# Patient Record
Sex: Male | Born: 1955 | Race: White | Hispanic: No | Marital: Married | Smoking: Former smoker
Health system: Southern US, Community
[De-identification: ages and names within clinical notes are randomized; demographics above are authoritative.]

## PROBLEM LIST (undated history)

## (undated) DIAGNOSIS — Z720 Tobacco use: Secondary | ICD-10-CM

## (undated) DIAGNOSIS — E785 Hyperlipidemia, unspecified: Secondary | ICD-10-CM

## (undated) DIAGNOSIS — K219 Gastro-esophageal reflux disease without esophagitis: Secondary | ICD-10-CM

## (undated) DIAGNOSIS — J449 Chronic obstructive pulmonary disease, unspecified: Secondary | ICD-10-CM

## (undated) DIAGNOSIS — R0600 Dyspnea, unspecified: Secondary | ICD-10-CM

## (undated) DIAGNOSIS — Z9981 Dependence on supplemental oxygen: Secondary | ICD-10-CM

## (undated) DIAGNOSIS — I2699 Other pulmonary embolism without acute cor pulmonale: Secondary | ICD-10-CM

## (undated) DIAGNOSIS — R519 Headache, unspecified: Secondary | ICD-10-CM

## (undated) DIAGNOSIS — N189 Chronic kidney disease, unspecified: Secondary | ICD-10-CM

## (undated) DIAGNOSIS — C801 Malignant (primary) neoplasm, unspecified: Secondary | ICD-10-CM

## (undated) DIAGNOSIS — J189 Pneumonia, unspecified organism: Secondary | ICD-10-CM

## (undated) HISTORY — PX: VASECTOMY: SHX75

## (undated) HISTORY — PX: COLONOSCOPY: SHX174

## (undated) HISTORY — DX: Tobacco use: Z72.0

## (undated) HISTORY — DX: Pneumonia, unspecified organism: J18.9

## (undated) HISTORY — PX: HAND SURGERY: SHX662

---

## 1997-08-24 ENCOUNTER — Other Ambulatory Visit: Admission: RE | Admit: 1997-08-24 | Discharge: 1997-08-24 | Payer: Self-pay | Admitting: Dermatology

## 2001-05-26 ENCOUNTER — Encounter: Payer: Self-pay | Admitting: Emergency Medicine

## 2001-05-27 ENCOUNTER — Ambulatory Visit (HOSPITAL_BASED_OUTPATIENT_CLINIC_OR_DEPARTMENT_OTHER): Admission: RE | Admit: 2001-05-27 | Discharge: 2001-05-28 | Payer: Self-pay | Admitting: Orthopedic Surgery

## 2001-12-14 ENCOUNTER — Emergency Department (HOSPITAL_COMMUNITY): Admission: EM | Admit: 2001-12-14 | Discharge: 2001-12-15 | Payer: Self-pay | Admitting: Emergency Medicine

## 2008-11-26 ENCOUNTER — Ambulatory Visit (HOSPITAL_COMMUNITY): Admission: RE | Admit: 2008-11-26 | Discharge: 2008-11-26 | Payer: Self-pay | Admitting: Sports Medicine

## 2008-12-13 ENCOUNTER — Encounter: Admission: RE | Admit: 2008-12-13 | Discharge: 2008-12-13 | Payer: Self-pay | Admitting: Family Medicine

## 2010-06-20 NOTE — Op Note (Signed)
[facility]  Patient:    Philip [name], Philip [name] Visit Number: [id number] MRN: [id number]          Service Type: EMS Location: ED Attending Physician:  Donnetta Hutching Dictated by:   Nicki Reaper, M.D. Proc. Date: 05/27/01 Admit Date:  05/26/2001 Discharge Date: 05/26/2001   CC:         (2)   Operative Report  PREOPERATIVE DIAGNOSIS: Flexor sheath infection, right index finger.  POSTOPERATIVE DIAGNOSIS: Flexor sheath infection, right index finger.  OPERATION/PROCEDURE: Incision and drainage of flexor sheath, with placement of irrigation catheter, right index finger.  SURGEON: Nicki Reaper, M.D.  ASSISTANT: Joaquin Courts, R.N.  ANESTHESIA: Forearm-based IV regional.  ANESTHESIOLOGIST: Guadalupe Maple, M.D.  HISTORY: The patient is a 55 year old male, who suffered a puncture wound to the PIP joint area of his index finger with a piece of glass.  He has had swelling, pain along the flexor sheath.  Kanavels signs are mildly present. This has not responded to antibiotics.  PROCEDURE: The patient was brought to the operating room, where forearm-based IV regional anesthetic was carried out without difficulty.  He was prepped and draped using Betadine scrubbing solution with the right arm free.  A transverse incision was made over the puncture area and carried down through subcutaneous tissue.  The flexor sheath was noted to be penetrated.  The injury through the flexor tendon profundus was identified.  No foreign material was identified.  This was a longitudinal laceration.  Cultures were taken.  There was a moderate amount of fluid present.  An incision was then made at the distal interphalangeal joint crease and carried down to the flexor sheath.  No significant fluid was identified.  A catheter was placed and irrigated.  A separate incision was then made over the metacarpophalangeal joint and carried down to the A1 pulley.  A small incision was  made to the proximal aspect of the A1 pulley.  An infant feeding tube was then passed up to the mid A2 pulley area and the entire sheath was irrigated clear. Significant fluid was present in the A1 pulley area.  This was irrigated and debrided.  The catheter was left in place.  The wounds were packed open with gauze.  A sterile compressive dressing and splint were applied.  The patient tolerated the procedure well and was taken to the recovery room for observation in satisfactory condition.  He is admitted for overnight stay for antibiotics and pain control along with irrigation, and he will be discharged on Vicodin and Keflex. Dictated by:   Nicki Reaper, M.D. Attending Physician:  Donnetta Hutching DD:  05/27/01 TD:  05/28/01 Job: [id number]

## 2018-05-04 ENCOUNTER — Other Ambulatory Visit: Payer: Self-pay | Admitting: Family Medicine

## 2018-05-04 DIAGNOSIS — Z720 Tobacco use: Secondary | ICD-10-CM

## 2018-11-10 ENCOUNTER — Other Ambulatory Visit: Payer: Self-pay | Admitting: *Deleted

## 2018-11-10 ENCOUNTER — Encounter: Payer: Self-pay | Admitting: *Deleted

## 2018-11-10 DIAGNOSIS — F17210 Nicotine dependence, cigarettes, uncomplicated: Secondary | ICD-10-CM

## 2018-11-10 DIAGNOSIS — Z122 Encounter for screening for malignant neoplasm of respiratory organs: Secondary | ICD-10-CM

## 2018-12-09 ENCOUNTER — Ambulatory Visit (INDEPENDENT_AMBULATORY_CARE_PROVIDER_SITE_OTHER)
Admission: RE | Admit: 2018-12-09 | Discharge: 2018-12-09 | Disposition: A | Discharge status: Home / Self Care | Payer: Managed Care, Other (non HMO) | Source: Ambulatory Visit | Attending: Acute Care | Admitting: Acute Care

## 2018-12-09 ENCOUNTER — Ambulatory Visit (INDEPENDENT_AMBULATORY_CARE_PROVIDER_SITE_OTHER): Payer: Managed Care, Other (non HMO) | Admitting: Primary Care

## 2018-12-09 ENCOUNTER — Other Ambulatory Visit: Payer: Self-pay

## 2018-12-09 ENCOUNTER — Encounter: Payer: Self-pay | Admitting: Primary Care

## 2018-12-09 VITALS — BP 124/64 | HR 60 | Ht 66.0 in | Wt 183.0 lb

## 2018-12-09 DIAGNOSIS — F17210 Nicotine dependence, cigarettes, uncomplicated: Secondary | ICD-10-CM

## 2018-12-09 DIAGNOSIS — Z87891 Personal history of nicotine dependence: Secondary | ICD-10-CM | POA: Diagnosis not present

## 2018-12-09 DIAGNOSIS — Z122 Encounter for screening for malignant neoplasm of respiratory organs: Secondary | ICD-10-CM

## 2018-12-09 NOTE — Progress Notes (Signed)
Shared Decision Making Visit Lung Cancer Screening Program [phone])   Eligibility:  Age 63 y.o.  Pack Years Smoking History Calculation 96 (# packs/per year x # years smoked)  Recent History of coughing up blood  no  Unexplained weight loss? no ( >Than 15 pounds within the last 6 months )  Prior History Lung / other cancer no (Diagnosis within the last 5 years already requiring surveillance chest CT Scans).  Smoking Status Current Smoker  Former Smokers: Years since quit: n/a  Quit Date: n/a  Visit Components:  Discussion included one or more decision making aids. yes  Discussion included risk/benefits of screening. yes  Discussion included potential follow up diagnostic testing for abnormal scans. yes  Discussion included meaning and risk of over diagnosis. yes  Discussion included meaning and risk of False Positives. yes  Discussion included meaning of total radiation exposure. yes  Counseling Included:  Importance of adherence to annual lung cancer LDCT screening. yes  Impact of comorbidities on ability to participate in the program. yes  Ability and willingness to under diagnostic treatment. yes  Smoking Cessation Counseling:  Current Smokers:   Discussed importance of smoking cessation. yes  Information about tobacco cessation classes and interventions provided to patient. yes  Patient provided with "ticket" for LDCT Scan. yes  Symptomatic Patient. No  Diagnosis Code: Tobacco Use Z72.0  Asymptomatic Patient yes  Counseling (Intermediate counseling: > three minutes counseling) Q1194  Former Smokers:   Discussed the importance of maintaining cigarette abstinence. yes  Diagnosis Code: Personal History of Nicotine Dependence. R74.081  Information about tobacco cessation classes and interventions provided to patient. Yes  Patient provided with "ticket" for LDCT Scan. yes  Written Order for Lung Cancer Screening with LDCT placed in Epic. Yes (CT  Chest Lung Cancer Screening Low Dose W/O CM) [id number] Z12.2-Screening of respiratory organs Z87.891-Personal history of nicotine dependence  BP 124/64 (BP Location: Left Arm, Patient Position: Sitting, Cuff Size: Normal)   Pulse 60   Ht 5\' 6"  (1.676 m)   Wt 183 lb (83 kg)   SpO2 98% Comment: room air  BMI 29.54 kg/m   I have spent 25 minutes of face to face time with Mr. Chaddock discussing the risks and benefits of lung cancer screening. We viewed a power point together that explained in detail the above noted topics. We paused at intervals to allow for questions to be asked and answered to ensure understanding. We discussed that the single most powerful action that he can take to decrease his risk of developing lung cancer is to quit smoking. We discussed whether or not he is ready to commit to setting a quit date. We discussed options for tools to aid in quitting smoking including nicotine replacement therapy, non-nicotine medications, support groups, Quit Smart classes, and behavior modification. We discussed that often times setting smaller, more achievable goals, such as eliminating 1 cigarette a day for a week and then 2 cigarettes a day for a week can be helpful in slowly decreasing the number of cigarettes smoked. This allows for a sense of accomplishment as well as providing a clinical benefit. I gave him the " Be Stronger Than Your Excuses" card with contact information for community resources, classes, free nicotine replacement therapy, and access to mobile apps, text messaging, and on-line smoking cessation help. I have also given him Eric Form, NP card and contact information in the event he needs to contact her. We discussed the time and location of the scan, and that either  Doroteo Glassman RN or Judson Roch will call with the results within 24-48 hours of receiving them. I have offered him a copy of the power point we viewed  as a resource in the event they need reinforcement of the concepts we  discussed today in the office. The patient verbalized understanding of all of  the above and had no further questions upon leaving the office. They have Sarah's contact information in the event they have any further questions.   I spent 3 minutes counseling on smoking cessation and the health risks of continued tobacco abuse.   Martyn Ehrich, NP

## 2018-12-09 NOTE — Patient Instructions (Signed)
Thank you for participating in the [facility]. It was our pleasure to meet you today. We will call you with the results of your scan within the next few days. Your scan will be assigned a Lung RADS category score by the physicians reading the scans.  This Lung RADS score determines follow up scanning.  See below for description of categories, and follow up screening recommendations. We will be in touch to schedule your follow up screening annually or based on recommendations of our providers. We will fax a copy of your scan results to your Primary Care Physician, or the physician who referred you to the program, to ensure they have the results. Please call the office if you have any questions or concerns regarding your scanning experience or results.  Our office number is [phone]. Please speak with Denise Phelps, RN. She is our Lung Cancer Screening RN. If she is unavailable when you call, please have the office staff send her a message. She will return your call at her earliest convenience. Remember, if your scan is normal, we will scan you annually as long as you continue to meet the criteria for the program. (Age 55-77, Current smoker or smoker who has quit within the last 15 years). If you are a smoker, remember, quitting is the single most powerful action that you can take to decrease your risk of lung cancer and other pulmonary, breathing related problems. We know quitting is hard, and we are here to help.  Please let us know if there is anything we can do to help you meet your goal of quitting. If you are a former smoker, congratulations. We are proud of you! Remain smoke free! Remember you can refer friends or family members through the number above.  We will screen them to make sure they meet criteria for the program. Thank you for helping us take better care of you by participating in Lung Screening.  Lung RADS Categories:  Lung RADS 1: no nodules  or definitely non-concerning nodules.  Recommendation is for a repeat annual scan in 12 months.  Lung RADS 2:  nodules that are non-concerning in appearance and behavior with a very low likelihood of becoming an active cancer. Recommendation is for a repeat annual scan in 12 months.  Lung RADS 3: nodules that are probably non-concerning , includes nodules with a low likelihood of becoming an active cancer.  Recommendation is for a 6-month repeat screening scan. Often noted after an upper respiratory illness. We will be in touch to make sure you have no questions, and to schedule your 6-month scan.  Lung RADS 4 A: nodules with concerning findings, recommendation is most often for a follow up scan in 3 months or additional testing based on our provider's assessment of the scan. We will be in touch to make sure you have no questions and to schedule the recommended 3 month follow up scan.  Lung RADS 4 B:  indicates findings that are concerning. We will be in touch with you to schedule additional diagnostic testing based on our provider's  assessment of the scan.   

## 2018-12-16 ENCOUNTER — Ambulatory Visit (INDEPENDENT_AMBULATORY_CARE_PROVIDER_SITE_OTHER): Payer: Managed Care, Other (non HMO) | Admitting: Pulmonary Disease

## 2018-12-16 ENCOUNTER — Encounter: Payer: Self-pay | Admitting: Pulmonary Disease

## 2018-12-16 ENCOUNTER — Other Ambulatory Visit: Payer: Self-pay

## 2018-12-16 VITALS — BP 100/70 | HR 103 | Temp 97.3°F | Ht 66.14 in | Wt 179.0 lb

## 2018-12-16 DIAGNOSIS — F17200 Nicotine dependence, unspecified, uncomplicated: Secondary | ICD-10-CM | POA: Diagnosis not present

## 2018-12-16 DIAGNOSIS — C3490 Malignant neoplasm of unspecified part of unspecified bronchus or lung: Secondary | ICD-10-CM

## 2018-12-16 DIAGNOSIS — Z720 Tobacco use: Secondary | ICD-10-CM | POA: Diagnosis not present

## 2018-12-16 DIAGNOSIS — R918 Other nonspecific abnormal finding of lung field: Secondary | ICD-10-CM

## 2018-12-16 NOTE — H&P (View-Only) (Signed)
Synopsis: Referred in November 2020 for abnormal CT imaging by [name], MD  Subjective:   PATIENT ID: Philip [name] GENDER: male DOB: 1955/03/15, MRN: [id number]  Chief Complaint  Patient presents with  . Follow-up    Patient comes in for a follow up for his CT scans. Patient says he has a productive cought sputum is clear and frothy     This is a 63 year old gentleman past medical history of tobacco abuse.  Presented initially to our clinic for enrollment in lung cancer screening program on December 09, 2018. He had a low-dose lung cancer screening CT completed on 12/09/2018 which revealed lung RADS 4B, evidence of emphysema, central perihilar large masslike lesion within the right upper lobe and right lower lobe and chronic scarring of the right lower lobe.  Today the patient complains predominantly of frothy sputum production.  He had denies hemoptysis.  He has been a longtime smoker since age 50.  Smoking 1 to 2 packs/day.  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  And has had dust and paint exposures.   Past Medical History:  Diagnosis Date  . Pneumonia   . Tobacco abuse      Family History  Problem Relation Age of Onset  . Cancer Father   . Stomach cancer Father      Past Surgical History:  Procedure Laterality Date  . COLONOSCOPY    . HAND SURGERY      Social History   Socioeconomic History  . Marital status: Single    Spouse name: Not on file  . Number of children: Not on file  . Years of education: Not on file  . Highest education level: Not on file  Occupational History  . Not on file  Social Needs  . Financial resource strain: Not on file  . Food insecurity    Worry: Not on file    Inability: Not on file  . Transportation needs    Medical: Not on file    Non-medical: Not on file  Tobacco Use  . Smoking status: Current Every Day Smoker    Packs/day: 2.00   Years: 45.00    Pack years: 90.00    Types: Cigarettes    Start date: 07/04/1970  . Smokeless tobacco: Former Systems developer    Types: Chew  Substance and Sexual Activity  . Alcohol use: Not on file  . Drug use: Not on file  . Sexual activity: Not on file  Lifestyle  . Physical activity    Days per week: Not on file    Minutes per session: Not on file  . Stress: Not on file  Relationships  . Social Herbalist on phone: Not on file    Gets together: Not on file    Attends religious service: Not on file    Active member of club or organization: Not on file    Attends meetings of clubs or organizations: Not on file    Relationship status: Not on file  . Intimate partner violence    Fear of current or ex partner: Not on file    Emotionally abused: Not on file    Physically abused: Not on file    Forced sexual activity: Not on file  Other Topics Concern  . Not on file  Social History Narrative  . Not on file     Allergies  Allergen Reactions  .  Norco [Hydrocodone-Acetaminophen] Other (See Comments)    Made pt feel jittery      Outpatient Medications Prior to Visit  Medication Sig Dispense Refill  . atorvastatin (LIPITOR) 20 MG tablet Take 20 mg by mouth daily.    . Multiple Vitamins-Minerals (MULTIVITAMIN WITH MINERALS) tablet Take 2 tablets by mouth 3 (three) times a week. Chewable    . sildenafil (REVATIO) 20 MG tablet TAKE 1 3 TABLETS AS NEEDED 30 MIN TO 4 HOURS PRIOR ONCE A DAY IF NEEDED     No facility-administered medications prior to visit.     Review of Systems  Constitutional: Negative for chills, fever, malaise/fatigue and weight loss.  HENT: Negative for hearing loss, sore throat and tinnitus.   Eyes: Negative for blurred vision and double vision.  Respiratory: Positive for cough. Negative for hemoptysis, sputum production, shortness of breath, wheezing and stridor.   Cardiovascular: Negative for chest pain, palpitations, orthopnea, leg swelling and PND.   Gastrointestinal: Negative for abdominal pain, constipation, diarrhea, heartburn, nausea and vomiting.  Genitourinary: Negative for dysuria, hematuria and urgency.  Musculoskeletal: Negative for joint pain and myalgias.  Skin: Negative for itching and rash.  Neurological: Negative for dizziness, tingling, weakness and headaches.  Endo/Heme/Allergies: Negative for environmental allergies. Does not bruise/bleed easily.  Psychiatric/Behavioral: Negative for depression. The patient is not nervous/anxious and does not have insomnia.   All other systems reviewed and are negative.    Objective:  Physical Exam Vitals signs reviewed.  Constitutional:      General: He is not in acute distress.    Appearance: He is well-developed.  HENT:     Head: Normocephalic and atraumatic.  Eyes:     General: No scleral icterus.    Conjunctiva/sclera: Conjunctivae normal.     Pupils: Pupils are equal, round, and reactive to light.  Neck:     Musculoskeletal: Neck supple.     Vascular: No JVD.     Trachea: No tracheal deviation.  Cardiovascular:     Rate and Rhythm: Normal rate and regular rhythm.     Heart sounds: Normal heart sounds. No murmur.  Pulmonary:     Effort: Pulmonary effort is normal. No tachypnea, accessory muscle usage or respiratory distress.     Breath sounds: No stridor. No wheezing, rhonchi or rales.     Comments: Diminished breath sounds bilaterally Abdominal:     General: Bowel sounds are normal. There is no distension.     Palpations: Abdomen is soft.     Tenderness: There is no abdominal tenderness.  Musculoskeletal:        General: No tenderness.  Lymphadenopathy:     Cervical: No cervical adenopathy.  Skin:    General: Skin is warm and dry.     Capillary Refill: Capillary refill takes less than 2 seconds.     Findings: No rash.  Neurological:     Mental Status: He is alert and oriented to person, place, and time.  Psychiatric:        Behavior: Behavior normal.       Vitals:   12/16/18 1519  BP: 100/70  Pulse: (!) 103  Temp: (!) 97.3 F (36.3 C)  TempSrc: Temporal  SpO2: 95%  Weight: 179 lb (81.2 kg)  Height: 5' 6.14" (1.68 m)   95% on  RA BMI Readings from Last 3 Encounters:  12/16/18 28.77 kg/m  12/09/18 29.54 kg/m   Wt Readings from Last 3 Encounters:  12/16/18 179 lb (81.2 kg)  12/09/18 183 lb (83 kg)  CBC No results found for: WBC, RBC, HGB, HCT, PLT, MCV, MCH, MCHC, RDW, LYMPHSABS, MONOABS, EOSABS, BASOSABS   Chest Imaging: 12/09/2018 LDCT: Lung RADS 4B, lymphadenopathy, right perihilar mass.  Pulmonary Functions Testing Results: No flowsheet data found.  FeNO: None   Pathology: none  Echocardiogram: none  Heart Catheterization: none    Assessment & Plan:     ICD-10-CM   1. Lung mass  R91.8 NM PET - Initial Skull Base To Thigh  2. Smoker  F17.200   3. Tobacco abuse  Z72.0   4. Malignant neoplasm of unspecified part of unspecified bronchus or lung ([facility])  C34.90 NM PET - Initial Skull Base To Thigh    Discussion:  This is a 63 year old gentleman with a right perihilar infiltrative appearing mass complaining of significant frothy sputum production.  Patient denies hemoptysis.  He is a longtime smoker.  I think the best next step in evaluation will include bronchoscopy with biopsy.  Plan: Today in the office we discussed the risk benefits and alternatives of proceeding with bronchoscopy. We will plan to schedule the patient on Thursday, 12/22/2018 at [facility] operating room for video bronchoscopy with endobronchial ultrasound, mediastinal lymph node sampling as well as transbronchial biopsies to the perihilar infiltrative mass seen on imaging.  We discussed the risks of bronchoscopy to include bleeding as well as pneumothorax.  Patient is agreeable to proceed with tissue sampling.  He is not on any blood thinners or antiplatelets.  We will help arrange his preoperative Covid testing.  As well as his procedure  for Thursday morning.  Patient was also counseled today on smoking cessation.  Greater than 50% of this patient's 60-minute office visit was spent face-to-face discussing the above recommendations and treatment plan as well as risk benefits and alternatives of proceeding with an invasive diagnostic procedure as described above.  We also reviewed the patient's CT imaging today in the office.   Current Outpatient Medications:  .  atorvastatin (LIPITOR) 20 MG tablet, Take 20 mg by mouth daily., Disp: , Rfl:  .  Multiple Vitamins-Minerals (MULTIVITAMIN WITH MINERALS) tablet, Take 2 tablets by mouth 3 (three) times a week. Chewable, Disp: , Rfl:  .  sildenafil (REVATIO) 20 MG tablet, TAKE 1 3 TABLETS AS NEEDED 30 MIN TO 4 HOURS PRIOR ONCE A DAY IF NEEDED, Disp: , Rfl:    Garner Nash, DO [facility] 12/16/2018 3:33 PM

## 2018-12-16 NOTE — Patient Instructions (Signed)
Thank you for visiting Dr. Valeta Harms at [facility]. Today we recommend the following:  Orders Placed This Encounter  Procedures  . NM PET - Initial Skull Base To Thigh  . Ambulatory referral to Pulmonology  . FULL - Pulmonary Function Test ([facility])   Planned bronchoscopy on Thursday 12/22/2018 at [facility]. Please be NPO at [place] the night before.  Nothing by mouth the night before past midnight.   Return in about 4 weeks (around 01/13/2019) for with Dr. Valeta Harms.    Please do your part to reduce the spread of COVID-19.

## 2018-12-16 NOTE — H&P (View-Only) (Signed)
Synopsis: Referred in November 2020 for abnormal CT imaging by [name], MD  Subjective:   PATIENT ID: Philip [name] GENDER: male DOB: 31-May-1955, MRN: [id number]  Chief Complaint  Patient presents with  . Follow-up    Patient comes in for a follow up for his CT scans. Patient says he has a productive cought sputum is clear and frothy     This is a 63 year old gentleman past medical history of tobacco abuse.  Presented initially to our clinic for enrollment in lung cancer screening program on December 09, 2018. He had a low-dose lung cancer screening CT completed on 12/09/2018 which revealed lung RADS 4B, evidence of emphysema, central perihilar large masslike lesion within the right upper lobe and right lower lobe and chronic scarring of the right lower lobe.  Today the patient complains predominantly of frothy sputum production.  He had denies hemoptysis.  He has been a longtime smoker since age 88.  Smoking 1 to 2 packs/day.  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  And has had dust and paint exposures.   Past Medical History:  Diagnosis Date  . Pneumonia   . Tobacco abuse      Family History  Problem Relation Age of Onset  . Cancer Father   . Stomach cancer Father      Past Surgical History:  Procedure Laterality Date  . COLONOSCOPY    . HAND SURGERY      Social History   Socioeconomic History  . Marital status: Single    Spouse name: Not on file  . Number of children: Not on file  . Years of education: Not on file  . Highest education level: Not on file  Occupational History  . Not on file  Social Needs  . Financial resource strain: Not on file  . Food insecurity    Worry: Not on file    Inability: Not on file  . Transportation needs    Medical: Not on file    Non-medical: Not on file  Tobacco Use  . Smoking status: Current Every Day Smoker    Packs/day: 2.00   Years: 45.00    Pack years: 90.00    Types: Cigarettes    Start date: 07/04/1970  . Smokeless tobacco: Former Systems developer    Types: Chew  Substance and Sexual Activity  . Alcohol use: Not on file  . Drug use: Not on file  . Sexual activity: Not on file  Lifestyle  . Physical activity    Days per week: Not on file    Minutes per session: Not on file  . Stress: Not on file  Relationships  . Social Herbalist on phone: Not on file    Gets together: Not on file    Attends religious service: Not on file    Active member of club or organization: Not on file    Attends meetings of clubs or organizations: Not on file    Relationship status: Not on file  . Intimate partner violence    Fear of current or ex partner: Not on file    Emotionally abused: Not on file    Physically abused: Not on file    Forced sexual activity: Not on file  Other Topics Concern  . Not on file  Social History Narrative  . Not on file     Allergies  Allergen Reactions  .  Norco [Hydrocodone-Acetaminophen] Other (See Comments)    Made pt feel jittery      Outpatient Medications Prior to Visit  Medication Sig Dispense Refill  . atorvastatin (LIPITOR) 20 MG tablet Take 20 mg by mouth daily.    . Multiple Vitamins-Minerals (MULTIVITAMIN WITH MINERALS) tablet Take 2 tablets by mouth 3 (three) times a week. Chewable    . sildenafil (REVATIO) 20 MG tablet TAKE 1 3 TABLETS AS NEEDED 30 MIN TO 4 HOURS PRIOR ONCE A DAY IF NEEDED     No facility-administered medications prior to visit.     Review of Systems  Constitutional: Negative for chills, fever, malaise/fatigue and weight loss.  HENT: Negative for hearing loss, sore throat and tinnitus.   Eyes: Negative for blurred vision and double vision.  Respiratory: Positive for cough. Negative for hemoptysis, sputum production, shortness of breath, wheezing and stridor.   Cardiovascular: Negative for chest pain, palpitations, orthopnea, leg swelling and PND.   Gastrointestinal: Negative for abdominal pain, constipation, diarrhea, heartburn, nausea and vomiting.  Genitourinary: Negative for dysuria, hematuria and urgency.  Musculoskeletal: Negative for joint pain and myalgias.  Skin: Negative for itching and rash.  Neurological: Negative for dizziness, tingling, weakness and headaches.  Endo/Heme/Allergies: Negative for environmental allergies. Does not bruise/bleed easily.  Psychiatric/Behavioral: Negative for depression. The patient is not nervous/anxious and does not have insomnia.   All other systems reviewed and are negative.    Objective:  Physical Exam Vitals signs reviewed.  Constitutional:      General: He is not in acute distress.    Appearance: He is well-developed.  HENT:     Head: Normocephalic and atraumatic.  Eyes:     General: No scleral icterus.    Conjunctiva/sclera: Conjunctivae normal.     Pupils: Pupils are equal, round, and reactive to light.  Neck:     Musculoskeletal: Neck supple.     Vascular: No JVD.     Trachea: No tracheal deviation.  Cardiovascular:     Rate and Rhythm: Normal rate and regular rhythm.     Heart sounds: Normal heart sounds. No murmur.  Pulmonary:     Effort: Pulmonary effort is normal. No tachypnea, accessory muscle usage or respiratory distress.     Breath sounds: No stridor. No wheezing, rhonchi or rales.     Comments: Diminished breath sounds bilaterally Abdominal:     General: Bowel sounds are normal. There is no distension.     Palpations: Abdomen is soft.     Tenderness: There is no abdominal tenderness.  Musculoskeletal:        General: No tenderness.  Lymphadenopathy:     Cervical: No cervical adenopathy.  Skin:    General: Skin is warm and dry.     Capillary Refill: Capillary refill takes less than 2 seconds.     Findings: No rash.  Neurological:     Mental Status: He is alert and oriented to person, place, and time.  Psychiatric:        Behavior: Behavior normal.       Vitals:   12/16/18 1519  BP: 100/70  Pulse: (!) 103  Temp: (!) 97.3 F (36.3 C)  TempSrc: Temporal  SpO2: 95%  Weight: 179 lb (81.2 kg)  Height: 5' 6.14" (1.68 m)   95% on  RA BMI Readings from Last 3 Encounters:  12/16/18 28.77 kg/m  12/09/18 29.54 kg/m   Wt Readings from Last 3 Encounters:  12/16/18 179 lb (81.2 kg)  12/09/18 183 lb (83 kg)  CBC No results found for: WBC, RBC, HGB, HCT, PLT, MCV, MCH, MCHC, RDW, LYMPHSABS, MONOABS, EOSABS, BASOSABS   Chest Imaging: 12/09/2018 LDCT: Lung RADS 4B, lymphadenopathy, right perihilar mass.  Pulmonary Functions Testing Results: No flowsheet data found.  FeNO: None   Pathology: none  Echocardiogram: none  Heart Catheterization: none    Assessment & Plan:     ICD-10-CM   1. Lung mass  R91.8 NM PET - Initial Skull Base To Thigh  2. Smoker  F17.200   3. Tobacco abuse  Z72.0   4. Malignant neoplasm of unspecified part of unspecified bronchus or lung ([place])  C34.90 NM PET - Initial Skull Base To Thigh    Discussion:  This is a 63 year old gentleman with a right perihilar infiltrative appearing mass complaining of significant frothy sputum production.  Patient denies hemoptysis.  He is a longtime smoker.  I think the best next step in evaluation will include bronchoscopy with biopsy.  Plan: Today in the office we discussed the risk benefits and alternatives of proceeding with bronchoscopy. We will plan to schedule the patient on Thursday, 12/22/2018 at [facility] operating room for video bronchoscopy with endobronchial ultrasound, mediastinal lymph node sampling as well as transbronchial biopsies to the perihilar infiltrative mass seen on imaging.  We discussed the risks of bronchoscopy to include bleeding as well as pneumothorax.  Patient is agreeable to proceed with tissue sampling.  He is not on any blood thinners or antiplatelets.  We will help arrange his preoperative Covid testing.  As well as his procedure  for Thursday morning.  Patient was also counseled today on smoking cessation.  Greater than 50% of this patient's 60-minute office visit was spent face-to-face discussing the above recommendations and treatment plan as well as risk benefits and alternatives of proceeding with an invasive diagnostic procedure as described above.  We also reviewed the patient's CT imaging today in the office.   Current Outpatient Medications:  .  atorvastatin (LIPITOR) 20 MG tablet, Take 20 mg by mouth daily., Disp: , Rfl:  .  Multiple Vitamins-Minerals (MULTIVITAMIN WITH MINERALS) tablet, Take 2 tablets by mouth 3 (three) times a week. Chewable, Disp: , Rfl:  .  sildenafil (REVATIO) 20 MG tablet, TAKE 1 3 TABLETS AS NEEDED 30 MIN TO 4 HOURS PRIOR ONCE A DAY IF NEEDED, Disp: , Rfl:    Garner Nash, DO [facility] 12/16/2018 3:33 PM

## 2018-12-16 NOTE — Progress Notes (Signed)
Synopsis: Referred in November 2020 for abnormal CT imaging by [name], MD  Subjective:   PATIENT ID: Philip [name] GENDER: male DOB: 1955-05-11, MRN: [id number]  Chief Complaint  Patient presents with  . Follow-up    Patient comes in for a follow up for his CT scans. Patient says he has a productive cought sputum is clear and frothy     This is a 63 year old gentleman past medical history of tobacco abuse.  Presented initially to our clinic for enrollment in lung cancer screening program on December 09, 2018. He had a low-dose lung cancer screening CT completed on 12/09/2018 which revealed lung RADS 4B, evidence of emphysema, central perihilar large masslike lesion within the right upper lobe and right lower lobe and chronic scarring of the right lower lobe.  Today the patient complains predominantly of frothy sputum production.  He had denies hemoptysis.  He has been a longtime smoker since age 78.  Smoking 1 to 2 packs/day.  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  And has had dust and paint exposures.   Past Medical History:  Diagnosis Date  . Pneumonia   . Tobacco abuse      Family History  Problem Relation Age of Onset  . Cancer Father   . Stomach cancer Father      Past Surgical History:  Procedure Laterality Date  . COLONOSCOPY    . HAND SURGERY      Social History   Socioeconomic History  . Marital status: Single    Spouse name: Not on file  . Number of children: Not on file  . Years of education: Not on file  . Highest education level: Not on file  Occupational History  . Not on file  Social Needs  . Financial resource strain: Not on file  . Food insecurity    Worry: Not on file    Inability: Not on file  . Transportation needs    Medical: Not on file    Non-medical: Not on file  Tobacco Use  . Smoking status: Current Every Day Smoker    Packs/day: 2.00   Years: 45.00    Pack years: 90.00    Types: Cigarettes    Start date: 07/04/1970  . Smokeless tobacco: Former Systems developer    Types: Chew  Substance and Sexual Activity  . Alcohol use: Not on file  . Drug use: Not on file  . Sexual activity: Not on file  Lifestyle  . Physical activity    Days per week: Not on file    Minutes per session: Not on file  . Stress: Not on file  Relationships  . Social Herbalist on phone: Not on file    Gets together: Not on file    Attends religious service: Not on file    Active member of club or organization: Not on file    Attends meetings of clubs or organizations: Not on file    Relationship status: Not on file  . Intimate partner violence    Fear of current or ex partner: Not on file    Emotionally abused: Not on file    Physically abused: Not on file    Forced sexual activity: Not on file  Other Topics Concern  . Not on file  Social History Narrative  . Not on file     Allergies  Allergen Reactions  .  Norco [Hydrocodone-Acetaminophen] Other (See Comments)    Made pt feel jittery      Outpatient Medications Prior to Visit  Medication Sig Dispense Refill  . atorvastatin (LIPITOR) 20 MG tablet Take 20 mg by mouth daily.    . Multiple Vitamins-Minerals (MULTIVITAMIN WITH MINERALS) tablet Take 2 tablets by mouth 3 (three) times a week. Chewable    . sildenafil (REVATIO) 20 MG tablet TAKE 1 3 TABLETS AS NEEDED 30 MIN TO 4 HOURS PRIOR ONCE A DAY IF NEEDED     No facility-administered medications prior to visit.     Review of Systems  Constitutional: Negative for chills, fever, malaise/fatigue and weight loss.  HENT: Negative for hearing loss, sore throat and tinnitus.   Eyes: Negative for blurred vision and double vision.  Respiratory: Positive for cough. Negative for hemoptysis, sputum production, shortness of breath, wheezing and stridor.   Cardiovascular: Negative for chest pain, palpitations, orthopnea, leg swelling and PND.   Gastrointestinal: Negative for abdominal pain, constipation, diarrhea, heartburn, nausea and vomiting.  Genitourinary: Negative for dysuria, hematuria and urgency.  Musculoskeletal: Negative for joint pain and myalgias.  Skin: Negative for itching and rash.  Neurological: Negative for dizziness, tingling, weakness and headaches.  Endo/Heme/Allergies: Negative for environmental allergies. Does not bruise/bleed easily.  Psychiatric/Behavioral: Negative for depression. The patient is not nervous/anxious and does not have insomnia.   All other systems reviewed and are negative.    Objective:  Physical Exam Vitals signs reviewed.  Constitutional:      General: He is not in acute distress.    Appearance: He is well-developed.  HENT:     Head: Normocephalic and atraumatic.  Eyes:     General: No scleral icterus.    Conjunctiva/sclera: Conjunctivae normal.     Pupils: Pupils are equal, round, and reactive to light.  Neck:     Musculoskeletal: Neck supple.     Vascular: No JVD.     Trachea: No tracheal deviation.  Cardiovascular:     Rate and Rhythm: Normal rate and regular rhythm.     Heart sounds: Normal heart sounds. No murmur.  Pulmonary:     Effort: Pulmonary effort is normal. No tachypnea, accessory muscle usage or respiratory distress.     Breath sounds: No stridor. No wheezing, rhonchi or rales.     Comments: Diminished breath sounds bilaterally Abdominal:     General: Bowel sounds are normal. There is no distension.     Palpations: Abdomen is soft.     Tenderness: There is no abdominal tenderness.  Musculoskeletal:        General: No tenderness.  Lymphadenopathy:     Cervical: No cervical adenopathy.  Skin:    General: Skin is warm and dry.     Capillary Refill: Capillary refill takes less than 2 seconds.     Findings: No rash.  Neurological:     Mental Status: He is alert and oriented to person, place, and time.  Psychiatric:        Behavior: Behavior normal.       Vitals:   12/16/18 1519  BP: 100/70  Pulse: (!) 103  Temp: (!) 97.3 F (36.3 C)  TempSrc: Temporal  SpO2: 95%  Weight: 179 lb (81.2 kg)  Height: 5' 6.14" (1.68 m)   95% on  RA BMI Readings from Last 3 Encounters:  12/16/18 28.77 kg/m  12/09/18 29.54 kg/m   Wt Readings from Last 3 Encounters:  12/16/18 179 lb (81.2 kg)  12/09/18 183 lb (83 kg)  CBC No results found for: WBC, RBC, HGB, HCT, PLT, MCV, MCH, MCHC, RDW, LYMPHSABS, MONOABS, EOSABS, BASOSABS   Chest Imaging: 12/09/2018 LDCT: Lung RADS 4B, lymphadenopathy, right perihilar mass.  Pulmonary Functions Testing Results: No flowsheet data found.  FeNO: None   Pathology: none  Echocardiogram: none  Heart Catheterization: none    Assessment & Plan:     ICD-10-CM   1. Lung mass  R91.8 NM PET - Initial Skull Base To Thigh  2. Smoker  F17.200   3. Tobacco abuse  Z72.0   4. Malignant neoplasm of unspecified part of unspecified bronchus or lung ([place])  C34.90 NM PET - Initial Skull Base To Thigh    Discussion:  This is a 63 year old gentleman with a right perihilar infiltrative appearing mass complaining of significant frothy sputum production.  Patient denies hemoptysis.  He is a longtime smoker.  I think the best next step in evaluation will include bronchoscopy with biopsy.  Plan: Today in the office we discussed the risk benefits and alternatives of proceeding with bronchoscopy. We will plan to schedule the patient on Thursday, 12/22/2018 at [facility] operating room for video bronchoscopy with endobronchial ultrasound, mediastinal lymph node sampling as well as transbronchial biopsies to the perihilar infiltrative mass seen on imaging.  We discussed the risks of bronchoscopy to include bleeding as well as pneumothorax.  Patient is agreeable to proceed with tissue sampling.  He is not on any blood thinners or antiplatelets.  We will help arrange his preoperative Covid testing.  As well as his procedure  for Thursday morning.  Patient was also counseled today on smoking cessation.  Greater than 50% of this patient's 60-minute office visit was spent face-to-face discussing the above recommendations and treatment plan as well as risk benefits and alternatives of proceeding with an invasive diagnostic procedure as described above.  We also reviewed the patient's CT imaging today in the office.   Current Outpatient Medications:  .  atorvastatin (LIPITOR) 20 MG tablet, Take 20 mg by mouth daily., Disp: , Rfl:  .  Multiple Vitamins-Minerals (MULTIVITAMIN WITH MINERALS) tablet, Take 2 tablets by mouth 3 (three) times a week. Chewable, Disp: , Rfl:  .  sildenafil (REVATIO) 20 MG tablet, TAKE 1 3 TABLETS AS NEEDED 30 MIN TO 4 HOURS PRIOR ONCE A DAY IF NEEDED, Disp: , Rfl:    Garner Nash, DO [facility] 12/16/2018 3:33 PM

## 2018-12-19 ENCOUNTER — Other Ambulatory Visit (HOSPITAL_COMMUNITY)
Admission: RE | Admit: 2018-12-19 | Discharge: 2018-12-19 | Disposition: A | Discharge status: Home / Self Care | Payer: Managed Care, Other (non HMO) | Source: Ambulatory Visit

## 2018-12-19 DIAGNOSIS — Z79899 Other long term (current) drug therapy: Secondary | ICD-10-CM | POA: Diagnosis not present

## 2018-12-19 DIAGNOSIS — E785 Hyperlipidemia, unspecified: Secondary | ICD-10-CM | POA: Diagnosis not present

## 2018-12-19 DIAGNOSIS — R918 Other nonspecific abnormal finding of lung field: Secondary | ICD-10-CM | POA: Diagnosis not present

## 2018-12-19 DIAGNOSIS — R590 Localized enlarged lymph nodes: Secondary | ICD-10-CM | POA: Diagnosis not present

## 2018-12-19 DIAGNOSIS — Z20828 Contact with and (suspected) exposure to other viral communicable diseases: Secondary | ICD-10-CM | POA: Diagnosis not present

## 2018-12-19 DIAGNOSIS — F17210 Nicotine dependence, cigarettes, uncomplicated: Secondary | ICD-10-CM | POA: Diagnosis not present

## 2018-12-19 DIAGNOSIS — J439 Emphysema, unspecified: Secondary | ICD-10-CM | POA: Diagnosis not present

## 2018-12-19 DIAGNOSIS — Z885 Allergy status to narcotic agent status: Secondary | ICD-10-CM | POA: Diagnosis not present

## 2018-12-20 ENCOUNTER — Telehealth: Payer: Self-pay | Admitting: Pulmonary Disease

## 2018-12-20 ENCOUNTER — Inpatient Hospital Stay (HOSPITAL_COMMUNITY): Admission: RE | Admit: 2018-12-20 | Payer: Managed Care, Other (non HMO) | Source: Ambulatory Visit

## 2018-12-20 LAB — SARS CORONAVIRUS 2 (TAT 6-24 HRS): SARS Coronavirus 2: NEGATIVE

## 2018-12-20 NOTE — Telephone Encounter (Signed)
I have spoken to [facility] in [place] pt. I talked to [employer] who deals with pre-procedure testing  Pt did go for his test yesterday.  Lindsi has set pt up to go back today for testing and she was going to speak to him to make him aware. She states will be resulted by midnight tonight.   I spoke to [facility] again and she is adding pt back on her schedule for pft tomorrow.  Nothing further needed.

## 2018-12-21 ENCOUNTER — Encounter (HOSPITAL_COMMUNITY): Payer: Self-pay | Admitting: *Deleted

## 2018-12-21 ENCOUNTER — Telehealth: Payer: Self-pay | Admitting: Pulmonary Disease

## 2018-12-21 ENCOUNTER — Other Ambulatory Visit: Payer: Self-pay

## 2018-12-21 ENCOUNTER — Encounter (HOSPITAL_COMMUNITY): Payer: Managed Care, Other (non HMO)

## 2018-12-21 ENCOUNTER — Ambulatory Visit (HOSPITAL_COMMUNITY)
Admission: RE | Admit: 2018-12-21 | Discharge: 2018-12-21 | Disposition: A | Discharge status: Home / Self Care | Payer: Managed Care, Other (non HMO) | Source: Ambulatory Visit | Attending: Pulmonary Disease | Admitting: Pulmonary Disease

## 2018-12-21 DIAGNOSIS — R918 Other nonspecific abnormal finding of lung field: Secondary | ICD-10-CM

## 2018-12-21 LAB — PULMONARY FUNCTION TEST
DL/VA % pred: 79 %
DL/VA: 3.39 ml/min/mmHg/L
DLCO unc % pred: 64 %
DLCO unc: 15.47 ml/min/mmHg
FEF 25-75 Post: 1.67 L/sec
FEF 25-75 Pre: 1.18 L/sec
FEF2575-%Change-Post: 42 %
FEF2575-%Pred-Post: 67 %
FEF2575-%Pred-Pre: 47 %
FEV1-%Change-Post: 10 %
FEV1-%Pred-Post: 75 %
FEV1-%Pred-Pre: 68 %
FEV1-Post: 2.28 L
FEV1-Pre: 2.07 L
FEV1FVC-%Change-Post: 4 %
FEV1FVC-%Pred-Pre: 89 %
FEV6-%Change-Post: 7 %
FEV6-%Pred-Post: 84 %
FEV6-%Pred-Pre: 78 %
FEV6-Post: 3.21 L
FEV6-Pre: 3 L
FEV6FVC-%Change-Post: 1 %
FEV6FVC-%Pred-Post: 104 %
FEV6FVC-%Pred-Pre: 103 %
FVC-%Change-Post: 5 %
FVC-%Pred-Post: 80 %
FVC-%Pred-Pre: 76 %
FVC-Post: 3.23 L
FVC-Pre: 3.07 L
Post FEV1/FVC ratio: 71 %
Post FEV6/FVC ratio: 99 %
Pre FEV1/FVC ratio: 67 %
Pre FEV6/FVC Ratio: 98 %
RV % pred: 104 %
RV: 2.18 L
TLC % pred: 86 %
TLC: 5.35 L

## 2018-12-21 MED ORDER — ALBUTEROL SULFATE (2.5 MG/3ML) 0.083% IN NEBU
2.5000 mg | INHALATION_SOLUTION | Freq: Once | RESPIRATORY_TRACT | Status: AC
  Administered 2018-12-21: 2.5 mg via RESPIRATORY_TRACT

## 2018-12-21 NOTE — Telephone Encounter (Signed)
This is the pt that they couldn't find his covid test from Monday and I had covid test on the brain instead of [facility]ervice [facility].  I called & spoke to [place] and she states she called [employer] & no auth on file for [employer].  Will ask [place] about this.

## 2018-12-21 NOTE — Telephone Encounter (Signed)
Pet is still pending [facility] [place]

## 2018-12-21 NOTE — Progress Notes (Signed)
Patient denies shortness of breath, fever, cough and chest pain.  PCP - Dr [name]  Cardiologist - denies  Chest x-ray - denies EKG - denies Stress Test - denies ECHO - denies Cardiac Cath - denies  Anesthesia:  No  Coronavirus Screening Covid test on 12/20/18 was negative.  Patient verbalized understanding of instructions that were given via phone.

## 2018-12-21 NOTE — Telephone Encounter (Signed)
I am calling preservice center.

## 2018-12-22 ENCOUNTER — Ambulatory Visit (HOSPITAL_COMMUNITY)
Admission: RE | Admit: 2018-12-22 | Discharge: 2018-12-22 | Disposition: A | Discharge status: Home / Self Care | Payer: Managed Care, Other (non HMO) | Attending: Pulmonary Disease | Admitting: Pulmonary Disease

## 2018-12-22 ENCOUNTER — Encounter (HOSPITAL_COMMUNITY)
Admission: RE | Disposition: A | Discharge status: Home / Self Care | Payer: Self-pay | Source: Home / Self Care | Attending: Pulmonary Disease

## 2018-12-22 ENCOUNTER — Encounter (HOSPITAL_COMMUNITY): Payer: Self-pay

## 2018-12-22 ENCOUNTER — Inpatient Hospital Stay (HOSPITAL_COMMUNITY): Payer: Managed Care, Other (non HMO)

## 2018-12-22 ENCOUNTER — Other Ambulatory Visit: Payer: Self-pay

## 2018-12-22 ENCOUNTER — Inpatient Hospital Stay (HOSPITAL_COMMUNITY): Payer: Managed Care, Other (non HMO) | Admitting: Anesthesiology

## 2018-12-22 DIAGNOSIS — E785 Hyperlipidemia, unspecified: Secondary | ICD-10-CM | POA: Insufficient documentation

## 2018-12-22 DIAGNOSIS — Z20828 Contact with and (suspected) exposure to other viral communicable diseases: Secondary | ICD-10-CM | POA: Insufficient documentation

## 2018-12-22 DIAGNOSIS — Z419 Encounter for procedure for purposes other than remedying health state, unspecified: Secondary | ICD-10-CM

## 2018-12-22 DIAGNOSIS — Z79899 Other long term (current) drug therapy: Secondary | ICD-10-CM | POA: Insufficient documentation

## 2018-12-22 DIAGNOSIS — Z9889 Other specified postprocedural states: Secondary | ICD-10-CM | POA: Diagnosis not present

## 2018-12-22 DIAGNOSIS — J439 Emphysema, unspecified: Secondary | ICD-10-CM | POA: Insufficient documentation

## 2018-12-22 DIAGNOSIS — Z885 Allergy status to narcotic agent status: Secondary | ICD-10-CM | POA: Insufficient documentation

## 2018-12-22 DIAGNOSIS — R918 Other nonspecific abnormal finding of lung field: Secondary | ICD-10-CM | POA: Diagnosis present

## 2018-12-22 DIAGNOSIS — F17210 Nicotine dependence, cigarettes, uncomplicated: Secondary | ICD-10-CM | POA: Insufficient documentation

## 2018-12-22 DIAGNOSIS — R590 Localized enlarged lymph nodes: Secondary | ICD-10-CM | POA: Insufficient documentation

## 2018-12-22 HISTORY — PX: VIDEO BRONCHOSCOPY WITH ENDOBRONCHIAL ULTRASOUND: SHX6177

## 2018-12-22 HISTORY — DX: Hyperlipidemia, unspecified: E78.5

## 2018-12-22 HISTORY — PX: VIDEO BRONCHOSCOPY WITH RADIAL ENDOBRONCHIAL ULTRASOUND: SHX6849

## 2018-12-22 LAB — CBC
HCT: 47.7 % (ref 39.0–52.0)
Hemoglobin: 16.1 g/dL (ref 13.0–17.0)
MCH: 32.1 pg (ref 26.0–34.0)
MCHC: 33.8 g/dL (ref 30.0–36.0)
MCV: 95.2 fL (ref 80.0–100.0)
Platelets: 257 10*3/uL (ref 150–400)
RBC: 5.01 MIL/uL (ref 4.22–5.81)
RDW: 12.4 % (ref 11.5–15.5)
WBC: 7.8 10*3/uL (ref 4.0–10.5)
nRBC: 0 % (ref 0.0–0.2)

## 2018-12-22 LAB — COMPREHENSIVE METABOLIC PANEL
ALT: 22 U/L (ref 0–44)
AST: 30 U/L (ref 15–41)
Albumin: 3.6 g/dL (ref 3.5–5.0)
Alkaline Phosphatase: 66 U/L (ref 38–126)
Anion gap: 9 (ref 5–15)
BUN: 18 mg/dL (ref 8–23)
CO2: 21 mmol/L — ABNORMAL LOW (ref 22–32)
Calcium: 9 mg/dL (ref 8.9–10.3)
Chloride: 107 mmol/L (ref 98–111)
Creatinine, Ser: 0.95 mg/dL (ref 0.61–1.24)
GFR calc Af Amer: >60 mL/min (ref >60)
GFR calc non Af Amer: >60 mL/min (ref >60)
Glucose, Bld: 108 mg/dL — ABNORMAL HIGH (ref 70–99)
Potassium: 5.2 mmol/L — ABNORMAL HIGH (ref 3.5–5.1)
Sodium: 137 mmol/L (ref 135–145)
Total Bilirubin: 1 mg/dL (ref 0.3–1.2)
Total Protein: 6.5 g/dL (ref 6.5–8.1)

## 2018-12-22 LAB — PROTIME-INR
INR: 0.9 (ref 0.8–1.2)
Prothrombin Time: 12.3 seconds (ref 11.4–15.2)

## 2018-12-22 LAB — APTT: aPTT: 32 seconds (ref 24–36)

## 2018-12-22 SURGERY — BRONCHOSCOPY, WITH EBUS
Anesthesia: General

## 2018-12-22 MED ORDER — PHENYLEPHRINE 40 MCG/ML (10ML) SYRINGE FOR IV PUSH (FOR BLOOD PRESSURE SUPPORT)
PREFILLED_SYRINGE | INTRAVENOUS | Status: AC
  Filled 2018-12-22: qty 30

## 2018-12-22 MED ORDER — ROCURONIUM BROMIDE 50 MG/5ML IV SOSY
PREFILLED_SYRINGE | INTRAVENOUS | Status: DC | PRN
  Administered 2018-12-22 (×2): 20 mg via INTRAVENOUS
  Administered 2018-12-22: 40 mg via INTRAVENOUS

## 2018-12-22 MED ORDER — PROPOFOL 10 MG/ML IV BOLUS
INTRAVENOUS | Status: DC | PRN
  Administered 2018-12-22: 20 mg via INTRAVENOUS
  Administered 2018-12-22 (×3): 50 mg via INTRAVENOUS

## 2018-12-22 MED ORDER — 0.9 % SODIUM CHLORIDE (POUR BTL) OPTIME
TOPICAL | Status: DC | PRN
  Administered 2018-12-22: 1000 mL

## 2018-12-22 MED ORDER — KETOROLAC TROMETHAMINE 30 MG/ML IJ SOLN: 30.0000 mg | Freq: Once | INTRAMUSCULAR | Status: DC | PRN

## 2018-12-22 MED ORDER — FENTANYL CITRATE (PF) 250 MCG/5ML IJ SOLN
INTRAMUSCULAR | Status: AC
  Filled 2018-12-22: qty 5

## 2018-12-22 MED ORDER — ONDANSETRON HCL 4 MG/2ML IJ SOLN
INTRAMUSCULAR | Status: AC
  Filled 2018-12-22: qty 2

## 2018-12-22 MED ORDER — PROMETHAZINE HCL 25 MG/ML IJ SOLN: 6.2500 mg | INTRAMUSCULAR | Status: DC | PRN

## 2018-12-22 MED ORDER — EPHEDRINE SULFATE 50 MG/ML IJ SOLN
INTRAMUSCULAR | Status: DC | PRN
  Administered 2018-12-22: 10 mg via INTRAVENOUS

## 2018-12-22 MED ORDER — PROPOFOL 10 MG/ML IV BOLUS
INTRAVENOUS | Status: AC
  Filled 2018-12-22: qty 20

## 2018-12-22 MED ORDER — MIDAZOLAM HCL 5 MG/5ML IJ SOLN
INTRAMUSCULAR | Status: DC | PRN
  Administered 2018-12-22: 2 mg via INTRAVENOUS

## 2018-12-22 MED ORDER — ROCURONIUM BROMIDE 10 MG/ML (PF) SYRINGE
PREFILLED_SYRINGE | INTRAVENOUS | Status: AC
  Filled 2018-12-22: qty 10

## 2018-12-22 MED ORDER — HYDROMORPHONE HCL 1 MG/ML IJ SOLN: 0.2500 mg | INTRAMUSCULAR | Status: DC | PRN

## 2018-12-22 MED ORDER — LIDOCAINE 2% (20 MG/ML) 5 ML SYRINGE
INTRAMUSCULAR | Status: AC
  Filled 2018-12-22: qty 5

## 2018-12-22 MED ORDER — PHENYLEPHRINE 40 MCG/ML (10ML) SYRINGE FOR IV PUSH (FOR BLOOD PRESSURE SUPPORT)
PREFILLED_SYRINGE | INTRAVENOUS | Status: AC
  Filled 2018-12-22: qty 10

## 2018-12-22 MED ORDER — LACTATED RINGERS IV SOLN
INTRAVENOUS | Status: DC
  Administered 2018-12-22 (×2): via INTRAVENOUS

## 2018-12-22 MED ORDER — MIDAZOLAM HCL 2 MG/2ML IJ SOLN
INTRAMUSCULAR | Status: AC
  Filled 2018-12-22: qty 2

## 2018-12-22 MED ORDER — ONDANSETRON HCL 4 MG/2ML IJ SOLN
INTRAMUSCULAR | Status: DC | PRN
  Administered 2018-12-22: 4 mg via INTRAVENOUS

## 2018-12-22 MED ORDER — PHENYLEPHRINE HCL (PRESSORS) 10 MG/ML IV SOLN
INTRAVENOUS | Status: DC | PRN
  Administered 2018-12-22: 120 ug via INTRAVENOUS
  Administered 2018-12-22: 40 ug via INTRAVENOUS
  Administered 2018-12-22 (×8): 80 ug via INTRAVENOUS

## 2018-12-22 MED ORDER — LIDOCAINE 2% (20 MG/ML) 5 ML SYRINGE
INTRAMUSCULAR | Status: DC | PRN
  Administered 2018-12-22: 40 mg via INTRAVENOUS
  Administered 2018-12-22: 60 mg via INTRAVENOUS

## 2018-12-22 MED ORDER — ACETAMINOPHEN 500 MG PO TABS
ORAL_TABLET | ORAL | Status: AC
  Filled 2018-12-22: qty 2

## 2018-12-22 MED ORDER — FENTANYL CITRATE (PF) 100 MCG/2ML IJ SOLN
INTRAMUSCULAR | Status: DC | PRN
  Administered 2018-12-22: 100 ug via INTRAVENOUS
  Administered 2018-12-22: 50 ug via INTRAVENOUS

## 2018-12-22 MED ORDER — ACETAMINOPHEN 500 MG PO TABS
1000.0000 mg | ORAL_TABLET | Freq: Once | ORAL | Status: AC
  Administered 2018-12-22: 1000 mg via ORAL

## 2018-12-22 MED ORDER — DEXAMETHASONE SODIUM PHOSPHATE 10 MG/ML IJ SOLN
INTRAMUSCULAR | Status: DC | PRN
  Administered 2018-12-22: 4 mg via INTRAVENOUS

## 2018-12-22 MED ORDER — SUGAMMADEX SODIUM 200 MG/2ML IV SOLN
INTRAVENOUS | Status: DC | PRN
  Administered 2018-12-22: 200 mg via INTRAVENOUS

## 2018-12-22 SURGICAL SUPPLY — 49 items
ADAPTER BRONCHOSCOPE OLYMPUS (ADAPTER) ×2 IMPLANT
ADAPTER VALVE BIOPSY EBUS (MISCELLANEOUS) IMPLANT
ADPTR VALVE BIOPSY EBUS (MISCELLANEOUS)
BRUSH CYTOL CELLEBRITY 1.5X140 (MISCELLANEOUS) ×2 IMPLANT
BRUSH SUPERTRAX BIOPSY (INSTRUMENTS) IMPLANT
BRUSH SUPERTRAX NDL-TIP CYTO (INSTRUMENTS) IMPLANT
CANISTER SUCT 3000ML PPV (MISCELLANEOUS) ×2 IMPLANT
CHANNEL WORK EXTEND EDGE 180 (KITS) IMPLANT
CHANNEL WORK EXTEND EDGE 90 (KITS) IMPLANT
CONT SPEC 4OZ CLIKSEAL STRL BL (MISCELLANEOUS) ×2 IMPLANT
COVER BACK TABLE 60X90IN (DRAPES) ×2 IMPLANT
COVER WAND RF STERILE (DRAPES) ×2 IMPLANT
FILTER STRAW FLUID ASPIR (MISCELLANEOUS) IMPLANT
FORCEPS BIOP RJ4 1.8 (CUTTING FORCEPS) ×2 IMPLANT
FORCEPS BIOP SUPERTRX PREMAR (INSTRUMENTS) IMPLANT
GAUZE SPONGE 4X4 12PLY STRL (GAUZE/BANDAGES/DRESSINGS) ×2 IMPLANT
GLOVE SURG SS PI 7.5 STRL IVOR (GLOVE) ×4 IMPLANT
GOWN STRL REUS W/ TWL LRG LVL3 (GOWN DISPOSABLE) ×2 IMPLANT
GOWN STRL REUS W/TWL LRG LVL3 (GOWN DISPOSABLE) ×2
KIT CLEAN ENDO COMPLIANCE (KITS) ×4 IMPLANT
KIT GUIDESHEATH 2.0 (SHEATH) ×2 IMPLANT
KIT LOCATABLE GUIDE (CANNULA) IMPLANT
KIT MARKER FIDUCIAL DELIVERY (KITS) IMPLANT
KIT PROCEDURE EDGE 180 (KITS) IMPLANT
KIT PROCEDURE EDGE 90 (KITS) IMPLANT
KIT TURNOVER KIT B (KITS) ×2 IMPLANT
MARKER SKIN DUAL TIP RULER LAB (MISCELLANEOUS) ×2 IMPLANT
NEEDLE ASPIRATION VIZISHOT 19G (NEEDLE) ×2 IMPLANT
NEEDLE ASPIRATION VIZISHOT 21G (NEEDLE) IMPLANT
NEEDLE SUPERTRX PREMARK BIOPSY (NEEDLE) IMPLANT
NS IRRIG 1000ML POUR BTL (IV SOLUTION) ×2 IMPLANT
OIL SILICONE PENTAX (PARTS (SERVICE/REPAIRS)) ×2 IMPLANT
PAD ARMBOARD 7.5X6 YLW CONV (MISCELLANEOUS) ×4 IMPLANT
PATCHES PATIENT (LABEL) ×6 IMPLANT
STOPCOCK 4 WAY LG BORE MALE ST (IV SETS) ×2 IMPLANT
SYR 20ML ECCENTRIC (SYRINGE) ×6 IMPLANT
SYR 20ML LL LF (SYRINGE) ×2 IMPLANT
SYR 3ML LL SCALE MARK (SYRINGE) IMPLANT
SYR 50ML SLIP (SYRINGE) ×2 IMPLANT
SYR 5ML LL (SYRINGE) ×2 IMPLANT
TOWEL GREEN STERILE FF (TOWEL DISPOSABLE) ×2 IMPLANT
TRAP SPECIMEN MUCOUS 40CC (MISCELLANEOUS) IMPLANT
TUBE CONNECTING 20X1/4 (TUBING) ×2 IMPLANT
TUBING EXTENTION W/L.L. (IV SETS) ×2 IMPLANT
UNDERPAD 30X30 (UNDERPADS AND DIAPERS) ×2 IMPLANT
VALVE BIOPSY  SINGLE USE (MISCELLANEOUS) ×1
VALVE BIOPSY SINGLE USE (MISCELLANEOUS) ×1 IMPLANT
VALVE SUCTION BRONCHIO DISP (MISCELLANEOUS) ×2 IMPLANT
WATER STERILE IRR 1000ML POUR (IV SOLUTION) ×2 IMPLANT

## 2018-12-22 NOTE — Anesthesia Postprocedure Evaluation (Signed)
Anesthesia Post Note  Patient: NAHEIM BURGEN  Procedure(s) Performed: VIDEO BRONCHOSCOPY WITH ENDOBRONCHIAL ULTRASOUND (N/A ) RADIAL ENDOBRONCHIAL ULTRASOUND (N/A )     Patient location during evaluation: PACU Anesthesia Type: General Level of consciousness: awake and alert Pain management: pain level controlled Vital Signs Assessment: post-procedure vital signs reviewed and stable Respiratory status: spontaneous breathing, nonlabored ventilation, respiratory function stable and patient connected to nasal cannula oxygen Cardiovascular status: blood pressure returned to baseline and stable Postop Assessment: no apparent nausea or vomiting Anesthetic complications: no    Last Vitals:  Vitals:   12/22/18 1402 12/22/18 1417  BP: 135/73 95/75  Pulse: 89 79  Resp: 16 19  Temp: (!) 36.1 C   SpO2: 91% 92%    Last Pain:  Vitals:   12/22/18 1417  TempSrc:   PainSc: 0-No pain                 Ryan P Ellender

## 2018-12-22 NOTE — Discharge Instructions (Signed)
Flexible Bronchoscopy, Care After This sheet gives you information about how to care for yourself after your test. Your doctor may also give you more specific instructions. If you have problems or questions, contact your doctor. Follow these instructions at home: Eating and drinking  The day after the test, go back to your normal diet. Driving  Do not drive for 24 hours if you were given a medicine to help you relax (sedative).  Do not drive or use heavy machinery while taking prescription pain medicine. General instructions   Take over-the-counter and prescription medicines only as told by your doctor.  Return to your normal activities as told. Ask what activities are safe for you.  Do not use any products that have nicotine or tobacco in them. This includes cigarettes and e-cigarettes. If you need help quitting, ask your doctor.  Keep all follow-up visits as told by your doctor. This is important. It is very important if you had a tissue sample (biopsy) taken. Get help right away if:  You have shortness of breath that gets worse.  You get light-headed.  You feel like you are going to pass out (faint).  You have chest pain.  You cough up: ? More than a little blood. ? More blood than before. Summary  Do not eat or drink anything (not even water) for 2 hours after your test, or until your numbing medicine wears off.  Do not use cigarettes. Do not use e-cigarettes.  Get help right away if you have chest pain. This information is not intended to replace advice given to you by your health care provider. Make sure you discuss any questions you have with your health care provider. Document Released: 11/16/2008 Document Revised: 01/01/2017 Document Reviewed: 02/07/2016 Elsevier Patient Education  2020 [employer].

## 2018-12-22 NOTE — Transfer of Care (Signed)
Immediate Anesthesia Transfer of Care Note  Patient: Philip [name]  Procedure(s) Performed: VIDEO BRONCHOSCOPY WITH ENDOBRONCHIAL ULTRASOUND (N/A ) RADIAL ENDOBRONCHIAL ULTRASOUND (N/A )  Patient Location: PACU  Anesthesia Type:General  Level of Consciousness: awake, alert , oriented and sedated  Airway & Oxygen Therapy: Patient Spontanous Breathing and Patient connected to face mask oxygen  Post-op Assessment: Report given to RN, Post -op Vital signs reviewed and stable and Patient moving all extremities    Post vital signs: Reviewed and stable  Last Vitals:  Vitals Value Taken Time  BP 112/67 12/22/18 1318  Temp    Pulse 90 12/22/18 1321  Resp 20 12/22/18 1321  SpO2 95 % 12/22/18 1321  Vitals shown include unvalidated device data.  Last Pain:  Vitals:   12/22/18 0744  TempSrc:   PainSc: 0-No pain         Complications: No apparent anesthesia complications

## 2018-12-22 NOTE — Anesthesia Procedure Notes (Signed)
Procedure Name: Intubation Date/Time: 12/22/2018 10:56 AM Performed by: Scheryl Darter, CRNA Pre-anesthesia Checklist: Patient identified, Emergency Drugs available, Suction available and Patient being monitored Patient Re-evaluated:Patient Re-evaluated prior to induction Oxygen Delivery Method: Circle System Utilized Preoxygenation: Pre-oxygenation with 100% oxygen Induction Type: IV induction Ventilation: Mask ventilation without difficulty Laryngoscope Size: Miller and 3 Grade View: Grade I Tube type: Oral Tube size: 8.5 mm Number of attempts: 1 Airway Equipment and Method: Stylet and Oral airway Placement Confirmation: ETT inserted through vocal cords under direct vision,  positive ETCO2 and breath sounds checked- equal and bilateral Secured at: 24 cm Tube secured with: Tape Dental Injury: Teeth and Oropharynx as per pre-operative assessment

## 2018-12-22 NOTE — Op Note (Signed)
Video Bronchoscopy with Endobronchial Ultrasound with peripheral targeted radial endobronchial ultrasound procedure Note  Date of Operation: 12/22/2018  Pre-op Diagnosis: Right perihilar mass, mediastinal adenopathy  Post-op Diagnosis: Right perihilar mass, mediastinal adenopathy  Surgeon: Garner Nash, DO   Assistants: None   Anesthesia: General endotracheal anesthesia  Operation: Flexible video fiberoptic bronchoscopy with endobronchial ultrasound and biopsies.  Estimated Blood Loss: Minimal, <3OZ  Complications: None   Indications and History: Philip [name] is a 63 y.o. male with right perihilar mass, mediastinal adenopathy.  The risks, benefits, complications, treatment options and expected outcomes were discussed with the patient.  The possibilities of pneumothorax, pneumonia, reaction to medication, pulmonary aspiration, perforation of a viscus, bleeding, failure to diagnose a condition and creating a complication requiring transfusion or operation were discussed with the patient who freely signed the consent.    Description of Procedure: The patient was examined in the preoperative area and history and data from the preprocedure consultation were reviewed. It was deemed appropriate to proceed.  The patient was taken to [place], identified as DEIONTAE RABEL and the procedure verified as Flexible Video Fiberoptic Bronchoscopy.  A Time Out was held and the above information confirmed. After being taken to the operating room general anesthesia was initiated and the patient  was orally intubated. The video fiberoptic bronchoscope was introduced via the endotracheal tube and a general inspection was performed which showed normal right and left lung anatomy no evidence of endobronchial lesion.  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 The standard scope was then withdrawn and the endobronchial ultrasound was used to identify and characterize the  peritracheal, hilar and bronchial lymph nodes. Inspection showed enlarged station 7 lymph node as well as large right perihilar mass and conglomerate formation of nodes, we looked at the pretracheal region and there was a small node visible however unable to sample. Using real-time ultrasound guidance Wang needle biopsies were take from Station 7 node followed by biopsies of the right perihilar mass And were sent for cytology.  Following completion of the curvilinear endobronchial ultrasound we switched back to a standard forward-viewing scope.  Under fluoroscopy we passed to the Olympus radial endobronchial ultrasound into the posterior segments of the right upper lobe.  We also under real-time radial ultrasound were able to locate a masslike structure adjacent to the proximal bronchial wall.  The sheath was left in place and the radial endobronchial ultrasound for peripheral targeting was removed and brushings, forcep biopsies were taken.  This was all done under fluoroscopic guidance.  At the conclusion we completed a BAL with 120 cc of saline to the right upper lobe and moderate return.  We also completed a BAL to the right lower lobe with 120 cc of saline and moderate return.  Both BAL to be sent for culture and cytology. The patient tolerated the procedure well without apparent complications. There was no significant blood loss. The bronchoscope was withdrawn. Anesthesia was reversed and the patient was taken to the PACU for recovery.   Samples: 1. Wang needle biopsies from station 7 node 2. Wang needle biopsies from right perihilar mass 3.  Transbronchial biopsies right upper lobe 4.  Right upper lobe brushings 5.  BAL right upper lobe 6.  BAL right lower lobe  Plans:  The patient will be discharged from the PACU to home when recovered from anesthesia. We will review the cytology, pathology and microbiology results with the patient when they become available. Outpatient  followup will be with  Garner Nash, DO.   Garner Nash, DO [facility] 12/22/2018 1:23 PM

## 2018-12-22 NOTE — Anesthesia Preprocedure Evaluation (Addendum)
Anesthesia Evaluation  Patient identified by MRN, date of birth, ID band Patient awake    Reviewed: Allergy & Precautions, NPO status , Patient's Chart, lab work & pertinent test results  Airway Mallampati: III  TM Distance: >3 FB Neck ROM: Full    Dental no notable dental hx.    Pulmonary Current Smoker and Patient abstained from smoking.,    Pulmonary exam normal breath sounds clear to auscultation       Cardiovascular negative cardio ROS Normal cardiovascular exam Rhythm:Regular Rate:Normal     Neuro/Psych negative neurological ROS  negative psych ROS   GI/Hepatic negative GI ROS, Neg liver ROS,   Endo/Other  negative endocrine ROS  Renal/GU negative Renal ROS     Musculoskeletal negative musculoskeletal ROS (+)   Abdominal   Peds  Hematology HLD   Anesthesia Other Findings Lung Mass  Reproductive/Obstetrics                            Anesthesia Physical Anesthesia Plan  ASA: II  Anesthesia Plan: General   Post-op Pain Management:    Induction: Intravenous  PONV Risk Score and Plan: 1 and Ondansetron, Dexamethasone, Midazolam and Treatment may vary due to age or medical condition  Airway Management Planned: Oral ETT  Additional Equipment:   Intra-op Plan:   Post-operative Plan: Extubation in OR  Informed Consent: I have reviewed the patients History and Physical, chart, labs and discussed the procedure including the risks, benefits and alternatives for the proposed anesthesia with the patient or authorized representative who has indicated his/her understanding and acceptance.     Dental advisory given  Plan Discussed with: CRNA  Anesthesia Plan Comments:        Anesthesia Quick Evaluation

## 2018-12-22 NOTE — Interval H&P Note (Signed)
History and Physical Interval Note:  12/22/2018 10:15 AM  Philip [name]  has presented today for surgery, with the diagnosis of Lung Mass.  The various methods of treatment have been discussed with the patient and family. After consideration of risks, benefits and other options for treatment, the patient has consented to  Procedure(s): VIDEO BRONCHOSCOPY WITH ENDOBRONCHIAL ULTRASOUND (N/A) POSSIBLE RADIAL ENDOBRONCHIAL ULTRASOUND (N/A) as a surgical intervention.  The patient's history has been reviewed, patient examined, no change in status, stable for surgery.  I have reviewed the patient's chart and labs.  Questions were answered to the patient's satisfaction.    Patient seen in pre-op. All questions answered. Risks, benefits and alternatives discussed. No barriers to proceed.   [place]

## 2018-12-22 NOTE — Telephone Encounter (Signed)
ebus [id number] no auth req and it is a covered benefit lm for [employer]

## 2018-12-22 NOTE — Telephone Encounter (Signed)
[facility] is calling and stated that they are waiting for authorization still. Patient is already admitted. CB is [id number] ext [phone]

## 2018-12-23 ENCOUNTER — Encounter (HOSPITAL_COMMUNITY): Payer: Self-pay | Admitting: Pulmonary Disease

## 2018-12-23 LAB — SURGICAL PATHOLOGY

## 2018-12-23 LAB — ACID FAST SMEAR (AFB, MYCOBACTERIA)
Acid Fast Smear: NEGATIVE
Acid Fast Smear: NEGATIVE

## 2018-12-24 LAB — CULTURE, RESPIRATORY W GRAM STAIN: Culture: NORMAL

## 2018-12-25 LAB — CULTURE, RESPIRATORY W GRAM STAIN: Culture: NORMAL

## 2018-12-26 LAB — CYTOLOGY - NON PAP

## 2018-12-26 LAB — SURGICAL PATHOLOGY

## 2018-12-28 ENCOUNTER — Telehealth: Payer: Self-pay | Admitting: Pulmonary Disease

## 2018-12-28 NOTE — Telephone Encounter (Signed)
Pt did return  call and sperate message was sent to Dr. Valeta Harms.

## 2018-12-28 NOTE — Telephone Encounter (Signed)
Pt is already scheduled to see BI on 12/9.  Nothing further needed at this time- will close encounter.

## 2018-12-28 NOTE — Telephone Encounter (Signed)
PCCM:  I attempted to contact patient regarding pathology results from recent bronchoscopy. I left a message on his VM to return our call to the office.   Garner Nash, DO [facility] 12/28/2018 8:12 AM

## 2018-12-28 NOTE — Telephone Encounter (Signed)
Dr. Valeta Harms please call pt back for results. Previous message states that you called pt this morning.

## 2018-12-28 NOTE — Telephone Encounter (Signed)
PCCM:  I called and spoke with the patient regarding pathology results.  All samples from recent bronchoscopy with no evidence of malignancy on pathology.  Cultures also no growth to date except with normal flora.  Patient has PET scan scheduled for December 1.  Should have follow-up to see me in clinic following the PET scan to discuss next steps.  Garner Nash, DO [facility] 12/28/2018 12:19 PM

## 2019-01-03 ENCOUNTER — Other Ambulatory Visit: Payer: Self-pay

## 2019-01-03 ENCOUNTER — Encounter (HOSPITAL_COMMUNITY)
Admission: RE | Admit: 2019-01-03 | Discharge: 2019-01-03 | Disposition: A | Discharge status: Home / Self Care | Payer: Managed Care, Other (non HMO) | Source: Ambulatory Visit | Attending: Pulmonary Disease | Admitting: Pulmonary Disease

## 2019-01-03 DIAGNOSIS — C3490 Malignant neoplasm of unspecified part of unspecified bronchus or lung: Secondary | ICD-10-CM | POA: Insufficient documentation

## 2019-01-03 DIAGNOSIS — R918 Other nonspecific abnormal finding of lung field: Secondary | ICD-10-CM

## 2019-01-03 LAB — GLUCOSE, CAPILLARY: Glucose-Capillary: 113 mg/dL — ABNORMAL HIGH (ref 70–99)

## 2019-01-03 MED ORDER — FLUDEOXYGLUCOSE F - 18 (FDG) INJECTION
8.9000 | Freq: Once | INTRAVENOUS | Status: AC | PRN
  Administered 2019-01-03: 8.9 via INTRAVENOUS

## 2019-01-04 ENCOUNTER — Telehealth: Payer: Self-pay | Admitting: Pulmonary Disease

## 2019-01-04 DIAGNOSIS — R918 Other nonspecific abnormal finding of lung field: Secondary | ICD-10-CM

## 2019-01-04 NOTE — Telephone Encounter (Signed)
Covid test has been scheduled for 12/04 at 10am at [address].   Call made to patient, confirmed DOB, made aware of covid test and location.   Made aware of Bronch date and location.   Voiced understanding.   Nothing further needed at this time.

## 2019-01-04 NOTE — Telephone Encounter (Signed)
PCCM:  I called and spoke with patient regarding PET CT results. We will plan for repeat bronchoscopy with myself and Dr. Tamala Julian next Monday.   Orders placed.   Please coordinate COVID testing.   [facility] 01/04/2019 1:35 PM

## 2019-01-05 ENCOUNTER — Other Ambulatory Visit: Payer: Self-pay

## 2019-01-05 ENCOUNTER — Encounter (HOSPITAL_COMMUNITY): Payer: Self-pay | Admitting: *Deleted

## 2019-01-05 NOTE — Progress Notes (Signed)
Spoke with pt for pre-op call. Pt denies cardiac history, HTN or Diabetes. Had same procedure done in November. States nothing has changed since he was seen here.   Pt will have his Covid test done on 01/06/19 and he voices understanding of being in quarantine until surgery.

## 2019-01-06 ENCOUNTER — Other Ambulatory Visit (HOSPITAL_COMMUNITY)
Admission: RE | Admit: 2019-01-06 | Discharge: 2019-01-06 | Disposition: A | Discharge status: Home / Self Care | Payer: Managed Care, Other (non HMO) | Source: Ambulatory Visit | Attending: Pulmonary Disease | Admitting: Pulmonary Disease

## 2019-01-06 DIAGNOSIS — Z01812 Encounter for preprocedural laboratory examination: Secondary | ICD-10-CM | POA: Insufficient documentation

## 2019-01-06 DIAGNOSIS — Z20828 Contact with and (suspected) exposure to other viral communicable diseases: Secondary | ICD-10-CM | POA: Insufficient documentation

## 2019-01-06 LAB — SARS CORONAVIRUS 2 (TAT 6-24 HRS): SARS Coronavirus 2: NEGATIVE

## 2019-01-09 ENCOUNTER — Ambulatory Visit (HOSPITAL_COMMUNITY): Payer: Managed Care, Other (non HMO)

## 2019-01-09 ENCOUNTER — Encounter (HOSPITAL_COMMUNITY): Payer: Self-pay

## 2019-01-09 ENCOUNTER — Ambulatory Visit (HOSPITAL_COMMUNITY): Payer: Managed Care, Other (non HMO) | Admitting: Anesthesiology

## 2019-01-09 ENCOUNTER — Encounter (HOSPITAL_COMMUNITY)
Admission: RE | Disposition: A | Discharge status: Home / Self Care | Payer: Self-pay | Source: Home / Self Care | Attending: Pulmonary Disease

## 2019-01-09 ENCOUNTER — Inpatient Hospital Stay (HOSPITAL_COMMUNITY)
Admission: RE | Admit: 2019-01-09 | Discharge: 2019-01-11 | DRG: 167 | Disposition: A | Discharge status: Home / Self Care | Payer: Managed Care, Other (non HMO) | Attending: Internal Medicine | Admitting: Internal Medicine

## 2019-01-09 ENCOUNTER — Other Ambulatory Visit: Payer: Self-pay

## 2019-01-09 DIAGNOSIS — J439 Emphysema, unspecified: Secondary | ICD-10-CM | POA: Diagnosis present

## 2019-01-09 DIAGNOSIS — Z800 Family history of malignant neoplasm of digestive organs: Secondary | ICD-10-CM

## 2019-01-09 DIAGNOSIS — C3491 Malignant neoplasm of unspecified part of right bronchus or lung: Secondary | ICD-10-CM | POA: Diagnosis present

## 2019-01-09 DIAGNOSIS — J431 Panlobular emphysema: Secondary | ICD-10-CM | POA: Diagnosis not present

## 2019-01-09 DIAGNOSIS — Z79899 Other long term (current) drug therapy: Secondary | ICD-10-CM | POA: Diagnosis not present

## 2019-01-09 DIAGNOSIS — F17210 Nicotine dependence, cigarettes, uncomplicated: Secondary | ICD-10-CM | POA: Diagnosis present

## 2019-01-09 DIAGNOSIS — E785 Hyperlipidemia, unspecified: Secondary | ICD-10-CM | POA: Diagnosis present

## 2019-01-09 DIAGNOSIS — J95811 Postprocedural pneumothorax: Secondary | ICD-10-CM | POA: Diagnosis present

## 2019-01-09 DIAGNOSIS — R0902 Hypoxemia: Secondary | ICD-10-CM | POA: Diagnosis present

## 2019-01-09 DIAGNOSIS — J9312 Secondary spontaneous pneumothorax: Secondary | ICD-10-CM | POA: Diagnosis not present

## 2019-01-09 DIAGNOSIS — Z9889 Other specified postprocedural states: Secondary | ICD-10-CM | POA: Diagnosis not present

## 2019-01-09 DIAGNOSIS — R918 Other nonspecific abnormal finding of lung field: Secondary | ICD-10-CM | POA: Diagnosis not present

## 2019-01-09 DIAGNOSIS — Z885 Allergy status to narcotic agent status: Secondary | ICD-10-CM

## 2019-01-09 DIAGNOSIS — Z7982 Long term (current) use of aspirin: Secondary | ICD-10-CM

## 2019-01-09 DIAGNOSIS — J9601 Acute respiratory failure with hypoxia: Secondary | ICD-10-CM | POA: Diagnosis not present

## 2019-01-09 DIAGNOSIS — Z20828 Contact with and (suspected) exposure to other viral communicable diseases: Secondary | ICD-10-CM | POA: Diagnosis present

## 2019-01-09 DIAGNOSIS — C3401 Malignant neoplasm of right main bronchus: Secondary | ICD-10-CM | POA: Diagnosis not present

## 2019-01-09 DIAGNOSIS — Z419 Encounter for procedure for purposes other than remedying health state, unspecified: Secondary | ICD-10-CM

## 2019-01-09 DIAGNOSIS — J939 Pneumothorax, unspecified: Secondary | ICD-10-CM | POA: Diagnosis present

## 2019-01-09 HISTORY — PX: VIDEO BRONCHOSCOPY WITH ENDOBRONCHIAL ULTRASOUND: SHX6177

## 2019-01-09 LAB — COMPREHENSIVE METABOLIC PANEL
ALT: 25 U/L (ref 0–44)
AST: 31 U/L (ref 15–41)
Albumin: 3.6 g/dL (ref 3.5–5.0)
Alkaline Phosphatase: 70 U/L (ref 38–126)
Anion gap: 9 (ref 5–15)
BUN: 18 mg/dL (ref 8–23)
CO2: 22 mmol/L (ref 22–32)
Calcium: 8.9 mg/dL (ref 8.9–10.3)
Chloride: 110 mmol/L (ref 98–111)
Creatinine, Ser: 0.94 mg/dL (ref 0.61–1.24)
GFR calc Af Amer: >60 mL/min (ref >60)
GFR calc non Af Amer: >60 mL/min (ref >60)
Glucose, Bld: 101 mg/dL — ABNORMAL HIGH (ref 70–99)
Potassium: 4.7 mmol/L (ref 3.5–5.1)
Sodium: 141 mmol/L (ref 135–145)
Total Bilirubin: 0.5 mg/dL (ref 0.3–1.2)
Total Protein: 7.1 g/dL (ref 6.5–8.1)

## 2019-01-09 LAB — CBC
HCT: 49.4 % (ref 39.0–52.0)
Hemoglobin: 16.3 g/dL (ref 13.0–17.0)
MCH: 31.7 pg (ref 26.0–34.0)
MCHC: 33 g/dL (ref 30.0–36.0)
MCV: 95.9 fL (ref 80.0–100.0)
Platelets: 264 10*3/uL (ref 150–400)
RBC: 5.15 MIL/uL (ref 4.22–5.81)
RDW: 12.2 % (ref 11.5–15.5)
WBC: 7.2 10*3/uL (ref 4.0–10.5)
nRBC: 0 % (ref 0.0–0.2)

## 2019-01-09 LAB — PROTIME-INR
INR: 1 (ref 0.8–1.2)
Prothrombin Time: 12.7 seconds (ref 11.4–15.2)

## 2019-01-09 LAB — APTT: aPTT: 34 seconds (ref 24–36)

## 2019-01-09 SURGERY — BRONCHOSCOPY, WITH EBUS
Anesthesia: General

## 2019-01-09 MED ORDER — ENOXAPARIN SODIUM 40 MG/0.4ML ~~LOC~~ SOLN
40.0000 mg | SUBCUTANEOUS | Status: DC
  Administered 2019-01-10 – 2019-01-11 (×2): 40 mg via SUBCUTANEOUS
  Filled 2019-01-09 (×3): qty 0.4

## 2019-01-09 MED ORDER — ONDANSETRON HCL 4 MG/2ML IJ SOLN: 4.0000 mg | Freq: Four times a day (QID) | INTRAMUSCULAR | Status: DC | PRN

## 2019-01-09 MED ORDER — DEXAMETHASONE SODIUM PHOSPHATE 10 MG/ML IJ SOLN
INTRAMUSCULAR | Status: AC
  Filled 2019-01-09: qty 1

## 2019-01-09 MED ORDER — ROCURONIUM BROMIDE 10 MG/ML (PF) SYRINGE
PREFILLED_SYRINGE | INTRAVENOUS | Status: AC
  Filled 2019-01-09: qty 10

## 2019-01-09 MED ORDER — LIDOCAINE 2% (20 MG/ML) 5 ML SYRINGE
INTRAMUSCULAR | Status: DC | PRN
  Administered 2019-01-09: 80 mg via INTRAVENOUS

## 2019-01-09 MED ORDER — PHENYLEPHRINE 40 MCG/ML (10ML) SYRINGE FOR IV PUSH (FOR BLOOD PRESSURE SUPPORT)
PREFILLED_SYRINGE | INTRAVENOUS | Status: AC
  Filled 2019-01-09: qty 10

## 2019-01-09 MED ORDER — 0.9 % SODIUM CHLORIDE (POUR BTL) OPTIME
TOPICAL | Status: DC | PRN
  Administered 2019-01-09: 1000 mL

## 2019-01-09 MED ORDER — PHENYLEPHRINE 40 MCG/ML (10ML) SYRINGE FOR IV PUSH (FOR BLOOD PRESSURE SUPPORT)
PREFILLED_SYRINGE | INTRAVENOUS | Status: DC | PRN
  Administered 2019-01-09: 120 ug via INTRAVENOUS

## 2019-01-09 MED ORDER — ACETAMINOPHEN 325 MG PO TABS
650.0000 mg | ORAL_TABLET | Freq: Four times a day (QID) | ORAL | Status: DC | PRN
  Administered 2019-01-09: 650 mg via ORAL
  Filled 2019-01-09: qty 2

## 2019-01-09 MED ORDER — ROCURONIUM BROMIDE 10 MG/ML (PF) SYRINGE
PREFILLED_SYRINGE | INTRAVENOUS | Status: DC | PRN
  Administered 2019-01-09: 50 mg via INTRAVENOUS
  Administered 2019-01-09: 10 mg via INTRAVENOUS
  Administered 2019-01-09: 20 mg via INTRAVENOUS

## 2019-01-09 MED ORDER — ONDANSETRON HCL 4 MG/2ML IJ SOLN
INTRAMUSCULAR | Status: DC | PRN
  Administered 2019-01-09: 4 mg via INTRAVENOUS

## 2019-01-09 MED ORDER — DEXAMETHASONE SODIUM PHOSPHATE 10 MG/ML IJ SOLN
INTRAMUSCULAR | Status: DC | PRN
  Administered 2019-01-09: 5 mg via INTRAVENOUS

## 2019-01-09 MED ORDER — PROPOFOL 10 MG/ML IV BOLUS
INTRAVENOUS | Status: AC
  Filled 2019-01-09: qty 20

## 2019-01-09 MED ORDER — ONDANSETRON HCL 4 MG PO TABS
4.0000 mg | ORAL_TABLET | Freq: Four times a day (QID) | ORAL | Status: DC | PRN
  Filled 2019-01-09: qty 1

## 2019-01-09 MED ORDER — MIDAZOLAM HCL 2 MG/2ML IJ SOLN
INTRAMUSCULAR | Status: AC
  Filled 2019-01-09: qty 2

## 2019-01-09 MED ORDER — LIDOCAINE 2% (20 MG/ML) 5 ML SYRINGE
INTRAMUSCULAR | Status: AC
  Filled 2019-01-09: qty 5

## 2019-01-09 MED ORDER — FENTANYL CITRATE (PF) 250 MCG/5ML IJ SOLN
INTRAMUSCULAR | Status: DC | PRN
  Administered 2019-01-09: 50 ug via INTRAVENOUS

## 2019-01-09 MED ORDER — ALBUTEROL SULFATE (2.5 MG/3ML) 0.083% IN NEBU
2.5000 mg | INHALATION_SOLUTION | RESPIRATORY_TRACT | Status: DC | PRN
  Administered 2019-01-09: 2.5 mg via RESPIRATORY_TRACT
  Filled 2019-01-09 (×2): qty 3

## 2019-01-09 MED ORDER — FENTANYL CITRATE (PF) 100 MCG/2ML IJ SOLN: 25.0000 ug | INTRAMUSCULAR | Status: DC | PRN

## 2019-01-09 MED ORDER — PHENYLEPHRINE HCL (PRESSORS) 10 MG/ML IV SOLN
INTRAVENOUS | Status: DC | PRN
  Administered 2019-01-09: 120 ug via INTRAVENOUS

## 2019-01-09 MED ORDER — ACETAMINOPHEN 500 MG PO TABS
1000.0000 mg | ORAL_TABLET | Freq: Once | ORAL | Status: AC
  Administered 2019-01-09: 1000 mg via ORAL
  Filled 2019-01-09: qty 2

## 2019-01-09 MED ORDER — LACTATED RINGERS IV SOLN
INTRAVENOUS | Status: DC | PRN
  Administered 2019-01-09: 09:00:00 via INTRAVENOUS

## 2019-01-09 MED ORDER — ONDANSETRON HCL 4 MG/2ML IJ SOLN
INTRAMUSCULAR | Status: AC
  Filled 2019-01-09: qty 2

## 2019-01-09 MED ORDER — ONDANSETRON HCL 4 MG/2ML IJ SOLN: 4.0000 mg | Freq: Once | INTRAMUSCULAR | Status: DC | PRN

## 2019-01-09 MED ORDER — PROPOFOL 10 MG/ML IV BOLUS
INTRAVENOUS | Status: DC | PRN
  Administered 2019-01-09: 150 mg via INTRAVENOUS

## 2019-01-09 MED ORDER — FENTANYL CITRATE (PF) 250 MCG/5ML IJ SOLN
INTRAMUSCULAR | Status: AC
  Filled 2019-01-09: qty 5

## 2019-01-09 MED ORDER — SUGAMMADEX SODIUM 200 MG/2ML IV SOLN
INTRAVENOUS | Status: DC | PRN
  Administered 2019-01-09: 200 mg via INTRAVENOUS

## 2019-01-09 MED ORDER — MIDAZOLAM HCL 5 MG/5ML IJ SOLN
INTRAMUSCULAR | Status: DC | PRN
  Administered 2019-01-09: 2 mg via INTRAVENOUS

## 2019-01-09 MED ORDER — ACETAMINOPHEN 650 MG RE SUPP: 650.0000 mg | Freq: Four times a day (QID) | RECTAL | Status: DC | PRN

## 2019-01-09 SURGICAL SUPPLY — 39 items
ADAPTER VALVE BIOPSY EBUS (MISCELLANEOUS) IMPLANT
ADPTR VALVE BIOPSY EBUS (MISCELLANEOUS)
BALLN FOR EBUS SCOPE (BALLOONS) ×4
BALLOON FOR EBUS SCOPE (BALLOONS) ×2 IMPLANT
BRUSH CYTOL CELLEBRITY 1.5X140 (MISCELLANEOUS) ×8 IMPLANT
CANISTER SUCT 3000ML PPV (MISCELLANEOUS) ×4 IMPLANT
CONT SPEC 4OZ CLIKSEAL STRL BL (MISCELLANEOUS) ×8 IMPLANT
COVER BACK TABLE 60X90IN (DRAPES) ×4 IMPLANT
COVER WAND RF STERILE (DRAPES) IMPLANT
FILTER STRAW FLUID ASPIR (MISCELLANEOUS) IMPLANT
FORCEPS BIOP RJ4 1.8 (CUTTING FORCEPS) IMPLANT
GAUZE SPONGE 4X4 12PLY STRL (GAUZE/BANDAGES/DRESSINGS) ×4 IMPLANT
GLOVE SURG SS PI 7.5 STRL IVOR (GLOVE) ×8 IMPLANT
GOWN STRL REUS W/ TWL LRG LVL3 (GOWN DISPOSABLE) ×6 IMPLANT
GOWN STRL REUS W/TWL LRG LVL3 (GOWN DISPOSABLE) ×6
KIT CLEAN ENDO COMPLIANCE (KITS) ×8 IMPLANT
KIT TURNOVER KIT B (KITS) ×4 IMPLANT
MARKER SKIN DUAL TIP RULER LAB (MISCELLANEOUS) ×4 IMPLANT
NEEDLE ASPIRATION VIZISHOT 19G (NEEDLE) IMPLANT
NEEDLE ASPIRATION VIZISHOT 21G (NEEDLE) ×4 IMPLANT
NS IRRIG 1000ML POUR BTL (IV SOLUTION) ×4 IMPLANT
OIL SILICONE PENTAX (PARTS (SERVICE/REPAIRS)) ×4 IMPLANT
PAD ARMBOARD 7.5X6 YLW CONV (MISCELLANEOUS) ×8 IMPLANT
STOPCOCK 4 WAY LG BORE MALE ST (IV SETS) ×4 IMPLANT
SYR 20ML ECCENTRIC (SYRINGE) ×4 IMPLANT
SYR 20ML LL LF (SYRINGE) ×4 IMPLANT
SYR 3ML LL SCALE MARK (SYRINGE) IMPLANT
SYR 50ML SLIP (SYRINGE) IMPLANT
SYR 5ML LL (SYRINGE) ×4 IMPLANT
TOWEL GREEN STERILE FF (TOWEL DISPOSABLE) ×4 IMPLANT
TRAP SPECIMEN MUCOUS 40CC (MISCELLANEOUS) IMPLANT
TUBE CONNECTING 20'X1/4 (TUBING) ×1
TUBE CONNECTING 20X1/4 (TUBING) ×3 IMPLANT
TUBING EXTENTION W/L.L. (IV SETS) ×4 IMPLANT
UNDERPAD 30X30 (UNDERPADS AND DIAPERS) ×4 IMPLANT
VALVE BIOPSY  SINGLE USE (MISCELLANEOUS) ×2
VALVE BIOPSY SINGLE USE (MISCELLANEOUS) ×2 IMPLANT
VALVE SUCTION BRONCHIO DISP (MISCELLANEOUS) ×8 IMPLANT
WATER STERILE IRR 1000ML POUR (IV SOLUTION) ×4 IMPLANT

## 2019-01-09 NOTE — H&P (Signed)
NAME:  Philip [name], MRN:  [id number], DOB:  08-09-1955, LOS: 0 ADMISSION DATE:  01/09/2019, CONSULTATION DATE: 01/09/2019 REFERRING MD: Not applicable, CHIEF COMPLAINT: Right-sided postprocedural pneumothorax  History of present illness  63 year old gentleman past medical history of tobacco abuse initial abnormal lung cancer screening CT on November 2020.  Patient underwent original bronchoscopy on 12/22/2018 which was nondiagnostic.  Patient had PET scan with abnormal PET avid lymphadenopathy mediastinal adenopathy and mass within the right hilum.  Patient was taken for repeat bronchoscopy on 01/09/2019.  This included multiple transbronchial biopsies to the right upper lobe and right lower lobe superior segment.  Ultimately having a small postprocedural pneumothorax.  Decision was made for admission to the hospital for observation on oxygen therapy with repeat chest x-ray in the morning.  Past Medical History   Past Medical History:  Diagnosis Date  . Hyperlipidemia   . Pneumonia   . Tobacco abuse      Significant Hospital Events     Consults:    Procedures:   01-09-2019: Video bronchoscopy with endobronchial ultrasound, nodal sampling as well as transbronchial biopsies to the right upper lobe and right lower lobe superior segment.  Significant Diagnostic Tests:    Micro Data:    Antimicrobials:     Interim history/subjective:  Please see HPI above  Objective   Blood pressure 112/79, pulse 60, temperature (!) 97 F (36.1 C), resp. rate 13, height 5\' 6"  (1.676 m), weight 81.6 kg, SpO2 100 %.        Intake/Output Summary (Last 24 hours) at 01/09/2019 1701 Last data filed at 01/09/2019 1135 Gross per 24 hour  Intake 650 ml  Output 10 ml  Net 640 ml   Filed Weights   01/09/19 0722  Weight: 81.6 kg    Examination: General: Male, resting in bed, no acute distress HENT: NCAT, sclera clear, tracking appropriately Lungs: Bilateral breath sounds, no crackles no  wheeze Cardiovascular: Regular rate rhythm, S1-S2 Abdomen: Soft, nontender, nondistended Extremities: No edema, no swelling, warm Neuro: Alert, oriented following commands GU: Deferred  Resolved Hospital Problem list     Assessment & Plan:   Right-sided postprocedural, secondary pneumothorax Right-sided hilar lung mass, mediastinal hilar adenopathy Status post video bronchoscopy with endobronchial ultrasound-guided transbronchial needle aspiration biopsies to the mediastinal adenopathy as well as hilar mass Status post transbronchial biopsies, forceps to the right upper lobe and right lower lobe Abnormal PET scan of the chest concerning for malignancy Plan: Admit to the hospital for observation Leave on O2 therapy to maintain oxygen saturation at 100% if possible Repeat chest x-ray in the a.m. If patient develops any type of chest discomfort or shortness of breath please order stat chest x-ray to look for any clinical worsening of pneumothorax If needed may need pigtail catheter placement.   Best practice:  Diet: Regular Pain/Anxiety/Delirium protocol (if indicated): Not applicable VAP protocol (if indicated): Not applicable DVT prophylaxis: Lovenox GI prophylaxis: Not applicable Glucose control: Not applicable  Mobility: up in chair  Code Status: FULL Family Communication: discussed with wife Disposition:   Labs   CBC: Recent Labs  Lab 01/09/19 0716  WBC 7.2  HGB 16.3  HCT 49.4  MCV 95.9  PLT 226    Basic Metabolic Panel: Recent Labs  Lab 01/09/19 0716  NA 141  K 4.7  CL 110  CO2 22  GLUCOSE 101*  BUN 18  CREATININE 0.94  CALCIUM 8.9   GFR: Estimated Creatinine Clearance: 80.7 mL/min (by C-G formula based on SCr  of 0.94 mg/dL). Recent Labs  Lab 01/09/19 0716  WBC 7.2    Liver Function Tests: Recent Labs  Lab 01/09/19 0716  AST 31  ALT 25  ALKPHOS 70  BILITOT 0.5  PROT 7.1  ALBUMIN 3.6   No results for input(s): LIPASE, AMYLASE in the  last 168 hours. No results for input(s): AMMONIA in the last 168 hours.  ABG No results found for: PHART, PCO2ART, PO2ART, HCO3, TCO2, ACIDBASEDEF, O2SAT   Coagulation Profile: Recent Labs  Lab 01/09/19 0716  INR 1.0    Cardiac Enzymes: No results for input(s): CKTOTAL, CKMB, CKMBINDEX, TROPONINI in the last 168 hours.  HbA1C: No results found for: HGBA1C  CBG: Recent Labs  Lab 01/03/19 0702  GLUCAP 113*    Review of Systems:   Review of Systems  Constitutional: Negative for chills, fever, malaise/fatigue and weight loss.  HENT: Negative for hearing loss, sore throat and tinnitus.   Eyes: Negative for blurred vision and double vision.  Respiratory: Positive for cough. Negative for hemoptysis, sputum production, shortness of breath, wheezing and stridor.   Cardiovascular: Negative for chest pain, palpitations, orthopnea, leg swelling and PND.  Gastrointestinal: Negative for abdominal pain, constipation, diarrhea, heartburn, nausea and vomiting.  Genitourinary: Negative for dysuria, hematuria and urgency.  Musculoskeletal: Negative for joint pain and myalgias.  Skin: Negative for itching and rash.  Neurological: Negative for dizziness, tingling, weakness and headaches.  Endo/Heme/Allergies: Negative for environmental allergies. Does not bruise/bleed easily.  Psychiatric/Behavioral: Negative for depression. The patient is not nervous/anxious and does not have insomnia.   All other systems reviewed and are negative.    Past Medical History  He,  has a past medical history of Hyperlipidemia, Pneumonia, and Tobacco abuse.   Surgical History    Past Surgical History:  Procedure Laterality Date  . COLONOSCOPY    . HAND SURGERY    . VIDEO BRONCHOSCOPY WITH ENDOBRONCHIAL ULTRASOUND N/A 12/22/2018   Procedure: VIDEO BRONCHOSCOPY WITH ENDOBRONCHIAL ULTRASOUND;  Surgeon: Garner Nash, DO;  Location: [place];  Service: Thoracic;  Laterality: N/A;  . VIDEO BRONCHOSCOPY WITH  RADIAL ENDOBRONCHIAL ULTRASOUND N/A 12/22/2018   Procedure: RADIAL ENDOBRONCHIAL ULTRASOUND;  Surgeon: Garner Nash, DO;  Location: [place];  Service: Thoracic;  Laterality: N/A;     Social History   reports that he has been smoking cigarettes. He started smoking about 48 years ago. He has a 45.00 pack-year smoking history. He has quit using smokeless tobacco.  His smokeless tobacco use included chew. He reports current alcohol use. He reports previous drug use. Drug: Marijuana.   Family History   His family history includes Cancer in his father; Stomach cancer in his father.   Allergies Allergies  Allergen Reactions  . Norco [Hydrocodone-Acetaminophen] Other (See Comments)    Made pt feel jittery      Home Medications  Prior to Admission medications   Medication Sig Start Date End Date Taking? Authorizing Provider  aspirin 325 MG tablet Take 650 mg by mouth daily as needed for moderate pain.   Yes [provider]  Aspirin-Acetaminophen-Caffeine (GOODY HEADACHE PO) Take 1 packet by mouth daily as needed (headaches).   Yes [provider]  atorvastatin (LIPITOR) 20 MG tablet Take 20 mg by mouth daily. 09/22/18  Yes [provider]  bismuth subsalicylate (PEPTO BISMOL) 262 MG/15ML suspension Take 30 mLs by mouth every 6 (six) hours as needed for indigestion.   Yes [provider]  Tetrahydrozoline HCl (VISINE OP) Place 1 drop into  both eyes daily as needed (redness).   Yes [provider]  sildenafil (REVATIO) 20 MG tablet Take 20-60 mg by mouth daily as needed (ED).  09/22/18   [provider]    Garner Nash, [facility] 01/09/2019 5:01 PM

## 2019-01-09 NOTE — Op Note (Addendum)
Video Bronchoscopy with Endobronchial Ultrasound Procedure Note  Date of Operation: 01/09/2019  Pre-op Diagnosis: Right lung mass, mediastinal hilar adenopathy  Post-op Diagnosis: Right lung mass, mediastinal hilar adenopathy  Surgeon: Garner Nash, DO   Assistants: Truett Mainland Tamala Julian, MD   Anesthesia: General endotracheal anesthesia  Operation: Flexible video fiberoptic bronchoscopy with endobronchial ultrasound and biopsies.  Estimated Blood Loss: Minimal, <3NT  Complications: None   Indications and History: Philip [name] is a 63 y.o. male with right lung mass, mediastinal hilar adenopathy, abnormal PET scan concerning for primary bronchogenic carcinoma.  The risks, benefits, complications, treatment options and expected outcomes were discussed with the patient.  The possibilities of pneumothorax, pneumonia, reaction to medication, pulmonary aspiration, perforation of a viscus, bleeding, failure to diagnose a condition and creating a complication requiring transfusion or operation were discussed with the patient who freely signed the consent.    Description of Procedure: The patient was examined in the preoperative area and history and data from the preprocedure consultation were reviewed. It was deemed appropriate to proceed.  The patient was taken to [facility], identified as Philip [name] and the procedure verified as Flexible Video Fiberoptic Bronchoscopy.  A Time Out was held and the above information confirmed. After being taken to the operating room general anesthesia was initiated and the patient  was orally intubated. The video fiberoptic bronchoscope was introduced via the endotracheal tube and a general inspection was performed which showed normal right and left lung anatomy, no evidence of endobronchial lesion. The standard scope was then withdrawn and the endobronchial ultrasound was used to identify and characterize the peritracheal, hilar and bronchial lymph nodes. Inspection  showed enlarged 11L posterior node, small 4R, 11 R/hilar lesion.. Using real-time ultrasound guidance Wang needle biopsies were take from Station 11 L, 11 R/hilar lesion and were sent for cytology.  Following endobronchial ultrasound we switched to a regular forward-viewing scope.  We obtained brush specimens from right upper lobe and right lower lobe under fluoroscopic guidance as well as transbronchial biopsies to the right upper lobe and right lower lobe both directed at the right upper lobe posterior segment and the right lower lobe superior segment. The patient tolerated the procedure well without apparent complications. There was no significant blood loss. The bronchoscope was withdrawn. Anesthesia was reversed and the patient was taken to the PACU for recovery.   Samples: 1. Wang needle biopsies from station 11 L node 2. Wang needle biopsies from station 11 R node/right hilar mass 3.  Right upper lobe brushings 4.  Right lower lobe brushings 5.  Right upper lobe transbronchial forcep biopsies x10 6.  Right lower lobe transbronchial forceps biopsies x10 7.  Right upper lobe BAL 8.  Right lower lobe BAL  Plans:  The patient will be discharged from the PACU to home when recovered from anesthesia. We will review the cytology, pathology and microbiology results with the patient when they become available. Outpatient followup will be with Garner Nash, DO.   Garner Nash, DO [facility] 01/09/2019 11:32 AM

## 2019-01-09 NOTE — Progress Notes (Signed)
eLink Physician-Brief Progress Note Patient Name: TALYN DESSERT DOB: 08-30-1955 MRN: [id number]   Date of Service  01/09/2019  HPI/Events of Note  Wheezing. Sat = 97% and RR = 19.  eICU Interventions  Will order: 1. Albuterol 2.5 mg via neb Q 3 hours PRN wheezing or SOB.     Intervention Category Major Interventions: Other:  Lysle Dingwall 01/09/2019, 10:34 PM

## 2019-01-09 NOTE — Transfer of Care (Signed)
Immediate Anesthesia Transfer of Care Note  Patient: Philip [name]  Procedure(s) Performed: VIDEO BRONCHOSCOPY WITH ENDOBRONCHIAL ULTRASOUND WITH FLUORO (N/A )  Patient Location: PACU  Anesthesia Type:General  Level of Consciousness: awake, alert  and oriented  Airway & Oxygen Therapy: Patient Spontanous Breathing and Patient connected to face mask oxygen  Post-op Assessment: Report given to RN and Post -op Vital signs reviewed and stable  Post vital signs: Reviewed and stable  Last Vitals:  Vitals Value Taken Time  BP 110/69 01/09/19 1133  Temp 36.1 C 01/09/19 1133  Pulse 71 01/09/19 1135  Resp 16 01/09/19 1135  SpO2 99 % 01/09/19 1135  Vitals shown include unvalidated device data.  Last Pain:  Vitals:   01/09/19 1133  TempSrc:   PainSc: 0-No pain      Patients Stated Pain Goal: 2 (24/46/28 6381)  Complications: No apparent anesthesia complications

## 2019-01-09 NOTE — Anesthesia Procedure Notes (Signed)
Procedure Name: Intubation Date/Time: 01/09/2019 9:37 AM Performed by: Trinna Post., CRNA Pre-anesthesia Checklist: Patient identified, Emergency Drugs available, Suction available, Patient being monitored and Timeout performed Patient Re-evaluated:Patient Re-evaluated prior to induction Oxygen Delivery Method: Circle system utilized Preoxygenation: Pre-oxygenation with 100% oxygen Induction Type: IV induction Ventilation: Mask ventilation without difficulty Laryngoscope Size: Mac and 4 Grade View: Grade I Tube type: Oral Tube size: 8.5 mm Number of attempts: 1 Airway Equipment and Method: Stylet Placement Confirmation: ETT inserted through vocal cords under direct vision,  positive ETCO2 and breath sounds checked- equal and bilateral Secured at: 23 cm Tube secured with: Tape Dental Injury: Teeth and Oropharynx as per pre-operative assessment

## 2019-01-09 NOTE — Progress Notes (Addendum)
Pt wheezing in bilateral upper lobes on reassessment,SpO2 97% on 6 L simple mask. [place],MD aware,new order for bronchodilators placed. Pt denied worsening of SOB,no distress at this time. VS stable.  Will continue to monitor and treat per MD orders.

## 2019-01-09 NOTE — Interval H&P Note (Signed)
History and Physical Interval Note:  01/09/2019 7:17 AM  Philip [name]  has presented today for surgery, with the diagnosis of LEFT LUNG MASS.  The various methods of treatment have been discussed with the patient and family. After consideration of risks, benefits and other options for treatment, the patient has consented to  Procedure(s): VIDEO BRONCHOSCOPY WITH ENDOBRONCHIAL ULTRASOUND (Left) POSSIBLE VIDEO BRONCHOSCOPY WITH RADIAL ENDOBRONCHIAL ULTRASOUND (Left) as a surgical intervention.  The patient's history has been reviewed, patient examined, no change in status, stable for surgery.  I have reviewed the patient's chart and labs.  Questions were answered to the patient's satisfaction.    Patient seen in pre-op. All questions answered. No barriers to proceed. Discussed risks, benefits and alternatives.   [place]

## 2019-01-09 NOTE — Anesthesia Postprocedure Evaluation (Signed)
Anesthesia Post Note  Patient: Philip [name]  Procedure(s) Performed: VIDEO BRONCHOSCOPY WITH ENDOBRONCHIAL ULTRASOUND WITH FLUORO (N/A )     Patient location during evaluation: PACU Anesthesia Type: General Level of consciousness: awake and alert Pain management: pain level controlled Vital Signs Assessment: post-procedure vital signs reviewed and stable Respiratory status: spontaneous breathing, nonlabored ventilation, respiratory function stable and patient connected to nasal cannula oxygen Cardiovascular status: blood pressure returned to baseline and stable Postop Assessment: no apparent nausea or vomiting Anesthetic complications: no    Last Vitals:  Vitals:   01/09/19 1729 01/09/19 1824  BP: 106/83 112/83  Pulse: 68 76  Resp: 19 19  Temp:  36.8 C  SpO2: 100% 97%    Last Pain:  Vitals:   01/09/19 2026  TempSrc:   PainSc: 4                  Catalina Gravel

## 2019-01-09 NOTE — Anesthesia Preprocedure Evaluation (Addendum)
Anesthesia Evaluation  Patient identified by MRN, date of birth, ID band Patient awake    Reviewed: Allergy & Precautions, NPO status , Patient's Chart, lab work & pertinent test results  Airway Mallampati: II  TM Distance: >3 FB Neck ROM: Full    Dental  (+) Teeth Intact, Dental Advisory Given   Pulmonary Current Smoker and Patient abstained from smoking.,  Left lung mass   Pulmonary exam normal breath sounds clear to auscultation       Cardiovascular Exercise Tolerance: Good negative cardio ROS Normal cardiovascular exam Rhythm:Regular Rate:Normal     Neuro/Psych negative neurological ROS  negative psych ROS   GI/Hepatic negative GI ROS, Neg liver ROS,   Endo/Other  negative endocrine ROS  Renal/GU negative Renal ROS     Musculoskeletal negative musculoskeletal ROS (+)   Abdominal   Peds  Hematology negative hematology ROS (+)   Anesthesia Other Findings Day of surgery medications reviewed with the patient.  Reproductive/Obstetrics                            Anesthesia Physical Anesthesia Plan  ASA: III  Anesthesia Plan: General   Post-op Pain Management:    Induction: Intravenous  PONV Risk Score and Plan: 1 and Dexamethasone and Ondansetron  Airway Management Planned: Oral ETT  Additional Equipment:   Intra-op Plan:   Post-operative Plan: Extubation in OR  Informed Consent: I have reviewed the patients History and Physical, chart, labs and discussed the procedure including the risks, benefits and alternatives for the proposed anesthesia with the patient or authorized representative who has indicated his/her understanding and acceptance.     Dental advisory given  Plan Discussed with: CRNA  Anesthesia Plan Comments:         Anesthesia Quick Evaluation

## 2019-01-10 ENCOUNTER — Observation Stay (HOSPITAL_COMMUNITY): Payer: Managed Care, Other (non HMO)

## 2019-01-10 ENCOUNTER — Encounter (HOSPITAL_COMMUNITY): Payer: Self-pay | Admitting: Pulmonary Disease

## 2019-01-10 ENCOUNTER — Other Ambulatory Visit: Payer: Self-pay

## 2019-01-10 DIAGNOSIS — C3401 Malignant neoplasm of right main bronchus: Secondary | ICD-10-CM

## 2019-01-10 DIAGNOSIS — J431 Panlobular emphysema: Secondary | ICD-10-CM

## 2019-01-10 DIAGNOSIS — Z9889 Other specified postprocedural states: Secondary | ICD-10-CM | POA: Diagnosis not present

## 2019-01-10 DIAGNOSIS — J9312 Secondary spontaneous pneumothorax: Secondary | ICD-10-CM

## 2019-01-10 LAB — ACID FAST SMEAR (AFB, MYCOBACTERIA)
Acid Fast Smear: NEGATIVE
Acid Fast Smear: NEGATIVE

## 2019-01-10 LAB — HIV ANTIBODY (ROUTINE TESTING W REFLEX): HIV Screen 4th Generation wRfx: NONREACTIVE

## 2019-01-10 MED ORDER — ORAL CARE MOUTH RINSE
15.0000 mL | Freq: Two times a day (BID) | OROMUCOSAL | Status: DC
  Administered 2019-01-10 – 2019-01-11 (×3): 15 mL via OROMUCOSAL

## 2019-01-10 MED ORDER — GUAIFENESIN-DM 100-10 MG/5ML PO SYRP
5.0000 mL | ORAL_SOLUTION | ORAL | Status: DC | PRN
  Administered 2019-01-10: 5 mL via ORAL
  Filled 2019-01-10: qty 5

## 2019-01-10 MED ORDER — BENZONATATE 100 MG PO CAPS
100.0000 mg | ORAL_CAPSULE | Freq: Two times a day (BID) | ORAL | Status: DC
  Administered 2019-01-10 – 2019-01-11 (×3): 100 mg via ORAL
  Filled 2019-01-10 (×3): qty 1

## 2019-01-10 NOTE — Progress Notes (Signed)
NAME:  Philip [name], MRN:  [id number], DOB:  10-22-1955, LOS: 0 ADMISSION DATE:  01/09/2019, CONSULTATION DATE: 01/09/2019 REFERRING MD: Not applicable, CHIEF COMPLAINT: Right-sided postprocedural pneumothorax  History of present illness  63 year old gentleman past medical history of tobacco abuse initial abnormal lung cancer screening CT on November 2020.  Patient underwent original bronchoscopy on 12/22/2018 which was nondiagnostic.  Patient had PET scan with abnormal PET avid lymphadenopathy mediastinal adenopathy and mass within the right hilum.  Patient was taken for repeat bronchoscopy on 01/09/2019.  This included multiple transbronchial biopsies to the right upper lobe and right lower lobe superior segment.  Ultimately having a small postprocedural pneumothorax.  Decision was made for admission to the hospital for observation on oxygen therapy with repeat chest x-ray in the morning.  Past Medical History   Past Medical History:  Diagnosis Date  . Hyperlipidemia   . Pneumonia   . Tobacco abuse      Significant Hospital Events     Consults:    Procedures:   01-09-2019: Video bronchoscopy with endobronchial ultrasound, nodal sampling as well as transbronchial biopsies to the right upper lobe and right lower lobe superior segment.  Significant Diagnostic Tests:    Micro Data:    Antimicrobials:     Interim history/subjective:  Noted to be hypoxic despite improved pneumothorax  Objective   Blood pressure 99/60, pulse (!) 51, temperature 98.2 F (36.8 C), resp. rate 19, height 5\' 6"  (1.676 m), weight 81.6 kg, SpO2 100 %.        Intake/Output Summary (Last 24 hours) at 01/10/2019 1131 Last data filed at 01/09/2019 1135 Gross per 24 hour  Intake 650 ml  Output -  Net 650 ml   Filed Weights   01/09/19 0722  Weight: 81.6 kg    Examination: General: Well-developed well-developed male who is having coughing paroxysms HEENT: no JVD or lymphadenopathy is  appreciated Neuro: Grossly intact CV: Heart sounds are regular PULM: Coarse rhonchi bilaterally.  Currently with oxygen off during coughing paroxysms O2 saturations noted to be 84%.  GI: soft, bsx4 active  GU: Voids Extremities: Noted to have clubbing of fingers.  Extremities warm Skin: no rashes or lesions   Resolved Hospital Problem list     Assessment & Plan:   Right-sided postprocedural, secondary pneumothorax, pneumothorax improved on 01/10/2019 chest x-ray. Right-sided hilar lung mass, mediastinal hilar adenopathy Status post video bronchoscopy with endobronchial ultrasound-guided transbronchial needle aspiration biopsies to the mediastinal adenopathy as well as hilar mass Status post transbronchial biopsies, forceps to the right upper lobe and right lower lobe Abnormal PET scan of the chest concerning for malignancy Severe emphysema Hypoxia Coughing paroxysms Plan: Currently in the hospital on high flow oxygen with decreased size of right apical pneumothorax.  When taken off oxygen O2 sats go down to 88 to 87%.  He is noted to have coughing paroxysms at which time his O2 saturations decreased to the low 80s. Suspect he needs to remain in the hospital for 24 hours and continue to monitor sats as he may need home oxygen. Follow-up chest x-ray 01/11/2019 If pneumothorax is resolved we will discharge home plus or minus O2 as needed if pneumothorax remains he will need known pigtail chest tube Will order cough suppression      Best practice:  Diet: Regular Pain/Anxiety/Delirium protocol (if indicated): Not applicable VAP protocol (if indicated): Not applicable DVT prophylaxis: Lovenox GI prophylaxis: Not applicable Glucose control: Not applicable  Mobility: Sitting up in bed Code Status: FULL  Family Communication: 01/10/2019 patient updated at bedside Disposition:   Labs   CBC: Recent Labs  Lab 01/09/19 0716  WBC 7.2  HGB 16.3  HCT 49.4  MCV 95.9  PLT 264     Basic Metabolic Panel: Recent Labs  Lab 01/09/19 0716  NA 141  K 4.7  CL 110  CO2 22  GLUCOSE 101*  BUN 18  CREATININE 0.94  CALCIUM 8.9   GFR: Estimated Creatinine Clearance: 80.7 mL/min (by C-G formula based on SCr of 0.94 mg/dL). Recent Labs  Lab 01/09/19 0716  WBC 7.2    Liver Function Tests: Recent Labs  Lab 01/09/19 0716  AST 31  ALT 25  ALKPHOS 70  BILITOT 0.5  PROT 7.1  ALBUMIN 3.6   No results for input(s): LIPASE, AMYLASE in the last 168 hours. No results for input(s): AMMONIA in the last 168 hours.  ABG No results found for: PHART, PCO2ART, PO2ART, HCO3, TCO2, ACIDBASEDEF, O2SAT   Coagulation Profile: Recent Labs  Lab 01/09/19 0716  INR 1.0    Cardiac Enzymes: No results for input(s): CKTOTAL, CKMB, CKMBINDEX, TROPONINI in the last 168 hours.  HbA1C: No results found for: HGBA1C  CBG: No results for input(s): GLUCAP in the last 168 hours.   Richardson Landry Minor ACNP Acute Care Nurse Practitioner [place] Please consult Amion 01/10/2019, 11:32 AM

## 2019-01-11 ENCOUNTER — Telehealth: Payer: Self-pay | Admitting: Pulmonary Disease

## 2019-01-11 ENCOUNTER — Ambulatory Visit: Payer: Managed Care, Other (non HMO) | Admitting: Pulmonary Disease

## 2019-01-11 ENCOUNTER — Encounter: Payer: Self-pay | Admitting: *Deleted

## 2019-01-11 ENCOUNTER — Inpatient Hospital Stay (HOSPITAL_COMMUNITY): Payer: Managed Care, Other (non HMO)

## 2019-01-11 DIAGNOSIS — J9601 Acute respiratory failure with hypoxia: Secondary | ICD-10-CM

## 2019-01-11 LAB — CYTOLOGY - NON PAP

## 2019-01-11 LAB — CULTURE, RESPIRATORY W GRAM STAIN: Culture: NO GROWTH

## 2019-01-11 LAB — SURGICAL PATHOLOGY

## 2019-01-11 MED ORDER — ALBUTEROL SULFATE HFA 108 (90 BASE) MCG/ACT IN AERS: 2.0000 | INHALATION_SPRAY | Freq: Four times a day (QID) | RESPIRATORY_TRACT | 0 refills | Status: DC | PRN

## 2019-01-11 NOTE — Telephone Encounter (Signed)
PCCM:  I attempted to call the patient regarding his pathology results from transbronchial biopsies.  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  I have referred the patient to medical oncology.  The oncology for [facility] also reach out to him to help schedule an appointment to see Dr. Earlie Server.  I will try to reach out to the patient again later.  He was discharged earlier today from the hospital.  Garner Nash, DO [facility] 01/11/2019 4:51 PM

## 2019-01-11 NOTE — Progress Notes (Signed)
SATURATION QUALIFICATIONS: (This note is used to comply with regulatory documentation for home oxygen)  Patient Saturations on Room Air at Rest = 96%  Patient Saturations on Room Air while Ambulating = 91%  Patient Saturations on 0 Liters of oxygen while Ambulating = 91%  Please briefly explain why patient needs home oxygen: pt saturation did not drop below 91% while on room air

## 2019-01-11 NOTE — Discharge Instructions (Signed)
Follow up with Charlesetta Garibaldi NP 12/14 at 1130   Flexible Bronchoscopy, Care After This sheet gives you information about how to care for yourself after your test. Your doctor may also give you more specific instructions. If you have problems or questions, contact your doctor. Follow these instructions at home: Eating and drinking  The day after the test, go back to your normal diet. Driving  Do not drive for 24 hours if you were given a medicine to help you relax (sedative).  Do not drive or use heavy machinery while taking prescription pain medicine. General instructions   Take over-the-counter and prescription medicines only as told by your doctor.  Return to your normal activities as told. Ask what activities are safe for you.  Do not use any products that have nicotine or tobacco in them. This includes cigarettes and e-cigarettes. If you need help quitting, ask your doctor.  Keep all follow-up visits as told by your doctor. This is important. It is very important if you had a tissue sample (biopsy) taken. Get help right away if:  You have shortness of breath that gets worse.  You get light-headed.  You feel like you are going to pass out (faint).  You have chest pain.  You cough up: ? More than a little blood. ? More blood than before. Summary  Do not eat or drink anything (not even water) for 2 hours after your test, or until your numbing medicine wears off.  Do not use cigarettes. Do not use e-cigarettes.  Get help right away if you have chest pain. This information is not intended to replace advice given to you by your health care provider. Make sure you discuss any questions you have with your health care provider. Document Released: 11/16/2008 Document Revised: 01/01/2017 Document Reviewed: 02/07/2016 Elsevier Patient Education  2020 [employer]  A lung mass is a growth in the lung that is larger than 3 centimeters (1.2 inches). Smaller  growths are called nodules. Most lung nodules are not cancer. Lung masses have a higher risk of being cancer. A lung mass is sometimes found during a routine chest X-ray or while doing other imaging tests to check for other problems. What are common types of lung masses? Lung masses include:  Tumors. These may be cancerous (malignant) or noncancerous (benign).  Infectious masses (granulomas). These are masses caused by inflammation from bacterial infections, like tuberculosis, or fungal infections.  Noninfectious masses. Some diseases that cause lung inflammation may also cause lung masses to form.  Blood vessel malformations. What type of testing may be needed? Your health care provider may recommend that you have tests to diagnose the cause of your lung mass. The following tests may be done if a lung mass is found:  Physical exam.  Imaging tests, such as: ? Chest X-rays. ? CT scan. ? PET scan. This scan measures how much energy a mass is using.  Biopsy to rule out cancer or confirm a diagnosis. This procedure involves removing a tissue sample from the mass with a needle inserted through the chest, using a scope placed down into the lung, or through open surgery. Tests and physical exams may be done once, or they may be done regularly for a period of time. Tests and exams that are done regularly will help monitor whether the mass or tissue change is growing and becoming a concern. What are common treatments? Treatment for a lung mass depends on the cause. Noncancerous masses may  require treatment specific to the cause. Treatment options for a cancerous lung mass may include:  Surgical removal.  Radiation therapy.  Chemotherapy. Follow these instructions at home: If you have had surgery or a biopsy, your health care provider will give you specific instructions for taking care of yourself at home after your procedure. Follow these instructions carefully. General home care instructions  include:  Take over-the-counter and prescription medicines only as told by your health care provider.  Return to your normal activities as told by your health care provider. Ask your health care provider what activities are safe for you.  Do not use any products that contain nicotine or tobacco, such as cigarettes, e-cigarettes, and chewing tobacco. If you need help quitting, ask your health care provider.  Keep all follow-up visits as told by your health care provider. This is important. Contact a health care provider if you:  Have pain in your chest, back, or shoulder.  Are short of breath.  Have a cough.  Cough up blood or bloody sputum. Summary  A lung mass is a growth in the lung that is larger than 3 centimeters (1.2 inches).  Lung masses have a higher risk of being cancer than do smaller growths.  Sometimes a lung mass is found during a routine chest X-ray or other imaging test.  Your health care provider may do a biopsy of the lung mass to rule out or confirm cancer.  Treatment for this condition depends on the cause. This information is not intended to replace advice given to you by your health care provider. Make sure you discuss any questions you have with your health care provider. Document Released: 09/14/2017 Document Revised: 05/13/2018 Document Reviewed: 09/14/2017 Elsevier Patient Education  2020 [employer].

## 2019-01-11 NOTE — Progress Notes (Signed)
Oncology Nurse Navigator Documentation  Oncology Nurse Navigator Flowsheets 01/11/2019  Navigator Location [facility]  Referral Date to RadOnc/MedOnc 01/11/2019  Navigator Encounter Type Other/I received referral from Dr. Valeta Harms today.  Dr. Julien Nordmann is aware of referral and request tissue [id number] to be sent for foundation one and PDL 1 testing.  I updated pathology on request.  Patient is currently in-patient and will be scheduled to see Dr. Julien Nordmann after discharge.   Patient Visit Type Inpatient  Treatment Phase Pre-Tx/Tx Discussion  Barriers/Navigation Needs Coordination of Care  Interventions Coordination of Care  Acuity Level 2-Minimal Needs (1-2 Barriers Identified)  Coordination of Care Other  Time Spent with Patient 30

## 2019-01-11 NOTE — Discharge Summary (Addendum)
Physician Discharge Summary  Patient ID: Philip [name] MRN: [id number] DOB/AGE: October 28, 1955 63 y.o.  Admit date: 01/09/2019 Discharge date: 01/11/2019    Discharge Diagnoses:  -Right-sided postprocedural pneumothorax, resolved  -Right-sided hilar lung mass, mediastinal hilar adenopathy -Status post video bronchoscopy with endobronchial ultrasound-guided transbronchial needle aspiration biopsies to the mediastinal adenopathy as well as hilar mass -Status post transbronchial biopsies, forceps to the right upper lobe and right lower lobe -Abnormal PET scan of the chest concerning for malignancy -Severe emphysema -Hypoxia -Coughing paroxysms                                          DISCHARGE PLAN BY DIAGNOSIS    Discharge Plan: F/u chest xray 12/9 with resolved pneumothorax  Follow up with outpatient pulmonologist 12/19 PRN short acting bronchodilator inhaler at discharge        DISCHARGE SUMMARY   63 year old gentleman past medical history of tobacco abuse initial abnormal lung cancer screening CT on November 2020.  Patient underwent original bronchoscopy on 12/22/2018 which was nondiagnostic.  Patient had PET scan with abnormal PET avid lymphadenopathy mediastinal adenopathy and mass within the right hilum.  Patient was taken for repeat bronchoscopy on 01/09/2019.  This included multiple transbronchial biopsies to the right upper lobe and right lower lobe superior segment. Ultimately having a small postprocedural pneumothorax.  Decision was made for admission to the hospital for observation on oxygen therapy with repeat chest x-ray in the morning. As of 12/9 pneumothorax has resolved per a.m. chest x-ray.  Patient was ambulated on room air and did not met criteria for supplemental oxygen. Patient remained stable, ambulating per baseline, and tolerating oral diet therefore, patient will be discharged home today.  Patient to follow-up with outpatient pulmonologist next week for reevaluation.    SIGNIFICANT DIAGNOSTIC STUDIES Chest xray 12/9 > resolved pneumothorax, no active disease  MICRO DATA  AFB smear 12/8 > negative Respiratory culture 12/7 > no growth to date  ANTIBIOTICS None  CONSULTS None  TUBES / LINES Not applicable   Discharge Exam: General: Well-appearing elderly male, in no acute distress Neuro:Alert and oriented with no acute complaints, states he is ready to be discharged home CV: S1-S2, regular rate and rhythm PULM: Very faint expiratory wheeze, no increased work of breathing GI: Active bowel sounds in all fields, tolerating diet Extremities: No cyanosis, no edema  Vitals:   01/10/19 0749 01/10/19 1734 01/10/19 2320 01/11/19 0836  BP: 99/60 106/74 104/69 (!) 107/94  Pulse: (!) 51 65 (!) 55 61  Resp: 19 18 15 19   Temp: 98.2 F (36.8 C) 98 F (36.7 C) 97.8 F (36.6 C) 97.8 F (36.6 C)  TempSrc:   Oral   SpO2: 100% 100% 100% 99%  Weight:      Height:         Discharge Labs  BMET Recent Labs  Lab 01/09/19 0716  NA 141  K 4.7  CL 110  CO2 22  GLUCOSE 101*  BUN 18  CREATININE 0.94  CALCIUM 8.9    CBC Recent Labs  Lab 01/09/19 0716  HGB 16.3  HCT 49.4  WBC 7.2  PLT 264    Anti-Coagulation Recent Labs  Lab 01/09/19 0716  INR 1.0    Discharge Instructions    Discharge patient   Complete by: As directed    Discharge disposition: 01-Home or Self Care   Discharge patient date:  01/09/2019       Follow-up Information    [name], MD.   Specialty: Family Medicine Contact information: [place] [address] [phone]        Martyn Ehrich, NP Follow up on 01/16/2019.   Specialty: Pulmonary Disease Why: At 11:30 Contact information: [place] [address] [phone]            Allergies as of 01/11/2019      Reactions   Norco [hydrocodone-acetaminophen] Other (See Comments)   Made pt feel jittery       Medication List     TAKE these medications   albuterol 108 (90 Base) MCG/ACT inhaler Commonly known as: VENTOLIN HFA Inhale 2 puffs into the lungs every 6 (six) hours as needed for wheezing or shortness of breath.   aspirin 325 MG tablet Take 650 mg by mouth daily as needed for moderate pain.   atorvastatin 20 MG tablet Commonly known as: LIPITOR Take 20 mg by mouth daily.   bismuth subsalicylate 200 YH/59XQ suspension Commonly known as: PEPTO BISMOL Take 30 mLs by mouth every 6 (six) hours as needed for indigestion.   GOODY HEADACHE PO Take 1 packet by mouth daily as needed (headaches).   sildenafil 20 MG tablet Commonly known as: REVATIO Take 20-60 mg by mouth daily as needed (ED).   VISINE OP Place 1 drop into both eyes daily as needed (redness).       Disposition: Home  Discharged Condition: Philip [name] has met maximum benefit of inpatient care and is medically stable and cleared for discharge.  Patient is pending follow up as above.     Time spent on disposition:  38 Minutes.   Signed: Johnsie Cancel, NP-C [facility] After hours pager: [phone]. 01/11/2019, 11:35 AM

## 2019-01-12 ENCOUNTER — Encounter: Payer: Self-pay | Admitting: *Deleted

## 2019-01-12 ENCOUNTER — Telehealth: Payer: Self-pay | Admitting: *Deleted

## 2019-01-12 DIAGNOSIS — R918 Other nonspecific abnormal finding of lung field: Secondary | ICD-10-CM

## 2019-01-12 NOTE — Progress Notes (Signed)
Patient called me back.  I updated him on his appt to be seen with Dr. Julien Nordmann 01/13/2019. He verbalized understanding of appt

## 2019-01-12 NOTE — Telephone Encounter (Signed)
Oncology Nurse Navigator Documentation  Oncology Nurse Navigator Flowsheets 01/12/2019  Navigator Location [facility]  Referral Date to RadOnc/MedOnc -  Navigator Encounter Type Telephone/I received referral on [name]. I called to scheduled him to see Dr. Julien Nordmann but was unable to reach him.  I did leave vm message for him to call me with my name and phone number.   Telephone Outgoing Call  Patient Visit Type -  Treatment Phase Pre-Tx/Tx Discussion  Barriers/Navigation Needs Coordination of Care;Education  Education Other  Interventions Coordination of Care;Education  Acuity Level 2-Minimal Needs (1-2 Barriers Identified)  Coordination of Care Other  Time Spent with Patient 15

## 2019-01-13 ENCOUNTER — Encounter: Payer: Self-pay | Admitting: Internal Medicine

## 2019-01-13 ENCOUNTER — Inpatient Hospital Stay: Payer: Managed Care, Other (non HMO) | Attending: Internal Medicine | Admitting: Internal Medicine

## 2019-01-13 ENCOUNTER — Inpatient Hospital Stay: Payer: Managed Care, Other (non HMO)

## 2019-01-13 ENCOUNTER — Other Ambulatory Visit: Payer: Self-pay

## 2019-01-13 VITALS — BP 101/79 | HR 87 | Temp 97.7°F | Resp 18 | Ht 66.0 in | Wt 178.9 lb

## 2019-01-13 DIAGNOSIS — C3491 Malignant neoplasm of unspecified part of right bronchus or lung: Secondary | ICD-10-CM | POA: Insufficient documentation

## 2019-01-13 DIAGNOSIS — Z716 Tobacco abuse counseling: Secondary | ICD-10-CM

## 2019-01-13 DIAGNOSIS — Z5111 Encounter for antineoplastic chemotherapy: Secondary | ICD-10-CM | POA: Diagnosis not present

## 2019-01-13 DIAGNOSIS — C3411 Malignant neoplasm of upper lobe, right bronchus or lung: Secondary | ICD-10-CM | POA: Diagnosis not present

## 2019-01-13 DIAGNOSIS — Z7189 Other specified counseling: Secondary | ICD-10-CM | POA: Insufficient documentation

## 2019-01-13 DIAGNOSIS — R918 Other nonspecific abnormal finding of lung field: Secondary | ICD-10-CM | POA: Diagnosis not present

## 2019-01-13 LAB — CMP (CANCER CENTER ONLY)
ALT: 31 U/L (ref 0–44)
AST: 19 U/L (ref 15–41)
Albumin: 3.8 g/dL (ref 3.5–5.0)
Alkaline Phosphatase: 82 U/L (ref 38–126)
Anion gap: 8 (ref 5–15)
BUN: 15 mg/dL (ref 8–23)
CO2: 29 mmol/L (ref 22–32)
Calcium: 9.6 mg/dL (ref 8.9–10.3)
Chloride: 101 mmol/L (ref 98–111)
Creatinine: 1.09 mg/dL (ref 0.61–1.24)
GFR, Est AFR Am: >60 mL/min (ref >60)
GFR, Estimated: >60 mL/min (ref >60)
Glucose, Bld: 111 mg/dL — ABNORMAL HIGH (ref 70–99)
Potassium: 4.8 mmol/L (ref 3.5–5.1)
Sodium: 138 mmol/L (ref 135–145)
Total Bilirubin: 0.5 mg/dL (ref 0.3–1.2)
Total Protein: 7.8 g/dL (ref 6.5–8.1)

## 2019-01-13 LAB — CBC WITH DIFFERENTIAL (CANCER CENTER ONLY)
Abs Immature Granulocytes: 0.03 10*3/uL (ref 0.00–0.07)
Basophils Absolute: 0.1 10*3/uL (ref 0.0–0.1)
Basophils Relative: 1 %
Eosinophils Absolute: 0.3 10*3/uL (ref 0.0–0.5)
Eosinophils Relative: 3 %
HCT: 52.5 % — ABNORMAL HIGH (ref 39.0–52.0)
Hemoglobin: 17.7 g/dL — ABNORMAL HIGH (ref 13.0–17.0)
Immature Granulocytes: 0 %
Lymphocytes Relative: 27 %
Lymphs Abs: 2.6 10*3/uL (ref 0.7–4.0)
MCH: 31.4 pg (ref 26.0–34.0)
MCHC: 33.7 g/dL (ref 30.0–36.0)
MCV: 93.1 fL (ref 80.0–100.0)
Monocytes Absolute: 0.9 10*3/uL (ref 0.1–1.0)
Monocytes Relative: 10 %
Neutro Abs: 5.5 10*3/uL (ref 1.7–7.7)
Neutrophils Relative %: 59 %
Platelet Count: 296 10*3/uL (ref 150–400)
RBC: 5.64 MIL/uL (ref 4.22–5.81)
RDW: 12 % (ref 11.5–15.5)
WBC Count: 9.4 10*3/uL (ref 4.0–10.5)
nRBC: 0 % (ref 0.0–0.2)

## 2019-01-13 MED ORDER — PROCHLORPERAZINE MALEATE 10 MG PO TABS: 10.0000 mg | ORAL_TABLET | Freq: Four times a day (QID) | ORAL | 2 refills | Status: DC | PRN

## 2019-01-13 NOTE — Progress Notes (Signed)
START ON PATHWAY REGIMEN - Non-Small Cell Lung     Administer weekly:     Paclitaxel      Carboplatin   **Always confirm dose/schedule in your pharmacy ordering system**  Patient Characteristics: Stage III - Unresectable, PS = 0, 1 AJCC T Category: T3 Current Disease Status: No Distant Mets or Local Recurrence AJCC N Category: N3 AJCC M Category: M0 AJCC 8 Stage Grouping: IIIC ECOG Performance Status: 1 Intent of Therapy: Curative Intent, Discussed with Patient

## 2019-01-13 NOTE — Patient Instructions (Addendum)
-  There are two main categories of lung cancer, they are named based on the size of the cancer cell. One is called Non-Small cell lung cancer. The other type is Small Cell Lung Cancer -The sample (biopsy) that they took of your tumor was consistent with a subtype of Non-small cell lung cancer called Adenocarcinoma  -We covered a lot of important information at your appointment today regarding what the treatment plan is moving forward. Here are the the main points that were discussed at your office visit with [place] today:  -The treatment that you will receive consists of two chemotherapy drugs called Carboplatin and Paclitaxel (also called Taxol) -We are planning on starting your treatment next week on 01/30/2019 but before your start your treatment, I would like you to attend a Chemotherapy Education Class. This involves having you sit down with one of our nurse educators. She will discuss with you one-on-one more details about your treatment as well as general information about resources here at the [facility] treatment will be given every week for about 6 weeks or so (as long as you are also receiving radiation). We will check your labs (blood work) once a week . Dr. Julien Nordmann or [facility] PA will see you every other treatment just to make sure that you are doing well and that everything is on track. -We will get a CT scan about ~3 weeks after you complete your treatment  Medications:  -Compazine was sent to your pharmacy. This medication is for nausea. You may take this every 6 hours as needed if you feel nausous.   Referrals -Be on the look out for a call from the radiology department to schedule your brain MRI next week. In case you need to contact the radiology department, their number is [phone] -Be on the lookout for a call from the radiation oncologist (the radiation doctors) for your initial visit with them. The radiation doctors are located downs  [facility] [place]:  -We will test the  sample of the cancer (biopsy) for further testing. This is something that we may need to know in the future.   Follow Up:  -We will see you back for a follow up visit around 02/05/2018 [facility] your second dose of chemotherapy.

## 2019-01-13 NOTE — Progress Notes (Signed)
[place] Telephone:([phone]) [phone]   [phone]  CONSULT NOTE  REFERRING PHYSICIAN: Dr. Leory Plowman Icard  REASON FOR CONSULTATION:  63 years old white male recently diagnosed with lung cancer.  HPI Philip [name] is a 63 y.o. male with past medical history significant for recurrent pneumonia, dyslipidemia, hand surgery as well as long history for smoking.  The patient was seen by his primary care physician in early November 2020 and because of his long history for smoking and cough productive of clear foamy mucus CT screening of the chest was performed on December 09, 2018 and it showed a large area of masslike architecture distortion within the perihilar right lung involving both the right upper lobe and right lower lobe.  This has an equivalent diameter of 4.9 cm.  The scan also showed left AP window lymph node measuring 1.6 cm as well as right paratracheal node that is borderline enlarged and measured 1.3 cm.  The patient was seen by Dr. Valeta Harms and he underwent bronchoscopy on December 22, 2018 but it was not conclusive for malignancy.  A PET scan was performed on January 03, 2019 and it showed hypermetabolic right hilar mass with the bulk of the lesion in the right upper lobe with abnormal bandlike transfusion or hypermetabolic extension into the right middle lobe and right lower lobe compatible with malignancy.  The tumor extends up to 15.5 cm in greatest length.  There is bilateral hypermetabolic mediastinal and hilar adenopathy.  The appearance was compatible with T4, N3, M0 disease.  On January 09, 2019 the patient underwent repeat bronchoscopy with endobronchial ultrasound procedure under the care of Dr. Valeta Harms.  The final pathology ([id number]) showed malignant cells consistent with adenocarcinoma.  The material were insufficient for molecular studies. The patient was referred to me today for evaluation and recommendation regarding treatment of his condition. When  seen today he continues to have cough as well as shortness of breath with exertion but no significant chest pain or hemoptysis.  He denied having any nausea, vomiting, diarrhea or constipation.  He denied having any headache or visual changes. Family history significant for father with stomach cancer and mother is still alive.  Sister had uterine cancer. The patient is married and has 3 daughters.  He was accompanied today by his wife Philip [name].  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  He has a history of smoking up to 2 pack/day for around 50 years and quit 5 days ago.  He drinks alcohol on the weekend and he also has a history of drug abuse in the past but not now.  He used marijuana occasionally.  HPI  Past Medical History:  Diagnosis Date  . Hyperlipidemia   . Pneumonia   . Tobacco abuse     Past Surgical History:  Procedure Laterality Date  . COLONOSCOPY    . HAND SURGERY    . VIDEO BRONCHOSCOPY WITH ENDOBRONCHIAL ULTRASOUND N/A 12/22/2018   Procedure: VIDEO BRONCHOSCOPY WITH ENDOBRONCHIAL ULTRASOUND;  Surgeon: Garner Nash, DO;  Location: [facility];  Service: Thoracic;  Laterality: N/A;  . VIDEO BRONCHOSCOPY WITH ENDOBRONCHIAL ULTRASOUND N/A 01/09/2019   Procedure: VIDEO BRONCHOSCOPY WITH ENDOBRONCHIAL ULTRASOUND WITH FLUORO;  Surgeon: Garner Nash, DO;  Location: [facility];  Service: Thoracic;  Laterality: N/A;  . VIDEO BRONCHOSCOPY WITH RADIAL ENDOBRONCHIAL ULTRASOUND N/A 12/22/2018   Procedure: RADIAL ENDOBRONCHIAL ULTRASOUND;  Surgeon: Garner Nash, DO;  Location: [facility];  Service: Thoracic;  Laterality: N/A;    Family History  Problem Relation Age of Onset  . Cancer Father   . Stomach cancer Father     Social History Social History   Tobacco Use  . Smoking status: Current Every Day Smoker    Packs/day: 1.00    Years: 45.00    Pack years: 45.00    Types: Cigarettes    Start date: 07/04/1970  . Smokeless tobacco: Former  Systems developer    Types: Chew  Substance Use Topics  . Alcohol use: Yes    Comment: drinks 1/2 5th every weekend  . Drug use: Not Currently    Types: Marijuana    Comment: none for 20 years    Allergies  Allergen Reactions  . Norco [Hydrocodone-Acetaminophen] Other (See Comments)    Made pt feel jittery     Current Outpatient Medications  Medication Sig Dispense Refill  . aspirin 325 MG tablet Take 650 mg by mouth daily as needed for moderate pain.    . Aspirin-Acetaminophen-Caffeine (GOODY HEADACHE PO) Take 1 packet by mouth daily as needed (headaches).    Marland Kitchen atorvastatin (LIPITOR) 20 MG tablet Take 20 mg by mouth daily.    Marland Kitchen bismuth subsalicylate (PEPTO BISMOL) 262 MG/15ML suspension Take 30 mLs by mouth every 6 (six) hours as needed for indigestion.    . sildenafil (REVATIO) 20 MG tablet Take 20-60 mg by mouth daily as needed (ED).     . Tetrahydrozoline HCl (VISINE OP) Place 1 drop into both eyes daily as needed (redness).    Marland Kitchen albuterol (VENTOLIN HFA) 108 (90 Base) MCG/ACT inhaler Inhale 2 puffs into the lungs every 6 (six) hours as needed for wheezing or shortness of breath. (Patient not taking: Reported on 01/13/2019) 1 g 0   No current facility-administered medications for this visit.    Review of Systems  Constitutional: positive for fatigue Eyes: negative Ears, nose, mouth, throat, and face: negative Respiratory: positive for cough and dyspnea on exertion Cardiovascular: negative Gastrointestinal: negative Genitourinary:negative Integument/breast: negative Hematologic/lymphatic: negative Musculoskeletal:negative Neurological: negative Behavioral/Psych: negative Endocrine: negative Allergic/Immunologic: negative  Physical Exam  XFG:HWEXH, healthy, no distress, well nourished, well developed and anxious SKIN: skin color, texture, turgor are normal, no rashes or significant lesions HEAD: Normocephalic, No masses, lesions, tenderness or abnormalities EYES: normal,  PERRLA, Conjunctiva are pink and non-injected EARS: External ears normal, Canals clear OROPHARYNX:no exudate, no erythema and lips, buccal mucosa, and tongue normal  NECK: supple, no adenopathy, no JVD LYMPH:  no palpable lymphadenopathy, no hepatosplenomegaly LUNGS: clear to auscultation , and palpation HEART: regular rate & rhythm, no murmurs and no gallops ABDOMEN:abdomen soft, non-tender, normal bowel sounds and no masses or organomegaly BACK: Back symmetric, no curvature., No CVA tenderness EXTREMITIES:no joint deformities, effusion, or inflammation, no edema  NEURO: alert & oriented x 3 with fluent speech, no focal motor/sensory deficits  PERFORMANCE STATUS: ECOG 1  LABORATORY DATA: Lab Results  Component Value Date   WBC 9.4 01/13/2019   HGB 17.7 (H) 01/13/2019   HCT 52.5 (H) 01/13/2019   MCV 93.1 01/13/2019   PLT 296 01/13/2019      Chemistry      Component Value Date/Time   NA 141 01/09/2019 0716   K 4.7 01/09/2019 0716   CL 110 01/09/2019 0716   CO2 22 01/09/2019 0716   BUN 18 01/09/2019 0716   CREATININE 0.94 01/09/2019 0716      Component Value Date/Time   CALCIUM 8.9 01/09/2019 0716   ALKPHOS 70 01/09/2019 0716  AST 31 01/09/2019 0716   ALT 25 01/09/2019 0716   BILITOT 0.5 01/09/2019 0716       RADIOGRAPHIC STUDIES: NM PET - Initial Skull Base To Thigh  Result Date: 01/03/2019 CLINICAL DATA:  Initial treatment strategy for non-small cell right lung cancer. EXAM: NUCLEAR MEDICINE PET SKULL BASE TO THIGH TECHNIQUE: 8.9 mCi F-18 FDG was injected intravenously. Full-ring PET imaging was performed from the skull base to thigh after the radiotracer. CT data was obtained and used for attenuation correction and anatomic localization. Fasting blood glucose: 113 mg/dl COMPARISON:  Chest CT 12/09/2018 FINDINGS: Mediastinal blood pool activity: SUV max 0.4 Liver activity: SUV max N/A NECK: No significant abnormal hypermetabolic activity in this region. Incidental CT  findings: Mild right common carotid atherosclerotic calcification. CHEST: Hypermetabolic right hilar mass includes components in the right upper lobe (max SUV 6.6), right middle lobe (max SUV 4.8), and right lower lobe (maximum SUV 4.6). Hypermetabolic prevascular, right paratracheal, AP window, left hilar, paratracheal subcarinal adenopathy. Index AP window lymph node 1.6 cm in short axis on image 72/4, maximum SUV 4.8. Index right paratracheal lymph node 1.1 cm in short axis also on image 72/4, maximum SUV 4.4. Incidental CT findings: Atherosclerotic calcification of the aortic arch, left anterior descending, right coronary arteries. Upper normal heart size. Emphysema noted. ABDOMEN/PELVIS: Focal activity along the anal region, maximum SUV 0.6, probably physiologic nonspecific. No hypermetabolic activity associated with the low-density left adrenal mass which has density characteristics compatible with adenoma. Incidental CT findings: Aortoiliac atherosclerotic vascular disease. SKELETON: No significant abnormal hypermetabolic activity in this region. Incidental CT findings: none IMPRESSION: 1. Hypermetabolic right hilar mass with the bulk of the lesion in the right upper lobe with abnormal bandlike transfissural hypermetabolic extensions into the right middle lobe and right lower lobe, compatible with malignancy. Tumor extends up to 15.5 cm in greatest length. There is bilateral hypermetabolic mediastinal and hilar adenopathy. Assuming non-small cell lung cancer, appearance is compatible with T4 N3 M0 disease (stage IIIC). 2. Aortic Atherosclerosis (ICD10-I70.0) and Emphysema (ICD10-J43.9). Coronary atherosclerosis. Electronically Signed   By: Van Clines M.D.   On: 01/03/2019 08:48   DG Chest Port 1 View  Result Date: 01/11/2019 CLINICAL DATA:  Shortness of breath. EXAM: PORTABLE CHEST 1 VIEW COMPARISON:  January 10, 2019 FINDINGS: The heart size and mediastinal contours are within normal limits. No  pneumothorax or pleural effusion is noted. Grossly stable mild right midlung subsegmental atelectasis in left basilar subsegmental atelectasis is noted. The visualized skeletal structures are unremarkable. IMPRESSION: No active disease. Electronically Signed   By: Marijo Conception M.D.   On: 01/11/2019 08:13   Portable chest 1 View  Result Date: 01/10/2019 CLINICAL DATA:  Follow-up pneumothorax. EXAM: PORTABLE CHEST 1 VIEW COMPARISON:  Radiographs yesterday. FINDINGS: Decreased size of right apical pneumothorax since yesterday, possible tiny residual noted. Right hilar prominence is seen on prior exam, streaky right perihilar atelectasis. Unchanged heart size and mediastinal contours. No pleural fluid or new abnormality. IMPRESSION: 1. Decreased size of right pneumothorax since yesterday, possible tiny residual. 2. Unchanged right hilar prominence and perihilar atelectasis. Electronically Signed   By: Keith Rake M.D.   On: 01/10/2019 06:25   DG CHEST PORT 1 VIEW  Addendum Date: 01/09/2019   ADDENDUM REPORT: 01/09/2019 16:22 ADDENDUM: There is a small right apical pneumothorax. It is slightly larger when compared to the earlier film from today. IMPRESSION: Small right apical pneumothorax, slightly larger when compared to earlier film from same date. Electronically Signed  By: Marijo Sanes M.D.   On: 01/09/2019 16:22   Result Date: 01/09/2019 CLINICAL DATA:  Status post bronchoscopy. EXAM: PORTABLE CHEST 1 VIEW COMPARISON:  Chest x-ray 12/22/2018. PET-CT 01/03/2019 FINDINGS: The cardiac silhouette, mediastinal and hilar contours are stable. Persistent right lower lobe process. Low lung volumes with vascular crowding and bibasilar atelectasis. No postprocedural pneumothorax or hematoma. IMPRESSION: 1. Persistent right lower lobe process. 2. No postprocedural pneumothorax or hematoma. 3. Low lung volumes with vascular crowding and bibasilar atelectasis. Electronically Signed: By: Marijo Sanes M.D. On:  01/09/2019 15:26   DG CHEST PORT 1 VIEW  Result Date: 01/09/2019 CLINICAL DATA:  Post bronchoscopy with biopsy EXAM: PORTABLE CHEST 1 VIEW COMPARISON:  12/22/2018 FINDINGS: Possible small right apical pneumothorax. Right hilar mass is noted with perihilar atelectasis/consolidation. Normal heart size. IMPRESSION: Suspected small right apical pneumothorax. Follow-up is recommended. Electronically Signed   By: Macy Mis M.D.   On: 01/09/2019 12:53   DG CHEST PORT 1 VIEW  Result Date: 12/22/2018 CLINICAL DATA:  Status post bronchoscopy. EXAM: PORTABLE CHEST 1 VIEW COMPARISON:  Chest CT 12/09/2018 FINDINGS: The cardiac silhouette, mediastinal and hilar contours are within normal limits and stable. Advanced underlying emphysematous changes and persistent dense airspace opacification in the right lower lobe. New right upper lobe airspace process could be atelectasis or infiltrate. No pneumothorax or pleural hematoma. The left lung is grossly clear. IMPRESSION: 1. Status post bronchoscopy without evidence for pneumothorax or hematoma. 2. Persistent dense right lower lobe airspace process with air bronchograms. 3. New right upper lobe airspace opacity, atelectasis versus infiltrate. 4. Underlying chronic lung disease/emphysema. Electronically Signed   By: Marijo Sanes M.D.   On: 12/22/2018 13:49   DG C-ARM BRONCHOSCOPY  Result Date: 01/09/2019 C-ARM BRONCHOSCOPY: Fluoroscopy was utilized by the requesting physician.  No radiographic interpretation.   DG C-ARM BRONCHOSCOPY  Result Date: 12/22/2018 C-ARM BRONCHOSCOPY: Fluoroscopy was utilized by the requesting physician.  No radiographic interpretation.    ASSESSMENT: This is a very pleasant 63 years old white male recently diagnosed with a stage IIIc (T4, N3, M0) non-small cell lung cancer, adenocarcinoma presented with large right hilar mass in addition to bilateral hilar and mediastinal lymphadenopathy diagnosed in December 2020.  PLAN: I had a  lengthy discussion with the patient and his wife today about his current disease stage, prognosis and treatment options. I recommended for the patient to complete the staging work-up by ordering MRI of the brain to rule out brain metastasis.  I will also order Guardant CDX blood test for molecular studies because of the insufficient material from the biopsy. I personally and independently reviewed his the scan images and discussed the result and showed the images to the patient and his wife. I recommended for the patient a course of concurrent chemoradiation with weekly carboplatin for AUC of 2 and paclitaxel 45 mg/M2.  This will be followed by consolidation treatment with immunotherapy if the patient has no evidence for disease progression after the concurrent chemoradiation. I discussed with the patient the adverse effect of this treatment including but not limited to alopecia, myelosuppression, nausea and vomiting, peripheral neuropathy, liver or renal dysfunction. He is expected to start the first cycle of this treatment on January 30, 2019. I will refer the patient to radiation oncology for evaluation and discussion of the radiotherapy option. The patient and his wife agreed to the current plan. He will have a chemotherapy education class before the first dose of his treatment. For smoking cessation, I strongly encouraged  the patient to quit smoking. He will come back for follow-up visit on January 30, 2019 with the start of the first cycle of his treatment. The patient was advised to call immediately if he has any concerning symptoms in the interval. The patient voices understanding of current disease status and treatment options and is in agreement with the current care plan.  All questions were answered. The patient knows to call the clinic with any problems, questions or concerns. We can certainly see the patient much sooner if necessary.  Thank you so much for allowing me to participate  in the care of EXZAVIER RUDERMAN. I will continue to follow up the patient with you and assist in his care.  I spent 55 minutes counseling the patient face to face. The total time spent in the appointment was 80 minutes.  Disclaimer: This note was dictated with voice recognition software. Similar sounding words can inadvertently be transcribed and may not be corrected upon review.   Eilleen Kempf January 13, 2019, 10:33 AM

## 2019-01-16 ENCOUNTER — Inpatient Hospital Stay: Payer: Managed Care, Other (non HMO) | Admitting: Primary Care

## 2019-01-16 ENCOUNTER — Other Ambulatory Visit: Payer: Self-pay | Admitting: Physician Assistant

## 2019-01-16 ENCOUNTER — Telehealth: Payer: Self-pay | Admitting: Pulmonary Disease

## 2019-01-16 ENCOUNTER — Encounter: Payer: Self-pay | Admitting: *Deleted

## 2019-01-16 ENCOUNTER — Telehealth: Payer: Self-pay | Admitting: *Deleted

## 2019-01-16 DIAGNOSIS — C3491 Malignant neoplasm of unspecified part of right bronchus or lung: Secondary | ICD-10-CM

## 2019-01-16 NOTE — Telephone Encounter (Signed)
Spoke with patient. He stated that he was returning a call from Dr. Valeta Harms but figured Dr. Valeta Harms was calling him about his test results. He stated that he established with Dr. Julien Nordmann on 12/21 and will start treatment on 12/28. Advised him that I would let Dr. Valeta Harms know that he did call back. Also advised him to call us if he needed anything. He verbalized understanding.

## 2019-01-16 NOTE — Telephone Encounter (Signed)
Oncology Nurse Navigator Documentation  Oncology Nurse Navigator Flowsheets 01/16/2019  Abnormal Finding Date 12/09/2018  Confirmed Diagnosis Date 01/09/2019  Diagnosis Status Pending Molecular Studies  Navigator Follow Up Date: 01/19/2019  Navigator Follow Up Reason: Awaiting Molecular Testing  Navigator Location [facility]  Referral Date to RadOnc/MedOnc -  Navigator Encounter Type Telephone/I followed up on Philip [name] plan of care.  I checked patient portal to see about tissue and there is insuffiate material.  Dr. Worthy Flank note would like Guardant 360 but I did not see this test has been completed yet.  I updated Dr. Julien Nordmann to get more information.  I spoke to Philip [name] about treatment plan and we spoke for a while.  He is trying to understand his Dx.  I help to clarify his questions and concerns.   Telephone Outgoing Call  Patient Visit Type -  Treatment Phase Pre-Tx/Tx Discussion  Barriers/Navigation Needs Coordination of Care;Education  Education Newly Diagnosed Cancer Education;Understanding Cancer/ Treatment Options;Other  Interventions Coordination of Care;Education  Acuity Level 2-Minimal Needs (1-2 Barriers Identified)  Coordination of Care Other  Education Method Verbal  Time Spent with Patient 30

## 2019-01-17 ENCOUNTER — Other Ambulatory Visit: Payer: Self-pay | Admitting: Internal Medicine

## 2019-01-17 ENCOUNTER — Telehealth: Payer: Self-pay | Admitting: Internal Medicine

## 2019-01-17 LAB — CULTURE, RESPIRATORY W GRAM STAIN: Culture: NO GROWTH

## 2019-01-17 NOTE — Telephone Encounter (Signed)
PCCM:  I attempted to reach patient again. I just wanted to check on him and see if he needed anything. I did see that he established with Dr. Julien Nordmann.   Thanks  Garner Nash, DO [facility] 01/17/2019 8:39 AM

## 2019-01-17 NOTE — Telephone Encounter (Signed)
Scheduled per los. Called and left msg. Mailed printout  °

## 2019-01-18 NOTE — Telephone Encounter (Signed)
Called spoke with patient.  He stated that physically he is doing well, but wishes he had a different prognosis.  Patient will continue on Dr [facility] plan of action and will call this office if he needs anything.  Routing back to Dr. Valeta Harms to let him know that we were able to speak with patient.  Will sign off.

## 2019-01-19 ENCOUNTER — Telehealth: Payer: Self-pay | Admitting: *Deleted

## 2019-01-19 ENCOUNTER — Other Ambulatory Visit: Payer: Self-pay | Admitting: *Deleted

## 2019-01-19 DIAGNOSIS — C3491 Malignant neoplasm of unspecified part of right bronchus or lung: Secondary | ICD-10-CM

## 2019-01-19 NOTE — Progress Notes (Signed)
The proposed treatment discussed in cancer conference 01/19/19 is for discussion purpose only and is not a binding recommendation.  The patient was not physically examined nor present for their treatment options.  Therefore, final treatment plans cannot be decided.

## 2019-01-19 NOTE — Telephone Encounter (Signed)
Oncology Nurse Navigator Documentation  Oncology Nurse Navigator Flowsheets 01/19/2019  Abnormal Finding Date -  Confirmed Diagnosis Date -  Diagnosis Status -  Navigator Follow Up Date: -  Navigator Follow Up Reason: -  Navigator Location [facility]  Referral Date to RadOnc/MedOnc -  Navigator Encounter Type Telephone/I called to update Philip [name] about him needing Guardant 360 test due to not enough tissue for testing from biopsy.  He verbalized understanding.    Telephone Outgoing Call  Patient Visit Type -  Treatment Phase -  Barriers/Navigation Needs Coordination of Care;Education  Education Other  Interventions Coordination of Care;Education  Acuity Level 2-Minimal Needs (1-2 Barriers Identified)  Coordination of Care Other  Education Method Verbal  Time Spent with Patient 30

## 2019-01-20 LAB — FUNGAL ORGANISM REFLEX

## 2019-01-20 LAB — FUNGUS CULTURE WITH STAIN

## 2019-01-20 LAB — FUNGUS CULTURE RESULT

## 2019-01-23 ENCOUNTER — Other Ambulatory Visit: Payer: Self-pay

## 2019-01-23 ENCOUNTER — Ambulatory Visit (HOSPITAL_COMMUNITY)
Admission: RE | Admit: 2019-01-23 | Discharge: 2019-01-23 | Disposition: A | Discharge status: Home / Self Care | Payer: Managed Care, Other (non HMO) | Source: Ambulatory Visit | Attending: Physician Assistant | Admitting: Physician Assistant

## 2019-01-23 DIAGNOSIS — R918 Other nonspecific abnormal finding of lung field: Secondary | ICD-10-CM | POA: Diagnosis not present

## 2019-01-23 MED ORDER — GADOBUTROL 1 MMOL/ML IV SOLN
8.0000 mL | Freq: Once | INTRAVENOUS | Status: AC | PRN
  Administered 2019-01-23: 16:00:00 8 mL via INTRAVENOUS

## 2019-01-24 ENCOUNTER — Inpatient Hospital Stay: Payer: Managed Care, Other (non HMO)

## 2019-01-24 ENCOUNTER — Telehealth: Payer: Self-pay | Admitting: *Deleted

## 2019-01-24 ENCOUNTER — Other Ambulatory Visit: Payer: Self-pay

## 2019-01-29 NOTE — Progress Notes (Signed)
[name] OFFICE PROGRESS NOTE  [name], MD [address] [place] [address]  DIAGNOSIS: Stage IIIc (T4, N3, M0) non-small cell lung cancer, adenocarcinoma presented with large right hilar mass in addition to bilateral hilar and mediastinal lymphadenopathy diagnosed in December 2020.  PRIOR THERAPY: None  CURRENT THERAPY: Weekly concurrent chemoradiation with carboplatin for an AUC of 2 and paclitaxel 45 mg/m2. First dose starting 01/30/2019.   INTERVAL HISTORY: Philip [name] 63 y.o. [name] returns to the clinic for a follow up visit. The patient is feeling well today without any concerning complaints. The patient is scheduled to start his first cycle of weekly concurrent chemoradiation today. He had his consult with radiation oncologist, Dr. Sondra Come, this morning. He is planning to start his radiation treatment next week. He denies any fever, chills, night sweats, or weight loss. Denies any chest pain or hemoptysis. He reports his baseline dyspnea on exertion and mild cough which he attributes to his cigarette smoking. He is in the process of trying to cut back smoking. He has decreased his use from 2 packs per day to less than a 1/2 of a pack per day. Denies any nausea, vomiting, diarrhea, or constipation. Denies any headache or visual changes. He recently had his initial staging brain MRI which was negative for metastatic disease. The patient is here today for evaluation prior to starting cycle #1    MEDICAL HISTORY: Past Medical History:  Diagnosis Date  . Hyperlipidemia   . Pneumonia   . Tobacco abuse     ALLERGIES:  is allergic to norco [hydrocodone-acetaminophen].  MEDICATIONS:  Current Outpatient Medications  Medication Sig Dispense Refill  . aspirin 325 MG tablet Take 650 mg by mouth daily as needed for moderate pain.    Marland Kitchen atorvastatin (LIPITOR) 20 MG tablet Take 20 mg by mouth daily.    Marland Kitchen albuterol (VENTOLIN HFA) 108 (90 Base)  MCG/ACT inhaler Inhale 2 puffs into the lungs every 6 (six) hours as needed for wheezing or shortness of breath. (Patient not taking: Reported on 01/13/2019) 1 g 0  . Aspirin-Acetaminophen-Caffeine (GOODY HEADACHE PO) Take 1 packet by mouth daily as needed (headaches).    . bismuth subsalicylate (PEPTO BISMOL) 262 MG/15ML suspension Take 30 mLs by mouth every 6 (six) hours as needed for indigestion.    . prochlorperazine (COMPAZINE) 10 MG tablet Take 1 tablet (10 mg total) by mouth every 6 (six) hours as needed for nausea or vomiting. (Patient not taking: Reported on 01/30/2019) 30 tablet 2  . sildenafil (REVATIO) 20 MG tablet Take 20-60 mg by mouth daily as needed (ED).     . Tetrahydrozoline HCl (VISINE OP) Place 1 drop into both eyes daily as needed (redness).     No current facility-administered medications for this visit.    SURGICAL HISTORY:  Past Surgical History:  Procedure Laterality Date  . COLONOSCOPY    . HAND SURGERY    . VIDEO BRONCHOSCOPY WITH ENDOBRONCHIAL ULTRASOUND N/A 12/22/2018   Procedure: VIDEO BRONCHOSCOPY WITH ENDOBRONCHIAL ULTRASOUND;  Surgeon: Garner Nash, DO;  Location: [facility];  Service: Thoracic;  Laterality: N/A;  . VIDEO BRONCHOSCOPY WITH ENDOBRONCHIAL ULTRASOUND N/A 01/09/2019   Procedure: VIDEO BRONCHOSCOPY WITH ENDOBRONCHIAL ULTRASOUND WITH FLUORO;  Surgeon: Garner Nash, DO;  Location: [facility];  Service: Thoracic;  Laterality: N/A;  . VIDEO BRONCHOSCOPY WITH RADIAL ENDOBRONCHIAL ULTRASOUND N/A 12/22/2018   Procedure: RADIAL ENDOBRONCHIAL ULTRASOUND;  Surgeon: Garner Nash, DO;  Location: [facility];  Service:  Thoracic;  Laterality: N/A;    REVIEW OF SYSTEMS:   Review of Systems  Constitutional: Negative for appetite change, chills, fatigue, fever and unexpected weight change.  HENT:   Negative for mouth sores, nosebleeds, sore throat and trouble swallowing.   Eyes: Negative for eye problems and icterus.  Respiratory: Positive for baseline cough or  shortness of breath with exertion. Negative for hemoptysis and wheezing.  Cardiovascular: Negative for chest pain and leg swelling.  Gastrointestinal: Negative for abdominal pain, constipation, diarrhea, nausea and vomiting.  Genitourinary: Negative for bladder incontinence, difficulty urinating, dysuria, frequency and hematuria.   Musculoskeletal: Negative for back pain, gait problem, neck pain and neck stiffness.  Skin: Negative for itching and rash.  Neurological: Negative for dizziness, extremity weakness, gait problem, headaches, light-headedness and seizures.  Hematological: Negative for adenopathy. Does not bruise/bleed easily.  Psychiatric/Behavioral: Negative for confusion, depression and sleep disturbance. The patient is not nervous/anxious.     PHYSICAL EXAMINATION:  Blood pressure 111/74, pulse 65, temperature 98 F (36.7 C), temperature source Temporal, resp. rate 15, height 5\' 6"  (1.676 m), weight 186 lb 8 oz (84.6 kg), SpO2 98 %.  ECOG PERFORMANCE STATUS: 1 - Symptomatic but completely ambulatory  Physical Exam  Constitutional: Oriented to person, place, and time and well-developed, well-nourished, and in no distress. HENT:  Head: Normocephalic and atraumatic.  Mouth/Throat: Oropharynx is clear and moist. No oropharyngeal exudate.  Eyes: Conjunctivae are normal. Right eye exhibits no discharge. Left eye exhibits no discharge. No scleral icterus.  Neck: Normal range of motion. Neck supple.  Cardiovascular: Normal rate, regular rhythm, normal heart sounds and intact distal pulses.   Pulmonary/Chest: Effort normal. Quiet breath sounds in all lung fields. No respiratory distress. No wheezes. No rales.  Abdominal: Soft. Bowel sounds are normal. Exhibits no distension and no mass. There is no tenderness.  Musculoskeletal: Normal range of motion. Exhibits no edema.  Lymphadenopathy:    No cervical adenopathy.  Neurological: Alert and oriented to person, place, and time. Exhibits  normal muscle tone. Gait normal. Coordination normal.  Skin: Skin is warm and dry. No rash noted. Not diaphoretic. No erythema. No pallor.  Psychiatric: Mood, memory and judgment normal.  Vitals reviewed.  LABORATORY DATA: Lab Results  Component Value Date   WBC 8.3 01/30/2019   HGB 14.6 01/30/2019   HCT 43.4 01/30/2019   MCV 92.5 01/30/2019   PLT 241 01/30/2019      Chemistry      Component Value Date/Time   NA 136 01/30/2019 0926   K 4.3 01/30/2019 0926   CL 105 01/30/2019 0926   CO2 22 01/30/2019 0926   BUN 13 01/30/2019 0926   CREATININE 0.86 01/30/2019 0926      Component Value Date/Time   CALCIUM 8.7 (L) 01/30/2019 0926   ALKPHOS 70 01/30/2019 0926   AST 24 01/30/2019 0926   ALT 24 01/30/2019 0926   BILITOT 0.4 01/30/2019 0926       RADIOGRAPHIC STUDIES:  MR Brain W Wo Contrast  Result Date: 01/23/2019 CLINICAL DATA:  Lung cancer, initial staging EXAM: MRI HEAD WITHOUT AND WITH CONTRAST TECHNIQUE: Multiplanar, multiecho pulse sequences of the brain and surrounding structures were obtained without and with intravenous contrast. CONTRAST:  61mL GADAVIST GADOBUTROL 1 MMOL/ML IV SOLN COMPARISON:  2010, no recent imaging available FINDINGS: Brain: There is no acute infarction or intracranial hemorrhage. There is no intracranial mass, mass effect, or edema. There is no hydrocephalus or extra-axial fluid collection. Minimal patchy foci of T2 hyperintensity in  the supratentorial white matter are nonspecific and may reflect minor chronic microvascular ischemic changes. No abnormal enhancement. Vascular: Major vessel flow voids at the skull base are preserved. Skull and upper cervical spine: Normal marrow signal is preserved. Sinuses/Orbits: Mild mucosal thickening.  Orbits are unremarkable. Other: Sella is unremarkable.  Mastoid air cells are clear. IMPRESSION: No evidence of metastatic disease. Electronically Signed   By: Macy Mis M.D.   On: 01/23/2019 20:42   NM PET -  Initial Skull Base To Thigh  Result Date: 01/03/2019 CLINICAL DATA:  Initial treatment strategy for non-small cell right lung cancer. EXAM: NUCLEAR MEDICINE PET SKULL BASE TO THIGH TECHNIQUE: 8.9 mCi F-18 FDG was injected intravenously. Full-ring PET imaging was performed from the skull base to thigh after the radiotracer. CT data was obtained and used for attenuation correction and anatomic localization. Fasting blood glucose: 113 mg/dl COMPARISON:  Chest CT 12/09/2018 FINDINGS: Mediastinal blood pool activity: SUV max 0.4 Liver activity: SUV max N/A NECK: No significant abnormal hypermetabolic activity in this region. Incidental CT findings: Mild right common carotid atherosclerotic calcification. CHEST: Hypermetabolic right hilar mass includes components in the right upper lobe (max SUV 6.6), right middle lobe (max SUV 4.8), and right lower lobe (maximum SUV 4.6). Hypermetabolic prevascular, right paratracheal, AP window, left hilar, paratracheal subcarinal adenopathy. Index AP window lymph node 1.6 cm in short axis on image 72/4, maximum SUV 4.8. Index right paratracheal lymph node 1.1 cm in short axis also on image 72/4, maximum SUV 4.4. Incidental CT findings: Atherosclerotic calcification of the aortic arch, left anterior descending, right coronary arteries. Upper normal heart size. Emphysema noted. ABDOMEN/PELVIS: Focal activity along the anal region, maximum SUV 0.6, probably physiologic nonspecific. No hypermetabolic activity associated with the low-density left adrenal mass which has density characteristics compatible with adenoma. Incidental CT findings: Aortoiliac atherosclerotic vascular disease. SKELETON: No significant abnormal hypermetabolic activity in this region. Incidental CT findings: none IMPRESSION: 1. Hypermetabolic right hilar mass with the bulk of the lesion in the right upper lobe with abnormal bandlike transfissural hypermetabolic extensions into the right middle lobe and right lower  lobe, compatible with malignancy. Tumor extends up to 15.5 cm in greatest length. There is bilateral hypermetabolic mediastinal and hilar adenopathy. Assuming non-small cell lung cancer, appearance is compatible with T4 N3 M0 disease (stage IIIC). 2. Aortic Atherosclerosis (ICD10-I70.0) and Emphysema (ICD10-J43.9). Coronary atherosclerosis. Electronically Signed   By: Van Clines M.D.   On: 01/03/2019 08:48   DG Chest Port 1 View  Result Date: 01/11/2019 CLINICAL DATA:  Shortness of breath. EXAM: PORTABLE CHEST 1 VIEW COMPARISON:  January 10, 2019 FINDINGS: The heart size and mediastinal contours are within normal limits. No pneumothorax or pleural effusion is noted. Grossly stable mild right midlung subsegmental atelectasis in left basilar subsegmental atelectasis is noted. The visualized skeletal structures are unremarkable. IMPRESSION: No active disease. Electronically Signed   By: Marijo Conception M.D.   On: 01/11/2019 08:13   Portable chest 1 View  Result Date: 01/10/2019 CLINICAL DATA:  Follow-up pneumothorax. EXAM: PORTABLE CHEST 1 VIEW COMPARISON:  Radiographs yesterday. FINDINGS: Decreased size of right apical pneumothorax since yesterday, possible tiny residual noted. Right hilar prominence is seen on prior exam, streaky right perihilar atelectasis. Unchanged heart size and mediastinal contours. No pleural fluid or new abnormality. IMPRESSION: 1. Decreased size of right pneumothorax since yesterday, possible tiny residual. 2. Unchanged right hilar prominence and perihilar atelectasis. Electronically Signed   By: Keith Rake M.D.   On: 01/10/2019 06:25  DG CHEST PORT 1 VIEW  Addendum Date: 01/09/2019   ADDENDUM REPORT: 01/09/2019 16:22 ADDENDUM: There is a small right apical pneumothorax. It is slightly larger when compared to the earlier film from today. IMPRESSION: Small right apical pneumothorax, slightly larger when compared to earlier film from same date. Electronically Signed    By: Marijo Sanes M.D.   On: 01/09/2019 16:22   Result Date: 01/09/2019 CLINICAL DATA:  Status post bronchoscopy. EXAM: PORTABLE CHEST 1 VIEW COMPARISON:  Chest x-ray 12/22/2018. PET-CT 01/03/2019 FINDINGS: The cardiac silhouette, mediastinal and hilar contours are stable. Persistent right lower lobe process. Low lung volumes with vascular crowding and bibasilar atelectasis. No postprocedural pneumothorax or hematoma. IMPRESSION: 1. Persistent right lower lobe process. 2. No postprocedural pneumothorax or hematoma. 3. Low lung volumes with vascular crowding and bibasilar atelectasis. Electronically Signed: By: Marijo Sanes M.D. On: 01/09/2019 15:26   DG CHEST PORT 1 VIEW  Result Date: 01/09/2019 CLINICAL DATA:  Post bronchoscopy with biopsy EXAM: PORTABLE CHEST 1 VIEW COMPARISON:  12/22/2018 FINDINGS: Possible small right apical pneumothorax. Right hilar mass is noted with perihilar atelectasis/consolidation. Normal heart size. IMPRESSION: Suspected small right apical pneumothorax. Follow-up is recommended. Electronically Signed   By: Macy Mis M.D.   On: 01/09/2019 12:53   DG C-ARM BRONCHOSCOPY  Result Date: 01/09/2019 C-ARM BRONCHOSCOPY: Fluoroscopy was utilized by the requesting physician.  No radiographic interpretation.     ASSESSMENT/PLAN:  This is a very pleasant 63 year old Caucasian [name] recently diagnosed with a stage IIIc (T4, N3, M0) non-small cell lung cancer, adenocarcinoma presented with large right hilar mass in addition to bilateral hilar and mediastinal lymphadenopathy diagnosed in December 2020. Molecular studies pending.   His restaging brain MRI was negative for any metastatic disease to the brain.  He is currently undergoing treatment with weekly concurrent chemoradiation with carboplatin for an AUC of 2 and paclitaxel 45 mg/m2. He is scheduled to receive his first cycle today.   The patient was seen with Dr. Julien Nordmann today. Labs were reviewed. Recommend that he  proceed with cycle #1 today as scheduled.   We will see him back for a follow up visit in 2 weeks for evaluation before starting cycle #3.   The patient was advised to call immediately if he has any concerning symptoms in the interval. The patient voices understanding of current disease status and treatment options and is in agreement with the current care plan. All questions were answered. The patient knows to call the clinic with any problems, questions or concerns. We can certainly see the patient much sooner if necessary   No orders of the defined types were placed in this encounter.    Zoraida Havrilla L Carnelius Hammitt, [name] 01/30/19  ADDENDUM: Hematology/Oncology Attending: I had a face-to-face encounter with the patient today.  I recommended his care plan.  This is a very pleasant 63 years old white [name] recently diagnosed with a stage IIIc non-small cell lung cancer, adenocarcinoma with no actionable mutations on the molecular studies. The patient is here today to start the first dose of systemic chemotherapy with radiation.  His radiation is not expected to start until next week. I recommended for the patient to proceed with the first cycle of his chemotherapy today as planned. His MRI of the brain showed no concerning findings for brain metastasis. We will see him back for follow-up visit in 2 weeks for evaluation before the next cycle of his treatment. The patient was advised to call immediately if he has any  concerning symptoms in the interval.  Disclaimer: This note was dictated with voice recognition software. Similar sounding words can inadvertently be transcribed and may be missed upon review. Eilleen Kempf, MD 01/30/19

## 2019-01-30 ENCOUNTER — Ambulatory Visit
Admission: RE | Admit: 2019-01-30 | Discharge: 2019-01-30 | Disposition: A | Discharge status: Home / Self Care | Payer: Managed Care, Other (non HMO) | Source: Ambulatory Visit | Attending: Radiation Oncology | Admitting: Radiation Oncology

## 2019-01-30 ENCOUNTER — Encounter: Payer: Self-pay | Admitting: Physician Assistant

## 2019-01-30 ENCOUNTER — Inpatient Hospital Stay: Payer: Managed Care, Other (non HMO)

## 2019-01-30 ENCOUNTER — Encounter: Payer: Self-pay | Admitting: Internal Medicine

## 2019-01-30 ENCOUNTER — Other Ambulatory Visit: Payer: Self-pay

## 2019-01-30 ENCOUNTER — Inpatient Hospital Stay (HOSPITAL_BASED_OUTPATIENT_CLINIC_OR_DEPARTMENT_OTHER): Payer: Managed Care, Other (non HMO) | Admitting: Physician Assistant

## 2019-01-30 ENCOUNTER — Telehealth: Payer: Self-pay | Admitting: Internal Medicine

## 2019-01-30 ENCOUNTER — Encounter: Payer: Self-pay | Admitting: Radiation Oncology

## 2019-01-30 VITALS — BP 111/74 | HR 65 | Temp 98.0°F | Resp 15 | Ht 66.0 in | Wt 186.5 lb

## 2019-01-30 VITALS — BP 93/74 | HR 59 | Temp 97.9°F | Resp 16

## 2019-01-30 VITALS — BP 105/83 | HR 57 | Temp 98.0°F | Resp 18 | Ht 66.0 in | Wt 185.0 lb

## 2019-01-30 DIAGNOSIS — I2510 Atherosclerotic heart disease of native coronary artery without angina pectoris: Secondary | ICD-10-CM | POA: Insufficient documentation

## 2019-01-30 DIAGNOSIS — C3491 Malignant neoplasm of unspecified part of right bronchus or lung: Secondary | ICD-10-CM

## 2019-01-30 DIAGNOSIS — Z7982 Long term (current) use of aspirin: Secondary | ICD-10-CM | POA: Diagnosis not present

## 2019-01-30 DIAGNOSIS — J439 Emphysema, unspecified: Secondary | ICD-10-CM | POA: Diagnosis not present

## 2019-01-30 DIAGNOSIS — F17210 Nicotine dependence, cigarettes, uncomplicated: Secondary | ICD-10-CM | POA: Insufficient documentation

## 2019-01-30 DIAGNOSIS — Z923 Personal history of irradiation: Secondary | ICD-10-CM | POA: Diagnosis not present

## 2019-01-30 DIAGNOSIS — E875 Hyperkalemia: Secondary | ICD-10-CM | POA: Insufficient documentation

## 2019-01-30 DIAGNOSIS — Z5111 Encounter for antineoplastic chemotherapy: Secondary | ICD-10-CM | POA: Diagnosis not present

## 2019-01-30 DIAGNOSIS — R590 Localized enlarged lymph nodes: Secondary | ICD-10-CM | POA: Insufficient documentation

## 2019-01-30 DIAGNOSIS — Z79899 Other long term (current) drug therapy: Secondary | ICD-10-CM | POA: Insufficient documentation

## 2019-01-30 DIAGNOSIS — R918 Other nonspecific abnormal finding of lung field: Secondary | ICD-10-CM

## 2019-01-30 LAB — CBC WITH DIFFERENTIAL (CANCER CENTER ONLY)
Abs Immature Granulocytes: 0.02 10*3/uL (ref 0.00–0.07)
Basophils Absolute: 0.1 10*3/uL (ref 0.0–0.1)
Basophils Relative: 1 %
Eosinophils Absolute: 0.2 10*3/uL (ref 0.0–0.5)
Eosinophils Relative: 2 %
HCT: 43.4 % (ref 39.0–52.0)
Hemoglobin: 14.6 g/dL (ref 13.0–17.0)
Immature Granulocytes: 0 %
Lymphocytes Relative: 27 %
Lymphs Abs: 2.2 10*3/uL (ref 0.7–4.0)
MCH: 31.1 pg (ref 26.0–34.0)
MCHC: 33.6 g/dL (ref 30.0–36.0)
MCV: 92.5 fL (ref 80.0–100.0)
Monocytes Absolute: 0.7 10*3/uL (ref 0.1–1.0)
Monocytes Relative: 8 %
Neutro Abs: 5.2 10*3/uL (ref 1.7–7.7)
Neutrophils Relative %: 62 %
Platelet Count: 241 10*3/uL (ref 150–400)
RBC: 4.69 MIL/uL (ref 4.22–5.81)
RDW: 12.8 % (ref 11.5–15.5)
WBC Count: 8.3 10*3/uL (ref 4.0–10.5)
nRBC: 0 % (ref 0.0–0.2)

## 2019-01-30 LAB — CMP (CANCER CENTER ONLY)
ALT: 24 U/L (ref 0–44)
AST: 24 U/L (ref 15–41)
Albumin: 3.8 g/dL (ref 3.5–5.0)
Alkaline Phosphatase: 70 U/L (ref 38–126)
Anion gap: 9 (ref 5–15)
BUN: 13 mg/dL (ref 8–23)
CO2: 22 mmol/L (ref 22–32)
Calcium: 8.7 mg/dL — ABNORMAL LOW (ref 8.9–10.3)
Chloride: 105 mmol/L (ref 98–111)
Creatinine: 0.86 mg/dL (ref 0.61–1.24)
GFR, Est AFR Am: >60 mL/min (ref >60)
GFR, Estimated: >60 mL/min (ref >60)
Glucose, Bld: 97 mg/dL (ref 70–99)
Potassium: 4.3 mmol/L (ref 3.5–5.1)
Sodium: 136 mmol/L (ref 135–145)
Total Bilirubin: 0.4 mg/dL (ref 0.3–1.2)
Total Protein: 6.9 g/dL (ref 6.5–8.1)

## 2019-01-30 MED ORDER — FAMOTIDINE IN NACL 20-0.9 MG/50ML-% IV SOLN
INTRAVENOUS | Status: AC
  Filled 2019-01-30: qty 50

## 2019-01-30 MED ORDER — SODIUM CHLORIDE 0.9 % IV SOLN
209.2000 mg | Freq: Once | INTRAVENOUS | Status: AC
  Administered 2019-01-30: 210 mg via INTRAVENOUS
  Filled 2019-01-30: qty 21

## 2019-01-30 MED ORDER — SODIUM CHLORIDE 0.9 % IV SOLN: 10.0000 mg | Freq: Once | INTRAVENOUS | Status: DC

## 2019-01-30 MED ORDER — SODIUM CHLORIDE 0.9 % IV SOLN
45.0000 mg/m2 | Freq: Once | INTRAVENOUS | Status: AC
  Administered 2019-01-30: 90 mg via INTRAVENOUS
  Filled 2019-01-30: qty 15

## 2019-01-30 MED ORDER — SODIUM CHLORIDE 0.9 % IV SOLN
Freq: Once | INTRAVENOUS | Status: AC
  Filled 2019-01-30: qty 250

## 2019-01-30 MED ORDER — FAMOTIDINE IN NACL 20-0.9 MG/50ML-% IV SOLN
20.0000 mg | Freq: Once | INTRAVENOUS | Status: AC
  Administered 2019-01-30: 20 mg via INTRAVENOUS

## 2019-01-30 MED ORDER — DEXAMETHASONE SODIUM PHOSPHATE 10 MG/ML IJ SOLN
INTRAMUSCULAR | Status: AC
  Filled 2019-01-30: qty 1

## 2019-01-30 MED ORDER — PALONOSETRON HCL INJECTION 0.25 MG/5ML
0.2500 mg | Freq: Once | INTRAVENOUS | Status: AC
  Administered 2019-01-30: 13:00:00 0.25 mg via INTRAVENOUS

## 2019-01-30 MED ORDER — DIPHENHYDRAMINE HCL 50 MG/ML IJ SOLN
50.0000 mg | Freq: Once | INTRAMUSCULAR | Status: AC
  Administered 2019-01-30: 50 mg via INTRAVENOUS

## 2019-01-30 MED ORDER — PALONOSETRON HCL INJECTION 0.25 MG/5ML
INTRAVENOUS | Status: AC
  Filled 2019-01-30: qty 5

## 2019-01-30 MED ORDER — DEXAMETHASONE SODIUM PHOSPHATE 10 MG/ML IJ SOLN
10.0000 mg | Freq: Once | INTRAMUSCULAR | Status: AC
  Administered 2019-01-30: 10 mg via INTRAVENOUS

## 2019-01-30 MED ORDER — DIPHENHYDRAMINE HCL 50 MG/ML IJ SOLN
INTRAMUSCULAR | Status: AC
  Filled 2019-01-30: qty 1

## 2019-01-30 NOTE — Progress Notes (Signed)
Cont carbo dose at 210mg  as ordered per Dr Julien Nordmann

## 2019-01-30 NOTE — Progress Notes (Signed)
Met w/ pt to introduce myself as his Arboriculturist.  Unfortunately there aren't any foundations offering copay assistance for his Dx.  I offered the [employer] and went over what it covers but he declined at this time.  I gave him my card in case he changes his mind and for any questions or concerns he may have in the future.

## 2019-01-30 NOTE — Progress Notes (Signed)
Thoracic Location of Tumor / Histology: stage IIIc (T4, N3, M0) non-small cell lung cancer, adenocarcinoma presented with large right hilar mass in addition to bilateral hilar and mediastinal lymphadenopathy  Patient presented with symptoms of: The patient was seen by his primary care physician in early November 2020 and because of his long history for smoking and cough productive of clear foamy mucus CT screening of the chest was performed on December 09, 2018 and it showed a large area of masslike architecture distortion within the perihilar right lung involving both the right upper lobe and right lower lobe.  This has an equivalent diameter of 4.9 cm.  The scan also showed left AP window lymph node measuring 1.6 cm as well as right paratracheal node that is borderline enlarged and measured 1.3 cm.  The patient was seen by Dr. Valeta Harms and he underwent bronchoscopy on December 22, 2018 but it was not conclusive for malignancy.  A PET scan was performed on January 03, 2019 and it showed hypermetabolic right hilar mass with the bulk of the lesion in the right upper lobe with abnormal bandlike transfusion or hypermetabolic extension into the right middle lobe and right lower lobe compatible with malignancy.  The tumor extends up to 15.5 cm in greatest length.  There is bilateral hypermetabolic mediastinal and hilar adenopathy.  The appearance was compatible with T4, N3, M0 disease.  On January 09, 2019 the patient underwent repeat bronchoscopy with endobronchial ultrasound procedure under the care of Dr. Valeta Harms.  The final pathology ([id number]) showed malignant cells consistent with adenocarcinoma.  The material were insufficient for molecular studies.  Biopsies revealed: 01/09/19: CYTOLOGY - NON PAP  CASE: [id number]  PATIENT: Philip [name]  Non-Gynecological Cytology Report   Clinical History: Left Lung Mass  FINAL MICROSCOPIC DIAGNOSIS:   A. LYMPH NODE, 11L node, FINE NEEDLE ASPIRATION:  - Atypical  cells present.  - Lymphoid tissue present.   B. LYMPH NODE, 11R node, FINE NEEDLE ASPIRATION:  - Atypical cells present.  - Lymphoid tissue present.   C. LUNG, RUL BRUSHINGS, FINE NEEDLE ASPIRATION  - Atypical cells present   D. LUNG, RLL BRUSHINGS, FINE NEEDLE ASPIRATION  - Malignant cells consistent with adenocarcinoma   E. LUNG, RUL, BRONCHIAL ALVEOLAR LAVAGE  - No malignant cells identified   F. LUNG, RLL, BRONCHIAL ALVEOLAR LAVAGE  - No malignant cells present    01/09/19: FINAL MICROSCOPIC DIAGNOSIS:   A. LUNG, RIGHT LOWER LOBE, BIOPSY:  - Benign lung tissue.   B. LUNG, RIGHT UPPER LOBE, BIOPSY:  - Focal adenocarcinoma.   Tobacco/Marijuana/Snuff/ETOH use:  Tobacco Use  . Smoking status: Current Every Day Smoker    Packs/day: 1.00    Years: 45.00    Pack years: 45.00    Types: Cigarettes    Start date: 07/04/1970  . Smokeless tobacco: Former Systems developer    Types: Chew  Substance Use Topics  . Alcohol use: Yes    Comment: drinks 1/2 5th every weekend  . Drug use: Not Currently    Types: Marijuana    Comment: none for 20 years     Past/Anticipated interventions by cardiothoracic surgery, if any: None at this time.  Past/Anticipated interventions by medical oncology, if any: Per Dr. Julien Nordmann 01/13/19:  ASSESSMENT: This is a very pleasant 63 years old white male recently diagnosed with a stage IIIc (T4, N3, M0) non-small cell lung cancer, adenocarcinoma presented with large right hilar mass in addition to bilateral hilar and mediastinal lymphadenopathy diagnosed in December 2020.  PLAN: I  had a lengthy discussion with the patient and his wife today about his current disease stage, prognosis and treatment options. I recommended for the patient to complete the staging work-up by ordering MRI of the brain to rule out brain metastasis.  I will also order [employer] CDX blood test for molecular studies because of the insufficient material from the biopsy. I  personally and independently reviewed his the scan images and discussed the result and showed the images to the patient and his wife. I recommended for the patient a course of concurrent chemoradiation with weekly carboplatin for AUC of 2 and paclitaxel 45 mg/M2.  This will be followed by consolidation treatment with immunotherapy if the patient has no evidence for disease progression after the concurrent chemoradiation. I discussed with the patient the adverse effect of this treatment including but not limited to alopecia, myelosuppression, nausea and vomiting, peripheral neuropathy, liver or renal dysfunction. He is expected to start the first cycle of this treatment on January 30, 2019. I will refer the patient to radiation oncology for evaluation and discussion of the radiotherapy option. The patient and his wife agreed to the current plan. He will have a chemotherapy education class before the first dose of his treatment. For smoking cessation, I strongly encouraged the patient to quit smoking. He will come back for follow-up visit on January 30, 2019 with the start of the first cycle of his treatment. The patient was advised to call immediately if he has any concerning symptoms in the interval. Signs/Symptoms he continues to have cough as well as shortness of breath with exertion but no significant chest pain or hemoptysis.  He denied having any nausea, vomiting, diarrhea or constipation.  He denied having any headache or visual changes.  Hemoptysis, if any: pt reports occasional cough with clear foamy phlegm but denies hemoptysis.  Pain issues, if any:  Pt reports generalized arthritic pain rated 3/10  SAFETY ISSUES:  Prior radiation? No  Pacemaker/ICD? No   Possible current pregnancy? N/A  Is the patient on methotrexate? No  Current Complaints / other details:  Philip [name] presents today for initial consult with Dr. Sondra Come for Radiation Oncology. Pt is anxious.  BP 105/83 (BP  Location: Left Arm, Patient Position: Sitting)   Pulse (!) 57   Temp 98 F (36.7 C) (Temporal)   Resp 18   Ht 5\' 6"  (1.676 m)   Wt 185 lb (83.9 kg)   SpO2 97%   BMI 29.86 kg/m   Wt Readings from Last 3 Encounters:  01/30/19 185 lb (83.9 kg)  01/13/19 178 lb 14.4 oz (81.1 kg)  01/09/19 179 lb 14.3 oz (81.6 kg)   Loma Sousa, RN BSN

## 2019-01-30 NOTE — Patient Instructions (Addendum)
[facility] Discharge Instructions for Patients Receiving Chemotherapy  Today you received the following chemotherapy agents :  Paclitaxel, Carboplatin.  To help prevent nausea and vomiting after your treatment, we encourage you to take your nausea medication as prescribed.  TAKE COMPAZINE 10 MG EVERY 6 HOURS AS NEEDED FOR NAUSEA.  DO NOT DRIVE AS THIS MEDICATION CAN CAUSE DROWSINESS. DO NOT  EAT  GREASY  NOR  SPICY  FOODS.  DO  DRINK  LOTS  OF  FLUIDS  AS  TOLERATED.   If you develop nausea and vomiting that is not controlled by your nausea medication, call the clinic.   BELOW ARE SYMPTOMS THAT SHOULD BE REPORTED IMMEDIATELY:  *FEVER GREATER THAN 100.5 F  *CHILLS WITH OR WITHOUT FEVER  NAUSEA AND VOMITING THAT IS NOT CONTROLLED WITH YOUR NAUSEA MEDICATION  *UNUSUAL SHORTNESS OF BREATH  *UNUSUAL BRUISING OR BLEEDING  TENDERNESS IN MOUTH AND THROAT WITH OR WITHOUT PRESENCE OF ULCERS  *URINARY PROBLEMS  *BOWEL PROBLEMS  UNUSUAL RASH Items with * indicate a potential emergency and should be followed up as soon as possible.  Feel free to call the clinic should you have any questions or concerns. The clinic phone number is [phone].  Please show the [facility] at check-in to the Emergency Department and triage nurse.   Paclitaxel injection What is this medicine? PACLITAXEL (PAK li TAX el) is a chemotherapy drug. It targets fast dividing cells, like cancer cells, and causes these cells to die. This medicine is used to treat ovarian cancer, breast cancer, lung cancer, Kaposi's sarcoma, and other cancers. This medicine may be used for other purposes; ask your health care provider or pharmacist if you have questions. COMMON BRAND NAME(S): Onxol, Taxol What should I tell my health care provider before I take this medicine? They need to know if you have any of these conditions:  history of irregular heartbeat  liver disease  low blood counts, like low  white cell, platelet, or red cell counts  lung or breathing disease, like asthma  tingling of the fingers or toes, or other nerve disorder  an unusual or allergic reaction to paclitaxel, alcohol, polyoxyethylated castor oil, other chemotherapy, other medicines, foods, dyes, or preservatives  pregnant or trying to get pregnant  breast-feeding How should I use this medicine? This drug is given as an infusion into a vein. It is administered in a hospital or clinic by a specially trained health care professional. Talk to your pediatrician regarding the use of this medicine in children. Special care may be needed. Overdosage: If you think you have taken too much of this medicine contact a poison control center or emergency room at once. NOTE: This medicine is only for you. Do not share this medicine with others. What if I miss a dose? It is important not to miss your dose. Call your doctor or health care professional if you are unable to keep an appointment. What may interact with this medicine? Do not take this medicine with any of the following medications:  disulfiram  metronidazole This medicine may also interact with the following medications:  antiviral medicines for hepatitis, HIV or AIDS  certain antibiotics like erythromycin and clarithromycin  certain medicines for fungal infections like ketoconazole and itraconazole  certain medicines for seizures like carbamazepine, phenobarbital, phenytoin  gemfibrozil  nefazodone  rifampin  St. John's wort This list may not describe all possible interactions. Give your health care provider a list of all the medicines, herbs, non-prescription  drugs, or dietary supplements you use. Also tell them if you smoke, drink alcohol, or use illegal drugs. Some items may interact with your medicine. What should I watch for while using this medicine? Your condition will be monitored carefully while you are receiving this medicine. You will need  important blood work done while you are taking this medicine. This medicine can cause serious allergic reactions. To reduce your risk you will need to take other medicine(s) before treatment with this medicine. If you experience allergic reactions like skin rash, itching or hives, swelling of the face, lips, or tongue, tell your doctor or health care professional right away. In some cases, you may be given additional medicines to help with side effects. Follow all directions for their use. This drug may make you feel generally unwell. This is not uncommon, as chemotherapy can affect healthy cells as well as cancer cells. Report any side effects. Continue your course of treatment even though you feel ill unless your doctor tells you to stop. Call your doctor or health care professional for advice if you get a fever, chills or sore throat, or other symptoms of a cold or flu. Do not treat yourself. This drug decreases your body's ability to fight infections. Try to avoid being around people who are sick. This medicine may increase your risk to bruise or bleed. Call your doctor or health care professional if you notice any unusual bleeding. Be careful brushing and flossing your teeth or using a toothpick because you may get an infection or bleed more easily. If you have any dental work done, tell your dentist you are receiving this medicine. Avoid taking products that contain aspirin, acetaminophen, ibuprofen, naproxen, or ketoprofen unless instructed by your doctor. These medicines may hide a fever. Do not become pregnant while taking this medicine. Women should inform their doctor if they wish to become pregnant or think they might be pregnant. There is a potential for serious side effects to an unborn child. Talk to your health care professional or pharmacist for more information. Do not breast-feed an infant while taking this medicine. Men are advised not to father a child while receiving this  medicine. This product may contain alcohol. Ask your pharmacist or healthcare provider if this medicine contains alcohol. Be sure to tell all healthcare providers you are taking this medicine. Certain medicines, like metronidazole and disulfiram, can cause an unpleasant reaction when taken with alcohol. The reaction includes flushing, headache, nausea, vomiting, sweating, and increased thirst. The reaction can last from 30 minutes to several hours. What side effects may I notice from receiving this medicine? Side effects that you should report to your doctor or health care professional as soon as possible:  allergic reactions like skin rash, itching or hives, swelling of the face, lips, or tongue  breathing problems  changes in vision  fast, irregular heartbeat  high or low blood pressure  mouth sores  pain, tingling, numbness in the hands or feet  signs of decreased platelets or bleeding - bruising, pinpoint red spots on the skin, black, tarry stools, blood in the urine  signs of decreased red blood cells - unusually weak or tired, feeling faint or lightheaded, falls  signs of infection - fever or chills, cough, sore throat, pain or difficulty passing urine  signs and symptoms of liver injury like dark yellow or brown urine; general ill feeling or flu-like symptoms; light-colored stools; loss of appetite; nausea; right upper belly pain; unusually weak or tired; yellowing of the  eyes or skin  swelling of the ankles, feet, hands  unusually slow heartbeat Side effects that usually do not require medical attention (report to your doctor or health care professional if they continue or are bothersome):  diarrhea  hair loss  loss of appetite  muscle or joint pain  nausea, vomiting  pain, redness, or irritation at site where injected  tiredness This list may not describe all possible side effects. Call your doctor for medical advice about side effects. You may report side  effects to FDA at 1-800-FDA-1088. Where should I keep my medicine? This drug is given in a hospital or clinic and will not be stored at home. NOTE: This sheet is a summary. It may not cover all possible information. If you have questions about this medicine, talk to your doctor, pharmacist, or health care provider.  2020 [employer] Standard (2016-09-22 13:14:55)   Carboplatin injection What is this medicine? CARBOPLATIN (KAR boe pla tin) is a chemotherapy drug. It targets fast dividing cells, like cancer cells, and causes these cells to die. This medicine is used to treat ovarian cancer and many other cancers. This medicine may be used for other purposes; ask your health care provider or pharmacist if you have questions. COMMON BRAND NAME(S): Paraplatin What should I tell my health care provider before I take this medicine? They need to know if you have any of these conditions:  blood disorders  hearing problems  kidney disease  recent or ongoing radiation therapy  an unusual or allergic reaction to carboplatin, cisplatin, other chemotherapy, other medicines, foods, dyes, or preservatives  pregnant or trying to get pregnant  breast-feeding How should I use this medicine? This drug is usually given as an infusion into a vein. It is administered in a hospital or clinic by a specially trained health care professional. Talk to your pediatrician regarding the use of this medicine in children. Special care may be needed. Overdosage: If you think you have taken too much of this medicine contact a poison control center or emergency room at once. NOTE: This medicine is only for you. Do not share this medicine with others. What if I miss a dose? It is important not to miss a dose. Call your doctor or health care professional if you are unable to keep an appointment. What may interact with this medicine?  medicines for seizures  medicines to increase blood counts like filgrastim,  pegfilgrastim, sargramostim  some antibiotics like amikacin, gentamicin, neomycin, streptomycin, tobramycin  vaccines Talk to your doctor or health care professional before taking any of these medicines:  acetaminophen  aspirin  ibuprofen  ketoprofen  naproxen This list may not describe all possible interactions. Give your health care provider a list of all the medicines, herbs, non-prescription drugs, or dietary supplements you use. Also tell them if you smoke, drink alcohol, or use illegal drugs. Some items may interact with your medicine. What should I watch for while using this medicine? Your condition will be monitored carefully while you are receiving this medicine. You will need important blood work done while you are taking this medicine. This drug may make you feel generally unwell. This is not uncommon, as chemotherapy can affect healthy cells as well as cancer cells. Report any side effects. Continue your course of treatment even though you feel ill unless your doctor tells you to stop. In some cases, you may be given additional medicines to help with side effects. Follow all directions for their use. Call your doctor or health  care professional for advice if you get a fever, chills or sore throat, or other symptoms of a cold or flu. Do not treat yourself. This drug decreases your body's ability to fight infections. Try to avoid being around people who are sick. This medicine may increase your risk to bruise or bleed. Call your doctor or health care professional if you notice any unusual bleeding. Be careful brushing and flossing your teeth or using a toothpick because you may get an infection or bleed more easily. If you have any dental work done, tell your dentist you are receiving this medicine. Avoid taking products that contain aspirin, acetaminophen, ibuprofen, naproxen, or ketoprofen unless instructed by your doctor. These medicines may hide a fever. Do not become pregnant  while taking this medicine. Women should inform their doctor if they wish to become pregnant or think they might be pregnant. There is a potential for serious side effects to an unborn child. Talk to your health care professional or pharmacist for more information. Do not breast-feed an infant while taking this medicine. What side effects may I notice from receiving this medicine? Side effects that you should report to your doctor or health care professional as soon as possible:  allergic reactions like skin rash, itching or hives, swelling of the face, lips, or tongue  signs of infection - fever or chills, cough, sore throat, pain or difficulty passing urine  signs of decreased platelets or bleeding - bruising, pinpoint red spots on the skin, black, tarry stools, nosebleeds  signs of decreased red blood cells - unusually weak or tired, fainting spells, lightheadedness  breathing problems  changes in hearing  changes in vision  chest pain  high blood pressure  low blood counts - This drug may decrease the number of white blood cells, red blood cells and platelets. You may be at increased risk for infections and bleeding.  nausea and vomiting  pain, swelling, redness or irritation at the injection site  pain, tingling, numbness in the hands or feet  problems with balance, talking, walking  trouble passing urine or change in the amount of urine Side effects that usually do not require medical attention (report to your doctor or health care professional if they continue or are bothersome):  hair loss  loss of appetite  metallic taste in the mouth or changes in taste This list may not describe all possible side effects. Call your doctor for medical advice about side effects. You may report side effects to FDA at 1-800-FDA-1088. Where should I keep my medicine? This drug is given in a hospital or clinic and will not be stored at home. NOTE: This sheet is a summary. It may not  cover all possible information. If you have questions about this medicine, talk to your doctor, pharmacist, or health care provider.  2020 [employer]/Gold Standard (2007-04-26 14:38:05)

## 2019-01-30 NOTE — Telephone Encounter (Signed)
Scheduled per los. Gave printout

## 2019-01-30 NOTE — Progress Notes (Signed)
Radiation Oncology         [phone] ________________________________  Initial Outpatient Consultation  Name: Philip [name] MRN: [id number]  Date: 01/30/2019  DOB: 09-18-1955  CC:[name], MD  Heilingoetter, Cassandr*   REFERRING PHYSICIAN: Heilingoetter, Cassandr*  DIAGNOSIS: The encounter diagnosis was Adenocarcinoma of right lung, stage 3 ([place]).  Stage IIIC (T4,N3, M0)non-small cell lung cancer, adenocarcinoma  HISTORY OF PRESENT ILLNESS::Philip [name] is a 63 y.o. male who is accompanied by no one due to COVID-19 restrictions. The patient presented to [facility], NP, on 12/09/2018 for lung cancer screening. CT of chest on 12/09/2018 showed suspicious Lung-RADS 4B and enlarged left-sided AP window lymph node measuring 1.6 cm.  Bronchoscopy was performed by Dr. Valeta Harms on 12/22/2018. Cytology from procedure showed no malignant cells in the right hilar mass lymph node, right upper lobe lymph node, or right bronchial lung. Surigcal pathology from procedure showed benign bronchial mucosa. No granulomas or malignancy. No monoclonal B-cell or phenotypically aberrant T-cell population.  PET scan on 01/03/2019 showed hypermetabolic right hilar mass with the bulk of the lesion in the right upper lobe with abnormal bandlike transfissural hypermetabolic extensions into the right middle lobe and right lower lobe, compatible with malignancy. Tumor extends up to 15.5 cm in greatest length. It also showed bilateral hypermetabolic mediastinal and hilar adenopathy. Assuming non-small cell lung cancer, appearance was compatible with T4 N3 M0 disease (stage IIIC).  Repeat bronchoscopy was performed on 01/09/2019. Cytology from procedure showed atypical cells in the 11L lymph node, 11R lymph node, and right upper lobe. Malignant cells consistent with adenocarcinoma were found in the right lower lobe. Surgical pathology from procedure benign lung tissue in right lower lobe and focal  adenocarcinoma in the right upper lobe.  MRI of brain on 01/23/2019 showed no evidence of metastatic disease.  Beginning today, patient will be on weekly concurrent chemoradiation with carboplatin for an AUC of 2 and paclitaxel 45 mg/m2 under the care of Dr. Julien Nordmann.  PREVIOUS RADIATION THERAPY: No  PAST MEDICAL HISTORY:  Past Medical History:  Diagnosis Date  . Hyperlipidemia   . Pneumonia   . Tobacco abuse     PAST SURGICAL HISTORY: Past Surgical History:  Procedure Laterality Date  . COLONOSCOPY    . HAND SURGERY    . VIDEO BRONCHOSCOPY WITH ENDOBRONCHIAL ULTRASOUND N/A 12/22/2018   Procedure: VIDEO BRONCHOSCOPY WITH ENDOBRONCHIAL ULTRASOUND;  Surgeon: Garner Nash, DO;  Location: [facility];  Service: Thoracic;  Laterality: N/A;  . VIDEO BRONCHOSCOPY WITH ENDOBRONCHIAL ULTRASOUND N/A 01/09/2019   Procedure: VIDEO BRONCHOSCOPY WITH ENDOBRONCHIAL ULTRASOUND WITH FLUORO;  Surgeon: Garner Nash, DO;  Location: [facility];  Service: Thoracic;  Laterality: N/A;  . VIDEO BRONCHOSCOPY WITH RADIAL ENDOBRONCHIAL ULTRASOUND N/A 12/22/2018   Procedure: RADIAL ENDOBRONCHIAL ULTRASOUND;  Surgeon: Garner Nash, DO;  Location: [facility];  Service: Thoracic;  Laterality: N/A;    FAMILY HISTORY:  Family History  Problem Relation Age of Onset  . Cancer Father   . Stomach cancer Father     SOCIAL HISTORY:  Social History   Tobacco Use  . Smoking status: Current Every Day Smoker    Packs/day: 1.00    Years: 45.00    Pack years: 45.00    Types: Cigarettes    Start date: 07/04/1970  . Smokeless tobacco: Former Systems developer    Types: Chew  Substance Use Topics  . Alcohol use: Yes    Comment: drinks 1/2 5th every weekend  . Drug use: Not Currently  Types: Marijuana    Comment: none for 20 years    ALLERGIES:  Allergies  Allergen Reactions  . Norco [Hydrocodone-Acetaminophen] Other (See Comments)    Made pt feel jittery     MEDICATIONS:  Current Outpatient Medications  Medication Sig  Dispense Refill  . aspirin 325 MG tablet Take 650 mg by mouth daily as needed for moderate pain.    . Aspirin-Acetaminophen-Caffeine (GOODY HEADACHE PO) Take 1 packet by mouth daily as needed (headaches).    Marland Kitchen atorvastatin (LIPITOR) 20 MG tablet Take 20 mg by mouth daily.    Marland Kitchen bismuth subsalicylate (PEPTO BISMOL) 262 MG/15ML suspension Take 30 mLs by mouth every 6 (six) hours as needed for indigestion.    . prochlorperazine (COMPAZINE) 10 MG tablet Take 1 tablet (10 mg total) by mouth every 6 (six) hours as needed for nausea or vomiting. 30 tablet 2  . sildenafil (REVATIO) 20 MG tablet Take 20-60 mg by mouth daily as needed (ED).     . Tetrahydrozoline HCl (VISINE OP) Place 1 drop into both eyes daily as needed (redness).    Marland Kitchen albuterol (VENTOLIN HFA) 108 (90 Base) MCG/ACT inhaler Inhale 2 puffs into the lungs every 6 (six) hours as needed for wheezing or shortness of breath. 1 g 0   No current facility-administered medications for this encounter.    REVIEW OF SYSTEMS:  A 10+ POINT REVIEW OF SYSTEMS WAS OBTAINED including neurology, dermatology, psychiatry, cardiac, respiratory, lymph, extremities, GI, GU, musculoskeletal, constitutional, reproductive, HEENT.  He continues to have a lot of stamina ambulate 8 flights of steps with tools in hand.  He is decreased his smoking intake to approximately 1/2 pack of cigarettes per day.  Does not wish to consider medication to help him stop at this time.  Previously ran marathons at a younger age   PHYSICAL EXAM:  height is 5\' 6"  (1.676 m) and weight is 185 lb (83.9 kg). His temporal temperature is 98 F (36.7 C). His blood pressure is 105/83 and his pulse is 57 (abnormal). His respiration is 18 and oxygen saturation is 97%.   General: Alert and oriented, in no acute distress HEENT: Head is normocephalic. Extraocular movements are intact. Oropharynx is clear. Neck: Neck is supple, no palpable cervical or supraclavicular lymphadenopathy. Heart: Regular in  rate and rhythm with no murmurs, rubs, or gallops. Chest: Clear to auscultation bilaterally, with no rhonchi, wheezes, or rales. Abdomen: Soft, nontender, nondistended, with no rigidity or guarding. Extremities: No cyanosis or edema. Lymphatics: see Neck Exam Skin: No concerning lesions. Musculoskeletal: symmetric strength and muscle tone throughout. Neurologic: Cranial nerves II through XII are grossly intact. No obvious focalities. Speech is fluent. Coordination is intact. Psychiatric: Judgment and insight are intact. Affect is appropriate.   ECOG = 1  0 - Asymptomatic (Fully active, able to carry on all predisease activities without restriction)  1 - Symptomatic but completely ambulatory (Restricted in physically strenuous activity but ambulatory and able to carry out work of a light or sedentary nature. For example, light housework, office work)  2 - Symptomatic, <50% in bed during the day (Ambulatory and capable of all self care but unable to carry out any work activities. Up and about more than 50% of waking hours)  3 - Symptomatic, >50% in bed, but not bedbound (Capable of only limited self-care, confined to bed or chair 50% or more of waking hours)  4 - Bedbound (Completely disabled. Cannot carry on any self-care. Totally confined to bed or chair)  5 -  Death   Eustace Pen MM, Creech RH, Tormey DC, et al. [phone]). "Toxicity and response criteria of the [facility]". [place]. 5 (6): 649-55  LABORATORY DATA:  Lab Results  Component Value Date   WBC 8.3 01/30/2019   HGB 14.6 01/30/2019   HCT 43.4 01/30/2019   MCV 92.5 01/30/2019   PLT 241 01/30/2019   NEUTROABS 5.2 01/30/2019   Lab Results  Component Value Date   NA 136 01/30/2019   K 4.3 01/30/2019   CL 105 01/30/2019   CO2 22 01/30/2019   GLUCOSE 97 01/30/2019   CREATININE 0.86 01/30/2019   CALCIUM 8.7 (L) 01/30/2019      RADIOGRAPHY: MR Brain W Wo Contrast  Result Date:  01/23/2019 CLINICAL DATA:  Lung cancer, initial staging EXAM: MRI HEAD WITHOUT AND WITH CONTRAST TECHNIQUE: Multiplanar, multiecho pulse sequences of the brain and surrounding structures were obtained without and with intravenous contrast. CONTRAST:  22mL GADAVIST GADOBUTROL 1 MMOL/ML IV SOLN COMPARISON:  2010, no recent imaging available FINDINGS: Brain: There is no acute infarction or intracranial hemorrhage. There is no intracranial mass, mass effect, or edema. There is no hydrocephalus or extra-axial fluid collection. Minimal patchy foci of T2 hyperintensity in the supratentorial white matter are nonspecific and may reflect minor chronic microvascular ischemic changes. No abnormal enhancement. Vascular: Major vessel flow voids at the skull base are preserved. Skull and upper cervical spine: Normal marrow signal is preserved. Sinuses/Orbits: Mild mucosal thickening.  Orbits are unremarkable. Other: Sella is unremarkable.  Mastoid air cells are clear. IMPRESSION: No evidence of metastatic disease. Electronically Signed   By: Macy Mis M.D.   On: 01/23/2019 20:42   NM PET - Initial Skull Base To Thigh  Result Date: 01/03/2019 CLINICAL DATA:  Initial treatment strategy for non-small cell right lung cancer. EXAM: NUCLEAR MEDICINE PET SKULL BASE TO THIGH TECHNIQUE: 8.9 mCi F-18 FDG was injected intravenously. Full-ring PET imaging was performed from the skull base to thigh after the radiotracer. CT data was obtained and used for attenuation correction and anatomic localization. Fasting blood glucose: 113 mg/dl COMPARISON:  Chest CT 12/09/2018 FINDINGS: Mediastinal blood pool activity: SUV max 0.4 Liver activity: SUV max N/A NECK: No significant abnormal hypermetabolic activity in this region. Incidental CT findings: Mild right common carotid atherosclerotic calcification. CHEST: Hypermetabolic right hilar mass includes components in the right upper lobe (max SUV 6.6), right middle lobe (max SUV 4.8), and  right lower lobe (maximum SUV 4.6). Hypermetabolic prevascular, right paratracheal, AP window, left hilar, paratracheal subcarinal adenopathy. Index AP window lymph node 1.6 cm in short axis on image 72/4, maximum SUV 4.8. Index right paratracheal lymph node 1.1 cm in short axis also on image 72/4, maximum SUV 4.4. Incidental CT findings: Atherosclerotic calcification of the aortic arch, left anterior descending, right coronary arteries. Upper normal heart size. Emphysema noted. ABDOMEN/PELVIS: Focal activity along the anal region, maximum SUV 0.6, probably physiologic nonspecific. No hypermetabolic activity associated with the low-density left adrenal mass which has density characteristics compatible with adenoma. Incidental CT findings: Aortoiliac atherosclerotic vascular disease. SKELETON: No significant abnormal hypermetabolic activity in this region. Incidental CT findings: none IMPRESSION: 1. Hypermetabolic right hilar mass with the bulk of the lesion in the right upper lobe with abnormal bandlike transfissural hypermetabolic extensions into the right middle lobe and right lower lobe, compatible with malignancy. Tumor extends up to 15.5 cm in greatest length. There is bilateral hypermetabolic mediastinal and hilar adenopathy. Assuming non-small cell lung cancer, appearance is compatible  with T4 N3 M0 disease (stage IIIC). 2. Aortic Atherosclerosis (ICD10-I70.0) and Emphysema (ICD10-J43.9). Coronary atherosclerosis. Electronically Signed   By: Van Clines M.D.   On: 01/03/2019 08:48   DG Chest Port 1 View  Result Date: 01/11/2019 CLINICAL DATA:  Shortness of breath. EXAM: PORTABLE CHEST 1 VIEW COMPARISON:  January 10, 2019 FINDINGS: The heart size and mediastinal contours are within normal limits. No pneumothorax or pleural effusion is noted. Grossly stable mild right midlung subsegmental atelectasis in left basilar subsegmental atelectasis is noted. The visualized skeletal structures are  unremarkable. IMPRESSION: No active disease. Electronically Signed   By: Marijo Conception M.D.   On: 01/11/2019 08:13   Portable chest 1 View  Result Date: 01/10/2019 CLINICAL DATA:  Follow-up pneumothorax. EXAM: PORTABLE CHEST 1 VIEW COMPARISON:  Radiographs yesterday. FINDINGS: Decreased size of right apical pneumothorax since yesterday, possible tiny residual noted. Right hilar prominence is seen on prior exam, streaky right perihilar atelectasis. Unchanged heart size and mediastinal contours. No pleural fluid or new abnormality. IMPRESSION: 1. Decreased size of right pneumothorax since yesterday, possible tiny residual. 2. Unchanged right hilar prominence and perihilar atelectasis. Electronically Signed   By: Keith Rake M.D.   On: 01/10/2019 06:25   DG CHEST PORT 1 VIEW  Addendum Date: 01/09/2019   ADDENDUM REPORT: 01/09/2019 16:22 ADDENDUM: There is a small right apical pneumothorax. It is slightly larger when compared to the earlier film from today. IMPRESSION: Small right apical pneumothorax, slightly larger when compared to earlier film from same date. Electronically Signed   By: Marijo Sanes M.D.   On: 01/09/2019 16:22   Result Date: 01/09/2019 CLINICAL DATA:  Status post bronchoscopy. EXAM: PORTABLE CHEST 1 VIEW COMPARISON:  Chest x-ray 12/22/2018. PET-CT 01/03/2019 FINDINGS: The cardiac silhouette, mediastinal and hilar contours are stable. Persistent right lower lobe process. Low lung volumes with vascular crowding and bibasilar atelectasis. No postprocedural pneumothorax or hematoma. IMPRESSION: 1. Persistent right lower lobe process. 2. No postprocedural pneumothorax or hematoma. 3. Low lung volumes with vascular crowding and bibasilar atelectasis. Electronically Signed: By: Marijo Sanes M.D. On: 01/09/2019 15:26   DG CHEST PORT 1 VIEW  Result Date: 01/09/2019 CLINICAL DATA:  Post bronchoscopy with biopsy EXAM: PORTABLE CHEST 1 VIEW COMPARISON:  12/22/2018 FINDINGS: Possible small  right apical pneumothorax. Right hilar mass is noted with perihilar atelectasis/consolidation. Normal heart size. IMPRESSION: Suspected small right apical pneumothorax. Follow-up is recommended. Electronically Signed   By: Macy Mis M.D.   On: 01/09/2019 12:53   DG C-ARM BRONCHOSCOPY  Result Date: 01/09/2019 C-ARM BRONCHOSCOPY: Fluoroscopy was utilized by the requesting physician.  No radiographic interpretation.      IMPRESSION: Stage IIIC (T4,N3, M0)non-small cell lung cancer, adenocarcinoma  Patient will be a good candidate for definitive course of radiation therapy along with radiosensitizing chemotherapy.  Patient will begin his radiosensitizing chemotherapy today.  Today, I talked to the patient  about the findings and work-up thus far.  We discussed the natural history of lung cancer and general treatment, highlighting the role of radiotherapy in the management.  We discussed the available radiation techniques, and focused on the details of logistics and delivery.  We reviewed the anticipated acute and late sequelae associated with radiation in this setting.  The patient was encouraged to ask questions that I answered to the best of my ability.  A patient consent form was discussed and signed.  We retained a copy for our records.  The patient would like to proceed with radiation and will  be scheduled for CT simulation.  PLAN: Patient will return tomorrow morning for CT simulation with treatments to begin next week.  Anticipate 6 weeks of radiation therapy.     ------------------------------------------------  Blair Promise, PhD, MD  This document serves as a record of services personally performed by Gery Pray, MD. It was created on his behalf by Clerance Lav, a trained medical scribe. The creation of this record is based on the scribe's personal observations and the provider's statements to them. This document has been checked and approved by the attending provider.

## 2019-01-30 NOTE — Patient Instructions (Signed)
Coronavirus (COVID-19) Are you at risk?  Are you at risk for the Coronavirus (COVID-19)?  To be considered HIGH RISK for Coronavirus (COVID-19), you have to meet the following criteria:  . Traveled to [place], [place], [place], [place] or [place]; or in the [place] to [place], [place], [place], or [place]; and have fever, cough, and shortness of breath within the last 2 weeks of travel OR . Been in close contact with a person diagnosed with COVID-19 within the last 2 weeks and have fever, cough, and shortness of breath . IF YOU DO NOT MEET THESE CRITERIA, YOU ARE CONSIDERED LOW RISK FOR COVID-19.  What to do if you are HIGH RISK for COVID-19?  . If you are having a medical emergency, call 911. . Seek medical care right away. Before you go to a doctor's office, urgent care or emergency department, call ahead and tell them about your recent travel, contact with someone diagnosed with COVID-19, and your symptoms. You should receive instructions from your physician's office regarding next steps of care.  . When you arrive at healthcare provider, tell the healthcare staff immediately you have returned from visiting [place], [place], [place], [place] or [place]; or traveled in the [place] to [place], [place], [place], or [place]; in the last two weeks or you have been in close contact with a person diagnosed with COVID-19 in the last 2 weeks.   . Tell the health care staff about your symptoms: fever, cough and shortness of breath. . After you have been seen by a medical provider, you will be either: o Tested for (COVID-19) and discharged home on quarantine except to seek medical care if symptoms worsen, and asked to  - Stay home and avoid contact with others until you get your results (4-5 days)  - Avoid travel on public transportation if possible (such as bus, train, or airplane) or o Sent to the Emergency Department by EMS for evaluation, COVID-19 testing, and possible  admission depending on your condition and test results.  What to do if you are LOW RISK for COVID-19?  Reduce your risk of any infection by using the same precautions used for avoiding the common cold or flu:  . Wash your hands often with soap and warm water for at least 20 seconds.  If soap and water are not readily available, use an alcohol-based hand sanitizer with at least 60% alcohol.  . If coughing or sneezing, cover your mouth and nose by coughing or sneezing into the elbow areas of your shirt or coat, into a tissue or into your sleeve (not your hands). . Avoid shaking hands with others and consider head nods or verbal greetings only. . Avoid touching your eyes, nose, or mouth with unwashed hands.  . Avoid close contact with people who are sick. . Avoid places or events with large numbers of people in one location, like concerts or sporting events. . Carefully consider travel plans you have or are making. . If you are planning any travel outside or inside the [place], visit the CDC's Travelers' Health webpage for the latest health notices. . If you have some symptoms but not all symptoms, continue to monitor at home and seek medical attention if your symptoms worsen. . If you are having a medical emergency, call 911.   ADDITIONAL HEALTHCARE OPTIONS FOR PATIENTS  [facility] / e-Visit: [email or web address]         [facility]: [phone]  [facility]: [phone]                   [facility]: [phone]   

## 2019-01-31 ENCOUNTER — Ambulatory Visit
Admission: RE | Admit: 2019-01-31 | Discharge: 2019-01-31 | Disposition: A | Discharge status: Home / Self Care | Payer: Managed Care, Other (non HMO) | Source: Ambulatory Visit | Attending: Radiation Oncology | Admitting: Radiation Oncology

## 2019-01-31 ENCOUNTER — Other Ambulatory Visit: Payer: Self-pay

## 2019-01-31 ENCOUNTER — Telehealth: Payer: Self-pay | Admitting: *Deleted

## 2019-01-31 DIAGNOSIS — Z510 Encounter for antineoplastic radiation therapy: Secondary | ICD-10-CM | POA: Insufficient documentation

## 2019-01-31 DIAGNOSIS — C3491 Malignant neoplasm of unspecified part of right bronchus or lung: Secondary | ICD-10-CM | POA: Diagnosis not present

## 2019-01-31 NOTE — Progress Notes (Signed)
  Radiation Oncology         [phone] ________________________________  Name: Philip [name] MRN: [id number]  Date: 01/31/2019  DOB: 1955-10-28  SIMULATION AND TREATMENT PLANNING NOTE    ICD-10-CM   1. Adenocarcinoma of right lung, stage 3 (HCC)  C34.91     DIAGNOSIS:  Stage IIIC (T4,N3, M0)non-small cell lung cancer, adenocarcinoma  NARRATIVE:  The patient was brought to the [facility].  Identity was confirmed.  All relevant records and images related to the planned course of therapy were reviewed.  The patient freely provided informed written consent to proceed with treatment after reviewing the details related to the planned course of therapy. The consent form was witnessed and verified by the simulation staff.  Then, the patient was set-up in a stable reproducible  supine position for radiation therapy.  CT images were obtained.  Surface markings were placed.  The CT images were loaded into the planning software.  Then the target and avoidance structures were contoured.  Treatment planning then occurred.  The radiation prescription was entered and confirmed.  Then, I designed and supervised the construction of a total of 4 medically necessary complex treatment devices.  I have requested : Intensity Modulated Radiotherapy (IMRT) is medically necessary for this case for the following reason: Disease extension to both hilar areas as well as bilateral mediastinal areas involvement of 3  lobes of the right lung.  I have ordered:dose calc.  PLAN:  The patient will receive 60 Gy in 30 fractions along with radiosensitizing chemotherapy.  Special Treatment Procedure Note: The patient will be receiving radiosensitizing chemotherapy. Given the potential of increased toxicities related to combined therapy and the necessity for close monitoring of the patient and blood work, this constitutes a special treatment procedure.  -----------------------------------  Blair Promise,  PhD, MD

## 2019-02-01 ENCOUNTER — Encounter: Payer: Self-pay | Admitting: *Deleted

## 2019-02-01 NOTE — Progress Notes (Signed)
Oncology Nurse Navigator Documentation  Oncology Nurse Navigator Flowsheets 02/01/2019  Abnormal Finding Date -  Confirmed Diagnosis Date -  Diagnosis Status -  Planned Course of Treatment Chemo/Radiation Concurrent  Phase of Treatment Chemo/Radiation Concurrent  Chemo/Radiation Concurrent Actual Start Date: 02/06/2019  Chemo/Radiation Concurrent Expected End Date: 03/17/2019  Navigator Follow Up Date: 02/06/2019  Navigator Follow Up Reason: Chemotherapy  Navigator Location [facility]  Referral Date to RadOnc/MedOnc -  Navigator Encounter Type Other/I followed up on Mr. Feagans treatment plan schedule.  All appts are set up at this time.   Telephone -  Patient Visit Type -  Treatment Phase -  Barriers/Navigation Needs Coordination of Care  Education -  Interventions Coordination of Care  Acuity Level 2-Minimal Needs (1-2 Barriers Identified)  Coordination of Care Other  Education Method -  Time Spent with Patient 15

## 2019-02-05 LAB — ACID FAST CULTURE WITH REFLEXED SENSITIVITIES (MYCOBACTERIA)
Acid Fast Culture: NEGATIVE
Acid Fast Culture: NEGATIVE

## 2019-02-06 ENCOUNTER — Inpatient Hospital Stay: Payer: No Typology Code available for payment source

## 2019-02-06 ENCOUNTER — Other Ambulatory Visit: Payer: Self-pay

## 2019-02-06 ENCOUNTER — Ambulatory Visit
Admission: RE | Admit: 2019-02-06 | Discharge: 2019-02-06 | Disposition: A | Discharge status: Home / Self Care | Payer: No Typology Code available for payment source | Source: Ambulatory Visit | Attending: Radiation Oncology | Admitting: Radiation Oncology

## 2019-02-06 VITALS — BP 102/76 | HR 79 | Temp 98.0°F | Resp 20

## 2019-02-06 DIAGNOSIS — F17210 Nicotine dependence, cigarettes, uncomplicated: Secondary | ICD-10-CM | POA: Diagnosis not present

## 2019-02-06 DIAGNOSIS — C3491 Malignant neoplasm of unspecified part of right bronchus or lung: Secondary | ICD-10-CM

## 2019-02-06 DIAGNOSIS — Z79899 Other long term (current) drug therapy: Secondary | ICD-10-CM | POA: Insufficient documentation

## 2019-02-06 DIAGNOSIS — Z7982 Long term (current) use of aspirin: Secondary | ICD-10-CM | POA: Insufficient documentation

## 2019-02-06 DIAGNOSIS — K5900 Constipation, unspecified: Secondary | ICD-10-CM | POA: Diagnosis not present

## 2019-02-06 DIAGNOSIS — Z510 Encounter for antineoplastic radiation therapy: Secondary | ICD-10-CM | POA: Diagnosis not present

## 2019-02-06 DIAGNOSIS — C3411 Malignant neoplasm of upper lobe, right bronchus or lung: Secondary | ICD-10-CM | POA: Insufficient documentation

## 2019-02-06 DIAGNOSIS — E785 Hyperlipidemia, unspecified: Secondary | ICD-10-CM | POA: Insufficient documentation

## 2019-02-06 DIAGNOSIS — R918 Other nonspecific abnormal finding of lung field: Secondary | ICD-10-CM

## 2019-02-06 DIAGNOSIS — Z5111 Encounter for antineoplastic chemotherapy: Secondary | ICD-10-CM | POA: Diagnosis not present

## 2019-02-06 LAB — CMP (CANCER CENTER ONLY)
ALT: 25 U/L (ref 0–44)
AST: 17 U/L (ref 15–41)
Albumin: 3.8 g/dL (ref 3.5–5.0)
Alkaline Phosphatase: 62 U/L (ref 38–126)
Anion gap: 7 (ref 5–15)
BUN: 19 mg/dL (ref 8–23)
CO2: 24 mmol/L (ref 22–32)
Calcium: 9 mg/dL (ref 8.9–10.3)
Chloride: 107 mmol/L (ref 98–111)
Creatinine: 0.9 mg/dL (ref 0.61–1.24)
GFR, Est AFR Am: >60 mL/min (ref >60)
GFR, Estimated: >60 mL/min (ref >60)
Glucose, Bld: 97 mg/dL (ref 70–99)
Potassium: 4.5 mmol/L (ref 3.5–5.1)
Sodium: 138 mmol/L (ref 135–145)
Total Bilirubin: 0.6 mg/dL (ref 0.3–1.2)
Total Protein: 6.9 g/dL (ref 6.5–8.1)

## 2019-02-06 LAB — CBC WITH DIFFERENTIAL (CANCER CENTER ONLY)
Abs Immature Granulocytes: 0.03 10*3/uL (ref 0.00–0.07)
Basophils Absolute: 0.1 10*3/uL (ref 0.0–0.1)
Basophils Relative: 1 %
Eosinophils Absolute: 0.1 10*3/uL (ref 0.0–0.5)
Eosinophils Relative: 2 %
HCT: 43.1 % (ref 39.0–52.0)
Hemoglobin: 14.6 g/dL (ref 13.0–17.0)
Immature Granulocytes: 0 %
Lymphocytes Relative: 29 %
Lymphs Abs: 2.2 10*3/uL (ref 0.7–4.0)
MCH: 31.5 pg (ref 26.0–34.0)
MCHC: 33.9 g/dL (ref 30.0–36.0)
MCV: 93.1 fL (ref 80.0–100.0)
Monocytes Absolute: 0.5 10*3/uL (ref 0.1–1.0)
Monocytes Relative: 7 %
Neutro Abs: 4.7 10*3/uL (ref 1.7–7.7)
Neutrophils Relative %: 61 %
Platelet Count: 230 10*3/uL (ref 150–400)
RBC: 4.63 MIL/uL (ref 4.22–5.81)
RDW: 12.6 % (ref 11.5–15.5)
WBC Count: 7.6 10*3/uL (ref 4.0–10.5)
nRBC: 0 % (ref 0.0–0.2)

## 2019-02-06 MED ORDER — DIPHENHYDRAMINE HCL 50 MG/ML IJ SOLN
INTRAMUSCULAR | Status: AC
  Filled 2019-02-06: qty 1

## 2019-02-06 MED ORDER — DEXAMETHASONE SODIUM PHOSPHATE 10 MG/ML IJ SOLN
INTRAMUSCULAR | Status: AC
  Filled 2019-02-06: qty 1

## 2019-02-06 MED ORDER — PALONOSETRON HCL INJECTION 0.25 MG/5ML
0.2500 mg | Freq: Once | INTRAVENOUS | Status: AC
  Administered 2019-02-06: 14:00:00 0.25 mg via INTRAVENOUS

## 2019-02-06 MED ORDER — SODIUM CHLORIDE 0.9 % IV SOLN
45.0000 mg/m2 | Freq: Once | INTRAVENOUS | Status: AC
  Administered 2019-02-06: 15:00:00 90 mg via INTRAVENOUS
  Filled 2019-02-06: qty 15

## 2019-02-06 MED ORDER — SODIUM CHLORIDE 0.9 % IV SOLN
Freq: Once | INTRAVENOUS | Status: AC
  Filled 2019-02-06: qty 250

## 2019-02-06 MED ORDER — FAMOTIDINE IN NACL 20-0.9 MG/50ML-% IV SOLN
20.0000 mg | Freq: Once | INTRAVENOUS | Status: AC
  Administered 2019-02-06: 14:00:00 20 mg via INTRAVENOUS

## 2019-02-06 MED ORDER — DEXAMETHASONE SODIUM PHOSPHATE 10 MG/ML IJ SOLN
10.0000 mg | Freq: Once | INTRAMUSCULAR | Status: AC
  Administered 2019-02-06: 14:00:00 10 mg via INTRAVENOUS

## 2019-02-06 MED ORDER — SODIUM CHLORIDE 0.9 % IV SOLN
209.2000 mg | Freq: Once | INTRAVENOUS | Status: AC
  Administered 2019-02-06: 16:00:00 210 mg via INTRAVENOUS
  Filled 2019-02-06: qty 21

## 2019-02-06 MED ORDER — PALONOSETRON HCL INJECTION 0.25 MG/5ML
INTRAVENOUS | Status: AC
  Filled 2019-02-06: qty 5

## 2019-02-06 MED ORDER — DIPHENHYDRAMINE HCL 50 MG/ML IJ SOLN
50.0000 mg | Freq: Once | INTRAMUSCULAR | Status: AC
  Administered 2019-02-06: 50 mg via INTRAVENOUS

## 2019-02-06 MED ORDER — FAMOTIDINE IN NACL 20-0.9 MG/50ML-% IV SOLN
INTRAVENOUS | Status: AC
  Filled 2019-02-06: qty 50

## 2019-02-06 NOTE — Progress Notes (Signed)
  Radiation Oncology         [phone] ________________________________  Name: Philip [name] MRN: [id number]  Date: 02/06/2019  DOB: 13-Jan-1956  Simulation Verification Note    ICD-10-CM   1. Adenocarcinoma of right lung, stage 3 (HCC)  C34.91     Status: outpatient  NARRATIVE: The patient was brought to the treatment unit and placed in the planned treatment position. The clinical setup was verified. Then port films were obtained and uploaded to the radiation oncology medical record software.  The treatment beams were carefully compared against the planned radiation fields. The position location and shape of the radiation fields was reviewed. They targeted volume of tissue appears to be appropriately covered by the radiation beams. Organs at risk appear to be excluded as planned.  Based on my personal review, I approved the simulation verification. The patient's treatment will proceed as planned.  -----------------------------------  Blair Promise, PhD, MD

## 2019-02-06 NOTE — Patient Instructions (Signed)
[facility] Discharge Instructions for Patients Receiving Chemotherapy  Today you received the following chemotherapy agents :  Paclitaxel, Carboplatin.  To help prevent nausea and vomiting after your treatment, we encourage you to take your nausea medication as prescribed.  TAKE COMPAZINE 10 MG EVERY 6 HOURS AS NEEDED FOR NAUSEA.  DO NOT DRIVE AS THIS MEDICATION CAN CAUSE DROWSINESS. DO NOT  EAT  GREASY  NOR  SPICY  FOODS.  DO  DRINK  LOTS  OF  FLUIDS  AS  TOLERATED.   If you develop nausea and vomiting that is not controlled by your nausea medication, call the clinic.   BELOW ARE SYMPTOMS THAT SHOULD BE REPORTED IMMEDIATELY:  *FEVER GREATER THAN 100.5 F  *CHILLS WITH OR WITHOUT FEVER  NAUSEA AND VOMITING THAT IS NOT CONTROLLED WITH YOUR NAUSEA MEDICATION  *UNUSUAL SHORTNESS OF BREATH  *UNUSUAL BRUISING OR BLEEDING  TENDERNESS IN MOUTH AND THROAT WITH OR WITHOUT PRESENCE OF ULCERS  *URINARY PROBLEMS  *BOWEL PROBLEMS  UNUSUAL RASH Items with * indicate a potential emergency and should be followed up as soon as possible.  Feel free to call the clinic should you have any questions or concerns. The clinic phone number is [phone].  Please show the [place] at check-in to the Emergency Department and triage nurse.   Paclitaxel injection What is this medicine? PACLITAXEL (PAK li TAX el) is a chemotherapy drug. It targets fast dividing cells, like cancer cells, and causes these cells to die. This medicine is used to treat ovarian cancer, breast cancer, lung cancer, Kaposi's sarcoma, and other cancers. This medicine may be used for other purposes; ask your health care provider or pharmacist if you have questions. COMMON BRAND NAME(S): Onxol, Taxol What should I tell my health care provider before I take this medicine? They need to know if you have any of these conditions:  history of irregular heartbeat  liver disease  low blood counts, like low  white cell, platelet, or red cell counts  lung or breathing disease, like asthma  tingling of the fingers or toes, or other nerve disorder  an unusual or allergic reaction to paclitaxel, alcohol, polyoxyethylated castor oil, other chemotherapy, other medicines, foods, dyes, or preservatives  pregnant or trying to get pregnant  breast-feeding How should I use this medicine? This drug is given as an infusion into a vein. It is administered in a hospital or clinic by a specially trained health care professional. Talk to your pediatrician regarding the use of this medicine in children. Special care may be needed. Overdosage: If you think you have taken too much of this medicine contact a poison control center or emergency room at once. NOTE: This medicine is only for you. Do not share this medicine with others. What if I miss a dose? It is important not to miss your dose. Call your doctor or health care professional if you are unable to keep an appointment. What may interact with this medicine? Do not take this medicine with any of the following medications:  disulfiram  metronidazole This medicine may also interact with the following medications:  antiviral medicines for hepatitis, HIV or AIDS  certain antibiotics like erythromycin and clarithromycin  certain medicines for fungal infections like ketoconazole and itraconazole  certain medicines for seizures like carbamazepine, phenobarbital, phenytoin  gemfibrozil  nefazodone  rifampin  St. John's wort This list may not describe all possible interactions. Give your health care provider a list of all the medicines, herbs, non-prescription  drugs, or dietary supplements you use. Also tell them if you smoke, drink alcohol, or use illegal drugs. Some items may interact with your medicine. What should I watch for while using this medicine? Your condition will be monitored carefully while you are receiving this medicine. You will need  important blood work done while you are taking this medicine. This medicine can cause serious allergic reactions. To reduce your risk you will need to take other medicine(s) before treatment with this medicine. If you experience allergic reactions like skin rash, itching or hives, swelling of the face, lips, or tongue, tell your doctor or health care professional right away. In some cases, you may be given additional medicines to help with side effects. Follow all directions for their use. This drug may make you feel generally unwell. This is not uncommon, as chemotherapy can affect healthy cells as well as cancer cells. Report any side effects. Continue your course of treatment even though you feel ill unless your doctor tells you to stop. Call your doctor or health care professional for advice if you get a fever, chills or sore throat, or other symptoms of a cold or flu. Do not treat yourself. This drug decreases your body's ability to fight infections. Try to avoid being around people who are sick. This medicine may increase your risk to bruise or bleed. Call your doctor or health care professional if you notice any unusual bleeding. Be careful brushing and flossing your teeth or using a toothpick because you may get an infection or bleed more easily. If you have any dental work done, tell your dentist you are receiving this medicine. Avoid taking products that contain aspirin, acetaminophen, ibuprofen, naproxen, or ketoprofen unless instructed by your doctor. These medicines may hide a fever. Do not become pregnant while taking this medicine. Women should inform their doctor if they wish to become pregnant or think they might be pregnant. There is a potential for serious side effects to an unborn child. Talk to your health care professional or pharmacist for more information. Do not breast-feed an infant while taking this medicine. Men are advised not to father a child while receiving this  medicine. This product may contain alcohol. Ask your pharmacist or healthcare provider if this medicine contains alcohol. Be sure to tell all healthcare providers you are taking this medicine. Certain medicines, like metronidazole and disulfiram, can cause an unpleasant reaction when taken with alcohol. The reaction includes flushing, headache, nausea, vomiting, sweating, and increased thirst. The reaction can last from 30 minutes to several hours. What side effects may I notice from receiving this medicine? Side effects that you should report to your doctor or health care professional as soon as possible:  allergic reactions like skin rash, itching or hives, swelling of the face, lips, or tongue  breathing problems  changes in vision  fast, irregular heartbeat  high or low blood pressure  mouth sores  pain, tingling, numbness in the hands or feet  signs of decreased platelets or bleeding - bruising, pinpoint red spots on the skin, black, tarry stools, blood in the urine  signs of decreased red blood cells - unusually weak or tired, feeling faint or lightheaded, falls  signs of infection - fever or chills, cough, sore throat, pain or difficulty passing urine  signs and symptoms of liver injury like dark yellow or brown urine; general ill feeling or flu-like symptoms; light-colored stools; loss of appetite; nausea; right upper belly pain; unusually weak or tired; yellowing of the  eyes or skin  swelling of the ankles, feet, hands  unusually slow heartbeat Side effects that usually do not require medical attention (report to your doctor or health care professional if they continue or are bothersome):  diarrhea  hair loss  loss of appetite  muscle or joint pain  nausea, vomiting  pain, redness, or irritation at site where injected  tiredness This list may not describe all possible side effects. Call your doctor for medical advice about side effects. You may report side  effects to FDA at 1-800-FDA-1088. Where should I keep my medicine? This drug is given in a hospital or clinic and will not be stored at home. NOTE: This sheet is a summary. It may not cover all possible information. If you have questions about this medicine, talk to your doctor, pharmacist, or health care provider.  2020 Elsevier/Gold Standard (2016-09-22 13:14:55)   Carboplatin injection What is this medicine? CARBOPLATIN (KAR boe pla tin) is a chemotherapy drug. It targets fast dividing cells, like cancer cells, and causes these cells to die. This medicine is used to treat ovarian cancer and many other cancers. This medicine may be used for other purposes; ask your health care provider or pharmacist if you have questions. COMMON BRAND NAME(S): Paraplatin What should I tell my health care provider before I take this medicine? They need to know if you have any of these conditions:  blood disorders  hearing problems  kidney disease  recent or ongoing radiation therapy  an unusual or allergic reaction to carboplatin, cisplatin, other chemotherapy, other medicines, foods, dyes, or preservatives  pregnant or trying to get pregnant  breast-feeding How should I use this medicine? This drug is usually given as an infusion into a vein. It is administered in a hospital or clinic by a specially trained health care professional. Talk to your pediatrician regarding the use of this medicine in children. Special care may be needed. Overdosage: If you think you have taken too much of this medicine contact a poison control center or emergency room at once. NOTE: This medicine is only for you. Do not share this medicine with others. What if I miss a dose? It is important not to miss a dose. Call your doctor or health care professional if you are unable to keep an appointment. What may interact with this medicine?  medicines for seizures  medicines to increase blood counts like filgrastim,  pegfilgrastim, sargramostim  some antibiotics like amikacin, gentamicin, neomycin, streptomycin, tobramycin  vaccines Talk to your doctor or health care professional before taking any of these medicines:  acetaminophen  aspirin  ibuprofen  ketoprofen  naproxen This list may not describe all possible interactions. Give your health care provider a list of all the medicines, herbs, non-prescription drugs, or dietary supplements you use. Also tell them if you smoke, drink alcohol, or use illegal drugs. Some items may interact with your medicine. What should I watch for while using this medicine? Your condition will be monitored carefully while you are receiving this medicine. You will need important blood work done while you are taking this medicine. This drug may make you feel generally unwell. This is not uncommon, as chemotherapy can affect healthy cells as well as cancer cells. Report any side effects. Continue your course of treatment even though you feel ill unless your doctor tells you to stop. In some cases, you may be given additional medicines to help with side effects. Follow all directions for their use. Call your doctor or health  care professional for advice if you get a fever, chills or sore throat, or other symptoms of a cold or flu. Do not treat yourself. This drug decreases your body's ability to fight infections. Try to avoid being around people who are sick. This medicine may increase your risk to bruise or bleed. Call your doctor or health care professional if you notice any unusual bleeding. Be careful brushing and flossing your teeth or using a toothpick because you may get an infection or bleed more easily. If you have any dental work done, tell your dentist you are receiving this medicine. Avoid taking products that contain aspirin, acetaminophen, ibuprofen, naproxen, or ketoprofen unless instructed by your doctor. These medicines may hide a fever. Do not become pregnant  while taking this medicine. Women should inform their doctor if they wish to become pregnant or think they might be pregnant. There is a potential for serious side effects to an unborn child. Talk to your health care professional or pharmacist for more information. Do not breast-feed an infant while taking this medicine. What side effects may I notice from receiving this medicine? Side effects that you should report to your doctor or health care professional as soon as possible:  allergic reactions like skin rash, itching or hives, swelling of the face, lips, or tongue  signs of infection - fever or chills, cough, sore throat, pain or difficulty passing urine  signs of decreased platelets or bleeding - bruising, pinpoint red spots on the skin, black, tarry stools, nosebleeds  signs of decreased red blood cells - unusually weak or tired, fainting spells, lightheadedness  breathing problems  changes in hearing  changes in vision  chest pain  high blood pressure  low blood counts - This drug may decrease the number of white blood cells, red blood cells and platelets. You may be at increased risk for infections and bleeding.  nausea and vomiting  pain, swelling, redness or irritation at the injection site  pain, tingling, numbness in the hands or feet  problems with balance, talking, walking  trouble passing urine or change in the amount of urine Side effects that usually do not require medical attention (report to your doctor or health care professional if they continue or are bothersome):  hair loss  loss of appetite  metallic taste in the mouth or changes in taste This list may not describe all possible side effects. Call your doctor for medical advice about side effects. You may report side effects to FDA at 1-800-FDA-1088. Where should I keep my medicine? This drug is given in a hospital or clinic and will not be stored at home. NOTE: This sheet is a summary. It may not  cover all possible information. If you have questions about this medicine, talk to your doctor, pharmacist, or health care provider.  2020 Elsevier/Gold Standard (2007-04-26 14:38:05)

## 2019-02-06 NOTE — Progress Notes (Signed)
Spoke w/ Dr. Julien Nordmann, keep patient's carboplatin dose at 210mg  today and for the remainder of the patient's treatment (do not increase dose for improved renal function). Only notify MD if dose reduction were to be needed based on SCr results.   Demetrius Charity, PharmD, [facility] Pharmacist Pharmacy Phone: [phone] 02/06/2019

## 2019-02-07 ENCOUNTER — Ambulatory Visit
Admission: RE | Admit: 2019-02-07 | Discharge: 2019-02-07 | Disposition: A | Discharge status: Home / Self Care | Payer: No Typology Code available for payment source | Source: Ambulatory Visit | Attending: Radiation Oncology | Admitting: Radiation Oncology

## 2019-02-07 ENCOUNTER — Other Ambulatory Visit: Payer: Self-pay

## 2019-02-07 DIAGNOSIS — C3491 Malignant neoplasm of unspecified part of right bronchus or lung: Secondary | ICD-10-CM | POA: Diagnosis not present

## 2019-02-07 LAB — FUNGAL ORGANISM REFLEX

## 2019-02-07 LAB — FUNGUS CULTURE WITH STAIN

## 2019-02-07 LAB — FUNGUS CULTURE RESULT

## 2019-02-08 ENCOUNTER — Ambulatory Visit
Admission: RE | Admit: 2019-02-08 | Discharge: 2019-02-08 | Disposition: A | Discharge status: Home / Self Care | Payer: No Typology Code available for payment source | Source: Ambulatory Visit | Attending: Radiation Oncology | Admitting: Radiation Oncology

## 2019-02-08 DIAGNOSIS — C3491 Malignant neoplasm of unspecified part of right bronchus or lung: Secondary | ICD-10-CM | POA: Diagnosis not present

## 2019-02-09 ENCOUNTER — Other Ambulatory Visit: Payer: Self-pay

## 2019-02-09 ENCOUNTER — Ambulatory Visit
Admission: RE | Admit: 2019-02-09 | Discharge: 2019-02-09 | Disposition: A | Discharge status: Home / Self Care | Payer: No Typology Code available for payment source | Source: Ambulatory Visit | Attending: Radiation Oncology | Admitting: Radiation Oncology

## 2019-02-09 DIAGNOSIS — C3491 Malignant neoplasm of unspecified part of right bronchus or lung: Secondary | ICD-10-CM | POA: Diagnosis not present

## 2019-02-10 ENCOUNTER — Ambulatory Visit
Admission: RE | Admit: 2019-02-10 | Discharge: 2019-02-10 | Disposition: A | Discharge status: Home / Self Care | Payer: No Typology Code available for payment source | Source: Ambulatory Visit | Attending: Radiation Oncology | Admitting: Radiation Oncology

## 2019-02-10 ENCOUNTER — Other Ambulatory Visit: Payer: Self-pay

## 2019-02-10 DIAGNOSIS — C3491 Malignant neoplasm of unspecified part of right bronchus or lung: Secondary | ICD-10-CM | POA: Diagnosis not present

## 2019-02-10 LAB — GUARDANT 360

## 2019-02-13 ENCOUNTER — Encounter: Payer: Self-pay | Admitting: *Deleted

## 2019-02-13 ENCOUNTER — Inpatient Hospital Stay: Payer: No Typology Code available for payment source

## 2019-02-13 ENCOUNTER — Inpatient Hospital Stay (HOSPITAL_BASED_OUTPATIENT_CLINIC_OR_DEPARTMENT_OTHER): Payer: No Typology Code available for payment source | Admitting: Internal Medicine

## 2019-02-13 ENCOUNTER — Other Ambulatory Visit: Payer: Self-pay

## 2019-02-13 ENCOUNTER — Ambulatory Visit
Admission: RE | Admit: 2019-02-13 | Discharge: 2019-02-13 | Disposition: A | Discharge status: Home / Self Care | Payer: No Typology Code available for payment source | Source: Ambulatory Visit | Attending: Radiation Oncology | Admitting: Radiation Oncology

## 2019-02-13 ENCOUNTER — Inpatient Hospital Stay (HOSPITAL_BASED_OUTPATIENT_CLINIC_OR_DEPARTMENT_OTHER): Payer: No Typology Code available for payment source | Admitting: Medical

## 2019-02-13 ENCOUNTER — Encounter: Payer: Self-pay | Admitting: Internal Medicine

## 2019-02-13 VITALS — BP 119/83 | HR 82 | Temp 97.9°F | Resp 18 | Ht 66.0 in | Wt 185.6 lb

## 2019-02-13 VITALS — BP 121/87 | HR 65 | Resp 16

## 2019-02-13 DIAGNOSIS — C3491 Malignant neoplasm of unspecified part of right bronchus or lung: Secondary | ICD-10-CM

## 2019-02-13 DIAGNOSIS — R918 Other nonspecific abnormal finding of lung field: Secondary | ICD-10-CM

## 2019-02-13 DIAGNOSIS — T8090XA Unspecified complication following infusion and therapeutic injection, initial encounter: Secondary | ICD-10-CM

## 2019-02-13 DIAGNOSIS — Z5111 Encounter for antineoplastic chemotherapy: Secondary | ICD-10-CM

## 2019-02-13 LAB — CBC WITH DIFFERENTIAL (CANCER CENTER ONLY)
Abs Immature Granulocytes: 0.05 10*3/uL (ref 0.00–0.07)
Basophils Absolute: 0 10*3/uL (ref 0.0–0.1)
Basophils Relative: 1 %
Eosinophils Absolute: 0.1 10*3/uL (ref 0.0–0.5)
Eosinophils Relative: 3 %
HCT: 41.5 % (ref 39.0–52.0)
Hemoglobin: 14.3 g/dL (ref 13.0–17.0)
Immature Granulocytes: 1 %
Lymphocytes Relative: 20 %
Lymphs Abs: 0.8 10*3/uL (ref 0.7–4.0)
MCH: 31.4 pg (ref 26.0–34.0)
MCHC: 34.5 g/dL (ref 30.0–36.0)
MCV: 91.2 fL (ref 80.0–100.0)
Monocytes Absolute: 0.3 10*3/uL (ref 0.1–1.0)
Monocytes Relative: 7 %
Neutro Abs: 2.8 10*3/uL (ref 1.7–7.7)
Neutrophils Relative %: 68 %
Platelet Count: 210 10*3/uL (ref 150–400)
RBC: 4.55 MIL/uL (ref 4.22–5.81)
RDW: 12.6 % (ref 11.5–15.5)
WBC Count: 4.1 10*3/uL (ref 4.0–10.5)
nRBC: 0 % (ref 0.0–0.2)

## 2019-02-13 LAB — CMP (CANCER CENTER ONLY)
ALT: 13 U/L (ref 0–44)
AST: 12 U/L — ABNORMAL LOW (ref 15–41)
Albumin: 3.7 g/dL (ref 3.5–5.0)
Alkaline Phosphatase: 72 U/L (ref 38–126)
Anion gap: 10 (ref 5–15)
BUN: 19 mg/dL (ref 8–23)
CO2: 21 mmol/L — ABNORMAL LOW (ref 22–32)
Calcium: 8.6 mg/dL — ABNORMAL LOW (ref 8.9–10.3)
Chloride: 109 mmol/L (ref 98–111)
Creatinine: 0.84 mg/dL (ref 0.61–1.24)
GFR, Est AFR Am: >60 mL/min (ref >60)
GFR, Estimated: >60 mL/min (ref >60)
Glucose, Bld: 116 mg/dL — ABNORMAL HIGH (ref 70–99)
Potassium: 4.3 mmol/L (ref 3.5–5.1)
Sodium: 140 mmol/L (ref 135–145)
Total Bilirubin: 0.3 mg/dL (ref 0.3–1.2)
Total Protein: 6.8 g/dL (ref 6.5–8.1)

## 2019-02-13 MED ORDER — PALONOSETRON HCL INJECTION 0.25 MG/5ML
INTRAVENOUS | Status: AC
  Filled 2019-02-13: qty 5

## 2019-02-13 MED ORDER — SODIUM CHLORIDE 0.9 % IV SOLN
210.0000 mg | Freq: Once | INTRAVENOUS | Status: AC
  Administered 2019-02-13: 210 mg via INTRAVENOUS
  Filled 2019-02-13: qty 21

## 2019-02-13 MED ORDER — SODIUM CHLORIDE 0.9 % IV SOLN
Freq: Once | INTRAVENOUS | Status: AC
  Filled 2019-02-13: qty 250

## 2019-02-13 MED ORDER — DEXAMETHASONE SODIUM PHOSPHATE 10 MG/ML IJ SOLN
INTRAMUSCULAR | Status: AC
  Filled 2019-02-13: qty 1

## 2019-02-13 MED ORDER — PALONOSETRON HCL INJECTION 0.25 MG/5ML
0.2500 mg | Freq: Once | INTRAVENOUS | Status: AC
  Administered 2019-02-13: 12:00:00 0.25 mg via INTRAVENOUS

## 2019-02-13 MED ORDER — METHYLPREDNISOLONE SODIUM SUCC 125 MG IJ SOLR
125.0000 mg | Freq: Once | INTRAMUSCULAR | Status: AC | PRN
  Administered 2019-02-13: 13:00:00 125 mg via INTRAVENOUS

## 2019-02-13 MED ORDER — DIPHENHYDRAMINE HCL 50 MG/ML IJ SOLN
50.0000 mg | Freq: Once | INTRAMUSCULAR | Status: AC | PRN
  Administered 2019-02-13: 13:00:00 50 mg via INTRAVENOUS

## 2019-02-13 MED ORDER — DEXAMETHASONE SODIUM PHOSPHATE 10 MG/ML IJ SOLN
10.0000 mg | Freq: Once | INTRAMUSCULAR | Status: AC
  Administered 2019-02-13: 12:00:00 10 mg via INTRAVENOUS

## 2019-02-13 MED ORDER — DIPHENHYDRAMINE HCL 50 MG/ML IJ SOLN
50.0000 mg | Freq: Once | INTRAMUSCULAR | Status: AC
  Administered 2019-02-13: 50 mg via INTRAVENOUS

## 2019-02-13 MED ORDER — FAMOTIDINE IN NACL 20-0.9 MG/50ML-% IV SOLN
INTRAVENOUS | Status: AC
  Filled 2019-02-13: qty 50

## 2019-02-13 MED ORDER — FAMOTIDINE IN NACL 20-0.9 MG/50ML-% IV SOLN
20.0000 mg | Freq: Once | INTRAVENOUS | Status: AC
  Administered 2019-02-13: 12:00:00 20 mg via INTRAVENOUS

## 2019-02-13 MED ORDER — MORPHINE SULFATE (PF) 4 MG/ML IV SOLN
INTRAVENOUS | Status: AC
  Filled 2019-02-13: qty 1

## 2019-02-13 MED ORDER — SODIUM CHLORIDE 0.9 % IV SOLN
45.0000 mg/m2 | Freq: Once | INTRAVENOUS | Status: AC
  Administered 2019-02-13: 13:00:00 90 mg via INTRAVENOUS
  Filled 2019-02-13: qty 15

## 2019-02-13 MED ORDER — FAMOTIDINE IN NACL 20-0.9 MG/50ML-% IV SOLN
20.0000 mg | Freq: Once | INTRAVENOUS | Status: AC | PRN
  Administered 2019-02-13: 13:00:00 20 mg via INTRAVENOUS

## 2019-02-13 MED ORDER — DIPHENHYDRAMINE HCL 50 MG/ML IJ SOLN
INTRAMUSCULAR | Status: AC
  Filled 2019-02-13: qty 1

## 2019-02-13 NOTE — Progress Notes (Signed)
Paclitaxel infusion completed without further incident.

## 2019-02-13 NOTE — Progress Notes (Signed)
    DATE:  02/13/2019                                          X  CHEMO/IMMUNOTHERAPY REACTION            MD:  Dr. Fanny Bien. Mohamed   AGENT/BLOOD PRODUCT RECEIVING TODAY:               Carboplatin and paclitaxel   AGENT/BLOOD PRODUCT RECEIVING IMMEDIATELY PRIOR TO REACTION:           Paclitaxel   VS: BP:      128/87   P:        72       SPO2:        100% on room air                  REACTION(S):           Back pain, shortness of breath, and facial erythema   PREMEDS:       Aloxi, Benadryl 50 mg IV, Decadron 10 mg IV Pepcid 20 mg IV   INTERVENTION: Benadryl 20 mg IV x1, Solu-Medrol 125 mg IV x1, and Pepcid 20 mg IV x1   Review of Systems  Review of Systems  Constitutional: Negative for chills, diaphoresis and fever.  HENT: Negative for trouble swallowing and voice change.   Respiratory: Positive for shortness of breath. Negative for cough, chest tightness and wheezing.   Cardiovascular: Negative for chest pain and palpitations.  Gastrointestinal: Negative for abdominal pain, constipation, diarrhea, nausea and vomiting.  Musculoskeletal: Positive for back pain. Negative for myalgias.  Skin:       Facial erythema  Neurological: Negative for dizziness, light-headedness and headaches.     Physical Exam  Physical Exam Constitutional:      General: He is not in acute distress.    Appearance: He is not diaphoretic.  HENT:     Head: Normocephalic and atraumatic.  Eyes:     General: No scleral icterus.       Right eye: No discharge.        Left eye: No discharge.  Cardiovascular:     Rate and Rhythm: Normal rate and regular rhythm.     Heart sounds: Normal heart sounds. No murmur. No friction rub. No gallop.   Pulmonary:     Effort: Pulmonary effort is normal. No respiratory distress.     Breath sounds: Normal breath sounds. No wheezing or rales.  Skin:    General: Skin is warm and dry.     Findings: No erythema or rash.  Neurological:     Mental Status: He is alert.      OUTCOME:                 The patient's symptoms resolved after Taxol was paused and he was given the above intervention.  He was able to restart Taxol and complete his chemotherapy today without further issues of concern.   Sandi Mealy, MHS, [name]

## 2019-02-13 NOTE — Progress Notes (Signed)
[facility] Telephone:([phone]   [phone]  OFFICE PROGRESS NOTE  [name], MD [place] [address] [place] [address]  DIAGNOSIS: Stage IIIc (T4,N3, M0)non-small cell lung cancer, adenocarcinoma presented with large right hilar mass in addition to bilateral hilar and mediastinal lymphadenopathy diagnosed in December 2020.  PRIOR THERAPY: None  CURRENT THERAPY: Weekly concurrent chemoradiation with carboplatin for an AUC of 2 and paclitaxel 45 mg/m2. First dose starting 01/30/2019.   Status post 2 cycles.  INTERVAL HISTORY: Philip [name] 64 y.o. male returns to the clinic today for follow-up visit.  The patient is feeling fine today with no concerning complaints except for intermittent constipation.  He denied having any chest pain, shortness of breath, cough or hemoptysis.  He denied having any fever or chills.  He has no nausea, vomiting or diarrhea.  He has no headache or visual changes.  He has been tolerating his concurrent chemoradiation fairly well.  The patient is here today for evaluation before starting cycle #3 of his treatment.  MEDICAL HISTORY: Past Medical History:  Diagnosis Date  . Hyperlipidemia   . Pneumonia   . Tobacco abuse     ALLERGIES:  is allergic to norco [hydrocodone-acetaminophen].  MEDICATIONS:  Current Outpatient Medications  Medication Sig Dispense Refill  . aspirin 325 MG tablet Take 650 mg by mouth daily as needed for moderate pain.    . Aspirin-Acetaminophen-Caffeine (GOODY HEADACHE PO) Take 1 packet by mouth daily as needed (headaches).    Marland Kitchen atorvastatin (LIPITOR) 20 MG tablet Take 20 mg by mouth daily.    Marland Kitchen bismuth subsalicylate (PEPTO BISMOL) 262 MG/15ML suspension Take 30 mLs by mouth every 6 (six) hours as needed for indigestion.    . prochlorperazine (COMPAZINE) 10 MG tablet Take 1 tablet (10 mg total) by mouth every 6 (six) hours as needed for nausea or vomiting. 30 tablet 2  .  sildenafil (REVATIO) 20 MG tablet Take 20-60 mg by mouth daily as needed (ED).     . Tetrahydrozoline HCl (VISINE OP) Place 1 drop into both eyes daily as needed (redness).    Marland Kitchen albuterol (VENTOLIN HFA) 108 (90 Base) MCG/ACT inhaler Inhale 2 puffs into the lungs every 6 (six) hours as needed for wheezing or shortness of breath. (Patient not taking: Reported on 02/06/2019) 1 g 0   No current facility-administered medications for this visit.    SURGICAL HISTORY:  Past Surgical History:  Procedure Laterality Date  . COLONOSCOPY    . HAND SURGERY    . VIDEO BRONCHOSCOPY WITH ENDOBRONCHIAL ULTRASOUND N/A 12/22/2018   Procedure: VIDEO BRONCHOSCOPY WITH ENDOBRONCHIAL ULTRASOUND;  Surgeon: Garner Nash, DO;  Location: [facility];  Service: Thoracic;  Laterality: N/A;  . VIDEO BRONCHOSCOPY WITH ENDOBRONCHIAL ULTRASOUND N/A 01/09/2019   Procedure: VIDEO BRONCHOSCOPY WITH ENDOBRONCHIAL ULTRASOUND WITH FLUORO;  Surgeon: Garner Nash, DO;  Location: [facility];  Service: Thoracic;  Laterality: N/A;  . VIDEO BRONCHOSCOPY WITH RADIAL ENDOBRONCHIAL ULTRASOUND N/A 12/22/2018   Procedure: RADIAL ENDOBRONCHIAL ULTRASOUND;  Surgeon: Garner Nash, DO;  Location: [facility];  Service: Thoracic;  Laterality: N/A;    REVIEW OF SYSTEMS:  A comprehensive review of systems was negative except for: Gastrointestinal: positive for constipation   PHYSICAL EXAMINATION: General appearance: alert, cooperative and no distress Head: Normocephalic, without obvious abnormality, atraumatic Neck: no adenopathy, no JVD, supple, symmetrical, trachea midline and thyroid not enlarged, symmetric, no tenderness/mass/nodules Lymph nodes: Cervical, supraclavicular, and axillary nodes normal. Resp:  clear to auscultation bilaterally Back: symmetric, no curvature. ROM normal. No CVA tenderness. Cardio: regular rate and rhythm, S1, S2 normal, no murmur, click, rub or gallop GI: soft, non-tender; bowel sounds normal; no masses,  no  organomegaly Extremities: extremities normal, atraumatic, no cyanosis or edema  ECOG PERFORMANCE STATUS: 1 - Symptomatic but completely ambulatory  Blood pressure 119/83, pulse 82, temperature 97.9 F (36.6 C), temperature source Temporal, resp. rate 18, height 5\' 6"  (1.676 m), weight 185 lb 9.6 oz (84.2 kg), SpO2 98 %.  LABORATORY DATA: Lab Results  Component Value Date   WBC 4.1 02/13/2019   HGB 14.3 02/13/2019   HCT 41.5 02/13/2019   MCV 91.2 02/13/2019   PLT 210 02/13/2019      Chemistry      Component Value Date/Time   NA 138 02/06/2019 1150   K 4.5 02/06/2019 1150   CL 107 02/06/2019 1150   CO2 24 02/06/2019 1150   BUN 19 02/06/2019 1150   CREATININE 0.90 02/06/2019 1150      Component Value Date/Time   CALCIUM 9.0 02/06/2019 1150   ALKPHOS 62 02/06/2019 1150   AST 17 02/06/2019 1150   ALT 25 02/06/2019 1150   BILITOT 0.6 02/06/2019 1150       RADIOGRAPHIC STUDIES: MR Brain W Wo Contrast  Result Date: 01/23/2019 CLINICAL DATA:  Lung cancer, initial staging EXAM: MRI HEAD WITHOUT AND WITH CONTRAST TECHNIQUE: Multiplanar, multiecho pulse sequences of the brain and surrounding structures were obtained without and with intravenous contrast. CONTRAST:  53mL GADAVIST GADOBUTROL 1 MMOL/ML IV SOLN COMPARISON:  2010, no recent imaging available FINDINGS: Brain: There is no acute infarction or intracranial hemorrhage. There is no intracranial mass, mass effect, or edema. There is no hydrocephalus or extra-axial fluid collection. Minimal patchy foci of T2 hyperintensity in the supratentorial white matter are nonspecific and may reflect minor chronic microvascular ischemic changes. No abnormal enhancement. Vascular: Major vessel flow voids at the skull base are preserved. Skull and upper cervical spine: Normal marrow signal is preserved. Sinuses/Orbits: Mild mucosal thickening.  Orbits are unremarkable. Other: Sella is unremarkable.  Mastoid air cells are clear. IMPRESSION: No  evidence of metastatic disease. Electronically Signed   By: Macy Mis M.D.   On: 01/23/2019 20:42    ASSESSMENT AND PLAN: This is a very pleasant 64 years old white male with a stage IIIc non-small cell lung cancer, adenocarcinoma diagnosed in December 2020 and he is currently undergoing a course of concurrent chemoradiation with weekly carboplatin and paclitaxel status post 2 cycles.  The patient has been tolerating this treatment well with no concerning adverse effects. I recommended for him to proceed with cycle #3 today as planned. For constipation he is currently using prune juice and it is working for him. I will see him back for follow-up visit in 2 weeks for evaluation before starting cycle #5. The patient was advised to call immediately if he has any concerning symptoms in the interval. The patient voices understanding of current disease status and treatment options and is in agreement with the current care plan.  All questions were answered. The patient knows to call the clinic with any problems, questions or concerns. We can certainly see the patient much sooner if necessary.  Disclaimer: This note was dictated with voice recognition software. Similar sounding words can inadvertently be transcribed and may not be corrected upon review.

## 2019-02-13 NOTE — Progress Notes (Signed)
During first few minutes of paclitaxel infusion, patient began to c/o chest tightness, dyspnea, anxiety, and severe back pain. Paclitaxel paused, hypersensitivity protocol initiated, and Sandi Mealy [name] called to evaluate patient in treatment room. Medicated as documented in [facility]. Patient quickly returned to baseline. Sandi Mealy spoke with Dr. Julien Nordmann regarding reaction. Dr. Julien Nordmann advised to resume infusion and continue as tolerated. Paclitaxel infusion resumed.

## 2019-02-13 NOTE — Patient Instructions (Signed)
[facility] Discharge Instructions for Patients Receiving Chemotherapy  Today you received the following chemotherapy agents: paclitaxel and carboplatin.  To help prevent nausea and vomiting after your treatment, we encourage you to take your nausea medication as directed.   If you develop nausea and vomiting that is not controlled by your nausea medication, call the clinic.   BELOW ARE SYMPTOMS THAT SHOULD BE REPORTED IMMEDIATELY:  *FEVER GREATER THAN 100.5 F  *CHILLS WITH OR WITHOUT FEVER  NAUSEA AND VOMITING THAT IS NOT CONTROLLED WITH YOUR NAUSEA MEDICATION  *UNUSUAL SHORTNESS OF BREATH  *UNUSUAL BRUISING OR BLEEDING  TENDERNESS IN MOUTH AND THROAT WITH OR WITHOUT PRESENCE OF ULCERS  *URINARY PROBLEMS  *BOWEL PROBLEMS  UNUSUAL RASH Items with * indicate a potential emergency and should be followed up as soon as possible.  Feel free to call the clinic should you have any questions or concerns. The clinic phone number is [phone].  Please show the CHEMO ALERT CARD at check-in to the Emergency Department and triage nurse.   

## 2019-02-14 ENCOUNTER — Other Ambulatory Visit: Payer: Self-pay

## 2019-02-14 ENCOUNTER — Ambulatory Visit
Admission: RE | Admit: 2019-02-14 | Discharge: 2019-02-14 | Disposition: A | Discharge status: Home / Self Care | Payer: No Typology Code available for payment source | Source: Ambulatory Visit | Attending: Radiation Oncology | Admitting: Radiation Oncology

## 2019-02-14 DIAGNOSIS — C3491 Malignant neoplasm of unspecified part of right bronchus or lung: Secondary | ICD-10-CM | POA: Diagnosis not present

## 2019-02-15 ENCOUNTER — Ambulatory Visit
Admission: RE | Admit: 2019-02-15 | Discharge: 2019-02-15 | Disposition: A | Discharge status: Home / Self Care | Payer: No Typology Code available for payment source | Source: Ambulatory Visit | Attending: Radiation Oncology | Admitting: Radiation Oncology

## 2019-02-15 ENCOUNTER — Inpatient Hospital Stay (HOSPITAL_BASED_OUTPATIENT_CLINIC_OR_DEPARTMENT_OTHER): Payer: No Typology Code available for payment source | Admitting: *Deleted

## 2019-02-15 ENCOUNTER — Other Ambulatory Visit: Payer: Self-pay

## 2019-02-15 DIAGNOSIS — C3491 Malignant neoplasm of unspecified part of right bronchus or lung: Secondary | ICD-10-CM

## 2019-02-16 ENCOUNTER — Ambulatory Visit
Admission: RE | Admit: 2019-02-16 | Discharge: 2019-02-16 | Disposition: A | Discharge status: Home / Self Care | Payer: No Typology Code available for payment source | Source: Ambulatory Visit | Attending: Radiation Oncology | Admitting: Radiation Oncology

## 2019-02-16 ENCOUNTER — Other Ambulatory Visit: Payer: Self-pay

## 2019-02-16 DIAGNOSIS — C3491 Malignant neoplasm of unspecified part of right bronchus or lung: Secondary | ICD-10-CM | POA: Diagnosis not present

## 2019-02-17 ENCOUNTER — Other Ambulatory Visit: Payer: Self-pay

## 2019-02-17 ENCOUNTER — Ambulatory Visit
Admission: RE | Admit: 2019-02-17 | Discharge: 2019-02-17 | Disposition: A | Discharge status: Home / Self Care | Payer: No Typology Code available for payment source | Source: Ambulatory Visit | Attending: Radiation Oncology | Admitting: Radiation Oncology

## 2019-02-17 DIAGNOSIS — C3491 Malignant neoplasm of unspecified part of right bronchus or lung: Secondary | ICD-10-CM | POA: Diagnosis not present

## 2019-02-20 ENCOUNTER — Ambulatory Visit
Admission: RE | Admit: 2019-02-20 | Discharge: 2019-02-20 | Disposition: A | Discharge status: Home / Self Care | Payer: No Typology Code available for payment source | Source: Ambulatory Visit | Attending: Radiation Oncology | Admitting: Radiation Oncology

## 2019-02-20 ENCOUNTER — Inpatient Hospital Stay: Payer: No Typology Code available for payment source

## 2019-02-20 ENCOUNTER — Other Ambulatory Visit: Payer: Self-pay

## 2019-02-20 VITALS — BP 101/72 | HR 100 | Temp 98.0°F | Resp 18 | Wt 180.5 lb

## 2019-02-20 DIAGNOSIS — R918 Other nonspecific abnormal finding of lung field: Secondary | ICD-10-CM

## 2019-02-20 DIAGNOSIS — C3491 Malignant neoplasm of unspecified part of right bronchus or lung: Secondary | ICD-10-CM | POA: Diagnosis not present

## 2019-02-20 LAB — CBC WITH DIFFERENTIAL (CANCER CENTER ONLY)
Abs Immature Granulocytes: 0.04 10*3/uL (ref 0.00–0.07)
Basophils Absolute: 0 10*3/uL (ref 0.0–0.1)
Basophils Relative: 1 %
Eosinophils Absolute: 0.1 10*3/uL (ref 0.0–0.5)
Eosinophils Relative: 1 %
HCT: 44.4 % (ref 39.0–52.0)
Hemoglobin: 15.5 g/dL (ref 13.0–17.0)
Immature Granulocytes: 1 %
Lymphocytes Relative: 13 %
Lymphs Abs: 0.5 10*3/uL — ABNORMAL LOW (ref 0.7–4.0)
MCH: 32 pg (ref 26.0–34.0)
MCHC: 34.9 g/dL (ref 30.0–36.0)
MCV: 91.5 fL (ref 80.0–100.0)
Monocytes Absolute: 0.4 10*3/uL (ref 0.1–1.0)
Monocytes Relative: 12 %
Neutro Abs: 2.5 10*3/uL (ref 1.7–7.7)
Neutrophils Relative %: 72 %
Platelet Count: 184 10*3/uL (ref 150–400)
RBC: 4.85 MIL/uL (ref 4.22–5.81)
RDW: 12.8 % (ref 11.5–15.5)
WBC Count: 3.5 10*3/uL — ABNORMAL LOW (ref 4.0–10.5)
nRBC: 0 % (ref 0.0–0.2)

## 2019-02-20 LAB — CMP (CANCER CENTER ONLY)
ALT: 19 U/L (ref 0–44)
AST: 16 U/L (ref 15–41)
Albumin: 3.7 g/dL (ref 3.5–5.0)
Alkaline Phosphatase: 71 U/L (ref 38–126)
Anion gap: 9 (ref 5–15)
BUN: 15 mg/dL (ref 8–23)
CO2: 24 mmol/L (ref 22–32)
Calcium: 8.8 mg/dL — ABNORMAL LOW (ref 8.9–10.3)
Chloride: 107 mmol/L (ref 98–111)
Creatinine: 0.95 mg/dL (ref 0.61–1.24)
GFR, Est AFR Am: >60 mL/min (ref >60)
GFR, Estimated: >60 mL/min (ref >60)
Glucose, Bld: 96 mg/dL (ref 70–99)
Potassium: 4.5 mmol/L (ref 3.5–5.1)
Sodium: 140 mmol/L (ref 135–145)
Total Bilirubin: 0.5 mg/dL (ref 0.3–1.2)
Total Protein: 6.9 g/dL (ref 6.5–8.1)

## 2019-02-20 MED ORDER — FAMOTIDINE IN NACL 20-0.9 MG/50ML-% IV SOLN
INTRAVENOUS | Status: AC
  Filled 2019-02-20: qty 50

## 2019-02-20 MED ORDER — PALONOSETRON HCL INJECTION 0.25 MG/5ML
INTRAVENOUS | Status: AC
  Filled 2019-02-20: qty 5

## 2019-02-20 MED ORDER — DIPHENHYDRAMINE HCL 50 MG/ML IJ SOLN
50.0000 mg | Freq: Once | INTRAMUSCULAR | Status: AC
  Administered 2019-02-20: 11:00:00 50 mg via INTRAVENOUS

## 2019-02-20 MED ORDER — SODIUM CHLORIDE 0.9 % IV SOLN
45.0000 mg/m2 | Freq: Once | INTRAVENOUS | Status: AC
  Administered 2019-02-20: 12:00:00 90 mg via INTRAVENOUS
  Filled 2019-02-20: qty 15

## 2019-02-20 MED ORDER — SODIUM CHLORIDE 0.9 % IV SOLN
210.0000 mg | Freq: Once | INTRAVENOUS | Status: AC
  Administered 2019-02-20: 14:00:00 210 mg via INTRAVENOUS
  Filled 2019-02-20: qty 21

## 2019-02-20 MED ORDER — FAMOTIDINE IN NACL 20-0.9 MG/50ML-% IV SOLN
20.0000 mg | Freq: Once | INTRAVENOUS | Status: AC
  Administered 2019-02-20: 11:00:00 20 mg via INTRAVENOUS

## 2019-02-20 MED ORDER — SODIUM CHLORIDE 0.9 % IV SOLN
Freq: Once | INTRAVENOUS | Status: AC
  Filled 2019-02-20: qty 250

## 2019-02-20 MED ORDER — DIPHENHYDRAMINE HCL 50 MG/ML IJ SOLN
INTRAMUSCULAR | Status: AC
  Filled 2019-02-20: qty 1

## 2019-02-20 MED ORDER — DEXAMETHASONE SODIUM PHOSPHATE 10 MG/ML IJ SOLN
INTRAMUSCULAR | Status: AC
  Filled 2019-02-20: qty 1

## 2019-02-20 MED ORDER — PALONOSETRON HCL INJECTION 0.25 MG/5ML
0.2500 mg | Freq: Once | INTRAVENOUS | Status: AC
  Administered 2019-02-20: 0.25 mg via INTRAVENOUS

## 2019-02-20 MED ORDER — DEXAMETHASONE SODIUM PHOSPHATE 10 MG/ML IJ SOLN
10.0000 mg | Freq: Once | INTRAMUSCULAR | Status: AC
  Administered 2019-02-20: 11:00:00 10 mg via INTRAVENOUS

## 2019-02-20 NOTE — Patient Instructions (Signed)
[facility] Discharge Instructions for Patients Receiving Chemotherapy  Today you received the following chemotherapy agents Paclitaxel (TAXOL) & Carboplatin (PARAPLATIN).  To help prevent nausea and vomiting after your treatment, we encourage you to take your nausea medication as prescribed.   If you develop nausea and vomiting that is not controlled by your nausea medication, call the clinic.   BELOW ARE SYMPTOMS THAT SHOULD BE REPORTED IMMEDIATELY:  *FEVER GREATER THAN 100.5 F  *CHILLS WITH OR WITHOUT FEVER  NAUSEA AND VOMITING THAT IS NOT CONTROLLED WITH YOUR NAUSEA MEDICATION  *UNUSUAL SHORTNESS OF BREATH  *UNUSUAL BRUISING OR BLEEDING  TENDERNESS IN MOUTH AND THROAT WITH OR WITHOUT PRESENCE OF ULCERS  *URINARY PROBLEMS  *BOWEL PROBLEMS  UNUSUAL RASH Items with * indicate a potential emergency and should be followed up as soon as possible.  Feel free to call the clinic should you have any questions or concerns. The clinic phone number is [phone].  Please show the CHEMO ALERT CARD at check-in to the Emergency Department and triage nurse.  Coronavirus (COVID-19) Are you at risk?  Are you at risk for the Coronavirus (COVID-19)?  To be considered HIGH RISK for Coronavirus (COVID-19), you have to meet the following criteria:  . Traveled to [place], [place], [place], [place] or [place]; or in the [place] to [place], [place], [place], or [place]; and have fever, cough, and shortness of breath within the last 2 weeks of travel OR . Been in close contact with a person diagnosed with COVID-19 within the last 2 weeks and have fever, cough, and shortness of breath . IF YOU DO NOT MEET THESE CRITERIA, YOU ARE CONSIDERED LOW RISK FOR COVID-19.  What to do if you are HIGH RISK for COVID-19?  . If you are having a medical emergency, call 911. . Seek medical care right away. Before you go to a doctor's office, urgent care or emergency department,  call ahead and tell them about your recent travel, contact with someone diagnosed with COVID-19, and your symptoms. You should receive instructions from your physician's office regarding next steps of care.  . When you arrive at healthcare provider, tell the healthcare staff immediately you have returned from visiting [place], [place], [place], [place] or [place]; or traveled in the [place] to [place], [place], [place], or [place]; in the last two weeks or you have been in close contact with a person diagnosed with COVID-19 in the last 2 weeks.   . Tell the health care staff about your symptoms: fever, cough and shortness of breath. . After you have been seen by a medical provider, you will be either: o Tested for (COVID-19) and discharged home on quarantine except to seek medical care if symptoms worsen, and asked to  - Stay home and avoid contact with others until you get your results (4-5 days)  - Avoid travel on public transportation if possible (such as bus, train, or airplane) or o Sent to the Emergency Department by EMS for evaluation, COVID-19 testing, and possible admission depending on your condition and test results.  What to do if you are LOW RISK for COVID-19?  Reduce your risk of any infection by using the same precautions used for avoiding the common cold or flu:  . Wash your hands often with soap and warm water for at least 20 seconds.  If soap and water are not readily available, use an alcohol-based hand sanitizer with at least 60% alcohol.  .   If coughing or sneezing, cover your mouth and nose by coughing or sneezing into the elbow areas of your shirt or coat, into a tissue or into your sleeve (not your hands). . Avoid shaking hands with others and consider head nods or verbal greetings only. . Avoid touching your eyes, nose, or mouth with unwashed hands.  . Avoid close contact with people who are sick. . Avoid places or events with large numbers of people in one  location, like concerts or sporting events. . Carefully consider travel plans you have or are making. . If you are planning any travel outside or inside the [place], visit the CDC's Travelers' Health webpage for the latest health notices. . If you have some symptoms but not all symptoms, continue to monitor at home and seek medical attention if your symptoms worsen. . If you are having a medical emergency, call 911.   ADDITIONAL HEALTHCARE OPTIONS FOR PATIENTS  [facility] / e-Visit: [email or web address]         [facility]: [phone]  [facility]: [phone]                   [facility]: [phone]   

## 2019-02-21 ENCOUNTER — Ambulatory Visit
Admission: RE | Admit: 2019-02-21 | Discharge: 2019-02-21 | Disposition: A | Discharge status: Home / Self Care | Payer: No Typology Code available for payment source | Source: Ambulatory Visit | Attending: Radiation Oncology | Admitting: Radiation Oncology

## 2019-02-21 ENCOUNTER — Other Ambulatory Visit: Payer: Self-pay

## 2019-02-21 DIAGNOSIS — C3491 Malignant neoplasm of unspecified part of right bronchus or lung: Secondary | ICD-10-CM | POA: Diagnosis not present

## 2019-02-22 ENCOUNTER — Other Ambulatory Visit: Payer: Self-pay

## 2019-02-22 ENCOUNTER — Ambulatory Visit
Admission: RE | Admit: 2019-02-22 | Discharge: 2019-02-22 | Disposition: A | Discharge status: Home / Self Care | Payer: No Typology Code available for payment source | Source: Ambulatory Visit | Attending: Radiation Oncology | Admitting: Radiation Oncology

## 2019-02-22 DIAGNOSIS — C3491 Malignant neoplasm of unspecified part of right bronchus or lung: Secondary | ICD-10-CM | POA: Diagnosis not present

## 2019-02-22 LAB — ACID FAST CULTURE WITH REFLEXED SENSITIVITIES (MYCOBACTERIA): Acid Fast Culture: NEGATIVE

## 2019-02-23 ENCOUNTER — Ambulatory Visit
Admission: RE | Admit: 2019-02-23 | Discharge: 2019-02-23 | Disposition: A | Discharge status: Home / Self Care | Payer: No Typology Code available for payment source | Source: Ambulatory Visit | Attending: Radiation Oncology | Admitting: Radiation Oncology

## 2019-02-23 DIAGNOSIS — C3491 Malignant neoplasm of unspecified part of right bronchus or lung: Secondary | ICD-10-CM | POA: Diagnosis not present

## 2019-02-24 ENCOUNTER — Ambulatory Visit
Admission: RE | Admit: 2019-02-24 | Discharge: 2019-02-24 | Disposition: A | Discharge status: Home / Self Care | Payer: No Typology Code available for payment source | Source: Ambulatory Visit | Attending: Radiation Oncology | Admitting: Radiation Oncology

## 2019-02-24 ENCOUNTER — Other Ambulatory Visit: Payer: Self-pay

## 2019-02-24 DIAGNOSIS — C3491 Malignant neoplasm of unspecified part of right bronchus or lung: Secondary | ICD-10-CM | POA: Diagnosis not present

## 2019-02-26 NOTE — Progress Notes (Signed)
Philip [name]  Philip Pepper, MD [address] [place] [address]  DIAGNOSIS: Stage IIIc (T4,N3, M0)non-small cell lung cancer, adenocarcinoma presented with large right hilar mass in addition to bilateral hilar and mediastinal lymphadenopathy diagnosed in December 2020.  PRIOR THERAPY: None  CURRENT THERAPY: Weekly concurrent chemoradiation with carboplatin for an AUC of 2 and paclitaxel 45 mg/m2. First dose starting 01/30/2019. Status post 4 cycles.   INTERVAL HISTORY: Philip [name] 64 y.o. male returns to the clinic for a follow up visit. The patient is feeling well today without any concerning complaints except for some odynophagia secondary to radiation treatment.. The patient continues to tolerate concurrent chemoradiation fairly well well without any adverse effects except for he had a reaction to taxol during his treatment two weeks ago. He developed shortness of breath and back pain. He tolerated his next weeks dose well without any adverse events. Today, he denies any fever, chills, night sweats, or weight loss. Denies any chest pain or hemoptysis. He reports his baseline smokers cough and dyspnea on exertion. He occasionally experiences wheezing but he states as soon as he coughs, his wheezing stops. He states he has not needed to use his inhaler for his wheezing. 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 Denies any nausea, vomiting, or diarrhea. He had significant constipation a few weeks ago but he modified his diet to include prunes, dates, and other fruits and veggies and it improved his constipation symptoms. Denies any headache or visual changes. The last day of radiation is scheduled for 03/17/2019. The patient is here today for evaluation prior to starting cycle # 5    MEDICAL HISTORY: Past Medical History:  Diagnosis Date  . Hyperlipidemia   . Pneumonia   . Tobacco abuse      ALLERGIES:  is allergic to norco [hydrocodone-acetaminophen].  MEDICATIONS:  Current Outpatient Medications  Medication Sig Dispense Refill  . albuterol (VENTOLIN HFA) 108 (90 Base) MCG/ACT inhaler Inhale 2 puffs into the lungs every 6 (six) hours as needed for wheezing or shortness of breath. 1 g 0  . aspirin 325 MG tablet Take 650 mg by mouth daily as needed for moderate pain.    . Aspirin-Acetaminophen-Caffeine (GOODY HEADACHE PO) Take 1 packet by mouth daily as needed (headaches).    Marland Kitchen atorvastatin (LIPITOR) 20 MG tablet Take 20 mg by mouth daily.    Marland Kitchen bismuth subsalicylate (PEPTO BISMOL) 262 MG/15ML suspension Take 30 mLs by mouth every 6 (six) hours as needed for indigestion.    . prochlorperazine (COMPAZINE) 10 MG tablet Take 1 tablet (10 mg total) by mouth every 6 (six) hours as needed for nausea or vomiting. 30 tablet 2  . sildenafil (REVATIO) 20 MG tablet Take 20-60 mg by mouth daily as needed (ED).     . Tetrahydrozoline HCl (VISINE OP) Place 1 drop into both eyes daily as needed (redness).     No current facility-administered medications for this visit.   Facility-Administered Medications Ordered in Other Visits  Medication Dose Route Frequency Provider Last Rate Last Admin  . CARBOplatin (PARAPLATIN) 210 mg in sodium chloride 0.9 % 250 mL chemo infusion  210 mg Intravenous Once [name], MD      . PACLitaxel (TAXOL) 90 mg in sodium chloride 0.9 % 250 mL chemo infusion (</= 63m/m2)  45 mg/m2 (Treatment Plan Recorded) Intravenous Once MCurt Bears MD        SURGICAL  HISTORY:  Past Surgical History:  Procedure Laterality Date  . COLONOSCOPY    . HAND SURGERY    . VIDEO BRONCHOSCOPY WITH ENDOBRONCHIAL ULTRASOUND N/A 12/22/2018   Procedure: VIDEO BRONCHOSCOPY WITH ENDOBRONCHIAL ULTRASOUND;  Surgeon: Garner Nash, DO;  Location: [facility];  Service: Thoracic;  Laterality: N/A;  . VIDEO BRONCHOSCOPY WITH ENDOBRONCHIAL ULTRASOUND N/A 01/09/2019   Procedure: VIDEO  BRONCHOSCOPY WITH ENDOBRONCHIAL ULTRASOUND WITH FLUORO;  Surgeon: Garner Nash, DO;  Location: [facility];  Service: Thoracic;  Laterality: N/A;  . VIDEO BRONCHOSCOPY WITH RADIAL ENDOBRONCHIAL ULTRASOUND N/A 12/22/2018   Procedure: RADIAL ENDOBRONCHIAL ULTRASOUND;  Surgeon: Garner Nash, DO;  Location: [facility];  Service: Thoracic;  Laterality: N/A;    REVIEW OF SYSTEMS:   Review of Systems  Constitutional: Negative for appetite change, chills, fatigue, fever and unexpected weight change.  HENT: Positive for discomfort with swallowing secondary to radiation. Negative for mouth sores, nosebleeds.  Eyes: Negative for eye problems and icterus.  Respiratory: Positive for cough, occasional wheezing, and dyspnea on exertion. Negative for hemoptysis and wheezing.  Cardiovascular: Negative for chest pain and leg swelling.  Gastrointestinal: Negative for abdominal pain, constipation, diarrhea, nausea and vomiting.  Genitourinary: Negative for bladder incontinence, difficulty urinating, dysuria, frequency and hematuria.   Musculoskeletal: Negative for back pain, gait problem, neck pain and neck stiffness.  Skin: Negative for itching and rash.  Neurological: Negative for dizziness, extremity weakness, gait problem, headaches, light-headedness and seizures.  Hematological: Negative for adenopathy. Does not bruise/bleed easily.  Psychiatric/Behavioral: Negative for confusion, depression and sleep disturbance. The patient is not nervous/anxious.     PHYSICAL EXAMINATION:  Blood pressure 118/84, pulse 95, temperature 98.7 F (37.1 C), temperature source Temporal, resp. rate 18, height _0  (1.676 m), weight 182 lb 1.6 oz (82.6 kg), SpO2 97 %.  ECOG PERFORMANCE STATUS: 1 - Symptomatic but completely ambulatory  Physical Exam  Constitutional: Oriented to person, place, and time and well-developed, well-nourished, and in no distress.  HENT:  Head: Normocephalic and atraumatic.  Mouth/Throat: Oropharynx  is clear and moist. No oropharyngeal exudate.  Eyes: Conjunctivae are normal. Right eye exhibits no discharge. Left eye exhibits no discharge. No scleral icterus.  Neck: Normal range of motion. Neck supple.  Cardiovascular: Normal rate, regular rhythm, normal heart sounds and intact distal pulses.   Pulmonary/Chest: Effort normal and breath sounds normal. No respiratory distress. No wheezes. No rales.  Abdominal: Soft. Bowel sounds are normal. Exhibits no distension and no mass. There is no tenderness.  Musculoskeletal: Normal range of motion. Exhibits no edema.  Lymphadenopathy:    No cervical adenopathy.  Neurological: Alert and oriented to person, place, and time. Exhibits normal muscle tone. Gait normal. Coordination normal.  Skin: Skin is warm and dry. No rash noted. Not diaphoretic. No erythema. No pallor.  Psychiatric: Mood, memory and judgment normal.  Vitals reviewed.  LABORATORY DATA: Lab Results  Component Value Date   WBC 3.3 (L) 02/27/2019   HGB 14.0 02/27/2019   HCT 40.7 02/27/2019   MCV 92.7 02/27/2019   PLT 111 (L) 02/27/2019      Chemistry      Component Value Date/Time   NA 139 02/27/2019 0952   K 4.4 02/27/2019 0952   CL 107 02/27/2019 0952   CO2 24 02/27/2019 0952   BUN 15 02/27/2019 0952   CREATININE 0.82 02/27/2019 0952      Component Value Date/Time   CALCIUM 8.8 (L) 02/27/2019 0952   ALKPHOS 66 02/27/2019 0952   AST  13 (L) 02/27/2019 0952   ALT 15 02/27/2019 0952   BILITOT 0.5 02/27/2019 0952       RADIOGRAPHIC STUDIES:  No results found.   ASSESSMENT/PLAN:  This is a very pleasant 64 year old Caucasian male recently diagnosed with a stage IIIc (T4,N3, M0)non-small cell lung cancer, adenocarcinoma presented with large right hilar mass in addition to bilateral hilar and mediastinal lymphadenopathy diagnosed in December 2020. Molecular studies for any actionable mutations.   He is currently undergoing treatment with weekly concurrent  chemoradiation with carboplatin for an AUC of 2 and paclitaxel 45 mg/m2. He is status post 4 cycles.   Labs were reviewed. Recommend that he proceed with cycle #5 today as scheduled.   We will see him back for a follow up visit in 2 weeks for evaluation before starting cycle #7.   He will see pulmonology on 03/02/2019 as scheduled for recommendations regarding treatment for his MAC infection.   Instructed the patient to contact us or radiation oncology if he feels as though he needs Carafate for his swallowing. He said he would contact us if his symptoms worsen.   The patient was advised to call immediately if he has any concerning symptoms in the interval. The patient voices understanding of current disease status and treatment options and is in agreement with the current care plan. All questions were answered. The patient knows to call the clinic with any problems, questions or concerns. We can certainly see the patient much sooner if necessary.    No orders of the defined types were placed in this encounter.    Kaydon Creedon L Katarzyna Wolven, [name] 02/27/19

## 2019-02-27 ENCOUNTER — Ambulatory Visit
Admission: RE | Admit: 2019-02-27 | Discharge: 2019-02-27 | Disposition: A | Discharge status: Home / Self Care | Payer: No Typology Code available for payment source | Source: Ambulatory Visit | Attending: Radiation Oncology | Admitting: Radiation Oncology

## 2019-02-27 ENCOUNTER — Inpatient Hospital Stay: Payer: No Typology Code available for payment source

## 2019-02-27 ENCOUNTER — Other Ambulatory Visit: Payer: Self-pay

## 2019-02-27 ENCOUNTER — Inpatient Hospital Stay (HOSPITAL_BASED_OUTPATIENT_CLINIC_OR_DEPARTMENT_OTHER): Payer: No Typology Code available for payment source | Admitting: Physician Assistant

## 2019-02-27 ENCOUNTER — Encounter: Payer: Self-pay | Admitting: Physician Assistant

## 2019-02-27 VITALS — BP 118/84 | HR 95 | Temp 98.7°F | Resp 18 | Ht 66.0 in | Wt 182.1 lb

## 2019-02-27 DIAGNOSIS — Z5111 Encounter for antineoplastic chemotherapy: Secondary | ICD-10-CM | POA: Diagnosis not present

## 2019-02-27 DIAGNOSIS — C3491 Malignant neoplasm of unspecified part of right bronchus or lung: Secondary | ICD-10-CM

## 2019-02-27 DIAGNOSIS — R918 Other nonspecific abnormal finding of lung field: Secondary | ICD-10-CM

## 2019-02-27 LAB — CBC WITH DIFFERENTIAL (CANCER CENTER ONLY)
Abs Immature Granulocytes: 0.02 10*3/uL (ref 0.00–0.07)
Basophils Absolute: 0 10*3/uL (ref 0.0–0.1)
Basophils Relative: 1 %
Eosinophils Absolute: 0 10*3/uL (ref 0.0–0.5)
Eosinophils Relative: 1 %
HCT: 40.7 % (ref 39.0–52.0)
Hemoglobin: 14 g/dL (ref 13.0–17.0)
Immature Granulocytes: 1 %
Lymphocytes Relative: 11 %
Lymphs Abs: 0.4 10*3/uL — ABNORMAL LOW (ref 0.7–4.0)
MCH: 31.9 pg (ref 26.0–34.0)
MCHC: 34.4 g/dL (ref 30.0–36.0)
MCV: 92.7 fL (ref 80.0–100.0)
Monocytes Absolute: 0.3 10*3/uL (ref 0.1–1.0)
Monocytes Relative: 8 %
Neutro Abs: 2.6 10*3/uL (ref 1.7–7.7)
Neutrophils Relative %: 78 %
Platelet Count: 111 10*3/uL — ABNORMAL LOW (ref 150–400)
RBC: 4.39 MIL/uL (ref 4.22–5.81)
RDW: 13.2 % (ref 11.5–15.5)
WBC Count: 3.3 10*3/uL — ABNORMAL LOW (ref 4.0–10.5)
nRBC: 0 % (ref 0.0–0.2)

## 2019-02-27 LAB — CMP (CANCER CENTER ONLY)
ALT: 15 U/L (ref 0–44)
AST: 13 U/L — ABNORMAL LOW (ref 15–41)
Albumin: 3.8 g/dL (ref 3.5–5.0)
Alkaline Phosphatase: 66 U/L (ref 38–126)
Anion gap: 8 (ref 5–15)
BUN: 15 mg/dL (ref 8–23)
CO2: 24 mmol/L (ref 22–32)
Calcium: 8.8 mg/dL — ABNORMAL LOW (ref 8.9–10.3)
Chloride: 107 mmol/L (ref 98–111)
Creatinine: 0.82 mg/dL (ref 0.61–1.24)
GFR, Est AFR Am: >60 mL/min (ref >60)
GFR, Estimated: >60 mL/min (ref >60)
Glucose, Bld: 103 mg/dL — ABNORMAL HIGH (ref 70–99)
Potassium: 4.4 mmol/L (ref 3.5–5.1)
Sodium: 139 mmol/L (ref 135–145)
Total Bilirubin: 0.5 mg/dL (ref 0.3–1.2)
Total Protein: 6.8 g/dL (ref 6.5–8.1)

## 2019-02-27 MED ORDER — SODIUM CHLORIDE 0.9 % IV SOLN
210.0000 mg | Freq: Once | INTRAVENOUS | Status: AC
  Administered 2019-02-27: 15:00:00 210 mg via INTRAVENOUS
  Filled 2019-02-27: qty 21

## 2019-02-27 MED ORDER — DIPHENHYDRAMINE HCL 50 MG/ML IJ SOLN
50.0000 mg | Freq: Once | INTRAMUSCULAR | Status: AC
  Administered 2019-02-27: 12:00:00 50 mg via INTRAVENOUS

## 2019-02-27 MED ORDER — PALONOSETRON HCL INJECTION 0.25 MG/5ML
INTRAVENOUS | Status: AC
  Filled 2019-02-27: qty 5

## 2019-02-27 MED ORDER — SODIUM CHLORIDE 0.9 % IV SOLN
45.0000 mg/m2 | Freq: Once | INTRAVENOUS | Status: AC
  Administered 2019-02-27: 90 mg via INTRAVENOUS
  Filled 2019-02-27: qty 15

## 2019-02-27 MED ORDER — FAMOTIDINE IN NACL 20-0.9 MG/50ML-% IV SOLN
20.0000 mg | Freq: Once | INTRAVENOUS | Status: AC
  Administered 2019-02-27: 12:00:00 20 mg via INTRAVENOUS

## 2019-02-27 MED ORDER — FAMOTIDINE IN NACL 20-0.9 MG/50ML-% IV SOLN
INTRAVENOUS | Status: AC
  Filled 2019-02-27: qty 50

## 2019-02-27 MED ORDER — DEXAMETHASONE SODIUM PHOSPHATE 10 MG/ML IJ SOLN
INTRAMUSCULAR | Status: AC
  Filled 2019-02-27: qty 1

## 2019-02-27 MED ORDER — PALONOSETRON HCL INJECTION 0.25 MG/5ML
0.2500 mg | Freq: Once | INTRAVENOUS | Status: AC
  Administered 2019-02-27: 12:00:00 0.25 mg via INTRAVENOUS

## 2019-02-27 MED ORDER — SODIUM CHLORIDE 0.9 % IV SOLN
Freq: Once | INTRAVENOUS | Status: AC
  Filled 2019-02-27: qty 250

## 2019-02-27 MED ORDER — DIPHENHYDRAMINE HCL 50 MG/ML IJ SOLN
INTRAMUSCULAR | Status: AC
  Filled 2019-02-27: qty 1

## 2019-02-27 MED ORDER — DEXAMETHASONE SODIUM PHOSPHATE 10 MG/ML IJ SOLN
10.0000 mg | Freq: Once | INTRAMUSCULAR | Status: AC
  Administered 2019-02-27: 12:00:00 10 mg via INTRAVENOUS

## 2019-02-27 NOTE — Patient Instructions (Signed)
[facility] Discharge Instructions for Patients Receiving Chemotherapy  Today you received the following chemotherapy agents Paclitaxel (TAXOL) & Carboplatin (PARAPLATIN).  To help prevent nausea and vomiting after your treatment, we encourage you to take your nausea medication as prescribed.   If you develop nausea and vomiting that is not controlled by your nausea medication, call the clinic.   BELOW ARE SYMPTOMS THAT SHOULD BE REPORTED IMMEDIATELY:  *FEVER GREATER THAN 100.5 F  *CHILLS WITH OR WITHOUT FEVER  NAUSEA AND VOMITING THAT IS NOT CONTROLLED WITH YOUR NAUSEA MEDICATION  *UNUSUAL SHORTNESS OF BREATH  *UNUSUAL BRUISING OR BLEEDING  TENDERNESS IN MOUTH AND THROAT WITH OR WITHOUT PRESENCE OF ULCERS  *URINARY PROBLEMS  *BOWEL PROBLEMS  UNUSUAL RASH Items with * indicate a potential emergency and should be followed up as soon as possible.  Feel free to call the clinic should you have any questions or concerns. The clinic phone number is [phone].  Please show the CHEMO ALERT CARD at check-in to the Emergency Department and triage nurse.  Coronavirus (COVID-19) Are you at risk?  Are you at risk for the Coronavirus (COVID-19)?  To be considered HIGH RISK for Coronavirus (COVID-19), you have to meet the following criteria:  . Traveled to [place], [place], [place], [place] or [place]; or in the [place] to [place], [place], [place], or [place]; and have fever, cough, and shortness of breath within the last 2 weeks of travel OR . Been in close contact with a person diagnosed with COVID-19 within the last 2 weeks and have fever, cough, and shortness of breath . IF YOU DO NOT MEET THESE CRITERIA, YOU ARE CONSIDERED LOW RISK FOR COVID-19.  What to do if you are HIGH RISK for COVID-19?  . If you are having a medical emergency, call 911. . Seek medical care right away. Before you go to a doctor's office, urgent care or emergency department,  call ahead and tell them about your recent travel, contact with someone diagnosed with COVID-19, and your symptoms. You should receive instructions from your physician's office regarding next steps of care.  . When you arrive at healthcare provider, tell the healthcare staff immediately you have returned from visiting [place], [place], [place], [place] or [place]; or traveled in the [place] to [place], [place], [place], or [place]; in the last two weeks or you have been in close contact with a person diagnosed with COVID-19 in the last 2 weeks.   . Tell the health care staff about your symptoms: fever, cough and shortness of breath. . After you have been seen by a medical provider, you will be either: o Tested for (COVID-19) and discharged home on quarantine except to seek medical care if symptoms worsen, and asked to  - Stay home and avoid contact with others until you get your results (4-5 days)  - Avoid travel on public transportation if possible (such as bus, train, or airplane) or o Sent to the Emergency Department by EMS for evaluation, COVID-19 testing, and possible admission depending on your condition and test results.  What to do if you are LOW RISK for COVID-19?  Reduce your risk of any infection by using the same precautions used for avoiding the common cold or flu:  . Wash your hands often with soap and warm water for at least 20 seconds.  If soap and water are not readily available, use an alcohol-based hand sanitizer with at least 60% alcohol.  .   If coughing or sneezing, cover your mouth and nose by coughing or sneezing into the elbow areas of your shirt or coat, into a tissue or into your sleeve (not your hands). . Avoid shaking hands with others and consider head nods or verbal greetings only. . Avoid touching your eyes, nose, or mouth with unwashed hands.  . Avoid close contact with people who are sick. . Avoid places or events with large numbers of people in one  location, like concerts or sporting events. . Carefully consider travel plans you have or are making. . If you are planning any travel outside or inside the [place], visit the CDC's Travelers' Health webpage for the latest health notices. . If you have some symptoms but not all symptoms, continue to monitor at home and seek medical attention if your symptoms worsen. . If you are having a medical emergency, call 911.   ADDITIONAL HEALTHCARE OPTIONS FOR PATIENTS  [facility] / e-Visit: [email or web address]         [facility]: [phone]  [facility]: [phone]                   [facility]: [phone]   

## 2019-02-28 ENCOUNTER — Other Ambulatory Visit: Payer: Self-pay | Admitting: Radiation Oncology

## 2019-02-28 ENCOUNTER — Ambulatory Visit
Admission: RE | Admit: 2019-02-28 | Discharge: 2019-02-28 | Disposition: A | Discharge status: Home / Self Care | Payer: No Typology Code available for payment source | Source: Ambulatory Visit | Attending: Radiation Oncology | Admitting: Radiation Oncology

## 2019-02-28 ENCOUNTER — Other Ambulatory Visit: Payer: Self-pay

## 2019-02-28 DIAGNOSIS — C3491 Malignant neoplasm of unspecified part of right bronchus or lung: Secondary | ICD-10-CM | POA: Diagnosis not present

## 2019-02-28 LAB — GUARDANT 360

## 2019-02-28 MED ORDER — SUCRALFATE 1 G PO TABS: 1.0000 g | ORAL_TABLET | Freq: Three times a day (TID) | ORAL | 1 refills | Status: DC

## 2019-03-01 ENCOUNTER — Other Ambulatory Visit: Payer: Self-pay

## 2019-03-01 ENCOUNTER — Ambulatory Visit
Admission: RE | Admit: 2019-03-01 | Discharge: 2019-03-01 | Disposition: A | Discharge status: Home / Self Care | Payer: No Typology Code available for payment source | Source: Ambulatory Visit | Attending: Radiation Oncology | Admitting: Radiation Oncology

## 2019-03-01 ENCOUNTER — Telehealth: Payer: Self-pay | Admitting: Medical Oncology

## 2019-03-01 DIAGNOSIS — C3491 Malignant neoplasm of unspecified part of right bronchus or lung: Secondary | ICD-10-CM | POA: Diagnosis not present

## 2019-03-01 LAB — AFB ORGANISM ID BY DNA PROBE
M avium complex: POSITIVE — AB
M tuberculosis complex: NEGATIVE

## 2019-03-01 LAB — MAC SUSCEPTIBILITY BROTH
Clarithromycin: 2
Moxifloxacin: 4
Streptomycin: 64

## 2019-03-01 LAB — ACID FAST CULTURE WITH REFLEXED SENSITIVITIES (MYCOBACTERIA): Acid Fast Culture: POSITIVE — AB

## 2019-03-01 NOTE — Telephone Encounter (Signed)
Carbo,taxol, aloxi authorized.

## 2019-03-02 ENCOUNTER — Other Ambulatory Visit: Payer: Self-pay

## 2019-03-02 ENCOUNTER — Ambulatory Visit
Admission: RE | Admit: 2019-03-02 | Discharge: 2019-03-02 | Disposition: A | Discharge status: Home / Self Care | Payer: No Typology Code available for payment source | Source: Ambulatory Visit | Attending: Radiation Oncology | Admitting: Radiation Oncology

## 2019-03-02 ENCOUNTER — Encounter: Payer: Self-pay | Admitting: Critical Care Medicine

## 2019-03-02 ENCOUNTER — Ambulatory Visit (INDEPENDENT_AMBULATORY_CARE_PROVIDER_SITE_OTHER): Payer: No Typology Code available for payment source | Admitting: Critical Care Medicine

## 2019-03-02 VITALS — BP 92/70 | HR 97 | Temp 97.7°F | Ht 66.0 in | Wt 185.6 lb

## 2019-03-02 DIAGNOSIS — A310 Pulmonary mycobacterial infection: Secondary | ICD-10-CM

## 2019-03-02 DIAGNOSIS — Z720 Tobacco use: Secondary | ICD-10-CM | POA: Diagnosis not present

## 2019-03-02 DIAGNOSIS — J449 Chronic obstructive pulmonary disease, unspecified: Secondary | ICD-10-CM | POA: Diagnosis not present

## 2019-03-02 DIAGNOSIS — C3491 Malignant neoplasm of unspecified part of right bronchus or lung: Secondary | ICD-10-CM

## 2019-03-02 MED ORDER — SPIRIVA RESPIMAT 1.25 MCG/ACT IN AERS: 1.0000 | INHALATION_SPRAY | Freq: Every day | RESPIRATORY_TRACT | 11 refills | Status: DC

## 2019-03-02 MED ORDER — ALBUTEROL SULFATE HFA 108 (90 BASE) MCG/ACT IN AERS: 2.0000 | INHALATION_SPRAY | RESPIRATORY_TRACT | 6 refills | Status: DC | PRN

## 2019-03-02 MED ORDER — SPIRIVA RESPIMAT 1.25 MCG/ACT IN AERS: 2.0000 | INHALATION_SPRAY | Freq: Every day | RESPIRATORY_TRACT | 0 refills | Status: DC

## 2019-03-02 NOTE — Progress Notes (Signed)
Synopsis: Referred in January 2020 for pulmonary MAC by [name], MD.  Previously seen by Dr. Adair Laundry.  Previously seen by Dr. Valeta Harms.  Subjective:   PATIENT ID: Philip [name] GENDER: male DOB: 12/13/1955, MRN: [id number]  Chief Complaint  Patient presents with  . Consult    Patient is here for follow up per Dr. Valeta Harms. Patient had several images done in December. Patient has lung mass. Patient states that he is tired, weak, SOB, feels like his throat is swollen so it is hard to eat. Patient also has a productive cough with white sputum.     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 He has stage IIIc lung adenocarcinoma (T4N3M0), which is currently receiving carboplatin-paclitaxel weekly (2 more weeks to go) and radiation (early January through 2/12). He has smoked 2 ppd x 5years, and has cut down to 0.5ppd recently. He has been exposed to multiple occupational inhalation exposures without a respirator- wood dust, car paint, welding & metal grinding, pottery. 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 He has chronic tinnitus. He has never been evaluated by audiology. He has ben tolerating his chemo well with fatigue being his main complaint.   He has had a chronic cough with a productive cough for several years. He has occasional wheezing when he has sputum that he needs to clear. He has tried albuterol a few times since it was prescribed recently. He is active and has no DOE.        Past Medical History:  Diagnosis Date  . Hyperlipidemia   . Pneumonia   . Tobacco abuse      Family History  Problem Relation Age of Onset  . Cancer Father   . Stomach cancer Father      Past Surgical History:  Procedure Laterality Date  . COLONOSCOPY    . HAND SURGERY    . VIDEO BRONCHOSCOPY WITH  ENDOBRONCHIAL ULTRASOUND N/A 12/22/2018   Procedure: VIDEO BRONCHOSCOPY WITH ENDOBRONCHIAL ULTRASOUND;  Surgeon: Garner Nash, DO;  Location: [facility];  Service: Thoracic;  Laterality: N/A;  . VIDEO BRONCHOSCOPY WITH ENDOBRONCHIAL ULTRASOUND N/A 01/09/2019   Procedure: VIDEO BRONCHOSCOPY WITH ENDOBRONCHIAL ULTRASOUND WITH FLUORO;  Surgeon: Garner Nash, DO;  Location: [facility];  Service: Thoracic;  Laterality: N/A;  . VIDEO BRONCHOSCOPY WITH RADIAL ENDOBRONCHIAL ULTRASOUND N/A 12/22/2018   Procedure: RADIAL ENDOBRONCHIAL ULTRASOUND;  Surgeon: Garner Nash, DO;  Location: [facility];  Service: Thoracic;  Laterality: N/A;    Social History   Socioeconomic History  . Marital status: Married    Spouse name: Not on file  . Number of children: Not on file  . Years of education: Not on file  . Highest education level: Not on file  Occupational History  . Not on file  Tobacco Use  . Smoking status: Current Some Day Smoker    Packs/day: 0.50    Years: 45.00    Pack years: 22.50    Types: Cigarettes    Start date: 07/04/1970  . Smokeless tobacco: Former Systems developer    Types: Chew  Substance and Sexual Activity  . Alcohol use: Yes    Comment: drinks 1/2 5th every weekend  . Drug use: Not Currently    Types: Marijuana    Comment: none for 20 years  . Sexual  activity: Not on file  Other Topics Concern  . Not on file  Social History Narrative  . Not on file   Social Determinants of Health   Financial Resource Strain:   . Difficulty of Paying Living Expenses: Not on file  Food Insecurity:   . Worried About Charity fundraiser in the Last Year: Not on file  . Ran Out of Food in the Last Year: Not on file  Transportation Needs:   . Lack of Transportation (Medical): Not on file  . Lack of Transportation (Non-Medical): Not on file  Physical Activity:   . Days of Exercise per Week: Not on file  . Minutes of Exercise per Session: Not on file  Stress:   . Feeling of Stress : Not on file   Social Connections:   . Frequency of Communication with Friends and Family: Not on file  . Frequency of Social Gatherings with Friends and Family: Not on file  . Attends Religious Services: Not on file  . Active Member of Clubs or Organizations: Not on file  . Attends Archivist Meetings: Not on file  . Marital Status: Not on file  Intimate Partner Violence:   . Fear of Current or Ex-Partner: Not on file  . Emotionally Abused: Not on file  . Physically Abused: Not on file  . Sexually Abused: Not on file     Allergies  Allergen Reactions  . Norco [Hydrocodone-Acetaminophen] Other (See Comments)    Made pt feel jittery      Immunization History  Administered Date(s) Administered  . Influenza,inj,Quad PF,6+ Mos 11/08/2018  . Td 05/18/2014    Outpatient Medications Prior to Visit  Medication Sig Dispense Refill  . albuterol (VENTOLIN HFA) 108 (90 Base) MCG/ACT inhaler Inhale 2 puffs into the lungs every 6 (six) hours as needed for wheezing or shortness of breath. 1 g 0  . aspirin 325 MG tablet Take 650 mg by mouth daily as needed for moderate pain.    . Aspirin-Acetaminophen-Caffeine (GOODY HEADACHE PO) Take 1 packet by mouth daily as needed (headaches).    Marland Kitchen atorvastatin (LIPITOR) 20 MG tablet Take 20 mg by mouth daily.    Marland Kitchen bismuth subsalicylate (PEPTO BISMOL) 262 MG/15ML suspension Take 30 mLs by mouth every 6 (six) hours as needed for indigestion.    . prochlorperazine (COMPAZINE) 10 MG tablet Take 1 tablet (10 mg total) by mouth every 6 (six) hours as needed for nausea or vomiting. 30 tablet 2  . sucralfate (CARAFATE) 1 g tablet Take 1 tablet (1 g total) by mouth 4 (four) times daily -  with meals and at bedtime. Dissolve in 10 mL of warm water prior to swallowing 120 tablet 1  . Tetrahydrozoline HCl (VISINE OP) Place 1 drop into both eyes daily as needed (redness).    . sildenafil (REVATIO) 20 MG tablet Take 20-60 mg by mouth daily as needed (ED).      No  facility-administered medications prior to visit.    Review of Systems  Constitutional: Positive for malaise/fatigue. Negative for chills and fever.  HENT: Positive for hearing loss.   Respiratory: Positive for cough and sputum production. Negative for wheezing.   Cardiovascular: Negative for chest pain and leg swelling.     Objective:   Vitals:   03/02/19 1410  BP: 92/70  Pulse: 97  Temp: 97.7 F (36.5 C)  TempSrc: Temporal  SpO2: 98%  Weight: 185 lb 9.6 oz (84.2 kg)  Height: _0  (1.676 m)  98% on   RA BMI Readings from Last 3 Encounters:  03/02/19 29.96 kg/m  02/27/19 29.39 kg/m  02/20/19 29.13 kg/m   Wt Readings from Last 3 Encounters:  03/02/19 185 lb 9.6 oz (84.2 kg)  02/27/19 182 lb 1.6 oz (82.6 kg)  02/20/19 180 lb 8 oz (81.9 kg)    Physical Exam Vitals reviewed.  Constitutional:      Appearance: He is not ill-appearing.  HENT:     Head: Normocephalic and atraumatic.     Nose:     Comments: Deferred due to masking requirement.    Mouth/Throat:     Comments: Deferred due to masking requirement. Eyes:     General: No scleral icterus. Cardiovascular:     Rate and Rhythm: Normal rate and regular rhythm.     Heart sounds: No murmur.  Pulmonary:     Comments: Breathing comfortably on room air, no conversational dyspnea.  Clear to auscultation bilaterally. Abdominal:     General: There is no distension.     Palpations: Abdomen is soft.     Tenderness: There is no abdominal tenderness.  Musculoskeletal:        General: No swelling or deformity.     Cervical back: Neck supple.  Lymphadenopathy:     Cervical: No cervical adenopathy.  Skin:    General: Skin is warm and dry.     Findings: No rash.  Neurological:     Mental Status: He is alert.  Psychiatric:        Mood and Affect: Mood normal.        Behavior: Behavior normal.      CBC    Component Value Date/Time   WBC 3.3 (L) 02/27/2019 0952   WBC 7.2 01/09/2019 0716   RBC 4.39  02/27/2019 0952   HGB 14.0 02/27/2019 0952   HCT 40.7 02/27/2019 0952   PLT 111 (L) 02/27/2019 0952   MCV 92.7 02/27/2019 0952   MCH 31.9 02/27/2019 0952   MCHC 34.4 02/27/2019 0952   RDW 13.2 02/27/2019 0952   LYMPHSABS 0.4 (L) 02/27/2019 0952   MONOABS 0.3 02/27/2019 0952   EOSABS 0.0 02/27/2019 0952   BASOSABS 0.0 02/27/2019 0952    CHEMISTRY Recent Labs  Lab 02/27/19 0952  NA 139  K 4.4  CL 107  CO2 24  GLUCOSE 103*  BUN 15  CREATININE 0.82  CALCIUM 8.8*   Estimated Creatinine Clearance: 93.9 mL/min (by C-G formula based on SCr of 0.82 mg/dL).   Chest Imaging- films reviewed: CT PET 01/03/2019-severe emphysema, right hilar mass with air bronchograms, medial RLL cystic fibrotic changes.  PET avidity of the inferior portion of the mass and medial fibrotic areas of the lower lobe.   Micro: 01/09/2019 AFB: Component 1 mo ago  Organism ID [id number]    Comment: Mycobacterium avium complex  Amikacin Comment   Comment: 16.0 ug/mL Susceptible  Clarithromycin 2.0 ug/mL Susceptible   Linezolid Comment   Comment: 16.0 ug/mL Intermediate  Moxifloxacin 4.0 ug/mL Resistant   Streptomycin 64.0 ug/mL    01/09/2019 fungus culture-negative 01/09/2019 BAL-many PMN, negative culture  Pulmonary Functions Testing Results: PFT Results Latest Ref Rng & Units 12/21/2018  FVC-Pre L 3.07  FVC-Predicted Pre % 76  FVC-Post L 3.23  FVC-Predicted Post % 80  Pre FEV1/FVC % % 67  Post FEV1/FCV % % 71  FEV1-Pre L 2.07  FEV1-Predicted Pre % 68  FEV1-Post L 2.28  DLCO UNC% % 64  DLCO COR %Predicted % 79  TLC L  5.35  TLC % Predicted % 86  RV % Predicted % 104   2020- mild obstruction without significant bronchodilator reversibility.  No significant restriction, air trapping, or hyperinflation.  Mildly reduced diffusion.  Flow volume loop supports obstruction.  Pathology 01/09/2019: RLL adenocarcinoma November 2020 bronc nondiagnostic     Assessment & Plan:     ICD-10-CM    1. Pulmonary MAI (mycobacterium avium-intracellulare) infection ([facility])  A31.0 Ambulatory referral to Audiology  2. Primary lung adenocarcinoma, right ([facility])  C34.91   3. Chronic obstructive pulmonary disease, unspecified COPD type ([facility])  J44.9 Tiotropium Bromide Monohydrate (SPIRIVA RESPIMAT) 1.25 MCG/ACT AERS    albuterol (VENTOLIN HFA) 108 (90 Base) MCG/ACT inhaler  4. Tobacco abuse  Z72.0      Lung adenocarcinoma- stage IIIc (T4N3M0) -planning for completion of current regimen of chemo-RT. This has to be the priority of his care currently.  COPD- partial BD reversibility. Ongoing tobacco abuse. 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 Currently mild leukopenia without severe immunodeficiency based on WBC. Resistant to quinolones; intermediate susceptibility to linezolid. -recommend starting 3-drug therapy daily; of concern is his baseline hearing impairment. My concern with starting concurrently and this close to the end of his chemo regimen is side effects that could limit therapy -monthly sputum cultures once on therapy until negative cultures to determine duration of needed therapy -PLEASE AVOID MONOTHERAPY WITH MACROLIDES DUE TO RISK OF INDUCIBLE RESISTANCE OF MAI  Chronic hearing loss, tinnitus -Audiology referral to establish baseline prior to initiation of MAI antibiotics   RTC in 1 month.   Current Outpatient Medications:  .  albuterol (VENTOLIN HFA) 108 (90 Base) MCG/ACT inhaler, Inhale 2 puffs into the lungs every 6 (six) hours as needed for wheezing or shortness of breath., Disp: 1 g, Rfl: 0 .  aspirin 325 MG tablet, Take 650 mg by mouth daily as needed for moderate pain., Disp: , Rfl:  .  Aspirin-Acetaminophen-Caffeine (GOODY HEADACHE PO),  Take 1 packet by mouth daily as needed (headaches)., Disp: , Rfl:  .  atorvastatin (LIPITOR) 20 MG tablet, Take 20 mg by mouth daily., Disp: , Rfl:  .  bismuth subsalicylate (PEPTO BISMOL) 262 MG/15ML suspension, Take 30 mLs by mouth every 6 (six) hours as needed for indigestion., Disp: , Rfl:  .  prochlorperazine (COMPAZINE) 10 MG tablet, Take 1 tablet (10 mg total) by mouth every 6 (six) hours as needed for nausea or vomiting., Disp: 30 tablet, Rfl: 2 .  sucralfate (CARAFATE) 1 g tablet, Take 1 tablet (1 g total) by mouth 4 (four) times daily -  with meals and at bedtime. Dissolve in 10 mL of warm water prior to swallowing, Disp: 120 tablet, Rfl: 1 .  Tetrahydrozoline HCl (VISINE OP), Place 1 drop into both eyes daily as needed (redness)., Disp: , Rfl:  .  sildenafil (REVATIO) 20 MG tablet, Take 20-60 mg by mouth daily as needed (ED). , Disp: , Rfl:     [name], DO [facility] 03/02/2019 2:38 PM

## 2019-03-02 NOTE — Patient Instructions (Addendum)
Thank you for visiting Dr. Carlis Abbott at [facility]. We recommend the following: Orders Placed This Encounter  Procedures  . Ambulatory referral to Audiology   Orders Placed This Encounter  Procedures  . Ambulatory referral to Audiology    Referral Priority:   Routine    Referral Type:   Audiology Exam    Referral Reason:   Specialty Services Required    Number of Visits Requested:   1    Meds ordered this encounter  Medications  . Tiotropium Bromide Monohydrate (SPIRIVA RESPIMAT) 1.25 MCG/ACT AERS    Sig: Inhale 1 puff into the lungs daily.    Dispense:  4 g    Refill:  11  . albuterol (VENTOLIN HFA) 108 (90 Base) MCG/ACT inhaler    Sig: Inhale 2 puffs into the lungs every 4 (four) hours as needed for wheezing or shortness of breath.    Dispense:  18 g    Refill:  6    Return in about 4 weeks (around 03/30/2019).    Please do your part to reduce the spread of COVID-19.

## 2019-03-03 ENCOUNTER — Other Ambulatory Visit: Payer: Self-pay

## 2019-03-03 ENCOUNTER — Ambulatory Visit
Admission: RE | Admit: 2019-03-03 | Discharge: 2019-03-03 | Disposition: A | Discharge status: Home / Self Care | Payer: No Typology Code available for payment source | Source: Ambulatory Visit | Attending: Radiation Oncology | Admitting: Radiation Oncology

## 2019-03-03 DIAGNOSIS — C3491 Malignant neoplasm of unspecified part of right bronchus or lung: Secondary | ICD-10-CM | POA: Diagnosis not present

## 2019-03-06 ENCOUNTER — Inpatient Hospital Stay: Payer: No Typology Code available for payment source

## 2019-03-06 ENCOUNTER — Ambulatory Visit
Admission: RE | Admit: 2019-03-06 | Discharge: 2019-03-06 | Disposition: A | Discharge status: Home / Self Care | Payer: No Typology Code available for payment source | Source: Ambulatory Visit | Attending: Radiation Oncology | Admitting: Radiation Oncology

## 2019-03-06 ENCOUNTER — Other Ambulatory Visit: Payer: Self-pay

## 2019-03-06 VITALS — BP 105/78 | HR 108 | Temp 98.7°F | Resp 20

## 2019-03-06 DIAGNOSIS — Z510 Encounter for antineoplastic radiation therapy: Secondary | ICD-10-CM | POA: Diagnosis not present

## 2019-03-06 DIAGNOSIS — Z5111 Encounter for antineoplastic chemotherapy: Secondary | ICD-10-CM | POA: Insufficient documentation

## 2019-03-06 DIAGNOSIS — R918 Other nonspecific abnormal finding of lung field: Secondary | ICD-10-CM

## 2019-03-06 DIAGNOSIS — E785 Hyperlipidemia, unspecified: Secondary | ICD-10-CM | POA: Insufficient documentation

## 2019-03-06 DIAGNOSIS — Z7982 Long term (current) use of aspirin: Secondary | ICD-10-CM | POA: Diagnosis not present

## 2019-03-06 DIAGNOSIS — C3491 Malignant neoplasm of unspecified part of right bronchus or lung: Secondary | ICD-10-CM

## 2019-03-06 DIAGNOSIS — F17210 Nicotine dependence, cigarettes, uncomplicated: Secondary | ICD-10-CM | POA: Insufficient documentation

## 2019-03-06 DIAGNOSIS — Z79899 Other long term (current) drug therapy: Secondary | ICD-10-CM | POA: Insufficient documentation

## 2019-03-06 DIAGNOSIS — K5900 Constipation, unspecified: Secondary | ICD-10-CM | POA: Diagnosis not present

## 2019-03-06 DIAGNOSIS — C3411 Malignant neoplasm of upper lobe, right bronchus or lung: Secondary | ICD-10-CM | POA: Insufficient documentation

## 2019-03-06 LAB — CMP (CANCER CENTER ONLY)
ALT: 20 U/L (ref 0–44)
AST: 16 U/L (ref 15–41)
Albumin: 3.8 g/dL (ref 3.5–5.0)
Alkaline Phosphatase: 67 U/L (ref 38–126)
Anion gap: 7 (ref 5–15)
BUN: 19 mg/dL (ref 8–23)
CO2: 26 mmol/L (ref 22–32)
Calcium: 9.3 mg/dL (ref 8.9–10.3)
Chloride: 107 mmol/L (ref 98–111)
Creatinine: 0.96 mg/dL (ref 0.61–1.24)
GFR, Est AFR Am: >60 mL/min (ref >60)
GFR, Estimated: >60 mL/min (ref >60)
Glucose, Bld: 110 mg/dL — ABNORMAL HIGH (ref 70–99)
Potassium: 4.6 mmol/L (ref 3.5–5.1)
Sodium: 140 mmol/L (ref 135–145)
Total Bilirubin: 0.4 mg/dL (ref 0.3–1.2)
Total Protein: 7 g/dL (ref 6.5–8.1)

## 2019-03-06 LAB — CBC WITH DIFFERENTIAL (CANCER CENTER ONLY)
Abs Immature Granulocytes: 0.01 10*3/uL (ref 0.00–0.07)
Basophils Absolute: 0 10*3/uL (ref 0.0–0.1)
Basophils Relative: 1 %
Eosinophils Absolute: 0 10*3/uL (ref 0.0–0.5)
Eosinophils Relative: 2 %
HCT: 41.5 % (ref 39.0–52.0)
Hemoglobin: 14.1 g/dL (ref 13.0–17.0)
Immature Granulocytes: 0 %
Lymphocytes Relative: 11 %
Lymphs Abs: 0.3 10*3/uL — ABNORMAL LOW (ref 0.7–4.0)
MCH: 31.7 pg (ref 26.0–34.0)
MCHC: 34 g/dL (ref 30.0–36.0)
MCV: 93.3 fL (ref 80.0–100.0)
Monocytes Absolute: 0.2 10*3/uL (ref 0.1–1.0)
Monocytes Relative: 8 %
Neutro Abs: 2 10*3/uL (ref 1.7–7.7)
Neutrophils Relative %: 78 %
Platelet Count: 109 10*3/uL — ABNORMAL LOW (ref 150–400)
RBC: 4.45 MIL/uL (ref 4.22–5.81)
RDW: 13.3 % (ref 11.5–15.5)
WBC Count: 2.5 10*3/uL — ABNORMAL LOW (ref 4.0–10.5)
nRBC: 0 % (ref 0.0–0.2)

## 2019-03-06 MED ORDER — SODIUM CHLORIDE 0.9 % IV SOLN
Freq: Once | INTRAVENOUS | Status: AC
  Filled 2019-03-06: qty 250

## 2019-03-06 MED ORDER — DIPHENHYDRAMINE HCL 50 MG/ML IJ SOLN
INTRAMUSCULAR | Status: AC
  Filled 2019-03-06: qty 1

## 2019-03-06 MED ORDER — SODIUM CHLORIDE 0.9 % IV SOLN
210.0000 mg | Freq: Once | INTRAVENOUS | Status: AC
  Administered 2019-03-06: 210 mg via INTRAVENOUS
  Filled 2019-03-06: qty 21

## 2019-03-06 MED ORDER — DEXAMETHASONE SODIUM PHOSPHATE 10 MG/ML IJ SOLN
INTRAMUSCULAR | Status: AC
  Filled 2019-03-06: qty 1

## 2019-03-06 MED ORDER — DIPHENHYDRAMINE HCL 50 MG/ML IJ SOLN
50.0000 mg | Freq: Once | INTRAMUSCULAR | Status: AC
  Administered 2019-03-06: 50 mg via INTRAVENOUS

## 2019-03-06 MED ORDER — FAMOTIDINE IN NACL 20-0.9 MG/50ML-% IV SOLN
INTRAVENOUS | Status: AC
  Filled 2019-03-06: qty 50

## 2019-03-06 MED ORDER — DEXAMETHASONE SODIUM PHOSPHATE 10 MG/ML IJ SOLN
10.0000 mg | Freq: Once | INTRAMUSCULAR | Status: AC
  Administered 2019-03-06: 11:00:00 10 mg via INTRAVENOUS

## 2019-03-06 MED ORDER — PALONOSETRON HCL INJECTION 0.25 MG/5ML
0.2500 mg | Freq: Once | INTRAVENOUS | Status: AC
  Administered 2019-03-06: 0.25 mg via INTRAVENOUS

## 2019-03-06 MED ORDER — PALONOSETRON HCL INJECTION 0.25 MG/5ML
INTRAVENOUS | Status: AC
  Filled 2019-03-06: qty 5

## 2019-03-06 MED ORDER — FAMOTIDINE IN NACL 20-0.9 MG/50ML-% IV SOLN
20.0000 mg | Freq: Once | INTRAVENOUS | Status: AC
  Administered 2019-03-06: 20 mg via INTRAVENOUS

## 2019-03-06 MED ORDER — SODIUM CHLORIDE 0.9 % IV SOLN
45.0000 mg/m2 | Freq: Once | INTRAVENOUS | Status: AC
  Administered 2019-03-06: 90 mg via INTRAVENOUS
  Filled 2019-03-06: qty 15

## 2019-03-06 NOTE — Patient Instructions (Signed)
[facility] Discharge Instructions for Patients Receiving Chemotherapy  Today you received the following chemotherapy agents Paclitaxel (TAXOL) & Carboplatin (PARAPLATIN).  To help prevent nausea and vomiting after your treatment, we encourage you to take your nausea medication as prescribed.   If you develop nausea and vomiting that is not controlled by your nausea medication, call the clinic.   BELOW ARE SYMPTOMS THAT SHOULD BE REPORTED IMMEDIATELY:  *FEVER GREATER THAN 100.5 F  *CHILLS WITH OR WITHOUT FEVER  NAUSEA AND VOMITING THAT IS NOT CONTROLLED WITH YOUR NAUSEA MEDICATION  *UNUSUAL SHORTNESS OF BREATH  *UNUSUAL BRUISING OR BLEEDING  TENDERNESS IN MOUTH AND THROAT WITH OR WITHOUT PRESENCE OF ULCERS  *URINARY PROBLEMS  *BOWEL PROBLEMS  UNUSUAL RASH Items with * indicate a potential emergency and should be followed up as soon as possible.  Feel free to call the clinic should you have any questions or concerns. The clinic phone number is [phone].  Please show the CHEMO ALERT CARD at check-in to the Emergency Department and triage nurse.  Coronavirus (COVID-19) Are you at risk?  Are you at risk for the Coronavirus (COVID-19)?  To be considered HIGH RISK for Coronavirus (COVID-19), you have to meet the following criteria:  . Traveled to [place], [place], [place], [place] or [place]; or in the [place] to [place], [place], [place], or [place]; and have fever, cough, and shortness of breath within the last 2 weeks of travel OR . Been in close contact with a person diagnosed with COVID-19 within the last 2 weeks and have fever, cough, and shortness of breath . IF YOU DO NOT MEET THESE CRITERIA, YOU ARE CONSIDERED LOW RISK FOR COVID-19.  What to do if you are HIGH RISK for COVID-19?  . If you are having a medical emergency, call 911. . Seek medical care right away. Before you go to a doctor's office, urgent care or emergency department,  call ahead and tell them about your recent travel, contact with someone diagnosed with COVID-19, and your symptoms. You should receive instructions from your physician's office regarding next steps of care.  . When you arrive at healthcare provider, tell the healthcare staff immediately you have returned from visiting [place], [place], [place], [place] or [place]; or traveled in the [place] to [place], [place], [place], or [place]; in the last two weeks or you have been in close contact with a person diagnosed with COVID-19 in the last 2 weeks.   . Tell the health care staff about your symptoms: fever, cough and shortness of breath. . After you have been seen by a medical provider, you will be either: o Tested for (COVID-19) and discharged home on quarantine except to seek medical care if symptoms worsen, and asked to  - Stay home and avoid contact with others until you get your results (4-5 days)  - Avoid travel on public transportation if possible (such as bus, train, or airplane) or o Sent to the Emergency Department by EMS for evaluation, COVID-19 testing, and possible admission depending on your condition and test results.  What to do if you are LOW RISK for COVID-19?  Reduce your risk of any infection by using the same precautions used for avoiding the common cold or flu:  . Wash your hands often with soap and warm water for at least 20 seconds.  If soap and water are not readily available, use an alcohol-based hand sanitizer with at least 60% alcohol.  .   If coughing or sneezing, cover your mouth and nose by coughing or sneezing into the elbow areas of your shirt or coat, into a tissue or into your sleeve (not your hands). . Avoid shaking hands with others and consider head nods or verbal greetings only. . Avoid touching your eyes, nose, or mouth with unwashed hands.  . Avoid close contact with people who are sick. . Avoid places or events with large numbers of people in one  location, like concerts or sporting events. . Carefully consider travel plans you have or are making. . If you are planning any travel outside or inside the [place], visit the CDC's Travelers' Health webpage for the latest health notices. . If you have some symptoms but not all symptoms, continue to monitor at home and seek medical attention if your symptoms worsen. . If you are having a medical emergency, call 911.   ADDITIONAL HEALTHCARE OPTIONS FOR PATIENTS  [facility] / e-Visit: [email or web address]         [facility]: [phone]   Urgent Care: [phone]                   [facility]: [phone]   

## 2019-03-06 NOTE — Progress Notes (Signed)
Per Dr. Julien Nordmann patient Ok to treat with HR 108

## 2019-03-07 ENCOUNTER — Ambulatory Visit
Admission: RE | Admit: 2019-03-07 | Discharge: 2019-03-07 | Disposition: A | Discharge status: Home / Self Care | Payer: No Typology Code available for payment source | Source: Ambulatory Visit | Attending: Radiation Oncology | Admitting: Radiation Oncology

## 2019-03-07 ENCOUNTER — Other Ambulatory Visit: Payer: Self-pay

## 2019-03-07 DIAGNOSIS — C3491 Malignant neoplasm of unspecified part of right bronchus or lung: Secondary | ICD-10-CM | POA: Diagnosis not present

## 2019-03-08 ENCOUNTER — Encounter: Payer: Self-pay | Admitting: Radiation Oncology

## 2019-03-08 ENCOUNTER — Ambulatory Visit: Payer: No Typology Code available for payment source

## 2019-03-08 ENCOUNTER — Other Ambulatory Visit: Payer: Self-pay

## 2019-03-08 DIAGNOSIS — C3491 Malignant neoplasm of unspecified part of right bronchus or lung: Secondary | ICD-10-CM | POA: Diagnosis not present

## 2019-03-09 ENCOUNTER — Ambulatory Visit: Payer: No Typology Code available for payment source

## 2019-03-10 ENCOUNTER — Telehealth: Payer: Self-pay | Admitting: Internal Medicine

## 2019-03-10 ENCOUNTER — Ambulatory Visit: Payer: No Typology Code available for payment source

## 2019-03-10 ENCOUNTER — Other Ambulatory Visit: Payer: Self-pay | Admitting: *Deleted

## 2019-03-10 DIAGNOSIS — C3491 Malignant neoplasm of unspecified part of right bronchus or lung: Secondary | ICD-10-CM

## 2019-03-10 NOTE — Telephone Encounter (Signed)
Called and spoke with patient. Confirmed 2/8 appt

## 2019-03-12 NOTE — Progress Notes (Signed)
[facility] OFFICE PROGRESS NOTE  [name], MD [address] [place] [address]  DIAGNOSIS: Stage IIIc (T4,N3, M0)non-small cell lung cancer, adenocarcinoma presented with large right hilar mass in addition to bilateral hilar and mediastinal lymphadenopathy diagnosed in December 2020.  PRIOR THERAPY: Weekly concurrent chemoradiation with carboplatin for an AUC of 2 and paclitaxel 45 mg/m2. Last dose of chemotherapy on 03/06/2019.Status post 6 cycles.   CURRENT THERAPY: None  INTERVAL HISTORY: Philip [name] 64 y.o. male returns to the clinic for a follow-up visit.  The patient was initially supposed to complete his radiation treatment on 03/17/2019; however, this was discontinued early and his last treatment was on 03/08/2019.  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  He has a prescription for Carafate at home.  He states that he lost approximately 4 to 5 pounds since his last appointment and that he has not been drinking as much fluids secondary to the pain.  He states that he also experienced nausea/dry heaving yesterday for which he took one of his antiemetic pills.  He said that his pain is also exacerbated by laying down and he is taking Prilosec.   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  He is going to follow-up with them later this month to discuss initiating antibiotic therapy.  He denies any recent fever, chills, or night sweats.  He endorses his baseline smoker's cough and dyspnea on exertion.  The patient states that he quit smoking last week.  He denies any vomiting, diarrhea, or constipation.  He denies any headache or visual changes.  He is here today for evaluation before starting his last cycle of weekly chemotherapy.    MEDICAL HISTORY: Past Medical History:  Diagnosis Date  . Hyperlipidemia   . Pneumonia   . Tobacco abuse     ALLERGIES:  is allergic to norco  [hydrocodone-acetaminophen].  MEDICATIONS:  Current Outpatient Medications  Medication Sig Dispense Refill  . albuterol (VENTOLIN HFA) 108 (90 Base) MCG/ACT inhaler Inhale 2 puffs into the lungs every 4 (four) hours as needed for wheezing or shortness of breath. 18 g 6  . aspirin 325 MG tablet Take 650 mg by mouth daily as needed for moderate pain.    . Aspirin-Acetaminophen-Caffeine (GOODY HEADACHE PO) Take 1 packet by mouth daily as needed (headaches).    Marland Kitchen atorvastatin (LIPITOR) 20 MG tablet Take 20 mg by mouth daily.    Marland Kitchen bismuth subsalicylate (PEPTO BISMOL) 262 MG/15ML suspension Take 30 mLs by mouth every 6 (six) hours as needed for indigestion.    . prochlorperazine (COMPAZINE) 10 MG tablet Take 1 tablet (10 mg total) by mouth every 6 (six) hours as needed for nausea or vomiting. 30 tablet 2  . sildenafil (REVATIO) 20 MG tablet Take 20-60 mg by mouth daily as needed (ED).     . sucralfate (CARAFATE) 1 g tablet Take 1 tablet (1 g total) by mouth 4 (four) times daily -  with meals and at bedtime. Dissolve in 10 mL of warm water prior to swallowing 120 tablet 1  . Tetrahydrozoline HCl (VISINE OP) Place 1 drop into both eyes daily as needed (redness).    . Tiotropium Bromide Monohydrate (SPIRIVA RESPIMAT) 1.25 MCG/ACT AERS Inhale 1 puff into the lungs daily. 4 g 11  . Tiotropium Bromide Monohydrate (SPIRIVA RESPIMAT) 1.25 MCG/ACT AERS Inhale 2 puffs into the lungs daily. 4 g 0   No current facility-administered medications for this visit.  SURGICAL HISTORY:  Past Surgical History:  Procedure Laterality Date  . COLONOSCOPY    . HAND SURGERY    . VIDEO BRONCHOSCOPY WITH ENDOBRONCHIAL ULTRASOUND N/A 12/22/2018   Procedure: VIDEO BRONCHOSCOPY WITH ENDOBRONCHIAL ULTRASOUND;  Surgeon: Garner Nash, DO;  Location: [facility];  Service: Thoracic;  Laterality: N/A;  . VIDEO BRONCHOSCOPY WITH ENDOBRONCHIAL ULTRASOUND N/A 01/09/2019   Procedure: VIDEO BRONCHOSCOPY WITH ENDOBRONCHIAL ULTRASOUND  WITH FLUORO;  Surgeon: Garner Nash, DO;  Location: [facility];  Service: Thoracic;  Laterality: N/A;  . VIDEO BRONCHOSCOPY WITH RADIAL ENDOBRONCHIAL ULTRASOUND N/A 12/22/2018   Procedure: RADIAL ENDOBRONCHIAL ULTRASOUND;  Surgeon: Garner Nash, DO;  Location: [facility];  Service: Thoracic;  Laterality: N/A;    REVIEW OF SYSTEMS:   Review of Systems  Constitutional: Positive for appetite change, weight loss, and fatigue. Negative for chills and fever. HENT:  Positive for odynophagia and dysphagia.  Negative for mouth sores and  Nosebleeds. Eyes: Negative for eye problems and icterus.  Respiratory: Positive for baseline cough and dyspnea on exertion. Negative for  hemoptysis and wheezing.   Cardiovascular: Positive for chest pain/indigestion. Negative for leg swelling.  Gastrointestinal: Positive for mild nausea. Negative for abdominal pain, constipation, diarrhea, and vomiting.  Genitourinary: Negative for bladder incontinence, difficulty urinating, dysuria, frequency and hematuria.   Musculoskeletal: Negative for back pain, gait problem, neck pain and neck stiffness.  Skin: Negative for itching and rash.  Neurological: Negative for dizziness, extremity weakness, gait problem, headaches, light-headedness and seizures.  Hematological: Negative for adenopathy. Does not bruise/bleed easily.  Psychiatric/Behavioral: Negative for confusion, depression and sleep disturbance. The patient is not nervous/anxious.     PHYSICAL EXAMINATION:  There were no vitals taken for this visit.  ECOG PERFORMANCE STATUS: 1 - Symptomatic but completely ambulatory  Physical Exam  Constitutional: Oriented to person, place, and time and well-developed, well-nourished, and in no distress.  HENT:  Head: Normocephalic and atraumatic.  Mouth/Throat: Oropharynx is clear and moist. No oropharyngeal exudate.  Eyes: Conjunctivae are normal. Right eye exhibits no discharge. Left eye exhibits no discharge. No scleral  icterus.  Neck: Normal range of motion. Neck supple.  Cardiovascular: Normal rate, regular rhythm, normal heart sounds and intact distal pulses.   Pulmonary/Chest: Effort normal and breath sounds normal. No respiratory distress. No wheezes. No rales.  Abdominal: Soft. Bowel sounds are normal. Exhibits no distension and no mass. There is no tenderness.  Musculoskeletal: Normal range of motion. Exhibits no edema.  Lymphadenopathy:    No cervical adenopathy.  Neurological: Alert and oriented to person, place, and time. Exhibits normal muscle tone. Gait normal. Coordination normal.  Skin: Skin is warm and dry. No rash noted. Not diaphoretic. No erythema. No pallor.  Psychiatric: Mood, memory and judgment normal.  Vitals reviewed.  LABORATORY DATA: Lab Results  Component Value Date   WBC 2.5 (L) 03/06/2019   HGB 14.1 03/06/2019   HCT 41.5 03/06/2019   MCV 93.3 03/06/2019   PLT 109 (L) 03/06/2019      Chemistry      Component Value Date/Time   NA 140 03/06/2019 0949   K 4.6 03/06/2019 0949   CL 107 03/06/2019 0949   CO2 26 03/06/2019 0949   BUN 19 03/06/2019 0949   CREATININE 0.96 03/06/2019 0949      Component Value Date/Time   CALCIUM 9.3 03/06/2019 0949   ALKPHOS 67 03/06/2019 0949   AST 16 03/06/2019 0949   ALT 20 03/06/2019 0949   BILITOT 0.4 03/06/2019 0949  RADIOGRAPHIC STUDIES:  No results found.   ASSESSMENT/PLAN:  This is a very pleasant 61 year oldCaucasianmale recently diagnosed with a stage IIIc (T4,N3, M0)non-small cell lung cancer, adenocarcinoma presented with large right hilar mass in addition to bilateral hilar and mediastinal lymphadenopathy diagnosed in December 2020.Molecular studies negative for any actionable mutations.   He is currently undergoing treatment with weekly concurrent chemoradiation with carboplatin for an AUC of 2 and paclitaxel 45 mg/m2. He is status post 6 cycles. His last radiation treatment was on 03/08/2019; therefore, he  will not receive cycle #7 today and will stop his current chemotherapy.  The patient was seen with Dr. Julien Nordmann. We will arrange for the patient to receive IVF while in the clinic today secondary to his poor oral intake. Radiation oncology will send in a prescription for viscous lidocaine for his odynophagia.   I will arrange for the patient to have a restaging CT scan of his chest prior to his next visit.   We will see the patient back for a follow up visit in 3 weeks for evaluation and to review his CT scan before discussing the next steps in his treatment.   He will see pulmonology later this month for management of his MAC infection.   The patient was advised to call immediately if he has any concerning symptoms in the interval. The patient voices understanding of current disease status and treatment options and is in agreement with the current care plan. All questions were answered. The patient knows to call the clinic with any problems, questions or concerns. We can certainly see the patient much sooner if necessary   No orders of the defined types were placed in this encounter.     L , [name] 03/12/19  ADDENDUM: Hematology/Oncology Attending: I had a face-to-face encounter with the patient today.  I recommended his care plan.  This is a very pleasant 64 years old white male with a stage IIIc non-small cell lung cancer, adenocarcinoma diagnosed in December 2020.  The patient recently completed a course of concurrent chemoradiation with weekly carboplatin and paclitaxel.  His radiation was discontinued earlier secondary to significant adverse effects with significant odynophagia and dysphagia secondary to radiation-induced esophagitis. The patient also lost few pounds since his last visit.  He is not feeling good and does not drink enough. I will discontinue his chemotherapy at this point. For the dehydration, I will arrange for the patient to receive 1 L of normal  saline in the clinic today. For the odynophagia and dysphagia, he will continue his current treatment with Carafate and he also had new prescription for viscous lidocaine. We will see him back for follow-up visit in 3 weeks for evaluation and repeat CT scan of the chest for restaging of his disease. The patient was advised to call immediately if he has any concerning symptoms in the interval.  Disclaimer: This note was dictated with voice recognition software. Similar sounding words can inadvertently be transcribed and may be missed upon review. Eilleen Kempf, MD 03/13/19

## 2019-03-13 ENCOUNTER — Ambulatory Visit: Payer: No Typology Code available for payment source

## 2019-03-13 ENCOUNTER — Other Ambulatory Visit: Payer: Self-pay | Admitting: Radiation Oncology

## 2019-03-13 ENCOUNTER — Encounter: Payer: Self-pay | Admitting: Physician Assistant

## 2019-03-13 ENCOUNTER — Inpatient Hospital Stay (HOSPITAL_BASED_OUTPATIENT_CLINIC_OR_DEPARTMENT_OTHER): Payer: No Typology Code available for payment source | Admitting: Physician Assistant

## 2019-03-13 ENCOUNTER — Other Ambulatory Visit: Payer: Self-pay

## 2019-03-13 ENCOUNTER — Inpatient Hospital Stay: Payer: No Typology Code available for payment source

## 2019-03-13 VITALS — BP 97/70 | HR 105 | Temp 98.0°F | Resp 18 | Ht 66.0 in | Wt 181.6 lb

## 2019-03-13 DIAGNOSIS — C3491 Malignant neoplasm of unspecified part of right bronchus or lung: Secondary | ICD-10-CM | POA: Diagnosis not present

## 2019-03-13 DIAGNOSIS — E860 Dehydration: Secondary | ICD-10-CM | POA: Diagnosis not present

## 2019-03-13 DIAGNOSIS — R1310 Dysphagia, unspecified: Secondary | ICD-10-CM | POA: Insufficient documentation

## 2019-03-13 LAB — CBC WITH DIFFERENTIAL (CANCER CENTER ONLY)
Abs Immature Granulocytes: 0.01 10*3/uL (ref 0.00–0.07)
Basophils Absolute: 0 10*3/uL (ref 0.0–0.1)
Basophils Relative: 1 %
Eosinophils Absolute: 0 10*3/uL (ref 0.0–0.5)
Eosinophils Relative: 3 %
HCT: 38 % — ABNORMAL LOW (ref 39.0–52.0)
Hemoglobin: 13.6 g/dL (ref 13.0–17.0)
Immature Granulocytes: 1 %
Lymphocytes Relative: 16 %
Lymphs Abs: 0.3 10*3/uL — ABNORMAL LOW (ref 0.7–4.0)
MCH: 31.9 pg (ref 26.0–34.0)
MCHC: 35.8 g/dL (ref 30.0–36.0)
MCV: 89.2 fL (ref 80.0–100.0)
Monocytes Absolute: 0.2 10*3/uL (ref 0.1–1.0)
Monocytes Relative: 14 %
Neutro Abs: 1.1 10*3/uL — ABNORMAL LOW (ref 1.7–7.7)
Neutrophils Relative %: 65 %
Platelet Count: 162 10*3/uL (ref 150–400)
RBC: 4.26 MIL/uL (ref 4.22–5.81)
RDW: 13.8 % (ref 11.5–15.5)
WBC Count: 1.6 10*3/uL — ABNORMAL LOW (ref 4.0–10.5)
nRBC: 0 % (ref 0.0–0.2)

## 2019-03-13 LAB — CMP (CANCER CENTER ONLY)
ALT: 20 U/L (ref 0–44)
AST: 16 U/L (ref 15–41)
Albumin: 3.7 g/dL (ref 3.5–5.0)
Alkaline Phosphatase: 75 U/L (ref 38–126)
Anion gap: 9 (ref 5–15)
BUN: 13 mg/dL (ref 8–23)
CO2: 24 mmol/L (ref 22–32)
Calcium: 9.2 mg/dL (ref 8.9–10.3)
Chloride: 107 mmol/L (ref 98–111)
Creatinine: 0.84 mg/dL (ref 0.61–1.24)
GFR, Est AFR Am: >60 mL/min (ref >60)
GFR, Estimated: >60 mL/min (ref >60)
Glucose, Bld: 108 mg/dL — ABNORMAL HIGH (ref 70–99)
Potassium: 4.3 mmol/L (ref 3.5–5.1)
Sodium: 140 mmol/L (ref 135–145)
Total Bilirubin: 0.6 mg/dL (ref 0.3–1.2)
Total Protein: 6.9 g/dL (ref 6.5–8.1)

## 2019-03-13 MED ORDER — SODIUM CHLORIDE 0.9 % IV SOLN
Freq: Once | INTRAVENOUS | Status: AC
  Filled 2019-03-13: qty 250

## 2019-03-13 MED ORDER — LIDOCAINE VISCOUS HCL 2 % MT SOLN: 15.0000 mL | Freq: Three times a day (TID) | OROMUCOSAL | 2 refills | Status: DC

## 2019-03-13 NOTE — Patient Instructions (Signed)

## 2019-03-14 ENCOUNTER — Telehealth: Payer: Self-pay | Admitting: Medical Oncology

## 2019-03-14 ENCOUNTER — Ambulatory Visit: Payer: No Typology Code available for payment source

## 2019-03-14 ENCOUNTER — Telehealth: Payer: Self-pay | Admitting: Internal Medicine

## 2019-03-14 NOTE — Telephone Encounter (Signed)
Scheduled per los. Called and left msg. Mailed printout  °

## 2019-03-14 NOTE — Telephone Encounter (Signed)
Returned call and I left message to call me back.

## 2019-03-15 ENCOUNTER — Ambulatory Visit: Payer: No Typology Code available for payment source

## 2019-03-16 ENCOUNTER — Ambulatory Visit: Payer: No Typology Code available for payment source

## 2019-03-17 ENCOUNTER — Ambulatory Visit: Payer: No Typology Code available for payment source

## 2019-03-22 ENCOUNTER — Telehealth: Payer: Self-pay | Admitting: Medical Oncology

## 2019-03-22 NOTE — Progress Notes (Incomplete)
  Patient Name: Philip [name] MRN: [id number] DOB: 04/28/1955 Referring Physician: Delena Bali (Profile Not Attached) Date of Service: 03/08/2019 [facility], [place]                                                        End Of Treatment Note  Diagnoses: C34.91-Malignant neoplasm of unspecified part of right bronchus or lung  Cancer Staging: Stage IIIC(T4,N3, M0)non-small cell lung cancer, adenocarcinoma  Intent: Curative  Radiation Treatment Dates: 02/06/2019 through 03/08/2019 Site Technique Total Dose (Gy) Dose per Fx (Gy) Completed Fx Beam Energies  Lung, Right: Lung_Rt IMRT 46/46 2 23/23 10X   Narrative: The patient tolerated radiation therapy relatively well. He reported worsening fatigue, "lump in throat", difficulty swallowing, and heartburn, for which he was advised to take OTC Prilosec or Nexium. He was also given a prescription for Carafate, which he stated provided relief. He denied hemoptysis, chest pain, back pain, and abdominal pain.  The patient was receiving chemotherapy every Monday in addition to radiation therapy. He reported some chemotherapy-related side effects including productive cough with white sputum and shortness of breath with exertion. Cough improved towards the end of treatment.  Plan: The patient will follow-up with radiation oncology in one month.  ________________________________________________   Blair Promise, PhD, MD  This document serves as a record of services personally performed by Gery Pray, MD. It was created on his behalf by Clerance Lav, a trained medical scribe. The creation of this record is based on the scribe's personal observations and the provider's statements to them. This document has been checked and approved by the attending provider.

## 2019-03-22 NOTE — Telephone Encounter (Signed)
Faxed last office note to [facility].

## 2019-03-31 ENCOUNTER — Other Ambulatory Visit: Payer: Self-pay

## 2019-03-31 ENCOUNTER — Telehealth: Payer: Self-pay | Admitting: *Deleted

## 2019-03-31 ENCOUNTER — Telehealth: Payer: Self-pay | Admitting: Internal Medicine

## 2019-03-31 ENCOUNTER — Inpatient Hospital Stay: Payer: No Typology Code available for payment source

## 2019-03-31 DIAGNOSIS — C3491 Malignant neoplasm of unspecified part of right bronchus or lung: Secondary | ICD-10-CM | POA: Diagnosis not present

## 2019-03-31 LAB — CMP (CANCER CENTER ONLY)
ALT: 28 U/L (ref 0–44)
AST: 20 U/L (ref 15–41)
Albumin: 3.6 g/dL (ref 3.5–5.0)
Alkaline Phosphatase: 97 U/L (ref 38–126)
Anion gap: 9 (ref 5–15)
BUN: 12 mg/dL (ref 8–23)
CO2: 25 mmol/L (ref 22–32)
Calcium: 9 mg/dL (ref 8.9–10.3)
Chloride: 103 mmol/L (ref 98–111)
Creatinine: 0.85 mg/dL (ref 0.61–1.24)
GFR, Est AFR Am: >60 mL/min (ref >60)
GFR, Estimated: >60 mL/min (ref >60)
Glucose, Bld: 97 mg/dL (ref 70–99)
Potassium: 4.1 mmol/L (ref 3.5–5.1)
Sodium: 137 mmol/L (ref 135–145)
Total Bilirubin: 0.5 mg/dL (ref 0.3–1.2)
Total Protein: 7.2 g/dL (ref 6.5–8.1)

## 2019-03-31 LAB — CBC WITH DIFFERENTIAL (CANCER CENTER ONLY)
Abs Immature Granulocytes: 0.06 10*3/uL (ref 0.00–0.07)
Basophils Absolute: 0.1 10*3/uL (ref 0.0–0.1)
Basophils Relative: 1 %
Eosinophils Absolute: 0 10*3/uL (ref 0.0–0.5)
Eosinophils Relative: 1 %
HCT: 38.1 % — ABNORMAL LOW (ref 39.0–52.0)
Hemoglobin: 13.5 g/dL (ref 13.0–17.0)
Immature Granulocytes: 1 %
Lymphocytes Relative: 10 %
Lymphs Abs: 0.6 10*3/uL — ABNORMAL LOW (ref 0.7–4.0)
MCH: 32.8 pg (ref 26.0–34.0)
MCHC: 35.4 g/dL (ref 30.0–36.0)
MCV: 92.5 fL (ref 80.0–100.0)
Monocytes Absolute: 0.9 10*3/uL (ref 0.1–1.0)
Monocytes Relative: 15 %
Neutro Abs: 4.1 10*3/uL (ref 1.7–7.7)
Neutrophils Relative %: 72 %
Platelet Count: 197 10*3/uL (ref 150–400)
RBC: 4.12 MIL/uL — ABNORMAL LOW (ref 4.22–5.81)
RDW: 15.2 % (ref 11.5–15.5)
WBC Count: 5.7 10*3/uL (ref 4.0–10.5)
nRBC: 0 % (ref 0.0–0.2)

## 2019-03-31 NOTE — Telephone Encounter (Signed)
Pt showed up for labs & thought he was to have a CT.  CT was not scheduled.  Called [facility] & they reported that pt was called twice, once on 2/9, & once on 2/10.  Pt reports that he never got call.  Scheduled CT for 04/10/19 @ 3:30 for arrival at 3:15 pm.  Informed liquids only 4 hours prior to CT.  Will r/s provider appt to after CT.

## 2019-03-31 NOTE — Telephone Encounter (Signed)
R/s appt per 2/26 sch message - pt aware of appt date and time

## 2019-04-03 ENCOUNTER — Ambulatory Visit: Payer: No Typology Code available for payment source | Admitting: Physician Assistant

## 2019-04-10 ENCOUNTER — Encounter (HOSPITAL_COMMUNITY): Payer: Self-pay

## 2019-04-10 ENCOUNTER — Ambulatory Visit (HOSPITAL_COMMUNITY)
Admission: RE | Admit: 2019-04-10 | Discharge: 2019-04-10 | Disposition: A | Discharge status: Home / Self Care | Payer: No Typology Code available for payment source | Source: Ambulatory Visit | Attending: Physician Assistant | Admitting: Physician Assistant

## 2019-04-10 ENCOUNTER — Other Ambulatory Visit: Payer: Self-pay

## 2019-04-10 DIAGNOSIS — C3491 Malignant neoplasm of unspecified part of right bronchus or lung: Secondary | ICD-10-CM | POA: Insufficient documentation

## 2019-04-10 MED ORDER — IOHEXOL 300 MG/ML  SOLN
75.0000 mL | Freq: Once | INTRAMUSCULAR | Status: AC | PRN
  Administered 2019-04-10: 17:00:00 75 mL via INTRAVENOUS

## 2019-04-10 MED ORDER — SODIUM CHLORIDE (PF) 0.9 % IJ SOLN
INTRAMUSCULAR | Status: AC
  Filled 2019-04-10: qty 50

## 2019-04-11 ENCOUNTER — Telehealth: Payer: Self-pay | Admitting: Pharmacy Technician

## 2019-04-11 ENCOUNTER — Encounter: Payer: Self-pay | Admitting: Critical Care Medicine

## 2019-04-11 ENCOUNTER — Telehealth: Payer: Self-pay | Admitting: *Deleted

## 2019-04-11 ENCOUNTER — Ambulatory Visit (INDEPENDENT_AMBULATORY_CARE_PROVIDER_SITE_OTHER): Payer: No Typology Code available for payment source | Admitting: Critical Care Medicine

## 2019-04-11 VITALS — BP 100/68 | HR 89 | Ht 66.0 in | Wt 183.8 lb

## 2019-04-11 DIAGNOSIS — A310 Pulmonary mycobacterial infection: Secondary | ICD-10-CM | POA: Diagnosis not present

## 2019-04-11 NOTE — Progress Notes (Signed)
[place] OFFICE PROGRESS NOTE  [name], MD [address] [place] [address]  DIAGNOSIS: Stage IIIc (T4,N3, M0)non-small cell lung cancer, adenocarcinoma presented with large right hilar mass in addition to bilateral hilar and mediastinal lymphadenopathy diagnosed in December 2020.  PRIOR THERAPY: Weekly concurrent chemoradiation with carboplatin for an AUC of 2 and paclitaxel 45 mg/m2. Last dose of chemotherapy on 03/06/2019.Status post 6 cycles.  CURRENT THERAPY: Consolidation immunotherapy with Imfinzi 1500 mg IV every 4 weeks. First dose expected on 04/19/2019    INTERVAL HISTORY: Philip [name] 64 y.o. male returns to the clinic for a follow up visit. The patient recently completed concurrent chemoradiation. He had a challenging time with the end of this treatment due to radiation induced esophagitis. Since completing treatment, his swallowing is significantly improved at this time.  He recently saw pulmonology who recommendations for his MAI infection.   Today, he denies fevers or chills. He still experiences night sweats which has been present for many years. Denies any  Hemoptysis or chest pain. He still has dyspnea on exertion. He quit smoking 1 month ago and reports significant improvement in his cough. He states he no longer has a smoker's cough.  He denies any vomiting, diarrhea, or constipation.  He denies any headache or visual changes. He recently had a restaging CT scan. He is here for evaluation and to review his CT scan and a more detailed discussion about his current condition and treatment options.    MEDICAL HISTORY: Past Medical History:  Diagnosis Date  . Hyperlipidemia   . Pneumonia   . Tobacco abuse     ALLERGIES:  is allergic to norco [hydrocodone-acetaminophen].  MEDICATIONS:  Current Outpatient Medications  Medication Sig Dispense Refill  . albuterol (VENTOLIN HFA) 108 (90 Base) MCG/ACT inhaler Inhale 2 puffs  into the lungs every 4 (four) hours as needed for wheezing or shortness of breath. 18 g 6  . aspirin 325 MG tablet Take 650 mg by mouth daily as needed for moderate pain.    . Aspirin-Acetaminophen-Caffeine (GOODY HEADACHE PO) Take 1 packet by mouth daily as needed (headaches).    Marland Kitchen atorvastatin (LIPITOR) 20 MG tablet Take 20 mg by mouth daily.    Marland Kitchen bismuth subsalicylate (PEPTO BISMOL) 262 MG/15ML suspension Take 30 mLs by mouth every 6 (six) hours as needed for indigestion.    . lidocaine (XYLOCAINE) 2 % solution Use as directed 15 mLs in the mouth or throat 4 (four) times daily -  before meals and at bedtime. 20 minutes prior to meals 100 mL 2  . prochlorperazine (COMPAZINE) 10 MG tablet Take 1 tablet (10 mg total) by mouth every 6 (six) hours as needed for nausea or vomiting. 30 tablet 2  . sildenafil (REVATIO) 20 MG tablet Take 20-60 mg by mouth daily as needed (ED).     . sucralfate (CARAFATE) 1 g tablet Take 1 tablet (1 g total) by mouth 4 (four) times daily -  with meals and at bedtime. Dissolve in 10 mL of warm water prior to swallowing 120 tablet 1  . Tetrahydrozoline HCl (VISINE OP) Place 1 drop into both eyes daily as needed (redness).    . Tiotropium Bromide Monohydrate (SPIRIVA RESPIMAT) 1.25 MCG/ACT AERS Inhale 1 puff into the lungs daily. (Patient not taking: Reported on 04/11/2019) 4 g 11  . Tiotropium Bromide Monohydrate (SPIRIVA RESPIMAT) 1.25 MCG/ACT AERS Inhale 2 puffs into the lungs daily. (Patient not taking: Reported on 04/11/2019) 4 g  0   No current facility-administered medications for this visit.    SURGICAL HISTORY:  Past Surgical History:  Procedure Laterality Date  . COLONOSCOPY    . HAND SURGERY    . VIDEO BRONCHOSCOPY WITH ENDOBRONCHIAL ULTRASOUND N/A 12/22/2018   Procedure: VIDEO BRONCHOSCOPY WITH ENDOBRONCHIAL ULTRASOUND;  Surgeon: Garner Nash, DO;  Location: [facility];  Service: Thoracic;  Laterality: N/A;  . VIDEO BRONCHOSCOPY WITH ENDOBRONCHIAL ULTRASOUND N/A  01/09/2019   Procedure: VIDEO BRONCHOSCOPY WITH ENDOBRONCHIAL ULTRASOUND WITH FLUORO;  Surgeon: Garner Nash, DO;  Location: [facility];  Service: Thoracic;  Laterality: N/A;  . VIDEO BRONCHOSCOPY WITH RADIAL ENDOBRONCHIAL ULTRASOUND N/A 12/22/2018   Procedure: RADIAL ENDOBRONCHIAL ULTRASOUND;  Surgeon: Garner Nash, DO;  Location: [facility];  Service: Thoracic;  Laterality: N/A;    REVIEW OF SYSTEMS:   Review of Systems  Constitutional: Negative for appetite change, chills, fatigue, fever and unexpected weight change.  HENT: Negative for mouth sores, nosebleeds, sore throat and trouble swallowing.   Eyes: Negative for eye problems and icterus.  Respiratory: Positive for dyspnea on exertion. Negative for cough, hemoptysis,  and wheezing.   Cardiovascular: Negative for chest pain and leg swelling.  Gastrointestinal: Negative for abdominal pain, constipation, diarrhea, nausea and vomiting.  Genitourinary: Negative for bladder incontinence, difficulty urinating, dysuria, frequency and hematuria.   Musculoskeletal: Negative for back pain, gait problem, neck pain and neck stiffness.  Skin: Negative for itching and rash.  Neurological: Negative for dizziness, extremity weakness, gait problem, headaches, light-headedness and seizures.  Hematological: Negative for adenopathy. Does not bruise/bleed easily.  Psychiatric/Behavioral: Negative for confusion, depression and sleep disturbance. The patient is not nervous/anxious.     PHYSICAL EXAMINATION:  Blood pressure 101/68, pulse 81, temperature 98.2 F (36.8 C), temperature source Oral, resp. rate 18, height 5\' 6"  (1.676 m), weight 185 lb 1.6 oz (84 kg), SpO2 97 %.  ECOG PERFORMANCE STATUS: 1 - Symptomatic but completely ambulatory  Physical Exam  Constitutional: Oriented to person, place, and time and well-developed, well-nourished, and in no distress.   HENT:  Head: Normocephalic and atraumatic.  Mouth/Throat: Oropharynx is clear and moist. No  oropharyngeal exudate.  Eyes: Conjunctivae are normal. Right eye exhibits no discharge. Left eye exhibits no discharge. No scleral icterus.  Neck: Normal range of motion. Neck supple.  Cardiovascular: Normal rate, regular rhythm, normal heart sounds and intact distal pulses.   Pulmonary/Chest: Effort normal and breath sounds normal. No respiratory distress. No wheezes. No rales.  Abdominal: Soft. Bowel sounds are normal. Exhibits no distension and no mass. There is no tenderness.  Musculoskeletal: Normal range of motion. Exhibits no edema.  Lymphadenopathy:    No cervical adenopathy.  Neurological: Alert and oriented to person, place, and time. Exhibits normal muscle tone. Gait normal. Coordination normal.  Skin: Skin is warm and dry. No rash noted. Not diaphoretic. No erythema. No pallor.  Psychiatric: Mood, memory and judgment normal.  Vitals reviewed.  LABORATORY DATA: Lab Results  Component Value Date   WBC 5.7 03/31/2019   HGB 13.5 03/31/2019   HCT 38.1 (L) 03/31/2019   MCV 92.5 03/31/2019   PLT 197 03/31/2019      Chemistry      Component Value Date/Time   NA 137 03/31/2019 0924   K 4.1 03/31/2019 0924   CL 103 03/31/2019 0924   CO2 25 03/31/2019 0924   BUN 12 03/31/2019 0924   CREATININE 0.85 03/31/2019 0924      Component Value Date/Time   CALCIUM 9.0 03/31/2019  0924   ALKPHOS 97 03/31/2019 0924   AST 20 03/31/2019 0924   ALT 28 03/31/2019 0924   BILITOT 0.5 03/31/2019 0924       RADIOGRAPHIC STUDIES:  CT Chest W Contrast  Result Date: 04/10/2019 CLINICAL DATA:  Restaging of lung cancer. Post radiation and chemotherapy with last radiation treatment on 03/08/2019 EXAM: CT CHEST WITH CONTRAST TECHNIQUE: Multidetector CT imaging of the chest was performed during intravenous contrast administration. CONTRAST:  22mL OMNIPAQUE IOHEXOL 300 MG/ML  SOLN COMPARISON:  12/09/2018 FINDINGS: Cardiovascular: Calcified atherosclerotic changes scattered about the thoracic aorta.  Heart size is stable. Small pericardial effusion. Central pulmonary arteries are normal. Distortion of right hilar structures following radiotherapy, see below. Mediastinum/Nodes: Decreased size of AP (image 42, series 2) 12 mm as compared to 16 mm on the prior study. Decreased size of right paratracheal lymph node measuring 7 mm as compared to 13 mm (image 43, series 2) similar decrease in size of other mediastinal lymph nodes. Stable mild enlargement of subcarinal lymph nodes at 9 mm greatest short axis dimension. No signs of axillary lymphadenopathy. Fullness of the right hilum similar to the prior exam. No signs of left hilar adenopathy with borderline enlarged lymph nodes in this location largest measuring approximately 7 mm. Esophagus is normal. Thoracic inlet structures are unremarkable. Lungs/Pleura: Masslike area in the right hilum measures approximately 3.8 x 3.0 cm in greatest axial dimension at the level of the hilum, unchanged compared to the PET-CT that was acquired on 01/03/2019. Infrahilar soft tissue is also unchanged. Volume loss in the right lower lobe showing less nodularity at the periphery and with diminished size of nodule along the anterior margin of post treatment changes (image 81, series 5) approximately 7 mm. This area measured approximately 10 mm on the prior exam. Background of pulmonary emphysema is moderate to marked and worse towards the lung apices. No signs of pleural effusion. No signs of pleural nodularity. Upper Abdomen: Normal appearance of visualized liver. Spleen is normal size. Left adrenal nodule compatible with adrenal adenoma unchanged. Thickening of right adrenal also with benign appearance is stable Musculoskeletal: IMPRESSION: 1. Stable appearance of right perihilar mass and right perihilar soft tissue. Residual viable tumor in this location is not excluded, attention on follow-up given the convex margins extending into the adjacent lung parenchyma. This is  particularly true on image 47 at the level of the hilum. 2. Decreased size of mediastinal lymph nodes. 3. Decreased size of nodule along the anterior margin of the right lower lobe post treatment changes. 4. Stable left adrenal adenoma. Aortic Atherosclerosis (ICD10-I70.0). Electronically Signed   By: Zetta Bills M.D.   On: 04/10/2019 18:05     ASSESSMENT/PLAN:  This is a very pleasant 14 year oldCaucasianmale recently diagnosed with a stage IIIc (T4,N3, M0)non-small cell lung cancer, adenocarcinoma presented with large right hilar mass in addition to bilateral hilar and mediastinal lymphadenopathy diagnosed in December 2020.Molecular studies negative for any actionable mutations.  He completed weekly concurrent chemoradiation with carboplatin for an AUC of 2 and paclitaxel 45 mg/m2.He is status post 6 cycles. His last radiation treatment was on 03/08/2019.   The patient was seen with Dr. Julien Nordmann. Dr. Julien Nordmann personally and independently reviewed his restaging CT scan. The scan showed stable disease and some shrinkage of his lymphadenopathy.  Dr. Julien Nordmann had a lengthy discussion today with the patient about his current condition and treatment options.  Dr. Julien Nordmann recommends that the patient undergo consolidation immunotherapy with Imfinzi 1500 mg IV every  4 weeks.  The patient is interested in this option and he is expected to receive his first dose next week.  I discussed with him the adverse effect of the immunotherapy including but not limited to immunotherapy mediated skin rash, diarrhea, inflammation of the lung, kidney, liver, thyroid or other endocrine dysfunction,  We will see the patient back for follow-up visit in 2 weeks  for evaluation and for a 1 week follow up visit.   The patient was advised to call immediately if he has any concerning symptoms in the interval. The patient voices understanding of current disease status and treatment options and is in agreement with the  current care plan. All questions were answered. The patient knows to call the clinic with any problems, questions or concerns. We can certainly see the patient much sooner if necessary    Orders Placed This Encounter  Procedures  . CBC with Differential (Cancer Center Only)    Standing Status:   Standing    Number of Occurrences:   13    Standing Expiration Date:   04/11/2020  . CMP ([facility] only)    Standing Status:   Standing    Number of Occurrences:   13    Standing Expiration Date:   04/11/2020  . TSH    Standing Status:   Standing    Number of Occurrences:   13    Standing Expiration Date:   04/11/2020     Philip Schnabel L Latrise Bowland, [name] 04/12/19  ADDENDUM: Hematology/oncology Attending: I had a face-to-face encounter with the patient today.  I recommended his care plan.  This is a very pleasant 64 years old white male with a stage IIIc non-small cell lung cancer, adenocarcinoma status post a course of induction concurrent chemoradiation with weekly carboplatin and paclitaxel.  The patient tolerated his treatment well with no concerning adverse effects. He had repeat CT scan of the chest performed recently.  I personally and independently reviewed the scans and discussed the results with the patient and his wife today. His scan showed partial response of his disease. I discussed with the patient consolidation treatment options including continued observation versus treatment with Imfinzi 1500 mg IV every 4 weeks.  The patient is interested in proceeding with the consolidation immunotherapy.  I discussed with him the adverse effect of this treatment including but not limited to immunotherapy mediated skin rash, diarrhea, inflammation of the lung, kidney, liver, thyroid or other endocrine dysfunction. He is expected to start the first cycle of this treatment next week. The patient will come back for follow-up visit in 5 weeks with the start of cycle #2. He was advised to call  immediately if he has any concerning symptoms in the interval.  Disclaimer: This note was dictated with voice recognition software. Similar sounding words can inadvertently be transcribed and may be missed upon review. Eilleen Kempf, MD 04/12/19

## 2019-04-11 NOTE — Patient Instructions (Addendum)
Thank you for visiting Dr. Carlis Abbott at [facility]. We recommend the following: Orders Placed This Encounter  Procedures  . AFB Culture & Smear   Orders Placed This Encounter  Procedures  . AFB Culture & Smear    Standing Status:   Future    Standing Expiration Date:   04/10/2020      Return in about 3 months (around 07/12/2019).    Please do your part to reduce the spread of COVID-19.

## 2019-04-11 NOTE — Telephone Encounter (Signed)
Oncology Nurse Navigator Documentation  Oncology Nurse Navigator Flowsheets 04/11/2019  Abnormal Finding Date -  Confirmed Diagnosis Date -  Diagnosis Status -  Planned Course of Treatment -  Phase of Treatment -  Chemo/Radiation Concurrent Actual Start Date: -  Chemo/Radiation Concurrent Expected End Date: -  Navigator Follow Up Date: -  Navigator Follow Up Reason: -  Navigation Complete Date: 04/11/2019  Post Navigation: Continue to Follow Patient? Yes  Reason Not Navigating Patient: No Treatment, Observation Only  Navigator Location [facility]  Referral Date to RadOnc/MedOnc -  Navigator Encounter Type Telephone/Mr. Carelock call me asking if I had called him.  I did not but looked at chart notes to see who called him. I could not figure it out.  He had a scan yesterday and will see Cassie PA tomorrow. I called and spoke with him and that person did not call back.  He states he maybe a phone tree call to remind him of his appts this week.  I updated on appts this week and he verbalized understanding.    Telephone Incoming Call;Outgoing Call  Treatment Initiated Date -  Patient Visit Type -  Treatment Phase Post-Tx Follow-up  Barriers/Navigation Needs Education  Education Other  Interventions Education  Acuity Level 2-Minimal Needs (1-2 Barriers Identified)  Coordination of Care -  Education Method Verbal  Time Spent with Patient 15

## 2019-04-11 NOTE — Progress Notes (Signed)
Synopsis: Referred in January 2020 for pulmonary MAC by [name], MD.  Previously seen by Dr. Adair Laundry.  Previously seen by Dr. Valeta Harms.  Subjective:   PATIENT ID: Philip [name] GENDER: male DOB: Nov 26, 1955, MRN: [id number]  Chief Complaint  Patient presents with  . Follow-up    Patient has shortness of breath with exertion. Finished chemo and radiation 2 weeks ago and is trying to get back into the swing of things. Patient quit smoking about a month ago and doesn't cough up as much phelgm.     Mr. 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  He was last seen by oncology, [name] Heilingoetter and Dr. Earlie Server on 03/13/2019, note reviewed.  He has completed 6 cycles of carboplatin and paclitaxel, with progressive severe symptoms later in his course.  His course was stopped after the 6 cycles, and radiation therapy was stopped early due to esophagitis, nausea, 5 pound weight loss.  He will soon be starting immunotherapy.  He has gained weight back now and his appetite is returned to normal.  He still has mild dysgeusia which is improving.  His dysphagia is improved.  He feels very deconditioned overall from chemo with some shortness of breath with activity, but he is starting to feel better.  He has minimal phlegm since quitting smoking about a month ago.  He has continued using Abreva for his COPD.  Saw ENT at [facility] for audiology evaluation and management -bilateral sensorineural hearing loss and tinnitus.  Hearing aids have been recommended.    OV 03/02/2019: Mr. Volkert is a 64 y/o gentleman who presents for evaluation of recently diagnosed pulmonary MAI infection diagnosed at the time of his lung cancer diagnosis. He has stage IIIc lung adenocarcinoma (T4N3M0), which is currently receiving carboplatin-paclitaxel weekly (2 more weeks to go) and radiation (early January  through 2/12). He has smoked 2 ppd x 5years, and has cut down to 0.5ppd recently. He has been exposed to multiple occupational inhalation exposures without a respirator- wood dust, car paint, welding & metal grinding, pottery. 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 He has chronic tinnitus. He has never been evaluated by audiology. He has ben tolerating his chemo well with fatigue being his main complaint.   He has had a chronic cough with a productive cough for several years. He has occasional wheezing when he has sputum that he needs to clear. He has tried albuterol a few times since it was prescribed recently. He is active and has no DOE.      Past Medical History:  Diagnosis Date  . Hyperlipidemia   . Pneumonia   . Tobacco abuse      Family History  Problem Relation Age of Onset  . Cancer Father   . Stomach cancer Father      Past Surgical History:  Procedure Laterality Date  . COLONOSCOPY    . HAND SURGERY    . VIDEO BRONCHOSCOPY WITH ENDOBRONCHIAL ULTRASOUND N/A 12/22/2018   Procedure: VIDEO BRONCHOSCOPY WITH ENDOBRONCHIAL ULTRASOUND;  Surgeon: Garner Nash, DO;  Location: [facility];  Service: Thoracic;  Laterality: N/A;  . VIDEO BRONCHOSCOPY WITH ENDOBRONCHIAL ULTRASOUND N/A 01/09/2019   Procedure: VIDEO BRONCHOSCOPY WITH ENDOBRONCHIAL ULTRASOUND WITH FLUORO;  Surgeon: Garner Nash, DO;  Location: [facility];  Service: Thoracic;  Laterality: N/A;  .  VIDEO BRONCHOSCOPY WITH RADIAL ENDOBRONCHIAL ULTRASOUND N/A 12/22/2018   Procedure: RADIAL ENDOBRONCHIAL ULTRASOUND;  Surgeon: Garner Nash, DO;  Location: [facility];  Service: Thoracic;  Laterality: N/A;    Social History   Socioeconomic History  . Marital status: Married    Spouse name: Not on file  . Number of children: Not on file  . Years of education: Not on file  . Highest education level: Not on file  Occupational History  . Not on file   Tobacco Use  . Smoking status: Former Smoker    Packs/day: 0.50    Years: 45.00    Pack years: 22.50    Types: Cigarettes    Start date: 07/04/1970    Quit date: 03/14/2019    Years since quitting: 0.0  . Smokeless tobacco: Former Systems developer    Types: Chew  Substance and Sexual Activity  . Alcohol use: Yes    Comment: drinks 1/2 5th every weekend  . Drug use: Not Currently    Types: Marijuana    Comment: none for 20 years  . Sexual activity: Not on file  Other Topics Concern  . Not on file  Social History Narrative  . Not on file   Social Determinants of Health   Financial Resource Strain:   . Difficulty of Paying Living Expenses: Not on file  Food Insecurity:   . Worried About Charity fundraiser in the Last Year: Not on file  . Ran Out of Food in the Last Year: Not on file  Transportation Needs:   . Lack of Transportation (Medical): Not on file  . Lack of Transportation (Non-Medical): Not on file  Physical Activity:   . Days of Exercise per Week: Not on file  . Minutes of Exercise per Session: Not on file  Stress:   . Feeling of Stress : Not on file  Social Connections:   . Frequency of Communication with Friends and Family: Not on file  . Frequency of Social Gatherings with Friends and Family: Not on file  . Attends Religious Services: Not on file  . Active Member of Clubs or Organizations: Not on file  . Attends Archivist Meetings: Not on file  . Marital Status: Not on file  Intimate Partner Violence:   . Fear of Current or Ex-Partner: Not on file  . Emotionally Abused: Not on file  . Physically Abused: Not on file  . Sexually Abused: Not on file     Allergies  Allergen Reactions  . Norco [Hydrocodone-Acetaminophen] Other (See Comments)    Made pt feel jittery      Immunization History  Administered Date(s) Administered  . Influenza,inj,Quad PF,6+ Mos 11/08/2018  . Td 05/18/2014    Outpatient Medications Prior to Visit  Medication Sig Dispense  Refill  . albuterol (VENTOLIN HFA) 108 (90 Base) MCG/ACT inhaler Inhale 2 puffs into the lungs every 4 (four) hours as needed for wheezing or shortness of breath. 18 g 6  . aspirin 325 MG tablet Take 650 mg by mouth daily as needed for moderate pain.    . Aspirin-Acetaminophen-Caffeine (GOODY HEADACHE PO) Take 1 packet by mouth daily as needed (headaches).    Marland Kitchen atorvastatin (LIPITOR) 20 MG tablet Take 20 mg by mouth daily.    Marland Kitchen bismuth subsalicylate (PEPTO BISMOL) 262 MG/15ML suspension Take 30 mLs by mouth every 6 (six) hours as needed for indigestion.    . lidocaine (XYLOCAINE) 2 % solution Use as directed 15 mLs in the mouth or  throat 4 (four) times daily -  before meals and at bedtime. 20 minutes prior to meals 100 mL 2  . prochlorperazine (COMPAZINE) 10 MG tablet Take 1 tablet (10 mg total) by mouth every 6 (six) hours as needed for nausea or vomiting. 30 tablet 2  . sildenafil (REVATIO) 20 MG tablet Take 20-60 mg by mouth daily as needed (ED).     . sucralfate (CARAFATE) 1 g tablet Take 1 tablet (1 g total) by mouth 4 (four) times daily -  with meals and at bedtime. Dissolve in 10 mL of warm water prior to swallowing 120 tablet 1  . Tetrahydrozoline HCl (VISINE OP) Place 1 drop into both eyes daily as needed (redness).    . Tiotropium Bromide Monohydrate (SPIRIVA RESPIMAT) 1.25 MCG/ACT AERS Inhale 1 puff into the lungs daily. (Patient not taking: Reported on 04/11/2019) 4 g 11  . Tiotropium Bromide Monohydrate (SPIRIVA RESPIMAT) 1.25 MCG/ACT AERS Inhale 2 puffs into the lungs daily. (Patient not taking: Reported on 04/11/2019) 4 g 0   No facility-administered medications prior to visit.    Review of Systems  Constitutional: Positive for malaise/fatigue. Negative for chills and fever.  HENT: Positive for hearing loss.   Respiratory: Positive for cough and sputum production. Negative for wheezing.   Cardiovascular: Negative for chest pain and leg swelling.     Objective:   Vitals:    04/11/19 1058  BP: 100/68  Pulse: 89  SpO2: 97%  Weight: 183 lb 12.8 oz (83.4 kg)  Height: _0  (1.676 m)   97% on   RA BMI Readings from Last 3 Encounters:  04/11/19 29.67 kg/m  03/13/19 29.31 kg/m  03/02/19 29.96 kg/m   Wt Readings from Last 3 Encounters:  04/11/19 183 lb 12.8 oz (83.4 kg)  03/13/19 181 lb 9.6 oz (82.4 kg)  03/02/19 185 lb 9.6 oz (84.2 kg)    Physical Exam Vitals reviewed.  Constitutional:      Appearance: He is not ill-appearing.  HENT:     Head: Normocephalic and atraumatic.  Eyes:     General: No scleral icterus. Cardiovascular:     Rate and Rhythm: Normal rate and regular rhythm.     Heart sounds: No murmur.  Pulmonary:     Comments: Breathing comfortably on RA, no conversational dyspnea. CTAB. Abdominal:     General: There is no distension.     Palpations: Abdomen is soft.  Musculoskeletal:        General: No swelling or deformity.     Cervical back: Neck supple.  Lymphadenopathy:     Cervical: No cervical adenopathy.  Skin:    General: Skin is warm and dry.     Findings: No rash.  Neurological:     General: No focal deficit present.     Mental Status: He is alert.     Coordination: Coordination normal.  Psychiatric:        Mood and Affect: Mood normal.        Behavior: Behavior normal.      CBC    Component Value Date/Time   WBC 5.7 03/31/2019 0924   WBC 7.2 01/09/2019 0716   RBC 4.12 (L) 03/31/2019 0924   HGB 13.5 03/31/2019 0924   HCT 38.1 (L) 03/31/2019 0924   PLT 197 03/31/2019 0924   MCV 92.5 03/31/2019 0924   MCH 32.8 03/31/2019 0924   MCHC 35.4 03/31/2019 0924   RDW 15.2 03/31/2019 0924   LYMPHSABS 0.6 (L) 03/31/2019 0924   MONOABS 0.9  03/31/2019 0924   EOSABS 0.0 03/31/2019 0924   BASOSABS 0.1 03/31/2019 0924    CHEMISTRY No results for input(s): NA, K, CL, CO2, GLUCOSE, BUN, CREATININE, CALCIUM, MG, PHOS in the last 168 hours. Estimated Creatinine Clearance: 90.1 mL/min (by C-G formula based on SCr of  0.85 mg/dL).   Chest Imaging- films reviewed: CT PET 01/03/2019-severe emphysema, right hilar mass with air bronchograms, medial RLL cystic fibrotic changes.  PET avidity of the inferior portion of the mass and medial fibrotic areas of the lower lobe.  CT chest with contrast 04/10/2018-persistent right mass, unchanged right paraseptal emphysema versus cavitary lesions.  Improved adenopathy.   Micro: 01/09/2019 AFB: Component 1 mo ago  Organism ID [id number]    Comment: Mycobacterium avium complex  Amikacin Comment   Comment: 16.0 ug/mL Susceptible  Clarithromycin 2.0 ug/mL Susceptible   Linezolid Comment   Comment: 16.0 ug/mL Intermediate  Moxifloxacin 4.0 ug/mL Resistant   Streptomycin 64.0 ug/mL    01/09/2019 fungus culture-negative 01/09/2019 BAL-many PMN, negative culture  Pulmonary Functions Testing Results: PFT Results Latest Ref Rng & Units 12/21/2018  FVC-Pre L 3.07  FVC-Predicted Pre % 76  FVC-Post L 3.23  FVC-Predicted Post % 80  Pre FEV1/FVC % % 67  Post FEV1/FCV % % 71  FEV1-Pre L 2.07  FEV1-Predicted Pre % 68  FEV1-Post L 2.28  DLCO UNC% % 64  DLCO COR %Predicted % 79  TLC L 5.35  TLC % Predicted % 86  RV % Predicted % 104   2020- mild obstruction without significant bronchodilator reversibility.  No significant restriction, air trapping, or hyperinflation.  Mildly reduced diffusion.  Flow volume loop supports obstruction.  Pathology 01/09/2019: RLL adenocarcinoma November 2020 bronc nondiagnostic     Assessment & Plan:     ICD-10-CM   1. 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 Gloor today.  Agree with immunotherapy given persistence of lung mass.  We discussed that although rare, autoimmune side effects including interstitial pneumonia are possible with immunotherapy.  He should notify us immediately if he  develops worsening shortness of breath on therapy. -This remains the priority of his care  COPD- partial BD reversibility. Ongoing tobacco abuse. -Continue Spiriva -Continue albuterol as needed -Congratulated him on his success with quitting smoking.  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 Resistant to quinolones; intermediate susceptibility to linezolid.  Stable on imaging despite immunosuppression associated with chemotherapy regimen. -Discussed the risks, benefits, alternatives of 3 drug therapy.  I would prefer to get him started on his immunotherapy prior to initiating 3 drug oral therapy with likely IV amikacin or streptomycin for the first few months.  I will consult ID for assistance with parenteral antibiotic management when we are ready to start his MAI therapy.  I am concerned for significant side effects and his recent weight loss while on chemoradiation. -Repeat AFB culture -Monthly AFB cultures on treatment to document first negative culture to determine total duration of therapy needed.  -PLEASE AVOID MONOTHERAPY WITH MACROLIDES DUE TO RISK OF INDUCIBLE RESISTANCE OF MAI  Chronic hearing loss, tinnitus -Appreciate ENTs assistance. -May need periodic audiology evaluation while on MAI treatment in the future  RTC in 3 months.  [email or web address] "Should patients with MAC pulmonary disease be treated with a parenteral amikacin or streptomycin-containing regimen or without a parenteral amikacin or streptomycin-containing regimen? -For patients with cavitary  or advanced/severe bronchiectatic or macrolide-resistant MAC pulmonary disease, we suggest that parenteral amikacin or streptomycin be included in the initial treatment regimen (conditional recommendation, moderate certainty in estimates of effect)."  44 minutes were spent on this encounter including record  review, time spent face-to-face with the patient, and documentation.   Current Outpatient Medications:  .  albuterol (VENTOLIN HFA) 108 (90 Base) MCG/ACT inhaler, Inhale 2 puffs into the lungs every 4 (four) hours as needed for wheezing or shortness of breath., Disp: 18 g, Rfl: 6 .  aspirin 325 MG tablet, Take 650 mg by mouth daily as needed for moderate pain., Disp: , Rfl:  .  Aspirin-Acetaminophen-Caffeine (GOODY HEADACHE PO), Take 1 packet by mouth daily as needed (headaches)., Disp: , Rfl:  .  atorvastatin (LIPITOR) 20 MG tablet, Take 20 mg by mouth daily., Disp: , Rfl:  .  bismuth subsalicylate (PEPTO BISMOL) 262 MG/15ML suspension, Take 30 mLs by mouth every 6 (six) hours as needed for indigestion., Disp: , Rfl:  .  lidocaine (XYLOCAINE) 2 % solution, Use as directed 15 mLs in the mouth or throat 4 (four) times daily -  before meals and at bedtime. 20 minutes prior to meals, Disp: 100 mL, Rfl: 2 .  prochlorperazine (COMPAZINE) 10 MG tablet, Take 1 tablet (10 mg total) by mouth every 6 (six) hours as needed for nausea or vomiting., Disp: 30 tablet, Rfl: 2 .  sildenafil (REVATIO) 20 MG tablet, Take 20-60 mg by mouth daily as needed (ED). , Disp: , Rfl:  .  sucralfate (CARAFATE) 1 g tablet, Take 1 tablet (1 g total) by mouth 4 (four) times daily -  with meals and at bedtime. Dissolve in 10 mL of warm water prior to swallowing, Disp: 120 tablet, Rfl: 1 .  Tetrahydrozoline HCl (VISINE OP), Place 1 drop into both eyes daily as needed (redness)., Disp: , Rfl:  .  Tiotropium Bromide Monohydrate (SPIRIVA RESPIMAT) 1.25 MCG/ACT AERS, Inhale 1 puff into the lungs daily. (Patient not taking: Reported on 04/11/2019), Disp: 4 g, Rfl: 11 .  Tiotropium Bromide Monohydrate (SPIRIVA RESPIMAT) 1.25 MCG/ACT AERS, Inhale 2 puffs into the lungs daily. (Patient not taking: Reported on 04/11/2019), Disp: 4 g, Rfl: 0    [name], DO [facility] 04/11/2019 11:29 AM

## 2019-04-11 NOTE — Telephone Encounter (Signed)
Received request to check on Spiriva Respimat access for patient. Patient quoted copay was $500.  Findings of benefits investigation via test claims at [facility]:   Insurance: [employer]  Spiriva Respimat 1.25 - # 1 inhaler for a 1 month supply through patient's insurance is $ 0.00.  Called patient's [facility] pharmacy, they were able to also get a zero copay for medication. Rep advised that it appears patient had a deductible that now has been met. They will get medication ready for the patient.  11:31 AM Beatriz [facility], CPhT

## 2019-04-12 ENCOUNTER — Encounter: Payer: Self-pay | Admitting: Physician Assistant

## 2019-04-12 ENCOUNTER — Inpatient Hospital Stay: Payer: No Typology Code available for payment source | Attending: Internal Medicine | Admitting: Physician Assistant

## 2019-04-12 ENCOUNTER — Other Ambulatory Visit: Payer: Self-pay

## 2019-04-12 ENCOUNTER — Other Ambulatory Visit: Payer: Self-pay | Admitting: Internal Medicine

## 2019-04-12 ENCOUNTER — Telehealth: Payer: Self-pay | Admitting: Physician Assistant

## 2019-04-12 VITALS — BP 101/68 | HR 81 | Temp 98.2°F | Resp 18 | Ht 66.0 in | Wt 185.1 lb

## 2019-04-12 DIAGNOSIS — E785 Hyperlipidemia, unspecified: Secondary | ICD-10-CM | POA: Diagnosis not present

## 2019-04-12 DIAGNOSIS — C3411 Malignant neoplasm of upper lobe, right bronchus or lung: Secondary | ICD-10-CM | POA: Diagnosis not present

## 2019-04-12 DIAGNOSIS — K208 Other esophagitis without bleeding: Secondary | ICD-10-CM | POA: Insufficient documentation

## 2019-04-12 DIAGNOSIS — R079 Chest pain, unspecified: Secondary | ICD-10-CM | POA: Insufficient documentation

## 2019-04-12 DIAGNOSIS — Z9221 Personal history of antineoplastic chemotherapy: Secondary | ICD-10-CM | POA: Diagnosis not present

## 2019-04-12 DIAGNOSIS — Z923 Personal history of irradiation: Secondary | ICD-10-CM | POA: Diagnosis not present

## 2019-04-12 DIAGNOSIS — J439 Emphysema, unspecified: Secondary | ICD-10-CM | POA: Insufficient documentation

## 2019-04-12 DIAGNOSIS — D3502 Benign neoplasm of left adrenal gland: Secondary | ICD-10-CM | POA: Insufficient documentation

## 2019-04-12 DIAGNOSIS — Z7982 Long term (current) use of aspirin: Secondary | ICD-10-CM | POA: Diagnosis not present

## 2019-04-12 DIAGNOSIS — R61 Generalized hyperhidrosis: Secondary | ICD-10-CM | POA: Diagnosis not present

## 2019-04-12 DIAGNOSIS — C778 Secondary and unspecified malignant neoplasm of lymph nodes of multiple regions: Secondary | ICD-10-CM | POA: Insufficient documentation

## 2019-04-12 DIAGNOSIS — C3491 Malignant neoplasm of unspecified part of right bronchus or lung: Secondary | ICD-10-CM

## 2019-04-12 DIAGNOSIS — I313 Pericardial effusion (noninflammatory): Secondary | ICD-10-CM | POA: Diagnosis not present

## 2019-04-12 DIAGNOSIS — Z79899 Other long term (current) drug therapy: Secondary | ICD-10-CM | POA: Diagnosis not present

## 2019-04-12 DIAGNOSIS — Z5112 Encounter for antineoplastic immunotherapy: Secondary | ICD-10-CM | POA: Diagnosis present

## 2019-04-12 DIAGNOSIS — Z7189 Other specified counseling: Secondary | ICD-10-CM

## 2019-04-12 DIAGNOSIS — I700 Atherosclerosis of aorta: Secondary | ICD-10-CM | POA: Diagnosis not present

## 2019-04-12 NOTE — Telephone Encounter (Signed)
Scheduled per los. Gave avs and calendar  

## 2019-04-12 NOTE — Patient Instructions (Addendum)
-We covered a lot of important information at your appointment today regarding what the treatment plan is moving forward. Here are the the main points that were discussed at your office visit with Philip [name] today:  -The treatment will consist of a new medication. This is not chemotherapy. This new drug is a type of Immunotherapy called Imfinzi (Durvalumab).  -We are planning on starting your treatment next week on 04/19/19  -Your treatment will be given once every 4 weeks. You will receive this treatment every four weeks for a total of 1 year (12 total treatments) unless you experience unacceptable toxicity or if there is evidence on your routine CT scans that the cancer is growing  -We will get a CT scan after every 3 treatments to check on the progress of treatment   Side Effects:  -The adverse effect of the immunotherapy including but not limited to immunotherapy mediated skin rash, diarrhea, inflammation of the lung, kidney, liver, thyroid or other endocrine dysfunction  Follow up:  -We will see you back for a follow up visit in 2 for a one week follow up.   Durvalumab injection What is this medicine? DURVALUMAB (dur VAL ue mab) is a monoclonal antibody. It is used to treat urothelial cancer and lung cancer. This medicine may be used for other purposes; ask your health care provider or pharmacist if you have questions. COMMON BRAND NAME(S): IMFINZI What should I tell my health care provider before I take this medicine? They need to know if you have any of these conditions:  diabetes  immune system problems  infection  inflammatory bowel disease  kidney disease  liver disease  lung or breathing disease  lupus  organ transplant  stomach or intestine problems  thyroid disease  an unusual or allergic reaction to durvalumab, other medicines, foods, dyes, or preservatives  pregnant or trying to get pregnant  breast-feeding How should I use this medicine? This medicine is for  infusion into a vein. It is given by a health care professional in a hospital or clinic setting. A special MedGuide will be given to you before each treatment. Be sure to read this information carefully each time. Talk to your pediatrician regarding the use of this medicine in children. Special care may be needed. Overdosage: If you think you have taken too much of this medicine contact a poison control center or emergency room at once. NOTE: This medicine is only for you. Do not share this medicine with others. What if I miss a dose? It is important not to miss your dose. Call your doctor or health care professional if you are unable to keep an appointment. What may interact with this medicine? Interactions have not been studied. This list may not describe all possible interactions. Give your health care provider a list of all the medicines, herbs, non-prescription drugs, or dietary supplements you use. Also tell them if you smoke, drink alcohol, or use illegal drugs. Some items may interact with your medicine. What should I watch for while using this medicine? This drug may make you feel generally unwell. Continue your course of treatment even though you feel ill unless your doctor tells you to stop. You may need blood work done while you are taking this medicine. Do not become pregnant while taking this medicine or for 3 months after stopping it. Women should inform their doctor if they wish to become pregnant or think they might be pregnant. There is a potential for serious side effects to an unborn  child. Talk to your health care professional or pharmacist for more information. Do not breast-feed an infant while taking this medicine or for 3 months after stopping it. What side effects may I notice from receiving this medicine? Side effects that you should report to your doctor or health care professional as soon as possible:  allergic reactions like skin rash, itching or hives, swelling of the  face, lips, or tongue  black, tarry stools  bloody or watery diarrhea  breathing problems  change in emotions or moods  change in sex drive  changes in vision  chest pain or chest tightness  chills  confusion  cough  facial flushing  fever  headache  signs and symptoms of high blood sugar such as dizziness; dry mouth; dry skin; fruity breath; nausea; stomach pain; increased hunger or thirst; increased urination  signs and symptoms of liver injury like dark yellow or brown urine; general ill feeling or flu-like symptoms; light-colored stools; loss of appetite; nausea; right upper belly pain; unusually weak or tired; yellowing of the eyes or skin  stomach pain  trouble passing urine or change in the amount of urine  weight gain or weight loss Side effects that usually do not require medical attention (report these to your doctor or health care professional if they continue or are bothersome):  bone pain  constipation  loss of appetite  muscle pain  nausea  swelling of the ankles, feet, hands  tiredness This list may not describe all possible side effects. Call your doctor for medical advice about side effects. You may report side effects to FDA at 1-800-FDA-1088. Where should I keep my medicine? This drug is given in a hospital or clinic and will not be stored at home. NOTE: This sheet is a summary. It may not cover all possible information. If you have questions about this medicine, talk to your doctor, pharmacist, or health care provider.  2020 Elsevier/Gold Standard (2016-03-31 19:25:04)

## 2019-04-17 ENCOUNTER — Telehealth: Payer: Self-pay | Admitting: Medical Oncology

## 2019-04-17 NOTE — Telephone Encounter (Signed)
Appt confirmed

## 2019-04-19 ENCOUNTER — Other Ambulatory Visit: Payer: Self-pay

## 2019-04-19 ENCOUNTER — Inpatient Hospital Stay: Payer: No Typology Code available for payment source

## 2019-04-19 VITALS — BP 110/76 | HR 89 | Temp 97.8°F | Resp 16

## 2019-04-19 DIAGNOSIS — C3411 Malignant neoplasm of upper lobe, right bronchus or lung: Secondary | ICD-10-CM | POA: Diagnosis not present

## 2019-04-19 DIAGNOSIS — C3491 Malignant neoplasm of unspecified part of right bronchus or lung: Secondary | ICD-10-CM

## 2019-04-19 LAB — CBC WITH DIFFERENTIAL (CANCER CENTER ONLY)
Abs Immature Granulocytes: 0.03 10*3/uL (ref 0.00–0.07)
Basophils Absolute: 0 10*3/uL (ref 0.0–0.1)
Basophils Relative: 0 %
Eosinophils Absolute: 0.2 10*3/uL (ref 0.0–0.5)
Eosinophils Relative: 3 %
HCT: 38.7 % — ABNORMAL LOW (ref 39.0–52.0)
Hemoglobin: 13.5 g/dL (ref 13.0–17.0)
Immature Granulocytes: 0 %
Lymphocytes Relative: 7 %
Lymphs Abs: 0.5 10*3/uL — ABNORMAL LOW (ref 0.7–4.0)
MCH: 32.5 pg (ref 26.0–34.0)
MCHC: 34.9 g/dL (ref 30.0–36.0)
MCV: 93.3 fL (ref 80.0–100.0)
Monocytes Absolute: 0.8 10*3/uL (ref 0.1–1.0)
Monocytes Relative: 11 %
Neutro Abs: 5.6 10*3/uL (ref 1.7–7.7)
Neutrophils Relative %: 79 %
Platelet Count: 223 10*3/uL (ref 150–400)
RBC: 4.15 MIL/uL — ABNORMAL LOW (ref 4.22–5.81)
RDW: 14.4 % (ref 11.5–15.5)
WBC Count: 7.3 10*3/uL (ref 4.0–10.5)
nRBC: 0 % (ref 0.0–0.2)

## 2019-04-19 LAB — CMP (CANCER CENTER ONLY)
ALT: 32 U/L (ref 0–44)
AST: 27 U/L (ref 15–41)
Albumin: 3.3 g/dL — ABNORMAL LOW (ref 3.5–5.0)
Alkaline Phosphatase: 109 U/L (ref 38–126)
Anion gap: 9 (ref 5–15)
BUN: 12 mg/dL (ref 8–23)
CO2: 22 mmol/L (ref 22–32)
Calcium: 8.9 mg/dL (ref 8.9–10.3)
Chloride: 106 mmol/L (ref 98–111)
Creatinine: 0.86 mg/dL (ref 0.61–1.24)
GFR, Est AFR Am: >60 mL/min (ref >60)
GFR, Estimated: >60 mL/min (ref >60)
Glucose, Bld: 125 mg/dL — ABNORMAL HIGH (ref 70–99)
Potassium: 4.2 mmol/L (ref 3.5–5.1)
Sodium: 137 mmol/L (ref 135–145)
Total Bilirubin: 0.5 mg/dL (ref 0.3–1.2)
Total Protein: 6.9 g/dL (ref 6.5–8.1)

## 2019-04-19 LAB — TSH: TSH: 1.711 u[IU]/mL (ref 0.320–4.118)

## 2019-04-19 MED ORDER — SODIUM CHLORIDE 0.9 % IV SOLN
Freq: Once | INTRAVENOUS | Status: AC
  Filled 2019-04-19: qty 250

## 2019-04-19 MED ORDER — SODIUM CHLORIDE 0.9 % IV SOLN
1500.0000 mg | Freq: Once | INTRAVENOUS | Status: AC
  Administered 2019-04-19: 1500 mg via INTRAVENOUS
  Filled 2019-04-19: qty 30

## 2019-04-19 NOTE — Progress Notes (Signed)
Per Dr. Julien Nordmann, patient is cleared to receive COVID vaccine when he is eligible. Patient aware.

## 2019-04-19 NOTE — Patient Instructions (Addendum)
[facility] Discharge Instructions for Patients Receiving Chemotherapy  Today you received the following chemotherapy agents: durvalumab (Imfinzi).  To help prevent nausea and vomiting after your treatment, we encourage you to take your nausea medication as directed.   If you develop nausea and vomiting that is not controlled by your nausea medication, call the clinic.   BELOW ARE SYMPTOMS THAT SHOULD BE REPORTED IMMEDIATELY:  *FEVER GREATER THAN 100.5 F  *CHILLS WITH OR WITHOUT FEVER  NAUSEA AND VOMITING THAT IS NOT CONTROLLED WITH YOUR NAUSEA MEDICATION  *UNUSUAL SHORTNESS OF BREATH  *UNUSUAL BRUISING OR BLEEDING  TENDERNESS IN MOUTH AND THROAT WITH OR WITHOUT PRESENCE OF ULCERS  *URINARY PROBLEMS  *BOWEL PROBLEMS  UNUSUAL RASH Items with * indicate a potential emergency and should be followed up as soon as possible.  Feel free to call the clinic should you have any questions or concerns. The clinic phone number is [phone].  Please show the [facility] at check-in to the Emergency Department and triage nurse.  Durvalumab injection What is this medicine? DURVALUMAB (dur VAL ue mab) is a monoclonal antibody. It is used to treat urothelial cancer and lung cancer. This medicine may be used for other purposes; ask your health care provider or pharmacist if you have questions. COMMON BRAND NAME(S): IMFINZI What should I tell my health care provider before I take this medicine? They need to know if you have any of these conditions:  diabetes  immune system problems  infection  inflammatory bowel disease  kidney disease  liver disease  lung or breathing disease  lupus  organ transplant  stomach or intestine problems  thyroid disease  an unusual or allergic reaction to durvalumab, other medicines, foods, dyes, or preservatives  pregnant or trying to get pregnant  breast-feeding How should I use this medicine? This medicine is  for infusion into a vein. It is given by a health care professional in a hospital or clinic setting. A special MedGuide will be given to you before each treatment. Be sure to read this information carefully each time. Talk to your pediatrician regarding the use of this medicine in children. Special care may be needed. Overdosage: If you think you have taken too much of this medicine contact a poison control center or emergency room at once. NOTE: This medicine is only for you. Do not share this medicine with others. What if I miss a dose? It is important not to miss your dose. Call your doctor or health care professional if you are unable to keep an appointment. What may interact with this medicine? Interactions have not been studied. This list may not describe all possible interactions. Give your health care provider a list of all the medicines, herbs, non-prescription drugs, or dietary supplements you use. Also tell them if you smoke, drink alcohol, or use illegal drugs. Some items may interact with your medicine. What should I watch for while using this medicine? This drug may make you feel generally unwell. Continue your course of treatment even though you feel ill unless your doctor tells you to stop. You may need blood work done while you are taking this medicine. Do not become pregnant while taking this medicine or for 3 months after stopping it. Women should inform their doctor if they wish to become pregnant or think they might be pregnant. There is a potential for serious side effects to an unborn child. Talk to your health care professional or pharmacist for more information. Do not  breast-feed an infant while taking this medicine or for 3 months after stopping it. What side effects may I notice from receiving this medicine? Side effects that you should report to your doctor or health care professional as soon as possible:  allergic reactions like skin rash, itching or hives, swelling of  the face, lips, or tongue  black, tarry stools  bloody or watery diarrhea  breathing problems  change in emotions or moods  change in sex drive  changes in vision  chest pain or chest tightness  chills  confusion  cough  facial flushing  fever  headache  signs and symptoms of high blood sugar such as dizziness; dry mouth; dry skin; fruity breath; nausea; stomach pain; increased hunger or thirst; increased urination  signs and symptoms of liver injury like dark yellow or brown urine; general ill feeling or flu-like symptoms; light-colored stools; loss of appetite; nausea; right upper belly pain; unusually weak or tired; yellowing of the eyes or skin  stomach pain  trouble passing urine or change in the amount of urine  weight gain or weight loss Side effects that usually do not require medical attention (report these to your doctor or health care professional if they continue or are bothersome):  bone pain  constipation  loss of appetite  muscle pain  nausea  swelling of the ankles, feet, hands  tiredness This list may not describe all possible side effects. Call your doctor for medical advice about side effects. You may report side effects to FDA at 1-800-FDA-1088. Where should I keep my medicine? This drug is given in a hospital or clinic and will not be stored at home. NOTE: This sheet is a summary. It may not cover all possible information. If you have questions about this medicine, talk to your doctor, pharmacist, or health care provider.  2020 Elsevier/Gold Standard (2016-03-31 19:25:04)

## 2019-04-20 ENCOUNTER — Telehealth: Payer: Self-pay | Admitting: *Deleted

## 2019-04-26 ENCOUNTER — Telehealth: Payer: Self-pay | Admitting: Internal Medicine

## 2019-04-26 ENCOUNTER — Encounter: Payer: Self-pay | Admitting: Internal Medicine

## 2019-04-26 ENCOUNTER — Inpatient Hospital Stay (HOSPITAL_BASED_OUTPATIENT_CLINIC_OR_DEPARTMENT_OTHER): Payer: No Typology Code available for payment source | Admitting: Internal Medicine

## 2019-04-26 ENCOUNTER — Other Ambulatory Visit: Payer: Self-pay

## 2019-04-26 VITALS — BP 111/79 | HR 93 | Temp 98.8°F | Resp 18 | Ht 66.0 in | Wt 180.5 lb

## 2019-04-26 DIAGNOSIS — C3411 Malignant neoplasm of upper lobe, right bronchus or lung: Secondary | ICD-10-CM | POA: Diagnosis not present

## 2019-04-26 DIAGNOSIS — Z5112 Encounter for antineoplastic immunotherapy: Secondary | ICD-10-CM | POA: Diagnosis not present

## 2019-04-26 DIAGNOSIS — Z7189 Other specified counseling: Secondary | ICD-10-CM | POA: Diagnosis not present

## 2019-04-26 DIAGNOSIS — C3491 Malignant neoplasm of unspecified part of right bronchus or lung: Secondary | ICD-10-CM

## 2019-04-26 NOTE — Progress Notes (Signed)
Philip [name] [phone]   [phone]  OFFICE PROGRESS NOTE  Philip Pepper, MD [place] [address] [place] [address]  DIAGNOSIS: Stage IIIc (T4,N3, M0)non-small cell lung cancer, adenocarcinoma presented with large right hilar mass in addition to bilateral hilar and mediastinal lymphadenopathy diagnosed in December 2020.  PRIOR THERAPY: Weekly concurrent chemoradiation with carboplatin for an AUC of 2 and paclitaxel 45 mg/m2. First dose starting 01/30/2019.   Status post 6 cycles.  CURRENT THERAPY: Consolidation immunotherapy with Imfinzi 1500 mg IV every 4 weeks. First dose expected on 04/19/2019.  Status post 1 cycle.  INTERVAL HISTORY: Philip [name] 64 y.o. male returns to the clinic today for follow-up visit accompanied by his wife.  The patient is feeling fine today with no concerning complaints except for occasional sharp pain in the chest improved with antiacid.  The patient also has shortness of breath with exertion.  He lost few pounds since his last visit.  He denied having any current fever or chills.  He has no nausea, vomiting, diarrhea or constipation.  He denied having any headache or visual changes.  He is here today for evaluation after the first dose of his treatment with Imfinzi.   MEDICAL HISTORY: Past Medical History:  Diagnosis Date  . Hyperlipidemia   . Pneumonia   . Tobacco abuse     ALLERGIES:  is allergic to norco [hydrocodone-acetaminophen].  MEDICATIONS:  Current Outpatient Medications  Medication Sig Dispense Refill  . albuterol (VENTOLIN HFA) 108 (90 Base) MCG/ACT inhaler Inhale 2 puffs into the lungs every 4 (four) hours as needed for wheezing or shortness of breath. 18 g 6  . aspirin 325 MG tablet Take 650 mg by mouth daily as needed for moderate pain.    . Aspirin-Acetaminophen-Caffeine (GOODY HEADACHE PO) Take 1 packet by mouth daily as needed (headaches).    Marland Kitchen atorvastatin (LIPITOR) 20  MG tablet Take 20 mg by mouth daily.    Marland Kitchen bismuth subsalicylate (PEPTO BISMOL) 262 MG/15ML suspension Take 30 mLs by mouth every 6 (six) hours as needed for indigestion.    . lidocaine (XYLOCAINE) 2 % solution Use as directed 15 mLs in the mouth or throat 4 (four) times daily -  before meals and at bedtime. 20 minutes prior to meals 100 mL 2  . prochlorperazine (COMPAZINE) 10 MG tablet Take 1 tablet (10 mg total) by mouth every 6 (six) hours as needed for nausea or vomiting. 30 tablet 2  . sildenafil (REVATIO) 20 MG tablet Take 20-60 mg by mouth daily as needed (ED).     . sucralfate (CARAFATE) 1 g tablet Take 1 tablet (1 g total) by mouth 4 (four) times daily -  with meals and at bedtime. Dissolve in 10 mL of warm water prior to swallowing 120 tablet 1  . Tetrahydrozoline HCl (VISINE OP) Place 1 drop into both eyes daily as needed (redness).    . Tiotropium Bromide Monohydrate (SPIRIVA RESPIMAT) 1.25 MCG/ACT AERS Inhale 1 puff into the lungs daily. (Patient not taking: Reported on 04/11/2019) 4 g 11  . Tiotropium Bromide Monohydrate (SPIRIVA RESPIMAT) 1.25 MCG/ACT AERS Inhale 2 puffs into the lungs daily. (Patient not taking: Reported on 04/11/2019) 4 g 0   No current facility-administered medications for this visit.    SURGICAL HISTORY:  Past Surgical History:  Procedure Laterality Date  . COLONOSCOPY    . HAND SURGERY    . VIDEO BRONCHOSCOPY WITH ENDOBRONCHIAL ULTRASOUND N/A 12/22/2018  Procedure: VIDEO BRONCHOSCOPY WITH ENDOBRONCHIAL ULTRASOUND;  Surgeon: Garner Nash, DO;  Location: [facility];  Service: Thoracic;  Laterality: N/A;  . VIDEO BRONCHOSCOPY WITH ENDOBRONCHIAL ULTRASOUND N/A 01/09/2019   Procedure: VIDEO BRONCHOSCOPY WITH ENDOBRONCHIAL ULTRASOUND WITH FLUORO;  Surgeon: Garner Nash, DO;  Location: [facility];  Service: Thoracic;  Laterality: N/A;  . VIDEO BRONCHOSCOPY WITH RADIAL ENDOBRONCHIAL ULTRASOUND N/A 12/22/2018   Procedure: RADIAL ENDOBRONCHIAL ULTRASOUND;  Surgeon: Garner Nash, DO;  Location: [facility];  Service: Thoracic;  Laterality: N/A;    REVIEW OF SYSTEMS:  A comprehensive review of systems was negative except for: Constitutional: positive for fatigue Respiratory: positive for dyspnea on exertion Gastrointestinal: positive for constipation and dyspepsia   PHYSICAL EXAMINATION: General appearance: alert, cooperative and no distress Head: Normocephalic, without obvious abnormality, atraumatic Neck: no adenopathy, no JVD, supple, symmetrical, trachea midline and thyroid not enlarged, symmetric, no tenderness/mass/nodules Lymph nodes: Cervical, supraclavicular, and axillary nodes normal. Resp: clear to auscultation bilaterally Back: symmetric, no curvature. ROM normal. No CVA tenderness. Cardio: regular rate and rhythm, S1, S2 normal, no murmur, click, rub or gallop GI: soft, non-tender; bowel sounds normal; no masses,  no organomegaly Extremities: extremities normal, atraumatic, no cyanosis or edema  ECOG PERFORMANCE STATUS: 1 - Symptomatic but completely ambulatory  Blood pressure 111/79, pulse 93, temperature 98.8 F (37.1 C), temperature source Oral, resp. rate 18, height 5\' 6"  (1.676 m), weight 180 lb 8 oz (81.9 kg), SpO2 98 %.  LABORATORY DATA: Lab Results  Component Value Date   WBC 7.3 04/19/2019   HGB 13.5 04/19/2019   HCT 38.7 (L) 04/19/2019   MCV 93.3 04/19/2019   PLT 223 04/19/2019      Chemistry      Component Value Date/Time   NA 137 04/19/2019 0750   K 4.2 04/19/2019 0750   CL 106 04/19/2019 0750   CO2 22 04/19/2019 0750   BUN 12 04/19/2019 0750   CREATININE 0.86 04/19/2019 0750      Component Value Date/Time   CALCIUM 8.9 04/19/2019 0750   ALKPHOS 109 04/19/2019 0750   AST 27 04/19/2019 0750   ALT 32 04/19/2019 0750   BILITOT 0.5 04/19/2019 0750       RADIOGRAPHIC STUDIES: CT Chest W Contrast  Result Date: 04/10/2019 CLINICAL DATA:  Restaging of lung cancer. Post radiation and chemotherapy with last radiation  treatment on 03/08/2019 EXAM: CT CHEST WITH CONTRAST TECHNIQUE: Multidetector CT imaging of the chest was performed during intravenous contrast administration. CONTRAST:  46mL OMNIPAQUE IOHEXOL 300 MG/ML  SOLN COMPARISON:  12/09/2018 FINDINGS: Cardiovascular: Calcified atherosclerotic changes scattered about the thoracic aorta. Heart size is stable. Small pericardial effusion. Central pulmonary arteries are normal. Distortion of right hilar structures following radiotherapy, see below. Mediastinum/Nodes: Decreased size of AP (image 42, series 2) 12 mm as compared to 16 mm on the prior study. Decreased size of right paratracheal lymph node measuring 7 mm as compared to 13 mm (image 43, series 2) similar decrease in size of other mediastinal lymph nodes. Stable mild enlargement of subcarinal lymph nodes at 9 mm greatest short axis dimension. No signs of axillary lymphadenopathy. Fullness of the right hilum similar to the prior exam. No signs of left hilar adenopathy with borderline enlarged lymph nodes in this location largest measuring approximately 7 mm. Esophagus is normal. Thoracic inlet structures are unremarkable. Lungs/Pleura: Masslike area in the right hilum measures approximately 3.8 x 3.0 cm in greatest axial dimension at the level of the hilum, unchanged compared to  the PET-CT that was acquired on 01/03/2019. Infrahilar soft tissue is also unchanged. Volume loss in the right lower lobe showing less nodularity at the periphery and with diminished size of nodule along the anterior margin of post treatment changes (image 81, series 5) approximately 7 mm. This area measured approximately 10 mm on the prior exam. Background of pulmonary emphysema is moderate to marked and worse towards the lung apices. No signs of pleural effusion. No signs of pleural nodularity. Upper Abdomen: Normal appearance of visualized liver. Spleen is normal size. Left adrenal nodule compatible with adrenal adenoma unchanged. Thickening  of right adrenal also with benign appearance is stable Musculoskeletal: IMPRESSION: 1. Stable appearance of right perihilar mass and right perihilar soft tissue. Residual viable tumor in this location is not excluded, attention on follow-up given the convex margins extending into the adjacent lung parenchyma. This is particularly true on image 47 at the level of the hilum. 2. Decreased size of mediastinal lymph nodes. 3. Decreased size of nodule along the anterior margin of the right lower lobe post treatment changes. 4. Stable left adrenal adenoma. Aortic Atherosclerosis (ICD10-I70.0). Electronically Signed   By: Zetta Bills M.D.   On: 04/10/2019 18:05    ASSESSMENT AND PLAN: This is a very pleasant 64 years old white male with a stage IIIc non-small cell lung cancer, adenocarcinoma diagnosed in December 2020 The patient completed a course of concurrent chemoradiation with weekly carboplatin and paclitaxel status post 6 cycles and he tolerated his treatment well and has partial response. He is currently undergoing consolidation treatment with immunotherapy with Imfinzi 1500 mg IV every 4 weeks status post 1 cycle giving last week. He tolerated the first week of his treatment fairly well with no concerning adverse effects. I recommended for the patient to continue his current treatment with Imfinzi as planned and he will come back for follow-up visit in 3 weeks for evaluation before starting cycle #2. He was advised to call immediately if he has any concerning symptoms in the interval. The patient voices understanding of current disease status and treatment options and is in agreement with the current care plan.  All questions were answered. The patient knows to call the clinic with any problems, questions or concerns. We can certainly see the patient much sooner if necessary.  Disclaimer: This note was dictated with voice recognition software. Similar sounding words can inadvertently be transcribed  and may not be corrected upon review.

## 2019-04-26 NOTE — Telephone Encounter (Signed)
Scheduled per los. Patient declined printout  

## 2019-05-11 NOTE — Progress Notes (Signed)
Pharmacist Chemotherapy Monitoring - Follow Up Assessment    I verify that I have reviewed each item in the below checklist:  . Regimen for the patient is scheduled for the appropriate day and plan matches scheduled date. Marland Kitchen Appropriate non-routine labs are ordered dependent on drug ordered. . If applicable, additional medications reviewed and ordered per protocol based on lifetime cumulative doses and/or treatment regimen.   Plan for follow-up and/or issues identified: No . I-vent associated with next due treatment: No . MD and/or nursing notified: No  Philip [name],Philip [name] 05/11/2019 10:59 AM

## 2019-05-17 ENCOUNTER — Inpatient Hospital Stay: Payer: No Typology Code available for payment source

## 2019-05-17 ENCOUNTER — Encounter: Payer: Self-pay | Admitting: Internal Medicine

## 2019-05-17 ENCOUNTER — Inpatient Hospital Stay: Payer: No Typology Code available for payment source | Attending: Internal Medicine | Admitting: Internal Medicine

## 2019-05-17 ENCOUNTER — Other Ambulatory Visit: Payer: Self-pay

## 2019-05-17 VITALS — BP 110/74 | HR 72 | Temp 97.8°F | Resp 18 | Ht 66.0 in | Wt 183.9 lb

## 2019-05-17 DIAGNOSIS — C3491 Malignant neoplasm of unspecified part of right bronchus or lung: Secondary | ICD-10-CM

## 2019-05-17 DIAGNOSIS — Z79899 Other long term (current) drug therapy: Secondary | ICD-10-CM | POA: Diagnosis not present

## 2019-05-17 DIAGNOSIS — Z5112 Encounter for antineoplastic immunotherapy: Secondary | ICD-10-CM | POA: Insufficient documentation

## 2019-05-17 DIAGNOSIS — Z9221 Personal history of antineoplastic chemotherapy: Secondary | ICD-10-CM | POA: Diagnosis not present

## 2019-05-17 DIAGNOSIS — C3401 Malignant neoplasm of right main bronchus: Secondary | ICD-10-CM | POA: Insufficient documentation

## 2019-05-17 DIAGNOSIS — Z7982 Long term (current) use of aspirin: Secondary | ICD-10-CM | POA: Diagnosis not present

## 2019-05-17 DIAGNOSIS — E785 Hyperlipidemia, unspecified: Secondary | ICD-10-CM | POA: Diagnosis not present

## 2019-05-17 DIAGNOSIS — F17210 Nicotine dependence, cigarettes, uncomplicated: Secondary | ICD-10-CM | POA: Diagnosis not present

## 2019-05-17 LAB — CBC WITH DIFFERENTIAL (CANCER CENTER ONLY)
Abs Immature Granulocytes: 0.08 10*3/uL — ABNORMAL HIGH (ref 0.00–0.07)
Basophils Absolute: 0.1 10*3/uL (ref 0.0–0.1)
Basophils Relative: 1 %
Eosinophils Absolute: 0.2 10*3/uL (ref 0.0–0.5)
Eosinophils Relative: 2 %
HCT: 39.9 % (ref 39.0–52.0)
Hemoglobin: 13.7 g/dL (ref 13.0–17.0)
Immature Granulocytes: 1 %
Lymphocytes Relative: 11 %
Lymphs Abs: 0.9 10*3/uL (ref 0.7–4.0)
MCH: 31.9 pg (ref 26.0–34.0)
MCHC: 34.3 g/dL (ref 30.0–36.0)
MCV: 92.8 fL (ref 80.0–100.0)
Monocytes Absolute: 0.8 10*3/uL (ref 0.1–1.0)
Monocytes Relative: 10 %
Neutro Abs: 6.4 10*3/uL (ref 1.7–7.7)
Neutrophils Relative %: 75 %
Platelet Count: 216 10*3/uL (ref 150–400)
RBC: 4.3 MIL/uL (ref 4.22–5.81)
RDW: 13.2 % (ref 11.5–15.5)
WBC Count: 8.5 10*3/uL (ref 4.0–10.5)
nRBC: 0 % (ref 0.0–0.2)

## 2019-05-17 LAB — CMP (CANCER CENTER ONLY)
ALT: 33 U/L (ref 0–44)
AST: 22 U/L (ref 15–41)
Albumin: 3.4 g/dL — ABNORMAL LOW (ref 3.5–5.0)
Alkaline Phosphatase: 105 U/L (ref 38–126)
Anion gap: 11 (ref 5–15)
BUN: 21 mg/dL (ref 8–23)
CO2: 21 mmol/L — ABNORMAL LOW (ref 22–32)
Calcium: 9.4 mg/dL (ref 8.9–10.3)
Chloride: 108 mmol/L (ref 98–111)
Creatinine: 1.09 mg/dL (ref 0.61–1.24)
GFR, Est AFR Am: >60 mL/min (ref >60)
GFR, Estimated: >60 mL/min (ref >60)
Glucose, Bld: 123 mg/dL — ABNORMAL HIGH (ref 70–99)
Potassium: 3.9 mmol/L (ref 3.5–5.1)
Sodium: 140 mmol/L (ref 135–145)
Total Bilirubin: 0.4 mg/dL (ref 0.3–1.2)
Total Protein: 7.2 g/dL (ref 6.5–8.1)

## 2019-05-17 LAB — TSH: TSH: 1.41 u[IU]/mL (ref 0.320–4.118)

## 2019-05-17 MED ORDER — SODIUM CHLORIDE 0.9 % IV SOLN
1500.0000 mg | Freq: Once | INTRAVENOUS | Status: AC
  Administered 2019-05-17: 1500 mg via INTRAVENOUS
  Filled 2019-05-17: qty 30

## 2019-05-17 MED ORDER — SODIUM CHLORIDE 0.9 % IV SOLN
Freq: Once | INTRAVENOUS | Status: AC
  Filled 2019-05-17: qty 250

## 2019-05-17 NOTE — Patient Instructions (Signed)
[facility] Discharge Instructions for Patients Receiving Chemotherapy  Today you received the following chemotherapy agents: durvalumab (Imfinzi).  To help prevent nausea and vomiting after your treatment, we encourage you to take your nausea medication as directed.   If you develop nausea and vomiting that is not controlled by your nausea medication, call the clinic.   BELOW ARE SYMPTOMS THAT SHOULD BE REPORTED IMMEDIATELY:  *FEVER GREATER THAN 100.5 F  *CHILLS WITH OR WITHOUT FEVER  NAUSEA AND VOMITING THAT IS NOT CONTROLLED WITH YOUR NAUSEA MEDICATION  *UNUSUAL SHORTNESS OF BREATH  *UNUSUAL BRUISING OR BLEEDING  TENDERNESS IN MOUTH AND THROAT WITH OR WITHOUT PRESENCE OF ULCERS  *URINARY PROBLEMS  *BOWEL PROBLEMS  UNUSUAL RASH Items with * indicate a potential emergency and should be followed up as soon as possible.  Feel free to call the clinic should you have any questions or concerns. The clinic phone number is [phone].  Please show the [facility] at check-in to the Emergency Department and triage nurse.

## 2019-05-17 NOTE — Progress Notes (Signed)
[facility] [phone]   Fax:([phone]  OFFICE PROGRESS NOTE  [name], MD [place] [address] [place] [address]  DIAGNOSIS: Stage IIIc (T4,N3, M0)non-small cell lung cancer, adenocarcinoma presented with large right hilar mass in addition to bilateral hilar and mediastinal lymphadenopathy diagnosed in December 2020.  PRIOR THERAPY: Weekly concurrent chemoradiation with carboplatin for an AUC of 2 and paclitaxel 45 mg/m2. First dose starting 01/30/2019.   Status post 6 cycles.  CURRENT THERAPY: Consolidation immunotherapy with Imfinzi 1500 mg IV every 4 weeks. First dose expected on 04/19/2019.  Status post 1 cycle.  INTERVAL HISTORY: Philip [name] 64 y.o. male returns to the clinic today for follow-up visit.  The patient is feeling fine today with no concerning complaints except for the baseline shortness of breath and mild cough.  He has 1 dizzy spell recently.  He completed his Covid vaccination.  He denied having any current chest pain or hemoptysis.  He has no nausea, vomiting, diarrhea or constipation.  He has no recent weight loss or night sweats.  He has no fever or chills.  He is here today for evaluation before starting cycle #2 of his consolidation treatment with Imfinzi.   MEDICAL HISTORY: Past Medical History:  Diagnosis Date  . Hyperlipidemia   . Pneumonia   . Tobacco abuse     ALLERGIES:  is allergic to norco [hydrocodone-acetaminophen].  MEDICATIONS:  Current Outpatient Medications  Medication Sig Dispense Refill  . albuterol (VENTOLIN HFA) 108 (90 Base) MCG/ACT inhaler Inhale 2 puffs into the lungs every 4 (four) hours as needed for wheezing or shortness of breath. 18 g 6  . aspirin 325 MG tablet Take 650 mg by mouth daily as needed for moderate pain.    . Aspirin-Acetaminophen-Caffeine (GOODY HEADACHE PO) Take 1 packet by mouth daily as needed (headaches).    Marland Kitchen atorvastatin (LIPITOR) 20 MG tablet  Take 20 mg by mouth daily.    Marland Kitchen bismuth subsalicylate (PEPTO BISMOL) 262 MG/15ML suspension Take 30 mLs by mouth every 6 (six) hours as needed for indigestion.    . famotidine (PEPCID) 20 MG tablet Take 20 mg by mouth 2 (two) times daily.    Marland Kitchen lidocaine (XYLOCAINE) 2 % solution Use as directed 15 mLs in the mouth or throat 4 (four) times daily -  before meals and at bedtime. 20 minutes prior to meals (Patient not taking: Reported on 04/26/2019) 100 mL 2  . prochlorperazine (COMPAZINE) 10 MG tablet Take 1 tablet (10 mg total) by mouth every 6 (six) hours as needed for nausea or vomiting. (Patient not taking: Reported on 04/26/2019) 30 tablet 2  . sildenafil (REVATIO) 20 MG tablet Take 20-60 mg by mouth daily as needed (ED).     . sucralfate (CARAFATE) 1 g tablet Take 1 tablet (1 g total) by mouth 4 (four) times daily -  with meals and at bedtime. Dissolve in 10 mL of warm water prior to swallowing 120 tablet 1  . Tetrahydrozoline HCl (VISINE OP) Place 1 drop into both eyes daily as needed (redness).    . Tiotropium Bromide Monohydrate (SPIRIVA RESPIMAT) 1.25 MCG/ACT AERS Inhale 1 puff into the lungs daily. (Patient not taking: Reported on 04/11/2019) 4 g 11  . Tiotropium Bromide Monohydrate (SPIRIVA RESPIMAT) 1.25 MCG/ACT AERS Inhale 2 puffs into the lungs daily. (Patient not taking: Reported on 04/11/2019) 4 g 0   No current facility-administered medications for this visit.    SURGICAL HISTORY:  Past Surgical History:  Procedure Laterality Date  . COLONOSCOPY    . HAND SURGERY    . VIDEO BRONCHOSCOPY WITH ENDOBRONCHIAL ULTRASOUND N/A 12/22/2018   Procedure: VIDEO BRONCHOSCOPY WITH ENDOBRONCHIAL ULTRASOUND;  Surgeon: Garner Nash, DO;  Location: [facility];  Service: Thoracic;  Laterality: N/A;  . VIDEO BRONCHOSCOPY WITH ENDOBRONCHIAL ULTRASOUND N/A 01/09/2019   Procedure: VIDEO BRONCHOSCOPY WITH ENDOBRONCHIAL ULTRASOUND WITH FLUORO;  Surgeon: Garner Nash, DO;  Location: [facility];  Service: Thoracic;   Laterality: N/A;  . VIDEO BRONCHOSCOPY WITH RADIAL ENDOBRONCHIAL ULTRASOUND N/A 12/22/2018   Procedure: RADIAL ENDOBRONCHIAL ULTRASOUND;  Surgeon: Garner Nash, DO;  Location: [facility];  Service: Thoracic;  Laterality: N/A;    REVIEW OF SYSTEMS:  A comprehensive review of systems was negative except for: Respiratory: positive for cough and dyspnea on exertion   PHYSICAL EXAMINATION: General appearance: alert, cooperative and no distress Head: Normocephalic, without obvious abnormality, atraumatic Neck: no adenopathy, no JVD, supple, symmetrical, trachea midline and thyroid not enlarged, symmetric, no tenderness/mass/nodules Lymph nodes: Cervical, supraclavicular, and axillary nodes normal. Resp: clear to auscultation bilaterally Back: symmetric, no curvature. ROM normal. No CVA tenderness. Cardio: regular rate and rhythm, S1, S2 normal, no murmur, click, rub or gallop GI: soft, non-tender; bowel sounds normal; no masses,  no organomegaly Extremities: extremities normal, atraumatic, no cyanosis or edema  ECOG PERFORMANCE STATUS: 1 - Symptomatic but completely ambulatory  Blood pressure 110/74, pulse 72, temperature 97.8 F (36.6 C), temperature source Oral, resp. rate 18, height 5\' 6"  (1.676 m), weight 183 lb 14.4 oz (83.4 kg), SpO2 92 %.  LABORATORY DATA: Lab Results  Component Value Date   WBC 8.5 05/17/2019   HGB 13.7 05/17/2019   HCT 39.9 05/17/2019   MCV 92.8 05/17/2019   PLT 216 05/17/2019      Chemistry      Component Value Date/Time   NA 137 04/19/2019 0750   K 4.2 04/19/2019 0750   CL 106 04/19/2019 0750   CO2 22 04/19/2019 0750   BUN 12 04/19/2019 0750   CREATININE 0.86 04/19/2019 0750      Component Value Date/Time   CALCIUM 8.9 04/19/2019 0750   ALKPHOS 109 04/19/2019 0750   AST 27 04/19/2019 0750   ALT 32 04/19/2019 0750   BILITOT 0.5 04/19/2019 0750       RADIOGRAPHIC STUDIES: No results found.  ASSESSMENT AND PLAN: This is a very pleasant 64 years old white male with a stage IIIc non-small cell lung cancer, adenocarcinoma diagnosed in December 2020 The patient completed a course of concurrent chemoradiation with weekly carboplatin and paclitaxel status post 6 cycles and he tolerated his treatment well and has partial response. He is currently undergoing consolidation treatment with immunotherapy with Imfinzi 1500 mg IV every 4 weeks status post 1 cycle. The patient continues to tolerate this treatment well with no concerning adverse effects. I recommended for him to proceed with cycle #2 today as planned. I will see him back for follow-up visit in 4 weeks for evaluation before starting cycle #3. He was advised to call immediately if he has any concerning symptoms in the interval. The patient voices understanding of current disease status and treatment options and is in agreement with the current care plan.  All questions were answered. The patient knows to call the clinic with any problems, questions or concerns. We can certainly see the patient much sooner if necessary.  Disclaimer: This note was dictated with voice recognition software. Similar sounding words can  inadvertently be transcribed and may not be corrected upon review.

## 2019-05-19 ENCOUNTER — Telehealth: Payer: Self-pay | Admitting: Internal Medicine

## 2019-05-19 NOTE — Telephone Encounter (Signed)
Scheduled per los. Called and left msg. Mailed printout  °

## 2019-06-08 NOTE — Progress Notes (Signed)
Pharmacist Chemotherapy Monitoring - Follow Up Assessment    I verify that I have reviewed each item in the below checklist:  . Regimen for the patient is scheduled for the appropriate day and plan matches scheduled date. Marland Kitchen Appropriate non-routine labs are ordered dependent on drug ordered. . If applicable, additional medications reviewed and ordered per protocol based on lifetime cumulative doses and/or treatment regimen.   Plan for follow-up and/or issues identified: No . I-vent associated with next due treatment: No . MD and/or nursing notified: No  Philip [name],Philip [name] 06/08/2019 11:00 AM

## 2019-06-14 ENCOUNTER — Encounter: Payer: Self-pay | Admitting: Internal Medicine

## 2019-06-14 ENCOUNTER — Other Ambulatory Visit: Payer: Self-pay

## 2019-06-14 ENCOUNTER — Inpatient Hospital Stay: Payer: No Typology Code available for payment source

## 2019-06-14 ENCOUNTER — Inpatient Hospital Stay: Payer: No Typology Code available for payment source | Attending: Internal Medicine | Admitting: Internal Medicine

## 2019-06-14 VITALS — BP 114/79 | HR 97 | Temp 97.8°F | Resp 20 | Ht 66.0 in | Wt 180.7 lb

## 2019-06-14 DIAGNOSIS — I2699 Other pulmonary embolism without acute cor pulmonale: Secondary | ICD-10-CM | POA: Diagnosis not present

## 2019-06-14 DIAGNOSIS — C3491 Malignant neoplasm of unspecified part of right bronchus or lung: Secondary | ICD-10-CM

## 2019-06-14 DIAGNOSIS — C3490 Malignant neoplasm of unspecified part of unspecified bronchus or lung: Secondary | ICD-10-CM

## 2019-06-14 DIAGNOSIS — Z5112 Encounter for antineoplastic immunotherapy: Secondary | ICD-10-CM

## 2019-06-14 LAB — CBC WITH DIFFERENTIAL (CANCER CENTER ONLY)
Abs Immature Granulocytes: 0.04 10*3/uL (ref 0.00–0.07)
Basophils Absolute: 0.1 10*3/uL (ref 0.0–0.1)
Basophils Relative: 1 %
Eosinophils Absolute: 0.1 10*3/uL (ref 0.0–0.5)
Eosinophils Relative: 1 %
HCT: 45.4 % (ref 39.0–52.0)
Hemoglobin: 15 g/dL (ref 13.0–17.0)
Immature Granulocytes: 0 %
Lymphocytes Relative: 9 %
Lymphs Abs: 0.8 10*3/uL (ref 0.7–4.0)
MCH: 31 pg (ref 26.0–34.0)
MCHC: 33 g/dL (ref 30.0–36.0)
MCV: 93.8 fL (ref 80.0–100.0)
Monocytes Absolute: 0.7 10*3/uL (ref 0.1–1.0)
Monocytes Relative: 8 %
Neutro Abs: 7.4 10*3/uL (ref 1.7–7.7)
Neutrophils Relative %: 81 %
Platelet Count: 297 10*3/uL (ref 150–400)
RBC: 4.84 MIL/uL (ref 4.22–5.81)
RDW: 12 % (ref 11.5–15.5)
WBC Count: 9 10*3/uL (ref 4.0–10.5)
nRBC: 0 % (ref 0.0–0.2)

## 2019-06-14 LAB — CMP (CANCER CENTER ONLY)
ALT: 32 U/L (ref 0–44)
AST: 24 U/L (ref 15–41)
Albumin: 3.3 g/dL — ABNORMAL LOW (ref 3.5–5.0)
Alkaline Phosphatase: 138 U/L — ABNORMAL HIGH (ref 38–126)
Anion gap: 11 (ref 5–15)
BUN: 18 mg/dL (ref 8–23)
CO2: 23 mmol/L (ref 22–32)
Calcium: 9.2 mg/dL (ref 8.9–10.3)
Chloride: 105 mmol/L (ref 98–111)
Creatinine: 1.03 mg/dL (ref 0.61–1.24)
GFR, Est AFR Am: >60 mL/min (ref >60)
GFR, Estimated: >60 mL/min (ref >60)
Glucose, Bld: 101 mg/dL — ABNORMAL HIGH (ref 70–99)
Potassium: 4.2 mmol/L (ref 3.5–5.1)
Sodium: 139 mmol/L (ref 135–145)
Total Bilirubin: 0.3 mg/dL (ref 0.3–1.2)
Total Protein: 7.5 g/dL (ref 6.5–8.1)

## 2019-06-14 LAB — TSH: TSH: 0.775 u[IU]/mL (ref 0.320–4.118)

## 2019-06-14 MED ORDER — SODIUM CHLORIDE 0.9 % IV SOLN
Freq: Once | INTRAVENOUS | Status: AC
  Filled 2019-06-14: qty 250

## 2019-06-14 MED ORDER — SODIUM CHLORIDE 0.9 % IV SOLN
1500.0000 mg | Freq: Once | INTRAVENOUS | Status: AC
  Administered 2019-06-14: 1500 mg via INTRAVENOUS
  Filled 2019-06-14: qty 30

## 2019-06-14 NOTE — Patient Instructions (Signed)
[facility] Discharge Instructions for Patients Receiving Chemotherapy  Today you received the following chemotherapy agents: Imfinzi.  To help prevent nausea and vomiting after your treatment, we encourage you to take your nausea medication as directed.   If you develop nausea and vomiting that is not controlled by your nausea medication, call the clinic.   BELOW ARE SYMPTOMS THAT SHOULD BE REPORTED IMMEDIATELY:  *FEVER GREATER THAN 100.5 F  *CHILLS WITH OR WITHOUT FEVER  NAUSEA AND VOMITING THAT IS NOT CONTROLLED WITH YOUR NAUSEA MEDICATION  *UNUSUAL SHORTNESS OF BREATH  *UNUSUAL BRUISING OR BLEEDING  TENDERNESS IN MOUTH AND THROAT WITH OR WITHOUT PRESENCE OF ULCERS  *URINARY PROBLEMS  *BOWEL PROBLEMS  UNUSUAL RASH Items with * indicate a potential emergency and should be followed up as soon as possible.  Feel free to call the clinic should you have any questions or concerns. The clinic phone number is [phone].  Please show the CHEMO ALERT CARD at check-in to the Emergency Department and triage nurse.   

## 2019-06-14 NOTE — Progress Notes (Signed)
[place] [facility] Telephone:([phone]) [phone]   Fax:(336) [phone]  OFFICE PROGRESS NOTE  [name], MD [address] [place] [address]  DIAGNOSIS: Stage IIIc (T4,N3, M0)non-small cell lung cancer, adenocarcinoma presented with large right hilar mass in addition to bilateral hilar and mediastinal lymphadenopathy diagnosed in December 2020.  PRIOR THERAPY: Weekly concurrent chemoradiation with carboplatin for an AUC of 2 and paclitaxel 45 mg/m2. First dose starting 01/30/2019.   Status post 6 cycles.  CURRENT THERAPY: Consolidation immunotherapy with Imfinzi 1500 mg IV every 4 weeks. First dose expected on 04/19/2019.  Status post 2 cycles.  INTERVAL HISTORY: Philip [name] 64 y.o. male returns to the clinic today for follow-up visit.  The patient is feeling fine today with no concerning complaints except for cough productive of whitish sputum.  He denied having any chest pain or worsening shortness of breath.  He has no hemoptysis.  He denied having any fever or chills.  He has no nausea, vomiting, diarrhea or constipation.  He has no headache or visual changes.  The patient has been tolerating his treatment with immunotherapy fairly well.  He is here today for evaluation before starting cycle #3.  MEDICAL HISTORY: Past Medical History:  Diagnosis Date  . Hyperlipidemia   . Pneumonia   . Tobacco abuse     ALLERGIES:  is allergic to norco [hydrocodone-acetaminophen].  MEDICATIONS:  Current Outpatient Medications  Medication Sig Dispense Refill  . albuterol (VENTOLIN HFA) 108 (90 Base) MCG/ACT inhaler Inhale 2 puffs into the lungs every 4 (four) hours as needed for wheezing or shortness of breath. 18 g 6  . aspirin 325 MG tablet Take 650 mg by mouth daily as needed for moderate pain.    . Aspirin-Acetaminophen-Caffeine (GOODY HEADACHE PO) Take 1 packet by mouth daily as needed (headaches).    Marland Kitchen atorvastatin (LIPITOR) 20 MG tablet Take 20 mg by  mouth daily.    Marland Kitchen bismuth subsalicylate (PEPTO BISMOL) 262 MG/15ML suspension Take 30 mLs by mouth every 6 (six) hours as needed for indigestion.    . famotidine (PEPCID) 20 MG tablet Take 20 mg by mouth 2 (two) times daily.    Marland Kitchen lidocaine (XYLOCAINE) 2 % solution Use as directed 15 mLs in the mouth or throat 4 (four) times daily -  before meals and at bedtime. 20 minutes prior to meals (Patient not taking: Reported on 04/26/2019) 100 mL 2  . prochlorperazine (COMPAZINE) 10 MG tablet Take 1 tablet (10 mg total) by mouth every 6 (six) hours as needed for nausea or vomiting. (Patient not taking: Reported on 04/26/2019) 30 tablet 2  . sildenafil (REVATIO) 20 MG tablet Take 20-60 mg by mouth daily as needed (ED).     . sucralfate (CARAFATE) 1 g tablet Take 1 tablet (1 g total) by mouth 4 (four) times daily -  with meals and at bedtime. Dissolve in 10 mL of warm water prior to swallowing 120 tablet 1  . Tetrahydrozoline HCl (VISINE OP) Place 1 drop into both eyes daily as needed (redness).    . Tiotropium Bromide Monohydrate (SPIRIVA RESPIMAT) 1.25 MCG/ACT AERS Inhale 1 puff into the lungs daily. (Patient not taking: Reported on 04/11/2019) 4 g 11  . Tiotropium Bromide Monohydrate (SPIRIVA RESPIMAT) 1.25 MCG/ACT AERS Inhale 2 puffs into the lungs daily. (Patient not taking: Reported on 04/11/2019) 4 g 0   No current facility-administered medications for this visit.    SURGICAL HISTORY:  Past Surgical History:  Procedure Laterality Date  . COLONOSCOPY    . HAND SURGERY    . VIDEO BRONCHOSCOPY WITH ENDOBRONCHIAL ULTRASOUND N/A 12/22/2018   Procedure: VIDEO BRONCHOSCOPY WITH ENDOBRONCHIAL ULTRASOUND;  Surgeon: Garner Nash, DO;  Location: [facility];  Service: Thoracic;  Laterality: N/A;  . VIDEO BRONCHOSCOPY WITH ENDOBRONCHIAL ULTRASOUND N/A 01/09/2019   Procedure: VIDEO BRONCHOSCOPY WITH ENDOBRONCHIAL ULTRASOUND WITH FLUORO;  Surgeon: Garner Nash, DO;  Location: [facility];  Service: Thoracic;  Laterality:  N/A;  . VIDEO BRONCHOSCOPY WITH RADIAL ENDOBRONCHIAL ULTRASOUND N/A 12/22/2018   Procedure: RADIAL ENDOBRONCHIAL ULTRASOUND;  Surgeon: Garner Nash, DO;  Location: [facility];  Service: Thoracic;  Laterality: N/A;    REVIEW OF SYSTEMS:  A comprehensive review of systems was negative except for: Respiratory: positive for cough   PHYSICAL EXAMINATION: General appearance: alert, cooperative and no distress Head: Normocephalic, without obvious abnormality, atraumatic Neck: no adenopathy, no JVD, supple, symmetrical, trachea midline and thyroid not enlarged, symmetric, no tenderness/mass/nodules Lymph nodes: Cervical, supraclavicular, and axillary nodes normal. Resp: clear to auscultation bilaterally Back: symmetric, no curvature. ROM normal. No CVA tenderness. Cardio: regular rate and rhythm, S1, S2 normal, no murmur, click, rub or gallop GI: soft, non-tender; bowel sounds normal; no masses,  no organomegaly Extremities: extremities normal, atraumatic, no cyanosis or edema  ECOG PERFORMANCE STATUS: 1 - Symptomatic but completely ambulatory  Blood pressure 114/79, pulse 97, temperature 97.8 F (36.6 C), temperature source Temporal, resp. rate 20, height 5\' 6"  (1.676 m), weight 180 lb 11.2 oz (82 kg), SpO2 95 %.  LABORATORY DATA: Lab Results  Component Value Date   WBC 9.0 06/14/2019   HGB 15.0 06/14/2019   HCT 45.4 06/14/2019   MCV 93.8 06/14/2019   PLT 297 06/14/2019      Chemistry      Component Value Date/Time   NA 140 05/17/2019 0747   K 3.9 05/17/2019 0747   CL 108 05/17/2019 0747   CO2 21 (L) 05/17/2019 0747   BUN 21 05/17/2019 0747   CREATININE 1.09 05/17/2019 0747      Component Value Date/Time   CALCIUM 9.4 05/17/2019 0747   ALKPHOS 105 05/17/2019 0747   AST 22 05/17/2019 0747   ALT 33 05/17/2019 0747   BILITOT 0.4 05/17/2019 0747       RADIOGRAPHIC STUDIES: No results found.  ASSESSMENT AND PLAN: This is a very pleasant 64 years old white male with a stage  IIIc non-small cell lung cancer, adenocarcinoma diagnosed in December 2020 The patient completed a course of concurrent chemoradiation with weekly carboplatin and paclitaxel status post 6 cycles and he tolerated his treatment well and has partial response. He is currently undergoing consolidation treatment with immunotherapy with Imfinzi 1500 mg IV every 4 weeks status post 2 cycles. The patient has been tolerating this treatment well with no concerning adverse effects. I recommended for him to proceed with cycle #3 today as planned. I will see him back for follow-up visit in 4 weeks for evaluation after repeating CT scan of the chest for restaging of his disease. The patient was advised to call immediately if he has any concerning symptoms in the interval. The patient voices understanding of current disease status and treatment options and is in agreement with the current care plan. All questions were answered. The patient knows to call the clinic with any problems, questions or concerns. We can certainly see the patient much sooner if necessary.  Disclaimer: This note was dictated with voice recognition software. Similar sounding words  can inadvertently be transcribed and may not be corrected upon review.

## 2019-06-15 ENCOUNTER — Other Ambulatory Visit: Payer: Self-pay

## 2019-06-15 ENCOUNTER — Encounter (HOSPITAL_COMMUNITY): Payer: Self-pay | Admitting: Emergency Medicine

## 2019-06-15 ENCOUNTER — Emergency Department (HOSPITAL_COMMUNITY): Payer: No Typology Code available for payment source

## 2019-06-15 ENCOUNTER — Telehealth: Payer: Self-pay | Admitting: *Deleted

## 2019-06-15 ENCOUNTER — Emergency Department (HOSPITAL_COMMUNITY)
Admission: EM | Admit: 2019-06-15 | Discharge: 2019-06-16 | Disposition: A | Discharge status: Home / Self Care | Payer: No Typology Code available for payment source | Source: Home / Self Care

## 2019-06-15 DIAGNOSIS — R0602 Shortness of breath: Secondary | ICD-10-CM | POA: Insufficient documentation

## 2019-06-15 DIAGNOSIS — Z5321 Procedure and treatment not carried out due to patient leaving prior to being seen by health care provider: Secondary | ICD-10-CM | POA: Insufficient documentation

## 2019-06-15 DIAGNOSIS — R0789 Other chest pain: Secondary | ICD-10-CM | POA: Insufficient documentation

## 2019-06-15 LAB — BASIC METABOLIC PANEL
Anion gap: 11 (ref 5–15)
BUN: 10 mg/dL (ref 8–23)
CO2: 24 mmol/L (ref 22–32)
Calcium: 9.2 mg/dL (ref 8.9–10.3)
Chloride: 100 mmol/L (ref 98–111)
Creatinine, Ser: 1.02 mg/dL (ref 0.61–1.24)
GFR calc Af Amer: >60 mL/min (ref >60)
GFR calc non Af Amer: >60 mL/min (ref >60)
Glucose, Bld: 131 mg/dL — ABNORMAL HIGH (ref 70–99)
Potassium: 4.3 mmol/L (ref 3.5–5.1)
Sodium: 135 mmol/L (ref 135–145)

## 2019-06-15 LAB — PROTIME-INR
INR: 1.1 (ref 0.8–1.2)
Prothrombin Time: 13.3 seconds (ref 11.4–15.2)

## 2019-06-15 LAB — CBC
HCT: 45.2 % (ref 39.0–52.0)
Hemoglobin: 14.6 g/dL (ref 13.0–17.0)
MCH: 30.3 pg (ref 26.0–34.0)
MCHC: 32.3 g/dL (ref 30.0–36.0)
MCV: 93.8 fL (ref 80.0–100.0)
Platelets: 325 10*3/uL (ref 150–400)
RBC: 4.82 MIL/uL (ref 4.22–5.81)
RDW: 12.2 % (ref 11.5–15.5)
WBC: 11.1 10*3/uL — ABNORMAL HIGH (ref 4.0–10.5)
nRBC: 0 % (ref 0.0–0.2)

## 2019-06-15 LAB — TROPONIN I (HIGH SENSITIVITY): Troponin I (High Sensitivity): 7 ng/L (ref <18)

## 2019-06-15 MED ORDER — SODIUM CHLORIDE 0.9% FLUSH: 3.0000 mL | Freq: Once | INTRAVENOUS | Status: DC

## 2019-06-15 NOTE — ED Triage Notes (Addendum)
Patient reports right chest pain with SOB onset yesterday , denies fever or chills , no emesis , mild diaphoresis . History of right lung CA being treated at [facility] .

## 2019-06-15 NOTE — Telephone Encounter (Signed)
Received vm message from patient stating he was experiencing severe chest pain. TCT patient and spoke with him. He states the chest pain in in the right upper chest area. It does not radiate, he is not short of breath, is not diaphoretic.  He states it started after his treatment yesterday. He received Imfinzi. Denies cough, fever, chills. He experienced relief with antacid temporarily and also he took Carafate and Zegerid with temporary relief.  Advised that if the chest pain continues he should go to the ED for evaluation. Pt voiced understanding.  Advised that Dr. Julien Nordmann is not in the office this time of day or tomorrow.  Pt voiced understanding.

## 2019-06-16 ENCOUNTER — Emergency Department (HOSPITAL_COMMUNITY): Payer: No Typology Code available for payment source

## 2019-06-16 ENCOUNTER — Other Ambulatory Visit: Payer: Self-pay

## 2019-06-16 ENCOUNTER — Telehealth: Payer: Self-pay | Admitting: Medical Oncology

## 2019-06-16 ENCOUNTER — Inpatient Hospital Stay (HOSPITAL_COMMUNITY)
Admission: EM | Admit: 2019-06-16 | Discharge: 2019-06-19 | DRG: 175 | Disposition: A | Discharge status: Home / Self Care | Payer: No Typology Code available for payment source | Attending: Internal Medicine | Admitting: Internal Medicine

## 2019-06-16 ENCOUNTER — Telehealth: Payer: Self-pay | Admitting: Critical Care Medicine

## 2019-06-16 ENCOUNTER — Encounter (HOSPITAL_COMMUNITY): Payer: Self-pay

## 2019-06-16 ENCOUNTER — Ambulatory Visit: Payer: No Typology Code available for payment source | Admitting: Pulmonary Disease

## 2019-06-16 DIAGNOSIS — A310 Pulmonary mycobacterial infection: Secondary | ICD-10-CM | POA: Diagnosis present

## 2019-06-16 DIAGNOSIS — E785 Hyperlipidemia, unspecified: Secondary | ICD-10-CM | POA: Diagnosis present

## 2019-06-16 DIAGNOSIS — J90 Pleural effusion, not elsewhere classified: Secondary | ICD-10-CM | POA: Diagnosis present

## 2019-06-16 DIAGNOSIS — Z791 Long term (current) use of non-steroidal anti-inflammatories (NSAID): Secondary | ICD-10-CM | POA: Diagnosis not present

## 2019-06-16 DIAGNOSIS — J159 Unspecified bacterial pneumonia: Secondary | ICD-10-CM | POA: Diagnosis present

## 2019-06-16 DIAGNOSIS — J209 Acute bronchitis, unspecified: Secondary | ICD-10-CM | POA: Diagnosis not present

## 2019-06-16 DIAGNOSIS — I2699 Other pulmonary embolism without acute cor pulmonale: Secondary | ICD-10-CM | POA: Diagnosis present

## 2019-06-16 DIAGNOSIS — Z79899 Other long term (current) drug therapy: Secondary | ICD-10-CM

## 2019-06-16 DIAGNOSIS — J181 Lobar pneumonia, unspecified organism: Secondary | ICD-10-CM | POA: Diagnosis not present

## 2019-06-16 DIAGNOSIS — C3431 Malignant neoplasm of lower lobe, right bronchus or lung: Secondary | ICD-10-CM | POA: Diagnosis present

## 2019-06-16 DIAGNOSIS — C3491 Malignant neoplasm of unspecified part of right bronchus or lung: Secondary | ICD-10-CM | POA: Diagnosis not present

## 2019-06-16 DIAGNOSIS — J9621 Acute and chronic respiratory failure with hypoxia: Secondary | ICD-10-CM

## 2019-06-16 DIAGNOSIS — Z7982 Long term (current) use of aspirin: Secondary | ICD-10-CM | POA: Diagnosis not present

## 2019-06-16 DIAGNOSIS — R000 Tachycardia, unspecified: Secondary | ICD-10-CM | POA: Diagnosis present

## 2019-06-16 DIAGNOSIS — K5900 Constipation, unspecified: Secondary | ICD-10-CM | POA: Diagnosis present

## 2019-06-16 DIAGNOSIS — Z86711 Personal history of pulmonary embolism: Secondary | ICD-10-CM | POA: Diagnosis present

## 2019-06-16 DIAGNOSIS — H9190 Unspecified hearing loss, unspecified ear: Secondary | ICD-10-CM | POA: Diagnosis present

## 2019-06-16 DIAGNOSIS — F17210 Nicotine dependence, cigarettes, uncomplicated: Secondary | ICD-10-CM | POA: Diagnosis present

## 2019-06-16 DIAGNOSIS — I4460 Unspecified fascicular block: Secondary | ICD-10-CM | POA: Diagnosis present

## 2019-06-16 DIAGNOSIS — I2693 Single subsegmental pulmonary embolism without acute cor pulmonale: Secondary | ICD-10-CM | POA: Diagnosis not present

## 2019-06-16 DIAGNOSIS — J189 Pneumonia, unspecified organism: Secondary | ICD-10-CM

## 2019-06-16 DIAGNOSIS — I071 Rheumatic tricuspid insufficiency: Secondary | ICD-10-CM | POA: Diagnosis present

## 2019-06-16 DIAGNOSIS — J440 Chronic obstructive pulmonary disease with acute lower respiratory infection: Secondary | ICD-10-CM | POA: Diagnosis present

## 2019-06-16 DIAGNOSIS — J9600 Acute respiratory failure, unspecified whether with hypoxia or hypercapnia: Secondary | ICD-10-CM

## 2019-06-16 DIAGNOSIS — Z20822 Contact with and (suspected) exposure to covid-19: Secondary | ICD-10-CM | POA: Diagnosis present

## 2019-06-16 DIAGNOSIS — J9601 Acute respiratory failure with hypoxia: Secondary | ICD-10-CM | POA: Diagnosis present

## 2019-06-16 DIAGNOSIS — R0600 Dyspnea, unspecified: Secondary | ICD-10-CM

## 2019-06-16 DIAGNOSIS — Z885 Allergy status to narcotic agent status: Secondary | ICD-10-CM

## 2019-06-16 DIAGNOSIS — J441 Chronic obstructive pulmonary disease with (acute) exacerbation: Secondary | ICD-10-CM

## 2019-06-16 DIAGNOSIS — J449 Chronic obstructive pulmonary disease, unspecified: Secondary | ICD-10-CM

## 2019-06-16 HISTORY — DX: Malignant (primary) neoplasm, unspecified: C80.1

## 2019-06-16 LAB — CBC
HCT: 44.7 % (ref 39.0–52.0)
Hemoglobin: 14.7 g/dL (ref 13.0–17.0)
MCH: 30.8 pg (ref 26.0–34.0)
MCHC: 32.9 g/dL (ref 30.0–36.0)
MCV: 93.5 fL (ref 80.0–100.0)
Platelets: 280 10*3/uL (ref 150–400)
RBC: 4.78 MIL/uL (ref 4.22–5.81)
RDW: 12.3 % (ref 11.5–15.5)
WBC: 12.4 10*3/uL — ABNORMAL HIGH (ref 4.0–10.5)
nRBC: 0 % (ref 0.0–0.2)

## 2019-06-16 LAB — PROTIME-INR
INR: 1.1 (ref 0.8–1.2)
Prothrombin Time: 14 seconds (ref 11.4–15.2)

## 2019-06-16 LAB — APTT
aPTT: 37 seconds — ABNORMAL HIGH (ref 24–36)
aPTT: 39 seconds — ABNORMAL HIGH (ref 24–36)

## 2019-06-16 LAB — COMPREHENSIVE METABOLIC PANEL
ALT: 24 U/L (ref 0–44)
AST: 18 U/L (ref 15–41)
Albumin: 3.6 g/dL (ref 3.5–5.0)
Alkaline Phosphatase: 109 U/L (ref 38–126)
Anion gap: 10 (ref 5–15)
BUN: 17 mg/dL (ref 8–23)
CO2: 25 mmol/L (ref 22–32)
Calcium: 8.9 mg/dL (ref 8.9–10.3)
Chloride: 98 mmol/L (ref 98–111)
Creatinine, Ser: 0.87 mg/dL (ref 0.61–1.24)
GFR calc Af Amer: >60 mL/min (ref >60)
GFR calc non Af Amer: >60 mL/min (ref >60)
Glucose, Bld: 125 mg/dL — ABNORMAL HIGH (ref 70–99)
Potassium: 4.2 mmol/L (ref 3.5–5.1)
Sodium: 133 mmol/L — ABNORMAL LOW (ref 135–145)
Total Bilirubin: 0.6 mg/dL (ref 0.3–1.2)
Total Protein: 8 g/dL (ref 6.5–8.1)

## 2019-06-16 LAB — MRSA PCR SCREENING: MRSA by PCR: NEGATIVE

## 2019-06-16 LAB — TROPONIN I (HIGH SENSITIVITY)
Troponin I (High Sensitivity): 7 ng/L (ref <18)
Troponin I (High Sensitivity): 4 ng/L (ref <18)

## 2019-06-16 LAB — SARS CORONAVIRUS 2 BY RT PCR (HOSPITAL ORDER, PERFORMED IN ~~LOC~~ HOSPITAL LAB): SARS Coronavirus 2: NEGATIVE

## 2019-06-16 LAB — BRAIN NATRIURETIC PEPTIDE: B Natriuretic Peptide: 63.5 pg/mL (ref 0.0–100.0)

## 2019-06-16 LAB — PROCALCITONIN: Procalcitonin: <0.1 ng/mL

## 2019-06-16 MED ORDER — HEPARIN (PORCINE) 25000 UT/250ML-% IV SOLN
1400.0000 [IU]/h | INTRAVENOUS | Status: DC
  Administered 2019-06-16 – 2019-06-18 (×3): 1400 [IU]/h via INTRAVENOUS
  Filled 2019-06-16 (×3): qty 250

## 2019-06-16 MED ORDER — ALBUTEROL SULFATE HFA 108 (90 BASE) MCG/ACT IN AERS: 2.0000 | INHALATION_SPRAY | RESPIRATORY_TRACT | Status: DC | PRN

## 2019-06-16 MED ORDER — GUAIFENESIN ER 600 MG PO TB12
600.0000 mg | ORAL_TABLET | Freq: Two times a day (BID) | ORAL | Status: DC
  Administered 2019-06-16 – 2019-06-19 (×6): 600 mg via ORAL
  Filled 2019-06-16 (×6): qty 1

## 2019-06-16 MED ORDER — SUCRALFATE 1 G PO TABS
1.0000 g | ORAL_TABLET | Freq: Two times a day (BID) | ORAL | Status: DC
  Administered 2019-06-17 – 2019-06-19 (×5): 1 g via ORAL
  Filled 2019-06-16 (×5): qty 1

## 2019-06-16 MED ORDER — MORPHINE SULFATE (PF) 4 MG/ML IV SOLN
4.0000 mg | Freq: Once | INTRAVENOUS | Status: AC
  Administered 2019-06-16: 4 mg via INTRAVENOUS
  Filled 2019-06-16: qty 1

## 2019-06-16 MED ORDER — SENNOSIDES-DOCUSATE SODIUM 8.6-50 MG PO TABS
1.0000 | ORAL_TABLET | Freq: Two times a day (BID) | ORAL | Status: DC
  Administered 2019-06-16 – 2019-06-19 (×6): 1 via ORAL
  Filled 2019-06-16 (×6): qty 1

## 2019-06-16 MED ORDER — NAPHAZOLINE-GLYCERIN 0.012-0.2 % OP SOLN
1.0000 [drp] | Freq: Every day | OPHTHALMIC | Status: DC | PRN
  Filled 2019-06-16: qty 15

## 2019-06-16 MED ORDER — IPRATROPIUM-ALBUTEROL 0.5-2.5 (3) MG/3ML IN SOLN
3.0000 mL | Freq: Two times a day (BID) | RESPIRATORY_TRACT | Status: DC
  Administered 2019-06-17 – 2019-06-19 (×5): 3 mL via RESPIRATORY_TRACT
  Filled 2019-06-16 (×6): qty 3

## 2019-06-16 MED ORDER — SODIUM CHLORIDE 0.9 % IV SOLN
2.0000 g | Freq: Three times a day (TID) | INTRAVENOUS | Status: DC
  Administered 2019-06-17 – 2019-06-19 (×8): 2 g via INTRAVENOUS
  Filled 2019-06-16 (×10): qty 2

## 2019-06-16 MED ORDER — UMECLIDINIUM BROMIDE 62.5 MCG/INH IN AEPB
1.0000 | INHALATION_SPRAY | Freq: Every day | RESPIRATORY_TRACT | Status: DC
  Administered 2019-06-17 – 2019-06-19 (×3): 1 via RESPIRATORY_TRACT
  Filled 2019-06-16: qty 7

## 2019-06-16 MED ORDER — IOHEXOL 350 MG/ML SOLN
100.0000 mL | Freq: Once | INTRAVENOUS | Status: AC | PRN
  Administered 2019-06-16: 100 mL via INTRAVENOUS

## 2019-06-16 MED ORDER — ATORVASTATIN CALCIUM 20 MG PO TABS
20.0000 mg | ORAL_TABLET | Freq: Every day | ORAL | Status: DC
  Administered 2019-06-17 – 2019-06-19 (×3): 20 mg via ORAL
  Filled 2019-06-16 (×3): qty 1

## 2019-06-16 MED ORDER — VANCOMYCIN HCL IN DEXTROSE 1-5 GM/200ML-% IV SOLN
1000.0000 mg | Freq: Two times a day (BID) | INTRAVENOUS | Status: DC
  Administered 2019-06-17: 1000 mg via INTRAVENOUS
  Filled 2019-06-16: qty 200

## 2019-06-16 MED ORDER — HEPARIN BOLUS VIA INFUSION
4200.0000 [IU] | Freq: Once | INTRAVENOUS | Status: AC
  Administered 2019-06-16: 4200 [IU] via INTRAVENOUS
  Filled 2019-06-16: qty 4200

## 2019-06-16 MED ORDER — SODIUM CHLORIDE 0.9% FLUSH
3.0000 mL | Freq: Once | INTRAVENOUS | Status: AC
  Administered 2019-06-16: 3 mL via INTRAVENOUS

## 2019-06-16 MED ORDER — MORPHINE SULFATE (PF) 2 MG/ML IV SOLN
2.0000 mg | INTRAVENOUS | Status: DC | PRN
  Administered 2019-06-16 – 2019-06-19 (×14): 2 mg via INTRAVENOUS
  Filled 2019-06-16 (×15): qty 1

## 2019-06-16 MED ORDER — TIOTROPIUM BROMIDE MONOHYDRATE 1.25 MCG/ACT IN AERS: 1.0000 | INHALATION_SPRAY | Freq: Every day | RESPIRATORY_TRACT | Status: DC

## 2019-06-16 MED ORDER — VANCOMYCIN HCL 1500 MG/300ML IV SOLN
1500.0000 mg | Freq: Once | INTRAVENOUS | Status: AC
  Administered 2019-06-16: 1500 mg via INTRAVENOUS
  Filled 2019-06-16: qty 300

## 2019-06-16 MED ORDER — IPRATROPIUM-ALBUTEROL 0.5-2.5 (3) MG/3ML IN SOLN: 3.0000 mL | Freq: Three times a day (TID) | RESPIRATORY_TRACT | Status: DC

## 2019-06-16 MED ORDER — SODIUM CHLORIDE 0.9 % IV SOLN
2.0000 g | Freq: Once | INTRAVENOUS | Status: AC
  Administered 2019-06-16: 2 g via INTRAVENOUS
  Filled 2019-06-16: qty 2

## 2019-06-16 MED ORDER — SODIUM CHLORIDE 0.9 % IV SOLN: INTRAVENOUS | Status: AC

## 2019-06-16 MED ORDER — TETRAHYDROZOLINE HCL 0.05 % OP SOLN: 1.0000 [drp] | Freq: Every day | OPHTHALMIC | Status: DC | PRN

## 2019-06-16 MED ORDER — ALBUTEROL SULFATE (2.5 MG/3ML) 0.083% IN NEBU: 2.5000 mg | INHALATION_SOLUTION | RESPIRATORY_TRACT | Status: DC | PRN

## 2019-06-16 NOTE — Telephone Encounter (Signed)
Patient states he is having intense pain. He went to the ER last night and waited 4 hours for "just an x ray and vitals" and it was going to be another 4 hours to be seen, so he left. Patient states he has a history of cancer. Please advise. [phone]

## 2019-06-16 NOTE — Progress Notes (Signed)
ANTICOAGULATION CONSULT NOTE - Initial Consult  Pharmacy Consult for Heparin Indication: pulmonary embolus  Allergies  Allergen Reactions  . Norco [Hydrocodone-Acetaminophen] Other (See Comments)    Made pt feel jittery     Patient Measurements: Height: 5\' 6"  (167.6 cm) Weight: 85 kg (187 lb 6.3 oz) IBW/kg (Calculated) : 63.8 Heparin Dosing Weight: 81.3 kg  Vital Signs: Temp: 97.5 F (36.4 C) (05/14 1400) Temp Source: Oral (05/14 1400) BP: 104/78 (05/14 1700) Pulse Rate: 113 (05/14 1606)  Labs: Recent Labs    06/14/19 0900 06/15/19 2200 06/16/19 0010 06/16/19 1517  HGB 15.0 14.6  --  14.7  HCT 45.4 45.2  --  44.7  PLT 297 325  --  280  LABPROT  --  13.3  --  14.0  INR  --  1.1  --  1.1  CREATININE 1.03 1.02  --  0.87  TROPONINIHS  --  7 7 4     Estimated Creatinine Clearance: 88.9 mL/min (by C-G formula based on SCr of 0.87 mg/dL).   Medical History: Past Medical History:  Diagnosis Date  . Cancer ([place])   . Hyperlipidemia   . Pneumonia   . Tobacco abuse     Medications:  Scheduled:  . heparin  4,200 Units Intravenous Once   Infusions:  . ceFEPime (MAXIPIME) IV 2 g (06/16/19 1722)  . [START ON 06/17/2019] ceFEPime (MAXIPIME) IV    . heparin    . [START ON 06/17/2019] vancomycin    . vancomycin     PRN:   Assessment: 64 yo male with NSCLC on chemotherapy presents with chest pain, cough and shortness of breath.  Found to have +PE, pharmacy consulted to dose IV heparin.   Baseline PT/INR wnl, aPTT pending  CBC wnl  No anticoagulants PTA; endorses prn aspirin and ibuprofen  Goal of Therapy:  Heparin level 0.3-0.7 units/ml Monitor platelets by anticoagulation protocol: Yes   Plan:   Heparin 4200 units IV bolus  Heparin IV infusion 1400 units/hr  Check heparin level in 6hrs  Daily heparin level and CBC  Monitor for signs/symptoms of bleeding  Peggyann Juba, PharmD, BCPS Pharmacy: [phone] 06/16/2019,5:26 PM

## 2019-06-16 NOTE — Telephone Encounter (Signed)
I talked to pt and told him to go to ED now at [facility]. He said he is going now.  He knows to show his treatment wallet card and tell them he is a cancer pt  and on immunotherapy .  I told him I will cancel his appt with Tyler Aas.  Wyn Quaker updated.

## 2019-06-16 NOTE — ED Triage Notes (Addendum)
Patient c/o intermittent right chest pain and SOB since yesterday. Patient states the pain is worse when he coughs. Patient states that he also has been having  A productive cough with clear sputum. Patient states his last chemo treatment was 2 days ago.  Patient added that he has had black stools x 1 week.

## 2019-06-16 NOTE — ED Notes (Signed)
Pt stated he was leaving.

## 2019-06-16 NOTE — Telephone Encounter (Addendum)
Intense Right upper chest pain -nipple area radiates to R side , started not quite a week ago. He is belching and passing stool .LBM this am and described as " real dark black, I think it is just just "coffee".  No vomiting , nausea, can't eat much .   Antacid helps Took 2 advil yesterday, no tylenol.  He said he went to ED yesterday and had tests done but left due to wait .CBC/diff done yesterday -hgb wnl.   I told him he needs to go back to ED now. Then he  said that he has appt today at [facility] .

## 2019-06-16 NOTE — Telephone Encounter (Signed)
err

## 2019-06-16 NOTE — ED Notes (Signed)
Attempted to call report, no answer

## 2019-06-16 NOTE — ED Notes (Signed)
Secretary attempted to call unit station and number listed for report and no answer

## 2019-06-16 NOTE — ED Notes (Signed)
MD at bedside. 

## 2019-06-16 NOTE — Progress Notes (Signed)
Pharmacy Antibiotic Note  Philip [name] is a 64 y.o. male admitted on 06/16/2019 with possible pneumonia.  Pharmacy has been consulted for vancomycin and cefepime dosing.  Plan: Cefepime 2g IV q8h Vancomycin 1500mg  IV x 1, then 1g IV q12h Vancomycin levels as needed Follow up renal function & cultures  Height: 5\' 6"  (167.6 cm) Weight: 85 kg (187 lb 6.3 oz) IBW/kg (Calculated) : 63.8  Temp (24hrs), Avg:97.9 F (36.6 C), Min:97.5 F (36.4 C), Max:98.2 F (36.8 C)  Recent Labs  Lab 06/14/19 0900 06/15/19 2200 06/16/19 1517  WBC 9.0 11.1* 12.4*  CREATININE 1.03 1.02 0.87    Estimated Creatinine Clearance: 88.9 mL/min (by C-G formula based on SCr of 0.87 mg/dL).    Allergies  Allergen Reactions  . Norco [Hydrocodone-Acetaminophen] Other (See Comments)    Made pt feel jittery     Antimicrobials this admission:  5/14 Cefepime >> 5/14 Vancomycin >>  Dose adjustments this admission:   Microbiology results:  5/14 BCx: sent 5/14 COVID: 5/14 MRSA PCR:  Thank you for allowing pharmacy to be a part of this patient's care.  Peggyann Juba, PharmD, BCPS Pharmacy: [phone] 06/16/2019 4:25 PM

## 2019-06-16 NOTE — ED Notes (Signed)
Nurse currently unavailable for report, floor states will call back

## 2019-06-16 NOTE — H&P (Signed)
History and Physical    Philip [name] RCV:[id number] DOB: 1955/07/12 DOA: 06/16/2019  PCP: [name], MD  Patient coming from: home  I have personally briefly reviewed patient's old medical records in [place]  Chief Complaint: Pleuritic right sided chest pain, short of breath, productive cough  HPI: Philip [name] is a 64 y.o. 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  He presented to the ED due to above complaints, he denies fever, he report poor appetite, reports being constipated.  No edema.   ED Course: Blood pressure (!) 112/94, pulse (!) 115, temperature (!) 97.5 F (36.4 C), temperature source Oral, resp. rate (!) 21, height _0  (1.676 m), weight 85 kg, SpO2 95 %.   SARS-CoV-2 screening test negative  CTA of the chest: Singular pulmonary embolism in the right lower lobe pulmonary artery medially new from the prior exam of 04/10/2019  Acute on chronic infiltrate scattered throughout the right and left lungs primarily within the upper lobes.  Stable masslike architectural distortion in the right hilum and right lower lobe similar to that seen on the prior exam.  New small right-sided pleural effusion.  CT of the abdomen and pelvis: No acute abnormality is seen.  EKG: Independently reviewed.  Sinus tachycardia, left fascicular block, no acute ST-T changes  Blood culture obtained in the ED  He is started on heparin drip for PE, he is given vancomycin and cefepime for pneumonia, hospitalist called to admit the  patient.  Review of Systems: As per HPI otherwise all other systems reviewed and are negative.   Past Medical History:  Diagnosis Date  . Cancer ([place])   . Hyperlipidemia   . Pneumonia   . Tobacco abuse     Past Surgical History:  Procedure Laterality Date  . COLONOSCOPY    . HAND SURGERY    . VIDEO BRONCHOSCOPY WITH ENDOBRONCHIAL ULTRASOUND N/A 12/22/2018   Procedure: VIDEO BRONCHOSCOPY WITH ENDOBRONCHIAL ULTRASOUND;  Surgeon: Garner Nash, DO;  Location: [facility];  Service: Thoracic;  Laterality: N/A;  . VIDEO BRONCHOSCOPY WITH ENDOBRONCHIAL ULTRASOUND N/A 01/09/2019   Procedure: VIDEO BRONCHOSCOPY WITH ENDOBRONCHIAL ULTRASOUND WITH FLUORO;  Surgeon: Garner Nash, DO;  Location: [facility];  Service: Thoracic;  Laterality: N/A;  . VIDEO BRONCHOSCOPY WITH RADIAL ENDOBRONCHIAL ULTRASOUND N/A 12/22/2018   Procedure: RADIAL ENDOBRONCHIAL ULTRASOUND;  Surgeon: Garner Nash, DO;  Location: [facility];  Service: Thoracic;  Laterality: N/A;    Social History  reports that he quit smoking about 3 months ago. His smoking use included cigarettes. He started smoking about 48 years ago. He has a 22.50 pack-year smoking history. He has quit using smokeless tobacco.  His smokeless tobacco use included chew. He reports current alcohol use. He reports previous drug use. Drug: Marijuana.  Allergies  Allergen Reactions  . Norco [Hydrocodone-Acetaminophen] Other (See Comments)    Made pt feel jittery     Family History  Problem Relation Age of Onset  . Cancer Father   . Stomach cancer Father      Prior to Admission medications   Medication Sig Start Date  End Date Taking? Authorizing Provider  albuterol (VENTOLIN HFA) 108 (90 Base) MCG/ACT inhaler Inhale 2 puffs into the lungs every 4 (four) hours as needed for wheezing or shortness of breath. 03/02/19  Yes [name], DO  aspirin 325 MG tablet Take 650 mg by mouth daily as needed for moderate pain.   Yes [provider]   Aspirin-Acetaminophen-Caffeine (GOODY HEADACHE PO) Take 1 packet by mouth daily as needed (headaches).   Yes [provider]  atorvastatin (LIPITOR) 20 MG tablet Take 20 mg by mouth daily. 09/22/18  Yes [provider]  bismuth subsalicylate (PEPTO BISMOL) 262 MG/15ML suspension Take 30 mLs by mouth every 6 (six) hours as needed for indigestion.   Yes [provider]  famotidine (PEPCID) 20 MG tablet Take 20 mg by mouth 2 (two) times daily.   Yes [provider]  ibuprofen (ADVIL) 200 MG tablet Take 400 mg by mouth as needed for moderate pain or cramping.   Yes [provider]  omeprazole-sodium bicarbonate (ZEGERID) 40-1100 MG capsule Take 1 capsule by mouth daily before breakfast.   Yes [provider]  sildenafil (REVATIO) 20 MG tablet Take 20-60 mg by mouth daily as needed (ED).  09/22/18  Yes [provider]  Tetrahydrozoline HCl (VISINE OP) Place 1 drop into both eyes daily as needed (redness).   Yes [provider]  Tiotropium Bromide Monohydrate (SPIRIVA RESPIMAT) 1.25 MCG/ACT AERS Inhale 1 puff into the lungs daily. 03/02/19  Yes [name], DO  lidocaine (XYLOCAINE) 2 % solution Use as directed 15 mLs in the mouth or throat 4 (four) times daily -  before meals and at bedtime. 20 minutes prior to meals Patient not taking: Reported on 04/26/2019 03/13/19   Gery Pray, MD  prochlorperazine (COMPAZINE) 10 MG tablet Take 1 tablet (10 mg total) by mouth every 6 (six) hours as needed for nausea or vomiting. Patient not taking: Reported on 04/26/2019 01/13/19   Heilingoetter, [name], [name]  sucralfate (CARAFATE) 1 g tablet Take 1 tablet (1 g total) by mouth 4 (four) times daily -  with meals and at bedtime. Dissolve in 10 mL of warm water prior to swallowing Patient not taking: Reported on 06/16/2019 02/28/19   Gery Pray, MD  Tiotropium Bromide Monohydrate (SPIRIVA RESPIMAT) 1.25 MCG/ACT AERS Inhale 2 puffs into the  lungs daily. Patient not taking: Reported on 04/11/2019 03/02/19   [name], DO    Physical Exam:  Constitutional: In pain, does not look comfortable Eyes: PERRL, lids and conjunctivae normal ENMT: Mucous membranes are moist. Posterior pharynx clear of any exudate or lesions.Normal dentition.  Neck: normal, supple, no masses, no thyromegaly Respiratory: Diminished on the right side ,  no wheezing, no crackles.  Slightly tachypneic ,. No accessory muscle use.  Left lung exam unremarkable Cardiovascular: Sinus tachycardia . No extremity edema. 2+ pedal pulses.  Abdomen: no tenderness, no masses palpated. No hepatosplenomegaly. Bowel sounds positive.  Musculoskeletal: no clubbing / cyanosis. No joint deformity upper and lower extremities. Good ROM, no contractures. Normal muscle tone.  Skin: no rashes, lesions, ulcers. No induration Neurologic: CN 2-12 grossly intact. Sensation intact, DTR normal. Strength 5/5 in all 4.  Psychiatric: Normal judgment and insight. Alert and oriented x 3.  Anxious   Labs on Admission: I have personally reviewed following labs and imaging studies  CBC: Recent Labs  Lab 06/14/19 0900 06/15/19 2200 06/16/19 1517  WBC 9.0 11.1* 12.4*  NEUTROABS 7.4  --   --  HGB 15.0 14.6 14.7  HCT 45.4 45.2 44.7  MCV 93.8 93.8 93.5  PLT 297 325 161    Basic Metabolic Panel: Recent Labs  Lab 06/14/19 0900 06/15/19 2200 06/16/19 1517  NA 139 135 133*  K 4.2 4.3 4.2  CL 105 100 98  CO2 _0 GLUCOSE 101* 131* 125*  BUN _1 CREATININE 1.03 1.02 0.87  CALCIUM 9.2 9.2 8.9    GFR: Estimated Creatinine Clearance: 88.9 mL/min (by C-G formula based on SCr of 0.87 mg/dL).  Liver Function Tests: Recent Labs  Lab 06/14/19 0900 06/16/19 1517  AST 24 18  ALT 32 24  ALKPHOS 138* 109  BILITOT 0.3 0.6  PROT 7.5 8.0  ALBUMIN 3.3* 3.6    Urine analysis: No results found for: COLORURINE, APPEARANCEUR, LABSPEC, PHURINE, GLUCOSEU, HGBUR, BILIRUBINUR,  KETONESUR, PROTEINUR, UROBILINOGEN, NITRITE, LEUKOCYTESUR  Radiological Exams on Admission: DG Chest 2 View  Result Date: 06/16/2019 CLINICAL DATA:  Chest pain and shortness of breath. Lung cancer patient. EXAM: CHEST - 2 VIEW COMPARISON:  06/15/2019 FINDINGS: Worsening of widespread pulmonary density which could be worsening pneumonia, more likely than edema. No dense consolidation, collapse or effusion. IMPRESSION: Worsening bilateral pulmonary density worrisome for worsening pneumonia. Electronically Signed   By: Nelson Chimes M.D.   On: 06/16/2019 14:55   DG Chest 2 View  Result Date: 06/15/2019 CLINICAL DATA:  Chest pain EXAM: CHEST - 2 VIEW COMPARISON:  CT 04/10/2019 FINDINGS: Asymmetric elevation of the right hemidiaphragm with right basilar volume loss and architectural distortion likely related to the dense right perihilar scarring and masslike opacity as well as more medial lucent changes and distortion seen on comparison CT. Left perihilar opacity is possibly increased from comparison CT and radiography. Additional streaky opacities in the bases likely reflect atelectasis. No pneumothorax or visible effusion. No acute osseous or soft tissue abnormality. Exaggerated thoracic kyphosis. Degenerative changes are present in the imaged spine and shoulders. IMPRESSION: New asymmetric elevation of the right hemidiaphragm with right basilar volume loss in a region of scarring and architectural distortion seen on comparison CT. Superimposed consolidation or infection this location is not excluded. Increasing left perihilar density is noted as well. Could reflect some consolidative process though developing lesion is not completely excluded. Could consider further evaluation with CT imaging. Electronically Signed   By: Lovena Le M.D.   On: 06/15/2019 22:18   CT Angio Chest PE W and/or Wo Contrast  Result Date: 06/16/2019 CLINICAL DATA:  Shortness of breath and chest pain on the right with abdominal  distension and black tarry stools. EXAM: CT ANGIOGRAPHY CHEST CT ABDOMEN AND PELVIS WITH CONTRAST TECHNIQUE: Multidetector CT imaging of the chest was performed using the standard protocol during bolus administration of intravenous contrast. Multiplanar CT image reconstructions and MIPs were obtained to evaluate the vascular anatomy. Multidetector CT imaging of the abdomen and pelvis was performed using the standard protocol during bolus administration of intravenous contrast. CONTRAST:  117m OMNIPAQUE IOHEXOL 350 MG/ML SOLN COMPARISON:  04/10/2019 FINDINGS: CTA CHEST FINDINGS Cardiovascular: Thoracic aorta demonstrates mild atherosclerotic calcifications without aneurysmal dilatation. No cardiac enlargement is seen. No sizable pericardial effusion is noted. The pulmonary artery shows a normal branching pattern. Singular filling defect is noted in the medial right lower lobe best seen on image number 121 of series 5. This is new from a recent CT of the chest from 04/10/2019. No other definitive pulmonary embolism is seen. Mediastinum/Nodes: Thoracic inlet is within normal limits. The esophagus as  visualized is unremarkable. Diffuse fullness of the right hilar region is noted similar to that seen on the recent exam. No significant left-sided hilar adenopathy is noted. The esophagus as visualized is within normal limits. Lungs/Pleura: Small right-sided pleural effusion is noted new from the prior exam. Area of masslike architectural distortion in the right perihilar region and right lower lobe is again identified. Some new patchy infiltrative changes noted in the right upper lobe increased when compared with the prior exam. Some similar subpleural infiltrate is noted in the left upper lobe anteriorly. Diffuse emphysematous changes are noted. Musculoskeletal: Degenerative changes of the thoracic spine are noted. No acute bony abnormality is noted. Review of the MIP images confirms the above findings. CT ABDOMEN and  PELVIS FINDINGS Hepatobiliary: No focal liver abnormality is seen. No gallstones, gallbladder wall thickening, or biliary dilatation. Pancreas: Unremarkable. No pancreatic ductal dilatation or surrounding inflammatory changes. Spleen: Normal in size without focal abnormality. Adrenals/Urinary Tract: Stable thickening of the right adrenal gland is noted. 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 Kidneys demonstrate a normal enhancement pattern. No renal calculi or obstructive changes are noted. Delayed images demonstrate normal excretion of contrast material. The bladder is well distended. Stomach/Bowel: The appendix is within normal limits. No obstructive or inflammatory changes of the colon are seen. The stomach is decompressed. No definitive ulcer is seen within the stomach. Small bowel demonstrates some scattered fluid although no obstructive changes are noted. Vascular/Lymphatic: Aortic atherosclerosis. No enlarged abdominal or pelvic lymph nodes. Reproductive: Prostate is unremarkable. Other: No abdominal wall hernia or abnormality. No abdominopelvic ascites. Musculoskeletal: Mild degenerative changes of lumbar spine are seen. Review of the MIP images confirms the above findings. IMPRESSION: CTA of the chest: Singular pulmonary embolism in the right lower lobe pulmonary artery medially new from the prior exam of 04/10/2019 Acute on chronic infiltrate scattered throughout the right and left lungs primarily within the upper lobes. Stable masslike architectural distortion in the right hilum and right lower lobe similar to that seen on the prior exam. New small right-sided pleural effusion. CT of the abdomen and pelvis: No acute abnormality is seen. Electronically Signed   By: Inez Catalina M.D.   On: 06/16/2019 17:07   CT ABDOMEN PELVIS W CONTRAST  Result Date: 06/16/2019 CLINICAL DATA:  Shortness of breath and chest pain on the right with abdominal distension and black tarry stools.  EXAM: CT ANGIOGRAPHY CHEST CT ABDOMEN AND PELVIS WITH CONTRAST TECHNIQUE: Multidetector CT imaging of the chest was performed using the standard protocol during bolus administration of intravenous contrast. Multiplanar CT image reconstructions and MIPs were obtained to evaluate the vascular anatomy. Multidetector CT imaging of the abdomen and pelvis was performed using the standard protocol during bolus administration of intravenous contrast. CONTRAST:  159m OMNIPAQUE IOHEXOL 350 MG/ML SOLN COMPARISON:  04/10/2019 FINDINGS: CTA CHEST FINDINGS Cardiovascular: Thoracic aorta demonstrates mild atherosclerotic calcifications without aneurysmal dilatation. No cardiac enlargement is seen. No sizable pericardial effusion is noted. The pulmonary artery shows a normal branching pattern. Singular filling defect is noted in the medial right lower lobe best seen on image number 121 of series 5. This is new from a recent CT of the chest from 04/10/2019. No other definitive pulmonary embolism is seen. Mediastinum/Nodes: Thoracic inlet is within normal limits. The esophagus as visualized is unremarkable. Diffuse fullness of the right hilar region is noted similar to that seen on the recent exam. No significant left-sided hilar adenopathy is noted. The esophagus as visualized  is within normal limits. Lungs/Pleura: Small right-sided pleural effusion is noted new from the prior exam. Area of masslike architectural distortion in the right perihilar region and right lower lobe is again identified. Some new patchy infiltrative changes noted in the right upper lobe increased when compared with the prior exam. Some similar subpleural infiltrate is noted in the left upper lobe anteriorly. Diffuse emphysematous changes are noted. Musculoskeletal: Degenerative changes of the thoracic spine are noted. No acute bony abnormality is noted. Review of the MIP images confirms the above findings. CT ABDOMEN and PELVIS FINDINGS Hepatobiliary: No  focal liver abnormality is seen. No gallstones, gallbladder wall thickening, or biliary dilatation. Pancreas: Unremarkable. No pancreatic ductal dilatation or surrounding inflammatory changes. Spleen: Normal in size without focal abnormality. Adrenals/Urinary Tract: Stable thickening of the right adrenal gland is noted. 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 Kidneys demonstrate a normal enhancement pattern. No renal calculi or obstructive changes are noted. Delayed images demonstrate normal excretion of contrast material. The bladder is well distended. Stomach/Bowel: The appendix is within normal limits. No obstructive or inflammatory changes of the colon are seen. The stomach is decompressed. No definitive ulcer is seen within the stomach. Small bowel demonstrates some scattered fluid although no obstructive changes are noted. Vascular/Lymphatic: Aortic atherosclerosis. No enlarged abdominal or pelvic lymph nodes. Reproductive: Prostate is unremarkable. Other: No abdominal wall hernia or abnormality. No abdominopelvic ascites. Musculoskeletal: Mild degenerative changes of lumbar spine are seen. Review of the MIP images confirms the above findings. IMPRESSION: CTA of the chest: Singular pulmonary embolism in the right lower lobe pulmonary artery medially new from the prior exam of 04/10/2019 Acute on chronic infiltrate scattered throughout the right and left lungs primarily within the upper lobes. Stable masslike architectural distortion in the right hilum and right lower lobe similar to that seen on the prior exam. New small right-sided pleural effusion. CT of the abdomen and pelvis: No acute abnormality is seen. Electronically Signed   By: Inez Catalina M.D.   On: 06/16/2019 17:07      Assessment/Plan Active Problems:   Adenocarcinoma of right lung, stage 3 (HCC)   PE (pulmonary thromboembolism) (HCC)    Acute PE, he does have sinus tachycardia, but blood pressure stable, no  hypoxia, not in respiratory distress CTA showed"Singular pulmonary embolism in the right lower lobe pulmonary artery medially"  He is started on IV heparin, will continue IV heparin, pain control , get echocardiogram, venous Doppler Admit to med telemetry  Bilateral pneumonia: SARS-CoV-2 screening negative Acute on chronic infiltrate scattered throughout the right and left lungs primarily within the upper lobes, with new small right-sided pleural effusion He does has history of MAI infection, case discussed with pulmonology Dr. Tressie Stalker who recommend get respiratory viral panel, urine Legionella, urine strep pneumo, procalcitonin Treat as bacterial infection for now if no improvement will need to call pulmonology for formal consult, if he improves can have outpatient follow-up with pulmonology for MAI infection management  MRSA screening pending collection, sputum culture ordered pending collection continue broad-spectrum antibiotic treatments with Vanco and cefepime Follow-up on culture result  COPD; no wheezing.  No hypoxia  Stage III adenocarcinoma of the lung, last immunotherapy on May 12 He follows oncology Dr. Julien Nordmann, his name added to care team  DVT prophylaxis: On heparin drip Code Status:   Full Family Communication:  Patient Disposition Plan:   Patient is from:  Home  Anticipated DC to:  Home  Anticipated DC date:  2 to 3 days  Anticipated DC barriers: PE treatment, pneumonia treatment  Consults :  Case discussed with pulmonary Dr. Tressie Stalker over the phone Admission status:  inpatient / tele   Severity of Illness: The appropriate patient status for this patient is INPATIENT. Inpatient status is judged to be reasonable and necessary in order to provide the required intensity of service to ensure the patient's safety. The patient's presenting symptoms, physical exam findings, and initial radiographic and laboratory data in the context of their chronic comorbidities is felt to  place them at high risk for further clinical deterioration. Furthermore, it is not anticipated that the patient will be medically stable for discharge from the hospital within 2 midnights of admission. The following factors support the patient status of inpatient.   " The patient's presenting symptoms include short of breath, productive cough, pleuritic chest pain. 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 Please contact the author with questions and/or clarifications.  Florencia Reasons MD PhD FACP [facility]  How to contact the [facility] Attending or Consulting provider [place] or covering provider during after hours [place], for this patient?   1. Check the care team in [facility] and look for a) attending/consulting [facility] provider listed and b) the [facility] team listed 2. Log into [email or web address] and use [place]'s universal password to access. If you do not have the password, please contact the hospital operator. 3. Locate the [facility] provider you are looking for under [facility] and page to a number that you can be directly reached. 4. If you still have difficulty reaching the provider, please page the [facility] (Director on Call) for the Hospitalists listed on amion for  assistance.  06/16/2019, 6:18 PM

## 2019-06-16 NOTE — ED Provider Notes (Signed)
Philip [name] DEPT Provider Note   CSN: [id number] Arrival date & time: 06/16/19  1351     History Chief Complaint  Patient presents with  . Chest Pain  . Shortness of Breath  . Cancer patient    Philip [name] is a 64 y.o. male.  64 year old male with prior medical history as detailed below presents for evaluation of shortness of breath, cough, and right-sided chest discomfort.  Patient reports worsening cough and worsening shortness of breath over the last 3 to 4 days.  Patient reports that he is currently followed by oncology for treatment of his lung cancer.  Patient reports that he was sent to the ED today for evaluation of possible pneumonia.  Patient does report visit to the [facility] ED last night where laboratory tests were performed but he was not evaluated by a healthcare provider.  Oncology staff apparently were able to visualize his test results from last night ED visit and advised the patient to come to the ED for likely admission.  The history is provided by the patient and medical records.  Shortness of Breath Severity:  Moderate Onset quality:  Gradual Duration:  3 days Timing:  Constant Progression:  Worsening Chronicity:  New Context: activity   Relieved by:  Nothing Worsened by:  Nothing Associated symptoms: chest pain and cough        Past Medical History:  Diagnosis Date  . Cancer ([place])   . Hyperlipidemia   . Pneumonia   . Tobacco abuse     Patient Active Problem List   Diagnosis Date Noted  . Encounter for antineoplastic immunotherapy 04/26/2019  . Odynophagia 03/13/2019  . Adenocarcinoma of right lung, stage 3 ([place]) 01/13/2019  . Goals of care, counseling/discussion 01/13/2019  . Encounter for antineoplastic chemotherapy 01/13/2019  . Tobacco abuse counseling 01/13/2019  . Pneumothorax 01/09/2019  . S/P bronchoscopy with biopsy   . Lung mass 12/22/2018  . S/P bronchoscopy     Past Surgical History:    Procedure Laterality Date  . COLONOSCOPY    . HAND SURGERY    . VIDEO BRONCHOSCOPY WITH ENDOBRONCHIAL ULTRASOUND N/A 12/22/2018   Procedure: VIDEO BRONCHOSCOPY WITH ENDOBRONCHIAL ULTRASOUND;  Surgeon: Garner Nash, DO;  Location: [facility];  Service: Thoracic;  Laterality: N/A;  . VIDEO BRONCHOSCOPY WITH ENDOBRONCHIAL ULTRASOUND N/A 01/09/2019   Procedure: VIDEO BRONCHOSCOPY WITH ENDOBRONCHIAL ULTRASOUND WITH FLUORO;  Surgeon: Garner Nash, DO;  Location: [facility];  Service: Thoracic;  Laterality: N/A;  . VIDEO BRONCHOSCOPY WITH RADIAL ENDOBRONCHIAL ULTRASOUND N/A 12/22/2018   Procedure: RADIAL ENDOBRONCHIAL ULTRASOUND;  Surgeon: Garner Nash, DO;  Location: [facility];  Service: Thoracic;  Laterality: N/A;       Family History  Problem Relation Age of Onset  . Cancer Father   . Stomach cancer Father     Social History   Tobacco Use  . Smoking status: Former Smoker    Packs/day: 0.50    Years: 45.00    Pack years: 22.50    Types: Cigarettes    Start date: 07/04/1970    Quit date: 03/14/2019    Years since quitting: 0.2  . Smokeless tobacco: Former Systems developer    Types: Chew  Substance Use Topics  . Alcohol use: Yes    Comment: drinks 1/2 5th every weekend  . Drug use: Not Currently    Types: Marijuana    Comment: none for 20 years    Home Medications Prior to Admission medications   Medication Sig  Start Date End Date Taking? Authorizing Provider  albuterol (VENTOLIN HFA) 108 (90 Base) MCG/ACT inhaler Inhale 2 puffs into the lungs every 4 (four) hours as needed for wheezing or shortness of breath. 03/02/19  Yes [name], DO  aspirin 325 MG tablet Take 650 mg by mouth daily as needed for moderate pain.   Yes [provider]  Aspirin-Acetaminophen-Caffeine (GOODY HEADACHE PO) Take 1 packet by mouth daily as needed (headaches).   Yes [provider]  atorvastatin (LIPITOR) 20 MG tablet Take 20 mg by mouth daily. 09/22/18  Yes [provider]  bismuth  subsalicylate (PEPTO BISMOL) 262 MG/15ML suspension Take 30 mLs by mouth every 6 (six) hours as needed for indigestion.   Yes [provider]  famotidine (PEPCID) 20 MG tablet Take 20 mg by mouth 2 (two) times daily.   Yes [provider]  ibuprofen (ADVIL) 200 MG tablet Take 400 mg by mouth as needed for moderate pain or cramping.   Yes [provider]  omeprazole-sodium bicarbonate (ZEGERID) 40-1100 MG capsule Take 1 capsule by mouth daily before breakfast.   Yes [provider]  sildenafil (REVATIO) 20 MG tablet Take 20-60 mg by mouth daily as needed (ED).  09/22/18  Yes [provider]  Tetrahydrozoline HCl (VISINE OP) Place 1 drop into both eyes daily as needed (redness).   Yes [provider]  Tiotropium Bromide Monohydrate (SPIRIVA RESPIMAT) 1.25 MCG/ACT AERS Inhale 1 puff into the lungs daily. 03/02/19  Yes [name], DO  lidocaine (XYLOCAINE) 2 % solution Use as directed 15 mLs in the mouth or throat 4 (four) times daily -  before meals and at bedtime. 20 minutes prior to meals Patient not taking: Reported on 04/26/2019 03/13/19   Gery Pray, MD  prochlorperazine (COMPAZINE) 10 MG tablet Take 1 tablet (10 mg total) by mouth every 6 (six) hours as needed for nausea or vomiting. Patient not taking: Reported on 04/26/2019 01/13/19   Heilingoetter, [name], [name]  sucralfate (CARAFATE) 1 g tablet Take 1 tablet (1 g total) by mouth 4 (four) times daily -  with meals and at bedtime. Dissolve in 10 mL of warm water prior to swallowing Patient not taking: Reported on 06/16/2019 02/28/19   Gery Pray, MD  Tiotropium Bromide Monohydrate (SPIRIVA RESPIMAT) 1.25 MCG/ACT AERS Inhale 2 puffs into the lungs daily. Patient not taking: Reported on 04/11/2019 03/02/19   [name], DO    Allergies    Norco [hydrocodone-acetaminophen]  Review of Systems   Review of Systems  Respiratory: Positive for cough and shortness of breath.     Cardiovascular: Positive for chest pain.  All other systems reviewed and are negative.   Physical Exam Updated Vital Signs BP 121/90 (BP Location: Left Arm)   Pulse (!) 115   Temp (!) 97.5 F (36.4 C) (Oral)   Resp 18   Ht 5\' 6"  (1.676 m)   Wt 85 kg   SpO2 96%   BMI 30.25 kg/m   Physical Exam Vitals and nursing note reviewed.  Constitutional:      General: He is not in acute distress.    Appearance: He is well-developed.  HENT:     Head: Normocephalic and atraumatic.  Eyes:     Conjunctiva/sclera: Conjunctivae normal.     Pupils: Pupils are equal, round, and reactive to light.  Cardiovascular:     Rate and Rhythm: Normal rate and regular rhythm.     Heart sounds: Normal heart sounds.  Pulmonary:     Effort: Pulmonary effort is normal. No respiratory distress.     Breath sounds: Normal breath sounds.  Abdominal:     General: There is no distension.     Palpations: Abdomen is soft.     Tenderness: There is no abdominal tenderness.  Musculoskeletal:        General: No deformity. Normal range of motion.     Cervical back: Normal range of motion and neck supple.  Skin:    General: Skin is warm and dry.  Neurological:     Mental Status: He is alert and oriented to person, place, and time.     ED Results / Procedures / Treatments   Labs (all labs ordered are listed, but only abnormal results are displayed) Labs Reviewed  CBC - Abnormal; Notable for the following components:      Result Value   WBC 12.4 (*)    All other components within normal limits  COMPREHENSIVE METABOLIC PANEL - Abnormal; Notable for the following components:   Sodium 133 (*)    Glucose, Bld 125 (*)    All other components within normal limits  SARS CORONAVIRUS 2 BY RT PCR (HOSPITAL ORDER, [facility])  CULTURE, BLOOD (ROUTINE X 2)  CULTURE, BLOOD (ROUTINE X 2)  MRSA PCR SCREENING  BRAIN NATRIURETIC PEPTIDE  PROTIME-INR  APTT  POC OCCULT BLOOD, ED  TROPONIN I  (HIGH SENSITIVITY)  TROPONIN I (HIGH SENSITIVITY)    EKG None  Radiology DG Chest 2 View  Result Date: 06/16/2019 CLINICAL DATA:  Chest pain and shortness of breath. Lung cancer patient. EXAM: CHEST - 2 VIEW COMPARISON:  06/15/2019 FINDINGS: Worsening of widespread pulmonary density which could be worsening pneumonia, more likely than edema. No dense consolidation, collapse or effusion. IMPRESSION: Worsening bilateral pulmonary density worrisome for worsening pneumonia. Electronically Signed   By: Nelson Chimes M.D.   On: 06/16/2019 14:55   DG Chest 2 View  Result Date: 06/15/2019 CLINICAL DATA:  Chest pain EXAM: CHEST - 2 VIEW COMPARISON:  CT 04/10/2019 FINDINGS: Asymmetric elevation of the right hemidiaphragm with right basilar volume loss and architectural distortion likely related to the dense right perihilar scarring and masslike opacity as well as more medial lucent changes and distortion seen on comparison CT. Left perihilar opacity is possibly increased from comparison CT and radiography. Additional streaky opacities in the bases likely reflect atelectasis. No pneumothorax or visible effusion. No acute osseous or soft tissue abnormality. Exaggerated thoracic kyphosis. Degenerative changes are present in the imaged spine and shoulders. IMPRESSION: New asymmetric elevation of the right hemidiaphragm with right basilar volume loss in a region of scarring and architectural distortion seen on comparison CT. Superimposed consolidation or infection this location is not excluded. Increasing left perihilar density is noted as well. Could reflect some consolidative process though developing lesion is not completely excluded. Could consider further evaluation with CT imaging. Electronically Signed   By: Lovena Le M.D.   On: 06/15/2019 22:18    Procedures Procedures (including critical care time) CRITICAL CARE Performed by: Valarie Merino   Total critical care time: 30 minutes  Critical care  time was exclusive of separately billable procedures and treating other patients.  Critical care was necessary to treat or prevent imminent or life-threatening deterioration.  Critical care was time spent personally by me on the following activities: development of treatment plan with patient and/or surrogate as well as nursing, discussions with consultants, evaluation of patient's response to treatment,  examination of patient, obtaining history from patient or surrogate, ordering and performing treatments and interventions, ordering and review of laboratory studies, ordering and review of radiographic studies, pulse oximetry and re-evaluation of patient's condition.  Medications Ordered in ED Medications  ceFEPIme (MAXIPIME) 2 g in sodium chloride 0.9 % 100 mL IVPB (has no administration in time range)  vancomycin (VANCOREADY) IVPB 1500 mg/300 mL (has no administration in time range)  sodium chloride flush (NS) 0.9 % injection 3 mL (3 mLs Intravenous Given 06/16/19 1539)    ED Course  I have reviewed the triage vital signs and the nursing notes.  Pertinent labs & imaging results that were available during my care of the patient were reviewed by me and considered in my medical decision making (see chart for details).    MDM Rules/Calculators/A&P                      MDM  Screen complete  ANNE SEBRING was evaluated in Emergency Department on 06/16/2019 for the symptoms described in the history of present illness. He was evaluated in the context of the global COVID-19 pandemic, which necessitated consideration that the patient might be at risk for infection with the SARS-CoV-2 virus that causes COVID-19. Institutional protocols and algorithms that pertain to the evaluation of patients at risk for COVID-19 are in a state of rapid change based on information released by regulatory bodies including the CDC and federal and state organizations. These policies and algorithms were followed during  the patient's care in the ED.   Patient is presenting for evaluation of reported shortness of breath, cough, and pleuritic right-sided chest discomfort.  Workup demonstrates evidence of acute on chronic pulmonary infiltrates.  Patient is also found to have a right-sided pulmonary embolus.  Antibiotic treatment initiated in the ED.  Heparin drip initiated in the ED.  Patient without overt evidence of active GI bleed at time of admission.  Hospitalist service is aware case and will evaluate for admission.  Final Clinical Impression(s) / ED Diagnoses Final diagnoses:  Dyspnea, unspecified type  Community acquired pneumonia, unspecified laterality  Pulmonary embolism, other, unspecified chronicity, unspecified whether acute cor pulmonale present [facility])    Rx / DC Orders ED Discharge Orders    None       Valarie Merino, MD 06/16/19 1736

## 2019-06-16 NOTE — ED Notes (Signed)
Attempted nurses station again, no answer

## 2019-06-17 ENCOUNTER — Inpatient Hospital Stay (HOSPITAL_COMMUNITY): Payer: No Typology Code available for payment source

## 2019-06-17 DIAGNOSIS — I2699 Other pulmonary embolism without acute cor pulmonale: Secondary | ICD-10-CM

## 2019-06-17 DIAGNOSIS — I2693 Single subsegmental pulmonary embolism without acute cor pulmonale: Secondary | ICD-10-CM

## 2019-06-17 DIAGNOSIS — J189 Pneumonia, unspecified organism: Secondary | ICD-10-CM

## 2019-06-17 LAB — RESPIRATORY PANEL BY PCR

## 2019-06-17 LAB — CBC
HCT: 39.2 % (ref 39.0–52.0)
Hemoglobin: 12.7 g/dL — ABNORMAL LOW (ref 13.0–17.0)
MCH: 30.6 pg (ref 26.0–34.0)
MCHC: 32.4 g/dL (ref 30.0–36.0)
MCV: 94.5 fL (ref 80.0–100.0)
Platelets: 253 10*3/uL (ref 150–400)
RBC: 4.15 MIL/uL — ABNORMAL LOW (ref 4.22–5.81)
RDW: 12.5 % (ref 11.5–15.5)
WBC: 10.4 10*3/uL (ref 4.0–10.5)
nRBC: 0 % (ref 0.0–0.2)

## 2019-06-17 LAB — ECHOCARDIOGRAM COMPLETE
Height: 66 in
Weight: 2998.26 oz

## 2019-06-17 LAB — EXPECTORATED SPUTUM ASSESSMENT W GRAM STAIN, RFLX TO RESP C

## 2019-06-17 LAB — PROCALCITONIN: Procalcitonin: <0.1 ng/mL

## 2019-06-17 LAB — HEPARIN LEVEL (UNFRACTIONATED)
Heparin Unfractionated: 0.54 IU/mL (ref 0.30–0.70)
Heparin Unfractionated: 0.48 IU/mL (ref 0.30–0.70)

## 2019-06-17 LAB — STREP PNEUMONIAE URINARY ANTIGEN: Strep Pneumo Urinary Antigen: NEGATIVE

## 2019-06-17 MED ORDER — LIDOCAINE 5 % EX PTCH
1.0000 | MEDICATED_PATCH | CUTANEOUS | Status: DC
  Administered 2019-06-17 – 2019-06-18 (×2): 1 via TRANSDERMAL
  Filled 2019-06-17 (×3): qty 1

## 2019-06-17 MED ORDER — POLYETHYLENE GLYCOL 3350 17 G PO PACK
17.0000 g | PACK | Freq: Every day | ORAL | Status: AC
  Administered 2019-06-17 – 2019-06-18 (×2): 17 g via ORAL
  Filled 2019-06-17 (×2): qty 1

## 2019-06-17 MED ORDER — BENZONATATE 100 MG PO CAPS
100.0000 mg | ORAL_CAPSULE | Freq: Three times a day (TID) | ORAL | Status: DC | PRN
  Administered 2019-06-17 – 2019-06-19 (×2): 100 mg via ORAL
  Filled 2019-06-17 (×2): qty 1

## 2019-06-17 NOTE — Progress Notes (Signed)
Lower venous duplex       has been completed. Preliminary results can be found under CV proc through chart review. June Leap, BS, RDMS, RVT

## 2019-06-17 NOTE — Progress Notes (Signed)
PROGRESS NOTE    Philip [name]  [name]:[id number] DOB: 09-18-1955 DOA: 06/16/2019 PCP: [name], MD    Chief Complaint  Patient presents with  . Chest Pain  . Shortness of Breath  . Cancer patient    Brief Narrative:   Chief Complaint: Pleuritic right sided chest pain, short of breath, productive cough  HPI: Philip [name] is a 64 y.o. 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  He presented to the ED due to above complaints, he denies fever, he report poor appetite, reports being constipated.  No edema.  Subjective:  Continue to cough a lot, with significant pleuritic chest pain on the right side Feeding oxygen supplement helped him feeling better Tachycardia has improved  Assessment & Plan:   Active Problems:   Adenocarcinoma of right lung, stage 3 (HCC)   PE (pulmonary thromboembolism) (HCC)  Acute PE -CTA showed"Singular pulmonary embolism in the right lower lobe pulmonary artery medially"  -has sinus tachycardia on presentation, this has improved, no hypoxia, though report oxygen supplement helped him feel better -Venous Doppler negative for DVT, echocardiogram left ventricular EF within normal limit, right ventricular systolic function is normal right ventricular size is normal -Continue IV heparin for another 24 hours, plan to transition to Eliquis at discharge -Continue telemetry for another 24 hours -Still have significant pleuritic chest pain, continue IV morphine as needed for pain control   Bilateral pneumonia: SARS-CoV-2  screening negative Acute on chronic infiltrate scattered throughout the right and left lungs primarily within the upper lobes, with new small right-sided pleural effusion He does has history of MAI infection, case discussed with pulmonology Dr. Tressie Stalker who recommend get respiratory viral panel, urine Legionella, urine strep pneumo, procalcitonin Treat as bacterial infection for now if no improvement will need to call pulmonology for formal consult, if he improves can have outpatient follow-up with pulmonology for MAI infection management -MRSA screening negative, sputum specimen first collection not acceptable for testing, repeat sputum culture ordered pending collection  continue broad-spectrum antibiotic treatments with cefepime, DC vancomycin due to  MRSA screening negative   COPD; no wheezing.  No hypoxia  Stage III adenocarcinoma of the lung, last immunotherapy on May 12 He follows oncology Dr. Julien Nordmann, his name added to care team  DVT prophylaxis:      On heparin drip Code Status:              Full Family Communication:       Patient Disposition Plan:              Patient is from:                        Home             Anticipated DC to:                   Home             Anticipated DC date:               2 to 3 days  Anticipated DC barriers:         PE treatment, pneumonia treatment  Consults :      Case discussed with pulmonary Dr. Tressie Stalker over the phone on May 14   Procedures:   None  Antimicrobials:    vanco from May 14 to May 15 Cefepime from May 14    Objective: Vitals:   06/16/19 1914 06/16/19 2000 06/16/19 2212 06/17/19 0511  BP: 113/85 (!) 112/94 108/88 92/68  Pulse: (!) 115 (!) 114 (!) 103 91  Resp: (!) 21 (!) 21 18 18   Temp:   97.9 F (36.6 C) 98.4 F (36.9 C)  TempSrc:   Oral Oral  SpO2: 95% 95% 96% 95%  Weight:      Height:        Intake/Output Summary (Last 24 hours) at 06/17/2019 0859 Last data filed at 06/17/2019 0300 Gross  per 24 hour  Intake 711.13 ml  Output --  Net 711.13 ml   Filed Weights   06/16/19 1410  Weight: 85 kg    Examination:  General exam: In mild distress due to significant coughing and right-sided pleuritic chest pain Respiratory system: Diminished on the right, no wheezing, left side unremarkable Cardiovascular system: S1 & S2 heard, RRR. No JVD, no murmur, No pedal edema. Gastrointestinal system: Abdomen is nondistended, soft and nontender. No organomegaly or masses felt. Normal bowel sounds heard. Central nervous system: Alert and oriented. No focal neurological deficits. Extremities: Symmetric 5 x 5 power. Skin: No rashes, lesions or ulcers Psychiatry: Judgement and insight appear normal. Mood & affect appropriate.     Data Reviewed: I have personally reviewed following labs and imaging studies  CBC: Recent Labs  Lab 06/14/19 0900 06/15/19 2200 06/16/19 1517 06/17/19 0058  WBC 9.0 11.1* 12.4* 10.4  NEUTROABS 7.4  --   --   --   HGB 15.0 14.6 14.7 12.7*  HCT 45.4 45.2 44.7 39.2  MCV 93.8 93.8 93.5 94.5  PLT 297 325 280 268    Basic Metabolic Panel: Recent Labs  Lab 06/14/19 0900 06/15/19 2200 06/16/19 1517  NA 139 135 133*  K 4.2 4.3 4.2  CL 105 100 98  CO2 23 24 25   GLUCOSE 101* 131* 125*  BUN 18 10 17   CREATININE 1.03 1.02 0.87  CALCIUM 9.2 9.2 8.9    GFR: Estimated Creatinine Clearance: 88.9 mL/min (by C-G formula based on SCr of 0.87 mg/dL).  Liver Function Tests: Recent Labs  Lab 06/14/19 0900 06/16/19 1517  AST 24 18  ALT 32 24  ALKPHOS 138* 109  BILITOT 0.3 0.6  PROT 7.5 8.0  ALBUMIN 3.3* 3.6    CBG: No results for input(s): GLUCAP in the last 168 hours.   Recent Results (from the past 240 hour(s))  Culture, blood (routine x 2)     Status: None (Preliminary result)   Collection Time: 06/16/19  3:17 PM   Specimen: BLOOD  Result Value Ref Range Status   Specimen Description   Final    BLOOD BLOOD LEFT HAND Performed at [facility], [place] [address] [place], [place] [address]    Special Requests   Final    BOTTLES DRAWN AEROBIC AND ANAEROBIC Blood Culture adequate volume Performed at [facility] [address], [place], [place] [address]    Culture   Final    NO GROWTH < 24 HOURS Performed at [facility] [address] [place], [place] [address]    Report Status  PENDING  Incomplete  Culture, blood (routine x 2)     Status: None (Preliminary result)   Collection Time: 06/16/19  3:17 PM   Specimen: BLOOD  Result Value Ref Range Status   Specimen Description   Final    BLOOD BLOOD RIGHT HAND Performed at [facility] [address], [place], [place] [address]    Special Requests   Final    BOTTLES DRAWN AEROBIC AND ANAEROBIC Blood Culture adequate volume Performed at [facility] [address], [place], [place] [address]    Culture   Final    NO GROWTH < 24 HOURS Performed at [facility] [address] [place], [place] [address]    Report Status PENDING  Incomplete  SARS Coronavirus 2 by RT PCR (hospital order, performed in [facility] hospital lab) Nasopharyngeal Nasopharyngeal Swab     Status: None   Collection Time: 06/16/19  3:17 PM   Specimen: Nasopharyngeal Swab  Result Value Ref Range Status   SARS Coronavirus 2 NEGATIVE NEGATIVE Final    Comment: (NOTE) SARS-CoV-2 target nucleic acids are NOT DETECTED. The SARS-CoV-2 RNA is generally detectable in upper and lower respiratory specimens during the acute phase of infection. The lowest concentration of SARS-CoV-2 viral copies this assay can detect is 250 copies / mL. A negative result does not preclude SARS-CoV-2 infection and should not be used as the sole basis for treatment or other patient management decisions.  A negative result may occur with improper specimen collection / handling, submission of specimen other than nasopharyngeal swab,  presence of viral mutation(s) within the areas targeted by this assay, and inadequate number of viral copies (<250 copies / mL). A negative result must be combined with clinical observations, patient history, and epidemiological information. Fact Sheet for Patients:   StrictlyIdeas.no Fact Sheet for Healthcare Providers: BankingDealers.co.za This test is not yet approved or cleared  by the [place] FDA and has been authorized for detection and/or diagnosis of SARS-CoV-2 by FDA under an Emergency Use Authorization (EUA).  This EUA will remain in effect (meaning this test can be used) for the duration of the COVID-19 declaration under Section 564(b)(1) of the Act, 21 U.S.C. section 360bbb-3(b)(1), unless the authorization is terminated or revoked sooner. Performed at [facility], [place] [address] [place], [place] [address]   MRSA PCR Screening     Status: None   Collection Time: 06/16/19  6:46 PM   Specimen: Nasopharyngeal  Result Value Ref Range Status   MRSA by PCR NEGATIVE NEGATIVE Final    Comment:        The GeneXpert MRSA Assay (FDA approved for NASAL specimens only), is one component of a comprehensive MRSA colonization surveillance program. It is not intended to diagnose MRSA infection nor to guide or monitor treatment for MRSA infections. Performed at [facility], [place] [address], [place],  [address]   Respiratory Panel by PCR     Status: None   Collection Time: 06/16/19  6:46 PM   Specimen: Respiratory  Result Value Ref Range Status   Adenovirus NOT DETECTED NOT DETECTED Final   Coronavirus 229E NOT DETECTED NOT DETECTED Final    Comment: (NOTE) The Coronavirus on the Respiratory Panel, DOES NOT test for the novel  Coronavirus (2019 nCoV)    Coronavirus HKU1 NOT DETECTED NOT DETECTED Final   Coronavirus NL63 NOT DETECTED NOT DETECTED Final   Coronavirus OC43 NOT  DETECTED NOT DETECTED Final  Metapneumovirus NOT DETECTED NOT DETECTED Final   Rhinovirus / Enterovirus NOT DETECTED NOT DETECTED Final   Influenza A NOT DETECTED NOT DETECTED Final   Influenza B NOT DETECTED NOT DETECTED Final   Parainfluenza Virus 1 NOT DETECTED NOT DETECTED Final   Parainfluenza Virus 2 NOT DETECTED NOT DETECTED Final   Parainfluenza Virus 3 NOT DETECTED NOT DETECTED Final   Parainfluenza Virus 4 NOT DETECTED NOT DETECTED Final   Respiratory Syncytial Virus NOT DETECTED NOT DETECTED Final   Bordetella pertussis NOT DETECTED NOT DETECTED Final   Chlamydophila pneumoniae NOT DETECTED NOT DETECTED Final   Mycoplasma pneumoniae NOT DETECTED NOT DETECTED Final    Comment: Performed at [facility], [place] [address], [place], [place] [address]         Radiology Studies: DG Chest 2 View  Result Date: 06/16/2019 CLINICAL DATA:  Chest pain and shortness of breath. Lung cancer patient. EXAM: CHEST - 2 VIEW COMPARISON:  06/15/2019 FINDINGS: Worsening of widespread pulmonary density which could be worsening pneumonia, more likely than edema. No dense consolidation, collapse or effusion. IMPRESSION: Worsening bilateral pulmonary density worrisome for worsening pneumonia. Electronically Signed   By: Nelson Chimes M.D.   On: 06/16/2019 14:55   DG Chest 2 View  Result Date: 06/15/2019 CLINICAL DATA:  Chest pain EXAM: CHEST - 2 VIEW COMPARISON:  CT 04/10/2019 FINDINGS: Asymmetric elevation of the right hemidiaphragm with right basilar volume loss and architectural distortion likely related to the dense right perihilar scarring and masslike opacity as well as more medial lucent changes and distortion seen on comparison CT. Left perihilar opacity is possibly increased from comparison CT and radiography. Additional streaky opacities in the bases likely reflect atelectasis. No pneumothorax or visible effusion. No acute osseous or soft tissue abnormality. Exaggerated thoracic kyphosis.  Degenerative changes are present in the imaged spine and shoulders. IMPRESSION: New asymmetric elevation of the right hemidiaphragm with right basilar volume loss in a region of scarring and architectural distortion seen on comparison CT. Superimposed consolidation or infection this location is not excluded. Increasing left perihilar density is noted as well. Could reflect some consolidative process though developing lesion is not completely excluded. Could consider further evaluation with CT imaging. Electronically Signed   By: Lovena Le M.D.   On: 06/15/2019 22:18   CT Angio Chest PE W and/or Wo Contrast  Result Date: 06/16/2019 CLINICAL DATA:  Shortness of breath and chest pain on the right with abdominal distension and black tarry stools. EXAM: CT ANGIOGRAPHY CHEST CT ABDOMEN AND PELVIS WITH CONTRAST TECHNIQUE: Multidetector CT imaging of the chest was performed using the standard protocol during bolus administration of intravenous contrast. Multiplanar CT image reconstructions and MIPs were obtained to evaluate the vascular anatomy. Multidetector CT imaging of the abdomen and pelvis was performed using the standard protocol during bolus administration of intravenous contrast. CONTRAST:  157mL OMNIPAQUE IOHEXOL 350 MG/ML SOLN COMPARISON:  04/10/2019 FINDINGS: CTA CHEST FINDINGS Cardiovascular: Thoracic aorta demonstrates mild atherosclerotic calcifications without aneurysmal dilatation. No cardiac enlargement is seen. No sizable pericardial effusion is noted. The pulmonary artery shows a normal branching pattern. Singular filling defect is noted in the medial right lower lobe best seen on image number 121 of series 5. This is new from a recent CT of the chest from 04/10/2019. No other definitive pulmonary embolism is seen. Mediastinum/Nodes: Thoracic inlet is within normal limits. The esophagus as visualized is unremarkable. Diffuse fullness of the right hilar region is noted similar to that seen on the  recent exam. No significant left-sided hilar adenopathy is noted. The esophagus as visualized is within normal limits. Lungs/Pleura: Small right-sided pleural effusion is noted new from the prior exam. Area of masslike architectural distortion in the right perihilar region and right lower lobe is again identified. Some new patchy infiltrative changes noted in the right upper lobe increased when compared with the prior exam. Some similar subpleural infiltrate is noted in the left upper lobe anteriorly. Diffuse emphysematous changes are noted. Musculoskeletal: Degenerative changes of the thoracic spine are noted. No acute bony abnormality is noted. Review of the MIP images confirms the above findings. CT ABDOMEN and PELVIS FINDINGS Hepatobiliary: No focal liver abnormality is seen. No gallstones, gallbladder wall thickening, or biliary dilatation. Pancreas: Unremarkable. No pancreatic ductal dilatation or surrounding inflammatory changes. Spleen: Normal in size without focal abnormality. Adrenals/Urinary Tract: Stable thickening of the right adrenal gland is noted. 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 Kidneys demonstrate a normal enhancement pattern. No renal calculi or obstructive changes are noted. Delayed images demonstrate normal excretion of contrast material. The bladder is well distended. Stomach/Bowel: The appendix is within normal limits. No obstructive or inflammatory changes of the colon are seen. The stomach is decompressed. No definitive ulcer is seen within the stomach. Small bowel demonstrates some scattered fluid although no obstructive changes are noted. Vascular/Lymphatic: Aortic atherosclerosis. No enlarged abdominal or pelvic lymph nodes. Reproductive: Prostate is unremarkable. Other: No abdominal wall hernia or abnormality. No abdominopelvic ascites. Musculoskeletal: Mild degenerative changes of lumbar spine are seen. Review of the MIP images confirms the above  findings. IMPRESSION: CTA of the chest: Singular pulmonary embolism in the right lower lobe pulmonary artery medially new from the prior exam of 04/10/2019 Acute on chronic infiltrate scattered throughout the right and left lungs primarily within the upper lobes. Stable masslike architectural distortion in the right hilum and right lower lobe similar to that seen on the prior exam. New small right-sided pleural effusion. CT of the abdomen and pelvis: No acute abnormality is seen. Electronically Signed   By: Inez Catalina M.D.   On: 06/16/2019 17:07   CT ABDOMEN PELVIS W CONTRAST  Result Date: 06/16/2019 CLINICAL DATA:  Shortness of breath and chest pain on the right with abdominal distension and black tarry stools. EXAM: CT ANGIOGRAPHY CHEST CT ABDOMEN AND PELVIS WITH CONTRAST TECHNIQUE: Multidetector CT imaging of the chest was performed using the standard protocol during bolus administration of intravenous contrast. Multiplanar CT image reconstructions and MIPs were obtained to evaluate the vascular anatomy. Multidetector CT imaging of the abdomen and pelvis was performed using the standard protocol during bolus administration of intravenous contrast. CONTRAST:  125mL OMNIPAQUE IOHEXOL 350 MG/ML SOLN COMPARISON:  04/10/2019 FINDINGS: CTA CHEST FINDINGS Cardiovascular: Thoracic aorta demonstrates mild atherosclerotic calcifications without aneurysmal dilatation. No cardiac enlargement is seen. No sizable pericardial effusion is noted. The pulmonary artery shows a normal branching pattern. Singular filling defect is noted in the medial right lower lobe best seen on image number 121 of series 5. This is new from a recent CT of the chest from 04/10/2019. No other definitive pulmonary embolism is seen. Mediastinum/Nodes: Thoracic inlet is within normal limits. The esophagus as visualized is unremarkable. Diffuse fullness of the right hilar region is noted similar to that seen on the recent exam. No significant  left-sided hilar adenopathy is noted. The esophagus as visualized is within normal limits. Lungs/Pleura: Small right-sided pleural effusion is noted new from the prior exam. Area of  masslike architectural distortion in the right perihilar region and right lower lobe is again identified. Some new patchy infiltrative changes noted in the right upper lobe increased when compared with the prior exam. Some similar subpleural infiltrate is noted in the left upper lobe anteriorly. Diffuse emphysematous changes are noted. Musculoskeletal: Degenerative changes of the thoracic spine are noted. No acute bony abnormality is noted. Review of the MIP images confirms the above findings. CT ABDOMEN and PELVIS FINDINGS Hepatobiliary: No focal liver abnormality is seen. No gallstones, gallbladder wall thickening, or biliary dilatation. Pancreas: Unremarkable. No pancreatic ductal dilatation or surrounding inflammatory changes. Spleen: Normal in size without focal abnormality. Adrenals/Urinary Tract: Stable thickening of the right adrenal gland is noted. 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 Kidneys demonstrate a normal enhancement pattern. No renal calculi or obstructive changes are noted. Delayed images demonstrate normal excretion of contrast material. The bladder is well distended. Stomach/Bowel: The appendix is within normal limits. No obstructive or inflammatory changes of the colon are seen. The stomach is decompressed. No definitive ulcer is seen within the stomach. Small bowel demonstrates some scattered fluid although no obstructive changes are noted. Vascular/Lymphatic: Aortic atherosclerosis. No enlarged abdominal or pelvic lymph nodes. Reproductive: Prostate is unremarkable. Other: No abdominal wall hernia or abnormality. No abdominopelvic ascites. Musculoskeletal: Mild degenerative changes of lumbar spine are seen. Review of the MIP images confirms the above findings. IMPRESSION: CTA of the  chest: Singular pulmonary embolism in the right lower lobe pulmonary artery medially new from the prior exam of 04/10/2019 Acute on chronic infiltrate scattered throughout the right and left lungs primarily within the upper lobes. Stable masslike architectural distortion in the right hilum and right lower lobe similar to that seen on the prior exam. New small right-sided pleural effusion. CT of the abdomen and pelvis: No acute abnormality is seen. Electronically Signed   By: Inez Catalina M.D.   On: 06/16/2019 17:07        Scheduled Meds: . atorvastatin  20 mg Oral Daily  . guaiFENesin  600 mg Oral BID  . ipratropium-albuterol  3 mL Nebulization BID  . senna-docusate  1 tablet Oral BID  . sucralfate  1 g Oral BID  . umeclidinium bromide  1 puff Inhalation Daily   Continuous Infusions: . sodium chloride 75 mL/hr at 06/17/19 0819  . ceFEPime (MAXIPIME) IV 2 g (06/17/19 0834)  . heparin 1,400 Units/hr (06/17/19 0824)  . vancomycin       LOS: 1 day     Time spent: 67mins I have personally reviewed and interpreted on  06/17/2019 daily labs, tele strips, imagings as discussed above under date review session and assessment and plans.  I reviewed all nursing notes, pharmacy notes,   vitals, pertinent old records  I have discussed plan of care as described above with RN , patient  on 06/17/2019  Voice Recognition /Dragon dictation system was used to create this note, attempts have been made to correct errors. Please contact the author with questions and/or clarifications.   Florencia Reasons, MD PhD FACP [facility]  Available via Epic secure chat 7am-7pm for nonurgent issues Please page for urgent issues To page the attending provider between 7A-7P or the covering provider during after hours 7P-7A, please log into the web site [email or web address] and access using universal [place] password for that web site. If you do not have the password, please call the hospital  operator.    06/17/2019, 8:59 AM

## 2019-06-17 NOTE — Progress Notes (Signed)
ANTICOAGULATION CONSULT NOTE - follow up  Pharmacy Consult for Heparin Indication: pulmonary embolus  Allergies  Allergen Reactions  . Norco [Hydrocodone-Acetaminophen] Other (See Comments)    Made pt feel jittery     Patient Measurements: Height: 5\' 6"  (167.6 cm) Weight: 85 kg (187 lb 6.3 oz) IBW/kg (Calculated) : 63.8 Heparin Dosing Weight: 81.3 kg  Vital Signs: Temp: 98.4 F (36.9 C) (05/15 0511) Temp Source: Oral (05/15 0511) BP: 92/68 (05/15 0511) Pulse Rate: 91 (05/15 0511)  Labs: Recent Labs    06/15/19 2200 06/15/19 2200 06/16/19 0010 06/16/19 1517 06/16/19 1845 06/17/19 0058 06/17/19 0800  HGB 14.6   < >  --  14.7  --  12.7*  --   HCT 45.2  --   --  44.7  --  39.2  --   PLT 325  --   --  280  --  253  --   APTT  --   --   --  37* 39*  --   --   LABPROT 13.3  --   --  14.0  --   --   --   INR 1.1  --   --  1.1  --   --   --   HEPARINUNFRC  --   --   --   --   --  0.54 0.48  CREATININE 1.02  --   --  0.87  --   --   --   TROPONINIHS 7  --  7 4  --   --   --    < > = values in this interval not displayed.    Estimated Creatinine Clearance: 88.9 mL/min (by C-G formula based on SCr of 0.87 mg/dL).   Medical History: Past Medical History:  Diagnosis Date  . Cancer ([place])   . Hyperlipidemia   . Pneumonia   . Tobacco abuse     Medications:  Scheduled:  . atorvastatin  20 mg Oral Daily  . guaiFENesin  600 mg Oral BID  . ipratropium-albuterol  3 mL Nebulization BID  . senna-docusate  1 tablet Oral BID  . sucralfate  1 g Oral BID  . umeclidinium bromide  1 puff Inhalation Daily   Infusions:  . sodium chloride 75 mL/hr at 06/17/19 0819  . ceFEPime (MAXIPIME) IV 2 g (06/17/19 0834)  . heparin 1,400 Units/hr (06/17/19 0824)  . vancomycin 1,000 mg (06/17/19 0950)   PRN:   Assessment: 64 yo male with NSCLC on chemotherapy presents with chest pain, cough and shortness of breath.  Found to have +PE, pharmacy consulted to dose IV heparin.  No  anticoagulants PTA; endorses prn aspirin and ibuprofen  Today, 06/17/19  Heparin level therapeutic now x 2 on current IV heparin rate of 1400 units/hr  Hgb 14.7 > 12.7, Plts stable  No reported bleeding  Goal of Therapy:  Heparin level 0.3-0.7 units/ml Monitor platelets by anticoagulation protocol: Yes   Plan:   Continue IV heparin at current rate of 1400 units/hr  Daily heparin level and CBC  Monitor for signs/symptoms of bleeding   Adrian Saran, PharmD, [place] [phone] 06/17/2019 11:04 AM

## 2019-06-17 NOTE — Progress Notes (Signed)
Nutrition Brief Note  Patient identified on the Malnutrition Screening Tool (MST) Report  Wt Readings from Last 15 Encounters:  06/16/19 85 kg  06/15/19 85 kg  06/14/19 82 kg  05/17/19 83.4 kg  04/26/19 81.9 kg  04/12/19 84 kg  04/11/19 83.4 kg  03/13/19 82.4 kg  03/02/19 84.2 kg  02/27/19 82.6 kg  02/20/19 81.9 kg  02/13/19 84.2 kg  01/30/19 83.9 kg  01/30/19 84.6 kg  01/13/19 81.1 kg    Body mass index is 30.25 kg/m. Patient meets criteria for obese based on current BMI.   Current diet order is regular, patient is consuming approximately 100% of meals at this time. Labs and medications reviewed.   No nutrition interventions warranted at this time. If nutrition issues arise, please consult RD.   Lajuan Lines, RD, LDN Clinical Nutrition After Hours/Weekend Pager # in [place]

## 2019-06-17 NOTE — Progress Notes (Signed)
  Echocardiogram 2D Echocardiogram has been performed.  Jennette Dubin 06/17/2019, 10:39 AM

## 2019-06-17 NOTE — Progress Notes (Signed)
Pharmacy: [facility]   Patient's a 64 y.o M with lung cancer currently undergoing chemotherapy treatment, presented to the ED on 5/14 with c/o CP and SOB. Chest CTA showed acute PE in the RLL. He's currently on heparin drip for VTE.  - First heparin level is therapeutic at 0.54 - hgb down 12.7, plts 253  Goal of Therapy:  Heparin level 0.3-0.7 units/ml Monitor platelets by anticoagulation protocol: Yes  Plan: - continue heparin drip at 1400 units/hr - recheck another level at 8AM to ensure level is still therapeutic before changing to daily monitoring - monitor for s/s bleeding  Dia Sitter, PharmD, BCPS 06/17/2019 1:46 AM

## 2019-06-18 LAB — COMPREHENSIVE METABOLIC PANEL
ALT: 23 U/L (ref 0–44)
AST: 17 U/L (ref 15–41)
Albumin: 2.7 g/dL — ABNORMAL LOW (ref 3.5–5.0)
Alkaline Phosphatase: 82 U/L (ref 38–126)
Anion gap: 6 (ref 5–15)
BUN: 14 mg/dL (ref 8–23)
CO2: 27 mmol/L (ref 22–32)
Calcium: 8.4 mg/dL — ABNORMAL LOW (ref 8.9–10.3)
Chloride: 102 mmol/L (ref 98–111)
Creatinine, Ser: 0.84 mg/dL (ref 0.61–1.24)
GFR calc Af Amer: >60 mL/min (ref >60)
GFR calc non Af Amer: >60 mL/min (ref >60)
Glucose, Bld: 106 mg/dL — ABNORMAL HIGH (ref 70–99)
Potassium: 3.9 mmol/L (ref 3.5–5.1)
Sodium: 135 mmol/L (ref 135–145)
Total Bilirubin: 0.5 mg/dL (ref 0.3–1.2)
Total Protein: 6.1 g/dL — ABNORMAL LOW (ref 6.5–8.1)

## 2019-06-18 LAB — CBC
HCT: 36.5 % — ABNORMAL LOW (ref 39.0–52.0)
Hemoglobin: 11.9 g/dL — ABNORMAL LOW (ref 13.0–17.0)
MCH: 30.8 pg (ref 26.0–34.0)
MCHC: 32.6 g/dL (ref 30.0–36.0)
MCV: 94.6 fL (ref 80.0–100.0)
Platelets: 207 10*3/uL (ref 150–400)
RBC: 3.86 MIL/uL — ABNORMAL LOW (ref 4.22–5.81)
RDW: 12.2 % (ref 11.5–15.5)
WBC: 7 10*3/uL (ref 4.0–10.5)
nRBC: 0 % (ref 0.0–0.2)

## 2019-06-18 LAB — LACTATE DEHYDROGENASE: LDH: 122 U/L (ref 98–192)

## 2019-06-18 LAB — MAGNESIUM: Magnesium: 1.8 mg/dL (ref 1.7–2.4)

## 2019-06-18 LAB — LACTIC ACID, PLASMA: Lactic Acid, Venous: 0.7 mmol/L (ref 0.5–1.9)

## 2019-06-18 LAB — HEPARIN LEVEL (UNFRACTIONATED): Heparin Unfractionated: 0.31 IU/mL (ref 0.30–0.70)

## 2019-06-18 LAB — PROCALCITONIN: Procalcitonin: <0.1 ng/mL

## 2019-06-18 LAB — OCCULT BLOOD, POC DEVICE: Fecal Occult Bld: NEGATIVE

## 2019-06-18 MED ORDER — APIXABAN 5 MG PO TABS: 5.0000 mg | ORAL_TABLET | Freq: Two times a day (BID) | ORAL | Status: DC

## 2019-06-18 MED ORDER — POLYETHYLENE GLYCOL 3350 17 G PO PACK
17.0000 g | PACK | Freq: Every day | ORAL | Status: DC
  Administered 2019-06-19: 17 g via ORAL
  Filled 2019-06-18: qty 1

## 2019-06-18 MED ORDER — MAGNESIUM SULFATE IN D5W 1-5 GM/100ML-% IV SOLN
1.0000 g | Freq: Once | INTRAVENOUS | Status: AC
  Administered 2019-06-18: 1 g via INTRAVENOUS
  Filled 2019-06-18: qty 100

## 2019-06-18 MED ORDER — SODIUM CHLORIDE 0.9 % IV SOLN
INTRAVENOUS | Status: DC | PRN
  Administered 2019-06-18 – 2019-06-19 (×2): 250 mL via INTRAVENOUS

## 2019-06-18 MED ORDER — HEPARIN (PORCINE) 25000 UT/250ML-% IV SOLN
1500.0000 [IU]/h | INTRAVENOUS | Status: AC
  Administered 2019-06-18: 1500 [IU]/h via INTRAVENOUS

## 2019-06-18 MED ORDER — APIXABAN 5 MG PO TABS
10.0000 mg | ORAL_TABLET | Freq: Two times a day (BID) | ORAL | Status: DC
  Administered 2019-06-18 – 2019-06-19 (×3): 10 mg via ORAL
  Filled 2019-06-18 (×3): qty 2

## 2019-06-18 NOTE — Progress Notes (Signed)
ANTICOAGULATION CONSULT NOTE - follow up  Pharmacy Consult for Heparin Indication: pulmonary embolus  Allergies  Allergen Reactions  . Norco [Hydrocodone-Acetaminophen] Other (See Comments)    Made pt feel jittery     Patient Measurements: Height: 5\' 6"  (167.6 cm) Weight: 85 kg (187 lb 6.3 oz) IBW/kg (Calculated) : 63.8 Heparin Dosing Weight: 81.3 kg  Vital Signs: Temp: 97.5 F (36.4 C) (05/16 0534) Temp Source: Oral (05/16 0534) BP: 97/69 (05/16 0534) Pulse Rate: 83 (05/16 0534)  Labs: Recent Labs    06/15/19 2200 06/15/19 2200 06/16/19 0010 06/16/19 1517 06/16/19 1517 06/16/19 1845 06/17/19 0058 06/17/19 0800 06/18/19 0545  HGB 14.6   < >  --  14.7   < >  --  12.7*  --  11.9*  HCT 45.2   < >  --  44.7  --   --  39.2  --  36.5*  PLT 325   < >  --  280  --   --  253  --  207  APTT  --   --   --  37*  --  39*  --   --   --   LABPROT 13.3  --   --  14.0  --   --   --   --   --   INR 1.1  --   --  1.1  --   --   --   --   --   HEPARINUNFRC  --   --   --   --   --   --  0.54 0.48 0.31  CREATININE 1.02  --   --  0.87  --   --   --   --  0.84  TROPONINIHS 7  --  7 4  --   --   --   --   --    < > = values in this interval not displayed.    Estimated Creatinine Clearance: 92 mL/min (by C-G formula based on SCr of 0.84 mg/dL).   Medical History: Past Medical History:  Diagnosis Date  . Cancer ([place])   . Hyperlipidemia   . Pneumonia   . Tobacco abuse     Medications:  Scheduled:  . atorvastatin  20 mg Oral Daily  . guaiFENesin  600 mg Oral BID  . ipratropium-albuterol  3 mL Nebulization BID  . lidocaine  1 patch Transdermal Q24H  . polyethylene glycol  17 g Oral Daily  . senna-docusate  1 tablet Oral BID  . sucralfate  1 g Oral BID  . umeclidinium bromide  1 puff Inhalation Daily   Infusions:  . sodium chloride 250 mL (06/18/19 0150)  . ceFEPime (MAXIPIME) IV 2 g (06/18/19 0151)  . heparin 1,400 Units/hr (06/18/19 0153)  . magnesium sulfate bolus IVPB      PRN:   Assessment: 64 yo male with NSCLC on chemotherapy presents with chest pain, cough and shortness of breath.  Found to have +PE, pharmacy consulted to dose IV heparin.  No anticoagulants PTA; endorses prn aspirin and ibuprofen  Today, 06/18/19  Heparin level therapeutic but trending down on current IV heparin rate of 1400 units/hr  Hgb 14.7 > 12.7 > 11.9, Plts stable  No reported bleeding  Goal of Therapy:  Heparin level 0.3-0.7 units/ml Monitor platelets by anticoagulation protocol: Yes   Plan:   Increase IV heparin from 1400 units/hr to 1500 units/hr  Recheck heparin level 6 hours after rate increase  Daily heparin level and  CBC  Monitor for signs/symptoms of bleeding   Adrian Saran, PharmD, [place] [phone] 06/18/2019 9:29 AM

## 2019-06-18 NOTE — Discharge Instructions (Signed)
Information on my medicine - ELIQUIS (apixaban)  Why was Eliquis prescribed for you? Eliquis was prescribed to treat blood clots that may have been found in the veins of your legs (deep vein thrombosis) or in your lungs (pulmonary embolism) and to reduce the risk of them occurring again.  What do You need to know about Eliquis ? The starting dose is 10 mg (two 5 mg tablets) taken TWICE daily for the FIRST SEVEN (7) DAYS, then on 06/25/19  the dose is reduced to ONE 5 mg tablet taken TWICE daily.  Eliquis may be taken with or without food.   Try to take the dose about the same time in the morning and in the evening. If you have difficulty swallowing the tablet whole please discuss with your pharmacist how to take the medication safely.  Take Eliquis exactly as prescribed and DO NOT stop taking Eliquis without talking to the doctor who prescribed the medication.  Stopping may increase your risk of developing a new blood clot.  Refill your prescription before you run out.  After discharge, you should have regular check-up appointments with your healthcare provider that is prescribing your Eliquis.    What do you do if you miss a dose? If a dose of ELIQUIS is not taken at the scheduled time, take it as soon as possible on the same day and twice-daily administration should be resumed. The dose should not be doubled to make up for a missed dose.  Important Safety Information A possible side effect of Eliquis is bleeding. You should call your healthcare provider right away if you experience any of the following: ? Bleeding from an injury or your nose that does not stop. ? Unusual colored urine (red or dark brown) or unusual colored stools (red or black). ? Unusual bruising for unknown reasons. ? A serious fall or if you hit your head (even if there is no bleeding).  Some medicines may interact with Eliquis and might increase your risk of bleeding or clotting while on Eliquis. To help  avoid this, consult your healthcare provider or pharmacist prior to using any new prescription or non-prescription medications, including herbals, vitamins, non-steroidal anti-inflammatory drugs (NSAIDs) and supplements.  This website has more information on Eliquis (apixaban): http://[email or web address].com/eliquis/home

## 2019-06-18 NOTE — Progress Notes (Signed)
ANTICOAGULATION CONSULT NOTE - Initial Consult  Pharmacy Consult for apixaban Indication: pulmonary embolus  Allergies  Allergen Reactions  . Norco [Hydrocodone-Acetaminophen] Other (See Comments)    Made pt feel jittery     Patient Measurements: Height: 5\' 6"  (167.6 cm) Weight: 85 kg (187 lb 6.3 oz) IBW/kg (Calculated) : 63.8  Vital Signs: Temp: 97.5 F (36.4 C) (05/16 0534) Temp Source: Oral (05/16 0534) BP: 97/69 (05/16 0534) Pulse Rate: 83 (05/16 0534)  Labs: Recent Labs    06/15/19 2200 06/15/19 2200 06/16/19 0010 06/16/19 1517 06/16/19 1517 06/16/19 1845 06/17/19 0058 06/17/19 0800 06/18/19 0545  HGB 14.6   < >  --  14.7   < >  --  12.7*  --  11.9*  HCT 45.2   < >  --  44.7  --   --  39.2  --  36.5*  PLT 325   < >  --  280  --   --  253  --  207  APTT  --   --   --  37*  --  39*  --   --   --   LABPROT 13.3  --   --  14.0  --   --   --   --   --   INR 1.1  --   --  1.1  --   --   --   --   --   HEPARINUNFRC  --   --   --   --   --   --  0.54 0.48 0.31  CREATININE 1.02  --   --  0.87  --   --   --   --  0.84  TROPONINIHS 7  --  7 4  --   --   --   --   --    < > = values in this interval not displayed.    Estimated Creatinine Clearance: 92 mL/min (by C-G formula based on SCr of 0.84 mg/dL).   Medical History: Past Medical History:  Diagnosis Date  . Cancer ([place])   . Hyperlipidemia   . Pneumonia   . Tobacco abuse     Medications: No anticoagulants PTA  Assessment: 64 yo male with NSCLC on chemotherapy presents with chest pain, cough and shortness of breath.  Found to have +PE, initially started on heparin. Pharmacy consulted to transition anticoagulation from IV heparin to apixaban.   Today, 06/18/19 -CBC: Hgb (11.9) slightly low, decreased. Plt WNL -SCr 0.84, CrCl >60 mL/min.  -No documented bleeding   Plan:   Discontinue heparin at time that apixaban is started  Apixaban 10 mg PO BID x7 days followed by apixaban 5 mg PO BID  CBC q72h  while inpatient.   Monitor for signs/symptoms of bleeding  Philip [name], PharmD 06/18/2019,12:41 PM

## 2019-06-18 NOTE — Progress Notes (Addendum)
PROGRESS NOTE    Philip [name]  YTK:[id number] DOB: 02-Jul-1955 DOA: 06/16/2019 PCP: [name], MD    Chief Complaint  Patient presents with   Chest Pain   Shortness of Breath   Cancer patient    Brief Narrative:   Chief Complaint: Pleuritic right sided chest pain, short of breath, productive cough  HPI: Philip [name] is a 64 y.o. 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  He presented to the ED due to above complaints, he denies fever, he report poor appetite, reports being constipated.  No edema.  Subjective:  Some improvement today, coughing spells with shorter duration, less pleuritic chest pain on the right side Feeding oxygen supplement helped him feeling better Tachycardia has resolved Appetite improved Last BM on Wednesday   Assessment & Plan:   Active Problems:   Adenocarcinoma of right lung, stage 3 (HCC)   PE (pulmonary thromboembolism) (HCC)  Acute PE -CTA showed"Singular pulmonary embolism in the right lower lobe pulmonary artery medially"  -has sinus tachycardia on presentation, this has improved, no hypoxia, though report oxygen supplement helped him feel better -Venous Doppler negative for DVT, echocardiogram left ventricular EF within normal limit, right ventricular systolic function is normal right ventricular size is normal -Transition IV heparin  to Eliquis today -Continue telemetry for another 24 hours - continue IV morphine as needed for pain control   Bilateral pneumonia: SARS-CoV-2 screening  negative Acute on chronic infiltrate scattered throughout the right and left lungs primarily within the upper lobes, with new small right-sided pleural effusion He does has history of MAI infection, case discussed with pulmonology Dr. Tressie Stalker who recommend get respiratory viral panel, urine strep pneumo, procalcitonin which are all wnl, urine legionella in process,  Dr Lynford Citizen recommended Treat as bacterial infection for now if no improvement will need to call pulmonology for formal consult, if he improves can have outpatient follow-up with pulmonology for MAI infection management -MRSA screening negative, sputum specimen first collection not acceptable for testing, repeat sputum culture in process,   - DC vancomycin due to  MRSA screening negative, continue cefepime, follow-up sputum culture   COPD; no wheezing.  No hypoxia  Constipation, start stool softener, encourage ambulation  Stage III adenocarcinoma of the lung, last immunotherapy on May 12 He follows oncology Dr. Julien Nordmann,  I sent message through epic to Dr. Julien Nordmann and added Dr. Julien Nordmann to care team list.  DVT prophylaxis:       Eliquis Code Status:              Full Family Communication:       Patient Disposition Plan:              Patient is from:                        Home             Anticipated DC to:                   Home  Anticipated DC date:               24-48hrs             Anticipated DC barriers:          Awaiting for clinical improvement and sputum culture result  Consults :      Case discussed with pulmonary Dr. Tressie Stalker over the phone on May 14   Procedures:   None  Antimicrobials:    vanco from May 14 to May 15 Cefepime from May 14    Objective: Vitals:   06/17/19 1306 06/17/19 2027 06/17/19 2049 06/18/19 0534  BP: 98/70  101/68 97/69  Pulse: 87  (!) 104 83  Resp: 20  20 18   Temp: 99.1 F (37.3 C)  98 F (36.7 C) (!) 97.5 F (36.4 C)  TempSrc: Oral  Oral Oral  SpO2: 95% 94%  94% 95%  Weight:      Height:        Intake/Output Summary (Last 24 hours) at 06/18/2019 0851 Last data filed at 06/18/2019 0200 Gross per 24 hour  Intake 1820.02 ml  Output --  Net 1820.02 ml   Filed Weights   06/16/19 1410  Weight: 85 kg    Examination:  General exam: less distress with less  right-sided pleuritic chest pain and less severe coughing spells Respiratory system: Diminished on the right, no wheezing, left side unremarkable Cardiovascular system: S1 & S2 heard, RRR. No JVD, no murmur, No pedal edema. Gastrointestinal system: Abdomen is nondistended, soft and nontender. No organomegaly or masses felt. Normal bowel sounds heard. Central nervous system: Alert and oriented. No focal neurological deficits. Extremities: Symmetric 5 x 5 power. Skin: No rashes, lesions or ulcers Psychiatry: Judgement and insight appear normal. Mood & affect appropriate.     Data Reviewed: I have personally reviewed following labs and imaging studies  CBC: Recent Labs  Lab 06/14/19 0900 06/15/19 2200 06/16/19 1517 06/17/19 0058 06/18/19 0545  WBC 9.0 11.1* 12.4* 10.4 7.0  NEUTROABS 7.4  --   --   --   --   HGB 15.0 14.6 14.7 12.7* 11.9*  HCT 45.4 45.2 44.7 39.2 36.5*  MCV 93.8 93.8 93.5 94.5 94.6  PLT 297 325 280 253 654    Basic Metabolic Panel: Recent Labs  Lab 06/14/19 0900 06/15/19 2200 06/16/19 1517 06/18/19 0545  NA 139 135 133* 135  K 4.2 4.3 4.2 3.9  CL 105 100 98 102  CO2 23 24 25 27   GLUCOSE 101* 131* 125* 106*  BUN 18 10 17 14   CREATININE 1.03 1.02 0.87 0.84  CALCIUM 9.2 9.2 8.9 8.4*  MG  --   --   --  1.8    GFR: Estimated Creatinine Clearance: 92 mL/min (by C-G formula based on SCr of 0.84 mg/dL).  Liver Function Tests: Recent Labs  Lab 06/14/19 0900 06/16/19 1517 06/18/19 0545  AST 24 18 17   ALT 32 24 23  ALKPHOS 138* 109 82  BILITOT 0.3 0.6 0.5  PROT 7.5 8.0 6.1*  ALBUMIN 3.3* 3.6 2.7*    CBG: No results for input(s): GLUCAP in the  last 168 hours.   Recent Results (from the past 240 hour(s))  Culture, blood (routine x 2)     Status: None (Preliminary result)   Collection Time: 06/16/19  3:17 PM   Specimen: BLOOD  Result Value Ref Range Status   Specimen Description   Final    BLOOD BLOOD LEFT HAND Performed at [facility]  [place] [address] [place], [place] [address]    Special Requests   Final    BOTTLES DRAWN AEROBIC AND ANAEROBIC Blood Culture adequate volume Performed at [place] [address], [place], [place] [address]    Culture   Final    NO GROWTH 2 DAYS Performed at [place] [address], [place], [place] [address]    Report Status PENDING  Incomplete  Culture, blood (routine x 2)     Status: None (Preliminary result)   Collection Time: 06/16/19  3:17 PM   Specimen: BLOOD  Result Value Ref Range Status   Specimen Description   Final    BLOOD BLOOD RIGHT HAND Performed at [place] [address], [place], [place] [address]    Special Requests   Final    BOTTLES DRAWN AEROBIC AND ANAEROBIC Blood Culture adequate volume Performed at [facility] [address] [place], [place] [address]    Culture   Final    NO GROWTH 2 DAYS Performed at [place] [address], [place], [place] [address]    Report Status PENDING  Incomplete  SARS Coronavirus 2 by RT PCR (hospital order, performed in [facility] hospital lab) Nasopharyngeal Nasopharyngeal Swab     Status: None   Collection Time: 06/16/19  3:17 PM   Specimen: Nasopharyngeal Swab  Result Value Ref Range Status   SARS Coronavirus 2 NEGATIVE NEGATIVE Final    Comment: (NOTE) SARS-CoV-2 target nucleic acids are NOT DETECTED. The SARS-CoV-2 RNA is generally detectable in upper and lower respiratory specimens during the acute phase of infection. The lowest concentration of SARS-CoV-2 viral copies this assay can detect is 250 copies /  mL. A negative result does not preclude SARS-CoV-2 infection and should not be used as the sole basis for treatment or other patient management decisions.  A negative result may occur with improper specimen collection / handling, submission of specimen other than nasopharyngeal swab, presence of viral mutation(s) within the areas targeted by this assay, and inadequate number of viral copies (<250 copies / mL). A negative result must be combined with clinical observations, patient history, and epidemiological information. Fact Sheet for Patients:   StrictlyIdeas.no Fact Sheet for Healthcare Providers: BankingDealers.co.za This test is not yet approved or cleared  by the [place] FDA and has been authorized for detection and/or diagnosis of SARS-CoV-2 by FDA under an Emergency Use Authorization (EUA).  This EUA will remain in effect (meaning this test can be used) for the duration of the COVID-19 declaration under Section 564(b)(1) of the Act, 21 U.S.C. section 360bbb-3(b)(1), unless the authorization is terminated or revoked sooner. Performed at [facility], [place] [address], [place], [place] [address]   MRSA PCR Screening     Status: None   Collection Time: 06/16/19  6:46 PM   Specimen: Nasopharyngeal  Result Value Ref Range Status   MRSA by PCR NEGATIVE NEGATIVE Final    Comment:        The GeneXpert MRSA Assay (FDA approved for NASAL specimens only), is one component of a comprehensive MRSA colonization surveillance program. It is not intended to diagnose MRSA infection nor to guide or monitor treatment for MRSA infections. Performed at [facility], [place] [address] [place], [place] [address]   Respiratory Panel by PCR     Status: None   Collection Time: 06/16/19  6:46 PM   Specimen: Respiratory  Result Value Ref Range Status   Adenovirus NOT DETECTED NOT DETECTED Final   Coronavirus  229E NOT DETECTED NOT DETECTED Final    Comment: (NOTE) The Coronavirus on the Respiratory Panel, DOES NOT test for the novel  Coronavirus (2019 nCoV)    Coronavirus HKU1 NOT DETECTED NOT DETECTED Final   Coronavirus NL63 NOT DETECTED NOT DETECTED Final   Coronavirus OC43 NOT DETECTED NOT DETECTED Final   Metapneumovirus NOT DETECTED NOT DETECTED Final   Rhinovirus / Enterovirus NOT DETECTED NOT DETECTED Final   Influenza A NOT DETECTED NOT DETECTED Final   Influenza B NOT DETECTED NOT DETECTED Final   Parainfluenza Virus 1 NOT DETECTED NOT DETECTED Final   Parainfluenza Virus 2 NOT DETECTED NOT DETECTED Final   Parainfluenza Virus 3 NOT DETECTED NOT DETECTED Final   Parainfluenza Virus 4 NOT DETECTED NOT DETECTED Final   Respiratory Syncytial Virus NOT DETECTED NOT DETECTED Final   Bordetella pertussis NOT DETECTED NOT DETECTED Final   Chlamydophila pneumoniae NOT DETECTED NOT DETECTED Final   Mycoplasma pneumoniae NOT DETECTED NOT DETECTED Final    Comment: Performed at [facility], [place] [address], [place], [place] [address]  Expectorated sputum assessment w rflx to resp cult     Status: None   Collection Time: 06/17/19  9:52 AM   Specimen: Expectorated Sputum  Result Value Ref Range Status   Specimen Description EXPECTORATED SPUTUM  Final   Special Requests NONE  Final   Sputum evaluation   Final    Sputum specimen not acceptable for testing.  Please recollect.   NOTIFIED K,RICHARDSON @1108  ON 06/17/19 KJ Performed at [facility], [place] [address] [place], [place] [address]    Report Status 06/17/2019 FINAL  Final  Expectorated sputum assessment w rflx to resp cult     Status: None   Collection Time: 06/17/19  2:12 PM   Specimen: Sputum  Result Value Ref Range Status   Specimen Description SPU  Final   Special Requests NONE  Final   Sputum evaluation   Final    THIS SPECIMEN IS ACCEPTABLE FOR SPUTUM CULTURE Performed at [facility], [place] [address], [place], [place] [address]    Report Status 06/17/2019 FINAL  Final  Culture, respiratory     Status: None (Preliminary result)   Collection Time: 06/17/19  2:12 PM   Specimen: Sputum  Result Value Ref Range Status   Specimen Description   Final    SPU Performed at [place] [address] [place], [place] [address]    Special Requests   Final    NONE Reflexed from [id number] Performed at [facility], [name] [address] [place], [place] [address]    Gram Stain   Final    FEW WBC PRESENT, PREDOMINANTLY PMN RARE GRAM POSITIVE COCCI Performed at [facility], [place] [address] [place], [place] [address]    Culture PENDING  Incomplete   Report Status PENDING  Incomplete         Radiology Studies: DG Chest 2 View  Result Date: 06/16/2019 CLINICAL DATA:  Chest pain and shortness of breath. Lung cancer patient. EXAM: CHEST - 2 VIEW COMPARISON:  06/15/2019 FINDINGS: Worsening of widespread pulmonary density which could be worsening pneumonia, more likely than edema. No dense consolidation, collapse or effusion. IMPRESSION: Worsening bilateral pulmonary density worrisome for worsening pneumonia. Electronically Signed   By: Nelson Chimes M.D.   On: 06/16/2019 14:55   CT  Angio Chest PE W and/or Wo Contrast  Result Date: 06/16/2019 CLINICAL DATA:  Shortness of breath and chest pain on the right with abdominal distension and black tarry stools. EXAM: CT ANGIOGRAPHY CHEST CT ABDOMEN AND PELVIS WITH CONTRAST TECHNIQUE: Multidetector CT imaging of the chest was performed using the standard protocol during bolus administration of intravenous contrast. Multiplanar CT image reconstructions and MIPs were obtained to evaluate the vascular anatomy. Multidetector CT imaging of the abdomen and pelvis was performed using the standard protocol during bolus administration of intravenous contrast. CONTRAST:  166mL OMNIPAQUE IOHEXOL 350 MG/ML  SOLN COMPARISON:  04/10/2019 FINDINGS: CTA CHEST FINDINGS Cardiovascular: Thoracic aorta demonstrates mild atherosclerotic calcifications without aneurysmal dilatation. No cardiac enlargement is seen. No sizable pericardial effusion is noted. The pulmonary artery shows a normal branching pattern. Singular filling defect is noted in the medial right lower lobe best seen on image number 121 of series 5. This is new from a recent CT of the chest from 04/10/2019. No other definitive pulmonary embolism is seen. Mediastinum/Nodes: Thoracic inlet is within normal limits. The esophagus as visualized is unremarkable. Diffuse fullness of the right hilar region is noted similar to that seen on the recent exam. No significant left-sided hilar adenopathy is noted. The esophagus as visualized is within normal limits. Lungs/Pleura: Small right-sided pleural effusion is noted new from the prior exam. Area of masslike architectural distortion in the right perihilar region and right lower lobe is again identified. Some new patchy infiltrative changes noted in the right upper lobe increased when compared with the prior exam. Some similar subpleural infiltrate is noted in the left upper lobe anteriorly. Diffuse emphysematous changes are noted. Musculoskeletal: Degenerative changes of the thoracic spine are noted. No acute bony abnormality is noted. Review of the MIP images confirms the above findings. CT ABDOMEN and PELVIS FINDINGS Hepatobiliary: No focal liver abnormality is seen. No gallstones, gallbladder wall thickening, or biliary dilatation. Pancreas: Unremarkable. No pancreatic ductal dilatation or surrounding inflammatory changes. Spleen: Normal in size without focal abnormality. Adrenals/Urinary Tract: Stable thickening of the right adrenal gland is noted. 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 Kidneys demonstrate a normal enhancement pattern. No renal calculi or obstructive changes are noted.  Delayed images demonstrate normal excretion of contrast material. The bladder is well distended. Stomach/Bowel: The appendix is within normal limits. No obstructive or inflammatory changes of the colon are seen. The stomach is decompressed. No definitive ulcer is seen within the stomach. Small bowel demonstrates some scattered fluid although no obstructive changes are noted. Vascular/Lymphatic: Aortic atherosclerosis. No enlarged abdominal or pelvic lymph nodes. Reproductive: Prostate is unremarkable. Other: No abdominal wall hernia or abnormality. No abdominopelvic ascites. Musculoskeletal: Mild degenerative changes of lumbar spine are seen. Review of the MIP images confirms the above findings. IMPRESSION: CTA of the chest: Singular pulmonary embolism in the right lower lobe pulmonary artery medially new from the prior exam of 04/10/2019 Acute on chronic infiltrate scattered throughout the right and left lungs primarily within the upper lobes. Stable masslike architectural distortion in the right hilum and right lower lobe similar to that seen on the prior exam. New small right-sided pleural effusion. CT of the abdomen and pelvis: No acute abnormality is seen. Electronically Signed   By: Inez Catalina M.D.   On: 06/16/2019 17:07   CT ABDOMEN PELVIS W CONTRAST  Result Date: 06/16/2019 CLINICAL DATA:  Shortness of breath and chest pain on the right with abdominal distension and black tarry stools. EXAM:  CT ANGIOGRAPHY CHEST CT ABDOMEN AND PELVIS WITH CONTRAST TECHNIQUE: Multidetector CT imaging of the chest was performed using the standard protocol during bolus administration of intravenous contrast. Multiplanar CT image reconstructions and MIPs were obtained to evaluate the vascular anatomy. Multidetector CT imaging of the abdomen and pelvis was performed using the standard protocol during bolus administration of intravenous contrast. CONTRAST:  143mL OMNIPAQUE IOHEXOL 350 MG/ML SOLN COMPARISON:  04/10/2019  FINDINGS: CTA CHEST FINDINGS Cardiovascular: Thoracic aorta demonstrates mild atherosclerotic calcifications without aneurysmal dilatation. No cardiac enlargement is seen. No sizable pericardial effusion is noted. The pulmonary artery shows a normal branching pattern. Singular filling defect is noted in the medial right lower lobe best seen on image number 121 of series 5. This is new from a recent CT of the chest from 04/10/2019. No other definitive pulmonary embolism is seen. Mediastinum/Nodes: Thoracic inlet is within normal limits. The esophagus as visualized is unremarkable. Diffuse fullness of the right hilar region is noted similar to that seen on the recent exam. No significant left-sided hilar adenopathy is noted. The esophagus as visualized is within normal limits. Lungs/Pleura: Small right-sided pleural effusion is noted new from the prior exam. Area of masslike architectural distortion in the right perihilar region and right lower lobe is again identified. Some new patchy infiltrative changes noted in the right upper lobe increased when compared with the prior exam. Some similar subpleural infiltrate is noted in the left upper lobe anteriorly. Diffuse emphysematous changes are noted. Musculoskeletal: Degenerative changes of the thoracic spine are noted. No acute bony abnormality is noted. Review of the MIP images confirms the above findings. CT ABDOMEN and PELVIS FINDINGS Hepatobiliary: No focal liver abnormality is seen. No gallstones, gallbladder wall thickening, or biliary dilatation. Pancreas: Unremarkable. No pancreatic ductal dilatation or surrounding inflammatory changes. Spleen: Normal in size without focal abnormality. Adrenals/Urinary Tract: Stable thickening of the right adrenal gland is noted. 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 Kidneys demonstrate a normal enhancement pattern. No renal calculi or obstructive changes are noted. Delayed images demonstrate  normal excretion of contrast material. The bladder is well distended. Stomach/Bowel: The appendix is within normal limits. No obstructive or inflammatory changes of the colon are seen. The stomach is decompressed. No definitive ulcer is seen within the stomach. Small bowel demonstrates some scattered fluid although no obstructive changes are noted. Vascular/Lymphatic: Aortic atherosclerosis. No enlarged abdominal or pelvic lymph nodes. Reproductive: Prostate is unremarkable. Other: No abdominal wall hernia or abnormality. No abdominopelvic ascites. Musculoskeletal: Mild degenerative changes of lumbar spine are seen. Review of the MIP images confirms the above findings. IMPRESSION: CTA of the chest: Singular pulmonary embolism in the right lower lobe pulmonary artery medially new from the prior exam of 04/10/2019 Acute on chronic infiltrate scattered throughout the right and left lungs primarily within the upper lobes. Stable masslike architectural distortion in the right hilum and right lower lobe similar to that seen on the prior exam. New small right-sided pleural effusion. CT of the abdomen and pelvis: No acute abnormality is seen. Electronically Signed   By: Inez Catalina M.D.   On: 06/16/2019 17:07   ECHOCARDIOGRAM COMPLETE  Result Date: 06/17/2019    ECHOCARDIOGRAM REPORT   Patient Name:   BLEU MINERD Date of Exam: 06/17/2019 Medical Rec #:  [id number]        Height:       66.0 in Accession #:    [id number]       Weight:  187.4 lb Date of Birth:  [birth year]        BSA:          1.945 m Patient Age:    9 years         BP:           92/68 mmHg Patient Gender: M                HR:           95 bpm. Exam Location:  Inpatient Procedure: 2D Echo Indications:    Pulmonary Embolus I26.99  History:        Patient has no prior history of Echocardiogram examinations.                 Risk Factors:Current Smoker and Dyslipidemia.  Sonographer:    Mikki Santee RDCS (AE) Referring Phys: [id number] [place]  1. Left ventricular ejection fraction, by estimation, is 60 to 65%. The left ventricle has normal function. The left ventricle has no regional wall motion abnormalities. Left ventricular diastolic parameters were normal.  2. Right ventricular systolic function is normal. The right ventricular size is normal.  3. The mitral valve is normal in structure. Trivial mitral valve regurgitation. No evidence of mitral stenosis.  4. The aortic valve is tricuspid. Aortic valve regurgitation is not visualized. Mild aortic valve sclerosis is present, with no evidence of aortic valve stenosis.  5. The inferior vena cava is normal in size with greater than 50% respiratory variability, suggesting right atrial pressure of 3 mmHg. FINDINGS  Left Ventricle: Left ventricular ejection fraction, by estimation, is 60 to 65%. The left ventricle has normal function. The left ventricle has no regional wall motion abnormalities. The left ventricular internal cavity size was normal in size. There is  no left ventricular hypertrophy. Left ventricular diastolic parameters were normal. Right Ventricle: The right ventricular size is normal. No increase in right ventricular wall thickness. Right ventricular systolic function is normal. Left Atrium: Left atrial size was normal in size. Right Atrium: Right atrial size was normal in size. Pericardium: There is no evidence of pericardial effusion. The pericardial effusion is posterior to the left ventricle. Mitral Valve: The mitral valve is normal in structure. Normal mobility of the mitral valve leaflets. Trivial mitral valve regurgitation. No evidence of mitral valve stenosis. Tricuspid Valve: The tricuspid valve is normal in structure. Tricuspid valve regurgitation is mild . No evidence of tricuspid stenosis. Aortic Valve: The aortic valve is tricuspid. Aortic valve regurgitation is not visualized. Mild aortic valve sclerosis is present, with no evidence of aortic valve stenosis.  Pulmonic Valve: The pulmonic valve was normal in structure. Pulmonic valve regurgitation is not visualized. No evidence of pulmonic stenosis. Aorta: The aortic root is normal in size and structure. Venous: The inferior vena cava is normal in size with greater than 50% respiratory variability, suggesting right atrial pressure of 3 mmHg. IAS/Shunts: The interatrial septum was not well visualized.  LEFT VENTRICLE PLAX 2D LVIDd:         5.10 cm  Diastology LVIDs:         3.40 cm  LV e' lateral:   12.30 cm/s LV PW:         1.00 cm  LV E/e' lateral: 3.7 LV IVS:        1.00 cm  LV e' medial:    7.62 cm/s LVOT diam:     2.20 cm  LV E/e' medial:  5.9 LV SV:  68 LV SV Index:   35 LVOT Area:     3.80 cm  RIGHT VENTRICLE RV S prime:     11.50 cm/s TAPSE (M-mode): 1.4 cm LEFT ATRIUM             Index       RIGHT ATRIUM           Index LA diam:        3.20 cm 1.65 cm/m  RA Area:     11.70 cm LA Vol (A2C):   18.7 ml 9.61 ml/m  RA Volume:   17.10 ml  8.79 ml/m LA Vol (A4C):   36.5 ml 18.76 ml/m LA Biplane Vol: 27.9 ml 14.34 ml/m  AORTIC VALVE LVOT Vmax:   92.40 cm/s LVOT Vmean:  60.400 cm/s LVOT VTI:    0.180 m  AORTA Ao Root diam: 3.60 cm MITRAL VALVE MV Area (PHT): 2.71 cm    SHUNTS MV Decel Time: 280 msec    Systemic VTI:  0.18 m MV E velocity: 44.90 cm/s  Systemic Diam: 2.20 cm MV A velocity: 54.80 cm/s MV E/A ratio:  0.82 Jenkins Rouge MD Electronically signed by Jenkins Rouge MD Signature Date/Time: 06/17/2019/10:38:16 AM    Final    VAS [place] LOWER EXTREMITY VENOUS (DVT)  Result Date: 06/17/2019  Lower Venous DVTStudy Indications: Pulmonary embolism.  Risk Factors: Lung cancer. Comparison Study: none Performing Technologist: June Leap RDMS, RVT  Examination Guidelines: A complete evaluation includes B-mode imaging, spectral Doppler, color Doppler, and power Doppler as needed of all accessible portions of each vessel. Bilateral testing is considered an integral part of a complete examination. Limited  examinations for reoccurring indications may be performed as noted. The reflux portion of the exam is performed with the patient in reverse Trendelenburg.  +---------+---------------+---------+-----------+----------+--------------+  RIGHT     Compressibility Phasicity Spontaneity Properties Thrombus Aging  +---------+---------------+---------+-----------+----------+--------------+  CFV       Full            Yes       Yes                                    +---------+---------------+---------+-----------+----------+--------------+  SFJ       Full                                                             +---------+---------------+---------+-----------+----------+--------------+  FV Prox   Full                                                             +---------+---------------+---------+-----------+----------+--------------+  FV Mid    Full                                                             +---------+---------------+---------+-----------+----------+--------------+  FV Distal Full                                                             +---------+---------------+---------+-----------+----------+--------------+  PFV       Full                                                             +---------+---------------+---------+-----------+----------+--------------+  POP       Full            Yes       Yes                                    +---------+---------------+---------+-----------+----------+--------------+  PTV       Full                                                             +---------+---------------+---------+-----------+----------+--------------+  PERO      Full                                                             +---------+---------------+---------+-----------+----------+--------------+   +---------+---------------+---------+-----------+----------+--------------+  LEFT      Compressibility Phasicity Spontaneity Properties Thrombus Aging   +---------+---------------+---------+-----------+----------+--------------+  CFV       Full            Yes       Yes                                    +---------+---------------+---------+-----------+----------+--------------+  SFJ       Full                                                             +---------+---------------+---------+-----------+----------+--------------+  FV Prox   Full                                                             +---------+---------------+---------+-----------+----------+--------------+  FV Mid    Full                                                             +---------+---------------+---------+-----------+----------+--------------+  FV Distal Full                                                             +---------+---------------+---------+-----------+----------+--------------+  PFV       Full                                                             +---------+---------------+---------+-----------+----------+--------------+  POP       Full            Yes       Yes                                    +---------+---------------+---------+-----------+----------+--------------+  PTV       Full                                                             +---------+---------------+---------+-----------+----------+--------------+  PERO      Full                                                             +---------+---------------+---------+-----------+----------+--------------+     Summary: BILATERAL: - No evidence of deep vein thrombosis seen in the lower extremities, bilaterally. -No evidence of popliteal cyst, bilaterally.   *See table(s) above for measurements and observations.    Preliminary         Scheduled Meds:  atorvastatin  20 mg Oral Daily   guaiFENesin  600 mg Oral BID   ipratropium-albuterol  3 mL Nebulization BID   lidocaine  1 patch Transdermal Q24H   polyethylene glycol  17 g Oral Daily   senna-docusate  1 tablet Oral BID   sucralfate   1 g Oral BID   umeclidinium bromide  1 puff Inhalation Daily   Continuous Infusions:  sodium chloride 250 mL (06/18/19 0150)   ceFEPime (MAXIPIME) IV 2 g (06/18/19 0151)   heparin 1,400 Units/hr (06/18/19 0153)     LOS: 2 days     Time spent: 44mins I have personally reviewed and interpreted on  06/18/2019 daily labs, tele strips, imagings as discussed above under date review session and assessment and plans.  I reviewed all nursing notes, pharmacy notes,   vitals, pertinent old records  I have discussed plan of care as described above with RN , patient  on 06/18/2019  Voice Recognition /Dragon dictation system was used to create this note, attempts have been made to correct errors. Please contact the author with questions and/or clarifications.   Florencia Reasons, MD PhD FACP [facility]  Available via Epic secure chat 7am-7pm for nonurgent issues Please page for urgent issues To page the attending provider between 7A-7P or the covering provider during after hours 7P-7A, please log into the web site [email or web address] and access using universal [place] password for that web site. If you do not have the password, please call the hospital operator.    06/18/2019, 8:51 AM

## 2019-06-19 DIAGNOSIS — J9600 Acute respiratory failure, unspecified whether with hypoxia or hypercapnia: Secondary | ICD-10-CM

## 2019-06-19 DIAGNOSIS — C3491 Malignant neoplasm of unspecified part of right bronchus or lung: Secondary | ICD-10-CM

## 2019-06-19 DIAGNOSIS — J440 Chronic obstructive pulmonary disease with acute lower respiratory infection: Secondary | ICD-10-CM

## 2019-06-19 DIAGNOSIS — J189 Pneumonia, unspecified organism: Secondary | ICD-10-CM

## 2019-06-19 DIAGNOSIS — J9601 Acute respiratory failure with hypoxia: Secondary | ICD-10-CM

## 2019-06-19 DIAGNOSIS — J9621 Acute and chronic respiratory failure with hypoxia: Secondary | ICD-10-CM

## 2019-06-19 DIAGNOSIS — I2699 Other pulmonary embolism without acute cor pulmonale: Principal | ICD-10-CM

## 2019-06-19 DIAGNOSIS — J209 Acute bronchitis, unspecified: Secondary | ICD-10-CM

## 2019-06-19 DIAGNOSIS — A310 Pulmonary mycobacterial infection: Secondary | ICD-10-CM

## 2019-06-19 DIAGNOSIS — J449 Chronic obstructive pulmonary disease, unspecified: Secondary | ICD-10-CM

## 2019-06-19 DIAGNOSIS — J441 Chronic obstructive pulmonary disease with (acute) exacerbation: Secondary | ICD-10-CM

## 2019-06-19 LAB — LEGIONELLA PNEUMOPHILA SEROGP 1 UR AG: L. pneumophila Serogp 1 Ur Ag: NEGATIVE

## 2019-06-19 LAB — BASIC METABOLIC PANEL
Anion gap: 7 (ref 5–15)
BUN: 12 mg/dL (ref 8–23)
CO2: 28 mmol/L (ref 22–32)
Calcium: 8.6 mg/dL — ABNORMAL LOW (ref 8.9–10.3)
Chloride: 100 mmol/L (ref 98–111)
Creatinine, Ser: 0.76 mg/dL (ref 0.61–1.24)
GFR calc Af Amer: >60 mL/min (ref >60)
GFR calc non Af Amer: >60 mL/min (ref >60)
Glucose, Bld: 104 mg/dL — ABNORMAL HIGH (ref 70–99)
Potassium: 4 mmol/L (ref 3.5–5.1)
Sodium: 135 mmol/L (ref 135–145)

## 2019-06-19 LAB — CBC
HCT: 41.2 % (ref 39.0–52.0)
Hemoglobin: 13.3 g/dL (ref 13.0–17.0)
MCH: 30.6 pg (ref 26.0–34.0)
MCHC: 32.3 g/dL (ref 30.0–36.0)
MCV: 94.9 fL (ref 80.0–100.0)
Platelets: 244 10*3/uL (ref 150–400)
RBC: 4.34 MIL/uL (ref 4.22–5.81)
RDW: 12 % (ref 11.5–15.5)
WBC: 6.3 10*3/uL (ref 4.0–10.5)
nRBC: 0 % (ref 0.0–0.2)

## 2019-06-19 LAB — MAGNESIUM: Magnesium: 2.1 mg/dL (ref 1.7–2.4)

## 2019-06-19 MED ORDER — OXYCODONE-ACETAMINOPHEN 5-325 MG PO TABS: 1.0000 | ORAL_TABLET | Freq: Four times a day (QID) | ORAL | 0 refills | Status: AC | PRN

## 2019-06-19 MED ORDER — AMOXICILLIN-POT CLAVULANATE 875-125 MG PO TABS: 1.0000 | ORAL_TABLET | Freq: Two times a day (BID) | ORAL | 0 refills | Status: AC

## 2019-06-19 MED ORDER — ALBUTEROL SULFATE (2.5 MG/3ML) 0.083% IN NEBU: 2.5000 mg | INHALATION_SOLUTION | Freq: Four times a day (QID) | RESPIRATORY_TRACT | 12 refills | Status: DC | PRN

## 2019-06-19 MED ORDER — COMPRESSOR/NEBULIZER MISC
1.0000 | 0 refills | Status: DC | PRN
Start: 2019-06-19 — End: 2021-01-22

## 2019-06-19 MED ORDER — BENZONATATE 100 MG PO CAPS: 100.0000 mg | ORAL_CAPSULE | Freq: Three times a day (TID) | ORAL | 0 refills | Status: DC | PRN

## 2019-06-19 MED ORDER — LIDOCAINE 5 % EX PTCH: 1.0000 | MEDICATED_PATCH | CUTANEOUS | 0 refills | Status: DC

## 2019-06-19 MED ORDER — APIXABAN (ELIQUIS) VTE STARTER PACK (10MG AND 5MG)
ORAL_TABLET | ORAL | 0 refills | Status: DC
Start: 2019-06-19 — End: 2019-09-06

## 2019-06-19 MED ORDER — OXYCODONE-ACETAMINOPHEN 5-325 MG PO TABS
1.0000 | ORAL_TABLET | Freq: Four times a day (QID) | ORAL | Status: DC | PRN
  Administered 2019-06-19: 1 via ORAL
  Filled 2019-06-19: qty 1

## 2019-06-19 MED ORDER — GUAIFENESIN ER 600 MG PO TB12: 600.0000 mg | ORAL_TABLET | Freq: Two times a day (BID) | ORAL | 0 refills | Status: AC

## 2019-06-19 MED ORDER — SENNOSIDES-DOCUSATE SODIUM 8.6-50 MG PO TABS: 1.0000 | ORAL_TABLET | Freq: Two times a day (BID) | ORAL | 0 refills | Status: AC

## 2019-06-19 MED ORDER — POLYETHYLENE GLYCOL 3350 17 G PO PACK: 17.0000 g | PACK | Freq: Every day | ORAL | 0 refills | Status: DC

## 2019-06-19 NOTE — TOC Progression Note (Signed)
Transition of Care [facility]) - Progression Note    Patient Details  Name: Philip [name] MRN: [id number] Date of Birth: August 21, 1955  [facility] CM/SW Contact  Purcell Mouton, RN Phone Number: 06/19/2019, 3:48 PM  Clinical Narrative:    Pt agreed with O2 from [place].  [facility] will deliver O2 to [facility].         Expected Discharge Plan and Services           Expected Discharge Date: 06/19/19                                     Social Determinants of Health (SDOH) Interventions    Readmission Risk Interventions No flowsheet data found.

## 2019-06-19 NOTE — Discharge Summary (Signed)
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[place])   Respiratory failure, acute ([place])   Adenocarcinoma of right lung, stage 3 (HCC)   PE (pulmonary thromboembolism) ([place])   COPD with acute bronchitis [facility])   Hospital Course Summary: 64 y.o.malewith h/o chronic tinnitis with hearing loss, prior smoker with COPDstage III adenocarcinoma of the lung diagnosed in December 2020 (stopped XRT due to intolerance -severe esophagitis/weight loss,finished 6cycles ofchemotherapy, currently gettingimmunotherapy with PD1 inhibitor every 4 weeks,last dose on May 12) who wasdiagnosed with 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 He denied fever but reported poor appetite/constipation..  ED Course: Afebrile, tachycardic with HR 110s, sinus tachycardia on EKG, SARS-CoV-2 screening test negative. CTA of the chest revealed new PE in the RLL pulmonary artery.Acute on chronic infiltrate scattered throughout the right and left lungs primarily within the upper lobes.New small right-sided pleural effusion.Stable masslike architectural distortion in the right hilum and right lower lobe similar to that seen on the prior exam in March.CT of the abdomen and pelvis: No acute abnormality is seen. Hospital course: Patient admitted to [facility] with heparin drip for PE,given vancomycin and cefepime for pneumonia.  1. Acute right lower lobe PE: Venous Doppler negative for DVT, echocardiogram with LVEF and right ventricular systolic function wnl.IV heparin transitioned to Eliquis 5/16.  Patient advised to avoid goody powders/NSAIDs (listed on home meds) while on anticoagulation and explained bleeding risk.  2. Bilateral RUL/LUL bacterial pneumonia with new small right-sided pleural effusion-He does has history of MAI infection,case discussed with pulmonology Dr. Tressie Stalker who recommend to treat as bacterial infection for now ,if he improves can have outpatient follow-up with pulmonology for MAIinfection management.MRSA screening negative, sputum specimen first collection not acceptable for testing, repeat sputum culturein process. Now off vancomycin due to MRSA screening negative, remains on cefepime since 5/14. Will transition to oral Augmentin upon discharge to complete 7 day course. Respiratory viral panel, urine strep pneumo, procalcitonin are all wnl, urine legionella still in process.patient clinically improved. He does have post tussive bronchospasms, will prescribe antitussives/mucolytics/albuterol nebulizers on discharge  3. COPD/ history MAI in 01/2019: no wheezing. Resume prior inhaler regimen. F/U pulmonary upon discharge-updated pulmonary APP/Dr Mannam regarding discharge plan and need for f/u.  4. Stage IIIc adenocarcinoma of the lung,presented with large right hilar mass in addition to bilateral hilar and mediastinal lymphadenopathy diagnosed in December 2020.He follows oncology as OP- sent message through epic and added Dr. Georgena Spurling care team list-last seen for immunotherapy on May 12. D/W Oncology APP who evaluated pateint today and added opiates for pain control. Advised to avoid NSAIDs  5. Acute hypoxic respiratory failure: Multifactorial In the setting of problems 1, 2, 3 and 4 as listed above. currently saturating well on 2 lits o2 via [place].Will obtain home o2 evaluation prior to discharge.   Discharge Exam:  Vitals:   06/19/19 0530 06/19/19 0829  BP: 105/73   Pulse: 81   Resp: 18   Temp: 98.5 F (36.9 C)   SpO2: 95% 96%   Vitals:   06/18/19 2046  06/18/19 2049 06/19/19 0530 06/19/19 0829  BP: 117/71  105/73   Pulse: 98  81   Resp: 18  18   Temp: 97.7 F (36.5 C)  98.5 F (36.9 C)   TempSrc: Oral  Oral   SpO2: 93% 94% 95% 96%  Weight:      Height:        General: Pt is alert, awake, not in acute distress Cardiovascular: RRR, S1/S2 +, no rubs, no gallops Respiratory: CTA bilaterally, some post tussive wheezing, no rhonchi Abdominal: Soft, NT, ND, bowel sounds + Extremities: no edema, no cyanosis  Discharge Condition:Stable CODE STATUS: Full code Diet recommendation: low salt diet Recommendations for Outpatient Follow-up: PCP in 3 days, [facility] Pulmonary clinic in 1 week, Oncology clinic as scheduled in 10 days   [place] services upon discharge: none Equipment/Devices upon discharge: nebulizer/home 02   Discharge Instructions:  Discharge Instructions    Call MD for:  difficulty breathing, headache or visual disturbances   Complete by: As directed    Call MD for:  extreme fatigue   Complete by: As directed    Call MD for:  persistant dizziness or light-headedness   Complete by: As directed    Call MD for:  persistant nausea and vomiting   Complete by: As directed    Call MD for:  severe uncontrolled pain   Complete by: As directed    Call MD for:  temperature >100.4   Complete by: As directed    Diet - low sodium heart healthy   Complete by: As directed    Increase activity slowly   Complete by: As directed      Allergies as of 06/19/2019      Reactions   Norco [hydrocodone-acetaminophen] Other (See Comments)   Made pt feel jittery       Medication List    STOP taking these medications   aspirin 325 MG tablet   GOODY HEADACHE PO   ibuprofen 200 MG tablet Commonly known as: ADVIL   lidocaine 2 % solution Commonly known as: XYLOCAINE   prochlorperazine 10 MG tablet Commonly known as: COMPAZINE     TAKE these medications   albuterol 108 (90 Base) MCG/ACT inhaler Commonly known as: VENTOLIN  HFA Inhale 2 puffs into the lungs every 4 (four) hours as needed for wheezing or shortness of breath. What changed: Another medication with the same name was added. Make sure you understand how and when to take each.   albuterol (2.5 MG/3ML) 0.083% nebulizer solution Commonly known as: PROVENTIL Take 3 mLs (2.5 mg total) by nebulization every 6 (six) hours as needed for wheezing or shortness of breath. What changed: You were already taking a medication with the same name, and this prescription was added. Make sure you understand how and when to take each.   amoxicillin-clavulanate 875-125 MG tablet Commonly known as: Augmentin Take 1 tablet by mouth 2 (two) times daily for 5 days.   Apixaban Starter Pack (10mg  and 5mg ) Commonly known as: ELIQUIS STARTER PACK Take as directed on package: start with two-5mg  tablets twice daily for 7 days. On day 8, switch to one-5mg  tablet twice daily.   atorvastatin 20 MG tablet Commonly known as: LIPITOR Take 20 mg by mouth daily.   benzonatate 100 MG capsule Commonly known as: TESSALON Take 1 capsule (100 mg total) by mouth 3 (three) times daily as needed for cough.   bismuth subsalicylate 932 TF/57DU suspension Commonly known as: PEPTO BISMOL Take 30 mLs by mouth every 6 (six) hours as needed for indigestion.   Compressor/Nebulizer Misc 1 application by Does  not apply route every 4 (four) hours as needed.   famotidine 20 MG tablet Commonly known as: PEPCID Take 20 mg by mouth 2 (two) times daily.   guaiFENesin 600 MG 12 hr tablet Commonly known as: MUCINEX Take 1 tablet (600 mg total) by mouth 2 (two) times daily for 14 days.   lidocaine 5 % Commonly known as: LIDODERM Place 1 patch onto the skin daily. Remove & Discard patch within 12 hours or as directed by MD   omeprazole-sodium bicarbonate 40-1100 MG capsule Commonly known as: ZEGERID Take 1 capsule by mouth daily before breakfast.   oxyCODONE-acetaminophen 5-325 MG  tablet Commonly known as: PERCOCET/ROXICET Take 1 tablet by mouth every 6 (six) hours as needed for up to 5 days for moderate pain.   polyethylene glycol 17 g packet Commonly known as: MIRALAX / GLYCOLAX Take 17 g by mouth daily. Start taking on: Jun 20, 2019   senna-docusate 8.6-50 MG tablet Commonly known as: Senokot-S Take 1 tablet by mouth 2 (two) times daily.   sildenafil 20 MG tablet Commonly known as: REVATIO Take 20-60 mg by mouth daily as needed (ED).   Spiriva Respimat 1.25 MCG/ACT Aers Generic drug: Tiotropium Bromide Monohydrate Inhale 1 puff into the lungs daily. What changed: Another medication with the same name was removed. Continue taking this medication, and follow the directions you see here.   sucralfate 1 g tablet Commonly known as: Carafate Take 1 tablet (1 g total) by mouth 4 (four) times daily -  with meals and at bedtime. Dissolve in 10 mL of warm water prior to swallowing   VISINE OP Place 1 drop into both eyes daily as needed (redness).            Durable Medical Equipment  (From admission, onward)         Start     Ordered   06/19/19 1438  DME Oxygen  Once    Question Answer Comment  Length of Need 6 Months   Mode or (Route) Nasal cannula   Liters per Minute 2   Frequency Continuous (stationary and portable oxygen unit needed)   Oxygen conserving device Yes   Oxygen delivery system Gas      06/19/19 1440          Allergies  Allergen Reactions  . Norco [Hydrocodone-Acetaminophen] Other (See Comments)    Made pt feel jittery       The results of significant diagnostics from this hospitalization (including imaging, microbiology, ancillary and laboratory) are listed below for reference.    Labs: BNP (last 3 results) Recent Labs    06/16/19 1517  BNP 04.8   Basic Metabolic Panel: Recent Labs  Lab 06/14/19 0900 06/15/19 2200 06/16/19 1517 06/18/19 0545 06/19/19 0351  NA 139 135 133* 135 135  K 4.2 4.3 4.2 3.9 4.0   CL 105 100 98 102 100  CO2 23 24 25 27 28   GLUCOSE 101* 131* 125* 106* 104*  BUN 18 10 17 14 12   CREATININE 1.03 1.02 0.87 0.84 0.76  CALCIUM 9.2 9.2 8.9 8.4* 8.6*  MG  --   --   --  1.8 2.1   Liver Function Tests: Recent Labs  Lab 06/14/19 0900 06/16/19 1517 06/18/19 0545  AST 24 18 17   ALT 32 24 23  ALKPHOS 138* 109 82  BILITOT 0.3 0.6 0.5  PROT 7.5 8.0 6.1*  ALBUMIN 3.3* 3.6 2.7*   No results for input(s): LIPASE, AMYLASE in the last 168 hours.  No results for input(s): AMMONIA in the last 168 hours. CBC: Recent Labs  Lab 06/14/19 0900 06/14/19 0900 06/15/19 2200 06/16/19 1517 06/17/19 0058 06/18/19 0545 06/19/19 0351  WBC 9.0  --  11.1* 12.4* 10.4 7.0 6.3  NEUTROABS 7.4  --   --   --   --   --   --   HGB 15.0  --  14.6 14.7 12.7* 11.9* 13.3  HCT 45.4   < > 45.2 44.7 39.2 36.5* 41.2  MCV 93.8   < > 93.8 93.5 94.5 94.6 94.9  PLT 297  --  325 280 253 207 244   < > = values in this interval not displayed.   Cardiac Enzymes: No results for input(s): CKTOTAL, CKMB, CKMBINDEX, TROPONINI in the last 168 hours. BNP: Invalid input(s): POCBNP CBG: No results for input(s): GLUCAP in the last 168 hours. D-Dimer No results for input(s): DDIMER in the last 72 hours. Hgb A1c No results for input(s): HGBA1C in the last 72 hours. Lipid Profile No results for input(s): CHOL, HDL, LDLCALC, TRIG, CHOLHDL, LDLDIRECT in the last 72 hours. Thyroid function studies No results for input(s): TSH, T4TOTAL, T3FREE, THYROIDAB in the last 72 hours.  Invalid input(s): FREET3 Anemia work up No results for input(s): VITAMINB12, FOLATE, FERRITIN, TIBC, IRON, RETICCTPCT in the last 72 hours. Urinalysis No results found for: COLORURINE, APPEARANCEUR, [place], [place], [place], [place], [place], [place], PROTEINUR, UROBILINOGEN, NITRITE, LEUKOCYTESUR Sepsis Labs Invalid input(s): PROCALCITONIN,  WBC,  LACTICIDVEN Microbiology Recent Results (from the past 240 hour(s))  Culture,  blood (routine x 2)     Status: None (Preliminary result)   Collection Time: 06/16/19  3:17 PM   Specimen: BLOOD  Result Value Ref Range Status   Specimen Description   Final    BLOOD BLOOD LEFT HAND Performed at [facility] [address], [place], [place] [address]    Special Requests   Final    BOTTLES DRAWN AEROBIC AND ANAEROBIC Blood Culture adequate volume Performed at [facility] [address] [place], [place] [address]    Culture   Final    NO GROWTH 3 DAYS Performed at [facility], [place] [address], [place], [place] [address]    Report Status PENDING  Incomplete  Culture, blood (routine x 2)     Status: None (Preliminary result)   Collection Time: 06/16/19  3:17 PM   Specimen: BLOOD  Result Value Ref Range Status   Specimen Description   Final    BLOOD BLOOD RIGHT HAND Performed at [facility] [address] [place], [place] [address]    Special Requests   Final    BOTTLES DRAWN AEROBIC AND ANAEROBIC Blood Culture adequate volume Performed at [facility] [address], [place], [place] [address]    Culture   Final    NO GROWTH 3 DAYS Performed at [facility], [place] [address] [place], [place] [address]    Report Status PENDING  Incomplete  SARS Coronavirus 2 by RT PCR (hospital order, performed in [facility] hospital lab) Nasopharyngeal Nasopharyngeal Swab     Status: None   Collection Time: 06/16/19  3:17 PM   Specimen: Nasopharyngeal Swab  Result Value Ref Range Status   SARS Coronavirus 2 NEGATIVE NEGATIVE Final    Comment: (NOTE) SARS-CoV-2 target nucleic acids are NOT DETECTED. The SARS-CoV-2 RNA is generally detectable in upper and lower respiratory specimens during the acute phase of infection. The lowest  concentration of SARS-CoV-2 viral copies this assay can detect is 250 copies / mL. A negative result does not preclude SARS-CoV-2 infection and should not  be used as the sole basis for treatment or other patient management decisions.  A negative result may occur with improper specimen collection / handling, submission of specimen other than nasopharyngeal swab, presence of viral mutation(s) within the areas targeted by this assay, and inadequate number of viral copies (<250 copies / mL). A negative result must be combined with clinical observations, patient history, and epidemiological information. Fact Sheet for Patients:   StrictlyIdeas.no Fact Sheet for Healthcare Providers: BankingDealers.co.za This test is not yet approved or cleared  by the [place] FDA and has been authorized for detection and/or diagnosis of SARS-CoV-2 by FDA under an Emergency Use Authorization (EUA).  This EUA will remain in effect (meaning this test can be used) for the duration of the COVID-19 declaration under Section 564(b)(1) of the Act, 21 U.S.C. section 360bbb-3(b)(1), unless the authorization is terminated or revoked sooner. Performed at [facility], [place] [address], [place], [place] [address]   MRSA PCR Screening     Status: None   Collection Time: 06/16/19  6:46 PM   Specimen: Nasopharyngeal  Result Value Ref Range Status   MRSA by PCR NEGATIVE NEGATIVE Final    Comment:        The GeneXpert MRSA Assay (FDA approved for NASAL specimens only), is one component of a comprehensive MRSA colonization surveillance program. It is not intended to diagnose MRSA infection nor to guide or monitor treatment for MRSA infections. Performed at [facility], [place] [address] [place], [place] [address]   Respiratory Panel by PCR     Status: None   Collection Time: 06/16/19  6:46 PM   Specimen: Respiratory  Result Value Ref Range Status   Adenovirus NOT DETECTED NOT DETECTED Final   Coronavirus 229E NOT DETECTED NOT DETECTED Final    Comment: (NOTE) The Coronavirus on  the Respiratory Panel, DOES NOT test for the novel  Coronavirus (2019 nCoV)    Coronavirus HKU1 NOT DETECTED NOT DETECTED Final   Coronavirus NL63 NOT DETECTED NOT DETECTED Final   Coronavirus OC43 NOT DETECTED NOT DETECTED Final   Metapneumovirus NOT DETECTED NOT DETECTED Final   Rhinovirus / Enterovirus NOT DETECTED NOT DETECTED Final   Influenza A NOT DETECTED NOT DETECTED Final   Influenza B NOT DETECTED NOT DETECTED Final   Parainfluenza Virus 1 NOT DETECTED NOT DETECTED Final   Parainfluenza Virus 2 NOT DETECTED NOT DETECTED Final   Parainfluenza Virus 3 NOT DETECTED NOT DETECTED Final   Parainfluenza Virus 4 NOT DETECTED NOT DETECTED Final   Respiratory Syncytial Virus NOT DETECTED NOT DETECTED Final   Bordetella pertussis NOT DETECTED NOT DETECTED Final   Chlamydophila pneumoniae NOT DETECTED NOT DETECTED Final   Mycoplasma pneumoniae NOT DETECTED NOT DETECTED Final    Comment: Performed at [facility], [place] [address], [place], [place] [address]  Expectorated sputum assessment w rflx to resp cult     Status: None   Collection Time: 06/17/19  9:52 AM   Specimen: Expectorated Sputum  Result Value Ref Range Status   Specimen Description EXPECTORATED SPUTUM  Final   Special Requests NONE  Final   Sputum evaluation   Final    Sputum specimen not acceptable for testing.  Please recollect.   NOTIFIED K,RICHARDSON @1108  ON 06/17/19 KJ Performed at [facility], [address]  [place], [place] [address]    Report Status 06/17/2019 FINAL  Final  Expectorated sputum assessment w rflx to resp cult     Status: None   Collection Time: 06/17/19  2:12 PM   Specimen: Sputum  Result Value Ref Range Status   Specimen Description SPU  Final   Special Requests NONE  Final   Sputum evaluation   Final    THIS SPECIMEN IS ACCEPTABLE FOR SPUTUM CULTURE Performed at [facility], [place] [address], [place], [place] [address]    Report Status 06/17/2019  FINAL  Final  Culture, respiratory     Status: None (Preliminary result)   Collection Time: 06/17/19  2:12 PM   Specimen: Sputum  Result Value Ref Range Status   Specimen Description   Final    SPU Performed at [facility] [address] [place], [place] [address]    Special Requests   Final    NONE Reflexed from [id number] Performed at [facility], [place] [address] [place], [place] [address]    Gram Stain   Final    FEW WBC PRESENT, PREDOMINANTLY PMN RARE GRAM POSITIVE COCCI    Culture   Final    CULTURE REINCUBATED FOR BETTER GROWTH Performed at [facility], [place] [address] [place], [place] [address]    Report Status PENDING  Incomplete    Procedures/Studies: DG Chest 2 View  Result Date: 06/16/2019 CLINICAL DATA:  Chest pain and shortness of breath. Lung cancer patient. EXAM: CHEST - 2 VIEW COMPARISON:  06/15/2019 FINDINGS: Worsening of widespread pulmonary density which could be worsening pneumonia, more likely than edema. No dense consolidation, collapse or effusion. IMPRESSION: Worsening bilateral pulmonary density worrisome for worsening pneumonia. Electronically Signed   By: Nelson Chimes M.D.   On: 06/16/2019 14:55   DG Chest 2 View  Result Date: 06/15/2019 CLINICAL DATA:  Chest pain EXAM: CHEST - 2 VIEW COMPARISON:  CT 04/10/2019 FINDINGS: Asymmetric elevation of the right hemidiaphragm with right basilar volume loss and architectural distortion likely related to the dense right perihilar scarring and masslike opacity as well as more medial lucent changes and distortion seen on comparison CT. Left perihilar opacity is possibly increased from comparison CT and radiography. Additional streaky opacities in the bases likely reflect atelectasis. No pneumothorax or visible effusion. No acute osseous or soft tissue abnormality. Exaggerated thoracic kyphosis. Degenerative changes are present in the imaged spine and shoulders. IMPRESSION: New  asymmetric elevation of the right hemidiaphragm with right basilar volume loss in a region of scarring and architectural distortion seen on comparison CT. Superimposed consolidation or infection this location is not excluded. Increasing left perihilar density is noted as well. Could reflect some consolidative process though developing lesion is not completely excluded. Could consider further evaluation with CT imaging. Electronically Signed   By: Lovena Le M.D.   On: 06/15/2019 22:18   CT Angio Chest PE W and/or Wo Contrast  Result Date: 06/16/2019 CLINICAL DATA:  Shortness of breath and chest pain on the right with abdominal distension and black tarry stools. EXAM: CT ANGIOGRAPHY CHEST CT ABDOMEN AND PELVIS WITH CONTRAST TECHNIQUE: Multidetector CT imaging of the chest was performed using the standard protocol during bolus administration of intravenous contrast. Multiplanar CT image reconstructions and MIPs were obtained to evaluate the vascular anatomy. Multidetector CT imaging of the abdomen and pelvis was performed using the standard protocol during bolus administration of intravenous contrast. CONTRAST:  174mL OMNIPAQUE IOHEXOL 350 MG/ML SOLN COMPARISON:  04/10/2019 FINDINGS: CTA CHEST FINDINGS Cardiovascular: Thoracic aorta demonstrates mild atherosclerotic calcifications without aneurysmal dilatation. No cardiac enlargement is seen. No sizable pericardial effusion is noted. The pulmonary artery shows a normal branching pattern. Singular filling defect is noted in the medial right lower lobe best seen on image number 121 of series 5. This is new from a recent CT of the chest from 04/10/2019. No other definitive pulmonary embolism is seen. Mediastinum/Nodes: Thoracic inlet is within normal limits. The esophagus as visualized is unremarkable. Diffuse fullness of the right hilar region is noted similar to that seen on the recent exam. No significant left-sided hilar adenopathy is noted. The esophagus as  visualized is within normal limits. Lungs/Pleura: Small right-sided pleural effusion is noted new from the prior exam. Area of masslike architectural distortion in the right perihilar region and right lower lobe is again identified. Some new patchy infiltrative changes noted in the right upper lobe increased when compared with the prior exam. Some similar subpleural infiltrate is noted in the left upper lobe anteriorly. Diffuse emphysematous changes are noted. Musculoskeletal: Degenerative changes of the thoracic spine are noted. No acute bony abnormality is noted. Review of the MIP images confirms the above findings. CT ABDOMEN and PELVIS FINDINGS Hepatobiliary: No focal liver abnormality is seen. No gallstones, gallbladder wall thickening, or biliary dilatation. Pancreas: Unremarkable. No pancreatic ductal dilatation or surrounding inflammatory changes. Spleen: Normal in size without focal abnormality. Adrenals/Urinary Tract: Stable thickening of the right adrenal gland is noted. 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 Kidneys demonstrate a normal enhancement pattern. No renal calculi or obstructive changes are noted. Delayed images demonstrate normal excretion of contrast material. The bladder is well distended. Stomach/Bowel: The appendix is within normal limits. No obstructive or inflammatory changes of the colon are seen. The stomach is decompressed. No definitive ulcer is seen within the stomach. Small bowel demonstrates some scattered fluid although no obstructive changes are noted. Vascular/Lymphatic: Aortic atherosclerosis. No enlarged abdominal or pelvic lymph nodes. Reproductive: Prostate is unremarkable. Other: No abdominal wall hernia or abnormality. No abdominopelvic ascites. Musculoskeletal: Mild degenerative changes of lumbar spine are seen. Review of the MIP images confirms the above findings. IMPRESSION: CTA of the chest: Singular pulmonary embolism in the right lower  lobe pulmonary artery medially new from the prior exam of 04/10/2019 Acute on chronic infiltrate scattered throughout the right and left lungs primarily within the upper lobes. Stable masslike architectural distortion in the right hilum and right lower lobe similar to that seen on the prior exam. New small right-sided pleural effusion. CT of the abdomen and pelvis: No acute abnormality is seen. Electronically Signed   By: Inez Catalina M.D.   On: 06/16/2019 17:07   CT ABDOMEN PELVIS W CONTRAST  Result Date: 06/16/2019 CLINICAL DATA:  Shortness of breath and chest pain on the right with abdominal distension and black tarry stools. EXAM: CT ANGIOGRAPHY CHEST CT ABDOMEN AND PELVIS WITH CONTRAST TECHNIQUE: Multidetector CT imaging of the chest was performed using the standard protocol during bolus administration of intravenous contrast. Multiplanar CT image reconstructions and MIPs were obtained to evaluate the vascular anatomy. Multidetector CT imaging of the abdomen and pelvis was performed using the standard protocol during bolus administration of intravenous contrast. CONTRAST:  151mL OMNIPAQUE IOHEXOL 350 MG/ML SOLN COMPARISON:  04/10/2019 FINDINGS: CTA CHEST FINDINGS Cardiovascular: Thoracic aorta demonstrates mild atherosclerotic calcifications without aneurysmal dilatation. No cardiac enlargement is seen. No sizable pericardial effusion is noted. The pulmonary artery shows a  normal branching pattern. Singular filling defect is noted in the medial right lower lobe best seen on image number 121 of series 5. This is new from a recent CT of the chest from 04/10/2019. No other definitive pulmonary embolism is seen. Mediastinum/Nodes: Thoracic inlet is within normal limits. The esophagus as visualized is unremarkable. Diffuse fullness of the right hilar region is noted similar to that seen on the recent exam. No significant left-sided hilar adenopathy is noted. The esophagus as visualized is within normal limits.  Lungs/Pleura: Small right-sided pleural effusion is noted new from the prior exam. Area of masslike architectural distortion in the right perihilar region and right lower lobe is again identified. Some new patchy infiltrative changes noted in the right upper lobe increased when compared with the prior exam. Some similar subpleural infiltrate is noted in the left upper lobe anteriorly. Diffuse emphysematous changes are noted. Musculoskeletal: Degenerative changes of the thoracic spine are noted. No acute bony abnormality is noted. Review of the MIP images confirms the above findings. CT ABDOMEN and PELVIS FINDINGS Hepatobiliary: No focal liver abnormality is seen. No gallstones, gallbladder wall thickening, or biliary dilatation. Pancreas: Unremarkable. No pancreatic ductal dilatation or surrounding inflammatory changes. Spleen: Normal in size without focal abnormality. Adrenals/Urinary Tract: Stable thickening of the right adrenal gland is noted. 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 Kidneys demonstrate a normal enhancement pattern. No renal calculi or obstructive changes are noted. Delayed images demonstrate normal excretion of contrast material. The bladder is well distended. Stomach/Bowel: The appendix is within normal limits. No obstructive or inflammatory changes of the colon are seen. The stomach is decompressed. No definitive ulcer is seen within the stomach. Small bowel demonstrates some scattered fluid although no obstructive changes are noted. Vascular/Lymphatic: Aortic atherosclerosis. No enlarged abdominal or pelvic lymph nodes. Reproductive: Prostate is unremarkable. Other: No abdominal wall hernia or abnormality. No abdominopelvic ascites. Musculoskeletal: Mild degenerative changes of lumbar spine are seen. Review of the MIP images confirms the above findings. IMPRESSION: CTA of the chest: Singular pulmonary embolism in the right lower lobe pulmonary artery medially new  from the prior exam of 04/10/2019 Acute on chronic infiltrate scattered throughout the right and left lungs primarily within the upper lobes. Stable masslike architectural distortion in the right hilum and right lower lobe similar to that seen on the prior exam. New small right-sided pleural effusion. CT of the abdomen and pelvis: No acute abnormality is seen. Electronically Signed   By: Inez Catalina M.D.   On: 06/16/2019 17:07   ECHOCARDIOGRAM COMPLETE  Result Date: 06/17/2019    ECHOCARDIOGRAM REPORT   Patient Name:   LAIDEN MILLES Date of Exam: 06/17/2019 Medical Rec #:  [id number]        Height:       66.0 in Accession #:    [id number]       Weight:       187.4 lb Date of Birth:  01-Mar-1955        BSA:          1.945 m Patient Age:    10 years         BP:           92/68 mmHg Patient Gender: M                HR:           95 bpm. Exam Location:  Inpatient Procedure: 2D Echo Indications:    Pulmonary Embolus  I26.99  History:        Patient has no prior history of Echocardiogram examinations.                 Risk Factors:Current Smoker and Dyslipidemia.  Sonographer:    Mikki Santee RDCS (AE) Referring Phys: [id number] [place]  1. Left ventricular ejection fraction, by estimation, is 60 to 65%. The left ventricle has normal function. The left ventricle has no regional wall motion abnormalities. Left ventricular diastolic parameters were normal.  2. Right ventricular systolic function is normal. The right ventricular size is normal.  3. The mitral valve is normal in structure. Trivial mitral valve regurgitation. No evidence of mitral stenosis.  4. The aortic valve is tricuspid. Aortic valve regurgitation is not visualized. Mild aortic valve sclerosis is present, with no evidence of aortic valve stenosis.  5. The inferior vena cava is normal in size with greater than 50% respiratory variability, suggesting right atrial pressure of 3 mmHg. FINDINGS  Left Ventricle: Left ventricular ejection  fraction, by estimation, is 60 to 65%. The left ventricle has normal function. The left ventricle has no regional wall motion abnormalities. The left ventricular internal cavity size was normal in size. There is  no left ventricular hypertrophy. Left ventricular diastolic parameters were normal. Right Ventricle: The right ventricular size is normal. No increase in right ventricular wall thickness. Right ventricular systolic function is normal. Left Atrium: Left atrial size was normal in size. Right Atrium: Right atrial size was normal in size. Pericardium: There is no evidence of pericardial effusion. The pericardial effusion is posterior to the left ventricle. Mitral Valve: The mitral valve is normal in structure. Normal mobility of the mitral valve leaflets. Trivial mitral valve regurgitation. No evidence of mitral valve stenosis. Tricuspid Valve: The tricuspid valve is normal in structure. Tricuspid valve regurgitation is mild . No evidence of tricuspid stenosis. Aortic Valve: The aortic valve is tricuspid. Aortic valve regurgitation is not visualized. Mild aortic valve sclerosis is present, with no evidence of aortic valve stenosis. Pulmonic Valve: The pulmonic valve was normal in structure. Pulmonic valve regurgitation is not visualized. No evidence of pulmonic stenosis. Aorta: The aortic root is normal in size and structure. Venous: The inferior vena cava is normal in size with greater than 50% respiratory variability, suggesting right atrial pressure of 3 mmHg. IAS/Shunts: The interatrial septum was not well visualized.  LEFT VENTRICLE PLAX 2D LVIDd:         5.10 cm  Diastology LVIDs:         3.40 cm  LV e' lateral:   12.30 cm/s LV PW:         1.00 cm  LV E/e' lateral: 3.7 LV IVS:        1.00 cm  LV e' medial:    7.62 cm/s LVOT diam:     2.20 cm  LV E/e' medial:  5.9 LV SV:         68 LV SV Index:   35 LVOT Area:     3.80 cm  RIGHT VENTRICLE RV S prime:     11.50 cm/s TAPSE (M-mode): 1.4 cm LEFT ATRIUM              Index       RIGHT ATRIUM           Index LA diam:        3.20 cm 1.65 cm/m  RA Area:     11.70 cm LA Vol (A2C):  18.7 ml 9.61 ml/m  RA Volume:   17.10 ml  8.79 ml/m LA Vol (A4C):   36.5 ml 18.76 ml/m LA Biplane Vol: 27.9 ml 14.34 ml/m  AORTIC VALVE LVOT Vmax:   92.40 cm/s LVOT Vmean:  60.400 cm/s LVOT VTI:    0.180 m  AORTA Ao Root diam: 3.60 cm MITRAL VALVE MV Area (PHT): 2.71 cm    SHUNTS MV Decel Time: 280 msec    Systemic VTI:  0.18 m MV E velocity: 44.90 cm/s  Systemic Diam: 2.20 cm MV A velocity: 54.80 cm/s MV E/A ratio:  0.82 Jenkins Rouge MD Electronically signed by Jenkins Rouge MD Signature Date/Time: 06/17/2019/10:38:16 AM    Final    VAS [place] LOWER EXTREMITY VENOUS (DVT)  Result Date: 06/18/2019  Lower Venous DVTStudy Indications: Pulmonary embolism.  Risk Factors: Lung cancer. Comparison Study: none Performing Technologist: June Leap RDMS, RVT  Examination Guidelines: A complete evaluation includes B-mode imaging, spectral Doppler, color Doppler, and power Doppler as needed of all accessible portions of each vessel. Bilateral testing is considered an integral part of a complete examination. Limited examinations for reoccurring indications may be performed as noted. The reflux portion of the exam is performed with the patient in reverse Trendelenburg.  +---------+---------------+---------+-----------+----------+--------------+ RIGHT    CompressibilityPhasicitySpontaneityPropertiesThrombus Aging +---------+---------------+---------+-----------+----------+--------------+ CFV      Full           Yes      Yes                                 +---------+---------------+---------+-----------+----------+--------------+ SFJ      Full                                                        +---------+---------------+---------+-----------+----------+--------------+ FV Prox  Full                                                         +---------+---------------+---------+-----------+----------+--------------+ FV Mid   Full                                                        +---------+---------------+---------+-----------+----------+--------------+ FV DistalFull                                                        +---------+---------------+---------+-----------+----------+--------------+ PFV      Full                                                        +---------+---------------+---------+-----------+----------+--------------+ POP      Full  Yes      Yes                                 +---------+---------------+---------+-----------+----------+--------------+ PTV      Full                                                        +---------+---------------+---------+-----------+----------+--------------+ PERO     Full                                                        +---------+---------------+---------+-----------+----------+--------------+   +---------+---------------+---------+-----------+----------+--------------+ LEFT     CompressibilityPhasicitySpontaneityPropertiesThrombus Aging +---------+---------------+---------+-----------+----------+--------------+ CFV      Full           Yes      Yes                                 +---------+---------------+---------+-----------+----------+--------------+ SFJ      Full                                                        +---------+---------------+---------+-----------+----------+--------------+ FV Prox  Full                                                        +---------+---------------+---------+-----------+----------+--------------+ FV Mid   Full                                                        +---------+---------------+---------+-----------+----------+--------------+ FV DistalFull                                                         +---------+---------------+---------+-----------+----------+--------------+ PFV      Full                                                        +---------+---------------+---------+-----------+----------+--------------+ POP      Full           Yes      Yes                                 +---------+---------------+---------+-----------+----------+--------------+ PTV  Full                                                        +---------+---------------+---------+-----------+----------+--------------+ PERO     Full                                                        +---------+---------------+---------+-----------+----------+--------------+     Summary: BILATERAL: - No evidence of deep vein thrombosis seen in the lower extremities, bilaterally. -No evidence of popliteal cyst, bilaterally.   *See table(s) above for measurements and observations. Electronically signed by Ruta Hinds MD on 06/18/2019 at 8:52:01 AM.    Final     Time coordinating discharge: Over 30 minutes  SIGNED:   Guilford Shi, MD  [facility] 06/19/2019, 2:52 PM

## 2019-06-19 NOTE — Progress Notes (Signed)
HEMATOLOGY-ONCOLOGY PROGRESS NOTE  SUBJECTIVE: Philip [name] presented to the emergency room with right-sided chest pain, shortness of breath, and productive cough.  A CT angiogram of the chest showed a singular pulmonary embolism in the right lower lobe and bilateral pneumonia.  He was started on heparin and transition to apixaban on 06/18/2019.  Remains on cefepime for his pneumonia.  Reports ongoing right-sided chest pain and some shortness of breath.  Still receiving IV pain medications.  He remains on oxygen.  Oncology History  Adenocarcinoma of right lung, stage 3 ([place])  01/13/2019 Initial Diagnosis   Adenocarcinoma of right lung, stage 3 ([place])   01/30/2019 - 03/12/2019 Chemotherapy   The patient had palonosetron (ALOXI) injection 0.25 mg, 0.25 mg, Intravenous,  Once, 6 of 7 cycles Administration: 0.25 mg (01/30/2019), 0.25 mg (02/06/2019), 0.25 mg (02/27/2019), 0.25 mg (03/06/2019), 0.25 mg (02/13/2019), 0.25 mg (02/20/2019) CARBOplatin (PARAPLATIN) 210 mg in sodium chloride 0.9 % 250 mL chemo infusion, 210 mg (100 % of original dose 209.2 mg), Intravenous,  Once, 6 of 7 cycles Dose modification: 209.2 mg (original dose 209.2 mg, Cycle 1), 209.2 mg (original dose 209.2 mg, Cycle 5), 242.8 mg (original dose 209.2 mg, Cycle 5, Reason: Change in SCr/CrCl) Administration: 210 mg (01/30/2019), 210 mg (02/06/2019), 210 mg (02/27/2019), 210 mg (03/06/2019), 210 mg (02/13/2019), 210 mg (02/20/2019) PACLitaxel (TAXOL) 90 mg in sodium chloride 0.9 % 250 mL chemo infusion (</= 80mg /m2), 45 mg/m2 = 90 mg, Intravenous,  Once, 6 of 7 cycles Administration: 90 mg (01/30/2019), 90 mg (02/06/2019), 90 mg (02/27/2019), 90 mg (03/06/2019), 90 mg (02/13/2019), 90 mg (02/20/2019)  for chemotherapy treatment.    04/19/2019 -  Chemotherapy   The patient had durvalumab (IMFINZI) 1,500 mg in sodium chloride 0.9 % 100 mL chemo infusion, 1,500 mg, Intravenous,  Once, 3 of 12 cycles Administration: 1,500 mg (04/19/2019), 1,500 mg (06/14/2019),  1,500 mg (05/17/2019)  for chemotherapy treatment.       REVIEW OF SYSTEMS:   Constitutional: Denies fevers, chills  Respiratory: Has ongoing shortness of breath and cough Cardiovascular: Reports right-sided chest pain Gastrointestinal:  Denies nausea, heartburn or change in bowel habits Skin: Denies abnormal skin rashes Lymphatics: Denies new lymphadenopathy or easy bruising Neurological:Denies numbness, tingling or new weaknesses Behavioral/Psych: Mood is stable, no new changes  Extremities: No lower extremity edema All other systems were reviewed with the patient and are negative.  I have reviewed the past medical history, past surgical history, social history and family history with the patient and they are unchanged from previous note.   PHYSICAL EXAMINATION: ECOG PERFORMANCE STATUS: 1 - Symptomatic but completely ambulatory  Vitals:   06/19/19 0530 06/19/19 0829  BP: 105/73   Pulse: 81   Resp: 18   Temp: 98.5 F (36.9 C)   SpO2: 95% 96%   Filed Weights   06/16/19 1410  Weight: 85 kg    Intake/Output from previous day: 05/16 0701 - 05/17 0700 In: 660 [P.O.:120; I.V.:240; IV Piggyback:300] Out: -   GENERAL:alert, no distress and comfortable LUNGS: Diminished right base HEART: regular rate & rhythm and no murmurs and no lower extremity edema ABDOMEN:abdomen soft, non-tender and normal bowel sounds Musculoskeletal:no cyanosis of digits and no clubbing  NEURO: alert & oriented x 3 with fluent speech, no focal motor/sensory deficits  LABORATORY DATA:  I have reviewed the data as listed CMP Latest Ref Rng & Units 06/19/2019 06/18/2019 06/16/2019  Glucose 70 - 99 mg/dL 104(H) 106(H) 125(H)  BUN 8 - 23 mg/dL 12 14  17  Creatinine 0.61 - 1.24 mg/dL 0.76 0.84 0.87  Sodium 135 - 145 mmol/L 135 135 133(L)  Potassium 3.5 - 5.1 mmol/L 4.0 3.9 4.2  Chloride 98 - 111 mmol/L 100 102 98  CO2 22 - 32 mmol/L 28 27 25   Calcium 8.9 - 10.3 mg/dL 8.6(L) 8.4(L) 8.9  Total Protein  6.5 - 8.1 g/dL - 6.1(L) 8.0  Total Bilirubin 0.3 - 1.2 mg/dL - 0.5 0.6  Alkaline Phos 38 - 126 U/L - 82 109  AST 15 - 41 U/L - 17 18  ALT 0 - 44 U/L - 23 24    Lab Results  Component Value Date   WBC 6.3 06/19/2019   HGB 13.3 06/19/2019   HCT 41.2 06/19/2019   MCV 94.9 06/19/2019   PLT 244 06/19/2019   NEUTROABS 7.4 06/14/2019    DG Chest 2 View  Result Date: 06/16/2019 CLINICAL DATA:  Chest pain and shortness of breath. Lung cancer patient. EXAM: CHEST - 2 VIEW COMPARISON:  06/15/2019 FINDINGS: Worsening of widespread pulmonary density which could be worsening pneumonia, more likely than edema. No dense consolidation, collapse or effusion. IMPRESSION: Worsening bilateral pulmonary density worrisome for worsening pneumonia. Electronically Signed   By: Nelson Chimes M.D.   On: 06/16/2019 14:55   DG Chest 2 View  Result Date: 06/15/2019 CLINICAL DATA:  Chest pain EXAM: CHEST - 2 VIEW COMPARISON:  CT 04/10/2019 FINDINGS: Asymmetric elevation of the right hemidiaphragm with right basilar volume loss and architectural distortion likely related to the dense right perihilar scarring and masslike opacity as well as more medial lucent changes and distortion seen on comparison CT. Left perihilar opacity is possibly increased from comparison CT and radiography. Additional streaky opacities in the bases likely reflect atelectasis. No pneumothorax or visible effusion. No acute osseous or soft tissue abnormality. Exaggerated thoracic kyphosis. Degenerative changes are present in the imaged spine and shoulders. IMPRESSION: New asymmetric elevation of the right hemidiaphragm with right basilar volume loss in a region of scarring and architectural distortion seen on comparison CT. Superimposed consolidation or infection this location is not excluded. Increasing left perihilar density is noted as well. Could reflect some consolidative process though developing lesion is not completely excluded. Could consider  further evaluation with CT imaging. Electronically Signed   By: Lovena Le M.D.   On: 06/15/2019 22:18   CT Angio Chest PE W and/or Wo Contrast  Result Date: 06/16/2019 CLINICAL DATA:  Shortness of breath and chest pain on the right with abdominal distension and black tarry stools. EXAM: CT ANGIOGRAPHY CHEST CT ABDOMEN AND PELVIS WITH CONTRAST TECHNIQUE: Multidetector CT imaging of the chest was performed using the standard protocol during bolus administration of intravenous contrast. Multiplanar CT image reconstructions and MIPs were obtained to evaluate the vascular anatomy. Multidetector CT imaging of the abdomen and pelvis was performed using the standard protocol during bolus administration of intravenous contrast. CONTRAST:  177mL OMNIPAQUE IOHEXOL 350 MG/ML SOLN COMPARISON:  04/10/2019 FINDINGS: CTA CHEST FINDINGS Cardiovascular: Thoracic aorta demonstrates mild atherosclerotic calcifications without aneurysmal dilatation. No cardiac enlargement is seen. No sizable pericardial effusion is noted. The pulmonary artery shows a normal branching pattern. Singular filling defect is noted in the medial right lower lobe best seen on image number 121 of series 5. This is new from a recent CT of the chest from 04/10/2019. No other definitive pulmonary embolism is seen. Mediastinum/Nodes: Thoracic inlet is within normal limits. The esophagus as visualized is unremarkable. Diffuse fullness of the right hilar region  is noted similar to that seen on the recent exam. No significant left-sided hilar adenopathy is noted. The esophagus as visualized is within normal limits. Lungs/Pleura: Small right-sided pleural effusion is noted new from the prior exam. Area of masslike architectural distortion in the right perihilar region and right lower lobe is again identified. Some new patchy infiltrative changes noted in the right upper lobe increased when compared with the prior exam. Some similar subpleural infiltrate is noted  in the left upper lobe anteriorly. Diffuse emphysematous changes are noted. Musculoskeletal: Degenerative changes of the thoracic spine are noted. No acute bony abnormality is noted. Review of the MIP images confirms the above findings. CT ABDOMEN and PELVIS FINDINGS Hepatobiliary: No focal liver abnormality is seen. No gallstones, gallbladder wall thickening, or biliary dilatation. Pancreas: Unremarkable. No pancreatic ductal dilatation or surrounding inflammatory changes. Spleen: Normal in size without focal abnormality. Adrenals/Urinary Tract: Stable thickening of the right adrenal gland is noted. 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 Kidneys demonstrate a normal enhancement pattern. No renal calculi or obstructive changes are noted. Delayed images demonstrate normal excretion of contrast material. The bladder is well distended. Stomach/Bowel: The appendix is within normal limits. No obstructive or inflammatory changes of the colon are seen. The stomach is decompressed. No definitive ulcer is seen within the stomach. Small bowel demonstrates some scattered fluid although no obstructive changes are noted. Vascular/Lymphatic: Aortic atherosclerosis. No enlarged abdominal or pelvic lymph nodes. Reproductive: Prostate is unremarkable. Other: No abdominal wall hernia or abnormality. No abdominopelvic ascites. Musculoskeletal: Mild degenerative changes of lumbar spine are seen. Review of the MIP images confirms the above findings. IMPRESSION: CTA of the chest: Singular pulmonary embolism in the right lower lobe pulmonary artery medially new from the prior exam of 04/10/2019 Acute on chronic infiltrate scattered throughout the right and left lungs primarily within the upper lobes. Stable masslike architectural distortion in the right hilum and right lower lobe similar to that seen on the prior exam. New small right-sided pleural effusion. CT of the abdomen and pelvis: No acute abnormality  is seen. Electronically Signed   By: Inez Catalina M.D.   On: 06/16/2019 17:07   CT ABDOMEN PELVIS W CONTRAST  Result Date: 06/16/2019 CLINICAL DATA:  Shortness of breath and chest pain on the right with abdominal distension and black tarry stools. EXAM: CT ANGIOGRAPHY CHEST CT ABDOMEN AND PELVIS WITH CONTRAST TECHNIQUE: Multidetector CT imaging of the chest was performed using the standard protocol during bolus administration of intravenous contrast. Multiplanar CT image reconstructions and MIPs were obtained to evaluate the vascular anatomy. Multidetector CT imaging of the abdomen and pelvis was performed using the standard protocol during bolus administration of intravenous contrast. CONTRAST:  166mL OMNIPAQUE IOHEXOL 350 MG/ML SOLN COMPARISON:  04/10/2019 FINDINGS: CTA CHEST FINDINGS Cardiovascular: Thoracic aorta demonstrates mild atherosclerotic calcifications without aneurysmal dilatation. No cardiac enlargement is seen. No sizable pericardial effusion is noted. The pulmonary artery shows a normal branching pattern. Singular filling defect is noted in the medial right lower lobe best seen on image number 121 of series 5. This is new from a recent CT of the chest from 04/10/2019. No other definitive pulmonary embolism is seen. Mediastinum/Nodes: Thoracic inlet is within normal limits. The esophagus as visualized is unremarkable. Diffuse fullness of the right hilar region is noted similar to that seen on the recent exam. No significant left-sided hilar adenopathy is noted. The esophagus as visualized is within normal limits. Lungs/Pleura: Small right-sided pleural effusion is  noted new from the prior exam. Area of masslike architectural distortion in the right perihilar region and right lower lobe is again identified. Some new patchy infiltrative changes noted in the right upper lobe increased when compared with the prior exam. Some similar subpleural infiltrate is noted in the left upper lobe anteriorly.  Diffuse emphysematous changes are noted. Musculoskeletal: Degenerative changes of the thoracic spine are noted. No acute bony abnormality is noted. Review of the MIP images confirms the above findings. CT ABDOMEN and PELVIS FINDINGS Hepatobiliary: No focal liver abnormality is seen. No gallstones, gallbladder wall thickening, or biliary dilatation. Pancreas: Unremarkable. No pancreatic ductal dilatation or surrounding inflammatory changes. Spleen: Normal in size without focal abnormality. Adrenals/Urinary Tract: Stable thickening of the right adrenal gland is noted. 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 Kidneys demonstrate a normal enhancement pattern. No renal calculi or obstructive changes are noted. Delayed images demonstrate normal excretion of contrast material. The bladder is well distended. Stomach/Bowel: The appendix is within normal limits. No obstructive or inflammatory changes of the colon are seen. The stomach is decompressed. No definitive ulcer is seen within the stomach. Small bowel demonstrates some scattered fluid although no obstructive changes are noted. Vascular/Lymphatic: Aortic atherosclerosis. No enlarged abdominal or pelvic lymph nodes. Reproductive: Prostate is unremarkable. Other: No abdominal wall hernia or abnormality. No abdominopelvic ascites. Musculoskeletal: Mild degenerative changes of lumbar spine are seen. Review of the MIP images confirms the above findings. IMPRESSION: CTA of the chest: Singular pulmonary embolism in the right lower lobe pulmonary artery medially new from the prior exam of 04/10/2019 Acute on chronic infiltrate scattered throughout the right and left lungs primarily within the upper lobes. Stable masslike architectural distortion in the right hilum and right lower lobe similar to that seen on the prior exam. New small right-sided pleural effusion. CT of the abdomen and pelvis: No acute abnormality is seen. Electronically Signed    By: Inez Catalina M.D.   On: 06/16/2019 17:07   ECHOCARDIOGRAM COMPLETE  Result Date: 06/17/2019    ECHOCARDIOGRAM REPORT   Patient Name:   Philip [name] Date of Exam: 06/17/2019 Medical Rec #:  [id number]        Height:       66.0 in Accession #:    [id number]       Weight:       187.4 lb Date of Birth:  02/28/1955        BSA:          1.945 m Patient Age:    64 years         BP:           92/68 mmHg Patient Gender: M                HR:           95 bpm. Exam Location:  Inpatient Procedure: 2D Echo Indications:    Pulmonary Embolus I26.99  History:        Patient has no prior history of Echocardiogram examinations.                 Risk Factors:Current Smoker and Dyslipidemia.  Sonographer:    Mikki Santee RDCS (AE) Referring Phys: [id number] [place]  1. Left ventricular ejection fraction, by estimation, is 60 to 65%. The left ventricle has normal function. The left ventricle has no regional wall motion abnormalities. Left ventricular diastolic parameters were normal.  2. Right ventricular systolic function  is normal. The right ventricular size is normal.  3. The mitral valve is normal in structure. Trivial mitral valve regurgitation. No evidence of mitral stenosis.  4. The aortic valve is tricuspid. Aortic valve regurgitation is not visualized. Mild aortic valve sclerosis is present, with no evidence of aortic valve stenosis.  5. The inferior vena cava is normal in size with greater than 50% respiratory variability, suggesting right atrial pressure of 3 mmHg. FINDINGS  Left Ventricle: Left ventricular ejection fraction, by estimation, is 60 to 65%. The left ventricle has normal function. The left ventricle has no regional wall motion abnormalities. The left ventricular internal cavity size was normal in size. There is  no left ventricular hypertrophy. Left ventricular diastolic parameters were normal. Right Ventricle: The right ventricular size is normal. No increase in right ventricular wall  thickness. Right ventricular systolic function is normal. Left Atrium: Left atrial size was normal in size. Right Atrium: Right atrial size was normal in size. Pericardium: There is no evidence of pericardial effusion. The pericardial effusion is posterior to the left ventricle. Mitral Valve: The mitral valve is normal in structure. Normal mobility of the mitral valve leaflets. Trivial mitral valve regurgitation. No evidence of mitral valve stenosis. Tricuspid Valve: The tricuspid valve is normal in structure. Tricuspid valve regurgitation is mild . No evidence of tricuspid stenosis. Aortic Valve: The aortic valve is tricuspid. Aortic valve regurgitation is not visualized. Mild aortic valve sclerosis is present, with no evidence of aortic valve stenosis. Pulmonic Valve: The pulmonic valve was normal in structure. Pulmonic valve regurgitation is not visualized. No evidence of pulmonic stenosis. Aorta: The aortic root is normal in size and structure. Venous: The inferior vena cava is normal in size with greater than 50% respiratory variability, suggesting right atrial pressure of 3 mmHg. IAS/Shunts: The interatrial septum was not well visualized.  LEFT VENTRICLE PLAX 2D LVIDd:         5.10 cm  Diastology LVIDs:         3.40 cm  LV e' lateral:   12.30 cm/s LV PW:         1.00 cm  LV E/e' lateral: 3.7 LV IVS:        1.00 cm  LV e' medial:    7.62 cm/s LVOT diam:     2.20 cm  LV E/e' medial:  5.9 LV SV:         68 LV SV Index:   35 LVOT Area:     3.80 cm  RIGHT VENTRICLE RV S prime:     11.50 cm/s TAPSE (M-mode): 1.4 cm LEFT ATRIUM             Index       RIGHT ATRIUM           Index LA diam:        3.20 cm 1.65 cm/m  RA Area:     11.70 cm LA Vol (A2C):   18.7 ml 9.61 ml/m  RA Volume:   17.10 ml  8.79 ml/m LA Vol (A4C):   36.5 ml 18.76 ml/m LA Biplane Vol: 27.9 ml 14.34 ml/m  AORTIC VALVE LVOT Vmax:   92.40 cm/s LVOT Vmean:  60.400 cm/s LVOT VTI:    0.180 m  AORTA Ao Root diam: 3.60 cm MITRAL VALVE MV Area (PHT):  2.71 cm    SHUNTS MV Decel Time: 280 msec    Systemic VTI:  0.18 m MV E velocity: 44.90 cm/s  Systemic Diam: 2.20 cm MV  A velocity: 54.80 cm/s MV E/A ratio:  0.82 Jenkins Rouge MD Electronically signed by Jenkins Rouge MD Signature Date/Time: 06/17/2019/10:38:16 AM    Final    VAS [place] LOWER EXTREMITY VENOUS (DVT)  Result Date: 06/18/2019  Lower Venous DVTStudy Indications: Pulmonary embolism.  Risk Factors: Lung cancer. Comparison Study: none Performing Technologist: June Leap RDMS, RVT  Examination Guidelines: A complete evaluation includes B-mode imaging, spectral Doppler, color Doppler, and power Doppler as needed of all accessible portions of each vessel. Bilateral testing is considered an integral part of a complete examination. Limited examinations for reoccurring indications may be performed as noted. The reflux portion of the exam is performed with the patient in reverse Trendelenburg.  +---------+---------------+---------+-----------+----------+--------------+ RIGHT    CompressibilityPhasicitySpontaneityPropertiesThrombus Aging +---------+---------------+---------+-----------+----------+--------------+ CFV      Full           Yes      Yes                                 +---------+---------------+---------+-----------+----------+--------------+ SFJ      Full                                                        +---------+---------------+---------+-----------+----------+--------------+ FV Prox  Full                                                        +---------+---------------+---------+-----------+----------+--------------+ FV Mid   Full                                                        +---------+---------------+---------+-----------+----------+--------------+ FV DistalFull                                                        +---------+---------------+---------+-----------+----------+--------------+ PFV      Full                                                         +---------+---------------+---------+-----------+----------+--------------+ POP      Full           Yes      Yes                                 +---------+---------------+---------+-----------+----------+--------------+ PTV      Full                                                        +---------+---------------+---------+-----------+----------+--------------+  PERO     Full                                                        +---------+---------------+---------+-----------+----------+--------------+   +---------+---------------+---------+-----------+----------+--------------+ LEFT     CompressibilityPhasicitySpontaneityPropertiesThrombus Aging +---------+---------------+---------+-----------+----------+--------------+ CFV      Full           Yes      Yes                                 +---------+---------------+---------+-----------+----------+--------------+ SFJ      Full                                                        +---------+---------------+---------+-----------+----------+--------------+ FV Prox  Full                                                        +---------+---------------+---------+-----------+----------+--------------+ FV Mid   Full                                                        +---------+---------------+---------+-----------+----------+--------------+ FV DistalFull                                                        +---------+---------------+---------+-----------+----------+--------------+ PFV      Full                                                        +---------+---------------+---------+-----------+----------+--------------+ POP      Full           Yes      Yes                                 +---------+---------------+---------+-----------+----------+--------------+ PTV      Full                                                         +---------+---------------+---------+-----------+----------+--------------+ PERO     Full                                                        +---------+---------------+---------+-----------+----------+--------------+  Summary: BILATERAL: - No evidence of deep vein thrombosis seen in the lower extremities, bilaterally. -No evidence of popliteal cyst, bilaterally.   *See table(s) above for measurements and observations. Electronically signed by Ruta Hinds MD on 06/18/2019 at 8:52:01 AM.    Final     ASSESSMENT AND PLAN: This is a very pleasant 64 year old white male with stage IIIc non-small cell lung cancer, adenocarcinoma diagnosed in December 2020.  He completed a course of concurrent chemoradiation with weekly carboplatin and paclitaxel status post 6 cycles and tolerated treatment well with a partial response.  He is now receiving consolidation treatment with immunotherapy with Imfinzi 1500 mg IV every 4 weeks status post 3 cycles.  Last cycle was given on 06/14/2019.  He is now admitted with a new PE and pneumonia.  He initially received heparin and has now been transitioned to apixaban.  Recommend continuation of apixaban which he will continue as an outpatient.  Continue treatment for his pneumonia with IV antibiotics and transition to oral antibiotics per hospitalist.  He remains on IV pain medication.  We discussed trying to transition to oral pain medications.  He has an allergy to Norco noted on his chart which he states just made him very jittery.  He has tolerated oxycodone well in the past.  Therefore, I have ordered oxycodone/APAP 5/325 1 tablet every 6 hours as needed for moderate pain.  He already has an appointment with Dr. Julien Nordmann on 07/12/2019 and he will keep this appointment.   LOS: 3 days   Mikey Bussing, DNP, AGPCNP-BC, AOCNP 06/19/19

## 2019-06-19 NOTE — TOC Progression Note (Signed)
Transition of Care [facility]) - Progression Note    Patient Details  Name: Philip [name] MRN: [id number] Date of Birth: [birth year]  [facility] CM/SW Contact  Purcell Mouton, RN Phone Number: 06/19/2019, 3:20 PM  Clinical Narrative:    Waiting for O2 sats for home O2 qualification.         Expected Discharge Plan and Services           Expected Discharge Date: 06/19/19                                     Social Determinants of Health (SDOH) Interventions    Readmission Risk Interventions No flowsheet data found.

## 2019-06-19 NOTE — Progress Notes (Signed)
SATURATION QUALIFICATIONS: (This note is used to comply with regulatory documentation for home oxygen)  Patient Saturations on Room Air at Rest 92%  Patient Saturations on Room Air while Ambulating =84%  Patient Saturations on 2 Liters of oxygen while Ambulating = 92%  Please briefly explain why patient needs home oxygen: to maintain O2 sats of 92 and above

## 2019-06-20 ENCOUNTER — Telehealth: Payer: Self-pay | Admitting: Medical Oncology

## 2019-06-20 ENCOUNTER — Telehealth: Payer: Self-pay | Admitting: Critical Care Medicine

## 2019-06-20 DIAGNOSIS — J449 Chronic obstructive pulmonary disease, unspecified: Secondary | ICD-10-CM

## 2019-06-20 LAB — CULTURE, RESPIRATORY W GRAM STAIN: Culture: NORMAL

## 2019-06-20 NOTE — Telephone Encounter (Signed)
Ashly from [facility] states they can deliver nebulizer for patient.  Needs signed order.  Please advise.  [phone]

## 2019-06-20 NOTE — Telephone Encounter (Signed)
I told pharmacy to call Dr Noemi Chapel for nebulizer.

## 2019-06-20 NOTE — Telephone Encounter (Signed)
I called and spoke with [facility]  Pt has albuterol neb sol prescribed by the hospitalist  Gu[place]ford Shi, MD He was admitted to hospital 5/14-5/17/2021 with PNA  [facility] not have any [facility] machines and we need to send order somewhere else if you want pt to use nebs  [facility] can deliver the machine  Dr Carlis Abbott, please advise if you are okay with [place] sending order for the neb machine, thanks

## 2019-06-21 LAB — CULTURE, BLOOD (ROUTINE X 2)
Culture: NO GROWTH
Culture: NO GROWTH
Special Requests: ADEQUATE
Special Requests: ADEQUATE

## 2019-06-22 ENCOUNTER — Telehealth: Payer: Self-pay | Admitting: Medical Oncology

## 2019-06-22 NOTE — Telephone Encounter (Signed)
Pt is requesting order for nebulizer to go to [facility] in [place]. Community education officer at [employer] said they have nebulizer there.  Please advise. [phone] (patient)

## 2019-06-22 NOTE — Telephone Encounter (Signed)
Advised pt that we are still waiting on Dr. Ainsley Spinner response. After looking at [facility], Dr. Carlis Abbott is out of the office.  Aaron Edelman - can we send in a neb machine for pt under your name?

## 2019-06-22 NOTE — Telephone Encounter (Signed)
Number for [facility] in [place] is [phone].  Pt has medicine for machine.

## 2019-06-22 NOTE — Telephone Encounter (Signed)
Sure   Philip [name]

## 2019-06-22 NOTE — Telephone Encounter (Signed)
Nebulizer machine- pt received medication for nebulizer prescribed by hospitalist ,but not nebulizer machine. I told pt to contact Dr Carlis Abbott for nebulizer rx.

## 2019-06-22 NOTE — Telephone Encounter (Signed)
Spoke with pt. He is aware of this information. Order has been placed. Nothing further was needed.

## 2019-06-26 ENCOUNTER — Telehealth: Payer: Self-pay | Admitting: Critical Care Medicine

## 2019-06-26 DIAGNOSIS — R0600 Dyspnea, unspecified: Secondary | ICD-10-CM

## 2019-06-26 DIAGNOSIS — R0609 Other forms of dyspnea: Secondary | ICD-10-CM

## 2019-06-26 NOTE — Telephone Encounter (Signed)
Spoke with [facility] regarding patient's prior message regarding Nebulizer. [facility] stated they are short staffed and she will look in the referrals when she gets a chance and she will call him back. Advised patient what [facility] said and his voice was understanding. Nothing else further needed.

## 2019-06-26 NOTE — Telephone Encounter (Signed)
[facility] closed. Will call in the AM when they are open.

## 2019-06-26 NOTE — Telephone Encounter (Signed)
Can we please prescribe this gentleman a nebulizer? Thank you!  LPC

## 2019-06-27 ENCOUNTER — Other Ambulatory Visit: Payer: Self-pay

## 2019-06-27 ENCOUNTER — Telehealth: Payer: Self-pay | Admitting: Pulmonary Disease

## 2019-06-27 DIAGNOSIS — J449 Chronic obstructive pulmonary disease, unspecified: Secondary | ICD-10-CM

## 2019-06-27 MED ORDER — ALBUTEROL SULFATE HFA 108 (90 BASE) MCG/ACT IN AERS: 2.0000 | INHALATION_SPRAY | RESPIRATORY_TRACT | 6 refills | Status: DC | PRN

## 2019-06-27 NOTE — Telephone Encounter (Signed)
Philip [name], please see message from [name] in regards to [place]. Pt does have an upcoming appt next week on 6/3.

## 2019-06-27 NOTE — Telephone Encounter (Signed)
Thanks!  LCP

## 2019-06-27 NOTE — Telephone Encounter (Signed)
Pt got his nebulizer machine at [facility].

## 2019-06-27 NOTE — Telephone Encounter (Signed)
I have never seen this patient before.  I see that he is scheduled on 07/06/2019.  We simply were just trying to help him out given the fact that at discharge from the hospital they had recommended a nebulizer.  [facility] can see if they can utilize that documentation.  I really wish that [facility] would have explained and stated this information prior to [place] prescribing this for the patient so we could have already coordinated the follow-up.Wyn Quaker, FNP

## 2019-06-27 NOTE — Telephone Encounter (Signed)
I have talked to Aaron Edelman and Rodena Piety it regards to the nebulizer situation. We will have to wait to do anything for this patient until his appointment on 6/3 with Aaron Edelman. 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  It does not mention anything about a nebulizer machine just medications so because of this we will have to wait till his OV. All parties are aware.

## 2019-06-27 NOTE — Telephone Encounter (Signed)
Dr. Carlis Abbott according to Rodena Piety and Estill Bamberg from [place]:  [place]   AW  9:24 AM Note   I spoke with Estill Bamberg from [place] this morning and she states that the patient will need OV or televisit talking about the [employer] and usage.  The notes we have are before the neb was ordered     ATC patient to follow up of he has received machine yet or not. He has an appointment scheduled with Wyn Quaker on 6/3 as of right now. Will wait to hear back from patient.

## 2019-06-27 NOTE — Telephone Encounter (Signed)
I spoke with Estill Bamberg from [place] this morning and she states that the patient will need OV or televisit talking about the [employer] and usage.  The notes we have are before the neb was ordered

## 2019-06-27 NOTE — Telephone Encounter (Signed)
Called and spoke with patient. He states that order for nebulizer was sent in last week. I let him know that it was sent in but that [facility] wanted him to have an appointment with our office and our OV notes have to say that he needs nebulizer machine. Looks like this was supposed to be from hospital admission. He has an appointment with Aaron Edelman already on 6/3 patient would like to keep that appointment. Message from [place] at [place]:   [place]   AW  9:24 AM Note   I spoke with Estill Bamberg from [place] this morning and she states that the patient will need OV or televisit talking about the [employer] and usage.  The notes we have are before the neb was ordered     Aaron Edelman please advise on this for his upcoming appointment with you. He already has medication for the nebulizer machine just no machine. Patient is also questioning if we got results back from sputum samples.

## 2019-07-06 ENCOUNTER — Other Ambulatory Visit: Payer: Self-pay

## 2019-07-06 ENCOUNTER — Encounter: Payer: Self-pay | Admitting: Pulmonary Disease

## 2019-07-06 ENCOUNTER — Ambulatory Visit (INDEPENDENT_AMBULATORY_CARE_PROVIDER_SITE_OTHER): Payer: No Typology Code available for payment source | Admitting: Pulmonary Disease

## 2019-07-06 VITALS — BP 118/72 | HR 105 | Temp 98.3°F | Ht 66.5 in | Wt 181.2 lb

## 2019-07-06 DIAGNOSIS — J209 Acute bronchitis, unspecified: Secondary | ICD-10-CM

## 2019-07-06 DIAGNOSIS — J440 Chronic obstructive pulmonary disease with acute lower respiratory infection: Secondary | ICD-10-CM | POA: Diagnosis not present

## 2019-07-06 DIAGNOSIS — I2699 Other pulmonary embolism without acute cor pulmonale: Secondary | ICD-10-CM | POA: Diagnosis not present

## 2019-07-06 DIAGNOSIS — A310 Pulmonary mycobacterial infection: Secondary | ICD-10-CM

## 2019-07-06 DIAGNOSIS — C3491 Malignant neoplasm of unspecified part of right bronchus or lung: Secondary | ICD-10-CM | POA: Diagnosis not present

## 2019-07-06 NOTE — Assessment & Plan Note (Signed)
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 Carlis Abbott today

## 2019-07-06 NOTE — Progress Notes (Signed)
_0  ID: Philip [name], male    DOB: 1955-10-20, 64 y.o.   MRN: [id number]  Chief Complaint  Patient presents with  . Hospitalization Follow-up    Recent pulmonary embolism, now managed on Eliquis    Referring provider: [name], MD  HPI:  64 year old male former smoker followed in our office for MAI, respiratory failure  PMH: Adenocarcinoma Smoker/ Smoking History: Former smoker.  Quit February/2021.  22.5-pack-year smoking history. Maintenance: Spiriva Respimat 2.5 Pt of: Dr. Carlis Abbott  07/06/2019  - Visit   64 year old male former smoker followed in our office for MAI, respiratory failure and COPD.  Patient completing hospital follow-up with our office today.  Excerpt from patient's hospital discharge listed below:  Admit date: 06/16/2019 Discharge date: 06/19/2019 Consultations:  Admitted From:  Disposition:   Discharge Diagnoses:  Active Problems:   Pneumonia   MAI (mycobacterium avium-intracellulare) infection (HCC)   Respiratory failure, acute ([place])   Adenocarcinoma of right lung, stage 3 (HCC)   PE (pulmonary thromboembolism) ([place])   COPD with acute bronchitis [facility])  Hospital Course Summary: 64 y.o.malewithh/o chronic tinnitis with hearing loss, prior smoker with COPDstage III adenocarcinoma of the lung diagnosed in December 2020(stopped XRT due tointolerance -severeesophagitis/weight loss,finished 6cycles ofchemotherapy, currently gettingimmunotherapy with PD1 inhibitor every 4 weeks,last dose on May 12) whowasdiagnosedwithpulmonary MAI infection(cavitary disease in the right lower lobe) at the time of his lung cancer diagnosis, nowpresented to the ED with c/o productive cough, pleuritic right sided chest pain,shortnessof breath. He deniedfever butreportedpoor appetite/constipation.. ED Course: Afebrile, tachycardic with HR 110s, sinus tachycardia on EKG,SARS-CoV-2 screening test negative.CTA of the chestrevealed new PEin the  RLLpulmonary artery.Acute on chronic infiltrate scattered throughout the right and leftlungs primarily within the upper lobes.New small right-sided pleural effusion.Stable masslike architectural distortion in the right hilum andright lower lobe similar to that seen on the prior examin March.CT of the abdomen and pelvis: No acute abnormality is seen. Hospital course: Patient admitted to [facility]with[facility]parin drip for PE,given vancomycin and cefepime for pneumonia.  1. Acute right lower lobe NW:GNFAOZ Doppler negative for DVT, echocardiogramwith LVEFandright ventricular systolic functionwnl.IV heparintransitionedto Eliquis5/16. Patient advised to avoid goody powders/NSAIDs (listed on home meds) while on anticoagulation and explained bleeding risk.  2.BilateralRUL/LUL bacterialpneumonia with new small right-sided pleural effusion-He does has history of MAI infection,case discussed with pulmonology Dr. Tressie Stalker who recommendto treatas bacterial infection for now ,if he improves can have outpatient follow-up with pulmonology for MAIinfection management.MRSA screening negative, sputum specimen first collection not acceptable for testing, repeat sputum culturein process. Now offvancomycin due to MRSA screening negative,remains oncefepime since 5/14. Will transition to oral Augmentin upon discharge to complete 7 day course. Respiratory viral panel, urine strep pneumo, procalcitonin are all wnl, urine legionella still in process.patient clinically improved. He does have post tussive bronchospasms, will prescribe antitussives/mucolytics/albuterol nebulizers on discharge  3.COPD/ history MAI in 01/2019:no wheezing. Resume prior inhaler regimen. F/U pulmonary upon discharge-updated pulmonary APP/Dr Mannam regarding discharge plan and need for f/u.  4.Stage IIIcadenocarcinoma of the lung,presented with large right hilar mass in addition to bilateral hilar and mediastinal lymphadenopathy  diagnosed in December 2020.He follows oncologyas OP-sent message through epic andadded Dr. Georgena Spurling care team list-lastseen forimmunotherapy on May 12. D/W Oncology APP who evaluated pateint today and added opiates for pain control. Advised to avoid NSAIDs  5. Acute hypoxic respiratory failure: Multifactorial In the setting of problems 1, 2, 3 and 4 as listed above. currently saturating well on 2 lits o2 via [place].Will obtain home o2 evaluation prior to discharge.  Patient was last seen in our office in March/2021.  He is followed by Dr. Carlis Abbott.  He was also seen by oncology Dr. Earlie Server.  His oncology course was stopped after 6 cycles and radiation therapy was stopped early due to esophagitis, nausea and 5 pound weight loss.  He is getting started on immunotherapy.  He is also been evaluated by 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  Hearing aids have been recommended.  Patient reports that he was discharged on Eliquis.  He reports that he has been tolerating this well.  No issues with bleeding or hemoptysis.  No nosebleeds.  Patient has never had any VTE events before.  No history of DVT or PE.  He is scheduled for 07/10/2019 CT chest with Dr. Earlie Server.  He is currently 3 weeks into immunotherapy per the patient.  He seems to be tolerating this okay.  Questionaires / Pulmonary Flowsheets:   ACT:  No flowsheet data found.  MMRC: No flowsheet data found.  Epworth:  No flowsheet data found.  Tests:  Chest Imaging: CT PET 01/03/2019-severe emphysema, right hilar mass with air bronchograms, medial RLL cystic fibrotic changes.  PET avidity of the inferior portion of the mass and medial fibrotic areas of the lower lobe.  CT chest with contrast 04/10/2018-persistent right mass, unchanged right paraseptal emphysema versus cavitary lesions.  Improved adenopathy.   Micro: 01/09/2019 AFB: Component 1 mo ago   Organism ID [id number]    Comment: Mycobacterium avium complex  Amikacin Comment   Comment: 16.0 ug/mL Susceptible  Clarithromycin 2.0 ug/mL Susceptible   Linezolid Comment   Comment: 16.0 ug/mL Intermediate  Moxifloxacin 4.0 ug/mL Resistant   Streptomycin 64.0 ug/mL    01/09/2019 fungus culture-negative 01/09/2019 BAL-many PMN, negative culture  Pulmonary Functions Testing Results: 2020- mild obstruction without significant bronchodilator reversibility.  No significant restriction, air trapping, or hyperinflation.  Mildly reduced diffusion.  Flow volume loop supports obstruction.  Pathology 01/09/2019: RLL adenocarcinoma November 2020 bronc nondiagnostic       FENO:  No results found for: NITRICOXIDE  PFT: PFT Results Latest Ref Rng & Units 12/21/2018  FVC-Pre L 3.07  FVC-Predicted Pre % 76  FVC-Post L 3.23  FVC-Predicted Post % 80  Pre FEV1/FVC % % 67  Post FEV1/FCV % % 71  FEV1-Pre L 2.07  FEV1-Predicted Pre % 68  FEV1-Post L 2.28  DLCO UNC% % 64  DLCO COR %Predicted % 79  TLC L 5.35  TLC % Predicted % 86  RV % Predicted % 104    WALK:  No flowsheet data found.  Imaging: DG Chest 2 View  Result Date: 06/16/2019 CLINICAL DATA:  Chest pain and shortness of breath. Lung cancer patient. EXAM: CHEST - 2 VIEW COMPARISON:  06/15/2019 FINDINGS: Worsening of widespread pulmonary density which could be worsening pneumonia, more likely than edema. No dense consolidation, collapse or effusion. IMPRESSION: Worsening bilateral pulmonary density worrisome for worsening pneumonia. Electronically Signed   By: Nelson Chimes M.D.   On: 06/16/2019 14:55   DG Chest 2 View  Result Date: 06/15/2019 CLINICAL DATA:  Chest pain EXAM: CHEST - 2 VIEW COMPARISON:  CT 04/10/2019 FINDINGS: Asymmetric elevation of the right hemidiaphragm with right basilar volume loss and architectural distortion likely related to the dense right perihilar scarring and masslike opacity as well as more  medial lucent changes and distortion seen on comparison CT. Left perihilar opacity is possibly increased from comparison CT and radiography.  Additional streaky opacities in the bases likely reflect atelectasis. No pneumothorax or visible effusion. No acute osseous or soft tissue abnormality. Exaggerated thoracic kyphosis. Degenerative changes are present in the imaged spine and shoulders. IMPRESSION: New asymmetric elevation of the right hemidiaphragm with right basilar volume loss in a region of scarring and architectural distortion seen on comparison CT. Superimposed consolidation or infection this location is not excluded. Increasing left perihilar density is noted as well. Could reflect some consolidative process though developing lesion is not completely excluded. Could consider further evaluation with CT imaging. Electronically Signed   By: Lovena Le M.D.   On: 06/15/2019 22:18   CT Angio Chest PE W and/or Wo Contrast  Result Date: 06/16/2019 CLINICAL DATA:  Shortness of breath and chest pain on the right with abdominal distension and black tarry stools. EXAM: CT ANGIOGRAPHY CHEST CT ABDOMEN AND PELVIS WITH CONTRAST TECHNIQUE: Multidetector CT imaging of the chest was performed using the standard protocol during bolus administration of intravenous contrast. Multiplanar CT image reconstructions and MIPs were obtained to evaluate the vascular anatomy. Multidetector CT imaging of the abdomen and pelvis was performed using the standard protocol during bolus administration of intravenous contrast. CONTRAST:  155m OMNIPAQUE IOHEXOL 350 MG/ML SOLN COMPARISON:  04/10/2019 FINDINGS: CTA CHEST FINDINGS Cardiovascular: Thoracic aorta demonstrates mild atherosclerotic calcifications without aneurysmal dilatation. No cardiac enlargement is seen. No sizable pericardial effusion is noted. The pulmonary artery shows a normal branching pattern. Singular filling defect is noted in the medial right lower lobe best seen  on image number 121 of series 5. This is new from a recent CT of the chest from 04/10/2019. No other definitive pulmonary embolism is seen. Mediastinum/Nodes: Thoracic inlet is within normal limits. The esophagus as visualized is unremarkable. Diffuse fullness of the right hilar region is noted similar to that seen on the recent exam. No significant left-sided hilar adenopathy is noted. The esophagus as visualized is within normal limits. Lungs/Pleura: Small right-sided pleural effusion is noted new from the prior exam. Area of masslike architectural distortion in the right perihilar region and right lower lobe is again identified. Some new patchy infiltrative changes noted in the right upper lobe increased when compared with the prior exam. Some similar subpleural infiltrate is noted in the left upper lobe anteriorly. Diffuse emphysematous changes are noted. Musculoskeletal: Degenerative changes of the thoracic spine are noted. No acute bony abnormality is noted. Review of the MIP images confirms the above findings. CT ABDOMEN and PELVIS FINDINGS Hepatobiliary: No focal liver abnormality is seen. No gallstones, gallbladder wall thickening, or biliary dilatation. Pancreas: Unremarkable. No pancreatic ductal dilatation or surrounding inflammatory changes. Spleen: Normal in size without focal abnormality. Adrenals/Urinary Tract: Stable thickening of the right adrenal gland is noted. 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 Kidneys demonstrate a normal enhancement pattern. No renal calculi or obstructive changes are noted. Delayed images demonstrate normal excretion of contrast material. The bladder is well distended. Stomach/Bowel: The appendix is within normal limits. No obstructive or inflammatory changes of the colon are seen. The stomach is decompressed. No definitive ulcer is seen within the stomach. Small bowel demonstrates some scattered fluid although no obstructive changes are  noted. Vascular/Lymphatic: Aortic atherosclerosis. No enlarged abdominal or pelvic lymph nodes. Reproductive: Prostate is unremarkable. Other: No abdominal wall hernia or abnormality. No abdominopelvic ascites. Musculoskeletal: Mild degenerative changes of lumbar spine are seen. Review of the MIP images confirms the above findings. IMPRESSION: CTA of the chest: Singular pulmonary embolism  in the right lower lobe pulmonary artery medially new from the prior exam of 04/10/2019 Acute on chronic infiltrate scattered throughout the right and left lungs primarily within the upper lobes. Stable masslike architectural distortion in the right hilum and right lower lobe similar to that seen on the prior exam. New small right-sided pleural effusion. CT of the abdomen and pelvis: No acute abnormality is seen. Electronically Signed   By: Inez Catalina M.D.   On: 06/16/2019 17:07   CT ABDOMEN PELVIS W CONTRAST  Result Date: 06/16/2019 CLINICAL DATA:  Shortness of breath and chest pain on the right with abdominal distension and black tarry stools. EXAM: CT ANGIOGRAPHY CHEST CT ABDOMEN AND PELVIS WITH CONTRAST TECHNIQUE: Multidetector CT imaging of the chest was performed using the standard protocol during bolus administration of intravenous contrast. Multiplanar CT image reconstructions and MIPs were obtained to evaluate the vascular anatomy. Multidetector CT imaging of the abdomen and pelvis was performed using the standard protocol during bolus administration of intravenous contrast. CONTRAST:  14m OMNIPAQUE IOHEXOL 350 MG/ML SOLN COMPARISON:  04/10/2019 FINDINGS: CTA CHEST FINDINGS Cardiovascular: Thoracic aorta demonstrates mild atherosclerotic calcifications without aneurysmal dilatation. No cardiac enlargement is seen. No sizable pericardial effusion is noted. The pulmonary artery shows a normal branching pattern. Singular filling defect is noted in the medial right lower lobe best seen on image number 121 of series 5.  This is new from a recent CT of the chest from 04/10/2019. No other definitive pulmonary embolism is seen. Mediastinum/Nodes: Thoracic inlet is within normal limits. The esophagus as visualized is unremarkable. Diffuse fullness of the right hilar region is noted similar to that seen on the recent exam. No significant left-sided hilar adenopathy is noted. The esophagus as visualized is within normal limits. Lungs/Pleura: Small right-sided pleural effusion is noted new from the prior exam. Area of masslike architectural distortion in the right perihilar region and right lower lobe is again identified. Some new patchy infiltrative changes noted in the right upper lobe increased when compared with the prior exam. Some similar subpleural infiltrate is noted in the left upper lobe anteriorly. Diffuse emphysematous changes are noted. Musculoskeletal: Degenerative changes of the thoracic spine are noted. No acute bony abnormality is noted. Review of the MIP images confirms the above findings. CT ABDOMEN and PELVIS FINDINGS Hepatobiliary: No focal liver abnormality is seen. No gallstones, gallbladder wall thickening, or biliary dilatation. Pancreas: Unremarkable. No pancreatic ductal dilatation or surrounding inflammatory changes. Spleen: Normal in size without focal abnormality. Adrenals/Urinary Tract: Stable thickening of the right adrenal gland is noted. 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 Kidneys demonstrate a normal enhancement pattern. No renal calculi or obstructive changes are noted. Delayed images demonstrate normal excretion of contrast material. The bladder is well distended. Stomach/Bowel: The appendix is within normal limits. No obstructive or inflammatory changes of the colon are seen. The stomach is decompressed. No definitive ulcer is seen within the stomach. Small bowel demonstrates some scattered fluid although no obstructive changes are noted. Vascular/Lymphatic: Aortic  atherosclerosis. No enlarged abdominal or pelvic lymph nodes. Reproductive: Prostate is unremarkable. Other: No abdominal wall hernia or abnormality. No abdominopelvic ascites. Musculoskeletal: Mild degenerative changes of lumbar spine are seen. Review of the MIP images confirms the above findings. IMPRESSION: CTA of the chest: Singular pulmonary embolism in the right lower lobe pulmonary artery medially new from the prior exam of 04/10/2019 Acute on chronic infiltrate scattered throughout the right and left lungs primarily within the upper lobes.  Stable masslike architectural distortion in the right hilum and right lower lobe similar to that seen on the prior exam. New small right-sided pleural effusion. CT of the abdomen and pelvis: No acute abnormality is seen. Electronically Signed   By: Inez Catalina M.D.   On: 06/16/2019 17:07   ECHOCARDIOGRAM COMPLETE  Result Date: 06/17/2019    ECHOCARDIOGRAM REPORT   Patient Name:   RANARD HARTE Date of Exam: 06/17/2019 Medical Rec #:  [id number]        Height:       66.0 in Accession #:    [id number]       Weight:       187.4 lb Date of Birth:  1955/03/05        BSA:          1.945 m Patient Age:    28 years         BP:           92/68 mmHg Patient Gender: M                HR:           95 bpm. Exam Location:  Inpatient Procedure: 2D Echo Indications:    Pulmonary Embolus I26.99  History:        Patient has no prior history of Echocardiogram examinations.                 Risk Factors:Current Smoker and Dyslipidemia.  Sonographer:    Mikki Santee RDCS (AE) Referring Phys: [id number] [place]  1. Left ventricular ejection fraction, by estimation, is 60 to 65%. The left ventricle has normal function. The left ventricle has no regional wall motion abnormalities. Left ventricular diastolic parameters were normal.  2. Right ventricular systolic function is normal. The right ventricular size is normal.  3. The mitral valve is normal in structure. Trivial mitral  valve regurgitation. No evidence of mitral stenosis.  4. The aortic valve is tricuspid. Aortic valve regurgitation is not visualized. Mild aortic valve sclerosis is present, with no evidence of aortic valve stenosis.  5. The inferior vena cava is normal in size with greater than 50% respiratory variability, suggesting right atrial pressure of 3 mmHg. FINDINGS  Left Ventricle: Left ventricular ejection fraction, by estimation, is 60 to 65%. The left ventricle has normal function. The left ventricle has no regional wall motion abnormalities. The left ventricular internal cavity size was normal in size. There is  no left ventricular hypertrophy. Left ventricular diastolic parameters were normal. Right Ventricle: The right ventricular size is normal. No increase in right ventricular wall thickness. Right ventricular systolic function is normal. Left Atrium: Left atrial size was normal in size. Right Atrium: Right atrial size was normal in size. Pericardium: There is no evidence of pericardial effusion. The pericardial effusion is posterior to the left ventricle. Mitral Valve: The mitral valve is normal in structure. Normal mobility of the mitral valve leaflets. Trivial mitral valve regurgitation. No evidence of mitral valve stenosis. Tricuspid Valve: The tricuspid valve is normal in structure. Tricuspid valve regurgitation is mild . No evidence of tricuspid stenosis. Aortic Valve: The aortic valve is tricuspid. Aortic valve regurgitation is not visualized. Mild aortic valve sclerosis is present, with no evidence of aortic valve stenosis. Pulmonic Valve: The pulmonic valve was normal in structure. Pulmonic valve regurgitation is not visualized. No evidence of pulmonic stenosis. Aorta: The aortic root is normal in size and structure. Venous: The inferior vena cava is normal  in size with greater than 50% respiratory variability, suggesting right atrial pressure of 3 mmHg. IAS/Shunts: The interatrial septum was not well  visualized.  LEFT VENTRICLE PLAX 2D LVIDd:         5.10 cm  Diastology LVIDs:         3.40 cm  LV e' lateral:   12.30 cm/s LV PW:         1.00 cm  LV E/e' lateral: 3.7 LV IVS:        1.00 cm  LV e' medial:    7.62 cm/s LVOT diam:     2.20 cm  LV E/e' medial:  5.9 LV SV:         68 LV SV Index:   35 LVOT Area:     3.80 cm  RIGHT VENTRICLE RV S prime:     11.50 cm/s TAPSE (M-mode): 1.4 cm LEFT ATRIUM             Index       RIGHT ATRIUM           Index LA diam:        3.20 cm 1.65 cm/m  RA Area:     11.70 cm LA Vol (A2C):   18.7 ml 9.61 ml/m  RA Volume:   17.10 ml  8.79 ml/m LA Vol (A4C):   36.5 ml 18.76 ml/m LA Biplane Vol: 27.9 ml 14.34 ml/m  AORTIC VALVE LVOT Vmax:   92.40 cm/s LVOT Vmean:  60.400 cm/s LVOT VTI:    0.180 m  AORTA Ao Root diam: 3.60 cm MITRAL VALVE MV Area (PHT): 2.71 cm    SHUNTS MV Decel Time: 280 msec    Systemic VTI:  0.18 m MV E velocity: 44.90 cm/s  Systemic Diam: 2.20 cm MV A velocity: 54.80 cm/s MV E/A ratio:  0.82 Jenkins Rouge MD Electronically signed by Jenkins Rouge MD Signature Date/Time: 06/17/2019/10:38:16 AM    Final    VAS [place] LOWER EXTREMITY VENOUS (DVT)  Result Date: 06/18/2019  Lower Venous DVTStudy Indications: Pulmonary embolism.  Risk Factors: Lung cancer. Comparison Study: none Performing Technologist: June Leap RDMS, RVT  Examination Guidelines: A complete evaluation includes B-mode imaging, spectral Doppler, color Doppler, and power Doppler as needed of all accessible portions of each vessel. Bilateral testing is considered an integral part of a complete examination. Limited examinations for reoccurring indications may be performed as noted. The reflux portion of the exam is performed with the patient in reverse Trendelenburg.  +---------+---------------+---------+-----------+----------+--------------+ RIGHT    CompressibilityPhasicitySpontaneityPropertiesThrombus Aging +---------+---------------+---------+-----------+----------+--------------+ CFV       Full           Yes      Yes                                 +---------+---------------+---------+-----------+----------+--------------+ SFJ      Full                                                        +---------+---------------+---------+-----------+----------+--------------+ FV Prox  Full                                                        +---------+---------------+---------+-----------+----------+--------------+  FV Mid   Full                                                        +---------+---------------+---------+-----------+----------+--------------+ FV DistalFull                                                        +---------+---------------+---------+-----------+----------+--------------+ PFV      Full                                                        +---------+---------------+---------+-----------+----------+--------------+ POP      Full           Yes      Yes                                 +---------+---------------+---------+-----------+----------+--------------+ PTV      Full                                                        +---------+---------------+---------+-----------+----------+--------------+ PERO     Full                                                        +---------+---------------+---------+-----------+----------+--------------+   +---------+---------------+---------+-----------+----------+--------------+ LEFT     CompressibilityPhasicitySpontaneityPropertiesThrombus Aging +---------+---------------+---------+-----------+----------+--------------+ CFV      Full           Yes      Yes                                 +---------+---------------+---------+-----------+----------+--------------+ SFJ      Full                                                        +---------+---------------+---------+-----------+----------+--------------+ FV Prox  Full                                                         +---------+---------------+---------+-----------+----------+--------------+ FV Mid   Full                                                        +---------+---------------+---------+-----------+----------+--------------+  FV DistalFull                                                        +---------+---------------+---------+-----------+----------+--------------+ PFV      Full                                                        +---------+---------------+---------+-----------+----------+--------------+ POP      Full           Yes      Yes                                 +---------+---------------+---------+-----------+----------+--------------+ PTV      Full                                                        +---------+---------------+---------+-----------+----------+--------------+ PERO     Full                                                        +---------+---------------+---------+-----------+----------+--------------+     Summary: BILATERAL: - No evidence of deep vein thrombosis seen in the lower extremities, bilaterally. -No evidence of popliteal cyst, bilaterally.   *See table(s) above for measurements and observations. Electronically signed by Ruta Hinds MD on 06/18/2019 at 8:52:01 AM.    Final     Lab Results:  CBC    Component Value Date/Time   WBC 6.3 06/19/2019 0351   RBC 4.34 06/19/2019 0351   HGB 13.3 06/19/2019 0351   HGB 15.0 06/14/2019 0900   HCT 41.2 06/19/2019 0351   PLT 244 06/19/2019 0351   PLT 297 06/14/2019 0900   MCV 94.9 06/19/2019 0351   MCH 30.6 06/19/2019 0351   MCHC 32.3 06/19/2019 0351   RDW 12.0 06/19/2019 0351   LYMPHSABS 0.8 06/14/2019 0900   MONOABS 0.7 06/14/2019 0900   EOSABS 0.1 06/14/2019 0900   BASOSABS 0.1 06/14/2019 0900    BMET    Component Value Date/Time   NA 135 06/19/2019 0351   K 4.0 06/19/2019 0351   CL 100 06/19/2019 0351   CO2 28 06/19/2019 0351   GLUCOSE 104 (H)  06/19/2019 0351   BUN 12 06/19/2019 0351   CREATININE 0.76 06/19/2019 0351   CREATININE 1.03 06/14/2019 0900   CALCIUM 8.6 (L) 06/19/2019 0351   GFRNONAA >60 06/19/2019 0351   GFRNONAA >60 06/14/2019 0900   GFRAA >60 06/19/2019 0351   GFRAA >60 06/14/2019 0900    BNP    Component Value Date/Time   BNP 63.5 06/16/2019 1517    ProBNP No results found for: PROBNP  Specialty Problems      Pulmonary Problems   Lung mass   Pneumothorax   Adenocarcinoma of right lung, stage 3 ([place])  COPD with acute bronchitis (HCC)   Pneumonia   Respiratory failure, acute (HCC)      Allergies  Allergen Reactions  . Norco [Hydrocodone-Acetaminophen] Other (See Comments)    Made pt feel jittery     Immunization History  Administered Date(s) Administered  . Influenza,inj,Quad PF,6+ Mos 11/08/2018  . PFIZER SARS-COV-2 Vaccination 04/27/2019, 05/11/2019  . Td 05/18/2014    Past Medical History:  Diagnosis Date  . Cancer ([place])   . Hyperlipidemia   . Pneumonia   . Tobacco abuse     Tobacco History: Social History   Tobacco Use  Smoking Status Former Smoker  . Packs/day: 0.50  . Years: 45.00  . Pack years: 22.50  . Types: Cigarettes  . Start date: 07/04/1970  . Quit date: 03/14/2019  . Years since quitting: 0.3  Smokeless Tobacco Former Systems developer  . Types: Chew   Counseling given: Yes   Continue to not smoke  Outpatient Encounter Medications as of 07/06/2019  Medication Sig  . albuterol (PROVENTIL) (2.5 MG/3ML) 0.083% nebulizer solution Take 3 mLs (2.5 mg total) by nebulization every 6 (six) hours as needed for wheezing or shortness of breath.  Marland Kitchen albuterol (VENTOLIN HFA) 108 (90 Base) MCG/ACT inhaler Inhale 2 puffs into the lungs every 4 (four) hours as needed for wheezing or shortness of breath.  . APIXABAN (ELIQUIS) VTE STARTER PACK (10MG AND 5MG) Take as directed on package: start with two-29m tablets twice daily for 7 days. On day 8, switch to one-570mtablet twice daily.  . Marland Kitchenatorvastatin (LIPITOR) 20 MG tablet Take 20 mg by mouth daily.  . Marland Kitchenismuth subsalicylate (PEPTO BISMOL) 262 MG/15ML suspension Take 30 mLs by mouth every 6 (six) hours as needed for indigestion.  . lidocaine (LIDODERM) 5 % Place 1 patch onto the skin daily. Remove & Discard patch within 12 hours or as directed by MD  . Nebulizers (COMPRESSOR/NEBULIZER) MISC 1 application by Does not apply route every 4 (four) hours as needed.  . Marland Kitchenmeprazole-sodium bicarbonate (ZEGERID) 40-1100 MG capsule Take 1 capsule by mouth daily before breakfast.  . Tetrahydrozoline HCl (VISINE OP) Place 1 drop into both eyes daily as needed (redness).  . Tiotropium Bromide Monohydrate (SPIRIVA RESPIMAT) 1.25 MCG/ACT AERS Inhale 1 puff into the lungs daily.  . benzonatate (TESSALON) 100 MG capsule Take 1 capsule (100 mg total) by mouth 3 (three) times daily as needed for cough. (Patient not taking: Reported on 07/06/2019)  . famotidine (PEPCID) 20 MG tablet Take 20 mg by mouth 2 (two) times daily.  . polyethylene glycol (MIRALAX / GLYCOLAX) 17 g packet Take 17 g by mouth daily. (Patient not taking: Reported on 07/06/2019)  . senna-docusate (SENOKOT-S) 8.6-50 MG tablet Take 1 tablet by mouth 2 (two) times daily. (Patient not taking: Reported on 07/06/2019)  . sildenafil (REVATIO) 20 MG tablet Take 20-60 mg by mouth daily as needed (ED).   . sucralfate (CARAFATE) 1 g tablet Take 1 tablet (1 g total) by mouth 4 (four) times daily -  with meals and at bedtime. Dissolve in 10 mL of warm water prior to swallowing (Patient not taking: Reported on 06/16/2019)   No facility-administered encounter medications on file as of 07/06/2019.     Review of Systems  Review of Systems  Constitutional: Positive for fatigue. Negative for activity change, chills, fever and unexpected weight change.  HENT: Negative for postnasal drip, rhinorrhea, sinus pressure, sinus pain and sore throat.   Eyes: Negative.   Respiratory: Positive for shortness of breath.  Negative for cough and wheezing.   Cardiovascular: Negative for chest pain and palpitations.  Gastrointestinal: Negative for constipation, diarrhea, nausea and vomiting.  Endocrine: Negative.   Genitourinary: Negative.   Musculoskeletal: Negative.   Skin: Negative.   Neurological: Negative for dizziness and headaches.  Psychiatric/Behavioral: Negative.  Negative for dysphoric mood. The patient is not nervous/anxious.   All other systems reviewed and are negative.    Physical Exam  BP 118/72 (BP Location: Left Arm, Cuff Size: Normal)   Pulse (!) 105   Temp 98.3 F (36.8 C) (Oral)   Ht 5' 6.5" (1.689 m)   Wt 181 lb 3.2 oz (82.2 kg)   SpO2 95%   BMI 28.81 kg/m   Wt Readings from Last 5 Encounters:  07/06/19 181 lb 3.2 oz (82.2 kg)  06/16/19 187 lb 6.3 oz (85 kg)  06/15/19 187 lb 6.3 oz (85 kg)  06/14/19 180 lb 11.2 oz (82 kg)  05/17/19 183 lb 14.4 oz (83.4 kg)    BMI Readings from Last 5 Encounters:  07/06/19 28.81 kg/m  06/16/19 30.25 kg/m  06/15/19 30.25 kg/m  06/14/19 29.17 kg/m  05/17/19 29.68 kg/m     Physical Exam Vitals and nursing note reviewed.  Constitutional:      General: He is not in acute distress.    Appearance: Normal appearance. He is normal weight.  HENT:     Head: Normocephalic and atraumatic.     Right Ear: Hearing and external ear normal.     Left Ear: Hearing and external ear normal.     Nose: Nose normal. No mucosal edema or rhinorrhea.     Right Turbinates: Not enlarged.     Left Turbinates: Not enlarged.     Mouth/Throat:     Mouth: Mucous membranes are dry.     Pharynx: Oropharynx is clear. No oropharyngeal exudate.  Eyes:     Pupils: Pupils are equal, round, and reactive to light.  Cardiovascular:     Rate and Rhythm: Normal rate and regular rhythm.     Pulses: Normal pulses.     Heart sounds: Normal heart sounds. No murmur.  Pulmonary:     Effort: Pulmonary effort is normal.     Breath sounds: Normal breath sounds. No  decreased breath sounds, wheezing or rales.  Musculoskeletal:     Cervical back: Normal range of motion.     Right lower leg: No edema.     Left lower leg: No edema.  Lymphadenopathy:     Cervical: No cervical adenopathy.  Skin:    General: Skin is warm and dry.     Capillary Refill: Capillary refill takes less than 2 seconds.     Findings: No erythema or rash.  Neurological:     General: No focal deficit present.     Mental Status: He is alert and oriented to person, place, and time.     Motor: No weakness.     Coordination: Coordination normal.     Gait: Gait is intact. Gait normal.  Psychiatric:        Mood and Affect: Mood normal.        Behavior: Behavior normal. Behavior is cooperative.        Thought Content: Thought content normal.        Judgment: Judgment normal.       Assessment & Plan:   PE (pulmonary thromboembolism) ([place]) May/2021 CTA chest shows right lower lobe PE Maintained on Eliquis No history of VTE events in the past  PE felt to be related to ongoing adenocarcinoma  Plan: We will tentatively plan for 6 months of Eliquis treatment May need to consider referral to hematology prior to stopping DOAC treatment 38-monthfollow-up with our office Keep follow-up with oncology  [place] Plan: Continue Spiriva  Aden[place] of right lung, stage 3 ([place] Plan: Complete CT of chest in 07/10/2019 Complete follow-up with oncology  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 CCarlis Abbotttoday    Return in about 3 months (around 10/06/2019), or if symptoms worsen or fail to improve, for Follow up with Dr. CCarlis Abbott   BLauraine Rinne NP 07/06/2019   This appointment required 45 minutes  of patient care (this includes precharting, chart review, review of results, face-to-face care, etc.).

## 2019-07-06 NOTE — Patient Instructions (Addendum)
You were seen today by Lauraine Rinne, NP  for:   1. PE (pulmonary thromboembolism) ([place])  Continue Eliquis  Keep follow-up with Dr. Earlie Server next week with a repeat CT  We will plan on tentative treatment therapy of at least 6 months  2. COPD with acute bronchitis (HCC)  Spiriva Respimat 2.5 >>> 2 puffs daily >>> Do this every day >>>This is not a rescue inhaler  Okay to use albuterol nebulized meds every 6 hours as needed for shortness of breath or wheezing  Note your daily symptoms > remember "red flags" for COPD:   >>>Increase in cough >>>increase in sputum production >>>increase in shortness of breath or activity  intolerance.   If you notice these symptoms, please call the office to be seen.    3. Adenocarcinoma of right lung, stage 3 (HCC)  Continue follow-up with Dr. Earlie Server  Continue immunotherapy  Complete CT of chest on 07/10/2019  4. 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 Carlis Abbott.   Please do your part to reduce the spread of COVID-19:      Reduce your risk of any infection  and COVID19 by using the similar precautions used for avoiding the common cold or flu:  Marland Kitchen Wash your hands often with soap and warm water for at least 20 seconds.  If soap and water are not readily available, use an alcohol-based hand sanitizer with at least 60% alcohol.  . If coughing or sneezing, cover your mouth and nose by coughing or sneezing into the elbow areas of your shirt or coat, into a tissue or into your sleeve (not your hands). Langley Gauss A MASK when in public  . Avoid shaking hands with others and consider head nods or verbal greetings only. . Avoid touching your eyes, nose, or mouth with unwashed hands.  . Avoid close contact with people who are sick. . Avoid places or events with large  numbers of people in one location, like concerts or sporting events. . If you have some symptoms but not all symptoms, continue to monitor at home and seek medical attention if your symptoms worsen. . If you are having a medical emergency, call 911.   [place] / e-Visit: [email or web address]         [facility]: [facility]: [phone]                   [facility]: [phone]     It is flu season:   >>> Best ways to protect herself from the flu: Receive the yearly flu vaccine, practice good hand hygiene washing with soap and also using hand sanitizer when available, eat a nutritious meals, get adequate rest, hydrate appropriately   Please contact the office if your symptoms worsen or you have concerns that you are not improving.   Thank you for choosing [facility] for your healthcare, and for allowing [place] to partner with you on your healthcare journey. I am thankful to be able to provide care to you today.   Wyn Quaker FNP-C

## 2019-07-06 NOTE — Assessment & Plan Note (Signed)
Plan: Complete CT of chest in 07/10/2019 Complete follow-up with oncology

## 2019-07-06 NOTE — Assessment & Plan Note (Signed)
May/2021 CTA chest shows right lower lobe PE Maintained on Eliquis No history of VTE events in the past PE felt to be related to ongoing adenocarcinoma  Plan: We will tentatively plan for 6 months of Eliquis treatment May need to consider referral to hematology prior to stopping DOAC treatment 64-month follow-up with our office Keep follow-up with oncology

## 2019-07-06 NOTE — Assessment & Plan Note (Signed)
Plan: Continue Spiriva

## 2019-07-06 NOTE — Progress Notes (Signed)
Pharmacist Chemotherapy Monitoring - Follow Up Assessment    I verify that I have reviewed each item in the below checklist:  . Regimen for the patient is scheduled for the appropriate day and plan matches scheduled date. Marland Kitchen Appropriate non-routine labs are ordered dependent on drug ordered. . If applicable, additional medications reviewed and ordered per protocol based on lifetime cumulative doses and/or treatment regimen.   Plan for follow-up and/or issues identified: No . I-vent associated with next due treatment: No . MD and/or nursing notified: No  Britt Boozer 07/06/2019 11:13 AM

## 2019-07-10 ENCOUNTER — Encounter (HOSPITAL_COMMUNITY): Payer: Self-pay

## 2019-07-10 ENCOUNTER — Ambulatory Visit (HOSPITAL_COMMUNITY)
Admission: RE | Admit: 2019-07-10 | Discharge: 2019-07-10 | Disposition: A | Discharge status: Home / Self Care | Payer: No Typology Code available for payment source | Source: Ambulatory Visit | Attending: Internal Medicine | Admitting: Internal Medicine

## 2019-07-10 ENCOUNTER — Other Ambulatory Visit: Payer: Self-pay

## 2019-07-10 DIAGNOSIS — C3490 Malignant neoplasm of unspecified part of unspecified bronchus or lung: Secondary | ICD-10-CM | POA: Diagnosis not present

## 2019-07-10 MED ORDER — SODIUM CHLORIDE (PF) 0.9 % IJ SOLN
INTRAMUSCULAR | Status: AC
  Filled 2019-07-10: qty 50

## 2019-07-10 MED ORDER — IOHEXOL 300 MG/ML  SOLN
75.0000 mL | Freq: Once | INTRAMUSCULAR | Status: AC | PRN
  Administered 2019-07-10: 75 mL via INTRAVENOUS

## 2019-07-12 ENCOUNTER — Inpatient Hospital Stay: Payer: No Typology Code available for payment source

## 2019-07-12 ENCOUNTER — Inpatient Hospital Stay: Payer: No Typology Code available for payment source | Attending: Internal Medicine | Admitting: Internal Medicine

## 2019-07-12 ENCOUNTER — Other Ambulatory Visit: Payer: Self-pay

## 2019-07-12 ENCOUNTER — Encounter: Payer: Self-pay | Admitting: Internal Medicine

## 2019-07-12 VITALS — BP 99/75 | HR 86 | Temp 97.5°F | Resp 17 | Ht 66.5 in | Wt 182.6 lb

## 2019-07-12 DIAGNOSIS — Z79899 Other long term (current) drug therapy: Secondary | ICD-10-CM | POA: Diagnosis not present

## 2019-07-12 DIAGNOSIS — Z7901 Long term (current) use of anticoagulants: Secondary | ICD-10-CM | POA: Insufficient documentation

## 2019-07-12 DIAGNOSIS — J439 Emphysema, unspecified: Secondary | ICD-10-CM | POA: Diagnosis not present

## 2019-07-12 DIAGNOSIS — C3491 Malignant neoplasm of unspecified part of right bronchus or lung: Secondary | ICD-10-CM

## 2019-07-12 DIAGNOSIS — D3502 Benign neoplasm of left adrenal gland: Secondary | ICD-10-CM | POA: Diagnosis not present

## 2019-07-12 DIAGNOSIS — J849 Interstitial pulmonary disease, unspecified: Secondary | ICD-10-CM | POA: Insufficient documentation

## 2019-07-12 DIAGNOSIS — E785 Hyperlipidemia, unspecified: Secondary | ICD-10-CM | POA: Diagnosis not present

## 2019-07-12 DIAGNOSIS — I700 Atherosclerosis of aorta: Secondary | ICD-10-CM | POA: Insufficient documentation

## 2019-07-12 DIAGNOSIS — J700 Acute pulmonary manifestations due to radiation: Secondary | ICD-10-CM | POA: Diagnosis not present

## 2019-07-12 DIAGNOSIS — C3411 Malignant neoplasm of upper lobe, right bronchus or lung: Secondary | ICD-10-CM | POA: Insufficient documentation

## 2019-07-12 DIAGNOSIS — J90 Pleural effusion, not elsewhere classified: Secondary | ICD-10-CM | POA: Insufficient documentation

## 2019-07-12 DIAGNOSIS — D3501 Benign neoplasm of right adrenal gland: Secondary | ICD-10-CM | POA: Insufficient documentation

## 2019-07-12 DIAGNOSIS — Z5112 Encounter for antineoplastic immunotherapy: Secondary | ICD-10-CM

## 2019-07-12 LAB — CBC WITH DIFFERENTIAL (CANCER CENTER ONLY)
Abs Immature Granulocytes: 0.01 10*3/uL (ref 0.00–0.07)
Basophils Absolute: 0 10*3/uL (ref 0.0–0.1)
Basophils Relative: 1 %
Eosinophils Absolute: 0.2 10*3/uL (ref 0.0–0.5)
Eosinophils Relative: 3 %
HCT: 43 % (ref 39.0–52.0)
Hemoglobin: 14.5 g/dL (ref 13.0–17.0)
Immature Granulocytes: 0 %
Lymphocytes Relative: 11 %
Lymphs Abs: 0.6 10*3/uL — ABNORMAL LOW (ref 0.7–4.0)
MCH: 30.2 pg (ref 26.0–34.0)
MCHC: 33.7 g/dL (ref 30.0–36.0)
MCV: 89.6 fL (ref 80.0–100.0)
Monocytes Absolute: 0.6 10*3/uL (ref 0.1–1.0)
Monocytes Relative: 11 %
Neutro Abs: 4.1 10*3/uL (ref 1.7–7.7)
Neutrophils Relative %: 74 %
Platelet Count: 189 10*3/uL (ref 150–400)
RBC: 4.8 MIL/uL (ref 4.22–5.81)
RDW: 12.5 % (ref 11.5–15.5)
WBC Count: 5.6 10*3/uL (ref 4.0–10.5)
nRBC: 0 % (ref 0.0–0.2)

## 2019-07-12 LAB — CMP (CANCER CENTER ONLY)
ALT: 22 U/L (ref 0–44)
AST: 17 U/L (ref 15–41)
Albumin: 3.3 g/dL — ABNORMAL LOW (ref 3.5–5.0)
Alkaline Phosphatase: 100 U/L (ref 38–126)
Anion gap: 12 (ref 5–15)
BUN: 10 mg/dL (ref 8–23)
CO2: 22 mmol/L (ref 22–32)
Calcium: 9.4 mg/dL (ref 8.9–10.3)
Chloride: 108 mmol/L (ref 98–111)
Creatinine: 0.83 mg/dL (ref 0.61–1.24)
GFR, Est AFR Am: >60 mL/min (ref >60)
GFR, Estimated: >60 mL/min (ref >60)
Glucose, Bld: 123 mg/dL — ABNORMAL HIGH (ref 70–99)
Potassium: 4.1 mmol/L (ref 3.5–5.1)
Sodium: 142 mmol/L (ref 135–145)
Total Bilirubin: 0.4 mg/dL (ref 0.3–1.2)
Total Protein: 7.2 g/dL (ref 6.5–8.1)

## 2019-07-12 LAB — TSH: TSH: 0.652 u[IU]/mL (ref 0.320–4.118)

## 2019-07-12 MED ORDER — FAMOTIDINE 20 MG PO TABS: 20.0000 mg | ORAL_TABLET | Freq: Two times a day (BID) | ORAL | 1 refills | Status: DC

## 2019-07-12 MED ORDER — PREDNISONE 20 MG PO TABS
ORAL_TABLET | ORAL | 0 refills | Status: DC
Start: 2019-07-12 — End: 2019-07-16

## 2019-07-12 NOTE — Progress Notes (Signed)
[facility] [phone]   [phone]  OFFICE PROGRESS NOTE  [name], MD [facility] [address] [place] [address]  DIAGNOSIS: Stage IIIc (T4,N3, M0)non-small cell lung cancer, adenocarcinoma presented with large right hilar mass in addition to bilateral hilar and mediastinal lymphadenopathy diagnosed in December 2020.  PRIOR THERAPY: Weekly concurrent chemoradiation with carboplatin for an AUC of 2 and paclitaxel 45 mg/m2. First dose starting 01/30/2019.   Status post 6 cycles.  CURRENT THERAPY: Consolidation immunotherapy with Imfinzi 1500 mg IV every 4 weeks. First dose expected on 04/19/2019.  Status post 3 cycles.  INTERVAL HISTORY: Philip [name] 64 y.o. male returns to the clinic today for follow-up visit.  The patient continues to complain of shortness of breath at baseline increased with exertion.  He was diagnosed with pulmonary embolism few weeks ago and currently on Eliquis.  The patient denied having any chest pain but continues to have dry cough with no hemoptysis.  He denied having any fever or chills.  He has no nausea, vomiting, diarrhea or constipation.  He is a little bit depressed these days because of the shortness of breath.  He denied having any headache or visual changes.  He had repeat CT scan of the chest performed recently and is here for evaluation and discussion of his scan results.  MEDICAL HISTORY: Past Medical History:  Diagnosis Date  . Hyperlipidemia   . lung ca dx'd 12/2018  . Pneumonia   . Tobacco abuse     ALLERGIES:  is allergic to norco [hydrocodone-acetaminophen].  MEDICATIONS:  Current Outpatient Medications  Medication Sig Dispense Refill  . albuterol (PROVENTIL) (2.5 MG/3ML) 0.083% nebulizer solution Take 3 mLs (2.5 mg total) by nebulization every 6 (six) hours as needed for wheezing or shortness of breath. 75 mL 12  . albuterol (VENTOLIN HFA) 108 (90 Base) MCG/ACT inhaler Inhale  2 puffs into the lungs every 4 (four) hours as needed for wheezing or shortness of breath. 18 g 6  . APIXABAN (ELIQUIS) VTE STARTER PACK (10MG  AND 5MG ) Take as directed on package: start with two-5mg  tablets twice daily for 7 days. On day 8, switch to one-5mg  tablet twice daily. 1 each 0  . atorvastatin (LIPITOR) 20 MG tablet Take 20 mg by mouth daily.    . benzonatate (TESSALON) 100 MG capsule Take 1 capsule (100 mg total) by mouth 3 (three) times daily as needed for cough. (Patient not taking: Reported on 07/06/2019) 20 capsule 0  . bismuth subsalicylate (PEPTO BISMOL) 262 MG/15ML suspension Take 30 mLs by mouth every 6 (six) hours as needed for indigestion.    . famotidine (PEPCID) 20 MG tablet Take 20 mg by mouth 2 (two) times daily.    Marland Kitchen lidocaine (LIDODERM) 5 % Place 1 patch onto the skin daily. Remove & Discard patch within 12 hours or as directed by MD 30 patch 0  . Nebulizers (COMPRESSOR/NEBULIZER) MISC 1 application by Does not apply route every 4 (four) hours as needed. 1 each 0  . omeprazole-sodium bicarbonate (ZEGERID) 40-1100 MG capsule Take 1 capsule by mouth daily before breakfast.    . polyethylene glycol (MIRALAX / GLYCOLAX) 17 g packet Take 17 g by mouth daily. (Patient not taking: Reported on 07/06/2019) 14 each 0  . senna-docusate (SENOKOT-S) 8.6-50 MG tablet Take 1 tablet by mouth 2 (two) times daily. (Patient not taking: Reported on 07/06/2019) 60 tablet 0  . sildenafil (REVATIO) 20 MG tablet Take 20-60 mg  by mouth daily as needed (ED).     . sucralfate (CARAFATE) 1 g tablet Take 1 tablet (1 g total) by mouth 4 (four) times daily -  with meals and at bedtime. Dissolve in 10 mL of warm water prior to swallowing (Patient not taking: Reported on 06/16/2019) 120 tablet 1  . Tetrahydrozoline HCl (VISINE OP) Place 1 drop into both eyes daily as needed (redness).    . Tiotropium Bromide Monohydrate (SPIRIVA RESPIMAT) 1.25 MCG/ACT AERS Inhale 1 puff into the lungs daily. 4 g 11   No current  facility-administered medications for this visit.    SURGICAL HISTORY:  Past Surgical History:  Procedure Laterality Date  . COLONOSCOPY    . HAND SURGERY    . VIDEO BRONCHOSCOPY WITH ENDOBRONCHIAL ULTRASOUND N/A 12/22/2018   Procedure: VIDEO BRONCHOSCOPY WITH ENDOBRONCHIAL ULTRASOUND;  Surgeon: Garner Nash, DO;  Location: [facility];  Service: Thoracic;  Laterality: N/A;  . VIDEO BRONCHOSCOPY WITH ENDOBRONCHIAL ULTRASOUND N/A 01/09/2019   Procedure: VIDEO BRONCHOSCOPY WITH ENDOBRONCHIAL ULTRASOUND WITH FLUORO;  Surgeon: Garner Nash, DO;  Location: [facility];  Service: Thoracic;  Laterality: N/A;  . VIDEO BRONCHOSCOPY WITH RADIAL ENDOBRONCHIAL ULTRASOUND N/A 12/22/2018   Procedure: RADIAL ENDOBRONCHIAL ULTRASOUND;  Surgeon: Garner Nash, DO;  Location: [facility];  Service: Thoracic;  Laterality: N/A;    REVIEW OF SYSTEMS:  Constitutional: positive for fatigue Eyes: negative Ears, nose, mouth, throat, and face: negative Respiratory: positive for cough and dyspnea on exertion Cardiovascular: negative Gastrointestinal: negative Genitourinary:negative Integument/breast: negative Hematologic/lymphatic: negative Musculoskeletal:negative Neurological: negative Behavioral/Psych: negative Endocrine: negative Allergic/Immunologic: negative   PHYSICAL EXAMINATION: General appearance: alert, cooperative, fatigued and no distress Head: Normocephalic, without obvious abnormality, atraumatic Neck: no adenopathy, no JVD, supple, symmetrical, trachea midline and thyroid not enlarged, symmetric, no tenderness/mass/nodules Lymph nodes: Cervical, supraclavicular, and axillary nodes normal. Resp: clear to auscultation bilaterally Back: symmetric, no curvature. ROM normal. No CVA tenderness. Cardio: regular rate and rhythm, S1, S2 normal, no murmur, click, rub or gallop GI: soft, non-tender; bowel sounds normal; no masses,  no organomegaly Extremities: extremities normal, atraumatic, no cyanosis or  edema Neurologic: Alert and oriented X 3, normal strength and tone. Normal symmetric reflexes. Normal coordination and gait  ECOG PERFORMANCE STATUS: 1 - Symptomatic but completely ambulatory  Blood pressure 99/75, pulse 86, temperature (!) 97.5 F (36.4 C), temperature source Temporal, resp. rate 17, height 5' 6.5" (1.689 m), weight 182 lb 9.6 oz (82.8 kg), SpO2 97 %.  LABORATORY DATA: Lab Results  Component Value Date   WBC 5.6 07/12/2019   HGB 14.5 07/12/2019   HCT 43.0 07/12/2019   MCV 89.6 07/12/2019   PLT 189 07/12/2019      Chemistry      Component Value Date/Time   NA 135 06/19/2019 0351   K 4.0 06/19/2019 0351   CL 100 06/19/2019 0351   CO2 28 06/19/2019 0351   BUN 12 06/19/2019 0351   CREATININE 0.76 06/19/2019 0351   CREATININE 1.03 06/14/2019 0900      Component Value Date/Time   CALCIUM 8.6 (L) 06/19/2019 0351   ALKPHOS 82 06/18/2019 0545   AST 17 06/18/2019 0545   AST 24 06/14/2019 0900   ALT 23 06/18/2019 0545   ALT 32 06/14/2019 0900   BILITOT 0.5 06/18/2019 0545   BILITOT 0.3 06/14/2019 0900       RADIOGRAPHIC STUDIES: DG Chest 2 View  Result Date: 06/16/2019 CLINICAL DATA:  Chest pain and shortness of breath. Lung cancer patient. EXAM: CHEST -  2 VIEW COMPARISON:  06/15/2019 FINDINGS: Worsening of widespread pulmonary density which could be worsening pneumonia, more likely than edema. No dense consolidation, collapse or effusion. IMPRESSION: Worsening bilateral pulmonary density worrisome for worsening pneumonia. Electronically Signed   By: Nelson Chimes M.D.   On: 06/16/2019 14:55   DG Chest 2 View  Result Date: 06/15/2019 CLINICAL DATA:  Chest pain EXAM: CHEST - 2 VIEW COMPARISON:  CT 04/10/2019 FINDINGS: Asymmetric elevation of the right hemidiaphragm with right basilar volume loss and architectural distortion likely related to the dense right perihilar scarring and masslike opacity as well as more medial lucent changes and distortion seen on  comparison CT. Left perihilar opacity is possibly increased from comparison CT and radiography. Additional streaky opacities in the bases likely reflect atelectasis. No pneumothorax or visible effusion. No acute osseous or soft tissue abnormality. Exaggerated thoracic kyphosis. Degenerative changes are present in the imaged spine and shoulders. IMPRESSION: New asymmetric elevation of the right hemidiaphragm with right basilar volume loss in a region of scarring and architectural distortion seen on comparison CT. Superimposed consolidation or infection this location is not excluded. Increasing left perihilar density is noted as well. Could reflect some consolidative process though developing lesion is not completely excluded. Could consider further evaluation with CT imaging. Electronically Signed   By: Lovena Le M.D.   On: 06/15/2019 22:18   CT Chest W Contrast  Result Date: 07/10/2019 CLINICAL DATA:  Non small cell lung cancer, for staging EXAM: CT CHEST WITH CONTRAST TECHNIQUE: Multidetector CT imaging of the chest was performed during intravenous contrast administration. CONTRAST:  32mL OMNIPAQUE IOHEXOL 300 MG/ML  SOLN COMPARISON:  CTA chest dated 06/16/2019.  CT chest dated 04/10/2019. FINDINGS: Cardiovascular: The heart is normal in size. No pericardial effusion. No evidence of thoracic aortic aneurysm. Mild atherosclerotic calcifications of the aortic arch. Three vessel coronary atherosclerosis. Mediastinum/Nodes: 11 mm short axis AP window node (series 2/image 55), previously 12 mm. 8 mm short axis low right paratracheal node (series 2/image 85), previously 7 mm. 10 mm short axis subcarinal node (series 2/image 31), previously 9 mm. Overall, these are grossly unchanged. Visualized thyroid is notable for a 9 mm right thyroid nodule. Not clinically significant; no follow-up imaging recommended (ref: J Am Coll Radiol. 2015 Feb;12(2): 143-50). Lungs/Pleura: Evaluation of the lung parenchyma is constrained  by respiratory motion. Moderate centrilobular and paraseptal emphysematous changes, upper lung predominant. Subpleural reticulation/fibrosis with traction bronchiectasis and possible honeycombing, lower lobe predominant, right greater than left. This appearance suggests superimposed chronic interstitial lung disease and is mildly progressive. Suspected masslike fibrosis/radiation changes involving the bilateral perihilar/paramediastinal regions (series 7/image 62) and extending to the anterior left upper lobe (series 7/image 83). This appearance is progressive from May 2021 and largely new from March 2021. Additional subpleural opacities in the posterior right middle lobe (series 7/image 34) and superior segment right lower lobe (series 7/image 86) are favored to be related to the same process. Technically speaking, the appearance is mildly atypical, and pneumonitis related to immunotherapy and/or tumor could also account for some of this appearance. Overall, the constellation of findings above decreases sensitivity for identification of residual/viable tumor. Small right pleural effusion, partially loculated, mildly progressive. No pneumothorax. Upper Abdomen: Bilateral low-density adrenal nodules, measuring up to 2.3 cm on the left (series 2/image 134), compatible with benign adrenal adenomas. Musculoskeletal: Degenerative changes of the visualized thoracolumbar spine. IMPRESSION: Progressive perihilar and subpleural opacities in the lungs bilaterally, favoring radiation changes, although pneumonitis related to immunotherapy and/or tumor could  account for some of this appearance. Follow-up CT chest and/or PET-CT is suggested in 3-6 months to assess for evolution. Underlying emphysema with superimposed chronic interstitial lung disease. Small right pleural effusion, partially loculated, mildly progressive. Small mediastinal lymph nodes, unchanged, favored to be reactive. Bilateral adrenal adenomas, benign. Aortic  Atherosclerosis (ICD10-I70.0) and Emphysema (ICD10-J43.9). Electronically Signed   By: [name] M.D.   On: 07/10/2019 09:14   CT Angio Chest PE W and/or Wo Contrast  Result Date: 06/16/2019 CLINICAL DATA:  Shortness of breath and chest pain on the right with abdominal distension and black tarry stools. EXAM: CT ANGIOGRAPHY CHEST CT ABDOMEN AND PELVIS WITH CONTRAST TECHNIQUE: Multidetector CT imaging of the chest was performed using the standard protocol during bolus administration of intravenous contrast. Multiplanar CT image reconstructions and MIPs were obtained to evaluate the vascular anatomy. Multidetector CT imaging of the abdomen and pelvis was performed using the standard protocol during bolus administration of intravenous contrast. CONTRAST:  169mL OMNIPAQUE IOHEXOL 350 MG/ML SOLN COMPARISON:  04/10/2019 FINDINGS: CTA CHEST FINDINGS Cardiovascular: Thoracic aorta demonstrates mild atherosclerotic calcifications without aneurysmal dilatation. No cardiac enlargement is seen. No sizable pericardial effusion is noted. The pulmonary artery shows a normal branching pattern. Singular filling defect is noted in the medial right lower lobe best seen on image number 121 of series 5. This is new from a recent CT of the chest from 04/10/2019. No other definitive pulmonary embolism is seen. Mediastinum/Nodes: Thoracic inlet is within normal limits. The esophagus as visualized is unremarkable. Diffuse fullness of the right hilar region is noted similar to that seen on the recent exam. No significant left-sided hilar adenopathy is noted. The esophagus as visualized is within normal limits. Lungs/Pleura: Small right-sided pleural effusion is noted new from the prior exam. Area of masslike architectural distortion in the right perihilar region and right lower lobe is again identified. Some new patchy infiltrative changes noted in the right upper lobe increased when compared with the prior exam. Some similar  subpleural infiltrate is noted in the left upper lobe anteriorly. Diffuse emphysematous changes are noted. Musculoskeletal: Degenerative changes of the thoracic spine are noted. No acute bony abnormality is noted. Review of the MIP images confirms the above findings. CT ABDOMEN and PELVIS FINDINGS Hepatobiliary: No focal liver abnormality is seen. No gallstones, gallbladder wall thickening, or biliary dilatation. Pancreas: Unremarkable. No pancreatic ductal dilatation or surrounding inflammatory changes. Spleen: Normal in size without focal abnormality. Adrenals/Urinary Tract: Stable thickening of the right adrenal gland is noted. 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 Kidneys demonstrate a normal enhancement pattern. No renal calculi or obstructive changes are noted. Delayed images demonstrate normal excretion of contrast material. The bladder is well distended. Stomach/Bowel: The appendix is within normal limits. No obstructive or inflammatory changes of the colon are seen. The stomach is decompressed. No definitive ulcer is seen within the stomach. Small bowel demonstrates some scattered fluid although no obstructive changes are noted. Vascular/Lymphatic: Aortic atherosclerosis. No enlarged abdominal or pelvic lymph nodes. Reproductive: Prostate is unremarkable. Other: No abdominal wall hernia or abnormality. No abdominopelvic ascites. Musculoskeletal: Mild degenerative changes of lumbar spine are seen. Review of the MIP images confirms the above findings. IMPRESSION: CTA of the chest: Singular pulmonary embolism in the right lower lobe pulmonary artery medially new from the prior exam of 04/10/2019 Acute on chronic infiltrate scattered throughout the right and left lungs primarily within the upper lobes. Stable masslike architectural distortion in the right hilum and  right lower lobe similar to that seen on the prior exam. New small right-sided pleural effusion. CT of the abdomen  and pelvis: No acute abnormality is seen. Electronically Signed   By: Inez Catalina M.D.   On: 06/16/2019 17:07   CT ABDOMEN PELVIS W CONTRAST  Result Date: 06/16/2019 CLINICAL DATA:  Shortness of breath and chest pain on the right with abdominal distension and black tarry stools. EXAM: CT ANGIOGRAPHY CHEST CT ABDOMEN AND PELVIS WITH CONTRAST TECHNIQUE: Multidetector CT imaging of the chest was performed using the standard protocol during bolus administration of intravenous contrast. Multiplanar CT image reconstructions and MIPs were obtained to evaluate the vascular anatomy. Multidetector CT imaging of the abdomen and pelvis was performed using the standard protocol during bolus administration of intravenous contrast. CONTRAST:  178mL OMNIPAQUE IOHEXOL 350 MG/ML SOLN COMPARISON:  04/10/2019 FINDINGS: CTA CHEST FINDINGS Cardiovascular: Thoracic aorta demonstrates mild atherosclerotic calcifications without aneurysmal dilatation. No cardiac enlargement is seen. No sizable pericardial effusion is noted. The pulmonary artery shows a normal branching pattern. Singular filling defect is noted in the medial right lower lobe best seen on image number 121 of series 5. This is new from a recent CT of the chest from 04/10/2019. No other definitive pulmonary embolism is seen. Mediastinum/Nodes: Thoracic inlet is within normal limits. The esophagus as visualized is unremarkable. Diffuse fullness of the right hilar region is noted similar to that seen on the recent exam. No significant left-sided hilar adenopathy is noted. The esophagus as visualized is within normal limits. Lungs/Pleura: Small right-sided pleural effusion is noted new from the prior exam. Area of masslike architectural distortion in the right perihilar region and right lower lobe is again identified. Some new patchy infiltrative changes noted in the right upper lobe increased when compared with the prior exam. Some similar subpleural infiltrate is noted in  the left upper lobe anteriorly. Diffuse emphysematous changes are noted. Musculoskeletal: Degenerative changes of the thoracic spine are noted. No acute bony abnormality is noted. Review of the MIP images confirms the above findings. CT ABDOMEN and PELVIS FINDINGS Hepatobiliary: No focal liver abnormality is seen. No gallstones, gallbladder wall thickening, or biliary dilatation. Pancreas: Unremarkable. No pancreatic ductal dilatation or surrounding inflammatory changes. Spleen: Normal in size without focal abnormality. Adrenals/Urinary Tract: Stable thickening of the right adrenal gland is noted. 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 Kidneys demonstrate a normal enhancement pattern. No renal calculi or obstructive changes are noted. Delayed images demonstrate normal excretion of contrast material. The bladder is well distended. Stomach/Bowel: The appendix is within normal limits. No obstructive or inflammatory changes of the colon are seen. The stomach is decompressed. No definitive ulcer is seen within the stomach. Small bowel demonstrates some scattered fluid although no obstructive changes are noted. Vascular/Lymphatic: Aortic atherosclerosis. No enlarged abdominal or pelvic lymph nodes. Reproductive: Prostate is unremarkable. Other: No abdominal wall hernia or abnormality. No abdominopelvic ascites. Musculoskeletal: Mild degenerative changes of lumbar spine are seen. Review of the MIP images confirms the above findings. IMPRESSION: CTA of the chest: Singular pulmonary embolism in the right lower lobe pulmonary artery medially new from the prior exam of 04/10/2019 Acute on chronic infiltrate scattered throughout the right and left lungs primarily within the upper lobes. Stable masslike architectural distortion in the right hilum and right lower lobe similar to that seen on the prior exam. New small right-sided pleural effusion. CT of the abdomen and pelvis: No acute abnormality is  seen. Electronically Signed  By: Inez Catalina M.D.   On: 06/16/2019 17:07   ECHOCARDIOGRAM COMPLETE  Result Date: 06/17/2019    ECHOCARDIOGRAM REPORT   Patient Name:   Philip [name] Date of Exam: 06/17/2019 Medical Rec #:  [id number]        Height:       66.0 in Accession #:    [id number]       Weight:       187.4 lb Date of Birth:  04/24/1955        BSA:          1.945 m Patient Age:    40 years         BP:           92/68 mmHg Patient Gender: M                HR:           95 bpm. Exam Location:  Inpatient Procedure: 2D Echo Indications:    Pulmonary Embolus I26.99  History:        Patient has no prior history of Echocardiogram examinations.                 Risk Factors:Current Smoker and Dyslipidemia.  Sonographer:    Mikki Santee RDCS (AE) Referring Phys: [id number] [place]  1. Left ventricular ejection fraction, by estimation, is 60 to 65%. The left ventricle has normal function. The left ventricle has no regional wall motion abnormalities. Left ventricular diastolic parameters were normal.  2. Right ventricular systolic function is normal. The right ventricular size is normal.  3. The mitral valve is normal in structure. Trivial mitral valve regurgitation. No evidence of mitral stenosis.  4. The aortic valve is tricuspid. Aortic valve regurgitation is not visualized. Mild aortic valve sclerosis is present, with no evidence of aortic valve stenosis.  5. The inferior vena cava is normal in size with greater than 50% respiratory variability, suggesting right atrial pressure of 3 mmHg. FINDINGS  Left Ventricle: Left ventricular ejection fraction, by estimation, is 60 to 65%. The left ventricle has normal function. The left ventricle has no regional wall motion abnormalities. The left ventricular internal cavity size was normal in size. There is  no left ventricular hypertrophy. Left ventricular diastolic parameters were normal. Right Ventricle: The right ventricular size is normal. No increase  in right ventricular wall thickness. Right ventricular systolic function is normal. Left Atrium: Left atrial size was normal in size. Right Atrium: Right atrial size was normal in size. Pericardium: There is no evidence of pericardial effusion. The pericardial effusion is posterior to the left ventricle. Mitral Valve: The mitral valve is normal in structure. Normal mobility of the mitral valve leaflets. Trivial mitral valve regurgitation. No evidence of mitral valve stenosis. Tricuspid Valve: The tricuspid valve is normal in structure. Tricuspid valve regurgitation is mild . No evidence of tricuspid stenosis. Aortic Valve: The aortic valve is tricuspid. Aortic valve regurgitation is not visualized. Mild aortic valve sclerosis is present, with no evidence of aortic valve stenosis. Pulmonic Valve: The pulmonic valve was normal in structure. Pulmonic valve regurgitation is not visualized. No evidence of pulmonic stenosis. Aorta: The aortic root is normal in size and structure. Venous: The inferior vena cava is normal in size with greater than 50% respiratory variability, suggesting right atrial pressure of 3 mmHg. IAS/Shunts: The interatrial septum was not well visualized.  LEFT VENTRICLE PLAX 2D LVIDd:         5.10 cm  Diastology LVIDs:         3.40 cm  LV e' lateral:   12.30 cm/s LV PW:         1.00 cm  LV E/e' lateral: 3.7 LV IVS:        1.00 cm  LV e' medial:    7.62 cm/s LVOT diam:     2.20 cm  LV E/e' medial:  5.9 LV SV:         68 LV SV Index:   35 LVOT Area:     3.80 cm  RIGHT VENTRICLE RV S prime:     11.50 cm/s TAPSE (M-mode): 1.4 cm LEFT ATRIUM             Index       RIGHT ATRIUM           Index LA diam:        3.20 cm 1.65 cm/m  RA Area:     11.70 cm LA Vol (A2C):   18.7 ml 9.61 ml/m  RA Volume:   17.10 ml  8.79 ml/m LA Vol (A4C):   36.5 ml 18.76 ml/m LA Biplane Vol: 27.9 ml 14.34 ml/m  AORTIC VALVE LVOT Vmax:   92.40 cm/s LVOT Vmean:  60.400 cm/s LVOT VTI:    0.180 m  AORTA Ao Root diam: 3.60 cm  MITRAL VALVE MV Area (PHT): 2.71 cm    SHUNTS MV Decel Time: 280 msec    Systemic VTI:  0.18 m MV E velocity: 44.90 cm/s  Systemic Diam: 2.20 cm MV A velocity: 54.80 cm/s MV E/A ratio:  0.82 Jenkins Rouge MD Electronically signed by Jenkins Rouge MD Signature Date/Time: 06/17/2019/10:38:16 AM    Final    VAS [place] LOWER EXTREMITY VENOUS (DVT)  Result Date: 06/18/2019  Lower Venous DVTStudy Indications: Pulmonary embolism.  Risk Factors: Lung cancer. Comparison Study: none Performing Technologist: June Leap RDMS, RVT  Examination Guidelines: A complete evaluation includes B-mode imaging, spectral Doppler, color Doppler, and power Doppler as needed of all accessible portions of each vessel. Bilateral testing is considered an integral part of a complete examination. Limited examinations for reoccurring indications may be performed as noted. The reflux portion of the exam is performed with the patient in reverse Trendelenburg.  +---------+---------------+---------+-----------+----------+--------------+ RIGHT    CompressibilityPhasicitySpontaneityPropertiesThrombus Aging +---------+---------------+---------+-----------+----------+--------------+ CFV      Full           Yes      Yes                                 +---------+---------------+---------+-----------+----------+--------------+ SFJ      Full                                                        +---------+---------------+---------+-----------+----------+--------------+ FV Prox  Full                                                        +---------+---------------+---------+-----------+----------+--------------+ FV Mid   Full                                                        +---------+---------------+---------+-----------+----------+--------------+  FV DistalFull                                                        +---------+---------------+---------+-----------+----------+--------------+ PFV      Full                                                         +---------+---------------+---------+-----------+----------+--------------+ POP      Full           Yes      Yes                                 +---------+---------------+---------+-----------+----------+--------------+ PTV      Full                                                        +---------+---------------+---------+-----------+----------+--------------+ PERO     Full                                                        +---------+---------------+---------+-----------+----------+--------------+   +---------+---------------+---------+-----------+----------+--------------+ LEFT     CompressibilityPhasicitySpontaneityPropertiesThrombus Aging +---------+---------------+---------+-----------+----------+--------------+ CFV      Full           Yes      Yes                                 +---------+---------------+---------+-----------+----------+--------------+ SFJ      Full                                                        +---------+---------------+---------+-----------+----------+--------------+ FV Prox  Full                                                        +---------+---------------+---------+-----------+----------+--------------+ FV Mid   Full                                                        +---------+---------------+---------+-----------+----------+--------------+ FV DistalFull                                                        +---------+---------------+---------+-----------+----------+--------------+  PFV      Full                                                        +---------+---------------+---------+-----------+----------+--------------+ POP      Full           Yes      Yes                                 +---------+---------------+---------+-----------+----------+--------------+ PTV      Full                                                         +---------+---------------+---------+-----------+----------+--------------+ PERO     Full                                                        +---------+---------------+---------+-----------+----------+--------------+     Summary: BILATERAL: - No evidence of deep vein thrombosis seen in the lower extremities, bilaterally. -No evidence of popliteal cyst, bilaterally.   *See table(s) above for measurements and observations. Electronically signed by Ruta Hinds MD on 06/18/2019 at 8:52:01 AM.    Final     ASSESSMENT AND PLAN: This is a very pleasant 64 years old white male with a stage IIIc non-small cell lung cancer, adenocarcinoma diagnosed in December 2020 The patient completed a course of concurrent chemoradiation with weekly carboplatin and paclitaxel status post 6 cycles and he tolerated his treatment well and has partial response. He is currently undergoing consolidation treatment with immunotherapy with Imfinzi 1500 mg IV every 4 weeks status post 3 cycles. The patient has been tolerating his treatment well except for the worsening dyspnea and cough. He had repeat CT scan of the chest performed recently.  I personally and independently reviewed the scan images and discussed the result and showed the images to the patient today. 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 I recommended for the patient to hold his treatment with immunotherapy for now until improvement of the pneumonitis. For the suspicious immunotherapy induced pneumonitis, I will start the patient on a tapered dose of prednisone starting with 80 mg p.o. daily for 1 week followed by 60 mg p.o. daily for 1 week followed by 40 mg p.o. daily for 1 week followed by 20 mg p.o. daily for 1 week and then we will evaluate the patient before resuming his treatment. The patient agreed to the  current plan. I will also give him refill of Pepcid for GI prophylaxis. He was advised to call immediately if he has any other concerning symptoms in the interval. The patient voices understanding of current disease status and treatment options and is in agreement with the current care plan. All questions were answered. The patient knows to call the clinic with any problems, questions or concerns. We can certainly see the  patient much sooner if necessary.  Disclaimer: This note was dictated with voice recognition software. Similar sounding words can inadvertently be transcribed and may not be corrected upon review.

## 2019-07-15 ENCOUNTER — Other Ambulatory Visit: Payer: Self-pay | Admitting: Internal Medicine

## 2019-07-20 NOTE — Progress Notes (Signed)
Provoked PE; will need AC until cancer is in remission (minimum 3-6 months) or he develops a limiting side effect from [facility]. We can reassess at follow up. Will continue to discuss MAI treatment in the future. Hopefully if he remains on a stable immunotherapy regimen we will be able to do this forthcoming.  [name], DO 07/20/19 9:03 PM [facility]

## 2019-07-21 ENCOUNTER — Other Ambulatory Visit: Payer: Self-pay | Admitting: Medical Oncology

## 2019-07-21 ENCOUNTER — Other Ambulatory Visit: Payer: Self-pay | Admitting: Internal Medicine

## 2019-07-21 DIAGNOSIS — R1310 Dysphagia, unspecified: Secondary | ICD-10-CM

## 2019-07-21 MED ORDER — FAMOTIDINE 20 MG PO TABS: 20.0000 mg | ORAL_TABLET | Freq: Two times a day (BID) | ORAL | 1 refills | Status: DC

## 2019-08-03 ENCOUNTER — Other Ambulatory Visit: Payer: Self-pay | Admitting: Internal Medicine

## 2019-08-03 DIAGNOSIS — R1310 Dysphagia, unspecified: Secondary | ICD-10-CM

## 2019-08-06 NOTE — Progress Notes (Signed)
[facility] OFFICE PROGRESS NOTE  [name], MD [address] [place] [address]  DIAGNOSIS: Stage IIIc (T4,N3, M0)non-small cell lung cancer, adenocarcinoma presented with large right hilar mass in addition to bilateral hilar and mediastinal lymphadenopathy diagnosed in December 2020.  PRIOR THERAPY: Weekly concurrent chemoradiation with carboplatin for an AUC of 2 and paclitaxel 45 mg/m2.Last dose of chemotherapy on 03/06/2019.Status post6cycles.  CURRENT THERAPY: Consolidation immunotherapy with Imfinzi 1500 mg IV every 4 weeks. First dose expected on 04/19/2019. Status post 3 cycles. Treatment on hold after cycle #3 due to suspicious pneumonitis.   INTERVAL HISTORY: Philip [name] 64 y.o. male returns to the clinic today for a follow up visit. The patient is feeling fairly well today. At his last appointment, he had a restaging CT scan which showed suspicious pneumonitis. His treatment was on hold for several weeks while he completed his high dose prednisone taper.  He completed his steroid taper yesterday.  He notes that the steroids had made him break out, feel jittery, and to eat more/gain weight.  Today, he noted that he " thinks breathing got a little better".  He denies any significant dry cough compared to 1 month ago.  He does mention that sometimes he feels like he has congestion from the upper part of his lungs to his ears and that his ears have been "stopped up forever".  He denies any fevers, chills, or weight loss.  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  Patient mentions that he has been having less night sweats recently.  Denies any nausea, vomiting, diarrhea, or constipation. Denies any headache or visual changes.  The patient mentions that he has had vertigo for approximately 15 years and does not believe that it is changed significantly.  He is here for evaluation and consideration of resuming his treatment with  immunotherapy.    MEDICAL HISTORY: Past Medical History:  Diagnosis Date  . Hyperlipidemia   . lung ca dx'd 12/2018  . Pneumonia   . Tobacco abuse     ALLERGIES:  is allergic to norco [hydrocodone-acetaminophen].  MEDICATIONS:  Current Outpatient Medications  Medication Sig Dispense Refill  . albuterol (PROVENTIL) (2.5 MG/3ML) 0.083% nebulizer solution Take 3 mLs (2.5 mg total) by nebulization every 6 (six) hours as needed for wheezing or shortness of breath. 75 mL 12  . albuterol (VENTOLIN HFA) 108 (90 Base) MCG/ACT inhaler Inhale 2 puffs into the lungs every 4 (four) hours as needed for wheezing or shortness of breath. 18 g 6  . APIXABAN (ELIQUIS) VTE STARTER PACK (10MG  AND 5MG ) Take as directed on package: start with two-5mg  tablets twice daily for 7 days. On day 8, switch to one-5mg  tablet twice daily. 1 each 0  . atorvastatin (LIPITOR) 20 MG tablet Take 20 mg by mouth daily.    . benzonatate (TESSALON) 100 MG capsule Take 1 capsule (100 mg total) by mouth 3 (three) times daily as needed for cough. (Patient not taking: Reported on 07/06/2019) 20 capsule 0  . bismuth subsalicylate (PEPTO BISMOL) 262 MG/15ML suspension Take 30 mLs by mouth every 6 (six) hours as needed for indigestion.    . famotidine (PEPCID) 20 MG tablet TAKE 1 TABLET BY MOUTH TWICE A DAY 30 tablet 1  . lidocaine (LIDODERM) 5 % Place 1 patch onto the skin daily. Remove & Discard patch within 12 hours or as directed by MD 30 patch 0  . Nebulizers (COMPRESSOR/NEBULIZER) MISC 1 application by Does  not apply route every 4 (four) hours as needed. 1 each 0  . omeprazole-sodium bicarbonate (ZEGERID) 40-1100 MG capsule Take 1 capsule by mouth daily before breakfast.    . polyethylene glycol (MIRALAX / GLYCOLAX) 17 g packet Take 17 g by mouth daily. (Patient not taking: Reported on 07/06/2019) 14 each 0  . predniSONE (DELTASONE) 20 MG tablet TAKE 4 TABS DAILY X1WEEK, TAKE 3 TABS DAILY X1WEEK, TAKE 2 TABS DAILY X1WEEK, TAKE 1 TAB  X1WEEK 70 tablet 0  . sildenafil (REVATIO) 20 MG tablet Take 20-60 mg by mouth daily as needed (ED).     . sucralfate (CARAFATE) 1 g tablet Take 1 tablet (1 g total) by mouth 4 (four) times daily -  with meals and at bedtime. Dissolve in 10 mL of warm water prior to swallowing (Patient not taking: Reported on 06/16/2019) 120 tablet 1  . Tetrahydrozoline HCl (VISINE OP) Place 1 drop into both eyes daily as needed (redness).    . Tiotropium Bromide Monohydrate (SPIRIVA RESPIMAT) 1.25 MCG/ACT AERS Inhale 1 puff into the lungs daily. 4 g 11   No current facility-administered medications for this visit.    SURGICAL HISTORY:  Past Surgical History:  Procedure Laterality Date  . COLONOSCOPY    . HAND SURGERY    . VIDEO BRONCHOSCOPY WITH ENDOBRONCHIAL ULTRASOUND N/A 12/22/2018   Procedure: VIDEO BRONCHOSCOPY WITH ENDOBRONCHIAL ULTRASOUND;  Surgeon: Garner Nash, DO;  Location: [facility];  Service: Thoracic;  Laterality: N/A;  . VIDEO BRONCHOSCOPY WITH ENDOBRONCHIAL ULTRASOUND N/A 01/09/2019   Procedure: VIDEO BRONCHOSCOPY WITH ENDOBRONCHIAL ULTRASOUND WITH FLUORO;  Surgeon: Garner Nash, DO;  Location: [facility];  Service: Thoracic;  Laterality: N/A;  . VIDEO BRONCHOSCOPY WITH RADIAL ENDOBRONCHIAL ULTRASOUND N/A 12/22/2018   Procedure: RADIAL ENDOBRONCHIAL ULTRASOUND;  Surgeon: Garner Nash, DO;  Location: [facility];  Service: Thoracic;  Laterality: N/A;    REVIEW OF SYSTEMS:   Review of Systems  Constitutional: Negative for appetite change, chills, fatigue, fever and unexpected weight change.  HENT: Negative for mouth sores, nosebleeds, sore throat and trouble swallowing.   Eyes: Negative for eye problems and icterus.  Respiratory:  Positive for dyspnea on exertion (improved from prior). Improved dry cough. Negative for hemoptysis and wheezing.   Cardiovascular: Negative for chest pain and leg swelling.  Gastrointestinal: Negative for abdominal pain, constipation, diarrhea, nausea and vomiting.   Genitourinary: Negative for bladder incontinence, difficulty urinating, dysuria, frequency and hematuria.   Musculoskeletal: Negative for back pain, gait problem, neck pain and neck stiffness.  Skin: Mild rash/acne on his back.  Neurological: Positive for baseline vertigo. Negative for extremity weakness, gait problem, headaches, light-headedness and seizures.  Hematological: Negative for adenopathy. Does not bruise/bleed easily.  Psychiatric/Behavioral: Negative for confusion, depression and sleep disturbance. The patient is not nervous/anxious.     PHYSICAL EXAMINATION:  Blood pressure 109/86, pulse 74, temperature 97.7 F (36.5 C), temperature source Temporal, resp. rate 18, height 5' 6.5" (1.689 m), weight 189 lb 12.8 oz (86.1 kg), SpO2 96 %.  ECOG PERFORMANCE STATUS: 1 - Symptomatic but completely ambulatory  Physical Exam  Constitutional: Oriented to person, place, and time and well-developed, well-nourished, and in no distress.  HENT:  Head: Normocephalic and atraumatic.  Mouth/Throat: Oropharynx is clear and moist. No oropharyngeal exudate.  Eyes: Conjunctivae are normal. Right eye exhibits no discharge. Left eye exhibits no discharge. No scleral icterus.  Neck: Normal range of motion. Neck supple.  Cardiovascular: Normal rate, regular rhythm, normal heart sounds and intact distal pulses.  Pulmonary/Chest: Effort normal and breath sounds normal. No respiratory distress. No wheezes. No rales.  Abdominal: Soft. Bowel sounds are normal. Exhibits no distension and no mass. There is no tenderness.  Musculoskeletal: Normal range of motion. Exhibits no edema.  Lymphadenopathy:    No cervical adenopathy.  Neurological: Alert and oriented to person, place, and time. Exhibits normal muscle tone. Gait normal. Coordination normal.  Skin: Mild scattered papules on his back. Skin is warm and dry. Not diaphoretic. No erythema. No pallor.  Psychiatric: Mood, memory and judgment normal.   Vitals reviewed.  LABORATORY DATA: Lab Results  Component Value Date   WBC 6.3 08/09/2019   HGB 14.5 08/09/2019   HCT 43.5 08/09/2019   MCV 89.9 08/09/2019   PLT 194 08/09/2019      Chemistry      Component Value Date/Time   NA 140 08/09/2019 0839   K 4.5 08/09/2019 0839   CL 107 08/09/2019 0839   CO2 20 (L) 08/09/2019 0839   BUN 19 08/09/2019 0839   CREATININE 0.95 08/09/2019 0839      Component Value Date/Time   CALCIUM 9.1 08/09/2019 0839   ALKPHOS 68 08/09/2019 0839   AST 21 08/09/2019 0839   ALT 42 08/09/2019 0839   BILITOT 0.4 08/09/2019 0839       RADIOGRAPHIC STUDIES:  No results found.   ASSESSMENT/PLAN:  This is a very pleasant 88 year oldCaucasianmale recently diagnosed with a stage IIIc (T4,N3, M0)non-small cell lung cancer, adenocarcinoma presented with large right hilar mass in addition to bilateral hilar and mediastinal lymphadenopathy diagnosed in December 2020.Molecular studiesnegativefor any actionable mutations.  He completed weekly concurrent chemoradiation with carboplatin for an AUC of 2 and paclitaxel 45 mg/m2.He is status post6cycles. His last radiation treatment was on 03/08/2019.   The patient is currently undergoing consolidation immunotherapy with Imfinzi. He is status post 3 cycles. His treatment is on hold due to suspicious pneumonitis. He completed a high dose prednisone taper on 08/08/19.   The patient was seen with Dr. Julien Nordmann. Labs were reviewed. Dr. Julien Nordmann had a lengthy discussion with the patient and gave the patient the option of resuming immunotherapy with Imfinzi today vs. continuing on observation for approximately 3 weeks with a restaging CT scan of the chest to reassess his condition.  The patient has opted to proceed with cycle #4 of immunotherapy as scheduled today.  We will see him back for a follow up in 4 weeks for evaluation before starting cycle #5.   He will continue on Eliquis due to his recent pulmonary  embolism.   The patient was advised to call us immediately if he has any new or worsening respiratory complaints.  The patient was advised to call immediately if he has any concerning symptoms in the interval. The patient voices understanding of current disease status and treatment options and is in agreement with the current care plan. All questions were answered. The patient knows to call the clinic with any problems, questions or concerns. We can certainly see the patient much sooner if necessary  No orders of the defined types were placed in this encounter.    Nylene Inlow L Shaquina Gillham, [name] 08/09/19  ADDENDUM: Hematology/Oncology Attending: I had a face-to-face encounter with the patient today.  I recommended his care plan.  This is a very pleasant 64 years old white male with history of stage IIIc non-small cell lung cancer, adenocarcinoma status post a course of concurrent chemoradiation and he is currently undergoing a course of consolidation immunotherapy  with Imfinzi status post 3 cycles.  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  He was treated with a tapered dose of prednisone over the last 4 weeks.  The patient is feeling much better today with no concerning complaints except for mild shortness of breath and cough. I had a lengthy discussion with the patient today about his current condition and treatment options.  I gave the patient the option of continuous observation and monitoring versus resuming his treatment with immunotherapy with Imfinzi and close monitoring. The patient is feeling much better and he would like to resume his treatment.  He will proceed with cycle #4 today. The patient will come back for follow-up visit in 4 weeks for evaluation before the next cycle of his treatment. He was advised to call immediately if he has any other concerning symptoms or worsening  shortness of breath/cough in the interval. Disclaimer: This note was dictated with voice recognition software. Similar sounding words can inadvertently be transcribed and may be missed upon review. Eilleen Kempf, MD 08/09/19

## 2019-08-09 ENCOUNTER — Inpatient Hospital Stay: Payer: No Typology Code available for payment source

## 2019-08-09 ENCOUNTER — Inpatient Hospital Stay: Payer: No Typology Code available for payment source | Attending: Internal Medicine | Admitting: Physician Assistant

## 2019-08-09 ENCOUNTER — Other Ambulatory Visit: Payer: Self-pay

## 2019-08-09 ENCOUNTER — Encounter: Payer: Self-pay | Admitting: Physician Assistant

## 2019-08-09 VITALS — BP 109/86 | HR 74 | Temp 97.7°F | Resp 18 | Ht 66.5 in | Wt 189.8 lb

## 2019-08-09 DIAGNOSIS — E785 Hyperlipidemia, unspecified: Secondary | ICD-10-CM | POA: Diagnosis not present

## 2019-08-09 DIAGNOSIS — C3411 Malignant neoplasm of upper lobe, right bronchus or lung: Secondary | ICD-10-CM | POA: Diagnosis not present

## 2019-08-09 DIAGNOSIS — Z7901 Long term (current) use of anticoagulants: Secondary | ICD-10-CM | POA: Insufficient documentation

## 2019-08-09 DIAGNOSIS — Z86711 Personal history of pulmonary embolism: Secondary | ICD-10-CM | POA: Insufficient documentation

## 2019-08-09 DIAGNOSIS — Z8701 Personal history of pneumonia (recurrent): Secondary | ICD-10-CM | POA: Insufficient documentation

## 2019-08-09 DIAGNOSIS — Z5112 Encounter for antineoplastic immunotherapy: Secondary | ICD-10-CM | POA: Diagnosis not present

## 2019-08-09 DIAGNOSIS — F17210 Nicotine dependence, cigarettes, uncomplicated: Secondary | ICD-10-CM | POA: Insufficient documentation

## 2019-08-09 DIAGNOSIS — Z7952 Long term (current) use of systemic steroids: Secondary | ICD-10-CM | POA: Diagnosis not present

## 2019-08-09 DIAGNOSIS — C3491 Malignant neoplasm of unspecified part of right bronchus or lung: Secondary | ICD-10-CM

## 2019-08-09 DIAGNOSIS — Z79899 Other long term (current) drug therapy: Secondary | ICD-10-CM | POA: Diagnosis not present

## 2019-08-09 LAB — CBC WITH DIFFERENTIAL (CANCER CENTER ONLY)
Abs Immature Granulocytes: 0.13 10*3/uL — ABNORMAL HIGH (ref 0.00–0.07)
Basophils Absolute: 0 10*3/uL (ref 0.0–0.1)
Basophils Relative: 1 %
Eosinophils Absolute: 0.1 10*3/uL (ref 0.0–0.5)
Eosinophils Relative: 1 %
HCT: 43.5 % (ref 39.0–52.0)
Hemoglobin: 14.5 g/dL (ref 13.0–17.0)
Immature Granulocytes: 2 %
Lymphocytes Relative: 16 %
Lymphs Abs: 1 10*3/uL (ref 0.7–4.0)
MCH: 30 pg (ref 26.0–34.0)
MCHC: 33.3 g/dL (ref 30.0–36.0)
MCV: 89.9 fL (ref 80.0–100.0)
Monocytes Absolute: 0.7 10*3/uL (ref 0.1–1.0)
Monocytes Relative: 11 %
Neutro Abs: 4.3 10*3/uL (ref 1.7–7.7)
Neutrophils Relative %: 69 %
Platelet Count: 194 10*3/uL (ref 150–400)
RBC: 4.84 MIL/uL (ref 4.22–5.81)
RDW: 14.6 % (ref 11.5–15.5)
WBC Count: 6.3 10*3/uL (ref 4.0–10.5)
nRBC: 0 % (ref 0.0–0.2)

## 2019-08-09 LAB — CMP (CANCER CENTER ONLY)
ALT: 42 U/L (ref 0–44)
AST: 21 U/L (ref 15–41)
Albumin: 3.5 g/dL (ref 3.5–5.0)
Alkaline Phosphatase: 68 U/L (ref 38–126)
Anion gap: 13 (ref 5–15)
BUN: 19 mg/dL (ref 8–23)
CO2: 20 mmol/L — ABNORMAL LOW (ref 22–32)
Calcium: 9.1 mg/dL (ref 8.9–10.3)
Chloride: 107 mmol/L (ref 98–111)
Creatinine: 0.95 mg/dL (ref 0.61–1.24)
GFR, Est AFR Am: >60 mL/min (ref >60)
GFR, Estimated: >60 mL/min (ref >60)
Glucose, Bld: 89 mg/dL (ref 70–99)
Potassium: 4.5 mmol/L (ref 3.5–5.1)
Sodium: 140 mmol/L (ref 135–145)
Total Bilirubin: 0.4 mg/dL (ref 0.3–1.2)
Total Protein: 6.7 g/dL (ref 6.5–8.1)

## 2019-08-09 LAB — TSH: TSH: 1.776 u[IU]/mL (ref 0.320–4.118)

## 2019-08-09 MED ORDER — SODIUM CHLORIDE 0.9 % IV SOLN
Freq: Once | INTRAVENOUS | Status: AC
  Filled 2019-08-09: qty 250

## 2019-08-09 MED ORDER — SODIUM CHLORIDE 0.9 % IV SOLN
1500.0000 mg | Freq: Once | INTRAVENOUS | Status: AC
  Administered 2019-08-09: 1500 mg via INTRAVENOUS
  Filled 2019-08-09: qty 30

## 2019-08-09 NOTE — Patient Instructions (Signed)
[facility] Discharge Instructions for Patients Receiving Chemotherapy  Today you received the following chemotherapy agents: Imfinzi.  To help prevent nausea and vomiting after your treatment, we encourage you to take your nausea medication as directed.   If you develop nausea and vomiting that is not controlled by your nausea medication, call the clinic.   BELOW ARE SYMPTOMS THAT SHOULD BE REPORTED IMMEDIATELY:  *FEVER GREATER THAN 100.5 F  *CHILLS WITH OR WITHOUT FEVER  NAUSEA AND VOMITING THAT IS NOT CONTROLLED WITH YOUR NAUSEA MEDICATION  *UNUSUAL SHORTNESS OF BREATH  *UNUSUAL BRUISING OR BLEEDING  TENDERNESS IN MOUTH AND THROAT WITH OR WITHOUT PRESENCE OF ULCERS  *URINARY PROBLEMS  *BOWEL PROBLEMS  UNUSUAL RASH Items with * indicate a potential emergency and should be followed up as soon as possible.  Feel free to call the clinic should you have any questions or concerns. The clinic phone number is [phone].  Please show the CHEMO ALERT CARD at check-in to the Emergency Department and triage nurse.   

## 2019-08-18 ENCOUNTER — Telehealth: Payer: Self-pay | Admitting: Physician Assistant

## 2019-08-18 NOTE — Telephone Encounter (Signed)
Scheduled per 07/07 los, patient has been called and notified. 

## 2019-08-20 ENCOUNTER — Other Ambulatory Visit: Payer: Self-pay | Admitting: Internal Medicine

## 2019-08-20 DIAGNOSIS — R1310 Dysphagia, unspecified: Secondary | ICD-10-CM

## 2019-09-06 ENCOUNTER — Inpatient Hospital Stay: Payer: No Typology Code available for payment source | Attending: Internal Medicine

## 2019-09-06 ENCOUNTER — Other Ambulatory Visit: Payer: Self-pay

## 2019-09-06 ENCOUNTER — Encounter: Payer: Self-pay | Admitting: Internal Medicine

## 2019-09-06 ENCOUNTER — Inpatient Hospital Stay (HOSPITAL_BASED_OUTPATIENT_CLINIC_OR_DEPARTMENT_OTHER): Payer: No Typology Code available for payment source | Admitting: Internal Medicine

## 2019-09-06 ENCOUNTER — Inpatient Hospital Stay: Payer: No Typology Code available for payment source

## 2019-09-06 VITALS — BP 104/78 | HR 85 | Temp 97.9°F | Resp 17 | Ht 66.5 in | Wt 185.2 lb

## 2019-09-06 DIAGNOSIS — Z9221 Personal history of antineoplastic chemotherapy: Secondary | ICD-10-CM | POA: Insufficient documentation

## 2019-09-06 DIAGNOSIS — C3490 Malignant neoplasm of unspecified part of unspecified bronchus or lung: Secondary | ICD-10-CM | POA: Diagnosis not present

## 2019-09-06 DIAGNOSIS — F17210 Nicotine dependence, cigarettes, uncomplicated: Secondary | ICD-10-CM | POA: Diagnosis not present

## 2019-09-06 DIAGNOSIS — C3401 Malignant neoplasm of right main bronchus: Secondary | ICD-10-CM | POA: Insufficient documentation

## 2019-09-06 DIAGNOSIS — Z923 Personal history of irradiation: Secondary | ICD-10-CM | POA: Diagnosis not present

## 2019-09-06 DIAGNOSIS — C3491 Malignant neoplasm of unspecified part of right bronchus or lung: Secondary | ICD-10-CM

## 2019-09-06 DIAGNOSIS — Z8701 Personal history of pneumonia (recurrent): Secondary | ICD-10-CM | POA: Diagnosis not present

## 2019-09-06 DIAGNOSIS — Z7901 Long term (current) use of anticoagulants: Secondary | ICD-10-CM | POA: Diagnosis not present

## 2019-09-06 DIAGNOSIS — Z79899 Other long term (current) drug therapy: Secondary | ICD-10-CM | POA: Diagnosis not present

## 2019-09-06 DIAGNOSIS — Z5112 Encounter for antineoplastic immunotherapy: Secondary | ICD-10-CM | POA: Diagnosis not present

## 2019-09-06 DIAGNOSIS — E785 Hyperlipidemia, unspecified: Secondary | ICD-10-CM | POA: Insufficient documentation

## 2019-09-06 DIAGNOSIS — C778 Secondary and unspecified malignant neoplasm of lymph nodes of multiple regions: Secondary | ICD-10-CM | POA: Insufficient documentation

## 2019-09-06 LAB — CBC WITH DIFFERENTIAL (CANCER CENTER ONLY)
Abs Immature Granulocytes: 0.02 10*3/uL (ref 0.00–0.07)
Basophils Absolute: 0.1 10*3/uL (ref 0.0–0.1)
Basophils Relative: 1 %
Eosinophils Absolute: 0.1 10*3/uL (ref 0.0–0.5)
Eosinophils Relative: 2 %
HCT: 40.1 % (ref 39.0–52.0)
Hemoglobin: 14 g/dL (ref 13.0–17.0)
Immature Granulocytes: 0 %
Lymphocytes Relative: 13 %
Lymphs Abs: 0.7 10*3/uL (ref 0.7–4.0)
MCH: 31.3 pg (ref 26.0–34.0)
MCHC: 34.9 g/dL (ref 30.0–36.0)
MCV: 89.7 fL (ref 80.0–100.0)
Monocytes Absolute: 0.6 10*3/uL (ref 0.1–1.0)
Monocytes Relative: 10 %
Neutro Abs: 4 10*3/uL (ref 1.7–7.7)
Neutrophils Relative %: 74 %
Platelet Count: 234 10*3/uL (ref 150–400)
RBC: 4.47 MIL/uL (ref 4.22–5.81)
RDW: 14.3 % (ref 11.5–15.5)
WBC Count: 5.4 10*3/uL (ref 4.0–10.5)
nRBC: 0 % (ref 0.0–0.2)

## 2019-09-06 LAB — CMP (CANCER CENTER ONLY)
ALT: 20 U/L (ref 0–44)
AST: 18 U/L (ref 15–41)
Albumin: 3.4 g/dL — ABNORMAL LOW (ref 3.5–5.0)
Alkaline Phosphatase: 94 U/L (ref 38–126)
Anion gap: 9 (ref 5–15)
BUN: 12 mg/dL (ref 8–23)
CO2: 21 mmol/L — ABNORMAL LOW (ref 22–32)
Calcium: 9.5 mg/dL (ref 8.9–10.3)
Chloride: 109 mmol/L (ref 98–111)
Creatinine: 0.92 mg/dL (ref 0.61–1.24)
GFR, Est AFR Am: >60 mL/min (ref >60)
GFR, Estimated: >60 mL/min (ref >60)
Glucose, Bld: 107 mg/dL — ABNORMAL HIGH (ref 70–99)
Potassium: 4.5 mmol/L (ref 3.5–5.1)
Sodium: 139 mmol/L (ref 135–145)
Total Bilirubin: 0.5 mg/dL (ref 0.3–1.2)
Total Protein: 6.8 g/dL (ref 6.5–8.1)

## 2019-09-06 LAB — TSH: TSH: 0.718 u[IU]/mL (ref 0.320–4.118)

## 2019-09-06 MED ORDER — SODIUM CHLORIDE 0.9 % IV SOLN
1500.0000 mg | Freq: Once | INTRAVENOUS | Status: AC
  Administered 2019-09-06: 1500 mg via INTRAVENOUS
  Filled 2019-09-06: qty 30

## 2019-09-06 MED ORDER — SODIUM CHLORIDE 0.9 % IV SOLN
Freq: Once | INTRAVENOUS | Status: AC
  Filled 2019-09-06: qty 250

## 2019-09-06 NOTE — Patient Instructions (Signed)
[facility] Discharge Instructions for Patients Receiving Chemotherapy  Today you received the following chemotherapy agents: Imfinzi.  To help prevent nausea and vomiting after your treatment, we encourage you to take your nausea medication as directed.   If you develop nausea and vomiting that is not controlled by your nausea medication, call the clinic.   BELOW ARE SYMPTOMS THAT SHOULD BE REPORTED IMMEDIATELY:  *FEVER GREATER THAN 100.5 F  *CHILLS WITH OR WITHOUT FEVER  NAUSEA AND VOMITING THAT IS NOT CONTROLLED WITH YOUR NAUSEA MEDICATION  *UNUSUAL SHORTNESS OF BREATH  *UNUSUAL BRUISING OR BLEEDING  TENDERNESS IN MOUTH AND THROAT WITH OR WITHOUT PRESENCE OF ULCERS  *URINARY PROBLEMS  *BOWEL PROBLEMS  UNUSUAL RASH Items with * indicate a potential emergency and should be followed up as soon as possible.  Feel free to call the clinic should you have any questions or concerns. The clinic phone number is [phone].  Please show the CHEMO ALERT CARD at check-in to the Emergency Department and triage nurse.   

## 2019-09-06 NOTE — Progress Notes (Signed)
[facility] [phone]   [phone]  OFFICE PROGRESS NOTE  [name], MD [address] [place] [address]  DIAGNOSIS: Stage IIIc (T4,N3, M0)non-small cell lung cancer, adenocarcinoma presented with large right hilar mass in addition to bilateral hilar and mediastinal lymphadenopathy diagnosed in December 2020.  PRIOR THERAPY: Weekly concurrent chemoradiation with carboplatin for an AUC of 2 and paclitaxel 45 mg/m2. First dose starting 01/30/2019.   Status post 6 cycles.  CURRENT THERAPY: Consolidation immunotherapy with Imfinzi 1500 mg IV every 4 weeks. First dose expected on 04/19/2019.  Status post 4 cycles.  INTERVAL HISTORY: Philip [name] 64 y.o. male returns to the clinic today for follow-up visit.  The patient is feeling fine today with no concerning complaints except for persistent cough and shortness of breath with exertion.  He is feeling much better now.  He is requesting refill of benzoate.  He denied having any current chest pain or hemoptysis.  He has no nausea, vomiting, diarrhea or constipation.  He has no headache or visual changes.  He tolerated the last cycle of his treatment with Imfinzi fairly well.  The patient is here today for evaluation before starting cycle #5.  MEDICAL HISTORY: Past Medical History:  Diagnosis Date  . Hyperlipidemia   . lung ca dx'd 12/2018  . Pneumonia   . Tobacco abuse     ALLERGIES:  is allergic to norco [hydrocodone-acetaminophen].  MEDICATIONS:  Current Outpatient Medications  Medication Sig Dispense Refill  . albuterol (PROVENTIL) (2.5 MG/3ML) 0.083% nebulizer solution Take 3 mLs (2.5 mg total) by nebulization every 6 (six) hours as needed for wheezing or shortness of breath. 75 mL 12  . albuterol (VENTOLIN HFA) 108 (90 Base) MCG/ACT inhaler Inhale 2 puffs into the lungs every 4 (four) hours as needed for wheezing or shortness of breath. 18 g 6  . APIXABAN  (ELIQUIS) VTE STARTER PACK (10MG  AND 5MG ) Take as directed on package: start with two-5mg  tablets twice daily for 7 days. On day 8, switch to one-5mg  tablet twice daily. 1 each 0  . atorvastatin (LIPITOR) 20 MG tablet Take 20 mg by mouth daily.    . benzonatate (TESSALON) 100 MG capsule Take 1 capsule (100 mg total) by mouth 3 (three) times daily as needed for cough. (Patient not taking: Reported on 07/06/2019) 20 capsule 0  . bismuth subsalicylate (PEPTO BISMOL) 262 MG/15ML suspension Take 30 mLs by mouth every 6 (six) hours as needed for indigestion.    . famotidine (PEPCID) 20 MG tablet TAKE 1 TABLET BY MOUTH TWICE A DAY 30 tablet 1  . lidocaine (LIDODERM) 5 % Place 1 patch onto the skin daily. Remove & Discard patch within 12 hours or as directed by MD 30 patch 0  . Nebulizers (COMPRESSOR/NEBULIZER) MISC 1 application by Does not apply route every 4 (four) hours as needed. 1 each 0  . omeprazole-sodium bicarbonate (ZEGERID) 40-1100 MG capsule Take 1 capsule by mouth daily before breakfast.    . polyethylene glycol (MIRALAX / GLYCOLAX) 17 g packet Take 17 g by mouth daily. (Patient not taking: Reported on 07/06/2019) 14 each 0  . sildenafil (REVATIO) 20 MG tablet Take 20-60 mg by mouth daily as needed (ED).     . sucralfate (CARAFATE) 1 g tablet Take 1 tablet (1 g total) by mouth 4 (four) times daily -  with meals and at bedtime. Dissolve in 10 mL of warm water prior to swallowing (Patient not  taking: Reported on 06/16/2019) 120 tablet 1  . Tetrahydrozoline HCl (VISINE OP) Place 1 drop into both eyes daily as needed (redness).    . Tiotropium Bromide Monohydrate (SPIRIVA RESPIMAT) 1.25 MCG/ACT AERS Inhale 1 puff into the lungs daily. 4 g 11   No current facility-administered medications for this visit.    SURGICAL HISTORY:  Past Surgical History:  Procedure Laterality Date  . COLONOSCOPY    . HAND SURGERY    . VIDEO BRONCHOSCOPY WITH ENDOBRONCHIAL ULTRASOUND N/A 12/22/2018   Procedure: VIDEO  BRONCHOSCOPY WITH ENDOBRONCHIAL ULTRASOUND;  Surgeon: Garner Nash, DO;  Location: [facility];  Service: Thoracic;  Laterality: N/A;  . VIDEO BRONCHOSCOPY WITH ENDOBRONCHIAL ULTRASOUND N/A 01/09/2019   Procedure: VIDEO BRONCHOSCOPY WITH ENDOBRONCHIAL ULTRASOUND WITH FLUORO;  Surgeon: Garner Nash, DO;  Location: [facility];  Service: Thoracic;  Laterality: N/A;  . VIDEO BRONCHOSCOPY WITH RADIAL ENDOBRONCHIAL ULTRASOUND N/A 12/22/2018   Procedure: RADIAL ENDOBRONCHIAL ULTRASOUND;  Surgeon: Garner Nash, DO;  Location: [facility];  Service: Thoracic;  Laterality: N/A;    REVIEW OF SYSTEMS:  A comprehensive review of systems was negative except for: Respiratory: positive for cough and dyspnea on exertion   PHYSICAL EXAMINATION: General appearance: alert, cooperative, fatigued and no distress Head: Normocephalic, without obvious abnormality, atraumatic Neck: no adenopathy, no JVD, supple, symmetrical, trachea midline and thyroid not enlarged, symmetric, no tenderness/mass/nodules Lymph nodes: Cervical, supraclavicular, and axillary nodes normal. Resp: clear to auscultation bilaterally Back: symmetric, no curvature. ROM normal. No CVA tenderness. Cardio: regular rate and rhythm, S1, S2 normal, no murmur, click, rub or gallop GI: soft, non-tender; bowel sounds normal; no masses,  no organomegaly Extremities: extremities normal, atraumatic, no cyanosis or edema  ECOG PERFORMANCE STATUS: 1 - Symptomatic but completely ambulatory  Blood pressure 104/78, pulse 85, temperature 97.9 F (36.6 C), temperature source Temporal, resp. rate 17, height 5' 6.5" (1.689 m), weight 185 lb 3.2 oz (84 kg), SpO2 97 %.  LABORATORY DATA: Lab Results  Component Value Date   WBC 5.4 09/06/2019   HGB 14.0 09/06/2019   HCT 40.1 09/06/2019   MCV 89.7 09/06/2019   PLT 234 09/06/2019      Chemistry      Component Value Date/Time   NA 140 08/09/2019 0839   K 4.5 08/09/2019 0839   CL 107 08/09/2019 0839   CO2 20 (L)  08/09/2019 0839   BUN 19 08/09/2019 0839   CREATININE 0.95 08/09/2019 0839      Component Value Date/Time   CALCIUM 9.1 08/09/2019 0839   ALKPHOS 68 08/09/2019 0839   AST 21 08/09/2019 0839   ALT 42 08/09/2019 0839   BILITOT 0.4 08/09/2019 0839       RADIOGRAPHIC STUDIES: No results found.  ASSESSMENT AND PLAN: This is a very pleasant 64 years old white male with a stage IIIc non-small cell lung cancer, adenocarcinoma diagnosed in December 2020 The patient completed a course of concurrent chemoradiation with weekly carboplatin and paclitaxel status post 6 cycles and he tolerated his treatment well and has partial response. He is currently undergoing consolidation treatment with immunotherapy with Imfinzi 1500 mg IV every 4 weeks status post 4 cycles. 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 He tolerated the last cycle of his treatment with Imfinzi fairly well.  He continues to have mild cough and shortness of breath. I gave the patient the option of discontinuing his treatment at this point versus proceeding with cycle #5  and close monitoring of his symptoms. The patient would like to continue his treatment as planned. He was advised to call immediately or go to the emergency room if he has worsening of his cough or shortness of breath. I will arrange for the patient to have repeat CT scan of the chest for restaging of his disease before starting cycle #6. For the cough I will give him refill of benzoate. He was advised to call immediately if he has any concerning symptoms in the interval. The patient voices understanding of current disease status and treatment options and is in agreement with the current care plan. All questions were answered. The patient knows to call the clinic with any problems, questions or concerns. We can certainly see the patient much sooner if necessary.  Disclaimer: This note was dictated with  voice recognition software. Similar sounding words can inadvertently be transcribed and may not be corrected upon review.

## 2019-09-11 ENCOUNTER — Other Ambulatory Visit: Payer: Self-pay | Admitting: Internal Medicine

## 2019-09-11 DIAGNOSIS — R1310 Dysphagia, unspecified: Secondary | ICD-10-CM

## 2019-09-20 ENCOUNTER — Other Ambulatory Visit: Payer: Self-pay | Admitting: Internal Medicine

## 2019-09-20 DIAGNOSIS — R1310 Dysphagia, unspecified: Secondary | ICD-10-CM

## 2019-09-30 ENCOUNTER — Other Ambulatory Visit: Payer: Self-pay | Admitting: Internal Medicine

## 2019-09-30 DIAGNOSIS — R1310 Dysphagia, unspecified: Secondary | ICD-10-CM

## 2019-09-30 NOTE — Progress Notes (Signed)
[facility] OFFICE PROGRESS NOTE  [name], MD [address] [place] [address]  DIAGNOSIS: Stage IIIc (T4,N3, M0)non-small cell lung cancer, adenocarcinoma presented with large right hilar mass in addition to bilateral hilar and mediastinal lymphadenopathy diagnosed in December 2020.  PRIOR THERAPY: Weekly concurrent chemoradiation with carboplatin for an AUC of 2 and paclitaxel 45 mg/m2.Last dose of chemotherapy on 03/06/2019.Status post6cycles.  CURRENT THERAPY: Consolidation immunotherapy with Imfinzi1500 mgIV every 4weeks. First dose expected on 04/19/2019. Status post 5 cycles. Treatment on hold after cycle #3 due to suspicious pneumonitis.  INTERVAL HISTORY: Philip [name] 64 y.o. male returns to the clinic today for a follow up visit. The patient is feeling fair today but is endorsing a similar cough and dyspnea on exertion. He states prior to being diagnosed with lung cancer, he had a cough. His cough completed resolved for a time but developed again with immunotherapy in which he was treated with steroids after cycle #3 due to suspicious immunotherapy pneumonitis. His cough has persisted since that time. He states it is about the same compared to last time her received his last cycle of immunotherapy 4 weeks ago. He states, if anything, it is slightly better. He describes that he feels like his cough is stuck in the upper half of his lungs and he cannot bring it out. He describes his sputum as foamy and sticky. He does not use mucinex.  Of note, he does also follow with pulmonology who is monitoring him for a MAI infection.    He was supposed to have a restaging CT scan of his chest prior to his appointment today but there was some delay due to insurance. He denies associated fevers, chills, night sweats, chest pain, or hemoptysis.    It has not shortness of breath and cough?-was treated for suspicious pneumonitis in the past and placed on  steroid taper). His restaging CT scan is not scheduled until tomorrow.   Otherwise, he denies nausea, vomiting, diarrhea, or constipation. Denies headaches or visual changes. He is here for evaluation before consideration of starting cycle #6. Marland Kitchen    MEDICAL HISTORY: Past Medical History:  Diagnosis Date  . Hyperlipidemia   . lung ca dx'd 12/2018  . Pneumonia   . Tobacco abuse     ALLERGIES:  is allergic to norco [hydrocodone-acetaminophen].  MEDICATIONS:  Current Outpatient Medications  Medication Sig Dispense Refill  . albuterol (PROVENTIL) (2.5 MG/3ML) 0.083% nebulizer solution Take 3 mLs (2.5 mg total) by nebulization every 6 (six) hours as needed for wheezing or shortness of breath. 75 mL 12  . albuterol (VENTOLIN HFA) 108 (90 Base) MCG/ACT inhaler Inhale 2 puffs into the lungs every 4 (four) hours as needed for wheezing or shortness of breath. 18 g 6  . atorvastatin (LIPITOR) 20 MG tablet Take 20 mg by mouth daily.    . benzonatate (TESSALON) 100 MG capsule Take 1 capsule (100 mg total) by mouth 3 (three) times daily as needed for cough. (Patient not taking: Reported on 07/06/2019) 20 capsule 0  . bismuth subsalicylate (PEPTO BISMOL) 262 MG/15ML suspension Take 30 mLs by mouth every 6 (six) hours as needed for indigestion.    Marland Kitchen ELIQUIS 5 MG TABS tablet Take 5 mg by mouth 2 (two) times daily.    . famotidine (PEPCID) 20 MG tablet TAKE 1 TABLET BY MOUTH TWICE A DAY 30 tablet 1  . lidocaine (LIDODERM) 5 % Place 1 patch onto the skin daily. Remove & Discard  patch within 12 hours or as directed by MD 30 patch 0  . Nebulizers (COMPRESSOR/NEBULIZER) MISC 1 application by Does not apply route every 4 (four) hours as needed. 1 each 0  . omeprazole-sodium bicarbonate (ZEGERID) 40-1100 MG capsule Take 1 capsule by mouth daily before breakfast.    . polyethylene glycol (MIRALAX / GLYCOLAX) 17 g packet Take 17 g by mouth daily. (Patient not taking: Reported on 07/06/2019) 14 each 0  . sildenafil  (REVATIO) 20 MG tablet Take 20-60 mg by mouth daily as needed (ED).     . sucralfate (CARAFATE) 1 g tablet Take 1 tablet (1 g total) by mouth 4 (four) times daily -  with meals and at bedtime. Dissolve in 10 mL of warm water prior to swallowing (Patient not taking: Reported on 06/16/2019) 120 tablet 1  . Tetrahydrozoline HCl (VISINE OP) Place 1 drop into both eyes daily as needed (redness).    . Tiotropium Bromide Monohydrate (SPIRIVA RESPIMAT) 1.25 MCG/ACT AERS Inhale 1 puff into the lungs daily. 4 g 11   No current facility-administered medications for this visit.   Facility-Administered Medications Ordered in Other Visits  Medication Dose Route Frequency Provider Last Rate Last Admin  . durvalumab (IMFINZI) 1,500 mg in sodium chloride 0.9 % 100 mL chemo infusion  1,500 mg Intravenous Once [name], MD        SURGICAL HISTORY:  Past Surgical History:  Procedure Laterality Date  . COLONOSCOPY    . HAND SURGERY    . VIDEO BRONCHOSCOPY WITH ENDOBRONCHIAL ULTRASOUND N/A 12/22/2018   Procedure: VIDEO BRONCHOSCOPY WITH ENDOBRONCHIAL ULTRASOUND;  Surgeon: Garner Nash, DO;  Location: [facility];  Service: Thoracic;  Laterality: N/A;  . VIDEO BRONCHOSCOPY WITH ENDOBRONCHIAL ULTRASOUND N/A 01/09/2019   Procedure: VIDEO BRONCHOSCOPY WITH ENDOBRONCHIAL ULTRASOUND WITH FLUORO;  Surgeon: Garner Nash, DO;  Location: [facility];  Service: Thoracic;  Laterality: N/A;  . VIDEO BRONCHOSCOPY WITH RADIAL ENDOBRONCHIAL ULTRASOUND N/A 12/22/2018   Procedure: RADIAL ENDOBRONCHIAL ULTRASOUND;  Surgeon: Garner Nash, DO;  Location[facility];  Service: Thoracic;  Laterality: N/A;    REVIEW OF SYSTEMS:   Review of Systems  Constitutional: Negative for appetite change, chills, fatigue, fever and unexpected weight change.  HENT:   Negative for mouth sores, nosebleeds, sore throat and trouble swallowing.   Eyes: Negative for eye problems and icterus.  Respiratory: Negative for cough, hemoptysis, shortness of  breath and wheezing.   Cardiovascular: Negative for chest pain and leg swelling.  Gastrointestinal: Negative for abdominal pain, constipation, diarrhea, nausea and vomiting.  Genitourinary: Negative for bladder incontinence, difficulty urinating, dysuria, frequency and hematuria.   Musculoskeletal: Negative for back pain, gait problem, neck pain and neck stiffness.  Skin: Negative for itching and rash.  Neurological: Negative for dizziness, extremity weakness, gait problem, headaches, light-headedness and seizures.  Hematological: Negative for adenopathy. Does not bruise/bleed easily.  Psychiatric/Behavioral: Negative for confusion, depression and sleep disturbance. The patient is not nervous/anxious.     PHYSICAL EXAMINATION:  Blood pressure 96/73, pulse 69, temperature 97.9 F (36.6 C), temperature source Tympanic, resp. rate 17, height 5' 6.5" (1.689 m), weight 187 lb 11.2 oz (85.1 kg), SpO2 96 %.  ECOG PERFORMANCE STATUS: 1 - Symptomatic but completely ambulatory  Physical Exam  Constitutional: Oriented to person, place, and time and well-developed, well-nourished, and in no distress. No distress.  HENT:  Head: Normocephalic and atraumatic.  Mouth/Throat: Oropharynx is clear and moist. No oropharyngeal exudate.  Eyes: Conjunctivae are normal. Right eye exhibits no  discharge. Left eye exhibits no discharge. No scleral icterus.  Neck: Normal range of motion. Neck supple.  Cardiovascular: Normal rate, regular rhythm, normal heart sounds and intact distal pulses.   Pulmonary/Chest: Effort normal and breath sounds normal. No respiratory distress. No wheezes. No rales.  Abdominal: Soft. Bowel sounds are normal. Exhibits no distension and no mass. There is no tenderness.  Musculoskeletal: Normal range of motion. Exhibits no edema.  Lymphadenopathy:    No cervical adenopathy.  Neurological: Alert and oriented to person, place, and time. Exhibits normal muscle tone. Gait normal. Coordination  normal.  Skin: Skin is warm and dry. No rash noted. Not diaphoretic. No erythema. No pallor.  Psychiatric: Mood, memory and judgment normal.  Vitals reviewed.  LABORATORY DATA: Lab Results  Component Value Date   WBC 4.8 10/04/2019   HGB 14.7 10/04/2019   HCT 43.2 10/04/2019   MCV 91.9 10/04/2019   PLT 217 10/04/2019      Chemistry      Component Value Date/Time   NA 139 10/04/2019 1101   K 4.2 10/04/2019 1101   CL 108 10/04/2019 1101   CO2 24 10/04/2019 1101   BUN 11 10/04/2019 1101   CREATININE 0.81 10/04/2019 1101      Component Value Date/Time   CALCIUM 10.1 10/04/2019 1101   ALKPHOS 85 10/04/2019 1101   AST 15 10/04/2019 1101   ALT 16 10/04/2019 1101   BILITOT 0.5 10/04/2019 1101       RADIOGRAPHIC STUDIES:  No results found.   ASSESSMENT/PLAN:  This is a very pleasant 42 year oldCaucasianmale recently diagnosed with a stage IIIc (T4,N3, M0)non-small cell lung cancer, adenocarcinoma presented with large right hilar mass in addition to bilateral hilar and mediastinal lymphadenopathy diagnosed in December 2020.Molecular studiesnegativefor any actionable mutations.  Hecompletedweekly concurrent chemoradiation with carboplatin for an AUC of 2 and paclitaxel 45 mg/m2.He is status post6cycles. His last radiation treatment was on 03/08/2019.  The patient is currently undergoing consolidation immunotherapy with Imfinzi. He is status post 5 cycles. His treatment is on hold due to suspicious pneumonitis. He completed a high dose prednisone taper on 08/08/19.   The patient's restaging scan is scheduled for 10/06/19.   The patient was seen with Dr. Julien Nordmann. Dr. Julien Nordmann recommends that since his symptoms are stable to slightly better compared to his last visit, to proceed with cycle #6 today as scheduled. If there is any concerning findings on his upcoming CT scan, we will call the patient. If he has evidence of pneumonitis, we will give him a prednisone taper.    He will proceed with cycle #6 today as scheduled. We will see him back for a follow up visit in 4 weeks for evaluation before starting cycle #7, unless his scan shows concerning findings, then we will see him sooner.   He will continue on Eliquis due to his recent pulmonary embolism.   The patient was advised to call immediately if he has any concerning symptoms in the interval. The patient voices understanding of current disease status and treatment options and is in agreement with the current care plan. All questions were answered. The patient knows to call the clinic with any problems, questions or concerns. We can certainly see the patient much sooner if necessary   No orders of the defined types were placed in this encounter.    Anyjah Roundtree L Dannia Snook, [name] 10/04/19  ADDENDUM: Hematology/Oncology Attending: I had a face-to-face encounter with the patient today.  I recommended his care plan.  This  is a very pleasant 64 years old white male diagnosed with stage IIIc non-small cell lung cancer, adenocarcinoma in December 2020 with no actionable mutations.  The patient underwent a course of induction concurrent chemoradiation with weekly carboplatin and paclitaxel with partial response.  He is currently undergoing treatment with immunotherapy with Imfinzi every 4 weeks status post 5 cycles.  He has been tolerating the treatment well except for the persistent cough.  He was treated in the past for suspicious immunotherapy mediated pneumonitis.  He resumed his treatment 4 weeks ago when he did not notice any significant worsening of his cough or shortness of breath during this period. I recommended for the patient to proceed with cycle #6 today as planned.  He will have repeat CT scan of the chest later this week. We will see him back for follow-up visit in 4 weeks for evaluation before the next cycle of his treatment but if the scan showed any concerning findings, will call the patient with  further recommendation. The patient was advised to call immediately if he has any other concerning symptoms in the interval.  Disclaimer: This note was dictated with voice recognition software. Similar sounding words can inadvertently be transcribed and may be missed upon review. Eilleen Kempf, MD 10/04/19

## 2019-10-03 ENCOUNTER — Ambulatory Visit (HOSPITAL_COMMUNITY): Payer: No Typology Code available for payment source

## 2019-10-04 ENCOUNTER — Inpatient Hospital Stay: Payer: No Typology Code available for payment source

## 2019-10-04 ENCOUNTER — Encounter: Payer: Self-pay | Admitting: Physician Assistant

## 2019-10-04 ENCOUNTER — Inpatient Hospital Stay (HOSPITAL_BASED_OUTPATIENT_CLINIC_OR_DEPARTMENT_OTHER): Payer: No Typology Code available for payment source | Admitting: Physician Assistant

## 2019-10-04 ENCOUNTER — Other Ambulatory Visit: Payer: Self-pay

## 2019-10-04 ENCOUNTER — Inpatient Hospital Stay: Payer: No Typology Code available for payment source | Attending: Internal Medicine

## 2019-10-04 VITALS — BP 96/73 | HR 69 | Temp 97.9°F | Resp 17 | Ht 66.5 in | Wt 187.7 lb

## 2019-10-04 DIAGNOSIS — J439 Emphysema, unspecified: Secondary | ICD-10-CM | POA: Diagnosis not present

## 2019-10-04 DIAGNOSIS — D3502 Benign neoplasm of left adrenal gland: Secondary | ICD-10-CM | POA: Insufficient documentation

## 2019-10-04 DIAGNOSIS — E785 Hyperlipidemia, unspecified: Secondary | ICD-10-CM | POA: Insufficient documentation

## 2019-10-04 DIAGNOSIS — D3501 Benign neoplasm of right adrenal gland: Secondary | ICD-10-CM | POA: Insufficient documentation

## 2019-10-04 DIAGNOSIS — Z79899 Other long term (current) drug therapy: Secondary | ICD-10-CM | POA: Insufficient documentation

## 2019-10-04 DIAGNOSIS — C3491 Malignant neoplasm of unspecified part of right bronchus or lung: Secondary | ICD-10-CM

## 2019-10-04 DIAGNOSIS — Z7901 Long term (current) use of anticoagulants: Secondary | ICD-10-CM | POA: Diagnosis not present

## 2019-10-04 DIAGNOSIS — C778 Secondary and unspecified malignant neoplasm of lymph nodes of multiple regions: Secondary | ICD-10-CM | POA: Diagnosis not present

## 2019-10-04 DIAGNOSIS — M47814 Spondylosis without myelopathy or radiculopathy, thoracic region: Secondary | ICD-10-CM | POA: Diagnosis not present

## 2019-10-04 DIAGNOSIS — C3401 Malignant neoplasm of right main bronchus: Secondary | ICD-10-CM | POA: Insufficient documentation

## 2019-10-04 DIAGNOSIS — Z5112 Encounter for antineoplastic immunotherapy: Secondary | ICD-10-CM

## 2019-10-04 DIAGNOSIS — F17210 Nicotine dependence, cigarettes, uncomplicated: Secondary | ICD-10-CM | POA: Insufficient documentation

## 2019-10-04 LAB — CMP (CANCER CENTER ONLY)
ALT: 16 U/L (ref 0–44)
AST: 15 U/L (ref 15–41)
Albumin: 3.5 g/dL (ref 3.5–5.0)
Alkaline Phosphatase: 85 U/L (ref 38–126)
Anion gap: 7 (ref 5–15)
BUN: 11 mg/dL (ref 8–23)
CO2: 24 mmol/L (ref 22–32)
Calcium: 10.1 mg/dL (ref 8.9–10.3)
Chloride: 108 mmol/L (ref 98–111)
Creatinine: 0.81 mg/dL (ref 0.61–1.24)
GFR, Est AFR Am: >60 mL/min (ref >60)
GFR, Estimated: >60 mL/min (ref >60)
Glucose, Bld: 104 mg/dL — ABNORMAL HIGH (ref 70–99)
Potassium: 4.2 mmol/L (ref 3.5–5.1)
Sodium: 139 mmol/L (ref 135–145)
Total Bilirubin: 0.5 mg/dL (ref 0.3–1.2)
Total Protein: 7.1 g/dL (ref 6.5–8.1)

## 2019-10-04 LAB — CBC WITH DIFFERENTIAL (CANCER CENTER ONLY)
Abs Immature Granulocytes: 0.02 10*3/uL (ref 0.00–0.07)
Basophils Absolute: 0 10*3/uL (ref 0.0–0.1)
Basophils Relative: 1 %
Eosinophils Absolute: 0.1 10*3/uL (ref 0.0–0.5)
Eosinophils Relative: 2 %
HCT: 43.2 % (ref 39.0–52.0)
Hemoglobin: 14.7 g/dL (ref 13.0–17.0)
Immature Granulocytes: 0 %
Lymphocytes Relative: 13 %
Lymphs Abs: 0.6 10*3/uL — ABNORMAL LOW (ref 0.7–4.0)
MCH: 31.3 pg (ref 26.0–34.0)
MCHC: 34 g/dL (ref 30.0–36.0)
MCV: 91.9 fL (ref 80.0–100.0)
Monocytes Absolute: 0.6 10*3/uL (ref 0.1–1.0)
Monocytes Relative: 12 %
Neutro Abs: 3.5 10*3/uL (ref 1.7–7.7)
Neutrophils Relative %: 72 %
Platelet Count: 217 10*3/uL (ref 150–400)
RBC: 4.7 MIL/uL (ref 4.22–5.81)
RDW: 13.5 % (ref 11.5–15.5)
WBC Count: 4.8 10*3/uL (ref 4.0–10.5)
nRBC: 0 % (ref 0.0–0.2)

## 2019-10-04 LAB — TSH: TSH: 0.884 u[IU]/mL (ref 0.320–4.118)

## 2019-10-04 MED ORDER — SODIUM CHLORIDE 0.9 % IV SOLN
Freq: Once | INTRAVENOUS | Status: AC
  Filled 2019-10-04: qty 250

## 2019-10-04 MED ORDER — SODIUM CHLORIDE 0.9 % IV SOLN
1500.0000 mg | Freq: Once | INTRAVENOUS | Status: AC
  Administered 2019-10-04: 1500 mg via INTRAVENOUS
  Filled 2019-10-04: qty 30

## 2019-10-04 NOTE — Patient Instructions (Signed)
[facility] Discharge Instructions for Patients Receiving Chemotherapy  Today you received the following chemotherapy agents: Imfinzi.  To help prevent nausea and vomiting after your treatment, we encourage you to take your nausea medication as directed.   If you develop nausea and vomiting that is not controlled by your nausea medication, call the clinic.   BELOW ARE SYMPTOMS THAT SHOULD BE REPORTED IMMEDIATELY:  *FEVER GREATER THAN 100.5 F  *CHILLS WITH OR WITHOUT FEVER  NAUSEA AND VOMITING THAT IS NOT CONTROLLED WITH YOUR NAUSEA MEDICATION  *UNUSUAL SHORTNESS OF BREATH  *UNUSUAL BRUISING OR BLEEDING  TENDERNESS IN MOUTH AND THROAT WITH OR WITHOUT PRESENCE OF ULCERS  *URINARY PROBLEMS  *BOWEL PROBLEMS  UNUSUAL RASH Items with * indicate a potential emergency and should be followed up as soon as possible.  Feel free to call the clinic should you have any questions or concerns. The clinic phone number is [phone].  Please show the CHEMO ALERT CARD at check-in to the Emergency Department and triage nurse.   

## 2019-10-06 ENCOUNTER — Encounter (HOSPITAL_COMMUNITY): Payer: Self-pay

## 2019-10-06 ENCOUNTER — Other Ambulatory Visit: Payer: Self-pay

## 2019-10-06 ENCOUNTER — Ambulatory Visit (HOSPITAL_COMMUNITY)
Admission: RE | Admit: 2019-10-06 | Discharge: 2019-10-06 | Disposition: A | Discharge status: Home / Self Care | Payer: No Typology Code available for payment source | Source: Ambulatory Visit | Attending: Internal Medicine | Admitting: Internal Medicine

## 2019-10-06 DIAGNOSIS — C3490 Malignant neoplasm of unspecified part of unspecified bronchus or lung: Secondary | ICD-10-CM | POA: Insufficient documentation

## 2019-10-06 MED ORDER — IOHEXOL 300 MG/ML  SOLN
75.0000 mL | Freq: Once | INTRAMUSCULAR | Status: AC | PRN
  Administered 2019-10-06: 75 mL via INTRAVENOUS

## 2019-10-09 NOTE — Progress Notes (Signed)
@Patient  ID: Philip [name], male    DOB: 10/25/1955, 64 y.o.   MRN: [id number]  Chief Complaint  Patient presents with  . Follow-up    COPD    Referring provider: [name], MD  HPI:  64 year old male former smoker followed in our office for MAI, respiratory failure  PMH: Adenocarcinoma Smoker/ Smoking History: Former smoker.  Quit February/2021.  22.5-pack-year smoking history. Maintenance: Spiriva Respimat 2.5 Pt of: Dr. Carlis Abbott  10/10/2019  - Visit   64 year old male former smoker followed in our office for MAI, respiratory failure and adenocarcinoma.  Followed by Dr. Carlis Abbott.  Patient was last seen in June/2021.  It was felt at that visit patient would maintain follow-up with oncology, complete follow-up CT chest in June/2021, continue Spiriva, will plan for 6 months of Eliquis treatment status post provoked right lower lobe PE in May/2021 patient was scheduled for 10-month follow-up with our office.  Discussed case with Dr. Carlis Abbott at that time and it was felt that patient will need anticoagulation until cancer is in remission in 1 3 to 6 months or he develops limiting side effects from anticoagulation.  Can reassess at follow-up.  We will continue to discuss MAI treatment in the future.  Hopefully he remains stable on immunotherapy and will be able to do this forthcoming.  Since last being seen patient has continued to have follow-up with oncology.  Last being seen on 10/04/2019 by oncology PA [facility].  Assessment and plan from that note is listed below:  ASSESSMENT/PLAN:  This is a very pleasant 1year oldCaucasianmale recently diagnosed with a stage IIIc (T4,N3, M0)non-small cell lung cancer, adenocarcinoma presented with large right hilar mass in addition to bilateral hilar and mediastinal lymphadenopathy diagnosed in December 2020.Molecular studiesnegativefor any actionable mutations.  Hecompletedweekly concurrent chemoradiation with carboplatin for an AUC of 2 and  paclitaxel 45 mg/m2.He is status post6cycles. His last radiation treatment was on 03/08/2019.  The patient is currently undergoing consolidation immunotherapy with Imfinzi. He is status post 5 cycles. His treatment is on hold due to suspicious pneumonitis. He completed a high dose prednisone taper on7/6/21.  The patient's restaging scan is scheduled for 10/06/19.   The patient was seen with Dr. Julien Nordmann. Dr. Julien Nordmann recommends that since his symptoms are stable to slightly better compared to his last visit, to proceed with cycle #6 today as scheduled. If there is any concerning findings on his upcoming CT scan, we will call the patient. If he has evidence of pneumonitis, we will give him a prednisone taper.   He will proceed with cycle #6 today as scheduled. We will see him back for a follow up visit in 4 weeks for evaluation before starting cycle #7, unless his scan shows concerning findings, then we will see him sooner.   He will continue onEliquisdue to his recent pulmonary embolism.   The patient was advised to call immediately if he has any concerning symptoms in the interval. The patient voices understanding of current disease status and treatment options and is in agreement with the current care plan. All questions were answered. The patient knows to call the clinic with any problems, questions or concerns. We can certainly see the patient much sooner if necessary   No orders of the defined types were placed in this encounter.    [name] Heilingoetter, [name] 10/04/19  Patient mains adherent to his medications.  Remains adherent to Spiriva Respimat as well as Eliquis.  He still is having baseline shortness of breath.  Patient continues to report baseline dyspnea on exertion as well as a cough with clear congestion.  Patient with significant postnasal drip.  He is not taking a daily antihistamine.   Questionaires / Pulmonary Flowsheets:   ACT:  No flowsheet data  found.  MMRC: mMRC Dyspnea Scale mMRC Score  10/10/2019 1    Epworth:  No flowsheet data found.  Tests:   Chest Imaging: CT PET 01/03/2019-severe emphysema, right hilar mass with air bronchograms, medial RLL cystic fibrotic changes.  PET avidity of the inferior portion of the mass and medial fibrotic areas of the lower lobe.  CT chest with contrast 04/10/2018-persistent right mass, unchanged right paraseptal emphysema versus cavitary lesions.  Improved adenopathy.  06/16/2019-CTA chest-Singulair pulmonary embolism right lower lobe pulmonary artery medially new since prior exam, acute on chronic infiltrates scattered through right and left lungs primarily within the upper lobes  07/10/2019-CT chest with contrast-progressive perihilar and subpleural opacities in the lungs bilaterally favoring radiation changes although pneumonitis related to immunotherapy under tumor could account for some of this appearance, follow-up CT chest and/or PET/CT is suggested in 3 to 6 months to assess for evolution, underlying emphysema with superimposed chronic interstitial lung disease, small right pleural effusion  10/06/2019-CT chest with contrast-suggestion of a new 1.1 cm solid nodule in the right lower lobe at the margin of the previously visualized consolidative opacities, equivocal for new pulmonary metastasis, suggest attention on follow-up CT in 3 months, alternatively PET CT could be obtained at this time for further characterization, otherwise stable lungs with extensive patchy consolidative opacities and reticulation of both lungs, severe emphysema with diffuse bronchial wall thickening and saber-sheath trachea, suggesting COPD  12/21/2018-pulmonary function test-FVC 3.07 (76% predicted), postbronchodilator ratio 71, postbronchodilator FEV1 2.28 (75% predicted), no bronchodilator response, TLC 5.35 (86% predicted), DLCO 15.47 (64% predicted)  FENO:  No results found for: NITRICOXIDE  PFT: PFT Results  Latest Ref Rng & Units 12/21/2018  FVC-Pre L 3.07  FVC-Predicted Pre % 76  FVC-Post L 3.23  FVC-Predicted Post % 80  Pre FEV1/FVC % % 67  Post FEV1/FCV % % 71  FEV1-Pre L 2.07  FEV1-Predicted Pre % 68  FEV1-Post L 2.28  DLCO uncorrected ml/min/mmHg 15.47  DLCO UNC% % 64  DLVA Predicted % 79  TLC L 5.35  TLC % Predicted % 86  RV % Predicted % 104    WALK:  No flowsheet data found.  Imaging: CT Chest W Contrast  Result Date: 10/06/2019 CLINICAL DATA:  Non-small cell right lung cancer, stage IIIC, status post concurrent chemoradiation therapy and consolidation immunotherapy. Restaging. Treated with steroids for immunotherapy related pneumonitis. EXAM: CT CHEST WITH CONTRAST TECHNIQUE: Multidetector CT imaging of the chest was performed during intravenous contrast administration. CONTRAST:  65mL OMNIPAQUE IOHEXOL 300 MG/ML  SOLN COMPARISON:  07/10/2019 chest CT. FINDINGS: Cardiovascular: Normal heart size. No significant pericardial effusion/thickening. Left anterior descending and right coronary atherosclerosis. Atherosclerotic nonaneurysmal thoracic aorta. Normal caliber pulmonary arteries. No central pulmonary emboli. Mediastinum/Nodes: Hypodense subcentimeter right thyroid nodule is stable. Not clinically significant; no follow-up imaging recommended (ref: J Am Coll Radiol. 2015 Feb;12(2): 143-50). Unremarkable esophagus. No axillary adenopathy. No pathologically enlarged mediastinal or hilar lymph nodes. Previously described mildly enlarged 1.1 cm AP window node is slightly decreased to 0.9 cm (series 2/image 48). Lungs/Pleura: No pneumothorax. No residual right pleural effusion. No left pleural effusion. Severe centrilobular and paraseptal emphysema with diffuse bronchial wall thickening. Saber sheath trachea. There is a suggestion of a new 1.1 cm solid nodule in  the right lower lobe at the margin of the previously visualized consolidative opacity (series 5/image 85). Extensive patchy  regions of consolidation, reticulation, volume loss and ground-glass opacity throughout both lungs are otherwise not substantially changed, most prominent in the right perihilar lung. No additional new significant pulmonary nodules. Upper abdomen: Stable 1.1 cm right adrenal and 2.3 cm left adrenal low-density nodules compatible with adenomas as seen on prior PET-CT. Musculoskeletal: No aggressive appearing focal osseous lesions. Moderate thoracic spondylosis. IMPRESSION: 1. Suggestion of a new 1.1 cm solid nodule in the right lower lobe at the margin of the previously visualized consolidative opacity, equivocal for a new pulmonary metastasis. Suggest attention on follow-up chest CT in 3 months. Alternatively, PET-CT could be obtained at this time for further characterization. 2. Otherwise stable lungs with extensive patchy consolidative opacities and reticulation in both lungs favoring a combination of post treatment change and immunotherapy related pneumonitis. 3. Previously described mildly enlarged AP window lymph node is slightly decreased. No new thoracic adenopathy. 4. Severe emphysema with diffuse bronchial wall thickening and saber sheath trachea, suggesting COPD. 5. Two-vessel coronary atherosclerosis. 6. Stable bilateral adrenal adenomas. 7. Aortic Atherosclerosis (ICD10-I70.0) and Emphysema (ICD10-J43.9). Electronically Signed   By: Ilona Sorrel M.D.   On: 10/06/2019 16:28    Lab Results:  CBC    Component Value Date/Time   WBC 4.8 10/04/2019 1101   WBC 6.3 06/19/2019 0351   RBC 4.70 10/04/2019 1101   HGB 14.7 10/04/2019 1101   HCT 43.2 10/04/2019 1101   PLT 217 10/04/2019 1101   MCV 91.9 10/04/2019 1101   MCH 31.3 10/04/2019 1101   MCHC 34.0 10/04/2019 1101   RDW 13.5 10/04/2019 1101   LYMPHSABS 0.6 (L) 10/04/2019 1101   MONOABS 0.6 10/04/2019 1101   EOSABS 0.1 10/04/2019 1101   BASOSABS 0.0 10/04/2019 1101    BMET    Component Value Date/Time   NA 139 10/04/2019 1101   K 4.2  10/04/2019 1101   CL 108 10/04/2019 1101   CO2 24 10/04/2019 1101   GLUCOSE 104 (H) 10/04/2019 1101   BUN 11 10/04/2019 1101   CREATININE 0.81 10/04/2019 1101   CALCIUM 10.1 10/04/2019 1101   GFRNONAA >60 10/04/2019 1101   GFRAA >60 10/04/2019 1101    BNP    Component Value Date/Time   BNP 63.5 06/16/2019 1517    ProBNP No results found for: PROBNP  Specialty Problems      Pulmonary Problems   Lung mass   Pneumothorax   Adenocarcinoma of right lung, stage 3 (HCC)   COPD with acute bronchitis (HCC)   Pneumonia   Respiratory failure, acute (HCC)   Allergic rhinitis      Allergies  Allergen Reactions  . Norco [Hydrocodone-Acetaminophen] Other (See Comments)    Made pt feel jittery     Immunization History  Administered Date(s) Administered  . Influenza,inj,Quad PF,6+ Mos 11/08/2018  . PFIZER SARS-COV-2 Vaccination 04/27/2019, 05/11/2019  . Pneumococcal Polysaccharide-23 10/10/2019  . Td 05/18/2014    Past Medical History:  Diagnosis Date  . Hyperlipidemia   . lung ca dx'd 12/2018  . Pneumonia   . Tobacco abuse     Tobacco History: Social History   Tobacco Use  Smoking Status Former Smoker  . Packs/day: 0.50  . Years: 45.00  . Pack years: 22.50  . Types: Cigarettes  . Start date: 07/04/1970  . Quit date: 03/14/2019  . Years since quitting: 0.5  Smokeless Tobacco Former Systems developer  . Types: Chew  Counseling given: Yes   Continue to not smoke  Outpatient Encounter Medications as of 10/10/2019  Medication Sig  . albuterol (VENTOLIN HFA) 108 (90 Base) MCG/ACT inhaler Inhale 2 puffs into the lungs every 4 (four) hours as needed for wheezing or shortness of breath.  Marland Kitchen atorvastatin (LIPITOR) 20 MG tablet Take 20 mg by mouth daily.  Marland Kitchen bismuth subsalicylate (PEPTO BISMOL) 262 MG/15ML suspension Take 30 mLs by mouth every 6 (six) hours as needed for indigestion.  Marland Kitchen ELIQUIS 5 MG TABS tablet Take 5 mg by mouth 2 (two) times daily.  . famotidine (PEPCID) 20 MG  tablet TAKE 1 TABLET BY MOUTH TWICE A DAY  . lidocaine (LIDODERM) 5 % Place 1 patch onto the skin daily. Remove & Discard patch within 12 hours or as directed by MD  . Nebulizers (COMPRESSOR/NEBULIZER) MISC 1 application by Does not apply route every 4 (four) hours as needed.  Marland Kitchen omeprazole-sodium bicarbonate (ZEGERID) 40-1100 MG capsule Take 1 capsule by mouth daily before breakfast.  . polyethylene glycol (MIRALAX / GLYCOLAX) 17 g packet Take 17 g by mouth daily.  . sildenafil (REVATIO) 20 MG tablet Take 20-60 mg by mouth daily as needed (ED).   . sucralfate (CARAFATE) 1 g tablet Take 1 tablet (1 g total) by mouth 4 (four) times daily -  with meals and at bedtime. Dissolve in 10 mL of warm water prior to swallowing  . Tetrahydrozoline HCl (VISINE OP) Place 1 drop into both eyes daily as needed (redness).  . Tiotropium Bromide Monohydrate (SPIRIVA RESPIMAT) 1.25 MCG/ACT AERS Inhale 1 puff into the lungs daily.  Marland Kitchen albuterol (PROVENTIL) (2.5 MG/3ML) 0.083% nebulizer solution Take 3 mLs (2.5 mg total) by nebulization every 6 (six) hours as needed for wheezing or shortness of breath. (Patient not taking: Reported on 10/10/2019)  . [DISCONTINUED] benzonatate (TESSALON) 100 MG capsule Take 1 capsule (100 mg total) by mouth 3 (three) times daily as needed for cough. (Patient not taking: Reported on 10/10/2019)   No facility-administered encounter medications on file as of 10/10/2019.     Review of Systems  Review of Systems  Constitutional: Negative for activity change, chills, fatigue, fever and unexpected weight change.  HENT: Positive for congestion (clear congestion ), postnasal drip and rhinorrhea. Negative for sinus pressure, sinus pain and sore throat.   Eyes: Negative.   Respiratory: Positive for cough and shortness of breath. Negative for wheezing.   Cardiovascular: Negative for chest pain and palpitations.  Gastrointestinal: Negative for constipation, diarrhea, nausea and vomiting.  Endocrine:  Negative.   Genitourinary: Negative.   Musculoskeletal: Negative.   Skin: Negative.   Neurological: Negative for dizziness and headaches.  Psychiatric/Behavioral: Negative.  Negative for dysphoric mood. The patient is not nervous/anxious.   All other systems reviewed and are negative.    Physical Exam  BP 110/70 (BP Location: Left Arm, Cuff Size: Normal)   Pulse 86   Temp 97.6 F (36.4 C) (Oral)   Ht 5\' 7"  (1.702 m)   Wt 190 lb 1.6 oz (86.2 kg)   SpO2 96%   BMI 29.77 kg/m   Wt Readings from Last 5 Encounters:  10/10/19 190 lb 1.6 oz (86.2 kg)  10/04/19 187 lb 11.2 oz (85.1 kg)  09/06/19 185 lb 3.2 oz (84 kg)  08/09/19 189 lb 12.8 oz (86.1 kg)  07/12/19 182 lb 9.6 oz (82.8 kg)    BMI Readings from Last 5 Encounters:  10/10/19 29.77 kg/m  10/04/19 29.84 kg/m  09/06/19 29.44 kg/m  08/09/19  30.18 kg/m  07/12/19 29.03 kg/m     Physical Exam Vitals and nursing note reviewed.  Constitutional:      General: He is not in acute distress.    Appearance: Normal appearance. He is normal weight.  HENT:     Head: Normocephalic and atraumatic.     Right Ear: Hearing, tympanic membrane, ear canal and external ear normal. There is impacted cerumen (75% occluded).     Left Ear: Hearing, tympanic membrane, ear canal and external ear normal. There is impacted cerumen (90% occluded).     Nose: Congestion and rhinorrhea present. No mucosal edema.     Right Turbinates: Not enlarged.     Left Turbinates: Not enlarged.     Mouth/Throat:     Mouth: Mucous membranes are dry.     Pharynx: Oropharynx is clear. No oropharyngeal exudate.  Eyes:     Pupils: Pupils are equal, round, and reactive to light.  Cardiovascular:     Rate and Rhythm: Normal rate and regular rhythm.     Pulses: Normal pulses.     Heart sounds: Normal heart sounds. No murmur heard.   Pulmonary:     Effort: Pulmonary effort is normal.     Breath sounds: Normal breath sounds. No decreased breath sounds, wheezing  or rales.  Musculoskeletal:     Cervical back: Normal range of motion.     Right lower leg: No edema.     Left lower leg: No edema.  Lymphadenopathy:     Cervical: No cervical adenopathy.  Skin:    General: Skin is warm and dry.     Capillary Refill: Capillary refill takes less than 2 seconds.     Findings: No erythema or rash.  Neurological:     General: No focal deficit present.     Mental Status: He is alert and oriented to person, place, and time.     Motor: No weakness.     Coordination: Coordination normal.     Gait: Gait is intact. Gait normal.  Psychiatric:        Mood and Affect: Mood normal.        Behavior: Behavior normal. Behavior is cooperative.        Thought Content: Thought content normal.        Judgment: Judgment normal.       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 Patient unfortunately also with adenocarcinoma, will continue to clinically monitor we will hold off on acute treatment of MAI   History of pulmonary embolus (PE) May/2021 CTA chest shows right lower lobe PE Maintained on Eliquis No previous history of VTE events PE felt to be related to ongoing adenocarcinoma  Plan: We will tentatively plan for 6 months of treatment of Eliquis, patient will also need to be in remission from adenocarcinoma before trialing coming off of Eliquis Continue follow-up with  oncology 21-month follow-up with our office  Healthcare maintenance Plan: Pneumovax 23 today Obtain seasonal flu vaccine in fall/2021  Return in about 3 months (around 01/09/2020), or if symptoms worsen or fail to improve, for Follow up with Dr. Carlis Abbott.   Lauraine Rinne, NP 10/10/2019   This appointment required 34 minutes of patient care (this includes precharting, chart review, review of results, face-to-face care, etc.).

## 2019-10-10 ENCOUNTER — Encounter: Payer: Self-pay | Admitting: Pulmonary Disease

## 2019-10-10 ENCOUNTER — Other Ambulatory Visit: Payer: Self-pay

## 2019-10-10 ENCOUNTER — Ambulatory Visit (INDEPENDENT_AMBULATORY_CARE_PROVIDER_SITE_OTHER): Payer: No Typology Code available for payment source | Admitting: Pulmonary Disease

## 2019-10-10 VITALS — BP 110/70 | HR 86 | Temp 97.6°F | Ht 67.0 in | Wt 190.1 lb

## 2019-10-10 DIAGNOSIS — A310 Pulmonary mycobacterial infection: Secondary | ICD-10-CM | POA: Diagnosis not present

## 2019-10-10 DIAGNOSIS — J309 Allergic rhinitis, unspecified: Secondary | ICD-10-CM

## 2019-10-10 DIAGNOSIS — Z0000 Encounter for general adult medical examination without abnormal findings: Secondary | ICD-10-CM

## 2019-10-10 DIAGNOSIS — Z86711 Personal history of pulmonary embolism: Secondary | ICD-10-CM | POA: Diagnosis not present

## 2019-10-10 DIAGNOSIS — C3491 Malignant neoplasm of unspecified part of right bronchus or lung: Secondary | ICD-10-CM | POA: Diagnosis not present

## 2019-10-10 DIAGNOSIS — J440 Chronic obstructive pulmonary disease with acute lower respiratory infection: Secondary | ICD-10-CM | POA: Diagnosis not present

## 2019-10-10 DIAGNOSIS — J209 Acute bronchitis, unspecified: Secondary | ICD-10-CM

## 2019-10-10 MED ORDER — STIOLTO RESPIMAT 2.5-2.5 MCG/ACT IN AERS: 2.0000 | INHALATION_SPRAY | Freq: Every day | RESPIRATORY_TRACT | 0 refills | Status: DC

## 2019-10-10 NOTE — Assessment & Plan Note (Signed)
Known cavitary disease with MAI colonization Patient unfortunately also with adenocarcinoma, will continue to clinically monitor we will hold off on acute treatment of MAI

## 2019-10-10 NOTE — Assessment & Plan Note (Signed)
Plan: Complete CT imaging follow-up as outlined by oncology Continue follow-up with oncology

## 2019-10-10 NOTE — Assessment & Plan Note (Signed)
May/2021 CTA chest shows right lower lobe PE Maintained on Eliquis No previous history of VTE events PE felt to be related to ongoing adenocarcinoma  Plan: We will tentatively plan for 6 months of treatment of Eliquis, patient will also need to be in remission from adenocarcinoma before trialing coming off of Eliquis Continue follow-up with oncology 93-month follow-up with our office

## 2019-10-10 NOTE — Assessment & Plan Note (Signed)
Plan: Pneumovax 23 today Obtain seasonal flu vaccine in fall/2021

## 2019-10-10 NOTE — Assessment & Plan Note (Signed)
Plan: Trial of Stiolto Respimat Stop Spiriva Respimat 1.25 Pneumovax 23 today Obtain seasonal flu vaccine in fall/2021 Offered pulmonary rehab referral today, patient declined, patient will work on increasing physical exercise on his own

## 2019-10-10 NOTE — Patient Instructions (Addendum)
You were seen today by Lauraine Rinne, NP  for:   1. COPD   Trial of Stiolto Respimat inhaler >>>2 puffs daily >>>Take this no matter what >>>This is not a rescue inhaler  Please contact us when you finish your first sample that we have provided today  STOP: Spiriva Respimat 1.25 when trialing STIOLTO >>> 2 puffs daily >>> Do this every day >>>This is not a rescue inhaler  Only use your albuterol as a rescue medication to be used if you can't catch your breath by resting or doing a relaxed purse lip breathing pattern.  - The less you use it, the better it will work when you need it. - Ok to use up to 2 puffs  every 4 hours if you must but call for immediate appointment if use goes up over your usual need - Don't leave home without it !!  (think of it like the spare tire for your car)   Note your daily symptoms > remember "red flags" for COPD:   >>>Increase in cough >>>increase in sputum production >>>increase in shortness of breath or activity  intolerance.   If you notice these symptoms, please call the office to be seen.    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 History of pulmonary embolus (PE)  Continue Eliquis  4. Adenocarcinoma of right lung, stage 3 (HCC)  Continue follow-ups with oncology  5. Healthcare maintenance  Pneumovax 23 today  We would recommend obtaining the seasonal flu vaccine when available in fall/2021   6. Allergic Rhinitis   Please start taking a daily antihistamine:  >>>choose one of: zyrtec, claritin, allegra, or xyzal  >>>these are over the counter medications  >>>can choose generic option  >>>take daily  >>>this medication helps with allergies, post nasal drip, and cough   Can start fluticasone/Flonase nasal spray 1 spray each nostril as needed for  daily allergies and congestion       Follow Up:    Return in about 3 months (around 01/09/2020), or if symptoms worsen or fail to improve, for Follow up with Dr. Carlis Abbott.   Notification of test results are managed in the following manner: If there are  any recommendations or changes to the  plan of care discussed in office today,  we will contact you and let you know what they are. If you do not hear from [place], then your results are normal and you can view them through your  MyChart account , or a letter will be sent to you. Thank you again for trusting [place] with your care  - Thank you, [facility]    It is flu season:   >>> Best ways to protect herself from the flu: Receive the yearly flu vaccine, practice good hand hygiene washing with soap and also using hand sanitizer when available, eat a nutritious meals, get adequate rest, hydrate appropriately       Please contact the office if your symptoms worsen or you have concerns that you are not improving.   Thank you for choosing [facility] for your healthcare, and for allowing [place] to partner with you on your healthcare journey. I am thankful to be able to provide care to you today.   Wyn Quaker FNP-C   Pneumococcal Vaccine, Polyvalent solution for injection What is this medicine? PNEUMOCOCCAL  VACCINE, POLYVALENT (NEU mo KOK al vak SEEN, pol ee VEY luhnt) is a vaccine to prevent pneumococcus bacteria infection. These bacteria are a major cause of ear infections, Strep throat infections, and serious pneumonia, meningitis, or blood infections worldwide. These vaccines help the body to produce antibodies (protective substances) that help your body defend against these bacteria. This vaccine is recommended for people 23 years of age and older with health problems. It is also recommended for all adults over 65 years old. This vaccine will not treat an infection. This medicine may be used for other purposes; ask your health care  provider or pharmacist if you have questions. COMMON BRAND NAME(S): Pneumovax 23 What should I tell my health care provider before I take this medicine? They need to know if you have any of these conditions:  bleeding problems  bone marrow or organ transplant  cancer, Hodgkin's disease  fever  infection  immune system problems  low platelet count in the blood  seizures  an unusual or allergic reaction to pneumococcal vaccine, diphtheria toxoid, other vaccines, latex, other medicines, foods, dyes, or preservatives  pregnant or trying to get pregnant  breast-feeding How should I use this medicine? This vaccine is for injection into a muscle or under the skin. It is given by a health care professional. A copy of Vaccine Information Statements will be given before each vaccination. Read this sheet carefully each time. The sheet may change frequently. Talk to your pediatrician regarding the use of this medicine in children. While this drug may be prescribed for children as young as 22 years of age for selected conditions, precautions do apply. Overdosage: If you think you have taken too much of this medicine contact a poison control center or emergency room at once. NOTE: This medicine is only for you. Do not share this medicine with others. What if I miss a dose? It is important not to miss your dose. Call your doctor or health care professional if you are unable to keep an appointment. What may interact with this medicine?  medicines for cancer chemotherapy  medicines that suppress your immune function  medicines that treat or prevent blood clots like warfarin, enoxaparin, and dalteparin  steroid medicines like prednisone or cortisone This list may not describe all possible interactions. Give your health care provider a list of all the medicines, herbs, non-prescription drugs, or dietary supplements you use. Also tell them if you smoke, drink alcohol, or use illegal drugs. Some  items may interact with your medicine. What should I watch for while using this medicine? Mild fever and pain should go away in 3 days or less. Report any unusual symptoms to your doctor or health care professional. What side effects may I notice from receiving this medicine? Side effects that you should report to your doctor or health care professional as soon as possible:  allergic reactions like skin rash, itching or hives, swelling of the face, lips, or tongue  breathing problems  confused  fever over 102 degrees F  pain, tingling, numbness in the hands or feet  seizures  unusual bleeding or bruising  unusual muscle weakness Side effects that usually do not require medical attention (report to your doctor or health care professional if they continue or are bothersome):  aches and pains  diarrhea  fever of 102 degrees F or less  headache  irritable  loss of appetite  pain, tender at site where injected  trouble sleeping This list may not describe all possible side  effects. Call your doctor for medical advice about side effects. You may report side effects to FDA at 1-800-FDA-1088. Where should I keep my medicine? This does not apply. This vaccine is given in a clinic, pharmacy, doctor's office, or other health care setting and will not be stored at home. NOTE: This sheet is a summary. It may not cover all possible information. If you have questions about this medicine, talk to your doctor, pharmacist, or health care provider.  2020 Elsevier/Gold Standard (2007-08-26 14:32:37)

## 2019-10-10 NOTE — Addendum Note (Signed)
Addended by: Coralie Keens on: 10/10/2019 04:34 PM   Modules accepted: Orders

## 2019-10-10 NOTE — Assessment & Plan Note (Signed)
Plan: Start a daily antihistamine Can use fluticasone nasal spray

## 2019-10-13 ENCOUNTER — Telehealth: Payer: Self-pay | Admitting: Internal Medicine

## 2019-10-13 NOTE — Telephone Encounter (Signed)
Release: [id number] Faxed medical records to [facility] @ fax [phone].

## 2019-10-17 ENCOUNTER — Other Ambulatory Visit: Payer: Self-pay | Admitting: Internal Medicine

## 2019-10-17 DIAGNOSIS — R1310 Dysphagia, unspecified: Secondary | ICD-10-CM

## 2019-10-24 ENCOUNTER — Other Ambulatory Visit: Payer: Self-pay | Admitting: Critical Care Medicine

## 2019-10-28 ENCOUNTER — Other Ambulatory Visit: Payer: Self-pay | Admitting: Internal Medicine

## 2019-10-28 DIAGNOSIS — R1310 Dysphagia, unspecified: Secondary | ICD-10-CM

## 2019-11-02 ENCOUNTER — Inpatient Hospital Stay: Payer: No Typology Code available for payment source

## 2019-11-02 ENCOUNTER — Other Ambulatory Visit: Payer: No Typology Code available for payment source

## 2019-11-02 ENCOUNTER — Other Ambulatory Visit: Payer: Self-pay

## 2019-11-02 ENCOUNTER — Encounter: Payer: Self-pay | Admitting: Internal Medicine

## 2019-11-02 ENCOUNTER — Inpatient Hospital Stay (HOSPITAL_BASED_OUTPATIENT_CLINIC_OR_DEPARTMENT_OTHER): Payer: No Typology Code available for payment source | Admitting: Internal Medicine

## 2019-11-02 VITALS — BP 103/77 | HR 80 | Temp 94.4°F | Resp 17 | Ht 67.0 in | Wt 185.4 lb

## 2019-11-02 DIAGNOSIS — C3491 Malignant neoplasm of unspecified part of right bronchus or lung: Secondary | ICD-10-CM

## 2019-11-02 DIAGNOSIS — Z5112 Encounter for antineoplastic immunotherapy: Secondary | ICD-10-CM

## 2019-11-02 LAB — CBC WITH DIFFERENTIAL (CANCER CENTER ONLY)
Abs Immature Granulocytes: 0.04 10*3/uL (ref 0.00–0.07)
Basophils Absolute: 0.1 10*3/uL (ref 0.0–0.1)
Basophils Relative: 1 %
Eosinophils Absolute: 0.1 10*3/uL (ref 0.0–0.5)
Eosinophils Relative: 2 %
HCT: 44.3 % (ref 39.0–52.0)
Hemoglobin: 15.3 g/dL (ref 13.0–17.0)
Immature Granulocytes: 1 %
Lymphocytes Relative: 18 %
Lymphs Abs: 0.8 10*3/uL (ref 0.7–4.0)
MCH: 31.5 pg (ref 26.0–34.0)
MCHC: 34.5 g/dL (ref 30.0–36.0)
MCV: 91.3 fL (ref 80.0–100.0)
Monocytes Absolute: 0.5 10*3/uL (ref 0.1–1.0)
Monocytes Relative: 11 %
Neutro Abs: 3.2 10*3/uL (ref 1.7–7.7)
Neutrophils Relative %: 67 %
Platelet Count: 262 10*3/uL (ref 150–400)
RBC: 4.85 MIL/uL (ref 4.22–5.81)
RDW: 12.5 % (ref 11.5–15.5)
WBC Count: 4.7 10*3/uL (ref 4.0–10.5)
nRBC: 0 % (ref 0.0–0.2)

## 2019-11-02 LAB — CMP (CANCER CENTER ONLY)
ALT: 26 U/L (ref 0–44)
AST: 24 U/L (ref 15–41)
Albumin: 3.6 g/dL (ref 3.5–5.0)
Alkaline Phosphatase: 104 U/L (ref 38–126)
Anion gap: 9 (ref 5–15)
BUN: 13 mg/dL (ref 8–23)
CO2: 22 mmol/L (ref 22–32)
Calcium: 9.4 mg/dL (ref 8.9–10.3)
Chloride: 105 mmol/L (ref 98–111)
Creatinine: 0.85 mg/dL (ref 0.61–1.24)
GFR, Est AFR Am: >60 mL/min (ref >60)
GFR, Estimated: >60 mL/min (ref >60)
Glucose, Bld: 97 mg/dL (ref 70–99)
Potassium: 4.3 mmol/L (ref 3.5–5.1)
Sodium: 136 mmol/L (ref 135–145)
Total Bilirubin: 0.5 mg/dL (ref 0.3–1.2)
Total Protein: 7.4 g/dL (ref 6.5–8.1)

## 2019-11-02 LAB — TSH: TSH: 0.921 u[IU]/mL (ref 0.320–4.118)

## 2019-11-02 MED ORDER — SODIUM CHLORIDE 0.9 % IV SOLN
Freq: Once | INTRAVENOUS | Status: AC
  Filled 2019-11-02: qty 250

## 2019-11-02 MED ORDER — SODIUM CHLORIDE 0.9 % IV SOLN
1500.0000 mg | Freq: Once | INTRAVENOUS | Status: AC
  Administered 2019-11-02: 1500 mg via INTRAVENOUS
  Filled 2019-11-02: qty 30

## 2019-11-02 NOTE — Patient Instructions (Signed)
[facility] Discharge Instructions for Patients Receiving Chemotherapy  Today you received the following chemotherapy agents: Imfinzi.  To help prevent nausea and vomiting after your treatment, we encourage you to take your nausea medication as directed.   If you develop nausea and vomiting that is not controlled by your nausea medication, call the clinic.   BELOW ARE SYMPTOMS THAT SHOULD BE REPORTED IMMEDIATELY:  *FEVER GREATER THAN 100.5 F  *CHILLS WITH OR WITHOUT FEVER  NAUSEA AND VOMITING THAT IS NOT CONTROLLED WITH YOUR NAUSEA MEDICATION  *UNUSUAL SHORTNESS OF BREATH  *UNUSUAL BRUISING OR BLEEDING  TENDERNESS IN MOUTH AND THROAT WITH OR WITHOUT PRESENCE OF ULCERS  *URINARY PROBLEMS  *BOWEL PROBLEMS  UNUSUAL RASH Items with * indicate a potential emergency and should be followed up as soon as possible.  Feel free to call the clinic should you have any questions or concerns. The clinic phone number is [phone].  Please show the CHEMO ALERT CARD at check-in to the Emergency Department and triage nurse.   

## 2019-11-02 NOTE — Progress Notes (Signed)
[place] [phone]   [phone]  OFFICE PROGRESS NOTE  [name], MD [address] [place] [address]  DIAGNOSIS: Stage IIIc (T4,N3, M0)non-small cell lung cancer, adenocarcinoma presented with large right hilar mass in addition to bilateral hilar and mediastinal lymphadenopathy diagnosed in December 2020.  PRIOR THERAPY: Weekly concurrent chemoradiation with carboplatin for an AUC of 2 and paclitaxel 45 mg/m2. First dose starting 01/30/2019.   Status post 6 cycles.  CURRENT THERAPY: Consolidation immunotherapy with Imfinzi 1500 mg IV every 4 weeks. First dose expected on 04/19/2019.  Status post 6 cycles.  INTERVAL HISTORY: Philip [name] 64 y.o. male returns to the clinic today for follow-up visit.  The patient is feeling fine today with no concerning complaints except for the baseline shortness of breath and mild cough.  He denied having any chest pain or hemoptysis.  He denied having any nausea, vomiting, diarrhea or constipation.  He has no headache or visual changes.  He has no fever or chills.  He lost few pounds since his last visit.  He continues to tolerate his treatment with Imfinzi fairly well.  He is here today for evaluation before starting cycle #7 of his treatment.   MEDICAL HISTORY: Past Medical History:  Diagnosis Date  . Hyperlipidemia   . lung ca dx'd 12/2018  . Pneumonia   . Tobacco abuse     ALLERGIES:  is allergic to norco [hydrocodone-acetaminophen].  MEDICATIONS:  Current Outpatient Medications  Medication Sig Dispense Refill  . albuterol (PROVENTIL) (2.5 MG/3ML) 0.083% nebulizer solution Take 3 mLs (2.5 mg total) by nebulization every 6 (six) hours as needed for wheezing or shortness of breath. (Patient not taking: Reported on 10/10/2019) 75 mL 12  . albuterol (VENTOLIN HFA) 108 (90 Base) MCG/ACT inhaler Inhale 2 puffs into the lungs every 4 (four) hours as needed for wheezing or  shortness of breath. 18 g 6  . atorvastatin (LIPITOR) 20 MG tablet Take 20 mg by mouth daily.    Marland Kitchen bismuth subsalicylate (PEPTO BISMOL) 262 MG/15ML suspension Take 30 mLs by mouth every 6 (six) hours as needed for indigestion.    Marland Kitchen ELIQUIS 5 MG TABS tablet TAKE 1 TABLET TWICE DAILY 60 tablet 2  . famotidine (PEPCID) 20 MG tablet TAKE 1 TABLET BY MOUTH TWICE A DAY 30 tablet 1  . lidocaine (LIDODERM) 5 % Place 1 patch onto the skin daily. Remove & Discard patch within 12 hours or as directed by MD 30 patch 0  . Nebulizers (COMPRESSOR/NEBULIZER) MISC 1 application by Does not apply route every 4 (four) hours as needed. 1 each 0  . omeprazole-sodium bicarbonate (ZEGERID) 40-1100 MG capsule Take 1 capsule by mouth daily before breakfast.    . polyethylene glycol (MIRALAX / GLYCOLAX) 17 g packet Take 17 g by mouth daily. 14 each 0  . sildenafil (REVATIO) 20 MG tablet Take 20-60 mg by mouth daily as needed (ED).     . sucralfate (CARAFATE) 1 g tablet Take 1 tablet (1 g total) by mouth 4 (four) times daily -  with meals and at bedtime. Dissolve in 10 mL of warm water prior to swallowing 120 tablet 1  . Tetrahydrozoline HCl (VISINE OP) Place 1 drop into both eyes daily as needed (redness).    . Tiotropium Bromide Monohydrate (SPIRIVA RESPIMAT) 1.25 MCG/ACT AERS Inhale 1 puff into the lungs daily. 4 g 11  . Tiotropium Bromide-Olodaterol (STIOLTO RESPIMAT) 2.5-2.5 MCG/ACT AERS Inhale 2  puffs into the lungs daily. 4 g 0   No current facility-administered medications for this visit.    SURGICAL HISTORY:  Past Surgical History:  Procedure Laterality Date  . COLONOSCOPY    . HAND SURGERY    . VIDEO BRONCHOSCOPY WITH ENDOBRONCHIAL ULTRASOUND N/A 12/22/2018   Procedure: VIDEO BRONCHOSCOPY WITH ENDOBRONCHIAL ULTRASOUND;  Surgeon: Garner Nash, DO;  Location: [facility];  Service: Thoracic;  Laterality: N/A;  . VIDEO BRONCHOSCOPY WITH ENDOBRONCHIAL ULTRASOUND N/A 01/09/2019   Procedure: VIDEO BRONCHOSCOPY WITH  ENDOBRONCHIAL ULTRASOUND WITH FLUORO;  Surgeon: Garner Nash, DO;  Location: [facility];  Service: Thoracic;  Laterality: N/A;  . VIDEO BRONCHOSCOPY WITH RADIAL ENDOBRONCHIAL ULTRASOUND N/A 12/22/2018   Procedure: RADIAL ENDOBRONCHIAL ULTRASOUND;  Surgeon: Garner Nash, DO;  Location: [facility];  Service: Thoracic;  Laterality: N/A;    REVIEW OF SYSTEMS:  A comprehensive review of systems was negative except for: Respiratory: positive for cough and dyspnea on exertion   PHYSICAL EXAMINATION: General appearance: alert, cooperative and no distress Head: Normocephalic, without obvious abnormality, atraumatic Neck: no adenopathy, no JVD, supple, symmetrical, trachea midline and thyroid not enlarged, symmetric, no tenderness/mass/nodules Lymph nodes: Cervical, supraclavicular, and axillary nodes normal. Resp: clear to auscultation bilaterally Back: symmetric, no curvature. ROM normal. No CVA tenderness. Cardio: regular rate and rhythm, S1, S2 normal, no murmur, click, rub or gallop GI: soft, non-tender; bowel sounds normal; no masses,  no organomegaly Extremities: extremities normal, atraumatic, no cyanosis or edema  ECOG PERFORMANCE STATUS: 1 - Symptomatic but completely ambulatory  Blood pressure 103/77, pulse 80, temperature (!) 94.4 F (34.7 C), temperature source Tympanic, resp. rate 17, height 5\' 7"  (1.702 m), weight 185 lb 6.4 oz (84.1 kg), SpO2 98 %.  LABORATORY DATA: Lab Results  Component Value Date   WBC 4.8 10/04/2019   HGB 14.7 10/04/2019   HCT 43.2 10/04/2019   MCV 91.9 10/04/2019   PLT 217 10/04/2019      Chemistry      Component Value Date/Time   NA 139 10/04/2019 1101   K 4.2 10/04/2019 1101   CL 108 10/04/2019 1101   CO2 24 10/04/2019 1101   BUN 11 10/04/2019 1101   CREATININE 0.81 10/04/2019 1101      Component Value Date/Time   CALCIUM 10.1 10/04/2019 1101   ALKPHOS 85 10/04/2019 1101   AST 15 10/04/2019 1101   ALT 16 10/04/2019 1101   BILITOT 0.5  10/04/2019 1101       RADIOGRAPHIC STUDIES: CT Chest W Contrast  Result Date: 10/06/2019 CLINICAL DATA:  Non-small cell right lung cancer, stage IIIC, status post concurrent chemoradiation therapy and consolidation immunotherapy. Restaging. Treated with steroids for immunotherapy related pneumonitis. EXAM: CT CHEST WITH CONTRAST TECHNIQUE: Multidetector CT imaging of the chest was performed during intravenous contrast administration. CONTRAST:  41mL OMNIPAQUE IOHEXOL 300 MG/ML  SOLN COMPARISON:  07/10/2019 chest CT. FINDINGS: Cardiovascular: Normal heart size. No significant pericardial effusion/thickening. Left anterior descending and right coronary atherosclerosis. Atherosclerotic nonaneurysmal thoracic aorta. Normal caliber pulmonary arteries. No central pulmonary emboli. Mediastinum/Nodes: Hypodense subcentimeter right thyroid nodule is stable. Not clinically significant; no follow-up imaging recommended (ref: J Am Coll Radiol. 2015 Feb;12(2): 143-50). Unremarkable esophagus. No axillary adenopathy. No pathologically enlarged mediastinal or hilar lymph nodes. Previously described mildly enlarged 1.1 cm AP window node is slightly decreased to 0.9 cm (series 2/image 48). Lungs/Pleura: No pneumothorax. No residual right pleural effusion. No left pleural effusion. Severe centrilobular and paraseptal emphysema with diffuse bronchial wall thickening. Saber sheath  trachea. There is a suggestion of a new 1.1 cm solid nodule in the right lower lobe at the margin of the previously visualized consolidative opacity (series 5/image 85). Extensive patchy regions of consolidation, reticulation, volume loss and ground-glass opacity throughout both lungs are otherwise not substantially changed, most prominent in the right perihilar lung. No additional new significant pulmonary nodules. Upper abdomen: Stable 1.1 cm right adrenal and 2.3 cm left adrenal low-density nodules compatible with adenomas as seen on prior PET-CT.  Musculoskeletal: No aggressive appearing focal osseous lesions. Moderate thoracic spondylosis. IMPRESSION: 1. Suggestion of a new 1.1 cm solid nodule in the right lower lobe at the margin of the previously visualized consolidative opacity, equivocal for a new pulmonary metastasis. Suggest attention on follow-up chest CT in 3 months. Alternatively, PET-CT could be obtained at this time for further characterization. 2. Otherwise stable lungs with extensive patchy consolidative opacities and reticulation in both lungs favoring a combination of post treatment change and immunotherapy related pneumonitis. 3. Previously described mildly enlarged AP window lymph node is slightly decreased. No new thoracic adenopathy. 4. Severe emphysema with diffuse bronchial wall thickening and saber sheath trachea, suggesting COPD. 5. Two-vessel coronary atherosclerosis. 6. Stable bilateral adrenal adenomas. 7. Aortic Atherosclerosis (ICD10-I70.0) and Emphysema (ICD10-J43.9). Electronically Signed   By: Ilona Sorrel M.D.   On: 10/06/2019 16:28    ASSESSMENT AND PLAN: This is a very pleasant 64 years old white male with a stage IIIc non-small cell lung cancer, adenocarcinoma diagnosed in December 2020 The patient completed a course of concurrent chemoradiation with weekly carboplatin and paclitaxel status post 6 cycles and he tolerated his treatment well and has partial response. He is currently undergoing consolidation treatment with immunotherapy with Imfinzi 1500 mg IV every 4 weeks status post 6 cycles. The patient continues to tolerate his treatment well with no concerning adverse effects. I recommended for him to proceed with cycle #7 today as planned. For the cough, he is currently on Mucinex. The patient will come back for follow-up visit in 4 weeks for evaluation before the next cycle of his treatment. He was advised to call immediately if he has any concerning symptoms in the interval. The patient voices understanding  of current disease status and treatment options and is in agreement with the current care plan. All questions were answered. The patient knows to call the clinic with any problems, questions or concerns. We can certainly see the patient much sooner if necessary.  Disclaimer: This note was dictated with voice recognition software. Similar sounding words can inadvertently be transcribed and may not be corrected upon review.

## 2019-11-07 ENCOUNTER — Other Ambulatory Visit: Payer: Self-pay | Admitting: Internal Medicine

## 2019-11-07 DIAGNOSIS — R1310 Dysphagia, unspecified: Secondary | ICD-10-CM

## 2019-11-14 ENCOUNTER — Telehealth: Payer: Self-pay | Admitting: Internal Medicine

## 2019-11-14 ENCOUNTER — Telehealth: Payer: Self-pay | Admitting: *Deleted

## 2019-11-14 NOTE — Telephone Encounter (Signed)
Scheduled appts per sch msg. Called and spoke with patient. Confirmed appt  

## 2019-11-14 NOTE — Telephone Encounter (Signed)
I received a call from Philip [name].  He states his follow up appt and infusion have not been scheduled. I contacted scheduling team with an update.  I called Philip [name] back that he will get a call from scheduling with an update on appt.

## 2019-11-15 ENCOUNTER — Telehealth: Payer: Self-pay | Admitting: Internal Medicine

## 2019-11-15 NOTE — Telephone Encounter (Signed)
Called and spoke with patient about decoupling appts at the end of the month. Patient will be out of town on 10/28 for work. Decoupled appts for 10/27 and 10/29. Patient confirmed

## 2019-11-18 ENCOUNTER — Other Ambulatory Visit: Payer: Self-pay | Admitting: Internal Medicine

## 2019-11-18 DIAGNOSIS — R1310 Dysphagia, unspecified: Secondary | ICD-10-CM

## 2019-11-24 NOTE — Progress Notes (Signed)
[facility] OFFICE PROGRESS NOTE  [name], MD [address] [place] [address]  DIAGNOSIS: Stage IIIc (T4,N3, M0)non-small cell lung cancer, adenocarcinoma presented with large right hilar mass in addition to bilateral hilar and mediastinal lymphadenopathy diagnosed in December 2020.  PRIOR THERAPY: Weekly concurrent chemoradiation with carboplatin for an AUC of 2 and paclitaxel 45 mg/m2.Last dose of chemotherapy on 03/06/2019.Status post6cycles.  CURRENT THERAPY: Consolidation immunotherapy with Imfinzi1500 mgIV every 4weeks. First dose expected on 04/19/2019. Status post 7 cycles. Treatment on hold after cycle #3 due to suspicious pneumonitis.  INTERVAL HISTORY: Philip [name] 64 y.o. male returns to the clinic today for a follow up visit. The patient is feeling fair today without any major changes in his health. He did start smoking about 1 pack of cigarettes per week.  Today he denies any fever, chills, night sweats, or weight loss.  He reports that his cough is similar.  He endorses postnasal drainage which he contributes to his cough he has been trying to take Zyrtec as well as Mucinex for his nasal congestion.  He reports his baseline dyspnea for which she uses supplemental oxygen 2.5 L at nighttime.  He denies any chest pain or hemoptysis.  He denies any nausea, vomiting, diarrhea, or constipation.  He denies any headache or visual changes.  He denies any rashes or skin changes. He is here for evaluation before consideration of starting cycle #8    MEDICAL HISTORY: Past Medical History:  Diagnosis Date  . Hyperlipidemia   . lung ca dx'd 12/2018  . Pneumonia   . Tobacco abuse     ALLERGIES:  is allergic to norco [hydrocodone-acetaminophen].  MEDICATIONS:  Current Outpatient Medications  Medication Sig Dispense Refill  . albuterol (PROVENTIL) (2.5 MG/3ML) 0.083% nebulizer solution Take 3 mLs (2.5 mg total) by nebulization every 6  (six) hours as needed for wheezing or shortness of breath. (Patient not taking: Reported on 11/02/2019) 75 mL 12  . albuterol (VENTOLIN HFA) 108 (90 Base) MCG/ACT inhaler Inhale 2 puffs into the lungs every 4 (four) hours as needed for wheezing or shortness of breath. 18 g 6  . atorvastatin (LIPITOR) 20 MG tablet Take 20 mg by mouth daily.    Marland Kitchen bismuth subsalicylate (PEPTO BISMOL) 262 MG/15ML suspension Take 30 mLs by mouth every 6 (six) hours as needed for indigestion.    Marland Kitchen ELIQUIS 5 MG TABS tablet TAKE 1 TABLET TWICE DAILY 60 tablet 2  . famotidine (PEPCID) 20 MG tablet TAKE 1 TABLET BY MOUTH TWICE A DAY 30 tablet 1  . lidocaine (LIDODERM) 5 % Place 1 patch onto the skin daily. Remove & Discard patch within 12 hours or as directed by MD 30 patch 0  . Nebulizers (COMPRESSOR/NEBULIZER) MISC 1 application by Does not apply route every 4 (four) hours as needed. 1 each 0  . omeprazole-sodium bicarbonate (ZEGERID) 40-1100 MG capsule Take 1 capsule by mouth daily before breakfast.    . polyethylene glycol (MIRALAX / GLYCOLAX) 17 g packet Take 17 g by mouth daily. 14 each 0  . sildenafil (REVATIO) 20 MG tablet Take 20-60 mg by mouth daily as needed (ED).     . sucralfate (CARAFATE) 1 g tablet Take 1 tablet (1 g total) by mouth 4 (four) times daily -  with meals and at bedtime. Dissolve in 10 mL of warm water prior to swallowing 120 tablet 1  . Tetrahydrozoline HCl (VISINE OP) Place 1 drop into both eyes daily as  needed (redness).    . Tiotropium Bromide Monohydrate (SPIRIVA RESPIMAT) 1.25 MCG/ACT AERS Inhale 1 puff into the lungs daily. 4 g 11  . Tiotropium Bromide-Olodaterol (STIOLTO RESPIMAT) 2.5-2.5 MCG/ACT AERS Inhale 2 puffs into the lungs daily. 4 g 0   No current facility-administered medications for this visit.    SURGICAL HISTORY:  Past Surgical History:  Procedure Laterality Date  . COLONOSCOPY    . HAND SURGERY    . VIDEO BRONCHOSCOPY WITH ENDOBRONCHIAL ULTRASOUND N/A 12/22/2018    Procedure: VIDEO BRONCHOSCOPY WITH ENDOBRONCHIAL ULTRASOUND;  Surgeon: Garner Nash, DO;  Location: [facility];  Service: Thoracic;  Laterality: N/A;  . VIDEO BRONCHOSCOPY WITH ENDOBRONCHIAL ULTRASOUND N/A 01/09/2019   Procedure: VIDEO BRONCHOSCOPY WITH ENDOBRONCHIAL ULTRASOUND WITH FLUORO;  Surgeon: Garner Nash, DO;  Location: [facility];  Service: Thoracic;  Laterality: N/A;  . VIDEO BRONCHOSCOPY WITH RADIAL ENDOBRONCHIAL ULTRASOUND N/A 12/22/2018   Procedure: RADIAL ENDOBRONCHIAL ULTRASOUND;  Surgeon: Garner Nash, DO;  Location: [facility];  Service: Thoracic;  Laterality: N/A;    REVIEW OF SYSTEMS:   Review of Systems  Constitutional: Negative for appetite change, chills, fatigue, fever and unexpected weight change.  HENT: Positive for post nasal drainage. Negative for mouth sores, nosebleeds, sore throat and trouble swallowing.   Eyes: Negative for eye problems and icterus.  Respiratory: Positive for cough and dyspnea on exertion. Negative for hemoptysis and wheezing.   Cardiovascular: Negative for chest pain and leg swelling.  Gastrointestinal: Negative for abdominal pain, constipation, diarrhea, nausea and vomiting.  Genitourinary: Negative for bladder incontinence, difficulty urinating, dysuria, frequency and hematuria.   Musculoskeletal: Negative for back pain, gait problem, neck pain and neck stiffness.  Skin: Negative for itching and rash.  Neurological: Negative for dizziness, extremity weakness, gait problem, headaches, light-headedness and seizures.  Hematological: Negative for adenopathy. Does not bruise/bleed easily.  Psychiatric/Behavioral: Negative for confusion, depression and sleep disturbance. The patient is not nervous/anxious.     PHYSICAL EXAMINATION:  There were no vitals taken for this visit.  ECOG PERFORMANCE STATUS: 1 - Symptomatic but completely ambulatory  Physical Exam  Constitutional: Oriented to person, place, and time and well-developed, well-nourished,  and in no distress.  HENT:  Head: Normocephalic and atraumatic.  Mouth/Throat: Oropharynx is clear and moist. No oropharyngeal exudate.  Eyes: Conjunctivae are normal. Right eye exhibits no discharge. Left eye exhibits no discharge. No scleral icterus.  Neck: Normal range of motion. Neck supple.  Cardiovascular: Normal rate, regular rhythm, normal heart sounds and intact distal pulses.   Pulmonary/Chest: Effort normal and breath sounds normal. No respiratory distress. No wheezes. No rales.  Abdominal: Soft. Bowel sounds are normal. Exhibits no distension and no mass. There is no tenderness.  Musculoskeletal: Normal range of motion. Exhibits no edema.  Lymphadenopathy:    No cervical adenopathy.  Neurological: Alert and oriented to person, place, and time. Exhibits normal muscle tone. Gait normal. Coordination normal.  Skin: Skin is warm and dry. No rash noted. Not diaphoretic. No erythema. No pallor.  Psychiatric: Mood, memory and judgment normal.  Vitals reviewed.  LABORATORY DATA: Lab Results  Component Value Date   WBC 4.7 11/02/2019   HGB 15.3 11/02/2019   HCT 44.3 11/02/2019   MCV 91.3 11/02/2019   PLT 262 11/02/2019      Chemistry      Component Value Date/Time   NA 136 11/02/2019 1001   K 4.3 11/02/2019 1001   CL 105 11/02/2019 1001   CO2 22 11/02/2019 1001   BUN  13 11/02/2019 1001   CREATININE 0.85 11/02/2019 1001      Component Value Date/Time   CALCIUM 9.4 11/02/2019 1001   ALKPHOS 104 11/02/2019 1001   AST 24 11/02/2019 1001   ALT 26 11/02/2019 1001   BILITOT 0.5 11/02/2019 1001       RADIOGRAPHIC STUDIES:  No results found.   ASSESSMENT/PLAN:  This is a very pleasant 42year oldCaucasianmale recently diagnosed with a stage IIIc (T4,N3, M0)non-small cell lung cancer, adenocarcinoma presented with large right hilar mass in addition to bilateral hilar and mediastinal lymphadenopathy diagnosed in December 2020.Molecular studiesnegativefor any  actionable mutations.  Hecompletedweekly concurrent chemoradiation with carboplatin for an AUC of 2 and paclitaxel 45 mg/m2.He is status post6cycles. His last radiation treatment was on 03/08/2019.  The patient is currently undergoing consolidation immunotherapy with Imfinzi. He is status post 7 cycles. His treatment is on hold due to suspicious pneumonitis. He completed a high dose prednisone taper on7/6/21.  Labs were reviewed. Recommend that he proceed with cycle #8 on Friday as scheduled.   We will see him back for a follow up visit in 4 weeks for evaluation before starting cycle #9.   The patient was strongly encouraged to quit smoking.   The patient was advised to call immediately if he has any concerning symptoms in the interval. The patient voices understanding of current disease status and treatment options and is in agreement with the current care plan. All questions were answered. The patient knows to call the clinic with any problems, questions or concerns. We can certainly see the patient much sooner if necessary      No orders of the defined types were placed in this encounter.    Michiel Sivley L Paislei Dorval, [name] 11/24/19

## 2019-11-29 ENCOUNTER — Inpatient Hospital Stay: Payer: No Typology Code available for payment source | Attending: Internal Medicine

## 2019-11-29 ENCOUNTER — Encounter: Payer: Self-pay | Admitting: Physician Assistant

## 2019-11-29 ENCOUNTER — Other Ambulatory Visit: Payer: Self-pay

## 2019-11-29 ENCOUNTER — Inpatient Hospital Stay (HOSPITAL_BASED_OUTPATIENT_CLINIC_OR_DEPARTMENT_OTHER): Payer: No Typology Code available for payment source | Admitting: Physician Assistant

## 2019-11-29 VITALS — BP 103/89 | HR 87 | Temp 95.9°F | Resp 18 | Ht 67.0 in | Wt 187.5 lb

## 2019-11-29 DIAGNOSIS — C3401 Malignant neoplasm of right main bronchus: Secondary | ICD-10-CM | POA: Diagnosis not present

## 2019-11-29 DIAGNOSIS — Z5112 Encounter for antineoplastic immunotherapy: Secondary | ICD-10-CM | POA: Diagnosis not present

## 2019-11-29 DIAGNOSIS — C3491 Malignant neoplasm of unspecified part of right bronchus or lung: Secondary | ICD-10-CM | POA: Diagnosis not present

## 2019-11-29 DIAGNOSIS — J189 Pneumonia, unspecified organism: Secondary | ICD-10-CM | POA: Insufficient documentation

## 2019-11-29 DIAGNOSIS — Z87891 Personal history of nicotine dependence: Secondary | ICD-10-CM | POA: Diagnosis not present

## 2019-11-29 DIAGNOSIS — C778 Secondary and unspecified malignant neoplasm of lymph nodes of multiple regions: Secondary | ICD-10-CM | POA: Diagnosis not present

## 2019-11-29 DIAGNOSIS — Z79899 Other long term (current) drug therapy: Secondary | ICD-10-CM | POA: Insufficient documentation

## 2019-11-29 DIAGNOSIS — Z7901 Long term (current) use of anticoagulants: Secondary | ICD-10-CM | POA: Diagnosis not present

## 2019-11-29 DIAGNOSIS — E785 Hyperlipidemia, unspecified: Secondary | ICD-10-CM | POA: Insufficient documentation

## 2019-11-29 LAB — CMP (CANCER CENTER ONLY)
ALT: 20 U/L (ref 0–44)
AST: 17 U/L (ref 15–41)
Albumin: 3.6 g/dL (ref 3.5–5.0)
Alkaline Phosphatase: 100 U/L (ref 38–126)
Anion gap: 9 (ref 5–15)
BUN: 19 mg/dL (ref 8–23)
CO2: 25 mmol/L (ref 22–32)
Calcium: 9.6 mg/dL (ref 8.9–10.3)
Chloride: 103 mmol/L (ref 98–111)
Creatinine: 0.92 mg/dL (ref 0.61–1.24)
GFR, Estimated: >60 mL/min (ref >60)
Glucose, Bld: 88 mg/dL (ref 70–99)
Potassium: 4.2 mmol/L (ref 3.5–5.1)
Sodium: 137 mmol/L (ref 135–145)
Total Bilirubin: 0.3 mg/dL (ref 0.3–1.2)
Total Protein: 7.6 g/dL (ref 6.5–8.1)

## 2019-11-29 LAB — CBC WITH DIFFERENTIAL (CANCER CENTER ONLY)
Abs Immature Granulocytes: 0.02 10*3/uL (ref 0.00–0.07)
Basophils Absolute: 0 10*3/uL (ref 0.0–0.1)
Basophils Relative: 1 %
Eosinophils Absolute: 0.1 10*3/uL (ref 0.0–0.5)
Eosinophils Relative: 1 %
HCT: 47.7 % (ref 39.0–52.0)
Hemoglobin: 15.8 g/dL (ref 13.0–17.0)
Immature Granulocytes: 0 %
Lymphocytes Relative: 14 %
Lymphs Abs: 0.9 10*3/uL (ref 0.7–4.0)
MCH: 31 pg (ref 26.0–34.0)
MCHC: 33.1 g/dL (ref 30.0–36.0)
MCV: 93.5 fL (ref 80.0–100.0)
Monocytes Absolute: 0.6 10*3/uL (ref 0.1–1.0)
Monocytes Relative: 9 %
Neutro Abs: 4.9 10*3/uL (ref 1.7–7.7)
Neutrophils Relative %: 75 %
Platelet Count: 255 10*3/uL (ref 150–400)
RBC: 5.1 MIL/uL (ref 4.22–5.81)
RDW: 12.2 % (ref 11.5–15.5)
WBC Count: 6.4 10*3/uL (ref 4.0–10.5)
nRBC: 0 % (ref 0.0–0.2)

## 2019-11-29 LAB — TSH: TSH: 1.153 u[IU]/mL (ref 0.320–4.118)

## 2019-11-30 ENCOUNTER — Ambulatory Visit: Payer: No Typology Code available for payment source

## 2019-11-30 ENCOUNTER — Other Ambulatory Visit: Payer: No Typology Code available for payment source

## 2019-11-30 ENCOUNTER — Ambulatory Visit: Payer: No Typology Code available for payment source | Admitting: Physician Assistant

## 2019-11-30 ENCOUNTER — Other Ambulatory Visit: Payer: Self-pay | Admitting: Internal Medicine

## 2019-11-30 DIAGNOSIS — R1310 Dysphagia, unspecified: Secondary | ICD-10-CM

## 2019-12-01 ENCOUNTER — Other Ambulatory Visit: Payer: Self-pay | Admitting: Oncology

## 2019-12-01 ENCOUNTER — Other Ambulatory Visit: Payer: Self-pay

## 2019-12-01 ENCOUNTER — Ambulatory Visit: Payer: No Typology Code available for payment source

## 2019-12-01 ENCOUNTER — Inpatient Hospital Stay: Payer: No Typology Code available for payment source

## 2019-12-01 VITALS — BP 108/79 | HR 99 | Temp 98.2°F | Resp 19

## 2019-12-01 DIAGNOSIS — Z5112 Encounter for antineoplastic immunotherapy: Secondary | ICD-10-CM | POA: Diagnosis not present

## 2019-12-01 DIAGNOSIS — C3491 Malignant neoplasm of unspecified part of right bronchus or lung: Secondary | ICD-10-CM

## 2019-12-01 MED ORDER — SODIUM CHLORIDE 0.9 % IV SOLN
1500.0000 mg | Freq: Once | INTRAVENOUS | Status: AC
  Administered 2019-12-01: 1500 mg via INTRAVENOUS
  Filled 2019-12-01: qty 30

## 2019-12-01 MED ORDER — SODIUM CHLORIDE 0.9 % IV SOLN
Freq: Once | INTRAVENOUS | Status: AC
  Filled 2019-12-01: qty 250

## 2019-12-01 NOTE — Patient Instructions (Signed)
[facility] Discharge Instructions for Patients Receiving Chemotherapy  Today you received the following immunotherapy agents: Imfinzi.  To help prevent nausea and vomiting after your treatment, we encourage you to take your nausea medication as directed.   If you develop nausea and vomiting that is not controlled by your nausea medication, call the clinic.   BELOW ARE SYMPTOMS THAT SHOULD BE REPORTED IMMEDIATELY:  *FEVER GREATER THAN 100.5 F  *CHILLS WITH OR WITHOUT FEVER  NAUSEA AND VOMITING THAT IS NOT CONTROLLED WITH YOUR NAUSEA MEDICATION  *UNUSUAL SHORTNESS OF BREATH  *UNUSUAL BRUISING OR BLEEDING  TENDERNESS IN MOUTH AND THROAT WITH OR WITHOUT PRESENCE OF ULCERS  *URINARY PROBLEMS  *BOWEL PROBLEMS  UNUSUAL RASH Items with * indicate a potential emergency and should be followed up as soon as possible.  Feel free to call the clinic should you have any questions or concerns. The clinic phone number is [phone].  Please show the [facility] at check-in to the Emergency Department and triage nurse.

## 2019-12-08 ENCOUNTER — Other Ambulatory Visit: Payer: Self-pay | Admitting: Critical Care Medicine

## 2019-12-08 MED ORDER — STIOLTO RESPIMAT 2.5-2.5 MCG/ACT IN AERS
2.0000 | INHALATION_SPRAY | Freq: Every day | RESPIRATORY_TRACT | 11 refills | Status: DC
Start: 2019-12-08 — End: 2020-01-15

## 2019-12-08 NOTE — Progress Notes (Signed)
Stiolto filled as requested by pharmacy.  [name], DO 12/08/19 12:03 PM [facility]

## 2019-12-10 ENCOUNTER — Other Ambulatory Visit: Payer: Self-pay | Admitting: Internal Medicine

## 2019-12-10 DIAGNOSIS — R1310 Dysphagia, unspecified: Secondary | ICD-10-CM

## 2019-12-24 ENCOUNTER — Other Ambulatory Visit: Payer: Self-pay | Admitting: Internal Medicine

## 2019-12-24 DIAGNOSIS — R1310 Dysphagia, unspecified: Secondary | ICD-10-CM

## 2019-12-27 ENCOUNTER — Encounter: Payer: Self-pay | Admitting: Internal Medicine

## 2019-12-27 ENCOUNTER — Other Ambulatory Visit: Payer: Self-pay | Admitting: Internal Medicine

## 2019-12-27 ENCOUNTER — Inpatient Hospital Stay: Payer: No Typology Code available for payment source

## 2019-12-27 ENCOUNTER — Other Ambulatory Visit: Payer: Self-pay

## 2019-12-27 ENCOUNTER — Inpatient Hospital Stay: Payer: No Typology Code available for payment source | Attending: Internal Medicine | Admitting: Internal Medicine

## 2019-12-27 VITALS — BP 116/86 | HR 86 | Temp 97.8°F | Resp 18 | Ht 67.0 in | Wt 188.6 lb

## 2019-12-27 DIAGNOSIS — Z79899 Other long term (current) drug therapy: Secondary | ICD-10-CM | POA: Insufficient documentation

## 2019-12-27 DIAGNOSIS — Z87891 Personal history of nicotine dependence: Secondary | ICD-10-CM | POA: Insufficient documentation

## 2019-12-27 DIAGNOSIS — Z7901 Long term (current) use of anticoagulants: Secondary | ICD-10-CM | POA: Diagnosis not present

## 2019-12-27 DIAGNOSIS — C3490 Malignant neoplasm of unspecified part of unspecified bronchus or lung: Secondary | ICD-10-CM

## 2019-12-27 DIAGNOSIS — C3491 Malignant neoplasm of unspecified part of right bronchus or lung: Secondary | ICD-10-CM | POA: Diagnosis not present

## 2019-12-27 DIAGNOSIS — Z5112 Encounter for antineoplastic immunotherapy: Secondary | ICD-10-CM | POA: Diagnosis not present

## 2019-12-27 DIAGNOSIS — Z923 Personal history of irradiation: Secondary | ICD-10-CM | POA: Diagnosis not present

## 2019-12-27 DIAGNOSIS — R059 Cough, unspecified: Secondary | ICD-10-CM | POA: Insufficient documentation

## 2019-12-27 DIAGNOSIS — Z9221 Personal history of antineoplastic chemotherapy: Secondary | ICD-10-CM | POA: Insufficient documentation

## 2019-12-27 DIAGNOSIS — E785 Hyperlipidemia, unspecified: Secondary | ICD-10-CM | POA: Diagnosis not present

## 2019-12-27 LAB — CBC WITH DIFFERENTIAL (CANCER CENTER ONLY)
Abs Immature Granulocytes: 0.02 10*3/uL (ref 0.00–0.07)
Basophils Absolute: 0.1 10*3/uL (ref 0.0–0.1)
Basophils Relative: 1 %
Eosinophils Absolute: 0.2 10*3/uL (ref 0.0–0.5)
Eosinophils Relative: 3 %
HCT: 48.2 % (ref 39.0–52.0)
Hemoglobin: 16.2 g/dL (ref 13.0–17.0)
Immature Granulocytes: 0 %
Lymphocytes Relative: 19 %
Lymphs Abs: 0.9 10*3/uL (ref 0.7–4.0)
MCH: 30.8 pg (ref 26.0–34.0)
MCHC: 33.6 g/dL (ref 30.0–36.0)
MCV: 91.6 fL (ref 80.0–100.0)
Monocytes Absolute: 0.6 10*3/uL (ref 0.1–1.0)
Monocytes Relative: 13 %
Neutro Abs: 3 10*3/uL (ref 1.7–7.7)
Neutrophils Relative %: 64 %
Platelet Count: 228 10*3/uL (ref 150–400)
RBC: 5.26 MIL/uL (ref 4.22–5.81)
RDW: 12.6 % (ref 11.5–15.5)
WBC Count: 4.7 10*3/uL (ref 4.0–10.5)
nRBC: 0 % (ref 0.0–0.2)

## 2019-12-27 LAB — CMP (CANCER CENTER ONLY)
ALT: 21 U/L (ref 0–44)
AST: 20 U/L (ref 15–41)
Albumin: 3.7 g/dL (ref 3.5–5.0)
Alkaline Phosphatase: 100 U/L (ref 38–126)
Anion gap: 10 (ref 5–15)
BUN: 21 mg/dL (ref 8–23)
CO2: 23 mmol/L (ref 22–32)
Calcium: 9.3 mg/dL (ref 8.9–10.3)
Chloride: 106 mmol/L (ref 98–111)
Creatinine: 0.97 mg/dL (ref 0.61–1.24)
GFR, Estimated: >60 mL/min (ref >60)
Glucose, Bld: 89 mg/dL (ref 70–99)
Potassium: 4.1 mmol/L (ref 3.5–5.1)
Sodium: 139 mmol/L (ref 135–145)
Total Bilirubin: 0.4 mg/dL (ref 0.3–1.2)
Total Protein: 7.6 g/dL (ref 6.5–8.1)

## 2019-12-27 LAB — TSH: TSH: 1.079 u[IU]/mL (ref 0.320–4.118)

## 2019-12-27 MED ORDER — SODIUM CHLORIDE 0.9 % IV SOLN
Freq: Once | INTRAVENOUS | Status: AC
  Filled 2019-12-27: qty 250

## 2019-12-27 MED ORDER — SODIUM CHLORIDE 0.9 % IV SOLN
1500.0000 mg | Freq: Once | INTRAVENOUS | Status: AC
  Administered 2019-12-27: 1500 mg via INTRAVENOUS
  Filled 2019-12-27: qty 30

## 2019-12-27 NOTE — Progress Notes (Signed)
[facility] [phone]   [phone]  OFFICE PROGRESS NOTE  [name], MD [address] [place] [address]  DIAGNOSIS: Stage IIIc (T4,N3, M0)non-small cell lung cancer, adenocarcinoma presented with large right hilar mass in addition to bilateral hilar and mediastinal lymphadenopathy diagnosed in December 2020.  PRIOR THERAPY: Weekly concurrent chemoradiation with carboplatin for an AUC of 2 and paclitaxel 45 mg/m2. First dose starting 01/30/2019.   Status post 6 cycles.  CURRENT THERAPY: Consolidation immunotherapy with Imfinzi 1500 mg IV every 4 weeks. First dose expected on 04/19/2019.  Status post 8 cycles.  INTERVAL HISTORY: Philip [name] 64 y.o. male returns to the clinic today for follow-up visit.  The patient is feeling fine with no concerning complaints except for the dry cough.  He denied having any chest pain, shortness of breath, or hemoptysis.  He denied having any weight loss or night sweats.  He has no nausea, vomiting, diarrhea or constipation.  He denied having any headache or visual changes.  He continues to tolerate his treatment with immunotherapy fairly well.  The patient is here today for evaluation before starting cycle #9.  MEDICAL HISTORY: Past Medical History:  Diagnosis Date  . Hyperlipidemia   . lung ca dx'd 12/2018  . Pneumonia   . Tobacco abuse     ALLERGIES:  is allergic to norco [hydrocodone-acetaminophen].  MEDICATIONS:  Current Outpatient Medications  Medication Sig Dispense Refill  . albuterol (PROVENTIL) (2.5 MG/3ML) 0.083% nebulizer solution Take 3 mLs (2.5 mg total) by nebulization every 6 (six) hours as needed for wheezing or shortness of breath. 75 mL 12  . albuterol (VENTOLIN HFA) 108 (90 Base) MCG/ACT inhaler Inhale 2 puffs into the lungs every 4 (four) hours as needed for wheezing or shortness of breath. 18 g 6  . atorvastatin (LIPITOR) 20 MG tablet Take 20 mg by mouth  daily.    Marland Kitchen bismuth subsalicylate (PEPTO BISMOL) 262 MG/15ML suspension Take 30 mLs by mouth every 6 (six) hours as needed for indigestion.    Marland Kitchen ELIQUIS 5 MG TABS tablet TAKE 1 TABLET TWICE DAILY 60 tablet 2  . famotidine (PEPCID) 20 MG tablet TAKE 1 TABLET BY MOUTH TWICE A DAY 30 tablet 1  . lidocaine (LIDODERM) 5 % Place 1 patch onto the skin daily. Remove & Discard patch within 12 hours or as directed by MD 30 patch 0  . Nebulizers (COMPRESSOR/NEBULIZER) MISC 1 application by Does not apply route every 4 (four) hours as needed. 1 each 0  . omeprazole-sodium bicarbonate (ZEGERID) 40-1100 MG capsule Take 1 capsule by mouth daily before breakfast.    . polyethylene glycol (MIRALAX / GLYCOLAX) 17 g packet Take 17 g by mouth daily. 14 each 0  . sildenafil (REVATIO) 20 MG tablet Take 20-60 mg by mouth daily as needed (ED).     . sucralfate (CARAFATE) 1 g tablet Take 1 tablet (1 g total) by mouth 4 (four) times daily -  with meals and at bedtime. Dissolve in 10 mL of warm water prior to swallowing 120 tablet 1  . Tetrahydrozoline HCl (VISINE OP) Place 1 drop into both eyes daily as needed (redness).    . Tiotropium Bromide Monohydrate (SPIRIVA RESPIMAT) 1.25 MCG/ACT AERS Inhale 1 puff into the lungs daily. 4 g 11  . Tiotropium Bromide-Olodaterol (STIOLTO RESPIMAT) 2.5-2.5 MCG/ACT AERS Inhale 2 puffs into the lungs daily. 4 g 11   No current facility-administered medications for this visit.  SURGICAL HISTORY:  Past Surgical History:  Procedure Laterality Date  . COLONOSCOPY    . HAND SURGERY    . VIDEO BRONCHOSCOPY WITH ENDOBRONCHIAL ULTRASOUND N/A 12/22/2018   Procedure: VIDEO BRONCHOSCOPY WITH ENDOBRONCHIAL ULTRASOUND;  Surgeon: Garner Nash, DO;  Location: [facility];  Service: Thoracic;  Laterality: N/A;  . VIDEO BRONCHOSCOPY WITH ENDOBRONCHIAL ULTRASOUND N/A 01/09/2019   Procedure: VIDEO BRONCHOSCOPY WITH ENDOBRONCHIAL ULTRASOUND WITH FLUORO;  Surgeon: Garner Nash, DO;  Location: [facility];   Service: Thoracic;  Laterality: N/A;  . VIDEO BRONCHOSCOPY WITH RADIAL ENDOBRONCHIAL ULTRASOUND N/A 12/22/2018   Procedure: RADIAL ENDOBRONCHIAL ULTRASOUND;  Surgeon: Garner Nash, DO;  Location[facility];  Service: Thoracic;  Laterality: N/A;    REVIEW OF SYSTEMS:  A comprehensive review of systems was negative except for: Respiratory: positive for cough   PHYSICAL EXAMINATION: General appearance: alert, cooperative and no distress Head: Normocephalic, without obvious abnormality, atraumatic Neck: no adenopathy, no JVD, supple, symmetrical, trachea midline and thyroid not enlarged, symmetric, no tenderness/mass/nodules Lymph nodes: Cervical, supraclavicular, and axillary nodes normal. Resp: clear to auscultation bilaterally Back: symmetric, no curvature. ROM normal. No CVA tenderness. Cardio: regular rate and rhythm, S1, S2 normal, no murmur, click, rub or gallop GI: soft, non-tender; bowel sounds normal; no masses,  no organomegaly Extremities: extremities normal, atraumatic, no cyanosis or edema  ECOG PERFORMANCE STATUS: 1 - Symptomatic but completely ambulatory  Blood pressure 116/86, pulse 86, temperature 97.8 F (36.6 C), temperature source Tympanic, resp. rate 18, height 5\' 7"  (1.702 m), weight 188 lb 9.6 oz (85.5 kg), SpO2 96 %.  LABORATORY DATA: Lab Results  Component Value Date   WBC 4.7 12/27/2019   HGB 16.2 12/27/2019   HCT 48.2 12/27/2019   MCV 91.6 12/27/2019   PLT 228 12/27/2019      Chemistry      Component Value Date/Time   NA 137 11/29/2019 0952   K 4.2 11/29/2019 0952   CL 103 11/29/2019 0952   CO2 25 11/29/2019 0952   BUN 19 11/29/2019 0952   CREATININE 0.92 11/29/2019 0952      Component Value Date/Time   CALCIUM 9.6 11/29/2019 0952   ALKPHOS 100 11/29/2019 0952   AST 17 11/29/2019 0952   ALT 20 11/29/2019 0952   BILITOT 0.3 11/29/2019 0952       RADIOGRAPHIC STUDIES: No results found.  ASSESSMENT AND PLAN: This is a very pleasant 64 years  old white male with a stage IIIc non-small cell lung cancer, adenocarcinoma diagnosed in December 2020 The patient completed a course of concurrent chemoradiation with weekly carboplatin and paclitaxel status post 6 cycles and he tolerated his treatment well and has partial response. He is currently undergoing consolidation treatment with immunotherapy with Imfinzi 1500 mg IV every 4 weeks status post 8 cycles. The patient continues to tolerate his treatment with Imfinzi fairly well. I recommended for him to proceed with cycle #9 today as planned. I will see him back for follow-up visit in 4 weeks for evaluation with repeat CT scan of the chest for restaging of his disease. For the cough, he is currently on Mucinex. The patient was advised to call immediately if he has any concerning symptoms in the interval. The patient voices understanding of current disease status and treatment options and is in agreement with the current care plan. All questions were answered. The patient knows to call the clinic with any problems, questions or concerns. We can certainly see the patient much sooner if necessary.  Disclaimer: This note was dictated with voice recognition software. Similar sounding words can inadvertently be transcribed and may not be corrected upon review.

## 2020-01-01 ENCOUNTER — Other Ambulatory Visit: Payer: Self-pay | Admitting: Internal Medicine

## 2020-01-01 DIAGNOSIS — R1310 Dysphagia, unspecified: Secondary | ICD-10-CM

## 2020-01-02 ENCOUNTER — Telehealth: Payer: Self-pay | Admitting: Internal Medicine

## 2020-01-02 NOTE — Telephone Encounter (Signed)
Scheduled per los. Called, not able to leave msg. Mailed printout of added appts

## 2020-01-14 ENCOUNTER — Other Ambulatory Visit: Payer: Self-pay | Admitting: Internal Medicine

## 2020-01-14 DIAGNOSIS — R1310 Dysphagia, unspecified: Secondary | ICD-10-CM

## 2020-01-15 ENCOUNTER — Other Ambulatory Visit: Payer: Self-pay

## 2020-01-15 ENCOUNTER — Ambulatory Visit (INDEPENDENT_AMBULATORY_CARE_PROVIDER_SITE_OTHER): Payer: No Typology Code available for payment source | Admitting: Critical Care Medicine

## 2020-01-15 ENCOUNTER — Encounter: Payer: Self-pay | Admitting: Critical Care Medicine

## 2020-01-15 VITALS — BP 100/60 | HR 100 | Temp 98.1°F | Ht 65.0 in | Wt 188.0 lb

## 2020-01-15 DIAGNOSIS — R0600 Dyspnea, unspecified: Secondary | ICD-10-CM

## 2020-01-15 DIAGNOSIS — Z86711 Personal history of pulmonary embolism: Secondary | ICD-10-CM | POA: Diagnosis not present

## 2020-01-15 DIAGNOSIS — C3491 Malignant neoplasm of unspecified part of right bronchus or lung: Secondary | ICD-10-CM | POA: Diagnosis not present

## 2020-01-15 DIAGNOSIS — Z23 Encounter for immunization: Secondary | ICD-10-CM | POA: Diagnosis not present

## 2020-01-15 DIAGNOSIS — A319 Mycobacterial infection, unspecified: Secondary | ICD-10-CM

## 2020-01-15 DIAGNOSIS — A310 Pulmonary mycobacterial infection: Secondary | ICD-10-CM

## 2020-01-15 DIAGNOSIS — R0609 Other forms of dyspnea: Secondary | ICD-10-CM

## 2020-01-15 DIAGNOSIS — Z720 Tobacco use: Secondary | ICD-10-CM

## 2020-01-15 DIAGNOSIS — J449 Chronic obstructive pulmonary disease, unspecified: Secondary | ICD-10-CM

## 2020-01-15 MED ORDER — STIOLTO RESPIMAT 2.5-2.5 MCG/ACT IN AERS: 2.0000 | INHALATION_SPRAY | Freq: Every day | RESPIRATORY_TRACT | 3 refills | Status: DC

## 2020-01-15 MED ORDER — RIVAROXABAN 20 MG PO TABS: 20.0000 mg | ORAL_TABLET | Freq: Every day | ORAL | 11 refills | Status: DC

## 2020-01-15 NOTE — Patient Instructions (Addendum)
Thank you for visiting Dr. Carlis Abbott at [facility]. We recommend the following: Orders Placed This Encounter  Procedures  . AFB Culture & Smear  . AFB Culture & Smear   Orders Placed This Encounter  Procedures  . AFB Culture & Smear    Standing Status:   Future    Standing Expiration Date:   01/14/2021  . AFB Culture & Smear    Standing Status:   Future    Standing Expiration Date:   01/14/2021    When you start taking Xarelto, please stop taking Eliquis. Xarelto has to be taken with food- we recommend taking with dinner every night.  Bring in 2 sputum samples to the lab.   Meds ordered this encounter  Medications  . Tiotropium Bromide-Olodaterol (STIOLTO RESPIMAT) 2.5-2.5 MCG/ACT AERS    Sig: Inhale 2 puffs into the lungs daily.    Dispense:  3 each    Refill:  3    Order Specific Question:   Lot Number?    Answer:   [id number]    Order Specific Question:   Expiration Date?    Answer:   10/02/2021    Order Specific Question:   Manufacturer?    Answer:   [employer] [[id number]]  . rivaroxaban (XARELTO) 20 MG TABS tablet    Sig: Take 1 tablet (20 mg total) by mouth daily with supper.    Dispense:  30 tablet    Refill:  11    Return in about 2 months (around 03/17/2020). with Dr. [name] (30 minutes visit).    Please do your part to reduce the spread of COVID-19.

## 2020-01-15 NOTE — Progress Notes (Signed)
Synopsis: Referred in January 2020 for pulmonary MAC by [name], MD.  Previously seen by Dr. Adair Laundry.  Previously seen by Dr. Valeta Harms.  Subjective:   PATIENT ID: Philip [name] GENDER: male DOB: [birth year], MRN: [id number]  Chief Complaint  Patient presents with  . Follow-up    Patient states that his sputum is thick , feels like he has a possible head cold. Wants to talk about Eliquis    Mr. Philip [name] is a 64 year old gentleman with a history of stage III lung adenocarcinoma, COPD who presents for follow-up.  He continues on Stiolto maintenance for COPD, which has improved his shortness of breath.  Since starting immunotherapy in March 2021 he noticed worsening shortness of breath, which has remained relatively stable.  His exertional dyspnea has slightly improved since he has been working on increasing his exercise tolerance.  He has not required his albuterol.  His immunotherapy was stopped temporarily and he was given prednisone, which caused him to gain weight but did not improve his symptoms.  He was recently seen in follow-up by NP Mack on 10/10/2019 where he was transition from [facility] to [facility].  At that time he was recommended to consider pulmonary rehab, but declined.  Follow-up note with oncology from 12/27/2019 reviewed.  He has started smoking about 5 cigarettes a day again recently due to stress.  He had a pulmonary embolus triggered by his cancer.  He has continued on Eliquis, but has not been notified by his insurance that they will no longer be covering this in the future.  Up-to-date on Pneumovax 23 and Covid vaccines x2.  Needs Prevnar 13 in March 2022, Covid booster, flu shot.    OV 04/11/19: Mr. 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  He was last seen by oncology, [name] Heilingoetter and Dr. Earlie Server on 03/13/2019, note reviewed.  He has completed 6  cycles of carboplatin and paclitaxel, with progressive severe symptoms later in his course.  His course was stopped after the 6 cycles, and radiation therapy was stopped early due to esophagitis, nausea, 5 pound weight loss.  He will soon be starting immunotherapy.  He has gained weight back now and his appetite is returned to normal.  He still has mild dysgeusia which is improving.  His dysphagia is improved.  He feels very deconditioned overall from chemo with some shortness of breath with activity, but he is starting to feel better.  He has minimal phlegm since quitting smoking about a month ago.  He has continued using Abreva for his COPD.  Saw ENT at [facility] for audiology evaluation and management -bilateral sensorineural hearing loss and tinnitus.  Hearing aids have been recommended.    OV 03/02/2019: Mr. 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 He has stage IIIc lung adenocarcinoma (T4N3M0), which is currently receiving carboplatin-paclitaxel weekly (2 more weeks to go) and radiation (early January through 2/12). He has smoked 2 ppd x 5years, and has cut down to 0.5ppd recently. He has been exposed to multiple occupational inhalation exposures without a respirator- wood dust, car paint, welding & metal grinding, pottery. 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 He has chronic tinnitus. He has never been evaluated by audiology. He  has ben tolerating his chemo well with fatigue being his main complaint.   He has had a chronic cough with a productive cough for several years. He has occasional wheezing when he has sputum that he needs to clear. He has tried albuterol a few times since it was prescribed recently. He is active and has no DOE.     Past Medical History:  Diagnosis Date  . Hyperlipidemia   .  lung ca dx'd 12/2018  . Pneumonia   . Tobacco abuse      Family History  Problem Relation Age of Onset  . Cancer Father   . Stomach cancer Father      Past Surgical History:  Procedure Laterality Date  . COLONOSCOPY    . HAND SURGERY    . VIDEO BRONCHOSCOPY WITH ENDOBRONCHIAL ULTRASOUND N/A 12/22/2018   Procedure: VIDEO BRONCHOSCOPY WITH ENDOBRONCHIAL ULTRASOUND;  Surgeon: Garner Nash, DO;  Location: [place];  Service: Thoracic;  Laterality: N/A;  . VIDEO BRONCHOSCOPY WITH ENDOBRONCHIAL ULTRASOUND N/A 01/09/2019   Procedure: VIDEO BRONCHOSCOPY WITH ENDOBRONCHIAL ULTRASOUND WITH FLUORO;  Surgeon: Garner Nash, DO;  Location: [facility];  Service: Thoracic;  Laterality: N/A;  . VIDEO BRONCHOSCOPY WITH RADIAL ENDOBRONCHIAL ULTRASOUND N/A 12/22/2018   Procedure: RADIAL ENDOBRONCHIAL ULTRASOUND;  Surgeon: Garner Nash, DO;  Location: [facility];  Service: Thoracic;  Laterality: N/A;    Social History   Socioeconomic History  . Marital status: Married    Spouse name: Not on file  . Number of children: Not on file  . Years of education: Not on file  . Highest education level: Not on file  Occupational History  . Not on file  Tobacco Use  . Smoking status: Current Every Day Smoker    Packs/day: 0.25    Years: 45.00    Pack years: 11.25    Types: Cigarettes    Start date: 07/04/1970  . Smokeless tobacco: Former Systems developer    Types: Chew  . Tobacco comment: 4-5 cigarettes a day  Vaping Use  . Vaping Use: Never used  Substance and Sexual Activity  . Alcohol use: Yes    Comment: drinks 1/2 5th every weekend  . Drug use: Not Currently    Types: Marijuana    Comment: none for 20 years  . Sexual activity: Not on file  Other Topics Concern  . Not on file  Social History Narrative  . Not on file   Social Determinants of Health   Financial Resource Strain: Not on file  Food Insecurity: Not on file  Transportation Needs: Not on file  Physical Activity: Not on file  Stress: Not on  file  Social Connections: Not on file  Intimate Partner Violence: Not on file     Allergies  Allergen Reactions  . Norco [Hydrocodone-Acetaminophen] Other (See Comments)    Made pt feel jittery      Immunization History  Administered Date(s) Administered  . Influenza,inj,Quad PF,6+ Mos 11/08/2018, 01/15/2020  . PFIZER SARS-COV-2 Vaccination 04/27/2019, 05/11/2019, 12/26/2019  . Pneumococcal Polysaccharide-23 10/10/2019  . Td 05/18/2014    Outpatient Medications Prior to Visit  Medication Sig Dispense Refill  . albuterol (PROVENTIL) (2.5 MG/3ML) 0.083% nebulizer solution Take 3 mLs (2.5 mg total) by nebulization every 6 (six) hours as needed for wheezing or shortness of breath. 75 mL 12  . albuterol (VENTOLIN HFA) 108 (90 Base) MCG/ACT inhaler Inhale 2 puffs into the lungs every 4 (four) hours as needed for wheezing or shortness of breath. 18 g  6  . atorvastatin (LIPITOR) 20 MG tablet Take 20 mg by mouth daily.    Marland Kitchen bismuth subsalicylate (PEPTO BISMOL) 262 MG/15ML suspension Take 30 mLs by mouth every 6 (six) hours as needed for indigestion.    . lidocaine (LIDODERM) 5 % Place 1 patch onto the skin daily. Remove & Discard patch within 12 hours or as directed by MD 30 patch 0  . Nebulizers (COMPRESSOR/NEBULIZER) MISC 1 application by Does not apply route every 4 (four) hours as needed. 1 each 0  . omeprazole-sodium bicarbonate (ZEGERID) 40-1100 MG capsule Take 1 capsule by mouth daily before breakfast.    . polyethylene glycol (MIRALAX / GLYCOLAX) 17 g packet Take 17 g by mouth daily. 14 each 0  . sildenafil (REVATIO) 20 MG tablet Take 20-60 mg by mouth daily as needed (ED).     . sucralfate (CARAFATE) 1 g tablet Take 1 tablet (1 g total) by mouth 4 (four) times daily -  with meals and at bedtime. Dissolve in 10 mL of warm water prior to swallowing 120 tablet 1  . Tetrahydrozoline HCl (VISINE OP) Place 1 drop into both eyes daily as needed (redness).    Marland Kitchen ELIQUIS 5 MG TABS tablet TAKE 1  TABLET TWICE DAILY 60 tablet 2  . famotidine (PEPCID) 20 MG tablet TAKE 1 TABLET BY MOUTH TWICE A DAY 30 tablet 1  . Tiotropium Bromide-Olodaterol (STIOLTO RESPIMAT) 2.5-2.5 MCG/ACT AERS Inhale 2 puffs into the lungs daily. 4 g 11  . Tiotropium Bromide Monohydrate (SPIRIVA RESPIMAT) 1.25 MCG/ACT AERS Inhale 1 puff into the lungs daily. 4 g 11   No facility-administered medications prior to visit.    Review of Systems  Constitutional: Positive for malaise/fatigue. Negative for chills and fever.  HENT: Positive for hearing loss.   Respiratory: Positive for cough and sputum production. Negative for wheezing.   Cardiovascular: Negative for chest pain and leg swelling.     Objective:   Vitals:   01/15/20 1001  BP: 100/60  Pulse: 100  Temp: 98.1 F (36.7 C)  TempSrc: Temporal  SpO2: 94%  Weight: 188 lb (85.3 kg)  Height: _0  (1.651 m)   94% on   RA BMI Readings from Last 3 Encounters:  01/15/20 31.28 kg/m  12/27/19 29.54 kg/m  11/29/19 29.37 kg/m   Wt Readings from Last 3 Encounters:  01/15/20 188 lb (85.3 kg)  12/27/19 188 lb 9.6 oz (85.5 kg)  11/29/19 187 lb 8 oz (85 kg)    Physical Exam Vitals reviewed.  Constitutional:      General: He is not in acute distress.    Appearance: He is not ill-appearing.  HENT:     Head: Normocephalic and atraumatic.  Eyes:     General: No scleral icterus. Cardiovascular:     Rate and Rhythm: Normal rate and regular rhythm.     Heart sounds: No murmur heard.   Pulmonary:     Comments: Breathing comfortably on room air, intermittent nonproductive cough.  Scattered expiratory wheezing and inspiratory squeaks.  No conversational dyspnea. Abdominal:     General: There is no distension.     Palpations: Abdomen is soft.     Tenderness: There is no abdominal tenderness.  Musculoskeletal:        General: No swelling or deformity.     Cervical back: Neck supple.  Lymphadenopathy:     Cervical: No cervical adenopathy.  Skin:     General: Skin is warm and dry.     Findings: No  rash.  Neurological:     General: No focal deficit present.     Mental Status: He is alert.     Coordination: Coordination normal.  Psychiatric:        Mood and Affect: Mood normal.        Behavior: Behavior normal.      CBC    Component Value Date/Time   WBC 4.7 12/27/2019 1112   WBC 6.3 06/19/2019 0351   RBC 5.26 12/27/2019 1112   HGB 16.2 12/27/2019 1112   HCT 48.2 12/27/2019 1112   PLT 228 12/27/2019 1112   MCV 91.6 12/27/2019 1112   MCH 30.8 12/27/2019 1112   MCHC 33.6 12/27/2019 1112   RDW 12.6 12/27/2019 1112   LYMPHSABS 0.9 12/27/2019 1112   MONOABS 0.6 12/27/2019 1112   EOSABS 0.2 12/27/2019 1112   BASOSABS 0.1 12/27/2019 1112    CHEMISTRY No results for input(s): NA, K, CL, CO2, GLUCOSE, BUN, CREATININE, CALCIUM, MG, PHOS in the last 168 hours. Estimated Creatinine Clearance: 77.3 mL/min (by C-G formula based on SCr of 0.97 mg/dL).   Chest Imaging- films reviewed: CT PET 01/03/2019-severe emphysema, right hilar mass with air bronchograms, medial RLL cystic fibrotic changes.  PET avidity of the inferior portion of the mass and medial fibrotic areas of the lower lobe.  CT chest with contrast 04/10/2018-persistent right mass, unchanged right paraseptal emphysema versus cavitary lesions.  Improved adenopathy.  CT chest with contrast 10/06/2019-bilateral centrilobular and paraseptal emphysema.  Increased opacification of right lower lobe cystic area in the medial base compared to March 2021, but improved compared to June 2021 CT.  Persistence of hilar mass on the right.  New irregularly shaped opacity in the left upper lobe.  Increased areas of patchy groundglass and septal thickening in the right upper lobe.  Micro: 01/09/2019 AFB: Component 1 mo ago  Organism ID [id number]    Comment: Mycobacterium avium complex  Amikacin Comment   Comment: 16.0 ug/mL Susceptible  Clarithromycin 2.0 ug/mL Susceptible   Linezolid  Comment   Comment: 16.0 ug/mL Intermediate  Moxifloxacin 4.0 ug/mL Resistant   Streptomycin 64.0 ug/mL    01/09/2019 fungus culture-negative 01/09/2019 BAL-many PMN, negative culture 06/17/2019 respiratory culture-normal flora  Pulmonary Functions Testing Results: PFT Results Latest Ref Rng & Units 12/21/2018  FVC-Pre L 3.07  FVC-Predicted Pre % 76  FVC-Post L 3.23  FVC-Predicted Post % 80  Pre FEV1/FVC % % 67  Post FEV1/FCV % % 71  FEV1-Pre L 2.07  FEV1-Predicted Pre % 68  FEV1-Post L 2.28  DLCO uncorrected ml/min/mmHg 15.47  DLCO UNC% % 64  DLVA Predicted % 79  TLC L 5.35  TLC % Predicted % 86  RV % Predicted % 104   2020- mild obstruction without significant bronchodilator reversibility.  No significant restriction, air trapping, or hyperinflation.  Mildly reduced diffusion.  Flow volume loop supports obstruction.  Pathology 01/09/2019:  RLL adenocarcinoma November 2020 bronch nondiagnostic     Assessment & Plan:     ICD-10-CM   1. 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 Need for immunization against influenza  Z23 Flu Vaccine QUAD 36+ mos IM  3. History of pulmonary embolus (PE)  Z86.711   4. Adenocarcinoma of right lung, stage 3 (HCC)  C34.91   5. Dyspnea on exertion  R06.00   6. Chronic obstructive pulmonary disease, unspecified COPD type ([place])  J44.9   7. 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 Tobacco abuse  Z72.0      Lung adenocarcinoma- stage IIIc (T4N3M0).  Previously received carboplatin, paclitaxel, and radiation from Dec 2020-  mid March 2021.  On consolidation immunotherapy with Imfinzi since March 2021.  New infiltrates scattered throughout both lungs, right greater than left are concerning.  These are unlikely to be malignancy related and do not follow a pattern consistent with radiation fibrosis.  Although these could be due to autoimmune pneumonitis from immunotherapy, he had  no benefit with steroids and holding the medication my main concern is this is progressive NTM. -Reviewed follow-up chest CTs from June and Sept 2021.  -Ongoing management per oncology.  He has been tolerating immunotherapy relatively well.  Agree with ongoing aggressive monitoring of symptoms.  COPD- partial BD reversibility. Ongoing tobacco abuse. -Continue Stiolto daily -Continue albuterol as needed -Recommend complete smoking cessation  Pulmonary MAI infection - likely more disseminated throughout both lungs. Resistant to quinolones; intermediate susceptibility to linezolid.  Stable initially on imaging despite immunosuppression associated with chemotherapy regimen in March 2021, but worsened since. -Repeat AFB cultures x2.  Would plan to start therapy if MAI confirmed to be still present -Avoid ICS and steroids as much as possible. -PLEASE AVOID MONOTHERAPY WITH MACROLIDES DUE TO RISK OF INDUCIBLE RESISTANCE OF MAI.  Tobacco abuse -Discussed importance of quitting smoking.  Recommend use of nicotine replacement therapy, especially using gum if stress is a situational trigger.  Chronic hearing loss, tinnitus -Appreciate ENTs assistance. -May need periodic audiology evaluation while on MAI treatment in the future  History of PE provoked by cancer -Start Xarelto once daily once his current prescription of Eliquis is completed due to change in insurance coverage.  RTC in 2 months with Dr. [name].     Current Outpatient Medications:  .  albuterol (PROVENTIL) (2.5 MG/3ML) 0.083% nebulizer solution, Take 3 mLs (2.5 mg total) by nebulization every 6 (six) hours as needed for wheezing or shortness of breath., Disp: 75 mL, Rfl: 12 .  albuterol (VENTOLIN HFA) 108 (90 Base) MCG/ACT inhaler, Inhale 2 puffs into the lungs every 4 (four) hours as needed for wheezing or shortness of breath., Disp: 18 g, Rfl: 6 .  atorvastatin (LIPITOR) 20 MG tablet, Take 20 mg by mouth daily., Disp: , Rfl:   .  bismuth subsalicylate (PEPTO BISMOL) 262 MG/15ML suspension, Take 30 mLs by mouth every 6 (six) hours as needed for indigestion., Disp: , Rfl:  .  lidocaine (LIDODERM) 5 %, Place 1 patch onto the skin daily. Remove & Discard patch within 12 hours or as directed by MD, Disp: 30 patch, Rfl: 0 .  Nebulizers (COMPRESSOR/NEBULIZER) MISC, 1 application by Does not apply route every 4 (four) hours as needed., Disp: 1 each, Rfl: 0 .  omeprazole-sodium bicarbonate (ZEGERID) 40-1100 MG capsule, Take 1 capsule by mouth daily before breakfast., Disp: , Rfl:  .  polyethylene glycol (MIRALAX / GLYCOLAX) 17 g packet, Take 17 g by mouth daily., Disp: 14 each, Rfl: 0 .  sildenafil (REVATIO) 20 MG tablet, Take 20-60 mg by mouth daily as needed (ED). , Disp: , Rfl:  .  sucralfate (CARAFATE) 1 g tablet, Take 1 tablet (1 g total) by mouth 4 (four) times daily -  with meals and at bedtime. Dissolve in 10 mL of warm water prior to swallowing, Disp: 120 tablet, Rfl: 1 .  Tetrahydrozoline HCl (VISINE OP), Place 1 drop into both eyes daily as needed (redness)., Disp: , Rfl:  .  famotidine (PEPCID) 20 MG tablet,  TAKE 1 TABLET BY MOUTH TWICE A DAY, Disp: 30 tablet, Rfl: 1 .  rivaroxaban (XARELTO) 20 MG TABS tablet, Take 1 tablet (20 mg total) by mouth daily with supper., Disp: 30 tablet, Rfl: 11 .  Tiotropium Bromide-Olodaterol (STIOLTO RESPIMAT) 2.5-2.5 MCG/ACT AERS, Inhale 2 puffs into the lungs daily., Disp: 3 each, Rfl: 3    [name], DO [facility] 01/15/2020 1:01 PM

## 2020-01-24 ENCOUNTER — Encounter (HOSPITAL_COMMUNITY): Payer: Self-pay

## 2020-01-24 ENCOUNTER — Ambulatory Visit (HOSPITAL_COMMUNITY)
Admission: RE | Admit: 2020-01-24 | Discharge: 2020-01-24 | Disposition: A | Discharge status: Home / Self Care | Payer: No Typology Code available for payment source | Source: Ambulatory Visit | Attending: Internal Medicine | Admitting: Internal Medicine

## 2020-01-24 ENCOUNTER — Other Ambulatory Visit: Payer: Self-pay

## 2020-01-24 DIAGNOSIS — C3490 Malignant neoplasm of unspecified part of unspecified bronchus or lung: Secondary | ICD-10-CM | POA: Diagnosis not present

## 2020-01-24 MED ORDER — IOHEXOL 300 MG/ML  SOLN
75.0000 mL | Freq: Once | INTRAMUSCULAR | Status: AC | PRN
  Administered 2020-01-24: 75 mL via INTRAVENOUS

## 2020-01-25 ENCOUNTER — Inpatient Hospital Stay: Payer: No Typology Code available for payment source | Attending: Internal Medicine | Admitting: Internal Medicine

## 2020-01-25 ENCOUNTER — Inpatient Hospital Stay: Payer: No Typology Code available for payment source

## 2020-01-25 ENCOUNTER — Encounter: Payer: Self-pay | Admitting: Internal Medicine

## 2020-01-25 ENCOUNTER — Other Ambulatory Visit: Payer: Self-pay

## 2020-01-25 VITALS — BP 96/62 | HR 89 | Temp 98.9°F | Resp 17 | Ht 65.0 in | Wt 188.2 lb

## 2020-01-25 DIAGNOSIS — Z5112 Encounter for antineoplastic immunotherapy: Secondary | ICD-10-CM | POA: Insufficient documentation

## 2020-01-25 DIAGNOSIS — C3491 Malignant neoplasm of unspecified part of right bronchus or lung: Secondary | ICD-10-CM | POA: Diagnosis not present

## 2020-01-25 DIAGNOSIS — C3401 Malignant neoplasm of right main bronchus: Secondary | ICD-10-CM | POA: Diagnosis not present

## 2020-01-25 DIAGNOSIS — C778 Secondary and unspecified malignant neoplasm of lymph nodes of multiple regions: Secondary | ICD-10-CM | POA: Insufficient documentation

## 2020-01-25 DIAGNOSIS — I700 Atherosclerosis of aorta: Secondary | ICD-10-CM | POA: Insufficient documentation

## 2020-01-25 DIAGNOSIS — E785 Hyperlipidemia, unspecified: Secondary | ICD-10-CM | POA: Diagnosis not present

## 2020-01-25 DIAGNOSIS — D3501 Benign neoplasm of right adrenal gland: Secondary | ICD-10-CM | POA: Insufficient documentation

## 2020-01-25 DIAGNOSIS — J439 Emphysema, unspecified: Secondary | ICD-10-CM | POA: Insufficient documentation

## 2020-01-25 DIAGNOSIS — Z79899 Other long term (current) drug therapy: Secondary | ICD-10-CM | POA: Insufficient documentation

## 2020-01-25 LAB — CBC WITH DIFFERENTIAL (CANCER CENTER ONLY)
Abs Immature Granulocytes: 0.03 10*3/uL (ref 0.00–0.07)
Basophils Absolute: 0 10*3/uL (ref 0.0–0.1)
Basophils Relative: 1 %
Eosinophils Absolute: 0.2 10*3/uL (ref 0.0–0.5)
Eosinophils Relative: 2 %
HCT: 46.6 % (ref 39.0–52.0)
Hemoglobin: 15.6 g/dL (ref 13.0–17.0)
Immature Granulocytes: 0 %
Lymphocytes Relative: 15 %
Lymphs Abs: 1 10*3/uL (ref 0.7–4.0)
MCH: 30.9 pg (ref 26.0–34.0)
MCHC: 33.5 g/dL (ref 30.0–36.0)
MCV: 92.3 fL (ref 80.0–100.0)
Monocytes Absolute: 0.6 10*3/uL (ref 0.1–1.0)
Monocytes Relative: 9 %
Neutro Abs: 4.9 10*3/uL (ref 1.7–7.7)
Neutrophils Relative %: 73 %
Platelet Count: 244 10*3/uL (ref 150–400)
RBC: 5.05 MIL/uL (ref 4.22–5.81)
RDW: 13.2 % (ref 11.5–15.5)
WBC Count: 6.7 10*3/uL (ref 4.0–10.5)
nRBC: 0 % (ref 0.0–0.2)

## 2020-01-25 LAB — CMP (CANCER CENTER ONLY)
ALT: 23 U/L (ref 0–44)
AST: 20 U/L (ref 15–41)
Albumin: 3.7 g/dL (ref 3.5–5.0)
Alkaline Phosphatase: 98 U/L (ref 38–126)
Anion gap: 8 (ref 5–15)
BUN: 17 mg/dL (ref 8–23)
CO2: 24 mmol/L (ref 22–32)
Calcium: 9.4 mg/dL (ref 8.9–10.3)
Chloride: 104 mmol/L (ref 98–111)
Creatinine: 1 mg/dL (ref 0.61–1.24)
GFR, Estimated: >60 mL/min (ref >60)
Glucose, Bld: 82 mg/dL (ref 70–99)
Potassium: 4.4 mmol/L (ref 3.5–5.1)
Sodium: 136 mmol/L (ref 135–145)
Total Bilirubin: 0.7 mg/dL (ref 0.3–1.2)
Total Protein: 7.5 g/dL (ref 6.5–8.1)

## 2020-01-25 LAB — TSH: TSH: 1.72 u[IU]/mL (ref 0.320–4.118)

## 2020-01-25 MED ORDER — SODIUM CHLORIDE 0.9 % IV SOLN
Freq: Once | INTRAVENOUS | Status: AC
  Filled 2020-01-25: qty 250

## 2020-01-25 MED ORDER — SODIUM CHLORIDE 0.9 % IV SOLN
1500.0000 mg | Freq: Once | INTRAVENOUS | Status: AC
  Administered 2020-01-25: 1500 mg via INTRAVENOUS
  Filled 2020-01-25: qty 30

## 2020-01-25 NOTE — Progress Notes (Signed)
[place] Telephone:(336) [phone]   [phone]  OFFICE PROGRESS NOTE  [name], MD [address] [place] [address]  DIAGNOSIS: Stage IIIc (T4,N3, M0)non-small cell lung cancer, adenocarcinoma presented with large right hilar mass in addition to bilateral hilar and mediastinal lymphadenopathy diagnosed in December 2020.  PRIOR THERAPY: Weekly concurrent chemoradiation with carboplatin for an AUC of 2 and paclitaxel 45 mg/m2. First dose starting 01/30/2019.   Status post 6 cycles.  CURRENT THERAPY: Consolidation immunotherapy with Imfinzi 1500 mg IV every 4 weeks. First dose expected on 04/19/2019.  Status post 9 cycles.  INTERVAL HISTORY: Philip [name] 64 y.o. male returns to the clinic today for follow-up visit.  The patient is feeling fine today with no concerning complaints except for mild cough.  He denied having any chest pain, shortness of breath, or hemoptysis.  He has no nausea, vomiting, diarrhea or constipation.  He has no headache or visual changes.  He denied having any significant weight loss or night sweats.  He had repeat CT scan of the chest performed recently and is here for evaluation and discussion of his discuss results.  MEDICAL HISTORY: Past Medical History:  Diagnosis Date  . Hyperlipidemia   . lung ca dx'd 12/2018  . Pneumonia   . Tobacco abuse     ALLERGIES:  is allergic to norco [hydrocodone-acetaminophen].  MEDICATIONS:  Current Outpatient Medications  Medication Sig Dispense Refill  . albuterol (PROVENTIL) (2.5 MG/3ML) 0.083% nebulizer solution Take 3 mLs (2.5 mg total) by nebulization every 6 (six) hours as needed for wheezing or shortness of breath. 75 mL 12  . albuterol (VENTOLIN HFA) 108 (90 Base) MCG/ACT inhaler Inhale 2 puffs into the lungs every 4 (four) hours as needed for wheezing or shortness of breath. 18 g 6  . atorvastatin (LIPITOR) 20 MG tablet Take 20 mg by mouth daily.    Marland Kitchen  bismuth subsalicylate (PEPTO BISMOL) 262 MG/15ML suspension Take 30 mLs by mouth every 6 (six) hours as needed for indigestion.    . famotidine (PEPCID) 20 MG tablet TAKE 1 TABLET BY MOUTH TWICE A DAY 30 tablet 1  . lidocaine (LIDODERM) 5 % Place 1 patch onto the skin daily. Remove & Discard patch within 12 hours or as directed by MD 30 patch 0  . Nebulizers (COMPRESSOR/NEBULIZER) MISC 1 application by Does not apply route every 4 (four) hours as needed. 1 each 0  . omeprazole-sodium bicarbonate (ZEGERID) 40-1100 MG capsule Take 1 capsule by mouth daily before breakfast.    . polyethylene glycol (MIRALAX / GLYCOLAX) 17 g packet Take 17 g by mouth daily. 14 each 0  . rivaroxaban (XARELTO) 20 MG TABS tablet Take 1 tablet (20 mg total) by mouth daily with supper. 30 tablet 11  . sildenafil (REVATIO) 20 MG tablet Take 20-60 mg by mouth daily as needed (ED).     . sucralfate (CARAFATE) 1 g tablet Take 1 tablet (1 g total) by mouth 4 (four) times daily -  with meals and at bedtime. Dissolve in 10 mL of warm water prior to swallowing 120 tablet 1  . Tetrahydrozoline HCl (VISINE OP) Place 1 drop into both eyes daily as needed (redness).    . Tiotropium Bromide-Olodaterol (STIOLTO RESPIMAT) 2.5-2.5 MCG/ACT AERS Inhale 2 puffs into the lungs daily. 3 each 3   No current facility-administered medications for this visit.    SURGICAL HISTORY:  Past Surgical History:  Procedure Laterality Date  .  COLONOSCOPY    . HAND SURGERY    . VIDEO BRONCHOSCOPY WITH ENDOBRONCHIAL ULTRASOUND N/A 12/22/2018   Procedure: VIDEO BRONCHOSCOPY WITH ENDOBRONCHIAL ULTRASOUND;  Surgeon: Garner Nash, DO;  Location: [facility];  Service: Thoracic;  Laterality: N/A;  . VIDEO BRONCHOSCOPY WITH ENDOBRONCHIAL ULTRASOUND N/A 01/09/2019   Procedure: VIDEO BRONCHOSCOPY WITH ENDOBRONCHIAL ULTRASOUND WITH FLUORO;  Surgeon: Garner Nash, DO;  Location: [facility];  Service: Thoracic;  Laterality: N/A;  . VIDEO BRONCHOSCOPY WITH RADIAL  ENDOBRONCHIAL ULTRASOUND N/A 12/22/2018   Procedure: RADIAL ENDOBRONCHIAL ULTRASOUND;  Surgeon: Garner Nash, DO;  Location[facility];  Service: Thoracic;  Laterality: N/A;    REVIEW OF SYSTEMS:  Constitutional: negative Eyes: negative Ears, nose, mouth, throat, and face: negative Respiratory: positive for dyspnea on exertion Cardiovascular: negative Gastrointestinal: negative Genitourinary:negative Integument/breast: negative Hematologic/lymphatic: negative Musculoskeletal:negative Neurological: negative Behavioral/Psych: negative Endocrine: negative Allergic/Immunologic: negative   PHYSICAL EXAMINATION: General appearance: alert, cooperative and no distress Head: Normocephalic, without obvious abnormality, atraumatic Neck: no adenopathy, no JVD, supple, symmetrical, trachea midline and thyroid not enlarged, symmetric, no tenderness/mass/nodules Lymph nodes: Cervical, supraclavicular, and axillary nodes normal. Resp: clear to auscultation bilaterally Back: symmetric, no curvature. ROM normal. No CVA tenderness. Cardio: regular rate and rhythm, S1, S2 normal, no murmur, click, rub or gallop GI: soft, non-tender; bowel sounds normal; no masses,  no organomegaly Extremities: extremities normal, atraumatic, no cyanosis or edema Neurologic: Alert and oriented X 3, normal strength and tone. Normal symmetric reflexes. Normal coordination and gait  ECOG PERFORMANCE STATUS: 1 - Symptomatic but completely ambulatory  Blood pressure 96/62, pulse 89, temperature 98.9 F (37.2 C), temperature source Tympanic, resp. rate 17, height 5\' 5"  (1.651 m), weight 188 lb 3.2 oz (85.4 kg), SpO2 96 %.  LABORATORY DATA: Lab Results  Component Value Date   WBC 6.7 01/25/2020   HGB 15.6 01/25/2020   HCT 46.6 01/25/2020   MCV 92.3 01/25/2020   PLT 244 01/25/2020      Chemistry      Component Value Date/Time   NA 139 12/27/2019 1112   K 4.1 12/27/2019 1112   CL 106 12/27/2019 1112   CO2 23  12/27/2019 1112   BUN 21 12/27/2019 1112   CREATININE 0.97 12/27/2019 1112      Component Value Date/Time   CALCIUM 9.3 12/27/2019 1112   ALKPHOS 100 12/27/2019 1112   AST 20 12/27/2019 1112   ALT 21 12/27/2019 1112   BILITOT 0.4 12/27/2019 1112       RADIOGRAPHIC STUDIES: CT Chest W Contrast  Result Date: 01/24/2020 CLINICAL DATA:  Non-small cell lung cancer. Chemotherapy and radiation therapy completed approximately 10 months ago. Ongoing immunotherapy. Chronic shortness of breath and cough. EXAM: CT CHEST WITH CONTRAST TECHNIQUE: Multidetector CT imaging of the chest was performed during intravenous contrast administration. CONTRAST:  46mL OMNIPAQUE IOHEXOL 300 MG/ML  SOLN COMPARISON:  CT 10/06/2019 and 07/10/2019. FINDINGS: Cardiovascular: Atherosclerosis of the aorta, great vessels and coronary arteries. The pulmonary arteries are suboptimally opacified, although appear patent. No acute vascular findings. The heart size is normal. There is no pericardial effusion. Mediastinum/Nodes: Scattered small mediastinal lymph nodes are stable, including a 9 mm high left paratracheal node on image 37/2 and a 9 mm AP window node on image 58/2. There are no enlarging mediastinal, hilar or axillary lymph nodes. The thyroid gland, trachea and esophagus demonstrate no significant findings. Lungs/Pleura: No pleural effusion or pneumothorax. Moderate to severe centrilobular and paraseptal emphysema again noted with diffuse central airway thickening. There are  chronic radiation changes in the paramediastinal lungs bilaterally which are unchanged. No focal nodule is seen in the right lower lobe at the area questioned previously. No new or enlarging pulmonary nodules identified. Upper abdomen: The visualized upper abdomen appears stable without significant findings. There are stable small left larger than right adrenal adenomas. Musculoskeletal/Chest wall: There is no chest wall mass or suspicious osseous finding.  IMPRESSION: 1. Stable chronic radiation changes in the paramediastinal lungs bilaterally. No evidence of local recurrence or metastatic disease. No acute chest findings. 2. Stable small mediastinal lymph nodes, likely reactive. 3. Stable small left larger than right adrenal adenomas. 4. Aortic Atherosclerosis (ICD10-I70.0) and Emphysema (ICD10-J43.9). Electronically Signed   By: Richardean Sale M.D.   On: 01/24/2020 10:28    ASSESSMENT AND PLAN: This is a very pleasant 64 years old white male with a stage IIIc non-small cell lung cancer, adenocarcinoma diagnosed in December 2020 The patient completed a course of concurrent chemoradiation with weekly carboplatin and paclitaxel status post 6 cycles and he tolerated his treatment well and has partial response. He is currently undergoing consolidation treatment with immunotherapy with Imfinzi 1500 mg IV every 4 weeks status post 9 cycles. The patient continues to tolerate his treatment with Imfinzi fairly well with no concerning adverse effects. He had repeat CT scan of the chest performed recently.  I personally and independently reviewed the scan and discussed the results with the patient today. Has a scan showed no concerning findings for disease progression. I recommended for the patient to continue his current treatment with Imfinzi and he will proceed with cycle #10 today as planned. The patient will come back for follow-up visit in 4 weeks for evaluation before the next cycle of his treatment. He was advised to call immediately if he has any concerning symptoms in the interval. The patient voices understanding of current disease status and treatment options and is in agreement with the current care plan. All questions were answered. The patient knows to call the clinic with any problems, questions or concerns. We can certainly see the patient much sooner if necessary.  Disclaimer: This note was dictated with voice recognition software. Similar  sounding words can inadvertently be transcribed and may not be corrected upon review.

## 2020-01-25 NOTE — Patient Instructions (Signed)
[facility] Discharge Instructions for Patients Receiving Chemotherapy  Today you received the following chemotherapy agents:  Durvalumab (Imfinzi)  To help prevent nausea and vomiting after your treatment, we encourage you to take your nausea medication as prescribed.   If you develop nausea and vomiting that is not controlled by your nausea medication, call the clinic.   BELOW ARE SYMPTOMS THAT SHOULD BE REPORTED IMMEDIATELY:  *FEVER GREATER THAN 100.5 F  *CHILLS WITH OR WITHOUT FEVER  NAUSEA AND VOMITING THAT IS NOT CONTROLLED WITH YOUR NAUSEA MEDICATION  *UNUSUAL SHORTNESS OF BREATH  *UNUSUAL BRUISING OR BLEEDING  TENDERNESS IN MOUTH AND THROAT WITH OR WITHOUT PRESENCE OF ULCERS  *URINARY PROBLEMS  *BOWEL PROBLEMS  UNUSUAL RASH Items with * indicate a potential emergency and should be followed up as soon as possible.  Feel free to call the clinic should you have any questions or concerns. The clinic phone number is [phone].  Please show the CHEMO ALERT CARD at check-in to the Emergency Department and triage nurse.   

## 2020-01-30 ENCOUNTER — Telehealth: Payer: Self-pay | Admitting: Internal Medicine

## 2020-01-30 NOTE — Telephone Encounter (Signed)
Scheduled appts per 12/23 los. Pt to get updated appt calendar at next visit per appt notes.

## 2020-01-31 ENCOUNTER — Other Ambulatory Visit: Payer: Self-pay | Admitting: Internal Medicine

## 2020-01-31 DIAGNOSIS — R1310 Dysphagia, unspecified: Secondary | ICD-10-CM

## 2020-02-22 ENCOUNTER — Inpatient Hospital Stay: Payer: No Typology Code available for payment source

## 2020-02-22 ENCOUNTER — Other Ambulatory Visit: Payer: Self-pay

## 2020-02-22 ENCOUNTER — Inpatient Hospital Stay: Payer: No Typology Code available for payment source | Attending: Internal Medicine | Admitting: Internal Medicine

## 2020-02-22 VITALS — BP 107/75 | HR 90 | Temp 97.9°F | Resp 17 | Ht 65.0 in | Wt 188.9 lb

## 2020-02-22 DIAGNOSIS — F17210 Nicotine dependence, cigarettes, uncomplicated: Secondary | ICD-10-CM | POA: Insufficient documentation

## 2020-02-22 DIAGNOSIS — Z5112 Encounter for antineoplastic immunotherapy: Secondary | ICD-10-CM | POA: Insufficient documentation

## 2020-02-22 DIAGNOSIS — C3491 Malignant neoplasm of unspecified part of right bronchus or lung: Secondary | ICD-10-CM | POA: Diagnosis not present

## 2020-02-22 DIAGNOSIS — I700 Atherosclerosis of aorta: Secondary | ICD-10-CM | POA: Insufficient documentation

## 2020-02-22 DIAGNOSIS — E785 Hyperlipidemia, unspecified: Secondary | ICD-10-CM | POA: Diagnosis not present

## 2020-02-22 LAB — CBC WITH DIFFERENTIAL (CANCER CENTER ONLY)
Abs Immature Granulocytes: 0.02 10*3/uL (ref 0.00–0.07)
Basophils Absolute: 0 10*3/uL (ref 0.0–0.1)
Basophils Relative: 1 %
Eosinophils Absolute: 0.2 10*3/uL (ref 0.0–0.5)
Eosinophils Relative: 4 %
HCT: 44.8 % (ref 39.0–52.0)
Hemoglobin: 15.4 g/dL (ref 13.0–17.0)
Immature Granulocytes: 0 %
Lymphocytes Relative: 17 %
Lymphs Abs: 0.9 10*3/uL (ref 0.7–4.0)
MCH: 31.4 pg (ref 26.0–34.0)
MCHC: 34.4 g/dL (ref 30.0–36.0)
MCV: 91.4 fL (ref 80.0–100.0)
Monocytes Absolute: 0.6 10*3/uL (ref 0.1–1.0)
Monocytes Relative: 11 %
Neutro Abs: 3.4 10*3/uL (ref 1.7–7.7)
Neutrophils Relative %: 67 %
Platelet Count: 196 10*3/uL (ref 150–400)
RBC: 4.9 MIL/uL (ref 4.22–5.81)
RDW: 12.8 % (ref 11.5–15.5)
WBC Count: 5.2 10*3/uL (ref 4.0–10.5)
nRBC: 0 % (ref 0.0–0.2)

## 2020-02-22 LAB — CMP (CANCER CENTER ONLY)
ALT: 27 U/L (ref 0–44)
AST: 20 U/L (ref 15–41)
Albumin: 3.7 g/dL (ref 3.5–5.0)
Alkaline Phosphatase: 94 U/L (ref 38–126)
Anion gap: 10 (ref 5–15)
BUN: 20 mg/dL (ref 8–23)
CO2: 22 mmol/L (ref 22–32)
Calcium: 9.3 mg/dL (ref 8.9–10.3)
Chloride: 107 mmol/L (ref 98–111)
Creatinine: 0.96 mg/dL (ref 0.61–1.24)
GFR, Estimated: >60 mL/min (ref >60)
Glucose, Bld: 99 mg/dL (ref 70–99)
Potassium: 4.1 mmol/L (ref 3.5–5.1)
Sodium: 139 mmol/L (ref 135–145)
Total Bilirubin: 0.4 mg/dL (ref 0.3–1.2)
Total Protein: 7.4 g/dL (ref 6.5–8.1)

## 2020-02-22 LAB — TSH: TSH: 2.471 u[IU]/mL (ref 0.320–4.118)

## 2020-02-22 MED ORDER — SODIUM CHLORIDE 0.9 % IV SOLN
1500.0000 mg | Freq: Once | INTRAVENOUS | Status: AC
  Administered 2020-02-22: 1500 mg via INTRAVENOUS
  Filled 2020-02-22: qty 30

## 2020-02-22 MED ORDER — SODIUM CHLORIDE 0.9 % IV SOLN
Freq: Once | INTRAVENOUS | Status: AC
  Filled 2020-02-22: qty 250

## 2020-02-22 NOTE — Progress Notes (Signed)
[facility] Telephone:(336) [phone]   Fax:(336) [phone]  OFFICE PROGRESS NOTE  [name], MD [address] [place] [address]  DIAGNOSIS: Stage IIIc (T4,N3, M0)non-small cell lung cancer, adenocarcinoma presented with large right hilar mass in addition to bilateral hilar and mediastinal lymphadenopathy diagnosed in December 2020.  PRIOR THERAPY: Weekly concurrent chemoradiation with carboplatin for an AUC of 2 and paclitaxel 45 mg/m2. First dose starting 01/30/2019.   Status post 6 cycles.  CURRENT THERAPY: Consolidation immunotherapy with Imfinzi 1500 mg IV every 4 weeks. First dose expected on 04/19/2019.  Status post 10 cycles.  INTERVAL HISTORY: Philip [name] 65 y.o. male returns to the clinic today for follow-up visit.  The patient is feeling fine today with no concerning complaints.  He denied having any current chest pain but has shortness of breath with exertion with no cough or hemoptysis.  He denied having any recent weight loss or night sweats.  He has no nausea, vomiting, diarrhea or constipation.  He has no headache or visual changes.  The patient is here today for evaluation before starting cycle #11 of his treatment.  MEDICAL HISTORY: Past Medical History:  Diagnosis Date  . Hyperlipidemia   . lung ca dx'd 12/2018  . Pneumonia   . Tobacco abuse     ALLERGIES:  is allergic to norco [hydrocodone-acetaminophen].  MEDICATIONS:  Current Outpatient Medications  Medication Sig Dispense Refill  . albuterol (PROVENTIL) (2.5 MG/3ML) 0.083% nebulizer solution Take 3 mLs (2.5 mg total) by nebulization every 6 (six) hours as needed for wheezing or shortness of breath. 75 mL 12  . albuterol (VENTOLIN HFA) 108 (90 Base) MCG/ACT inhaler Inhale 2 puffs into the lungs every 4 (four) hours as needed for wheezing or shortness of breath. 18 g 6  . atorvastatin (LIPITOR) 20 MG tablet Take 20 mg by mouth daily.    Marland Kitchen bismuth subsalicylate  (PEPTO BISMOL) 262 MG/15ML suspension Take 30 mLs by mouth every 6 (six) hours as needed for indigestion.    . famotidine (PEPCID) 20 MG tablet TAKE 1 TABLET BY MOUTH TWICE A DAY 180 tablet 1  . lidocaine (LIDODERM) 5 % Place 1 patch onto the skin daily. Remove & Discard patch within 12 hours or as directed by MD 30 patch 0  . Nebulizers (COMPRESSOR/NEBULIZER) MISC 1 application by Does not apply route every 4 (four) hours as needed. 1 each 0  . omeprazole-sodium bicarbonate (ZEGERID) 40-1100 MG capsule Take 1 capsule by mouth daily before breakfast.    . polyethylene glycol (MIRALAX / GLYCOLAX) 17 g packet Take 17 g by mouth daily. 14 each 0  . rivaroxaban (XARELTO) 20 MG TABS tablet Take 1 tablet (20 mg total) by mouth daily with supper. 30 tablet 11  . sildenafil (REVATIO) 20 MG tablet Take 20-60 mg by mouth daily as needed (ED).     . sucralfate (CARAFATE) 1 g tablet Take 1 tablet (1 g total) by mouth 4 (four) times daily -  with meals and at bedtime. Dissolve in 10 mL of warm water prior to swallowing 120 tablet 1  . Tetrahydrozoline HCl (VISINE OP) Place 1 drop into both eyes daily as needed (redness).    . Tiotropium Bromide-Olodaterol (STIOLTO RESPIMAT) 2.5-2.5 MCG/ACT AERS Inhale 2 puffs into the lungs daily. 3 each 3   No current facility-administered medications for this visit.    SURGICAL HISTORY:  Past Surgical History:  Procedure Laterality Date  . COLONOSCOPY    .  HAND SURGERY    . VIDEO BRONCHOSCOPY WITH ENDOBRONCHIAL ULTRASOUND N/A 12/22/2018   Procedure: VIDEO BRONCHOSCOPY WITH ENDOBRONCHIAL ULTRASOUND;  Surgeon: Garner Nash, DO;  Location: [facility];  Service: Thoracic;  Laterality: N/A;  . VIDEO BRONCHOSCOPY WITH ENDOBRONCHIAL ULTRASOUND N/A 01/09/2019   Procedure: VIDEO BRONCHOSCOPY WITH ENDOBRONCHIAL ULTRASOUND WITH FLUORO;  Surgeon: Garner Nash, DO;  Location: [facility];  Service: Thoracic;  Laterality: N/A;  . VIDEO BRONCHOSCOPY WITH RADIAL ENDOBRONCHIAL ULTRASOUND  N/A 12/22/2018   Procedure: RADIAL ENDOBRONCHIAL ULTRASOUND;  Surgeon: Garner Nash, DO;  Location: [facility];  Service: Thoracic;  Laterality: N/A;    REVIEW OF SYSTEMS:  A comprehensive review of systems was negative except for: Respiratory: positive for dyspnea on exertion   PHYSICAL EXAMINATION: General appearance: alert, cooperative and no distress Head: Normocephalic, without obvious abnormality, atraumatic Neck: no adenopathy, no JVD, supple, symmetrical, trachea midline and thyroid not enlarged, symmetric, no tenderness/mass/nodules Lymph nodes: Cervical, supraclavicular, and axillary nodes normal. Resp: clear to auscultation bilaterally Back: symmetric, no curvature. ROM normal. No CVA tenderness. Cardio: regular rate and rhythm, S1, S2 normal, no murmur, click, rub or gallop GI: soft, non-tender; bowel sounds normal; no masses,  no organomegaly Extremities: extremities normal, atraumatic, no cyanosis or edema  ECOG PERFORMANCE STATUS: 1 - Symptomatic but completely ambulatory  Blood pressure 107/75, pulse 90, temperature 97.9 F (36.6 C), temperature source Tympanic, resp. rate 17, height 5\' 5"  (1.651 m), weight 188 lb 14.4 oz (85.7 kg), SpO2 98 %.  LABORATORY DATA: Lab Results  Component Value Date   WBC 5.2 02/22/2020   HGB 15.4 02/22/2020   HCT 44.8 02/22/2020   MCV 91.4 02/22/2020   PLT 196 02/22/2020      Chemistry      Component Value Date/Time   NA 136 01/25/2020 0843   K 4.4 01/25/2020 0843   CL 104 01/25/2020 0843   CO2 24 01/25/2020 0843   BUN 17 01/25/2020 0843   CREATININE 1.00 01/25/2020 0843      Component Value Date/Time   CALCIUM 9.4 01/25/2020 0843   ALKPHOS 98 01/25/2020 0843   AST 20 01/25/2020 0843   ALT 23 01/25/2020 0843   BILITOT 0.7 01/25/2020 0843       RADIOGRAPHIC STUDIES: CT Chest W Contrast  Result Date: 01/24/2020 CLINICAL DATA:  Non-small cell lung cancer. Chemotherapy and radiation therapy completed approximately 10  months ago. Ongoing immunotherapy. Chronic shortness of breath and cough. EXAM: CT CHEST WITH CONTRAST TECHNIQUE: Multidetector CT imaging of the chest was performed during intravenous contrast administration. CONTRAST:  68mL OMNIPAQUE IOHEXOL 300 MG/ML  SOLN COMPARISON:  CT 10/06/2019 and 07/10/2019. FINDINGS: Cardiovascular: Atherosclerosis of the aorta, great vessels and coronary arteries. The pulmonary arteries are suboptimally opacified, although appear patent. No acute vascular findings. The heart size is normal. There is no pericardial effusion. Mediastinum/Nodes: Scattered small mediastinal lymph nodes are stable, including a 9 mm high left paratracheal node on image 37/2 and a 9 mm AP window node on image 58/2. There are no enlarging mediastinal, hilar or axillary lymph nodes. The thyroid gland, trachea and esophagus demonstrate no significant findings. Lungs/Pleura: No pleural effusion or pneumothorax. Moderate to severe centrilobular and paraseptal emphysema again noted with diffuse central airway thickening. There are chronic radiation changes in the paramediastinal lungs bilaterally which are unchanged. No focal nodule is seen in the right lower lobe at the area questioned previously. No new or enlarging pulmonary nodules identified. Upper abdomen: The visualized upper abdomen appears stable without  significant findings. There are stable small left larger than right adrenal adenomas. Musculoskeletal/Chest wall: There is no chest wall mass or suspicious osseous finding. IMPRESSION: 1. Stable chronic radiation changes in the paramediastinal lungs bilaterally. No evidence of local recurrence or metastatic disease. No acute chest findings. 2. Stable small mediastinal lymph nodes, likely reactive. 3. Stable small left larger than right adrenal adenomas. 4. Aortic Atherosclerosis (ICD10-I70.0) and Emphysema (ICD10-J43.9). Electronically Signed   By: Richardean Sale M.D.   On: 01/24/2020 10:28    ASSESSMENT  AND PLAN: This is a very pleasant 65 years old white male with a stage IIIc non-small cell lung cancer, adenocarcinoma diagnosed in December 2020 The patient completed a course of concurrent chemoradiation with weekly carboplatin and paclitaxel status post 6 cycles and he tolerated his treatment well and has partial response. He is currently undergoing consolidation treatment with immunotherapy with Imfinzi 1500 mg IV every 4 weeks status post 10 cycles. The patient continues to tolerate his treatment fairly well with no concerning adverse effects. I recommended for him to proceed with cycle #11 today as planned. The patient will come back for follow-up visit in 4 weeks for evaluation before starting cycle #12. He was advised to call immediately if he has any other concerning symptoms in the interval. The patient voices understanding of current disease status and treatment options and is in agreement with the current care plan. All questions were answered. The patient knows to call the clinic with any problems, questions or concerns. We can certainly see the patient much sooner if necessary.  Disclaimer: This note was dictated with voice recognition software. Similar sounding words can inadvertently be transcribed and may not be corrected upon review.

## 2020-02-22 NOTE — Patient Instructions (Signed)
[facility] Discharge Instructions for Patients Receiving Chemotherapy  Today you received the following chemotherapy agents: Imfinzi.  To help prevent nausea and vomiting after your treatment, we encourage you to take your nausea medication as directed.   If you develop nausea and vomiting that is not controlled by your nausea medication, call the clinic.   BELOW ARE SYMPTOMS THAT SHOULD BE REPORTED IMMEDIATELY:  *FEVER GREATER THAN 100.5 F  *CHILLS WITH OR WITHOUT FEVER  NAUSEA AND VOMITING THAT IS NOT CONTROLLED WITH YOUR NAUSEA MEDICATION  *UNUSUAL SHORTNESS OF BREATH  *UNUSUAL BRUISING OR BLEEDING  TENDERNESS IN MOUTH AND THROAT WITH OR WITHOUT PRESENCE OF ULCERS  *URINARY PROBLEMS  *BOWEL PROBLEMS  UNUSUAL RASH Items with * indicate a potential emergency and should be followed up as soon as possible.  Feel free to call the clinic should you have any questions or concerns. The clinic phone number is [phone].  Please show the CHEMO ALERT CARD at check-in to the Emergency Department and triage nurse.   

## 2020-02-27 ENCOUNTER — Telehealth: Payer: Self-pay | Admitting: Internal Medicine

## 2020-02-27 NOTE — Telephone Encounter (Signed)
Per 1/20 los, next appts already scheduled

## 2020-03-03 IMAGING — MR MR HEAD WO/W CM
9 of 13 series · 32 of 48 positions shown · IV contrast (gadavist)
Comparison: 5646, no recent imaging available

CLINICAL DATA: Lung cancer, initial staging

EXAM:
MRI HEAD WITHOUT AND WITH CONTRAST
TECHNIQUE: Multiplanar, multiecho pulse sequences of the brain and surrounding
structures were obtained without and with intravenous contrast.
CONTRAST:  8mL GADAVIST GADOBUTROL 1 MMOL/ML IV SOLN

[Series 4: DWI · axial · 3.0mm · 1.09mm/px · z∈[-26,+115]mm · 7 of 98 slices shown (1 of 4)]
[im 1/98]
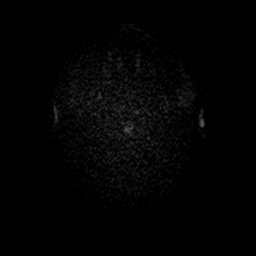
[im 17/98]
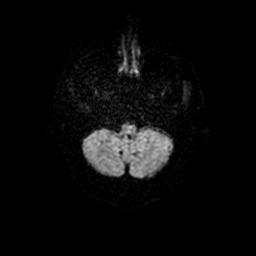
[im 33/98]
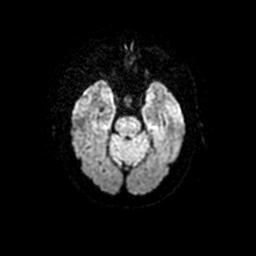
[im 49/98]
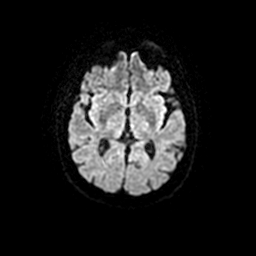
[im 65/98]
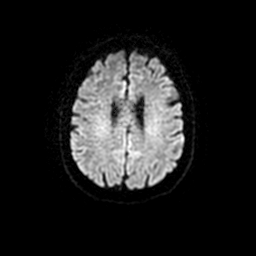
[im 81/98]
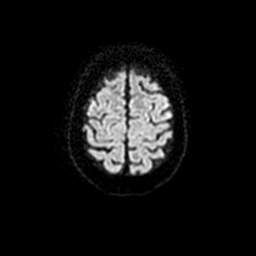
[im 98/98]
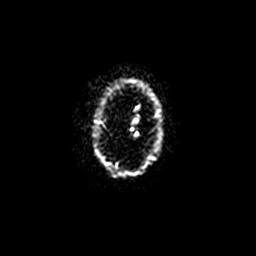

[Series 5: T2 · axial · 5.0mm · 0.43mm/px · 1 of 22 slices shown]
[im 1/22]
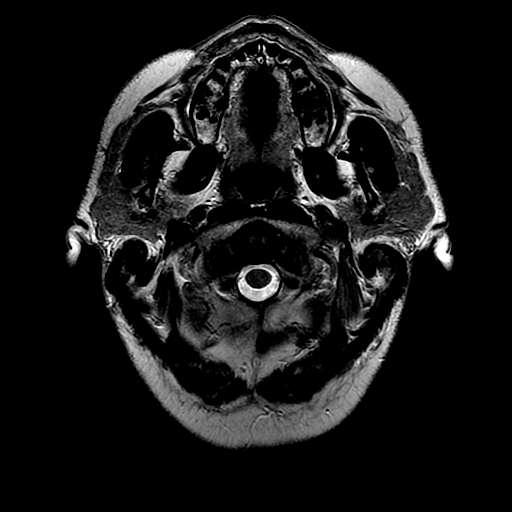

[Series 6: FLAIR · axial · 3.0mm · 0.43mm/px · z∈[-33,+114]mm · 2 of 26 slices shown]
[im 1/26]
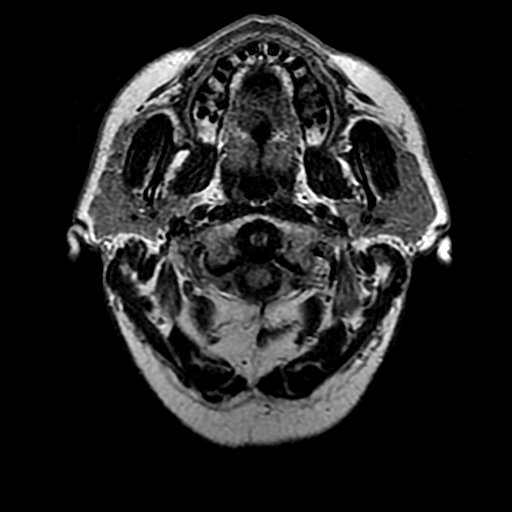
[im 26/26]
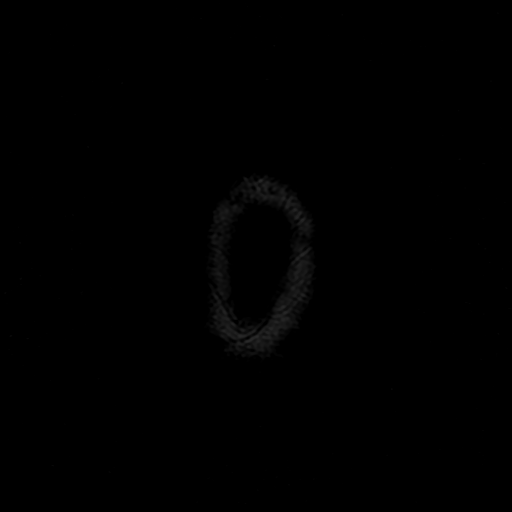

[Series 9: DWI · coronal · 4.0mm · 1.09mm/px · 5 of 82 slices shown (2 of 4)]
[im 1/82]
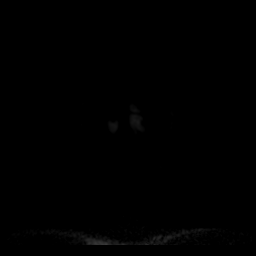
[im 21/82]
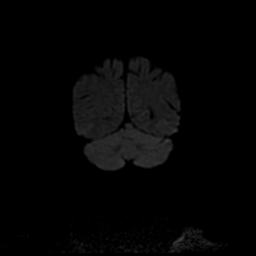
[im 41/82]
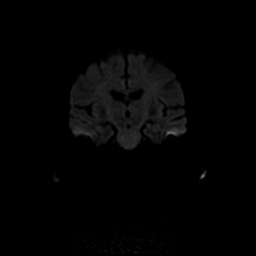
[im 61/82]
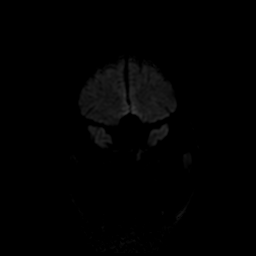
[im 82/82]
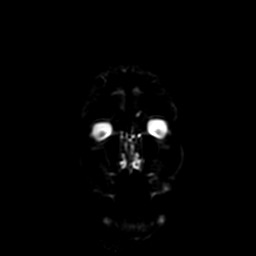

[Series 11: T1 post-contrast · axial · 1.0mm · 0.47mm/px · z∈[-25,+110]mm · 8 of 140 slices shown (1 of 3)]
[im 1/140]
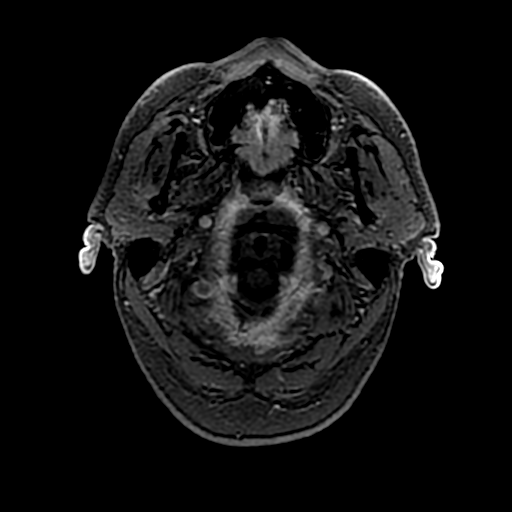
[im 18/140]
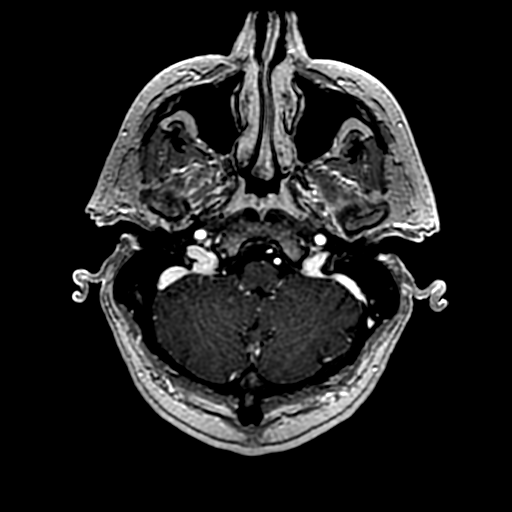
[im 35/140]
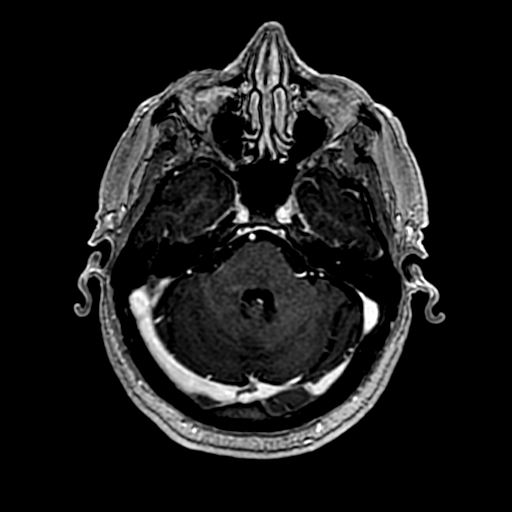
[im 53/140]
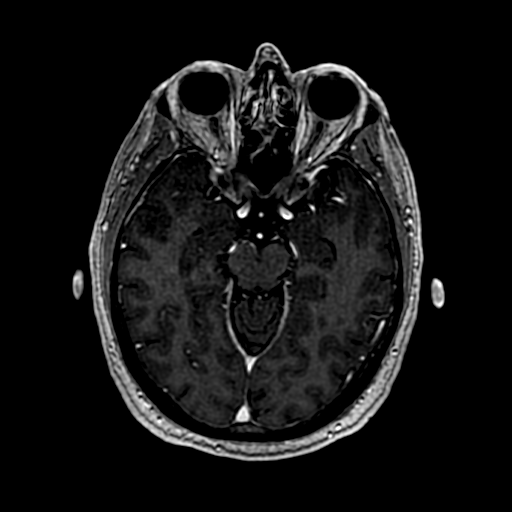
[im 87/140]
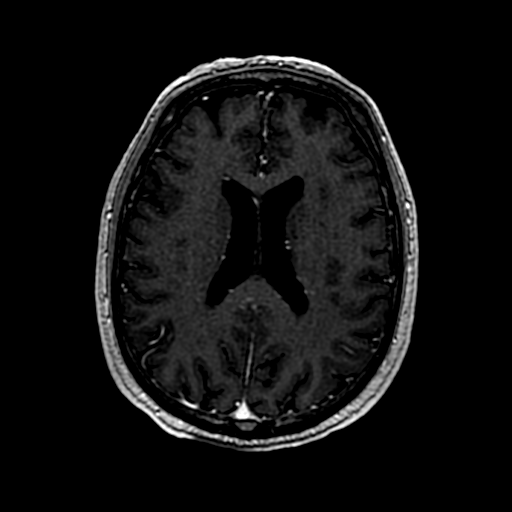
[im 105/140]
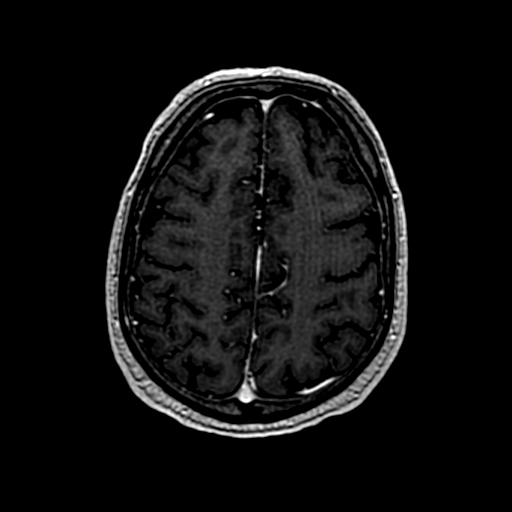
[im 122/140]
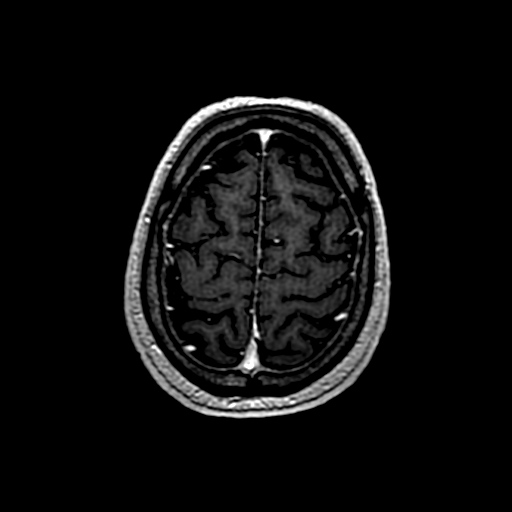
[im 140/140]
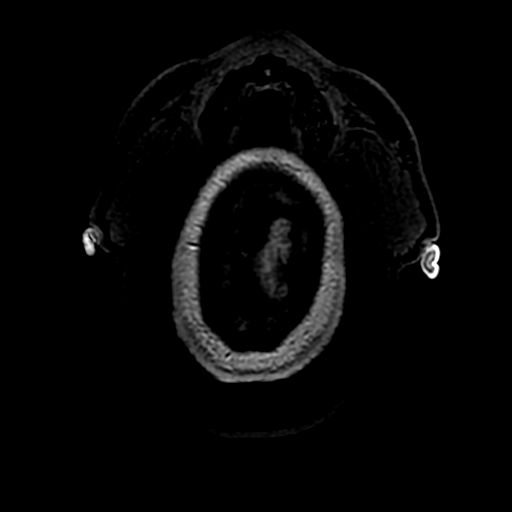

[Series 12: T1 post-contrast · coronal · 5.0mm · 0.43mm/px · 2 of 26 slices shown (2 of 3)]
[im 1/26]
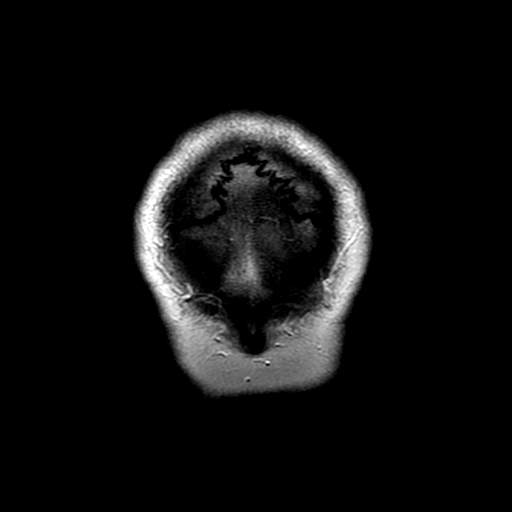
[im 26/26]
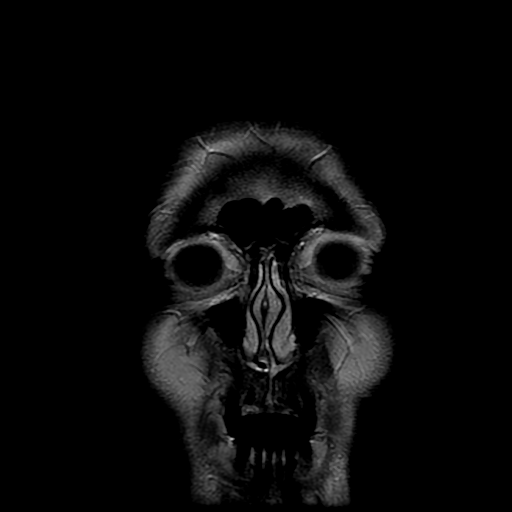

[Series 13: T1 post-contrast · sagittal · 5.0mm · 0.47mm/px · 1 of 21 slices shown (3 of 3)]
[im 1/21]
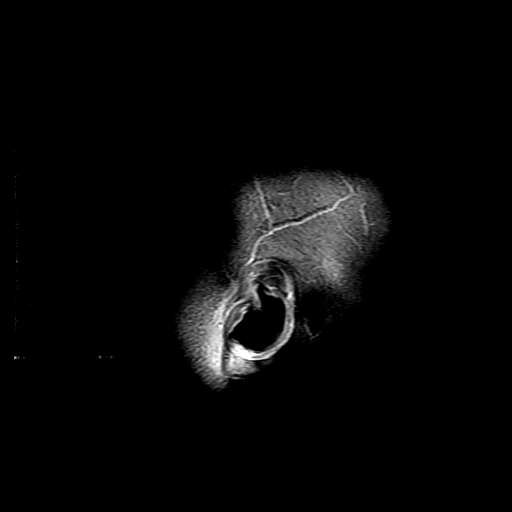

[Series 400: DWI · axial · 3.0mm · 1.09mm/px · z∈[-26,+115]mm · 3 of 48 slices shown (3 of 4)]
[im 1/48]
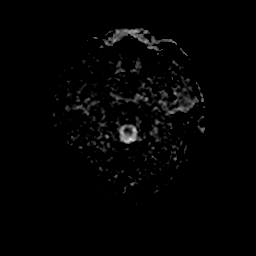
[im 24/48]
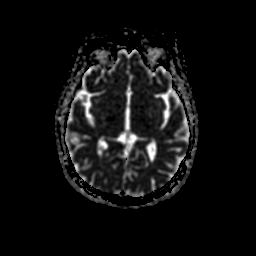
[im 48/48]
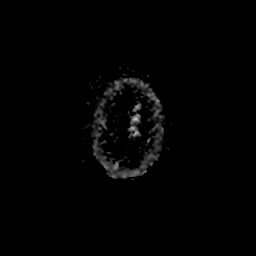

[Series 900: DWI · coronal · 4.0mm · 1.09mm/px · 3 of 41 slices shown (4 of 4)]
[im 1/41]
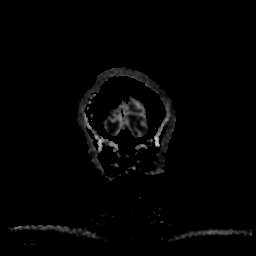
[im 21/41]
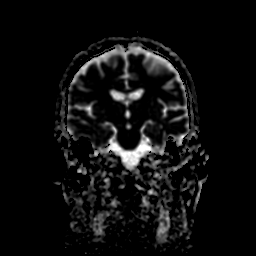
[im 41/41]
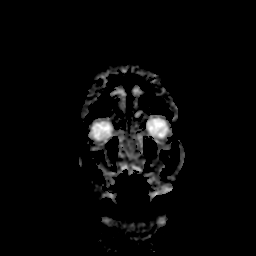

[32 of 48 positions shown; findings below may reference images not displayed]

FINDINGS: Brain: There is no acute infarction or intracranial hemorrhage.
There is no intracranial mass, mass effect, or edema. There is no
hydrocephalus or extra-axial fluid collection. Minimal patchy foci
of T2 hyperintensity in the supratentorial white matter are
nonspecific and may reflect minor chronic microvascular ischemic
changes. No abnormal enhancement.

Vascular: Major vessel flow voids at the skull base are preserved.

Skull and upper cervical spine: Normal marrow signal is preserved.

Sinuses/Orbits: Mild mucosal thickening.  Orbits are unremarkable.

Other: Sella is unremarkable.  Mastoid air cells are clear.
IMPRESSION: No evidence of metastatic disease.

## 2020-03-07 ENCOUNTER — Other Ambulatory Visit: Payer: Self-pay | Admitting: Critical Care Medicine

## 2020-03-07 DIAGNOSIS — J449 Chronic obstructive pulmonary disease, unspecified: Secondary | ICD-10-CM

## 2020-03-14 ENCOUNTER — Encounter (HOSPITAL_COMMUNITY): Payer: Self-pay

## 2020-03-14 ENCOUNTER — Other Ambulatory Visit: Payer: Self-pay

## 2020-03-14 ENCOUNTER — Emergency Department (HOSPITAL_COMMUNITY): Payer: No Typology Code available for payment source

## 2020-03-14 ENCOUNTER — Telehealth: Payer: Self-pay | Admitting: Medical Oncology

## 2020-03-14 ENCOUNTER — Inpatient Hospital Stay (HOSPITAL_COMMUNITY)
Admission: EM | Admit: 2020-03-14 | Discharge: 2020-03-17 | DRG: 175 | Disposition: A | Discharge status: Home / Self Care | Payer: No Typology Code available for payment source | Attending: Family Medicine | Admitting: Family Medicine

## 2020-03-14 DIAGNOSIS — I444 Left anterior fascicular block: Secondary | ICD-10-CM | POA: Diagnosis present

## 2020-03-14 DIAGNOSIS — I2609 Other pulmonary embolism with acute cor pulmonale: Secondary | ICD-10-CM

## 2020-03-14 DIAGNOSIS — R0902 Hypoxemia: Secondary | ICD-10-CM

## 2020-03-14 DIAGNOSIS — C3491 Malignant neoplasm of unspecified part of right bronchus or lung: Secondary | ICD-10-CM | POA: Diagnosis not present

## 2020-03-14 DIAGNOSIS — I2693 Single subsegmental pulmonary embolism without acute cor pulmonale: Principal | ICD-10-CM | POA: Diagnosis present

## 2020-03-14 DIAGNOSIS — Z86711 Personal history of pulmonary embolism: Secondary | ICD-10-CM | POA: Diagnosis present

## 2020-03-14 DIAGNOSIS — Z87891 Personal history of nicotine dependence: Secondary | ICD-10-CM

## 2020-03-14 DIAGNOSIS — J449 Chronic obstructive pulmonary disease, unspecified: Secondary | ICD-10-CM | POA: Diagnosis present

## 2020-03-14 DIAGNOSIS — Z20822 Contact with and (suspected) exposure to covid-19: Secondary | ICD-10-CM | POA: Diagnosis present

## 2020-03-14 DIAGNOSIS — Z9221 Personal history of antineoplastic chemotherapy: Secondary | ICD-10-CM

## 2020-03-14 DIAGNOSIS — J9601 Acute respiratory failure with hypoxia: Secondary | ICD-10-CM | POA: Diagnosis present

## 2020-03-14 DIAGNOSIS — Z9981 Dependence on supplemental oxygen: Secondary | ICD-10-CM

## 2020-03-14 DIAGNOSIS — E785 Hyperlipidemia, unspecified: Secondary | ICD-10-CM | POA: Diagnosis present

## 2020-03-14 DIAGNOSIS — I2782 Chronic pulmonary embolism: Secondary | ICD-10-CM | POA: Diagnosis present

## 2020-03-14 DIAGNOSIS — I700 Atherosclerosis of aorta: Secondary | ICD-10-CM | POA: Diagnosis present

## 2020-03-14 DIAGNOSIS — Z7901 Long term (current) use of anticoagulants: Secondary | ICD-10-CM

## 2020-03-14 DIAGNOSIS — H9319 Tinnitus, unspecified ear: Secondary | ICD-10-CM | POA: Diagnosis present

## 2020-03-14 DIAGNOSIS — Z885 Allergy status to narcotic agent status: Secondary | ICD-10-CM

## 2020-03-14 DIAGNOSIS — I2699 Other pulmonary embolism without acute cor pulmonale: Secondary | ICD-10-CM | POA: Diagnosis present

## 2020-03-14 DIAGNOSIS — Z79899 Other long term (current) drug therapy: Secondary | ICD-10-CM

## 2020-03-14 HISTORY — DX: Other pulmonary embolism without acute cor pulmonale: I26.99

## 2020-03-14 LAB — CBC
HCT: 42.9 % (ref 39.0–52.0)
Hemoglobin: 14.7 g/dL (ref 13.0–17.0)
MCH: 31.8 pg (ref 26.0–34.0)
MCHC: 34.3 g/dL (ref 30.0–36.0)
MCV: 92.9 fL (ref 80.0–100.0)
Platelets: 176 10*3/uL (ref 150–400)
RBC: 4.62 MIL/uL (ref 4.22–5.81)
RDW: 12.7 % (ref 11.5–15.5)
WBC: 10 10*3/uL (ref 4.0–10.5)
nRBC: 0 % (ref 0.0–0.2)

## 2020-03-14 LAB — RESP PANEL BY RT-PCR (FLU A&B, COVID) ARPGX2
Influenza A by PCR: NEGATIVE
Influenza B by PCR: NEGATIVE
SARS Coronavirus 2 by RT PCR: NEGATIVE

## 2020-03-14 LAB — BASIC METABOLIC PANEL
Anion gap: 10 (ref 5–15)
BUN: 15 mg/dL (ref 8–23)
CO2: 22 mmol/L (ref 22–32)
Calcium: 8.7 mg/dL — ABNORMAL LOW (ref 8.9–10.3)
Chloride: 101 mmol/L (ref 98–111)
Creatinine, Ser: 0.86 mg/dL (ref 0.61–1.24)
GFR, Estimated: >60 mL/min (ref >60)
Glucose, Bld: 108 mg/dL — ABNORMAL HIGH (ref 70–99)
Potassium: 4.1 mmol/L (ref 3.5–5.1)
Sodium: 133 mmol/L — ABNORMAL LOW (ref 135–145)

## 2020-03-14 LAB — TROPONIN I (HIGH SENSITIVITY)
Troponin I (High Sensitivity): 4 ng/L (ref <18)
Troponin I (High Sensitivity): 5 ng/L (ref <18)

## 2020-03-14 LAB — HEPARIN LEVEL (UNFRACTIONATED): Heparin Unfractionated: 0.4 IU/mL (ref 0.30–0.70)

## 2020-03-14 LAB — APTT: aPTT: 118 seconds — ABNORMAL HIGH (ref 24–36)

## 2020-03-14 MED ORDER — SODIUM CHLORIDE 0.9% FLUSH: 3.0000 mL | Freq: Two times a day (BID) | INTRAVENOUS | Status: DC

## 2020-03-14 MED ORDER — FAMOTIDINE 20 MG PO TABS
20.0000 mg | ORAL_TABLET | Freq: Two times a day (BID) | ORAL | Status: DC
  Administered 2020-03-14 – 2020-03-17 (×7): 20 mg via ORAL
  Filled 2020-03-14 (×7): qty 1

## 2020-03-14 MED ORDER — HYDROCODONE-ACETAMINOPHEN 5-325 MG PO TABS
1.0000 | ORAL_TABLET | ORAL | Status: DC | PRN
  Administered 2020-03-14 – 2020-03-17 (×7): 2 via ORAL
  Filled 2020-03-14 (×7): qty 2

## 2020-03-14 MED ORDER — MORPHINE SULFATE (PF) 2 MG/ML IV SOLN
2.0000 mg | INTRAVENOUS | Status: DC | PRN
  Administered 2020-03-14 – 2020-03-15 (×4): 2 mg via INTRAVENOUS
  Filled 2020-03-14 (×4): qty 1

## 2020-03-14 MED ORDER — HYDROMORPHONE HCL 1 MG/ML IJ SOLN: 1.0000 mg | Freq: Once | INTRAMUSCULAR | Status: DC

## 2020-03-14 MED ORDER — ACETAMINOPHEN 650 MG RE SUPP: 650.0000 mg | Freq: Four times a day (QID) | RECTAL | Status: DC | PRN

## 2020-03-14 MED ORDER — BENZONATATE 100 MG PO CAPS
200.0000 mg | ORAL_CAPSULE | Freq: Once | ORAL | Status: AC
  Administered 2020-03-14: 200 mg via ORAL
  Filled 2020-03-14: qty 2

## 2020-03-14 MED ORDER — POLYETHYLENE GLYCOL 3350 17 G PO PACK: 17.0000 g | PACK | Freq: Every day | ORAL | Status: DC | PRN

## 2020-03-14 MED ORDER — UMECLIDINIUM BROMIDE 62.5 MCG/INH IN AEPB
2.0000 | INHALATION_SPRAY | Freq: Every day | RESPIRATORY_TRACT | Status: DC
  Administered 2020-03-15 – 2020-03-17 (×3): 2 via RESPIRATORY_TRACT
  Filled 2020-03-14: qty 7

## 2020-03-14 MED ORDER — HEPARIN BOLUS VIA INFUSION
5000.0000 [IU] | Freq: Once | INTRAVENOUS | Status: AC
  Administered 2020-03-14: 5000 [IU] via INTRAVENOUS
  Filled 2020-03-14: qty 5000

## 2020-03-14 MED ORDER — LORATADINE 10 MG PO TABS
10.0000 mg | ORAL_TABLET | Freq: Every day | ORAL | Status: DC
  Administered 2020-03-14 – 2020-03-17 (×4): 10 mg via ORAL
  Filled 2020-03-14 (×4): qty 1

## 2020-03-14 MED ORDER — HEPARIN (PORCINE) 25000 UT/250ML-% IV SOLN
1500.0000 [IU]/h | INTRAVENOUS | Status: DC
  Administered 2020-03-14: 1400 [IU]/h via INTRAVENOUS
  Administered 2020-03-16: 1500 [IU]/h via INTRAVENOUS
  Filled 2020-03-14 (×4): qty 250

## 2020-03-14 MED ORDER — ACETAMINOPHEN 325 MG PO TABS: 650.0000 mg | ORAL_TABLET | Freq: Four times a day (QID) | ORAL | Status: DC | PRN

## 2020-03-14 MED ORDER — ALBUTEROL SULFATE HFA 108 (90 BASE) MCG/ACT IN AERS
2.0000 | INHALATION_SPRAY | RESPIRATORY_TRACT | Status: DC | PRN
  Filled 2020-03-14: qty 6.7

## 2020-03-14 MED ORDER — MORPHINE SULFATE (PF) 4 MG/ML IV SOLN
6.0000 mg | Freq: Once | INTRAVENOUS | Status: AC
  Administered 2020-03-14: 6 mg via INTRAVENOUS
  Filled 2020-03-14: qty 2

## 2020-03-14 MED ORDER — ATORVASTATIN CALCIUM 20 MG PO TABS
20.0000 mg | ORAL_TABLET | Freq: Every day | ORAL | Status: DC
  Administered 2020-03-14 – 2020-03-17 (×4): 20 mg via ORAL
  Filled 2020-03-14: qty 2
  Filled 2020-03-14 (×2): qty 1
  Filled 2020-03-14: qty 2

## 2020-03-14 MED ORDER — SODIUM CHLORIDE 0.9 % IV SOLN: INTRAVENOUS | Status: AC

## 2020-03-14 MED ORDER — IOHEXOL 350 MG/ML SOLN
100.0000 mL | Freq: Once | INTRAVENOUS | Status: AC | PRN
  Administered 2020-03-14: 80 mL via INTRAVENOUS

## 2020-03-14 MED ORDER — RIVAROXABAN 20 MG PO TABS: 20.0000 mg | ORAL_TABLET | Freq: Once | ORAL | Status: DC

## 2020-03-14 MED ORDER — HYDRALAZINE HCL 20 MG/ML IJ SOLN
10.0000 mg | Freq: Three times a day (TID) | INTRAMUSCULAR | Status: DC | PRN
Start: 2020-03-14 — End: 2020-03-17

## 2020-03-14 NOTE — Progress Notes (Signed)
ANTICOAGULATION CONSULT NOTE - Follow Up Consult  Pharmacy Consult for heparin  Indication: pulmonary embolus  Allergies  Allergen Reactions  . Norco [Hydrocodone-Acetaminophen] Other (See Comments)    Made pt feel jittery     Patient Measurements: Height: 5' 6.5" (168.9 cm) Weight: 81.6 kg (180 lb) IBW/kg (Calculated) : 64.95 Heparin Dosing Weight: 81.3kg   Vital Signs: Temp: 97.6 F (36.4 C) (02/10 1004) Temp Source: Oral (02/10 1004) BP: 97/65 (02/10 2020) Pulse Rate: 100 (02/10 2020)  Labs: Recent Labs    03/14/20 1038 03/14/20 1232 03/14/20 2000  HGB 14.7  --   --   HCT 42.9  --   --   PLT 176  --   --   APTT  --   --  118*  HEPARINUNFRC  --   --  0.40  CREATININE 0.86  --   --   TROPONINIHS 4 5  --     Estimated Creatinine Clearance: 87.9 mL/min (by C-G formula based on SCr of 0.86 mg/dL).   Medical History: Past Medical History:  Diagnosis Date  . Hyperlipidemia   . lung ca dx'd 12/2018  . Pneumonia   . Pulmonary embolus ([place])   . Tobacco abuse     Medications:  (Not in a hospital admission)  Scheduled:  . atorvastatin  20 mg Oral Daily  . famotidine  20 mg Oral BID  . loratadine  10 mg Oral Daily  . [START ON 03/15/2020] umeclidinium bromide  2 puff Inhalation Daily   Infusions:  . sodium chloride 75 mL/hr at 03/14/20 1615  . heparin 1,400 Units/hr (03/14/20 1415)   PRN: acetaminophen **OR** acetaminophen, albuterol, hydrALAZINE, HYDROcodone-acetaminophen, morphine injection, polyethylene glycol Anti-infectives (From admission, onward)   None      Assessment: 65 yo M with hx of NSCL cancer on immunotherapy and PE currently on Xarelto who presents to the ED with worsening chest pain. Pt reports missing past 2-3 days of Xarelto due to his prescription running out. 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 Pharmacy has been consulted for heparin dosing.    Heparin level therapeutic (0.4) on 1400  units/hr  PTT 118 seconds  No bleeding documented  Goal of Therapy:   Heparin level 0.3-0.7 units/ml aPTT 66-102 seconds Monitor platelets by anticoagulation protocol: Yes   Plan:  Continue heparin infusion at 1400 units/hr Check anti-Xa level in 6 hours to confirm therapeutic and daily while on heparin No need to monitor PTT since heparin levels are likely accurate at this point given last xarelto > 72hr ago Continue to monitor H&H and platelets  Peggyann Juba, PharmD, BCPS Pharmacy: [phone] 03/14/2020,9:12 PM

## 2020-03-14 NOTE — H&P (Signed)
History and Physical        Hospital Admission Note Date: 03/14/2020  Patient name: Philip [name] Medical record number: [id number] Date of birth: 09-17-1955 Age: 65 y.o. Gender: male  PCP: [name], MD    Chief Complaint    Chief Complaint  Patient presents with  . Chest Pain  . Lung Cancer  . Cough      HPI:   This is a 65 year old male who has been vaccinated against COVID-19 with a past medical history of non-small cell adenocarcinoma of the lung diagnosed in 2020 currently on immunotherapy with Imfinzi and follows with Dr. Earlie Server, PE on Xarelto, tobacco use, COPD on nightly O2 who presented to the ED with right-sided upper chest pain described as sharp and constant for one day.  Patient reports that he ran out of his Xarelto and missed the last 2 to 3 days of his anticoagulant.  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  Associated with cough and shortness of breath.  Worse on deep inspiration and laying flat and alleviated with changes in position.  Lifts heavy boxes at work but is able to delegate responsibilities if needed and denies any known injury recently.   ED Course: Afebrile, hemodynamically stable, on room air. Notable Labs: Sodium 133, K4.1, BUN 15, creatinine 0.86, WBC 10.0, Hb 14.7, troponin 4, COVID-19 pending. Notable Imaging: CXR-areas of scarring with left perihilar retraction and no appreciable new opacity, stable from prior.  CTA chest-acute on chronic RLL PE now nearly occluding the right lower lobe pulmonary arterial tree with evidence of right heart strain on CT consistent with at least submassive PE.  Upon signout from ED provider, she attempted to ambulate the patient however he desatted to the 32s.  Patient received morphine.    Vitals:   03/14/20 1230 03/14/20  1300  BP: (!) 89/59 102/79  Pulse: 97 96  Resp: 20 (!) 22  Temp:    SpO2: 96% 98%     Review of Systems:  Review of Systems  All other systems reviewed and are negative.   Medical/Social/Family History   Past Medical History: Past Medical History:  Diagnosis Date  . Hyperlipidemia   . lung ca dx'd 12/2018  . Pneumonia   . Pulmonary embolus ([place])   . Tobacco abuse     Past Surgical History:  Procedure Laterality Date  . COLONOSCOPY    . HAND SURGERY    . VIDEO BRONCHOSCOPY WITH ENDOBRONCHIAL ULTRASOUND N/A 12/22/2018   Procedure: VIDEO BRONCHOSCOPY WITH ENDOBRONCHIAL ULTRASOUND;  Surgeon: Garner Nash, DO;  Location: [facility];  Service: Thoracic;  Laterality: N/A;  . VIDEO BRONCHOSCOPY WITH ENDOBRONCHIAL ULTRASOUND N/A 01/09/2019   Procedure: VIDEO BRONCHOSCOPY WITH ENDOBRONCHIAL ULTRASOUND WITH FLUORO;  Surgeon: Garner Nash, DO;  Location: [facility];  Service: Thoracic;  Laterality: N/A;  . VIDEO BRONCHOSCOPY WITH RADIAL ENDOBRONCHIAL ULTRASOUND N/A 12/22/2018   Procedure: RADIAL ENDOBRONCHIAL ULTRASOUND;  Surgeon: Garner Nash, DO;  Location: [facility];  Service: Thoracic;  Laterality: N/A;    Medications: Prior to Admission medications   Medication Sig Start Date End Date Taking? Authorizing  Provider  albuterol (VENTOLIN HFA) 108 (90 Base) MCG/ACT inhaler Inhale 2 puffs into the lungs every 4 (four) hours as needed for wheezing or shortness of breath. 06/27/19  Yes [name], DO  atorvastatin (LIPITOR) 20 MG tablet Take 20 mg by mouth daily. 09/22/18  Yes [provider]  famotidine (PEPCID) 20 MG tablet TAKE 1 TABLET BY MOUTH TWICE A DAY Patient taking differently: Take 20 mg by mouth 2 (two) times daily. 02/01/20  Yes [name], MD  fexofenadine (ALLEGRA) 180 MG tablet Take 180 mg by mouth daily.   Yes [provider]  oxyCODONE-acetaminophen (PERCOCET/ROXICET) 5-325 MG tablet Take 0.5 tablets by mouth every 6 (six) hours as needed for  severe pain.   Yes [provider]  rivaroxaban (XARELTO) 20 MG TABS tablet Take 1 tablet (20 mg total) by mouth daily with supper. 01/15/20  Yes [name], DO  SPIRIVA RESPIMAT 1.25 MCG/ACT AERS Inhale 2 puffs into the lungs daily. 02/04/20  Yes [provider]  Nebulizers (COMPRESSOR/NEBULIZER) MISC 1 application by Does not apply route every 4 (four) hours as needed. 06/19/19   Gu[place]ford Shi, MD  Tiotropium Bromide-Olodaterol (STIOLTO RESPIMAT) 2.5-2.5 MCG/ACT AERS Inhale 2 puffs into the lungs daily. Patient not taking: Reported on 03/14/2020 01/15/20   [name], DO    Allergies:   Allergies  Allergen Reactions  . Norco [Hydrocodone-Acetaminophen] Other (See Comments)    Made pt feel jittery     Social History:  reports that he has been smoking cigarettes. He started smoking about 49 years ago. He has smoked for the past 45.00 years. He has quit using smokeless tobacco.  His smokeless tobacco use included chew. He reports current alcohol use. He reports previous drug use. Drug: Marijuana.  Family History: Family History  Problem Relation Age of Onset  . Cancer Father   . Stomach cancer Father      Objective   Physical Exam: Blood pressure 102/79, pulse 96, temperature 97.6 F (36.4 C), temperature source Oral, resp. rate (!) 22, height 5' 6.5" (1.689 m), weight 81.6 kg, SpO2 98 %.  Physical Exam Vitals and nursing note reviewed.  Constitutional:      Appearance: Normal appearance.  HENT:     Head: Normocephalic and atraumatic.  Eyes:     Conjunctiva/sclera: Conjunctivae normal.  Cardiovascular:     Rate and Rhythm: Normal rate and regular rhythm.  Pulmonary:     Effort: Pulmonary effort is normal.     Breath sounds: Normal breath sounds.  Abdominal:     General: Abdomen is flat.     Palpations: Abdomen is soft.  Musculoskeletal:        General: No swelling or tenderness.  Skin:    Coloration: Skin is not jaundiced or pale.   Neurological:     Mental Status: He is alert. Mental status is at baseline.  Psychiatric:        Mood and Affect: Mood normal.        Behavior: Behavior normal.     LABS on Admission: I have personally reviewed all the labs and imaging below    Basic Metabolic Panel: Recent Labs  Lab 03/14/20 1038  NA 133*  K 4.1  CL 101  CO2 22  GLUCOSE 108*  BUN 15  CREATININE 0.86  CALCIUM 8.7*   Liver Function Tests: No results for input(s): AST, ALT, ALKPHOS, BILITOT, PROT, ALBUMIN in the last 168 hours. No results for input(s): LIPASE, AMYLASE in the last  168 hours. No results for input(s): AMMONIA in the last 168 hours. CBC: Recent Labs  Lab 03/14/20 1038  WBC 10.0  HGB 14.7  HCT 42.9  MCV 92.9  PLT 176   Cardiac Enzymes: No results for input(s): CKTOTAL, CKMB, CKMBINDEX, TROPONINI in the last 168 hours. BNP: Invalid input(s): POCBNP CBG: No results for input(s): GLUCAP in the last 168 hours.  Radiological Exams on Admission:  DG Chest 2 View  Result Date: 03/14/2020 CLINICAL DATA:  Chest pain and shortness of breath EXAM: CHEST - 2 VIEW COMPARISON:  Jun 16, 2019 chest radiograph and January 24, 2020 chest CT FINDINGS: There is stable scarring in the right upper and right lower lobe regions as well as in the left perihilar region with retraction in the left perihilar region, stable. No edema or airspace opacity is evident. Heart size is normal. Distortion of pulmonary vascularity is stable. No overt adenopathy is evident by radiography. There is aortic atherosclerosis. There is degenerative change in the thoracic spine. IMPRESSION: Areas of scarring as well as left perihilar retraction, stable. No appreciable new opacity. Stable cardiac silhouette. Aortic Atherosclerosis (ICD10-I70.0). Electronically Signed   By: Lowella Grip III M.D.   On: 03/14/2020 10:06   CT Angio Chest PE W and/or Wo Contrast  Result Date: 03/14/2020 CLINICAL DATA:  Pleuritic chest pain. History  of pulmonary embolism with missed anticoagulation medication for 3 days. History of non-small cell right lung cancer status post chemotherapy and radiation therapy. EXAM: CT ANGIOGRAPHY CHEST WITH CONTRAST TECHNIQUE: Multidetector CT imaging of the chest was performed using the standard protocol during bolus administration of intravenous contrast. Multiplanar CT image reconstructions and MIPs were obtained to evaluate the vascular anatomy. CONTRAST:  79mL OMNIPAQUE IOHEXOL 350 MG/ML SOLN COMPARISON:  01/24/2020 chest CT. FINDINGS: Cardiovascular: The study is moderate quality for the evaluation of pulmonary embolism, with some motion degradation. Acute on chronic expansile pulmonary embolism in right lower lobe now nearly occluding the right lower lobe pulmonary arterial tree to the lobar level (series 5/image 134). No additional sites of pulmonary embolism. Mild atherosclerotic nonaneurysmal thoracic aorta. Top-normal caliber main pulmonary artery (3.3 cm diameter), stable. Normal heart size. No significant pericardial fluid/thickening. RV/LV ratio 1.0, slightly elevated. Mediastinum/Nodes: No discrete thyroid nodules. Unremarkable esophagus. No axillary adenopathy. No pathologically enlarged mediastinal or hilar nodes. Lungs/Pleura: No pneumothorax. Trace dependent right pleural effusion, not appreciably changed. No left pleural effusion. Moderate centrilobular and paraseptal emphysema with mild diffuse bronchial wall thickening and saber sheath trachea. Right perihilar masslike fibrosis measures 5.8 x 3.3 cm (series 6/image 53), previously 5.8 x 3.7 cm, not appreciably changed. Dense consolidation, volume loss, bronchiectasis and distortion in the medial right lower lobe is not substantially changed, compatible with a combination of radiation fibrosis and pre-existing postinfectious scarring. No acute interval consolidative airspace disease or new significant pulmonary nodules. Upper abdomen: Stable 2.1 cm left  adrenal adenoma with density -5 HU. Stable 1.4 cm right adrenal adenoma with density 8 HU. Musculoskeletal: No aggressive appearing focal osseous lesions. Moderate thoracic spondylosis. Review of the MIP images confirms the above findings. IMPRESSION: 1. Acute on chronic expansile right lower lobe pulmonary embolism now nearly occluding the right lower lobe pulmonary arterial tree to the lobar level. No additional sites of pulmonary embolism. 2. Positive for acute PE with CTevidence of right heart strain (RV/LV Ratio = 1.0) consistent with at least submassive (intermediate risk) PE. The presence of right heart strain has been associated with an increased risk of morbidity and mortality.  3. Stable post-treatment changes in the right hemithorax. No findings of metastatic disease in the chest. 4. Stable trace dependent right pleural effusion. 5. Stable bilateral adrenal adenomas. 6. Aortic Atherosclerosis (ICD10-I70.0) and Emphysema (ICD10-J43.9). Critical Value/emergent results were called by telephone at the time of interpretation on 03/14/2020 at 12:06 pm to provider CLAUDIA GIBBONS , who verbally acknowledged these results. Electronically Signed   By: Ilona Sorrel M.D.   On: 03/14/2020 12:08      EKG: sinus tachycardia   A & P   Principal Problem:   Acute pulmonary embolism (HCC) Active Problems:   Adenocarcinoma of right lung, stage 3 (HCC)   History of pulmonary embolus (PE)   1. 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 Likely secondary to missing several doses of Xarelto in the setting of a road trip and malignancy and unlikely failure of home anticoagulation b. Hemodynamically stable.  Tolerating room air at rest but reportedly desatted on ambulation per ED provider c. Heparin drip for now d. Ambulatory pulse ox in a.m. e. hopefully can DC tomorrow and restart home Xarelto prior to discharge   2. COPD, not in acute exacerbation a. Emphysema on  CT b. Continue home inhalers  3. Non-small cell adenocarcinoma of the lung a. On immunotherapy b. Outpatient follow-up with Dr. Earlie Server  4. Aortic atherosclerosis a. Continue home Lipitor     DVT prophylaxis: Heparin   Code Status: Full Code  Diet: Heart healthy Family Communication: Admission, patients condition and plan of care including tests being ordered have been discussed with the patient who indicates understanding and agrees with the plan and Code Status.  Disposition Plan: The appropriate patient status for this patient is OBSERVATION. Observation status is judged to be reasonable and necessary in order to provide the required intensity of service to ensure the patient's safety. The patient's presenting symptoms, physical exam findings, and initial radiographic and laboratory data in the context of their medical condition is felt to place them at decreased risk for further clinical deterioration. Furthermore, it is anticipated that the patient will be medically stable for discharge from the hospital within 2 midnights of admission. The following factors support the patient status of observation.   " The patient's presenting symptoms include chest pain, shortness of breath. " The physical exam findings include unremarkable. " The initial radiographic and laboratory data are acute on chronic PE.   Consultants  . None  Procedures  . None  Time Spent on Admission: 60 minutes    Harold Hedge, DO [facility]  03/14/2020, 1:31 PM

## 2020-03-14 NOTE — ED Provider Notes (Addendum)
[facility] DEPT Provider Note   CSN: [id number] Arrival date & time: 03/14/20  0930     History Chief Complaint  Patient presents with  . Chest Pain  . Lung Cancer  . Cough    Philip [name] is a 65 y.o. male with past medical history of non-small cell lung cancer, adenocarcinoma diagnosed 2020 chemotherapy and radiation currently under immunotherapy every 4 weeks, drug use, PE on Xarelto presents to the ED for evaluation of right-sided upper chest pain that is sharp, constant but significantly worse with taking deep breaths and coughing.  At first pain was mild and he thought he pulled a muscle.  Pain now worsening. Patient is holding his chest when he is coughing due to the pain.  This began yesterday.  Pain is also worse if he lays flat.  He can sometimes reposition his self in bed and the pain is alleviated with change in position.  Reports chronic cough due to lung cancer and smoking but feels like this is unchanged.  Typically it is productive of clear and foamy phlegm.  No hemoptysis.  Reports shortness of breath at baseline. He has oxygen at home he wears at night 2 L. due to the pain when taking deep breaths.  No radiation of pain into the neck, arm, back.  Some chills but no fevers.  No associated nausea, vomiting, abdominal pain, extremity numbness or weakness.  He lifts a lot of heavy boxes at work but denies any known injury, trauma, falls.  Concerned about a PE since he has missed the last 2 to 3 days of Xarelto.  No leg swelling or calf pain.  Vaccinated for Covid with a booster and denies any other Covid symptoms.  Bilirubin.  HPI     Past Medical History:  Diagnosis Date  . Hyperlipidemia   . lung ca dx'd 12/2018  . Pneumonia   . Pulmonary embolus ([place])   . Tobacco abuse     Patient Active Problem List   Diagnosis Date Noted  . Healthcare maintenance 10/10/2019  . Allergic rhinitis 10/10/2019  . COPD with acute bronchitis ([place])  06/19/2019  . Pneumonia 06/19/2019  . 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 Respiratory failure, acute ([place]) 06/19/2019  . History of pulmonary embolus (PE) 06/16/2019  . Encounter for antineoplastic immunotherapy 04/26/2019  . Odynophagia 03/13/2019  . Adenocarcinoma of right lung, stage 3 ([place]) 01/13/2019  . Goals of care, counseling/discussion 01/13/2019  . Encounter for antineoplastic chemotherapy 01/13/2019  . Tobacco abuse counseling 01/13/2019  . Pneumothorax 01/09/2019  . S/P bronchoscopy with biopsy   . Lung mass 12/22/2018  . S/P bronchoscopy     Past Surgical History:  Procedure Laterality Date  . COLONOSCOPY    . HAND SURGERY    . VIDEO BRONCHOSCOPY WITH ENDOBRONCHIAL ULTRASOUND N/A 12/22/2018   Procedure: VIDEO BRONCHOSCOPY WITH ENDOBRONCHIAL ULTRASOUND;  Surgeon: Garner Nash, DO;  Location: [facility];  Service: Thoracic;  Laterality: N/A;  . VIDEO BRONCHOSCOPY WITH ENDOBRONCHIAL ULTRASOUND N/A 01/09/2019   Procedure: VIDEO BRONCHOSCOPY WITH ENDOBRONCHIAL ULTRASOUND WITH FLUORO;  Surgeon: Garner Nash, DO;  Location: [facility];  Service: Thoracic;  Laterality: N/A;  . VIDEO BRONCHOSCOPY WITH RADIAL ENDOBRONCHIAL ULTRASOUND N/A 12/22/2018   Procedure: RADIAL ENDOBRONCHIAL ULTRASOUND;  Surgeon: Garner Nash, DO;  Location: [facility] OR;  Service: Thoracic;  Laterality: N/A;       Family History  Problem Relation Age of Onset  . Cancer Father   . Stomach  cancer Father     Social History   Tobacco Use  . Smoking status: Light Tobacco Smoker    Years: 45.00    Types: Cigarettes    Start date: 07/04/1970  . Smokeless tobacco: Former Systems developer    Types: Secondary school teacher  . Vaping Use: Never used  Substance Use Topics  . Alcohol use: Yes    Comment: drinks 1/2 5th every weekend  . Drug use: Not Currently    Types: Marijuana    Home Medications Prior to Admission medications   Medication Sig Start Date End Date Taking? Authorizing  Provider  albuterol (VENTOLIN HFA) 108 (90 Base) MCG/ACT inhaler Inhale 2 puffs into the lungs every 4 (four) hours as needed for wheezing or shortness of breath. 06/27/19  Yes [name], DO  atorvastatin (LIPITOR) 20 MG tablet Take 20 mg by mouth daily. 09/22/18  Yes [provider]  famotidine (PEPCID) 20 MG tablet TAKE 1 TABLET BY MOUTH TWICE A DAY Patient taking differently: Take 20 mg by mouth 2 (two) times daily. 02/01/20  Yes [name], MD  fexofenadine (ALLEGRA) 180 MG tablet Take 180 mg by mouth daily.   Yes [provider]  oxyCODONE-acetaminophen (PERCOCET/ROXICET) 5-325 MG tablet Take 0.5 tablets by mouth every 6 (six) hours as needed for severe pain.   Yes [provider]  rivaroxaban (XARELTO) 20 MG TABS tablet Take 1 tablet (20 mg total) by mouth daily with supper. 01/15/20  Yes [name], DO  SPIRIVA RESPIMAT 1.25 MCG/ACT AERS Inhale 2 puffs into the lungs daily. 02/04/20  Yes [provider]  Nebulizers (COMPRESSOR/NEBULIZER) MISC 1 application by Does not apply route every 4 (four) hours as needed. 06/19/19   Guilford Shi, MD  Tiotropium Bromide-Olodaterol (STIOLTO RESPIMAT) 2.5-2.5 MCG/ACT AERS Inhale 2 puffs into the lungs daily. Patient not taking: Reported on 03/14/2020 01/15/20   [name], DO    Allergies    Norco [hydrocodone-acetaminophen]  Review of Systems   Review of Systems  Respiratory: Positive for cough.   Cardiovascular: Positive for chest pain.  Allergic/Immunologic: Positive for immunocompromised state.  Hematological: Bruises/bleeds easily.  All other systems reviewed and are negative.   Physical Exam Updated Vital Signs BP 102/79   Pulse 96   Temp 97.6 F (36.4 C) (Oral)   Resp (!) 22   Ht 5' 6.5" (1.689 m)   Wt 81.6 kg   SpO2 98%   BMI 28.62 kg/m   Physical Exam Constitutional:      Appearance: He is well-developed.     Comments: Nontoxic-appearing but appears uncomfortable.   Grabbing his right upper chest with coughing   HENT:     Head: Normocephalic and atraumatic.     Nose: Nose normal.  Eyes:     General: Lids are normal.     Conjunctiva/sclera: Conjunctivae normal.  Neck:     Trachea: Trachea normal.     Comments: Trachea midline.  Cardiovascular:     Rate and Rhythm: Normal rate and regular rhythm.     Pulses:          Radial pulses are 1+ on the right side and 1+ on the left side.       Dorsalis pedis pulses are 1+ on the right side and 1+ on the left side.     Heart sounds: Normal heart sounds, S1 normal and S2 normal.     Comments: No murmurs. No LE edema or calf tenderness.  Pulmonary:  Effort: Pulmonary effort is normal.     Breath sounds: Normal breath sounds.  Chest:     Comments: No reproducible chest wall tenderness with palpation or active range of motion of the right arm Abdominal:     General: Bowel sounds are normal.     Palpations: Abdomen is soft.     Tenderness: There is no abdominal tenderness.     Comments: No epigastric or upper abdominal tenderness.  Musculoskeletal:     Cervical back: Normal range of motion.  Skin:    General: Skin is warm and dry.     Capillary Refill: Capillary refill takes less than 2 seconds.     Comments: No rash to chest wall  Neurological:     Mental Status: He is alert.     GCS: GCS eye subscore is 4. GCS verbal subscore is 5. GCS motor subscore is 6.     Comments: Sensation and strength intact in upper/lower extremities  Psychiatric:        Speech: Speech normal.        Behavior: Behavior normal.        Thought Content: Thought content normal.     ED Results / Procedures / Treatments   Labs (all labs ordered are listed, but only abnormal results are displayed) Labs Reviewed  BASIC METABOLIC PANEL - Abnormal; Notable for the following components:      Result Value   Sodium 133 (*)    Glucose, Bld 108 (*)    Calcium 8.7 (*)    All other components within normal limits  RESP PANEL  BY RT-PCR (FLU A&B, COVID) [id number]  CBC  TROPONIN I (HIGH SENSITIVITY)  TROPONIN I (HIGH SENSITIVITY)    EKG EKG Interpretation  Date/Time:  Thursday March 14 2020 10:03:54 EST Ventricular Rate:  102 PR Interval:    QRS Duration: 97 QT Interval:  334 QTC Calculation: 435 R Axis:   -78 Text Interpretation: Sinus tachycardia Left anterior fascicular block Consider right ventricular hypertrophy No significant change since last tracing Confirmed by Blanchie Dessert [phone]) on 03/14/2020 10:12:44 AM   Radiology DG Chest 2 View  Result Date: 03/14/2020 CLINICAL DATA:  Chest pain and shortness of breath EXAM: CHEST - 2 VIEW COMPARISON:  Jun 16, 2019 chest radiograph and January 24, 2020 chest CT FINDINGS: There is stable scarring in the right upper and right lower lobe regions as well as in the left perihilar region with retraction in the left perihilar region, stable. No edema or airspace opacity is evident. Heart size is normal. Distortion of pulmonary vascularity is stable. No overt adenopathy is evident by radiography. There is aortic atherosclerosis. There is degenerative change in the thoracic spine. IMPRESSION: Areas of scarring as well as left perihilar retraction, stable. No appreciable new opacity. Stable cardiac silhouette. Aortic Atherosclerosis (ICD10-I70.0). Electronically Signed   By: Lowella Grip III M.D.   On: 03/14/2020 10:06   CT Angio Chest PE W and/or Wo Contrast  Result Date: 03/14/2020 CLINICAL DATA:  Pleuritic chest pain. History of pulmonary embolism with missed anticoagulation medication for 3 days. History of non-small cell right lung cancer status post chemotherapy and radiation therapy. EXAM: CT ANGIOGRAPHY CHEST WITH CONTRAST TECHNIQUE: Multidetector CT imaging of the chest was performed using the standard protocol during bolus administration of intravenous contrast. Multiplanar CT image reconstructions and MIPs were obtained to evaluate the vascular anatomy.  CONTRAST:  34mL OMNIPAQUE IOHEXOL 350 MG/ML SOLN COMPARISON:  01/24/2020 chest CT. FINDINGS: Cardiovascular: The study is moderate  quality for the evaluation of pulmonary embolism, with some motion degradation. Acute on chronic expansile pulmonary embolism in right lower lobe now nearly occluding the right lower lobe pulmonary arterial tree to the lobar level (series 5/image 134). No additional sites of pulmonary embolism. Mild atherosclerotic nonaneurysmal thoracic aorta. Top-normal caliber main pulmonary artery (3.3 cm diameter), stable. Normal heart size. No significant pericardial fluid/thickening. RV/LV ratio 1.0, slightly elevated. Mediastinum/Nodes: No discrete thyroid nodules. Unremarkable esophagus. No axillary adenopathy. No pathologically enlarged mediastinal or hilar nodes. Lungs/Pleura: No pneumothorax. Trace dependent right pleural effusion, not appreciably changed. No left pleural effusion. Moderate centrilobular and paraseptal emphysema with mild diffuse bronchial wall thickening and saber sheath trachea. Right perihilar masslike fibrosis measures 5.8 x 3.3 cm (series 6/image 53), previously 5.8 x 3.7 cm, not appreciably changed. Dense consolidation, volume loss, bronchiectasis and distortion in the medial right lower lobe is not substantially changed, compatible with a combination of radiation fibrosis and pre-existing postinfectious scarring. No acute interval consolidative airspace disease or new significant pulmonary nodules. Upper abdomen: Stable 2.1 cm left adrenal adenoma with density -5 HU. Stable 1.4 cm right adrenal adenoma with density 8 HU. Musculoskeletal: No aggressive appearing focal osseous lesions. Moderate thoracic spondylosis. Review of the MIP images confirms the above findings. IMPRESSION: 1. Acute on chronic expansile right lower lobe pulmonary embolism now nearly occluding the right lower lobe pulmonary arterial tree to the lobar level. No additional sites of pulmonary  embolism. 2. Positive for acute PE with CTevidence of right heart strain (RV/LV Ratio = 1.0) consistent with at least submassive (intermediate risk) PE. The presence of right heart strain has been associated with an increased risk of morbidity and mortality. 3. Stable post-treatment changes in the right hemithorax. No findings of metastatic disease in the chest. 4. Stable trace dependent right pleural effusion. 5. Stable bilateral adrenal adenomas. 6. Aortic Atherosclerosis (ICD10-I70.0) and Emphysema (ICD10-J43.9). Critical Value/emergent results were called by telephone at the time of interpretation on 03/14/2020 at 12:06 pm to provider Glenda Kunst , who verbally acknowledged these results. Electronically Signed   By: Ilona Sorrel M.D.   On: 03/14/2020 12:08    Procedures Procedures   Medications Ordered in ED Medications  benzonatate (TESSALON) capsule 200 mg (has no administration in time range)  HYDROmorphone (DILAUDID) injection 1 mg (has no administration in time range)  morphine 4 MG/ML injection 6 mg (6 mg Intravenous Given 03/14/20 1034)  iohexol (OMNIPAQUE) 350 MG/ML injection 100 mL (80 mLs Intravenous Contrast Given 03/14/20 1139)    ED Course  I have reviewed the triage vital signs and the nursing notes.  Pertinent labs & imaging results that were available during my care of the patient were reviewed by me and considered in my medical decision making (see chart for details).  Clinical Course as of 03/14/20 1312  Thu Mar 14, 2020  1015 DG Chest 2 View IMPRESSION: Areas of scarring as well as left perihilar retraction, stable. No appreciable new opacity. Stable cardiac silhouette. Aortic Atherosclerosis (ICD10-I70.0). [CG]  1027 ED EKG within 10 minutes Sinus tachycardia Left anterior fascicular block Consider right ventricular hypertrophy No significant change since last tracing Confirmed by Blanchie Dessert [phone]) on 03/14/2020 10:12:44 AM [CG]  1207 +acute on chronic pe  RLL. No central PE. Meets strain but unchanged  [CG]    Clinical Course User Index [CG] Arlean Hopping   MDM Rules/Calculators/A&P  65 y.o. yo with chief complaint of right-sided positional, pleuritic upper chest pain, sharp.  History of right lung adenocarcinoma s/p chemotherapy, radiation currently undergoing immunotherapy, PE on Xarelto.  Missed the last 2 to 3 days of Xarelto.  Chronic cough, unchanged.  No fever.  Lifts heavy boxes at work.  No associated abdominal pain, nausea or vomiting.  Vaccinated with booster frequently.   Previous medical records available, triage and nursing notes reviewed to obtain more history and assist with MDM  Additional information obtained from chart review.  Had last immunotherapy infusion 1/20  Chief complain involves an extensive number of treatment options and is a complaint that carries with it a high risk of complications and morbidity and mortality.    Differential diagnosis: MSK/chest wall pain versus pneumonia versus pneumothorax versus PE versus pericarditis.  ACS considered but would be very unlikely given his symptoms.  No associated right upper quadrant tenderness, nausea or vomiting or postprandial pain, gallbladder etiology considered highly unlikely.  No neuro.  This is cystlike, doubt dissection.  ER lab work and imaging ordered by triage RN and me, as above  I have personally visualized and interpreted ER diagnostic work up including labs and imaging.    Labs reveal -normal WBC, hemoglobin, electrolytes and creatinine.  Troponin 4, pending delta.  Pending respiratory panel.  Imaging reveals -EKG shows sinus tachycardia but no significant changes since last tracing.  Chest x-ray unchanged.  CTA she has acute on chronic right lower lobe PE.  No central or saddle PE noted.  Strain criteria but unchanged from the last CT scan.  Medications ordered -morphine, heparin infusion per pharmacy  Ordered  continuous cardiac and pulse ox monitoring.  Will plan for serial re-examinations. Close monitoring.   1310: Re-evaluated the patient.   SPO2 89% at rest.  Pain is improved but still severe if he coughs.  I personally ambulated patient for 30 seconds and SPO2 dropped to 82%, noted increased work of breathing.  He does have oxygen at home but he feels like his breathing is more labored than usual.  Last SBP 89, recheck 102. Soft Bps, other than pain with coughing patient well appearing.   We will give repeat pain medicine, placed on 2 L [place].  Given acute on chronic PE, hypoxia hospitalist was consulted.  Dr. Sonny Masters will see patient.  Discussed with EDP. Final Clinical Impression(s) / ED Diagnoses Final diagnoses:  Single subsegmental pulmonary embolism without acute cor pulmonale ([place])  Hypoxia    Rx / DC Orders ED Discharge Orders    None        Kinnie Feil, [name] 03/14/20 1313    Blanchie Dessert, MD 03/18/20 1750

## 2020-03-14 NOTE — Telephone Encounter (Signed)
Chest Pain-Pt left VM that he was having acute chest pain. When I pulled up his chart I see he is in ED.

## 2020-03-14 NOTE — Progress Notes (Signed)
ANTICOAGULATION CONSULT NOTE - Initial Consult  Pharmacy Consult for heparin  Indication: pulmonary embolus  Allergies  Allergen Reactions  . Norco [Hydrocodone-Acetaminophen] Other (See Comments)    Made pt feel jittery     Patient Measurements: Height: 5' 6.5" (168.9 cm) Weight: 81.6 kg (180 lb) IBW/kg (Calculated) : 64.95 Heparin Dosing Weight: 81.3kg   Vital Signs: Temp: 97.6 F (36.4 C) (02/10 1004) Temp Source: Oral (02/10 1004) BP: 102/79 (02/10 1300) Pulse Rate: 96 (02/10 1300)  Labs: Recent Labs    03/14/20 1038  HGB 14.7  HCT 42.9  PLT 176  CREATININE 0.86  TROPONINIHS 4    Estimated Creatinine Clearance: 87.9 mL/min (by C-G formula based on SCr of 0.86 mg/dL).   Medical History: Past Medical History:  Diagnosis Date  . Hyperlipidemia   . lung ca dx'd 12/2018  . Pneumonia   . Pulmonary embolus ([place])   . Tobacco abuse     Medications:  (Not in a hospital admission)  Scheduled:  . atorvastatin  20 mg Oral Daily  . benzonatate  200 mg Oral Once  . famotidine  20 mg Oral BID  . heparin  5,000 Units Intravenous Once  . loratadine  10 mg Oral Daily  . sodium chloride flush  3 mL Intravenous Q12H  . Tiotropium Bromide Monohydrate  2 puff Inhalation Daily   Infusions:  . sodium chloride    . heparin     PRN: acetaminophen **OR** acetaminophen, albuterol, hydrALAZINE, HYDROcodone-acetaminophen, morphine injection, polyethylene glycol Anti-infectives (From admission, onward)   None      Assessment: 65 yo M with hx of NSCL cancer on immunotherapy and PE currently on Xarelto who presents to the ED with worsening chest pain. Pt reports missing past 2-3 days of Xarelto due to his prescription running out. 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 Pharmacy has been consulted for heparin dosing.   Goal of Therapy:   Heparin level 0.3-0.7 units/ml aPTT 66-102 seconds Monitor platelets by anticoagulation protocol: Yes    Plan:  Give 5000 unit bolus x 1 Start heparin infusion at 1400 units/hr Check anti-Xa level in 6 hours and daily while on heparin Continue to monitor H&H and platelets  Durward Fortes, PharmD Student 03/14/2020,1:20 PM

## 2020-03-14 NOTE — ED Triage Notes (Addendum)
Patient reports that he has been having left chest pain since yesterday. Patient states he has had SOB and a cough for a while, but states that it hasn't changed since having the chest pain. Patient states he is currently receiving immunotherapy.   Patient has a history of a PE, but states he ran out of his Xarelto 3 days ago.

## 2020-03-15 ENCOUNTER — Telehealth: Payer: Self-pay | Admitting: *Deleted

## 2020-03-15 DIAGNOSIS — R0902 Hypoxemia: Secondary | ICD-10-CM | POA: Diagnosis present

## 2020-03-15 DIAGNOSIS — Z7901 Long term (current) use of anticoagulants: Secondary | ICD-10-CM | POA: Diagnosis not present

## 2020-03-15 DIAGNOSIS — C3491 Malignant neoplasm of unspecified part of right bronchus or lung: Secondary | ICD-10-CM | POA: Diagnosis present

## 2020-03-15 DIAGNOSIS — Z79899 Other long term (current) drug therapy: Secondary | ICD-10-CM | POA: Diagnosis not present

## 2020-03-15 DIAGNOSIS — Z20822 Contact with and (suspected) exposure to covid-19: Secondary | ICD-10-CM | POA: Diagnosis present

## 2020-03-15 DIAGNOSIS — Z9221 Personal history of antineoplastic chemotherapy: Secondary | ICD-10-CM | POA: Diagnosis not present

## 2020-03-15 DIAGNOSIS — Z87891 Personal history of nicotine dependence: Secondary | ICD-10-CM | POA: Diagnosis not present

## 2020-03-15 DIAGNOSIS — J449 Chronic obstructive pulmonary disease, unspecified: Secondary | ICD-10-CM | POA: Diagnosis present

## 2020-03-15 DIAGNOSIS — Z885 Allergy status to narcotic agent status: Secondary | ICD-10-CM | POA: Diagnosis not present

## 2020-03-15 DIAGNOSIS — I444 Left anterior fascicular block: Secondary | ICD-10-CM | POA: Diagnosis present

## 2020-03-15 DIAGNOSIS — I2693 Single subsegmental pulmonary embolism without acute cor pulmonale: Secondary | ICD-10-CM | POA: Diagnosis present

## 2020-03-15 DIAGNOSIS — J9601 Acute respiratory failure with hypoxia: Secondary | ICD-10-CM | POA: Diagnosis present

## 2020-03-15 DIAGNOSIS — E785 Hyperlipidemia, unspecified: Secondary | ICD-10-CM | POA: Diagnosis present

## 2020-03-15 DIAGNOSIS — Z9981 Dependence on supplemental oxygen: Secondary | ICD-10-CM | POA: Diagnosis not present

## 2020-03-15 DIAGNOSIS — I700 Atherosclerosis of aorta: Secondary | ICD-10-CM | POA: Diagnosis present

## 2020-03-15 DIAGNOSIS — H9319 Tinnitus, unspecified ear: Secondary | ICD-10-CM | POA: Diagnosis present

## 2020-03-15 DIAGNOSIS — I2782 Chronic pulmonary embolism: Secondary | ICD-10-CM | POA: Diagnosis present

## 2020-03-15 DIAGNOSIS — I2609 Other pulmonary embolism with acute cor pulmonale: Secondary | ICD-10-CM | POA: Diagnosis not present

## 2020-03-15 LAB — CBC
HCT: 40.1 % (ref 39.0–52.0)
Hemoglobin: 13.2 g/dL (ref 13.0–17.0)
MCH: 31.3 pg (ref 26.0–34.0)
MCHC: 32.9 g/dL (ref 30.0–36.0)
MCV: 95 fL (ref 80.0–100.0)
Platelets: 160 10*3/uL (ref 150–400)
RBC: 4.22 MIL/uL (ref 4.22–5.81)
RDW: 12.9 % (ref 11.5–15.5)
WBC: 7.7 10*3/uL (ref 4.0–10.5)
nRBC: 0 % (ref 0.0–0.2)

## 2020-03-15 LAB — BASIC METABOLIC PANEL
Anion gap: 7 (ref 5–15)
BUN: 16 mg/dL (ref 8–23)
CO2: 27 mmol/L (ref 22–32)
Calcium: 8.3 mg/dL — ABNORMAL LOW (ref 8.9–10.3)
Chloride: 101 mmol/L (ref 98–111)
Creatinine, Ser: 0.88 mg/dL (ref 0.61–1.24)
GFR, Estimated: >60 mL/min (ref >60)
Glucose, Bld: 101 mg/dL — ABNORMAL HIGH (ref 70–99)
Potassium: 4.6 mmol/L (ref 3.5–5.1)
Sodium: 135 mmol/L (ref 135–145)

## 2020-03-15 LAB — HEPARIN LEVEL (UNFRACTIONATED): Heparin Unfractionated: 0.32 IU/mL (ref 0.30–0.70)

## 2020-03-15 MED ORDER — DM-GUAIFENESIN ER 30-600 MG PO TB12
1.0000 | ORAL_TABLET | Freq: Two times a day (BID) | ORAL | Status: DC | PRN
  Administered 2020-03-16: 1 via ORAL
  Filled 2020-03-15: qty 1

## 2020-03-15 NOTE — Progress Notes (Signed)
ANTICOAGULATION CONSULT NOTE - Follow Up Consult  Pharmacy Consult for heparin  Indication: pulmonary embolus  Allergies  Allergen Reactions  . Norco [Hydrocodone-Acetaminophen] Other (See Comments)    Made pt feel jittery     Patient Measurements: Height: 5' 6.5" (168.9 cm) Weight: 81.6 kg (180 lb) IBW/kg (Calculated) : 64.95 Heparin Dosing Weight: 81.3kg   Vital Signs: BP: 103/70 (02/11 0149) Pulse Rate: 79 (02/11 0149)  Labs: Recent Labs    03/14/20 1038 03/14/20 1232 03/14/20 2000 03/15/20 0230  HGB 14.7  --   --   --   HCT 42.9  --   --   --   PLT 176  --   --   --   APTT  --   --  118*  --   HEPARINUNFRC  --   --  0.40 0.32  CREATININE 0.86  --   --   --   TROPONINIHS 4 5  --   --     Estimated Creatinine Clearance: 87.9 mL/min (by C-G formula based on SCr of 0.86 mg/dL).   Medical History: Past Medical History:  Diagnosis Date  . Hyperlipidemia   . lung ca dx'd 12/2018  . Pneumonia   . Pulmonary embolus ([place])   . Tobacco abuse     Medications:  (Not in a hospital admission)  Scheduled:  . atorvastatin  20 mg Oral Daily  . famotidine  20 mg Oral BID  . loratadine  10 mg Oral Daily  . umeclidinium bromide  2 puff Inhalation Daily   Infusions:  . heparin 1,400 Units/hr (03/14/20 1415)   PRN: acetaminophen **OR** acetaminophen, albuterol, hydrALAZINE, HYDROcodone-acetaminophen, morphine injection, polyethylene glycol Anti-infectives (From admission, onward)   None      Assessment: 65 yo M with hx of NSCL cancer on immunotherapy and PE currently on Xarelto who presents to the ED with worsening chest pain. Pt reports missing past 2-3 days of Xarelto due to his prescription running out. 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 Pharmacy has been consulted for heparin dosing.    Heparin level therapeutic (0.32) on 1400 units/hr  No bleeding documented  Goal of Therapy:   Heparin level 0.3-0.7 units/ml aPTT  66-102 seconds Monitor platelets by anticoagulation protocol: Yes   Plan:  Increase heparin drip to 1500 units to keep from falling into subtherapeutic range Daily heparin level and CBC Continue to monitor H&H and platelets  Dolly Rias RPh 03/15/2020, 3:12 AM

## 2020-03-15 NOTE — Progress Notes (Signed)
PROGRESS NOTE   Philip [name]  KGS:[id number] DOB: 1955/05/20 DOA: 03/14/2020 PCP: [name], MD  Brief Narrative:  65 year old male chronic tinnitus Prior smoker COPD stage III-lung adeno CA diagnosed 01/22/2019 no XRT secondary to severe esophagitis finished 6 cycles of chemo/immunotherapy Pulmonary MAI infection right lower lobe Right lower lobe PE diagnosed 06/16/2019 Allegedly missed 3 days anticoagulation-also car drive 3 hours [place]-developed DOE PND Came to emergency room CT chest showed acute on chronic RLL PE blocking right lower lobe pulmonary arterial tree desatted to 80s  Assessment & Plan:   Principal Problem:   Acute pulmonary embolism ([place]) Active Problems:   Adenocarcinoma of right lung, stage 3 (HCC)   History of pulmonary embolus (PE)   1. Acute hypoxic respiratory failure 2. 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 Obtain nonemergent echocardiogram continue heparin GTT b. Transition back to Xarelto 20 daily if hemodynamically remains stable by tomorrow and no other issues c. Reinforced with him strict precautions to take blood thinner-probably will need lifelong given his cancer  3. COPD stage III a. Continue albuterol every 4 as needed, Stiolto Respimat 2 puffs daily and Allegra in addition to Spiriva 2 puffs daily b. Outpatient reassessment 4. Adeno CA lung 01/2019 on immunotherapy a. Per oncologist in the outpatient setting 5. Prior MAI  DVT prophylaxis: On IV heparin GTT Code Status: Full Family Communication: None Disposition:  Status is: Observation  The patient will require care spanning > 2 midnights and should be moved to inpatient because: Hemodynamically unstable, Unsafe d/c plan, IV treatments appropriate due to intensity of illness or inability to take PO and Inpatient level of care appropriate due to severity of illness  Dispo: The patient is from: Home              Anticipated d/c is to: Home              Anticipated d/c date  is: 2 days              Patient currently is not medically stable to d/c.   Difficult to place patient No       Consultants:     Procedures:   Antimicrobials:     Subjective: Doing fair no further chest pain discomfort in chest or wheeze or discomfort on my exam  Had a full breakfast   Objective: Vitals:   03/15/20 0000 03/15/20 0030 03/15/20 0149 03/15/20 0500  BP: 107/75 105/79 103/70 100/72  Pulse: 93 88 79 77  Resp: 20 20 17 19   Temp:      TempSrc:      SpO2: 96% 95% 98% 93%  Weight:      Height:        Intake/Output Summary (Last 24 hours) at 03/15/2020 0743 Last data filed at 03/15/2020 5859 Gross per 24 hour  Intake --  Output 500 ml  Net -500 ml   Filed Weights   03/14/20 0939 03/14/20 1039  Weight: 85.7 kg 81.6 kg    Examination:  Awake coherent no distress EOMI NCAT looks about stated age CTA B no rales no rhonchi no added sound Abdomen soft no rebound no guarding ROM intact no focal deficit Neurologically intact moving 4 limbs equally No lower extremity edema  Data Reviewed: I have personally reviewed following labs and imaging studies  BUNs/creatinine 16/0.8 Sodium 133 Hemoglobin 13 WBC 7 Platelet 160  COVID-19 Labs  No results for input(s): DDIMER, FERRITIN, LDH, CRP in the last 72 hours.  Lab Results  Component Value Date   [place] NEGATIVE 03/14/2020   [place] NEGATIVE 06/16/2019   [place] NEGATIVE 01/06/2019   [place] NEGATIVE 12/20/2018     Radiology Studies: DG Chest 2 View  Result Date: 03/14/2020 CLINICAL DATA:  Chest pain and shortness of breath EXAM: CHEST - 2 VIEW COMPARISON:  Jun 16, 2019 chest radiograph and January 24, 2020 chest CT FINDINGS: There is stable scarring in the right upper and right lower lobe regions as well as in the left perihilar region with retraction in the left perihilar region, stable. No edema or airspace opacity is evident. Heart size is normal. Distortion of pulmonary  vascularity is stable. No overt adenopathy is evident by radiography. There is aortic atherosclerosis. There is degenerative change in the thoracic spine. IMPRESSION: Areas of scarring as well as left perihilar retraction, stable. No appreciable new opacity. Stable cardiac silhouette. Aortic Atherosclerosis (ICD10-I70.0). Electronically Signed   By: Lowella Grip III M.D.   On: 03/14/2020 10:06   CT Angio Chest PE W and/or Wo Contrast  Result Date: 03/14/2020 CLINICAL DATA:  Pleuritic chest pain. History of pulmonary embolism with missed anticoagulation medication for 3 days. History of non-small cell right lung cancer status post chemotherapy and radiation therapy. EXAM: CT ANGIOGRAPHY CHEST WITH CONTRAST TECHNIQUE: Multidetector CT imaging of the chest was performed using the standard protocol during bolus administration of intravenous contrast. Multiplanar CT image reconstructions and MIPs were obtained to evaluate the vascular anatomy. CONTRAST:  69mL OMNIPAQUE IOHEXOL 350 MG/ML SOLN COMPARISON:  01/24/2020 chest CT. FINDINGS: Cardiovascular: The study is moderate quality for the evaluation of pulmonary embolism, with some motion degradation. Acute on chronic expansile pulmonary embolism in right lower lobe now nearly occluding the right lower lobe pulmonary arterial tree to the lobar level (series 5/image 134). No additional sites of pulmonary embolism. Mild atherosclerotic nonaneurysmal thoracic aorta. Top-normal caliber main pulmonary artery (3.3 cm diameter), stable. Normal heart size. No significant pericardial fluid/thickening. RV/LV ratio 1.0, slightly elevated. Mediastinum/Nodes: No discrete thyroid nodules. Unremarkable esophagus. No axillary adenopathy. No pathologically enlarged mediastinal or hilar nodes. Lungs/Pleura: No pneumothorax. Trace dependent right pleural effusion, not appreciably changed. No left pleural effusion. Moderate centrilobular and paraseptal emphysema with mild diffuse  bronchial wall thickening and saber sheath trachea. Right perihilar masslike fibrosis measures 5.8 x 3.3 cm (series 6/image 53), previously 5.8 x 3.7 cm, not appreciably changed. Dense consolidation, volume loss, bronchiectasis and distortion in the medial right lower lobe is not substantially changed, compatible with a combination of radiation fibrosis and pre-existing postinfectious scarring. No acute interval consolidative airspace disease or new significant pulmonary nodules. Upper abdomen: Stable 2.1 cm left adrenal adenoma with density -5 HU. Stable 1.4 cm right adrenal adenoma with density 8 HU. Musculoskeletal: No aggressive appearing focal osseous lesions. Moderate thoracic spondylosis. Review of the MIP images confirms the above findings. IMPRESSION: 1. Acute on chronic expansile right lower lobe pulmonary embolism now nearly occluding the right lower lobe pulmonary arterial tree to the lobar level. No additional sites of pulmonary embolism. 2. Positive for acute PE with CTevidence of right heart strain (RV/LV Ratio = 1.0) consistent with at least submassive (intermediate risk) PE. The presence of right heart strain has been associated with an increased risk of morbidity and mortality. 3. Stable post-treatment changes in the right hemithorax. No findings of metastatic disease in the chest. 4. Stable trace dependent right pleural effusion. 5. Stable bilateral adrenal adenomas. 6. Aortic Atherosclerosis (ICD10-I70.0) and Emphysema (ICD10-J43.9). Critical Value/emergent results were called  by telephone at the time of interpretation on 03/14/2020 at 12:06 pm to provider CLAUDIA GIBBONS , who verbally acknowledged these results. Electronically Signed   By: Ilona Sorrel M.D.   On: 03/14/2020 12:08     Scheduled Meds: . atorvastatin  20 mg Oral Daily  . famotidine  20 mg Oral BID  . loratadine  10 mg Oral Daily  . umeclidinium bromide  2 puff Inhalation Daily   Continuous Infusions: . heparin 1,500  Units/hr (03/15/20 0353)     LOS: 0 days   Time spent: [place], MD [facility] To contact the attending provider between 7A-7P or the covering provider during after hours 7P-7A, please log into the web site [email or web address] and access using universal [name] password for that web site. If you do not have the password, please call the hospital operator.  03/15/2020, 7:43 AM

## 2020-03-15 NOTE — Telephone Encounter (Signed)
I received a phone call from Mr. Bramble this am.  He states he is back in the hospital at this time due to a PE.  He states he does not need anything but wanted to let us know. I will update Dr. Julien Nordmann and his nurse.

## 2020-03-15 NOTE — ED Notes (Signed)
ED TO INPATIENT HANDOFF REPORT  Name/Age/Gender Philip [name] 65 y.o. male  Code Status    Code Status Orders  (From admission, onward)         Start     Ordered   03/14/20 1325  Full code  Continuous        03/14/20 1324        Code Status History    Date Active Date Inactive Code Status Order ID Comments User Context   06/16/2019 2234 06/19/2019 2344 Full Code [id number]  Florencia Reasons, MD Inpatient   01/09/2019 1701 01/11/2019 1929 Full Code [id number]  Garner Nash, DO Inpatient   Advance Care Planning Activity    Advance Directive Documentation   Flowsheet Row Most Recent Value  Type of Advance Directive Healthcare Power of Attorney, Living will  Pre-existing out of facility DNR order (yellow form or pink MOST form) --  "MOST" Form in Place? --      Home/SNF/Other Home  Chief Complaint Acute pulmonary embolism ([facility]) [I26.99]  Level of Care/Admitting Diagnosis ED Disposition    ED Disposition Condition [place]: [place] [[id number]]  Level of Care: Telemetry [5]  Admit to tele based on following criteria: Monitor for Ischemic changes  May admit patient to [facility] or [facility] if equivalent level of care is available:: No  Covid Evaluation: Asymptomatic Screening Protocol (No Symptoms)  Diagnosis: Acute pulmonary embolism [facility]) [[id number]]  Admitting Physician: Harold Hedge [[id number]]  Attending Physician: [name] [phone]  Estimated length of stay: past midnight tomorrow  Certification:: I certify this patient will need inpatient services for at least 2 midnights       Medical History Past Medical History:  Diagnosis Date  . Hyperlipidemia   . lung ca dx'd 12/2018  . Pneumonia   . Pulmonary embolus ([place])   . Tobacco abuse     Allergies Allergies  Allergen Reactions  . Norco [Hydrocodone-Acetaminophen] Other (See Comments)    Made pt feel jittery     IV Location/Drains/Wounds Patient  Lines/Drains/Airways Status    Active Line/Drains/Airways    Name Placement date Placement time Site Days   Peripheral IV 03/14/20 Right Antecubital 03/14/20  1034  Antecubital  1   Peripheral IV 03/14/20 Anterior;Left;Upper Arm 03/14/20  1610  Arm  1   Incision (Closed) 01/09/19 N/A 01/09/19  0952  -- 431          Labs/Imaging Results for orders placed or performed during the hospital encounter of 03/14/20 (from the past 48 hour(s))  Basic metabolic panel     Status: Abnormal   Collection Time: 03/14/20 10:38 AM  Result Value Ref Range   Sodium 133 (L) 135 - 145 mmol/L   Potassium 4.1 3.5 - 5.1 mmol/L   Chloride 101 98 - 111 mmol/L   CO2 22 22 - 32 mmol/L   Glucose, Bld 108 (H) 70 - 99 mg/dL    Comment: Glucose reference range applies only to samples taken after fasting for at least 8 hours.   BUN 15 8 - 23 mg/dL   Creatinine, Ser 0.86 0.61 - 1.24 mg/dL   Calcium 8.7 (L) 8.9 - 10.3 mg/dL   GFR, Estimated >60 >60 mL/min    Comment: (NOTE) Calculated using the CKD-EPI Creatinine Equation (2021)    Anion gap 10 5 - 15    Comment: Performed at [facility], [place] [address], [place], [place] [address]  CBC  Status: None   Collection Time: 03/14/20 10:38 AM  Result Value Ref Range   WBC 10.0 4.0 - 10.5 K/uL   RBC 4.62 4.22 - 5.81 MIL/uL   Hemoglobin 14.7 13.0 - 17.0 g/dL   HCT 42.9 39.0 - 52.0 %   MCV 92.9 80.0 - 100.0 fL   MCH 31.8 26.0 - 34.0 pg   MCHC 34.3 30.0 - 36.0 g/dL   RDW 12.7 11.5 - 15.5 %   Platelets 176 150 - 400 K/uL   nRBC 0.0 0.0 - 0.2 %    Comment: Performed at [facility], [place] [address], [place], [place] [address]  Troponin I (High Sensitivity)     Status: None   Collection Time: 03/14/20 10:38 AM  Result Value Ref Range   Troponin I (High Sensitivity) 4 <18 ng/L    Comment: (NOTE) Elevated high sensitivity troponin I (hsTnI) values and significant  changes across serial measurements may suggest ACS but many  other  chronic and acute conditions are known to elevate hsTnI results.  Refer to the "Links" section for chest pain algorithms and additional  guidance. Performed at [facility], [place] [address] [place], [place] [address]   Resp Panel by RT-PCR (Flu A&B, Covid) Nasopharyngeal Swab     Status: None   Collection Time: 03/14/20 10:38 AM   Specimen: Nasopharyngeal Swab; Nasopharyngeal(NP) swabs in vial transport medium  Result Value Ref Range   SARS Coronavirus 2 by RT PCR NEGATIVE NEGATIVE    Comment: (NOTE) SARS-CoV-2 target nucleic acids are NOT DETECTED.  The SARS-CoV-2 RNA is generally detectable in upper respiratory specimens during the acute phase of infection. The lowest concentration of SARS-CoV-2 viral copies this assay can detect is 138 copies/mL. A negative result does not preclude SARS-Cov-2 infection and should not be used as the sole basis for treatment or other patient management decisions. A negative result may occur with  improper specimen collection/handling, submission of specimen other than nasopharyngeal swab, presence of viral mutation(s) within the areas targeted by this assay, and inadequate number of viral copies(<138 copies/mL). A negative result must be combined with clinical observations, patient history, and epidemiological information. The expected result is Negative.  Fact Sheet for Patients:  EntrepreneurPulse.com.au  Fact Sheet for Healthcare Providers:  IncredibleEmployment.be  This test is no t yet approved or cleared by the [place] FDA and  has been authorized for detection and/or diagnosis of SARS-CoV-2 by FDA under an Emergency Use Authorization (EUA). This EUA will remain  in effect (meaning this test can be used) for the duration of the COVID-19 declaration under Section 564(b)(1) of the Act, 21 U.S.C.section 360bbb-3(b)(1), unless the authorization is terminated  or revoked  sooner.       Influenza A by PCR NEGATIVE NEGATIVE   Influenza B by PCR NEGATIVE NEGATIVE    Comment: (NOTE) The Xpert Xpress SARS-CoV-2/FLU/RSV plus assay is intended as an aid in the diagnosis of influenza from Nasopharyngeal swab specimens and should not be used as a sole basis for treatment. Nasal washings and aspirates are unacceptable for Xpert Xpress SARS-CoV-2/FLU/RSV testing.  Fact Sheet for Patients: EntrepreneurPulse.com.au  Fact Sheet for Healthcare Providers: IncredibleEmployment.be  This test is not yet approved or cleared by the [place] FDA and has been authorized for detection and/or diagnosis of SARS-CoV-2 by FDA under an Emergency Use Authorization (EUA). This EUA will remain in effect (meaning this test can be used) for the duration of the COVID-19 declaration under Section 564(b)(1)  of the Act, 21 U.S.C. section 360bbb-3(b)(1), unless the authorization is terminated or revoked.  Performed at [facility], [place] [address] [place], [place] [address]   Troponin I (High Sensitivity)     Status: None   Collection Time: 03/14/20 12:32 PM  Result Value Ref Range   Troponin I (High Sensitivity) 5 <18 ng/L    Comment: (NOTE) Elevated high sensitivity troponin I (hsTnI) values and significant  changes across serial measurements may suggest ACS but many other  chronic and acute conditions are known to elevate hsTnI results.  Refer to the "Links" section for chest pain algorithms and additional  guidance. Performed at [facility], [place] [address] [place], [place] [address]   Heparin level (unfractionated)     Status: None   Collection Time: 03/14/20  8:00 PM  Result Value Ref Range   Heparin Unfractionated 0.40 0.30 - 0.70 IU/mL    Comment: (NOTE) If heparin results are below expected values, and patient dosage has  been confirmed, suggest follow up testing of antithrombin III  levels. Performed at [facility], [place] [address] [place], [place] [address]   APTT     Status: Abnormal   Collection Time: 03/14/20  8:00 PM  Result Value Ref Range   aPTT 118 (H) 24 - 36 seconds    Comment:        IF BASELINE aPTT IS ELEVATED, SUGGEST PATIENT RISK ASSESSMENT BE USED TO DETERMINE APPROPRIATE ANTICOAGULANT THERAPY. Performed at [facility], [place] [address], [place], [place] [address]   Heparin level (unfractionated)     Status: None   Collection Time: 03/15/20  2:30 AM  Result Value Ref Range   Heparin Unfractionated 0.32 0.30 - 0.70 IU/mL    Comment: (NOTE) If heparin results are below expected values, and patient dosage has  been confirmed, suggest follow up testing of antithrombin III levels. Performed at [facility], [place] [address], [place], [place] [address]   Basic metabolic panel     Status: Abnormal   Collection Time: 03/15/20  5:00 AM  Result Value Ref Range   Sodium 135 135 - 145 mmol/L   Potassium 4.6 3.5 - 5.1 mmol/L   Chloride 101 98 - 111 mmol/L   CO2 27 22 - 32 mmol/L   Glucose, Bld 101 (H) 70 - 99 mg/dL    Comment: Glucose reference range applies only to samples taken after fasting for at least 8 hours.   BUN 16 8 - 23 mg/dL   Creatinine, Ser 0.88 0.61 - 1.24 mg/dL   Calcium 8.3 (L) 8.9 - 10.3 mg/dL   GFR, Estimated >60 >60 mL/min    Comment: (NOTE) Calculated using the CKD-EPI Creatinine Equation (2021)    Anion gap 7 5 - 15    Comment: Performed at [facility], [place] [address] [place], [place] [address]  CBC     Status: None   Collection Time: 03/15/20  5:00 AM  Result Value Ref Range   WBC 7.7 4.0 - 10.5 K/uL   RBC 4.22 4.22 - 5.81 MIL/uL   Hemoglobin 13.2 13.0 - 17.0 g/dL   HCT 40.1 39.0 - 52.0 %   MCV 95.0 80.0 - 100.0 fL   MCH 31.3 26.0 - 34.0 pg   MCHC 32.9 30.0 - 36.0 g/dL   RDW 12.9 11.5 - 15.5 %   Platelets 160 150 - 400 K/uL   nRBC 0.0 0.0 -  0.2 %  Comment: Performed at [facility], [place] [address] [place], [place] [address]   DG Chest 2 View  Result Date: 03/14/2020 CLINICAL DATA:  Chest pain and shortness of breath EXAM: CHEST - 2 VIEW COMPARISON:  Jun 16, 2019 chest radiograph and January 24, 2020 chest CT FINDINGS: There is stable scarring in the right upper and right lower lobe regions as well as in the left perihilar region with retraction in the left perihilar region, stable. No edema or airspace opacity is evident. Heart size is normal. Distortion of pulmonary vascularity is stable. No overt adenopathy is evident by radiography. There is aortic atherosclerosis. There is degenerative change in the thoracic spine. IMPRESSION: Areas of scarring as well as left perihilar retraction, stable. No appreciable new opacity. Stable cardiac silhouette. Aortic Atherosclerosis (ICD10-I70.0). Electronically Signed   By: Lowella Grip III M.D.   On: 03/14/2020 10:06   CT Angio Chest PE W and/or Wo Contrast  Result Date: 03/14/2020 CLINICAL DATA:  Pleuritic chest pain. History of pulmonary embolism with missed anticoagulation medication for 3 days. History of non-small cell right lung cancer status post chemotherapy and radiation therapy. EXAM: CT ANGIOGRAPHY CHEST WITH CONTRAST TECHNIQUE: Multidetector CT imaging of the chest was performed using the standard protocol during bolus administration of intravenous contrast. Multiplanar CT image reconstructions and MIPs were obtained to evaluate the vascular anatomy. CONTRAST:  76mL OMNIPAQUE IOHEXOL 350 MG/ML SOLN COMPARISON:  01/24/2020 chest CT. FINDINGS: Cardiovascular: The study is moderate quality for the evaluation of pulmonary embolism, with some motion degradation. Acute on chronic expansile pulmonary embolism in right lower lobe now nearly occluding the right lower lobe pulmonary arterial tree to the lobar level (series 5/image 134). No additional sites of pulmonary  embolism. Mild atherosclerotic nonaneurysmal thoracic aorta. Top-normal caliber main pulmonary artery (3.3 cm diameter), stable. Normal heart size. No significant pericardial fluid/thickening. RV/LV ratio 1.0, slightly elevated. Mediastinum/Nodes: No discrete thyroid nodules. Unremarkable esophagus. No axillary adenopathy. No pathologically enlarged mediastinal or hilar nodes. Lungs/Pleura: No pneumothorax. Trace dependent right pleural effusion, not appreciably changed. No left pleural effusion. Moderate centrilobular and paraseptal emphysema with mild diffuse bronchial wall thickening and saber sheath trachea. Right perihilar masslike fibrosis measures 5.8 x 3.3 cm (series 6/image 53), previously 5.8 x 3.7 cm, not appreciably changed. Dense consolidation, volume loss, bronchiectasis and distortion in the medial right lower lobe is not substantially changed, compatible with a combination of radiation fibrosis and pre-existing postinfectious scarring. No acute interval consolidative airspace disease or new significant pulmonary nodules. Upper abdomen: Stable 2.1 cm left adrenal adenoma with density -5 HU. Stable 1.4 cm right adrenal adenoma with density 8 HU. Musculoskeletal: No aggressive appearing focal osseous lesions. Moderate thoracic spondylosis. Review of the MIP images confirms the above findings. IMPRESSION: 1. Acute on chronic expansile right lower lobe pulmonary embolism now nearly occluding the right lower lobe pulmonary arterial tree to the lobar level. No additional sites of pulmonary embolism. 2. Positive for acute PE with CTevidence of right heart strain (RV/LV Ratio = 1.0) consistent with at least submassive (intermediate risk) PE. The presence of right heart strain has been associated with an increased risk of morbidity and mortality. 3. Stable post-treatment changes in the right hemithorax. No findings of metastatic disease in the chest. 4. Stable trace dependent right pleural effusion. 5. Stable  bilateral adrenal adenomas. 6. Aortic Atherosclerosis (ICD10-I70.0) and Emphysema (ICD10-J43.9). Critical Value/emergent results were called by telephone at the time of interpretation on 03/14/2020 at 12:06 pm to provider CLAUDIA GIBBONS ,  who verbally acknowledged these results. Electronically Signed   By: Ilona Sorrel M.D.   On: 03/14/2020 12:08    Pending Labs Unresulted Labs (From admission, onward)          Start     Ordered   03/16/20 0500  Heparin level (unfractionated)  Daily,   R      03/15/20 0315   03/15/20 1791  Basic metabolic panel  Daily,   R      03/14/20 1324   03/15/20 0500  CBC  Daily,   R      03/14/20 1324   03/14/20 1325  HIV Antibody (routine testing w rflx)  (HIV Antibody (Routine testing w reflex) panel)  Once,   STAT        03/14/20 1324          Vitals/Pain Today's Vitals   03/15/20 1700 03/15/20 1900 03/15/20 2100 03/15/20 2200  BP: 92/72 103/82 100/85 110/73  Pulse: 92 79 85 83  Resp: (!) 34 20 19 20   Temp:      TempSrc:      SpO2: 94% 95% 94% 97%  Weight:      Height:      PainSc:        Isolation Precautions No active isolations  Medications Medications  atorvastatin (LIPITOR) tablet 20 mg (20 mg Oral Given 03/15/20 1116)  famotidine (PEPCID) tablet 20 mg (20 mg Oral Given 03/15/20 2205)  albuterol (VENTOLIN HFA) 108 (90 Base) MCG/ACT inhaler 2 puff (has no administration in time range)  loratadine (CLARITIN) tablet 10 mg (10 mg Oral Given 03/15/20 1116)  umeclidinium bromide (INCRUSE ELLIPTA) 62.5 MCG/INH 2 puff (2 puffs Inhalation Given 03/15/20 0821)  acetaminophen (TYLENOL) tablet 650 mg (has no administration in time range)    Or  acetaminophen (TYLENOL) suppository 650 mg (has no administration in time range)  HYDROcodone-acetaminophen (NORCO/VICODIN) 5-325 MG per tablet 1-2 tablet (2 tablets Oral Given 03/15/20 1647)  morphine 2 MG/ML injection 2 mg (2 mg Intravenous Given 03/15/20 1053)  polyethylene glycol (MIRALAX / GLYCOLAX) packet  17 g (has no administration in time range)  0.9 %  sodium chloride infusion ( Intravenous Stopped 03/15/20 0241)  hydrALAZINE (APRESOLINE) injection 10 mg (has no administration in time range)  heparin ADULT infusion 100 units/mL (25000 units/26mL) (1,500 Units/hr Intravenous Rate/Dose Change 03/15/20 0353)  dextromethorphan-guaiFENesin (MUCINEX DM) 30-600 MG per 12 hr tablet 1 tablet (has no administration in time range)  morphine 4 MG/ML injection 6 mg (6 mg Intravenous Given 03/14/20 1034)  iohexol (OMNIPAQUE) 350 MG/ML injection 100 mL (80 mLs Intravenous Contrast Given 03/14/20 1139)  benzonatate (TESSALON) capsule 200 mg (200 mg Oral Given 03/14/20 1415)  heparin bolus via infusion 5,000 Units (5,000 Units Intravenous Bolus from Bag 03/14/20 1414)    Mobility walks

## 2020-03-16 LAB — CBC
HCT: 39.8 % (ref 39.0–52.0)
Hemoglobin: 13.6 g/dL (ref 13.0–17.0)
MCH: 32 pg (ref 26.0–34.0)
MCHC: 34.2 g/dL (ref 30.0–36.0)
MCV: 93.6 fL (ref 80.0–100.0)
Platelets: 168 10*3/uL (ref 150–400)
RBC: 4.25 MIL/uL (ref 4.22–5.81)
RDW: 12.4 % (ref 11.5–15.5)
WBC: 6.4 10*3/uL (ref 4.0–10.5)
nRBC: 0 % (ref 0.0–0.2)

## 2020-03-16 LAB — BASIC METABOLIC PANEL
Anion gap: 8 (ref 5–15)
BUN: 16 mg/dL (ref 8–23)
CO2: 26 mmol/L (ref 22–32)
Calcium: 8.5 mg/dL — ABNORMAL LOW (ref 8.9–10.3)
Chloride: 100 mmol/L (ref 98–111)
Creatinine, Ser: 0.89 mg/dL (ref 0.61–1.24)
GFR, Estimated: >60 mL/min (ref >60)
Glucose, Bld: 104 mg/dL — ABNORMAL HIGH (ref 70–99)
Potassium: 4.1 mmol/L (ref 3.5–5.1)
Sodium: 134 mmol/L — ABNORMAL LOW (ref 135–145)

## 2020-03-16 LAB — HEPARIN LEVEL (UNFRACTIONATED): Heparin Unfractionated: 0.26 IU/mL — ABNORMAL LOW (ref 0.30–0.70)

## 2020-03-16 MED ORDER — HEPARIN (PORCINE) 25000 UT/250ML-% IV SOLN
1700.0000 [IU]/h | INTRAVENOUS | Status: AC
  Filled 2020-03-16: qty 250

## 2020-03-16 MED ORDER — RIVAROXABAN 20 MG PO TABS
20.0000 mg | ORAL_TABLET | Freq: Every day | ORAL | Status: DC
  Administered 2020-03-16: 20 mg via ORAL
  Filled 2020-03-16: qty 1

## 2020-03-16 NOTE — Progress Notes (Signed)
PROGRESS NOTE   Philip [name]  YQI:[id number] DOB: August 23, 1955 DOA: 03/14/2020 PCP: [name], MD  Brief Narrative:  65 year old male chronic tinnitus Prior smoker COPD stage III-lung adeno CA diagnosed 01/22/2019 no XRT secondary to severe esophagitis finished 6 cycles of chemo/immunotherapy Pulmonary MAI infection right lower lobe Right lower lobe PE diagnosed 06/16/2019 Allegedly missed 3 days anticoagulation-also car drive 3 hours [place]-developed DOE PND Came to emergency room CT chest showed acute on chronic RLL PE blocking right lower lobe pulmonary arterial tree desatted to 80s  Assessment & Plan:   Principal Problem:   Acute pulmonary embolism ([place]) Active Problems:   Adenocarcinoma of right lung, stage 3 (HCC)   History of pulmonary embolus (PE)   1. Acute hypoxic respiratory failure 2. 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 Given hemodynamic stability hold echo b. Continue heparin GTT but ransition back to Xarelto starter pack later today c. Reinforced with him strict precautions to take blood thinner-probably will need lifelong given his cancer  3. COPD stage III on chronic oxygen a. Continue albuterol every 4 as needed, Stiolto Respimat 2 puffs daily and Allegra in addition to Spiriva 2 puffs daily b. Ambulate in hallway today 4. [place] CA lung 01/2019 on immunotherapy a. Per oncologist in the outpatient setting 5. Prior MAI  DVT prophylaxis: On IV heparin GTT Code Status: Full Family Communication: None Disposition:  Status is: Observation  The patient will require care spanning > 2 midnights and should be moved to inpatient because: Hemodynamically unstable, Unsafe d/c plan, IV treatments appropriate due to intensity of illness or inability to take PO and Inpatient level of care appropriate due to severity of illness  Dispo: The patient is from: Home              Anticipated d/c is to: Home              Anticipated d/c date is: 1 day               Patient currently is not medically stable to d/c.   Difficult to place patient No       Consultants:     Procedures:   Antimicrobials:     Subjective:  Alert coherent some discomfort in chest no fever no chills Eating drinking has not been up and about No reports of bleeding   Objective: Vitals:   03/15/20 2300 03/16/20 0000 03/16/20 0054 03/16/20 0506  BP: 94/75 100/74 121/81 107/74  Pulse: 76 77 83 75  Resp: 16 (!) 21 20 16   Temp:  97.8 F (36.6 C) 97.7 F (36.5 C) 98.1 F (36.7 C)  TempSrc:  Oral Oral Oral  SpO2: 92% 97% 94% 99%  Weight:      Height:        Intake/Output Summary (Last 24 hours) at 03/16/2020 0903 Last data filed at 03/15/2020 1647 Gross per 24 hour  Intake --  Output 500 ml  Net -500 ml   Filed Weights   03/14/20 0939 03/14/20 1039  Weight: 85.7 kg 81.6 kg    Examination:  Pleasant no distress Chest clear no rales rhonchi Abdomen soft nontender no rebound Neurologically grossly intact  Data Reviewed: I have personally reviewed following labs and imaging studies  BUNs/creatinine 16/0.8 Sodium  134 Hemoglobin 13 WBC 6  Platelet 168  COVID-19 Labs  No results for input(s): DDIMER, FERRITIN, LDH, CRP in the last 72 hours.  Lab Results  Component Value Date   SARSCOV2NAA  NEGATIVE 03/14/2020   [place] NEGATIVE 06/16/2019   [place] NEGATIVE 01/06/2019   [place] NEGATIVE 12/20/2018     Radiology Studies: DG Chest 2 View  Result Date: 03/14/2020 CLINICAL DATA:  Chest pain and shortness of breath EXAM: CHEST - 2 VIEW COMPARISON:  Jun 16, 2019 chest radiograph and January 24, 2020 chest CT FINDINGS: There is stable scarring in the right upper and right lower lobe regions as well as in the left perihilar region with retraction in the left perihilar region, stable. No edema or airspace opacity is evident. Heart size is normal. Distortion of pulmonary vascularity is stable. No overt adenopathy is evident by  radiography. There is aortic atherosclerosis. There is degenerative change in the thoracic spine. IMPRESSION: Areas of scarring as well as left perihilar retraction, stable. No appreciable new opacity. Stable cardiac silhouette. Aortic Atherosclerosis (ICD10-I70.0). Electronically Signed   By: Lowella Grip III M.D.   On: 03/14/2020 10:06   CT Angio Chest PE W and/or Wo Contrast  Result Date: 03/14/2020 CLINICAL DATA:  Pleuritic chest pain. History of pulmonary embolism with missed anticoagulation medication for 3 days. History of non-small cell right lung cancer status post chemotherapy and radiation therapy. EXAM: CT ANGIOGRAPHY CHEST WITH CONTRAST TECHNIQUE: Multidetector CT imaging of the chest was performed using the standard protocol during bolus administration of intravenous contrast. Multiplanar CT image reconstructions and MIPs were obtained to evaluate the vascular anatomy. CONTRAST:  66mL OMNIPAQUE IOHEXOL 350 MG/ML SOLN COMPARISON:  01/24/2020 chest CT. FINDINGS: Cardiovascular: The study is moderate quality for the evaluation of pulmonary embolism, with some motion degradation. Acute on chronic expansile pulmonary embolism in right lower lobe now nearly occluding the right lower lobe pulmonary arterial tree to the lobar level (series 5/image 134). No additional sites of pulmonary embolism. Mild atherosclerotic nonaneurysmal thoracic aorta. Top-normal caliber main pulmonary artery (3.3 cm diameter), stable. Normal heart size. No significant pericardial fluid/thickening. RV/LV ratio 1.0, slightly elevated. Mediastinum/Nodes: No discrete thyroid nodules. Unremarkable esophagus. No axillary adenopathy. No pathologically enlarged mediastinal or hilar nodes. Lungs/Pleura: No pneumothorax. Trace dependent right pleural effusion, not appreciably changed. No left pleural effusion. Moderate centrilobular and paraseptal emphysema with mild diffuse bronchial wall thickening and saber sheath trachea. Right  perihilar masslike fibrosis measures 5.8 x 3.3 cm (series 6/image 53), previously 5.8 x 3.7 cm, not appreciably changed. Dense consolidation, volume loss, bronchiectasis and distortion in the medial right lower lobe is not substantially changed, compatible with a combination of radiation fibrosis and pre-existing postinfectious scarring. No acute interval consolidative airspace disease or new significant pulmonary nodules. Upper abdomen: Stable 2.1 cm left adrenal adenoma with density -5 HU. Stable 1.4 cm right adrenal adenoma with density 8 HU. Musculoskeletal: No aggressive appearing focal osseous lesions. Moderate thoracic spondylosis. Review of the MIP images confirms the above findings. IMPRESSION: 1. Acute on chronic expansile right lower lobe pulmonary embolism now nearly occluding the right lower lobe pulmonary arterial tree to the lobar level. No additional sites of pulmonary embolism. 2. Positive for acute PE with CTevidence of right heart strain (RV/LV Ratio = 1.0) consistent with at least submassive (intermediate risk) PE. The presence of right heart strain has been associated with an increased risk of morbidity and mortality. 3. Stable post-treatment changes in the right hemithorax. No findings of metastatic disease in the chest. 4. Stable trace dependent right pleural effusion. 5. Stable bilateral adrenal adenomas. 6. Aortic Atherosclerosis (ICD10-I70.0) and Emphysema (ICD10-J43.9). Critical Value/emergent results were called by telephone at the time of interpretation on 03/14/2020  at 12:06 pm to provider CLAUDIA GIBBONS , who verbally acknowledged these results. Electronically Signed   By: Ilona Sorrel M.D.   On: 03/14/2020 12:08     Scheduled Meds: . atorvastatin  20 mg Oral Daily  . famotidine  20 mg Oral BID  . loratadine  10 mg Oral Daily  . umeclidinium bromide  2 puff Inhalation Daily   Continuous Infusions: . heparin 1,700 Units/hr (03/16/20 0757)     LOS: 1 day   Time spent:  [place], MD [facility] To contact the attending provider between 7A-7P or the covering provider during after hours 7P-7A, please log into the web site [email or web address] and access using universal [place] password for that web site. If you do not have the password, please call the hospital operator.  03/16/2020, 9:03 AM

## 2020-03-16 NOTE — Plan of Care (Signed)
  Problem: Clinical Measurements: Goal: Respiratory complications will improve Outcome: Completed/Met   Problem: Clinical Measurements: Goal: Diagnostic test results will improve Outcome: Completed/Met   Problem: Clinical Measurements: Goal: Will remain free from infection Outcome: Completed/Met   Problem: Clinical Measurements: Goal: Ability to maintain clinical measurements within normal limits will improve Outcome: Completed/Met   Problem: Health Behavior/Discharge Planning: Goal: Ability to manage health-related needs will improve Outcome: Completed/Met

## 2020-03-16 NOTE — Progress Notes (Signed)
[name] for transition back to home Xarelto  Indication: pulmonary embolus  Allergies  Allergen Reactions  . Norco [Hydrocodone-Acetaminophen] Other (See Comments)    Made pt feel jittery     Patient Measurements: Height: 5' 6.5" (168.9 cm) Weight: 81.6 kg (180 lb) IBW/kg (Calculated) : 64.95 Heparin Dosing Weight: 81.3kg   Vital Signs: Temp: 98.1 F (36.7 C) (02/12 0506) Temp Source: Oral (02/12 0506) BP: 107/74 (02/12 0506) Pulse Rate: 75 (02/12 0506)  Labs: Recent Labs    03/14/20 1038 03/14/20 1232 03/14/20 2000 03/15/20 0230 03/15/20 0500 03/16/20 0614  HGB 14.7  --   --   --  13.2 13.6  HCT 42.9  --   --   --  40.1 39.8  PLT 176  --   --   --  160 168  APTT  --   --  118*  --   --   --   HEPARINUNFRC  --   --  0.40 0.32  --  0.26*  CREATININE 0.86  --   --   --  0.88 0.89  TROPONINIHS 4 5  --   --   --   --    Estimated Creatinine Clearance: 84.9 mL/min (by C-G formula based on SCr of 0.89 mg/dL).  Medical History: Past Medical History:  Diagnosis Date  . Hyperlipidemia   . lung ca dx'd 12/2018  . Pneumonia   . Pulmonary embolus ([place])   . Tobacco abuse    Medications:  Medications Prior to Admission  Medication Sig Dispense Refill Last Dose  . albuterol (VENTOLIN HFA) 108 (90 Base) MCG/ACT inhaler Inhale 2 puffs into the lungs every 4 (four) hours as needed for wheezing or shortness of breath. 18 g 6 Past Week at Unknown time  . atorvastatin (LIPITOR) 20 MG tablet Take 20 mg by mouth daily.   03/14/2020 at Unknown time  . famotidine (PEPCID) 20 MG tablet TAKE 1 TABLET BY MOUTH TWICE A DAY (Patient taking differently: Take 20 mg by mouth 2 (two) times daily.) 180 tablet 1 03/14/2020 at Unknown time  . fexofenadine (ALLEGRA) 180 MG tablet Take 180 mg by mouth daily.   03/14/2020 at Unknown time  . oxyCODONE-acetaminophen (PERCOCET/ROXICET) 5-325 MG tablet Take 0.5 tablets by mouth every 6 (six) hours as needed for  severe pain.   03/13/2020 at Unknown time  . rivaroxaban (XARELTO) 20 MG TABS tablet Take 1 tablet (20 mg total) by mouth daily with supper. 30 tablet 11 03/10/2020 at 2000  . SPIRIVA RESPIMAT 1.25 MCG/ACT AERS Inhale 2 puffs into the lungs daily.   03/14/2020 at Unknown time  . Nebulizers (COMPRESSOR/NEBULIZER) MISC 1 application by Does not apply route every 4 (four) hours as needed. 1 each 0   . Tiotropium Bromide-Olodaterol (STIOLTO RESPIMAT) 2.5-2.5 MCG/ACT AERS Inhale 2 puffs into the lungs daily. (Patient not taking: Reported on 03/14/2020) 3 each 3 Not Taking at Unknown time   Scheduled:  . atorvastatin  20 mg Oral Daily  . famotidine  20 mg Oral BID  . loratadine  10 mg Oral Daily  . rivaroxaban  20 mg Oral Q supper  . umeclidinium bromide  2 puff Inhalation Daily   Infusions:  . 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 Pt reports missing past 2-3 days of Xarelto due to his prescription running out. 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 Pharmacy has been consulted for heparin dosing.    Heparin level now SUBtherapeutic (0.26) on current IV heparin rate of 1500 units/hr  CBC stable  No bleeding or issues noted per RN  Goal of Therapy:   Heparin level 0.3-0.7 units/ml aPTT 66-102 seconds Monitor platelets by anticoagulation protocol: Yes   Plan:   Transition back to Xarelto 20mg  daily today 2/12, first dose 12n  Discontinue Heparin at 12N  Further dose Xarelto given daily with supper from 2/13  Minda Ditto PharmD 03/16/2020, 9:26 AM

## 2020-03-16 NOTE — Progress Notes (Signed)
ANTICOAGULATION CONSULT NOTE - Follow Up Consult  Pharmacy Consult for heparin  Indication: pulmonary embolus  Allergies  Allergen Reactions  . Norco [Hydrocodone-Acetaminophen] Other (See Comments)    Made pt feel jittery     Patient Measurements: Height: 5' 6.5" (168.9 cm) Weight: 81.6 kg (180 lb) IBW/kg (Calculated) : 64.95 Heparin Dosing Weight: 81.3kg   Vital Signs: Temp: 98.1 F (36.7 C) (02/12 0506) Temp Source: Oral (02/12 0506) BP: 107/74 (02/12 0506) Pulse Rate: 75 (02/12 0506)  Labs: Recent Labs    03/14/20 1038 03/14/20 1232 03/14/20 2000 03/15/20 0230 03/15/20 0500 03/16/20 0614  HGB 14.7  --   --   --  13.2 13.6  HCT 42.9  --   --   --  40.1 39.8  PLT 176  --   --   --  160 168  APTT  --   --  118*  --   --   --   HEPARINUNFRC  --   --  0.40 0.32  --  0.26*  CREATININE 0.86  --   --   --  0.88 0.89  TROPONINIHS 4 5  --   --   --   --     Estimated Creatinine Clearance: 84.9 mL/min (by C-G formula based on SCr of 0.89 mg/dL).   Medical History: Past Medical History:  Diagnosis Date  . Hyperlipidemia   . lung ca dx'd 12/2018  . Pneumonia   . Pulmonary embolus ([place])   . Tobacco abuse     Medications:  Medications Prior to Admission  Medication Sig Dispense Refill Last Dose  . albuterol (VENTOLIN HFA) 108 (90 Base) MCG/ACT inhaler Inhale 2 puffs into the lungs every 4 (four) hours as needed for wheezing or shortness of breath. 18 g 6 Past Week at Unknown time  . atorvastatin (LIPITOR) 20 MG tablet Take 20 mg by mouth daily.   03/14/2020 at Unknown time  . famotidine (PEPCID) 20 MG tablet TAKE 1 TABLET BY MOUTH TWICE A DAY (Patient taking differently: Take 20 mg by mouth 2 (two) times daily.) 180 tablet 1 03/14/2020 at Unknown time  . fexofenadine (ALLEGRA) 180 MG tablet Take 180 mg by mouth daily.   03/14/2020 at Unknown time  . oxyCODONE-acetaminophen (PERCOCET/ROXICET) 5-325 MG tablet Take 0.5 tablets by mouth every 6 (six) hours as needed for  severe pain.   03/13/2020 at Unknown time  . rivaroxaban (XARELTO) 20 MG TABS tablet Take 1 tablet (20 mg total) by mouth daily with supper. 30 tablet 11 03/10/2020 at 2000  . SPIRIVA RESPIMAT 1.25 MCG/ACT AERS Inhale 2 puffs into the lungs daily.   03/14/2020 at Unknown time  . Nebulizers (COMPRESSOR/NEBULIZER) MISC 1 application by Does not apply route every 4 (four) hours as needed. 1 each 0   . Tiotropium Bromide-Olodaterol (STIOLTO RESPIMAT) 2.5-2.5 MCG/ACT AERS Inhale 2 puffs into the lungs daily. (Patient not taking: Reported on 03/14/2020) 3 each 3 Not Taking at Unknown time   Scheduled:  . atorvastatin  20 mg Oral Daily  . famotidine  20 mg Oral BID  . loratadine  10 mg Oral Daily  . umeclidinium bromide  2 puff Inhalation Daily   Infusions:  . heparin 1,500 Units/hr (03/16/20 0655)   PRN: acetaminophen **OR** acetaminophen, albuterol, dextromethorphan-guaiFENesin, hydrALAZINE, HYDROcodone-acetaminophen, morphine injection, polyethylene glycol Anti-infectives (From admission, onward)   None      Assessment: 65 yo M with hx of NSCL cancer on immunotherapy and PE currently on Xarelto who presents to the  ED with worsening chest pain. Pt reports missing past 2-3 days of Xarelto due to his prescription running out. 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 Pharmacy has been consulted for heparin dosing.    Heparin level now SUBtherapeutic (0.26) on current IV heparin rate of 1500 units/hr  CBC stable  No bleeding or issues noted per RN  Goal of Therapy:   Heparin level 0.3-0.7 units/ml aPTT 66-102 seconds Monitor platelets by anticoagulation protocol: Yes   Plan:   Increase IV heparin from 1500 to 1700 units/hr  Recheck heparin level 6 hours after rate increase  Daily CBC and heparin level   Adrian Saran, PharmD, BCPS 03/16/2020 7:29 AM

## 2020-03-17 ENCOUNTER — Encounter: Payer: Self-pay | Admitting: Family Medicine

## 2020-03-17 LAB — CBC
HCT: 40.3 % (ref 39.0–52.0)
Hemoglobin: 13.7 g/dL (ref 13.0–17.0)
MCH: 31.9 pg (ref 26.0–34.0)
MCHC: 34 g/dL (ref 30.0–36.0)
MCV: 93.7 fL (ref 80.0–100.0)
Platelets: 197 10*3/uL (ref 150–400)
RBC: 4.3 MIL/uL (ref 4.22–5.81)
RDW: 12.3 % (ref 11.5–15.5)
WBC: 5.5 10*3/uL (ref 4.0–10.5)
nRBC: 0 % (ref 0.0–0.2)

## 2020-03-17 LAB — BASIC METABOLIC PANEL
Anion gap: 8 (ref 5–15)
BUN: 12 mg/dL (ref 8–23)
CO2: 29 mmol/L (ref 22–32)
Calcium: 8.9 mg/dL (ref 8.9–10.3)
Chloride: 101 mmol/L (ref 98–111)
Creatinine, Ser: 0.93 mg/dL (ref 0.61–1.24)
GFR, Estimated: >60 mL/min (ref >60)
Glucose, Bld: 99 mg/dL (ref 70–99)
Potassium: 4.6 mmol/L (ref 3.5–5.1)
Sodium: 138 mmol/L (ref 135–145)

## 2020-03-17 MED ORDER — HYDROCODONE-ACETAMINOPHEN 5-325 MG PO TABS: 1.0000 | ORAL_TABLET | ORAL | 0 refills | Status: DC | PRN

## 2020-03-17 MED ORDER — RIVAROXABAN (XARELTO) VTE STARTER PACK (15 & 20 MG): ORAL_TABLET | ORAL | 0 refills | Status: DC

## 2020-03-17 MED ORDER — DM-GUAIFENESIN ER 30-600 MG PO TB12: 1.0000 | ORAL_TABLET | Freq: Two times a day (BID) | ORAL | 0 refills | Status: DC | PRN

## 2020-03-17 MED ORDER — RIVAROXABAN 15 MG PO TABS
15.0000 mg | ORAL_TABLET | Freq: Two times a day (BID) | ORAL | Status: DC
  Administered 2020-03-17: 15 mg via ORAL
  Filled 2020-03-17: qty 1

## 2020-03-17 NOTE — Progress Notes (Signed)
Provided and discussed discharge instructions. Addressed all questions and concerns. Patient verbalized understanding. Removed IV, skin is intact and clean.  Philip [name]

## 2020-03-17 NOTE — Discharge Instructions (Signed)
Information on my medicine - XARELTO (rivaroxaban)  This medication education was reviewed with me or my healthcare representative as part of my discharge preparation.  The pharmacist that spoke with me during my hospital stay was:  Minda Ditto, [place]? Xarelto was prescribed to treat blood clots that may have been found in the veins of your legs (deep vein thrombosis) or in your lungs (pulmonary embolism) and to reduce the risk of them occurring again.  What do you need to know about Xarelto? The starting dose is one 15 mg tablet taken TWICE daily with food for the FIRST 21 DAYS then on (enter date)  04/07/2020  the dose is changed to one 20 mg tablet taken ONCE A DAY with your evening meal.  DO NOT stop taking Xarelto without talking to the health care provider who prescribed the medication.  Refill your prescription for 20 mg tablets before you run out.  After discharge, you should have regular check-up appointments with your healthcare provider that is prescribing your Xarelto.  In the future your dose may need to be changed if your kidney function changes by a significant amount.  What do you do if you miss a dose? If you are taking Xarelto TWICE DAILY and you miss a dose, take it as soon as you remember. You may take two 15 mg tablets (total 30 mg) at the same time then resume your regularly scheduled 15 mg twice daily the next day.  If you are taking Xarelto ONCE DAILY and you miss a dose, take it as soon as you remember on the same day then continue your regularly scheduled once daily regimen the next day. Do not take two doses of Xarelto at the same time.   Important Safety Information Xarelto is a blood thinner medicine that can cause bleeding. You should call your healthcare provider right away if you experience any of the following: ? Bleeding from an injury or your nose that does not stop. ? Unusual colored urine (red or dark brown) or  unusual colored stools (red or black). ? Unusual bruising for unknown reasons. ? A serious fall or if you hit your head (even if there is no bleeding).  Some medicines may interact with Xarelto and might increase your risk of bleeding while on Xarelto. To help avoid this, consult your healthcare provider or pharmacist prior to using any new prescription or non-prescription medications, including herbals, vitamins, non-steroidal anti-inflammatory drugs (NSAIDs) and supplements.  This website has more information on Xarelto: [email or web address]

## 2020-03-17 NOTE — Discharge Summary (Addendum)
Physician Discharge Summary  Philip [name] BDZ:[id number] DOB: [birth year] DOA: 03/14/2020  PCP: Philip Pepper, MD  Admit date: 03/14/2020 Discharge date: 03/17/2020  Time spent: 37 minutes  Recommendations for Outpatient Follow-up:  1. Requires reinitiation of Xarelto and therefore will need change of dose from 15 twice daily to 20 daily after 21 days of continued higher dosing for new PE 2. Requires CBC differential and Chem-12 in 1 week 3. Follow-up with Dr. Earlie [name] in the outpatient setting  Discharge Diagnoses:  Principal Problem:   Acute pulmonary embolism Philip [name]) Active Problems:   Adenocarcinoma of right lung, stage 3 (HCC)   History of pulmonary embolus (PE)   Discharge Condition: Improved  Diet recommendation: Heart healthy  Filed Weights   03/14/20 0939 03/14/20 1039  Weight: 85.7 kg 81.6 kg    History of present illness:  65 year old male chronic tinnitus Prior smoker COPD stage III-lung adeno CA diagnosed 01/22/2019 no XRT secondary to severe esophagitis finished 6 cycles of chemo/immunotherapy Pulmonary MAI infection right lower lobe Right lower lobe PE diagnosed 06/16/2019 Allegedly missed 3 days anticoagulation-also car drive 3 hours Philip [name]-developed DOE PND Came to emergency room CT chest showed acute on chronic RLL PE blocking right lower lobe pulmonary arterial tree desatted to 80s  Hospital Course:   1. Acute hypoxic respiratory failure 2. 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 Given hemodynamic stability hold echo b. Was transiently on heparin GTT for 24 hours and then transition back to Xarelto 15 twice daily and then will transition in the outpatient setting back to 20 mg c. Reinforced with him strict precautions to take blood thinner-probably will need lifelong given his cancer  3. COPD stage III on chronic oxygen a. Continue albuterol every 4 as needed, Stiolto Respimat 2 puffs daily and Allegra in addition to Spiriva 2 puffs  daily b. Ambulated c. -Encouraged to quit smoking 4. Adeno CA lung 01/2019 on immunotherapy a. Per oncologist in the outpatient setting 5. Prior MAI   Discharge Exam: Vitals:   03/16/20 2111 03/17/20 0459  BP: 99/69 96/64  Pulse: 74 74  Resp: 20 16  Temp: (!) 97.5 F (36.4 C) 97.8 F (36.6 C)  SpO2: 99% 98%    General: Awake coherent alert no distress EOMI NCAT no focal deficit Cardiovascular: S1-S2 no murmur slight tachycardia Respiratory: Clear no added sound no rales no rhonchi Abdomen soft no rebound no guarding Neurologically intact no focal deficit  Discharge Instructions   Discharge Instructions    Diet - low sodium heart healthy   Complete by: As directed    Discharge instructions   Complete by: As directed    Make sure that u take ur Xarelto at the dose of twice a day for 21 days and on day 22 change back to the 20 mg dosing and continue this Do not miss doses You can increased ur activity slowly over the next several weeks and do as you need to be outpatient setting I have given you a letter excusing you from work but if you feel up to it and are doing minimal to light activity I would recommend that for the next 7 to 10 days and then gradually increase your output and your exertion   Increase activity slowly   Complete by: As directed      Allergies as of 03/17/2020      Reactions   Norco [hydrocodone-acetaminophen] Other (See Comments)   Made pt feel jittery  Medication List    STOP taking these medications   oxyCODONE-acetaminophen 5-325 MG tablet Commonly known as: PERCOCET/ROXICET   rivaroxaban 20 MG Tabs tablet Commonly known as: XARELTO Replaced by: Rivaroxaban Stater Pack (15 mg and 20 mg)     TAKE these medications   albuterol 108 (90 Base) MCG/ACT inhaler Commonly known as: VENTOLIN HFA Inhale 2 puffs into the lungs every 4 (four) hours as needed for wheezing or shortness of breath.   atorvastatin 20 MG tablet Commonly known as:  LIPITOR Take 20 mg by mouth daily.   Compressor/Nebulizer Misc 1 application by Does not apply route every 4 (four) hours as needed.   dextromethorphan-guaiFENesin 30-600 MG 12hr tablet Commonly known as: MUCINEX DM Take 1 tablet by mouth 2 (two) times daily as needed for cough.   famotidine 20 MG tablet Commonly known as: PEPCID TAKE 1 TABLET BY MOUTH TWICE A DAY   fexofenadine 180 MG tablet Commonly known as: ALLEGRA Take 180 mg by mouth daily.   HYDROcodone-acetaminophen 5-325 MG tablet Commonly known as: NORCO/VICODIN Take 1 tablet by mouth every 4 (four) hours as needed for moderate pain.   Rivaroxaban Stater Pack (15 mg and 20 mg) Commonly known as: XARELTO STARTER PACK Follow package directions: Take one 15mg  tablet by mouth twice a day. On day 22, switch to one 20mg  tablet once a day. Take with food. Replaces: rivaroxaban 20 MG Tabs tablet   Spiriva Respimat 1.25 MCG/ACT Aers Generic drug: Tiotropium Bromide Monohydrate Inhale 2 puffs into the lungs daily.   Stiolto Respimat 2.5-2.5 MCG/ACT Aers Generic drug: Tiotropium Bromide-Olodaterol Inhale 2 puffs into the lungs daily.      Allergies  Allergen Reactions  . Norco [Hydrocodone-Acetaminophen] Other (See Comments)    Made pt feel jittery       The results of significant diagnostics from this hospitalization (including imaging, microbiology, ancillary and laboratory) are listed below for reference.    Significant Diagnostic Studies: DG Chest 2 View  Result Date: 03/14/2020 CLINICAL DATA:  Chest pain and shortness of breath EXAM: CHEST - 2 VIEW COMPARISON:  Jun 16, 2019 chest radiograph and January 24, 2020 chest CT FINDINGS: There is stable scarring in the right upper and right lower lobe regions as well as in the left perihilar region with retraction in the left perihilar region, stable. No edema or airspace opacity is evident. Heart size is normal. Distortion of pulmonary vascularity is stable. No overt  adenopathy is evident by radiography. There is aortic atherosclerosis. There is degenerative change in the thoracic spine. IMPRESSION: Areas of scarring as well as left perihilar retraction, stable. No appreciable new opacity. Stable cardiac silhouette. Aortic Atherosclerosis (ICD10-I70.0). Electronically Signed   By: Lowella Grip III M.D.   On: 03/14/2020 10:06   CT Angio Chest PE W and/or Wo Contrast  Result Date: 03/14/2020 CLINICAL DATA:  Pleuritic chest pain. History of pulmonary embolism with missed anticoagulation medication for 3 days. History of non-small cell right lung cancer status post chemotherapy and radiation therapy. EXAM: CT ANGIOGRAPHY CHEST WITH CONTRAST TECHNIQUE: Multidetector CT imaging of the chest was performed using the standard protocol during bolus administration of intravenous contrast. Multiplanar CT image reconstructions and MIPs were obtained to evaluate the vascular anatomy. CONTRAST:  13mL OMNIPAQUE IOHEXOL 350 MG/ML SOLN COMPARISON:  01/24/2020 chest CT. FINDINGS: Cardiovascular: The study is moderate quality for the evaluation of pulmonary embolism, with some motion degradation. Acute on chronic expansile pulmonary embolism in right lower lobe now nearly occluding the right lower  lobe pulmonary arterial tree to the lobar level (series 5/image 134). No additional sites of pulmonary embolism. Mild atherosclerotic nonaneurysmal thoracic aorta. Top-normal caliber main pulmonary artery (3.3 cm diameter), stable. Normal heart size. No significant pericardial fluid/thickening. RV/LV ratio 1.0, slightly elevated. Mediastinum/Nodes: No discrete thyroid nodules. Unremarkable esophagus. No axillary adenopathy. No pathologically enlarged mediastinal or hilar nodes. Lungs/Pleura: No pneumothorax. Trace dependent right pleural effusion, not appreciably changed. No left pleural effusion. Moderate centrilobular and paraseptal emphysema with mild diffuse bronchial wall thickening and  saber sheath trachea. Right perihilar masslike fibrosis measures 5.8 x 3.3 cm (series 6/image 53), previously 5.8 x 3.7 cm, not appreciably changed. Dense consolidation, volume loss, bronchiectasis and distortion in the medial right lower lobe is not substantially changed, compatible with a combination of radiation fibrosis and pre-existing postinfectious scarring. No acute interval consolidative airspace disease or new significant pulmonary nodules. Upper abdomen: Stable 2.1 cm left adrenal adenoma with density -5 HU. Stable 1.4 cm right adrenal adenoma with density 8 HU. Musculoskeletal: No aggressive appearing focal osseous lesions. Moderate thoracic spondylosis. Review of the MIP images confirms the above findings. IMPRESSION: 1. Acute on chronic expansile right lower lobe pulmonary embolism now nearly occluding the right lower lobe pulmonary arterial tree to the lobar level. No additional sites of pulmonary embolism. 2. Positive for acute PE with CTevidence of right heart strain (RV/LV Ratio = 1.0) consistent with at least submassive (intermediate risk) PE. The presence of right heart strain has been associated with an increased risk of morbidity and mortality. 3. Stable post-treatment changes in the right hemithorax. No findings of metastatic disease in the chest. 4. Stable trace dependent right pleural effusion. 5. Stable bilateral adrenal adenomas. 6. Aortic Atherosclerosis (ICD10-I70.0) and Emphysema (ICD10-J43.9). Critical Value/emergent results were called by telephone at the time of interpretation on 03/14/2020 at 12:06 pm to provider CLAUDIA GIBBONS , who verbally acknowledged these results. Electronically Signed   By: Ilona Sorrel M.D.   On: 03/14/2020 12:08    Microbiology: Recent Results (from the past 240 hour(s))  Resp Panel by RT-PCR (Flu A&B, Covid) Nasopharyngeal Swab     Status: None   Collection Time: 03/14/20 10:38 AM   Specimen: Nasopharyngeal Swab; Nasopharyngeal(NP) swabs in vial  transport medium  Result Value Ref Range Status   SARS Coronavirus 2 by RT PCR NEGATIVE NEGATIVE Final    Comment: (NOTE) SARS-CoV-2 target nucleic acids are NOT DETECTED.  The SARS-CoV-2 RNA is generally detectable in upper respiratory specimens during the acute phase of infection. The lowest concentration of SARS-CoV-2 viral copies this assay can detect is 138 copies/mL. A negative result does not preclude SARS-Cov-2 infection and should not be used as the sole basis for treatment or other patient management decisions. A negative result may occur with  improper specimen collection/handling, submission of specimen other than nasopharyngeal swab, presence of viral mutation(s) within the areas targeted by this assay, and inadequate number of viral copies(<138 copies/mL). A negative result must be combined with clinical observations, patient history, and epidemiological information. The expected result is Negative.  Fact Sheet for Patients:  EntrepreneurPulse.com.au  Fact Sheet for Healthcare Providers:  IncredibleEmployment.be  This test is no t yet approved or cleared by the [place] FDA and  has been authorized for detection and/or diagnosis of SARS-CoV-2 by FDA under an Emergency Use Authorization (EUA). This EUA will remain  in effect (meaning this test can be used) for the duration of the COVID-19 declaration under Section 564(b)(1) of the Act, 21 U.S.C.section 360bbb-3(b)(1),  unless the authorization is terminated  or revoked sooner.       Influenza A by PCR NEGATIVE NEGATIVE Final   Influenza B by PCR NEGATIVE NEGATIVE Final    Comment: (NOTE) The Xpert Xpress SARS-CoV-2/FLU/RSV plus assay is intended as an aid in the diagnosis of influenza from Nasopharyngeal swab specimens and should not be used as a sole basis for treatment. Nasal washings and aspirates are unacceptable for Xpert Xpress SARS-CoV-2/FLU/RSV testing.  Fact  Sheet for Patients: EntrepreneurPulse.com.au  Fact Sheet for Healthcare Providers: IncredibleEmployment.be  This test is not yet approved or cleared by the [place] FDA and has been authorized for detection and/or diagnosis of SARS-CoV-2 by FDA under an Emergency Use Authorization (EUA). This EUA will remain in effect (meaning this test can be used) for the duration of the COVID-19 declaration under Section 564(b)(1) of the Act, 21 U.S.C. section 360bbb-3(b)(1), unless the authorization is terminated or revoked.  Performed at [facility], [facility] [address] [place], [place] [address]      Labs: Basic Metabolic Panel: Recent Labs  Lab 03/14/20 1038 03/15/20 0500 03/16/20 0614 03/17/20 0544  NA 133* 135 134* 138  K 4.1 4.6 4.1 4.6  CL 101 101 100 101  CO2 22 27 26 29   GLUCOSE 108* 101* 104* 99  BUN 15 16 16 12   CREATININE 0.86 0.88 0.89 0.93  CALCIUM 8.7* 8.3* 8.5* 8.9   Liver Function Tests: No results for input(s): AST, ALT, ALKPHOS, BILITOT, PROT, ALBUMIN in the last 168 hours. No results for input(s): LIPASE, AMYLASE in the last 168 hours. No results for input(s): AMMONIA in the last 168 hours. CBC: Recent Labs  Lab 03/14/20 1038 03/15/20 0500 03/16/20 0614 03/17/20 0544  WBC 10.0 7.7 6.4 5.5  HGB 14.7 13.2 13.6 13.7  HCT 42.9 40.1 39.8 40.3  MCV 92.9 95.0 93.6 93.7  PLT 176 160 168 197   Cardiac Enzymes: No results for input(s): CKTOTAL, CKMB, CKMBINDEX, TROPONINI in the last 168 hours. BNP: BNP (last 3 results) Recent Labs    06/16/19 1517  BNP 63.5    ProBNP (last 3 results) No results for input(s): PROBNP in the last 8760 hours.  CBG: No results for input(s): GLUCAP in the last 168 hours.     Signed:  [name] MD   [facility] 03/17/2020, 9:02 AM

## 2020-03-21 ENCOUNTER — Inpatient Hospital Stay (HOSPITAL_BASED_OUTPATIENT_CLINIC_OR_DEPARTMENT_OTHER): Payer: No Typology Code available for payment source | Admitting: Internal Medicine

## 2020-03-21 ENCOUNTER — Other Ambulatory Visit: Payer: Self-pay

## 2020-03-21 ENCOUNTER — Inpatient Hospital Stay: Payer: No Typology Code available for payment source | Attending: Internal Medicine

## 2020-03-21 ENCOUNTER — Inpatient Hospital Stay: Payer: No Typology Code available for payment source

## 2020-03-21 VITALS — BP 119/81 | HR 81 | Temp 97.3°F | Resp 16 | Ht 66.5 in | Wt 187.2 lb

## 2020-03-21 DIAGNOSIS — C3491 Malignant neoplasm of unspecified part of right bronchus or lung: Secondary | ICD-10-CM

## 2020-03-21 DIAGNOSIS — C3411 Malignant neoplasm of upper lobe, right bronchus or lung: Secondary | ICD-10-CM | POA: Insufficient documentation

## 2020-03-21 DIAGNOSIS — M47814 Spondylosis without myelopathy or radiculopathy, thoracic region: Secondary | ICD-10-CM | POA: Insufficient documentation

## 2020-03-21 DIAGNOSIS — Z86718 Personal history of other venous thrombosis and embolism: Secondary | ICD-10-CM | POA: Diagnosis not present

## 2020-03-21 DIAGNOSIS — Z86711 Personal history of pulmonary embolism: Secondary | ICD-10-CM | POA: Insufficient documentation

## 2020-03-21 DIAGNOSIS — I2690 Septic pulmonary embolism without acute cor pulmonale: Secondary | ICD-10-CM | POA: Diagnosis not present

## 2020-03-21 DIAGNOSIS — Z79899 Other long term (current) drug therapy: Secondary | ICD-10-CM | POA: Insufficient documentation

## 2020-03-21 DIAGNOSIS — Z5112 Encounter for antineoplastic immunotherapy: Secondary | ICD-10-CM

## 2020-03-21 DIAGNOSIS — I2699 Other pulmonary embolism without acute cor pulmonale: Secondary | ICD-10-CM | POA: Insufficient documentation

## 2020-03-21 DIAGNOSIS — C778 Secondary and unspecified malignant neoplasm of lymph nodes of multiple regions: Secondary | ICD-10-CM | POA: Diagnosis not present

## 2020-03-21 DIAGNOSIS — E785 Hyperlipidemia, unspecified: Secondary | ICD-10-CM | POA: Diagnosis not present

## 2020-03-21 DIAGNOSIS — D3502 Benign neoplasm of left adrenal gland: Secondary | ICD-10-CM | POA: Insufficient documentation

## 2020-03-21 DIAGNOSIS — J439 Emphysema, unspecified: Secondary | ICD-10-CM | POA: Insufficient documentation

## 2020-03-21 DIAGNOSIS — Z7901 Long term (current) use of anticoagulants: Secondary | ICD-10-CM | POA: Diagnosis not present

## 2020-03-21 DIAGNOSIS — Z5111 Encounter for antineoplastic chemotherapy: Secondary | ICD-10-CM | POA: Diagnosis not present

## 2020-03-21 DIAGNOSIS — I700 Atherosclerosis of aorta: Secondary | ICD-10-CM | POA: Insufficient documentation

## 2020-03-21 DIAGNOSIS — F17210 Nicotine dependence, cigarettes, uncomplicated: Secondary | ICD-10-CM | POA: Insufficient documentation

## 2020-03-21 DIAGNOSIS — D3501 Benign neoplasm of right adrenal gland: Secondary | ICD-10-CM | POA: Diagnosis not present

## 2020-03-21 LAB — CBC WITH DIFFERENTIAL (CANCER CENTER ONLY)
Abs Immature Granulocytes: 0.04 10*3/uL (ref 0.00–0.07)
Basophils Absolute: 0.1 10*3/uL (ref 0.0–0.1)
Basophils Relative: 1 %
Eosinophils Absolute: 0.1 10*3/uL (ref 0.0–0.5)
Eosinophils Relative: 2 %
HCT: 41.6 % (ref 39.0–52.0)
Hemoglobin: 13.8 g/dL (ref 13.0–17.0)
Immature Granulocytes: 1 %
Lymphocytes Relative: 17 %
Lymphs Abs: 1.1 10*3/uL (ref 0.7–4.0)
MCH: 31.7 pg (ref 26.0–34.0)
MCHC: 33.2 g/dL (ref 30.0–36.0)
MCV: 95.6 fL (ref 80.0–100.0)
Monocytes Absolute: 0.7 10*3/uL (ref 0.1–1.0)
Monocytes Relative: 11 %
Neutro Abs: 4.7 10*3/uL (ref 1.7–7.7)
Neutrophils Relative %: 68 %
Platelet Count: 265 10*3/uL (ref 150–400)
RBC: 4.35 MIL/uL (ref 4.22–5.81)
RDW: 12.6 % (ref 11.5–15.5)
WBC Count: 6.7 10*3/uL (ref 4.0–10.5)
nRBC: 0 % (ref 0.0–0.2)

## 2020-03-21 LAB — CMP (CANCER CENTER ONLY)
ALT: 65 U/L — ABNORMAL HIGH (ref 0–44)
AST: 30 U/L (ref 15–41)
Albumin: 3.2 g/dL — ABNORMAL LOW (ref 3.5–5.0)
Alkaline Phosphatase: 115 U/L (ref 38–126)
Anion gap: 8 (ref 5–15)
BUN: 21 mg/dL (ref 8–23)
CO2: 24 mmol/L (ref 22–32)
Calcium: 8.8 mg/dL — ABNORMAL LOW (ref 8.9–10.3)
Chloride: 108 mmol/L (ref 98–111)
Creatinine: 0.94 mg/dL (ref 0.61–1.24)
GFR, Estimated: >60 mL/min (ref >60)
Glucose, Bld: 78 mg/dL (ref 70–99)
Potassium: 3.8 mmol/L (ref 3.5–5.1)
Sodium: 140 mmol/L (ref 135–145)
Total Bilirubin: 0.3 mg/dL (ref 0.3–1.2)
Total Protein: 6.8 g/dL (ref 6.5–8.1)

## 2020-03-21 LAB — TSH: TSH: 2.158 u[IU]/mL (ref 0.320–4.118)

## 2020-03-21 MED ORDER — SODIUM CHLORIDE 0.9 % IV SOLN
1500.0000 mg | Freq: Once | INTRAVENOUS | Status: AC
Start: 2020-03-21 — End: 2020-03-21
  Administered 2020-03-21: 1500 mg via INTRAVENOUS
  Filled 2020-03-21: qty 30

## 2020-03-21 MED ORDER — SODIUM CHLORIDE 0.9 % IV SOLN
Freq: Once | INTRAVENOUS | Status: AC
  Filled 2020-03-21: qty 250

## 2020-03-21 NOTE — Progress Notes (Signed)
[place] [phone]   [phone]  OFFICE PROGRESS NOTE  [name], MD [address] [place] [address]  DIAGNOSIS:  1) Stage IIIc (T4,N3, M0)non-small cell lung cancer, adenocarcinoma presented with large right hilar mass in addition to bilateral hilar and mediastinal lymphadenopathy diagnosed in December 2020. 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  PRIOR THERAPY: Weekly concurrent chemoradiation with carboplatin for an AUC of 2 and paclitaxel 45 mg/m2. First dose starting 01/30/2019.   Status post 6 cycles.  CURRENT THERAPY:  1) Consolidation immunotherapy with Imfinzi 1500 mg IV every 4 weeks. First dose expected on 04/19/2019.  Status post 11 cycles. 2) Xarelto 15 mg p.o. twice daily for 3 weeks followed by 20 mg p.o. daily.  First dose was given on March 17 2020.  INTERVAL HISTORY: Philip [name] 65 y.o. male returns to the clinic today for follow-up visit.  The patient is feeling fine today with no concerning complaints except for fatigue.  He was on treatment with Xarelto but he missed it for few days.  The patient started having more shortness of breath and stabbing pain on the right side of the chest.  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  He resumed his treatment with Xarelto with a starter kit.  He is feeling much better now.  He denied having any chest pain but has shortness of breath with exertion with no cough or hemoptysis.  He denied having any fever or chills.  He has no nausea, vomiting, diarrhea or constipation.  He has no headache or visual changes.  He is here today for evaluation before starting cycle #12 of his treatment.   MEDICAL  HISTORY: Past Medical History:  Diagnosis Date  . Hyperlipidemia   . lung ca dx'd 12/2018  . Pneumonia   . Pulmonary embolus ([place])   . Tobacco abuse     ALLERGIES:  is allergic to norco [hydrocodone-acetaminophen].  MEDICATIONS:  Current Outpatient Medications  Medication Sig Dispense Refill  . albuterol (VENTOLIN HFA) 108 (90 Base) MCG/ACT inhaler Inhale 2 puffs into the lungs every 4 (four) hours as needed for wheezing or shortness of breath. 18 g 6  . atorvastatin (LIPITOR) 20 MG tablet Take 20 mg by mouth daily.    Marland Kitchen dextromethorphan-guaiFENesin (MUCINEX DM) 30-600 MG 12hr tablet Take 1 tablet by mouth 2 (two) times daily as needed for cough. 40 tablet 0  . famotidine (PEPCID) 20 MG tablet TAKE 1 TABLET BY MOUTH TWICE A DAY (Patient taking differently: Take 20 mg by mouth 2 (two) times daily.) 180 tablet 1  . fexofenadine (ALLEGRA) 180 MG tablet Take 180 mg by mouth daily.    Marland Kitchen HYDROcodone-acetaminophen (NORCO/VICODIN) 5-325 MG tablet Take 1 tablet by mouth every 4 (four) hours as needed for moderate pain. 20 tablet 0  . Nebulizers (COMPRESSOR/NEBULIZER) MISC 1 application by Does not apply route every 4 (four) hours as needed. 1 each 0  . RIVAROXABAN (XARELTO) VTE STARTER PACK (15 & 20 MG) Follow package directions: Take one 28m tablet by mouth twice a day. On day 22, switch to one 27mtablet once a day. Take with food. 51 each 0  . SPIRIVA RESPIMAT 1.25 MCG/ACT AERS Inhale  2 puffs into the lungs daily.    . Tiotropium Bromide-Olodaterol (STIOLTO RESPIMAT) 2.5-2.5 MCG/ACT AERS Inhale 2 puffs into the lungs daily. (Patient not taking: Reported on 03/14/2020) 3 each 3   No current facility-administered medications for this visit.    SURGICAL HISTORY:  Past Surgical History:  Procedure Laterality Date  . COLONOSCOPY    . HAND SURGERY    . VIDEO BRONCHOSCOPY WITH ENDOBRONCHIAL ULTRASOUND N/A 12/22/2018   Procedure: VIDEO BRONCHOSCOPY WITH ENDOBRONCHIAL ULTRASOUND;  Surgeon:  Icard, Bradley L, DO;  Location: [facility] OR;  Service: Thoracic;  Laterality: N/A;  . VIDEO BRONCHOSCOPY WITH ENDOBRONCHIAL ULTRASOUND N/A 01/09/2019   Procedure: VIDEO BRONCHOSCOPY WITH ENDOBRONCHIAL ULTRASOUND WITH FLUORO;  Surgeon: Icard, Bradley L, DO;  Location: [facility] OR;  Service: Thoracic;  Laterality: N/A;  . VIDEO BRONCHOSCOPY WITH RADIAL ENDOBRONCHIAL ULTRASOUND N/A 12/22/2018   Procedure: RADIAL ENDOBRONCHIAL ULTRASOUND;  Surgeon: Icard, Bradley L, DO;  Location: [facility] OR;  Service: Thoracic;  Laterality: N/A;    REVIEW OF SYSTEMS:  A comprehensive review of systems was negative except for: Constitutional: positive for fatigue Respiratory: positive for dyspnea on exertion   PHYSICAL EXAMINATION: General appearance: alert, cooperative and no distress Head: Normocephalic, without obvious abnormality, atraumatic Neck: no adenopathy, no JVD, supple, symmetrical, trachea midline and thyroid not enlarged, symmetric, no tenderness/mass/nodules Lymph nodes: Cervical, supraclavicular, and axillary nodes normal. Resp: clear to auscultation bilaterally Back: symmetric, no curvature. ROM normal. No CVA tenderness. Cardio: regular rate and rhythm, S1, S2 normal, no murmur, click, rub or gallop GI: soft, non-tender; bowel sounds normal; no masses,  no organomegaly Extremities: extremities normal, atraumatic, no cyanosis or edema  ECOG PERFORMANCE STATUS: 1 - Symptomatic but completely ambulatory  Blood pressure 119/81, pulse 81, temperature (!) 97.3 F (36.3 C), temperature source Tympanic, resp. rate 16, height 5' 6.5" (1.689 m), weight 187 lb 3.2 oz (84.9 kg), SpO2 100 %.  LABORATORY DATA: Lab Results  Component Value Date   WBC 6.7 03/21/2020   HGB 13.8 03/21/2020   HCT 41.6 03/21/2020   MCV 95.6 03/21/2020   PLT 265 03/21/2020      Chemistry      Component Value Date/Time   NA 138 03/17/2020 0544   K 4.6 03/17/2020 0544   CL 101 03/17/2020 0544   CO2 29 03/17/2020 0544   BUN 12  03/17/2020 0544   CREATININE 0.93 03/17/2020 0544   CREATININE 0.96 02/22/2020 0802      Component Value Date/Time   CALCIUM 8.9 03/17/2020 0544   ALKPHOS 94 02/22/2020 0802   AST 20 02/22/2020 0802   ALT 27 02/22/2020 0802   BILITOT 0.4 02/22/2020 0802       RADIOGRAPHIC STUDIES: DG Chest 2 View  Result Date: 03/14/2020 CLINICAL DATA:  Chest pain and shortness of breath EXAM: CHEST - 2 VIEW COMPARISON:  Jun 16, 2019 chest radiograph and January 24, 2020 chest CT FINDINGS: There is stable scarring in the right upper and right lower lobe regions as well as in the left perihilar region with retraction in the left perihilar region, stable. No edema or airspace opacity is evident. Heart size is normal. Distortion of pulmonary vascularity is stable. No overt adenopathy is evident by radiography. There is aortic atherosclerosis. There is degenerative change in the thoracic spine. IMPRESSION: Areas of scarring as well as left perihilar retraction, stable. No appreciable new opacity. Stable cardiac silhouette. Aortic Atherosclerosis (ICD10-I70.0). Electronically Signed   By: William  Woodruff III M.D.   On: 03/14/2020 10:06     CT Angio Chest PE W and/or Wo Contrast  Result Date: 03/14/2020 CLINICAL DATA:  Pleuritic chest pain. History of pulmonary embolism with missed anticoagulation medication for 3 days. History of non-small cell right lung cancer status post chemotherapy and radiation therapy. EXAM: CT ANGIOGRAPHY CHEST WITH CONTRAST TECHNIQUE: Multidetector CT imaging of the chest was performed using the standard protocol during bolus administration of intravenous contrast. Multiplanar CT image reconstructions and MIPs were obtained to evaluate the vascular anatomy. CONTRAST:  18m OMNIPAQUE IOHEXOL 350 MG/ML SOLN COMPARISON:  01/24/2020 chest CT. FINDINGS: Cardiovascular: The study is moderate quality for the evaluation of pulmonary embolism, with some motion degradation. Acute on chronic expansile  pulmonary embolism in right lower lobe now nearly occluding the right lower lobe pulmonary arterial tree to the lobar level (series 5/image 134). No additional sites of pulmonary embolism. Mild atherosclerotic nonaneurysmal thoracic aorta. Top-normal caliber main pulmonary artery (3.3 cm diameter), stable. Normal heart size. No significant pericardial fluid/thickening. RV/LV ratio 1.0, slightly elevated. Mediastinum/Nodes: No discrete thyroid nodules. Unremarkable esophagus. No axillary adenopathy. No pathologically enlarged mediastinal or hilar nodes. Lungs/Pleura: No pneumothorax. Trace dependent right pleural effusion, not appreciably changed. No left pleural effusion. Moderate centrilobular and paraseptal emphysema with mild diffuse bronchial wall thickening and saber sheath trachea. Right perihilar masslike fibrosis measures 5.8 x 3.3 cm (series 6/image 53), previously 5.8 x 3.7 cm, not appreciably changed. Dense consolidation, volume loss, bronchiectasis and distortion in the medial right lower lobe is not substantially changed, compatible with a combination of radiation fibrosis and pre-existing postinfectious scarring. No acute interval consolidative airspace disease or new significant pulmonary nodules. Upper abdomen: Stable 2.1 cm left adrenal adenoma with density -5 HU. Stable 1.4 cm right adrenal adenoma with density 8 HU. Musculoskeletal: No aggressive appearing focal osseous lesions. Moderate thoracic spondylosis. Review of the MIP images confirms the above findings. IMPRESSION: 1. Acute on chronic expansile right lower lobe pulmonary embolism now nearly occluding the right lower lobe pulmonary arterial tree to the lobar level. No additional sites of pulmonary embolism. 2. Positive for acute PE with CTevidence of right heart strain (RV/LV Ratio = 1.0) consistent with at least submassive (intermediate risk) PE. The presence of right heart strain has been associated with an increased risk of morbidity  and mortality. 3. Stable post-treatment changes in the right hemithorax. No findings of metastatic disease in the chest. 4. Stable trace dependent right pleural effusion. 5. Stable bilateral adrenal adenomas. 6. Aortic Atherosclerosis (ICD10-I70.0) and Emphysema (ICD10-J43.9). Critical Value/emergent results were called by telephone at the time of interpretation on 03/14/2020 at 12:06 pm to provider CLAUDIA GIBBONS , who verbally acknowledged these results. Electronically Signed   By: JIlona SorrelM.D.   On: 03/14/2020 12:08    ASSESSMENT AND PLAN: This is a very pleasant 65years old white male with a stage IIIc non-small cell lung cancer, adenocarcinoma diagnosed in December 2020 The patient completed a course of concurrent chemoradiation with weekly carboplatin and paclitaxel status post 6 cycles and he tolerated his treatment well and has partial response. He is currently undergoing consolidation treatment with immunotherapy with Imfinzi 1500 mg IV every 4 weeks status post 11 cycles. The patient has been tolerating this treatment well with no concerning complaints. I recommended for him to proceed with cycle #12 today as planned. For the pulmonary embolism, he will continue his current treatment with Xarelto. I will see the patient back for follow-up visit in 4 weeks for evaluation before starting cycle #13 in the last cycle of  his treatment. He was advised to call immediately if he has any other concerning symptoms in the interval. The patient voices understanding of current disease status and treatment options and is in agreement with the current care plan. All questions were answered. The patient knows to call the clinic with any problems, questions or concerns. We can certainly see the patient much sooner if necessary.  Disclaimer: This note was dictated with voice recognition software. Similar sounding words can inadvertently be transcribed and may not be corrected upon review.       

## 2020-03-21 NOTE — Patient Instructions (Addendum)
[facility] Discharge Instructions for Patients Receiving Chemotherapy  Today you received the following chemotherapy agents Durvalumab (IMFINZI).  To help prevent nausea and vomiting after your treatment, we encourage you to take your nausea medication as directed.   If you develop nausea and vomiting that is not controlled by your nausea medication, call the clinic.   BELOW ARE SYMPTOMS THAT SHOULD BE REPORTED IMMEDIATELY:  *FEVER GREATER THAN 100.5 F  *CHILLS WITH OR WITHOUT FEVER  NAUSEA AND VOMITING THAT IS NOT CONTROLLED WITH YOUR NAUSEA MEDICATION  *UNUSUAL SHORTNESS OF BREATH  *UNUSUAL BRUISING OR BLEEDING  TENDERNESS IN MOUTH AND THROAT WITH OR WITHOUT PRESENCE OF ULCERS  *URINARY PROBLEMS  *BOWEL PROBLEMS  UNUSUAL RASH Items with * indicate a potential emergency and should be followed up as soon as possible.  Feel free to call the clinic should you have any questions or concerns. The clinic phone number is [phone].  Please show the [facility] at check-in to the Emergency Department and triage nurse.

## 2020-03-25 ENCOUNTER — Telehealth: Payer: Self-pay | Admitting: Internal Medicine

## 2020-03-25 NOTE — Telephone Encounter (Signed)
Appts requested in 2/17 los were already on pt's scheduled from previous LOS. Made no changes to pt's schedule.

## 2020-03-28 ENCOUNTER — Telehealth: Payer: Self-pay | Admitting: *Deleted

## 2020-03-28 NOTE — Telephone Encounter (Signed)
I received a call from Philip [name] stating he had blurry vision today and he wanted to let us know.  Dr. Julien Nordmann states he can see him today. I updated patient and he does not want to be seen he just wanted to update. I asked that he drink more fluids and to call us back if he got more neuro symptoms and I gave examples.  He verbalized understanding.

## 2020-04-16 ENCOUNTER — Other Ambulatory Visit: Payer: Self-pay | Admitting: Physician Assistant

## 2020-04-16 DIAGNOSIS — C3491 Malignant neoplasm of unspecified part of right bronchus or lung: Secondary | ICD-10-CM

## 2020-04-18 ENCOUNTER — Other Ambulatory Visit: Payer: Self-pay

## 2020-04-18 ENCOUNTER — Inpatient Hospital Stay: Payer: No Typology Code available for payment source | Attending: Internal Medicine

## 2020-04-18 ENCOUNTER — Inpatient Hospital Stay: Payer: No Typology Code available for payment source

## 2020-04-18 ENCOUNTER — Inpatient Hospital Stay (HOSPITAL_BASED_OUTPATIENT_CLINIC_OR_DEPARTMENT_OTHER): Payer: No Typology Code available for payment source | Admitting: Internal Medicine

## 2020-04-18 ENCOUNTER — Encounter: Payer: Self-pay | Admitting: Internal Medicine

## 2020-04-18 VITALS — BP 93/63 | HR 74 | Temp 97.0°F | Resp 15 | Ht 66.5 in | Wt 191.7 lb

## 2020-04-18 DIAGNOSIS — C3491 Malignant neoplasm of unspecified part of right bronchus or lung: Secondary | ICD-10-CM | POA: Diagnosis not present

## 2020-04-18 DIAGNOSIS — C3490 Malignant neoplasm of unspecified part of unspecified bronchus or lung: Secondary | ICD-10-CM

## 2020-04-18 DIAGNOSIS — Z79899 Other long term (current) drug therapy: Secondary | ICD-10-CM | POA: Insufficient documentation

## 2020-04-18 DIAGNOSIS — Z86718 Personal history of other venous thrombosis and embolism: Secondary | ICD-10-CM | POA: Insufficient documentation

## 2020-04-18 DIAGNOSIS — Z7901 Long term (current) use of anticoagulants: Secondary | ICD-10-CM | POA: Diagnosis not present

## 2020-04-18 DIAGNOSIS — Z5112 Encounter for antineoplastic immunotherapy: Secondary | ICD-10-CM | POA: Diagnosis not present

## 2020-04-18 DIAGNOSIS — C3401 Malignant neoplasm of right main bronchus: Secondary | ICD-10-CM | POA: Insufficient documentation

## 2020-04-18 DIAGNOSIS — E785 Hyperlipidemia, unspecified: Secondary | ICD-10-CM | POA: Diagnosis not present

## 2020-04-18 DIAGNOSIS — Z923 Personal history of irradiation: Secondary | ICD-10-CM | POA: Insufficient documentation

## 2020-04-18 LAB — CMP (CANCER CENTER ONLY)
ALT: 25 U/L (ref 0–44)
AST: 17 U/L (ref 15–41)
Albumin: 3.6 g/dL (ref 3.5–5.0)
Alkaline Phosphatase: 78 U/L (ref 38–126)
Anion gap: 7 (ref 5–15)
BUN: 15 mg/dL (ref 8–23)
CO2: 23 mmol/L (ref 22–32)
Calcium: 8.9 mg/dL (ref 8.9–10.3)
Chloride: 109 mmol/L (ref 98–111)
Creatinine: 0.88 mg/dL (ref 0.61–1.24)
GFR, Estimated: >60 mL/min (ref >60)
Glucose, Bld: 91 mg/dL (ref 70–99)
Potassium: 4.3 mmol/L (ref 3.5–5.1)
Sodium: 139 mmol/L (ref 135–145)
Total Bilirubin: 0.5 mg/dL (ref 0.3–1.2)
Total Protein: 6.8 g/dL (ref 6.5–8.1)

## 2020-04-18 LAB — CBC WITH DIFFERENTIAL (CANCER CENTER ONLY)
Abs Immature Granulocytes: 0.02 10*3/uL (ref 0.00–0.07)
Basophils Absolute: 0 10*3/uL (ref 0.0–0.1)
Basophils Relative: 1 %
Eosinophils Absolute: 0.2 10*3/uL (ref 0.0–0.5)
Eosinophils Relative: 4 %
HCT: 42.9 % (ref 39.0–52.0)
Hemoglobin: 14.6 g/dL (ref 13.0–17.0)
Immature Granulocytes: 0 %
Lymphocytes Relative: 17 %
Lymphs Abs: 0.9 10*3/uL (ref 0.7–4.0)
MCH: 31.2 pg (ref 26.0–34.0)
MCHC: 34 g/dL (ref 30.0–36.0)
MCV: 91.7 fL (ref 80.0–100.0)
Monocytes Absolute: 0.6 10*3/uL (ref 0.1–1.0)
Monocytes Relative: 11 %
Neutro Abs: 3.5 10*3/uL (ref 1.7–7.7)
Neutrophils Relative %: 67 %
Platelet Count: 210 10*3/uL (ref 150–400)
RBC: 4.68 MIL/uL (ref 4.22–5.81)
RDW: 13 % (ref 11.5–15.5)
WBC Count: 5.2 10*3/uL (ref 4.0–10.5)
nRBC: 0 % (ref 0.0–0.2)

## 2020-04-18 LAB — TSH: TSH: 1.608 u[IU]/mL (ref 0.320–4.118)

## 2020-04-18 MED ORDER — SODIUM CHLORIDE 0.9 % IV SOLN
Freq: Once | INTRAVENOUS | Status: AC
  Filled 2020-04-18: qty 250

## 2020-04-18 MED ORDER — SODIUM CHLORIDE 0.9 % IV SOLN
1500.0000 mg | Freq: Once | INTRAVENOUS | Status: AC
  Administered 2020-04-18: 1500 mg via INTRAVENOUS
  Filled 2020-04-18: qty 30

## 2020-04-18 NOTE — Patient Instructions (Signed)
Steps to Quit Smoking Smoking tobacco is the leading cause of preventable death. It can affect almost every organ in the body. Smoking puts you and people around you at risk for many serious, long-lasting (chronic) diseases. Quitting smoking can be hard, but it is one of the best things that you can do for your health. It is never too late to quit. How do I get ready to quit? When you decide to quit smoking, make a plan to help you succeed. Before you quit:  Pick a date to quit. Set a date within the next 2 weeks to give you time to prepare.  Write down the reasons why you are quitting. Keep this list in places where you will see it often.  Tell your family, friends, and co-workers that you are quitting. Their support is important.  Talk with your doctor about the choices that may help you quit.  Find out if your health insurance will pay for these treatments.  Know the people, places, things, and activities that make you want to smoke (triggers). Avoid them. What first steps can I take to quit smoking?  Throw away all cigarettes at home, at work, and in your car.  Throw away the things that you use when you smoke, such as ashtrays and lighters.  Clean your car. Make sure to empty the ashtray.  Clean your home, including curtains and carpets. What can I do to help me quit smoking? Talk with your doctor about taking medicines and seeing a counselor at the same time. You are more likely to succeed when you do both.  If you are pregnant or breastfeeding, talk with your doctor about counseling or other ways to quit smoking. Do not take medicine to help you quit smoking unless your doctor tells you to do so. To quit smoking: Quit right away  Quit smoking totally, instead of slowly cutting back on how much you smoke over a period of time.  Go to counseling. You are more likely to quit if you go to counseling sessions regularly. Take medicine You may take medicines to help you quit. Some  medicines need a prescription, and some you can buy over-the-counter. Some medicines may contain a drug called nicotine to replace the nicotine in cigarettes. Medicines may:  Help you to stop having the desire to smoke (cravings).  Help to stop the problems that come when you stop smoking (withdrawal symptoms). Your doctor may ask you to use:  Nicotine patches, gum, or lozenges.  Nicotine inhalers or sprays.  Non-nicotine medicine that is taken by mouth. Find resources Find resources and other ways to help you quit smoking and remain smoke-free after you quit. These resources are most helpful when you use them often. They include:  Online chats with a counselor.  Phone quitlines.  Printed self-help materials.  Support groups or group counseling.  Text messaging programs.  Mobile phone apps. Use apps on your mobile phone or tablet that can help you stick to your quit plan. There are many free apps for mobile phones and tablets as well as websites. Examples include Quit Guide from the CDC and smokefree.gov   What things can I do to make it easier to quit?  Talk to your family and friends. Ask them to support and encourage you.  Call a phone quitline (1-800-QUIT-NOW), reach out to support groups, or work with a counselor.  Ask people who smoke to not smoke around you.  Avoid places that make you want to smoke,   such as: ? Bars. ? Parties. ? Smoke-break areas at work.  Spend time with people who do not smoke.  Lower the stress in your life. Stress can make you want to smoke. Try these things to help your stress: ? Getting regular exercise. ? Doing deep-breathing exercises. ? Doing yoga. ? Meditating. ? Doing a body scan. To do this, close your eyes, focus on one area of your body at a time from head to toe. Notice which parts of your body are tense. Try to relax the muscles in those areas.   How will I feel when I quit smoking? Day 1 to 3 weeks Within the first 24 hours,  you may start to have some problems that come from quitting tobacco. These problems are very bad 2-3 days after you quit, but they do not often last for more than 2-3 weeks. You may get these symptoms:  Mood swings.  Feeling restless, nervous, angry, or annoyed.  Trouble concentrating.  Dizziness.  Strong desire for high-sugar foods and nicotine.  Weight gain.  Trouble pooping (constipation).  Feeling like you may vomit (nausea).  Coughing or a sore throat.  Changes in how the medicines that you take for other issues work in your body.  Depression.  Trouble sleeping (insomnia). Week 3 and afterward After the first 2-3 weeks of quitting, you may start to notice more positive results, such as:  Better sense of smell and taste.  Less coughing and sore throat.  Slower heart rate.  Lower blood pressure.  Clearer skin.  Better breathing.  Fewer sick days. Quitting smoking can be hard. Do not give up if you fail the first time. Some people need to try a few times before they succeed. Do your best to stick to your quit plan, and talk with your doctor if you have any questions or concerns. Summary  Smoking tobacco is the leading cause of preventable death. Quitting smoking can be hard, but it is one of the best things that you can do for your health.  When you decide to quit smoking, make a plan to help you succeed.  Quit smoking right away, not slowly over a period of time.  When you start quitting, seek help from your doctor, family, or friends. This information is not intended to replace advice given to you by your health care provider. Make sure you discuss any questions you have with your health care provider. Document Revised: 10/14/2018 Document Reviewed: 04/09/2018 Elsevier Patient Education  2021 [employer]  

## 2020-04-18 NOTE — Progress Notes (Signed)
[facility] Telephone:(336) [phone]   Fax:(336) [phone]  OFFICE PROGRESS NOTE  [name], MD [address] [place] [address]  DIAGNOSIS:  1) Stage IIIc (T4,N3, M0)non-small cell lung cancer, adenocarcinoma presented with large right hilar mass in addition to bilateral hilar and mediastinal lymphadenopathy diagnosed in December 2020. 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  PRIOR THERAPY: Weekly concurrent chemoradiation with carboplatin for an AUC of 2 and paclitaxel 45 mg/m2. First dose starting 01/30/2019.   Status post 6 cycles.  CURRENT THERAPY:  1) Consolidation immunotherapy with Imfinzi 1500 mg IV every 4 weeks. First dose expected on 04/19/2019.  Status post 12 cycles. 2) Xarelto 15 mg p.o. twice daily for 3 weeks followed by 20 mg p.o. daily.  First dose was given on March 17 2020.  INTERVAL HISTORY: Philip [name] 65 y.o. male returns to the clinic today for follow-up visit.  The patient is feeling fine today with no concerning complaints.  He denied having any current chest pain, shortness of breath, cough or hemoptysis.  He denied having any weight loss or night sweats.  He has no nausea, vomiting, diarrhea or constipation.  He has no headache or visual changes.  He has been tolerating his treatment with immunotherapy fairly well.  The patient is here today for evaluation before the last cycle of his treatment.   MEDICAL HISTORY: Past Medical History:  Diagnosis Date  . Hyperlipidemia   . lung ca dx'd 12/2018  . Pneumonia   . Pulmonary embolus ([place])   . Tobacco abuse     ALLERGIES:  is allergic to norco [hydrocodone-acetaminophen].  MEDICATIONS:  Current Outpatient Medications  Medication Sig Dispense Refill  . albuterol (VENTOLIN HFA) 108 (90 Base) MCG/ACT inhaler Inhale 2 puffs into the lungs every 4 (four)  hours as needed for wheezing or shortness of breath. 18 g 6  . atorvastatin (LIPITOR) 20 MG tablet Take 20 mg by mouth daily.    Marland Kitchen dextromethorphan-guaiFENesin (MUCINEX DM) 30-600 MG 12hr tablet Take 1 tablet by mouth 2 (two) times daily as needed for cough. 40 tablet 0  . famotidine (PEPCID) 20 MG tablet TAKE 1 TABLET BY MOUTH TWICE A DAY (Patient taking differently: Take 20 mg by mouth 2 (two) times daily.) 180 tablet 1  . fexofenadine (ALLEGRA) 180 MG tablet Take 180 mg by mouth daily.    Marland Kitchen HYDROcodone-acetaminophen (NORCO/VICODIN) 5-325 MG tablet Take 1 tablet by mouth every 4 (four) hours as needed for moderate pain. 20 tablet 0  . Nebulizers (COMPRESSOR/NEBULIZER) MISC 1 application by Does not apply route every 4 (four) hours as needed. 1 each 0  . RIVAROXABAN (XARELTO) VTE STARTER PACK (15 & 20 MG) Follow package directions: Take one 15mg  tablet by mouth twice a day. On day 22, switch to one 20mg  tablet once a day. Take with food. 51 each 0  . SPIRIVA RESPIMAT 1.25 MCG/ACT AERS Inhale 2 puffs into the lungs daily.    . Tiotropium Bromide-Olodaterol (STIOLTO RESPIMAT) 2.5-2.5 MCG/ACT AERS Inhale 2 puffs into the lungs daily. (Patient not taking: Reported on 03/14/2020) 3 each 3   No current facility-administered medications for this visit.    SURGICAL HISTORY:  Past Surgical History:  Procedure Laterality Date  . COLONOSCOPY    . HAND SURGERY    . VIDEO BRONCHOSCOPY WITH ENDOBRONCHIAL ULTRASOUND N/A 12/22/2018   Procedure: VIDEO  BRONCHOSCOPY WITH ENDOBRONCHIAL ULTRASOUND;  Surgeon: Garner Nash, DO;  Location: [facility];  Service: Thoracic;  Laterality: N/A;  . VIDEO BRONCHOSCOPY WITH ENDOBRONCHIAL ULTRASOUND N/A 01/09/2019   Procedure: VIDEO BRONCHOSCOPY WITH ENDOBRONCHIAL ULTRASOUND WITH FLUORO;  Surgeon: Garner Nash, DO;  Location: [facility];  Service: Thoracic;  Laterality: N/A;  . VIDEO BRONCHOSCOPY WITH RADIAL ENDOBRONCHIAL ULTRASOUND N/A 12/22/2018   Procedure: RADIAL  ENDOBRONCHIAL ULTRASOUND;  Surgeon: Garner Nash, DO;  Location: [facility];  Service: Thoracic;  Laterality: N/A;    REVIEW OF SYSTEMS:  A comprehensive review of systems was negative.   PHYSICAL EXAMINATION: General appearance: alert, cooperative and no distress Head: Normocephalic, without obvious abnormality, atraumatic Neck: no adenopathy, no JVD, supple, symmetrical, trachea midline and thyroid not enlarged, symmetric, no tenderness/mass/nodules Lymph nodes: Cervical, supraclavicular, and axillary nodes normal. Resp: clear to auscultation bilaterally Back: symmetric, no curvature. ROM normal. No CVA tenderness. Cardio: regular rate and rhythm, S1, S2 normal, no murmur, click, rub or gallop GI: soft, non-tender; bowel sounds normal; no masses,  no organomegaly Extremities: extremities normal, atraumatic, no cyanosis or edema  ECOG PERFORMANCE STATUS: 1 - Symptomatic but completely ambulatory  Blood pressure 93/63, pulse 74, temperature (!) 97 F (36.1 C), temperature source Tympanic, resp. rate 15, height 5' 6.5" (1.689 m), weight 191 lb 11.2 oz (87 kg), SpO2 98 %.  LABORATORY DATA: Lab Results  Component Value Date   WBC 6.7 03/21/2020   HGB 13.8 03/21/2020   HCT 41.6 03/21/2020   MCV 95.6 03/21/2020   PLT 265 03/21/2020      Chemistry      Component Value Date/Time   NA 140 03/21/2020 0823   K 3.8 03/21/2020 0823   CL 108 03/21/2020 0823   CO2 24 03/21/2020 0823   BUN 21 03/21/2020 0823   CREATININE 0.94 03/21/2020 0823      Component Value Date/Time   CALCIUM 8.8 (L) 03/21/2020 0823   ALKPHOS 115 03/21/2020 0823   AST 30 03/21/2020 0823   ALT 65 (H) 03/21/2020 0823   BILITOT 0.3 03/21/2020 0823       RADIOGRAPHIC STUDIES: No results found.  ASSESSMENT AND PLAN: This is a very pleasant 65 years old white male with a stage IIIc non-small cell lung cancer, adenocarcinoma diagnosed in December 2020 The patient completed a course of concurrent chemoradiation  with weekly carboplatin and paclitaxel status post 6 cycles and he tolerated his treatment well and has partial response. He is currently undergoing consolidation treatment with immunotherapy with Imfinzi 1500 mg IV every 4 weeks status post 12 cycles. The patient has been tolerating this treatment well with no concerning adverse effects. I recommended for him to proceed with cycle #16 which is the last cycle of this course as planned today. I will see him back for follow-up visit in 1 months for evaluation with repeat CT scan of the chest for restaging of his disease. For the pulmonary embolism, he will continue his current treatment with Xarelto. The patient was advised to call immediately if he has any other concerning symptoms in the interval. The patient voices understanding of current disease status and treatment options and is in agreement with the current care plan. All questions were answered. The patient knows to call the clinic with any problems, questions or concerns. We can certainly see the patient much sooner if necessary.  Disclaimer: This note was dictated with voice recognition software. Similar sounding words can inadvertently be transcribed and may not be corrected upon  review.

## 2020-04-19 ENCOUNTER — Telehealth: Payer: Self-pay | Admitting: Internal Medicine

## 2020-04-19 NOTE — Telephone Encounter (Signed)
Scheduled per 3/17 los. Called pt and left a msg

## 2020-05-17 ENCOUNTER — Ambulatory Visit (HOSPITAL_COMMUNITY): Payer: No Typology Code available for payment source

## 2020-05-17 ENCOUNTER — Ambulatory Visit (HOSPITAL_COMMUNITY)
Admission: RE | Admit: 2020-05-17 | Discharge: 2020-05-17 | Disposition: A | Discharge status: Home / Self Care | Payer: No Typology Code available for payment source | Source: Ambulatory Visit | Attending: Internal Medicine | Admitting: Internal Medicine

## 2020-05-17 ENCOUNTER — Inpatient Hospital Stay: Payer: No Typology Code available for payment source | Attending: Internal Medicine

## 2020-05-17 ENCOUNTER — Other Ambulatory Visit: Payer: Self-pay

## 2020-05-17 ENCOUNTER — Encounter (HOSPITAL_COMMUNITY): Payer: Self-pay

## 2020-05-17 DIAGNOSIS — D3502 Benign neoplasm of left adrenal gland: Secondary | ICD-10-CM | POA: Insufficient documentation

## 2020-05-17 DIAGNOSIS — I2510 Atherosclerotic heart disease of native coronary artery without angina pectoris: Secondary | ICD-10-CM | POA: Insufficient documentation

## 2020-05-17 DIAGNOSIS — R590 Localized enlarged lymph nodes: Secondary | ICD-10-CM | POA: Insufficient documentation

## 2020-05-17 DIAGNOSIS — Z79899 Other long term (current) drug therapy: Secondary | ICD-10-CM | POA: Insufficient documentation

## 2020-05-17 DIAGNOSIS — Z923 Personal history of irradiation: Secondary | ICD-10-CM | POA: Insufficient documentation

## 2020-05-17 DIAGNOSIS — C3401 Malignant neoplasm of right main bronchus: Secondary | ICD-10-CM | POA: Insufficient documentation

## 2020-05-17 DIAGNOSIS — M47814 Spondylosis without myelopathy or radiculopathy, thoracic region: Secondary | ICD-10-CM | POA: Insufficient documentation

## 2020-05-17 DIAGNOSIS — C3490 Malignant neoplasm of unspecified part of unspecified bronchus or lung: Secondary | ICD-10-CM | POA: Diagnosis not present

## 2020-05-17 DIAGNOSIS — Z9221 Personal history of antineoplastic chemotherapy: Secondary | ICD-10-CM | POA: Insufficient documentation

## 2020-05-17 DIAGNOSIS — Z7901 Long term (current) use of anticoagulants: Secondary | ICD-10-CM | POA: Insufficient documentation

## 2020-05-17 DIAGNOSIS — J432 Centrilobular emphysema: Secondary | ICD-10-CM | POA: Insufficient documentation

## 2020-05-17 DIAGNOSIS — Z86711 Personal history of pulmonary embolism: Secondary | ICD-10-CM | POA: Insufficient documentation

## 2020-05-17 DIAGNOSIS — E785 Hyperlipidemia, unspecified: Secondary | ICD-10-CM | POA: Insufficient documentation

## 2020-05-17 DIAGNOSIS — D3501 Benign neoplasm of right adrenal gland: Secondary | ICD-10-CM | POA: Insufficient documentation

## 2020-05-17 LAB — CBC WITH DIFFERENTIAL (CANCER CENTER ONLY)
Abs Immature Granulocytes: 0.02 10*3/uL (ref 0.00–0.07)
Basophils Absolute: 0 10*3/uL (ref 0.0–0.1)
Basophils Relative: 1 %
Eosinophils Absolute: 0.2 10*3/uL (ref 0.0–0.5)
Eosinophils Relative: 4 %
HCT: 46.6 % (ref 39.0–52.0)
Hemoglobin: 15.6 g/dL (ref 13.0–17.0)
Immature Granulocytes: 0 %
Lymphocytes Relative: 19 %
Lymphs Abs: 1 10*3/uL (ref 0.7–4.0)
MCH: 31.3 pg (ref 26.0–34.0)
MCHC: 33.5 g/dL (ref 30.0–36.0)
MCV: 93.4 fL (ref 80.0–100.0)
Monocytes Absolute: 0.6 10*3/uL (ref 0.1–1.0)
Monocytes Relative: 11 %
Neutro Abs: 3.3 10*3/uL (ref 1.7–7.7)
Neutrophils Relative %: 65 %
Platelet Count: 221 10*3/uL (ref 150–400)
RBC: 4.99 MIL/uL (ref 4.22–5.81)
RDW: 12.7 % (ref 11.5–15.5)
WBC Count: 5.1 10*3/uL (ref 4.0–10.5)
nRBC: 0 % (ref 0.0–0.2)

## 2020-05-17 LAB — CMP (CANCER CENTER ONLY)
ALT: 19 U/L (ref 0–44)
AST: 19 U/L (ref 15–41)
Albumin: 3.8 g/dL (ref 3.5–5.0)
Alkaline Phosphatase: 89 U/L (ref 38–126)
Anion gap: 10 (ref 5–15)
BUN: 17 mg/dL (ref 8–23)
CO2: 25 mmol/L (ref 22–32)
Calcium: 9.3 mg/dL (ref 8.9–10.3)
Chloride: 104 mmol/L (ref 98–111)
Creatinine: 0.98 mg/dL (ref 0.61–1.24)
GFR, Estimated: >60 mL/min (ref >60)
Glucose, Bld: 96 mg/dL (ref 70–99)
Potassium: 4.6 mmol/L (ref 3.5–5.1)
Sodium: 139 mmol/L (ref 135–145)
Total Bilirubin: 0.7 mg/dL (ref 0.3–1.2)
Total Protein: 7.1 g/dL (ref 6.5–8.1)

## 2020-05-17 MED ORDER — IOHEXOL 300 MG/ML  SOLN
75.0000 mL | Freq: Once | INTRAMUSCULAR | Status: AC | PRN
  Administered 2020-05-17: 75 mL via INTRAVENOUS

## 2020-05-20 ENCOUNTER — Inpatient Hospital Stay (HOSPITAL_BASED_OUTPATIENT_CLINIC_OR_DEPARTMENT_OTHER): Payer: No Typology Code available for payment source | Admitting: Internal Medicine

## 2020-05-20 ENCOUNTER — Other Ambulatory Visit: Payer: Self-pay

## 2020-05-20 VITALS — BP 98/78 | HR 95 | Temp 97.4°F | Resp 20 | Ht 66.5 in | Wt 186.2 lb

## 2020-05-20 DIAGNOSIS — Z5112 Encounter for antineoplastic immunotherapy: Secondary | ICD-10-CM | POA: Diagnosis not present

## 2020-05-20 DIAGNOSIS — M47814 Spondylosis without myelopathy or radiculopathy, thoracic region: Secondary | ICD-10-CM | POA: Diagnosis not present

## 2020-05-20 DIAGNOSIS — Z86711 Personal history of pulmonary embolism: Secondary | ICD-10-CM | POA: Diagnosis not present

## 2020-05-20 DIAGNOSIS — J432 Centrilobular emphysema: Secondary | ICD-10-CM | POA: Diagnosis not present

## 2020-05-20 DIAGNOSIS — Z9221 Personal history of antineoplastic chemotherapy: Secondary | ICD-10-CM | POA: Diagnosis not present

## 2020-05-20 DIAGNOSIS — I2510 Atherosclerotic heart disease of native coronary artery without angina pectoris: Secondary | ICD-10-CM | POA: Diagnosis not present

## 2020-05-20 DIAGNOSIS — C3491 Malignant neoplasm of unspecified part of right bronchus or lung: Secondary | ICD-10-CM

## 2020-05-20 DIAGNOSIS — E785 Hyperlipidemia, unspecified: Secondary | ICD-10-CM | POA: Diagnosis not present

## 2020-05-20 DIAGNOSIS — Z923 Personal history of irradiation: Secondary | ICD-10-CM | POA: Diagnosis not present

## 2020-05-20 DIAGNOSIS — C3490 Malignant neoplasm of unspecified part of unspecified bronchus or lung: Secondary | ICD-10-CM

## 2020-05-20 DIAGNOSIS — Z7901 Long term (current) use of anticoagulants: Secondary | ICD-10-CM | POA: Diagnosis not present

## 2020-05-20 DIAGNOSIS — R590 Localized enlarged lymph nodes: Secondary | ICD-10-CM | POA: Diagnosis not present

## 2020-05-20 DIAGNOSIS — Z79899 Other long term (current) drug therapy: Secondary | ICD-10-CM | POA: Diagnosis not present

## 2020-05-20 DIAGNOSIS — D3502 Benign neoplasm of left adrenal gland: Secondary | ICD-10-CM | POA: Diagnosis not present

## 2020-05-20 DIAGNOSIS — D3501 Benign neoplasm of right adrenal gland: Secondary | ICD-10-CM | POA: Diagnosis not present

## 2020-05-20 DIAGNOSIS — C3401 Malignant neoplasm of right main bronchus: Secondary | ICD-10-CM | POA: Diagnosis not present

## 2020-05-20 NOTE — Progress Notes (Signed)
[facility] [phone]   Fax:(336) [phone]  OFFICE PROGRESS NOTE  [name], MD [address] [place] [address]  DIAGNOSIS:  1) Stage IIIc (T4,N3, M0)non-small cell lung cancer, adenocarcinoma presented with large right hilar mass in addition to bilateral hilar and mediastinal lymphadenopathy diagnosed in December 2020. 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  PRIOR THERAPY:  1) Weekly concurrent chemoradiation with carboplatin for an AUC of 2 and paclitaxel 45 mg/m2. First dose starting 01/30/2019.   Status post 6 cycles. 2) Consolidation immunotherapy with Imfinzi 1500 mg IV every 4 weeks. First dose expected on 04/19/2019.  Status post 13 cycles.  CURRENT THERAPY:  Xarelto 15 mg p.o. twice daily for 3 weeks followed by 20 mg p.o. daily.  First dose was given on March 17 2020.  INTERVAL HISTORY: Philip [name] 65 y.o. male returns to the clinic today for follow-up visit accompanied by his wife.  The patient is feeling fine today with no concerning complaints except for persistent cough and wheezing.  He denied having any current chest pain, shortness of breath, or hemoptysis.  He denied having any fever or chills.  He has no nausea, vomiting, diarrhea or constipation.  He has no headache or visual changes.  He has no bleeding issues.  He tolerated the previous course of consolidation treatment with Imfinzi fairly well.  The patient had repeat CT scan of the chest performed recently and he is here for evaluation and discussion of his scan results.    MEDICAL HISTORY: Past Medical History:  Diagnosis Date  . Hyperlipidemia   . lung ca dx'd 12/2018  . Pneumonia   . Pulmonary embolus ([name])   . Tobacco abuse     ALLERGIES:  is allergic to norco [hydrocodone-acetaminophen].  MEDICATIONS:  Current Outpatient  Medications  Medication Sig Dispense Refill  . albuterol (VENTOLIN HFA) 108 (90 Base) MCG/ACT inhaler Inhale 2 puffs into the lungs every 4 (four) hours as needed for wheezing or shortness of breath. 18 g 6  . atorvastatin (LIPITOR) 20 MG tablet Take 20 mg by mouth daily.    Marland Kitchen dextromethorphan-guaiFENesin (MUCINEX DM) 30-600 MG 12hr tablet Take 1 tablet by mouth 2 (two) times daily as needed for cough. 40 tablet 0  . famotidine (PEPCID) 20 MG tablet TAKE 1 TABLET BY MOUTH TWICE A DAY (Patient taking differently: Take 20 mg by mouth 2 (two) times daily.) 180 tablet 1  . fexofenadine (ALLEGRA) 180 MG tablet Take 180 mg by mouth daily.    Marland Kitchen HYDROcodone-acetaminophen (NORCO/VICODIN) 5-325 MG tablet Take 1 tablet by mouth every 4 (four) hours as needed for moderate pain. 20 tablet 0  . Nebulizers (COMPRESSOR/NEBULIZER) MISC 1 application by Does not apply route every 4 (four) hours as needed. 1 each 0  . RIVAROXABAN (XARELTO) VTE STARTER PACK (15 & 20 MG) Follow package directions: Take one 15mg  tablet by mouth twice a day. On day 22, switch to one 20mg  tablet once a day. Take with food. 51 each 0  . SPIRIVA RESPIMAT 1.25 MCG/ACT AERS Inhale 2 puffs into the lungs daily.    . Tiotropium Bromide-Olodaterol (STIOLTO RESPIMAT) 2.5-2.5 MCG/ACT AERS Inhale 2 puffs into the lungs daily. (Patient not taking: Reported on 03/14/2020) 3 each 3   No current facility-administered medications for this visit.    SURGICAL HISTORY:  Past Surgical History:  Procedure  Laterality Date  . COLONOSCOPY    . HAND SURGERY    . VIDEO BRONCHOSCOPY WITH ENDOBRONCHIAL ULTRASOUND N/A 12/22/2018   Procedure: VIDEO BRONCHOSCOPY WITH ENDOBRONCHIAL ULTRASOUND;  Surgeon: Garner Nash, DO;  Location: [name];  Service: Thoracic;  Laterality: N/A;  . VIDEO BRONCHOSCOPY WITH ENDOBRONCHIAL ULTRASOUND N/A 01/09/2019   Procedure: VIDEO BRONCHOSCOPY WITH ENDOBRONCHIAL ULTRASOUND WITH FLUORO;  Surgeon: Garner Nash, DO;  Location: [facility];  Service: Thoracic;  Laterality: N/A;  . VIDEO BRONCHOSCOPY WITH RADIAL ENDOBRONCHIAL ULTRASOUND N/A 12/22/2018   Procedure: RADIAL ENDOBRONCHIAL ULTRASOUND;  Surgeon: Garner Nash, DO;  Location[facility];  Service: Thoracic;  Laterality: N/A;    REVIEW OF SYSTEMS:  Constitutional: negative Eyes: negative Ears, nose, mouth, throat, and face: negative Respiratory: positive for cough and wheezing Cardiovascular: negative Gastrointestinal: negative Genitourinary:negative Integument/breast: negative Hematologic/lymphatic: negative Musculoskeletal:negative Neurological: negative Behavioral/Psych: negative Endocrine: negative Allergic/Immunologic: negative   PHYSICAL EXAMINATION: General appearance: alert, cooperative and no distress Head: Normocephalic, without obvious abnormality, atraumatic Neck: no adenopathy, no JVD, supple, symmetrical, trachea midline and thyroid not enlarged, symmetric, no tenderness/mass/nodules Lymph nodes: Cervical, supraclavicular, and axillary nodes normal. Resp: wheezes bilaterally Back: symmetric, no curvature. ROM normal. No CVA tenderness. Cardio: regular rate and rhythm, S1, S2 normal, no murmur, click, rub or gallop GI: soft, non-tender; bowel sounds normal; no masses,  no organomegaly Extremities: extremities normal, atraumatic, no cyanosis or edema Neurologic: Alert and oriented X 3, normal strength and tone. Normal symmetric reflexes. Normal coordination and gait  ECOG PERFORMANCE STATUS: 1 - Symptomatic but completely ambulatory  Blood pressure 98/78, pulse 95, temperature (!) 97.4 F (36.3 C), temperature source Oral, resp. rate 20, height 5' 6.5" (1.689 m), weight 186 lb 3.2 oz (84.5 kg), SpO2 97 %.  LABORATORY DATA: Lab Results  Component Value Date   WBC 5.1 05/17/2020   HGB 15.6 05/17/2020   HCT 46.6 05/17/2020   MCV 93.4 05/17/2020   PLT 221 05/17/2020      Chemistry      Component Value Date/Time   NA 139 05/17/2020 0830    K 4.6 05/17/2020 0830   CL 104 05/17/2020 0830   CO2 25 05/17/2020 0830   BUN 17 05/17/2020 0830   CREATININE 0.98 05/17/2020 0830      Component Value Date/Time   CALCIUM 9.3 05/17/2020 0830   ALKPHOS 89 05/17/2020 0830   AST 19 05/17/2020 0830   ALT 19 05/17/2020 0830   BILITOT 0.7 05/17/2020 0830       RADIOGRAPHIC STUDIES: CT Chest W Contrast  Result Date: 05/19/2020 CLINICAL DATA:  Non-small cell lung cancer, prior chemotherapy, radiation therapy, and immunotherapy. Cough and shortness of breath. EXAM: CT CHEST WITH CONTRAST TECHNIQUE: Multidetector CT imaging of the chest was performed during intravenous contrast administration. CONTRAST:  84mL OMNIPAQUE IOHEXOL 300 MG/ML  SOLN COMPARISON:  03/14/2020 FINDINGS: Cardiovascular: Atherosclerotic calcification of the aortic arch, the left main coronary artery, the right coronary artery, in the left anterior descending coronary artery. Complete truncation/occlusion of the basal portion of the right lower lobe pulmonary artery shown on images 73 through 78 of series 2, presumably related to prior pulmonary embolus. Mediastinum/Nodes: Prevascular node node 1.0 cm in short axis on image 39 series 2, formerly 0.8 cm on 03/14/2020. AP window lymph node 0.9 cm in short axis on image 53 series 2, formerly 0.8 cm. Borderline prominence of hilar and infrahilar nodal tissue. Lungs/Pleura: Centrilobular emphysema. Considerable right perihilar consolidation much of which may be related to radiation  therapy. Volume loss and continued airspace opacity in the basal portion of the right lower lobe corresponding to the region of pulmonary arterial occlusion. Continued mild right infrahilar air bronchograms related to chronic airspace opacity, possibly related to prior radiation therapy. Continued bandlike scarring anteriorly in the left upper lobe extending to the hilum. I not see obvious new or progressive nodules. Upper Abdomen: Stable left adrenal adenoma.  Stable small right adrenal adenoma. Musculoskeletal: Thoracic spondylosis. IMPRESSION: 1. Occluded basilar segment of the right lower lobe pulmonary artery, likely due to the prior pulmonary embolus. 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 2. Right perihilar bandlike regions of consolidation probably related to prior radiation therapy. Similar left perihilar findings. These are not appreciably changed. 3. Other imaging findings of potential clinical significance: Aortic Atherosclerosis (ICD10-I70.0) and Emphysema (ICD10-J43.9). Coronary atherosclerosis. Borderline mediastinal adenopathy including a 1.0 cm prevascular lymph node. Stable bilateral adrenal adenomas. Electronically Signed   By: Van Clines M.D.   On: 05/19/2020 13:28    ASSESSMENT AND PLAN: This is a very pleasant 65 years old white male with a stage IIIc non-small cell lung cancer, adenocarcinoma diagnosed in December 2020 The patient completed a course of concurrent chemoradiation with weekly carboplatin and paclitaxel status post 6 cycles and he tolerated his treatment well and has partial response. He underwent consolidation treatment with immunotherapy with Imfinzi 1500 mg IV every 4 weeks status post 13 cycles. The patient tolerated his treatment fairly well with no concerning adverse effects. He had repeat CT scan of the chest performed recently.  I personally and independently reviewed the scans and discussed the results with the patient and his wife. Has a scan showed no concerning findings for disease progression. I recommended for him to continue on observation with repeat CT scan of the chest in 3 months. For the pulmonary embolism diagnosed in February 2022, he will continue his current treatment with Xarelto. The patient was advised to call immediately if he has any other concerning symptoms in the interval.  The patient voices understanding of  current disease status and treatment options and is in agreement with the current care plan. All questions were answered. The patient knows to call the clinic with any problems, questions or concerns. We can certainly see the patient much sooner if necessary.  Disclaimer: This note was dictated with voice recognition software. Similar sounding words can inadvertently be transcribed and may not be corrected upon review.

## 2020-05-27 ENCOUNTER — Other Ambulatory Visit: Payer: Self-pay | Admitting: Critical Care Medicine

## 2020-06-05 ENCOUNTER — Telehealth: Payer: Self-pay | Admitting: Internal Medicine

## 2020-06-05 NOTE — Telephone Encounter (Signed)
Sch per 4/18 los, Patient aware.

## 2020-07-09 ENCOUNTER — Other Ambulatory Visit: Payer: Self-pay | Admitting: Internal Medicine

## 2020-07-09 ENCOUNTER — Encounter: Payer: Self-pay | Admitting: *Deleted

## 2020-07-09 MED ORDER — HYDROCODONE-ACETAMINOPHEN 5-325 MG PO TABS: 1.0000 | ORAL_TABLET | ORAL | 0 refills | Status: DC | PRN

## 2020-07-09 NOTE — Progress Notes (Signed)
I received a call from Philip [name]. He needs pain medication refilled. I updated Dr. Julien Nordmann and his team.

## 2020-07-10 ENCOUNTER — Telehealth: Payer: Self-pay

## 2020-07-10 NOTE — Telephone Encounter (Signed)
I've spoken with the pt and advised as indicated. Pt expressed understanding of this information and will submit future requests to his PCP.

## 2020-07-10 NOTE — Telephone Encounter (Signed)
-----   Message from [name], MD sent at 07/09/2020  5:17 PM EDT ----- Regarding: RE: need medication refill I will send a partial refill.  No evidence on the scan for any lesion to cause his pain.  He has thoracic spondylosis.  He may need future refill from his primary care physician.  Thank you. ----- Message ----- From: Valrie Hart, RN Sent: 07/09/2020  12:40 PM EDT To: [name], MD, Ardeen Garland, RN, # Subject: need medication refill                         Hey there- Mr. Dacus called me today and states he needs pain medication refill.  It looks like Norco/Vicodin ordered last time in February.  Please advise, thanks Hinton Dyer

## 2020-07-26 ENCOUNTER — Other Ambulatory Visit: Payer: Self-pay | Admitting: Internal Medicine

## 2020-07-26 DIAGNOSIS — R1310 Dysphagia, unspecified: Secondary | ICD-10-CM

## 2020-08-19 ENCOUNTER — Inpatient Hospital Stay: Payer: No Typology Code available for payment source | Attending: Internal Medicine

## 2020-08-19 ENCOUNTER — Other Ambulatory Visit: Payer: No Typology Code available for payment source

## 2020-08-19 ENCOUNTER — Other Ambulatory Visit: Payer: Self-pay

## 2020-08-19 ENCOUNTER — Ambulatory Visit (HOSPITAL_COMMUNITY)
Admission: RE | Admit: 2020-08-19 | Discharge: 2020-08-19 | Disposition: A | Discharge status: Home / Self Care | Payer: No Typology Code available for payment source | Source: Ambulatory Visit | Attending: Internal Medicine | Admitting: Internal Medicine

## 2020-08-19 DIAGNOSIS — Z885 Allergy status to narcotic agent status: Secondary | ICD-10-CM | POA: Diagnosis not present

## 2020-08-19 DIAGNOSIS — Z923 Personal history of irradiation: Secondary | ICD-10-CM | POA: Diagnosis not present

## 2020-08-19 DIAGNOSIS — Z79899 Other long term (current) drug therapy: Secondary | ICD-10-CM | POA: Insufficient documentation

## 2020-08-19 DIAGNOSIS — Z7901 Long term (current) use of anticoagulants: Secondary | ICD-10-CM | POA: Insufficient documentation

## 2020-08-19 DIAGNOSIS — I700 Atherosclerosis of aorta: Secondary | ICD-10-CM | POA: Diagnosis not present

## 2020-08-19 DIAGNOSIS — D3501 Benign neoplasm of right adrenal gland: Secondary | ICD-10-CM | POA: Insufficient documentation

## 2020-08-19 DIAGNOSIS — M549 Dorsalgia, unspecified: Secondary | ICD-10-CM | POA: Diagnosis not present

## 2020-08-19 DIAGNOSIS — Z86711 Personal history of pulmonary embolism: Secondary | ICD-10-CM | POA: Diagnosis not present

## 2020-08-19 DIAGNOSIS — C3431 Malignant neoplasm of lower lobe, right bronchus or lung: Secondary | ICD-10-CM | POA: Insufficient documentation

## 2020-08-19 DIAGNOSIS — D3502 Benign neoplasm of left adrenal gland: Secondary | ICD-10-CM | POA: Insufficient documentation

## 2020-08-19 DIAGNOSIS — J439 Emphysema, unspecified: Secondary | ICD-10-CM | POA: Insufficient documentation

## 2020-08-19 DIAGNOSIS — C3490 Malignant neoplasm of unspecified part of unspecified bronchus or lung: Secondary | ICD-10-CM

## 2020-08-19 DIAGNOSIS — R590 Localized enlarged lymph nodes: Secondary | ICD-10-CM | POA: Diagnosis not present

## 2020-08-19 LAB — CBC WITH DIFFERENTIAL (CANCER CENTER ONLY)
Abs Immature Granulocytes: 0.02 10*3/uL (ref 0.00–0.07)
Basophils Absolute: 0.1 10*3/uL (ref 0.0–0.1)
Basophils Relative: 1 %
Eosinophils Absolute: 0.2 10*3/uL (ref 0.0–0.5)
Eosinophils Relative: 3 %
HCT: 47.6 % (ref 39.0–52.0)
Hemoglobin: 16.4 g/dL (ref 13.0–17.0)
Immature Granulocytes: 0 %
Lymphocytes Relative: 21 %
Lymphs Abs: 1.6 10*3/uL (ref 0.7–4.0)
MCH: 31.8 pg (ref 26.0–34.0)
MCHC: 34.5 g/dL (ref 30.0–36.0)
MCV: 92.4 fL (ref 80.0–100.0)
Monocytes Absolute: 0.7 10*3/uL (ref 0.1–1.0)
Monocytes Relative: 10 %
Neutro Abs: 4.9 10*3/uL (ref 1.7–7.7)
Neutrophils Relative %: 65 %
Platelet Count: 220 10*3/uL (ref 150–400)
RBC: 5.15 MIL/uL (ref 4.22–5.81)
RDW: 12.5 % (ref 11.5–15.5)
WBC Count: 7.5 10*3/uL (ref 4.0–10.5)
nRBC: 0 % (ref 0.0–0.2)

## 2020-08-19 LAB — CMP (CANCER CENTER ONLY)
ALT: 24 U/L (ref 0–44)
AST: 19 U/L (ref 15–41)
Albumin: 3.8 g/dL (ref 3.5–5.0)
Alkaline Phosphatase: 86 U/L (ref 38–126)
Anion gap: 12 (ref 5–15)
BUN: 15 mg/dL (ref 8–23)
CO2: 25 mmol/L (ref 22–32)
Calcium: 9.5 mg/dL (ref 8.9–10.3)
Chloride: 103 mmol/L (ref 98–111)
Creatinine: 1.12 mg/dL (ref 0.61–1.24)
GFR, Estimated: >60 mL/min (ref >60)
Glucose, Bld: 103 mg/dL — ABNORMAL HIGH (ref 70–99)
Potassium: 4.3 mmol/L (ref 3.5–5.1)
Sodium: 140 mmol/L (ref 135–145)
Total Bilirubin: 0.7 mg/dL (ref 0.3–1.2)
Total Protein: 7.7 g/dL (ref 6.5–8.1)

## 2020-08-19 MED ORDER — IOHEXOL 350 MG/ML SOLN
75.0000 mL | Freq: Once | INTRAVENOUS | Status: AC | PRN
  Administered 2020-08-19: 75 mL via INTRAVENOUS

## 2020-08-20 ENCOUNTER — Inpatient Hospital Stay (HOSPITAL_BASED_OUTPATIENT_CLINIC_OR_DEPARTMENT_OTHER): Payer: No Typology Code available for payment source | Admitting: Internal Medicine

## 2020-08-20 VITALS — BP 97/83 | HR 98 | Temp 95.0°F | Resp 20 | Ht 66.5 in | Wt 185.2 lb

## 2020-08-20 DIAGNOSIS — C3491 Malignant neoplasm of unspecified part of right bronchus or lung: Secondary | ICD-10-CM

## 2020-08-20 DIAGNOSIS — C3490 Malignant neoplasm of unspecified part of unspecified bronchus or lung: Secondary | ICD-10-CM | POA: Diagnosis not present

## 2020-08-20 DIAGNOSIS — C3431 Malignant neoplasm of lower lobe, right bronchus or lung: Secondary | ICD-10-CM | POA: Diagnosis not present

## 2020-08-20 NOTE — Progress Notes (Signed)
[place] [phone]   [phone]  OFFICE PROGRESS NOTE  [name], MD [address] [place] [address]  DIAGNOSIS:  1) Stage IIIc (T4, N3, M0) non-small cell lung cancer, adenocarcinoma presented with large right hilar mass in addition to bilateral hilar and mediastinal lymphadenopathy diagnosed in December 2020. 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   PRIOR THERAPY:  1) Weekly concurrent chemoradiation with carboplatin for an AUC of 2 and paclitaxel 45 mg/m2. First dose starting 01/30/2019.   Status post 6 cycles. 2) Consolidation immunotherapy with Imfinzi 1500 mg IV every 4 weeks. First dose expected on 04/19/2019.  Status post 13 cycles.   CURRENT THERAPY:  Xarelto 15 mg p.o. twice daily for 3 weeks followed by 20 mg p.o. daily.  First dose was given on March 17 2020.  INTERVAL HISTORY: Philip [name] 65 y.o. male returns to the clinic today for follow-up visit.  The patient is feeling fine today with no concerning complaints.  He denied having any chest pain, shortness of breath except with exertion and he continues to have mild cough and requesting refill of hydrocodone.  He denied having any recent weight loss or night sweats.  He has no nausea, vomiting, diarrhea or constipation.  He has no headache or visual changes.  He also has a lot of muscle and back pain and he is currently followed by chiropractor.  He had repeat CT scan of the chest performed yesterday and the patient is here today for evaluation and discussion of his scan results.   MEDICAL HISTORY: Past Medical History:  Diagnosis Date   Hyperlipidemia    lung ca dx'd 12/2018   Pneumonia    Pulmonary embolus (HCC)    Tobacco abuse     ALLERGIES:  is allergic to norco [hydrocodone-acetaminophen].  MEDICATIONS:  Current Outpatient  Medications  Medication Sig Dispense Refill   atorvastatin (LIPITOR) 20 MG tablet Take 20 mg by mouth daily.     famotidine (PEPCID) 20 MG tablet TAKE 1 TABLET BY MOUTH TWICE A DAY 180 tablet 1   fexofenadine (ALLEGRA) 180 MG tablet Take 180 mg by mouth daily.     HYDROcodone-acetaminophen (NORCO/VICODIN) 5-325 MG tablet Take 1 tablet by mouth every 4 (four) hours as needed for moderate pain. 15 tablet 0   RIVAROXABAN (XARELTO) VTE STARTER PACK (15 & 20 MG) Follow package directions: Take one 15mg  tablet by mouth twice a day. On day 22, switch to one 20mg  tablet once a day. Take with food. 51 each 0   SPIRIVA RESPIMAT 1.25 MCG/ACT AERS Inhale 2 puffs into the lungs daily.     albuterol (VENTOLIN HFA) 108 (90 Base) MCG/ACT inhaler Inhale 2 puffs into the lungs every 4 (four) hours as needed for wheezing or shortness of breath. (Patient not taking: Reported on 08/20/2020) 18 g 6   dextromethorphan-guaiFENesin (MUCINEX DM) 30-600 MG 12hr tablet Take 1 tablet by mouth 2 (two) times daily as needed for cough. (Patient not taking: Reported on 08/20/2020) 40 tablet 0   Nebulizers (COMPRESSOR/NEBULIZER) MISC 1 application by Does not apply route every 4 (four) hours as needed. (Patient not taking: Reported on 08/20/2020) 1 each 0   Tiotropium Bromide-Olodaterol (STIOLTO RESPIMAT) 2.5-2.5 MCG/ACT AERS Inhale 2 puffs into the lungs daily. (Patient not taking: No sig reported) 3 each 3   No current facility-administered medications  for this visit.    SURGICAL HISTORY:  Past Surgical History:  Procedure Laterality Date   COLONOSCOPY     HAND SURGERY     VIDEO BRONCHOSCOPY WITH ENDOBRONCHIAL ULTRASOUND N/A 12/22/2018   Procedure: VIDEO BRONCHOSCOPY WITH ENDOBRONCHIAL ULTRASOUND;  Surgeon: Garner Nash, DO;  Location: [place];  Service: Thoracic;  Laterality: N/A;   VIDEO BRONCHOSCOPY WITH ENDOBRONCHIAL ULTRASOUND N/A 01/09/2019   Procedure: VIDEO BRONCHOSCOPY WITH ENDOBRONCHIAL ULTRASOUND WITH FLUORO;   Surgeon: Garner Nash, DO;  Location: [place];  Service: Thoracic;  Laterality: N/A;   VIDEO BRONCHOSCOPY WITH RADIAL ENDOBRONCHIAL ULTRASOUND N/A 12/22/2018   Procedure: RADIAL ENDOBRONCHIAL ULTRASOUND;  Surgeon: Garner Nash, DO;  Location: [place];  Service: Thoracic;  Laterality: N/A;    REVIEW OF SYSTEMS:  A comprehensive review of systems was negative except for: Respiratory: positive for cough Musculoskeletal: positive for back pain   PHYSICAL EXAMINATION: General appearance: alert, cooperative, fatigued, and no distress Head: Normocephalic, without obvious abnormality, atraumatic Neck: no adenopathy, no JVD, supple, symmetrical, trachea midline, and thyroid not enlarged, symmetric, no tenderness/mass/nodules Lymph nodes: Cervical, supraclavicular, and axillary nodes normal. Resp: clear to auscultation bilaterally Back: symmetric, no curvature. ROM normal. No CVA tenderness. Cardio: regular rate and rhythm, S1, S2 normal, no murmur, click, rub or gallop GI: soft, non-tender; bowel sounds normal; no masses,  no organomegaly Extremities: extremities normal, atraumatic, no cyanosis or edema  ECOG PERFORMANCE STATUS: 1 - Symptomatic but completely ambulatory  Blood pressure 97/83, pulse 98, temperature (!) 95 F (35 C), temperature source Tympanic, resp. rate 20, height 5' 6.5" (1.689 m), weight 185 lb 3.2 oz (84 kg), SpO2 97 %.  LABORATORY DATA: Lab Results  Component Value Date   WBC 7.5 08/19/2020   HGB 16.4 08/19/2020   HCT 47.6 08/19/2020   MCV 92.4 08/19/2020   PLT 220 08/19/2020      Chemistry      Component Value Date/Time   NA 140 08/19/2020 1533   K 4.3 08/19/2020 1533   CL 103 08/19/2020 1533   CO2 25 08/19/2020 1533   BUN 15 08/19/2020 1533   CREATININE 1.12 08/19/2020 1533      Component Value Date/Time   CALCIUM 9.5 08/19/2020 1533   ALKPHOS 86 08/19/2020 1533   AST 19 08/19/2020 1533   ALT 24 08/19/2020 1533   BILITOT 0.7 08/19/2020 1533        RADIOGRAPHIC STUDIES: CT Chest W Contrast  Result Date: 08/20/2020 CLINICAL DATA:  Restaging non-small cell cancer. EXAM: CT CHEST WITH CONTRAST TECHNIQUE: Multidetector CT imaging of the chest was performed during intravenous contrast administration. CONTRAST:  27mL OMNIPAQUE IOHEXOL 350 MG/ML SOLN COMPARISON:  05/17/2000 FINDINGS: Cardiovascular: The heart is within normal limits in size and stable. No pericardial effusion. The aorta is normal in caliber. Scattered atherosclerotic calcifications. Stable age advanced coronary artery calcifications. Mediastinum/Nodes: Stable scattered mediastinal and hilar lymph nodes. Most of these measure less than 8 mm. The prevascular node on image 44/2 measures 9 mm and is stable. The esophagus is grossly normal. Lungs/Pleura: Slight interval increase and soft tissue density in the right infrahilar/ azygoesophageal recess region. This is located just inferior to the cut off the right lower lobe pulmonary artery and adjacent to the right lower lobe bronchus. It measures approximately 16 mm in diameter and previously measured 10.5 mm. Findings worrisome recurrent tumor. PET-CT suggested for further evaluation. Stable severe underlying emphysematous changes and pulmonary scarring. 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 No new pulmonary nodules to suggest pulmonary metastatic disease. No pleural effusions or pleural nodules. Upper Abdomen: No significant upper abdominal findings. Stable bilateral adrenal gland adenomas. Musculoskeletal: No significant bony findings. IMPRESSION: 1. Slight interval increase in soft tissue density in the right infrahilar/ azygoesophageal recess region worrisome for recurrent tumor. PET-CT suggested for further evaluation. 2. 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 3. Stable scattered mediastinal and hilar lymph nodes.  4. Chronically occluded/cut off right lower lobe pulmonary artery. 5. Stable severe emphysematous changes and pulmonary scarring. 6. Stable age advanced coronary artery calcifications. 7. Stable bilateral adrenal gland adenomas. 8. Emphysema and aortic atherosclerosis. Aortic Atherosclerosis (ICD10-I70.0) and Emphysema (ICD10-J43.9). Electronically Signed   By: Marijo Sanes M.D.   On: 08/20/2020 10:52     ASSESSMENT AND PLAN: This is a very pleasant 65 years old white male with a stage IIIc non-small cell lung cancer, adenocarcinoma diagnosed in December 2020 The patient completed a course of concurrent chemoradiation with weekly carboplatin and paclitaxel status post 6 cycles and he tolerated his treatment well and has partial response. He underwent consolidation treatment with immunotherapy with Imfinzi 1500 mg IV every 4 weeks status post 13 cycles. The patient is currently on observation and he is feeling fine except for back pain and spasm followed by chiropractor.  He also has mild cough and requesting refill of Hycodan. He had repeat CT scan of the chest performed yesterday.  The scan showed no concerning finding for disease progression except for a slight increase in soft tissue density in the right infrahilar/azygos esophageal recess concerning for recurrent tumor. The patient left before I have the scan results and I will discuss with him either proceeding with a PET scan or repeating CT scan of the chest in 2-3 months for reevaluation of this area. He was advised to call immediately if he has any other concerning symptoms in the interval. For the pulmonary embolism diagnosed in February 2022, he will continue his current treatment with Xarelto.  The patient voices understanding of current disease status and treatment options and is in agreement with the current care plan. All questions were answered. The patient knows to call the clinic with any problems, questions or concerns. We can  certainly see the patient much sooner if necessary.  Disclaimer: This note was dictated with voice recognition software. Similar sounding words can inadvertently be transcribed and may not be corrected upon review.

## 2020-09-02 ENCOUNTER — Telehealth: Payer: Self-pay

## 2020-09-02 NOTE — Telephone Encounter (Signed)
Pt called to state he tested positive for COVID on 7/28 and wants to know where he can get Paxlovid. I have advised the pt to contact his PCP or an UC to schedule a phone visit. Pt states he has already LM for his PCP and awaiting a call back.  While I was speaking to the pt I also reminded him to schedule his CT scan as he has the lab for it on 10/25/20. I provided the pt with the contact number to radiology scheduling. Pt expressed understanding of this information.

## 2020-10-18 ENCOUNTER — Other Ambulatory Visit: Payer: No Typology Code available for payment source

## 2020-10-21 ENCOUNTER — Ambulatory Visit: Payer: No Typology Code available for payment source | Admitting: Internal Medicine

## 2020-10-25 ENCOUNTER — Telehealth: Payer: Self-pay | Admitting: Medical Oncology

## 2020-10-25 ENCOUNTER — Encounter (HOSPITAL_COMMUNITY): Payer: Self-pay

## 2020-10-25 ENCOUNTER — Ambulatory Visit (HOSPITAL_COMMUNITY)
Admission: RE | Admit: 2020-10-25 | Discharge: 2020-10-25 | Disposition: A | Discharge status: Home / Self Care | Payer: No Typology Code available for payment source | Source: Ambulatory Visit | Attending: Internal Medicine | Admitting: Internal Medicine

## 2020-10-25 ENCOUNTER — Inpatient Hospital Stay: Payer: No Typology Code available for payment source | Attending: Internal Medicine

## 2020-10-25 ENCOUNTER — Other Ambulatory Visit: Payer: Self-pay

## 2020-10-25 DIAGNOSIS — Z7901 Long term (current) use of anticoagulants: Secondary | ICD-10-CM | POA: Insufficient documentation

## 2020-10-25 DIAGNOSIS — R918 Other nonspecific abnormal finding of lung field: Secondary | ICD-10-CM | POA: Insufficient documentation

## 2020-10-25 DIAGNOSIS — C3490 Malignant neoplasm of unspecified part of unspecified bronchus or lung: Secondary | ICD-10-CM

## 2020-10-25 DIAGNOSIS — Z923 Personal history of irradiation: Secondary | ICD-10-CM | POA: Insufficient documentation

## 2020-10-25 DIAGNOSIS — Z86711 Personal history of pulmonary embolism: Secondary | ICD-10-CM | POA: Insufficient documentation

## 2020-10-25 DIAGNOSIS — Z9221 Personal history of antineoplastic chemotherapy: Secondary | ICD-10-CM | POA: Insufficient documentation

## 2020-10-25 DIAGNOSIS — R058 Other specified cough: Secondary | ICD-10-CM | POA: Insufficient documentation

## 2020-10-25 DIAGNOSIS — C3431 Malignant neoplasm of lower lobe, right bronchus or lung: Secondary | ICD-10-CM | POA: Insufficient documentation

## 2020-10-25 LAB — CMP (CANCER CENTER ONLY)
ALT: 19 U/L (ref 0–44)
AST: 18 U/L (ref 15–41)
Albumin: 3.8 g/dL (ref 3.5–5.0)
Alkaline Phosphatase: 84 U/L (ref 38–126)
Anion gap: 8 (ref 5–15)
BUN: 13 mg/dL (ref 8–23)
CO2: 24 mmol/L (ref 22–32)
Calcium: 9.3 mg/dL (ref 8.9–10.3)
Chloride: 107 mmol/L (ref 98–111)
Creatinine: 0.92 mg/dL (ref 0.61–1.24)
GFR, Estimated: >60 mL/min (ref >60)
Glucose, Bld: 100 mg/dL — ABNORMAL HIGH (ref 70–99)
Potassium: 4.6 mmol/L (ref 3.5–5.1)
Sodium: 139 mmol/L (ref 135–145)
Total Bilirubin: 0.5 mg/dL (ref 0.3–1.2)
Total Protein: 7.1 g/dL (ref 6.5–8.1)

## 2020-10-25 LAB — CBC WITH DIFFERENTIAL (CANCER CENTER ONLY)
Abs Immature Granulocytes: 0.03 10*3/uL (ref 0.00–0.07)
Basophils Absolute: 0 10*3/uL (ref 0.0–0.1)
Basophils Relative: 1 %
Eosinophils Absolute: 0.1 10*3/uL (ref 0.0–0.5)
Eosinophils Relative: 3 %
HCT: 45.9 % (ref 39.0–52.0)
Hemoglobin: 15.9 g/dL (ref 13.0–17.0)
Immature Granulocytes: 1 %
Lymphocytes Relative: 18 %
Lymphs Abs: 0.9 10*3/uL (ref 0.7–4.0)
MCH: 32.3 pg (ref 26.0–34.0)
MCHC: 34.6 g/dL (ref 30.0–36.0)
MCV: 93.1 fL (ref 80.0–100.0)
Monocytes Absolute: 0.6 10*3/uL (ref 0.1–1.0)
Monocytes Relative: 11 %
Neutro Abs: 3.3 10*3/uL (ref 1.7–7.7)
Neutrophils Relative %: 66 %
Platelet Count: 200 10*3/uL (ref 150–400)
RBC: 4.93 MIL/uL (ref 4.22–5.81)
RDW: 13.1 % (ref 11.5–15.5)
WBC Count: 5 10*3/uL (ref 4.0–10.5)
nRBC: 0 % (ref 0.0–0.2)

## 2020-10-25 MED ORDER — IOHEXOL 350 MG/ML SOLN
75.0000 mL | Freq: Once | INTRAVENOUS | Status: AC | PRN
  Administered 2020-10-25: 60 mL via INTRAVENOUS

## 2020-10-25 NOTE — Telephone Encounter (Signed)
Asking for CT results -  I LVM telling pt the results should be ready by Monday.

## 2020-10-28 ENCOUNTER — Inpatient Hospital Stay (HOSPITAL_BASED_OUTPATIENT_CLINIC_OR_DEPARTMENT_OTHER): Payer: No Typology Code available for payment source | Admitting: Internal Medicine

## 2020-10-28 ENCOUNTER — Other Ambulatory Visit: Payer: Self-pay

## 2020-10-28 VITALS — BP 113/87 | HR 88 | Temp 96.5°F | Resp 18 | Wt 188.2 lb

## 2020-10-28 DIAGNOSIS — C3490 Malignant neoplasm of unspecified part of unspecified bronchus or lung: Secondary | ICD-10-CM | POA: Diagnosis not present

## 2020-10-28 DIAGNOSIS — Z86711 Personal history of pulmonary embolism: Secondary | ICD-10-CM | POA: Diagnosis not present

## 2020-10-28 DIAGNOSIS — C3431 Malignant neoplasm of lower lobe, right bronchus or lung: Secondary | ICD-10-CM | POA: Diagnosis not present

## 2020-10-28 DIAGNOSIS — C3491 Malignant neoplasm of unspecified part of right bronchus or lung: Secondary | ICD-10-CM | POA: Diagnosis not present

## 2020-10-28 DIAGNOSIS — Z7901 Long term (current) use of anticoagulants: Secondary | ICD-10-CM | POA: Diagnosis not present

## 2020-10-28 DIAGNOSIS — R058 Other specified cough: Secondary | ICD-10-CM | POA: Diagnosis not present

## 2020-10-28 DIAGNOSIS — Z9221 Personal history of antineoplastic chemotherapy: Secondary | ICD-10-CM | POA: Diagnosis not present

## 2020-10-28 DIAGNOSIS — Z923 Personal history of irradiation: Secondary | ICD-10-CM | POA: Diagnosis not present

## 2020-10-28 DIAGNOSIS — R918 Other nonspecific abnormal finding of lung field: Secondary | ICD-10-CM | POA: Diagnosis not present

## 2020-10-28 NOTE — Progress Notes (Signed)
[place] Telephone:([phone]   Fax:(336) [phone]  OFFICE PROGRESS NOTE  [name], MD [address] [place] [address]  DIAGNOSIS:  1) Stage IIIc (T4, N3, M0) non-small cell lung cancer, adenocarcinoma presented with large right hilar mass in addition to bilateral hilar and mediastinal lymphadenopathy diagnosed in December 2020. 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   PRIOR THERAPY:  1) Weekly concurrent chemoradiation with carboplatin for an AUC of 2 and paclitaxel 45 mg/m2. First dose starting 01/30/2019.   Status post 6 cycles. 2) Consolidation immunotherapy with Imfinzi 1500 mg IV every 4 weeks. First dose expected on 04/19/2019.  Status post 13 cycles.   CURRENT THERAPY:  Xarelto 15 mg p.o. twice daily for 3 weeks followed by 20 mg p.o. daily.  First dose was given on March 17 2020.  INTERVAL HISTORY: Philip [name] 65 y.o. male returns to the clinic today for follow-up visit.  The patient is feeling fine today with no concerning complaints.  He denied having any chest pain, shortness of breath except with exertion and he continues to have mild cough and requesting refill of hydrocodone.  He denied having any recent weight loss or night sweats.  He has no nausea, vomiting, diarrhea or constipation.  He has no headache or visual changes.  He also has a lot of muscle and back pain and he is currently followed by chiropractor.  He had repeat CT scan of the chest performed yesterday and the patient is here today for evaluation and discussion of his scan results.   MEDICAL HISTORY: Past Medical History:  Diagnosis Date   Hyperlipidemia    lung ca dx'd 12/2018   Pneumonia    Pulmonary embolus (HCC)    Tobacco abuse     ALLERGIES:  is allergic to norco [hydrocodone-acetaminophen].  MEDICATIONS:  Current Outpatient  Medications  Medication Sig Dispense Refill   albuterol (VENTOLIN HFA) 108 (90 Base) MCG/ACT inhaler Inhale 2 puffs into the lungs every 4 (four) hours as needed for wheezing or shortness of breath. (Patient not taking: Reported on 08/20/2020) 18 g 6   atorvastatin (LIPITOR) 20 MG tablet Take 20 mg by mouth daily.     dextromethorphan-guaiFENesin (MUCINEX DM) 30-600 MG 12hr tablet Take 1 tablet by mouth 2 (two) times daily as needed for cough. (Patient not taking: Reported on 08/20/2020) 40 tablet 0   famotidine (PEPCID) 20 MG tablet TAKE 1 TABLET BY MOUTH TWICE A DAY 180 tablet 1   fexofenadine (ALLEGRA) 180 MG tablet Take 180 mg by mouth daily.     HYDROcodone-acetaminophen (NORCO/VICODIN) 5-325 MG tablet Take 1 tablet by mouth every 4 (four) hours as needed for moderate pain. 15 tablet 0   Nebulizers (COMPRESSOR/NEBULIZER) MISC 1 application by Does not apply route every 4 (four) hours as needed. (Patient not taking: Reported on 08/20/2020) 1 each 0   RIVAROXABAN (XARELTO) VTE STARTER PACK (15 & 20 MG) Follow package directions: Take one 15mg  tablet by mouth twice a day. On day 22, switch to one 20mg  tablet once a day. Take with food. 51 each 0   SPIRIVA RESPIMAT 1.25 MCG/ACT AERS Inhale 2 puffs into the lungs daily.     Tiotropium Bromide-Olodaterol (STIOLTO RESPIMAT) 2.5-2.5 MCG/ACT AERS Inhale 2 puffs into the lungs daily. (Patient not taking: No sig reported) 3 each 3   No current facility-administered medications  for this visit.    SURGICAL HISTORY:  Past Surgical History:  Procedure Laterality Date   COLONOSCOPY     HAND SURGERY     VIDEO BRONCHOSCOPY WITH ENDOBRONCHIAL ULTRASOUND N/A 12/22/2018   Procedure: VIDEO BRONCHOSCOPY WITH ENDOBRONCHIAL ULTRASOUND;  Surgeon: Garner Nash, DO;  Location: [place];  Service: Thoracic;  Laterality: N/A;   VIDEO BRONCHOSCOPY WITH ENDOBRONCHIAL ULTRASOUND N/A 01/09/2019   Procedure: VIDEO BRONCHOSCOPY WITH ENDOBRONCHIAL ULTRASOUND WITH FLUORO;   Surgeon: Garner Nash, DO;  Location: [place];  Service: Thoracic;  Laterality: N/A;   VIDEO BRONCHOSCOPY WITH RADIAL ENDOBRONCHIAL ULTRASOUND N/A 12/22/2018   Procedure: RADIAL ENDOBRONCHIAL ULTRASOUND;  Surgeon: Garner Nash, DO;  Location: [place];  Service: Thoracic;  Laterality: N/A;    REVIEW OF SYSTEMS:  A comprehensive review of systems was negative except for: Respiratory: positive for cough Musculoskeletal: positive for back pain   PHYSICAL EXAMINATION: General appearance: alert, cooperative, fatigued, and no distress Head: Normocephalic, without obvious abnormality, atraumatic Neck: no adenopathy, no JVD, supple, symmetrical, trachea midline, and thyroid not enlarged, symmetric, no tenderness/mass/nodules Lymph nodes: Cervical, supraclavicular, and axillary nodes normal. Resp: clear to auscultation bilaterally Back: symmetric, no curvature. ROM normal. No CVA tenderness. Cardio: regular rate and rhythm, S1, S2 normal, no murmur, click, rub or gallop GI: soft, non-tender; bowel sounds normal; no masses,  no organomegaly Extremities: extremities normal, atraumatic, no cyanosis or edema  ECOG PERFORMANCE STATUS: 1 - Symptomatic but completely ambulatory  Blood pressure 113/87, pulse 88, temperature (!) 96.5 F (35.8 C), temperature source Tympanic, resp. rate 18, weight 188 lb 4 oz (85.4 kg), SpO2 98 %.  LABORATORY DATA: Lab Results  Component Value Date   WBC 5.0 10/25/2020   HGB 15.9 10/25/2020   HCT 45.9 10/25/2020   MCV 93.1 10/25/2020   PLT 200 10/25/2020      Chemistry      Component Value Date/Time   NA 139 10/25/2020 0902   K 4.6 10/25/2020 0902   CL 107 10/25/2020 0902   CO2 24 10/25/2020 0902   BUN 13 10/25/2020 0902   CREATININE 0.92 10/25/2020 0902      Component Value Date/Time   CALCIUM 9.3 10/25/2020 0902   ALKPHOS 84 10/25/2020 0902   AST 18 10/25/2020 0902   ALT 19 10/25/2020 0902   BILITOT 0.5 10/25/2020 0902       RADIOGRAPHIC  STUDIES: CT Chest W Contrast  Result Date: 10/28/2020 CLINICAL DATA:  Non-small-cell lung cancer staging EXAM: CT CHEST WITH CONTRAST TECHNIQUE: Multidetector CT imaging of the chest was performed during intravenous contrast administration. CONTRAST:  97mL OMNIPAQUE IOHEXOL 350 MG/ML SOLN COMPARISON:  Multiple priors, most recent CT chest dated August 19, 2020 FINDINGS: Cardiovascular: Normal heart size. No pericardial effusion. Calcifications of the LAD and RCA. Atherosclerotic changes of the thoracic aorta. Mediastinum/Nodes: Unchanged soft tissue of the right azygoesophageal which is likely an enlarged right hilar lymph node, measures 1.3 cm in short axis. No enlarged mediastinal lymph nodes. Small hiatal hernia. Thyroid is unremarkable. Lungs/Pleura: Central airways are patent. Right paramediastinal postradiation changes with chronically occluded right lower lobe pulmonary artery. Soft tissue nodule in the right middle lobe adjacent to postradiation changes continues to increase in size, measures 1.8 x 1.1 cm on series 7, image 76, previously 1.3 x 1.0 cm on prior exam. Upper lobe predominant centrilobular emphysema. Upper Abdomen: Stable adrenal gland adenomas.  No acute findings. Musculoskeletal: No chest wall abnormality. No acute or significant osseous findings. IMPRESSION: Soft tissue nodule  in the right middle lobe adjacent to post radiation changes continues to increase in size and is highly concerning for recurrent disease. Could be further evaluated with PET-CT. Soft tissue of the right hilum/azygoesophageal recess is unchanged in size compared to most recent prior, but increased when compared with more remote priors and likely an enlarged hilar lymph node. Aortic Atherosclerosis (ICD10-I70.0) and Emphysema (ICD10-J43.9). Electronically Signed   By: Yetta Glassman M.D.   On: 10/28/2020 08:51     ASSESSMENT AND PLAN: This is a very pleasant 65 years old white male with a stage IIIc non-small cell  lung cancer, adenocarcinoma diagnosed in December 2020 The patient completed a course of concurrent chemoradiation with weekly carboplatin and paclitaxel status post 6 cycles and he tolerated his treatment well and has partial response. He underwent consolidation treatment with immunotherapy with Imfinzi 1500 mg IV every 4 weeks status post 13 cycles. The patient is currently on observation and he is feeling fine except for back pain and spasm followed by chiropractor.  He also has mild cough and requesting refill of Hycodan. He had repeat CT scan of the chest performed yesterday.  The scan showed no concerning finding for disease progression except for a slight increase in soft tissue density in the right infrahilar/azygos esophageal recess concerning for recurrent tumor. The patient left before I have the scan results and I will discuss with him either proceeding with a PET scan or repeating CT scan of the chest in 2-3 months for reevaluation of this area. He was advised to call immediately if he has any other concerning symptoms in the interval. For the pulmonary embolism diagnosed in February 2022, he will continue his current treatment with Xarelto.  The patient voices understanding of current disease status and treatment options and is in agreement with the current care plan. All questions were answered. The patient knows to call the clinic with any problems, questions or concerns. We can certainly see the patient much sooner if necessary.  Disclaimer: This note was dictated with voice recognition software. Similar sounding words can inadvertently be transcribed and may not be corrected upon review.

## 2020-10-28 NOTE — Progress Notes (Signed)
[place] [phone]) [phone]   [phone]  OFFICE PROGRESS NOTE  [name], MD [address] [place] [address]  DIAGNOSIS:  1) Stage IIIc (T4, N3, M0) non-small cell lung cancer, adenocarcinoma presented with large right hilar mass in addition to bilateral hilar and mediastinal lymphadenopathy diagnosed in December 2020. 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   PRIOR THERAPY:  1) Weekly concurrent chemoradiation with carboplatin for an AUC of 2 and paclitaxel 45 mg/m2. First dose starting 01/30/2019.   Status post 6 cycles. 2) Consolidation immunotherapy with Imfinzi 1500 mg IV every 4 weeks. First dose expected on 04/19/2019.  Status post 13 cycles.   CURRENT THERAPY:  Xarelto 15 mg p.o. twice daily for 3 weeks followed by 20 mg p.o. daily.  First dose was given on March 17 2020.  INTERVAL HISTORY: Philip [name] 65 y.o. male returns to the clinic today for follow-up visit accompanied by his wife.  The patient is feeling fine today with no concerning complaints except for mild cough productive of whitish sputum.  He denied having any chest pain, shortness of breath or hemoptysis.  He continues to complain of left shoulder pain.  He has no nausea, vomiting, diarrhea or constipation.  He denied having any headache or visual changes.  He has no weight loss or night sweats.  He is here today for evaluation with repeat CT scan of the chest for restaging of his disease.   MEDICAL HISTORY: Past Medical History:  Diagnosis Date   Hyperlipidemia    lung ca dx'd 12/2018   Pneumonia    Pulmonary embolus (HCC)    Tobacco abuse     ALLERGIES:  is allergic to norco [hydrocodone-acetaminophen].  MEDICATIONS:  Current Outpatient Medications  Medication Sig Dispense Refill   albuterol (VENTOLIN HFA) 108 (90 Base) MCG/ACT inhaler  Inhale 2 puffs into the lungs every 4 (four) hours as needed for wheezing or shortness of breath. (Patient not taking: Reported on 08/20/2020) 18 g 6   atorvastatin (LIPITOR) 20 MG tablet Take 20 mg by mouth daily.     dextromethorphan-guaiFENesin (MUCINEX DM) 30-600 MG 12hr tablet Take 1 tablet by mouth 2 (two) times daily as needed for cough. (Patient not taking: Reported on 08/20/2020) 40 tablet 0   famotidine (PEPCID) 20 MG tablet TAKE 1 TABLET BY MOUTH TWICE A DAY 180 tablet 1   fexofenadine (ALLEGRA) 180 MG tablet Take 180 mg by mouth daily.     HYDROcodone-acetaminophen (NORCO/VICODIN) 5-325 MG tablet Take 1 tablet by mouth every 4 (four) hours as needed for moderate pain. 15 tablet 0   Nebulizers (COMPRESSOR/NEBULIZER) MISC 1 application by Does not apply route every 4 (four) hours as needed. (Patient not taking: Reported on 08/20/2020) 1 each 0   RIVAROXABAN (XARELTO) VTE STARTER PACK (15 & 20 MG) Follow package directions: Take one 15mg  tablet by mouth twice a day. On day 22, switch to one 20mg  tablet once a day. Take with food. 51 each 0   SPIRIVA RESPIMAT 1.25 MCG/ACT AERS Inhale 2 puffs into the lungs daily.     Tiotropium Bromide-Olodaterol (STIOLTO RESPIMAT) 2.5-2.5 MCG/ACT AERS Inhale 2 puffs into the lungs daily. (Patient not taking: No sig reported) 3 each 3   No current facility-administered medications for this visit.    SURGICAL HISTORY:  Past Surgical History:  Procedure Laterality Date  COLONOSCOPY     HAND SURGERY     VIDEO BRONCHOSCOPY WITH ENDOBRONCHIAL ULTRASOUND N/A 12/22/2018   Procedure: VIDEO BRONCHOSCOPY WITH ENDOBRONCHIAL ULTRASOUND;  Surgeon: Garner Nash, DO;  Location: [facility];  Service: Thoracic;  Laterality: N/A;   VIDEO BRONCHOSCOPY WITH ENDOBRONCHIAL ULTRASOUND N/A 01/09/2019   Procedure: VIDEO BRONCHOSCOPY WITH ENDOBRONCHIAL ULTRASOUND WITH FLUORO;  Surgeon: Garner Nash, DO;  Location: [facility];  Service: Thoracic;  Laterality: N/A;   VIDEO  BRONCHOSCOPY WITH RADIAL ENDOBRONCHIAL ULTRASOUND N/A 12/22/2018   Procedure: RADIAL ENDOBRONCHIAL ULTRASOUND;  Surgeon: Garner Nash, DO;  Location: [facility];  Service: Thoracic;  Laterality: N/A;    REVIEW OF SYSTEMS:  A comprehensive review of systems was negative except for: Respiratory: positive for cough Musculoskeletal: positive for back pain   PHYSICAL EXAMINATION: General appearance: alert, cooperative, and no distress Head: Normocephalic, without obvious abnormality, atraumatic Neck: no adenopathy, no JVD, supple, symmetrical, trachea midline, and thyroid not enlarged, symmetric, no tenderness/mass/nodules Lymph nodes: Cervical, supraclavicular, and axillary nodes normal. Resp: clear to auscultation bilaterally Back: symmetric, no curvature. ROM normal. No CVA tenderness. Cardio: regular rate and rhythm, S1, S2 normal, no murmur, click, rub or gallop GI: soft, non-tender; bowel sounds normal; no masses,  no organomegaly Extremities: extremities normal, atraumatic, no cyanosis or edema  ECOG PERFORMANCE STATUS: 1 - Symptomatic but completely ambulatory  Blood pressure 113/87, pulse 88, temperature (!) 96.5 F (35.8 C), temperature source Tympanic, resp. rate 18, weight 188 lb 4 oz (85.4 kg), SpO2 98 %.  LABORATORY DATA: Lab Results  Component Value Date   WBC 5.0 10/25/2020   HGB 15.9 10/25/2020   HCT 45.9 10/25/2020   MCV 93.1 10/25/2020   PLT 200 10/25/2020      Chemistry      Component Value Date/Time   NA 139 10/25/2020 0902   K 4.6 10/25/2020 0902   CL 107 10/25/2020 0902   CO2 24 10/25/2020 0902   BUN 13 10/25/2020 0902   CREATININE 0.92 10/25/2020 0902      Component Value Date/Time   CALCIUM 9.3 10/25/2020 0902   ALKPHOS 84 10/25/2020 0902   AST 18 10/25/2020 0902   ALT 19 10/25/2020 0902   BILITOT 0.5 10/25/2020 0902       RADIOGRAPHIC STUDIES: CT Chest W Contrast  Result Date: 10/28/2020 CLINICAL DATA:  Non-small-cell lung cancer staging  EXAM: CT CHEST WITH CONTRAST TECHNIQUE: Multidetector CT imaging of the chest was performed during intravenous contrast administration. CONTRAST:  108mL OMNIPAQUE IOHEXOL 350 MG/ML SOLN COMPARISON:  Multiple priors, most recent CT chest dated August 19, 2020 FINDINGS: Cardiovascular: Normal heart size. No pericardial effusion. Calcifications of the LAD and RCA. Atherosclerotic changes of the thoracic aorta. Mediastinum/Nodes: Unchanged soft tissue of the right azygoesophageal which is likely an enlarged right hilar lymph node, measures 1.3 cm in short axis. No enlarged mediastinal lymph nodes. Small hiatal hernia. Thyroid is unremarkable. Lungs/Pleura: Central airways are patent. Right paramediastinal postradiation changes with chronically occluded right lower lobe pulmonary artery. Soft tissue nodule in the right middle lobe adjacent to postradiation changes continues to increase in size, measures 1.8 x 1.1 cm on series 7, image 76, previously 1.3 x 1.0 cm on prior exam. Upper lobe predominant centrilobular emphysema. Upper Abdomen: Stable adrenal gland adenomas.  No acute findings. Musculoskeletal: No chest wall abnormality. No acute or significant osseous findings. IMPRESSION: Soft tissue nodule in the right middle lobe adjacent to post radiation changes continues to increase in size and is highly concerning  for recurrent disease. Could be further evaluated with PET-CT. Soft tissue of the right hilum/azygoesophageal recess is unchanged in size compared to most recent prior, but increased when compared with more remote priors and likely an enlarged hilar lymph node. Aortic Atherosclerosis (ICD10-I70.0) and Emphysema (ICD10-J43.9). Electronically Signed   By: Yetta Glassman M.D.   On: 10/28/2020 08:51     ASSESSMENT AND PLAN: This is a very pleasant 65 years old white male with a stage IIIc non-small cell lung cancer, adenocarcinoma diagnosed in December 2020 The patient completed a course of concurrent  chemoradiation with weekly carboplatin and paclitaxel status post 6 cycles and he tolerated his treatment well and has partial response. He underwent consolidation treatment with immunotherapy with Imfinzi 1500 mg IV every 4 weeks status post 13 cycles. Is currently on observation and feeling fine today with no concerning complaints except for mild cough as well as the left shoulder pain. He had repeat CT scan of the chest performed recently.  I personally and independently reviewed the scan images and discussed the result and showed the images to the patient and his wife. His scan showed increase in the size of the soft tissue nodule in the right middle lobe adjacent to the postradiation changes highly suspicious for recurrent disease.  There was also soft tissue of the right hilum/as ago esophageal recess unchanged at but increased compared to remote prior studies. I recommended for the patient to have a PET scan for further evaluation of his disease and to rule out any disease recurrence or progression. He is going to the beach for 2 weeks and will not be able to come back until 3 weeks or so from now.  I will see him for follow-up visit after the PET scan at that time. For the pulmonary embolism diagnosed in February 2022, he will continue his current treatment with Xarelto. The patient was advised to call immediately if he has any other concerning symptoms in the interval. The patient voices understanding of current disease status and treatment options and is in agreement with the current care plan. All questions were answered. The patient knows to call the clinic with any problems, questions or concerns. We can certainly see the patient much sooner if necessary.  Disclaimer: This note was dictated with voice recognition software. Similar sounding words can inadvertently be transcribed and may not be corrected upon review.

## 2020-11-27 ENCOUNTER — Ambulatory Visit (HOSPITAL_COMMUNITY)
Admission: RE | Admit: 2020-11-27 | Discharge: 2020-11-27 | Disposition: A | Discharge status: Home / Self Care | Payer: No Typology Code available for payment source | Source: Ambulatory Visit | Attending: Internal Medicine | Admitting: Internal Medicine

## 2020-11-27 DIAGNOSIS — C3490 Malignant neoplasm of unspecified part of unspecified bronchus or lung: Secondary | ICD-10-CM | POA: Insufficient documentation

## 2020-11-27 LAB — GLUCOSE, CAPILLARY: Glucose-Capillary: 114 mg/dL — ABNORMAL HIGH (ref 70–99)

## 2020-11-27 MED ORDER — FLUDEOXYGLUCOSE F - 18 (FDG) INJECTION
9.4000 | Freq: Once | INTRAVENOUS | Status: AC
  Administered 2020-11-27: 9.3 via INTRAVENOUS

## 2020-12-20 ENCOUNTER — Inpatient Hospital Stay: Payer: No Typology Code available for payment source | Attending: Internal Medicine | Admitting: Internal Medicine

## 2020-12-20 ENCOUNTER — Other Ambulatory Visit: Payer: Self-pay

## 2020-12-20 ENCOUNTER — Inpatient Hospital Stay: Payer: No Typology Code available for payment source

## 2020-12-20 VITALS — BP 112/72 | HR 86 | Temp 97.7°F | Resp 17 | Wt 191.1 lb

## 2020-12-20 DIAGNOSIS — Z7901 Long term (current) use of anticoagulants: Secondary | ICD-10-CM | POA: Diagnosis not present

## 2020-12-20 DIAGNOSIS — R0609 Other forms of dyspnea: Secondary | ICD-10-CM | POA: Diagnosis not present

## 2020-12-20 DIAGNOSIS — E785 Hyperlipidemia, unspecified: Secondary | ICD-10-CM | POA: Diagnosis not present

## 2020-12-20 DIAGNOSIS — R5383 Other fatigue: Secondary | ICD-10-CM | POA: Insufficient documentation

## 2020-12-20 DIAGNOSIS — C3431 Malignant neoplasm of lower lobe, right bronchus or lung: Secondary | ICD-10-CM | POA: Insufficient documentation

## 2020-12-20 DIAGNOSIS — E538 Deficiency of other specified B group vitamins: Secondary | ICD-10-CM | POA: Insufficient documentation

## 2020-12-20 DIAGNOSIS — Z5112 Encounter for antineoplastic immunotherapy: Secondary | ICD-10-CM

## 2020-12-20 DIAGNOSIS — Z79899 Other long term (current) drug therapy: Secondary | ICD-10-CM | POA: Diagnosis not present

## 2020-12-20 DIAGNOSIS — Z9221 Personal history of antineoplastic chemotherapy: Secondary | ICD-10-CM | POA: Insufficient documentation

## 2020-12-20 DIAGNOSIS — F17210 Nicotine dependence, cigarettes, uncomplicated: Secondary | ICD-10-CM | POA: Insufficient documentation

## 2020-12-20 DIAGNOSIS — R059 Cough, unspecified: Secondary | ICD-10-CM | POA: Insufficient documentation

## 2020-12-20 DIAGNOSIS — Z86711 Personal history of pulmonary embolism: Secondary | ICD-10-CM | POA: Insufficient documentation

## 2020-12-20 DIAGNOSIS — C3491 Malignant neoplasm of unspecified part of right bronchus or lung: Secondary | ICD-10-CM

## 2020-12-20 DIAGNOSIS — Z5111 Encounter for antineoplastic chemotherapy: Secondary | ICD-10-CM | POA: Diagnosis present

## 2020-12-20 DIAGNOSIS — Z923 Personal history of irradiation: Secondary | ICD-10-CM | POA: Insufficient documentation

## 2020-12-20 LAB — CMP (CANCER CENTER ONLY)
ALT: 21 U/L (ref 0–44)
AST: 22 U/L (ref 15–41)
Albumin: 3.7 g/dL (ref 3.5–5.0)
Alkaline Phosphatase: 85 U/L (ref 38–126)
Anion gap: 7 (ref 5–15)
BUN: 14 mg/dL (ref 8–23)
CO2: 25 mmol/L (ref 22–32)
Calcium: 9.1 mg/dL (ref 8.9–10.3)
Chloride: 109 mmol/L (ref 98–111)
Creatinine: 0.95 mg/dL (ref 0.61–1.24)
GFR, Estimated: >60 mL/min (ref >60)
Glucose, Bld: 81 mg/dL (ref 70–99)
Potassium: 4.6 mmol/L (ref 3.5–5.1)
Sodium: 141 mmol/L (ref 135–145)
Total Bilirubin: 0.4 mg/dL (ref 0.3–1.2)
Total Protein: 6.9 g/dL (ref 6.5–8.1)

## 2020-12-20 LAB — CBC WITH DIFFERENTIAL (CANCER CENTER ONLY)
Abs Immature Granulocytes: 0.02 10*3/uL (ref 0.00–0.07)
Basophils Absolute: 0.1 10*3/uL (ref 0.0–0.1)
Basophils Relative: 1 %
Eosinophils Absolute: 0.2 10*3/uL (ref 0.0–0.5)
Eosinophils Relative: 3 %
HCT: 44.5 % (ref 39.0–52.0)
Hemoglobin: 15.2 g/dL (ref 13.0–17.0)
Immature Granulocytes: 0 %
Lymphocytes Relative: 17 %
Lymphs Abs: 1 10*3/uL (ref 0.7–4.0)
MCH: 31.9 pg (ref 26.0–34.0)
MCHC: 34.2 g/dL (ref 30.0–36.0)
MCV: 93.3 fL (ref 80.0–100.0)
Monocytes Absolute: 0.6 10*3/uL (ref 0.1–1.0)
Monocytes Relative: 10 %
Neutro Abs: 4.2 10*3/uL (ref 1.7–7.7)
Neutrophils Relative %: 69 %
Platelet Count: 214 10*3/uL (ref 150–400)
RBC: 4.77 MIL/uL (ref 4.22–5.81)
RDW: 12.6 % (ref 11.5–15.5)
WBC Count: 6.1 10*3/uL (ref 4.0–10.5)
nRBC: 0 % (ref 0.0–0.2)

## 2020-12-20 LAB — TSH: TSH: 1.256 u[IU]/mL (ref 0.320–4.118)

## 2020-12-20 MED ORDER — PROCHLORPERAZINE MALEATE 10 MG PO TABS: 10.0000 mg | ORAL_TABLET | Freq: Four times a day (QID) | ORAL | 0 refills | Status: DC | PRN

## 2020-12-20 MED ORDER — FOLIC ACID 1 MG PO TABS
1.0000 mg | ORAL_TABLET | Freq: Every day | ORAL | 4 refills | Status: DC
Start: 2020-12-20 — End: 2021-03-17

## 2020-12-20 MED ORDER — CYANOCOBALAMIN 1000 MCG/ML IJ SOLN
1000.0000 ug | Freq: Once | INTRAMUSCULAR | Status: AC
  Administered 2020-12-20: 1000 ug via INTRAMUSCULAR
  Filled 2020-12-20: qty 1

## 2020-12-20 NOTE — Progress Notes (Signed)
DISCONTINUE ON PATHWAY REGIMEN - Non-Small Cell Lung     Administer weekly:     Paclitaxel      Carboplatin   **Always confirm dose/schedule in your pharmacy ordering system**  REASON: Disease Progression PRIOR TREATMENT: [id number]: Carboplatin AUC=2 + Paclitaxel 45 mg/m2 Weekly During Radiation TREATMENT RESPONSE: Stable Disease (SD)  START ON PATHWAY REGIMEN - Non-Small Cell Lung     A cycle is every 21 days:     Pembrolizumab      Pemetrexed      Carboplatin   **Always confirm dose/schedule in your pharmacy ordering system**  Patient Characteristics: Stage IV Metastatic, Nonsquamous, Molecular Analysis Completed, Molecular Alteration Present and Targeted Therapy Exhausted OR EGFR Exon 20+ or KRAS G12C+ or HER2+ Present and No Prior Chemo/Immunotherapy OR No Alteration Present, Initial  Chemotherapy/Immunotherapy, PS = 0, 1, No Alteration Present, No Alteration Present, Candidate for Immunotherapy, PD-L1 Expression Positive 1-49% (TPS) / Negative / Not Tested / Awaiting Test Results and Immunotherapy Candidate Therapeutic Status: Stage IV Metastatic Histology: Nonsquamous Cell Broad Molecular Profiling Status: Molecular Analysis Completed Molecular Analysis Results: No Alteration Present ECOG Performance Status: 1 Chemotherapy/Immunotherapy Line of Therapy: Initial Chemotherapy/Immunotherapy EGFR Exons 18-21 Mutation Testing Status: Completed and Negative ALK Fusion/Rearrangement Testing Status: Completed and Negative BRAF V600 Mutation Testing Status: Completed and Negative KRAS G12C Mutation Testing Status: Completed and Negative MET Exon 14 Mutation Testing Status: Completed and Negative RET Fusion/Rearrangement Testing Status: Completed and Negative HER2 Mutation Testing Status: Completed and Negative NTRK Fusion/Rearrangement Testing Status: Completed and Negative ROS1 Fusion/Rearrangement Testing Status: Completed and Negative Immunotherapy Candidate Status: Candidate  for Immunotherapy PD-L1 Expression Status: Quantity Not Sufficient Intent of Therapy: Non-Curative / Palliative Intent, Discussed with Patient

## 2020-12-20 NOTE — Progress Notes (Signed)
[place] [phone]   Fax:([phone]  OFFICE PROGRESS NOTE  [name], MD [address] [place] [address]  DIAGNOSIS:  1) recurrent non-small cell lung cancer initially diagnosed as stage IIIc (T4, N3, M0) non-small cell lung cancer, adenocarcinoma presented with large right hilar mass in addition to bilateral hilar and mediastinal lymphadenopathy diagnosed in December 2020.  The patient has evidence for disease recurrence in October 2022. 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  Started on Xarelto on March 17, 2020 and currently 20 mg p.o. daily.   PRIOR THERAPY:  1) Weekly concurrent chemoradiation with carboplatin for an AUC of 2 and paclitaxel 45 mg/m2. First dose starting 01/30/2019.   Status post 6 cycles. 2) Consolidation immunotherapy with Imfinzi 1500 mg IV every 4 weeks. First dose expected on 04/19/2019.  Status post 13 cycles. 3) disease recurrence in October 2022.   CURRENT THERAPY: Systemic chemotherapy with carboplatin for AUC of 5, Alimta 500 Mg/M2 and Keytruda 200 Mg IV every 3 weeks.  First dose January 01, 2021.   INTERVAL HISTORY: Philip [name] 65 y.o. male returns to the clinic today for follow-up visit accompanied by his wife.  The patient is feeling fine today with no concerning complaints except for mild fatigue.  He denied having any current chest pain but has shortness of breath with exertion and mild cough with no hemoptysis.  He denied having any fever or chills.  He has no nausea, vomiting, diarrhea or constipation.  He has no headache or visual changes.  He denied having any significant weight loss or night sweats.  He was found on previous CT scan of the chest to have suspicious disease recurrence with increase in soft tissue nodule in the right middle lobe as well as soft tissue of the right  hilum/azygoesophageal recess.  The patient had a PET scan performed recently and he is here for evaluation and discussion of his PET scan results and recommendation regarding treatment of his condition.  MEDICAL HISTORY: Past Medical History:  Diagnosis Date   Hyperlipidemia    lung ca dx'd 12/2018   Pneumonia    Pulmonary embolus (HCC)    Tobacco abuse     ALLERGIES:  is allergic to norco [hydrocodone-acetaminophen].  MEDICATIONS:  Current Outpatient Medications  Medication Sig Dispense Refill   albuterol (VENTOLIN HFA) 108 (90 Base) MCG/ACT inhaler Inhale 2 puffs into the lungs every 4 (four) hours as needed for wheezing or shortness of breath. (Patient not taking: Reported on 08/20/2020) 18 g 6   atorvastatin (LIPITOR) 20 MG tablet Take 20 mg by mouth daily.     dextromethorphan-guaiFENesin (MUCINEX DM) 30-600 MG 12hr tablet Take 1 tablet by mouth 2 (two) times daily as needed for cough. (Patient not taking: Reported on 08/20/2020) 40 tablet 0   famotidine (PEPCID) 20 MG tablet TAKE 1 TABLET BY MOUTH TWICE A DAY 180 tablet 1   fexofenadine (ALLEGRA) 180 MG tablet Take 180 mg by mouth daily.     HYDROcodone-acetaminophen (NORCO/VICODIN) 5-325 MG tablet Take 1 tablet by mouth every 4 (four) hours as needed for moderate pain. 15 tablet 0   Nebulizers (COMPRESSOR/NEBULIZER) MISC 1 application by Does not apply route every 4 (four) hours as needed. (Patient not taking: Reported on 08/20/2020) 1 each 0   RIVAROXABAN (XARELTO) VTE STARTER PACK (15 & 20 MG) Follow package  directions: Take one 15mg  tablet by mouth twice a day. On day 22, switch to one 20mg  tablet once a day. Take with food. 51 each 0   SPIRIVA RESPIMAT 1.25 MCG/ACT AERS Inhale 2 puffs into the lungs daily.     Tiotropium Bromide-Olodaterol (STIOLTO RESPIMAT) 2.5-2.5 MCG/ACT AERS Inhale 2 puffs into the lungs daily. (Patient not taking: No sig reported) 3 each 3   No current facility-administered medications for this visit.     SURGICAL HISTORY:  Past Surgical History:  Procedure Laterality Date   COLONOSCOPY     HAND SURGERY     VIDEO BRONCHOSCOPY WITH ENDOBRONCHIAL ULTRASOUND N/A 12/22/2018   Procedure: VIDEO BRONCHOSCOPY WITH ENDOBRONCHIAL ULTRASOUND;  Surgeon: Garner Nash, DO;  Location: [facility];  Service: Thoracic;  Laterality: N/A;   VIDEO BRONCHOSCOPY WITH ENDOBRONCHIAL ULTRASOUND N/A 01/09/2019   Procedure: VIDEO BRONCHOSCOPY WITH ENDOBRONCHIAL ULTRASOUND WITH FLUORO;  Surgeon: Garner Nash, DO;  Location: [facility];  Service: Thoracic;  Laterality: N/A;   VIDEO BRONCHOSCOPY WITH RADIAL ENDOBRONCHIAL ULTRASOUND N/A 12/22/2018   Procedure: RADIAL ENDOBRONCHIAL ULTRASOUND;  Surgeon: Garner Nash, DO;  Location: [facility];  Service: Thoracic;  Laterality: N/A;    REVIEW OF SYSTEMS:  Constitutional: positive for fatigue Eyes: negative Ears, nose, mouth, throat, and face: negative Respiratory: positive for dyspnea on exertion Cardiovascular: negative Gastrointestinal: negative Genitourinary:negative Integument/breast: negative Hematologic/lymphatic: negative Musculoskeletal:positive for arthralgias Neurological: negative Behavioral/Psych: negative Endocrine: negative Allergic/Immunologic: negative   PHYSICAL EXAMINATION: General appearance: alert, cooperative, fatigued, and no distress Head: Normocephalic, without obvious abnormality, atraumatic Neck: no adenopathy, no JVD, supple, symmetrical, trachea midline, and thyroid not enlarged, symmetric, no tenderness/mass/nodules Lymph nodes: Cervical, supraclavicular, and axillary nodes normal. Resp: clear to auscultation bilaterally Back: symmetric, no curvature. ROM normal. No CVA tenderness. Cardio: regular rate and rhythm, S1, S2 normal, no murmur, click, rub or gallop GI: soft, non-tender; bowel sounds normal; no masses,  no organomegaly Extremities: extremities normal, atraumatic, no cyanosis or edema Neurologic: Alert and oriented X 3,  normal strength and tone. Normal symmetric reflexes. Normal coordination and gait  ECOG PERFORMANCE STATUS: 1 - Symptomatic but completely ambulatory  Blood pressure 112/72, pulse 86, temperature 97.7 F (36.5 C), resp. rate 17, weight 191 lb 2 oz (86.7 kg), SpO2 94 %.  LABORATORY DATA: Lab Results  Component Value Date   WBC 5.0 10/25/2020   HGB 15.9 10/25/2020   HCT 45.9 10/25/2020   MCV 93.1 10/25/2020   PLT 200 10/25/2020      Chemistry      Component Value Date/Time   NA 139 10/25/2020 0902   K 4.6 10/25/2020 0902   CL 107 10/25/2020 0902   CO2 24 10/25/2020 0902   BUN 13 10/25/2020 0902   CREATININE 0.92 10/25/2020 0902      Component Value Date/Time   CALCIUM 9.3 10/25/2020 0902   ALKPHOS 84 10/25/2020 0902   AST 18 10/25/2020 0902   ALT 19 10/25/2020 0902   BILITOT 0.5 10/25/2020 0902       RADIOGRAPHIC STUDIES: NM PET Image Restage (PS) Skull Base to Thigh (F-18 FDG)  Result Date: 11/28/2020 CLINICAL DATA:  Subsequent treatment strategy for non-small cell lung cancer. Restaging. EXAM: NUCLEAR MEDICINE PET SKULL BASE TO THIGH TECHNIQUE: 9.3 mCi F-18 FDG was injected intravenously. Full-ring PET imaging was performed from the skull base to thigh after the radiotracer. CT data was obtained and used for attenuation correction and anatomic localization. Fasting blood glucose: 114 mg/dl COMPARISON:  PET-CT 01/03/2019, CT 10/25/2020 FINDINGS:  Mediastinal blood pool activity: SUV max 2.5 Liver activity: SUV max NA NECK: No hypermetabolic lymph nodes in the neck. Incidental CT findings: none CHEST: The nodule of concern in the RIGHT lower lobe measuring 22 mm (image 28/CT series 8) has intense metabolic activity with SUV max equal 9.5. This nodule is in the vicinity of the previously treated large hypermetabolic mass. Small focus of metabolic activity medial and adjacent to the perihilar consolidation with SUV max equal 5.3 (image 75). No clear corresponding lesion on CT.  There is a additional nodule in the medial RIGHT lower lobe just above the diaphragm adjacent to the post radiation change with SUV max equal 8.6 on image 90. Nodular thickening measuring 16 mm on image 89/series 4 CT. Hypermetabolic activity associated the small contralateral LEFT paratracheal lymph node with SUV max equal 4.2 on image 60. This small lymph node measures 8 mm (image 60/CT series 4). LEFT lower paratracheal node SUV max equal 3.8 similar comparison PET-CT. Incidental CT findings: none ABDOMEN/PELVIS: No abnormal hypermetabolic activity within the liver, pancreas, adrenal glands, or spleen. No hypermetabolic lymph nodes in the abdomen or pelvis. Incidental CT findings: none SKELETON: No focal hypermetabolic activity to suggest skeletal metastasis. Incidental CT findings: none IMPRESSION: 1. Hypermetabolic nodule in the RIGHT lower lobe adjacent to post radiation treatment site. This nodule has increased in size on recent surveillance CTs. Findings most consistent with lung cancer recurrence. 2. Second hypermetabolic nodule in the RIGHT lower lobe above the diaphragm is concerning for recurrence. 3. Hypermetabolic contralateral lymph node in the high LEFT paratracheal mediastinum is also concerning for metastatic adenopathy recurrence. Electronically Signed   By: Suzy Bouchard M.D.   On: 11/28/2020 16:42     ASSESSMENT AND PLAN: This is a very pleasant 65 years old white male with a stage IIIc non-small cell lung cancer, adenocarcinoma diagnosed in December 2020 The patient completed a course of concurrent chemoradiation with weekly carboplatin and paclitaxel status post 6 cycles and he tolerated his treatment well and has partial response. He underwent consolidation treatment with immunotherapy with Imfinzi 1500 mg IV every 4 weeks status post 13 cycles. He was on observation and feeling fine with no concerning complaints except for mild cough as well as the left shoulder pain. His scan  showed increase in the size of the soft tissue nodule in the right middle lobe adjacent to the postradiation changes highly suspicious for recurrent disease.  There was also soft tissue of the right hilum/as ago esophageal recess unchanged at but increased compared to remote prior studies. The patient had a PET scan that showed hypermetabolic nodule in the right lower lobe adjacent to the post radiation treatment site increased in size and consistent with lung cancer recurrence.  There was second hypermetabolic nodule in the right lower lobe above the diaphragm concerning for recurrence and hypermetabolic contralateral lymph node in the high left paratracheal mediastinum also concerning for metastatic adenopathy recurrence. I personally and independently reviewed the scan images and discussed the results with the patient and his wife. I recommended for the patient to consider treatment for his disease recurrence but he was also giving the option of palliative care.  The patient is interested in treatment and he will start systemic chemotherapy with carboplatin for AUC of 5, Alimta 500 Mg/M2 and Keytruda 200 Mg IV every 3 weeks.  I will also send the blood test to [facility] for molecular studies. If the patient has no actionable mutations, he will start the first cycle  of the systemic chemotherapy in January 01, 2021. I discussed with the patient the adverse effect of this treatment including but not limited to alopecia, myelosuppression, nausea and vomiting, peripheral neuropathy, liver or renal dysfunction as well as immunotherapy adverse effects. The patient will come back for follow-up visit with the start of his treatment. For the history of pulmonary embolism, he will continue on Xarelto. The patient was advised to call immediately if he has any other concerning symptoms in the interval.  The patient voices understanding of current disease status and treatment options and is in agreement with the  current care plan. All questions were answered. The patient knows to call the clinic with any problems, questions or concerns. We can certainly see the patient much sooner if necessary.  Disclaimer: This note was dictated with voice recognition software. Similar sounding words can inadvertently be transcribed and may not be corrected upon review.

## 2020-12-24 ENCOUNTER — Telehealth: Payer: Self-pay | Admitting: Internal Medicine

## 2020-12-24 NOTE — Telephone Encounter (Signed)
Left message with follow-up appointments per 11/18 los.

## 2020-12-31 MED FILL — Dexamethasone Sodium Phosphate Inj 100 MG/10ML: INTRAMUSCULAR | Qty: 1 | Status: AC

## 2020-12-31 MED FILL — Fosaprepitant Dimeglumine For IV Infusion 150 MG (Base Eq): INTRAVENOUS | Qty: 5 | Status: AC

## 2020-12-31 NOTE — Progress Notes (Signed)
[name] OFFICE PROGRESS NOTE  [name], MD [address] [place] [address]  DIAGNOSIS:  1) recurrent non-small cell lung cancer initially diagnosed as stage IIIc (T4, N3, M0) non-small cell lung cancer, adenocarcinoma presented with large right hilar mass in addition to bilateral hilar and mediastinal lymphadenopathy diagnosed in December 2020.  The patient has evidence for disease recurrence in October 2022. 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  Started on Xarelto on March 17, 2020 and currently 20 mg p.o. daily.  No actionable mutations by Guardant 360  PRIOR THERAPY: 1) Weekly concurrent chemoradiation with carboplatin for an AUC of 2 and paclitaxel 45 mg/m2. First dose starting 01/30/2019.   Status post 6 cycles. 2) Consolidation immunotherapy with Imfinzi 1500 mg IV every 4 weeks. First dose expected on 04/19/2019.  Status post 13 cycles. 3) disease recurrence in October 2022.  CURRENT THERAPY:  Systemic chemotherapy with carboplatin for AUC of 5, Alimta 500 Mg/M2 and Keytruda 200 Mg IV every 3 weeks.  First dose January 01, 2021.  INTERVAL HISTORY: Philip [name] 65 y.o. male returns to the clinic today for a follow-up visit.  The patient was recently found to have evidence of disease progression.  He was seen by Dr. Julien Nordmann on 12/20/2020.  Dr. Julien Nordmann recommended starting systemic chemotherapy and he is here today for evaluation before undergoing his first cycle of treatment.  Dr. Julien Nordmann also arrange for the patient to have guardant 360 blood molecular testing performed which did not show any actionable mutations.   Overall, the patient denies any fever, chills, or night sweats.  He reports some fatigue and dyspnea on exertion and a mild cough but denies any chest pain or hemoptysis.  Denies any nausea, vomiting, diarrhea, or  constipation.  Denies any headache or visual changes except a headache this morning because he didn't know if he needed to fast for labs and has not eaten today.  He is here today for evaluation before starting cycle #1.     MEDICAL HISTORY: Past Medical History:  Diagnosis Date   Hyperlipidemia    lung ca dx'd 12/2018   Pneumonia    Pulmonary embolus (HCC)    Tobacco abuse     ALLERGIES:  is allergic to norco [hydrocodone-acetaminophen].  MEDICATIONS:  Current Outpatient Medications  Medication Sig Dispense Refill   albuterol (VENTOLIN HFA) 108 (90 Base) MCG/ACT inhaler Inhale 2 puffs into the lungs every 4 (four) hours as needed for wheezing or shortness of breath. (Patient not taking: Reported on 08/20/2020) 18 g 6   atorvastatin (LIPITOR) 20 MG tablet Take 20 mg by mouth daily.     atorvastatin (LIPITOR) 20 MG tablet Take 1 tablet by mouth daily.     dextromethorphan-guaiFENesin (MUCINEX DM) 30-600 MG 12hr tablet Take 1 tablet by mouth 2 (two) times daily as needed for cough. (Patient not taking: Reported on 08/20/2020) 40 tablet 0   famotidine (PEPCID) 20 MG tablet TAKE 1 TABLET BY MOUTH TWICE A DAY 180 tablet 1   fexofenadine (ALLEGRA) 180 MG tablet Take 180 mg by mouth daily.     folic acid (FOLVITE) 1 MG tablet Take 1 tablet (1 mg total) by mouth daily. 30 tablet 4   HYDROcodone-acetaminophen (NORCO/VICODIN) 5-325 MG tablet Take 1 tablet by mouth every 4 (four) hours as needed for moderate pain. 15 tablet 0   naphazoline-pheniramine (VISINE) 0.025-0.3 %  ophthalmic solution 1 drop into both eyes as needed for red eye     Nebulizers (COMPRESSOR/NEBULIZER) MISC 1 application by Does not apply route every 4 (four) hours as needed. (Patient not taking: Reported on 08/20/2020) 1 each 0   prochlorperazine (COMPAZINE) 10 MG tablet Take 1 tablet (10 mg total) by mouth every 6 (six) hours as needed for nausea or vomiting. 30 tablet 0   rivaroxaban (XARELTO) 20 MG TABS tablet 1 tablet with  food     SPIRIVA RESPIMAT 1.25 MCG/ACT AERS Inhale 2 puffs into the lungs daily.     Tiotropium Bromide-Olodaterol (STIOLTO RESPIMAT) 2.5-2.5 MCG/ACT AERS Inhale 2 puffs into the lungs daily. (Patient not taking: Reported on 03/14/2020) 3 each 3   XARELTO 20 MG TABS tablet SMARTSIG:1 Tablet(s) By Mouth Every Evening     No current facility-administered medications for this visit.    SURGICAL HISTORY:  Past Surgical History:  Procedure Laterality Date   COLONOSCOPY     HAND SURGERY     VIDEO BRONCHOSCOPY WITH ENDOBRONCHIAL ULTRASOUND N/A 12/22/2018   Procedure: VIDEO BRONCHOSCOPY WITH ENDOBRONCHIAL ULTRASOUND;  Surgeon: Garner Nash, DO;  Location: [facility];  Service: Thoracic;  Laterality: N/A;   VIDEO BRONCHOSCOPY WITH ENDOBRONCHIAL ULTRASOUND N/A 01/09/2019   Procedure: VIDEO BRONCHOSCOPY WITH ENDOBRONCHIAL ULTRASOUND WITH FLUORO;  Surgeon: Garner Nash, DO;  Location: [facility];  Service: Thoracic;  Laterality: N/A;   VIDEO BRONCHOSCOPY WITH RADIAL ENDOBRONCHIAL ULTRASOUND N/A 12/22/2018   Procedure: RADIAL ENDOBRONCHIAL ULTRASOUND;  Surgeon: Garner Nash, DO;  Location: [facility];  Service: Thoracic;  Laterality: N/A;    REVIEW OF SYSTEMS:   Review of Systems  Constitutional: Positive for fatigue. Negative for appetite change, chills, fever and unexpected weight change.  HENT:   Negative for mouth sores, nosebleeds, sore throat and trouble swallowing.   Eyes: Negative for eye problems and icterus.  Respiratory: Positive for baseline intermittent cough and dyspnea on exertion. Negative for hemoptysis and wheezing.   Cardiovascular: Negative for chest pain and leg swelling.  Gastrointestinal: Negative for abdominal pain, constipation, diarrhea, nausea and vomiting.  Genitourinary: Negative for bladder incontinence, difficulty urinating, dysuria, frequency and hematuria.   Musculoskeletal: Negative for back pain, gait problem, neck pain and neck stiffness.  Skin: Negative for itching  and rash.  Neurological: Negative for dizziness, extremity weakness, gait problem, headaches, light-headedness and seizures.  Hematological: Negative for adenopathy. Does not bruise/bleed easily.  Psychiatric/Behavioral: Negative for confusion, depression and sleep disturbance. The patient is not nervous/anxious.     PHYSICAL EXAMINATION:  Blood pressure 111/84, pulse 67, temperature (!) 96.8 F (36 C), temperature source Tympanic, resp. rate 20, height 5' 6.5" (1.689 m), weight 192 lb 9.6 oz (87.4 kg), SpO2 96 %.  ECOG PERFORMANCE STATUS: 1  Physical Exam  Constitutional: Oriented to person, place, and time and well-developed, well-nourished, and in no distress.  HENT:  Head: Normocephalic and atraumatic.  Mouth/Throat: Oropharynx is clear and moist. No oropharyngeal exudate.  Eyes: Conjunctivae are normal. Right eye exhibits no discharge. Left eye exhibits no discharge. No scleral icterus.  Neck: Normal range of motion. Neck supple.  Cardiovascular: Normal rate, regular rhythm, normal heart sounds and intact distal pulses.   Pulmonary/Chest: Effort normal. Decreased breath sounds in right lung. No respiratory distress. No wheezes. No rales.  Abdominal: Soft. Bowel sounds are normal. Exhibits no distension and no mass. There is no tenderness.  Musculoskeletal: Normal range of motion. Exhibits no edema.  Lymphadenopathy:    No cervical adenopathy.  Neurological: Alert and oriented to person, place, and time. Exhibits normal muscle tone. Gait normal. Coordination normal.  Skin: Skin is warm and dry. No rash noted. Not diaphoretic. No erythema. No pallor.  Psychiatric: Mood, memory and judgment normal.  Vitals reviewed.  LABORATORY DATA: Lab Results  Component Value Date   WBC 4.7 01/01/2021   HGB 15.5 01/01/2021   HCT 46.3 01/01/2021   MCV 93.5 01/01/2021   PLT 200 01/01/2021      Chemistry      Component Value Date/Time   NA 139 01/01/2021 1106   K 4.2 01/01/2021 1106    CL 105 01/01/2021 1106   CO2 26 01/01/2021 1106   BUN 16 01/01/2021 1106   CREATININE 0.95 01/01/2021 1106      Component Value Date/Time   CALCIUM 9.0 01/01/2021 1106   ALKPHOS 78 01/01/2021 1106   AST 21 01/01/2021 1106   ALT 23 01/01/2021 1106   BILITOT 0.6 01/01/2021 1106       RADIOGRAPHIC STUDIES:  No results found.   ASSESSMENT/PLAN:  This is a very pleasant 65 year old Caucasian male initially diagnosed with stage IIIc non-small cell lung cancer, adenocarcinoma.  He was diagnosed in 2020. Molecular studies negative for any actionable mutations.  He was found to have disease recurrence in October 2022. No actionable mutations by guardant 360.   The patient initially completed concurrent chemoradiation with weekly carboplatin for an AUC of 2 and paclitaxel 45 mg per metered squared.  He status post 6 cycles.  He tolerated this well with a partial response.  He then underwent consolidation immunotherapy with Imfinzi 1500 mg IV every 4 weeks.  He status post 13 cycles.  In September 2022, the patient scan showed an increase in the size of the soft tissue nodule in the right middle lobe adjacent to the post radiation changes which is highly suspicious for disease recurrence.  He also has a soft tissue of the right hilum/azygoesophageal recess increased compared to remote prior studies.  The patient a PET scan that showed hypermetabolic nodule in the right lower lobe adjacent to the post radiation treatment site is increased consistent with lung cancer recurrence.  There is also a second hypermetabolic nodule in the right lower lobe above the diaphragm concerning for recurrence and hypermetabolic contralateral lymph node in the high left paratracheal mediastinum also concerning for metastatic adenopathy recurrence.  Dr. Julien Nordmann arranged for the patient to have guardant 360 molecular testing performed which showed no actionable mutations.  Therefore, Dr. Julien Nordmann recommend the  patient start palliative systemic chemotherapy with carboplatin for an AUC of 5, Alimta 500 mg per metered square, Keytruda 200 mg IV every 3 weeks.  He is due to start his first cycle of treatment today.    Labs were reviewed.  Recommend that he proceed with cycle #1 today scheduled.  We will see him back for follow-up visit in 3 weeks for evaluation before starting cycle #2.  For the history of pulmonary embolism, he will continue on Xarelto.  He is still working and requested that we move his weekly labs to Mondays.   The patient was advised to call immediately if he has any concerning symptoms in the interval. The patient voices understanding of current disease status and treatment options and is in agreement with the current care plan. All questions were answered. The patient knows to call the clinic with any problems, questions or concerns. We can certainly see the patient much sooner if necessary  No orders of the defined types were placed in this encounter.     The total time spent in the appointment was 20-29 minutes.   Yasiel Goyne L Nickalaus Crooke, [name] 01/01/21

## 2021-01-01 ENCOUNTER — Other Ambulatory Visit: Payer: Self-pay

## 2021-01-01 ENCOUNTER — Inpatient Hospital Stay: Payer: No Typology Code available for payment source

## 2021-01-01 ENCOUNTER — Inpatient Hospital Stay (HOSPITAL_BASED_OUTPATIENT_CLINIC_OR_DEPARTMENT_OTHER): Payer: No Typology Code available for payment source | Admitting: Physician Assistant

## 2021-01-01 VITALS — BP 111/84 | HR 67 | Temp 96.8°F | Resp 20 | Ht 66.5 in | Wt 192.6 lb

## 2021-01-01 DIAGNOSIS — C3491 Malignant neoplasm of unspecified part of right bronchus or lung: Secondary | ICD-10-CM

## 2021-01-01 DIAGNOSIS — Z5112 Encounter for antineoplastic immunotherapy: Secondary | ICD-10-CM | POA: Diagnosis not present

## 2021-01-01 DIAGNOSIS — C3490 Malignant neoplasm of unspecified part of unspecified bronchus or lung: Secondary | ICD-10-CM | POA: Diagnosis not present

## 2021-01-01 DIAGNOSIS — Z5111 Encounter for antineoplastic chemotherapy: Secondary | ICD-10-CM | POA: Diagnosis not present

## 2021-01-01 LAB — CBC WITH DIFFERENTIAL (CANCER CENTER ONLY)
Abs Immature Granulocytes: 0.03 10*3/uL (ref 0.00–0.07)
Basophils Absolute: 0.1 10*3/uL (ref 0.0–0.1)
Basophils Relative: 1 %
Eosinophils Absolute: 0.2 10*3/uL (ref 0.0–0.5)
Eosinophils Relative: 3 %
HCT: 46.3 % (ref 39.0–52.0)
Hemoglobin: 15.5 g/dL (ref 13.0–17.0)
Immature Granulocytes: 1 %
Lymphocytes Relative: 27 %
Lymphs Abs: 1.3 10*3/uL (ref 0.7–4.0)
MCH: 31.3 pg (ref 26.0–34.0)
MCHC: 33.5 g/dL (ref 30.0–36.0)
MCV: 93.5 fL (ref 80.0–100.0)
Monocytes Absolute: 0.6 10*3/uL (ref 0.1–1.0)
Monocytes Relative: 12 %
Neutro Abs: 2.6 10*3/uL (ref 1.7–7.7)
Neutrophils Relative %: 56 %
Platelet Count: 200 10*3/uL (ref 150–400)
RBC: 4.95 MIL/uL (ref 4.22–5.81)
RDW: 12.5 % (ref 11.5–15.5)
WBC Count: 4.7 10*3/uL (ref 4.0–10.5)
nRBC: 0 % (ref 0.0–0.2)

## 2021-01-01 LAB — CMP (CANCER CENTER ONLY)
ALT: 23 U/L (ref 0–44)
AST: 21 U/L (ref 15–41)
Albumin: 4 g/dL (ref 3.5–5.0)
Alkaline Phosphatase: 78 U/L (ref 38–126)
Anion gap: 8 (ref 5–15)
BUN: 16 mg/dL (ref 8–23)
CO2: 26 mmol/L (ref 22–32)
Calcium: 9 mg/dL (ref 8.9–10.3)
Chloride: 105 mmol/L (ref 98–111)
Creatinine: 0.95 mg/dL (ref 0.61–1.24)
GFR, Estimated: >60 mL/min (ref >60)
Glucose, Bld: 89 mg/dL (ref 70–99)
Potassium: 4.2 mmol/L (ref 3.5–5.1)
Sodium: 139 mmol/L (ref 135–145)
Total Bilirubin: 0.6 mg/dL (ref 0.3–1.2)
Total Protein: 7 g/dL (ref 6.5–8.1)

## 2021-01-01 MED ORDER — SODIUM CHLORIDE 0.9 % IV SOLN
576.5000 mg | Freq: Once | INTRAVENOUS | Status: AC
  Administered 2021-01-01: 580 mg via INTRAVENOUS
  Filled 2021-01-01: qty 58

## 2021-01-01 MED ORDER — SODIUM CHLORIDE 0.9 % IV SOLN: Freq: Once | INTRAVENOUS | Status: AC

## 2021-01-01 MED ORDER — SODIUM CHLORIDE 0.9 % IV SOLN
150.0000 mg | Freq: Once | INTRAVENOUS | Status: AC
  Administered 2021-01-01: 150 mg via INTRAVENOUS
  Filled 2021-01-01: qty 150

## 2021-01-01 MED ORDER — SODIUM CHLORIDE 0.9 % IV SOLN
10.0000 mg | Freq: Once | INTRAVENOUS | Status: AC
  Administered 2021-01-01: 10 mg via INTRAVENOUS
  Filled 2021-01-01: qty 10

## 2021-01-01 MED ORDER — SODIUM CHLORIDE 0.9 % IV SOLN
500.0000 mg/m2 | Freq: Once | INTRAVENOUS | Status: AC
  Administered 2021-01-01: 1000 mg via INTRAVENOUS
  Filled 2021-01-01: qty 40

## 2021-01-01 MED ORDER — SODIUM CHLORIDE 0.9 % IV SOLN
200.0000 mg | Freq: Once | INTRAVENOUS | Status: AC
  Administered 2021-01-01: 200 mg via INTRAVENOUS
  Filled 2021-01-01: qty 8

## 2021-01-01 MED ORDER — PALONOSETRON HCL INJECTION 0.25 MG/5ML
0.2500 mg | Freq: Once | INTRAVENOUS | Status: AC
  Administered 2021-01-01: 0.25 mg via INTRAVENOUS
  Filled 2021-01-01: qty 5

## 2021-01-01 NOTE — Patient Instructions (Signed)
[name]  Discharge Instructions: Thank you for choosing [facility] to provide your oncology and hematology care.   If you have a lab appointment with the [facility], please go directly to the [facility] and check in at the registration area.   Wear comfortable clothing and clothing appropriate for easy access to any Portacath or PICC line.   We strive to give you quality time with your provider. You may need to reschedule your appointment if you arrive late (15 or more minutes).  Arriving late affects you and other patients whose appointments are after yours.  Also, if you miss three or more appointments without notifying the office, you may be dismissed from the clinic at the provider's discretion.      For prescription refill requests, have your pharmacy contact our office and allow 72 hours for refills to be completed.    Today you received the following chemotherapy and/or immunotherapy agents Keytruda, Alimta, and Carboplatin      To help prevent nausea and vomiting after your treatment, we encourage you to take your nausea medication as directed.  BELOW ARE SYMPTOMS THAT SHOULD BE REPORTED IMMEDIATELY: *FEVER GREATER THAN 100.4 F (38 C) OR HIGHER *CHILLS OR SWEATING *NAUSEA AND VOMITING THAT IS NOT CONTROLLED WITH YOUR NAUSEA MEDICATION *UNUSUAL SHORTNESS OF BREATH *UNUSUAL BRUISING OR BLEEDING *URINARY PROBLEMS (pain or burning when urinating, or frequent urination) *BOWEL PROBLEMS (unusual diarrhea, constipation, pain near the anus) TENDERNESS IN MOUTH AND THROAT WITH OR WITHOUT PRESENCE OF ULCERS (sore throat, sores in mouth, or a toothache) UNUSUAL RASH, SWELLING OR PAIN  UNUSUAL VAGINAL DISCHARGE OR ITCHING   Items with * indicate a potential emergency and should be followed up as soon as possible or go to the Emergency Department if any problems should occur.  Please show the CHEMOTHERAPY ALERT CARD or IMMUNOTHERAPY  ALERT CARD at check-in to the Emergency Department and triage nurse.  Should you have questions after your visit or need to cancel or reschedule your appointment, please contact [facility]  Dept: [phone]  and follow the prompts.  Office hours are 8:00 a.m. to 4:30 p.m. Monday - Friday. Please note that voicemails left after 4:00 p.m. may not be returned until the following business day.  We are closed weekends and major holidays. You have access to a nurse at all times for urgent questions. Please call the main number to the clinic Dept: [phone] and follow the prompts.   For any non-urgent questions, you may also contact your provider using MyChart. We now offer e-Visits for anyone 27 and older to request care online for non-urgent symptoms. For details visit [email or web address].   Also download the MyChart app! Go to the app store, search "MyChart", open the app, select [facility], and log in with your MyChart username and password.  Due to Covid, a mask is required upon entering the hospital/clinic. If you do not have a mask, one will be given to you upon arrival. For doctor visits, patients may have 1 support person aged 33 or older with them. For treatment visits, patients cannot have anyone with them due to current Covid guidelines and our immunocompromised population.

## 2021-01-06 ENCOUNTER — Other Ambulatory Visit: Payer: No Typology Code available for payment source

## 2021-01-08 ENCOUNTER — Inpatient Hospital Stay (HOSPITAL_BASED_OUTPATIENT_CLINIC_OR_DEPARTMENT_OTHER): Payer: No Typology Code available for payment source | Admitting: Physician Assistant

## 2021-01-08 ENCOUNTER — Other Ambulatory Visit: Payer: Self-pay

## 2021-01-08 ENCOUNTER — Other Ambulatory Visit: Payer: No Typology Code available for payment source

## 2021-01-08 ENCOUNTER — Inpatient Hospital Stay: Payer: No Typology Code available for payment source | Attending: Internal Medicine

## 2021-01-08 VITALS — BP 122/91 | HR 114 | Temp 98.6°F | Resp 18

## 2021-01-08 DIAGNOSIS — M7918 Myalgia, other site: Secondary | ICD-10-CM

## 2021-01-08 DIAGNOSIS — M5459 Other low back pain: Secondary | ICD-10-CM | POA: Diagnosis not present

## 2021-01-08 DIAGNOSIS — Z5111 Encounter for antineoplastic chemotherapy: Secondary | ICD-10-CM | POA: Insufficient documentation

## 2021-01-08 DIAGNOSIS — E785 Hyperlipidemia, unspecified: Secondary | ICD-10-CM | POA: Insufficient documentation

## 2021-01-08 DIAGNOSIS — C3490 Malignant neoplasm of unspecified part of unspecified bronchus or lung: Secondary | ICD-10-CM | POA: Diagnosis not present

## 2021-01-08 DIAGNOSIS — C3491 Malignant neoplasm of unspecified part of right bronchus or lung: Secondary | ICD-10-CM | POA: Insufficient documentation

## 2021-01-08 DIAGNOSIS — Z86711 Personal history of pulmonary embolism: Secondary | ICD-10-CM | POA: Insufficient documentation

## 2021-01-08 LAB — CMP (CANCER CENTER ONLY)
ALT: 27 U/L (ref 0–44)
AST: 18 U/L (ref 15–41)
Albumin: 3.7 g/dL (ref 3.5–5.0)
Alkaline Phosphatase: 103 U/L (ref 38–126)
Anion gap: 11 (ref 5–15)
BUN: 23 mg/dL (ref 8–23)
CO2: 24 mmol/L (ref 22–32)
Calcium: 9.7 mg/dL (ref 8.9–10.3)
Chloride: 103 mmol/L (ref 98–111)
Creatinine: 1.05 mg/dL (ref 0.61–1.24)
GFR, Estimated: >60 mL/min (ref >60)
Glucose, Bld: 111 mg/dL — ABNORMAL HIGH (ref 70–99)
Potassium: 4.7 mmol/L (ref 3.5–5.1)
Sodium: 138 mmol/L (ref 135–145)
Total Bilirubin: 1.1 mg/dL (ref 0.3–1.2)
Total Protein: 7.7 g/dL (ref 6.5–8.1)

## 2021-01-08 LAB — CBC WITH DIFFERENTIAL (CANCER CENTER ONLY)
Abs Immature Granulocytes: 0.04 10*3/uL (ref 0.00–0.07)
Basophils Absolute: 0 10*3/uL (ref 0.0–0.1)
Basophils Relative: 0 %
Eosinophils Absolute: 0.1 10*3/uL (ref 0.0–0.5)
Eosinophils Relative: 3 %
HCT: 47.2 % (ref 39.0–52.0)
Hemoglobin: 16.1 g/dL (ref 13.0–17.0)
Immature Granulocytes: 1 %
Lymphocytes Relative: 19 %
Lymphs Abs: 1 10*3/uL (ref 0.7–4.0)
MCH: 31.2 pg (ref 26.0–34.0)
MCHC: 34.1 g/dL (ref 30.0–36.0)
MCV: 91.5 fL (ref 80.0–100.0)
Monocytes Absolute: 0.3 10*3/uL (ref 0.1–1.0)
Monocytes Relative: 5 %
Neutro Abs: 4.1 10*3/uL (ref 1.7–7.7)
Neutrophils Relative %: 72 %
Platelet Count: 155 10*3/uL (ref 150–400)
RBC: 5.16 MIL/uL (ref 4.22–5.81)
RDW: 11.9 % (ref 11.5–15.5)
Smear Review: NORMAL
WBC Count: 5.6 10*3/uL (ref 4.0–10.5)
nRBC: 0 % (ref 0.0–0.2)

## 2021-01-08 LAB — TSH: TSH: 1.693 u[IU]/mL (ref 0.320–4.118)

## 2021-01-08 MED ORDER — HYDROCODONE-ACETAMINOPHEN 5-325 MG PO TABS: 1.0000 | ORAL_TABLET | Freq: Four times a day (QID) | ORAL | 0 refills | Status: DC | PRN

## 2021-01-08 MED ORDER — AMOXICILLIN-POT CLAVULANATE 875-125 MG PO TABS: 1.0000 | ORAL_TABLET | Freq: Two times a day (BID) | ORAL | 0 refills | Status: AC

## 2021-01-08 NOTE — Patient Instructions (Signed)
Keep appointment with primary care doctor recheck and rectal exam tomorrow.    Continue warm soaks  Augmentin sent to your pharmacy to treat possible abscess.  Norco sent to pharmacy to help with severe pain. Do not drive or work while taking as it can make you drowsy. Take with food so it does not upset your stomach

## 2021-01-08 NOTE — Progress Notes (Signed)
Symptom Management Consult note [facility]    Patient Care Team: [name], MD as PCP - General (Family Medicine) Valrie Hart, RN as Oncology Nurse Navigator    Name of the patient: Philip [name]  [id number]  September 28, 1955   Date of visit: 01/08/2021    Chief complaint/ Reason for visit- concern for abscess  Oncology History  Adenocarcinoma of right lung, stage 3 ([place])  01/13/2019 Initial Diagnosis   Adenocarcinoma of right lung, stage 3 ([place])   01/30/2019 - 03/06/2019 Chemotherapy   The patient had palonosetron (ALOXI) injection 0.25 mg, 0.25 mg, Intravenous,  Once, 6 of 7 cycles Administration: 0.25 mg (01/30/2019), 0.25 mg (02/06/2019), 0.25 mg (02/27/2019), 0.25 mg (03/06/2019), 0.25 mg (02/13/2019), 0.25 mg (02/20/2019) CARBOplatin (PARAPLATIN) 210 mg in sodium chloride 0.9 % 250 mL chemo infusion, 210 mg (100 % of original dose 209.2 mg), Intravenous,  Once, 6 of 7 cycles Dose modification: 209.2 mg (original dose 209.2 mg, Cycle 1), 209.2 mg (original dose 209.2 mg, Cycle 5), 242.8 mg (original dose 209.2 mg, Cycle 5, Reason: Change in SCr/CrCl) Administration: 210 mg (01/30/2019), 210 mg (02/06/2019), 210 mg (02/27/2019), 210 mg (03/06/2019), 210 mg (02/13/2019), 210 mg (02/20/2019) PACLitaxel (TAXOL) 90 mg in sodium chloride 0.9 % 250 mL chemo infusion (</= 80mg /m2), 45 mg/m2 = 90 mg, Intravenous,  Once, 6 of 7 cycles Administration: 90 mg (01/30/2019), 90 mg (02/06/2019), 90 mg (02/27/2019), 90 mg (03/06/2019), 90 mg (02/13/2019), 90 mg (02/20/2019)   for chemotherapy treatment.     04/19/2019 - 04/18/2020 Chemotherapy   Patient is on Treatment Plan : LUNG NSCLC Durvalumab q28d     03/21/2020 Cancer Staging   Staging form: Lung, AJCC 8th Edition - Clinical: Stage IIIC (cT4, cN3, cM0) - Signed by [name], MD on 03/21/2020    01/01/2021 -  Chemotherapy   Patient is on Treatment Plan : LUNG CARBOplatin / Pemetrexed / Pembrolizumab q21d Induction x 4 cycles /  Maintenance Pemetrexed + Pembrolizumab       Current Therapy: carboplatin, pemetrexed, pembrolizumab with last treatment 01/01/21  Interval history- Philip [name] is a 65 yo male with oncologic history as stated above and PE on Xarelto presenting to [facility] today with chief complaint of pain on left buttock concerning for abscess. Patient states pain has been present x 1 week. He first noticed it after his chemo. He describes pain as sore and aching.  Pain is constant and worse with pressure.  He rates pain 10 out of 10 in severity.  He states the pain radiates throughout his left buttock.  He tried applying a lidocaine patch to his left buttock and has been taking sitz bath's with minimal symptom improvement.  Also admits to taking Tylenol daily without improvement.  He reports history of hemorrhoids in the past that always resolved with over-the-counter treatments.  States this is not feel like a hemorrhoid.  States after his last chemo he felt fatigued, generalized body aches chills and sweats.  He has not had any fevers.  He denies any rectal trauma or foreign body insertion.  He also states after his last chemo treatments he had a range of stool consistently stating symptoms it was soft and loose other times it was hard.  Last bowel movement was yesterday.  He denies any associated rectal pain.  Denies any rash, bleeding, open wounds, blood in stool.  Denies any abdominal pain, nausea or vomiting.  No urinary symptoms.      ROS  All other systems are reviewed and are negative for acute change except as noted in the HPI.    Allergies  Allergen Reactions   Norco [Hydrocodone-Acetaminophen] Other (See Comments)    Made pt feel jittery      Past Medical History:  Diagnosis Date   Hyperlipidemia    lung ca dx'd 12/2018   Pneumonia    Pulmonary embolus ([place])    Tobacco abuse      Past Surgical History:  Procedure Laterality Date   COLONOSCOPY     HAND SURGERY     VIDEO  BRONCHOSCOPY WITH ENDOBRONCHIAL ULTRASOUND N/A 12/22/2018   Procedure: VIDEO BRONCHOSCOPY WITH ENDOBRONCHIAL ULTRASOUND;  Surgeon: Garner Nash, DO;  Location: [facility];  Service: Thoracic;  Laterality: N/A;   VIDEO BRONCHOSCOPY WITH ENDOBRONCHIAL ULTRASOUND N/A 01/09/2019   Procedure: VIDEO BRONCHOSCOPY WITH ENDOBRONCHIAL ULTRASOUND WITH FLUORO;  Surgeon: Garner Nash, DO;  Location: [facility];  Service: Thoracic;  Laterality: N/A;   VIDEO BRONCHOSCOPY WITH RADIAL ENDOBRONCHIAL ULTRASOUND N/A 12/22/2018   Procedure: RADIAL ENDOBRONCHIAL ULTRASOUND;  Surgeon: Garner Nash, DO;  Location: [facility];  Service: Thoracic;  Laterality: N/A;    Social History   Socioeconomic History   Marital status: Married    Spouse name: Not on file   Number of children: Not on file   Years of education: Not on file   Highest education level: Not on file  Occupational History   Not on file  Tobacco Use   Smoking status: Light Smoker    Years: 45.00    Types: Cigarettes    Start date: 07/04/1970   Smokeless tobacco: Former    Types: Nurse, children's Use: Never used  Substance and Sexual Activity   Alcohol use: Yes    Comment: drinks 1/2 5th every weekend   Drug use: Not Currently    Types: Marijuana   Sexual activity: Not on file  Other Topics Concern   Not on file  Social History Narrative   Not on file   Social Determinants of Health   Financial Resource Strain: Not on file  Food Insecurity: Not on file  Transportation Needs: Not on file  Physical Activity: Not on file  Stress: Not on file  Social Connections: Not on file  Intimate Partner Violence: Not on file    Family History  Problem Relation Age of Onset   Cancer Father    Stomach cancer Father      Current Outpatient Medications:    amoxicillin-clavulanate (AUGMENTIN) 875-125 MG tablet, Take 1 tablet by mouth 2 (two) times daily for 7 days., Disp: 14 tablet, Rfl: 0   HYDROcodone-acetaminophen (NORCO) 5-325 MG  tablet, Take 1 tablet by mouth every 6 (six) hours as needed for moderate pain., Disp: 5 tablet, Rfl: 0   albuterol (VENTOLIN HFA) 108 (90 Base) MCG/ACT inhaler, Inhale 2 puffs into the lungs every 4 (four) hours as needed for wheezing or shortness of breath. (Patient not taking: Reported on 08/20/2020), Disp: 18 g, Rfl: 6   atorvastatin (LIPITOR) 20 MG tablet, Take 20 mg by mouth daily., Disp: , Rfl:    atorvastatin (LIPITOR) 20 MG tablet, Take 1 tablet by mouth daily., Disp: , Rfl:    dextromethorphan-guaiFENesin (MUCINEX DM) 30-600 MG 12hr tablet, Take 1 tablet by mouth 2 (two) times daily as needed for cough. (Patient not taking: Reported on 08/20/2020), Disp: 40 tablet, Rfl: 0   famotidine (PEPCID) 20 MG tablet, TAKE 1 TABLET BY MOUTH TWICE  A DAY, Disp: 180 tablet, Rfl: 1   fexofenadine (ALLEGRA) 180 MG tablet, Take 180 mg by mouth daily., Disp: , Rfl:    folic acid (FOLVITE) 1 MG tablet, Take 1 tablet (1 mg total) by mouth daily., Disp: 30 tablet, Rfl: 4   naphazoline-pheniramine (VISINE) 0.025-0.3 % ophthalmic solution, 1 drop into both eyes as needed for red eye, Disp: , Rfl:    Nebulizers (COMPRESSOR/NEBULIZER) MISC, 1 application by Does not apply route every 4 (four) hours as needed. (Patient not taking: Reported on 08/20/2020), Disp: 1 each, Rfl: 0   prochlorperazine (COMPAZINE) 10 MG tablet, Take 1 tablet (10 mg total) by mouth every 6 (six) hours as needed for nausea or vomiting., Disp: 30 tablet, Rfl: 0   rivaroxaban (XARELTO) 20 MG TABS tablet, 1 tablet with food, Disp: , Rfl:    SPIRIVA RESPIMAT 1.25 MCG/ACT AERS, Inhale 2 puffs into the lungs daily., Disp: , Rfl:    Tiotropium Bromide-Olodaterol (STIOLTO RESPIMAT) 2.5-2.5 MCG/ACT AERS, Inhale 2 puffs into the lungs daily. (Patient not taking: Reported on 03/14/2020), Disp: 3 each, Rfl: 3   XARELTO 20 MG TABS tablet, SMARTSIG:1 Tablet(s) By Mouth Every Evening, Disp: , Rfl:   PHYSICAL EXAM: ECOG FS:1 - Symptomatic but completely  ambulatory    Vitals:   01/08/21 1141  BP: (!) 122/91  Pulse: (!) 114  Resp: 18  Temp: 98.6 F (37 C)  TempSrc: Oral  SpO2: 96%   Physical Exam Vitals and nursing note reviewed.  Constitutional:      Appearance: He is well-developed. He is not ill-appearing or toxic-appearing.     Comments: Uncomfortable appearing, shifting in chair.  HENT:     Head: Normocephalic and atraumatic.     Nose: Nose normal.  Eyes:     General: No scleral icterus.       Right eye: No discharge.        Left eye: No discharge.     Conjunctiva/sclera: Conjunctivae normal.  Neck:     Vascular: No JVD.  Cardiovascular:     Rate and Rhythm: Regular rhythm. Tachycardia present.     Pulses: Normal pulses.     Heart sounds: Normal heart sounds.  Pulmonary:     Effort: Pulmonary effort is normal.     Breath sounds: Normal breath sounds.  Abdominal:     General: There is no distension.  Genitourinary:    Comments: No external hemorrhoids seen.  Patient has tenderness to deep palpation of left buttock.  No overlying skin changes.  No erythema, open wounds or rash.  No testicular or scrotal tenderness or swelling. Musculoskeletal:        General: Normal range of motion.     Cervical back: Normal range of motion.  Skin:    General: Skin is warm and dry.  Neurological:     Mental Status: He is oriented to person, place, and time.     GCS: GCS eye subscore is 4. GCS verbal subscore is 5. GCS motor subscore is 6.     Comments: Fluent speech, no facial droop.  Psychiatric:        Behavior: Behavior normal.       LABORATORY DATA: I have reviewed the data as listed CBC Latest Ref Rng & Units 01/08/2021 01/01/2021 12/20/2020  WBC 4.0 - 10.5 K/uL 5.6 4.7 6.1  Hemoglobin 13.0 - 17.0 g/dL 16.1 15.5 15.2  Hematocrit 39.0 - 52.0 % 47.2 46.3 44.5  Platelets 150 - 400 K/uL 155 200 214  CMP Latest Ref Rng & Units 01/08/2021 01/01/2021 12/20/2020  Glucose 70 - 99 mg/dL 111(H) 89 81  BUN 8 - 23 mg/dL 23  16 14   Creatinine 0.61 - 1.24 mg/dL 1.05 0.95 0.95  Sodium 135 - 145 mmol/L 138 139 141  Potassium 3.5 - 5.1 mmol/L 4.7 4.2 4.6  Chloride 98 - 111 mmol/L 103 105 109  CO2 22 - 32 mmol/L 24 26 25   Calcium 8.9 - 10.3 mg/dL 9.7 9.0 9.1  Total Protein 6.5 - 8.1 g/dL 7.7 7.0 6.9  Total Bilirubin 0.3 - 1.2 mg/dL 1.1 0.6 0.4  Alkaline Phos 38 - 126 U/L 103 78 85  AST 15 - 41 U/L 18 21 22   ALT 0 - 44 U/L 27 23 21        RADIOGRAPHIC STUDIES: I have personally reviewed the radiological images as listed and agreed with the findings in the report. No images are attached to the encounter. No results found.   ASSESSMENT & PLAN: Patient is a 65 y.o. male with history of recurrent non-small cell lung cancer followed by oncologist Dr. Earlie Server.  #) Pain in left buttock-Patient is afebrile.  He was noted to be tachycardic to 114.  On my exam he is uncomfortable appearing although nontoxic.  He appears to be in pain and is shifting in his seat trying to take the pressure off of his left buttock.  Suspect tachycardia is related to pain.  Exam without external hemorrhoid, no obvious abscess.  His tenderness is to deep palpation of the left buttock.  Concern for possible abscess so will go ahead and treat with Augmentin. I have reviewed the PDMP during this encounter.  Patient has no current narcotic prescriptions.  Patient given very short course of Norco for severe pain and encouraged to continue symptomatic care at home.  Patient has appointment scheduled with his PCP tomorrow, strongly encouraged to keep that appointment for further evaluation of this pain.  Also discussed ED precautions  #) Non-small cell lung cancer-continue treatment per oncologist    Visit Diagnosis: 1. Pain in left buttock   2. Malignant neoplasm of unspecified part of unspecified bronchus or lung ([place])      No orders of the defined types were placed in this encounter.   All questions were answered. The patient knows to  call the clinic with any problems, questions or concerns. No barriers to learning was detected.  I have spent a total of 10 minutes minutes of face-to-face and non-face-to-face time, preparing to see the patient, obtaining and/or reviewing separately obtained history, performing a medically appropriate examination, counseling and educating the patient, ordering tests,  documenting clinical information in the electronic health record, and care coordination.     Thank you for allowing me to participate in the care of this patient.    Barrie Folk, [name] Department of Hematology/Oncology [facility] at [facility] Phone: [phone]  [phone]    01/08/2021 1:05 PM

## 2021-01-09 LAB — GUARDANT 360

## 2021-01-13 ENCOUNTER — Inpatient Hospital Stay: Payer: No Typology Code available for payment source

## 2021-01-13 ENCOUNTER — Other Ambulatory Visit: Payer: Self-pay

## 2021-01-13 DIAGNOSIS — C3491 Malignant neoplasm of unspecified part of right bronchus or lung: Secondary | ICD-10-CM

## 2021-01-13 DIAGNOSIS — Z5111 Encounter for antineoplastic chemotherapy: Secondary | ICD-10-CM | POA: Diagnosis not present

## 2021-01-13 LAB — CBC WITH DIFFERENTIAL (CANCER CENTER ONLY)
Abs Immature Granulocytes: 0.01 10*3/uL (ref 0.00–0.07)
Basophils Absolute: 0 10*3/uL (ref 0.0–0.1)
Basophils Relative: 1 %
Eosinophils Absolute: 0.1 10*3/uL (ref 0.0–0.5)
Eosinophils Relative: 4 %
HCT: 39.7 % (ref 39.0–52.0)
Hemoglobin: 14 g/dL (ref 13.0–17.0)
Immature Granulocytes: 0 %
Lymphocytes Relative: 36 %
Lymphs Abs: 0.8 10*3/uL (ref 0.7–4.0)
MCH: 32 pg (ref 26.0–34.0)
MCHC: 35.3 g/dL (ref 30.0–36.0)
MCV: 90.8 fL (ref 80.0–100.0)
Monocytes Absolute: 0.3 10*3/uL (ref 0.1–1.0)
Monocytes Relative: 13 %
Neutro Abs: 1 10*3/uL — ABNORMAL LOW (ref 1.7–7.7)
Neutrophils Relative %: 46 %
Platelet Count: 106 10*3/uL — ABNORMAL LOW (ref 150–400)
RBC: 4.37 MIL/uL (ref 4.22–5.81)
RDW: 11.8 % (ref 11.5–15.5)
Smear Review: NORMAL
WBC Count: 2.3 10*3/uL — ABNORMAL LOW (ref 4.0–10.5)
nRBC: 0 % (ref 0.0–0.2)

## 2021-01-13 LAB — CMP (CANCER CENTER ONLY)
ALT: 38 U/L (ref 0–44)
AST: 23 U/L (ref 15–41)
Albumin: 3.5 g/dL (ref 3.5–5.0)
Alkaline Phosphatase: 88 U/L (ref 38–126)
Anion gap: 10 (ref 5–15)
BUN: 16 mg/dL (ref 8–23)
CO2: 24 mmol/L (ref 22–32)
Calcium: 8.9 mg/dL (ref 8.9–10.3)
Chloride: 106 mmol/L (ref 98–111)
Creatinine: 0.82 mg/dL (ref 0.61–1.24)
GFR, Estimated: >60 mL/min (ref >60)
Glucose, Bld: 82 mg/dL (ref 70–99)
Potassium: 4.3 mmol/L (ref 3.5–5.1)
Sodium: 140 mmol/L (ref 135–145)
Total Bilirubin: 0.3 mg/dL (ref 0.3–1.2)
Total Protein: 7 g/dL (ref 6.5–8.1)

## 2021-01-15 ENCOUNTER — Other Ambulatory Visit: Payer: No Typology Code available for payment source

## 2021-01-15 ENCOUNTER — Emergency Department (HOSPITAL_COMMUNITY): Payer: No Typology Code available for payment source

## 2021-01-15 ENCOUNTER — Emergency Department (HOSPITAL_COMMUNITY)
Admission: EM | Admit: 2021-01-15 | Discharge: 2021-01-15 | Disposition: A | Discharge status: Home / Self Care | Payer: No Typology Code available for payment source | Attending: Emergency Medicine | Admitting: Emergency Medicine

## 2021-01-15 ENCOUNTER — Encounter (HOSPITAL_COMMUNITY): Payer: Self-pay

## 2021-01-15 DIAGNOSIS — Z7901 Long term (current) use of anticoagulants: Secondary | ICD-10-CM | POA: Diagnosis not present

## 2021-01-15 DIAGNOSIS — R0602 Shortness of breath: Secondary | ICD-10-CM | POA: Insufficient documentation

## 2021-01-15 DIAGNOSIS — Z85118 Personal history of other malignant neoplasm of bronchus and lung: Secondary | ICD-10-CM | POA: Diagnosis not present

## 2021-01-15 DIAGNOSIS — R079 Chest pain, unspecified: Secondary | ICD-10-CM | POA: Diagnosis not present

## 2021-01-15 DIAGNOSIS — F17210 Nicotine dependence, cigarettes, uncomplicated: Secondary | ICD-10-CM | POA: Diagnosis not present

## 2021-01-15 DIAGNOSIS — Z20822 Contact with and (suspected) exposure to covid-19: Secondary | ICD-10-CM | POA: Diagnosis not present

## 2021-01-15 DIAGNOSIS — J449 Chronic obstructive pulmonary disease, unspecified: Secondary | ICD-10-CM | POA: Insufficient documentation

## 2021-01-15 LAB — BASIC METABOLIC PANEL
Anion gap: 7 (ref 5–15)
BUN: 21 mg/dL (ref 8–23)
CO2: 25 mmol/L (ref 22–32)
Calcium: 8.8 mg/dL — ABNORMAL LOW (ref 8.9–10.3)
Chloride: 105 mmol/L (ref 98–111)
Creatinine, Ser: 0.89 mg/dL (ref 0.61–1.24)
GFR, Estimated: >60 mL/min (ref >60)
Glucose, Bld: 97 mg/dL (ref 70–99)
Potassium: 4.2 mmol/L (ref 3.5–5.1)
Sodium: 137 mmol/L (ref 135–145)

## 2021-01-15 LAB — CBC
HCT: 39.9 % (ref 39.0–52.0)
Hemoglobin: 13.8 g/dL (ref 13.0–17.0)
MCH: 32.3 pg (ref 26.0–34.0)
MCHC: 34.6 g/dL (ref 30.0–36.0)
MCV: 93.4 fL (ref 80.0–100.0)
Platelets: 121 10*3/uL — ABNORMAL LOW (ref 150–400)
RBC: 4.27 MIL/uL (ref 4.22–5.81)
RDW: 12.3 % (ref 11.5–15.5)
WBC: 2.6 10*3/uL — ABNORMAL LOW (ref 4.0–10.5)
nRBC: 0 % (ref 0.0–0.2)

## 2021-01-15 LAB — PROTIME-INR
INR: 1.3 — ABNORMAL HIGH (ref 0.8–1.2)
Prothrombin Time: 16.4 seconds — ABNORMAL HIGH (ref 11.4–15.2)

## 2021-01-15 LAB — RESP PANEL BY RT-PCR (FLU A&B, COVID) ARPGX2
Influenza A by PCR: NEGATIVE
Influenza B by PCR: NEGATIVE
SARS Coronavirus 2 by RT PCR: NEGATIVE

## 2021-01-15 LAB — TROPONIN I (HIGH SENSITIVITY)
Troponin I (High Sensitivity): 6 ng/L (ref <18)
Troponin I (High Sensitivity): 5 ng/L (ref <18)

## 2021-01-15 MED ORDER — IOHEXOL 350 MG/ML SOLN
80.0000 mL | Freq: Once | INTRAVENOUS | Status: AC | PRN
  Administered 2021-01-15: 09:00:00 80 mL via INTRAVENOUS

## 2021-01-15 NOTE — ED Triage Notes (Signed)
Pt presents with c/o chest pain that started within the last day or so. Pt reports he has a hx of PE's and this feels the same. Pt also currently has lung cancer and reports his lung cancer does give him shortness of breath at baseline.

## 2021-01-15 NOTE — Discharge Instructions (Addendum)
There is no evidence of heart attack, new pulmonary embolism (blood clot), or other new heart, large vessel, or lung problems.  Please recheck with your doctor ASAP.  Return if you are having any worsening or new symptoms. As discussed, your white blood cell count was decreased from normal, please follow up with your oncologist.

## 2021-01-15 NOTE — ED Provider Notes (Signed)
[facility] DEPT Provider Note   CSN: [id number] Arrival date & time: 01/15/21  2376     History Chief Complaint  Patient presents with   Chest Pain    Philip [name] is a 65 y.o. male.  HPI 65 year old male history of recurrent non-small cell lung cancer, PE, smoker, presents today with some sharp right-sided chest pain for 2 days.  Reports he is also had some sharp pain in the left upper chest.  He feels that this is similar to when he had a PE in the past.  He has continued on anticoagulation since his first PE and has had 2 in the past, the second 1 was secondary to missed dose of blood thinners.  He has just had recurrence of his cancer and had his first round of anticoagulation 2 weeks ago.  He reports some stable shortness of breath, stable phlegm production with coughing, he is unable to tell me anything that makes his pain worse.  He is currently pain-free.  Pain was coming and going over short periods of time.  He has not had any fever, sore throat, nasal congestion, abdominal pain, nausea, vomiting, diarrhea, or lateralized leg swelling.    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   Respiratory failure, acute ([place]) 06/19/2019   History of pulmonary embolus (PE) 06/16/2019   Encounter for antineoplastic immunotherapy 04/26/2019   Odynophagia 03/13/2019   Adenocarcinoma of right lung, stage 3 ([place]) 01/13/2019   Goals of care, counseling/discussion 01/13/2019   Encounter for  antineoplastic chemotherapy 01/13/2019   Tobacco abuse counseling 01/13/2019   Pneumothorax 01/09/2019   S/P bronchoscopy with biopsy    Lung mass 12/22/2018   S/P bronchoscopy     Past Surgical History:  Procedure Laterality Date   COLONOSCOPY     HAND SURGERY     VIDEO BRONCHOSCOPY WITH ENDOBRONCHIAL ULTRASOUND N/A 12/22/2018   Procedure: VIDEO BRONCHOSCOPY WITH ENDOBRONCHIAL ULTRASOUND;  Surgeon: Garner Nash, DO;  Location: [facility] OR;  Service: Thoracic;  Laterality: N/A;   VIDEO BRONCHOSCOPY WITH ENDOBRONCHIAL ULTRASOUND N/A 01/09/2019   Procedure: VIDEO BRONCHOSCOPY WITH ENDOBRONCHIAL ULTRASOUND WITH FLUORO;  Surgeon: Garner Nash, DO;  Location: [facility];  Service: Thoracic;  Laterality: N/A;   VIDEO BRONCHOSCOPY WITH RADIAL ENDOBRONCHIAL ULTRASOUND N/A 12/22/2018   Procedure: RADIAL ENDOBRONCHIAL ULTRASOUND;  Surgeon: Garner Nash, DO;  Location: [facility] OR;  Service: Thoracic;  Laterality: N/A;       Family History  Problem Relation Age of Onset   Cancer Father    Stomach cancer Father     Social History   Tobacco Use   Smoking status: Light Smoker    Years: 45.00    Types: Cigarettes    Start date: 07/04/1970   Smokeless tobacco: Former    Types: Nurse, children's Use: Never used  Substance Use Topics   Alcohol use: Yes    Comment: drinks 1/2 5th every weekend   Drug use: Not Currently    Types:  Marijuana    Home Medications Prior to Admission medications   Medication Sig Start Date End Date Taking? Authorizing Provider  albuterol (VENTOLIN HFA) 108 (90 Base) MCG/ACT inhaler Inhale 2 puffs into the lungs every 4 (four) hours as needed for wheezing or shortness of breath. Patient not taking: Reported on 08/20/2020 06/27/19   [name], DO  atorvastatin (LIPITOR) 20 MG tablet Take 20 mg by mouth daily. 09/22/18   [provider]  atorvastatin (LIPITOR) 20 MG tablet Take 1 tablet by mouth daily.    [provider]   dextromethorphan-guaiFENesin (MUCINEX DM) 30-600 MG 12hr tablet Take 1 tablet by mouth 2 (two) times daily as needed for cough. Patient not taking: Reported on 08/20/2020 03/17/20   [name], MD  famotidine (PEPCID) 20 MG tablet TAKE 1 TABLET BY MOUTH TWICE A DAY 07/26/20   [name], MD  fexofenadine (ALLEGRA) 180 MG tablet Take 180 mg by mouth daily.    [provider]  folic acid (FOLVITE) 1 MG tablet Take 1 tablet (1 mg total) by mouth daily. 12/20/20   [name], MD  HYDROcodone-acetaminophen (NORCO) 5-325 MG tablet Take 1 tablet by mouth every 6 (six) hours as needed for moderate pain. 01/08/21   Walisiewicz, Kaitlyn E, [name]  naphazoline-pheniramine (VISINE) 0.025-0.3 % ophthalmic solution 1 drop into both eyes as needed for red eye    [provider]  Nebulizers (COMPRESSOR/NEBULIZER) MISC 1 application by Does not apply route every 4 (four) hours as needed. Patient not taking: Reported on 08/20/2020 06/19/19   [place] Shi, MD  prochlorperazine (COMPAZINE) 10 MG tablet Take 1 tablet (10 mg total) by mouth every 6 (six) hours as needed for nausea or vomiting. 12/20/20   [name], MD  rivaroxaban (XARELTO) 20 MG TABS tablet 1 tablet with food    [provider]  SPIRIVA RESPIMAT 1.25 MCG/ACT AERS Inhale 2 puffs into the lungs daily. 02/04/20   [provider]  Tiotropium Bromide-Olodaterol (STIOLTO RESPIMAT) 2.5-2.5 MCG/ACT AERS Inhale 2 puffs into the lungs daily. Patient not taking: Reported on 03/14/2020 01/15/20   [name], DO  XARELTO 20 MG TABS tablet SMARTSIG:1 Tablet(s) By Mouth Every Evening 12/05/20   [provider]    Allergies    Norco [hydrocodone-acetaminophen]  Review of Systems   Review of Systems  All other systems reviewed and are negative.  Physical Exam Updated Vital Signs BP 108/83 (BP Location: Left Arm)    Pulse 66    Temp (!) 97.5 F (36.4 C) (Oral)    Resp 19    Ht 1.676 m (5'  6")    Wt 86.2 kg    SpO2 96%    BMI 30.67 kg/m   Physical Exam Vitals and nursing note reviewed.  Constitutional:      Appearance: He is well-developed.  HENT:     Head: Normocephalic and atraumatic.  Eyes:     Pupils: Pupils are equal, round, and reactive to light.  Cardiovascular:     Rate and Rhythm: Normal rate and regular rhythm.     Pulses:          Carotid pulses are 1+ on the right side and 1+ on the left side.      Dorsalis pedis pulses are 1+ on the right side and 1+ on the left side.     Heart sounds: Normal heart sounds.  Pulmonary:     Effort: Pulmonary effort is normal.     Breath sounds: Normal  breath sounds.  Abdominal:     General: Bowel sounds are normal.     Palpations: Abdomen is soft.  Musculoskeletal:        General: Normal range of motion.     Cervical back: Normal range of motion and neck supple.  Skin:    General: Skin is warm and dry.     Capillary Refill: Capillary refill takes less than 2 seconds.  Neurological:     General: No focal deficit present.     Mental Status: He is alert.  Psychiatric:        Mood and Affect: Mood normal.    ED Results / Procedures / Treatments   Labs (all labs ordered are listed, but only abnormal results are displayed) Labs Reviewed  BASIC METABOLIC PANEL - Abnormal; Notable for the following components:      Result Value   Calcium 8.8 (*)    All other components within normal limits  CBC - Abnormal; Notable for the following components:   WBC 2.6 (*)    Platelets 121 (*)    All other components within normal limits  PROTIME-INR - Abnormal; Notable for the following components:   Prothrombin Time 16.4 (*)    INR 1.3 (*)    All other components within normal limits  RESP PANEL BY RT-PCR (FLU A&B, COVID) [id number]  TROPONIN I (HIGH SENSITIVITY)  TROPONIN I (HIGH SENSITIVITY)    EKG EKG Interpretation  Date/Time:  Wednesday January 15 2021 07:01:44 EST Ventricular Rate:  88 PR Interval:  160 QRS  Duration: 92 QT Interval:  377 QTC Calculation: 457 R Axis:   -55 Text Interpretation: Sinus rhythm LAD, consider left anterior fascicular block Abnormal R-wave progression, late transition rate has decreased since last tracing Baseline wander Otherwise no significant change since last tracing of 14 Mar 2020 Confirmed by Pattricia Boss [phone]) on 01/15/2021 7:13:15 AM  Radiology DG Chest 2 View  Result Date: 01/15/2021 CLINICAL DATA:  65 year old male with history of right-sided chest pain. EXAM: CHEST - 2 VIEW COMPARISON:  Chest x-Erandy Mceachern 03/14/2020. FINDINGS: Chronic areas of architectural distortion and volume loss are again noted in the thorax bilaterally, corresponding to chronic areas of postradiation fibrosis on recent CT examinations. Compared to the prior chest radiograph from 03/14/2020, there is no acute consolidative airspace disease. No pleural effusions. Chronic elevation of the right hemidiaphragm is again noted. No pneumothorax. No evidence of pulmonary edema. Heart size is normal. IMPRESSION: 1. Chronic postradiation changes in the lungs, similar to prior studies, without definite radiographic evidence of acute cardiopulmonary disease. Electronically Signed   By: Vinnie Langton M.D.   On: 01/15/2021 07:23   CT Angio Chest PE W and/or Wo Contrast  Result Date: 01/15/2021 CLINICAL DATA:  Lung carcinoma, history of PE, high prob PE suspected EXAM: CT ANGIOGRAPHY CHEST WITH CONTRAST TECHNIQUE: Multidetector CT imaging of the chest was performed using the standard protocol during bolus administration of intravenous contrast. Multiplanar CT image reconstructions and MIPs were obtained to evaluate the vascular anatomy. CONTRAST:  45mL OMNIPAQUE IOHEXOL 350 MG/ML SOLN COMPARISON:  10/25/2020 and previous FINDINGS: Cardiovascular: Heart size normal. No pericardial effusion. Mild RV dilatation. Good contrast opacification of pulmonary artery. 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 No new filling defects to suggest acute PE. 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 Coronary calcifications. Adequate contrast opacification of the thoracic aorta with no evidence of dissection, aneurysm, or stenosis. There is classic 3-vessel brachiocephalic arch anatomy without proximal stenosis.  Mediastinum/Nodes: Borderline pre-vascular and AP window adenopathy stable. Progressive parenchymal consolidation in the about the right hilum obscures individual lymph nodes. Lungs/Pleura: No pleural effusion. No pneumothorax. Pulmonary emphysema. Perihilar parenchymal consolidation extending medially in the right lower lobe as before, presumably related to radiation treatments. contiguous masslike consolidation in the right suprahilar region and medial right upper lobe,Progressive since 11/27/2020. 2.4 cm nodular recurrence in the right lower lobe (6:68), previously 2.2 cm. Upper Abdomen: 2.8 cm left adrenal adenoma as before. No acute findings. Musculoskeletal: Anterior vertebral endplate spurring at multiple levels in the lower thoracic spine. No fracture or worrisome bone lesion. Review of the MIP images confirms the above findings. IMPRESSION: 1. Negative for acute PE or thoracic aortic dissection. 2. 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 3. Presumed radiation changes about the right hilum with progressive right lung lower lobe nodule and medial right upper lobe disease. 4. Coronary and Aortic Atherosclerosis (ICD10-170.0). 5.  Emphysema (ICD10-J43.9). Electronically Signed   By: Lucrezia Europe M.D.   On: 01/15/2021 09:34    Procedures Procedures   Medications Ordered in ED Medications  iohexol (OMNIPAQUE) 350 MG/ML injection 80 mL (80 mLs Intravenous Contrast Given 01/15/21 3545)    ED Course  I have reviewed the triage vital signs and the nursing notes.  Pertinent labs & imaging results that were available during my care of the  patient were reviewed by me and considered in my medical decision making (see chart for details).  Clinical Course as of 01/16/21 1458  Wed Jan 15, 2021  6256 Leukopenia noted with white blood cell count of 2600 and mild hypocalcemia noted [DR]  0851 Chest x-Shyler Hamill reviewed with postradiation changes noted as per radiologist reading Chest x-Marjory Meints personally reviewed and radiology interpretation reviewed [DR]  1015 CT reviewed and no evidence of acute PE or other acute abnormalities CT personally reviewed and radiology reading reviewed First troponin is normal. [DR]  1019 Discussed results with patient. Mild leukopenia Second troponin has been drawn and is pending Plan discharge if second troponin is normal [DR]    Clinical Course User Index [DR] Pattricia Boss, MD   MDM Rules/Calculators/A&P                         65 yo male with recurrent lung ca, s/p first round of chemo for this recurrent diagnosis presents today with sharp right sided chest pain f/b some sharp left sided cp.  Pain present for several days, comes and goes, no current pain.  Patient feel this is like prior PE.  On chronic anticoagulation and has not missed any doses with last dose loc at 6 pm.  No fever chills or other infectious symptoms. EKG without acute changes CXR without acute changes Awaiting CTA chest and troponins DDX PE-plan cta CAD/coronary syndrome-ekg without acute changes, will trend troponin Pain from cancer- effusion/infarct-awaiting ct Infection- no inflitrate on cxr, pain atypical, VSS, patient did not have flu vaccine or most recent covid booster. Mechanical/chest wall- no known injuries, not reproducible Final Clinical Impression(s) / ED Diagnoses Final diagnoses:  Chest pain, unspecified type    Rx / DC Orders ED Discharge Orders     None        Pattricia Boss, MD 01/16/21 1458

## 2021-01-16 ENCOUNTER — Telehealth: Payer: Self-pay | Admitting: Medical Oncology

## 2021-01-16 NOTE — Telephone Encounter (Signed)
intermittent anterior chest pain L and R side x 1 week with mild itchy rash /bumps on chest.  The symptoms started right after he started Amoxicillin  a week ago.   He said he is very gassy and has indigestion after he eats.  He went to ED last night for chest pain described above and CT angio done -No evidence of PE.   I told him it could be mild allergic reaction to amoxicillin and I instructed pt to take benadryl 50 mg now and to not drive. If symptoms worsen he needs to call EMS.  I asked him to call back tomorrow with update.

## 2021-01-17 ENCOUNTER — Encounter: Payer: Self-pay | Admitting: Internal Medicine

## 2021-01-18 ENCOUNTER — Other Ambulatory Visit: Payer: Self-pay

## 2021-01-20 ENCOUNTER — Telehealth: Payer: Self-pay | Admitting: Medical Oncology

## 2021-01-20 ENCOUNTER — Encounter: Payer: Self-pay | Admitting: Internal Medicine

## 2021-01-20 NOTE — Telephone Encounter (Signed)
Vision change- Blurred "mid vision"  x 3 weeks. Does not wear glasses. He uses readers without any effects. Denies headache, falls , balance issues , extremity weakness or numbness. He does not have an ophthalmologist . I encouraged him to get an appt with one and I will f/u with Dr Julien Nordmann.  If he has worsening symptoms or weakness , numbness, headaches , balance issue he needs to go to ED.

## 2021-01-21 ENCOUNTER — Encounter: Payer: Self-pay | Admitting: Internal Medicine

## 2021-01-22 ENCOUNTER — Inpatient Hospital Stay (HOSPITAL_BASED_OUTPATIENT_CLINIC_OR_DEPARTMENT_OTHER): Payer: No Typology Code available for payment source | Admitting: Internal Medicine

## 2021-01-22 ENCOUNTER — Inpatient Hospital Stay: Payer: No Typology Code available for payment source

## 2021-01-22 ENCOUNTER — Ambulatory Visit (HOSPITAL_BASED_OUTPATIENT_CLINIC_OR_DEPARTMENT_OTHER): Payer: Self-pay | Admitting: Physician Assistant

## 2021-01-22 ENCOUNTER — Encounter: Payer: Self-pay | Admitting: Internal Medicine

## 2021-01-22 ENCOUNTER — Other Ambulatory Visit: Payer: Self-pay | Admitting: Internal Medicine

## 2021-01-22 ENCOUNTER — Other Ambulatory Visit: Payer: Self-pay

## 2021-01-22 ENCOUNTER — Telehealth: Payer: Self-pay | Admitting: Medical Oncology

## 2021-01-22 VITALS — BP 106/81 | HR 91 | Temp 96.5°F | Resp 18 | Ht 66.0 in | Wt 188.1 lb

## 2021-01-22 DIAGNOSIS — C3491 Malignant neoplasm of unspecified part of right bronchus or lung: Secondary | ICD-10-CM

## 2021-01-22 DIAGNOSIS — T50905A Adverse effect of unspecified drugs, medicaments and biological substances, initial encounter: Secondary | ICD-10-CM

## 2021-01-22 DIAGNOSIS — C3490 Malignant neoplasm of unspecified part of unspecified bronchus or lung: Secondary | ICD-10-CM | POA: Diagnosis not present

## 2021-01-22 DIAGNOSIS — Z5111 Encounter for antineoplastic chemotherapy: Secondary | ICD-10-CM | POA: Diagnosis not present

## 2021-01-22 DIAGNOSIS — Z5112 Encounter for antineoplastic immunotherapy: Secondary | ICD-10-CM

## 2021-01-22 LAB — CMP (CANCER CENTER ONLY)
ALT: 24 U/L (ref 0–44)
AST: 17 U/L (ref 15–41)
Albumin: 3.8 g/dL (ref 3.5–5.0)
Alkaline Phosphatase: 85 U/L (ref 38–126)
Anion gap: 7 (ref 5–15)
BUN: 19 mg/dL (ref 8–23)
CO2: 23 mmol/L (ref 22–32)
Calcium: 9.1 mg/dL (ref 8.9–10.3)
Chloride: 107 mmol/L (ref 98–111)
Creatinine: 0.99 mg/dL (ref 0.61–1.24)
GFR, Estimated: >60 mL/min (ref >60)
Glucose, Bld: 93 mg/dL (ref 70–99)
Potassium: 4.3 mmol/L (ref 3.5–5.1)
Sodium: 137 mmol/L (ref 135–145)
Total Bilirubin: 0.4 mg/dL (ref 0.3–1.2)
Total Protein: 7 g/dL (ref 6.5–8.1)

## 2021-01-22 LAB — CBC WITH DIFFERENTIAL (CANCER CENTER ONLY)
Abs Immature Granulocytes: 0.03 10*3/uL (ref 0.00–0.07)
Basophils Absolute: 0 10*3/uL (ref 0.0–0.1)
Basophils Relative: 1 %
Eosinophils Absolute: 0.1 10*3/uL (ref 0.0–0.5)
Eosinophils Relative: 2 %
HCT: 39.5 % (ref 39.0–52.0)
Hemoglobin: 13.9 g/dL (ref 13.0–17.0)
Immature Granulocytes: 1 %
Lymphocytes Relative: 27 %
Lymphs Abs: 1 10*3/uL (ref 0.7–4.0)
MCH: 32.3 pg (ref 26.0–34.0)
MCHC: 35.2 g/dL (ref 30.0–36.0)
MCV: 91.9 fL (ref 80.0–100.0)
Monocytes Absolute: 0.5 10*3/uL (ref 0.1–1.0)
Monocytes Relative: 13 %
Neutro Abs: 2.1 10*3/uL (ref 1.7–7.7)
Neutrophils Relative %: 56 %
Platelet Count: 263 10*3/uL (ref 150–400)
RBC: 4.3 MIL/uL (ref 4.22–5.81)
RDW: 12.9 % (ref 11.5–15.5)
WBC Count: 3.8 10*3/uL — ABNORMAL LOW (ref 4.0–10.5)
nRBC: 0 % (ref 0.0–0.2)

## 2021-01-22 MED ORDER — SODIUM CHLORIDE 0.9 % IV SOLN
500.0000 mg/m2 | Freq: Once | INTRAVENOUS | Status: AC
  Administered 2021-01-22: 11:00:00 1000 mg via INTRAVENOUS
  Filled 2021-01-22: qty 40

## 2021-01-22 MED ORDER — PALONOSETRON HCL INJECTION 0.25 MG/5ML
0.2500 mg | Freq: Once | INTRAVENOUS | Status: AC
  Administered 2021-01-22: 10:00:00 0.25 mg via INTRAVENOUS
  Filled 2021-01-22: qty 5

## 2021-01-22 MED ORDER — DIPHENHYDRAMINE HCL 50 MG/ML IJ SOLN
50.0000 mg | Freq: Once | INTRAMUSCULAR | Status: AC | PRN
  Administered 2021-01-22: 12:00:00 50 mg via INTRAVENOUS

## 2021-01-22 MED ORDER — MEPERIDINE HCL 25 MG/ML IJ SOLN
12.5000 mg | Freq: Once | INTRAMUSCULAR | Status: AC
  Administered 2021-01-22: 13:00:00 12.5 mg via INTRAVENOUS
  Filled 2021-01-22: qty 1

## 2021-01-22 MED ORDER — SODIUM CHLORIDE 0.9 % IV SOLN: Freq: Once | INTRAVENOUS | Status: AC

## 2021-01-22 MED ORDER — METHYLPREDNISOLONE SODIUM SUCC 125 MG IJ SOLR
125.0000 mg | Freq: Once | INTRAMUSCULAR | Status: AC | PRN
  Administered 2021-01-22: 12:00:00 125 mg via INTRAVENOUS

## 2021-01-22 MED ORDER — FAMOTIDINE 20 MG IN NS 100 ML IVPB
20.0000 mg | Freq: Once | INTRAVENOUS | Status: AC | PRN
  Administered 2021-01-22: 12:00:00 20 mg via INTRAVENOUS

## 2021-01-22 MED ORDER — SODIUM CHLORIDE 0.9 % IV SOLN
10.0000 mg | Freq: Once | INTRAVENOUS | Status: AC
  Administered 2021-01-22: 10:00:00 10 mg via INTRAVENOUS
  Filled 2021-01-22: qty 10

## 2021-01-22 MED ORDER — SODIUM CHLORIDE 0.9 % IV SOLN
150.0000 mg | Freq: Once | INTRAVENOUS | Status: AC
  Administered 2021-01-22: 10:00:00 150 mg via INTRAVENOUS
  Filled 2021-01-22: qty 150

## 2021-01-22 MED ORDER — SODIUM CHLORIDE 0.9 % IV SOLN
576.5000 mg | Freq: Once | INTRAVENOUS | Status: AC
  Administered 2021-01-22: 12:00:00 580 mg via INTRAVENOUS
  Filled 2021-01-22: qty 58

## 2021-01-22 MED ORDER — SODIUM CHLORIDE 0.9 % IV SOLN
200.0000 mg | Freq: Once | INTRAVENOUS | Status: AC
  Administered 2021-01-22: 11:00:00 200 mg via INTRAVENOUS
  Filled 2021-01-22: qty 8

## 2021-01-22 NOTE — Progress Notes (Signed)
Hypersensitivity Reaction note  Date of event: 01/22/21 Time of event: 1148 Generic name of drug involved: Carboplatin  Name of provider notified of the hypersensitivity reaction: Mohamed  Was agent that likely caused hypersensitivity reaction added to Allergies List within EMR? Yes  Chain of events including reaction signs/symptoms, treatment administered, and outcome (e.g., drug resumed; drug discontinued; sent to Emergency Department; etc.)  1148 patient reported shortness of breath and was starting to turn red in the face. Chemo was stopped, fluids hung to gravity. Benadryl iv 50mg  was given at 1149, solumedrol 125mg  was given at 1148, pepcid 20mg  was given at 1150. Patient was also put on oxygen 6l to help with the shortness of breath. 1150 V SIGNS: 122/94, 91 HR, 98%O2 ON 6l, 97.6 TEMP. Patient receiving fluids and monitoring. Patient started having rigors. Symptom management PA present. Demerol was ordered. Demerol was given at 12:41. Patient resting receiving fluids.  1350- patient woke up from nap. Stated he felt a lot better VS 104/72, 100% o2,  pulse 65, temp 97. Patient was notified that he needs to have someone pick him since he received demerol. He voiced understanding.     Tildon Husky, RN 01/22/2021 12:25 PM

## 2021-01-22 NOTE — Telephone Encounter (Signed)
Faxed records

## 2021-01-22 NOTE — Progress Notes (Signed)
[facility] Telephone:([phone]) [phone]   Fax:(336) [phone]  OFFICE PROGRESS NOTE  [name], MD [address] [id number]  DIAGNOSIS:  1) recurrent non-small cell lung cancer initially diagnosed as stage IIIc (T4, N3, M0) non-small cell lung cancer, adenocarcinoma presented with large right hilar mass in addition to bilateral hilar and mediastinal lymphadenopathy diagnosed in December 2020.  The patient has evidence for disease recurrence in October 2022. 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  Started on Xarelto on March 17, 2020 and currently 20 mg p.o. daily.   PRIOR THERAPY:  1) Weekly concurrent chemoradiation with carboplatin for an AUC of 2 and paclitaxel 45 mg/m2. First dose starting 01/30/2019.   Status post 6 cycles. 2) Consolidation immunotherapy with Imfinzi 1500 mg IV every 4 weeks. First dose expected on 04/19/2019.  Status post 13 cycles. 3) disease recurrence in October 2022.   CURRENT THERAPY: Systemic chemotherapy with carboplatin for AUC of 5, Alimta 500 Mg/M2 and Keytruda 200 Mg IV every 3 weeks.  First dose January 01, 2021.  Status post 1 cycle.  INTERVAL HISTORY: Philip [name] 65 y.o. male returns to the clinic today for follow-up visit.  The patient is feeling fine today with no concerning complaints.  He had several days of weakness and fatigue after the first cycle of his treatment.  He was seen at the emergency department a week ago for right-sided chest pain and shortness of breath.  He had CT angiogram of the chest performed at that time that was unremarkable for any pulmonary embolism or disease progression.  The patient denied having any current chest pain but has shortness of breath with exertion with mild cough and no hemoptysis.  He denied having any fever or chills.  He has no nausea, vomiting, diarrhea  or constipation.  He has no headache or visual changes.  He is here today for evaluation before starting cycle #2.   MEDICAL HISTORY: Past Medical History:  Diagnosis Date   Hyperlipidemia    lung ca dx'd 12/2018   Pneumonia    Pulmonary embolus (HCC)    Tobacco abuse     ALLERGIES:  is allergic to norco [hydrocodone-acetaminophen].  MEDICATIONS:  Current Outpatient Medications  Medication Sig Dispense Refill   albuterol (VENTOLIN HFA) 108 (90 Base) MCG/ACT inhaler Inhale 2 puffs into the lungs every 4 (four) hours as needed for wheezing or shortness of breath. (Patient not taking: Reported on 08/20/2020) 18 g 6   atorvastatin (LIPITOR) 20 MG tablet Take 20 mg by mouth daily.     atorvastatin (LIPITOR) 20 MG tablet Take 1 tablet by mouth daily.     dextromethorphan-guaiFENesin (MUCINEX DM) 30-600 MG 12hr tablet Take 1 tablet by mouth 2 (two) times daily as needed for cough. (Patient not taking: Reported on 08/20/2020) 40 tablet 0   famotidine (PEPCID) 20 MG tablet TAKE 1 TABLET BY MOUTH TWICE A DAY 180 tablet 1   fexofenadine (ALLEGRA) 180 MG tablet Take 180 mg by mouth daily.     folic acid (FOLVITE) 1 MG tablet Take 1 tablet (1 mg total) by mouth daily. 30 tablet 4   HYDROcodone-acetaminophen (NORCO) 5-325 MG tablet Take 1 tablet by mouth every 6 (six) hours as needed for moderate pain. 5 tablet 0   naphazoline-pheniramine (VISINE) 0.025-0.3 % ophthalmic solution 1 drop into both eyes as needed for  red eye     Nebulizers (COMPRESSOR/NEBULIZER) MISC 1 application by Does not apply route every 4 (four) hours as needed. (Patient not taking: Reported on 08/20/2020) 1 each 0   prochlorperazine (COMPAZINE) 10 MG tablet Take 1 tablet (10 mg total) by mouth every 6 (six) hours as needed for nausea or vomiting. 30 tablet 0   rivaroxaban (XARELTO) 20 MG TABS tablet 1 tablet with food     SPIRIVA RESPIMAT 1.25 MCG/ACT AERS Inhale 2 puffs into the lungs daily.     Tiotropium Bromide-Olodaterol  (STIOLTO RESPIMAT) 2.5-2.5 MCG/ACT AERS Inhale 2 puffs into the lungs daily. (Patient not taking: Reported on 03/14/2020) 3 each 3   XARELTO 20 MG TABS tablet SMARTSIG:1 Tablet(s) By Mouth Every Evening     No current facility-administered medications for this visit.    SURGICAL HISTORY:  Past Surgical History:  Procedure Laterality Date   COLONOSCOPY     HAND SURGERY     VIDEO BRONCHOSCOPY WITH ENDOBRONCHIAL ULTRASOUND N/A 12/22/2018   Procedure: VIDEO BRONCHOSCOPY WITH ENDOBRONCHIAL ULTRASOUND;  Surgeon: Garner Nash, DO;  Location: [facility];  Service: Thoracic;  Laterality: N/A;   VIDEO BRONCHOSCOPY WITH ENDOBRONCHIAL ULTRASOUND N/A 01/09/2019   Procedure: VIDEO BRONCHOSCOPY WITH ENDOBRONCHIAL ULTRASOUND WITH FLUORO;  Surgeon: Garner Nash, DO;  Location: [facility];  Service: Thoracic;  Laterality: N/A;   VIDEO BRONCHOSCOPY WITH RADIAL ENDOBRONCHIAL ULTRASOUND N/A 12/22/2018   Procedure: RADIAL ENDOBRONCHIAL ULTRASOUND;  Surgeon: Garner Nash, DO;  Location: [facility];  Service: Thoracic;  Laterality: N/A;    REVIEW OF SYSTEMS:  A comprehensive review of systems was negative except for: Constitutional: positive for fatigue Respiratory: positive for cough and dyspnea on exertion   PHYSICAL EXAMINATION: General appearance: alert, cooperative, fatigued, and no distress Head: Normocephalic, without obvious abnormality, atraumatic Neck: no adenopathy, no JVD, supple, symmetrical, trachea midline, and thyroid not enlarged, symmetric, no tenderness/mass/nodules Lymph nodes: Cervical, supraclavicular, and axillary nodes normal. Resp: clear to auscultation bilaterally Back: symmetric, no curvature. ROM normal. No CVA tenderness. Cardio: regular rate and rhythm, S1, S2 normal, no murmur, click, rub or gallop GI: soft, non-tender; bowel sounds normal; no masses,  no organomegaly Extremities: extremities normal, atraumatic, no cyanosis or edema  ECOG PERFORMANCE STATUS: 1 - Symptomatic but  completely ambulatory  Blood pressure 106/81, pulse 91, temperature (!) 96.5 F (35.8 C), temperature source Tympanic, resp. rate 18, height 5\' 6"  (1.676 m), weight 188 lb 1.6 oz (85.3 kg), SpO2 98 %.  LABORATORY DATA: Lab Results  Component Value Date   WBC 3.8 (L) 01/22/2021   HGB 13.9 01/22/2021   HCT 39.5 01/22/2021   MCV 91.9 01/22/2021   PLT 263 01/22/2021      Chemistry      Component Value Date/Time   NA 137 01/15/2021 0759   K 4.2 01/15/2021 0759   CL 105 01/15/2021 0759   CO2 25 01/15/2021 0759   BUN 21 01/15/2021 0759   CREATININE 0.89 01/15/2021 0759   CREATININE 0.82 01/13/2021 0854      Component Value Date/Time   CALCIUM 8.8 (L) 01/15/2021 0759   ALKPHOS 88 01/13/2021 0854   AST 23 01/13/2021 0854   ALT 38 01/13/2021 0854   BILITOT 0.3 01/13/2021 0854       RADIOGRAPHIC STUDIES: DG Chest 2 View  Result Date: 01/15/2021 CLINICAL DATA:  65 year old male with history of right-sided chest pain. EXAM: CHEST - 2 VIEW COMPARISON:  Chest x-ray 03/14/2020. FINDINGS: Chronic areas of architectural distortion and volume loss  are again noted in the thorax bilaterally, corresponding to chronic areas of postradiation fibrosis on recent CT examinations. Compared to the prior chest radiograph from 03/14/2020, there is no acute consolidative airspace disease. No pleural effusions. Chronic elevation of the right hemidiaphragm is again noted. No pneumothorax. No evidence of pulmonary edema. Heart size is normal. IMPRESSION: 1. Chronic postradiation changes in the lungs, similar to prior studies, without definite radiographic evidence of acute cardiopulmonary disease. Electronically Signed   By: Vinnie Langton M.D.   On: 01/15/2021 07:23   CT Angio Chest PE W and/or Wo Contrast  Result Date: 01/15/2021 CLINICAL DATA:  Lung carcinoma, history of PE, high prob PE suspected EXAM: CT ANGIOGRAPHY CHEST WITH CONTRAST TECHNIQUE: Multidetector CT imaging of the chest was performed  using the standard protocol during bolus administration of intravenous contrast. Multiplanar CT image reconstructions and MIPs were obtained to evaluate the vascular anatomy. CONTRAST:  37mL OMNIPAQUE IOHEXOL 350 MG/ML SOLN COMPARISON:  10/25/2020 and previous FINDINGS: Cardiovascular: Heart size normal. No pericardial effusion. Mild RV dilatation. Good contrast opacification of pulmonary artery. 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 No new filling defects to suggest acute PE. 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 Coronary calcifications. Adequate contrast opacification of the thoracic aorta with no evidence of dissection, aneurysm, or stenosis. There is classic 3-vessel brachiocephalic arch anatomy without proximal stenosis. Mediastinum/Nodes: Borderline pre-vascular and AP window adenopathy stable. Progressive parenchymal consolidation in the about the right hilum obscures individual lymph nodes. Lungs/Pleura: No pleural effusion. No pneumothorax. Pulmonary emphysema. Perihilar parenchymal consolidation extending medially in the right lower lobe as before, presumably related to radiation treatments. contiguous masslike consolidation in the right suprahilar region and medial right upper lobe,Progressive since 11/27/2020. 2.4 cm nodular recurrence in the right lower lobe (6:68), previously 2.2 cm. Upper Abdomen: 2.8 cm left adrenal adenoma as before. No acute findings. Musculoskeletal: Anterior vertebral endplate spurring at multiple levels in the lower thoracic spine. No fracture or worrisome bone lesion. Review of the MIP images confirms the above findings. IMPRESSION: 1. Negative for acute PE or thoracic aortic dissection. 2. 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 3. Presumed radiation changes about the right hilum with progressive right lung lower lobe nodule and medial right upper lobe disease. 4. Coronary and Aortic  Atherosclerosis (ICD10-170.0). 5.  Emphysema (ICD10-J43.9). Electronically Signed   By: Lucrezia Europe M.D.   On: 01/15/2021 09:34     ASSESSMENT AND PLAN: This is a very pleasant 65 years old white male with a stage IIIc non-small cell lung cancer, adenocarcinoma diagnosed in December 2020 The patient completed a course of concurrent chemoradiation with weekly carboplatin and paclitaxel status post 6 cycles and he tolerated his treatment well and has partial response. He underwent consolidation treatment with immunotherapy with Imfinzi 1500 mg IV every 4 weeks status post 13 cycles. He was on observation and feeling fine with no concerning complaints except for mild cough as well as the left shoulder pain. His scan showed increase in the size of the soft tissue nodule in the right middle lobe adjacent to the postradiation changes highly suspicious for recurrent disease.  There was also soft tissue of the right hilum/as ago esophageal recess unchanged at but increased compared to remote prior studies. The patient had a PET scan that showed hypermetabolic nodule in the right lower lobe adjacent to the post radiation treatment site increased in size and consistent with lung cancer recurrence.  There  was second hypermetabolic nodule in the right lower lobe above the diaphragm concerning for recurrence and hypermetabolic contralateral lymph node in the high left paratracheal mediastinum also concerning for metastatic adenopathy recurrence. He started systemic chemotherapy with carboplatin for AUC of 5, Alimta 500 Mg/M2 and Keytruda 200 Mg IV every 3 weeks on January 01, 2021.  He tolerated the first cycle of his treatment well except for fatigue. I recommended for him to proceed with cycle #2 today as planned. I will see him back for follow-up visit in 3 weeks for evaluation before the next cycle of his treatment. For the history of pulmonary embolism, he will continue on Xarelto. The patient was advised to  call immediately if he has any other concerning symptoms in the interval.  The patient voices understanding of current disease status and treatment options and is in agreement with the current care plan. All questions were answered. The patient knows to call the clinic with any problems, questions or concerns. We can certainly see the patient much sooner if necessary.  Disclaimer: This note was dictated with voice recognition software. Similar sounding words can inadvertently be transcribed and may not be corrected upon review.

## 2021-01-22 NOTE — Progress Notes (Signed)
DATE:  01/22/21                                        X CHEMO/IMMUNOTHERAPY REACTION           MD: Julien Nordmann   AGENT/BLOOD PRODUCT RECEIVING TODAY:              carboplatin, pemetrexed, pembrolizumab   AGENT/BLOOD PRODUCT RECEIVING IMMEDIATELY PRIOR TO REACTION:          carboplatin   VS: BP:     122/94   P:       91       SPO2:       98% 6L                BP:     124/78   P:       84       SPO2:       98   % 3L   REACTION(S):           chest pain   PREMEDS:     Emend 150 mg, dexamethasone 10 mg, palonosetron 0.25 mg   INTERVENTION: Benadryl 50 mg, solumedrol 125 mg, pepcid 20 mg, 1 L IVF, meperidine 12.5 mg   Review of Systems  Review of Systems  Constitutional:  Positive for diaphoresis.  HENT:  Negative for facial swelling.   Eyes:  Negative for visual disturbance.  Respiratory:  Positive for shortness of breath.   Cardiovascular:  Positive for chest pain.  Gastrointestinal:  Negative for nausea and vomiting.  Musculoskeletal:  Negative for arthralgias and myalgias.  Skin:  Positive for color change.  Neurological:  Positive for headaches.     Physical Exam  Physical Exam Vitals and nursing note reviewed.  Constitutional:      General: He is in acute distress.     Appearance: He is well-developed. He is diaphoretic. He is not toxic-appearing.  HENT:     Head: Normocephalic and atraumatic.     Right Ear: External ear normal.     Left Ear: External ear normal.     Nose: Nose normal.  Eyes:     General: No scleral icterus.       Right eye: No discharge.        Left eye: No discharge.     Conjunctiva/sclera: Conjunctivae normal.  Neck:     Vascular: No JVD.  Cardiovascular:     Rate and Rhythm: Normal rate and regular rhythm.     Pulses: Normal pulses.     Heart sounds: Normal heart sounds.  Pulmonary:     Effort: Pulmonary effort is normal.     Breath sounds: Normal breath sounds.     Comments: On nasal cannula for comfort Chest:     Chest wall: No  tenderness.  Abdominal:     General: There is no distension.  Musculoskeletal:        General: Normal range of motion.     Cervical back: Normal range of motion.     Right lower leg: No edema.     Left lower leg: No edema.  Skin:    General: Skin is warm.     Capillary Refill: Capillary refill takes less than 2 seconds.     Findings: Erythema (full body flushing) present.  Neurological:     Mental Status: He is oriented to person, place,  and time.     GCS: GCS eye subscore is 4. GCS verbal subscore is 5. GCS motor subscore is 6.     Comments: Fluent speech, no facial droop.  Psychiatric:        Behavior: Behavior normal.    OUTCOME:                Patient on 8th cycle carboplatin, reaction started 4 minutes into therapy. Patient given hypersensitivity medications benadryl, pepcid, and solumedrol with IVF. Symptoms started to improved. Patient then complaining of chills and has rigors on subsequent exam so 12.5 mg demerol given. Informed Dr. Julien Nordmann of reaction. Carbo discontinued and made an allergy in chart. Patient resting comfortably, VSS. Patient closely monitored by RN. Patient feeling at baseline and discharged home ins stable condition.

## 2021-01-22 NOTE — Patient Instructions (Signed)
[facility]  Discharge Instructions: Thank you for choosing [facility] to provide your oncology and hematology care.   If you have a lab appointment with the [facility], please go directly to the [facility] and check in at the registration area.   Wear comfortable clothing and clothing appropriate for easy access to any Portacath or PICC line.   We strive to give you quality time with your provider. You may need to reschedule your appointment if you arrive late (15 or more minutes).  Arriving late affects you and other patients whose appointments are after yours.  Also, if you miss three or more appointments without notifying the office, you may be dismissed from the clinic at the providers discretion.      For prescription refill requests, have your pharmacy contact our office and allow 72 hours for refills to be completed.    Today you received the following chemotherapy and/or immunotherapy agents Keytruda, Alimta, and Carboplatin      To help prevent nausea and vomiting after your treatment, we encourage you to take your nausea medication as directed.  BELOW ARE SYMPTOMS THAT SHOULD BE REPORTED IMMEDIATELY: *FEVER GREATER THAN 100.4 F (38 C) OR HIGHER *CHILLS OR SWEATING *NAUSEA AND VOMITING THAT IS NOT CONTROLLED WITH YOUR NAUSEA MEDICATION *UNUSUAL SHORTNESS OF BREATH *UNUSUAL BRUISING OR BLEEDING *URINARY PROBLEMS (pain or burning when urinating, or frequent urination) *BOWEL PROBLEMS (unusual diarrhea, constipation, pain near the anus) TENDERNESS IN MOUTH AND THROAT WITH OR WITHOUT PRESENCE OF ULCERS (sore throat, sores in mouth, or a toothache) UNUSUAL RASH, SWELLING OR PAIN  UNUSUAL VAGINAL DISCHARGE OR ITCHING   Items with * indicate a potential emergency and should be followed up as soon as possible or go to the Emergency Department if any problems should occur.  Please show the CHEMOTHERAPY ALERT CARD or IMMUNOTHERAPY  ALERT CARD at check-in to the Emergency Department and triage nurse.  Should you have questions after your visit or need to cancel or reschedule your appointment, please contact [facility]  Dept: [phone]  and follow the prompts.  Office hours are 8:00 a.m. to 4:30 p.m. Monday - Friday. Please note that voicemails left after 4:00 p.m. may not be returned until the following business day.  We are closed weekends and major holidays. You have access to a nurse at all times for urgent questions. Please call the main number to the clinic Dept: [phone] and follow the prompts.   For any non-urgent questions, you may also contact your provider using MyChart. We now offer e-Visits for anyone 39 and older to request care online for non-urgent symptoms. For details visit [email or web address].   Also download the MyChart app! Go to the app store, search "MyChart", open the app, select [facility], and log in with your MyChart username and password.  Due to Covid, a mask is required upon entering the hospital/clinic. If you do not have a mask, one will be given to you upon arrival. For doctor visits, patients may have 1 support person aged 39 or older with them. For treatment visits, patients cannot have anyone with them due to current Covid guidelines and our immunocompromised population.

## 2021-01-29 ENCOUNTER — Other Ambulatory Visit: Payer: Self-pay | Admitting: Physician Assistant

## 2021-01-29 ENCOUNTER — Telehealth: Payer: Self-pay | Admitting: Medical Oncology

## 2021-01-29 ENCOUNTER — Other Ambulatory Visit: Payer: Self-pay

## 2021-01-29 ENCOUNTER — Inpatient Hospital Stay: Payer: No Typology Code available for payment source

## 2021-01-29 VITALS — BP 106/79 | HR 108 | Temp 98.2°F | Resp 20

## 2021-01-29 DIAGNOSIS — C3491 Malignant neoplasm of unspecified part of right bronchus or lung: Secondary | ICD-10-CM

## 2021-01-29 DIAGNOSIS — D709 Neutropenia, unspecified: Secondary | ICD-10-CM

## 2021-01-29 DIAGNOSIS — Z5111 Encounter for antineoplastic chemotherapy: Secondary | ICD-10-CM | POA: Diagnosis not present

## 2021-01-29 LAB — CMP (CANCER CENTER ONLY)
ALT: 47 U/L — ABNORMAL HIGH (ref 0–44)
AST: 35 U/L (ref 15–41)
Albumin: 3.9 g/dL (ref 3.5–5.0)
Alkaline Phosphatase: 95 U/L (ref 38–126)
Anion gap: 8 (ref 5–15)
BUN: 17 mg/dL (ref 8–23)
CO2: 27 mmol/L (ref 22–32)
Calcium: 9.2 mg/dL (ref 8.9–10.3)
Chloride: 103 mmol/L (ref 98–111)
Creatinine: 1.13 mg/dL (ref 0.61–1.24)
GFR, Estimated: >60 mL/min (ref >60)
Glucose, Bld: 122 mg/dL — ABNORMAL HIGH (ref 70–99)
Potassium: 4.4 mmol/L (ref 3.5–5.1)
Sodium: 138 mmol/L (ref 135–145)
Total Bilirubin: 1 mg/dL (ref 0.3–1.2)
Total Protein: 7.4 g/dL (ref 6.5–8.1)

## 2021-01-29 LAB — CBC WITH DIFFERENTIAL (CANCER CENTER ONLY)
Abs Immature Granulocytes: 0.04 10*3/uL (ref 0.00–0.07)
Basophils Absolute: 0 10*3/uL (ref 0.0–0.1)
Basophils Relative: 2 %
Eosinophils Absolute: 0 10*3/uL (ref 0.0–0.5)
Eosinophils Relative: 2 %
HCT: 40.2 % (ref 39.0–52.0)
Hemoglobin: 13.7 g/dL (ref 13.0–17.0)
Immature Granulocytes: 4 %
Lymphocytes Relative: 43 %
Lymphs Abs: 0.5 10*3/uL — ABNORMAL LOW (ref 0.7–4.0)
MCH: 31.1 pg (ref 26.0–34.0)
MCHC: 34.1 g/dL (ref 30.0–36.0)
MCV: 91.4 fL (ref 80.0–100.0)
Monocytes Absolute: 0.1 10*3/uL (ref 0.1–1.0)
Monocytes Relative: 6 %
Neutro Abs: 0.5 10*3/uL — ABNORMAL LOW (ref 1.7–7.7)
Neutrophils Relative %: 43 %
Platelet Count: 177 10*3/uL (ref 150–400)
RBC: 4.4 MIL/uL (ref 4.22–5.81)
RDW: 12.4 % (ref 11.5–15.5)
Smear Review: NORMAL
WBC Count: 1.1 10*3/uL — ABNORMAL LOW (ref 4.0–10.5)
nRBC: 0 % (ref 0.0–0.2)

## 2021-01-29 LAB — TSH: TSH: 2.263 u[IU]/mL (ref 0.320–4.118)

## 2021-01-29 MED ORDER — FILGRASTIM-SNDZ 300 MCG/0.5ML IJ SOSY
300.0000 ug | PREFILLED_SYRINGE | Freq: Once | INTRAMUSCULAR | Status: AC
  Administered 2021-01-29: 12:00:00 300 ug via SUBCUTANEOUS
  Filled 2021-01-29: qty 0.5

## 2021-01-29 NOTE — Patient Instructions (Signed)
Filgrastim, G-CSF injection What is this medication? FILGRASTIM, G-CSF (fil GRA stim) is a granulocyte colony-stimulating factor that stimulates the growth of neutrophils, a type of white blood cell (WBC) important in the body's fight against infection. It is used to reduce the incidence of fever and infection in patients with certain types of cancer who are receiving chemotherapy that affects the bone marrow, to stimulate blood cell production for removal of WBCs from the body prior to a bone marrow transplantation, to reduce the incidence of fever and infection in patients who have severe chronic neutropenia, and to improve survival outcomes following high-dose radiation exposure that is toxic to the bone marrow. This medicine may be used for other purposes; ask your health care provider or pharmacist if you have questions. COMMON BRAND NAME(S): Neupogen, Nivestym, Releuko, Zarxio What should I tell my care team before I take this medication? They need to know if you have any of these conditions: kidney disease latex allergy ongoing radiation therapy sickle cell disease an unusual or allergic reaction to filgrastim, pegfilgrastim, other medicines, foods, dyes, or preservatives pregnant or trying to get pregnant breast-feeding How should I use this medication? This medicine is for injection under the skin or infusion into a vein. As an infusion into a vein, it is usually given by a health care professional in a hospital or clinic setting. If you get this medicine at home, you will be taught how to prepare and give this medicine. Refer to the Instructions for Use that come with your medication packaging. Use exactly as directed. Take your medicine at regular intervals. Do not take your medicine more often than directed. It is important that you put your used needles and syringes in a special sharps container. Do not put them in a trash can. If you do not have a sharps container, call your pharmacist  or healthcare provider to get one. Talk to your pediatrician regarding the use of this medicine in children. While this drug may be prescribed for children as young as 7 months for selected conditions, precautions do apply. Overdosage: If you think you have taken too much of this medicine contact a poison control center or emergency room at once. NOTE: This medicine is only for you. Do not share this medicine with others. What if I miss a dose? It is important not to miss your dose. Call your doctor or health care professional if you miss a dose. What may interact with this medication? This medicine may interact with the following medications: medicines that may cause a release of neutrophils, such as lithium This list may not describe all possible interactions. Give your health care provider a list of all the medicines, herbs, non-prescription drugs, or dietary supplements you use. Also tell them if you smoke, drink alcohol, or use illegal drugs. Some items may interact with your medicine. What should I watch for while using this medication? Your condition will be monitored carefully while you are receiving this medicine. You may need blood work done while you are taking this medicine. Talk to your health care provider about your risk of cancer. You may be more at risk for certain types of cancer if you take this medicine. What side effects may I notice from receiving this medication? Side effects that you should report to your doctor or health care professional as soon as possible: allergic reactions like skin rash, itching or hives, swelling of the face, lips, or tongue back pain dizziness or feeling faint fever pain, redness, or  irritation at site where injected pinpoint red spots on the skin shortness of breath or breathing problems signs and symptoms of kidney injury like trouble passing urine, change in the amount of urine, or red or dark-brown urine stomach or side pain, or pain at  the shoulder swelling tiredness unusual bleeding or bruising Side effects that usually do not require medical attention (report to your doctor or health care professional if they continue or are bothersome): bone pain cough diarrhea hair loss headache muscle pain This list may not describe all possible side effects. Call your doctor for medical advice about side effects. You may report side effects to FDA at 1-800-FDA-1088. Where should I keep my medication? Keep out of the reach of children. Store in a refrigerator between 2 and 8 degrees C (36 and 46 degrees F). Do not freeze. Keep in carton to protect from light. Throw away this medicine if vials or syringes are left out of the refrigerator for more than 24 hours. Throw away any unused medicine after the expiration date. NOTE: This sheet is a summary. It may not cover all possible information. If you have questions about this medicine, talk to your doctor, pharmacist, or health care provider.  2022 Elsevier/Gold Standard (2020-10-08 00:00:00)

## 2021-01-29 NOTE — Telephone Encounter (Signed)
He is a  walk in that has new boils on his arm . He is asking to be seen. [place] instructed pt to contact PCP.  I instructed him to contact PCP.   Pt ANC 0.5 and Cassie is ordering GCSF. Pt notified that she is trying to get this authorized by his insurance.

## 2021-01-29 NOTE — Telephone Encounter (Signed)
Pt notified that he is approved for GCSF today and to register.

## 2021-01-30 ENCOUNTER — Inpatient Hospital Stay: Payer: No Typology Code available for payment source

## 2021-01-30 ENCOUNTER — Encounter: Payer: Self-pay | Admitting: Internal Medicine

## 2021-01-30 VITALS — BP 110/84 | HR 105 | Temp 97.9°F | Resp 20

## 2021-01-30 DIAGNOSIS — Z5111 Encounter for antineoplastic chemotherapy: Secondary | ICD-10-CM | POA: Diagnosis not present

## 2021-01-30 DIAGNOSIS — D709 Neutropenia, unspecified: Secondary | ICD-10-CM

## 2021-01-30 MED ORDER — FILGRASTIM-SNDZ 300 MCG/0.5ML IJ SOSY
300.0000 ug | PREFILLED_SYRINGE | Freq: Once | INTRAMUSCULAR | Status: AC
  Administered 2021-01-30: 09:00:00 300 ug via SUBCUTANEOUS
  Filled 2021-01-30: qty 0.5

## 2021-01-30 NOTE — Addendum Note (Signed)
Addended by: Tora Kindred on: 01/30/2021 12:52 PM   Modules accepted: Orders

## 2021-01-31 ENCOUNTER — Other Ambulatory Visit: Payer: Self-pay

## 2021-01-31 ENCOUNTER — Inpatient Hospital Stay: Payer: No Typology Code available for payment source

## 2021-01-31 VITALS — BP 121/82 | HR 94 | Temp 97.5°F | Resp 20

## 2021-01-31 DIAGNOSIS — D709 Neutropenia, unspecified: Secondary | ICD-10-CM

## 2021-01-31 DIAGNOSIS — Z5111 Encounter for antineoplastic chemotherapy: Secondary | ICD-10-CM | POA: Diagnosis not present

## 2021-01-31 MED ORDER — FILGRASTIM-SNDZ 300 MCG/0.5ML IJ SOSY
300.0000 ug | PREFILLED_SYRINGE | Freq: Once | INTRAMUSCULAR | Status: AC
  Administered 2021-01-31: 09:00:00 300 ug via SUBCUTANEOUS
  Filled 2021-01-31: qty 0.5

## 2021-02-02 ENCOUNTER — Other Ambulatory Visit: Payer: Self-pay | Admitting: Internal Medicine

## 2021-02-02 DIAGNOSIS — R1310 Dysphagia, unspecified: Secondary | ICD-10-CM

## 2021-02-04 ENCOUNTER — Encounter: Payer: Self-pay | Admitting: Physician Assistant

## 2021-02-04 ENCOUNTER — Encounter (HOSPITAL_BASED_OUTPATIENT_CLINIC_OR_DEPARTMENT_OTHER): Payer: Self-pay

## 2021-02-04 ENCOUNTER — Emergency Department (HOSPITAL_BASED_OUTPATIENT_CLINIC_OR_DEPARTMENT_OTHER)
Admission: EM | Admit: 2021-02-04 | Discharge: 2021-02-04 | Disposition: A | Discharge status: Home / Self Care | Payer: No Typology Code available for payment source | Attending: Emergency Medicine | Admitting: Emergency Medicine

## 2021-02-04 ENCOUNTER — Telehealth: Payer: Self-pay | Admitting: Medical Oncology

## 2021-02-04 ENCOUNTER — Encounter: Payer: Self-pay | Admitting: Internal Medicine

## 2021-02-04 ENCOUNTER — Other Ambulatory Visit: Payer: Self-pay

## 2021-02-04 ENCOUNTER — Other Ambulatory Visit: Payer: Self-pay | Admitting: Critical Care Medicine

## 2021-02-04 DIAGNOSIS — K651 Peritoneal abscess: Secondary | ICD-10-CM | POA: Diagnosis not present

## 2021-02-04 DIAGNOSIS — L0291 Cutaneous abscess, unspecified: Secondary | ICD-10-CM

## 2021-02-04 LAB — CBC WITH DIFFERENTIAL/PLATELET
Band Neutrophils: 1 %
Basophils Relative: 0 %
Blasts: 0 %
Eosinophils Relative: 0 %
HCT: 36.6 % — ABNORMAL LOW (ref 39.0–52.0)
Hemoglobin: 12.3 g/dL — ABNORMAL LOW (ref 13.0–17.0)
Immature Granulocytes: 0 %
Lymphocytes Relative: 31 %
MCH: 31.5 pg (ref 26.0–34.0)
MCHC: 33.6 g/dL (ref 30.0–36.0)
MCV: 93.6 fL (ref 80.0–100.0)
Metamyelocytes Relative: 1 %
Monocytes Relative: 16 %
Myelocytes: 2 %
Neutrophils Relative %: 49 %
Platelets: 147 10*3/uL — ABNORMAL LOW (ref 150–400)
Promyelocytes Relative: 0 %
RBC: 3.91 MIL/uL — ABNORMAL LOW (ref 4.22–5.81)
RDW: 13.6 % (ref 11.5–15.5)
WBC: 3.2 10*3/uL — ABNORMAL LOW (ref 4.0–10.5)
nRBC: 0 % (ref 0.0–0.2)
nRBC: 0 /100 WBC

## 2021-02-04 LAB — BASIC METABOLIC PANEL
Anion gap: 6 (ref 5–15)
BUN: 14 mg/dL (ref 8–23)
CO2: 29 mmol/L (ref 22–32)
Calcium: 8.8 mg/dL — ABNORMAL LOW (ref 8.9–10.3)
Chloride: 102 mmol/L (ref 98–111)
Creatinine, Ser: 1.08 mg/dL (ref 0.61–1.24)
GFR, Estimated: >60 mL/min (ref >60)
Glucose, Bld: 84 mg/dL (ref 70–99)
Potassium: 4.4 mmol/L (ref 3.5–5.1)
Sodium: 137 mmol/L (ref 135–145)

## 2021-02-04 MED ORDER — LIDOCAINE-EPINEPHRINE (PF) 2 %-1:200000 IJ SOLN
10.0000 mL | Freq: Once | INTRAMUSCULAR | Status: AC
  Administered 2021-02-04: 10 mL via INTRADERMAL
  Filled 2021-02-04: qty 20

## 2021-02-04 MED ORDER — DIAZEPAM 2 MG PO TABS
2.0000 mg | ORAL_TABLET | Freq: Once | ORAL | Status: AC
Start: 2021-02-04 — End: 2021-02-04
  Administered 2021-02-04: 2 mg via ORAL
  Filled 2021-02-04: qty 1

## 2021-02-04 MED ORDER — DOXYCYCLINE HYCLATE 100 MG PO TABS
100.0000 mg | ORAL_TABLET | Freq: Once | ORAL | Status: AC
  Administered 2021-02-04: 100 mg via ORAL
  Filled 2021-02-04: qty 1

## 2021-02-04 MED ORDER — ACETAMINOPHEN 500 MG PO TABS
1000.0000 mg | ORAL_TABLET | Freq: Once | ORAL | Status: AC
  Administered 2021-02-04: 1000 mg via ORAL
  Filled 2021-02-04: qty 2

## 2021-02-04 MED ORDER — DOXYCYCLINE HYCLATE 100 MG PO CAPS: 100.0000 mg | ORAL_CAPSULE | Freq: Two times a day (BID) | ORAL | 0 refills | Status: DC

## 2021-02-04 MED ORDER — OXYCODONE HCL 5 MG PO TABS
5.0000 mg | ORAL_TABLET | Freq: Once | ORAL | Status: AC
  Administered 2021-02-04: 5 mg via ORAL
  Filled 2021-02-04: qty 1

## 2021-02-04 NOTE — ED Triage Notes (Signed)
Patient here POV from Home with Pilonidal Cyst.  Patient has had present for approximately 2 weeks and has worsened since.  Patient is receiving Current Chemotherapy for Lung Cancer. No Fevers. Mild Drainage noted.   NAD Noted during Triage. A&Ox4. GCS 15. Ambulatory.

## 2021-02-04 NOTE — ED Provider Notes (Signed)
[name] Provider Note   CSN: [id number] Arrival date & time: 02/04/21  1233     History  Chief Complaint  Patient presents with   Abscess    Philip [name] is a 66 y.o. male.  66 yo M with a chief complaints of multiple abscesses.  He has been getting these recently as he has resumed chemotherapy for lung cancer.  He was started on antibiotics about 3 or 4 weeks ago with some improvements of abscesses in one region of his body but if developed different and another.  He has felt subjectively hot and cold.  He has an abscess to the base of his buttock that he is mostly concerned about.  Has never had one there before.  Denies any issue with his rectum.  The history is provided by the patient.  Abscess Location:  Pelvis Pelvic abscess location:  Gluteal cleft Size:  Quarter Abscess quality: draining, fluctuance, painful and redness   Red streaking: no   Duration:  3 days Progression:  Worsening Associated symptoms: no fever, no headaches and no vomiting       Home Medications Prior to Admission medications   Medication Sig Start Date End Date Taking? Authorizing Provider  doxycycline (VIBRAMYCIN) 100 MG capsule Take 1 capsule (100 mg total) by mouth 2 (two) times daily. One po bid x 7 days 02/04/21  Yes Deno Etienne, DO  albuterol (VENTOLIN HFA) 108 (90 Base) MCG/ACT inhaler Inhale 2 puffs into the lungs every 4 (four) hours as needed for wheezing or shortness of breath. Patient not taking: Reported on 08/20/2020 06/27/19   [name], DO  atorvastatin (LIPITOR) 20 MG tablet Take 1 tablet by mouth daily.    [provider]  dextromethorphan-guaiFENesin (MUCINEX DM) 30-600 MG 12hr tablet Take 1 tablet by mouth 2 (two) times daily as needed for cough. Patient not taking: Reported on 08/20/2020 03/17/20   [name], MD  famotidine (PEPCID) 20 MG tablet TAKE 1 TABLET BY MOUTH TWICE A DAY 02/04/21   Heilingoetter, [name], [name]   fexofenadine (ALLEGRA) 180 MG tablet Take 180 mg by mouth daily.    [provider]  folic acid (FOLVITE) 1 MG tablet Take 1 tablet (1 mg total) by mouth daily. 12/20/20   [name], MD  HYDROcodone-acetaminophen (NORCO) 5-325 MG tablet Take 1 tablet by mouth every 6 (six) hours as needed for moderate pain. Patient not taking: Reported on 01/22/2021 01/08/21   Barrie Folk, [name]  naphazoline-pheniramine (VISINE) 0.025-0.3 % ophthalmic solution 1 drop into both eyes as needed for red eye    [provider]  prochlorperazine (COMPAZINE) 10 MG tablet Take 1 tablet (10 mg total) by mouth every 6 (six) hours as needed for nausea or vomiting. 12/20/20   [name], MD  Tiotropium Bromide-Olodaterol (STIOLTO RESPIMAT) 2.5-2.5 MCG/ACT AERS Inhale 2 puffs into the lungs daily. 01/15/20   [name], DO  XARELTO 20 MG TABS tablet SMARTSIG:1 Tablet(s) By Mouth Every Evening 12/05/20   [provider]      Allergies    Carboplatin, Clonazepam, and Norco [hydrocodone-acetaminophen]    Review of Systems   Review of Systems  Constitutional:  Negative for chills and fever.  HENT:  Negative for congestion and facial swelling.   Eyes:  Negative for discharge and visual disturbance.  Respiratory:  Negative for shortness of breath.   Cardiovascular:  Negative for chest pain and palpitations.  Gastrointestinal:  Negative for abdominal pain, diarrhea and  vomiting.  Musculoskeletal:  Negative for arthralgias and myalgias.  Skin:  Positive for color change and wound. Negative for rash.  Neurological:  Negative for tremors, syncope and headaches.  Psychiatric/Behavioral:  Negative for confusion and dysphoric mood.    Physical Exam Updated Vital Signs BP 105/71    Pulse 84    Temp 98 F (36.7 C) (Oral)    Resp 13    Ht 5\' 6"  (1.676 m)    Wt 85.3 kg    SpO2 94%    BMI 30.35 kg/m  Physical Exam Vitals and nursing note reviewed.  Constitutional:       Appearance: He is well-developed.  HENT:     Head: Normocephalic and atraumatic.  Eyes:     Pupils: Pupils are equal, round, and reactive to light.  Neck:     Vascular: No JVD.  Cardiovascular:     Rate and Rhythm: Normal rate and regular rhythm.     Heart sounds: No murmur heard.   No friction rub. No gallop.  Pulmonary:     Effort: No respiratory distress.     Breath sounds: No wheezing.  Abdominal:     General: There is no distension.     Tenderness: There is no abdominal tenderness. There is no guarding or rebound.  Musculoskeletal:        General: Normal range of motion.     Cervical back: Normal range of motion and neck supple.     Comments: Chain of abscesses in different stages along the left axillary region.  Right base of the buttock abscess adjacent to the pilonidal region with some erosion centrally with some granulation tissue versus purulent drainage.  Some surrounding induration and fluctuance.  Skin:    Coloration: Skin is not pale.     Findings: No rash.  Neurological:     Mental Status: He is alert and oriented to person, place, and time.  Psychiatric:        Behavior: Behavior normal.    ED Results / Procedures / Treatments   Labs (all labs ordered are listed, but only abnormal results are displayed) Labs Reviewed  CBC WITH DIFFERENTIAL/PLATELET - Abnormal; Notable for the following components:      Result Value   WBC 3.2 (*)    RBC 3.91 (*)    Hemoglobin 12.3 (*)    HCT 36.6 (*)    Platelets 147 (*)    All other components within normal limits  BASIC METABOLIC PANEL - Abnormal; Notable for the following components:   Calcium 8.8 (*)    All other components within normal limits  CULTURE, BLOOD (ROUTINE X 2)  CULTURE, BLOOD (ROUTINE X 2)    EKG None  Radiology No results found.  Procedures .Marland KitchenIncision and Drainage  Date/Time: 02/04/2021 8:33 PM Performed by: Deno Etienne, DO Authorized by: Deno Etienne, DO   Consent:    Consent obtained:   Verbal   Consent given by:  Patient   Risks, benefits, and alternatives were discussed: yes     Risks discussed:  Bleeding, incomplete drainage and infection   Alternatives discussed:  No treatment, delayed treatment and alternative treatment Universal protocol:    Procedure explained and questions answered to patient or proxy's satisfaction: yes     Immediately prior to procedure, a time out was called: yes     Patient identity confirmed:  Verbally with patient Location:    Type:  Abscess   Size:  Quarter   Location:  Anogenital  Anogenital location:  Perirectal Pre-procedure details:    Skin preparation:  Chlorhexidine Sedation:    Sedation type:  None Anesthesia:    Anesthesia method:  None Procedure type:    Complexity:  Complex Procedure details:    Ultrasound guidance: no     Needle aspiration: no     Incision types:  Single straight   Incision depth:  Subcutaneous   Wound management:  Probed and deloculated   Drainage:  Bloody and purulent   Drainage amount:  Moderate   Wound treatment:  Wound left open   Packing materials:  None Post-procedure details:    Procedure completion:  Tolerated well, no immediate complications    Medications Ordered in ED Medications  acetaminophen (TYLENOL) tablet 1,000 mg (1,000 mg Oral Given 02/04/21 1850)  oxyCODONE (Oxy IR/ROXICODONE) immediate release tablet 5 mg (5 mg Oral Given 02/04/21 1851)  diazepam (VALIUM) tablet 2 mg (2 mg Oral Given 02/04/21 1851)  lidocaine-EPINEPHrine (XYLOCAINE W/EPI) 2 %-1:200000 (PF) injection 10 mL (10 mLs Intradermal Given by Other 02/04/21 2027)  doxycycline (VIBRA-TABS) tablet 100 mg (100 mg Oral Given 02/04/21 1851)    ED Course/ Medical Decision Making/ A&P                           Medical Decision Making  66 yo M with a chief complaints of an abscess to his buttock.  Patient is ongoing chemotherapy and has had development of these abscesses.  Question bacteremia though afebrile here.  We will send  off blood cultures.  Check blood counts to evaluate for neutropenia.  Will I&D at bedside.  Started on antibiotics.  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  Will give the patient information to follow-up with general surgery.  Patient white count at 3.2 a bit higher than it had been recently.  No significant electrolyte abnormality no significant anemia.  8:35 PM:  I have discussed the diagnosis/risks/treatment options with the patient and believe the pt to be eligible for discharge home to follow-up with PCP, Gen surgery, oncology. We also discussed returning to the ED immediately if new or worsening sx occur. We discussed the sx which are most concerning (e.g., sudden worsening pain, fever, inability to tolerate by mouth) that necessitate immediate return. Medications administered to the patient during their visit and any new prescriptions provided to the patient are listed below.  Medications given during this visit Medications  acetaminophen (TYLENOL) tablet 1,000 mg (1,000 mg Oral Given 02/04/21 1850)  oxyCODONE (Oxy IR/ROXICODONE) immediate release tablet 5 mg (5 mg Oral Given 02/04/21 1851)  diazepam (VALIUM) tablet 2 mg (2 mg Oral Given 02/04/21 1851)  lidocaine-EPINEPHrine (XYLOCAINE W/EPI) 2 %-1:200000 (PF) injection 10 mL (10 mLs Intradermal Given by Other 02/04/21 2027)  doxycycline (VIBRA-TABS) tablet 100 mg (100 mg Oral Given 02/04/21 1851)     The patient appears reasonably screen and/or stabilized for discharge and I doubt any other medical condition or other [facility] requiring further screening, evaluation, or treatment in the ED at this time prior to discharge.            Final Clinical Impression(s) / ED Diagnoses Final diagnoses:  Abscess    Rx / DC Orders ED Discharge Orders          Ordered    doxycycline (VIBRAMYCIN) 100 MG capsule  2 times daily        02/04/21 2029  Deno Etienne, DO 02/04/21  2035

## 2021-02-04 NOTE — Telephone Encounter (Signed)
Pilonidal cyst diagnosed today . I told Murel to keep lab appt tomorrow unless the ED can draw them today.

## 2021-02-04 NOTE — Discharge Instructions (Signed)
Warm compresses at least 4 times a day.  Please return for rapid spreading redness or if you develop a fever.  Follow-up with your oncologist family doctor.  I have given you information to follow-up with the general surgeon.  Lesions in this area sometimes will benefit from a surgical procedure.

## 2021-02-05 ENCOUNTER — Other Ambulatory Visit: Payer: No Typology Code available for payment source

## 2021-02-07 ENCOUNTER — Other Ambulatory Visit: Payer: Self-pay | Admitting: Critical Care Medicine

## 2021-02-09 LAB — CULTURE, BLOOD (ROUTINE X 2)
Culture: NO GROWTH
Culture: NO GROWTH
Special Requests: ADEQUATE
Special Requests: ADEQUATE

## 2021-02-11 ENCOUNTER — Inpatient Hospital Stay: Payer: No Typology Code available for payment source | Attending: Internal Medicine

## 2021-02-11 ENCOUNTER — Other Ambulatory Visit: Payer: Self-pay

## 2021-02-11 ENCOUNTER — Inpatient Hospital Stay: Payer: No Typology Code available for payment source

## 2021-02-11 ENCOUNTER — Encounter: Payer: Self-pay | Admitting: Internal Medicine

## 2021-02-11 ENCOUNTER — Inpatient Hospital Stay (HOSPITAL_BASED_OUTPATIENT_CLINIC_OR_DEPARTMENT_OTHER): Payer: No Typology Code available for payment source | Admitting: Internal Medicine

## 2021-02-11 VITALS — BP 105/79 | HR 81 | Temp 96.8°F | Resp 17 | Wt 186.7 lb

## 2021-02-11 DIAGNOSIS — Z85118 Personal history of other malignant neoplasm of bronchus and lung: Secondary | ICD-10-CM | POA: Diagnosis not present

## 2021-02-11 DIAGNOSIS — Z5111 Encounter for antineoplastic chemotherapy: Secondary | ICD-10-CM | POA: Diagnosis present

## 2021-02-11 DIAGNOSIS — Z79899 Other long term (current) drug therapy: Secondary | ICD-10-CM | POA: Diagnosis not present

## 2021-02-11 DIAGNOSIS — Z7901 Long term (current) use of anticoagulants: Secondary | ICD-10-CM | POA: Insufficient documentation

## 2021-02-11 DIAGNOSIS — C3490 Malignant neoplasm of unspecified part of unspecified bronchus or lung: Secondary | ICD-10-CM | POA: Diagnosis not present

## 2021-02-11 DIAGNOSIS — Z923 Personal history of irradiation: Secondary | ICD-10-CM | POA: Diagnosis not present

## 2021-02-11 DIAGNOSIS — C3431 Malignant neoplasm of lower lobe, right bronchus or lung: Secondary | ICD-10-CM | POA: Diagnosis present

## 2021-02-11 DIAGNOSIS — Z9221 Personal history of antineoplastic chemotherapy: Secondary | ICD-10-CM | POA: Diagnosis not present

## 2021-02-11 DIAGNOSIS — Z5112 Encounter for antineoplastic immunotherapy: Secondary | ICD-10-CM | POA: Insufficient documentation

## 2021-02-11 DIAGNOSIS — C3491 Malignant neoplasm of unspecified part of right bronchus or lung: Secondary | ICD-10-CM

## 2021-02-11 DIAGNOSIS — Z86711 Personal history of pulmonary embolism: Secondary | ICD-10-CM | POA: Diagnosis not present

## 2021-02-11 LAB — CMP (CANCER CENTER ONLY)
ALT: 26 U/L (ref 0–44)
AST: 22 U/L (ref 15–41)
Albumin: 3.7 g/dL (ref 3.5–5.0)
Alkaline Phosphatase: 84 U/L (ref 38–126)
Anion gap: 8 (ref 5–15)
BUN: 18 mg/dL (ref 8–23)
CO2: 24 mmol/L (ref 22–32)
Calcium: 9.3 mg/dL (ref 8.9–10.3)
Chloride: 107 mmol/L (ref 98–111)
Creatinine: 0.93 mg/dL (ref 0.61–1.24)
GFR, Estimated: >60 mL/min (ref >60)
Glucose, Bld: 101 mg/dL — ABNORMAL HIGH (ref 70–99)
Potassium: 4.2 mmol/L (ref 3.5–5.1)
Sodium: 139 mmol/L (ref 135–145)
Total Bilirubin: 0.4 mg/dL (ref 0.3–1.2)
Total Protein: 7.2 g/dL (ref 6.5–8.1)

## 2021-02-11 LAB — TSH: TSH: 1.745 u[IU]/mL (ref 0.320–4.118)

## 2021-02-11 LAB — CBC WITH DIFFERENTIAL (CANCER CENTER ONLY)
Abs Immature Granulocytes: 0.08 10*3/uL — ABNORMAL HIGH (ref 0.00–0.07)
Basophils Absolute: 0 10*3/uL (ref 0.0–0.1)
Basophils Relative: 1 %
Eosinophils Absolute: 0 10*3/uL (ref 0.0–0.5)
Eosinophils Relative: 1 %
HCT: 37 % — ABNORMAL LOW (ref 39.0–52.0)
Hemoglobin: 12.7 g/dL — ABNORMAL LOW (ref 13.0–17.0)
Immature Granulocytes: 2 %
Lymphocytes Relative: 20 %
Lymphs Abs: 1.1 10*3/uL (ref 0.7–4.0)
MCH: 32 pg (ref 26.0–34.0)
MCHC: 34.3 g/dL (ref 30.0–36.0)
MCV: 93.2 fL (ref 80.0–100.0)
Monocytes Absolute: 0.7 10*3/uL (ref 0.1–1.0)
Monocytes Relative: 13 %
Neutro Abs: 3.5 10*3/uL (ref 1.7–7.7)
Neutrophils Relative %: 63 %
Platelet Count: 424 10*3/uL — ABNORMAL HIGH (ref 150–400)
RBC: 3.97 MIL/uL — ABNORMAL LOW (ref 4.22–5.81)
RDW: 14.7 % (ref 11.5–15.5)
WBC Count: 5.4 10*3/uL (ref 4.0–10.5)
nRBC: 0 % (ref 0.0–0.2)

## 2021-02-11 MED ORDER — SODIUM CHLORIDE 0.9 % IV SOLN
200.0000 mg | Freq: Once | INTRAVENOUS | Status: AC
  Administered 2021-02-11: 200 mg via INTRAVENOUS
  Filled 2021-02-11: qty 8

## 2021-02-11 MED ORDER — CYANOCOBALAMIN 1000 MCG/ML IJ SOLN
1000.0000 ug | Freq: Once | INTRAMUSCULAR | Status: AC
  Administered 2021-02-11: 1000 ug via INTRAMUSCULAR
  Filled 2021-02-11: qty 1

## 2021-02-11 MED ORDER — PROCHLORPERAZINE MALEATE 10 MG PO TABS
10.0000 mg | ORAL_TABLET | Freq: Once | ORAL | Status: AC
  Administered 2021-02-11: 10 mg via ORAL
  Filled 2021-02-11: qty 1

## 2021-02-11 MED ORDER — SODIUM CHLORIDE 0.9 % IV SOLN
500.0000 mg/m2 | Freq: Once | INTRAVENOUS | Status: AC
  Administered 2021-02-11: 1000 mg via INTRAVENOUS
  Filled 2021-02-11 (×3): qty 40

## 2021-02-11 MED ORDER — SODIUM CHLORIDE 0.9 % IV SOLN: Freq: Once | INTRAVENOUS | Status: AC

## 2021-02-11 NOTE — Patient Instructions (Signed)
Steps to Quit Smoking Smoking tobacco is the leading cause of preventable death. It can affect almost every organ in the body. Smoking puts you and people around you at risk for many serious, long-lasting (chronic) diseases. Quitting smoking can be hard, but it is one of the best things that you can do for your health. It is never too late to quit. How do I get ready to quit? When you decide to quit smoking, make a plan to help you succeed. Before you quit: Pick a date to quit. Set a date within the next 2 weeks to give you time to prepare. Write down the reasons why you are quitting. Keep this list in places where you will see it often. Tell your family, friends, and co-workers that you are quitting. Their support is important. Talk with your doctor about the choices that may help you quit. Find out if your health insurance will pay for these treatments. Know the people, places, things, and activities that make you want to smoke (triggers). Avoid them. What first steps can I take to quit smoking? Throw away all cigarettes at home, at work, and in your car. Throw away the things that you use when you smoke, such as ashtrays and lighters. Clean your car. Make sure to empty the ashtray. Clean your home, including curtains and carpets. What can I do to help me quit smoking? Talk with your doctor about taking medicines and seeing a counselor at the same time. You are more likely to succeed when you do both. If you are pregnant or breastfeeding, talk with your doctor about counseling or other ways to quit smoking. Do not take medicine to help you quit smoking unless your doctor tells you to do so. To quit smoking: Quit right away Quit smoking totally, instead of slowly cutting back on how much you smoke over a period of time. Go to counseling. You are more likely to quit if you go to counseling sessions regularly. Take medicine You may take medicines to help you quit. Some medicines need a  prescription, and some you can buy over-the-counter. Some medicines may contain a drug called nicotine to replace the nicotine in cigarettes. Medicines may: Help you to stop having the desire to smoke (cravings). Help to stop the problems that come when you stop smoking (withdrawal symptoms). Your doctor may ask you to use: Nicotine patches, gum, or lozenges. Nicotine inhalers or sprays. Non-nicotine medicine that is taken by mouth. Find resources Find resources and other ways to help you quit smoking and remain smoke-free after you quit. These resources are most helpful when you use them often. They include: Online chats with a counselor. Phone quitlines. Printed self-help materials. Support groups or group counseling. Text messaging programs. Mobile phone apps. Use apps on your mobile phone or tablet that can help you stick to your quit plan. There are many free apps for mobile phones and tablets as well as websites. Examples include Quit Guide from the CDC and smokefree.gov  What things can I do to make it easier to quit?  Talk to your family and friends. Ask them to support and encourage you. Call a phone quitline (1-800-QUIT-NOW), reach out to support groups, or work with a counselor. Ask people who smoke to not smoke around you. Avoid places that make you want to smoke, such as: Bars. Parties. Smoke-break areas at work. Spend time with people who do not smoke. Lower the stress in your life. Stress can make you want to   smoke. Try these things to help your stress: Getting regular exercise. Doing deep-breathing exercises. Doing yoga. Meditating. Doing a body scan. To do this, close your eyes, focus on one area of your body at a time from head to toe. Notice which parts of your body are tense. Try to relax the muscles in those areas. How will I feel when I quit smoking? Day 1 to 3 weeks Within the first 24 hours, you may start to have some problems that come from quitting tobacco.  These problems are very bad 2-3 days after you quit, but they do not often last for more than 2-3 weeks. You may get these symptoms: Mood swings. Feeling restless, nervous, angry, or annoyed. Trouble concentrating. Dizziness. Strong desire for high-sugar foods and nicotine. Weight gain. Trouble pooping (constipation). Feeling like you may vomit (nausea). Coughing or a sore throat. Changes in how the medicines that you take for other issues work in your body. Depression. Trouble sleeping (insomnia). Week 3 and afterward After the first 2-3 weeks of quitting, you may start to notice more positive results, such as: Better sense of smell and taste. Less coughing and sore throat. Slower heart rate. Lower blood pressure. Clearer skin. Better breathing. Fewer sick days. Quitting smoking can be hard. Do not give up if you fail the first time. Some people need to try a few times before they succeed. Do your best to stick to your quit plan, and talk with your doctor if you have any questions or concerns. Summary Smoking tobacco is the leading cause of preventable death. Quitting smoking can be hard, but it is one of the best things that you can do for your health. When you decide to quit smoking, make a plan to help you succeed. Quit smoking right away, not slowly over a period of time. When you start quitting, seek help from your doctor, family, or friends. This information is not intended to replace advice given to you by your health care provider. Make sure you discuss any questions you have with your health care provider. Document Revised: 09/27/2020 Document Reviewed: 04/09/2018 [employer] Patient Education  2022 [employer]  

## 2021-02-11 NOTE — Progress Notes (Signed)
[place] Telephone:([phone]) [phone]   Fax:([phone]  OFFICE PROGRESS NOTE  Philip Pepper, MD [place] [address] [address]  DIAGNOSIS:  1) recurrent non-small cell lung cancer initially diagnosed as stage IIIc (T4, N3, M0) non-small cell lung cancer, adenocarcinoma presented with large right hilar mass in addition to bilateral hilar and mediastinal lymphadenopathy diagnosed in December 2020.  The patient has evidence for disease recurrence in October 2022. 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  Started on Xarelto on March 17, 2020 and currently 20 mg p.o. daily.   PRIOR THERAPY:  1) Weekly concurrent chemoradiation with carboplatin for an AUC of 2 and paclitaxel 45 mg/m2. First dose starting 01/30/2019.   Status post 6 cycles. 2) Consolidation immunotherapy with Imfinzi 1500 mg IV every 4 weeks. First dose expected on 04/19/2019.  Status post 13 cycles. 3) disease recurrence in October 2022.   CURRENT THERAPY: Systemic chemotherapy with carboplatin for AUC of 5, Alimta 500 Mg/M2 and Keytruda 200 Mg IV every 3 weeks.  First dose January 01, 2021.  Status post 2 cycles.  He had hypersensitivity reaction to carboplatin and this was discontinued after cycle #2.  INTERVAL HISTORY: Philip [name] 66 y.o. male returns to the clinic today for follow-up visit.  The patient is feeling fine today with no concerning complaints except for fatigue.  He spent more time at home these days.  He used to be very active but stays home a lot during the cold weather.  He has some rash on the upper extremities.  He was treated recently for boils and pilonidal cysts with doxycycline's and feeling much better.  He had hypersensitivity reaction to carboplatin during cycle #2 and this was discontinued.  The patient has no current chest pain, shortness of breath,  cough or hemoptysis.  He denied having any fever or chills.  He has no nausea, vomiting, diarrhea or constipation.  He has no headache or visual changes.  He is here today for evaluation before starting cycle #3 of his treatment.    MEDICAL HISTORY: Past Medical History:  Diagnosis Date   Hyperlipidemia    lung ca dx'd 12/2018   Pneumonia    Pulmonary embolus (HCC)    Tobacco abuse     ALLERGIES:  is allergic to carboplatin, clonazepam, and norco [hydrocodone-acetaminophen].  MEDICATIONS:  Current Outpatient Medications  Medication Sig Dispense Refill   albuterol (VENTOLIN HFA) 108 (90 Base) MCG/ACT inhaler Inhale 2 puffs into the lungs every 4 (four) hours as needed for wheezing or shortness of breath. (Patient not taking: Reported on 08/20/2020) 18 g 6   atorvastatin (LIPITOR) 20 MG tablet Take 1 tablet by mouth daily.     dextromethorphan-guaiFENesin (MUCINEX DM) 30-600 MG 12hr tablet Take 1 tablet by mouth 2 (two) times daily as needed for cough. (Patient not taking: Reported on 08/20/2020) 40 tablet 0   doxycycline (VIBRAMYCIN) 100 MG capsule Take 1 capsule (100 mg total) by mouth 2 (two) times daily. One po bid x 7 days 14 capsule 0   famotidine (PEPCID) 20 MG tablet TAKE 1 TABLET BY MOUTH TWICE A DAY 180 tablet 1   fexofenadine (ALLEGRA) 180 MG tablet Take 180 mg by mouth daily.     folic acid (FOLVITE) 1 MG tablet Take 1 tablet (1 mg total) by mouth daily. 30 tablet 4   HYDROcodone-acetaminophen (NORCO)  5-325 MG tablet Take 1 tablet by mouth every 6 (six) hours as needed for moderate pain. (Patient not taking: Reported on 01/22/2021) 5 tablet 0   naphazoline-pheniramine (VISINE) 0.025-0.3 % ophthalmic solution 1 drop into both eyes as needed for red eye     prochlorperazine (COMPAZINE) 10 MG tablet Take 1 tablet (10 mg total) by mouth every 6 (six) hours as needed for nausea or vomiting. 30 tablet 0   rivaroxaban (XARELTO) 20 MG TABS tablet TAKE 1 TABLET BY MOUTH DAILY WITH SUPPER.  30 tablet 0   Tiotropium Bromide-Olodaterol (STIOLTO RESPIMAT) 2.5-2.5 MCG/ACT AERS Inhale 2 puffs into the lungs daily. 3 each 3   No current facility-administered medications for this visit.    SURGICAL HISTORY:  Past Surgical History:  Procedure Laterality Date   COLONOSCOPY     HAND SURGERY     VIDEO BRONCHOSCOPY WITH ENDOBRONCHIAL ULTRASOUND N/A 12/22/2018   Procedure: VIDEO BRONCHOSCOPY WITH ENDOBRONCHIAL ULTRASOUND;  Surgeon: Garner Nash, DO;  Location: [facility];  Service: Thoracic;  Laterality: N/A;   VIDEO BRONCHOSCOPY WITH ENDOBRONCHIAL ULTRASOUND N/A 01/09/2019   Procedure: VIDEO BRONCHOSCOPY WITH ENDOBRONCHIAL ULTRASOUND WITH FLUORO;  Surgeon: Garner Nash, DO;  Location: [facility];  Service: Thoracic;  Laterality: N/A;   VIDEO BRONCHOSCOPY WITH RADIAL ENDOBRONCHIAL ULTRASOUND N/A 12/22/2018   Procedure: RADIAL ENDOBRONCHIAL ULTRASOUND;  Surgeon: Garner Nash, DO;  Location: [facility];  Service: Thoracic;  Laterality: N/A;    REVIEW OF SYSTEMS:  A comprehensive review of systems was negative except for: Constitutional: positive for fatigue   PHYSICAL EXAMINATION: General appearance: alert, cooperative, fatigued, and no distress Head: Normocephalic, without obvious abnormality, atraumatic Neck: no adenopathy, no JVD, supple, symmetrical, trachea midline, and thyroid not enlarged, symmetric, no tenderness/mass/nodules Lymph nodes: Cervical, supraclavicular, and axillary nodes normal. Resp: clear to auscultation bilaterally Back: symmetric, no curvature. ROM normal. No CVA tenderness. Cardio: regular rate and rhythm, S1, S2 normal, no murmur, click, rub or gallop GI: soft, non-tender; bowel sounds normal; no masses,  no organomegaly Extremities: extremities normal, atraumatic, no cyanosis or edema  ECOG PERFORMANCE STATUS: 1 - Symptomatic but completely ambulatory  Blood pressure 105/79, pulse 81, temperature (!) 96.8 F (36 C), temperature source Tympanic, resp. rate 17,  weight 186 lb 11.2 oz (84.7 kg), SpO2 97 %.  LABORATORY DATA: Lab Results  Component Value Date   WBC 5.4 02/11/2021   HGB 12.7 (L) 02/11/2021   HCT 37.0 (L) 02/11/2021   MCV 93.2 02/11/2021   PLT 424 (H) 02/11/2021      Chemistry      Component Value Date/Time   NA 139 02/11/2021 0834   K 4.2 02/11/2021 0834   CL 107 02/11/2021 0834   CO2 24 02/11/2021 0834   BUN 18 02/11/2021 0834   CREATININE 0.93 02/11/2021 0834      Component Value Date/Time   CALCIUM 9.3 02/11/2021 0834   ALKPHOS 84 02/11/2021 0834   AST 22 02/11/2021 0834   ALT 26 02/11/2021 0834   BILITOT 0.4 02/11/2021 0834       RADIOGRAPHIC STUDIES: DG Chest 2 View  Result Date: 01/15/2021 CLINICAL DATA:  66 year old male with history of right-sided chest pain. EXAM: CHEST - 2 VIEW COMPARISON:  Chest x-ray 03/14/2020. FINDINGS: Chronic areas of architectural distortion and volume loss are again noted in the thorax bilaterally, corresponding to chronic areas of postradiation fibrosis on recent CT examinations. Compared to the prior chest radiograph from 03/14/2020, there is no acute consolidative airspace disease. No pleural  effusions. Chronic elevation of the right hemidiaphragm is again noted. No pneumothorax. No evidence of pulmonary edema. Heart size is normal. IMPRESSION: 1. Chronic postradiation changes in the lungs, similar to prior studies, without definite radiographic evidence of acute cardiopulmonary disease. Electronically Signed   By: Vinnie Langton M.D.   On: 01/15/2021 07:23   CT Angio Chest PE W and/or Wo Contrast  Result Date: 01/15/2021 CLINICAL DATA:  Lung carcinoma, history of PE, high prob PE suspected EXAM: CT ANGIOGRAPHY CHEST WITH CONTRAST TECHNIQUE: Multidetector CT imaging of the chest was performed using the standard protocol during bolus administration of intravenous contrast. Multiplanar CT image reconstructions and MIPs were obtained to evaluate the vascular anatomy. CONTRAST:  63mL  OMNIPAQUE IOHEXOL 350 MG/ML SOLN COMPARISON:  10/25/2020 and previous FINDINGS: Cardiovascular: Heart size normal. No pericardial effusion. Mild RV dilatation. Good contrast opacification of pulmonary artery. 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 No new filling defects to suggest acute PE. 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 Coronary calcifications. Adequate contrast opacification of the thoracic aorta with no evidence of dissection, aneurysm, or stenosis. There is classic 3-vessel brachiocephalic arch anatomy without proximal stenosis. Mediastinum/Nodes: Borderline pre-vascular and AP window adenopathy stable. Progressive parenchymal consolidation in the about the right hilum obscures individual lymph nodes. Lungs/Pleura: No pleural effusion. No pneumothorax. Pulmonary emphysema. Perihilar parenchymal consolidation extending medially in the right lower lobe as before, presumably related to radiation treatments. contiguous masslike consolidation in the right suprahilar region and medial right upper lobe,Progressive since 11/27/2020. 2.4 cm nodular recurrence in the right lower lobe (6:68), previously 2.2 cm. Upper Abdomen: 2.8 cm left adrenal adenoma as before. No acute findings. Musculoskeletal: Anterior vertebral endplate spurring at multiple levels in the lower thoracic spine. No fracture or worrisome bone lesion. Review of the MIP images confirms the above findings. IMPRESSION: 1. Negative for acute PE or thoracic aortic dissection. 2. 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 3. Presumed radiation changes about the right hilum with progressive right lung lower lobe nodule and medial right upper lobe disease. 4. Coronary and Aortic Atherosclerosis (ICD10-170.0). 5.  Emphysema (ICD10-J43.9). Electronically Signed   By: Lucrezia Europe M.D.   On: 01/15/2021 09:34     ASSESSMENT AND PLAN: This is a very pleasant 66 years old  white male with a stage IIIc non-small cell lung cancer, adenocarcinoma diagnosed in December 2020 The patient completed a course of concurrent chemoradiation with weekly carboplatin and paclitaxel status post 6 cycles and he tolerated his treatment well and has partial response. He underwent consolidation treatment with immunotherapy with Imfinzi 1500 mg IV every 4 weeks status post 13 cycles. He was on observation and feeling fine with no concerning complaints except for mild cough as well as the left shoulder pain. His scan showed increase in the size of the soft tissue nodule in the right middle lobe adjacent to the postradiation changes highly suspicious for recurrent disease.  There was also soft tissue of the right hilum/as ago esophageal recess unchanged at but increased compared to remote prior studies. The patient had a PET scan that showed hypermetabolic nodule in the right lower lobe adjacent to the post radiation treatment site increased in size and consistent with lung cancer recurrence.  There was second hypermetabolic nodule in the right lower lobe above the diaphragm concerning for recurrence and hypermetabolic contralateral lymph node in the high left paratracheal mediastinum also concerning for metastatic adenopathy recurrence. He started systemic  chemotherapy with carboplatin for AUC of 5, Alimta 500 Mg/M2 and Keytruda 200 Mg IV every 3 weeks on January 01, 2021.  Status post 2 cycles.  Carboplatin was discontinued with cycle #2 secondary to hypersensitivity reaction. I recommended for the patient to proceed with cycle #3 today with Alimta and Keytruda every 3 weeks. I will see him back for follow-up visit in 3 weeks for evaluation before the next cycle of his treatment. For the history of pulmonary embolism, he will continue on Xarelto. The patient was advised to call immediately if he has any other concerning symptoms in the interval.  The patient voices understanding of current  disease status and treatment options and is in agreement with the current care plan. All questions were answered. The patient knows to call the clinic with any problems, questions or concerns. We can certainly see the patient much sooner if necessary.  Disclaimer: This note was dictated with voice recognition software. Similar sounding words can inadvertently be transcribed and may not be corrected upon review.

## 2021-02-11 NOTE — Patient Instructions (Signed)
[facility]  Discharge Instructions: Thank you for choosing [facility] to provide your oncology and hematology care.   If you have a lab appointment with the Cancer Center, please go directly to the Cancer Center and check in at the registration area.   Wear comfortable clothing and clothing appropriate for easy access to any Portacath or PICC line.   We strive to give you quality time with your provider. You may need to reschedule your appointment if you arrive late (15 or more minutes).  Arriving late affects you and other patients whose appointments are after yours.  Also, if you miss three or more appointments without notifying the office, you may be dismissed from the clinic at the provider's discretion.      For prescription refill requests, have your pharmacy contact our office and allow 72 hours for refills to be completed.    Today you received the following chemotherapy and/or immunotherapy agents Pembrolizumab (Keytruda) and Pemetrexed (Alimta)      To help prevent nausea and vomiting after your treatment, we encourage you to take your nausea medication as directed.  BELOW ARE SYMPTOMS THAT SHOULD BE REPORTED IMMEDIATELY: *FEVER GREATER THAN 100.4 F (38 C) OR HIGHER *CHILLS OR SWEATING *NAUSEA AND VOMITING THAT IS NOT CONTROLLED WITH YOUR NAUSEA MEDICATION *UNUSUAL SHORTNESS OF BREATH *UNUSUAL BRUISING OR BLEEDING *URINARY PROBLEMS (pain or burning when urinating, or frequent urination) *BOWEL PROBLEMS (unusual diarrhea, constipation, pain near the anus) TENDERNESS IN MOUTH AND THROAT WITH OR WITHOUT PRESENCE OF ULCERS (sore throat, sores in mouth, or a toothache) UNUSUAL RASH, SWELLING OR PAIN  UNUSUAL VAGINAL DISCHARGE OR ITCHING   Items with * indicate a potential emergency and should be followed up as soon as possible or go to the Emergency Department if any problems should occur.  Please show the CHEMOTHERAPY ALERT CARD or  IMMUNOTHERAPY ALERT CARD at check-in to the Emergency Department and triage nurse.  Should you have questions after your visit or need to cancel or reschedule your appointment, please contact [facility]  Dept: [phone]  and follow the prompts.  Office hours are 8:00 a.m. to 4:30 p.m. Monday - Friday. Please note that voicemails left after 4:00 p.m. may not be returned until the following business day.  We are closed weekends and major holidays. You have access to a nurse at all times for urgent questions. Please call the main number to the clinic Dept: [phone] and follow the prompts.   For any non-urgent questions, you may also contact your provider using MyChart. We now offer e-Visits for anyone 18 and older to request care online for non-urgent symptoms. For details visit [email or web address].   Also download the MyChart app! Go to the app store, search "MyChart", open the app, select [facility], and log in with your MyChart username and password.  Due to Covid, a mask is required upon entering the hospital/clinic. If you do not have a mask, one will be given to you upon arrival. For doctor visits, patients may have 1 support person aged 18 or older with them. For treatment visits, patients cannot have anyone with them due to current Covid guidelines and our immunocompromised population.   

## 2021-02-12 ENCOUNTER — Ambulatory Visit: Payer: No Typology Code available for payment source | Admitting: Internal Medicine

## 2021-02-12 ENCOUNTER — Ambulatory Visit: Payer: No Typology Code available for payment source

## 2021-02-12 ENCOUNTER — Other Ambulatory Visit: Payer: No Typology Code available for payment source

## 2021-02-17 ENCOUNTER — Other Ambulatory Visit: Payer: No Typology Code available for payment source

## 2021-02-19 ENCOUNTER — Other Ambulatory Visit: Payer: No Typology Code available for payment source

## 2021-02-24 ENCOUNTER — Other Ambulatory Visit: Payer: No Typology Code available for payment source

## 2021-02-25 ENCOUNTER — Encounter: Payer: Self-pay | Admitting: Physician Assistant

## 2021-02-25 ENCOUNTER — Encounter: Payer: Self-pay | Admitting: Internal Medicine

## 2021-02-26 ENCOUNTER — Other Ambulatory Visit: Payer: No Typology Code available for payment source

## 2021-03-03 ENCOUNTER — Inpatient Hospital Stay: Payer: No Typology Code available for payment source

## 2021-03-03 ENCOUNTER — Other Ambulatory Visit: Payer: Self-pay

## 2021-03-03 ENCOUNTER — Inpatient Hospital Stay (HOSPITAL_BASED_OUTPATIENT_CLINIC_OR_DEPARTMENT_OTHER): Payer: No Typology Code available for payment source | Admitting: Internal Medicine

## 2021-03-03 ENCOUNTER — Encounter: Payer: Self-pay | Admitting: Internal Medicine

## 2021-03-03 VITALS — BP 120/87 | HR 88 | Temp 96.1°F | Resp 20 | Ht 66.0 in | Wt 188.6 lb

## 2021-03-03 VITALS — BP 113/68 | HR 69 | Temp 98.2°F | Resp 16

## 2021-03-03 DIAGNOSIS — C3490 Malignant neoplasm of unspecified part of unspecified bronchus or lung: Secondary | ICD-10-CM

## 2021-03-03 DIAGNOSIS — Z5111 Encounter for antineoplastic chemotherapy: Secondary | ICD-10-CM

## 2021-03-03 DIAGNOSIS — Z5112 Encounter for antineoplastic immunotherapy: Secondary | ICD-10-CM

## 2021-03-03 DIAGNOSIS — C3491 Malignant neoplasm of unspecified part of right bronchus or lung: Secondary | ICD-10-CM | POA: Diagnosis not present

## 2021-03-03 LAB — CMP (CANCER CENTER ONLY)
ALT: 30 U/L (ref 0–44)
AST: 24 U/L (ref 15–41)
Albumin: 3.9 g/dL (ref 3.5–5.0)
Alkaline Phosphatase: 72 U/L (ref 38–126)
Anion gap: 7 (ref 5–15)
BUN: 14 mg/dL (ref 8–23)
CO2: 26 mmol/L (ref 22–32)
Calcium: 9.1 mg/dL (ref 8.9–10.3)
Chloride: 105 mmol/L (ref 98–111)
Creatinine: 1.01 mg/dL (ref 0.61–1.24)
GFR, Estimated: >60 mL/min (ref >60)
Glucose, Bld: 110 mg/dL — ABNORMAL HIGH (ref 70–99)
Potassium: 4.3 mmol/L (ref 3.5–5.1)
Sodium: 138 mmol/L (ref 135–145)
Total Bilirubin: 0.5 mg/dL (ref 0.3–1.2)
Total Protein: 7.2 g/dL (ref 6.5–8.1)

## 2021-03-03 LAB — TSH: TSH: 2.033 u[IU]/mL (ref 0.320–4.118)

## 2021-03-03 LAB — CBC WITH DIFFERENTIAL (CANCER CENTER ONLY)
Abs Immature Granulocytes: 0.03 10*3/uL (ref 0.00–0.07)
Basophils Absolute: 0 10*3/uL (ref 0.0–0.1)
Basophils Relative: 1 %
Eosinophils Absolute: 0.2 10*3/uL (ref 0.0–0.5)
Eosinophils Relative: 3 %
HCT: 39.8 % (ref 39.0–52.0)
Hemoglobin: 13.7 g/dL (ref 13.0–17.0)
Immature Granulocytes: 1 %
Lymphocytes Relative: 17 %
Lymphs Abs: 0.9 10*3/uL (ref 0.7–4.0)
MCH: 32.8 pg (ref 26.0–34.0)
MCHC: 34.4 g/dL (ref 30.0–36.0)
MCV: 95.2 fL (ref 80.0–100.0)
Monocytes Absolute: 0.8 10*3/uL (ref 0.1–1.0)
Monocytes Relative: 15 %
Neutro Abs: 3.4 10*3/uL (ref 1.7–7.7)
Neutrophils Relative %: 63 %
Platelet Count: 215 10*3/uL (ref 150–400)
RBC: 4.18 MIL/uL — ABNORMAL LOW (ref 4.22–5.81)
RDW: 15.9 % — ABNORMAL HIGH (ref 11.5–15.5)
WBC Count: 5.3 10*3/uL (ref 4.0–10.5)
nRBC: 0 % (ref 0.0–0.2)

## 2021-03-03 MED ORDER — PROCHLORPERAZINE MALEATE 10 MG PO TABS
10.0000 mg | ORAL_TABLET | Freq: Once | ORAL | Status: AC
  Administered 2021-03-03: 10 mg via ORAL
  Filled 2021-03-03: qty 1

## 2021-03-03 MED ORDER — SODIUM CHLORIDE 0.9 % IV SOLN
200.0000 mg | Freq: Once | INTRAVENOUS | Status: AC
  Administered 2021-03-03: 200 mg via INTRAVENOUS
  Filled 2021-03-03: qty 200

## 2021-03-03 MED ORDER — SODIUM CHLORIDE 0.9 % IV SOLN
500.0000 mg/m2 | Freq: Once | INTRAVENOUS | Status: AC
  Administered 2021-03-03: 1000 mg via INTRAVENOUS
  Filled 2021-03-03: qty 40

## 2021-03-03 MED ORDER — SODIUM CHLORIDE 0.9 % IV SOLN: Freq: Once | INTRAVENOUS | Status: AC

## 2021-03-03 NOTE — Progress Notes (Signed)
Treatment given per orders. Patient tolerated it well without problems. Vitals stable and discharged home from clinic ambulatory. Follow up as scheduled.  

## 2021-03-03 NOTE — Patient Instructions (Signed)
Steps to Quit Smoking Smoking tobacco is the leading cause of preventable death. It can affect almost every organ in the body. Smoking puts you and people around you at risk for many serious, long-lasting (chronic) diseases. Quitting smoking can be hard, but it is one of the best things that you can do for your health. It is never too late to quit. How do I get ready to quit? When you decide to quit smoking, make a plan to help you succeed. Before you quit: Pick a date to quit. Set a date within the next 2 weeks to give you time to prepare. Write down the reasons why you are quitting. Keep this list in places where you will see it often. Tell your family, friends, and co-workers that you are quitting. Their support is important. Talk with your doctor about the choices that may help you quit. Find out if your health insurance will pay for these treatments. Know the people, places, things, and activities that make you want to smoke (triggers). Avoid them. What first steps can I take to quit smoking? Throw away all cigarettes at home, at work, and in your car. Throw away the things that you use when you smoke, such as ashtrays and lighters. Clean your car. Make sure to empty the ashtray. Clean your home, including curtains and carpets. What can I do to help me quit smoking? Talk with your doctor about taking medicines and seeing a counselor at the same time. You are more likely to succeed when you do both. If you are pregnant or breastfeeding, talk with your doctor about counseling or other ways to quit smoking. Do not take medicine to help you quit smoking unless your doctor tells you to do so. To quit smoking: Quit right away Quit smoking totally, instead of slowly cutting back on how much you smoke over a period of time. Go to counseling. You are more likely to quit if you go to counseling sessions regularly. Take medicine You may take medicines to help you quit. Some medicines need a  prescription, and some you can buy over-the-counter. Some medicines may contain a drug called nicotine to replace the nicotine in cigarettes. Medicines may: Help you to stop having the desire to smoke (cravings). Help to stop the problems that come when you stop smoking (withdrawal symptoms). Your doctor may ask you to use: Nicotine patches, gum, or lozenges. Nicotine inhalers or sprays. Non-nicotine medicine that is taken by mouth. Find resources Find resources and other ways to help you quit smoking and remain smoke-free after you quit. These resources are most helpful when you use them often. They include: Online chats with a counselor. Phone quitlines. Printed self-help materials. Support groups or group counseling. Text messaging programs. Mobile phone apps. Use apps on your mobile phone or tablet that can help you stick to your quit plan. There are many free apps for mobile phones and tablets as well as websites. Examples include Quit Guide from the CDC and smokefree.gov  What things can I do to make it easier to quit?  Talk to your family and friends. Ask them to support and encourage you. Call a phone quitline (1-800-QUIT-NOW), reach out to support groups, or work with a counselor. Ask people who smoke to not smoke around you. Avoid places that make you want to smoke, such as: Bars. Parties. Smoke-break areas at work. Spend time with people who do not smoke. Lower the stress in your life. Stress can make you want to   smoke. Try these things to help your stress: Getting regular exercise. Doing deep-breathing exercises. Doing yoga. Meditating. Doing a body scan. To do this, close your eyes, focus on one area of your body at a time from head to toe. Notice which parts of your body are tense. Try to relax the muscles in those areas. How will I feel when I quit smoking? Day 1 to 3 weeks Within the first 24 hours, you may start to have some problems that come from quitting tobacco.  These problems are very bad 2-3 days after you quit, but they do not often last for more than 2-3 weeks. You may get these symptoms: Mood swings. Feeling restless, nervous, angry, or annoyed. Trouble concentrating. Dizziness. Strong desire for high-sugar foods and nicotine. Weight gain. Trouble pooping (constipation). Feeling like you may vomit (nausea). Coughing or a sore throat. Changes in how the medicines that you take for other issues work in your body. Depression. Trouble sleeping (insomnia). Week 3 and afterward After the first 2-3 weeks of quitting, you may start to notice more positive results, such as: Better sense of smell and taste. Less coughing and sore throat. Slower heart rate. Lower blood pressure. Clearer skin. Better breathing. Fewer sick days. Quitting smoking can be hard. Do not give up if you fail the first time. Some people need to try a few times before they succeed. Do your best to stick to your quit plan, and talk with your doctor if you have any questions or concerns. Summary Smoking tobacco is the leading cause of preventable death. Quitting smoking can be hard, but it is one of the best things that you can do for your health. When you decide to quit smoking, make a plan to help you succeed. Quit smoking right away, not slowly over a period of time. When you start quitting, seek help from your doctor, family, or friends. This information is not intended to replace advice given to you by your health care provider. Make sure you discuss any questions you have with your health care provider. Document Revised: 09/27/2020 Document Reviewed: 04/09/2018 [employer] Patient Education  2022 [employer]  

## 2021-03-03 NOTE — Patient Instructions (Signed)
[facility]  Discharge Instructions: Thank you for choosing [facility] to provide your oncology and hematology care.   If you have a lab appointment with the [facility], please go directly to the [facility] and check in at the registration area.   Wear comfortable clothing and clothing appropriate for easy access to any Portacath or PICC line.   We strive to give you quality time with your provider. You may need to reschedule your appointment if you arrive late (15 or more minutes).  Arriving late affects you and other patients whose appointments are after yours.  Also, if you miss three or more appointments without notifying the office, you may be dismissed from the clinic at the providers discretion.      For prescription refill requests, have your pharmacy contact our office and allow 72 hours for refills to be completed.    Today you received the following chemotherapy and/or immunotherapy agents Keytruda, alimta   To help prevent nausea and vomiting after your treatment, we encourage you to take your nausea medication as directed.  BELOW ARE SYMPTOMS THAT SHOULD BE REPORTED IMMEDIATELY: *FEVER GREATER THAN 100.4 F (38 C) OR HIGHER *CHILLS OR SWEATING *NAUSEA AND VOMITING THAT IS NOT CONTROLLED WITH YOUR NAUSEA MEDICATION *UNUSUAL SHORTNESS OF BREATH *UNUSUAL BRUISING OR BLEEDING *URINARY PROBLEMS (pain or burning when urinating, or frequent urination) *BOWEL PROBLEMS (unusual diarrhea, constipation, pain near the anus) TENDERNESS IN MOUTH AND THROAT WITH OR WITHOUT PRESENCE OF ULCERS (sore throat, sores in mouth, or a toothache) UNUSUAL RASH, SWELLING OR PAIN  UNUSUAL VAGINAL DISCHARGE OR ITCHING   Items with * indicate a potential emergency and should be followed up as soon as possible or go to the Emergency Department if any problems should occur.  Please show the CHEMOTHERAPY ALERT CARD or IMMUNOTHERAPY ALERT CARD at check-in  to the Emergency Department and triage nurse.  Should you have questions after your visit or need to cancel or reschedule your appointment, please contact [facility]  Dept: [phone]  and follow the prompts.  Office hours are 8:00 a.m. to 4:30 p.m. Monday - Friday. Please note that voicemails left after 4:00 p.m. may not be returned until the following business day.  We are closed weekends and major holidays. You have access to a nurse at all times for urgent questions. Please call the main number to the clinic Dept: [phone] and follow the prompts.   For any non-urgent questions, you may also contact your provider using MyChart. We now offer e-Visits for anyone 47 and older to request care online for non-urgent symptoms. For details visit [email or web address].   Also download the MyChart app! Go to the app store, search "MyChart", open the app, select [facility], and log in with your MyChart username and password.  Due to Covid, a mask is required upon entering the hospital/clinic. If you do not have a mask, one will be given to you upon arrival. For doctor visits, patients may have 1 support person aged 14 or older with them. For treatment visits, patients cannot have anyone with them due to current Covid guidelines and our immunocompromised population.

## 2021-03-03 NOTE — Progress Notes (Signed)
[place] [phone]   [phone]  OFFICE PROGRESS NOTE  [name], MD [address] [address]  DIAGNOSIS:  1) recurrent non-small cell lung cancer initially diagnosed as stage IIIc (T4, N3, M0) non-small cell lung cancer, adenocarcinoma presented with large right hilar mass in addition to bilateral hilar and mediastinal lymphadenopathy diagnosed in December 2020.  The patient has evidence for disease recurrence in October 2022. 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  Started on Xarelto on March 17, 2020 and currently 20 mg p.o. daily.   PRIOR THERAPY:  1) Weekly concurrent chemoradiation with carboplatin for an AUC of 2 and paclitaxel 45 mg/m2. First dose starting 01/30/2019.   Status post 6 cycles. 2) Consolidation immunotherapy with Imfinzi 1500 mg IV every 4 weeks. First dose expected on 04/19/2019.  Status post 13 cycles. 3) disease recurrence in October 2022.   CURRENT THERAPY: Systemic chemotherapy with carboplatin for AUC of 5, Alimta 500 Mg/M2 and Keytruda 200 Mg IV every 3 weeks.  First dose January 01, 2021.  Status post 3 cycles.  He had hypersensitivity reaction to carboplatin and this was discontinued after cycle #2.  INTERVAL HISTORY: Philip [name] 66 y.o. male returns to the clinic today for follow-up visit.  The patient is feeling fine today with no concerning complaints he denied having any chest pain, shortness of breath, cough or hemoptysis.  He denied having any fever or chills.  He has no nausea, vomiting, diarrhea or constipation.  He has no headache or visual changes.  He has occasional dizzy spells.  He denied having any recent weight loss or night sweats.  The patient is here today for evaluation before starting cycle #4 of his treatment.  MEDICAL HISTORY: Past Medical History:   Diagnosis Date   Hyperlipidemia    lung ca dx'd 12/2018   Pneumonia    Pulmonary embolus (HCC)    Tobacco abuse     ALLERGIES:  is allergic to carboplatin, clonazepam, and norco [hydrocodone-acetaminophen].  MEDICATIONS:  Current Outpatient Medications  Medication Sig Dispense Refill   albuterol (VENTOLIN HFA) 108 (90 Base) MCG/ACT inhaler Inhale 2 puffs into the lungs every 4 (four) hours as needed for wheezing or shortness of breath. (Patient not taking: Reported on 08/20/2020) 18 g 6   atorvastatin (LIPITOR) 20 MG tablet Take 1 tablet by mouth daily.     dextromethorphan-guaiFENesin (MUCINEX DM) 30-600 MG 12hr tablet Take 1 tablet by mouth 2 (two) times daily as needed for cough. (Patient not taking: Reported on 08/20/2020) 40 tablet 0   famotidine (PEPCID) 20 MG tablet TAKE 1 TABLET BY MOUTH TWICE A DAY 180 tablet 1   fexofenadine (ALLEGRA) 180 MG tablet Take 180 mg by mouth daily.     folic acid (FOLVITE) 1 MG tablet Take 1 tablet (1 mg total) by mouth daily. 30 tablet 4   HYDROcodone-acetaminophen (NORCO) 5-325 MG tablet Take 1 tablet by mouth every 6 (six) hours as needed for moderate pain. (Patient not taking: Reported on 01/22/2021) 5 tablet 0   naphazoline-pheniramine (VISINE) 0.025-0.3 % ophthalmic solution 1 drop into both eyes as needed for red eye     prochlorperazine (COMPAZINE) 10 MG tablet Take 1 tablet (10 mg total) by mouth every 6 (six) hours as needed for nausea or vomiting. 30 tablet 0   rivaroxaban (XARELTO) 20 MG TABS  tablet TAKE 1 TABLET BY MOUTH DAILY WITH SUPPER. 30 tablet 0   Tiotropium Bromide-Olodaterol (STIOLTO RESPIMAT) 2.5-2.5 MCG/ACT AERS Inhale 2 puffs into the lungs daily. 3 each 3   No current facility-administered medications for this visit.    SURGICAL HISTORY:  Past Surgical History:  Procedure Laterality Date   COLONOSCOPY     HAND SURGERY     VIDEO BRONCHOSCOPY WITH ENDOBRONCHIAL ULTRASOUND N/A 12/22/2018   Procedure: VIDEO BRONCHOSCOPY WITH  ENDOBRONCHIAL ULTRASOUND;  Surgeon: Garner Nash, DO;  Location: [facility];  Service: Thoracic;  Laterality: N/A;   VIDEO BRONCHOSCOPY WITH ENDOBRONCHIAL ULTRASOUND N/A 01/09/2019   Procedure: VIDEO BRONCHOSCOPY WITH ENDOBRONCHIAL ULTRASOUND WITH FLUORO;  Surgeon: Garner Nash, DO;  Location: [place];  Service: Thoracic;  Laterality: N/A;   VIDEO BRONCHOSCOPY WITH RADIAL ENDOBRONCHIAL ULTRASOUND N/A 12/22/2018   Procedure: RADIAL ENDOBRONCHIAL ULTRASOUND;  Surgeon: Garner Nash, DO;  Location: [facility];  Service: Thoracic;  Laterality: N/A;    REVIEW OF SYSTEMS:  A comprehensive review of systems was negative except for: Constitutional: positive for fatigue   PHYSICAL EXAMINATION: General appearance: alert, cooperative, fatigued, and no distress Head: Normocephalic, without obvious abnormality, atraumatic Neck: no adenopathy, no JVD, supple, symmetrical, trachea midline, and thyroid not enlarged, symmetric, no tenderness/mass/nodules Lymph nodes: Cervical, supraclavicular, and axillary nodes normal. Resp: clear to auscultation bilaterally Back: symmetric, no curvature. ROM normal. No CVA tenderness. Cardio: regular rate and rhythm, S1, S2 normal, no murmur, click, rub or gallop GI: soft, non-tender; bowel sounds normal; no masses,  no organomegaly Extremities: extremities normal, atraumatic, no cyanosis or edema  ECOG PERFORMANCE STATUS: 1 - Symptomatic but completely ambulatory  Blood pressure 120/87, pulse 88, temperature (!) 96.1 F (35.6 C), temperature source Tympanic, resp. rate 20, height 5\' 6"  (1.676 m), weight 188 lb 9.6 oz (85.5 kg), SpO2 97 %.  LABORATORY DATA: Lab Results  Component Value Date   WBC 5.3 03/03/2021   HGB 13.7 03/03/2021   HCT 39.8 03/03/2021   MCV 95.2 03/03/2021   PLT 215 03/03/2021      Chemistry      Component Value Date/Time   NA 139 02/11/2021 0834   K 4.2 02/11/2021 0834   CL 107 02/11/2021 0834   CO2 24 02/11/2021 0834   BUN 18 02/11/2021  0834   CREATININE 0.93 02/11/2021 0834      Component Value Date/Time   CALCIUM 9.3 02/11/2021 0834   ALKPHOS 84 02/11/2021 0834   AST 22 02/11/2021 0834   ALT 26 02/11/2021 0834   BILITOT 0.4 02/11/2021 0834       RADIOGRAPHIC STUDIES: No results found.   ASSESSMENT AND PLAN: This is a very pleasant 66 years old white male with a stage IIIc non-small cell lung cancer, adenocarcinoma diagnosed in December 2020 The patient completed a course of concurrent chemoradiation with weekly carboplatin and paclitaxel status post 6 cycles and he tolerated his treatment well and has partial response. He underwent consolidation treatment with immunotherapy with Imfinzi 1500 mg IV every 4 weeks status post 13 cycles. He was on observation and feeling fine with no concerning complaints except for mild cough as well as the left shoulder pain. His scan showed increase in the size of the soft tissue nodule in the right middle lobe adjacent to the postradiation changes highly suspicious for recurrent disease.  There was also soft tissue of the right hilum/as ago esophageal recess unchanged at but increased compared to remote prior studies. The patient had a PET  scan that showed hypermetabolic nodule in the right lower lobe adjacent to the post radiation treatment site increased in size and consistent with lung cancer recurrence.  There was second hypermetabolic nodule in the right lower lobe above the diaphragm concerning for recurrence and hypermetabolic contralateral lymph node in the high left paratracheal mediastinum also concerning for metastatic adenopathy recurrence. He started systemic chemotherapy with carboplatin for AUC of 5, Alimta 500 Mg/M2 and Keytruda 200 Mg IV every 3 weeks on January 01, 2021.   Carboplatin was discontinued with cycle #2 secondary to hypersensitivity reaction.  The patient is status post 3 cycles. He has been tolerating the treatment well with no concerning complaints except  for mild fatigue. I recommended for him to proceed with cycle #4 today as planned. I will see him back for follow-up visit in 3 weeks for evaluation with repeat CT scan of the chest, abdomen and pelvis for restaging of his disease. For the history of pulmonary embolism, he will continue on Xarelto. The patient was advised to call immediately if he has any other concerning symptoms in the interval.  The patient voices understanding of current disease status and treatment options and is in agreement with the current care plan. All questions were answered. The patient knows to call the clinic with any problems, questions or concerns. We can certainly see the patient much sooner if necessary.  Disclaimer: This note was dictated with voice recognition software. Similar sounding words can inadvertently be transcribed and may not be corrected upon review.

## 2021-03-05 ENCOUNTER — Ambulatory Visit: Payer: No Typology Code available for payment source

## 2021-03-05 ENCOUNTER — Ambulatory Visit: Payer: No Typology Code available for payment source | Admitting: Internal Medicine

## 2021-03-05 ENCOUNTER — Other Ambulatory Visit: Payer: No Typology Code available for payment source

## 2021-03-07 ENCOUNTER — Other Ambulatory Visit: Payer: Self-pay | Admitting: Primary Care

## 2021-03-10 ENCOUNTER — Other Ambulatory Visit: Payer: No Typology Code available for payment source

## 2021-03-12 ENCOUNTER — Other Ambulatory Visit: Payer: Self-pay

## 2021-03-12 ENCOUNTER — Encounter: Payer: Self-pay | Admitting: Pulmonary Disease

## 2021-03-12 ENCOUNTER — Ambulatory Visit (INDEPENDENT_AMBULATORY_CARE_PROVIDER_SITE_OTHER): Payer: No Typology Code available for payment source | Admitting: Pulmonary Disease

## 2021-03-12 VITALS — BP 128/76 | HR 96 | Temp 98.0°F | Ht 66.0 in | Wt 180.0 lb

## 2021-03-12 DIAGNOSIS — J440 Chronic obstructive pulmonary disease with acute lower respiratory infection: Secondary | ICD-10-CM | POA: Diagnosis not present

## 2021-03-12 DIAGNOSIS — J209 Acute bronchitis, unspecified: Secondary | ICD-10-CM | POA: Diagnosis not present

## 2021-03-12 DIAGNOSIS — I2690 Septic pulmonary embolism without acute cor pulmonale: Secondary | ICD-10-CM | POA: Diagnosis not present

## 2021-03-12 MED ORDER — STIOLTO RESPIMAT 2.5-2.5 MCG/ACT IN AERS: 2.0000 | INHALATION_SPRAY | Freq: Every day | RESPIRATORY_TRACT | 11 refills | Status: DC

## 2021-03-12 MED ORDER — RIVAROXABAN 20 MG PO TABS: 20.0000 mg | ORAL_TABLET | Freq: Every day | ORAL | 11 refills | Status: DC

## 2021-03-12 NOTE — Progress Notes (Signed)
Synopsis: Referred in January 2020 for pulmonary MAC by [name], MD.  Previously seen by Dr. Adair Laundry.  Previously seen by Dr. Valeta Harms.  Subjective:   PATIENT ID: Philip [name] GENDER: male DOB: 1955-11-16, MRN: [id number]  Chief Complaint  Patient presents with   New Patient (Initial Visit)    New pt from Dr Carlis Abbott. Pt was seen in 2021 by Dr Carlis Abbott. Was seeing Dr Carlis Abbott for COPD, cancer, PE, SOB, and other issues.     Philip [name] is a 66 y.o. gentleman with a history of stage III lung adenocarcinoma, COPD who presents to establish care. Last seen 01/2020, Note from Dr. Carlis Abbott reviewed. He continues on Stiolto maintenance for COPD, which had historically improved his shortness of breath.  Most recent Oncology note reviewed. Recurrence of cancer fall 2022. Resumed chemotherapy with carboplatin (stopped after 2 cycles due to hypersensitivity reaction), pemetrexed, Keytruda.    Overall, symptoms stable.  Occasional morning cough.  Not too bad.  Cough largely improved he thinks with timing of chemotherapy.  Dyspnea relatively stable.  Unsure if Stiolto helps.  Has been off it for a week at a time and resumed it and has not seen much improvement.  He is okay to continue for now.  Discussed potentially escalating inhaler therapy if symptoms worsen but given relative stability and symptoms shared decision to continue current therapy.   He had a pulmonary embolus triggered by his cancer.  He was on Eliquis but insurance stopped covering. Currently on xarelto.       Past Medical History:  Diagnosis Date   Hyperlipidemia    lung ca dx'd 12/2018   Pneumonia    Pulmonary embolus (HCC)    Tobacco abuse      Family History  Problem Relation Age of Onset   Cancer Father    Stomach cancer Father      Past Surgical History:  Procedure Laterality Date   COLONOSCOPY     HAND SURGERY     VIDEO BRONCHOSCOPY WITH ENDOBRONCHIAL ULTRASOUND N/A 12/22/2018   Procedure: VIDEO BRONCHOSCOPY WITH  ENDOBRONCHIAL ULTRASOUND;  Surgeon: Garner Nash, DO;  Location: [facility];  Service: Thoracic;  Laterality: N/A;   VIDEO BRONCHOSCOPY WITH ENDOBRONCHIAL ULTRASOUND N/A 01/09/2019   Procedure: VIDEO BRONCHOSCOPY WITH ENDOBRONCHIAL ULTRASOUND WITH FLUORO;  Surgeon: Garner Nash, DO;  Location: [facility];  Service: Thoracic;  Laterality: N/A;   VIDEO BRONCHOSCOPY WITH RADIAL ENDOBRONCHIAL ULTRASOUND N/A 12/22/2018   Procedure: RADIAL ENDOBRONCHIAL ULTRASOUND;  Surgeon: Garner Nash, DO;  Location: [facility];  Service: Thoracic;  Laterality: N/A;    Social History   Socioeconomic History   Marital status: Married    Spouse name: Not on file   Number of children: Not on file   Years of education: Not on file   Highest education level: Not on file  Occupational History   Not on file  Tobacco Use   Smoking status: Light Smoker    Years: 45.00    Types: Cigarettes    Start date: 07/04/1970   Smokeless tobacco: Former    Types: Nurse, children's Use: Never used  Substance and Sexual Activity   Alcohol use: Yes    Comment: drinks 1/2 5th every weekend   Drug use: Not Currently    Types: Marijuana   Sexual activity: Not on file  Other Topics Concern   Not on file  Social History Narrative   Not on file   Social Determinants of  Health   Financial Resource Strain: Not on file  Food Insecurity: Not on file  Transportation Needs: Not on file  Physical Activity: Not on file  Stress: Not on file  Social Connections: Not on file  Intimate Partner Violence: Not on file     Allergies  Allergen Reactions   Carboplatin Anaphylaxis, Shortness Of Breath, Cough and Hypertension    On dose 8. Became short of breath. Symptom management was called. Received benadryl, solumedrol, Pepcid and fluids. Medication discontinued.    Clonazepam Other (See Comments)    nervous   Norco [Hydrocodone-Acetaminophen] Other (See Comments)    Made pt feel jittery      Immunization History   Administered Date(s) Administered   Influenza Split 11/20/2008   Influenza,inj,Quad PF,6+ Mos 10/24/2018, 11/08/2018, 01/15/2020   Influenza,inj,quad, With Preservative 03/15/2015   PFIZER(Purple Top)SARS-COV-2 Vaccination 04/27/2019, 05/11/2019, 12/07/2019, 12/26/2019   Pneumococcal Polysaccharide-23 12/12/2007, 10/10/2019   Td 05/18/2014   Tdap 11/16/2006    Outpatient Medications Prior to Visit  Medication Sig Dispense Refill   albuterol (VENTOLIN HFA) 108 (90 Base) MCG/ACT inhaler Inhale 2 puffs into the lungs every 4 (four) hours as needed for wheezing or shortness of breath. 18 g 6   atorvastatin (LIPITOR) 20 MG tablet Take 1 tablet by mouth daily.     dextromethorphan-guaiFENesin (MUCINEX DM) 30-600 MG 12hr tablet Take 1 tablet by mouth 2 (two) times daily as needed for cough. 40 tablet 0   famotidine (PEPCID) 20 MG tablet TAKE 1 TABLET BY MOUTH TWICE A DAY 180 tablet 1   fexofenadine (ALLEGRA) 180 MG tablet Take 180 mg by mouth daily.     folic acid (FOLVITE) 1 MG tablet Take 1 tablet (1 mg total) by mouth daily. 30 tablet 4   HYDROcodone-acetaminophen (NORCO) 5-325 MG tablet Take 1 tablet by mouth every 6 (six) hours as needed for moderate pain. 5 tablet 0   naphazoline-pheniramine (VISINE) 0.025-0.3 % ophthalmic solution 1 drop into both eyes as needed for red eye     prochlorperazine (COMPAZINE) 10 MG tablet Take 1 tablet (10 mg total) by mouth every 6 (six) hours as needed for nausea or vomiting. 30 tablet 0   Tiotropium Bromide-Olodaterol (STIOLTO RESPIMAT) 2.5-2.5 MCG/ACT AERS Inhale 2 puffs into the lungs daily. 3 each 3   XARELTO 20 MG TABS tablet TAKE 1 TABLET BY MOUTH DAILY WITH SUPPER 30 tablet 0   No facility-administered medications prior to visit.   Review of systems: N/AA   Objective:   Vitals:   03/12/21 0909  BP: 128/76  Pulse: 96  Temp: 98 F (36.7 C)  TempSrc: Oral  SpO2: 98%  Weight: 180 lb (81.6 kg)  Height: _0  (1.676 m)   98% on   RA BMI  Readings from Last 3 Encounters:  03/12/21 29.05 kg/m  03/03/21 30.44 kg/m  02/11/21 30.13 kg/m   Wt Readings from Last 3 Encounters:  03/12/21 180 lb (81.6 kg)  03/03/21 188 lb 9.6 oz (85.5 kg)  02/11/21 186 lb 11.2 oz (84.7 kg)    Physical Exam Vitals reviewed.  Constitutional:      General: He is not in acute distress.    Appearance: He is not ill-appearing.  HENT:     Head: Normocephalic and atraumatic.  Eyes:     General: No scleral icterus. Cardiovascular:     Rate and Rhythm: Normal rate and regular rhythm.     Heart sounds: No murmur heard. Pulmonary:     Comments: Breathing comfortably on  room air, lungs clear, on room air Abdominal:     General: There is no distension.     Palpations: Abdomen is soft.     Tenderness: There is no abdominal tenderness.  Musculoskeletal:        General: No swelling or deformity.     Cervical back: Neck supple.  Lymphadenopathy:     Cervical: No cervical adenopathy.  Skin:    General: Skin is warm and dry.     Findings: No rash.  Neurological:     General: No focal deficit present.     Mental Status: He is alert.     Coordination: Coordination normal.  Psychiatric:        Mood and Affect: Mood normal.        Behavior: Behavior normal.     CBC    Component Value Date/Time   WBC 5.3 03/03/2021 0823   WBC 3.2 (L) 02/04/2021 1900   RBC 4.18 (L) 03/03/2021 0823   HGB 13.7 03/03/2021 0823   HCT 39.8 03/03/2021 0823   PLT 215 03/03/2021 0823   MCV 95.2 03/03/2021 0823   MCH 32.8 03/03/2021 0823   MCHC 34.4 03/03/2021 0823   RDW 15.9 (H) 03/03/2021 0823   LYMPHSABS 0.9 03/03/2021 0823   MONOABS 0.8 03/03/2021 0823   EOSABS 0.2 03/03/2021 0823   BASOSABS 0.0 03/03/2021 0823    CHEMISTRY No results for input(s): NA, K, CL, CO2, GLUCOSE, BUN, CREATININE, CALCIUM, MG, PHOS in the last 168 hours. Estimated Creatinine Clearance: 73.1 mL/min (by C-G formula based on SCr of 1.01 mg/dL).   Chest Imaging- films  reviewed: CTA PE protocol 01/2021 with fibrotic changes around initial mass and enlarging nodular mass over 2 cm  CT PET 01/03/2019-severe emphysema, right hilar mass with air bronchograms, medial RLL cystic fibrotic changes.  PET avidity of the inferior portion of the mass and medial fibrotic areas of the lower lobe.  CT chest with contrast 04/10/2018-persistent right mass, unchanged right paraseptal emphysema versus cavitary lesions.  Improved adenopathy.  CT chest with contrast 10/06/2019-bilateral centrilobular and paraseptal emphysema.  Increased opacification of right lower lobe cystic area in the medial base compared to March 2021, but improved compared to June 2021 CT.  Persistence of hilar mass on the right.  New irregularly shaped opacity in the left upper lobe.  Increased areas of patchy groundglass and septal thickening in the right upper lobe.  Micro: 01/09/2019 AFB: Component 1 mo ago  Organism ID [id number]    Comment: Mycobacterium avium complex  Amikacin Comment   Comment: 16.0 ug/mL Susceptible  Clarithromycin 2.0 ug/mL Susceptible   Linezolid Comment   Comment: 16.0 ug/mL Intermediate  Moxifloxacin 4.0 ug/mL Resistant   Streptomycin 64.0 ug/mL    01/09/2019 fungus culture-negative 01/09/2019 BAL-many PMN, negative culture 06/17/2019 respiratory culture-normal flora  Pulmonary Functions Testing Results: PFT Results Latest Ref Rng & Units 12/21/2018  FVC-Pre L 3.07  FVC-Predicted Pre % 76  FVC-Post L 3.23  FVC-Predicted Post % 80  Pre FEV1/FVC % % 67  Post FEV1/FCV % % 71  FEV1-Pre L 2.07  FEV1-Predicted Pre % 68  FEV1-Post L 2.28  DLCO uncorrected ml/min/mmHg 15.47  DLCO UNC% % 64  DLVA Predicted % 79  TLC L 5.35  TLC % Predicted % 86  RV % Predicted % 104   2020- mild obstruction without significant bronchodilator reversibility.  No significant restriction, air trapping, or hyperinflation.  Mildly reduced diffusion.  Flow volume loop supports  obstruction.  Pathology 01/09/2019:  RLL adenocarcinoma November 2020 bronch nondiagnostic     Assessment & Plan:     ICD-10-CM   1. Acute septic pulmonary embolism, unspecified whether acute cor pulmonale present (HCC)  I26.90 rivaroxaban (XARELTO) 20 MG TABS tablet    2. COPD with acute bronchitis (HCC)  J44.0 Tiotropium Bromide-Olodaterol (STIOLTO RESPIMAT) 2.5-2.5 MCG/ACT AERS   J20.9        Lung adenocarcinoma- stage IIIc (T4N3M0).  Previously received carboplatin, paclitaxel, and radiation from Dec 2020-  mid March 2021, imfimzi 02 May 2019.  Unfortunate recurrence fall 2022 now on pemetrexed and Keytruda, carboplatin stopped after second cycle given anaphylaxis. -Ongoing management per oncology.   COPD- partial BD reversibility. Ongoing tobacco abuse. 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 Resistant to quinolones; intermediate susceptibility to linezolid.  Cough not a problem, low suspicion for active disease. -Avoid ICS and steroids as much as possible, may need to challenge with ICS in the future given partial bronchodilator response if dyspnea worsens -PLEASE AVOID MONOTHERAPY WITH MACROLIDES DUE TO RISK OF INDUCIBLE RESISTANCE OF MAI.  Tobacco abuse -Discussed importance of quitting smoking.  Recommend use of nicotine replacement therapy, especially using gum if stress is a situational trigger.  Pulmonary embolus:  -Provoked in setting malignancy.  Xarelto refilled today.  RTC in 6 months with Dr. [name].     Current Outpatient Medications:    albuterol (VENTOLIN HFA) 108 (90 Base) MCG/ACT inhaler, Inhale 2 puffs into the lungs every 4 (four) hours as needed for wheezing or shortness of breath., Disp: 18 g, Rfl: 6   atorvastatin (LIPITOR) 20 MG tablet, Take 1 tablet by mouth daily., Disp: , Rfl:    dextromethorphan-guaiFENesin (MUCINEX DM)  30-600 MG 12hr tablet, Take 1 tablet by mouth 2 (two) times daily as needed for cough., Disp: 40 tablet, Rfl: 0   famotidine (PEPCID) 20 MG tablet, TAKE 1 TABLET BY MOUTH TWICE A DAY, Disp: 180 tablet, Rfl: 1   fexofenadine (ALLEGRA) 180 MG tablet, Take 180 mg by mouth daily., Disp: , Rfl:    folic acid (FOLVITE) 1 MG tablet, Take 1 tablet (1 mg total) by mouth daily., Disp: 30 tablet, Rfl: 4   HYDROcodone-acetaminophen (NORCO) 5-325 MG tablet, Take 1 tablet by mouth every 6 (six) hours as needed for moderate pain., Disp: 5 tablet, Rfl: 0   naphazoline-pheniramine (VISINE) 0.025-0.3 % ophthalmic solution, 1 drop into both eyes as needed for red eye, Disp: , Rfl:    prochlorperazine (COMPAZINE) 10 MG tablet, Take 1 tablet (10 mg total) by mouth every 6 (six) hours as needed for nausea or vomiting., Disp: 30 tablet, Rfl: 0   rivaroxaban (XARELTO) 20 MG TABS tablet, Take 1 tablet (20 mg total) by mouth daily with supper., Disp: 30 tablet, Rfl: 11   Tiotropium Bromide-Olodaterol (STIOLTO RESPIMAT) 2.5-2.5 MCG/ACT AERS, Inhale 2 puffs into the lungs daily., Disp: 1 each, Rfl: 11    Lanier Clam, DO [facility] 03/12/2021 9:31 AM

## 2021-03-12 NOTE — Patient Instructions (Signed)
Nice to meet you  No changes in medication  I sent in a refill for the Stiolto and the Xarelto.  Let me know if you need anything, call at any time  Return to clinic in 6 months or sooner as needed with Dr. [name]

## 2021-03-17 ENCOUNTER — Other Ambulatory Visit: Payer: Self-pay | Admitting: Internal Medicine

## 2021-03-17 ENCOUNTER — Encounter (HOSPITAL_COMMUNITY): Payer: Self-pay

## 2021-03-17 ENCOUNTER — Other Ambulatory Visit: Payer: No Typology Code available for payment source

## 2021-03-17 ENCOUNTER — Ambulatory Visit (HOSPITAL_COMMUNITY)
Admission: RE | Admit: 2021-03-17 | Discharge: 2021-03-17 | Disposition: A | Discharge status: Home / Self Care | Payer: No Typology Code available for payment source | Source: Ambulatory Visit | Attending: Internal Medicine | Admitting: Internal Medicine

## 2021-03-17 ENCOUNTER — Other Ambulatory Visit: Payer: Self-pay

## 2021-03-17 DIAGNOSIS — C3490 Malignant neoplasm of unspecified part of unspecified bronchus or lung: Secondary | ICD-10-CM | POA: Diagnosis present

## 2021-03-17 MED ORDER — SODIUM CHLORIDE (PF) 0.9 % IJ SOLN
INTRAMUSCULAR | Status: AC
  Filled 2021-03-17: qty 50

## 2021-03-17 MED ORDER — IOHEXOL 300 MG/ML  SOLN
100.0000 mL | Freq: Once | INTRAMUSCULAR | Status: AC | PRN
  Administered 2021-03-17: 100 mL via INTRAVENOUS

## 2021-03-24 ENCOUNTER — Inpatient Hospital Stay (HOSPITAL_BASED_OUTPATIENT_CLINIC_OR_DEPARTMENT_OTHER): Payer: No Typology Code available for payment source | Admitting: Internal Medicine

## 2021-03-24 ENCOUNTER — Inpatient Hospital Stay: Payer: No Typology Code available for payment source

## 2021-03-24 ENCOUNTER — Other Ambulatory Visit: Payer: Self-pay

## 2021-03-24 ENCOUNTER — Encounter: Payer: Self-pay | Admitting: Internal Medicine

## 2021-03-24 ENCOUNTER — Inpatient Hospital Stay: Payer: No Typology Code available for payment source | Attending: Internal Medicine

## 2021-03-24 VITALS — BP 123/85 | HR 98 | Temp 96.8°F | Resp 18 | Ht 66.0 in | Wt 190.4 lb

## 2021-03-24 DIAGNOSIS — J432 Centrilobular emphysema: Secondary | ICD-10-CM | POA: Diagnosis not present

## 2021-03-24 DIAGNOSIS — Z5112 Encounter for antineoplastic immunotherapy: Secondary | ICD-10-CM | POA: Diagnosis not present

## 2021-03-24 DIAGNOSIS — D3502 Benign neoplasm of left adrenal gland: Secondary | ICD-10-CM | POA: Diagnosis not present

## 2021-03-24 DIAGNOSIS — D3501 Benign neoplasm of right adrenal gland: Secondary | ICD-10-CM | POA: Insufficient documentation

## 2021-03-24 DIAGNOSIS — C3490 Malignant neoplasm of unspecified part of unspecified bronchus or lung: Secondary | ICD-10-CM

## 2021-03-24 DIAGNOSIS — C3431 Malignant neoplasm of lower lobe, right bronchus or lung: Secondary | ICD-10-CM | POA: Insufficient documentation

## 2021-03-24 DIAGNOSIS — Z79899 Other long term (current) drug therapy: Secondary | ICD-10-CM | POA: Insufficient documentation

## 2021-03-24 DIAGNOSIS — C3491 Malignant neoplasm of unspecified part of right bronchus or lung: Secondary | ICD-10-CM

## 2021-03-24 DIAGNOSIS — Z9221 Personal history of antineoplastic chemotherapy: Secondary | ICD-10-CM | POA: Diagnosis not present

## 2021-03-24 DIAGNOSIS — Z86711 Personal history of pulmonary embolism: Secondary | ICD-10-CM | POA: Insufficient documentation

## 2021-03-24 DIAGNOSIS — Z5111 Encounter for antineoplastic chemotherapy: Secondary | ICD-10-CM

## 2021-03-24 DIAGNOSIS — E785 Hyperlipidemia, unspecified: Secondary | ICD-10-CM | POA: Insufficient documentation

## 2021-03-24 LAB — CBC WITH DIFFERENTIAL (CANCER CENTER ONLY)
Abs Immature Granulocytes: 0.02 10*3/uL (ref 0.00–0.07)
Basophils Absolute: 0.1 10*3/uL (ref 0.0–0.1)
Basophils Relative: 1 %
Eosinophils Absolute: 0.2 10*3/uL (ref 0.0–0.5)
Eosinophils Relative: 3 %
HCT: 40.9 % (ref 39.0–52.0)
Hemoglobin: 14 g/dL (ref 13.0–17.0)
Immature Granulocytes: 0 %
Lymphocytes Relative: 18 %
Lymphs Abs: 0.9 10*3/uL (ref 0.7–4.0)
MCH: 33.3 pg (ref 26.0–34.0)
MCHC: 34.2 g/dL (ref 30.0–36.0)
MCV: 97.4 fL (ref 80.0–100.0)
Monocytes Absolute: 0.4 10*3/uL (ref 0.1–1.0)
Monocytes Relative: 9 %
Neutro Abs: 3.3 10*3/uL (ref 1.7–7.7)
Neutrophils Relative %: 69 %
Platelet Count: 239 10*3/uL (ref 150–400)
RBC: 4.2 MIL/uL — ABNORMAL LOW (ref 4.22–5.81)
RDW: 14.8 % (ref 11.5–15.5)
WBC Count: 4.8 10*3/uL (ref 4.0–10.5)
nRBC: 0 % (ref 0.0–0.2)

## 2021-03-24 LAB — CMP (CANCER CENTER ONLY)
ALT: 21 U/L (ref 0–44)
AST: 22 U/L (ref 15–41)
Albumin: 3.9 g/dL (ref 3.5–5.0)
Alkaline Phosphatase: 85 U/L (ref 38–126)
Anion gap: 8 (ref 5–15)
BUN: 19 mg/dL (ref 8–23)
CO2: 23 mmol/L (ref 22–32)
Calcium: 9.1 mg/dL (ref 8.9–10.3)
Chloride: 106 mmol/L (ref 98–111)
Creatinine: 1 mg/dL (ref 0.61–1.24)
GFR, Estimated: >60 mL/min (ref >60)
Glucose, Bld: 163 mg/dL — ABNORMAL HIGH (ref 70–99)
Potassium: 4.1 mmol/L (ref 3.5–5.1)
Sodium: 137 mmol/L (ref 135–145)
Total Bilirubin: 0.4 mg/dL (ref 0.3–1.2)
Total Protein: 7.1 g/dL (ref 6.5–8.1)

## 2021-03-24 LAB — TSH: TSH: 3.816 u[IU]/mL (ref 0.320–4.118)

## 2021-03-24 MED ORDER — SODIUM CHLORIDE 0.9 % IV SOLN
200.0000 mg | Freq: Once | INTRAVENOUS | Status: AC
  Administered 2021-03-24: 200 mg via INTRAVENOUS
  Filled 2021-03-24: qty 200

## 2021-03-24 MED ORDER — SODIUM CHLORIDE 0.9 % IV SOLN: Freq: Once | INTRAVENOUS | Status: AC

## 2021-03-24 MED ORDER — PROCHLORPERAZINE MALEATE 10 MG PO TABS
10.0000 mg | ORAL_TABLET | Freq: Once | ORAL | Status: AC
  Administered 2021-03-24: 10 mg via ORAL
  Filled 2021-03-24: qty 1

## 2021-03-24 MED ORDER — SODIUM CHLORIDE 0.9 % IV SOLN
500.0000 mg/m2 | Freq: Once | INTRAVENOUS | Status: AC
  Administered 2021-03-24: 1000 mg via INTRAVENOUS
  Filled 2021-03-24: qty 40

## 2021-03-24 NOTE — Patient Instructions (Signed)
[facility]  Discharge Instructions: Thank you for choosing [facility] to provide your oncology and hematology care.   If you have a lab appointment with the Cancer Center, please go directly to the Cancer Center and check in at the registration area.   Wear comfortable clothing and clothing appropriate for easy access to any Portacath or PICC line.   We strive to give you quality time with your provider. You may need to reschedule your appointment if you arrive late (15 or more minutes).  Arriving late affects you and other patients whose appointments are after yours.  Also, if you miss three or more appointments without notifying the office, you may be dismissed from the clinic at the provider's discretion.      For prescription refill requests, have your pharmacy contact our office and allow 72 hours for refills to be completed.    Today you received the following chemotherapy and/or immunotherapy agents: Keytruda/Alimta.      To help prevent nausea and vomiting after your treatment, we encourage you to take your nausea medication as directed.  BELOW ARE SYMPTOMS THAT SHOULD BE REPORTED IMMEDIATELY: *FEVER GREATER THAN 100.4 F (38 C) OR HIGHER *CHILLS OR SWEATING *NAUSEA AND VOMITING THAT IS NOT CONTROLLED WITH YOUR NAUSEA MEDICATION *UNUSUAL SHORTNESS OF BREATH *UNUSUAL BRUISING OR BLEEDING *URINARY PROBLEMS (pain or burning when urinating, or frequent urination) *BOWEL PROBLEMS (unusual diarrhea, constipation, pain near the anus) TENDERNESS IN MOUTH AND THROAT WITH OR WITHOUT PRESENCE OF ULCERS (sore throat, sores in mouth, or a toothache) UNUSUAL RASH, SWELLING OR PAIN  UNUSUAL VAGINAL DISCHARGE OR ITCHING   Items with * indicate a potential emergency and should be followed up as soon as possible or go to the Emergency Department if any problems should occur.  Please show the CHEMOTHERAPY ALERT CARD or IMMUNOTHERAPY ALERT CARD at  check-in to the Emergency Department and triage nurse.  Should you have questions after your visit or need to cancel or reschedule your appointment, please contact [facility]  Dept: [phone]  and follow the prompts.  Office hours are 8:00 a.m. to 4:30 p.m. Monday - Friday. Please note that voicemails left after 4:00 p.m. may not be returned until the following business day.  We are closed weekends and major holidays. You have access to a nurse at all times for urgent questions. Please call the main number to the clinic Dept: [phone] and follow the prompts.   For any non-urgent questions, you may also contact your provider using MyChart. We now offer e-Visits for anyone 18 and older to request care online for non-urgent symptoms. For details visit [email or web address].   Also download the MyChart app! Go to the app store, search "MyChart", open the app, select , and log in with your MyChart username and password.  Due to Covid, a mask is required upon entering the hospital/clinic. If you do not have a mask, one will be given to you upon arrival. For doctor visits, patients may have 1 support person aged 18 or older with them. For treatment visits, patients cannot have anyone with them due to current Covid guidelines and our immunocompromised population.   

## 2021-03-24 NOTE — Progress Notes (Signed)
[place] [phone]   [phone]  OFFICE PROGRESS NOTE  [name], MD [address] [place] [address]  DIAGNOSIS:  1) recurrent non-small cell lung cancer initially diagnosed as stage IIIc (T4, N3, M0) non-small cell lung cancer, adenocarcinoma presented with large right hilar mass in addition to bilateral hilar and mediastinal lymphadenopathy diagnosed in December 2020.  The patient has evidence for disease recurrence in October 2022. 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  Started on Xarelto on March 17, 2020 and currently 20 mg p.o. daily.   PRIOR THERAPY:  1) Weekly concurrent chemoradiation with carboplatin for an AUC of 2 and paclitaxel 45 mg/m2. First dose starting 01/30/2019.   Status post 6 cycles. 2) Consolidation immunotherapy with Imfinzi 1500 mg IV every 4 weeks. First dose expected on 04/19/2019.  Status post 13 cycles. 3) disease recurrence in October 2022.   CURRENT THERAPY: Systemic chemotherapy with carboplatin for AUC of 5, Alimta 500 Mg/M2 and Keytruda 200 Mg IV every 3 weeks.  First dose January 01, 2021.  Status post 5 cycles.  He had hypersensitivity reaction to carboplatin and this was discontinued after cycle #2.  INTERVAL HISTORY: Philip [name] 66 y.o. male returns to the clinic today for follow-up visit.  The patient is feeling fine today with no concerning complaints except for intermittent watery eye likely secondary to his treatment with Alimta.  The patient denied having any current chest pain, shortness of breath, cough or hemoptysis.  He denied having any fever or chills.  He has no nausea, vomiting, diarrhea or constipation.  He denied having any headache or visual changes.  He has no recent weight loss or night sweats.  He had repeat CT scan of the chest, abdomen pelvis performed  recently and he is here for evaluation and discussion of his scan results.   MEDICAL HISTORY: Past Medical History:  Diagnosis Date   Hyperlipidemia    lung ca dx'd 12/2018   Pneumonia    Pulmonary embolus (HCC)    Tobacco abuse     ALLERGIES:  is allergic to carboplatin, clonazepam, and norco [hydrocodone-acetaminophen].  MEDICATIONS:  Current Outpatient Medications  Medication Sig Dispense Refill   albuterol (VENTOLIN HFA) 108 (90 Base) MCG/ACT inhaler Inhale 2 puffs into the lungs every 4 (four) hours as needed for wheezing or shortness of breath. 18 g 6   atorvastatin (LIPITOR) 20 MG tablet Take 1 tablet by mouth daily.     famotidine (PEPCID) 20 MG tablet TAKE 1 TABLET BY MOUTH TWICE A DAY 180 tablet 1   fexofenadine (ALLEGRA) 180 MG tablet Take 180 mg by mouth daily.     folic acid (FOLVITE) 1 MG tablet TAKE 1 TABLET BY MOUTH EVERY DAY 90 tablet 1   dextromethorphan-guaiFENesin (MUCINEX DM) 30-600 MG 12hr tablet Take 1 tablet by mouth 2 (two) times daily as needed for cough. 40 tablet 0   HYDROcodone-acetaminophen (NORCO) 5-325 MG tablet Take 1 tablet by mouth every 6 (six) hours as needed for moderate pain. 5 tablet 0   naphazoline-pheniramine (VISINE) 0.025-0.3 % ophthalmic solution 1 drop into both eyes as needed for red eye     prochlorperazine (COMPAZINE) 10 MG tablet Take 1 tablet (10 mg total) by mouth every 6 (six) hours as needed for nausea or vomiting. 30 tablet 0   rivaroxaban (XARELTO) 20 MG TABS  tablet Take 1 tablet (20 mg total) by mouth daily with supper. 30 tablet 11   Tiotropium Bromide-Olodaterol (STIOLTO RESPIMAT) 2.5-2.5 MCG/ACT AERS Inhale 2 puffs into the lungs daily. (Patient not taking: Reported on 03/24/2021) 1 each 11   No current facility-administered medications for this visit.    SURGICAL HISTORY:  Past Surgical History:  Procedure Laterality Date   COLONOSCOPY     HAND SURGERY     VIDEO BRONCHOSCOPY WITH ENDOBRONCHIAL ULTRASOUND N/A 12/22/2018    Procedure: VIDEO BRONCHOSCOPY WITH ENDOBRONCHIAL ULTRASOUND;  Surgeon: Garner Nash, DO;  Location: [facility];  Service: Thoracic;  Laterality: N/A;   VIDEO BRONCHOSCOPY WITH ENDOBRONCHIAL ULTRASOUND N/A 01/09/2019   Procedure: VIDEO BRONCHOSCOPY WITH ENDOBRONCHIAL ULTRASOUND WITH FLUORO;  Surgeon: Garner Nash, DO;  Location: [place] [facility];  Service: Thoracic;  Laterality: N/A;   VIDEO BRONCHOSCOPY WITH RADIAL ENDOBRONCHIAL ULTRASOUND N/A 12/22/2018   Procedure: RADIAL ENDOBRONCHIAL ULTRASOUND;  Surgeon: Garner Nash, DO;  Location: [facility];  Service: Thoracic;  Laterality: N/A;    REVIEW OF SYSTEMS:  Constitutional: positive for fatigue Eyes: positive for tearing Ears, nose, mouth, throat, and face: negative Respiratory: negative Cardiovascular: negative Gastrointestinal: negative Genitourinary:negative Integument/breast: negative Hematologic/lymphatic: negative Musculoskeletal:negative Neurological: negative Behavioral/Psych: negative Endocrine: negative Allergic/Immunologic: negative   PHYSICAL EXAMINATION: General appearance: alert, cooperative, fatigued, and no distress Head: Normocephalic, without obvious abnormality, atraumatic Neck: no adenopathy, no JVD, supple, symmetrical, trachea midline, and thyroid not enlarged, symmetric, no tenderness/mass/nodules Lymph nodes: Cervical, supraclavicular, and axillary nodes normal. Resp: clear to auscultation bilaterally Back: symmetric, no curvature. ROM normal. No CVA tenderness. Cardio: regular rate and rhythm, S1, S2 normal, no murmur, click, rub or gallop GI: soft, non-tender; bowel sounds normal; no masses,  no organomegaly Extremities: extremities normal, atraumatic, no cyanosis or edema Neurologic: Alert and oriented X 3, normal strength and tone. Normal symmetric reflexes. Normal coordination and gait  ECOG PERFORMANCE STATUS: 1 - Symptomatic but completely ambulatory  Blood pressure 123/85, pulse 98, temperature (!) 96.8 F  (36 C), temperature source Tympanic, resp. rate 18, height 5\' 6"  (1.676 m), weight 190 lb 6.4 oz (86.4 kg), SpO2 98 %.  LABORATORY DATA: Lab Results  Component Value Date   WBC 4.8 03/24/2021   HGB 14.0 03/24/2021   HCT 40.9 03/24/2021   MCV 97.4 03/24/2021   PLT 239 03/24/2021      Chemistry      Component Value Date/Time   NA 138 03/03/2021 0823   K 4.3 03/03/2021 0823   CL 105 03/03/2021 0823   CO2 26 03/03/2021 0823   BUN 14 03/03/2021 0823   CREATININE 1.01 03/03/2021 0823      Component Value Date/Time   CALCIUM 9.1 03/03/2021 0823   ALKPHOS 72 03/03/2021 0823   AST 24 03/03/2021 0823   ALT 30 03/03/2021 0823   BILITOT 0.5 03/03/2021 0823       RADIOGRAPHIC STUDIES: CT Chest W Contrast  Result Date: 03/17/2021 CLINICAL DATA:  Non-small-cell lung cancer.  Restaging. EXAM: CT CHEST, ABDOMEN, AND PELVIS WITH CONTRAST TECHNIQUE: Multidetector CT imaging of the chest, abdomen and pelvis was performed following the standard protocol during bolus administration of intravenous contrast. RADIATION DOSE REDUCTION: This exam was performed according to the departmental dose-optimization program which includes automated exposure control, adjustment of the mA and/or kV according to patient size and/or use of iterative reconstruction technique. CONTRAST:  137mL OMNIPAQUE IOHEXOL 300 MG/ML  SOLN COMPARISON:  CTA chest 01/15/2021.  PET-CT 11/27/2020. FINDINGS: CT CHEST FINDINGS Cardiovascular: The heart  size is normal. No substantial pericardial effusion. Coronary artery calcification is evident. Mild atherosclerotic calcification is noted in the wall of the thoracic aorta. As noted on prior imaging, the interlobar pulmonary artery and right inferior pulmonary vein appear truncated. Mediastinum/Nodes: No mediastinal lymphadenopathy. Small prevascular lymph nodes are stable in the interval there is no hilar lymphadenopathy. The esophagus has normal imaging features. There is no axillary  lymphadenopathy. 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 18 mm soft tissue nodule in the right hilum on 33/2 is similar to prior. 12 mm nodular suprahilar opacity on 52/6 is stable. Previously measured right lower lobe nodular component at 2.4 cm on 01/05/2021 is now 1.7 cm on image 74/6. Similar appearance of scarring anteromedial left upper lobe. Centrilobular and paraseptal emphysema evident. No new suspicious pulmonary nodule or mass.  No pleural effusion. Musculoskeletal: No worrisome lytic or sclerotic osseous abnormality. CT ABDOMEN PELVIS FINDINGS Hepatobiliary: No suspicious focal abnormality within the liver parenchyma. There is no evidence for gallstones, gallbladder wall thickening, or pericholecystic fluid. No intrahepatic or extrahepatic biliary dilation. Pancreas: No focal mass lesion. No dilatation of the main duct. No intraparenchymal cyst. No peripancreatic edema. Spleen: No splenomegaly. No focal mass lesion. Adrenals/Urinary Tract: Previously characterized adrenal adenomas are stable, left greater than right. Left adrenal adenoma measures 2.4 cm today, stable since 01/15/2021. Prominent extrarenal pelvis right kidney, unchanged. No suspicious enhancing lesion evident in either kidney. No evidence for hydroureter. The urinary bladder appears normal for the degree of distention. Stomach/Bowel: Stomach is unremarkable. No gastric wall thickening. No evidence of outlet obstruction. Duodenum is normally positioned as is the ligament of Treitz. No small bowel wall thickening. No small bowel dilatation. The terminal ileum is normal. The appendix is not well visualized, but there is no edema or inflammation in the region of the cecum. No gross colonic mass. No colonic wall thickening. Vascular/Lymphatic: There is mild atherosclerotic calcification of the abdominal aorta without aneurysm. There is no gastrohepatic or hepatoduodenal ligament lymphadenopathy. No  retroperitoneal or mesenteric lymphadenopathy. No pelvic sidewall lymphadenopathy. Reproductive: The prostate gland and seminal vesicles are unremarkable. Other: No intraperitoneal free fluid. Musculoskeletal: No worrisome lytic or sclerotic osseous abnormality. IMPRESSION: 1. Stable exam. No new or progressive findings. Non opacification of the interlobar pulmonary artery in the right inferior pulmonary vein is again noted. 2. 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 3. Stable bilateral adrenal adenomas. 4. Aortic Atherosclerosis (ICD10-I70.0) and Emphysema (ICD10-J43.9). Electronically Signed   By: Misty Stanley M.D.   On: 03/17/2021 14:25   CT Abdomen Pelvis W Contrast  Result Date: 03/17/2021 CLINICAL DATA:  Non-small-cell lung cancer.  Restaging. EXAM: CT CHEST, ABDOMEN, AND PELVIS WITH CONTRAST TECHNIQUE: Multidetector CT imaging of the chest, abdomen and pelvis was performed following the standard protocol during bolus administration of intravenous contrast. RADIATION DOSE REDUCTION: This exam was performed according to the departmental dose-optimization program which includes automated exposure control, adjustment of the mA and/or kV according to patient size and/or use of iterative reconstruction technique. CONTRAST:  170mL OMNIPAQUE IOHEXOL 300 MG/ML  SOLN COMPARISON:  CTA chest 01/15/2021.  PET-CT 11/27/2020. FINDINGS: CT CHEST FINDINGS Cardiovascular: The heart size is normal. No substantial pericardial effusion. Coronary artery calcification is evident. Mild atherosclerotic calcification is noted in the wall of the thoracic aorta. As noted on prior imaging, the interlobar pulmonary artery and right inferior pulmonary vein appear truncated. Mediastinum/Nodes: No mediastinal lymphadenopathy. Small prevascular lymph nodes are stable in  the interval there is no hilar lymphadenopathy. The esophagus has normal imaging features. There is no axillary lymphadenopathy.  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 18 mm soft tissue nodule in the right hilum on 33/2 is similar to prior. 12 mm nodular suprahilar opacity on 52/6 is stable. Previously measured right lower lobe nodular component at 2.4 cm on 01/05/2021 is now 1.7 cm on image 74/6. Similar appearance of scarring anteromedial left upper lobe. Centrilobular and paraseptal emphysema evident. No new suspicious pulmonary nodule or mass.  No pleural effusion. Musculoskeletal: No worrisome lytic or sclerotic osseous abnormality. CT ABDOMEN PELVIS FINDINGS Hepatobiliary: No suspicious focal abnormality within the liver parenchyma. There is no evidence for gallstones, gallbladder wall thickening, or pericholecystic fluid. No intrahepatic or extrahepatic biliary dilation. Pancreas: No focal mass lesion. No dilatation of the main duct. No intraparenchymal cyst. No peripancreatic edema. Spleen: No splenomegaly. No focal mass lesion. Adrenals/Urinary Tract: Previously characterized adrenal adenomas are stable, left greater than right. Left adrenal adenoma measures 2.4 cm today, stable since 01/15/2021. Prominent extrarenal pelvis right kidney, unchanged. No suspicious enhancing lesion evident in either kidney. No evidence for hydroureter. The urinary bladder appears normal for the degree of distention. Stomach/Bowel: Stomach is unremarkable. No gastric wall thickening. No evidence of outlet obstruction. Duodenum is normally positioned as is the ligament of Treitz. No small bowel wall thickening. No small bowel dilatation. The terminal ileum is normal. The appendix is not well visualized, but there is no edema or inflammation in the region of the cecum. No gross colonic mass. No colonic wall thickening. Vascular/Lymphatic: There is mild atherosclerotic calcification of the abdominal aorta without aneurysm. There is no gastrohepatic or hepatoduodenal ligament lymphadenopathy. No retroperitoneal or  mesenteric lymphadenopathy. No pelvic sidewall lymphadenopathy. Reproductive: The prostate gland and seminal vesicles are unremarkable. Other: No intraperitoneal free fluid. Musculoskeletal: No worrisome lytic or sclerotic osseous abnormality. IMPRESSION: 1. Stable exam. No new or progressive findings. Non opacification of the interlobar pulmonary artery in the right inferior pulmonary vein is again noted. 2. 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 3. Stable bilateral adrenal adenomas. 4. Aortic Atherosclerosis (ICD10-I70.0) and Emphysema (ICD10-J43.9). Electronically Signed   By: Misty Stanley M.D.   On: 03/17/2021 14:25     ASSESSMENT AND PLAN: This is a very pleasant 66 years old white male with a stage IIIc non-small cell lung cancer, adenocarcinoma diagnosed in December 2020 The patient completed a course of concurrent chemoradiation with weekly carboplatin and paclitaxel status post 6 cycles and he tolerated his treatment well and has partial response. He underwent consolidation treatment with immunotherapy with Imfinzi 1500 mg IV every 4 weeks status post 13 cycles. He was on observation and feeling fine with no concerning complaints except for mild cough as well as the left shoulder pain. His scan showed increase in the size of the soft tissue nodule in the right middle lobe adjacent to the postradiation changes highly suspicious for recurrent disease.  There was also soft tissue of the right hilum/as ago esophageal recess unchanged at but increased compared to remote prior studies. The patient had a PET scan that showed hypermetabolic nodule in the right lower lobe adjacent to the post radiation treatment site increased in size and consistent with lung cancer recurrence.  There was second hypermetabolic nodule in the right lower lobe above the diaphragm concerning for recurrence and hypermetabolic contralateral lymph node in the high left paratracheal  mediastinum also concerning for  metastatic adenopathy recurrence. He started systemic chemotherapy with carboplatin for AUC of 5, Alimta 500 Mg/M2 and Keytruda 200 Mg IV every 3 weeks on January 01, 2021.   Carboplatin was discontinued with cycle #2 secondary to hypersensitivity reaction.  The patient is status post 4 cycles. The patient has been tolerating this treatment well with no concerning adverse effects. He had repeat CT scan of the chest, abdomen pelvis performed recently.  I personally and independently reviewed the scans and discussed the results with the patient today. His scan showed no concerning findings for disease progression. I recommended for him to continue his current treatment with maintenance Alimta and Keytruda and he will proceed with cycle #5 today. I will see the patient back for follow-up visit in 3 weeks for evaluation before starting cycle #6. For the history of pulmonary embolism, he will continue on Xarelto. The patient was advised to call immediately if he has any other concerning symptoms in the interval.  The patient voices understanding of current disease status and treatment options and is in agreement with the current care plan. All questions were answered. The patient knows to call the clinic with any problems, questions or concerns. We can certainly see the patient much sooner if necessary.  Disclaimer: This note was dictated with voice recognition software. Similar sounding words can inadvertently be transcribed and may not be corrected upon review.

## 2021-03-31 ENCOUNTER — Other Ambulatory Visit: Payer: No Typology Code available for payment source

## 2021-04-07 ENCOUNTER — Other Ambulatory Visit: Payer: No Typology Code available for payment source

## 2021-04-09 NOTE — Progress Notes (Signed)
[facility] OFFICE PROGRESS NOTE  [name], MD [address] [place] [id number]  DIAGNOSIS:  1) recurrent non-small cell lung cancer initially diagnosed as stage IIIc (T4, N3, M0) non-small cell lung cancer, adenocarcinoma presented with large right hilar mass in addition to bilateral hilar and mediastinal lymphadenopathy diagnosed in December 2020.  The patient has evidence for disease recurrence in October 2022. 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  Started on Xarelto on March 17, 2020 and currently 20 mg p.o. daily.   No actionable mutations by Guardant 360  PRIOR THERAPY: 1) Weekly concurrent chemoradiation with carboplatin for an AUC of 2 and paclitaxel 45 mg/m2. First dose starting 01/30/2019.   Status post 6 cycles. 2) Consolidation immunotherapy with Imfinzi 1500 mg IV every 4 weeks. First dose expected on 04/19/2019.  Status post 13 cycles. 3) disease recurrence in October 2022.  CURRENT THERAPY: Systemic chemotherapy with carboplatin for AUC of 5, Alimta 500 Mg/M2 and Keytruda 200 Mg IV every 3 weeks.  First dose January 01, 2021. Status post 5 cycles. He started maintenance alimta and [place] starting from cycle #5.   INTERVAL HISTORY: Philip [name] 66 y.o. male returns to the clinic today for a follow-up visit.  The patient is feeling well today without any concerning complaints except he notes for the last few months, he feels like food/pills get stuck in his throat when he swallows. He cannot recall who his gastroenterologist is.   He is tolerating his maintenance alimta and [place] well. He reports his last cycle was easier due to being maintenance treatment.  Overall, the patient denies any fever, chills, weight change. He has some intermittent night sweats, but none lately.  He reports some fatigue and dyspnea on exertion a  mild cough. However, his cough is better recently. He denies any chest pain or hemoptysis.  Denies any nausea, vomiting, diarrhea, or constipation.  Denies any headaches. He is overdue for an eye exam and states he has not had one in over a decade. Therefore, he has some visual blurring. He denies rashes or skin changes. He still continues to smoke and drink alcohol. He is here today for evaluation before starting cycle #6.    MEDICAL HISTORY: Past Medical History:  Diagnosis Date   Hyperlipidemia    lung ca dx'd 12/2018   Pneumonia    Pulmonary embolus (HCC)    Tobacco abuse     ALLERGIES:  is allergic to carboplatin, clonazepam, and norco [hydrocodone-acetaminophen].  MEDICATIONS:  Current Outpatient Medications  Medication Sig Dispense Refill   albuterol (VENTOLIN HFA) 108 (90 Base) MCG/ACT inhaler Inhale 2 puffs into the lungs every 4 (four) hours as needed for wheezing or shortness of breath. 18 g 6   atorvastatin (LIPITOR) 20 MG tablet Take 1 tablet by mouth daily.     dextromethorphan-guaiFENesin (MUCINEX DM) 30-600 MG 12hr tablet Take 1 tablet by mouth 2 (two) times daily as needed for cough. 40 tablet 0   famotidine (PEPCID) 20 MG tablet TAKE 1 TABLET BY MOUTH TWICE A DAY 180 tablet 1   fexofenadine (ALLEGRA) 180 MG tablet Take 180 mg by mouth daily.     folic acid (FOLVITE) 1 MG tablet TAKE 1 TABLET BY MOUTH EVERY DAY 90 tablet 1   naphazoline-pheniramine (VISINE) 0.025-0.3 % ophthalmic solution 1 drop into both eyes as needed for red eye  rivaroxaban (XARELTO) 20 MG TABS tablet Take 1 tablet (20 mg total) by mouth daily with supper. 30 tablet 11   Tiotropium Bromide-Olodaterol (STIOLTO RESPIMAT) 2.5-2.5 MCG/ACT AERS Inhale 2 puffs into the lungs daily. 1 each 11   HYDROcodone-acetaminophen (NORCO) 5-325 MG tablet Take 1 tablet by mouth every 6 (six) hours as needed for moderate pain. (Patient not taking: Reported on 04/14/2021) 5 tablet 0   prochlorperazine (COMPAZINE) 10 MG  tablet Take 1 tablet (10 mg total) by mouth every 6 (six) hours as needed for nausea or vomiting. (Patient not taking: Reported on 04/14/2021) 30 tablet 0   No current facility-administered medications for this visit.    SURGICAL HISTORY:  Past Surgical History:  Procedure Laterality Date   COLONOSCOPY     HAND SURGERY     VIDEO BRONCHOSCOPY WITH ENDOBRONCHIAL ULTRASOUND N/A 12/22/2018   Procedure: VIDEO BRONCHOSCOPY WITH ENDOBRONCHIAL ULTRASOUND;  Surgeon: Garner Nash, DO;  Location: [facility];  Service: Thoracic;  Laterality: N/A;   VIDEO BRONCHOSCOPY WITH ENDOBRONCHIAL ULTRASOUND N/A 01/09/2019   Procedure: VIDEO BRONCHOSCOPY WITH ENDOBRONCHIAL ULTRASOUND WITH FLUORO;  Surgeon: Garner Nash, DO;  Location: [facility];  Service: Thoracic;  Laterality: N/A;   VIDEO BRONCHOSCOPY WITH RADIAL ENDOBRONCHIAL ULTRASOUND N/A 12/22/2018   Procedure: RADIAL ENDOBRONCHIAL ULTRASOUND;  Surgeon: Garner Nash, DO;  Location: [facility];  Service: Thoracic;  Laterality: N/A;    REVIEW OF SYSTEMS:   Constitutional: Positive for fatigue. Negative for appetite change, chills, fever and unexpected weight change.  HENT: Positive for dysphagia. Negative for mouth sores, nosebleeds, sore throat and trouble swallowing.   Eyes: Negative for eye problems and icterus.  Respiratory: Positive for improved intermittent cough and stable dyspnea on exertion. Negative for hemoptysis and wheezing.   Cardiovascular: Negative for chest pain and leg swelling.  Gastrointestinal: Negative for abdominal pain, constipation, diarrhea, nausea and vomiting.  Genitourinary: Negative for bladder incontinence, difficulty urinating, dysuria, frequency and hematuria.   Musculoskeletal: Negative for back pain, gait problem, neck pain and neck stiffness.  Skin: Negative for itching and rash.  Neurological: Negative for dizziness, extremity weakness, gait problem, headaches, light-headedness and seizures.  Hematological: Negative for  adenopathy. Does not bruise/bleed easily.  Psychiatric/Behavioral: Negative for confusion, depression and sleep disturbance. The patient is not nervous/anxious.       PHYSICAL EXAMINATION:  Blood pressure (!) 115/95, pulse 88, temperature 97.6 F (36.4 C), temperature source Tympanic, resp. rate 16, height 5\' 6"  (1.676 m), weight 189 lb 9.6 oz (86 kg), SpO2 99 %.  ECOG PERFORMANCE STATUS: 1  Physical Exam  Constitutional: Oriented to person, place, and time and well-developed, well-nourished, and in no distress.  HENT:  Head: Normocephalic and atraumatic.  Mouth/Throat: Oropharynx is clear and moist. No oropharyngeal exudate.  Eyes: Conjunctivae are normal. Right eye exhibits no discharge. Left eye exhibits no discharge. No scleral icterus.  Neck: Normal range of motion. Neck supple.  Cardiovascular: Normal rate, regular rhythm, normal heart sounds and intact distal pulses.   Pulmonary/Chest: Effort normal and breath sounds normal. No respiratory distress. No wheezes. No rales.  Abdominal: Soft. Bowel sounds are normal. Exhibits no distension and no mass. There is no tenderness.  Musculoskeletal: Normal range of motion. Exhibits no edema.  Lymphadenopathy:    No cervical adenopathy.  Neurological: Alert and oriented to person, place, and time. Exhibits normal muscle tone. Gait normal. Coordination normal.  Skin: Skin is warm and dry. No rash noted. Not diaphoretic. No erythema. No pallor.  Psychiatric: Mood, memory and  judgment normal.  Vitals reviewed.  LABORATORY DATA: Lab Results  Component Value Date   WBC 4.6 04/14/2021   HGB 15.4 04/14/2021   HCT 45.4 04/14/2021   MCV 97.8 04/14/2021   PLT 250 04/14/2021      Chemistry      Component Value Date/Time   NA 137 03/24/2021 0825   K 4.1 03/24/2021 0825   CL 106 03/24/2021 0825   CO2 23 03/24/2021 0825   BUN 19 03/24/2021 0825   CREATININE 1.00 03/24/2021 0825      Component Value Date/Time   CALCIUM 9.1 03/24/2021  0825   ALKPHOS 85 03/24/2021 0825   AST 22 03/24/2021 0825   ALT 21 03/24/2021 0825   BILITOT 0.4 03/24/2021 0825       RADIOGRAPHIC STUDIES:  CT Chest W Contrast  Result Date: 03/17/2021 CLINICAL DATA:  Non-small-cell lung cancer.  Restaging. EXAM: CT CHEST, ABDOMEN, AND PELVIS WITH CONTRAST TECHNIQUE: Multidetector CT imaging of the chest, abdomen and pelvis was performed following the standard protocol during bolus administration of intravenous contrast. RADIATION DOSE REDUCTION: This exam was performed according to the departmental dose-optimization program which includes automated exposure control, adjustment of the mA and/or kV according to patient size and/or use of iterative reconstruction technique. CONTRAST:  128mL OMNIPAQUE IOHEXOL 300 MG/ML  SOLN COMPARISON:  CTA chest 01/15/2021.  PET-CT 11/27/2020. FINDINGS: CT CHEST FINDINGS Cardiovascular: The heart size is normal. No substantial pericardial effusion. Coronary artery calcification is evident. Mild atherosclerotic calcification is noted in the wall of the thoracic aorta. As noted on prior imaging, the interlobar pulmonary artery and right inferior pulmonary vein appear truncated. Mediastinum/Nodes: No mediastinal lymphadenopathy. Small prevascular lymph nodes are stable in the interval there is no hilar lymphadenopathy. The esophagus has normal imaging features. There is no axillary lymphadenopathy. 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 18 mm soft tissue nodule in the right hilum on 33/2 is similar to prior. 12 mm nodular suprahilar opacity on 52/6 is stable. Previously measured right lower lobe nodular component at 2.4 cm on 01/05/2021 is now 1.7 cm on image 74/6. Similar appearance of scarring anteromedial left upper lobe. Centrilobular and paraseptal emphysema evident. No new suspicious pulmonary nodule or mass.  No pleural effusion. Musculoskeletal: No worrisome lytic or sclerotic osseous  abnormality. CT ABDOMEN PELVIS FINDINGS Hepatobiliary: No suspicious focal abnormality within the liver parenchyma. There is no evidence for gallstones, gallbladder wall thickening, or pericholecystic fluid. No intrahepatic or extrahepatic biliary dilation. Pancreas: No focal mass lesion. No dilatation of the main duct. No intraparenchymal cyst. No peripancreatic edema. Spleen: No splenomegaly. No focal mass lesion. Adrenals/Urinary Tract: Previously characterized adrenal adenomas are stable, left greater than right. Left adrenal adenoma measures 2.4 cm today, stable since 01/15/2021. Prominent extrarenal pelvis right kidney, unchanged. No suspicious enhancing lesion evident in either kidney. No evidence for hydroureter. The urinary bladder appears normal for the degree of distention. Stomach/Bowel: Stomach is unremarkable. No gastric wall thickening. No evidence of outlet obstruction. Duodenum is normally positioned as is the ligament of Treitz. No small bowel wall thickening. No small bowel dilatation. The terminal ileum is normal. The appendix is not well visualized, but there is no edema or inflammation in the region of the cecum. No gross colonic mass. No colonic wall thickening. Vascular/Lymphatic: There is mild atherosclerotic calcification of the abdominal aorta without aneurysm. There is no gastrohepatic or hepatoduodenal ligament lymphadenopathy. No retroperitoneal or mesenteric lymphadenopathy. No pelvic sidewall lymphadenopathy. Reproductive: The prostate gland and  seminal vesicles are unremarkable. Other: No intraperitoneal free fluid. Musculoskeletal: No worrisome lytic or sclerotic osseous abnormality. IMPRESSION: 1. Stable exam. No new or progressive findings. Non opacification of the interlobar pulmonary artery in the right inferior pulmonary vein is again noted. 2. 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 3. Stable bilateral adrenal adenomas. 4. Aortic  Atherosclerosis (ICD10-I70.0) and Emphysema (ICD10-J43.9). Electronically Signed   By: Misty Stanley M.D.   On: 03/17/2021 14:25   CT Abdomen Pelvis W Contrast  Result Date: 03/17/2021 CLINICAL DATA:  Non-small-cell lung cancer.  Restaging. EXAM: CT CHEST, ABDOMEN, AND PELVIS WITH CONTRAST TECHNIQUE: Multidetector CT imaging of the chest, abdomen and pelvis was performed following the standard protocol during bolus administration of intravenous contrast. RADIATION DOSE REDUCTION: This exam was performed according to the departmental dose-optimization program which includes automated exposure control, adjustment of the mA and/or kV according to patient size and/or use of iterative reconstruction technique. CONTRAST:  188mL OMNIPAQUE IOHEXOL 300 MG/ML  SOLN COMPARISON:  CTA chest 01/15/2021.  PET-CT 11/27/2020. FINDINGS: CT CHEST FINDINGS Cardiovascular: The heart size is normal. No substantial pericardial effusion. Coronary artery calcification is evident. Mild atherosclerotic calcification is noted in the wall of the thoracic aorta. As noted on prior imaging, the interlobar pulmonary artery and right inferior pulmonary vein appear truncated. Mediastinum/Nodes: No mediastinal lymphadenopathy. Small prevascular lymph nodes are stable in the interval there is no hilar lymphadenopathy. The esophagus has normal imaging features. There is no axillary lymphadenopathy. 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 18 mm soft tissue nodule in the right hilum on 33/2 is similar to prior. 12 mm nodular suprahilar opacity on 52/6 is stable. Previously measured right lower lobe nodular component at 2.4 cm on 01/05/2021 is now 1.7 cm on image 74/6. Similar appearance of scarring anteromedial left upper lobe. Centrilobular and paraseptal emphysema evident. No new suspicious pulmonary nodule or mass.  No pleural effusion. Musculoskeletal: No worrisome lytic or sclerotic osseous abnormality. CT  ABDOMEN PELVIS FINDINGS Hepatobiliary: No suspicious focal abnormality within the liver parenchyma. There is no evidence for gallstones, gallbladder wall thickening, or pericholecystic fluid. No intrahepatic or extrahepatic biliary dilation. Pancreas: No focal mass lesion. No dilatation of the main duct. No intraparenchymal cyst. No peripancreatic edema. Spleen: No splenomegaly. No focal mass lesion. Adrenals/Urinary Tract: Previously characterized adrenal adenomas are stable, left greater than right. Left adrenal adenoma measures 2.4 cm today, stable since 01/15/2021. Prominent extrarenal pelvis right kidney, unchanged. No suspicious enhancing lesion evident in either kidney. No evidence for hydroureter. The urinary bladder appears normal for the degree of distention. Stomach/Bowel: Stomach is unremarkable. No gastric wall thickening. No evidence of outlet obstruction. Duodenum is normally positioned as is the ligament of Treitz. No small bowel wall thickening. No small bowel dilatation. The terminal ileum is normal. The appendix is not well visualized, but there is no edema or inflammation in the region of the cecum. No gross colonic mass. No colonic wall thickening. Vascular/Lymphatic: There is mild atherosclerotic calcification of the abdominal aorta without aneurysm. There is no gastrohepatic or hepatoduodenal ligament lymphadenopathy. No retroperitoneal or mesenteric lymphadenopathy. No pelvic sidewall lymphadenopathy. Reproductive: The prostate gland and seminal vesicles are unremarkable. Other: No intraperitoneal free fluid. Musculoskeletal: No worrisome lytic or sclerotic osseous abnormality. IMPRESSION: 1. Stable exam. No new or progressive findings. Non opacification of the interlobar pulmonary artery in the right inferior pulmonary vein is again noted. 2. Post treatment fibrosis in the parahilar right lung  is stable in the interval. 3. Stable bilateral adrenal adenomas. 4. Aortic Atherosclerosis  (ICD10-I70.0) and Emphysema (ICD10-J43.9). Electronically Signed   By: Misty Stanley M.D.   On: 03/17/2021 14:25     ASSESSMENT/PLAN:  This is a very pleasant 66 year old Caucasian male initially diagnosed with stage IIIc non-small cell lung cancer, adenocarcinoma.  He was diagnosed in 2020. Molecular studies negative for any actionable mutations.  He was found to have disease recurrence in October 2022. No actionable mutations by guardant 360.   The patient initially completed concurrent chemoradiation with weekly carboplatin for an AUC of 2 and paclitaxel 45 mg per metered squared.  He status post 6 cycles.  He tolerated this well with a partial response.  He then underwent consolidation immunotherapy with Imfinzi 1500 mg IV every 4 weeks.  He status post 13 cycles.   In September 2022, the patient scan showed an increase in the size of the soft tissue nodule in the right middle lobe adjacent to the post radiation changes which is highly suspicious for disease recurrence.  He also has a soft tissue of the right hilum/azygoesophageal recess increased compared to remote prior studies.  The patient a PET scan that showed hypermetabolic nodule in the right lower lobe adjacent to the post radiation treatment site is increased consistent with lung cancer recurrence.  There is also a second hypermetabolic nodule in the right lower lobe above the diaphragm concerning for recurrence and hypermetabolic contralateral lymph node in the high left paratracheal mediastinum also concerning for metastatic adenopathy recurrence.  Dr. Julien Nordmann arranged for the patient to have guardant 360 molecular testing performed which showed no actionable mutations.  Therefore, Dr. Julien Nordmann recommend the patient start palliative systemic chemotherapy with carboplatin for an AUC of 5, Alimta 500 mg per metered square, Keytruda 200 mg IV every 3 weeks. He is status post 5 cycles of treatment. Starting from cycle #5, the patient  started maintenance alimta 500 mg/m2 and Keytruda 200 mg IV every 3 weeks.   Labs were reviewed. Recommend that he proceed with cycle #6 today as scheduled.   We will see him back for follow-up visit in 3 weeks for evaluation before starting cycle #7  For the history of pulmonary embolism, he will continue on Xarelto.  For his dysphagia, may be secondary to his prior radiation. I will refer him to GI to ensure no narrowing of the esophagus or to make sure no areas amenable to dilation or other etiologies.   For his visual changes, he has not seen a eye doctor in over 10 years. Advised him to follow up with them.    The patient was advised to call immediately if he has any concerning symptoms in the interval. The patient voices understanding of current disease status and treatment options and is in agreement with the current care plan. All questions were answered. The patient knows to call the clinic with any problems, questions or concerns. We can certainly see the patient much sooner if necessary          Orders Placed This Encounter  Procedures   Ambulatory referral to Gastroenterology    Referral Priority:   Routine    Referral Type:   Consultation    Referral Reason:   Specialty Services Required    Number of Visits Requested:   1     The total time spent in the appointment was 20-29 minutes.   Jonnie Truxillo L Xana Bradt, [name] 04/14/21

## 2021-04-14 ENCOUNTER — Inpatient Hospital Stay: Payer: No Typology Code available for payment source

## 2021-04-14 ENCOUNTER — Inpatient Hospital Stay (HOSPITAL_BASED_OUTPATIENT_CLINIC_OR_DEPARTMENT_OTHER): Payer: No Typology Code available for payment source | Admitting: Physician Assistant

## 2021-04-14 ENCOUNTER — Inpatient Hospital Stay: Payer: No Typology Code available for payment source | Attending: Internal Medicine

## 2021-04-14 ENCOUNTER — Other Ambulatory Visit: Payer: Self-pay | Admitting: Physician Assistant

## 2021-04-14 ENCOUNTER — Other Ambulatory Visit: Payer: Self-pay

## 2021-04-14 VITALS — BP 115/95 | HR 88 | Temp 97.6°F | Resp 16 | Ht 66.0 in | Wt 189.6 lb

## 2021-04-14 DIAGNOSIS — C3401 Malignant neoplasm of right main bronchus: Secondary | ICD-10-CM | POA: Diagnosis not present

## 2021-04-14 DIAGNOSIS — C3490 Malignant neoplasm of unspecified part of unspecified bronchus or lung: Secondary | ICD-10-CM

## 2021-04-14 DIAGNOSIS — Z5112 Encounter for antineoplastic immunotherapy: Secondary | ICD-10-CM | POA: Insufficient documentation

## 2021-04-14 DIAGNOSIS — Z5111 Encounter for antineoplastic chemotherapy: Secondary | ICD-10-CM | POA: Insufficient documentation

## 2021-04-14 DIAGNOSIS — E538 Deficiency of other specified B group vitamins: Secondary | ICD-10-CM | POA: Diagnosis not present

## 2021-04-14 DIAGNOSIS — Z79899 Other long term (current) drug therapy: Secondary | ICD-10-CM | POA: Diagnosis not present

## 2021-04-14 DIAGNOSIS — R1310 Dysphagia, unspecified: Secondary | ICD-10-CM | POA: Diagnosis not present

## 2021-04-14 DIAGNOSIS — C3491 Malignant neoplasm of unspecified part of right bronchus or lung: Secondary | ICD-10-CM

## 2021-04-14 LAB — CMP (CANCER CENTER ONLY)
ALT: 29 U/L (ref 0–44)
AST: 29 U/L (ref 15–41)
Albumin: 4.1 g/dL (ref 3.5–5.0)
Alkaline Phosphatase: 94 U/L (ref 38–126)
Anion gap: 8 (ref 5–15)
BUN: 16 mg/dL (ref 8–23)
CO2: 26 mmol/L (ref 22–32)
Calcium: 9.8 mg/dL (ref 8.9–10.3)
Chloride: 104 mmol/L (ref 98–111)
Creatinine: 0.96 mg/dL (ref 0.61–1.24)
GFR, Estimated: >60 mL/min (ref >60)
Glucose, Bld: 99 mg/dL (ref 70–99)
Potassium: 4.2 mmol/L (ref 3.5–5.1)
Sodium: 138 mmol/L (ref 135–145)
Total Bilirubin: 0.4 mg/dL (ref 0.3–1.2)
Total Protein: 7.6 g/dL (ref 6.5–8.1)

## 2021-04-14 LAB — CBC WITH DIFFERENTIAL (CANCER CENTER ONLY)
Abs Immature Granulocytes: 0.01 10*3/uL (ref 0.00–0.07)
Basophils Absolute: 0.1 10*3/uL (ref 0.0–0.1)
Basophils Relative: 1 %
Eosinophils Absolute: 0.1 10*3/uL (ref 0.0–0.5)
Eosinophils Relative: 3 %
HCT: 45.4 % (ref 39.0–52.0)
Hemoglobin: 15.4 g/dL (ref 13.0–17.0)
Immature Granulocytes: 0 %
Lymphocytes Relative: 18 %
Lymphs Abs: 0.8 10*3/uL (ref 0.7–4.0)
MCH: 33.2 pg (ref 26.0–34.0)
MCHC: 33.9 g/dL (ref 30.0–36.0)
MCV: 97.8 fL (ref 80.0–100.0)
Monocytes Absolute: 0.8 10*3/uL (ref 0.1–1.0)
Monocytes Relative: 16 %
Neutro Abs: 2.8 10*3/uL (ref 1.7–7.7)
Neutrophils Relative %: 62 %
Platelet Count: 250 10*3/uL (ref 150–400)
RBC: 4.64 MIL/uL (ref 4.22–5.81)
RDW: 13.2 % (ref 11.5–15.5)
WBC Count: 4.6 10*3/uL (ref 4.0–10.5)
nRBC: 0 % (ref 0.0–0.2)

## 2021-04-14 LAB — TSH: TSH: 2.349 u[IU]/mL (ref 0.320–4.118)

## 2021-04-14 MED ORDER — PROCHLORPERAZINE MALEATE 10 MG PO TABS
10.0000 mg | ORAL_TABLET | Freq: Once | ORAL | Status: AC
  Administered 2021-04-14: 10 mg via ORAL
  Filled 2021-04-14: qty 1

## 2021-04-14 MED ORDER — SODIUM CHLORIDE 0.9 % IV SOLN
500.0000 mg/m2 | Freq: Once | INTRAVENOUS | Status: AC
  Administered 2021-04-14: 1000 mg via INTRAVENOUS
  Filled 2021-04-14: qty 40

## 2021-04-14 MED ORDER — SODIUM CHLORIDE 0.9 % IV SOLN: Freq: Once | INTRAVENOUS | Status: AC

## 2021-04-14 MED ORDER — CYANOCOBALAMIN 1000 MCG/ML IJ SOLN
1000.0000 ug | Freq: Once | INTRAMUSCULAR | Status: AC
  Administered 2021-04-14: 1000 ug via INTRAMUSCULAR
  Filled 2021-04-14: qty 1

## 2021-04-14 MED ORDER — SODIUM CHLORIDE 0.9 % IV SOLN
200.0000 mg | Freq: Once | INTRAVENOUS | Status: AC
  Administered 2021-04-14: 200 mg via INTRAVENOUS
  Filled 2021-04-14: qty 200

## 2021-04-14 NOTE — Patient Instructions (Signed)
[facility] MEDICAL ONCOLOGY  Discharge Instructions: Thank you for choosing [facility] to provide your oncology and hematology care.   If you have a lab appointment with the Cancer Center, please go directly to the Cancer Center and check in at the registration area.   Wear comfortable clothing and clothing appropriate for easy access to any Portacath or PICC line.   We strive to give you quality time with your provider. You may need to reschedule your appointment if you arrive late (15 or more minutes).  Arriving late affects you and other patients whose appointments are after yours.  Also, if you miss three or more appointments without notifying the office, you may be dismissed from the clinic at the provider's discretion.      For prescription refill requests, have your pharmacy contact our office and allow 72 hours for refills to be completed.    Today you received the following chemotherapy and/or immunotherapy agents: Keytruda/Alimta.      To help prevent nausea and vomiting after your treatment, we encourage you to take your nausea medication as directed.  BELOW ARE SYMPTOMS THAT SHOULD BE REPORTED IMMEDIATELY: *FEVER GREATER THAN 100.4 F (38 C) OR HIGHER *CHILLS OR SWEATING *NAUSEA AND VOMITING THAT IS NOT CONTROLLED WITH YOUR NAUSEA MEDICATION *UNUSUAL SHORTNESS OF BREATH *UNUSUAL BRUISING OR BLEEDING *URINARY PROBLEMS (pain or burning when urinating, or frequent urination) *BOWEL PROBLEMS (unusual diarrhea, constipation, pain near the anus) TENDERNESS IN MOUTH AND THROAT WITH OR WITHOUT PRESENCE OF ULCERS (sore throat, sores in mouth, or a toothache) UNUSUAL RASH, SWELLING OR PAIN  UNUSUAL VAGINAL DISCHARGE OR ITCHING   Items with * indicate a potential emergency and should be followed up as soon as possible or go to the Emergency Department if any problems should occur.  Please show the CHEMOTHERAPY ALERT CARD or IMMUNOTHERAPY ALERT CARD at  check-in to the Emergency Department and triage nurse.  Should you have questions after your visit or need to cancel or reschedule your appointment, please contact [facility]  Dept: [phone]  and follow the prompts.  Office hours are 8:00 a.m. to 4:30 p.m. Monday - Friday. Please note that voicemails left after 4:00 p.m. may not be returned until the following business day.  We are closed weekends and major holidays. You have access to a nurse at all times for urgent questions. Please call the main number to the clinic Dept: [phone] and follow the prompts.   For any non-urgent questions, you may also contact your provider using MyChart. We now offer e-Visits for anyone 18 and older to request care online for non-urgent symptoms. For details visit [email or web address].   Also download the MyChart app! Go to the app store, search "MyChart", open the app, select [facility], and log in with your MyChart username and password.  Due to Covid, a mask is required upon entering the hospital/clinic. If you do not have a mask, one will be given to you upon arrival. For doctor visits, patients may have 1 support person aged 18 or older with them. For treatment visits, patients cannot have anyone with them due to current Covid guidelines and our immunocompromised population.   

## 2021-05-05 ENCOUNTER — Other Ambulatory Visit: Payer: Self-pay

## 2021-05-05 ENCOUNTER — Inpatient Hospital Stay (HOSPITAL_BASED_OUTPATIENT_CLINIC_OR_DEPARTMENT_OTHER): Payer: No Typology Code available for payment source | Admitting: Internal Medicine

## 2021-05-05 ENCOUNTER — Inpatient Hospital Stay: Payer: No Typology Code available for payment source

## 2021-05-05 ENCOUNTER — Inpatient Hospital Stay: Payer: No Typology Code available for payment source | Attending: Internal Medicine

## 2021-05-05 VITALS — BP 111/83 | HR 94 | Temp 97.5°F | Resp 18 | Ht 66.0 in | Wt 184.6 lb

## 2021-05-05 DIAGNOSIS — Z79899 Other long term (current) drug therapy: Secondary | ICD-10-CM | POA: Insufficient documentation

## 2021-05-05 DIAGNOSIS — Z923 Personal history of irradiation: Secondary | ICD-10-CM | POA: Diagnosis not present

## 2021-05-05 DIAGNOSIS — Z7901 Long term (current) use of anticoagulants: Secondary | ICD-10-CM | POA: Insufficient documentation

## 2021-05-05 DIAGNOSIS — E785 Hyperlipidemia, unspecified: Secondary | ICD-10-CM | POA: Diagnosis not present

## 2021-05-05 DIAGNOSIS — Z5112 Encounter for antineoplastic immunotherapy: Secondary | ICD-10-CM

## 2021-05-05 DIAGNOSIS — C3411 Malignant neoplasm of upper lobe, right bronchus or lung: Secondary | ICD-10-CM | POA: Insufficient documentation

## 2021-05-05 DIAGNOSIS — Z86711 Personal history of pulmonary embolism: Secondary | ICD-10-CM | POA: Diagnosis not present

## 2021-05-05 DIAGNOSIS — C3491 Malignant neoplasm of unspecified part of right bronchus or lung: Secondary | ICD-10-CM

## 2021-05-05 DIAGNOSIS — C3490 Malignant neoplasm of unspecified part of unspecified bronchus or lung: Secondary | ICD-10-CM

## 2021-05-05 DIAGNOSIS — F17210 Nicotine dependence, cigarettes, uncomplicated: Secondary | ICD-10-CM | POA: Diagnosis not present

## 2021-05-05 DIAGNOSIS — R1310 Dysphagia, unspecified: Secondary | ICD-10-CM | POA: Diagnosis not present

## 2021-05-05 DIAGNOSIS — Z5111 Encounter for antineoplastic chemotherapy: Secondary | ICD-10-CM | POA: Diagnosis present

## 2021-05-05 LAB — CBC WITH DIFFERENTIAL (CANCER CENTER ONLY)
Abs Immature Granulocytes: 0.02 10*3/uL (ref 0.00–0.07)
Basophils Absolute: 0.1 10*3/uL (ref 0.0–0.1)
Basophils Relative: 1 %
Eosinophils Absolute: 0.1 10*3/uL (ref 0.0–0.5)
Eosinophils Relative: 3 %
HCT: 42.9 % (ref 39.0–52.0)
Hemoglobin: 14.6 g/dL (ref 13.0–17.0)
Immature Granulocytes: 0 %
Lymphocytes Relative: 19 %
Lymphs Abs: 0.9 10*3/uL (ref 0.7–4.0)
MCH: 32.9 pg (ref 26.0–34.0)
MCHC: 34 g/dL (ref 30.0–36.0)
MCV: 96.6 fL (ref 80.0–100.0)
Monocytes Absolute: 0.9 10*3/uL (ref 0.1–1.0)
Monocytes Relative: 19 %
Neutro Abs: 2.8 10*3/uL (ref 1.7–7.7)
Neutrophils Relative %: 58 %
Platelet Count: 293 10*3/uL (ref 150–400)
RBC: 4.44 MIL/uL (ref 4.22–5.81)
RDW: 12.4 % (ref 11.5–15.5)
WBC Count: 4.8 10*3/uL (ref 4.0–10.5)
nRBC: 0 % (ref 0.0–0.2)

## 2021-05-05 LAB — CMP (CANCER CENTER ONLY)
ALT: 24 U/L (ref 0–44)
AST: 26 U/L (ref 15–41)
Albumin: 3.9 g/dL (ref 3.5–5.0)
Alkaline Phosphatase: 101 U/L (ref 38–126)
Anion gap: 8 (ref 5–15)
BUN: 17 mg/dL (ref 8–23)
CO2: 24 mmol/L (ref 22–32)
Calcium: 9.4 mg/dL (ref 8.9–10.3)
Chloride: 105 mmol/L (ref 98–111)
Creatinine: 0.96 mg/dL (ref 0.61–1.24)
GFR, Estimated: >60 mL/min (ref >60)
Glucose, Bld: 100 mg/dL — ABNORMAL HIGH (ref 70–99)
Potassium: 4.4 mmol/L (ref 3.5–5.1)
Sodium: 137 mmol/L (ref 135–145)
Total Bilirubin: 0.6 mg/dL (ref 0.3–1.2)
Total Protein: 7.8 g/dL (ref 6.5–8.1)

## 2021-05-05 LAB — TSH: TSH: 1.931 u[IU]/mL (ref 0.320–4.118)

## 2021-05-05 MED ORDER — PROCHLORPERAZINE MALEATE 10 MG PO TABS
10.0000 mg | ORAL_TABLET | Freq: Once | ORAL | Status: AC
  Administered 2021-05-05: 10 mg via ORAL
  Filled 2021-05-05: qty 1

## 2021-05-05 MED ORDER — SODIUM CHLORIDE 0.9 % IV SOLN
200.0000 mg | Freq: Once | INTRAVENOUS | Status: AC
  Administered 2021-05-05: 200 mg via INTRAVENOUS
  Filled 2021-05-05: qty 200

## 2021-05-05 MED ORDER — SODIUM CHLORIDE 0.9 % IV SOLN
500.0000 mg/m2 | Freq: Once | INTRAVENOUS | Status: AC
  Administered 2021-05-05: 1000 mg via INTRAVENOUS
  Filled 2021-05-05: qty 40

## 2021-05-05 MED ORDER — SODIUM CHLORIDE 0.9 % IV SOLN: Freq: Once | INTRAVENOUS | Status: AC

## 2021-05-05 NOTE — Patient Instructions (Signed)
[facility]  Discharge Instructions: Thank you for choosing [facility] to provide your oncology and hematology care.   If you have a lab appointment with the Cancer Center, please go directly to the Cancer Center and check in at the registration area.   Wear comfortable clothing and clothing appropriate for easy access to any Portacath or PICC line.   We strive to give you quality time with your provider. You may need to reschedule your appointment if you arrive late (15 or more minutes).  Arriving late affects you and other patients whose appointments are after yours.  Also, if you miss three or more appointments without notifying the office, you may be dismissed from the clinic at the provider's discretion.      For prescription refill requests, have your pharmacy contact our office and allow 72 hours for refills to be completed.    Today you received the following chemotherapy and/or immunotherapy agents: Keytruda/Alimta.      To help prevent nausea and vomiting after your treatment, we encourage you to take your nausea medication as directed.  BELOW ARE SYMPTOMS THAT SHOULD BE REPORTED IMMEDIATELY: *FEVER GREATER THAN 100.4 F (38 C) OR HIGHER *CHILLS OR SWEATING *NAUSEA AND VOMITING THAT IS NOT CONTROLLED WITH YOUR NAUSEA MEDICATION *UNUSUAL SHORTNESS OF BREATH *UNUSUAL BRUISING OR BLEEDING *URINARY PROBLEMS (pain or burning when urinating, or frequent urination) *BOWEL PROBLEMS (unusual diarrhea, constipation, pain near the anus) TENDERNESS IN MOUTH AND THROAT WITH OR WITHOUT PRESENCE OF ULCERS (sore throat, sores in mouth, or a toothache) UNUSUAL RASH, SWELLING OR PAIN  UNUSUAL VAGINAL DISCHARGE OR ITCHING   Items with * indicate a potential emergency and should be followed up as soon as possible or go to the Emergency Department if any problems should occur.  Please show the CHEMOTHERAPY ALERT CARD or IMMUNOTHERAPY ALERT CARD at  check-in to the Emergency Department and triage nurse.  Should you have questions after your visit or need to cancel or reschedule your appointment, please contact [facility]  Dept: [phone]  and follow the prompts.  Office hours are 8:00 a.m. to 4:30 p.m. Monday - Friday. Please note that voicemails left after 4:00 p.m. may not be returned until the following business day.  We are closed weekends and major holidays. You have access to a nurse at all times for urgent questions. Please call the main number to the clinic Dept: [phone] and follow the prompts.   For any non-urgent questions, you may also contact your provider using MyChart. We now offer e-Visits for anyone 18 and older to request care online for non-urgent symptoms. For details visit [email or web address].   Also download the MyChart app! Go to the app store, search "MyChart", open the app, select [facility], and log in with your MyChart username and password.  Due to Covid, a mask is required upon entering the hospital/clinic. If you do not have a mask, one will be given to you upon arrival. For doctor visits, patients may have 1 support person aged 18 or older with them. For treatment visits, patients cannot have anyone with them due to current Covid guidelines and our immunocompromised population.   

## 2021-05-05 NOTE — Progress Notes (Signed)
?    [facility] ?Telephone:([phone]) [phone]   [phone] ? ?OFFICE PROGRESS NOTE ? ?[name], MD ?[address] ?[place] [address] ? ?DIAGNOSIS:  ?1) recurrent non-small cell lung cancer initially diagnosed as stage IIIc (T4, N3, M0) non-small cell lung cancer, adenocarcinoma presented with large right hilar mass in addition to bilateral hilar and mediastinal lymphadenopathy diagnosed in December 2020.  The patient has evidence for disease recurrence in October 2022. 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  Started on Xarelto on March 17, 2020 and currently 20 mg p.o. daily. ?  ?PRIOR THERAPY:  ?1) Weekly concurrent chemoradiation with carboplatin for an AUC of 2 and paclitaxel 45 mg/m2. First dose starting 01/30/2019.   Status post 6 cycles. ?2) Consolidation immunotherapy with Imfinzi 1500 mg IV every 4 weeks. First dose expected on 04/19/2019.  Status post 13 cycles. ?3) disease recurrence in October 2022. ?  ?CURRENT THERAPY: Systemic chemotherapy with carboplatin for AUC of 5, Alimta 500 Mg/M2 and Keytruda 200 Mg IV every 3 weeks.  First dose January 01, 2021.  Status post 6 cycles.  He had hypersensitivity reaction to carboplatin and this was discontinued after cycle #2. ? ?INTERVAL HISTORY: ?Philip [name] 66 y.o. male returns to the clinic today for follow-up visit.  The patient is feeling fine today with no concerning complaints.  He denied having any current chest pain, shortness of breath, cough or hemoptysis.  He has no nausea, vomiting, diarrhea or constipation.  He has no headache or visual changes.  He has no recent weight loss or night sweats.  The patient continues to tolerate his maintenance treatment with Alimta and Keytruda fairly well.  He is here for evaluation before starting cycle #7. ? ?MEDICAL HISTORY: ?Past Medical History:  ?Diagnosis  Date  ? Hyperlipidemia   ? lung ca dx'd 12/2018  ? Pneumonia   ? Pulmonary embolus ([place])   ? Tobacco abuse   ? ? ?ALLERGIES:  is allergic to carboplatin, clonazepam, and norco [hydrocodone-acetaminophen]. ? ?MEDICATIONS:  ?Current Outpatient Medications  ?Medication Sig Dispense Refill  ? albuterol (VENTOLIN HFA) 108 (90 Base) MCG/ACT inhaler Inhale 2 puffs into the lungs every 4 (four) hours as needed for wheezing or shortness of breath. 18 g 6  ? atorvastatin (LIPITOR) 20 MG tablet Take 1 tablet by mouth daily.    ? dextromethorphan-guaiFENesin (MUCINEX DM) 30-600 MG 12hr tablet Take 1 tablet by mouth 2 (two) times daily as needed for cough. 40 tablet 0  ? famotidine (PEPCID) 20 MG tablet TAKE 1 TABLET BY MOUTH TWICE A DAY 180 tablet 1  ? fexofenadine (ALLEGRA) 180 MG tablet Take 180 mg by mouth daily.    ? folic acid (FOLVITE) 1 MG tablet TAKE 1 TABLET BY MOUTH EVERY DAY 90 tablet 1  ? HYDROcodone-acetaminophen (NORCO) 5-325 MG tablet Take 1 tablet by mouth every 6 (six) hours as needed for moderate pain. (Patient not taking: Reported on 04/14/2021) 5 tablet 0  ? naphazoline-pheniramine (VISINE) 0.025-0.3 % ophthalmic solution 1 drop into both eyes as needed for red eye    ? prochlorperazine (COMPAZINE) 10 MG tablet Take 1 tablet (10 mg total) by mouth every 6 (six) hours as needed for nausea or vomiting. (Patient not taking: Reported on 04/14/2021) 30 tablet 0  ? rivaroxaban (XARELTO) 20 MG TABS tablet Take 1 tablet (20 mg total) by mouth daily with  supper. 30 tablet 11  ? Tiotropium Bromide-Olodaterol (STIOLTO RESPIMAT) 2.5-2.5 MCG/ACT AERS Inhale 2 puffs into the lungs daily. 1 each 11  ? ?No current facility-administered medications for this visit.  ? ? ?SURGICAL HISTORY:  ?Past Surgical History:  ?Procedure Laterality Date  ? COLONOSCOPY    ? HAND SURGERY    ? VIDEO BRONCHOSCOPY WITH ENDOBRONCHIAL ULTRASOUND N/A 12/22/2018  ? Procedure: VIDEO BRONCHOSCOPY WITH ENDOBRONCHIAL ULTRASOUND;  Surgeon: Garner Nash, DO;  Location: [facility];  Service: Thoracic;  Laterality: N/A;  ? VIDEO BRONCHOSCOPY WITH ENDOBRONCHIAL ULTRASOUND N/A 01/09/2019  ? Procedure: VIDEO BRONCHOSCOPY WITH ENDOBRONCHIAL ULTRASOUND WITH FLUORO;  Surgeon: Garner Nash, DO;  Location: [facility];  Service: Thoracic;  Laterality: N/A;  ? VIDEO BRONCHOSCOPY WITH RADIAL ENDOBRONCHIAL ULTRASOUND N/A 12/22/2018  ? Procedure: RADIAL ENDOBRONCHIAL ULTRASOUND;  Surgeon: Garner Nash, DO;  Location: [facility];  Service: Thoracic;  Laterality: N/A;  ? ? ?REVIEW OF SYSTEMS:  A comprehensive review of systems was negative.  ? ?PHYSICAL EXAMINATION: General appearance: alert, cooperative, and no distress ?Head: Normocephalic, without obvious abnormality, atraumatic ?Neck: no adenopathy, no JVD, supple, symmetrical, trachea midline, and thyroid not enlarged, symmetric, no tenderness/mass/nodules ?Lymph nodes: Cervical, supraclavicular, and axillary nodes normal. ?Resp: clear to auscultation bilaterally ?Back: symmetric, no curvature. ROM normal. No CVA tenderness. ?Cardio: regular rate and rhythm, S1, S2 normal, no murmur, click, rub or gallop ?GI: soft, non-tender; bowel sounds normal; no masses,  no organomegaly ?Extremities: extremities normal, atraumatic, no cyanosis or edema ? ?ECOG PERFORMANCE STATUS: 1 - Symptomatic but completely ambulatory ? ?Blood pressure 111/83, pulse 94, temperature (!) 97.5 ?F (36.4 ?C), temperature source Tympanic, resp. rate 18, height 5\' 6"  (1.676 m), weight 184 lb 9 oz (83.7 kg), SpO2 97 %. ? ?LABORATORY DATA: ?Lab Results  ?Component Value Date  ? WBC 4.8 05/05/2021  ? HGB 14.6 05/05/2021  ? HCT 42.9 05/05/2021  ? MCV 96.6 05/05/2021  ? PLT 293 05/05/2021  ? ? ?  Chemistry   ?   ?Component Value Date/Time  ? NA 138 04/14/2021 0838  ? K 4.2 04/14/2021 0838  ? CL 104 04/14/2021 0838  ? CO2 26 04/14/2021 0838  ? BUN 16 04/14/2021 0838  ? CREATININE 0.96 04/14/2021 0838  ?    ?Component Value Date/Time  ? CALCIUM 9.8 04/14/2021 0838  ?  ALKPHOS 94 04/14/2021 0838  ? AST 29 04/14/2021 0838  ? ALT 29 04/14/2021 0838  ? BILITOT 0.4 04/14/2021 0838  ?  ? ? ? ?RADIOGRAPHIC STUDIES: ?No results found. ? ? ?ASSESSMENT AND PLAN: This is a very pleasant 66 years old white male with metastatic non-small cell lung cancer that was initially diagnosed as stage IIIc non-small cell lung cancer, adenocarcinoma diagnosed in December 2020 ?The patient completed a course of concurrent chemoradiation with weekly carboplatin and paclitaxel status post 6 cycles and he tolerated his treatment well and has partial response. ?He underwent consolidation treatment with immunotherapy with Imfinzi 1500 mg IV every 4 weeks status post 13 cycles. ?He was on observation and feeling fine with no concerning complaints except for mild cough as well as the left shoulder pain. ?His scan showed increase in the size of the soft tissue nodule in the right middle lobe adjacent to the postradiation changes highly suspicious for recurrent disease.  There was also soft tissue of the right hilum/as ago esophageal recess unchanged at but increased compared to remote prior studies. ?The patient had a PET scan that showed hypermetabolic nodule in  the right lower lobe adjacent to the post radiation treatment site increased in size and consistent with lung cancer recurrence.  There was second hypermetabolic nodule in the right lower lobe above the diaphragm concerning for recurrence and hypermetabolic contralateral lymph node in the high left paratracheal mediastinum also concerning for metastatic adenopathy recurrence. ?He started systemic chemotherapy with carboplatin for AUC of 5, Alimta 500 Mg/M2 and Keytruda 200 Mg IV every 3 weeks on January 01, 2021.   Carboplatin was discontinued with cycle #2 secondary to hypersensitivity reaction.  The patient is status post 6 cycles. ?The patient continues to tolerate this treatment well with no concerning adverse effects. ?I recommended for him to  proceed with cycle #7 today as planned. ?I will see him back for follow-up visit in 3 weeks for evaluation before starting cycle #8. ?For the history of pulmonary embolism, he will continue on Xarelto. ?The

## 2021-05-06 ENCOUNTER — Encounter: Payer: Self-pay | Admitting: Internal Medicine

## 2021-05-06 ENCOUNTER — Encounter: Payer: Self-pay | Admitting: Physician Assistant

## 2021-05-19 ENCOUNTER — Encounter: Payer: Self-pay | Admitting: Internal Medicine

## 2021-05-19 ENCOUNTER — Telehealth: Payer: Self-pay | Admitting: Medical Oncology

## 2021-05-19 ENCOUNTER — Encounter: Payer: Self-pay | Admitting: Physician Assistant

## 2021-05-19 NOTE — Telephone Encounter (Signed)
Faxed recent office note and future appts. ?

## 2021-05-23 NOTE — Progress Notes (Signed)
[facility] ?OFFICE PROGRESS NOTE ? ?[name], MD ?[address] ?[place] [address] ? ?DIAGNOSIS: 1) recurrent non-small cell lung cancer initially diagnosed as stage IIIc (T4, N3, M0) non-small cell lung cancer, adenocarcinoma presented with large right hilar mass in addition to bilateral hilar and mediastinal lymphadenopathy diagnosed in December 2020.  The patient has evidence for disease recurrence in October 2022. 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  Started on Xarelto on March 17, 2020 and currently 20 mg p.o. daily. ?  ?No actionable mutations by [facility] ? ?PRIOR THERAPY: ?1) Weekly concurrent chemoradiation with carboplatin for an AUC of 2 and paclitaxel 45 mg/m2. First dose starting 01/30/2019.   Status post 6 cycles. ?2) Consolidation immunotherapy with Imfinzi 1500 mg IV every 4 weeks. First dose expected on 04/19/2019.  Status post 13 cycles. ?3) disease recurrence in October 2022. ? ?CURRENT THERAPY:  Systemic chemotherapy with carboplatin for AUC of 5, Alimta 500 Mg/M2 and Keytruda 200 Mg IV every 3 weeks.  First dose January 01, 2021. Status post 7 cycles. He started maintenance alimta and [place] starting from cycle #5.  ?  ?INTERVAL HISTORY: ?Philip [name] 66 y.o. male returns to the clinic today for a follow-up visit.  The patient is feeling fairly well today without any concerning complaints.  In March 2023, the patient endorsed to me that he had been having dysphagia/odynophagia for a few months.  He is reporting that feels like food and pills get stuck in his throat.  I referred him to gastroenterology but they were unable to reach the patient and they have closed the referral.  The patient is still interested in this referral. I have given him the number.  Otherwise, he denies any new concerning complaints today. He is going on vacation  for a few weeks and is requesting adjustment in his schedule.  Denies any fever or chills.  He lost a few pounds but states this was intentional as he has been eating less and exercising more.  He has some intermittent night sweats but none recently.  He denies any chest pain or hemoptysis.  He reports his cough has improved recently and he states the cough is "not bad".  Denies any significant dyspnea except with certain activities.  Denies any nausea, vomiting, diarrhea, or constipation.  Denies any headache.  The patient reports he is overdue for an eye exam and has not had his eyes checked in over a decade.  He denies any rashes or skin changes.  He is here today for evaluation and repeat blood work before starting cycle #8. ? ? ? ? ?MEDICAL HISTORY: ?Past Medical History:  ?Diagnosis Date  ? Hyperlipidemia   ? lung ca dx'd 12/2018  ? Pneumonia   ? Pulmonary embolus ([place])   ? Tobacco abuse   ? ? ?ALLERGIES:  is allergic to carboplatin, clonazepam, and norco [hydrocodone-acetaminophen]. ? ?MEDICATIONS:  ?Current Outpatient Medications  ?Medication Sig Dispense Refill  ? albuterol (VENTOLIN HFA) 108 (90 Base) MCG/ACT inhaler Inhale 2 puffs into the lungs every 4 (four) hours as needed for wheezing or shortness of breath. 18 g 6  ? atorvastatin (LIPITOR) 20 MG tablet Take 1 tablet by mouth daily.    ? dextromethorphan-guaiFENesin (MUCINEX DM) 30-600 MG 12hr tablet Take 1 tablet by mouth 2 (two) times daily as needed for cough. 40 tablet 0  ? famotidine (  PEPCID) 20 MG tablet TAKE 1 TABLET BY MOUTH TWICE A DAY 180 tablet 1  ? fexofenadine (ALLEGRA) 180 MG tablet Take 180 mg by mouth daily.    ? folic acid (FOLVITE) 1 MG tablet TAKE 1 TABLET BY MOUTH EVERY DAY 90 tablet 1  ? HYDROcodone-acetaminophen (NORCO) 5-325 MG tablet Take 1 tablet by mouth every 6 (six) hours as needed for moderate pain. 5 tablet 0  ? naphazoline-pheniramine (VISINE) 0.025-0.3 % ophthalmic solution 1 drop into both eyes as needed for red eye     ? prochlorperazine (COMPAZINE) 10 MG tablet Take 1 tablet (10 mg total) by mouth every 6 (six) hours as needed for nausea or vomiting. (Patient not taking: Reported on 04/14/2021) 30 tablet 0  ? rivaroxaban (XARELTO) 20 MG TABS tablet Take 1 tablet (20 mg total) by mouth daily with supper. 30 tablet 11  ? Tiotropium Bromide-Olodaterol (STIOLTO RESPIMAT) 2.5-2.5 MCG/ACT AERS Inhale 2 puffs into the lungs daily. 1 each 11  ? ?No current facility-administered medications for this visit.  ? ?Facility-Administered Medications Ordered in Other Visits  ?Medication Dose Route Frequency Provider Last Rate Last Admin  ? pembrolizumab (KEYTRUDA) 200 mg in sodium chloride 0.9 % 50 mL chemo infusion  200 mg Intravenous Once [name], MD      ? PEMEtrexed (ALIMTA) 1,000 mg in sodium chloride 0.9 % 100 mL chemo infusion  500 mg/m2 (Treatment Plan Recorded) Intravenous Once [name], MD      ? prochlorperazine (COMPAZINE) tablet 10 mg  10 mg Oral Once [name], MD      ? ? ?SURGICAL HISTORY:  ?Past Surgical History:  ?Procedure Laterality Date  ? COLONOSCOPY    ? HAND SURGERY    ? VIDEO BRONCHOSCOPY WITH ENDOBRONCHIAL ULTRASOUND N/A 12/22/2018  ? Procedure: VIDEO BRONCHOSCOPY WITH ENDOBRONCHIAL ULTRASOUND;  Surgeon: Garner Nash, DO;  Location: [facility];  Service: Thoracic;  Laterality: N/A;  ? VIDEO BRONCHOSCOPY WITH ENDOBRONCHIAL ULTRASOUND N/A 01/09/2019  ? Procedure: VIDEO BRONCHOSCOPY WITH ENDOBRONCHIAL ULTRASOUND WITH FLUORO;  Surgeon: Garner Nash, DO;  Location: [facility];  Service: Thoracic;  Laterality: N/A;  ? VIDEO BRONCHOSCOPY WITH RADIAL ENDOBRONCHIAL ULTRASOUND N/A 12/22/2018  ? Procedure: RADIAL ENDOBRONCHIAL ULTRASOUND;  Surgeon: Garner Nash, DO;  Location: [facility];  Service: Thoracic;  Laterality: N/A;  ? ? ?REVIEW OF SYSTEMS:   ?Constitutional: Positive for fatigue. Negative for appetite change, chills, fever and unexpected weight change.  ?HENT: Positive for dysphagia. Negative for  mouth sores, nosebleeds, sore throat and trouble swallowing.   ?Eyes: Negative for eye problems and icterus.  ?Respiratory: Positive for improved intermittent cough and stable dyspnea on exertion. Negative for hemoptysis and wheezing.   ?Cardiovascular: Negative for chest pain and leg swelling.  ?Gastrointestinal: Negative for abdominal pain, constipation, diarrhea, nausea and vomiting.  ?Genitourinary: Negative for bladder incontinence, difficulty urinating, dysuria, frequency and hematuria.   ?Musculoskeletal: Negative for back pain, gait problem, neck pain and neck stiffness.  ?Skin: Negative for itching and rash.  ?Neurological: Negative for dizziness, extremity weakness, gait problem, headaches, light-headedness and seizures.  ?Hematological: Negative for adenopathy. Does not bruise/bleed easily.  ?Psychiatric/Behavioral: Negative for confusion, depression and sleep disturbance. The patient is not nervous/anxious.     ? ? ?PHYSICAL EXAMINATION:  ?Blood pressure 106/71, pulse 97, temperature 98.1 ?F (36.7 ?C), temperature source Tympanic, weight 181 lb 3.2 oz (82.2 kg), SpO2 94 %. ? ?ECOG PERFORMANCE STATUS: 1 ? ?Physical Exam  ?Constitutional: Oriented to person, place, and time and well-developed, well-nourished,  and in no distress.  ?HENT:  ?Head: Normocephalic and atraumatic.  ?Mouth/Throat: Oropharynx is clear and moist. No oropharyngeal exudate.  ?Eyes: Conjunctivae are normal. Right eye exhibits no discharge. Left eye exhibits no discharge. No scleral icterus.  ?Neck: Normal range of motion. Neck supple.  ?Cardiovascular: Normal rate, regular rhythm, normal heart sounds and intact distal pulses.   ?Pulmonary/Chest: Effort normal and breath sounds normal. No respiratory distress. No wheezes. No rales.  ?Abdominal: Soft. Bowel sounds are normal. Exhibits no distension and no mass. There is no tenderness.  ?Musculoskeletal: Normal range of motion. Exhibits no edema.  ?Lymphadenopathy:  ?  No cervical  adenopathy.  ?Neurological: Alert and oriented to person, place, and time. Exhibits normal muscle tone. Gait normal. Coordination normal.  ?Skin: Skin is warm and dry. No rash noted. Not diaphoretic. No erythema

## 2021-05-26 ENCOUNTER — Inpatient Hospital Stay: Payer: No Typology Code available for payment source

## 2021-05-26 ENCOUNTER — Inpatient Hospital Stay (HOSPITAL_BASED_OUTPATIENT_CLINIC_OR_DEPARTMENT_OTHER): Payer: No Typology Code available for payment source | Admitting: Physician Assistant

## 2021-05-26 ENCOUNTER — Other Ambulatory Visit: Payer: Self-pay

## 2021-05-26 VITALS — BP 106/71 | HR 97 | Temp 98.1°F | Wt 181.2 lb

## 2021-05-26 VITALS — Resp 17

## 2021-05-26 DIAGNOSIS — C3491 Malignant neoplasm of unspecified part of right bronchus or lung: Secondary | ICD-10-CM

## 2021-05-26 DIAGNOSIS — Z5112 Encounter for antineoplastic immunotherapy: Secondary | ICD-10-CM | POA: Diagnosis not present

## 2021-05-26 DIAGNOSIS — C3490 Malignant neoplasm of unspecified part of unspecified bronchus or lung: Secondary | ICD-10-CM

## 2021-05-26 DIAGNOSIS — Z5111 Encounter for antineoplastic chemotherapy: Secondary | ICD-10-CM

## 2021-05-26 LAB — CBC WITH DIFFERENTIAL (CANCER CENTER ONLY)
Abs Immature Granulocytes: 0.02 10*3/uL (ref 0.00–0.07)
Basophils Absolute: 0.1 10*3/uL (ref 0.0–0.1)
Basophils Relative: 1 %
Eosinophils Absolute: 0.2 10*3/uL (ref 0.0–0.5)
Eosinophils Relative: 3 %
HCT: 40.8 % (ref 39.0–52.0)
Hemoglobin: 13.9 g/dL (ref 13.0–17.0)
Immature Granulocytes: 0 %
Lymphocytes Relative: 16 %
Lymphs Abs: 0.8 10*3/uL (ref 0.7–4.0)
MCH: 32.1 pg (ref 26.0–34.0)
MCHC: 34.1 g/dL (ref 30.0–36.0)
MCV: 94.2 fL (ref 80.0–100.0)
Monocytes Absolute: 0.8 10*3/uL (ref 0.1–1.0)
Monocytes Relative: 14 %
Neutro Abs: 3.5 10*3/uL (ref 1.7–7.7)
Neutrophils Relative %: 66 %
Platelet Count: 319 10*3/uL (ref 150–400)
RBC: 4.33 MIL/uL (ref 4.22–5.81)
RDW: 12.8 % (ref 11.5–15.5)
WBC Count: 5.4 10*3/uL (ref 4.0–10.5)
nRBC: 0 % (ref 0.0–0.2)

## 2021-05-26 LAB — CMP (CANCER CENTER ONLY)
ALT: 25 U/L (ref 0–44)
AST: 24 U/L (ref 15–41)
Albumin: 3.6 g/dL (ref 3.5–5.0)
Alkaline Phosphatase: 122 U/L (ref 38–126)
Anion gap: 8 (ref 5–15)
BUN: 15 mg/dL (ref 8–23)
CO2: 27 mmol/L (ref 22–32)
Calcium: 9 mg/dL (ref 8.9–10.3)
Chloride: 102 mmol/L (ref 98–111)
Creatinine: 0.99 mg/dL (ref 0.61–1.24)
GFR, Estimated: >60 mL/min (ref >60)
Glucose, Bld: 108 mg/dL — ABNORMAL HIGH (ref 70–99)
Potassium: 4.2 mmol/L (ref 3.5–5.1)
Sodium: 137 mmol/L (ref 135–145)
Total Bilirubin: 0.5 mg/dL (ref 0.3–1.2)
Total Protein: 7.5 g/dL (ref 6.5–8.1)

## 2021-05-26 LAB — TSH: TSH: 2.104 u[IU]/mL (ref 0.350–4.500)

## 2021-05-26 MED ORDER — PROCHLORPERAZINE MALEATE 10 MG PO TABS
10.0000 mg | ORAL_TABLET | Freq: Once | ORAL | Status: AC
  Administered 2021-05-26: 10 mg via ORAL
  Filled 2021-05-26: qty 1

## 2021-05-26 MED ORDER — SODIUM CHLORIDE 0.9 % IV SOLN
200.0000 mg | Freq: Once | INTRAVENOUS | Status: AC
  Administered 2021-05-26: 200 mg via INTRAVENOUS
  Filled 2021-05-26: qty 8

## 2021-05-26 MED ORDER — SODIUM CHLORIDE 0.9 % IV SOLN
500.0000 mg/m2 | Freq: Once | INTRAVENOUS | Status: AC
  Administered 2021-05-26: 1000 mg via INTRAVENOUS
  Filled 2021-05-26: qty 40

## 2021-05-26 MED ORDER — SODIUM CHLORIDE 0.9 % IV SOLN: Freq: Once | INTRAVENOUS | Status: AC

## 2021-05-26 NOTE — Patient Instructions (Signed)
[facility]  Discharge Instructions: ?Thank you for choosing [facility] to provide your oncology and hematology care.  ? ?If you have a lab appointment with the [facility], please go directly to the [facility] and check in at the registration area. ?  ?Wear comfortable clothing and clothing appropriate for easy access to any Portacath or PICC line.  ? ?We strive to give you quality time with your provider. You may need to reschedule your appointment if you arrive late (15 or more minutes).  Arriving late affects you and other patients whose appointments are after yours.  Also, if you miss three or more appointments without notifying the office, you may be dismissed from the clinic at the provider?s discretion.    ?  ?For prescription refill requests, have your pharmacy contact our office and allow 72 hours for refills to be completed.   ? ?Today you received the following chemotherapy and/or immunotherapy agents: Keytruda and Alimta    ?  ?To help prevent nausea and vomiting after your treatment, we encourage you to take your nausea medication as directed. ? ?BELOW ARE SYMPTOMS THAT SHOULD BE REPORTED IMMEDIATELY: ?*FEVER GREATER THAN 100.4 F (38 ?C) OR HIGHER ?*CHILLS OR SWEATING ?*NAUSEA AND VOMITING THAT IS NOT CONTROLLED WITH YOUR NAUSEA MEDICATION ?*UNUSUAL SHORTNESS OF BREATH ?*UNUSUAL BRUISING OR BLEEDING ?*URINARY PROBLEMS (pain or burning when urinating, or frequent urination) ?*BOWEL PROBLEMS (unusual diarrhea, constipation, pain near the anus) ?TENDERNESS IN MOUTH AND THROAT WITH OR WITHOUT PRESENCE OF ULCERS (sore throat, sores in mouth, or a toothache) ?UNUSUAL RASH, SWELLING OR PAIN  ?UNUSUAL VAGINAL DISCHARGE OR ITCHING  ? ?Items with * indicate a potential emergency and should be followed up as soon as possible or go to the Emergency Department if any problems should occur. ? ?Please show the CHEMOTHERAPY ALERT CARD or IMMUNOTHERAPY ALERT CARD at  check-in to the Emergency Department and triage nurse. ? ?Should you have questions after your visit or need to cancel or reschedule your appointment, please contact [facility]  Dept: [phone]  and follow the prompts.  Office hours are 8:00 a.m. to 4:30 p.m. Monday - Friday. Please note that voicemails left after 4:00 p.m. may not be returned until the following business day.  We are closed weekends and major holidays. You have access to a nurse at all times for urgent questions. Please call the main number to the clinic Dept: [phone] and follow the prompts. ? ? ?For any non-urgent questions, you may also contact your provider using MyChart. We now offer e-Visits for anyone 42 and older to request care online for non-urgent symptoms. For details visit [email or web address]. ?  ?Also download the MyChart app! Go to the app store, search "MyChart", open the app, select [facility], and log in with your MyChart username and password. ? ?Due to Covid, a mask is required upon entering the hospital/clinic. If you do not have a mask, one will be given to you upon arrival. For doctor visits, patients may have 1 support person aged 66 or older with them. For treatment visits, patients cannot have anyone with them due to current Covid guidelines and our immunocompromised population.  ? ?

## 2021-06-11 ENCOUNTER — Telehealth: Payer: Self-pay | Admitting: Internal Medicine

## 2021-06-11 NOTE — Telephone Encounter (Signed)
Called patient regarding upcoming appointments, left a voicemail. 

## 2021-06-12 ENCOUNTER — Other Ambulatory Visit: Payer: Self-pay

## 2021-06-12 ENCOUNTER — Inpatient Hospital Stay: Payer: No Typology Code available for payment source | Attending: Internal Medicine

## 2021-06-12 ENCOUNTER — Inpatient Hospital Stay (HOSPITAL_BASED_OUTPATIENT_CLINIC_OR_DEPARTMENT_OTHER): Payer: No Typology Code available for payment source | Admitting: Internal Medicine

## 2021-06-12 ENCOUNTER — Ambulatory Visit (HOSPITAL_COMMUNITY)
Admission: RE | Admit: 2021-06-12 | Discharge: 2021-06-12 | Disposition: A | Discharge status: Home / Self Care | Payer: No Typology Code available for payment source | Source: Ambulatory Visit | Attending: Physician Assistant | Admitting: Physician Assistant

## 2021-06-12 VITALS — BP 97/78 | HR 90 | Temp 95.8°F | Resp 18 | Wt 184.6 lb

## 2021-06-12 DIAGNOSIS — C342 Malignant neoplasm of middle lobe, bronchus or lung: Secondary | ICD-10-CM | POA: Insufficient documentation

## 2021-06-12 DIAGNOSIS — Z79899 Other long term (current) drug therapy: Secondary | ICD-10-CM | POA: Insufficient documentation

## 2021-06-12 DIAGNOSIS — Z86711 Personal history of pulmonary embolism: Secondary | ICD-10-CM | POA: Insufficient documentation

## 2021-06-12 DIAGNOSIS — C3490 Malignant neoplasm of unspecified part of unspecified bronchus or lung: Secondary | ICD-10-CM

## 2021-06-12 DIAGNOSIS — E538 Deficiency of other specified B group vitamins: Secondary | ICD-10-CM | POA: Insufficient documentation

## 2021-06-12 DIAGNOSIS — E785 Hyperlipidemia, unspecified: Secondary | ICD-10-CM | POA: Insufficient documentation

## 2021-06-12 DIAGNOSIS — Z5111 Encounter for antineoplastic chemotherapy: Secondary | ICD-10-CM | POA: Insufficient documentation

## 2021-06-12 LAB — CMP (CANCER CENTER ONLY)
ALT: 24 U/L (ref 0–44)
AST: 21 U/L (ref 15–41)
Albumin: 3.5 g/dL (ref 3.5–5.0)
Alkaline Phosphatase: 108 U/L (ref 38–126)
Anion gap: 5 (ref 5–15)
BUN: 16 mg/dL (ref 8–23)
CO2: 29 mmol/L (ref 22–32)
Calcium: 8.7 mg/dL — ABNORMAL LOW (ref 8.9–10.3)
Chloride: 104 mmol/L (ref 98–111)
Creatinine: 1.08 mg/dL (ref 0.61–1.24)
GFR, Estimated: >60 mL/min (ref >60)
Glucose, Bld: 99 mg/dL (ref 70–99)
Potassium: 4.7 mmol/L (ref 3.5–5.1)
Sodium: 138 mmol/L (ref 135–145)
Total Bilirubin: 0.5 mg/dL (ref 0.3–1.2)
Total Protein: 7.5 g/dL (ref 6.5–8.1)

## 2021-06-12 LAB — CBC WITH DIFFERENTIAL (CANCER CENTER ONLY)
Abs Immature Granulocytes: 0.03 10*3/uL (ref 0.00–0.07)
Basophils Absolute: 0 10*3/uL (ref 0.0–0.1)
Basophils Relative: 1 %
Eosinophils Absolute: 0.1 10*3/uL (ref 0.0–0.5)
Eosinophils Relative: 2 %
HCT: 37.8 % — ABNORMAL LOW (ref 39.0–52.0)
Hemoglobin: 12.6 g/dL — ABNORMAL LOW (ref 13.0–17.0)
Immature Granulocytes: 1 %
Lymphocytes Relative: 19 %
Lymphs Abs: 1.1 10*3/uL (ref 0.7–4.0)
MCH: 31.7 pg (ref 26.0–34.0)
MCHC: 33.3 g/dL (ref 30.0–36.0)
MCV: 95.2 fL (ref 80.0–100.0)
Monocytes Absolute: 0.9 10*3/uL (ref 0.1–1.0)
Monocytes Relative: 16 %
Neutro Abs: 3.5 10*3/uL (ref 1.7–7.7)
Neutrophils Relative %: 61 %
Platelet Count: 329 10*3/uL (ref 150–400)
RBC: 3.97 MIL/uL — ABNORMAL LOW (ref 4.22–5.81)
RDW: 13.7 % (ref 11.5–15.5)
WBC Count: 5.7 10*3/uL (ref 4.0–10.5)
nRBC: 0 % (ref 0.0–0.2)

## 2021-06-12 MED ORDER — PREDNISONE 20 MG PO TABS: ORAL_TABLET | ORAL | 0 refills | Status: DC

## 2021-06-12 MED ORDER — IOHEXOL 300 MG/ML  SOLN
100.0000 mL | Freq: Once | INTRAMUSCULAR | Status: AC | PRN
  Administered 2021-06-12: 100 mL via INTRAVENOUS

## 2021-06-12 MED ORDER — SODIUM CHLORIDE (PF) 0.9 % IJ SOLN
INTRAMUSCULAR | Status: AC
Start: 2021-06-12 — End: 2021-06-12
  Filled 2021-06-12: qty 50

## 2021-06-12 NOTE — Progress Notes (Signed)
?    [facility] ?[phone]   [phone] ? ?OFFICE PROGRESS NOTE ? ?[name], MD ?[address] ?[place] [address] ? ?DIAGNOSIS:  ?1) recurrent non-small cell lung cancer initially diagnosed as stage IIIc (T4, N3, M0) non-small cell lung cancer, adenocarcinoma presented with large right hilar mass in addition to bilateral hilar and mediastinal lymphadenopathy diagnosed in December 2020.  The patient has evidence for disease recurrence in October 2022. 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  Started on Xarelto on March 17, 2020 and currently 20 mg p.o. daily. ?  ?PRIOR THERAPY:  ?1) Weekly concurrent chemoradiation with carboplatin for an AUC of 2 and paclitaxel 45 mg/m2. First dose starting 01/30/2019.   Status post 6 cycles. ?2) Consolidation immunotherapy with Imfinzi 1500 mg IV every 4 weeks. First dose expected on 04/19/2019.  Status post 13 cycles. ?3) disease recurrence in October 2022. ?  ?CURRENT THERAPY: Systemic chemotherapy with carboplatin for AUC of 5, Alimta 500 Mg/M2 and Keytruda 200 Mg IV every 3 weeks.  First dose January 01, 2021.  Status post 8 cycles.  He had hypersensitivity reaction to carboplatin and this was discontinued after cycle #2.  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 ? ?INTERVAL HISTORY: ?Philip [name] 66 y.o. male returns to the clinic today for follow-up visit.  The patient is feeling fine today with no concerning complaints except for shortness of breath with exertion that has increased recently.  He denied having any chest pain but has persistent dry cough with no hemoptysis.  He has no nausea, vomiting, diarrhea or constipation.  He has no headache or visual changes.  He has several days of fatigue after his systemic treatment.  The patient has  no headache or visual changes.  He had repeat CT scan of the chest, abdomen pelvis performed earlier today and he is here for evaluation and discussion of his scan results before receiving his treatment tomorrow. ? ? ?MEDICAL HISTORY: ?Past Medical History:  ?Diagnosis Date  ? Hyperlipidemia   ? lung ca dx'd 12/2018  ? Pneumonia   ? Pulmonary embolus ([place])   ? Tobacco abuse   ? ? ?ALLERGIES:  is allergic to carboplatin, clonazepam, and norco [hydrocodone-acetaminophen]. ? ?MEDICATIONS:  ?Current Outpatient Medications  ?Medication Sig Dispense Refill  ? albuterol (VENTOLIN HFA) 108 (90 Base) MCG/ACT inhaler Inhale 2 puffs into the lungs every 4 (four) hours as needed for wheezing or shortness of breath. 18 g 6  ? atorvastatin (LIPITOR) 20 MG tablet Take 1 tablet by mouth daily.    ? dextromethorphan-guaiFENesin (MUCINEX DM) 30-600 MG 12hr tablet Take 1 tablet by mouth 2 (two) times daily as needed for cough. 40 tablet 0  ? famotidine (PEPCID) 20 MG tablet TAKE 1 TABLET BY MOUTH TWICE A DAY 180 tablet 1  ? fexofenadine (ALLEGRA) 180 MG tablet Take 180 mg by mouth daily.    ? folic acid (FOLVITE) 1 MG tablet TAKE 1 TABLET BY MOUTH EVERY DAY 90 tablet 1  ? HYDROcodone-acetaminophen (NORCO) 5-325 MG tablet Take 1 tablet by mouth every 6 (six) hours as needed for moderate pain. 5 tablet 0  ? naphazoline-pheniramine (VISINE) 0.025-0.3 % ophthalmic solution 1 drop into both eyes as needed for red eye    ? prochlorperazine (COMPAZINE) 10 MG tablet Take 1 tablet (10 mg total)  by mouth every 6 (six) hours as needed for nausea or vomiting. (Patient not taking: Reported on 04/14/2021) 30 tablet 0  ? rivaroxaban (XARELTO) 20 MG TABS tablet Take 1 tablet (20 mg total) by mouth daily with supper. 30 tablet 11  ? Tiotropium Bromide-Olodaterol (STIOLTO RESPIMAT) 2.5-2.5 MCG/ACT AERS Inhale 2 puffs into the lungs daily. 1 each 11  ? ?No current facility-administered medications for this visit.  ? ?Facility-Administered Medications  Ordered in Other Visits  ?Medication Dose Route Frequency Provider Last Rate Last Admin  ? sodium chloride (PF) 0.9 % injection           ? ? ?SURGICAL HISTORY:  ?Past Surgical History:  ?Procedure Laterality Date  ? COLONOSCOPY    ? HAND SURGERY    ? VIDEO BRONCHOSCOPY WITH ENDOBRONCHIAL ULTRASOUND N/A 12/22/2018  ? Procedure: VIDEO BRONCHOSCOPY WITH ENDOBRONCHIAL ULTRASOUND;  Surgeon: Garner Nash, DO;  Location: [facility];  Service: Thoracic;  Laterality: N/A;  ? VIDEO BRONCHOSCOPY WITH ENDOBRONCHIAL ULTRASOUND N/A 01/09/2019  ? Procedure: VIDEO BRONCHOSCOPY WITH ENDOBRONCHIAL ULTRASOUND WITH FLUORO;  Surgeon: Garner Nash, DO;  Location: [facility];  Service: Thoracic;  Laterality: N/A;  ? VIDEO BRONCHOSCOPY WITH RADIAL ENDOBRONCHIAL ULTRASOUND N/A 12/22/2018  ? Procedure: RADIAL ENDOBRONCHIAL ULTRASOUND;  Surgeon: Garner Nash, DO;  Location: [facility] OR;  Service: Thoracic;  Laterality: N/A;  ? ? ?REVIEW OF SYSTEMS:  Constitutional: positive for fatigue ?Eyes: negative ?Ears, nose, mouth, throat, and face: negative ?Respiratory: positive for cough and dyspnea on exertion ?Cardiovascular: negative ?Gastrointestinal: negative ?Genitourinary:negative ?Integument/breast: negative ?Hematologic/lymphatic: negative ?Musculoskeletal:negative ?Neurological: negative ?Behavioral/Psych: negative ?Endocrine: negative ?Allergic/Immunologic: negative  ? ?PHYSICAL EXAMINATION: General appearance: alert, cooperative, fatigued, and no distress ?Head: Normocephalic, without obvious abnormality, atraumatic ?Neck: no adenopathy, no JVD, supple, symmetrical, trachea midline, and thyroid not enlarged, symmetric, no tenderness/mass/nodules ?Lymph nodes: Cervical, supraclavicular, and axillary nodes normal. ?Resp: rales LUL ?Back: symmetric, no curvature. ROM normal. No CVA tenderness. ?Cardio: regular rate and rhythm, S1, S2 normal, no murmur, click, rub or gallop ?GI: soft, non-tender; bowel sounds normal; no masses,  no  organomegaly ?Extremities: extremities normal, atraumatic, no cyanosis or edema ?Neurologic: Alert and oriented X 3, normal strength and tone. Normal symmetric reflexes. Normal coordination and gait ? ?ECOG PERFORMANCE STATUS: 1 - Symptomatic but completely ambulatory ? ?Blood pressure 97/78, pulse 90, temperature (!) 95.8 ?F (35.4 ?C), temperature source Tympanic, resp. rate 18, weight 184 lb 9 oz (83.7 kg), SpO2 92 %. ? ?LABORATORY DATA: ?Lab Results  ?Component Value Date  ? WBC 5.4 05/26/2021  ? HGB 13.9 05/26/2021  ? HCT 40.8 05/26/2021  ? MCV 94.2 05/26/2021  ? PLT 319 05/26/2021  ? ? ?  Chemistry   ?   ?Component Value Date/Time  ? NA 137 05/26/2021 0921  ? K 4.2 05/26/2021 0921  ? CL 102 05/26/2021 0921  ? CO2 27 05/26/2021 0921  ? BUN 15 05/26/2021 0921  ? CREATININE 0.99 05/26/2021 0921  ?    ?Component Value Date/Time  ? CALCIUM 9.0 05/26/2021 0921  ? ALKPHOS 122 05/26/2021 0921  ? AST 24 05/26/2021 0921  ? ALT 25 05/26/2021 0921  ? BILITOT 0.5 05/26/2021 4132  ?  ? ? ? ?RADIOGRAPHIC STUDIES: ?No results found. ? ? ?ASSESSMENT AND PLAN: This is a very pleasant 66 years old white male with metastatic non-small cell lung cancer that was initially diagnosed as stage IIIc non-small cell lung cancer, adenocarcinoma diagnosed in December 2020 ?The patient completed a course of concurrent chemoradiation with weekly  carboplatin and paclitaxel status post 6 cycles and he tolerated his treatment well and has partial response. ?He underwent consolidation treatment with immunotherapy with Imfinzi 1500 mg IV every 4 weeks status post 13 cycles. ?He was on observation and feeling fine with no concerning complaints except for mild cough as well as the left shoulder pain. ?His scan showed increase in the size of the soft tissue nodule in the right middle lobe adjacent to the postradiation changes highly suspicious for recurrent disease.  There was also soft tissue of the right hilum/as ago esophageal recess unchanged at  but increased compared to remote prior studies. ?The patient had a PET scan that showed hypermetabolic nodule in the right lower lobe adjacent to the post radiation treatment site increased in size and consistent with

## 2021-06-13 ENCOUNTER — Encounter: Payer: Self-pay | Admitting: Physician Assistant

## 2021-06-13 ENCOUNTER — Inpatient Hospital Stay: Payer: No Typology Code available for payment source

## 2021-06-13 ENCOUNTER — Encounter: Payer: Self-pay | Admitting: Internal Medicine

## 2021-06-13 VITALS — BP 122/78 | HR 92 | Temp 97.7°F | Resp 18

## 2021-06-13 DIAGNOSIS — Z5111 Encounter for antineoplastic chemotherapy: Secondary | ICD-10-CM | POA: Diagnosis present

## 2021-06-13 DIAGNOSIS — Z79899 Other long term (current) drug therapy: Secondary | ICD-10-CM | POA: Diagnosis not present

## 2021-06-13 DIAGNOSIS — E785 Hyperlipidemia, unspecified: Secondary | ICD-10-CM | POA: Diagnosis not present

## 2021-06-13 DIAGNOSIS — C3491 Malignant neoplasm of unspecified part of right bronchus or lung: Secondary | ICD-10-CM

## 2021-06-13 DIAGNOSIS — C342 Malignant neoplasm of middle lobe, bronchus or lung: Secondary | ICD-10-CM | POA: Diagnosis not present

## 2021-06-13 DIAGNOSIS — Z86711 Personal history of pulmonary embolism: Secondary | ICD-10-CM | POA: Diagnosis not present

## 2021-06-13 DIAGNOSIS — E538 Deficiency of other specified B group vitamins: Secondary | ICD-10-CM | POA: Diagnosis not present

## 2021-06-13 MED ORDER — SODIUM CHLORIDE 0.9 % IV SOLN: Freq: Once | INTRAVENOUS | Status: AC

## 2021-06-13 MED ORDER — PROCHLORPERAZINE MALEATE 10 MG PO TABS
10.0000 mg | ORAL_TABLET | Freq: Once | ORAL | Status: AC
  Administered 2021-06-13: 10 mg via ORAL
  Filled 2021-06-13: qty 1

## 2021-06-13 MED ORDER — CYANOCOBALAMIN 1000 MCG/ML IJ SOLN
1000.0000 ug | Freq: Once | INTRAMUSCULAR | Status: AC
  Administered 2021-06-13: 1000 ug via INTRAMUSCULAR
  Filled 2021-06-13: qty 1

## 2021-06-13 MED ORDER — SODIUM CHLORIDE 0.9 % IV SOLN
500.0000 mg/m2 | Freq: Once | INTRAVENOUS | Status: AC
  Administered 2021-06-13: 1000 mg via INTRAVENOUS
  Filled 2021-06-13: qty 40

## 2021-06-13 NOTE — Patient Instructions (Signed)
[facility]  Discharge Instructions: ?Thank you for choosing [facility] to provide your oncology and hematology care.  ? ?If you have a lab appointment with the [facility], please go directly to the [facility] and check in at the registration area. ?  ?Wear comfortable clothing and clothing appropriate for easy access to any Portacath or PICC line.  ? ?We strive to give you quality time with your provider. You may need to reschedule your appointment if you arrive late (15 or more minutes).  Arriving late affects you and other patients whose appointments are after yours.  Also, if you miss three or more appointments without notifying the office, you may be dismissed from the clinic at the provider?s discretion.    ?  ?For prescription refill requests, have your pharmacy contact our office and allow 72 hours for refills to be completed.   ? ?Today you received the following chemotherapy and/or immunotherapy agent: Alimta    ?  ?To help prevent nausea and vomiting after your treatment, we encourage you to take your nausea medication as directed. ? ?BELOW ARE SYMPTOMS THAT SHOULD BE REPORTED IMMEDIATELY: ?*FEVER GREATER THAN 100.4 F (38 ?C) OR HIGHER ?*CHILLS OR SWEATING ?*NAUSEA AND VOMITING THAT IS NOT CONTROLLED WITH YOUR NAUSEA MEDICATION ?*UNUSUAL SHORTNESS OF BREATH ?*UNUSUAL BRUISING OR BLEEDING ?*URINARY PROBLEMS (pain or burning when urinating, or frequent urination) ?*BOWEL PROBLEMS (unusual diarrhea, constipation, pain near the anus) ?TENDERNESS IN MOUTH AND THROAT WITH OR WITHOUT PRESENCE OF ULCERS (sore throat, sores in mouth, or a toothache) ?UNUSUAL RASH, SWELLING OR PAIN  ?UNUSUAL VAGINAL DISCHARGE OR ITCHING  ? ?Items with * indicate a potential emergency and should be followed up as soon as possible or go to the Emergency Department if any problems should occur. ? ?Please show the CHEMOTHERAPY ALERT CARD or IMMUNOTHERAPY ALERT CARD at check-in to the  Emergency Department and triage nurse. ? ?Should you have questions after your visit or need to cancel or reschedule your appointment, please contact [facility]  Dept: [phone]  and follow the prompts.  Office hours are 8:00 a.m. to 4:30 p.m. Monday - Friday. Please note that voicemails left after 4:00 p.m. may not be returned until the following business day.  We are closed weekends and major holidays. You have access to a nurse at all times for urgent questions. Please call the main number to the clinic Dept: [phone] and follow the prompts. ? ? ?For any non-urgent questions, you may also contact your provider using MyChart. We now offer e-Visits for anyone 72 and older to request care online for non-urgent symptoms. For details visit [email or web address]. ?  ?Also download the MyChart app! Go to the app store, search "MyChart", open the app, select [facility], and log in with your MyChart username and password. ? ?Due to Covid, a mask is required upon entering the hospital/clinic. If you do not have a mask, one will be given to you upon arrival. For doctor visits, patients may have 1 support person aged 15 or older with them. For treatment visits, patients cannot have anyone with them due to current Covid guidelines and our immunocompromised population.  ? ?

## 2021-06-16 ENCOUNTER — Other Ambulatory Visit: Payer: No Typology Code available for payment source

## 2021-06-16 ENCOUNTER — Ambulatory Visit: Payer: No Typology Code available for payment source | Admitting: Internal Medicine

## 2021-06-16 ENCOUNTER — Ambulatory Visit: Payer: No Typology Code available for payment source

## 2021-07-01 NOTE — Progress Notes (Signed)
[facility] OFFICE PROGRESS NOTE  [name], MD [address] [place] [address]  DIAGNOSIS:  1) recurrent non-small cell lung cancer initially diagnosed as stage IIIc (T4, N3, M0) non-small cell lung cancer, adenocarcinoma presented with large right hilar mass in addition to bilateral hilar and mediastinal lymphadenopathy diagnosed in December 2020.  The patient has evidence for disease recurrence in October 2022. 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  Started on Xarelto on March 17, 2020 and currently 20 mg p.o. daily.    PRIOR THERAPY:  1) Weekly concurrent chemoradiation with carboplatin for an AUC of 2 and paclitaxel 45 mg/m2. First dose starting 01/30/2019.   Status post 6 cycles. 2) Consolidation immunotherapy with Imfinzi 1500 mg IV every 4 weeks. First dose expected on 04/19/2019.  Status post 13 cycles. 3) disease recurrence in October 2022.  CURRENT THERAPY:  Systemic chemotherapy with carboplatin for AUC of 5, Alimta 500 Mg/M2 and Keytruda 200 Mg IV every 3 weeks.  First dose January 01, 2021.  Status post 9 cycles.  He had hypersensitivity reaction to carboplatin and this was discontinued after cycle #2.  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  INTERVAL HISTORY: Philip [name] 66 y.o. male returns to the clinic today for a follow-up visit.  When the patient was seen in the clinic 3 weeks ago by Dr. Julien Nordmann, he had a restaging CT scan at that time which showed possible pneumonitis.  Therefore, [name] was discontinued from the treatment plan and the patient was started on a high-dose prednisone taper.  He is currently taking 20mg  of prednisone. As expected, he has increased appetite and has gained several pounds. He also has insomnia. He is currently undergoing treatment with  single agent chemotherapy with Alimta.  Since last being seen he denies any new concerning complaints except about 2 weeks ago he was straining with a bowel movement and had bright red blood per rectum. He reports he had this with bowel movements for about 1 week but it has resolved at this time. He has not had any bleeding in about 2 weeks. Today, he denies any fever, chills, or night sweats.    Regarding his breathing, he reports that since starting prednisone, his cough is pretty much resolved. Denies any hemoptysis or chest pain. He reports his baseline shortness of breath with activity. He denies any nausea, vomiting, diarrhea, or constipation.  Denies any headache or visual changes. He actually mentions his vision is better since getting a new prescription.  Denies any rashes or skin changes.  He is here today for evaluation and repeat blood work before starting cycle #10 with single agent chemotherapy with Alimta.     MEDICAL HISTORY: Past Medical History:  Diagnosis Date   Hyperlipidemia    lung ca dx'd 12/2018   Pneumonia    Pulmonary embolus (HCC)    Tobacco abuse     ALLERGIES:  is allergic to carboplatin, clonazepam, and norco [hydrocodone-acetaminophen].  MEDICATIONS:  Current Outpatient Medications  Medication Sig Dispense Refill   albuterol (VENTOLIN HFA) 108 (90 Base) MCG/ACT inhaler Inhale 2 puffs into the lungs every 4 (four) hours as needed for wheezing or shortness of breath. 18 g 6   atorvastatin (LIPITOR) 20 MG tablet Take 1 tablet by mouth daily.     dextromethorphan-guaiFENesin (MUCINEX DM) 30-600 MG 12hr  tablet Take 1 tablet by mouth 2 (two) times daily as needed for cough. 40 tablet 0   famotidine (PEPCID) 20 MG tablet TAKE 1 TABLET BY MOUTH TWICE A DAY 180 tablet 1   fexofenadine (ALLEGRA) 180 MG tablet Take 180 mg by mouth daily.     folic acid (FOLVITE) 1 MG tablet TAKE 1 TABLET BY MOUTH EVERY DAY 90 tablet 1   HYDROcodone-acetaminophen (NORCO) 5-325 MG tablet  Take 1 tablet by mouth every 6 (six) hours as needed for moderate pain. 5 tablet 0   naphazoline-pheniramine (VISINE) 0.025-0.3 % ophthalmic solution 1 drop into both eyes as needed for red eye     predniSONE (DELTASONE) 20 MG tablet 4 tablet p.o. daily for 7 days followed by 3 tablet p.o. daily for 7 days followed by 2 tablet p.o. daily for 7 days followed by 1 tablet p.o. daily for 7 days followed by half tablet p.o. daily for 7 days. 74 tablet 0   prochlorperazine (COMPAZINE) 10 MG tablet Take 1 tablet (10 mg total) by mouth every 6 (six) hours as needed for nausea or vomiting. (Patient not taking: Reported on 04/14/2021) 30 tablet 0   rivaroxaban (XARELTO) 20 MG TABS tablet Take 1 tablet (20 mg total) by mouth daily with supper. 30 tablet 11   Tiotropium Bromide-Olodaterol (STIOLTO RESPIMAT) 2.5-2.5 MCG/ACT AERS Inhale 2 puffs into the lungs daily. 1 each 11   No current facility-administered medications for this visit.    SURGICAL HISTORY:  Past Surgical History:  Procedure Laterality Date   COLONOSCOPY     HAND SURGERY     VIDEO BRONCHOSCOPY WITH ENDOBRONCHIAL ULTRASOUND N/A 12/22/2018   Procedure: VIDEO BRONCHOSCOPY WITH ENDOBRONCHIAL ULTRASOUND;  Surgeon: Garner Nash, DO;  Location: [facility];  Service: Thoracic;  Laterality: N/A;   VIDEO BRONCHOSCOPY WITH ENDOBRONCHIAL ULTRASOUND N/A 01/09/2019   Procedure: VIDEO BRONCHOSCOPY WITH ENDOBRONCHIAL ULTRASOUND WITH FLUORO;  Surgeon: Garner Nash, DO;  Location: [facility];  Service: Thoracic;  Laterality: N/A;   VIDEO BRONCHOSCOPY WITH RADIAL ENDOBRONCHIAL ULTRASOUND N/A 12/22/2018   Procedure: RADIAL ENDOBRONCHIAL ULTRASOUND;  Surgeon: Garner Nash, DO;  Location: [facility];  Service: Thoracic;  Laterality: N/A;    REVIEW OF SYSTEMS:   Review of Systems  Constitutional: Positive for weight gain. Positive for increased appetite. Negative for chills and fever.  HENT: Negative for mouth sores, nosebleeds, sore throat and trouble swallowing.    Eyes: Negative for eye problems and icterus.  Respiratory: positive for baseline shortness of breath with exertion. Positive for improved cough. Negative for hemoptysis and wheezing.   Cardiovascular: Negative for chest pain and leg swelling.  Gastrointestinal: Positive for bright red blood with bowel movements over 2 weeks ago. Resolved at this time. Negative for abdominal pain, constipation, diarrhea, nausea and vomiting.  Genitourinary: Negative for bladder incontinence, difficulty urinating, dysuria, frequency and hematuria.   Musculoskeletal: Negative for back pain, gait problem, neck pain and neck stiffness.  Skin: Negative for itching and rash.  Neurological: Negative for dizziness, extremity weakness, gait problem, headaches, light-headedness and seizures.  Hematological: Negative for adenopathy. Does not bruise/bleed easily.  Psychiatric/Behavioral: Negative for confusion, depression and sleep disturbance. The patient is not nervous/anxious.     PHYSICAL EXAMINATION:  Blood pressure (!) 123/91, pulse 91, temperature (!) 96.2 F (35.7 C), temperature source Tympanic, resp. rate 17, weight 191 lb 6 oz (86.8 kg), SpO2 93 %.  ECOG PERFORMANCE STATUS: 1  Physical Exam  Constitutional: Oriented to person, place, and time and well-developed,  well-nourished, and in no distress.  HENT:  Head: Normocephalic and atraumatic.  Mouth/Throat: Oropharynx is clear and moist. No oropharyngeal exudate.  Eyes: Conjunctivae are normal. Right eye exhibits no discharge. Left eye exhibits no discharge. No scleral icterus.  Neck: Normal range of motion. Neck supple.  Cardiovascular: Normal rate, regular rhythm, normal heart sounds and intact distal pulses.   Pulmonary/Chest: Effort normal and breath sounds normal. No respiratory distress. No wheezes. No rales.  Abdominal: Soft. Bowel sounds are normal. Exhibits no distension and no mass. There is no tenderness.  Musculoskeletal: Normal range of  motion. Exhibits no edema.  Lymphadenopathy:    No cervical adenopathy.  Neurological: Alert and oriented to person, place, and time. Exhibits normal muscle tone. Gait normal. Coordination normal.  Skin: Skin is warm and dry. No rash noted. Not diaphoretic. No erythema. No pallor.  Psychiatric: Mood, memory and judgment normal.  Vitals reviewed.  LABORATORY DATA: Lab Results  Component Value Date   WBC 8.6 07/07/2021   HGB 13.9 07/07/2021   HCT 41.4 07/07/2021   MCV 97.0 07/07/2021   PLT 185 07/07/2021      Chemistry      Component Value Date/Time   NA 139 07/07/2021 0914   K 4.0 07/07/2021 0914   CL 107 07/07/2021 0914   CO2 23 07/07/2021 0914   BUN 20 07/07/2021 0914   CREATININE 0.95 07/07/2021 0914      Component Value Date/Time   CALCIUM 9.4 07/07/2021 0914   ALKPHOS 68 07/07/2021 0914   AST 20 07/07/2021 0914   ALT 31 07/07/2021 0914   BILITOT 0.5 07/07/2021 0914       RADIOGRAPHIC STUDIES:  CT Chest W Contrast  Result Date: 06/13/2021 CLINICAL DATA:  Primary Cancer Type: Lung Imaging Indication: Assess response to therapy Interval therapy since last imaging? Yes Initial Cancer Diagnosis Date: 01/09/2019; Established by: Biopsy-proven Detailed Pathology: Stage IIIc non-small cell lung cancer, adenocarcinoma. Primary Tumor location: Right hilum. Recurrence? Yes; Date(s) of recurrence: 11/28/2020; Established by: Imaging only Surgeries: No. Chemotherapy: Yes; Ongoing? Yes; Most recent administration: 06/13/2021 Immunotherapy?  Yes; Type: Keytruda; Ongoing? No Radiation therapy? Yes; Date Range: 02/06/2019 - 03/08/2019; Target: Right lung * Tracking Code: BO * EXAM: CT CHEST ABDOMEN AND PELVIS WITH CONTRAST TECHNIQUE: Multidetector CT imaging of the chest was performed during intravenous contrast administration. RADIATION DOSE REDUCTION: This exam was performed according to the departmental dose-optimization program which includes automated exposure control, adjustment of  the mA and/or kV according to patient size and/or use of iterative reconstruction technique. CONTRAST:  166mL OMNIPAQUE IOHEXOL 300 MG/ML  SOLN COMPARISON:  Most recent CT chest, abdomen and pelvis 03/17/2021. 11/27/2020 PET-CT. FINDINGS: CT CHEST FINDINGS Cardiovascular: Scattered aortic atherosclerosis. Normal heart size. Left coronary artery calcifications. No pericardial effusion. The right interlobar and more distal pulmonary arteries proximal to dense fibrosis of right lower lobe appear chronically occluded, as does the right lower lobe pulmonary vein (series 504, 28). Mediastinum/Nodes: Unchanged prominent subcentimeter mediastinal nodes (series 504, image 20). Saber sheath trachea. Thyroid gland and esophagus demonstrate no significant findings. Lungs/Pleura: Moderate to severe centrilobular emphysema. Unchanged post treatment/post radiation appearance of the chest with bilateral perihilar and paramedian fibrosis and consolidation. New, scattered areas of ground-glass airspace opacity throughout the periphery of both lungs (series 505, image 60, 48). No pleural effusion or pneumothorax. Musculoskeletal: No chest wall mass or suspicious osseous lesions identified. CT ABDOMEN PELVIS FINDINGS Hepatobiliary: No solid liver abnormality is seen. No gallstones, gallbladder wall thickening, or biliary dilatation. Pancreas:  Unremarkable. No pancreatic ductal dilatation or surrounding inflammatory changes. Spleen: Normal in size without significant abnormality. Adrenals/Urinary Tract: Stable, definitively benign bilateral fat containing adrenal adenomata, not previously PET avid. Unchanged prominent right renal pelvis. Kidneys are otherwise normal, without renal calculi, solid lesion, or hydronephrosis. Bladder is unremarkable. Stomach/Bowel: Stomach is within normal limits. Appendix appears normal. No evidence of bowel wall thickening, distention, or inflammatory changes. Vascular/Lymphatic: Aortic atherosclerosis.  No enlarged abdominal or pelvic lymph nodes. Reproductive: No mass or other abnormality. Other: No abdominal wall hernia or abnormality. No ascites. Musculoskeletal: No acute osseous findings. IMPRESSION: 1. Unchanged post treatment/post radiation appearance of the chest with dense bilateral perihilar and paramedian fibrosis and consolidation. 2. New, scattered areas of ground-glass airspace opacity throughout the periphery of both lungs. Findings are consistent with nonspecific infectious or inflammatory pneumonitis, the differential considerations including mild COVID airspace disease as well as drug toxicity 3. No evidence of metastatic disease or lymphadenopathy in the abdomen or pelvis. 4. Emphysema. 5. Coronary artery disease. Aortic Atherosclerosis (ICD10-I70.0) and Emphysema (ICD10-J43.9). Electronically Signed   By: Delanna Ahmadi M.D.   On: 06/13/2021 10:45   CT Abdomen Pelvis W Contrast  Result Date: 06/13/2021 CLINICAL DATA:  Primary Cancer Type: Lung Imaging Indication: Assess response to therapy Interval therapy since last imaging? Yes Initial Cancer Diagnosis Date: 01/09/2019; Established by: Biopsy-proven Detailed Pathology: Stage IIIc non-small cell lung cancer, adenocarcinoma. Primary Tumor location: Right hilum. Recurrence? Yes; Date(s) of recurrence: 11/28/2020; Established by: Imaging only Surgeries: No. Chemotherapy: Yes; Ongoing? Yes; Most recent administration: 06/13/2021 Immunotherapy?  Yes; Type: Keytruda; Ongoing? No Radiation therapy? Yes; Date Range: 02/06/2019 - 03/08/2019; Target: Right lung * Tracking Code: BO * EXAM: CT CHEST ABDOMEN AND PELVIS WITH CONTRAST TECHNIQUE: Multidetector CT imaging of the chest was performed during intravenous contrast administration. RADIATION DOSE REDUCTION: This exam was performed according to the departmental dose-optimization program which includes automated exposure control, adjustment of the mA and/or kV according to patient size and/or use of  iterative reconstruction technique. CONTRAST:  141mL OMNIPAQUE IOHEXOL 300 MG/ML  SOLN COMPARISON:  Most recent CT chest, abdomen and pelvis 03/17/2021. 11/27/2020 PET-CT. FINDINGS: CT CHEST FINDINGS Cardiovascular: Scattered aortic atherosclerosis. Normal heart size. Left coronary artery calcifications. No pericardial effusion. The right interlobar and more distal pulmonary arteries proximal to dense fibrosis of right lower lobe appear chronically occluded, as does the right lower lobe pulmonary vein (series 504, 28). Mediastinum/Nodes: Unchanged prominent subcentimeter mediastinal nodes (series 504, image 20). Saber sheath trachea. Thyroid gland and esophagus demonstrate no significant findings. Lungs/Pleura: Moderate to severe centrilobular emphysema. Unchanged post treatment/post radiation appearance of the chest with bilateral perihilar and paramedian fibrosis and consolidation. New, scattered areas of ground-glass airspace opacity throughout the periphery of both lungs (series 505, image 60, 48). No pleural effusion or pneumothorax. Musculoskeletal: No chest wall mass or suspicious osseous lesions identified. CT ABDOMEN PELVIS FINDINGS Hepatobiliary: No solid liver abnormality is seen. No gallstones, gallbladder wall thickening, or biliary dilatation. Pancreas: Unremarkable. No pancreatic ductal dilatation or surrounding inflammatory changes. Spleen: Normal in size without significant abnormality. Adrenals/Urinary Tract: Stable, definitively benign bilateral fat containing adrenal adenomata, not previously PET avid. Unchanged prominent right renal pelvis. Kidneys are otherwise normal, without renal calculi, solid lesion, or hydronephrosis. Bladder is unremarkable. Stomach/Bowel: Stomach is within normal limits. Appendix appears normal. No evidence of bowel wall thickening, distention, or inflammatory changes. Vascular/Lymphatic: Aortic atherosclerosis. No enlarged abdominal or pelvic lymph nodes. Reproductive:  No mass or other abnormality. Other: No abdominal wall hernia or  abnormality. No ascites. Musculoskeletal: No acute osseous findings. IMPRESSION: 1. Unchanged post treatment/post radiation appearance of the chest with dense bilateral perihilar and paramedian fibrosis and consolidation. 2. New, scattered areas of ground-glass airspace opacity throughout the periphery of both lungs. Findings are consistent with nonspecific infectious or inflammatory pneumonitis, the differential considerations including mild COVID airspace disease as well as drug toxicity 3. No evidence of metastatic disease or lymphadenopathy in the abdomen or pelvis. 4. Emphysema. 5. Coronary artery disease. Aortic Atherosclerosis (ICD10-I70.0) and Emphysema (ICD10-J43.9). Electronically Signed   By: Delanna Ahmadi M.D.   On: 06/13/2021 10:45     ASSESSMENT/PLAN:  This is a very pleasant 66 year old Caucasian male initially diagnosed with stage IIIc non-small cell lung cancer, adenocarcinoma.  He was diagnosed in 2020. Molecular studies negative for any actionable mutations.  He was found to have disease recurrence in October 2022. No actionable mutations by guardant 360.   The patient initially completed concurrent chemoradiation with weekly carboplatin for an AUC of 2 and paclitaxel 45 mg per metered squared.  He status post 6 cycles.  He tolerated this well with a partial response.  He then underwent consolidation immunotherapy with Imfinzi 1500 mg IV every 4 weeks.  He status post 13 cycles.    In September 2022, the patient scan showed an increase in the size of the soft tissue nodule in the right middle lobe adjacent to the post radiation changes which is highly suspicious for disease recurrence.  He also has a soft tissue of the right hilum/azygoesophageal recess increased compared to remote prior studies.   The patient a PET scan that showed hypermetabolic nodule in the right lower lobe adjacent to the post radiation treatment  site is increased consistent with lung cancer recurrence.  There is also a second hypermetabolic nodule in the right lower lobe above the diaphragm concerning for recurrence and hypermetabolic contralateral lymph node in the high left paratracheal mediastinum also concerning for metastatic adenopathy recurrence.   Dr. Julien Nordmann arranged for the patient to have guardant 360 molecular testing performed which showed no actionable mutations.   Therefore, Dr. Julien Nordmann recommend the patient start palliative systemic chemotherapy with carboplatin for an AUC of 5, Alimta 500 mg per metered square, Keytruda 200 mg IV every 3 weeks. He is status post 9 cycles of treatment. Starting from cycle #5, the patient started maintenance alimta 500 mg/m2 and Keytruda 200 mg IV every 3 weeks.  [name] was discontinued starting from cycle #9 due to possible pneumonitis.   The patient is currently on a high-dose prednisone taper.  He is currently taking 20 milligrams p.o. daily. He then will take 10 mg for 1 week before stopping. I reviewed this with the patient today.   Labs were reviewed.  Recommend that he proceed with cycle #10 today scheduled with single agent chemotherapy with Alimta.  We will see him back for follow-up visit in 3 weeks for evaluation and repeat blood work before starting cycle #11.  The patient will continue taking his prednisone taper.  For his history of pulmonary embolism he will continue taking Xarelto. For his rectal bleeding, he reports he was straining. We will monitor for now. He has not had any rectal bleeding in over 2 weeks. No significant anemia on labs.   The patient was advised to call immediately if he has any concerning symptoms in the interval. The patient voices understanding of current disease status and treatment options and is in agreement with the current care plan. All questions  were answered. The patient knows to call the clinic with any problems, questions or concerns. We  can certainly see the patient much sooner if necessary         No orders of the defined types were placed in this encounter.    The total time spent in the appointment was 20-29 minutes.   Sherise Geerdes L Jhana Giarratano, [name] 07/07/21

## 2021-07-02 ENCOUNTER — Telehealth: Payer: Self-pay | Admitting: Medical Oncology

## 2021-07-02 NOTE — Telephone Encounter (Signed)
Faxed Ct scan results.

## 2021-07-07 ENCOUNTER — Inpatient Hospital Stay: Payer: No Typology Code available for payment source

## 2021-07-07 ENCOUNTER — Inpatient Hospital Stay (HOSPITAL_BASED_OUTPATIENT_CLINIC_OR_DEPARTMENT_OTHER): Payer: No Typology Code available for payment source | Admitting: Physician Assistant

## 2021-07-07 ENCOUNTER — Inpatient Hospital Stay: Payer: No Typology Code available for payment source | Attending: Internal Medicine

## 2021-07-07 ENCOUNTER — Other Ambulatory Visit: Payer: Self-pay

## 2021-07-07 ENCOUNTER — Other Ambulatory Visit: Payer: Self-pay | Admitting: Internal Medicine

## 2021-07-07 VITALS — BP 123/91 | HR 91 | Temp 96.2°F | Resp 17 | Wt 191.4 lb

## 2021-07-07 DIAGNOSIS — Z86711 Personal history of pulmonary embolism: Secondary | ICD-10-CM | POA: Diagnosis not present

## 2021-07-07 DIAGNOSIS — Z79899 Other long term (current) drug therapy: Secondary | ICD-10-CM | POA: Insufficient documentation

## 2021-07-07 DIAGNOSIS — C3491 Malignant neoplasm of unspecified part of right bronchus or lung: Secondary | ICD-10-CM

## 2021-07-07 DIAGNOSIS — C3490 Malignant neoplasm of unspecified part of unspecified bronchus or lung: Secondary | ICD-10-CM

## 2021-07-07 DIAGNOSIS — Z923 Personal history of irradiation: Secondary | ICD-10-CM | POA: Diagnosis not present

## 2021-07-07 DIAGNOSIS — Z7901 Long term (current) use of anticoagulants: Secondary | ICD-10-CM | POA: Insufficient documentation

## 2021-07-07 DIAGNOSIS — M25512 Pain in left shoulder: Secondary | ICD-10-CM | POA: Diagnosis not present

## 2021-07-07 DIAGNOSIS — Z7952 Long term (current) use of systemic steroids: Secondary | ICD-10-CM | POA: Diagnosis not present

## 2021-07-07 DIAGNOSIS — J432 Centrilobular emphysema: Secondary | ICD-10-CM | POA: Diagnosis not present

## 2021-07-07 DIAGNOSIS — Z5111 Encounter for antineoplastic chemotherapy: Secondary | ICD-10-CM

## 2021-07-07 LAB — CBC WITH DIFFERENTIAL (CANCER CENTER ONLY)
Abs Immature Granulocytes: 0.06 10*3/uL (ref 0.00–0.07)
Basophils Absolute: 0 10*3/uL (ref 0.0–0.1)
Basophils Relative: 0 %
Eosinophils Absolute: 0.1 10*3/uL (ref 0.0–0.5)
Eosinophils Relative: 1 %
HCT: 41.4 % (ref 39.0–52.0)
Hemoglobin: 13.9 g/dL (ref 13.0–17.0)
Immature Granulocytes: 1 %
Lymphocytes Relative: 8 %
Lymphs Abs: 0.7 10*3/uL (ref 0.7–4.0)
MCH: 32.6 pg (ref 26.0–34.0)
MCHC: 33.6 g/dL (ref 30.0–36.0)
MCV: 97 fL (ref 80.0–100.0)
Monocytes Absolute: 0.6 10*3/uL (ref 0.1–1.0)
Monocytes Relative: 7 %
Neutro Abs: 7.1 10*3/uL (ref 1.7–7.7)
Neutrophils Relative %: 83 %
Platelet Count: 185 10*3/uL (ref 150–400)
RBC: 4.27 MIL/uL (ref 4.22–5.81)
RDW: 16.2 % — ABNORMAL HIGH (ref 11.5–15.5)
WBC Count: 8.6 10*3/uL (ref 4.0–10.5)
nRBC: 0 % (ref 0.0–0.2)

## 2021-07-07 LAB — CMP (CANCER CENTER ONLY)
ALT: 31 U/L (ref 0–44)
AST: 20 U/L (ref 15–41)
Albumin: 3.9 g/dL (ref 3.5–5.0)
Alkaline Phosphatase: 68 U/L (ref 38–126)
Anion gap: 9 (ref 5–15)
BUN: 20 mg/dL (ref 8–23)
CO2: 23 mmol/L (ref 22–32)
Calcium: 9.4 mg/dL (ref 8.9–10.3)
Chloride: 107 mmol/L (ref 98–111)
Creatinine: 0.95 mg/dL (ref 0.61–1.24)
GFR, Estimated: >60 mL/min (ref >60)
Glucose, Bld: 148 mg/dL — ABNORMAL HIGH (ref 70–99)
Potassium: 4 mmol/L (ref 3.5–5.1)
Sodium: 139 mmol/L (ref 135–145)
Total Bilirubin: 0.5 mg/dL (ref 0.3–1.2)
Total Protein: 6.6 g/dL (ref 6.5–8.1)

## 2021-07-07 LAB — TSH: TSH: 2.704 u[IU]/mL (ref 0.350–4.500)

## 2021-07-07 MED ORDER — SODIUM CHLORIDE 0.9 % IV SOLN
500.0000 mg/m2 | Freq: Once | INTRAVENOUS | Status: AC
  Administered 2021-07-07: 1000 mg via INTRAVENOUS
  Filled 2021-07-07: qty 40

## 2021-07-07 MED ORDER — SODIUM CHLORIDE 0.9 % IV SOLN: Freq: Once | INTRAVENOUS | Status: AC

## 2021-07-07 MED ORDER — PROCHLORPERAZINE MALEATE 10 MG PO TABS
10.0000 mg | ORAL_TABLET | Freq: Once | ORAL | Status: AC
  Administered 2021-07-07: 10 mg via ORAL

## 2021-07-07 NOTE — Patient Instructions (Signed)
[facility]  Discharge Instructions: Thank you for choosing [facility] to provide your oncology and hematology care.   If you have a lab appointment with the [facility], please go directly to the [facility] and check in at the registration area.   Wear comfortable clothing and clothing appropriate for easy access to any Portacath or PICC line.   We strive to give you quality time with your provider. You may need to reschedule your appointment if you arrive late (15 or more minutes).  Arriving late affects you and other patients whose appointments are after yours.  Also, if you miss three or more appointments without notifying the office, you may be dismissed from the clinic at the provider's discretion.      For prescription refill requests, have your pharmacy contact our office and allow 72 hours for refills to be completed.    Today you received the following chemotherapy and/or immunotherapy agents: Pemetrexed.       To help prevent nausea and vomiting after your treatment, we encourage you to take your nausea medication as directed.  BELOW ARE SYMPTOMS THAT SHOULD BE REPORTED IMMEDIATELY: *FEVER GREATER THAN 100.4 F (38 C) OR HIGHER *CHILLS OR SWEATING *NAUSEA AND VOMITING THAT IS NOT CONTROLLED WITH YOUR NAUSEA MEDICATION *UNUSUAL SHORTNESS OF BREATH *UNUSUAL BRUISING OR BLEEDING *URINARY PROBLEMS (pain or burning when urinating, or frequent urination) *BOWEL PROBLEMS (unusual diarrhea, constipation, pain near the anus) TENDERNESS IN MOUTH AND THROAT WITH OR WITHOUT PRESENCE OF ULCERS (sore throat, sores in mouth, or a toothache) UNUSUAL RASH, SWELLING OR PAIN  UNUSUAL VAGINAL DISCHARGE OR ITCHING   Items with * indicate a potential emergency and should be followed up as soon as possible or go to the Emergency Department if any problems should occur.  Please show the CHEMOTHERAPY ALERT CARD or IMMUNOTHERAPY ALERT CARD at check-in  to the Emergency Department and triage nurse.  Should you have questions after your visit or need to cancel or reschedule your appointment, please contact [facility]  Dept: [phone]  and follow the prompts.  Office hours are 8:00 a.m. to 4:30 p.m. Monday - Friday. Please note that voicemails left after 4:00 p.m. may not be returned until the following business day.  We are closed weekends and major holidays. You have access to a nurse at all times for urgent questions. Please call the main number to the clinic Dept: [phone] and follow the prompts.   For any non-urgent questions, you may also contact your provider using MyChart. We now offer e-Visits for anyone 16 and older to request care online for non-urgent symptoms. For details visit [email or web address].   Also download the MyChart app! Go to the app store, search "MyChart", open the app, select [facility], and log in with your MyChart username and password.  Due to Covid, a mask is required upon entering the hospital/clinic. If you do not have a mask, one will be given to you upon arrival. For doctor visits, patients may have 1 support person aged 3 or older with them. For treatment visits, patients cannot have anyone with them due to current Covid guidelines and our immunocompromised population.

## 2021-07-28 ENCOUNTER — Inpatient Hospital Stay (HOSPITAL_BASED_OUTPATIENT_CLINIC_OR_DEPARTMENT_OTHER): Payer: No Typology Code available for payment source | Admitting: Internal Medicine

## 2021-07-28 ENCOUNTER — Encounter: Payer: Self-pay | Admitting: Internal Medicine

## 2021-07-28 ENCOUNTER — Inpatient Hospital Stay: Payer: No Typology Code available for payment source

## 2021-07-28 ENCOUNTER — Encounter: Payer: Self-pay | Admitting: Physician Assistant

## 2021-07-28 ENCOUNTER — Other Ambulatory Visit: Payer: Self-pay

## 2021-07-28 VITALS — BP 102/74 | HR 97 | Temp 96.0°F | Resp 17 | Wt 185.2 lb

## 2021-07-28 DIAGNOSIS — C3490 Malignant neoplasm of unspecified part of unspecified bronchus or lung: Secondary | ICD-10-CM | POA: Diagnosis not present

## 2021-07-28 DIAGNOSIS — C3491 Malignant neoplasm of unspecified part of right bronchus or lung: Secondary | ICD-10-CM

## 2021-07-28 DIAGNOSIS — Z5111 Encounter for antineoplastic chemotherapy: Secondary | ICD-10-CM | POA: Diagnosis not present

## 2021-07-28 LAB — CMP (CANCER CENTER ONLY)
ALT: 21 U/L (ref 0–44)
AST: 22 U/L (ref 15–41)
Albumin: 4 g/dL (ref 3.5–5.0)
Alkaline Phosphatase: 100 U/L (ref 38–126)
Anion gap: 7 (ref 5–15)
BUN: 14 mg/dL (ref 8–23)
CO2: 27 mmol/L (ref 22–32)
Calcium: 9.6 mg/dL (ref 8.9–10.3)
Chloride: 104 mmol/L (ref 98–111)
Creatinine: 1.09 mg/dL (ref 0.61–1.24)
GFR, Estimated: >60 mL/min (ref >60)
Glucose, Bld: 91 mg/dL (ref 70–99)
Potassium: 4.5 mmol/L (ref 3.5–5.1)
Sodium: 138 mmol/L (ref 135–145)
Total Bilirubin: 0.6 mg/dL (ref 0.3–1.2)
Total Protein: 7.3 g/dL (ref 6.5–8.1)

## 2021-07-28 LAB — CBC WITH DIFFERENTIAL (CANCER CENTER ONLY)
Abs Immature Granulocytes: 0.04 10*3/uL (ref 0.00–0.07)
Basophils Absolute: 0 10*3/uL (ref 0.0–0.1)
Basophils Relative: 1 %
Eosinophils Absolute: 0.1 10*3/uL (ref 0.0–0.5)
Eosinophils Relative: 1 %
HCT: 44 % (ref 39.0–52.0)
Hemoglobin: 15 g/dL (ref 13.0–17.0)
Immature Granulocytes: 1 %
Lymphocytes Relative: 16 %
Lymphs Abs: 0.9 10*3/uL (ref 0.7–4.0)
MCH: 32.8 pg (ref 26.0–34.0)
MCHC: 34.1 g/dL (ref 30.0–36.0)
MCV: 96.1 fL (ref 80.0–100.0)
Monocytes Absolute: 0.7 10*3/uL (ref 0.1–1.0)
Monocytes Relative: 14 %
Neutro Abs: 3.6 10*3/uL (ref 1.7–7.7)
Neutrophils Relative %: 67 %
Platelet Count: 273 10*3/uL (ref 150–400)
RBC: 4.58 MIL/uL (ref 4.22–5.81)
RDW: 15.4 % (ref 11.5–15.5)
WBC Count: 5.3 10*3/uL (ref 4.0–10.5)
nRBC: 0 % (ref 0.0–0.2)

## 2021-07-28 LAB — TSH: TSH: 2.827 u[IU]/mL (ref 0.350–4.500)

## 2021-07-28 MED ORDER — CYANOCOBALAMIN 1000 MCG/ML IJ SOLN: 1000.0000 ug | Freq: Once | INTRAMUSCULAR | Status: DC

## 2021-07-28 MED ORDER — SODIUM CHLORIDE 0.9 % IV SOLN: Freq: Once | INTRAVENOUS | Status: AC

## 2021-07-28 MED ORDER — SODIUM CHLORIDE 0.9 % IV SOLN
500.0000 mg/m2 | Freq: Once | INTRAVENOUS | Status: AC
  Administered 2021-07-28: 1000 mg via INTRAVENOUS
  Filled 2021-07-28: qty 40

## 2021-07-28 MED ORDER — PROCHLORPERAZINE MALEATE 10 MG PO TABS
10.0000 mg | ORAL_TABLET | Freq: Once | ORAL | Status: AC
  Administered 2021-07-28: 10 mg via ORAL
  Filled 2021-07-28: qty 1

## 2021-08-12 NOTE — Progress Notes (Signed)
Philip [name]  Philip Pepper, MD [place] [address] [place] [address]  DIAGNOSIS:  1) recurrent non-small cell lung cancer initially diagnosed as stage IIIc (T4, N3, M0) non-small cell lung cancer, adenocarcinoma presented with large right hilar mass in addition to bilateral hilar and mediastinal lymphadenopathy diagnosed in December 2020.  The patient has evidence for disease recurrence in October 2022. 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  Started on Xarelto on March 17, 2020 and currently 20 mg p.o. daily.  PRIOR THERAPY: 1) Weekly concurrent chemoradiation with carboplatin for an AUC of 2 and paclitaxel 45 mg/m2. First dose starting 01/30/2019.   Status post 6 cycles. 2) Consolidation immunotherapy with Imfinzi 1500 mg IV every 4 weeks. First dose expected on 04/19/2019.  Status post 13 cycles. 3) disease recurrence in October 2022  CURRENT THERAPY: Systemic chemotherapy with carboplatin for AUC of 5, Alimta 500 Mg/M2 and Keytruda 200 Mg IV every 3 weeks.  First dose January 01, 2021.  Status post 11 cycles.  He had hypersensitivity reaction to carboplatin and this was discontinued after cycle #2.  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  INTERVAL HISTORY: Philip [name] 66 y.o. male returns to the clinic today for a follow-up visit.  The patient is currently undergoing single agent chemotherapy with Alimta.  Philip [name] was discontinued from the treatment plan due to possible pneumonitis.  He completed a high-dose prednisone taper about 1 month ago. He continues to [name] persistent swelling and weight gain despite being off prednisone.  He denies any recent fever or chills. He reports baseline night sweats, which is not new.  He denies any hemoptysis or chest pain.  He reports  baseline dyspnea on exertion with activity. He reports he has similar cough which is "not bad".  Denies any nausea, vomiting, diarrhea, or constipation.  Denies any headache or visual changes. He denies dysuria, hematuria, or back pain.  The patient recently had a restaging CT scan performed.  He is here today for evaluation to review his scan results before starting cycle #12.   MEDICAL HISTORY: Past Medical History:  Diagnosis Date   Hyperlipidemia    lung ca dx'd 12/2018   Pneumonia    Pulmonary embolus (HCC)    Tobacco abuse     ALLERGIES:  is allergic to carboplatin, clonazepam, and norco [hydrocodone-acetaminophen].  MEDICATIONS:  Current Outpatient Medications  Medication Sig Dispense Refill   albuterol (VENTOLIN HFA) 108 (90 Base) MCG/ACT inhaler Inhale 2 puffs into the lungs every 4 (four) hours as needed for wheezing or shortness of breath. 18 g 6   atorvastatin (LIPITOR) 20 MG tablet Take 1 tablet by mouth daily.     dextromethorphan-guaiFENesin (MUCINEX DM) 30-600 MG 12hr tablet Take 1 tablet by mouth 2 (two) times daily as needed for cough. 40 tablet 0   famotidine (PEPCID) 20 MG tablet TAKE 1 TABLET BY MOUTH TWICE A DAY 180 tablet 1   fexofenadine (ALLEGRA) 180 MG tablet Take 180 mg by mouth daily.     folic acid (FOLVITE) 1 MG tablet TAKE 1 TABLET BY MOUTH EVERY DAY 90 tablet 1   HYDROcodone-acetaminophen (NORCO) 5-325 MG tablet Take 1 tablet by mouth every 6 (six) hours as needed for moderate pain. 5 tablet 0   naphazoline-pheniramine (VISINE) 0.025-0.3 % ophthalmic solution 1 drop into both  eyes as needed for red eye     prochlorperazine (COMPAZINE) 10 MG tablet Take 1 tablet (10 mg total) by mouth every 6 (six) hours as needed for nausea or vomiting. 30 tablet 0   rivaroxaban (XARELTO) 20 MG TABS tablet Take 1 tablet (20 mg total) by mouth daily with supper. 30 tablet 11   Tiotropium Bromide-Olodaterol (STIOLTO RESPIMAT) 2.5-2.5 MCG/ACT AERS Inhale 2 puffs into the lungs  daily. 1 each 11   No current facility-administered medications for this visit.    SURGICAL HISTORY:  Past Surgical History:  Procedure Laterality Date   COLONOSCOPY     HAND SURGERY     VIDEO BRONCHOSCOPY WITH ENDOBRONCHIAL ULTRASOUND N/A 12/22/2018   Procedure: VIDEO BRONCHOSCOPY WITH ENDOBRONCHIAL ULTRASOUND;  Surgeon: Garner Nash, DO;  Location: [facility];  Service: Thoracic;  Laterality: N/A;   VIDEO BRONCHOSCOPY WITH ENDOBRONCHIAL ULTRASOUND N/A 01/09/2019   Procedure: VIDEO BRONCHOSCOPY WITH ENDOBRONCHIAL ULTRASOUND WITH FLUORO;  Surgeon: Garner Nash, DO;  Location: [facility];  Service: Thoracic;  Laterality: N/A;   VIDEO BRONCHOSCOPY WITH RADIAL ENDOBRONCHIAL ULTRASOUND N/A 12/22/2018   Procedure: RADIAL ENDOBRONCHIAL ULTRASOUND;  Surgeon: Garner Nash, DO;  Location: [facility];  Service: Thoracic;  Laterality: N/A;    REVIEW OF SYSTEMS:   Review of Systems  Constitutional: Positive for weight gain. Positive for increased appetite. Negative for chills and fever.  HENT: Negative for mouth sores, nosebleeds, sore throat and trouble swallowing.   Eyes: Negative for eye problems and icterus.  Respiratory: positive for baseline shortness of breath with exertion. Positive for cough. Negative for hemoptysis and wheezing.   Cardiovascular: Negative for chest pain and leg swelling.  Gastrointestinal: Negative for abdominal pain, constipation, diarrhea, nausea and vomiting.  Genitourinary: Negative for bladder incontinence, difficulty urinating, dysuria, frequency and hematuria.   Musculoskeletal: Negative for back pain, gait problem, neck pain and neck stiffness.  Skin: Negative for itching and rash.  Neurological: Negative for dizziness, extremity weakness, gait problem, headaches, light-headedness and seizures.  Hematological: Negative for adenopathy. Does not bruise/bleed easily.  Psychiatric/Behavioral: Negative for confusion, depression and sleep disturbance. The patient is not  nervous/anxious.    PHYSICAL EXAMINATION:  There were no vitals taken for this visit.  ECOG PERFORMANCE STATUS: 1  Physical Exam  Constitutional: Oriented to person, place, and time and well-developed, well-nourished, and in no distress.  HENT:  Head: Normocephalic and atraumatic.  Mouth/Throat: Oropharynx is clear and moist. No oropharyngeal exudate.  Eyes: Conjunctivae are normal. Right eye exhibits no discharge. Left eye exhibits no discharge. No scleral icterus.  Neck: Normal range of motion. Neck supple.  Cardiovascular: Normal rate, regular rhythm, normal heart sounds and intact distal pulses.   Pulmonary/Chest: Effort normal and breath sounds normal. No respiratory distress. No wheezes. No rales.  Abdominal: Soft. Bowel sounds are normal. Exhibits no distension and no mass. There is no tenderness.  Musculoskeletal: Normal range of motion. Exhibits no edema.  Lymphadenopathy:    No cervical adenopathy.  Neurological: Alert and oriented to person, place, and time. Exhibits normal muscle tone. Gait normal. Coordination normal.  Skin: Skin is warm and dry. No rash noted. Not diaphoretic. No erythema. No pallor.  Psychiatric: Mood, memory and judgment normal.  Vitals reviewed.    LABORATORY DATA: Lab Results  Component Value Date   WBC 5.9 08/18/2021   HGB 14.0 08/18/2021   HCT 41.5 08/18/2021   MCV 93.9 08/18/2021   PLT 302 08/18/2021      Chemistry  Component Value Date/Time   NA 139 08/18/2021 1107   K 4.1 08/18/2021 1107   CL 107 08/18/2021 1107   CO2 26 08/18/2021 1107   BUN 12 08/18/2021 1107   CREATININE 1.00 08/18/2021 1107      Component Value Date/Time   CALCIUM 9.4 08/18/2021 1107   ALKPHOS 106 08/18/2021 1107   AST 23 08/18/2021 1107   ALT 19 08/18/2021 1107   BILITOT 0.5 08/18/2021 1107       RADIOGRAPHIC STUDIES:  CT Chest W Contrast  Result Date: 08/16/2021 CLINICAL DATA:  Non-small cell lung cancer restaging, patient reports leg  swelling and bloating * Tracking Code: BO * EXAM: CT CHEST, ABDOMEN, AND PELVIS WITH CONTRAST TECHNIQUE: Multidetector CT imaging of the chest, abdomen and pelvis was performed following the standard protocol during bolus administration of intravenous contrast. RADIATION DOSE REDUCTION: This exam was performed according to the departmental dose-optimization program which includes automated exposure control, adjustment of the mA and/or kV according to patient size and/or use of iterative reconstruction technique. CONTRAST:  183mL OMNIPAQUE IOHEXOL 300 MG/ML SOLN, additional oral enteric contrast COMPARISON:  06/12/2021 FINDINGS: CT CHEST FINDINGS Cardiovascular: Scattered aortic atherosclerosis. Normal heart size. Left and right coronary artery calcifications. No pericardial effusion. Unchanged, chronic occlusion of the right interlobar and more distal pulmonary arteries (series 2, image 28). Mediastinum/Nodes: Unchanged prominent subcentimeter mediastinal lymph nodes. Saber sheath trachea (series 2, image 15). Thyroid gland and esophagus demonstrate no significant findings. Lungs/Pleura: Unchanged post treatment/post radiation appearance of the chest, with dense bilateral perihilar consolidation and fibrosis. Previously seen peripheral ground-glass airspace opacity throughout the lungs is almost completely resolved (series 6, image 31, 89). Unchanged trace, loculated right pleural effusion. Moderate to severe underlying centrilobular and paraseptal emphysema. Unchanged elevation of the right hemidiaphragm. Musculoskeletal: No chest wall mass or suspicious osseous lesions identified. CT ABDOMEN PELVIS FINDINGS Hepatobiliary: No solid liver abnormality is seen. No gallstones, gallbladder wall thickening, or biliary dilatation. Pancreas: Unremarkable. No pancreatic ductal dilatation or surrounding inflammatory changes. Spleen: Normal in size without significant abnormality. Adrenals/Urinary Tract: Stable, definitively  benign fat containing left adrenal adenoma, not previously FDG avid (series 2, image 48). Moderate right hydronephrosis, new compared to prior examination, with abrupt caliber change at the ureteropelvic junction (series 7, image 18, series 4, image 84). Stomach/Bowel: Stomach is within normal limits. Appendix appears normal. No evidence of bowel wall thickening, distention, or inflammatory changes. Vascular/Lymphatic: Aortic atherosclerosis. No enlarged abdominal or pelvic lymph nodes. Reproductive: No mass or other abnormality. Other: No abdominal wall hernia or abnormality. No ascites. Musculoskeletal: No acute osseous findings. IMPRESSION: 1. Unchanged post treatment/post radiation appearance of the chest, with dense bilateral perihilar consolidation and fibrosis. Unchanged trace, loculated right pleural effusion. 2. Unchanged prominent mediastinal lymph nodes. 3. Previously seen peripheral ground-glass airspace opacity throughout the lungs is almost completely resolved, consistent with resolution of nonspecific infection or inflammation. 4. No evidence of lymphadenopathy or metastatic disease in the abdomen or pelvis. 5. Moderate right hydronephrosis, new compared to prior examination, with abrupt caliber change at the ureteropelvic junction. Findings suggest ureteropelvic junction stricture. Functional significance may be further assessed by nuclear scintigraphic Lasix renogram if desired. 6. Emphysema. 7. Coronary artery disease. Aortic Atherosclerosis (ICD10-I70.0). Electronically Signed   By: Delanna Ahmadi M.D.   On: 08/16/2021 14:26   CT Abdomen Pelvis W Contrast  Result Date: 08/16/2021 CLINICAL DATA:  Non-small cell lung cancer restaging, patient reports leg swelling and bloating * Tracking Code: BO * EXAM: CT CHEST, ABDOMEN, AND  PELVIS WITH CONTRAST TECHNIQUE: Multidetector CT imaging of the chest, abdomen and pelvis was performed following the standard protocol during bolus administration of  intravenous contrast. RADIATION DOSE REDUCTION: This exam was performed according to the departmental dose-optimization program which includes automated exposure control, adjustment of the mA and/or kV according to patient size and/or use of iterative reconstruction technique. CONTRAST:  124mL OMNIPAQUE IOHEXOL 300 MG/ML SOLN, additional oral enteric contrast COMPARISON:  06/12/2021 FINDINGS: CT CHEST FINDINGS Cardiovascular: Scattered aortic atherosclerosis. Normal heart size. Left and right coronary artery calcifications. No pericardial effusion. Unchanged, chronic occlusion of the right interlobar and more distal pulmonary arteries (series 2, image 28). Mediastinum/Nodes: Unchanged prominent subcentimeter mediastinal lymph nodes. Saber sheath trachea (series 2, image 15). Thyroid gland and esophagus demonstrate no significant findings. Lungs/Pleura: Unchanged post treatment/post radiation appearance of the chest, with dense bilateral perihilar consolidation and fibrosis. Previously seen peripheral ground-glass airspace opacity throughout the lungs is almost completely resolved (series 6, image 31, 89). Unchanged trace, loculated right pleural effusion. Moderate to severe underlying centrilobular and paraseptal emphysema. Unchanged elevation of the right hemidiaphragm. Musculoskeletal: No chest wall mass or suspicious osseous lesions identified. CT ABDOMEN PELVIS FINDINGS Hepatobiliary: No solid liver abnormality is seen. No gallstones, gallbladder wall thickening, or biliary dilatation. Pancreas: Unremarkable. No pancreatic ductal dilatation or surrounding inflammatory changes. Spleen: Normal in size without significant abnormality. Adrenals/Urinary Tract: Stable, definitively benign fat containing left adrenal adenoma, not previously FDG avid (series 2, image 48). Moderate right hydronephrosis, new compared to prior examination, with abrupt caliber change at the ureteropelvic junction (series 7, image 18, series  4, image 84). Stomach/Bowel: Stomach is within normal limits. Appendix appears normal. No evidence of bowel wall thickening, distention, or inflammatory changes. Vascular/Lymphatic: Aortic atherosclerosis. No enlarged abdominal or pelvic lymph nodes. Reproductive: No mass or other abnormality. Other: No abdominal wall hernia or abnormality. No ascites. Musculoskeletal: No acute osseous findings. IMPRESSION: 1. Unchanged post treatment/post radiation appearance of the chest, with dense bilateral perihilar consolidation and fibrosis. Unchanged trace, loculated right pleural effusion. 2. Unchanged prominent mediastinal lymph nodes. 3. Previously seen peripheral ground-glass airspace opacity throughout the lungs is almost completely resolved, consistent with resolution of nonspecific infection or inflammation. 4. No evidence of lymphadenopathy or metastatic disease in the abdomen or pelvis. 5. Moderate right hydronephrosis, new compared to prior examination, with abrupt caliber change at the ureteropelvic junction. Findings suggest ureteropelvic junction stricture. Functional significance may be further assessed by nuclear scintigraphic Lasix renogram if desired. 6. Emphysema. 7. Coronary artery disease. Aortic Atherosclerosis (ICD10-I70.0). Electronically Signed   By: Delanna Ahmadi M.D.   On: 08/16/2021 14:26     ASSESSMENT/PLAN:  This is a very pleasant 66 year old Caucasian male initially diagnosed with stage IIIc non-small cell lung cancer, adenocarcinoma.  He was diagnosed in 2020. Molecular studies negative for any actionable mutations.  He was found to have disease recurrence in October 2022. No actionable mutations by guardant 360.   The patient initially completed concurrent chemoradiation with weekly carboplatin for an AUC of 2 and paclitaxel 45 mg per metered squared.  He status post 6 cycles.  He tolerated this well with a partial response.   He then underwent consolidation immunotherapy with Imfinzi  1500 mg IV every 4 weeks.  He status post 13 cycles.   In September 2022, the patient scan showed an increase in the size of the soft tissue nodule in the right middle lobe adjacent to the post radiation changes which is highly suspicious for disease recurrence.  He  also has a soft tissue of the right hilum/azygoesophageal recess increased compared to remote prior studies.   The patient a PET scan that showed hypermetabolic nodule in the right lower lobe adjacent to the post radiation treatment site is increased consistent with lung cancer recurrence.  There is also a second hypermetabolic nodule in the right lower lobe above the diaphragm concerning for recurrence and hypermetabolic contralateral lymph node in the high left paratracheal mediastinum also concerning for metastatic adenopathy recurrence.   Dr. Julien Nordmann arranged for the patient to have guardant 360 molecular testing performed which showed no actionable mutations.   Therefore, Dr. Julien Nordmann recommend the patient start palliative systemic chemotherapy with carboplatin for an AUC of 5, Alimta 500 mg per metered square, Keytruda 200 mg IV every 3 weeks. He is status post 10 cycles of treatment. Starting from cycle #5, the patient started maintenance alimta 500 mg/m2 and Keytruda 200 mg IV every 3 weeks.  Philip [name] was discontinued starting from cycle #9 due to possible pneumonitis.   Patient recently had a restaging CT scan performed.  Dr. Julien Nordmann personally independently reviewed the scan discussed results with patient today.  The scan showed no evidence of disease progression but his scan did [name] some new moderate right hydronephrosis. The scan mentions, there is abrupt caliber change at the ureteropelvic junction. Findings suggest ureteropelvic junction stricture.  We will place an urgent referral to [facility] regarding this finding.  We are placing the referral due to concerns this could cause damage to the kidney in the future, which  could preclude our ability to use Alimta (chemotherapy) in the future.  The patient is asymptomatic at this time.  Labs reviewed. Creatinine WNL today.  Recommend that he proceed with cycle #12 today scheduled.  We will see him back for follow-up visit in 3 weeks for evaluation and repeat blood work before starting cycle #13.  He will continue taking Xarelto for his history of pulmonary embolism.   The patient was advised to call immediately if he has any concerning symptoms in the interval. The patient voices understanding of current disease status and treatment options and is in agreement with the current care plan. All questions were answered. The patient knows to call the clinic with any problems, questions or concerns. We can certainly see the patient much sooner if necessary  Orders Placed This Encounter  Procedures   Ambulatory referral to Urology    Referral Priority:   Urgent    Referral Type:   Consultation    Referral Reason:   Specialty Services Required    Requested Specialty:   Urology    Number of Visits Requested:   [place], [name] 08/18/21  ADDENDUM: Hematology/Oncology Attending: I had a face-to-face encounter with the patient today.  I reviewed his record, lab, scan and recommended his care plan.  This is a very pleasant 66 years old white male with recurrent non-small cell lung cancer that was initially diagnosed as stage IIIc in December 2020 status post a course of concurrent chemoradiation followed by consolidation treatment with immunotherapy for 1 year.  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  He was treated with tapered dose of prednisone and  continues to  have some puffiness and swelling from the previous treatment.  He tolerated the last cycle of his treatment with Alimta fairly well. The patient had repeat CT scan of the chest, abdomen and pelvis performed recently.  I personally and independently reviewed the scan images and discussed the result with the patient today. His scan showed no clear evidence for disease progression but there was evidence for right moderate hydronephrosis of unclear etiology but there is some questionable stricture in the vesicoureteral junction.  He is feeling fine with no worsening serum creatinine. I recommended for the patient to continue his current maintenance treatment and he will proceed with cycle #12 of his treatment with Alimta today as planned. For the moderate hydronephrosis, we will refer the patient to urology for further evaluation and management of his condition. The patient will come back for follow-up visit in 3 weeks for evaluation before the next cycle of his treatment. He was advised to call immediately if he has any other concerning symptoms in the interval. The total time spent in the appointment was 30 minutes. Disclaimer: This [name] was dictated with voice recognition software. Similar sounding words can inadvertently be transcribed and may be missed upon review. Eilleen Kempf, MD

## 2021-08-15 ENCOUNTER — Ambulatory Visit (HOSPITAL_COMMUNITY)
Admission: RE | Admit: 2021-08-15 | Discharge: 2021-08-15 | Disposition: A | Discharge status: Home / Self Care | Payer: No Typology Code available for payment source | Source: Ambulatory Visit | Attending: Internal Medicine | Admitting: Internal Medicine

## 2021-08-15 DIAGNOSIS — C3490 Malignant neoplasm of unspecified part of unspecified bronchus or lung: Secondary | ICD-10-CM | POA: Diagnosis present

## 2021-08-15 MED ORDER — IOHEXOL 300 MG/ML  SOLN
100.0000 mL | Freq: Once | INTRAMUSCULAR | Status: AC | PRN
  Administered 2021-08-15: 100 mL via INTRAVENOUS

## 2021-08-15 MED ORDER — IOHEXOL 9 MG/ML PO SOLN
500.0000 mL | ORAL | Status: AC
  Administered 2021-08-15 (×2): 500 mL via ORAL

## 2021-08-15 MED ORDER — IOHEXOL 9 MG/ML PO SOLN
ORAL | Status: AC
  Filled 2021-08-15: qty 1000

## 2021-08-15 MED ORDER — SODIUM CHLORIDE (PF) 0.9 % IJ SOLN
INTRAMUSCULAR | Status: AC
  Filled 2021-08-15: qty 50

## 2021-08-18 ENCOUNTER — Inpatient Hospital Stay: Payer: No Typology Code available for payment source | Attending: Internal Medicine | Admitting: Physician Assistant

## 2021-08-18 ENCOUNTER — Inpatient Hospital Stay: Payer: No Typology Code available for payment source

## 2021-08-18 ENCOUNTER — Other Ambulatory Visit: Payer: Self-pay

## 2021-08-18 VITALS — BP 112/87 | HR 90 | Temp 97.6°F | Resp 18 | Wt 185.0 lb

## 2021-08-18 DIAGNOSIS — C3490 Malignant neoplasm of unspecified part of unspecified bronchus or lung: Secondary | ICD-10-CM

## 2021-08-18 DIAGNOSIS — Z79899 Other long term (current) drug therapy: Secondary | ICD-10-CM | POA: Diagnosis not present

## 2021-08-18 DIAGNOSIS — Z7901 Long term (current) use of anticoagulants: Secondary | ICD-10-CM | POA: Insufficient documentation

## 2021-08-18 DIAGNOSIS — Z86711 Personal history of pulmonary embolism: Secondary | ICD-10-CM | POA: Diagnosis not present

## 2021-08-18 DIAGNOSIS — C3491 Malignant neoplasm of unspecified part of right bronchus or lung: Secondary | ICD-10-CM

## 2021-08-18 DIAGNOSIS — J90 Pleural effusion, not elsewhere classified: Secondary | ICD-10-CM | POA: Diagnosis not present

## 2021-08-18 DIAGNOSIS — N1330 Unspecified hydronephrosis: Secondary | ICD-10-CM | POA: Insufficient documentation

## 2021-08-18 DIAGNOSIS — Z5111 Encounter for antineoplastic chemotherapy: Secondary | ICD-10-CM | POA: Diagnosis not present

## 2021-08-18 DIAGNOSIS — J432 Centrilobular emphysema: Secondary | ICD-10-CM | POA: Insufficient documentation

## 2021-08-18 LAB — CBC WITH DIFFERENTIAL (CANCER CENTER ONLY)
Abs Immature Granulocytes: 0.02 10*3/uL (ref 0.00–0.07)
Basophils Absolute: 0.1 10*3/uL (ref 0.0–0.1)
Basophils Relative: 1 %
Eosinophils Absolute: 0.1 10*3/uL (ref 0.0–0.5)
Eosinophils Relative: 1 %
HCT: 41.5 % (ref 39.0–52.0)
Hemoglobin: 14 g/dL (ref 13.0–17.0)
Immature Granulocytes: 0 %
Lymphocytes Relative: 18 %
Lymphs Abs: 1 10*3/uL (ref 0.7–4.0)
MCH: 31.7 pg (ref 26.0–34.0)
MCHC: 33.7 g/dL (ref 30.0–36.0)
MCV: 93.9 fL (ref 80.0–100.0)
Monocytes Absolute: 0.8 10*3/uL (ref 0.1–1.0)
Monocytes Relative: 13 %
Neutro Abs: 3.9 10*3/uL (ref 1.7–7.7)
Neutrophils Relative %: 67 %
Platelet Count: 302 10*3/uL (ref 150–400)
RBC: 4.42 MIL/uL (ref 4.22–5.81)
RDW: 15.1 % (ref 11.5–15.5)
WBC Count: 5.9 10*3/uL (ref 4.0–10.5)
nRBC: 0 % (ref 0.0–0.2)

## 2021-08-18 LAB — CMP (CANCER CENTER ONLY)
ALT: 19 U/L (ref 0–44)
AST: 23 U/L (ref 15–41)
Albumin: 3.8 g/dL (ref 3.5–5.0)
Alkaline Phosphatase: 106 U/L (ref 38–126)
Anion gap: 6 (ref 5–15)
BUN: 12 mg/dL (ref 8–23)
CO2: 26 mmol/L (ref 22–32)
Calcium: 9.4 mg/dL (ref 8.9–10.3)
Chloride: 107 mmol/L (ref 98–111)
Creatinine: 1 mg/dL (ref 0.61–1.24)
GFR, Estimated: >60 mL/min (ref >60)
Glucose, Bld: 104 mg/dL — ABNORMAL HIGH (ref 70–99)
Potassium: 4.1 mmol/L (ref 3.5–5.1)
Sodium: 139 mmol/L (ref 135–145)
Total Bilirubin: 0.5 mg/dL (ref 0.3–1.2)
Total Protein: 7.2 g/dL (ref 6.5–8.1)

## 2021-08-18 LAB — TSH: TSH: 4.196 u[IU]/mL (ref 0.350–4.500)

## 2021-08-18 MED ORDER — PROCHLORPERAZINE MALEATE 10 MG PO TABS
10.0000 mg | ORAL_TABLET | Freq: Once | ORAL | Status: AC
  Administered 2021-08-18: 10 mg via ORAL
  Filled 2021-08-18: qty 1

## 2021-08-18 MED ORDER — SODIUM CHLORIDE 0.9 % IV SOLN
500.0000 mg/m2 | Freq: Once | INTRAVENOUS | Status: AC
  Administered 2021-08-18: 1000 mg via INTRAVENOUS
  Filled 2021-08-18: qty 40

## 2021-08-18 MED ORDER — CYANOCOBALAMIN 1000 MCG/ML IJ SOLN
1000.0000 ug | Freq: Once | INTRAMUSCULAR | Status: AC
  Administered 2021-08-18: 1000 ug via INTRAMUSCULAR
  Filled 2021-08-18: qty 1

## 2021-08-18 MED ORDER — SODIUM CHLORIDE 0.9 % IV SOLN: Freq: Once | INTRAVENOUS | Status: AC

## 2021-08-18 NOTE — Patient Instructions (Signed)
[facility]  Discharge Instructions: Thank you for choosing [facility] to provide your oncology and hematology care.   If you have a lab appointment with the [facility], please go directly to the [facility] and check in at the registration area.   Wear comfortable clothing and clothing appropriate for easy access to any Portacath or PICC line.   We strive to give you quality time with your provider. You may need to reschedule your appointment if you arrive late (15 or more minutes).  Arriving late affects you and other patients whose appointments are after yours.  Also, if you miss three or more appointments without notifying the office, you may be dismissed from the clinic at the provider's discretion.      For prescription refill requests, have your pharmacy contact our office and allow 72 hours for refills to be completed.    Today you received the following chemotherapy and/or immunotherapy agents: Pemetrexed.       To help prevent nausea and vomiting after your treatment, we encourage you to take your nausea medication as directed.  BELOW ARE SYMPTOMS THAT SHOULD BE REPORTED IMMEDIATELY: *FEVER GREATER THAN 100.4 F (38 C) OR HIGHER *CHILLS OR SWEATING *NAUSEA AND VOMITING THAT IS NOT CONTROLLED WITH YOUR NAUSEA MEDICATION *UNUSUAL SHORTNESS OF BREATH *UNUSUAL BRUISING OR BLEEDING *URINARY PROBLEMS (pain or burning when urinating, or frequent urination) *BOWEL PROBLEMS (unusual diarrhea, constipation, pain near the anus) TENDERNESS IN MOUTH AND THROAT WITH OR WITHOUT PRESENCE OF ULCERS (sore throat, sores in mouth, or a toothache) UNUSUAL RASH, SWELLING OR PAIN  UNUSUAL VAGINAL DISCHARGE OR ITCHING   Items with * indicate a potential emergency and should be followed up as soon as possible or go to the Emergency Department if any problems should occur.  Please show the CHEMOTHERAPY ALERT CARD or IMMUNOTHERAPY ALERT CARD at check-in  to the Emergency Department and triage nurse.  Should you have questions after your visit or need to cancel or reschedule your appointment, please contact [facility]  Dept: [phone]  and follow the prompts.  Office hours are 8:00 a.m. to 4:30 p.m. Monday - Friday. Please note that voicemails left after 4:00 p.m. may not be returned until the following business day.  We are closed weekends and major holidays. You have access to a nurse at all times for urgent questions. Please call the main number to the clinic Dept: [phone] and follow the prompts.   For any non-urgent questions, you may also contact your provider using MyChart. We now offer e-Visits for anyone 42 and older to request care online for non-urgent symptoms. For details visit [email or web address].   Also download the MyChart app! Go to the app store, search "MyChart", open the app, select [facility], and log in with your MyChart username and password.  Due to Covid, a mask is required upon entering the hospital/clinic. If you do not have a mask, one will be given to you upon arrival. For doctor visits, patients may have 1 support person aged 75 or older with them. For treatment visits, patients cannot have anyone with them due to current Covid guidelines and our immunocompromised population.

## 2021-08-18 NOTE — Progress Notes (Signed)
Faxed all pertinent information to [facility] for Urgent referral as per Cassie's instructions. Urgent referral   fax      [phone]. Regular referral fax      [phone].

## 2021-08-25 ENCOUNTER — Other Ambulatory Visit: Payer: Self-pay

## 2021-08-26 ENCOUNTER — Other Ambulatory Visit: Payer: Self-pay

## 2021-08-27 ENCOUNTER — Other Ambulatory Visit: Payer: Self-pay

## 2021-08-31 ENCOUNTER — Other Ambulatory Visit: Payer: Self-pay | Admitting: Internal Medicine

## 2021-09-01 ENCOUNTER — Encounter: Payer: Self-pay | Admitting: Internal Medicine

## 2021-09-01 ENCOUNTER — Telehealth: Payer: Self-pay

## 2021-09-01 ENCOUNTER — Encounter: Payer: Self-pay | Admitting: Physician Assistant

## 2021-09-01 NOTE — Telephone Encounter (Signed)
Spoke with pt to confirm that he has an appointment with [facility] on 09/02/21. Referral complete.

## 2021-09-02 ENCOUNTER — Other Ambulatory Visit: Payer: Self-pay | Admitting: Urology

## 2021-09-02 ENCOUNTER — Other Ambulatory Visit (HOSPITAL_COMMUNITY): Payer: Self-pay | Admitting: Urology

## 2021-09-02 DIAGNOSIS — N130 Hydronephrosis with ureteropelvic junction obstruction: Secondary | ICD-10-CM

## 2021-09-08 ENCOUNTER — Inpatient Hospital Stay (HOSPITAL_BASED_OUTPATIENT_CLINIC_OR_DEPARTMENT_OTHER): Payer: No Typology Code available for payment source | Admitting: Internal Medicine

## 2021-09-08 ENCOUNTER — Inpatient Hospital Stay: Payer: No Typology Code available for payment source

## 2021-09-08 ENCOUNTER — Inpatient Hospital Stay: Payer: No Typology Code available for payment source | Attending: Internal Medicine

## 2021-09-08 ENCOUNTER — Encounter: Payer: Self-pay | Admitting: Internal Medicine

## 2021-09-08 VITALS — BP 105/77 | HR 91 | Temp 98.2°F | Resp 16

## 2021-09-08 VITALS — BP 131/91 | HR 94 | Resp 16 | Wt 189.1 lb

## 2021-09-08 DIAGNOSIS — E785 Hyperlipidemia, unspecified: Secondary | ICD-10-CM | POA: Diagnosis not present

## 2021-09-08 DIAGNOSIS — Z86711 Personal history of pulmonary embolism: Secondary | ICD-10-CM | POA: Insufficient documentation

## 2021-09-08 DIAGNOSIS — C3401 Malignant neoplasm of right main bronchus: Secondary | ICD-10-CM | POA: Diagnosis not present

## 2021-09-08 DIAGNOSIS — I700 Atherosclerosis of aorta: Secondary | ICD-10-CM | POA: Insufficient documentation

## 2021-09-08 DIAGNOSIS — J90 Pleural effusion, not elsewhere classified: Secondary | ICD-10-CM | POA: Diagnosis not present

## 2021-09-08 DIAGNOSIS — J432 Centrilobular emphysema: Secondary | ICD-10-CM | POA: Diagnosis not present

## 2021-09-08 DIAGNOSIS — C3491 Malignant neoplasm of unspecified part of right bronchus or lung: Secondary | ICD-10-CM

## 2021-09-08 DIAGNOSIS — Z5111 Encounter for antineoplastic chemotherapy: Secondary | ICD-10-CM | POA: Insufficient documentation

## 2021-09-08 DIAGNOSIS — Z9221 Personal history of antineoplastic chemotherapy: Secondary | ICD-10-CM | POA: Diagnosis not present

## 2021-09-08 DIAGNOSIS — M25512 Pain in left shoulder: Secondary | ICD-10-CM | POA: Insufficient documentation

## 2021-09-08 DIAGNOSIS — C3490 Malignant neoplasm of unspecified part of unspecified bronchus or lung: Secondary | ICD-10-CM

## 2021-09-08 DIAGNOSIS — I2510 Atherosclerotic heart disease of native coronary artery without angina pectoris: Secondary | ICD-10-CM | POA: Insufficient documentation

## 2021-09-08 DIAGNOSIS — Z79899 Other long term (current) drug therapy: Secondary | ICD-10-CM | POA: Diagnosis not present

## 2021-09-08 DIAGNOSIS — R609 Edema, unspecified: Secondary | ICD-10-CM | POA: Insufficient documentation

## 2021-09-08 DIAGNOSIS — Z923 Personal history of irradiation: Secondary | ICD-10-CM | POA: Diagnosis not present

## 2021-09-08 LAB — CMP (CANCER CENTER ONLY)
ALT: 19 U/L (ref 0–44)
AST: 23 U/L (ref 15–41)
Albumin: 4 g/dL (ref 3.5–5.0)
Alkaline Phosphatase: 104 U/L (ref 38–126)
Anion gap: 6 (ref 5–15)
BUN: 18 mg/dL (ref 8–23)
CO2: 27 mmol/L (ref 22–32)
Calcium: 9.5 mg/dL (ref 8.9–10.3)
Chloride: 105 mmol/L (ref 98–111)
Creatinine: 1.06 mg/dL (ref 0.61–1.24)
GFR, Estimated: >60 mL/min (ref >60)
Glucose, Bld: 102 mg/dL — ABNORMAL HIGH (ref 70–99)
Potassium: 4.3 mmol/L (ref 3.5–5.1)
Sodium: 138 mmol/L (ref 135–145)
Total Bilirubin: 0.4 mg/dL (ref 0.3–1.2)
Total Protein: 7.8 g/dL (ref 6.5–8.1)

## 2021-09-08 LAB — CBC WITH DIFFERENTIAL (CANCER CENTER ONLY)
Abs Immature Granulocytes: 0.03 10*3/uL (ref 0.00–0.07)
Basophils Absolute: 0.1 10*3/uL (ref 0.0–0.1)
Basophils Relative: 1 %
Eosinophils Absolute: 0.1 10*3/uL (ref 0.0–0.5)
Eosinophils Relative: 2 %
HCT: 42.3 % (ref 39.0–52.0)
Hemoglobin: 14.5 g/dL (ref 13.0–17.0)
Immature Granulocytes: 1 %
Lymphocytes Relative: 17 %
Lymphs Abs: 1 10*3/uL (ref 0.7–4.0)
MCH: 32.4 pg (ref 26.0–34.0)
MCHC: 34.3 g/dL (ref 30.0–36.0)
MCV: 94.4 fL (ref 80.0–100.0)
Monocytes Absolute: 0.8 10*3/uL (ref 0.1–1.0)
Monocytes Relative: 13 %
Neutro Abs: 3.7 10*3/uL (ref 1.7–7.7)
Neutrophils Relative %: 66 %
Platelet Count: 291 10*3/uL (ref 150–400)
RBC: 4.48 MIL/uL (ref 4.22–5.81)
RDW: 14.7 % (ref 11.5–15.5)
WBC Count: 5.7 10*3/uL (ref 4.0–10.5)
nRBC: 0 % (ref 0.0–0.2)

## 2021-09-08 MED ORDER — SODIUM CHLORIDE 0.9 % IV SOLN: Freq: Once | INTRAVENOUS | Status: AC

## 2021-09-08 MED ORDER — SODIUM CHLORIDE 0.9 % IV SOLN
500.0000 mg/m2 | Freq: Once | INTRAVENOUS | Status: AC
  Administered 2021-09-08: 1000 mg via INTRAVENOUS
  Filled 2021-09-08: qty 40

## 2021-09-08 MED ORDER — PROCHLORPERAZINE MALEATE 10 MG PO TABS
10.0000 mg | ORAL_TABLET | Freq: Once | ORAL | Status: AC
  Administered 2021-09-08: 10 mg via ORAL
  Filled 2021-09-08: qty 1

## 2021-09-08 NOTE — Patient Instructions (Signed)
[facility]   Discharge Instructions: Thank you for choosing [facility] to provide your oncology and hematology care.   If you have a lab appointment with the [facility], please go directly to the [facility] and check in at the registration area.   Wear comfortable clothing and clothing appropriate for easy access to any Portacath or PICC line.   We strive to give you quality time with your provider. You may need to reschedule your appointment if you arrive late (15 or more minutes).  Arriving late affects you and other patients whose appointments are after yours.  Also, if you miss three or more appointments without notifying the office, you may be dismissed from the clinic at the provider's discretion.      For prescription refill requests, have your pharmacy contact our office and allow 72 hours for refills to be completed.    Today you received the following chemotherapy and/or immunotherapy agents: Pemetrexed (Alimta)      To help prevent nausea and vomiting after your treatment, we encourage you to take your nausea medication as directed.  BELOW ARE SYMPTOMS THAT SHOULD BE REPORTED IMMEDIATELY: *FEVER GREATER THAN 100.4 F (38 C) OR HIGHER *CHILLS OR SWEATING *NAUSEA AND VOMITING THAT IS NOT CONTROLLED WITH YOUR NAUSEA MEDICATION *UNUSUAL SHORTNESS OF BREATH *UNUSUAL BRUISING OR BLEEDING *URINARY PROBLEMS (pain or burning when urinating, or frequent urination) *BOWEL PROBLEMS (unusual diarrhea, constipation, pain near the anus) TENDERNESS IN MOUTH AND THROAT WITH OR WITHOUT PRESENCE OF ULCERS (sore throat, sores in mouth, or a toothache) UNUSUAL RASH, SWELLING OR PAIN  UNUSUAL VAGINAL DISCHARGE OR ITCHING   Items with * indicate a potential emergency and should be followed up as soon as possible or go to the Emergency Department if any problems should occur.  Please show the CHEMOTHERAPY ALERT CARD or IMMUNOTHERAPY ALERT CARD at  check-in to the Emergency Department and triage nurse.  Should you have questions after your visit or need to cancel or reschedule your appointment, please contact [facility]  Dept: [phone]  and follow the prompts.  Office hours are 8:00 a.m. to 4:30 p.m. Monday - Friday. Please note that voicemails left after 4:00 p.m. may not be returned until the following business day.  We are closed weekends and major holidays. You have access to a nurse at all times for urgent questions. Please call the main number to the clinic Dept: [phone] and follow the prompts.   For any non-urgent questions, you may also contact your provider using MyChart. We now offer e-Visits for anyone 58 and older to request care online for non-urgent symptoms. For details visit [email or web address].   Also download the MyChart app! Go to the app store, search "MyChart", open the app, select [facility], and log in with your MyChart username and password.  Masks are optional in the cancer centers. If you would like for your care team to wear a mask while they are taking care of you, please let them know. You may have one support person who is at least 66 years old accompany you for your appointments.

## 2021-09-08 NOTE — Progress Notes (Signed)
[facility] [phone]   [phone]  OFFICE PROGRESS NOTE  [name], MD [address] [address]  DIAGNOSIS:  1) recurrent non-small cell lung cancer initially diagnosed as stage IIIc (T4, N3, M0) non-small cell lung cancer, adenocarcinoma presented with large right hilar mass in addition to bilateral hilar and mediastinal lymphadenopathy diagnosed in December 2020.  The patient has evidence for disease recurrence in October 2022. 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  Started on Xarelto on March 17, 2020 and currently 20 mg p.o. daily.   PRIOR THERAPY:  1) Weekly concurrent chemoradiation with carboplatin for an AUC of 2 and paclitaxel 45 mg/m2. First dose starting 01/30/2019.   Status post 6 cycles. 2) Consolidation immunotherapy with Imfinzi 1500 mg IV every 4 weeks. First dose expected on 04/19/2019.  Status post 13 cycles. 3) disease recurrence in October 2022.   CURRENT THERAPY: Systemic chemotherapy with carboplatin for AUC of 5, Alimta 500 Mg/M2 and Keytruda 200 Mg IV every 3 weeks.  First dose January 01, 2021.  Status post 12 cycles.  He had hypersensitivity reaction to carboplatin and this was discontinued after cycle #2.  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  INTERVAL HISTORY: Philip [name] 66 y.o. male returns to the clinic today for follow-up visit.  The patient is feeling fine today with no concerning complaints except for some hearing deficit and he feels that his ears are stopped up.  He denied having any current chest pain, shortness of breath, cough or hemoptysis.  He has no nausea, vomiting, diarrhea or constipation.  He has no headache or visual changes.  He has no recent weight loss or night sweats.  He continues to tolerate  his treatment with Alimta fairly well.  He was seen by urology for the hydronephrosis and he is currently on observation.  The patient is here today for evaluation before starting cycle #13.  MEDICAL HISTORY: Past Medical History:  Diagnosis Date   Hyperlipidemia    lung ca dx'd 12/2018   Pneumonia    Pulmonary embolus (HCC)    Tobacco abuse     ALLERGIES:  is allergic to carboplatin, clonazepam, and norco [hydrocodone-acetaminophen].  MEDICATIONS:  Current Outpatient Medications  Medication Sig Dispense Refill   albuterol (VENTOLIN HFA) 108 (90 Base) MCG/ACT inhaler Inhale 2 puffs into the lungs every 4 (four) hours as needed for wheezing or shortness of breath. 18 g 6   atorvastatin (LIPITOR) 20 MG tablet Take 1 tablet by mouth daily.     dextromethorphan-guaiFENesin (MUCINEX DM) 30-600 MG 12hr tablet Take 1 tablet by mouth 2 (two) times daily as needed for cough. 40 tablet 0   famotidine (PEPCID) 20 MG tablet TAKE 1 TABLET BY MOUTH TWICE A DAY 180 tablet 1   fexofenadine (ALLEGRA) 180 MG tablet Take 180 mg by mouth daily.     folic acid (FOLVITE) 1 MG tablet TAKE 1 TABLET BY MOUTH EVERY DAY 90 tablet 1   HYDROcodone-acetaminophen (NORCO) 5-325 MG tablet Take 1 tablet by mouth every 6 (six) hours as needed for moderate pain. 5 tablet 0   naphazoline-pheniramine (VISINE) 0.025-0.3 % ophthalmic solution 1 drop into both eyes as needed for red eye     prochlorperazine (COMPAZINE) 10 MG tablet Take 1 tablet (10 mg total) by mouth  every 6 (six) hours as needed for nausea or vomiting. 30 tablet 0   rivaroxaban (XARELTO) 20 MG TABS tablet Take 1 tablet (20 mg total) by mouth daily with supper. 30 tablet 11   Tiotropium Bromide-Olodaterol (STIOLTO RESPIMAT) 2.5-2.5 MCG/ACT AERS Inhale 2 puffs into the lungs daily. 1 each 11   No current facility-administered medications for this visit.    SURGICAL HISTORY:  Past Surgical History:  Procedure Laterality Date   COLONOSCOPY     HAND SURGERY      VIDEO BRONCHOSCOPY WITH ENDOBRONCHIAL ULTRASOUND N/A 12/22/2018   Procedure: VIDEO BRONCHOSCOPY WITH ENDOBRONCHIAL ULTRASOUND;  Surgeon: Garner Nash, DO;  Location: [facility];  Service: Thoracic;  Laterality: N/A;   VIDEO BRONCHOSCOPY WITH ENDOBRONCHIAL ULTRASOUND N/A 01/09/2019   Procedure: VIDEO BRONCHOSCOPY WITH ENDOBRONCHIAL ULTRASOUND WITH FLUORO;  Surgeon: Garner Nash, DO;  Location: [facility];  Service: Thoracic;  Laterality: N/A;   VIDEO BRONCHOSCOPY WITH RADIAL ENDOBRONCHIAL ULTRASOUND N/A 12/22/2018   Procedure: RADIAL ENDOBRONCHIAL ULTRASOUND;  Surgeon: Garner Nash, DO;  Location: [facility];  Service: Thoracic;  Laterality: N/A;    REVIEW OF SYSTEMS:  A comprehensive review of systems was negative except for: Constitutional: positive for fatigue Ears, nose, mouth, throat, and face: positive for hearing loss Respiratory: positive for cough   PHYSICAL EXAMINATION: General appearance: alert, cooperative, fatigued, and no distress Head: Normocephalic, without obvious abnormality, atraumatic Neck: no adenopathy, no JVD, supple, symmetrical, trachea midline, and thyroid not enlarged, symmetric, no tenderness/mass/nodules Lymph nodes: Cervical, supraclavicular, and axillary nodes normal. Resp: clear to auscultation bilaterally Back: symmetric, no curvature. ROM normal. No CVA tenderness. Cardio: regular rate and rhythm, S1, S2 normal, no murmur, click, rub or gallop GI: soft, non-tender; bowel sounds normal; no masses,  no organomegaly Extremities: extremities normal, atraumatic, no cyanosis or edema  ECOG PERFORMANCE STATUS: 1 - Symptomatic but completely ambulatory  Blood pressure (!) 131/91, pulse 94, resp. rate 16, weight 189 lb 2 oz (85.8 kg), SpO2 95 %.  LABORATORY DATA: Lab Results  Component Value Date   WBC 5.7 09/08/2021   HGB 14.5 09/08/2021   HCT 42.3 09/08/2021   MCV 94.4 09/08/2021   PLT 291 09/08/2021      Chemistry      Component Value Date/Time   NA  139 08/18/2021 1107   K 4.1 08/18/2021 1107   CL 107 08/18/2021 1107   CO2 26 08/18/2021 1107   BUN 12 08/18/2021 1107   CREATININE 1.00 08/18/2021 1107      Component Value Date/Time   CALCIUM 9.4 08/18/2021 1107   ALKPHOS 106 08/18/2021 1107   AST 23 08/18/2021 1107   ALT 19 08/18/2021 1107   BILITOT 0.5 08/18/2021 1107       RADIOGRAPHIC STUDIES: CT Chest W Contrast  Result Date: 08/16/2021 CLINICAL DATA:  Non-small cell lung cancer restaging, patient reports leg swelling and bloating * Tracking Code: BO * EXAM: CT CHEST, ABDOMEN, AND PELVIS WITH CONTRAST TECHNIQUE: Multidetector CT imaging of the chest, abdomen and pelvis was performed following the standard protocol during bolus administration of intravenous contrast. RADIATION DOSE REDUCTION: This exam was performed according to the departmental dose-optimization program which includes automated exposure control, adjustment of the mA and/or kV according to patient size and/or use of iterative reconstruction technique. CONTRAST:  140mL OMNIPAQUE IOHEXOL 300 MG/ML SOLN, additional oral enteric contrast COMPARISON:  06/12/2021 FINDINGS: CT CHEST FINDINGS Cardiovascular: Scattered aortic atherosclerosis. Normal heart size. Left and right coronary artery calcifications. No pericardial effusion. Unchanged, chronic  occlusion of the right interlobar and more distal pulmonary arteries (series 2, image 28). Mediastinum/Nodes: Unchanged prominent subcentimeter mediastinal lymph nodes. Saber sheath trachea (series 2, image 15). Thyroid gland and esophagus demonstrate no significant findings. Lungs/Pleura: Unchanged post treatment/post radiation appearance of the chest, with dense bilateral perihilar consolidation and fibrosis. Previously seen peripheral ground-glass airspace opacity throughout the lungs is almost completely resolved (series 6, image 31, 89). Unchanged trace, loculated right pleural effusion. Moderate to severe underlying centrilobular  and paraseptal emphysema. Unchanged elevation of the right hemidiaphragm. Musculoskeletal: No chest wall mass or suspicious osseous lesions identified. CT ABDOMEN PELVIS FINDINGS Hepatobiliary: No solid liver abnormality is seen. No gallstones, gallbladder wall thickening, or biliary dilatation. Pancreas: Unremarkable. No pancreatic ductal dilatation or surrounding inflammatory changes. Spleen: Normal in size without significant abnormality. Adrenals/Urinary Tract: Stable, definitively benign fat containing left adrenal adenoma, not previously FDG avid (series 2, image 48). Moderate right hydronephrosis, new compared to prior examination, with abrupt caliber change at the ureteropelvic junction (series 7, image 18, series 4, image 84). Stomach/Bowel: Stomach is within normal limits. Appendix appears normal. No evidence of bowel wall thickening, distention, or inflammatory changes. Vascular/Lymphatic: Aortic atherosclerosis. No enlarged abdominal or pelvic lymph nodes. Reproductive: No mass or other abnormality. Other: No abdominal wall hernia or abnormality. No ascites. Musculoskeletal: No acute osseous findings. IMPRESSION: 1. Unchanged post treatment/post radiation appearance of the chest, with dense bilateral perihilar consolidation and fibrosis. Unchanged trace, loculated right pleural effusion. 2. Unchanged prominent mediastinal lymph nodes. 3. Previously seen peripheral ground-glass airspace opacity throughout the lungs is almost completely resolved, consistent with resolution of nonspecific infection or inflammation. 4. No evidence of lymphadenopathy or metastatic disease in the abdomen or pelvis. 5. Moderate right hydronephrosis, new compared to prior examination, with abrupt caliber change at the ureteropelvic junction. Findings suggest ureteropelvic junction stricture. Functional significance may be further assessed by nuclear scintigraphic Lasix renogram if desired. 6. Emphysema. 7. Coronary artery  disease. Aortic Atherosclerosis (ICD10-I70.0). Electronically Signed   By: Delanna Ahmadi M.D.   On: 08/16/2021 14:26   CT Abdomen Pelvis W Contrast  Result Date: 08/16/2021 CLINICAL DATA:  Non-small cell lung cancer restaging, patient reports leg swelling and bloating * Tracking Code: BO * EXAM: CT CHEST, ABDOMEN, AND PELVIS WITH CONTRAST TECHNIQUE: Multidetector CT imaging of the chest, abdomen and pelvis was performed following the standard protocol during bolus administration of intravenous contrast. RADIATION DOSE REDUCTION: This exam was performed according to the departmental dose-optimization program which includes automated exposure control, adjustment of the mA and/or kV according to patient size and/or use of iterative reconstruction technique. CONTRAST:  123mL OMNIPAQUE IOHEXOL 300 MG/ML SOLN, additional oral enteric contrast COMPARISON:  06/12/2021 FINDINGS: CT CHEST FINDINGS Cardiovascular: Scattered aortic atherosclerosis. Normal heart size. Left and right coronary artery calcifications. No pericardial effusion. Unchanged, chronic occlusion of the right interlobar and more distal pulmonary arteries (series 2, image 28). Mediastinum/Nodes: Unchanged prominent subcentimeter mediastinal lymph nodes. Saber sheath trachea (series 2, image 15). Thyroid gland and esophagus demonstrate no significant findings. Lungs/Pleura: Unchanged post treatment/post radiation appearance of the chest, with dense bilateral perihilar consolidation and fibrosis. Previously seen peripheral ground-glass airspace opacity throughout the lungs is almost completely resolved (series 6, image 31, 89). Unchanged trace, loculated right pleural effusion. Moderate to severe underlying centrilobular and paraseptal emphysema. Unchanged elevation of the right hemidiaphragm. Musculoskeletal: No chest wall mass or suspicious osseous lesions identified. CT ABDOMEN PELVIS FINDINGS Hepatobiliary: No solid liver abnormality is seen. No  gallstones, gallbladder wall thickening, or  biliary dilatation. Pancreas: Unremarkable. No pancreatic ductal dilatation or surrounding inflammatory changes. Spleen: Normal in size without significant abnormality. Adrenals/Urinary Tract: Stable, definitively benign fat containing left adrenal adenoma, not previously FDG avid (series 2, image 48). Moderate right hydronephrosis, new compared to prior examination, with abrupt caliber change at the ureteropelvic junction (series 7, image 18, series 4, image 84). Stomach/Bowel: Stomach is within normal limits. Appendix appears normal. No evidence of bowel wall thickening, distention, or inflammatory changes. Vascular/Lymphatic: Aortic atherosclerosis. No enlarged abdominal or pelvic lymph nodes. Reproductive: No mass or other abnormality. Other: No abdominal wall hernia or abnormality. No ascites. Musculoskeletal: No acute osseous findings. IMPRESSION: 1. Unchanged post treatment/post radiation appearance of the chest, with dense bilateral perihilar consolidation and fibrosis. Unchanged trace, loculated right pleural effusion. 2. Unchanged prominent mediastinal lymph nodes. 3. Previously seen peripheral ground-glass airspace opacity throughout the lungs is almost completely resolved, consistent with resolution of nonspecific infection or inflammation. 4. No evidence of lymphadenopathy or metastatic disease in the abdomen or pelvis. 5. Moderate right hydronephrosis, new compared to prior examination, with abrupt caliber change at the ureteropelvic junction. Findings suggest ureteropelvic junction stricture. Functional significance may be further assessed by nuclear scintigraphic Lasix renogram if desired. 6. Emphysema. 7. Coronary artery disease. Aortic Atherosclerosis (ICD10-I70.0). Electronically Signed   By: Delanna Ahmadi M.D.   On: 08/16/2021 14:26     ASSESSMENT AND PLAN: This is a very pleasant 66 years old white male with metastatic non-small cell lung cancer  that was initially diagnosed as stage IIIc non-small cell lung cancer, adenocarcinoma diagnosed in December 2020 The patient completed a course of concurrent chemoradiation with weekly carboplatin and paclitaxel status post 6 cycles and he tolerated his treatment well and has partial response. He underwent consolidation treatment with immunotherapy with Imfinzi 1500 mg IV every 4 weeks status post 13 cycles. He was on observation and feeling fine with no concerning complaints except for mild cough as well as the left shoulder pain. His scan showed increase in the size of the soft tissue nodule in the right middle lobe adjacent to the postradiation changes highly suspicious for recurrent disease.  There was also soft tissue of the right hilum/as ago esophageal recess unchanged at but increased compared to remote prior studies. The patient had a PET scan that showed hypermetabolic nodule in the right lower lobe adjacent to the post radiation treatment site increased in size and consistent with lung cancer recurrence.  There was second hypermetabolic nodule in the right lower lobe above the diaphragm concerning for recurrence and hypermetabolic contralateral lymph node in the high left paratracheal mediastinum also concerning for metastatic adenopathy recurrence. He started systemic chemotherapy with carboplatin for AUC of 5, Alimta 500 Mg/M2 and Keytruda 200 Mg IV every 3 weeks on January 01, 2021.   Carboplatin was discontinued with cycle #2 secondary to hypersensitivity reaction.  The patient is status post 12 cycles.  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  The patient is currently on single agent treatment with Alimta. The patient has been tolerating this treatment well with no concerning adverse effect except for mild fatigue. I recommended for him to proceed with cycle #13 today as planned. For the history of  pulmonary embolism, he will continue on Xarelto. I will see him back for follow-up visit in 3 weeks for evaluation before the next cycle of his treatment. The patient was advised to call immediately if he has any concerning symptoms  in the interval. The patient voices understanding of current disease status and treatment options and is in agreement with the current care plan. All questions were answered. The patient knows to call the clinic with any problems, questions or concerns. We can certainly see the patient much sooner if necessary.  Disclaimer: This note was dictated with voice recognition software. Similar sounding words can inadvertently be transcribed and may not be corrected upon review.

## 2021-09-09 ENCOUNTER — Other Ambulatory Visit: Payer: Self-pay

## 2021-09-09 ENCOUNTER — Encounter: Payer: Self-pay | Admitting: Internal Medicine

## 2021-09-09 ENCOUNTER — Encounter: Payer: Self-pay | Admitting: Physician Assistant

## 2021-09-17 ENCOUNTER — Encounter (HOSPITAL_COMMUNITY)
Admission: RE | Admit: 2021-09-17 | Discharge: 2021-09-17 | Disposition: A | Discharge status: Home / Self Care | Payer: No Typology Code available for payment source | Source: Ambulatory Visit | Attending: Urology | Admitting: Urology

## 2021-09-17 DIAGNOSIS — N130 Hydronephrosis with ureteropelvic junction obstruction: Secondary | ICD-10-CM | POA: Insufficient documentation

## 2021-09-17 MED ORDER — FUROSEMIDE 10 MG/ML IJ SOLN
INTRAMUSCULAR | Status: AC
  Filled 2021-09-17: qty 8

## 2021-09-17 MED ORDER — FUROSEMIDE 10 MG/ML IJ SOLN: 43.0000 mg | Freq: Once | INTRAMUSCULAR | Status: DC

## 2021-09-17 MED ORDER — FUROSEMIDE 10 MG/ML IJ SOLN
40.0000 mg | Freq: Once | INTRAMUSCULAR | Status: AC
Start: 2021-09-17 — End: 2021-09-17
  Administered 2021-09-17: 40 mg via INTRAVENOUS

## 2021-09-17 MED ORDER — TECHNETIUM TC 99M MERTIATIDE
5.3300 | Freq: Once | INTRAVENOUS | Status: AC
  Administered 2021-09-17: 5.33 via INTRAVENOUS

## 2021-09-22 ENCOUNTER — Other Ambulatory Visit: Payer: Self-pay

## 2021-09-22 NOTE — Progress Notes (Signed)
[facility] OFFICE PROGRESS NOTE  [name], MD [address] [place] [id number]  DIAGNOSIS: 1) recurrent non-small cell lung cancer initially diagnosed as stage IIIc (T4, N3, M0) non-small cell lung cancer, adenocarcinoma presented with large right hilar mass in addition to bilateral hilar and mediastinal lymphadenopathy diagnosed in December 2020.  The patient has evidence for disease recurrence in October 2022. 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  Started on Xarelto on March 17, 2020 and currently 20 mg p.o. daily.    PRIOR THERAPY: 1) Weekly concurrent chemoradiation with carboplatin for an AUC of 2 and paclitaxel 45 mg/m2. First dose starting 01/30/2019.   Status post 6 cycles. 2) Consolidation immunotherapy with Imfinzi 1500 mg IV every 4 weeks. First dose expected on 04/19/2019.  Status post 13 cycles. 3) disease recurrence in October 2022  CURRENT THERAPY: Systemic chemotherapy with carboplatin for AUC of 5, Alimta 500 Mg/M2 and Keytruda 200 Mg IV every 3 weeks.  First dose January 01, 2021.  Status post 13 cycles.  He had hypersensitivity reaction to carboplatin and this was discontinued after cycle #2.  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    INTERVAL HISTORY: Philip [name] 66 y.o. male returns to the clinic today for follow-up visit.  The patient is feeling fairly well today without any concerning complaints except he still has some puffiness in his face and ankles bilaterally.  Patient is currently on single agent chemotherapy with Alimta.  [name] was discontinued from the treatment plan due to possible pneumonitis for which he previously completed a high-dose prednisone taper. He reports his swelling is about the same. It comes and goes. He has compression stockings but  is not sure if he is able to use them. He thinks his swelling may be better in the morning after elevating his legs. He has some slight pitting in his feet. He states he has been drinking a lot of water recently and therefore is up a lot at night urinating. He denies dysuria. He is being seen by [facility]. His most recent scan noted incidental hydronephrosis. He is supposed to hear from them this week to schedule a stent placement. He continues to have tearing eyes which may be from his alimta. Today he denies any fever, chills, or recent night sweats.  He reports his similar baseline dyspnea exertion.  Denies any hemoptysis or chest pain.  He reports a similar stable cough.  Denies any nausea, vomiting, diarrhea, or constipation. Denies any headache or visual changes.  He is here today for evaluation and repeat blood work before starting cycle #14.    MEDICAL HISTORY: Past Medical History:  Diagnosis Date   Hyperlipidemia    lung ca dx'd 12/2018   Pneumonia    Pulmonary embolus (HCC)    Tobacco abuse     ALLERGIES:  is allergic to carboplatin, clonazepam, and norco [hydrocodone-acetaminophen].  MEDICATIONS:  Current Outpatient Medications  Medication Sig Dispense Refill   albuterol (VENTOLIN HFA) 108 (90 Base) MCG/ACT inhaler Inhale 2 puffs into the lungs every 4 (four) hours as needed for wheezing or shortness of breath. 18 g 6   atorvastatin (LIPITOR) 20 MG tablet Take 1 tablet by mouth daily.     dextromethorphan-guaiFENesin (MUCINEX DM) 30-600 MG 12hr tablet Take 1 tablet by mouth 2 (two) times daily as needed  for cough. (Patient not taking: Reported on 09/08/2021) 40 tablet 0   famotidine (PEPCID) 20 MG tablet TAKE 1 TABLET BY MOUTH TWICE A DAY 180 tablet 1   fexofenadine (ALLEGRA) 180 MG tablet Take 180 mg by mouth daily.     folic acid (FOLVITE) 1 MG tablet TAKE 1 TABLET BY MOUTH EVERY DAY 90 tablet 1   HYDROcodone-acetaminophen (NORCO) 5-325 MG tablet Take 1 tablet by mouth  every 6 (six) hours as needed for moderate pain. (Patient not taking: Reported on 09/08/2021) 5 tablet 0   Multiple Vitamins-Minerals (MULTI ADULT GUMMIES) CHEW      naphazoline-pheniramine (VISINE) 0.025-0.3 % ophthalmic solution 1 drop into both eyes as needed for red eye     prochlorperazine (COMPAZINE) 10 MG tablet Take 1 tablet (10 mg total) by mouth every 6 (six) hours as needed for nausea or vomiting. (Patient not taking: Reported on 09/08/2021) 30 tablet 0   rivaroxaban (XARELTO) 20 MG TABS tablet Take 1 tablet (20 mg total) by mouth daily with supper. 30 tablet 11   Tiotropium Bromide-Olodaterol (STIOLTO RESPIMAT) 2.5-2.5 MCG/ACT AERS Inhale 2 puffs into the lungs daily. 1 each 11   No current facility-administered medications for this visit.    SURGICAL HISTORY:  Past Surgical History:  Procedure Laterality Date   COLONOSCOPY     HAND SURGERY     VIDEO BRONCHOSCOPY WITH ENDOBRONCHIAL ULTRASOUND N/A 12/22/2018   Procedure: VIDEO BRONCHOSCOPY WITH ENDOBRONCHIAL ULTRASOUND;  Surgeon: Garner Nash, DO;  Location: [facility];  Service: Thoracic;  Laterality: N/A;   VIDEO BRONCHOSCOPY WITH ENDOBRONCHIAL ULTRASOUND N/A 01/09/2019   Procedure: VIDEO BRONCHOSCOPY WITH ENDOBRONCHIAL ULTRASOUND WITH FLUORO;  Surgeon: Garner Nash, DO;  Location: [facility];  Service: Thoracic;  Laterality: N/A;   VIDEO BRONCHOSCOPY WITH RADIAL ENDOBRONCHIAL ULTRASOUND N/A 12/22/2018   Procedure: RADIAL ENDOBRONCHIAL ULTRASOUND;  Surgeon: Garner Nash, DO;  Location: [facility];  Service: Thoracic;  Laterality: N/A;    REVIEW OF SYSTEMS:   Review of Systems  Constitutional: Positive for stable fatigue. Negative for appetite change, chills, fever and unexpected weight change.  HENT:  Positive for tearing. Negative for mouth sores, nosebleeds, sore throat and trouble swallowing.   Eyes: Negative for eye problems and icterus.  Respiratory: positive for baseline shortness of breath with exertion. Positive for baseline  cough. Negative for hemoptysis and wheezing.   Cardiovascular: Negative for chest pain. Positive for bilateral foot/ankle swelling. Gastrointestinal: Negative for abdominal pain, constipation, diarrhea, nausea and vomiting.  Genitourinary: Positive for frequent urination. Negative for bladder incontinence, difficulty urinating, dysuria, frequency and hematuria.   Musculoskeletal: Negative for back pain, gait problem, neck pain and neck stiffness.  Skin: Negative for itching and rash.  Neurological: Negative for dizziness, extremity weakness, gait problem, headaches, light-headedness and seizures.  Hematological: Negative for adenopathy. Does not bruise/bleed easily.  Psychiatric/Behavioral: Negative for confusion, depression and sleep disturbance. The patient is not nervous/anxious.     PHYSICAL EXAMINATION:  Blood pressure (!) 128/99, pulse 100, temperature (!) 97.5 F (36.4 C), temperature source Oral, resp. rate 18, height 5\' 6"  (1.676 m), weight 189 lb 11.2 oz (86 kg), SpO2 100 %.  ECOG PERFORMANCE STATUS: 1  Physical Exam  Constitutional: Oriented to person, place, and time and well-developed, well-nourished, and in no distress.  HENT:  Head: Normocephalic and atraumatic.  Mouth/Throat: Oropharynx is clear and moist. No oropharyngeal exudate.  Eyes: Conjunctivae are normal. Right eye exhibits no discharge. Left eye exhibits no discharge. No scleral icterus.  Neck: Normal  range of motion. Neck supple.  Cardiovascular: Normal rate, regular rhythm, normal heart sounds and intact distal pulses.   Pulmonary/Chest: Effort normal and breath sounds normal. No respiratory distress. No wheezes. No rales.  Abdominal: Soft. Bowel sounds are normal. Exhibits no distension and no mass. There is no tenderness.  Musculoskeletal: Normal range of motion. Bilateral mild pitting edema in ankles. No calf pain or erythema.  Lymphadenopathy:    No cervical adenopathy.  Neurological: Alert and oriented to  person, place, and time. Exhibits normal muscle tone. Gait normal. Coordination normal.  Skin: Skin is warm and dry. No rash noted. Not diaphoretic. No erythema. No pallor.  Psychiatric: Mood, memory and judgment normal.  Vitals reviewed.  LABORATORY DATA: Lab Results  Component Value Date   WBC 5.0 09/29/2021   HGB 14.1 09/29/2021   HCT 41.0 09/29/2021   MCV 93.6 09/29/2021   PLT 268 09/29/2021      Chemistry      Component Value Date/Time   NA 139 09/29/2021 1108   K 3.9 09/29/2021 1108   CL 106 09/29/2021 1108   CO2 26 09/29/2021 1108   BUN 14 09/29/2021 1108   CREATININE 0.98 09/29/2021 1108      Component Value Date/Time   CALCIUM 9.4 09/29/2021 1108   ALKPHOS 99 09/29/2021 1108   AST 24 09/29/2021 1108   ALT 21 09/29/2021 1108   BILITOT 0.6 09/29/2021 1108       RADIOGRAPHIC STUDIES:  NM Renal Imaging Flow W/Pharm  Result Date: 09/17/2021 CLINICAL DATA:  Abnormal CT, new RIGHT hydronephrosis, history of non-small cell lung cancer. EXAM: NUCLEAR MEDICINE RENAL SCAN WITH DIURETIC ADMINISTRATION TECHNIQUE: Radionuclide angiographic and sequential renal images were obtained after intravenous injection of radiopharmaceutical. Imaging was continued during slow intravenous injection of Lasix approximately 15 minutes after the start of the examination. RADIOPHARMACEUTICALS:  5.33 mCi Technetium-32m MAG3 IV Pharmaceutical: 40 mg IV Lasix IV COMPARISON:  CT abdomen and pelvis 08/15/2021 FINDINGS: Flow:  Prompt symmetric arterial flow to the kidneys. Left renogram: Normal uptake, concentration, and excretion of tracer by LEFT kidney. Good clearance of tracer before and continuing following Lasix administration. No abnormal tracer retention at conclusion of exam. Analysis of renogram curve demonstrates a normal time to peak activity of 6.2 minutes with fall to half maximum activity 9.5 minutes later. Right renogram: Normal uptake and concentration of tracer by RIGHT kidney. Excretion  of tracer into a dilated RIGHT renal collecting system. Poor tracer clearance prior to Lasix. Following diuretic administration, partial washout of tracer is seen from the dilated renal collecting system but significant tracer residual is seen at the conclusion of the exam. Analysis of the renogram curve demonstrates a delayed time to peak activity of 19.2 minutes with failure to fall to half maximum activity by the conclusion of the exam. Differential: Left kidney = 55 % Right kidney = 45 % T1/2 post Lasix : Left kidney = 10.0 min Right kidney = N/A min IMPRESSION: Normal LEFT renogram. Dilated RIGHT renal collecting system with evidence of impaired clearance of contrast despite diuretic administration consistent with urinary outflow obstruction. Electronically Signed   By: Lavonia Dana M.D.   On: 09/17/2021 15:27     ASSESSMENT/PLAN:  This is a very pleasant 66 year old Caucasian male initially diagnosed with stage IIIc non-small cell lung cancer, adenocarcinoma.  He was diagnosed in 2020. Molecular studies negative for any actionable mutations.  He was found to have disease recurrence in October 2022. No actionable mutations by guardant 360.  The patient initially completed concurrent chemoradiation with weekly carboplatin for an AUC of 2 and paclitaxel 45 mg per metered squared.  He status post 6 cycles.  He tolerated this well with a partial response.   He then underwent consolidation immunotherapy with Imfinzi 1500 mg IV every 4 weeks.  He status post 13 cycles.   In September 2022, the patient scan showed an increase in the size of the soft tissue nodule in the right middle lobe adjacent to the post radiation changes which is highly suspicious for disease recurrence.  He also has a soft tissue of the right hilum/azygoesophageal recess increased compared to remote prior studies.    The patient a PET scan that showed hypermetabolic nodule in the right lower lobe adjacent to the post radiation  treatment site is increased consistent with lung cancer recurrence.  There is also a second hypermetabolic nodule in the right lower lobe above the diaphragm concerning for recurrence and hypermetabolic contralateral lymph node in the high left paratracheal mediastinum also concerning for metastatic adenopathy recurrence.   Dr. Julien Nordmann arranged for the patient to have guardant 360 molecular testing performed which showed no actionable mutations.   Therefore, Dr. Julien Nordmann recommend the patient start palliative systemic chemotherapy with carboplatin for an AUC of 5, Alimta 500 mg per metered square, Keytruda 200 mg IV every 3 weeks. He is status post 13 cycles of treatment. Starting from cycle #5, the patient started maintenance alimta 500 mg/m2 and Keytruda 200 mg IV every 3 weeks.  [name] was discontinued starting from cycle #9 due to possible pneumonitis.     Labs were reviewed.  Recommend that he proceed with cycle #14 today scheduled.  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  The patient was referred to urology. He is expecting to hear from this soon about scheduling stent placement. His creatinine is WNL today.   I will arrange for restaging CT scan of the chest, abdomen, pelvis prior to starting his next cycle of treatment. I will order this WO contrast to avoid any agents that would potentially affect his renal function until his hydronephrosis and stent placement is completed.   Similarly, regarding his fluid retention, I would like to avoid lasix for now to avoid any agents that can affect his renal function. Advised to elevate his legs, use compression stockings, and avoid salty foods. He has mild pitting edema in his feet bilaterally without any concerning symptoms such as erythema, pain, or unilateral swelling. Of course, he was advised if he develops any of these symptoms to call the clinic for re-evaluation.    We will see him back for follow-up visit in 3 weeks for evaluation and repeat blood work before starting cycle #15.  He will continue on Xarelto for his history of pulmonary embolism.   The patient was advised to call immediately if he has any concerning symptoms in the interval. The patient voices understanding of current disease status and treatment options and is in agreement with the current care plan. All questions were answered. The patient knows to call the clinic with any problems, questions or concerns. We can certainly see the patient much sooner if necessary       Orders Placed This Encounter  Procedures   CT CHEST ABDOMEN PELVIS WO CONTRAST    Standing Status:   Future    Standing Expiration Date:   09/30/2022    Order Specific Question:  If indicated for the ordered procedure, I authorize the administration of contrast media per Radiology protocol    Answer:   Yes    Order Specific Question:   Preferred imaging location?    Answer:   [facility]    Order Specific Question:   Is Oral Contrast requested for this exam?    Answer:   Yes, Per Radiology protocol      The total time spent in the appointment was 20-29 minutes.   Sanyia Dini L Khylah Kendra, [name] 09/29/21

## 2021-09-23 ENCOUNTER — Telehealth: Payer: Self-pay | Admitting: Medical Oncology

## 2021-09-23 NOTE — Telephone Encounter (Signed)
I told pt  that his folic acid was refilled.

## 2021-09-29 ENCOUNTER — Other Ambulatory Visit: Payer: Self-pay

## 2021-09-29 ENCOUNTER — Inpatient Hospital Stay (HOSPITAL_BASED_OUTPATIENT_CLINIC_OR_DEPARTMENT_OTHER): Payer: No Typology Code available for payment source | Admitting: Physician Assistant

## 2021-09-29 ENCOUNTER — Inpatient Hospital Stay: Payer: No Typology Code available for payment source

## 2021-09-29 VITALS — BP 128/99 | HR 100 | Temp 97.5°F | Resp 18 | Ht 66.0 in | Wt 189.7 lb

## 2021-09-29 VITALS — BP 108/88 | HR 87 | Temp 98.0°F | Resp 18

## 2021-09-29 DIAGNOSIS — C3491 Malignant neoplasm of unspecified part of right bronchus or lung: Secondary | ICD-10-CM | POA: Diagnosis not present

## 2021-09-29 DIAGNOSIS — Z5111 Encounter for antineoplastic chemotherapy: Secondary | ICD-10-CM | POA: Diagnosis not present

## 2021-09-29 DIAGNOSIS — C3490 Malignant neoplasm of unspecified part of unspecified bronchus or lung: Secondary | ICD-10-CM | POA: Diagnosis not present

## 2021-09-29 DIAGNOSIS — N1330 Unspecified hydronephrosis: Secondary | ICD-10-CM | POA: Diagnosis not present

## 2021-09-29 LAB — CMP (CANCER CENTER ONLY)
ALT: 21 U/L (ref 0–44)
AST: 24 U/L (ref 15–41)
Albumin: 3.8 g/dL (ref 3.5–5.0)
Alkaline Phosphatase: 99 U/L (ref 38–126)
Anion gap: 7 (ref 5–15)
BUN: 14 mg/dL (ref 8–23)
CO2: 26 mmol/L (ref 22–32)
Calcium: 9.4 mg/dL (ref 8.9–10.3)
Chloride: 106 mmol/L (ref 98–111)
Creatinine: 0.98 mg/dL (ref 0.61–1.24)
GFR, Estimated: >60 mL/min (ref >60)
Glucose, Bld: 170 mg/dL — ABNORMAL HIGH (ref 70–99)
Potassium: 3.9 mmol/L (ref 3.5–5.1)
Sodium: 139 mmol/L (ref 135–145)
Total Bilirubin: 0.6 mg/dL (ref 0.3–1.2)
Total Protein: 7.2 g/dL (ref 6.5–8.1)

## 2021-09-29 LAB — CBC WITH DIFFERENTIAL (CANCER CENTER ONLY)
Abs Immature Granulocytes: 0.01 10*3/uL (ref 0.00–0.07)
Basophils Absolute: 0.1 10*3/uL (ref 0.0–0.1)
Basophils Relative: 1 %
Eosinophils Absolute: 0.1 10*3/uL (ref 0.0–0.5)
Eosinophils Relative: 1 %
HCT: 41 % (ref 39.0–52.0)
Hemoglobin: 14.1 g/dL (ref 13.0–17.0)
Immature Granulocytes: 0 %
Lymphocytes Relative: 15 %
Lymphs Abs: 0.8 10*3/uL (ref 0.7–4.0)
MCH: 32.2 pg (ref 26.0–34.0)
MCHC: 34.4 g/dL (ref 30.0–36.0)
MCV: 93.6 fL (ref 80.0–100.0)
Monocytes Absolute: 0.5 10*3/uL (ref 0.1–1.0)
Monocytes Relative: 9 %
Neutro Abs: 3.6 10*3/uL (ref 1.7–7.7)
Neutrophils Relative %: 74 %
Platelet Count: 268 10*3/uL (ref 150–400)
RBC: 4.38 MIL/uL (ref 4.22–5.81)
RDW: 14.2 % (ref 11.5–15.5)
WBC Count: 5 10*3/uL (ref 4.0–10.5)
nRBC: 0 % (ref 0.0–0.2)

## 2021-09-29 MED ORDER — SODIUM CHLORIDE 0.9 % IV SOLN
500.0000 mg/m2 | Freq: Once | INTRAVENOUS | Status: AC
  Administered 2021-09-29: 1000 mg via INTRAVENOUS
  Filled 2021-09-29: qty 40

## 2021-09-29 MED ORDER — SODIUM CHLORIDE 0.9 % IV SOLN: Freq: Once | INTRAVENOUS | Status: AC

## 2021-09-29 MED ORDER — PROCHLORPERAZINE MALEATE 10 MG PO TABS
10.0000 mg | ORAL_TABLET | Freq: Once | ORAL | Status: AC
  Administered 2021-09-29: 10 mg via ORAL
  Filled 2021-09-29: qty 1

## 2021-09-29 NOTE — Patient Instructions (Signed)
[facility]  Discharge Instructions: Thank you for choosing [facility] to provide your oncology and hematology care.   If you have a lab appointment with the [facility], please go directly to the [facility] and check in at the registration area.   Wear comfortable clothing and clothing appropriate for easy access to any Portacath or PICC line.   We strive to give you quality time with your provider. You may need to reschedule your appointment if you arrive late (15 or more minutes).  Arriving late affects you and other patients whose appointments are after yours.  Also, if you miss three or more appointments without notifying the office, you may be dismissed from the clinic at the provider's discretion.      For prescription refill requests, have your pharmacy contact our office and allow 72 hours for refills to be completed.    Today you received the following chemotherapy and/or immunotherapy agent: Pemetrexed (Alimta)   To help prevent nausea and vomiting after your treatment, we encourage you to take your nausea medication as directed.  BELOW ARE SYMPTOMS THAT SHOULD BE REPORTED IMMEDIATELY: *FEVER GREATER THAN 100.4 F (38 C) OR HIGHER *CHILLS OR SWEATING *NAUSEA AND VOMITING THAT IS NOT CONTROLLED WITH YOUR NAUSEA MEDICATION *UNUSUAL SHORTNESS OF BREATH *UNUSUAL BRUISING OR BLEEDING *URINARY PROBLEMS (pain or burning when urinating, or frequent urination) *BOWEL PROBLEMS (unusual diarrhea, constipation, pain near the anus) TENDERNESS IN MOUTH AND THROAT WITH OR WITHOUT PRESENCE OF ULCERS (sore throat, sores in mouth, or a toothache) UNUSUAL RASH, SWELLING OR PAIN  UNUSUAL VAGINAL DISCHARGE OR ITCHING   Items with * indicate a potential emergency and should be followed up as soon as possible or go to the Emergency Department if any problems should occur.  Please show the CHEMOTHERAPY ALERT CARD or IMMUNOTHERAPY ALERT CARD at  check-in to the Emergency Department and triage nurse.  Should you have questions after your visit or need to cancel or reschedule your appointment, please contact [facility]  Dept: [phone]  and follow the prompts.  Office hours are 8:00 a.m. to 4:30 p.m. Monday - Friday. Please note that voicemails left after 4:00 p.m. may not be returned until the following business day.  We are closed weekends and major holidays. You have access to a nurse at all times for urgent questions. Please call the main number to the clinic Dept: [phone] and follow the prompts.   For any non-urgent questions, you may also contact your provider using MyChart. We now offer e-Visits for anyone 67 and older to request care online for non-urgent symptoms. For details visit [email or web address].   Also download the MyChart app! Go to the app store, search "MyChart", open the app, select [facility], and log in with your MyChart username and password.  Masks are optional in the cancer centers. If you would like for your care team to wear a mask while they are taking care of you, please let them know. You may have one support person who is at least 66 years old accompany you for your appointments. Pemetrexed Injection What is this medication? PEMETREXED (PEM e TREX ed) treats some types of cancer. It works by slowing down the growth of cancer cells. This medicine may be used for other purposes; ask your health care provider or pharmacist if you have questions. COMMON BRAND NAME(S): Alimta, PEMFEXY What should I tell my care team before I take this medication? They need  to know if you have any of these conditions: Infection, such as chickenpox, cold sores, or herpes Kidney disease Low blood cell levels (white cells, red cells, and platelets) Lung or breathing disease, such as asthma Radiation therapy An unusual or allergic reaction to pemetrexed, other medications, foods, dyes, or  preservatives If you or your partner are pregnant or trying to get pregnant Breast-feeding How should I use this medication? This medication is injected into a vein. It is given by your care team in a hospital or clinic setting. Talk to your care team about the use of this medication in children. Special care may be needed. Overdosage: If you think you have taken too much of this medicine contact a poison control center or emergency room at once. NOTE: This medicine is only for you. Do not share this medicine with others. What if I miss a dose? Keep appointments for follow-up doses. It is important not to miss your dose. Call your care team if you are unable to keep an appointment. What may interact with this medication? Do not take this medication with any of the following: Live virus vaccines This medication may also interact with the following: Ibuprofen This list may not describe all possible interactions. Give your health care provider a list of all the medicines, herbs, non-prescription drugs, or dietary supplements you use. Also tell them if you smoke, drink alcohol, or use illegal drugs. Some items may interact with your medicine. What should I watch for while using this medication? Your condition will be monitored carefully while you are receiving this medication. This medication may make you feel generally unwell. This is not uncommon as chemotherapy can affect healthy cells as well as cancer cells. Report any side effects. Continue your course of treatment even though you feel ill unless your care team tells you to stop. This medication can cause serious side effects. To reduce the risk, your care team may give you other medications to take before receiving this one. Be sure to follow the directions from your care team. This medication can cause a rash or redness in areas of the body that have previously had radiation therapy. If you have had radiation therapy, tell your care team if  you notice a rash in this area. This medication may increase your risk of getting an infection. Call your care team for advice if you get a fever, chills, sore throat, or other symptoms of a cold or flu. Do not treat yourself. Try to avoid being around people who are sick. Be careful brushing or flossing your teeth or using a toothpick because you may get an infection or bleed more easily. If you have any dental work done, tell your dentist you are receiving this medication. Avoid taking medications that contain aspirin, acetaminophen, ibuprofen, naproxen, or ketoprofen unless instructed by your care team. These medications may hide a fever. Check with your care team if you have severe diarrhea, nausea, and vomiting, or if you sweat a lot. The loss of too much body fluid may make it dangerous for you to take this medication. Talk to your care team if you or your partner wish to become pregnant or think either of you might be pregnant. This medication can cause serious birth defects if taken during pregnancy and for 6 months after the last dose. A negative pregnancy test is required before starting this medication. A reliable form of contraception is recommended while taking this medication and for 6 months after the last dose. Talk  to your care team about reliable forms of contraception. Do not father a child while taking this medication and for 3 months after the last dose. Use a condom while having sex during this time period. Do not breastfeed while taking this medication and for 1 week after the last dose. This medication may cause infertility. Talk to your care team if you are concerned about your fertility. What side effects may I notice from receiving this medication? Side effects that you should report to your care team as soon as possible: Allergic reactions--skin rash, itching, hives, swelling of the face, lips, tongue, or throat Dry cough, shortness of breath or trouble  breathing Infection--fever, chills, cough, sore throat, wounds that don't heal, pain or trouble when passing urine, general feeling of discomfort or being unwell Kidney injury--decrease in the amount of urine, swelling of the ankles, hands, or feet Low red blood cell level--unusual weakness or fatigue, dizziness, headache, trouble breathing Redness, blistering, peeling, or loosening of the skin, including inside the mouth Unusual bruising or bleeding Side effects that usually do not require medical attention (report to your care team if they continue or are bothersome): Fatigue Loss of appetite Nausea Vomiting This list may not describe all possible side effects. Call your doctor for medical advice about side effects. You may report side effects to FDA at 1-800-FDA-1088. Where should I keep my medication? This medication is given in a hospital or clinic. It will not be stored at home. NOTE: This sheet is a summary. It may not cover all possible information. If you have questions about this medicine, talk to your doctor, pharmacist, or health care provider.  2023 Elsevier/Gold Standard (2021-05-26 00:00:00)

## 2021-10-01 ENCOUNTER — Other Ambulatory Visit: Payer: Self-pay | Admitting: Urology

## 2021-10-04 ENCOUNTER — Other Ambulatory Visit: Payer: Self-pay

## 2021-10-08 ENCOUNTER — Telehealth: Payer: Self-pay | Admitting: Pulmonary Disease

## 2021-10-08 NOTE — Telephone Encounter (Signed)
Fax received from Dr. Abner Greenspan with [facility] to perform a CYSTOSCOPY and right stent replacement on patient.  Patient needs surgery clearance. Surgery is 10/24/2021. Patient was seen on 03/12/2021. Office protocol is a risk assessment can be sent to surgeon if patient has been seen in 60 days or less.   Sending to Dr [name] for risk assessment or recommendations if patient needs to be seen in office prior to surgical procedure.    Called and left pt a voicemail stating for him to call office back for follow up with [place] or a NP to be cleared for surgery.

## 2021-10-09 ENCOUNTER — Telehealth: Payer: Self-pay | Admitting: Internal Medicine

## 2021-10-09 NOTE — Telephone Encounter (Signed)
Called patient regarding upcoming appointments, left a voicemail. 

## 2021-10-09 NOTE — Telephone Encounter (Signed)
Patient overdue for an appointment (due back 09/2021). Scheduled ov with Roxan Diesel, NP on 9/12 at 11:30am.

## 2021-10-14 ENCOUNTER — Ambulatory Visit (INDEPENDENT_AMBULATORY_CARE_PROVIDER_SITE_OTHER): Payer: No Typology Code available for payment source

## 2021-10-14 ENCOUNTER — Encounter: Payer: Self-pay | Admitting: Nurse Practitioner

## 2021-10-14 ENCOUNTER — Other Ambulatory Visit: Payer: Self-pay

## 2021-10-14 ENCOUNTER — Ambulatory Visit (INDEPENDENT_AMBULATORY_CARE_PROVIDER_SITE_OTHER): Payer: No Typology Code available for payment source | Admitting: Nurse Practitioner

## 2021-10-14 ENCOUNTER — Telehealth: Payer: Self-pay | Admitting: Nurse Practitioner

## 2021-10-14 VITALS — BP 90/70 | HR 107 | Ht 66.0 in | Wt 190.8 lb

## 2021-10-14 DIAGNOSIS — C3491 Malignant neoplasm of unspecified part of right bronchus or lung: Secondary | ICD-10-CM

## 2021-10-14 DIAGNOSIS — J449 Chronic obstructive pulmonary disease, unspecified: Secondary | ICD-10-CM | POA: Diagnosis not present

## 2021-10-14 DIAGNOSIS — Z01818 Encounter for other preprocedural examination: Secondary | ICD-10-CM

## 2021-10-14 DIAGNOSIS — I2690 Septic pulmonary embolism without acute cor pulmonale: Secondary | ICD-10-CM

## 2021-10-14 NOTE — Assessment & Plan Note (Signed)
Compensated on current regimen. 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 He does feel better since stopping Keytruda. Would not recommend stepping him up to triple therapy given his history of resistant MAI as he is able to complete daily activities and perform job duties without difficulty. He would like to attend pulmonary rehab to see about increasing his stamina and improving his lung function - referral sent today.   Patient Instructions  Continue Albuterol inhaler 2 puffs every 6 hours as needed for shortness of breath or wheezing. Notify if symptoms persist despite rescue inhaler/neb use.  Continue Stiolto 2 puffs daily  Continue allegra 1 tab daily Continue Xarelto 1 tab daily   You will be a moderate to high risk for surgery. Take all of your inhalers with you to surgery. Out of bed as soon as possible after surgery. Use incentive spirometer 10 times an hour while awake. Hold your Xarelto 48 hours prior to surgery. Resume day after, if cleared by your surgeon   Follow up with oncology as scheduled   Chest x ray today   Referred to pulmonary rehab  Follow up in 3 months with Dr. [name]. If symptoms do not improve or worsen, please contact office for sooner follow up or seek emergency care.

## 2021-10-14 NOTE — Assessment & Plan Note (Signed)
He was previously on combination therapy; however, had anaphylactic reaction to carboplatin after 2 cycles and then suspected pneumonitis related to [facility] so this was stopped after cycle 9. He was treated with high dose steroids. He is currently on single agent therapy with Alimta. He is scheduled for restaging CT this Friday, 9/15. Follow up with oncology as scheduled.

## 2021-10-14 NOTE — Progress Notes (Unsigned)
@Patient  ID: Philip [name], male    DOB: 08-21-1955, 66 y.o.   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 He has a history of stage III lung adenocarcinoma on single agent therapy with Alimta. [name] was held due to immunotherapy mediated pneumonitis. He is a patient of Dr. Kavin Leech and last seen in office 03/12/2021. Past medical history significant for provoked PE on Xarelto, allergic rhinitis, obesity.   TEST/EVENTS:  12/21/2018 PFT: FVC 76, FEV1 68, ratio 71, TLC 86, DLCOunc 64 08/15/2021 CT chest w contrast: unchanged post treatment appearance with dense b/l perihilar consolidation and fibrosis. Pneumonitis appears to be resolving. There is an unchanged trace, loculated right pleural effusion. Moderate to severe underlying centrilobular and paraseptal emphysema. Unchanged prominent mediastinal nodes. There is moderate right hydronephrosis, new.   03/12/2021: OV with Dr. [name] to establish care.  Last seen 01/2020 by Dr. Carlis Abbott.  Continues on Stiolto for maintenance for COPD.  Recurrence of cancer follow-up 2022.  Resume chemotherapy with carboplatin (dropped after 2 cycles due to hypersensitivity reaction), pemetrexed, Keytruda.  Overall symptoms stable.  Occasional morning cough.  He is unsure if Stiolto helps; has been off of it for a week at a time and resumed it and has not seen much improvement.  Okay with continuing for now.  Pulmonary embolus was triggered by his cancer; previously on Eliquis but insurance stopped covering so he was switched to Xarelto.  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  Cough not a problem.  Low suspicion for active disease.  Avoid ICS and steroids is much as possible.  Avoid monotherapy with macrolides due to risk of inducible resistance of MAI.  Encouraged smoking  cessation.  Follow-up 6 months.  10/14/2021: Today-follow-up Patient presents today for overdue follow-up and surgical clearance.  He has been struggling with frequent urination at night and found to have hydronephrosis.  He is set to undergo cystoscopy and right stent placement on 10/24/2021 with Dr. Abner Greenspan with [facility].  He reports that he has been relatively stable since his last visit.  He has had a slight increase in his dyspnea over the past few months but relates this to side effects of his cancer treatment.  He was diagnosed with pneumonitis after being treated with Keytruda.  Symptoms have improved since stopping but he does not quite feel back to his normal.  He is able to walk on flat ground without any difficulties.  Feels like he could walk 100 yards without having to stop.  He mainly gets winded with climbing or uphill walking.  He is still working and does have to climb stairs frequently for his job.  At this point does not feel like his breathing limits his ability to work.  Cough is stable without any increase sputum production.  When he does produce mucus it is clear.  No significant wheezing or chest congestion.  No recent fevers, chills, hemoptysis, increased lower extremity swelling, orthopnea.  He uses Stiolto daily.  Uses his rescue inhaler maybe once a week.  Takes Xarelto daily without fail.  Allergies  Allergen Reactions   Carboplatin Anaphylaxis, Shortness Of Breath, Cough and Hypertension    On dose 8. Became short of breath. Symptom management was called. Received benadryl, solumedrol, Pepcid and fluids. Medication discontinued.    Klonopin [Clonazepam] Other (See Comments)  nervous   Norco [Hydrocodone-Acetaminophen] Other (See Comments)    Made pt feel jittery     Immunization History  Administered Date(s) Administered   Influenza Split 11/20/2008   Influenza,inj,Quad PF,6+ Mos 10/24/2018, 11/08/2018, 01/15/2020   Influenza,inj,quad, With Preservative  03/15/2015   PFIZER(Purple Top)SARS-COV-2 Vaccination 04/27/2019, 05/11/2019, 12/07/2019, 12/26/2019   PNEUMOCOCCAL CONJUGATE-20 05/12/2021   Pneumococcal Polysaccharide-23 12/12/2007, 10/10/2019   Td 05/18/2014   Tdap 11/16/2006, 05/18/2014    Past Medical History:  Diagnosis Date   Hyperlipidemia    lung ca dx'd 12/2018   Pneumonia    Pulmonary embolus ([place])    Tobacco abuse     Tobacco History: Social History   Tobacco Use  Smoking Status Light Smoker   Years: 45.00   Types: Cigarettes   Start date: 07/04/1970  Smokeless Tobacco Former   Types: Chew  Tobacco Comments   Smoking 2 packs of cigarettes a week. 10/14/2021 Tay   Ready to quit: Not Answered Counseling given: Not Answered Tobacco comments: Smoking 2 packs of cigarettes a week. 10/14/2021 Tay   Outpatient Medications Prior to Visit  Medication Sig Dispense Refill   albuterol (VENTOLIN HFA) 108 (90 Base) MCG/ACT inhaler Inhale 2 puffs into the lungs every 4 (four) hours as needed for wheezing or shortness of breath. 18 g 6   dextromethorphan-guaiFENesin (MUCINEX DM) 30-600 MG 12hr tablet Take 1 tablet by mouth 2 (two) times daily as needed for cough. (Patient taking differently: Take 1 tablet by mouth 2 (two) times daily as needed (congestion.).) 40 tablet 0   fexofenadine (ALLEGRA) 180 MG tablet Take 180 mg by mouth in the morning.     Tiotropium Bromide-Olodaterol (STIOLTO RESPIMAT) 2.5-2.5 MCG/ACT AERS Inhale 2 puffs into the lungs daily. 1 each 11   famotidine (PEPCID) 20 MG tablet TAKE 1 TABLET BY MOUTH TWICE A DAY 196 tablet 1   folic acid (FOLVITE) 1 MG tablet TAKE 1 TABLET BY MOUTH EVERY DAY (Patient taking differently: Take 1 mg by mouth every evening.) 90 tablet 1   HYDROcodone-acetaminophen (NORCO) 5-325 MG tablet Take 1 tablet by mouth every 6 (six) hours as needed for moderate pain. (Patient taking differently: Take 0.5 tablets by mouth 2 (two) times daily as needed for severe pain.) 5 tablet 0    Multiple Vitamins-Minerals (MULTI ADULT GUMMIES) CHEW Chew 1 tablet by mouth in the morning and at bedtime.     naphazoline-pheniramine (VISINE) 0.025-0.3 % ophthalmic solution Place 1 drop into both eyes in the morning.     prochlorperazine (COMPAZINE) 10 MG tablet Take 1 tablet (10 mg total) by mouth every 6 (six) hours as needed for nausea or vomiting. 30 tablet 0   rivaroxaban (XARELTO) 20 MG TABS tablet Take 1 tablet (20 mg total) by mouth daily with supper. 30 tablet 11   atorvastatin (LIPITOR) 20 MG tablet Take 1 tablet by mouth daily.     No facility-administered medications prior to visit.     Review of Systems:   Constitutional: No weight loss or gain, night sweats, fevers, chills, or lassitude. +fatigue (baseline) HEENT: No headaches, difficulty swallowing, tooth/dental problems, or sore throat. No sneezing, itching, ear ache, nasal congestion, or post nasal drip CV:  No chest pain, orthopnea, PND, swelling in lower extremities, anasarca, dizziness, palpitations, syncope Resp: +shortness of breath with exertion; occasional cough. No excess mucus or change in color of mucus. No hemoptysis. No wheezing.  No chest wall deformity GI:  No heartburn, indigestion, abdominal pain, nausea, vomiting, diarrhea, change in bowel habits,  loss of appetite, bloody stools.  GU: +urinary frequency, nocturia. No dysuria, change in color of urine, urgency.  No flank pain, no hematuria  MSK:  No joint pain or swelling.  No decreased range of motion.  No back pain. Neuro: No dizziness or lightheadedness.  Psych: No depression or anxiety. Mood stable.     Physical Exam:  BP 90/70 (BP Location: Right Arm)   Pulse (!) 107   Ht 5\' 6"  (1.676 m)   Wt 190 lb 12.8 oz (86.5 kg)   SpO2 94%   BMI 30.80 kg/m   GEN: Pleasant, interactive, well-appearing; obese; in no acute distress. HEENT:  Normocephalic and atraumatic. PERRLA. Sclera white. Nasal turbinates pink, moist and patent bilaterally. No  rhinorrhea present. Oropharynx pink and moist, without exudate or edema. No lesions, ulcerations, or postnasal drip.  NECK:  Supple w/ fair ROM.  CV: RRR, no m/r/g, no peripheral edema. Pulses intact, +2 bilaterally. No cyanosis, pallor or clubbing. PULMONARY:  Unlabored, regular breathing. Diminished bases bilaterally otherwise clear A&P w/o wheezes/rales/rhonchi. No accessory muscle use. No dullness to percussion. GI: BS present and normoactive. Soft, non-tender to palpation. No organomegaly or masses detected. No CVA tenderness. MSK: No erythema, warmth or tenderness. Cap refil <2 sec all extrem. No deformities or joint swelling noted.  Neuro: A/Ox3. No focal deficits noted.   Skin: Warm, no lesions or rashe Psych: Normal affect and behavior. Judgement and thought content appropriate.     Lab Results:  CBC    Component Value Date/Time   WBC 5.0 09/29/2021 1108   WBC 3.2 (L) 02/04/2021 1900   RBC 4.38 09/29/2021 1108   HGB 14.1 09/29/2021 1108   HCT 41.0 09/29/2021 1108   PLT 268 09/29/2021 1108   MCV 93.6 09/29/2021 1108   MCH 32.2 09/29/2021 1108   MCHC 34.4 09/29/2021 1108   RDW 14.2 09/29/2021 1108   LYMPHSABS 0.8 09/29/2021 1108   MONOABS 0.5 09/29/2021 1108   EOSABS 0.1 09/29/2021 1108   BASOSABS 0.1 09/29/2021 1108    BMET    Component Value Date/Time   NA 139 09/29/2021 1108   K 3.9 09/29/2021 1108   CL 106 09/29/2021 1108   CO2 26 09/29/2021 1108   GLUCOSE 170 (H) 09/29/2021 1108   BUN 14 09/29/2021 1108   CREATININE 0.98 09/29/2021 1108   CALCIUM 9.4 09/29/2021 1108   GFRNONAA >60 09/29/2021 1108   GFRAA >60 11/02/2019 1001    BNP    Component Value Date/Time   BNP 63.5 06/16/2019 1517     Imaging:  NM Renal Imaging Flow W/Pharm  Result Date: 09/17/2021 CLINICAL DATA:  Abnormal CT, new RIGHT hydronephrosis, history of non-small cell lung cancer. EXAM: NUCLEAR MEDICINE RENAL SCAN WITH DIURETIC ADMINISTRATION TECHNIQUE: Radionuclide angiographic  and sequential renal images were obtained after intravenous injection of radiopharmaceutical. Imaging was continued during slow intravenous injection of Lasix approximately 15 minutes after the start of the examination. RADIOPHARMACEUTICALS:  5.33 mCi Technetium-29m MAG3 IV Pharmaceutical: 40 mg IV Lasix IV COMPARISON:  CT abdomen and pelvis 08/15/2021 FINDINGS: Flow:  Prompt symmetric arterial flow to the kidneys. Left renogram: Normal uptake, concentration, and excretion of tracer by LEFT kidney. Good clearance of tracer before and continuing following Lasix administration. No abnormal tracer retention at conclusion of exam. Analysis of renogram curve demonstrates a normal time to peak activity of 6.2 minutes with fall to half maximum activity 9.5 minutes later. Right renogram: Normal uptake and concentration of tracer by RIGHT kidney. Excretion of tracer  into a dilated RIGHT renal collecting system. Poor tracer clearance prior to Lasix. Following diuretic administration, partial washout of tracer is seen from the dilated renal collecting system but significant tracer residual is seen at the conclusion of the exam. Analysis of the renogram curve demonstrates a delayed time to peak activity of 19.2 minutes with failure to fall to half maximum activity by the conclusion of the exam. Differential: Left kidney = 55 % Right kidney = 45 % T1/2 post Lasix : Left kidney = 10.0 min Right kidney = N/A min IMPRESSION: Normal LEFT renogram. Dilated RIGHT renal collecting system with evidence of impaired clearance of contrast despite diuretic administration consistent with urinary outflow obstruction. Electronically Signed   By: Lavonia Dana M.D.   On: 09/17/2021 15:27    cyanocobalamin ((VITAMIN B-12)) injection 1,000 mcg     Date Action Dose Route User   08/18/2021 1302 Given 1,000 mcg Intramuscular (Left Deltoid) Tildon Husky, RN      PEMEtrexed (ALIMTA) 1,000 mg in sodium chloride 0.9 % 100 mL chemo infusion      Date Action Dose Route User   08/18/2021 1335 Rate/Dose Change (none) Intravenous Anastasia Pall, RN   08/18/2021 1335 Rate/Dose Change (none) Intravenous Anastasia Pall, RN   08/18/2021 1324 New Bag/Given 1,000 mg Intravenous Tildon Husky, RN      PEMEtrexed (ALIMTA) 1,000 mg in sodium chloride 0.9 % 100 mL chemo infusion     Date Action Dose Route User   09/08/2021 1244 Infusion Verify (none) Intravenous Wylene Men, RN   09/08/2021 1244 Rate/Dose Change (none) Intravenous Wylene Men, RN   09/08/2021 1244 New Bag/Given 1,000 mg Intravenous Severiano Gilbert, RN      PEMEtrexed (ALIMTA) 1,000 mg in sodium chloride 0.9 % 100 mL chemo infusion     Date Action Dose Route User   09/29/2021 1357 Infusion Verify (none) Intravenous Lester [place], RN   09/29/2021 1349 New Bag/Given 1,000 mg Intravenous Lester [place], RN      prochlorperazine (COMPAZINE) tablet 10 mg     Date Action Dose Route User   08/18/2021 1253 Given 10 mg Oral Anastasia Pall, RN      prochlorperazine (COMPAZINE) tablet 10 mg     Date Action Dose Route User   09/08/2021 1155 Given 10 mg Oral Severiano Gilbert, RN      prochlorperazine (COMPAZINE) tablet 10 mg     Date Action Dose Route User   09/29/2021 1312 Given 10 mg Oral Lester , RN      0.9 %  sodium chloride infusion     Date Action Dose Route User   08/18/2021 1339 Rate/Dose Change (none) Intravenous Anastasia Pall, RN   08/18/2021 1336 Rate/Dose Change (none) Intravenous Anastasia Pall, RN   08/18/2021 1245 New Bag/Given (none) Intravenous Marlowe Aschoff B, RN      0.9 %  sodium chloride infusion     Date Action Dose Route User   09/08/2021 1301 Rate/Dose Change (none) Intravenous Wylene Men, RN   09/08/2021 1255 Rate/Dose Change (none) Intravenous Wylene Men, RN   09/08/2021 1245 Infusion Verify (none) Intravenous Wylene Men, RN   09/08/2021 1230 Infusion Verify (none) Intravenous Wylene Men,  RN   09/08/2021 1226 Infusion Verify (none) Intravenous Wiley, Jacquelyn N, RN      0.9 %  sodium chloride infusion     Date Action Dose Route User   09/29/2021 1405 Rate/Dose  Change (none) Intravenous Lester [place], [place]   09/29/2021 1402 Rate/Dose Change (none) Intravenous Lester [place], [place]   09/29/2021 1352 Infusion Verify (none) Intravenous Lester [place], [place]   09/29/2021 1352 Infusion Verify (none) Intravenous Lester [place], [place]   09/29/2021 1343 Infusion Verify (none) Intravenous Lester [place], RN          Latest Ref Rng & Units 12/21/2018   12:38 PM  PFT Results  FVC-Pre L 3.07   FVC-Predicted Pre % 76   FVC-Post L 3.23   FVC-Predicted Post % 80   Pre FEV1/FVC % % 67   Post FEV1/FCV % % 71   FEV1-Pre L 2.07   FEV1-Predicted Pre % 68   FEV1-Post L 2.28   DLCO uncorrected ml/min/mmHg 15.47   DLCO UNC% % 64   DLVA Predicted % 79   TLC L 5.35   TLC % Predicted % 86   RV % Predicted % 104     No results found for: "NITRICOXIDE"      Assessment & Plan:   COPD, moderate (HCC) Compensated on current regimen. 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 He does feel better since stopping Keytruda. Would not recommend stepping him up to triple therapy given his history of resistant MAI as he is able to complete daily activities and perform job duties without difficulty. He would like to attend pulmonary rehab to see about increasing his stamina and improving his lung function - referral sent today.   Patient Instructions  Continue Albuterol inhaler 2 puffs every 6 hours as needed for shortness of breath or wheezing. Notify if symptoms persist despite rescue inhaler/neb use.  Continue Stiolto 2 puffs daily  Continue allegra 1 tab daily Continue Xarelto 1 tab daily   You will be a moderate to high risk for surgery. Take all of your inhalers with you to surgery. Out of bed as soon as possible after  surgery. Use incentive spirometer 10 times an hour while awake. Hold your Xarelto 48 hours prior to surgery. Resume day after, if cleared by your surgeon   Follow up with oncology as scheduled   Chest x ray today   Referred to pulmonary rehab  Follow up in 3 months with Dr. [name]. If symptoms do not improve or worsen, please contact office for sooner follow up or seek emergency care.     Adenocarcinoma of right lung, stage 3 ([place]) He was previously on combination therapy; however, had anaphylactic reaction to carboplatin after 2 cycles and then suspected pneumonitis related to [facility] so this was stopped after cycle 9. He was treated with high dose steroids. He is currently on single agent therapy with Alimta. He is scheduled for restaging CT this Friday, 9/15. Follow up with oncology as scheduled.   Acute pulmonary embolism (HCC) Provoked PE in setting of malignancy. He was on Eliquis initially and transitioned to [facility]. Currently stable without any excessive bruising or bleeding. He will need to hold anticoagulation 48 hours prior to his upcoming procedure and resume therapy the day of or day after, if appropriate.   Preoperative clearance Moderate to high risk. Factors that increase the risk for postoperative pulmonary complications are COPD, hx of PE, lung cancer, age, obesity.   Respiratory complications generally occur in 1% of ASA Class I patients, 5% of ASA Class II and 10% of ASA Class III-IV patients These complications rarely result in mortality and include postoperative  pneumonia, atelectasis, pulmonary embolism, ARDS and increased time requiring postoperative mechanical ventilation.   Overall, I recommend proceeding with the surgery if the risk for respiratory complications are outweighed by the potential benefits. This will need to be discussed between the patient and surgeon.   To reduce risks of respiratory complications, I recommend: --Pre- and  post-operative incentive spirometry performed frequently while awake --Inpatient use of currently prescribed bronchodilators --Short duration of surgery as much as possible and avoid paralytic if possible --OOB, encourage mobility post-op   1) RISK FOR PROLONGED MECHANICAL VENTILAION - > 48h  1A) Arozullah - Prolonged mech ventilation risk Arozullah Postperative Pulmonary Risk Score - for mech ventilation dependence >48h [employer], Ann Surg 2000, major non-cardiac surgery) Comment Score  Type of surgery - abd ao aneurysm (27), thoracic (21), neurosurgery / upper abdominal / vascular (21), neck (11) Cystoscopy/stent 0  Emergency Surgery - (11)  0  ALbumin < 3 or poor nutritional state - (9)  0  BUN > 30 -  (8)  0  Partial or completely dependent functional status - (7)  0  COPD -  (6)  6  Age - 60 to 66 (4), > 70  (6)  4  TOTAL  10  Risk Stratifcation scores  - < 10 (0.5%), 11-19 (1.8%), 20-27 (4.2%), 28-40 (10.1%), >40 (26.6%)  0.5%    I spent 35 minutes of dedicated to the care of this patient on the date of this encounter to include pre-visit review of records, face-to-face time with the patient discussing conditions above, post visit ordering of testing, clinical documentation with the electronic health record, making appropriate referrals as documented, and communicating necessary findings to members of the patients care team.  Clayton Bibles, NP 10/14/2021  Pt aware and understands NP's role.

## 2021-10-14 NOTE — Patient Instructions (Addendum)
Continue Albuterol inhaler 2 puffs every 6 hours as needed for shortness of breath or wheezing. Notify if symptoms persist despite rescue inhaler/neb use.  Continue Stiolto 2 puffs daily  Continue allegra 1 tab daily Continue Xarelto 1 tab daily   You will be a moderate to high risk for surgery. Take all of your inhalers with you to surgery. Out of bed as soon as possible after surgery. Use incentive spirometer 10 times an hour while awake. Hold your Xarelto 48 hours prior to surgery. Resume day after, if cleared by your surgeon   Follow up with oncology as scheduled   Chest x ray today   Referred to pulmonary rehab  Follow up in 3 months with Dr. [name]. If symptoms do not improve or worsen, please contact office for sooner follow up or seek emergency care.

## 2021-10-14 NOTE — Assessment & Plan Note (Signed)
Provoked PE in setting of malignancy. He was on Eliquis initially and transitioned to [facility]. Currently stable without any excessive bruising or bleeding. He will need to hold anticoagulation 48 hours prior to his upcoming procedure and resume therapy the day of or day after, if appropriate.

## 2021-10-14 NOTE — Assessment & Plan Note (Signed)
Moderate to high risk. Factors that increase the risk for postoperative pulmonary complications are COPD, hx of PE, lung cancer, age, obesity.   Respiratory complications generally occur in 1% of ASA Class I patients, 5% of ASA Class II and 10% of ASA Class III-IV patients These complications rarely result in mortality and include postoperative pneumonia, atelectasis, pulmonary embolism, ARDS and increased time requiring postoperative mechanical ventilation.   Overall, I recommend proceeding with the surgery if the risk for respiratory complications are outweighed by the potential benefits. This will need to be discussed between the patient and surgeon.   To reduce risks of respiratory complications, I recommend: --Pre- and post-operative incentive spirometry performed frequently while awake --Inpatient use of currently prescribed bronchodilators --Short duration of surgery as much as possible and avoid paralytic if possible --OOB, encourage mobility post-op   1) RISK FOR PROLONGED MECHANICAL VENTILAION - > 48h  1A) Arozullah - Prolonged mech ventilation risk Arozullah Postperative Pulmonary Risk Score - for mech ventilation dependence >48h [employer], Ann Surg 2000, major non-cardiac surgery) Comment Score  Type of surgery - abd ao aneurysm (27), thoracic (21), neurosurgery / upper abdominal / vascular (21), neck (11) Cystoscopy/stent 0  Emergency Surgery - (11)  0  ALbumin < 3 or poor nutritional state - (9)  0  BUN > 30 -  (8)  0  Partial or completely dependent functional status - (7)  0  COPD -  (6)  6  Age - 60 to 69 (4), > 70  (6)  4  TOTAL  10  Risk Stratifcation scores  - < 10 (0.5%), 11-19 (1.8%), 20-27 (4.2%), 28-40 (10.1%), >40 (26.6%)  0.5%

## 2021-10-15 ENCOUNTER — Other Ambulatory Visit: Payer: Self-pay

## 2021-10-15 ENCOUNTER — Encounter: Payer: Self-pay | Admitting: Nurse Practitioner

## 2021-10-15 NOTE — Telephone Encounter (Signed)
OV notes and clearance form have been faxed back to [facility]. Nothing further needed at this time.

## 2021-10-15 NOTE — Telephone Encounter (Signed)
Pulmonary Rehab referral placed.  Nothing further needed.

## 2021-10-17 ENCOUNTER — Ambulatory Visit (HOSPITAL_COMMUNITY)
Admission: RE | Admit: 2021-10-17 | Discharge: 2021-10-17 | Disposition: A | Discharge status: Home / Self Care | Payer: No Typology Code available for payment source | Source: Ambulatory Visit | Attending: Physician Assistant | Admitting: Physician Assistant

## 2021-10-17 ENCOUNTER — Encounter (HOSPITAL_COMMUNITY): Payer: Self-pay

## 2021-10-17 DIAGNOSIS — C3490 Malignant neoplasm of unspecified part of unspecified bronchus or lung: Secondary | ICD-10-CM | POA: Diagnosis present

## 2021-10-17 DIAGNOSIS — N1330 Unspecified hydronephrosis: Secondary | ICD-10-CM | POA: Insufficient documentation

## 2021-10-17 NOTE — Patient Instructions (Signed)
SURGICAL WAITING ROOM VISITATION Patients having surgery or a procedure may have no more than 2 support people in the waiting area - these visitors may rotate.   Children under the age of 83 must have an adult with them who is not the patient. If the patient needs to stay at the hospital during part of their recovery, the visitor guidelines for inpatient rooms apply. Pre-op nurse will coordinate an appropriate time for 1 support person to accompany patient in pre-op.  This support person may not rotate.    Please refer to the [facility] website for the visitor guidelines for Inpatients (after your surgery is over and you are in a regular room).    Your procedure is scheduled on: 10/24/21   Report to [facility] Main Entrance    Report to admitting at 11:15 AM   Call this number if you have problems the morning of surgery [phone]   Do not eat food :After Midnight.   After Midnight you may have the following liquids until 10:30 AM DAY OF SURGERY  Water Non-Citrus Juices (without pulp, NO RED) Carbonated Beverages Black Coffee (NO MILK/CREAM OR CREAMERS, sugar ok)  Clear Tea (NO MILK/CREAM OR CREAMERS, sugar ok) regular and decaf                             Plain Jell-O (NO RED)                                           Fruit ices (not with fruit pulp, NO RED)                                     Popsicles (NO RED)                                                               Sports drinks like Gatorade (NO RED)              FOLLOW BOWEL PREP AND ANY ADDITIONAL PRE OP INSTRUCTIONS YOU RECEIVED FROM YOUR SURGEON'S OFFICE!!!     Oral Hygiene is also important to reduce your risk of infection.                                    Remember - BRUSH YOUR TEETH THE MORNING OF SURGERY WITH YOUR REGULAR TOOTHPASTE   Do NOT smoke after Midnight   Take these medicines the morning of surgery with A SIP OF WATER: Tylenol, Inhalers, Pepcid, Allegra, Norco  These are anesthesia  recommendations for holding your anticoagulants.  Please contact your prescribing physician to confirm IF it is safe to hold your anticoagulants for this length of time.   Eliquis Apixaban   72 hours   Xarelto Rivaroxaban   72 hours  Plavix Clopidogrel   120 hours  Pletal Cilostazol   120 hours     DO NOT TAKE ANY ORAL DIABETIC MEDICATIONS DAY OF YOUR SURGERY  Bring CPAP mask and tubing  day of surgery.                              You may not have any metal on your body including jewelry, and body piercing             Do not wear lotions, powders, cologne, or deodorant              Men may shave face and neck.   Do not bring valuables to the hospital. [place].   Contacts, dentures or bridgework may not be worn into surgery.  DO NOT [place]. PHARMACY WILL DISPENSE MEDICATIONS LISTED ON YOUR MEDICATION LIST TO YOU DURING YOUR ADMISSION [facility]!    Patients discharged on the day of surgery will not be allowed to drive home.  Someone NEEDS to stay with you for the first 24 hours after anesthesia.   Special Instructions: Bring a copy of your healthcare power of attorney and living will documents         the day of surgery if you haven't scanned them before.              Please read over the following fact sheets you were given: IF YOU HAVE QUESTIONS ABOUT YOUR PRE-OP INSTRUCTIONS PLEASE CALL [facility] - Preparing for Surgery Before surgery, you can play an important role.  Because skin is not sterile, your skin needs to be as free of germs as possible.  You can reduce the number of germs on your skin by washing with CHG (chlorahexidine gluconate) soap before surgery.  CHG is an antiseptic cleaner which kills germs and bonds with the skin to continue killing germs even after washing. Please DO NOT use if you have an allergy to CHG or antibacterial soaps.  If your skin  becomes reddened/irritated stop using the CHG and inform your nurse when you arrive at Short Stay. Do not shave (including legs and underarms) for at least 48 hours prior to the first CHG shower.  You may shave your face/neck.  Please follow these instructions carefully:  1.  Shower with CHG Soap the night before surgery and the  morning of surgery.  2.  If you choose to wash your hair, wash your hair first as usual with your normal  shampoo.  3.  After you shampoo, rinse your hair and body thoroughly to remove the shampoo.                             4.  Use CHG as you would any other liquid soap.  You can apply chg directly to the skin and wash.  Gently with a scrungie or clean washcloth.  5.  Apply the CHG Soap to your body ONLY FROM THE NECK DOWN.   Do   not use on face/ open                           Wound or open sores. Avoid contact with eyes, ears mouth and   genitals (private parts).                       Wash face,  Genitals (private parts) with your normal soap.             6.  Wash thoroughly, paying special attention to the area where your    surgery  will be performed.  7.  Thoroughly rinse your body with warm water from the neck down.  8.  DO NOT shower/wash with your normal soap after using and rinsing off the CHG Soap.                9.  Pat yourself dry with a clean towel.            10.  Wear clean pajamas.            11.  Place clean sheets on your bed the night of your first shower and do not  sleep with pets. Day of Surgery : Do not apply any lotions/deodorants the morning of surgery.  Please wear clean clothes to the hospital/surgery center.  FAILURE TO FOLLOW THESE INSTRUCTIONS MAY RESULT IN THE CANCELLATION OF YOUR SURGERY  PATIENT SIGNATURE_________________________________  NURSE SIGNATURE__________________________________  ________________________________________________________________________

## 2021-10-17 NOTE — Progress Notes (Signed)
COVID Vaccine Completed: yes x4  Date of COVID positive in last 90 days:  PCP - [name], MD Cardiologist -   Pulmonary clearance by Marland Kitchen 10/14/21 in Epic  Chest x-ray - CT 10/17/21 Epic EKG - 01/15/21 Epic Stress Test -  ECHO - 06/17/19 Epic Cardiac Cath -  Pacemaker/ICD device last checked: Spinal Cord Stimulator:  Bowel Prep -   Sleep Study -  CPAP -   Fasting Blood Sugar -  Checks Blood Sugar _____ times a day  Blood Thinner Instructions: Xarelto Aspirin Instructions: Last Dose:  Activity level:  Can go up a flight of stairs and perform activities of daily living without stopping and without symptoms of chest pain or shortness of breath.  Able to exercise without symptoms  Unable to go up a flight of stairs without symptoms of     Anesthesia review: block on EKG, PE, COPD, stage 3 adenocarcinoma   Patient denies shortness of breath, fever, cough and chest pain at PAT appointment  Patient verbalized understanding of instructions that were given to them at the PAT appointment. Patient was also instructed that they will need to review over the PAT instructions again at home before surgery.

## 2021-10-20 ENCOUNTER — Encounter (HOSPITAL_COMMUNITY): Payer: Self-pay

## 2021-10-20 ENCOUNTER — Inpatient Hospital Stay: Payer: No Typology Code available for payment source | Attending: Internal Medicine | Admitting: Internal Medicine

## 2021-10-20 ENCOUNTER — Encounter: Payer: Self-pay | Admitting: Internal Medicine

## 2021-10-20 ENCOUNTER — Inpatient Hospital Stay: Payer: No Typology Code available for payment source

## 2021-10-20 ENCOUNTER — Encounter (HOSPITAL_COMMUNITY)
Admission: RE | Admit: 2021-10-20 | Discharge: 2021-10-20 | Disposition: A | Discharge status: Home / Self Care | Payer: No Typology Code available for payment source | Source: Ambulatory Visit | Attending: Urology | Admitting: Urology

## 2021-10-20 VITALS — BP 107/78 | HR 86 | Resp 16

## 2021-10-20 VITALS — BP 137/92 | HR 92 | Temp 97.0°F | Resp 18 | Wt 191.4 lb

## 2021-10-20 DIAGNOSIS — Z79899 Other long term (current) drug therapy: Secondary | ICD-10-CM | POA: Diagnosis not present

## 2021-10-20 DIAGNOSIS — Z7901 Long term (current) use of anticoagulants: Secondary | ICD-10-CM | POA: Insufficient documentation

## 2021-10-20 DIAGNOSIS — E785 Hyperlipidemia, unspecified: Secondary | ICD-10-CM | POA: Insufficient documentation

## 2021-10-20 DIAGNOSIS — Z923 Personal history of irradiation: Secondary | ICD-10-CM | POA: Diagnosis not present

## 2021-10-20 DIAGNOSIS — J432 Centrilobular emphysema: Secondary | ICD-10-CM | POA: Insufficient documentation

## 2021-10-20 DIAGNOSIS — Z01818 Encounter for other preprocedural examination: Secondary | ICD-10-CM | POA: Insufficient documentation

## 2021-10-20 DIAGNOSIS — E538 Deficiency of other specified B group vitamins: Secondary | ICD-10-CM | POA: Insufficient documentation

## 2021-10-20 DIAGNOSIS — Z86711 Personal history of pulmonary embolism: Secondary | ICD-10-CM | POA: Insufficient documentation

## 2021-10-20 DIAGNOSIS — C3491 Malignant neoplasm of unspecified part of right bronchus or lung: Secondary | ICD-10-CM

## 2021-10-20 DIAGNOSIS — K219 Gastro-esophageal reflux disease without esophagitis: Secondary | ICD-10-CM | POA: Diagnosis not present

## 2021-10-20 DIAGNOSIS — Z5111 Encounter for antineoplastic chemotherapy: Secondary | ICD-10-CM | POA: Insufficient documentation

## 2021-10-20 DIAGNOSIS — R5383 Other fatigue: Secondary | ICD-10-CM | POA: Insufficient documentation

## 2021-10-20 DIAGNOSIS — D3502 Benign neoplasm of left adrenal gland: Secondary | ICD-10-CM | POA: Insufficient documentation

## 2021-10-20 HISTORY — DX: Headache, unspecified: R51.9

## 2021-10-20 HISTORY — DX: Gastro-esophageal reflux disease without esophagitis: K21.9

## 2021-10-20 HISTORY — DX: Chronic obstructive pulmonary disease, unspecified: J44.9

## 2021-10-20 LAB — CMP (CANCER CENTER ONLY)
ALT: 19 U/L (ref 0–44)
AST: 24 U/L (ref 15–41)
Albumin: 3.9 g/dL (ref 3.5–5.0)
Alkaline Phosphatase: 103 U/L (ref 38–126)
Anion gap: 9 (ref 5–15)
BUN: 13 mg/dL (ref 8–23)
CO2: 26 mmol/L (ref 22–32)
Calcium: 9.3 mg/dL (ref 8.9–10.3)
Chloride: 102 mmol/L (ref 98–111)
Creatinine: 1.03 mg/dL (ref 0.61–1.24)
GFR, Estimated: >60 mL/min (ref >60)
Glucose, Bld: 124 mg/dL — ABNORMAL HIGH (ref 70–99)
Potassium: 4.2 mmol/L (ref 3.5–5.1)
Sodium: 137 mmol/L (ref 135–145)
Total Bilirubin: 0.4 mg/dL (ref 0.3–1.2)
Total Protein: 7.5 g/dL (ref 6.5–8.1)

## 2021-10-20 LAB — CBC WITH DIFFERENTIAL (CANCER CENTER ONLY)
Abs Immature Granulocytes: 0.03 10*3/uL (ref 0.00–0.07)
Basophils Absolute: 0.1 10*3/uL (ref 0.0–0.1)
Basophils Relative: 1 %
Eosinophils Absolute: 0.1 10*3/uL (ref 0.0–0.5)
Eosinophils Relative: 1 %
HCT: 42.9 % (ref 39.0–52.0)
Hemoglobin: 14.4 g/dL (ref 13.0–17.0)
Immature Granulocytes: 1 %
Lymphocytes Relative: 17 %
Lymphs Abs: 1 10*3/uL (ref 0.7–4.0)
MCH: 33 pg (ref 26.0–34.0)
MCHC: 33.6 g/dL (ref 30.0–36.0)
MCV: 98.4 fL (ref 80.0–100.0)
Monocytes Absolute: 0.7 10*3/uL (ref 0.1–1.0)
Monocytes Relative: 12 %
Neutro Abs: 3.9 10*3/uL (ref 1.7–7.7)
Neutrophils Relative %: 68 %
Platelet Count: 322 10*3/uL (ref 150–400)
RBC: 4.36 MIL/uL (ref 4.22–5.81)
RDW: 14.2 % (ref 11.5–15.5)
WBC Count: 5.8 10*3/uL (ref 4.0–10.5)
nRBC: 0 % (ref 0.0–0.2)

## 2021-10-20 LAB — TSH: TSH: 2.551 u[IU]/mL (ref 0.350–4.500)

## 2021-10-20 MED ORDER — SODIUM CHLORIDE 0.9 % IV SOLN: Freq: Once | INTRAVENOUS | Status: AC

## 2021-10-20 MED ORDER — CYANOCOBALAMIN 1000 MCG/ML IJ SOLN
1000.0000 ug | Freq: Once | INTRAMUSCULAR | Status: AC
  Administered 2021-10-20: 1000 ug via INTRAMUSCULAR
  Filled 2021-10-20: qty 1

## 2021-10-20 MED ORDER — SODIUM CHLORIDE 0.9 % IV SOLN
500.0000 mg/m2 | Freq: Once | INTRAVENOUS | Status: AC
  Administered 2021-10-20: 1000 mg via INTRAVENOUS
  Filled 2021-10-20: qty 40

## 2021-10-20 MED ORDER — PROCHLORPERAZINE MALEATE 10 MG PO TABS
10.0000 mg | ORAL_TABLET | Freq: Once | ORAL | Status: AC
  Administered 2021-10-20: 10 mg via ORAL
  Filled 2021-10-20: qty 1

## 2021-10-20 NOTE — Patient Instructions (Signed)
[facility]  Discharge Instructions: Thank you for choosing [facility] to provide your oncology and hematology care.   If you have a lab appointment with the [facility], please go directly to the [facility] and check in at the registration area.   Wear comfortable clothing and clothing appropriate for easy access to any Portacath or PICC line.   We strive to give you quality time with your provider. You may need to reschedule your appointment if you arrive late (15 or more minutes).  Arriving late affects you and other patients whose appointments are after yours.  Also, if you miss three or more appointments without notifying the office, you may be dismissed from the clinic at the provider's discretion.      For prescription refill requests, have your pharmacy contact our office and allow 72 hours for refills to be completed.    Today you received the following chemotherapy and/or immunotherapy agents: Alimta      To help prevent nausea and vomiting after your treatment, we encourage you to take your nausea medication as directed.  BELOW ARE SYMPTOMS THAT SHOULD BE REPORTED IMMEDIATELY: *FEVER GREATER THAN 100.4 F (38 C) OR HIGHER *CHILLS OR SWEATING *NAUSEA AND VOMITING THAT IS NOT CONTROLLED WITH YOUR NAUSEA MEDICATION *UNUSUAL SHORTNESS OF BREATH *UNUSUAL BRUISING OR BLEEDING *URINARY PROBLEMS (pain or burning when urinating, or frequent urination) *BOWEL PROBLEMS (unusual diarrhea, constipation, pain near the anus) TENDERNESS IN MOUTH AND THROAT WITH OR WITHOUT PRESENCE OF ULCERS (sore throat, sores in mouth, or a toothache) UNUSUAL RASH, SWELLING OR PAIN  UNUSUAL VAGINAL DISCHARGE OR ITCHING   Items with * indicate a potential emergency and should be followed up as soon as possible or go to the Emergency Department if any problems should occur.  Please show the CHEMOTHERAPY ALERT CARD or IMMUNOTHERAPY ALERT CARD at check-in to the  Emergency Department and triage nurse.  Should you have questions after your visit or need to cancel or reschedule your appointment, please contact [facility]  Dept: [phone]  and follow the prompts.  Office hours are 8:00 a.m. to 4:30 p.m. Monday - Friday. Please note that voicemails left after 4:00 p.m. may not be returned until the following business day.  We are closed weekends and major holidays. You have access to a nurse at all times for urgent questions. Please call the main number to the clinic Dept: [phone] and follow the prompts.   For any non-urgent questions, you may also contact your provider using MyChart. We now offer e-Visits for anyone 47 and older to request care online for non-urgent symptoms. For details visit [email or web address].   Also download the MyChart app! Go to the app store, search "MyChart", open the app, select  [facility], and log in with your MyChart username and password.  Masks are optional in the cancer centers. If you would like for your care team to wear a mask while they are taking care of you, please let them know. You may have one support person who is at least 66 years old accompany you for your appointments.

## 2021-10-20 NOTE — Progress Notes (Signed)
[facility] [phone]   Fax:([phone]  OFFICE PROGRESS NOTE  [name], MD [address] [address]  DIAGNOSIS:  1) recurrent non-small cell lung cancer initially diagnosed as stage IIIc (T4, N3, M0) non-small cell lung cancer, adenocarcinoma presented with large right hilar mass in addition to bilateral hilar and mediastinal lymphadenopathy diagnosed in December 2020.  The patient has evidence for disease recurrence in October 2022. 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  Started on Xarelto on March 17, 2020 and currently 20 mg p.o. daily.   PRIOR THERAPY:  1) Weekly concurrent chemoradiation with carboplatin for an AUC of 2 and paclitaxel 45 mg/m2. First dose starting 01/30/2019.   Status post 6 cycles. 2) Consolidation immunotherapy with Imfinzi 1500 mg IV every 4 weeks. First dose expected on 04/19/2019.  Status post 13 cycles. 3) disease recurrence in October 2022.   CURRENT THERAPY: Systemic chemotherapy with carboplatin for AUC of 5, Alimta 500 Mg/M2 and Keytruda 200 Mg IV every 3 weeks.  First dose January 01, 2021.  Status post 14 cycles.  He had hypersensitivity reaction to carboplatin and this was discontinued after cycle #2.  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  INTERVAL HISTORY: Philip [name] 66 y.o. male returns to the clinic today for follow-up visit.  The patient is feeling fine today with no concerning complaints except for swelling of the face and hands.  He has a history of hydronephrosis and he underwent stent placement by urology few days ago.  He denied having any current chest pain, shortness of breath except with exertion with no cough or hemoptysis.  He has no nausea, vomiting, diarrhea or constipation.  He has no headache or  visual changes.  He denied having any recent weight loss or night sweats.  He is here today for evaluation with repeat CT scan of the chest, abdomen and pelvis for restaging of his disease.  MEDICAL HISTORY: Past Medical History:  Diagnosis Date   COPD (chronic obstructive pulmonary disease) (HCC)    GERD (gastroesophageal reflux disease)    Headache    migraines   Hyperlipidemia    lung ca dx'd 12/2018   Pneumonia    Pulmonary embolus (HCC)    Tobacco abuse     ALLERGIES:  is allergic to carboplatin, klonopin [clonazepam], and norco [hydrocodone-acetaminophen].  MEDICATIONS:  Current Outpatient Medications  Medication Sig Dispense Refill   acetaminophen (TYLENOL) 500 MG tablet Take 500-1,000 mg by mouth every 6 (six) hours as needed (pain.).     albuterol (VENTOLIN HFA) 108 (90 Base) MCG/ACT inhaler Inhale 2 puffs into the lungs every 4 (four) hours as needed for wheezing or shortness of breath. 18 g 6   dextromethorphan-guaiFENesin (MUCINEX DM) 30-600 MG 12hr tablet Take 1 tablet by mouth 2 (two) times daily as needed for cough. (Patient taking differently: Take 1 tablet by mouth 2 (two) times daily as needed (congestion.).) 40 tablet 0   famotidine (PEPCID) 20 MG tablet TAKE 1 TABLET BY MOUTH TWICE A DAY 180 tablet 1   fexofenadine (ALLEGRA) 180 MG tablet Take 180 mg by mouth in the morning.     folic acid (FOLVITE) 1 MG tablet TAKE 1 TABLET BY MOUTH EVERY DAY (Patient taking differently: Take 1 mg by mouth every evening.) 90 tablet 1  HYDROcodone-acetaminophen (NORCO) 5-325 MG tablet Take 1 tablet by mouth every 6 (six) hours as needed for moderate pain. (Patient taking differently: Take 0.5 tablets by mouth 2 (two) times daily as needed for severe pain.) 5 tablet 0   Multiple Vitamins-Minerals (MULTI ADULT GUMMIES) CHEW Chew 1 tablet by mouth in the morning and at bedtime.     naphazoline-pheniramine (VISINE) 0.025-0.3 % ophthalmic solution Place 1 drop into both eyes in the  morning.     prochlorperazine (COMPAZINE) 10 MG tablet Take 1 tablet (10 mg total) by mouth every 6 (six) hours as needed for nausea or vomiting. 30 tablet 0   rivaroxaban (XARELTO) 20 MG TABS tablet Take 1 tablet (20 mg total) by mouth daily with supper. 30 tablet 11   Tiotropium Bromide-Olodaterol (STIOLTO RESPIMAT) 2.5-2.5 MCG/ACT AERS Inhale 2 puffs into the lungs daily. 1 each 11   No current facility-administered medications for this visit.    SURGICAL HISTORY:  Past Surgical History:  Procedure Laterality Date   COLONOSCOPY     HAND SURGERY     VIDEO BRONCHOSCOPY WITH ENDOBRONCHIAL ULTRASOUND N/A 12/22/2018   Procedure: VIDEO BRONCHOSCOPY WITH ENDOBRONCHIAL ULTRASOUND;  Surgeon: Garner Nash, DO;  Location: [facility];  Service: Thoracic;  Laterality: N/A;   VIDEO BRONCHOSCOPY WITH ENDOBRONCHIAL ULTRASOUND N/A 01/09/2019   Procedure: VIDEO BRONCHOSCOPY WITH ENDOBRONCHIAL ULTRASOUND WITH FLUORO;  Surgeon: Garner Nash, DO;  Location: [facility];  Service: Thoracic;  Laterality: N/A;   VIDEO BRONCHOSCOPY WITH RADIAL ENDOBRONCHIAL ULTRASOUND N/A 12/22/2018   Procedure: RADIAL ENDOBRONCHIAL ULTRASOUND;  Surgeon: Garner Nash, DO;  Location: [facility];  Service: Thoracic;  Laterality: N/A;    REVIEW OF SYSTEMS:  Constitutional: positive for fatigue Eyes: negative Ears, nose, mouth, throat, and face: negative Respiratory: positive for dyspnea on exertion Cardiovascular: negative Gastrointestinal: negative Genitourinary:negative Integument/breast: negative Hematologic/lymphatic: negative Musculoskeletal:negative Neurological: negative Behavioral/Psych: negative Endocrine: negative Allergic/Immunologic: negative   PHYSICAL EXAMINATION: General appearance: alert, cooperative, fatigued, and no distress Head: Normocephalic, without obvious abnormality, atraumatic Neck: no adenopathy, no JVD, supple, symmetrical, trachea midline, and thyroid not enlarged, symmetric, no  tenderness/mass/nodules Lymph nodes: Cervical, supraclavicular, and axillary nodes normal. Resp: clear to auscultation bilaterally Back: symmetric, no curvature. ROM normal. No CVA tenderness. Cardio: regular rate and rhythm, S1, S2 normal, no murmur, click, rub or gallop GI: soft, non-tender; bowel sounds normal; no masses,  no organomegaly Extremities: extremities normal, atraumatic, no cyanosis or edema Neurologic: Alert and oriented X 3, normal strength and tone. Normal symmetric reflexes. Normal coordination and gait  ECOG PERFORMANCE STATUS: 1 - Symptomatic but completely ambulatory  Blood pressure (!) 137/92, pulse 92, temperature (!) 97 F (36.1 C), temperature source Oral, resp. rate 18, weight 191 lb 6.4 oz (86.8 kg), SpO2 92 %.  LABORATORY DATA: Lab Results  Component Value Date   WBC 5.8 10/20/2021   HGB 14.4 10/20/2021   HCT 42.9 10/20/2021   MCV 98.4 10/20/2021   PLT 322 10/20/2021      Chemistry      Component Value Date/Time   NA 139 09/29/2021 1108   K 3.9 09/29/2021 1108   CL 106 09/29/2021 1108   CO2 26 09/29/2021 1108   BUN 14 09/29/2021 1108   CREATININE 0.98 09/29/2021 1108      Component Value Date/Time   CALCIUM 9.4 09/29/2021 1108   ALKPHOS 99 09/29/2021 1108   AST 24 09/29/2021 1108   ALT 21 09/29/2021 1108   BILITOT 0.6 09/29/2021 1108  RADIOGRAPHIC STUDIES: CT CHEST ABDOMEN PELVIS WO CONTRAST  Result Date: 10/19/2021 CLINICAL DATA:  Non-small-cell lung cancer. Restaging. * Tracking Code: BO * EXAM: CT CHEST, ABDOMEN AND PELVIS WITHOUT CONTRAST TECHNIQUE: Multidetector CT imaging of the chest, abdomen and pelvis was performed following the standard protocol without IV contrast. RADIATION DOSE REDUCTION: This exam was performed according to the departmental dose-optimization program which includes automated exposure control, adjustment of the mA and/or kV according to patient size and/or use of iterative reconstruction technique.  COMPARISON:  08/15/2021 FINDINGS: CT CHEST FINDINGS Cardiovascular: The heart size is normal. No substantial pericardial effusion. Coronary artery calcification is evident. Mild atherosclerotic calcification is noted in the wall of the thoracic aorta. Mediastinum/Nodes: Stable upper normal to borderline enlarged mediastinal lymph nodes. Post treatment scarring again noted both hilar regions. The esophagus has normal imaging features. There is no axillary lymphadenopathy. Lungs/Pleura: Centrilobular and paraseptal emphysema evident. Volume loss with fibrosis in the parahilar lungs bilaterally is similar to prior consistent with post treatment scarring no new suspicious pulmonary nodule or mass trace posterior right pleural effusion is stable. Musculoskeletal: The lungs are clear without focal pneumonia, edema, pneumothorax or pleural effusion. CT ABDOMEN PELVIS FINDINGS Hepatobiliary: No suspicious focal abnormality in the liver on this study without intravenous contrast. There is no evidence for gallstones, gallbladder wall thickening, or pericholecystic fluid. No intrahepatic or extrahepatic biliary dilation. Pancreas: No focal mass lesion. No dilatation of the main duct. No intraparenchymal cyst. No peripancreatic edema. Spleen: No splenomegaly. No focal mass lesion. Adrenals/Urinary Tract: Right adrenal gland unremarkable. Stable small left adrenal adenoma. Right-sided hydronephrosis evident without hydroureter suggesting UPJ obstruction. Left kidney and ureter unremarkable. The urinary bladder appears normal for the degree of distention. Stomach/Bowel: Stomach is decompressed. Duodenum is normally positioned as is the ligament of Treitz. No small bowel wall thickening. No small bowel dilatation. The terminal ileum is normal. The appendix is normal. No gross colonic mass. No colonic wall thickening. Vascular/Lymphatic: There is mild atherosclerotic calcification of the abdominal aorta without aneurysm. There is  no gastrohepatic or hepatoduodenal ligament lymphadenopathy. No retroperitoneal or mesenteric lymphadenopathy. No pelvic sidewall lymphadenopathy. Reproductive: The prostate gland and seminal vesicles are unremarkable. Other: No intraperitoneal free fluid. Musculoskeletal: No worrisome lytic or sclerotic osseous abnormality. IMPRESSION: 1. Stable exam. Scattered borderline enlarged mediastinal lymph nodes are unchanged. No new findings to raise concern for recurrent or metastatic disease in the chest, abdomen, or pelvis. 2. Stable small left adrenal adenoma. 3. Stable right-sided hydronephrosis without hydroureter suggesting UPJ obstruction. 4. Aortic Atherosclerosis (ICD10-I70.0) and Emphysema (ICD10-J43.9). Electronically Signed   By: Misty Stanley M.D.   On: 10/19/2021 07:20   DG Chest 2 View  Result Date: 10/15/2021 CLINICAL DATA:  66 year old male with preoperative chest x-ray EXAM: CHEST - 2 VIEW COMPARISON:  01/15/2021, chest CT 08/15/2021 FINDINGS: Cardiomediastinal silhouette unchanged in size and contour. Similar asymmetric elevation of the right hemidiaphragm. Mixed architectural distortion and consolidation at the medial right lung base as well as in the left hilar region, present on the comparison CT scan. No pneumothorax. No pleural effusion. No confluent airspace disease. Coarsened interstitial markings present bilaterally. IMPRESSION: Chronic/post treatment effects of the chest, with no definite acute cardiopulmonary disease Electronically Signed   By: Corrie Mckusick D.O.   On: 10/15/2021 12:33     ASSESSMENT AND PLAN: This is a very pleasant 66 years old white male with metastatic non-small cell lung cancer that was initially diagnosed as stage IIIc non-small cell lung cancer, adenocarcinoma diagnosed in December  2020 The patient completed a course of concurrent chemoradiation with weekly carboplatin and paclitaxel status post 6 cycles and he tolerated his treatment well and has partial  response. He underwent consolidation treatment with immunotherapy with Imfinzi 1500 mg IV every 4 weeks status post 13 cycles. He was on observation and feeling fine with no concerning complaints except for mild cough as well as the left shoulder pain. His scan showed increase in the size of the soft tissue nodule in the right middle lobe adjacent to the postradiation changes highly suspicious for recurrent disease.  There was also soft tissue of the right hilum/as ago esophageal recess unchanged at but increased compared to remote prior studies. The patient had a PET scan that showed hypermetabolic nodule in the right lower lobe adjacent to the post radiation treatment site increased in size and consistent with lung cancer recurrence.  There was second hypermetabolic nodule in the right lower lobe above the diaphragm concerning for recurrence and hypermetabolic contralateral lymph node in the high left paratracheal mediastinum also concerning for metastatic adenopathy recurrence. He started systemic chemotherapy with carboplatin for AUC of 5, Alimta 500 Mg/M2 and Keytruda 200 Mg IV every 3 weeks on January 01, 2021.   Carboplatin was discontinued with cycle #2 secondary to hypersensitivity reaction.  The patient is status post 14 cycles.  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  The patient is currently on single agent treatment with Alimta. The patient has been tolerating this treatment well with no concerning adverse effects except for mild fatigue. He had repeat CT scan of the chest, abdomen and pelvis performed recently.  I personally and independently reviewed the scan and discussed the result with the patient today. His scan showed no concerning findings for disease progression. I recommended for him to continue with his maintenance treatment with Alimta and he will proceed with cycle #15 today. For the  hydronephrosis, he is followed by urology and he had ureteral stent placed few days ago. For the swelling of the face and upper extremity, he was advised to reach out to his primary care physician for consideration of treatment with Lasix if no improvement after the stent placement. The patient was advised to call immediately if he has any other concerning symptoms in the interval. The patient voices understanding of current disease status and treatment options and is in agreement with the current care plan. All questions were answered. The patient knows to call the clinic with any problems, questions or concerns. We can certainly see the patient much sooner if necessary.  Disclaimer: This note was dictated with voice recognition software. Similar sounding words can inadvertently be transcribed and may not be corrected upon review.

## 2021-10-21 ENCOUNTER — Telehealth (HOSPITAL_COMMUNITY): Payer: Self-pay

## 2021-10-21 LAB — T4: T4, Total: 9.8 ug/dL (ref 4.5–12.0)

## 2021-10-21 NOTE — Telephone Encounter (Signed)
Called patient to see if he is interested in the Pulmonary Rehab Program. Patient expressed interest. Explained scheduling process, patient verbalized understanding. ?

## 2021-10-23 ENCOUNTER — Emergency Department (HOSPITAL_COMMUNITY)
Admission: EM | Admit: 2021-10-23 | Discharge: 2021-10-24 | Disposition: A | Discharge status: Home / Self Care | Payer: No Typology Code available for payment source | Source: Home / Self Care | Attending: Emergency Medicine | Admitting: Emergency Medicine

## 2021-10-23 ENCOUNTER — Encounter (HOSPITAL_COMMUNITY): Payer: Self-pay | Admitting: Urology

## 2021-10-23 ENCOUNTER — Encounter (HOSPITAL_COMMUNITY): Payer: Self-pay

## 2021-10-23 ENCOUNTER — Other Ambulatory Visit: Payer: Self-pay

## 2021-10-23 ENCOUNTER — Emergency Department (HOSPITAL_COMMUNITY): Payer: No Typology Code available for payment source

## 2021-10-23 DIAGNOSIS — Z85118 Personal history of other malignant neoplasm of bronchus and lung: Secondary | ICD-10-CM | POA: Insufficient documentation

## 2021-10-23 DIAGNOSIS — R079 Chest pain, unspecified: Secondary | ICD-10-CM | POA: Insufficient documentation

## 2021-10-23 DIAGNOSIS — I2510 Atherosclerotic heart disease of native coronary artery without angina pectoris: Secondary | ICD-10-CM | POA: Diagnosis not present

## 2021-10-23 DIAGNOSIS — J449 Chronic obstructive pulmonary disease, unspecified: Secondary | ICD-10-CM | POA: Insufficient documentation

## 2021-10-23 DIAGNOSIS — R509 Fever, unspecified: Secondary | ICD-10-CM | POA: Insufficient documentation

## 2021-10-23 DIAGNOSIS — N131 Hydronephrosis with ureteral stricture, not elsewhere classified: Secondary | ICD-10-CM | POA: Diagnosis not present

## 2021-10-23 DIAGNOSIS — F17210 Nicotine dependence, cigarettes, uncomplicated: Secondary | ICD-10-CM | POA: Insufficient documentation

## 2021-10-23 DIAGNOSIS — Z7901 Long term (current) use of anticoagulants: Secondary | ICD-10-CM | POA: Diagnosis not present

## 2021-10-23 DIAGNOSIS — Z9221 Personal history of antineoplastic chemotherapy: Secondary | ICD-10-CM | POA: Diagnosis not present

## 2021-10-23 DIAGNOSIS — C771 Secondary and unspecified malignant neoplasm of intrathoracic lymph nodes: Secondary | ICD-10-CM | POA: Diagnosis not present

## 2021-10-23 DIAGNOSIS — I700 Atherosclerosis of aorta: Secondary | ICD-10-CM | POA: Diagnosis not present

## 2021-10-23 DIAGNOSIS — F17220 Nicotine dependence, chewing tobacco, uncomplicated: Secondary | ICD-10-CM | POA: Diagnosis not present

## 2021-10-23 DIAGNOSIS — C3490 Malignant neoplasm of unspecified part of unspecified bronchus or lung: Secondary | ICD-10-CM | POA: Diagnosis not present

## 2021-10-23 DIAGNOSIS — Z923 Personal history of irradiation: Secondary | ICD-10-CM | POA: Diagnosis not present

## 2021-10-23 DIAGNOSIS — K219 Gastro-esophageal reflux disease without esophagitis: Secondary | ICD-10-CM | POA: Diagnosis not present

## 2021-10-23 DIAGNOSIS — J439 Emphysema, unspecified: Secondary | ICD-10-CM | POA: Diagnosis not present

## 2021-10-23 DIAGNOSIS — R0602 Shortness of breath: Secondary | ICD-10-CM | POA: Insufficient documentation

## 2021-10-23 DIAGNOSIS — Z86711 Personal history of pulmonary embolism: Secondary | ICD-10-CM | POA: Diagnosis not present

## 2021-10-23 DIAGNOSIS — J479 Bronchiectasis, uncomplicated: Secondary | ICD-10-CM | POA: Diagnosis not present

## 2021-10-23 DIAGNOSIS — J90 Pleural effusion, not elsewhere classified: Secondary | ICD-10-CM | POA: Diagnosis not present

## 2021-10-23 DIAGNOSIS — E785 Hyperlipidemia, unspecified: Secondary | ICD-10-CM | POA: Diagnosis not present

## 2021-10-23 LAB — CBC WITH DIFFERENTIAL/PLATELET
Abs Immature Granulocytes: 0.02 10*3/uL (ref 0.00–0.07)
Basophils Absolute: 0.1 10*3/uL (ref 0.0–0.1)
Basophils Relative: 1 %
Eosinophils Absolute: 0.1 10*3/uL (ref 0.0–0.5)
Eosinophils Relative: 1 %
HCT: 39 % (ref 39.0–52.0)
Hemoglobin: 13.2 g/dL (ref 13.0–17.0)
Immature Granulocytes: 0 %
Lymphocytes Relative: 9 %
Lymphs Abs: 0.6 10*3/uL — ABNORMAL LOW (ref 0.7–4.0)
MCH: 33.2 pg (ref 26.0–34.0)
MCHC: 33.8 g/dL (ref 30.0–36.0)
MCV: 98.2 fL (ref 80.0–100.0)
Monocytes Absolute: 0.2 10*3/uL (ref 0.1–1.0)
Monocytes Relative: 3 %
Neutro Abs: 5.9 10*3/uL (ref 1.7–7.7)
Neutrophils Relative %: 86 %
Platelets: 267 10*3/uL (ref 150–400)
RBC: 3.97 MIL/uL — ABNORMAL LOW (ref 4.22–5.81)
RDW: 14 % (ref 11.5–15.5)
WBC: 6.9 10*3/uL (ref 4.0–10.5)
nRBC: 0 % (ref 0.0–0.2)

## 2021-10-23 LAB — COMPREHENSIVE METABOLIC PANEL
ALT: 24 U/L (ref 0–44)
AST: 35 U/L (ref 15–41)
Albumin: 3.4 g/dL — ABNORMAL LOW (ref 3.5–5.0)
Alkaline Phosphatase: 97 U/L (ref 38–126)
Anion gap: 10 (ref 5–15)
BUN: 17 mg/dL (ref 8–23)
CO2: 23 mmol/L (ref 22–32)
Calcium: 9.1 mg/dL (ref 8.9–10.3)
Chloride: 105 mmol/L (ref 98–111)
Creatinine, Ser: 1.02 mg/dL (ref 0.61–1.24)
GFR, Estimated: >60 mL/min (ref >60)
Glucose, Bld: 118 mg/dL — ABNORMAL HIGH (ref 70–99)
Potassium: 4.3 mmol/L (ref 3.5–5.1)
Sodium: 138 mmol/L (ref 135–145)
Total Bilirubin: 0.6 mg/dL (ref 0.3–1.2)
Total Protein: 7.1 g/dL (ref 6.5–8.1)

## 2021-10-23 LAB — PROTIME-INR
INR: 1.1 (ref 0.8–1.2)
Prothrombin Time: 14.1 seconds (ref 11.4–15.2)

## 2021-10-23 LAB — TROPONIN I (HIGH SENSITIVITY): Troponin I (High Sensitivity): 9 ng/L (ref <18)

## 2021-10-23 LAB — APTT: aPTT: 35 seconds (ref 24–36)

## 2021-10-23 MED ORDER — HYDROMORPHONE HCL 1 MG/ML IJ SOLN
1.0000 mg | Freq: Once | INTRAMUSCULAR | Status: AC
  Administered 2021-10-23: 1 mg via INTRAVENOUS
  Filled 2021-10-23: qty 1

## 2021-10-23 MED ORDER — IOHEXOL 350 MG/ML SOLN
80.0000 mL | Freq: Once | INTRAVENOUS | Status: AC | PRN
  Administered 2021-10-23: 80 mL via INTRAVENOUS

## 2021-10-23 NOTE — ED Provider Notes (Signed)
[facility] Emergency Department Provider Note MRN:  [id number]  Arrival date & time: 10/24/21     Chief Complaint   Chest Pain and Shortness of Breath   History of Present Illness   Philip [name] is a 66 y.o. year-old male with a history of lung cancer presenting to the ED with chief complaint of chest pain and shortness of breath.  Sharp left upper chest pain starting earlier today, associated with shortness of breath.  Feels very similar to his last blood clot.  He stopped his blood thinner 2 days ago in preparation for a urology procedure this week.  Denies fever, no cough, no other complaints.  Review of Systems  A thorough review of systems was obtained and all systems are negative except as noted in the HPI and PMH.   Patient's Health History    Past Medical History:  Diagnosis Date   COPD (chronic obstructive pulmonary disease) (HCC)    GERD (gastroesophageal reflux disease)    Headache    migraines   Hyperlipidemia    lung ca dx'd 12/2018   Pneumonia    Pulmonary embolus ([place])    Tobacco abuse     Past Surgical History:  Procedure Laterality Date   COLONOSCOPY     HAND SURGERY     VIDEO BRONCHOSCOPY WITH ENDOBRONCHIAL ULTRASOUND N/A 12/22/2018   Procedure: VIDEO BRONCHOSCOPY WITH ENDOBRONCHIAL ULTRASOUND;  Surgeon: Garner Nash, DO;  Location: [facility];  Service: Thoracic;  Laterality: N/A;   VIDEO BRONCHOSCOPY WITH ENDOBRONCHIAL ULTRASOUND N/A 01/09/2019   Procedure: VIDEO BRONCHOSCOPY WITH ENDOBRONCHIAL ULTRASOUND WITH FLUORO;  Surgeon: Garner Nash, DO;  Location: [facility];  Service: Thoracic;  Laterality: N/A;   VIDEO BRONCHOSCOPY WITH RADIAL ENDOBRONCHIAL ULTRASOUND N/A 12/22/2018   Procedure: RADIAL ENDOBRONCHIAL ULTRASOUND;  Surgeon: Garner Nash, DO;  Location: [facility];  Service: Thoracic;  Laterality: N/A;    Family History  Problem Relation Age of Onset   Cancer Father    Stomach cancer Father     Social History    Socioeconomic History   Marital status: Married    Spouse name: Not on file   Number of children: Not on file   Years of education: Not on file   Highest education level: Not on file  Occupational History   Not on file  Tobacco Use   Smoking status: Light Smoker    Years: 45.00    Types: Cigarettes    Start date: 07/04/1970   Smokeless tobacco: Former    Types: Chew   Tobacco comments:    Smoking 2 packs of cigarettes a week. 10/14/2021 [place]    Smoking is on and off, will go a week without smoking then start up and smoke half a pack a day  Vaping Use   Vaping Use: Never used  Substance and Sexual Activity   Alcohol use: Yes    Comment: hasn't drank in one month   Drug use: Not Currently    Types: Marijuana    Comment: rare edible use   Sexual activity: Not on file  Other Topics Concern   Not on file  Social History Narrative   Not on file   Social Determinants of Health   Financial Resource Strain: Not on file  Food Insecurity: Not on file  Transportation Needs: Not on file  Physical Activity: Not on file  Stress: Not on file  Social Connections: Not on file  Intimate Partner Violence: Not on file  Physical Exam   Vitals:   10/24/21 0130 10/24/21 0200  BP: 105/79 107/88  Pulse: 98 95  Resp: (!) 23 19  Temp:    SpO2: 90% 92%    CONSTITUTIONAL: Well-appearing, NAD NEURO/PSYCH:  Alert and oriented x 3, no focal deficits EYES:  eyes equal and reactive ENT/NECK:  no LAD, no JVD CARDIO: Regular rate, well-perfused, normal S1 and S2 PULM:  CTAB no wheezing or rhonchi GI/GU:  non-distended, non-tender MSK/SPINE:  No gross deformities, no edema SKIN:  no rash, atraumatic   *Additional and/or pertinent findings included in MDM below  Diagnostic and Interventional Summary    EKG Interpretation  Date/Time:  Thursday October 23 2021 22:49:54 EDT Ventricular Rate:  108 PR Interval:  145 QRS Duration: 81 QT Interval:  324 QTC Calculation: 433 R  Axis:   268 Text Interpretation: Sinus tachycardia Left anterior fascicular block Probable right ventricular hypertrophy Nonspecific T abnormalities, lateral leads Confirmed by Gerlene Fee [phone]) on 10/23/2021 11:03:48 PM       Labs Reviewed  COMPREHENSIVE METABOLIC PANEL - Abnormal; Notable for the following components:      Result Value   Glucose, Bld 118 (*)    Albumin 3.4 (*)    All other components within normal limits  CBC WITH DIFFERENTIAL/PLATELET - Abnormal; Notable for the following components:   RBC 3.97 (*)    Lymphs Abs 0.6 (*)    All other components within normal limits  URINALYSIS, ROUTINE W REFLEX MICROSCOPIC - Abnormal; Notable for the following components:   Color, Urine STRAW (*)    All other components within normal limits  CULTURE, BLOOD (ROUTINE X 2)  CULTURE, BLOOD (ROUTINE X 2)  URINE CULTURE  LACTIC ACID, PLASMA  PROTIME-INR  APTT  BRAIN NATRIURETIC PEPTIDE  LACTIC ACID, PLASMA  TROPONIN I (HIGH SENSITIVITY)  TROPONIN I (HIGH SENSITIVITY)    CT Angio Chest Pulmonary Embolism (PE) W or WO Contrast  Final Result    DG Chest Port 1 View  Final Result      Medications  HYDROmorphone (DILAUDID) injection 1 mg (1 mg Intravenous Given 10/23/21 2340)  iohexol (OMNIPAQUE) 350 MG/ML injection 80 mL (80 mLs Intravenous Contrast Given 10/23/21 2352)     Procedures  /  Critical Care Procedures  ED Course and Medical Decision Making  Initial Impression and Ddx Differential diagnosis includes PE, ACS, pneumothorax, complication or increased cancer/tumor burden, pneumonia awaiting labs, CT.  Patient 89% on room air on arrival, currently on 2 L nasal cannula.  Explains that his baseline is normally 92%  Past medical/surgical history that increases complexity of ED encounter: Lung cancer  Interpretation of Diagnostics I personally reviewed the EKG and my interpretation is as follows: Sinus rhythm, nonspecific findings  Labs reveal no significant blood  count or electrolyte disturbance, troponin negative x2.  Chest x-ray and CT are without acute findings.  Patient Reassessment and Ultimate Disposition/Management     Patient is on room air with baseline oxygen saturation of 92%.  Still having some mild sharp pain but not significant.  Doubt cardiac cause.  Possibly related to underlying cancer or postop radiation symptoms.  No indication for further testing or admission, appropriate for discharge.  Patient management required discussion with the following services or consulting groups:  None  Complexity of Problems Addressed Acute illness or injury that poses threat of life of bodily function  Additional Data Reviewed and Analyzed Further history obtained from: Further history from spouse/family member  Additional Factors Impacting ED Encounter  Risk Consideration of hospitalization  Barth Kirks. Sedonia Small, MD [place] [email or web address]  Final Clinical Impressions(s) / ED Diagnoses     ICD-10-CM   1. Chest pain, unspecified type  R07.9       ED Discharge Orders     None        Discharge Instructions Discussed with and Provided to Patient:     Discharge Instructions      You were evaluated in the Emergency Department and after careful evaluation, we did not find any emergent condition requiring admission or further testing in the hospital.  Your exam/testing today was overall reassuring.  Recommend informing your doctors about your symptoms.  Please return to the Emergency Department if you experience any worsening of your condition.  Thank you for allowing [place] to be a part of your care.        Maudie Flakes, MD 10/24/21 [phone]

## 2021-10-23 NOTE — ED Triage Notes (Signed)
Pt c/o SOB and chest pain starting today. Pt has hx of PE. Pt stopped taking his Xarelto 2 days ago per provider due to a procedure scheduled for tomorrow. Pt currently receiving treatment for lung cancer.

## 2021-10-23 NOTE — ED Notes (Signed)
Need labs

## 2021-10-24 ENCOUNTER — Ambulatory Visit (HOSPITAL_COMMUNITY): Payer: No Typology Code available for payment source | Admitting: Physician Assistant

## 2021-10-24 ENCOUNTER — Ambulatory Visit (HOSPITAL_COMMUNITY): Payer: No Typology Code available for payment source

## 2021-10-24 ENCOUNTER — Ambulatory Visit (HOSPITAL_BASED_OUTPATIENT_CLINIC_OR_DEPARTMENT_OTHER): Payer: No Typology Code available for payment source | Admitting: Anesthesiology

## 2021-10-24 ENCOUNTER — Other Ambulatory Visit: Payer: Self-pay

## 2021-10-24 ENCOUNTER — Encounter (HOSPITAL_COMMUNITY)
Admission: RE | Disposition: A | Discharge status: Home / Self Care | Payer: Self-pay | Source: Home / Self Care | Attending: Urology

## 2021-10-24 ENCOUNTER — Encounter (HOSPITAL_COMMUNITY): Payer: Self-pay | Admitting: Urology

## 2021-10-24 ENCOUNTER — Ambulatory Visit (HOSPITAL_COMMUNITY)
Admission: RE | Admit: 2021-10-24 | Discharge: 2021-10-24 | Disposition: A | Discharge status: Home / Self Care | Payer: No Typology Code available for payment source | Attending: Urology | Admitting: Urology

## 2021-10-24 DIAGNOSIS — Z9221 Personal history of antineoplastic chemotherapy: Secondary | ICD-10-CM | POA: Insufficient documentation

## 2021-10-24 DIAGNOSIS — F17210 Nicotine dependence, cigarettes, uncomplicated: Secondary | ICD-10-CM

## 2021-10-24 DIAGNOSIS — Z7901 Long term (current) use of anticoagulants: Secondary | ICD-10-CM | POA: Insufficient documentation

## 2021-10-24 DIAGNOSIS — N135 Crossing vessel and stricture of ureter without hydronephrosis: Secondary | ICD-10-CM | POA: Diagnosis not present

## 2021-10-24 DIAGNOSIS — J439 Emphysema, unspecified: Secondary | ICD-10-CM | POA: Insufficient documentation

## 2021-10-24 DIAGNOSIS — J90 Pleural effusion, not elsewhere classified: Secondary | ICD-10-CM | POA: Insufficient documentation

## 2021-10-24 DIAGNOSIS — C3490 Malignant neoplasm of unspecified part of unspecified bronchus or lung: Secondary | ICD-10-CM | POA: Insufficient documentation

## 2021-10-24 DIAGNOSIS — K219 Gastro-esophageal reflux disease without esophagitis: Secondary | ICD-10-CM | POA: Insufficient documentation

## 2021-10-24 DIAGNOSIS — N1330 Unspecified hydronephrosis: Secondary | ICD-10-CM

## 2021-10-24 DIAGNOSIS — C771 Secondary and unspecified malignant neoplasm of intrathoracic lymph nodes: Secondary | ICD-10-CM | POA: Insufficient documentation

## 2021-10-24 DIAGNOSIS — N131 Hydronephrosis with ureteral stricture, not elsewhere classified: Secondary | ICD-10-CM | POA: Insufficient documentation

## 2021-10-24 DIAGNOSIS — I700 Atherosclerosis of aorta: Secondary | ICD-10-CM | POA: Insufficient documentation

## 2021-10-24 DIAGNOSIS — Z923 Personal history of irradiation: Secondary | ICD-10-CM | POA: Insufficient documentation

## 2021-10-24 DIAGNOSIS — F17220 Nicotine dependence, chewing tobacco, uncomplicated: Secondary | ICD-10-CM | POA: Insufficient documentation

## 2021-10-24 DIAGNOSIS — I2510 Atherosclerotic heart disease of native coronary artery without angina pectoris: Secondary | ICD-10-CM | POA: Insufficient documentation

## 2021-10-24 DIAGNOSIS — J449 Chronic obstructive pulmonary disease, unspecified: Secondary | ICD-10-CM | POA: Diagnosis not present

## 2021-10-24 DIAGNOSIS — J479 Bronchiectasis, uncomplicated: Secondary | ICD-10-CM | POA: Insufficient documentation

## 2021-10-24 DIAGNOSIS — E785 Hyperlipidemia, unspecified: Secondary | ICD-10-CM | POA: Insufficient documentation

## 2021-10-24 DIAGNOSIS — Z86711 Personal history of pulmonary embolism: Secondary | ICD-10-CM | POA: Insufficient documentation

## 2021-10-24 HISTORY — PX: CYSTOSCOPY W/ URETERAL STENT PLACEMENT: SHX1429

## 2021-10-24 LAB — URINALYSIS, ROUTINE W REFLEX MICROSCOPIC
Bilirubin Urine: NEGATIVE
Glucose, UA: NEGATIVE mg/dL
Hgb urine dipstick: NEGATIVE
Ketones, ur: NEGATIVE mg/dL
Leukocytes,Ua: NEGATIVE
Nitrite: NEGATIVE
Protein, ur: NEGATIVE mg/dL
Specific Gravity, Urine: 1.023 (ref 1.005–1.030)
pH: 5 (ref 5.0–8.0)

## 2021-10-24 LAB — BRAIN NATRIURETIC PEPTIDE: B Natriuretic Peptide: 86.8 pg/mL (ref 0.0–100.0)

## 2021-10-24 LAB — TROPONIN I (HIGH SENSITIVITY): Troponin I (High Sensitivity): 8 ng/L (ref <18)

## 2021-10-24 LAB — LACTIC ACID, PLASMA: Lactic Acid, Venous: 1.3 mmol/L (ref 0.5–1.9)

## 2021-10-24 SURGERY — CYSTOSCOPY, WITH RETROGRADE PYELOGRAM AND URETERAL STENT INSERTION
Anesthesia: General | Laterality: Right

## 2021-10-24 MED ORDER — ACETAMINOPHEN 500 MG PO TABS
1000.0000 mg | ORAL_TABLET | Freq: Once | ORAL | Status: AC
  Administered 2021-10-24: 1000 mg via ORAL
  Filled 2021-10-24: qty 2

## 2021-10-24 MED ORDER — PHENYLEPHRINE HCL (PRESSORS) 10 MG/ML IV SOLN
INTRAVENOUS | Status: DC | PRN
  Administered 2021-10-24: 240 ug via INTRAVENOUS
  Administered 2021-10-24: 120 ug via INTRAVENOUS
  Administered 2021-10-24: 200 ug via INTRAVENOUS
  Administered 2021-10-24: 240 ug via INTRAVENOUS

## 2021-10-24 MED ORDER — LACTATED RINGERS IV SOLN: INTRAVENOUS | Status: DC

## 2021-10-24 MED ORDER — IOHEXOL 300 MG/ML  SOLN
INTRAMUSCULAR | Status: DC | PRN
  Administered 2021-10-24: 7 mL

## 2021-10-24 MED ORDER — PROPOFOL 10 MG/ML IV BOLUS
INTRAVENOUS | Status: DC | PRN
  Administered 2021-10-24: 200 mg via INTRAVENOUS

## 2021-10-24 MED ORDER — LIDOCAINE HCL (CARDIAC) PF 100 MG/5ML IV SOSY
PREFILLED_SYRINGE | INTRAVENOUS | Status: DC | PRN
  Administered 2021-10-24: 60 mg via INTRAVENOUS

## 2021-10-24 MED ORDER — PROPOFOL 10 MG/ML IV BOLUS
INTRAVENOUS | Status: AC
  Filled 2021-10-24: qty 20

## 2021-10-24 MED ORDER — ONDANSETRON HCL 4 MG/2ML IJ SOLN
INTRAMUSCULAR | Status: DC | PRN
  Administered 2021-10-24: 4 mg via INTRAVENOUS

## 2021-10-24 MED ORDER — SODIUM CHLORIDE 0.9 % IR SOLN
Status: DC | PRN
  Administered 2021-10-24: 3000 mL

## 2021-10-24 MED ORDER — FENTANYL CITRATE (PF) 100 MCG/2ML IJ SOLN
INTRAMUSCULAR | Status: AC
  Filled 2021-10-24: qty 2

## 2021-10-24 MED ORDER — PROMETHAZINE HCL 25 MG/ML IJ SOLN: 6.2500 mg | INTRAMUSCULAR | Status: DC | PRN

## 2021-10-24 MED ORDER — CEFAZOLIN SODIUM-DEXTROSE 2-4 GM/100ML-% IV SOLN
2.0000 g | Freq: Once | INTRAVENOUS | Status: AC
  Administered 2021-10-24: 2 g via INTRAVENOUS
  Filled 2021-10-24: qty 100

## 2021-10-24 MED ORDER — MIDAZOLAM HCL 2 MG/2ML IJ SOLN
INTRAMUSCULAR | Status: AC
  Filled 2021-10-24: qty 2

## 2021-10-24 MED ORDER — CHLORHEXIDINE GLUCONATE 0.12 % MT SOLN
15.0000 mL | Freq: Once | OROMUCOSAL | Status: AC
  Administered 2021-10-24: 15 mL via OROMUCOSAL

## 2021-10-24 MED ORDER — DEXAMETHASONE SODIUM PHOSPHATE 4 MG/ML IJ SOLN
INTRAMUSCULAR | Status: DC | PRN
  Administered 2021-10-24: 4 mg via INTRAVENOUS

## 2021-10-24 MED ORDER — ORAL CARE MOUTH RINSE: 15.0000 mL | Freq: Once | OROMUCOSAL | Status: AC

## 2021-10-24 MED ORDER — EPHEDRINE 5 MG/ML INJ
INTRAVENOUS | Status: AC
  Filled 2021-10-24: qty 5

## 2021-10-24 MED ORDER — EPHEDRINE SULFATE-NACL 50-0.9 MG/10ML-% IV SOSY
PREFILLED_SYRINGE | INTRAVENOUS | Status: DC | PRN
  Administered 2021-10-24: 5 mg via INTRAVENOUS

## 2021-10-24 MED ORDER — IPRATROPIUM-ALBUTEROL 0.5-2.5 (3) MG/3ML IN SOLN
RESPIRATORY_TRACT | Status: AC
  Filled 2021-10-24: qty 3

## 2021-10-24 MED ORDER — LIDOCAINE HCL (PF) 2 % IJ SOLN
INTRAMUSCULAR | Status: AC
  Filled 2021-10-24: qty 5

## 2021-10-24 MED ORDER — AMISULPRIDE (ANTIEMETIC) 5 MG/2ML IV SOLN: 10.0000 mg | Freq: Once | INTRAVENOUS | Status: DC | PRN

## 2021-10-24 MED ORDER — FENTANYL CITRATE PF 50 MCG/ML IJ SOSY: 25.0000 ug | PREFILLED_SYRINGE | INTRAMUSCULAR | Status: DC | PRN

## 2021-10-24 MED ORDER — IPRATROPIUM-ALBUTEROL 0.5-2.5 (3) MG/3ML IN SOLN
3.0000 mL | Freq: Once | RESPIRATORY_TRACT | Status: AC
  Administered 2021-10-24: 3 mL via RESPIRATORY_TRACT

## 2021-10-24 MED ORDER — FENTANYL CITRATE (PF) 100 MCG/2ML IJ SOLN
INTRAMUSCULAR | Status: DC | PRN
  Administered 2021-10-24: 100 ug via INTRAVENOUS

## 2021-10-24 MED ORDER — MIDAZOLAM HCL 5 MG/5ML IJ SOLN
INTRAMUSCULAR | Status: DC | PRN
  Administered 2021-10-24: 2 mg via INTRAVENOUS

## 2021-10-24 SURGICAL SUPPLY — 14 items
BAG URO CATCHER STRL LF (MISCELLANEOUS) ×1 IMPLANT
CATH URETL OPEN 5X70 (CATHETERS) IMPLANT
CLOTH BEACON ORANGE TIMEOUT ST (SAFETY) ×1 IMPLANT
GLOVE BIOGEL M 7.0 STRL (GLOVE) ×1 IMPLANT
GOWN STRL REUS W/ TWL XL LVL3 (GOWN DISPOSABLE) ×1 IMPLANT
GOWN STRL REUS W/TWL XL LVL3 (GOWN DISPOSABLE) ×1
GUIDEWIRE STR DUAL SENSOR (WIRE) ×1 IMPLANT
GUIDEWIRE ZIPWRE .038 STRAIGHT (WIRE) IMPLANT
MANIFOLD NEPTUNE II (INSTRUMENTS) ×1 IMPLANT
PACK CYSTO (CUSTOM PROCEDURE TRAY) ×1 IMPLANT
STENT URET 6FRX26 CONTOUR (STENTS) IMPLANT
SYR 10ML LL (SYRINGE) ×1 IMPLANT
TUBING CONNECTING 10 (TUBING) ×1 IMPLANT
TUBING UROLOGY SET (TUBING) IMPLANT

## 2021-10-24 NOTE — Transfer of Care (Signed)
Immediate Anesthesia Transfer of Care Note  Patient: Philip [name]  Procedure(s) Performed: CYSTOSCOPY WITH RETROGRADE PYELOGRAM/URETERAL STENT PLACEMENT (Right)  Patient Location: PACU  Anesthesia Type:General  Level of Consciousness: awake, alert  and oriented  Airway & Oxygen Therapy: Patient Spontanous Breathing and Patient connected to face mask oxygen  Post-op Assessment: Report given to RN and Post -op Vital signs reviewed and stable  Post vital signs: Reviewed and stable  Last Vitals:  Vitals Value Taken Time  BP 100/78 10/24/21 1422  Temp    Pulse 87 10/24/21 1424  Resp 21 10/24/21 1424  SpO2 100 % 10/24/21 1424  Vitals shown include unvalidated device data.  Last Pain:  Vitals:   10/24/21 1217  TempSrc:   PainSc: 0-No pain      Patients Stated Pain Goal: 3 (17/40/81 4481)  Complications: No notable events documented.

## 2021-10-24 NOTE — Op Note (Signed)
Operative Note  Preoperative diagnosis:  1.  Right hydronephrosis 2. Metastatic lung cancer  Postoperative diagnosis: 1.  Right UPJ obstruction 2. Metastatic lung cancer  Procedure(s): 1.  Cystoscopy 2. Right retrograde pyelogram with interpretation 3. Right ureteral stent placement 4. Fluoroscopy <1 hour with intraoperative interpretation  Surgeon: Rexene Alberts, MD  Assistants:  None  Anesthesia:  General  Complications:  None  EBL:  Minimal  Specimens: None  Drains/Catheters: 1.  Right 6Fr x 26cm ureteral stent  Intraoperative findings:   Cystoscopy demonstrated no suspicious lesions, masses, stones or other pathology. Right retrograde pyelogram demonstrated severe right hydronephrosis at the level of the right UPJ consistent with a UPJ obstruction. Successful right ureteral stent placement with curl in the renal pelvis and bladder respectively.  Indication:  Philip [name] is a 66 y.o. male with a right UPJ obstruction. He does have a history of metastatic lung cancer and continues to undergo chemotherapy. After reviewing the management options for treatment, he elected to proceed with the above surgical procedure(s). We have discussed the potential benefits and risks of the procedure, side effects of the proposed treatment, the likelihood of the patient achieving the goals of the procedure, and any potential problems that might occur during the procedure or recuperation. Informed consent has been obtained.  Description of procedure: The patient was taken to the operating room and general anesthesia was induced.  The patient was placed in the dorsal lithotomy position, prepped and draped in the usual sterile fashion, and preoperative antibiotics were administered. A preoperative time-out was performed.   Cystourethroscopy was performed.  The patient's urethra was examined and was normal. There was some bilobar prostatic hypertrophy. The bladder was then systematically  examined in its entirety. There was no evidence for any bladder tumors, stones, or other mucosal pathology.    Attention then turned to the right ureteral orifice. A 0.038 zip wire was passed through the right ureteral orifice and over the wire a 5 Fr open ended catheter was inserted and passed up to the level of the renal pelvis. Omnipaque contrast was injected through the ureteral catheter and a retrograde pyelogram was performed with findings as dictated above. The wire was then replaced and the open ended catheter was removed.   A 6Fr x 26cm ureteral stent was advance over the wire. The stent was positioned appropriately under fluoroscopic and cystoscopic guidance.  The wire was then removed with an adequate stent curl noted in the renal pelvis as well as in the bladder.  The bladder was then emptied and the procedure ended.  The patient appeared to tolerate the procedure well and without complications.  The patient was able to be awakened and transferred to the recovery unit in satisfactory condition.   [name]  Pager: [phone]

## 2021-10-24 NOTE — Discharge Instructions (Signed)
[facility] [phone] Post Stent Instructions  Definitions:  Ureter: The duct that transports urine from the kidney to the bladder. Stent:   A plastic hollow tube that is placed into the ureter, from the kidney to the bladder to prevent the ureter from swelling shut.  GENERAL INSTRUCTIONS:  Despite the fact that no skin incisions were used, the area around the ureter and bladder is raw and irritated. The stent is a foreign body which will further irritate the bladder wall. This irritation is manifested by increased frequency of urination, both day and night, and by an increase in the urge to urinate. In some, the urge to urinate is present almost always. Sometimes the urge is strong enough that you may not be able to stop yourself from urinating. The only real cure is to remove the stent and then give time for the bladder wall to heal which can't be done until the danger of the ureter swelling shut has passed, which varies.  You may see some blood in your urine while the stent is in place and a few days afterwards. Do not be alarmed, even if the urine was clear for a while. Get off your feet and drink lots of fluids until clearing occurs. If you start to pass clots or don't improve, call us.  DIET: You may return to your normal diet immediately. Because of the raw surface of your bladder, alcohol, spicy foods, acid type foods and drinks with caffeine may cause irritation or frequency and should be used in moderation. To keep your urine flowing freely and to avoid constipation, drink plenty of fluids during the day ( 8-10 glasses ). Tip: Avoid cranberry juice because it is very acidic.  ACTIVITY: Your physical activity doesn't need to be restricted. However, if you are very active, you may see some blood in your urine. We suggest that you reduce your activity under these circumstances until the bleeding has stopped.  BOWELS: It is important to keep your bowels regular during  the postoperative period. Straining with bowel movements can cause bleeding. A bowel movement every other day is reasonable. Use a mild laxative if needed, such as Milk of Magnesia 2-3 tablespoons, or 2 Dulcolax tablets. Call if you continue to have problems. If you have been taking narcotics for pain, before, during or after your surgery, you may be constipated. Take a laxative if necessary.   MEDICATION: You should resume your pre-surgery medications unless told not to. In addition you will often be given an antibiotic to prevent infection. These should be taken as prescribed until the bottles are finished unless you are having an unusual reaction to one of the drugs.  PROBLEMS YOU SHOULD REPORT TO [place]: Fevers over 100.5 Fahrenheit. Heavy bleeding, or clots ( See above notes about blood in urine ). Inability to urinate. Drug reactions ( hives, rash, nausea, vomiting, diarrhea ). Severe burning or pain with urination that is not improving.  FOLLOW-UP: You will need a follow-up appointment to monitor your progress. Call for this appointment at the number listed above. Usually the first appointment will be about three to fourteen days after your surgery.

## 2021-10-24 NOTE — Addendum Note (Signed)
Addendum  created 10/24/21 1516 by Pervis Hocking, DO   Order list changed, Pharmacy for encounter modified

## 2021-10-24 NOTE — Anesthesia Preprocedure Evaluation (Addendum)
Anesthesia Evaluation  Patient identified by MRN, date of birth, ID band Patient awake    Reviewed: Allergy & Precautions, NPO status , Patient's Chart, lab work & pertinent test results  History of Anesthesia Complications Negative for: history of anesthetic complications  Airway Mallampati: III  TM Distance: >3 FB Neck ROM: Full    Dental no notable dental hx. (+) Dental Advisory Given   Pulmonary COPD, Current Smoker and Patient abstained from smoking., PE Lung Ca   Pulmonary exam normal        Cardiovascular negative cardio ROS Normal cardiovascular exam     Neuro/Psych  Headaches, negative psych ROS   GI/Hepatic Neg liver ROS, GERD  Controlled,  Endo/Other  negative endocrine ROS  Renal/GU negative Renal ROS  negative genitourinary   Musculoskeletal negative musculoskeletal ROS (+)   Abdominal   Peds  Hematology negative hematology ROS (+)   Anesthesia Other Findings   Reproductive/Obstetrics negative OB ROS                           Anesthesia Physical Anesthesia Plan  ASA: 3  Anesthesia Plan: General   Post-op Pain Management: Tylenol PO (pre-op)* and Toradol IV (intra-op)*   Induction: Intravenous  PONV Risk Score and Plan: 2 and Ondansetron and Dexamethasone  Airway Management Planned: LMA  Additional Equipment: None  Intra-op Plan:   Post-operative Plan: Extubation in OR  Informed Consent: I have reviewed the patients History and Physical, chart, labs and discussed the procedure including the risks, benefits and alternatives for the proposed anesthesia with the patient or authorized representative who has indicated his/her understanding and acceptance.     Dental advisory given  Plan Discussed with: Anesthesiologist and CRNA  Anesthesia Plan Comments:        Anesthesia Quick Evaluation

## 2021-10-24 NOTE — H&P (Addendum)
CC/HPI: Philip [name] is a 66 year old male who is seen in consultation today for right hydronephrosis.   1. Right hydronephrosis:  -He has a history of recurrent non-small cell lung cancer who was initially diagnosed as stage IIIc (T4N3M0) who presented with a large right hilar mass with additional bilateral hilar and mediastinal lymphadenopathy diagnosed in 01/2019. He has had recurrence since 11/2020. He is on systemic chemotherapy and has undergone 10 cycles. He continues with chemotherapy.  -Incidentally found on CT C/A/P 08/16/2021 was moderate right hydronephrosis that was new compared to prior examination with abrupt change at the right ureteropelvic junction.  -Creatinine was 1.0 on 08/18/2021 equating to a GFR of 84.  -He denies history of urolithiasis or upper urinary tract instrumentation.  -He denies flank pain. He does note a right lower back pain that he attributes as chronic in nature.   Patient currently denies fever, chills, sweats, nausea, vomiting, abdominal or flank pain, gross hematuria or dysuria.   He has a past medical history of recurrent non-small cell lung cancer, pulmonary embolism, CAD, hyperlipidemia. He is on Xarelto for history of pulmonary embolism.     ALLERGIES: carboplatin Clonazapam Norco TABS    MEDICATIONS: Zyrtec  Albuterol Sulfate Hfa 90 mcg hfa aerosol with adapter  Alimta  Atorvastatin Calcium 20 mg tablet  Famotidine 20 mg tablet  Folic Acid  Mucinex  Multivitamin  Stiolo Respimat  Xarelto 20 mg tablet     GU PSH: None   NON-GU PSH: Bronchoscopy W/biopsy(s) - 2020     GU PMH: None   NON-GU PMH: Atherosclerosis of aorta Coronary Artery Disease Hypercholesterolemia Lung Cancer, History Pulmonary Embolism, History    FAMILY HISTORY: 3 daughters - Runs in Family Cancer - Runs in Family Dementia - Runs in Family   SOCIAL HISTORY: Marital Status: Married Ethnicity: Not Hispanic Or Latino; Race: White Current Smoking Status:  Patient smokes. Has smoked since 09/02/1976.   Tobacco Use Assessment Completed: Used Tobacco in last 30 days? Does drink.  Drinks 2 caffeinated drinks per day.    REVIEW OF SYSTEMS:    GU Review Male:   Patient reports get up at night to urinate. Patient denies frequent urination, hard to postpone urination, burning/ pain with urination, leakage of urine, stream starts and stops, trouble starting your stream, have to strain to urinate , erection problems, and penile pain.  Gastrointestinal (Upper):   Patient denies nausea, vomiting, and indigestion/ heartburn.  Gastrointestinal (Lower):   Patient denies diarrhea and constipation.  Constitutional:   Patient reports night sweats and fatigue. Patient denies fever and weight loss.  Skin:   Patient reports itching. Patient denies skin rash/ lesion.  Eyes:   Patient reports blurred vision. Patient denies double vision.  Ears/ Nose/ Throat:   Patient denies sore throat and sinus problems.  Hematologic/Lymphatic:   Patient reports easy bruising. Patient denies swollen glands.  Cardiovascular:   Patient reports leg swelling. Patient denies chest pains.  Respiratory:   Patient reports cough and shortness of breath.   Endocrine:   Patient reports excessive thirst.   Musculoskeletal:   Patient reports joint pain. Patient denies back pain.  Neurological:   Patient reports dizziness. Patient denies headaches.  Psychologic:   Patient reports anxiety. Patient denies depression.   VITAL SIGNS:      09/02/2021 08:39 AM  Weight 180 lb / 81.65 kg  Height 66 in / 167.64 cm  BP 115/84 mmHg  Pulse 99 /min  BMI 29.0 kg/m   MULTI-SYSTEM PHYSICAL EXAMINATION:  Constitutional: Well-nourished. No physical deformities. Normally developed. Good grooming.  Respiratory: No labored breathing, no use of accessory muscles.   Cardiovascular: Normal temperature, normal extremity pulses, no swelling, no varicosities.  Gastrointestinal: No mass, no tenderness, no  rigidity, non obese abdomen. No CVA tenderness     Complexity of Data:  Source Of History:  Patient, Medical Record Summary  Records Review:   Previous Doctor Records  Urine Test Review:   Urinalysis  X-Ray Review: C.T. Abdomen/Pelvis: Reviewed Films. Reviewed Report. Discussed With Patient.    Notes:                     CLINICAL DATA: Non-small cell lung cancer restaging, patient  reports leg swelling and bloating * Tracking Code: BO *   EXAM:  CT CHEST, ABDOMEN, AND PELVIS WITH CONTRAST   TECHNIQUE:  Multidetector CT imaging of the chest, abdomen and pelvis was  performed following the standard protocol during bolus  administration of intravenous contrast.   RADIATION DOSE REDUCTION: This exam was performed according to the  departmental dose-optimization program which includes automated  exposure control, adjustment of the mA and/or kV according to  patient size and/or use of iterative reconstruction technique.   CONTRAST: 181mL OMNIPAQUE IOHEXOL 300 MG/ML SOLN, additional oral  enteric contrast   COMPARISON: 06/12/2021   FINDINGS:  CT CHEST FINDINGS   Cardiovascular: Scattered aortic atherosclerosis. Normal heart size.  Left and right coronary artery calcifications. No pericardial  effusion. Unchanged, chronic occlusion of the right interlobar and  more distal pulmonary arteries (series 2, image 28).   Mediastinum/Nodes: Unchanged prominent subcentimeter mediastinal  lymph nodes. Saber sheath trachea (series 2, image 15). Thyroid  gland and esophagus demonstrate no significant findings.   Lungs/Pleura: Unchanged post treatment/post radiation appearance of  the chest, with dense bilateral perihilar consolidation and  fibrosis. Previously seen peripheral ground-glass airspace opacity  throughout the lungs is almost completely resolved (series 6, image  31, 89). Unchanged trace, loculated right pleural effusion. Moderate  to severe underlying centrilobular and  paraseptal emphysema.  Unchanged elevation of the right hemidiaphragm.   Musculoskeletal: No chest wall mass or suspicious osseous lesions  identified.   CT ABDOMEN PELVIS FINDINGS   Hepatobiliary: No solid liver abnormality is seen. No gallstones,  gallbladder wall thickening, or biliary dilatation.   Pancreas: Unremarkable. No pancreatic ductal dilatation or  surrounding inflammatory changes.   Spleen: Normal in size without significant abnormality.   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  Moderate right hydronephrosis, new compared to prior examination,  with abrupt caliber change at the ureteropelvic junction (series 7,  image 18, series 4, image 84).   Stomach/Bowel: Stomach is within normal limits. Appendix appears  normal. No evidence of bowel wall thickening, distention, or  inflammatory changes.   Vascular/Lymphatic: Aortic atherosclerosis. No enlarged abdominal or  pelvic lymph nodes.   Reproductive: No mass or other abnormality.   Other: No abdominal wall hernia or abnormality. No ascites.   Musculoskeletal: No acute osseous findings.   IMPRESSION:  1. Unchanged post treatment/post radiation appearance of the chest,  with dense bilateral perihilar consolidation and fibrosis. Unchanged  trace, loculated right pleural effusion.  2. Unchanged prominent mediastinal lymph nodes.  3. Previously seen peripheral ground-glass airspace opacity  throughout the lungs is almost completely resolved, consistent with  resolution of nonspecific infection or inflammation.  4. No evidence of lymphadenopathy or metastatic disease in the  abdomen or pelvis.  5. Moderate right hydronephrosis, new compared to prior examination,  with abrupt caliber change at the ureteropelvic junction. Findings  suggest ureteropelvic junction stricture. Functional significance  may be further assessed  by nuclear scintigraphic Lasix renogram if  desired.  6. Emphysema.  7. Coronary artery disease.   Aortic Atherosclerosis (ICD10-I70.0).    Electronically Signed  By: Delanna Ahmadi M.D.  On: 08/16/2021 14:26   PROCEDURES:          Urinalysis Dipstick Dipstick Cont'd  Color: Yellow Bilirubin: Neg mg/dL  Appearance: Clear Ketones: Neg mg/dL  Specific Gravity: 1.015 Blood: Neg ery/uL  pH: 5.5 Protein: Neg mg/dL  Glucose: Neg mg/dL Urobilinogen: 0.2 mg/dL    Nitrites: Neg    Leukocyte Esterase: Neg leu/uL    ASSESSMENT:      ICD-10 Details  1 GU:   Hydronephrosis - N13.0    PLAN:           Orders X-Rays: Lasix Renogram. No Oral Contrast          Schedule         Document Letter(s):  Created for Patient: Clinical Summary         Notes:   1. Right hydronephrosis:  -I reviewed CT imaging and recent renal function.  -I recommend Lasix renal scan to evaluate for obstruction. I will plan to call with results.  -If evidence of obstruction, I outlined several options for management including robotic pyeloplasty, chronic ureteral stent exchanges or percutaneous nephrostomy tube.  -Given that he is receiving chemotherapy for recurrent non-small cell lung cancer, he favors a more conservative approach with chronic ureteral stenting if this would be required. We discussed that since will be exchanged every 6 months.   CC: [name], MD   Urology Preoperative H&P   Chief Complaint: Right hydronephrosis  History of Present Illness: Philip [name] is a 66 y.o. male with right hydronephrosis, here for cystoscopy, R RPG, R stent placement. He had some chest pain last night and presented to the ED and had a workup that was negative and discharged home. He declines chest pain today.  Past Medical History:  Diagnosis Date   COPD (chronic obstructive pulmonary disease) (HCC)    GERD (gastroesophageal reflux disease)    Headache    migraines   Hyperlipidemia    lung ca  dx'd 12/2018   Pneumonia    Pulmonary embolus (HCC)    Tobacco abuse     Past Surgical History:  Procedure Laterality Date   COLONOSCOPY     HAND SURGERY     VIDEO BRONCHOSCOPY WITH ENDOBRONCHIAL ULTRASOUND N/A 12/22/2018   Procedure: VIDEO BRONCHOSCOPY WITH ENDOBRONCHIAL ULTRASOUND;  Surgeon: Garner Nash, DO;  Location: [place];  Service: Thoracic;  Laterality: N/A;   VIDEO BRONCHOSCOPY WITH ENDOBRONCHIAL ULTRASOUND N/A 01/09/2019   Procedure: VIDEO BRONCHOSCOPY WITH ENDOBRONCHIAL ULTRASOUND WITH FLUORO;  Surgeon: Garner Nash, DO;  Location: [place];  Service: Thoracic;  Laterality: N/A;   VIDEO BRONCHOSCOPY WITH RADIAL ENDOBRONCHIAL ULTRASOUND N/A 12/22/2018   Procedure: RADIAL ENDOBRONCHIAL ULTRASOUND;  Surgeon: Garner Nash, DO;  Location: [place];  Service: Thoracic;  Laterality: N/A;    Allergies:  Allergies  Allergen Reactions   Carboplatin Anaphylaxis, Shortness Of Breath, Cough and Hypertension    On dose 8. Became short of breath. Symptom management was called. Received benadryl, solumedrol, Pepcid and fluids. Medication discontinued.    Klonopin [Clonazepam] Other (See Comments)    nervous   Norco [Hydrocodone-Acetaminophen]  Other (See Comments)    Made pt feel jittery     Family History  Problem Relation Age of Onset   Cancer Father    Stomach cancer Father     Social History:  reports that he has been smoking cigarettes. He started smoking about 51 years ago. He has quit using smokeless tobacco.  His smokeless tobacco use included chew. He reports current alcohol use. He reports that he does not currently use drugs after having used the following drugs: Marijuana.  ROS: A complete review of systems was performed.  All systems are negative except for pertinent findings as noted.  Physical Exam:  Vital signs in last 24 hours: Temp:  [98.3 F (36.8 C)] 98.3 F (36.8 C) (09/21 2244) Pulse Rate:  [95-124] 95 (09/22 0200) Resp:  [16-27] 19 (09/22 0200) BP:  (105-118)/(77-88) 107/88 (09/22 0200) SpO2:  [88 %-98 %] 92 % (09/22 0200) Constitutional:  Alert and oriented, No acute distress Cardiovascular: Regular rate and rhythm Respiratory: Normal respiratory effort, Lungs clear bilaterally GI: Abdomen is soft, nontender, nondistended, no abdominal masses GU: No CVA tenderness Lymphatic: No lymphadenopathy Neurologic: Grossly intact, no focal deficits Psychiatric: Normal mood and affect  Laboratory Data:  Recent Labs    10/23/21 2250  WBC 6.9  HGB 13.2  HCT 39.0  PLT 267    Recent Labs    10/23/21 2250  NA 138  K 4.3  CL 105  GLUCOSE 118*  BUN 17  CALCIUM 9.1  CREATININE 1.02     Results for orders placed or performed during the hospital encounter of 10/23/21 (from the past 24 hour(s))  Lactic acid, plasma     Status: None   Collection Time: 10/23/21 10:48 PM  Result Value Ref Range   Lactic Acid, Venous 1.3 0.5 - 1.9 mmol/L  Comprehensive metabolic panel     Status: Abnormal   Collection Time: 10/23/21 10:50 PM  Result Value Ref Range   Sodium 138 135 - 145 mmol/L   Potassium 4.3 3.5 - 5.1 mmol/L   Chloride 105 98 - 111 mmol/L   CO2 23 22 - 32 mmol/L   Glucose, Bld 118 (H) 70 - 99 mg/dL   BUN 17 8 - 23 mg/dL   Creatinine, Ser 1.02 0.61 - 1.24 mg/dL   Calcium 9.1 8.9 - 10.3 mg/dL   Total Protein 7.1 6.5 - 8.1 g/dL   Albumin 3.4 (L) 3.5 - 5.0 g/dL   AST 35 15 - 41 U/L   ALT 24 0 - 44 U/L   Alkaline Phosphatase 97 38 - 126 U/L   Total Bilirubin 0.6 0.3 - 1.2 mg/dL   GFR, Estimated >60 >60 mL/min   Anion gap 10 5 - 15  CBC with Differential     Status: Abnormal   Collection Time: 10/23/21 10:50 PM  Result Value Ref Range   WBC 6.9 4.0 - 10.5 K/uL   RBC 3.97 (L) 4.22 - 5.81 MIL/uL   Hemoglobin 13.2 13.0 - 17.0 g/dL   HCT 39.0 39.0 - 52.0 %   MCV 98.2 80.0 - 100.0 fL   MCH 33.2 26.0 - 34.0 pg   MCHC 33.8 30.0 - 36.0 g/dL   RDW 14.0 11.5 - 15.5 %   Platelets 267 150 - 400 K/uL   nRBC 0.0 0.0 - 0.2 %    Neutrophils Relative % 86 %   Neutro Abs 5.9 1.7 - 7.7 K/uL   Lymphocytes Relative 9 %   Lymphs Abs 0.6 (L) 0.7 -  4.0 K/uL   Monocytes Relative 3 %   Monocytes Absolute 0.2 0.1 - 1.0 K/uL   Eosinophils Relative 1 %   Eosinophils Absolute 0.1 0.0 - 0.5 K/uL   Basophils Relative 1 %   Basophils Absolute 0.1 0.0 - 0.1 K/uL   Immature Granulocytes 0 %   Abs Immature Granulocytes 0.02 0.00 - 0.07 K/uL  Protime-INR     Status: None   Collection Time: 10/23/21 10:50 PM  Result Value Ref Range   Prothrombin Time 14.1 11.4 - 15.2 seconds   INR 1.1 0.8 - 1.2  APTT     Status: None   Collection Time: 10/23/21 10:50 PM  Result Value Ref Range   aPTT 35 24 - 36 seconds  Troponin I (High Sensitivity)     Status: None   Collection Time: 10/23/21 10:50 PM  Result Value Ref Range   Troponin I (High Sensitivity) 9 <18 ng/L  Brain natriuretic peptide     Status: None   Collection Time: 10/23/21 10:50 PM  Result Value Ref Range   B Natriuretic Peptide 86.8 0.0 - 100.0 pg/mL  Urinalysis, Routine w reflex microscopic Urine, Clean Catch     Status: Abnormal   Collection Time: 10/24/21  1:00 AM  Result Value Ref Range   Color, Urine STRAW (A) YELLOW   APPearance CLEAR CLEAR   Specific Gravity, Urine 1.023 1.005 - 1.030   pH 5.0 5.0 - 8.0   Glucose, UA NEGATIVE NEGATIVE mg/dL   Hgb urine dipstick NEGATIVE NEGATIVE   Bilirubin Urine NEGATIVE NEGATIVE   Ketones, ur NEGATIVE NEGATIVE mg/dL   Protein, ur NEGATIVE NEGATIVE mg/dL   Nitrite NEGATIVE NEGATIVE   Leukocytes,Ua NEGATIVE NEGATIVE  Troponin I (High Sensitivity)     Status: None   Collection Time: 10/24/21  1:40 AM  Result Value Ref Range   Troponin I (High Sensitivity) 8 <18 ng/L   No results found for this or any previous visit (from the past 240 hour(s)).  Renal Function: Recent Labs    10/20/21 1007 10/23/21 2250  CREATININE 1.03 1.02   Estimated Creatinine Clearance: 73.6 mL/min (by C-G formula based on SCr of 1.02  mg/dL).  Radiologic Imaging: CT Angio Chest Pulmonary Embolism (PE) W or WO Contrast  Result Date: 10/24/2021 CLINICAL DATA:  Chest pain and shortness of breath, history of lung carcinoma EXAM: CT ANGIOGRAPHY CHEST WITH CONTRAST TECHNIQUE: Multidetector CT imaging of the chest was performed using the standard protocol during bolus administration of intravenous contrast. Multiplanar CT image reconstructions and MIPs were obtained to evaluate the vascular anatomy. RADIATION DOSE REDUCTION: This exam was performed according to the departmental dose-optimization program which includes automated exposure control, adjustment of the mA and/or kV according to patient size and/or use of iterative reconstruction technique. CONTRAST:  54mL OMNIPAQUE IOHEXOL 350 MG/ML SOLN COMPARISON:  10/17/2021 FINDINGS: Cardiovascular: Thoracic aorta and its branches show no aneurysmal dilatation or dissection. No cardiac enlargement is seen. Pulmonary artery shows a normal branching pattern. No filling defect to suggest pulmonary embolism is noted. Mediastinum/Nodes: Thoracic inlet is within normal limits. No hilar or mediastinal adenopathy is noted. The esophagus is within normal limits. Lungs/Pleura: Emphysematous changes are noted particularly in the upper lobes. Areas of architectural distortion are again noted in the medial aspect of the right lower lobe as well as in the right upper lobe assistant with the given clinical history and related to underlying post treatment changes. Similar findings in the left lung are noted as well. No new focal  parenchymal density is seen. No sizable effusion is noted. Upper Abdomen: Visualized upper abdomen shows a stable left adrenal mass measuring up to 3.0 cm. This is stable and again consistent with adenoma. No other focal abnormality in the upper abdomen is seen. Musculoskeletal: No chest wall abnormality. No acute or significant osseous findings. Review of the MIP images confirms the above  findings. IMPRESSION: No evidence of pulmonary embolism. Stable left adrenal lesion dating back to 2020. No increased metabolic activity was noted on prior PET-CT. This is consistent with adenoma. No further follow-up is recommended. Areas of architectural distortion and consolidation with bronchiectasis consistent with post treatment change and stable from the prior exam. No acute abnormality noted. Electronically Signed   By: Inez Catalina M.D.   On: 10/24/2021 00:05   DG Chest Port 1 View  Result Date: 10/23/2021 CLINICAL DATA:  Mid chest pain, shortness of breath, lung cancer EXAM: PORTABLE CHEST 1 VIEW COMPARISON:  10/14/2021, 10/17/2021 FINDINGS: Single frontal view of the chest demonstrates a stable cardiac silhouette. Bilateral hilar scarring unchanged. Chronic elevation of the right hemidiaphragm. Low lung volumes. No acute airspace disease, effusion, or pneumothorax. Chronic bronchiectasis. No acute bony abnormalities. IMPRESSION: 1. Post radiation scarring in the hilar regions, stable. 2. Low lung volumes, with chronic elevation the right hemidiaphragm. 3. No acute airspace disease. Electronically Signed   By: Randa Ngo M.D.   On: 10/23/2021 23:17    I independently reviewed the above imaging studies.  Assessment and Plan ADRIANA LINA is a 66 y.o. male with right hydronephrosis, here for cystoscopy, R RPG, R stent placement.  -The risks, benefits and alternatives of cystoscopy with right JJ stent placement was discussed with the patient.  Risks include, but are not limited to: bleeding, urinary tract infection, ureteral injury, ureteral stricture disease, chronic pain, urinary symptoms, bladder injury, stent migration, the need for nephrostomy tube placement, MI, CVA, DVT, PE and the inherent risks with general anesthesia.  The patient voices understanding and wishes to proceed.     [name] R. Tobie Hellen MD 10/24/2021, 10:56 AM  [facility] Pager: [phone]): [phone]

## 2021-10-24 NOTE — Discharge Instructions (Signed)
You were evaluated in the Emergency Department and after careful evaluation, we did not find any emergent condition requiring admission or further testing in the hospital.  Your exam/testing today was overall reassuring.  Recommend informing your doctors about your symptoms.  Please return to the Emergency Department if you experience any worsening of your condition.  Thank you for allowing [place] to be a part of your care.

## 2021-10-24 NOTE — Anesthesia Postprocedure Evaluation (Signed)
Anesthesia Post Note  Patient: Philip [name]  Procedure(s) Performed: CYSTOSCOPY WITH RETROGRADE PYELOGRAM/URETERAL STENT PLACEMENT (Right)     Patient location during evaluation: PACU Anesthesia Type: General Level of consciousness: awake and alert, oriented and patient cooperative Pain management: pain level controlled Vital Signs Assessment: post-procedure vital signs reviewed and stable Respiratory status: spontaneous breathing, nonlabored ventilation and respiratory function stable Cardiovascular status: blood pressure returned to baseline and stable Postop Assessment: no apparent nausea or vomiting Anesthetic complications: no   No notable events documented.  Last Vitals:  Vitals:   10/24/21 1154  BP: 114/88  Pulse: 89  Resp: 19  Temp: (!) 36.4 C  SpO2: 94%    Last Pain:  Vitals:   10/24/21 1217  TempSrc:   PainSc: 0-No pain                 Pervis Hocking

## 2021-10-24 NOTE — Anesthesia Procedure Notes (Signed)
Procedure Name: LMA Insertion Date/Time: 10/24/2021 2:01 PM  Performed by: Lavina Hamman, CRNAPre-anesthesia Checklist: Patient identified, Emergency Drugs available, Suction available, Patient being monitored and Timeout performed Patient Re-evaluated:Patient Re-evaluated prior to induction Oxygen Delivery Method: Circle system utilized Preoxygenation: Pre-oxygenation with 100% oxygen Induction Type: IV induction Ventilation: Mask ventilation without difficulty LMA: LMA inserted LMA Size: 4.0 Tube size: 4.0 mm Number of attempts: 1 Placement Confirmation: positive ETCO2 and breath sounds checked- equal and bilateral Tube secured with: Tape Dental Injury: Teeth and Oropharynx as per pre-operative assessment

## 2021-10-25 LAB — URINE CULTURE: Culture: NO GROWTH

## 2021-10-27 ENCOUNTER — Encounter (HOSPITAL_COMMUNITY): Payer: Self-pay | Admitting: Urology

## 2021-10-29 ENCOUNTER — Encounter: Payer: Self-pay | Admitting: Physician Assistant

## 2021-10-29 ENCOUNTER — Encounter: Payer: Self-pay | Admitting: Internal Medicine

## 2021-10-29 LAB — CULTURE, BLOOD (ROUTINE X 2)
Culture: NO GROWTH
Culture: NO GROWTH
Special Requests: ADEQUATE
Special Requests: ADEQUATE

## 2021-10-30 ENCOUNTER — Encounter (HOSPITAL_COMMUNITY): Payer: Self-pay

## 2021-11-03 ENCOUNTER — Encounter: Payer: Self-pay | Admitting: Internal Medicine

## 2021-11-03 ENCOUNTER — Encounter: Payer: Self-pay | Admitting: Physician Assistant

## 2021-11-04 ENCOUNTER — Other Ambulatory Visit: Payer: Self-pay

## 2021-11-10 ENCOUNTER — Inpatient Hospital Stay: Payer: No Typology Code available for payment source | Attending: Internal Medicine

## 2021-11-10 ENCOUNTER — Inpatient Hospital Stay: Payer: No Typology Code available for payment source

## 2021-11-10 ENCOUNTER — Inpatient Hospital Stay (HOSPITAL_BASED_OUTPATIENT_CLINIC_OR_DEPARTMENT_OTHER): Payer: No Typology Code available for payment source | Admitting: Internal Medicine

## 2021-11-10 DIAGNOSIS — Z5111 Encounter for antineoplastic chemotherapy: Secondary | ICD-10-CM | POA: Diagnosis present

## 2021-11-10 DIAGNOSIS — C3401 Malignant neoplasm of right main bronchus: Secondary | ICD-10-CM | POA: Diagnosis not present

## 2021-11-10 DIAGNOSIS — Z86711 Personal history of pulmonary embolism: Secondary | ICD-10-CM | POA: Diagnosis not present

## 2021-11-10 DIAGNOSIS — C3491 Malignant neoplasm of unspecified part of right bronchus or lung: Secondary | ICD-10-CM | POA: Diagnosis not present

## 2021-11-10 DIAGNOSIS — K219 Gastro-esophageal reflux disease without esophagitis: Secondary | ICD-10-CM | POA: Insufficient documentation

## 2021-11-10 DIAGNOSIS — Z79899 Other long term (current) drug therapy: Secondary | ICD-10-CM | POA: Insufficient documentation

## 2021-11-10 DIAGNOSIS — Z7901 Long term (current) use of anticoagulants: Secondary | ICD-10-CM | POA: Diagnosis not present

## 2021-11-10 DIAGNOSIS — D3502 Benign neoplasm of left adrenal gland: Secondary | ICD-10-CM | POA: Diagnosis not present

## 2021-11-10 DIAGNOSIS — J432 Centrilobular emphysema: Secondary | ICD-10-CM | POA: Insufficient documentation

## 2021-11-10 DIAGNOSIS — N1330 Unspecified hydronephrosis: Secondary | ICD-10-CM | POA: Insufficient documentation

## 2021-11-10 DIAGNOSIS — J449 Chronic obstructive pulmonary disease, unspecified: Secondary | ICD-10-CM | POA: Diagnosis not present

## 2021-11-10 LAB — CBC WITH DIFFERENTIAL (CANCER CENTER ONLY)
Abs Immature Granulocytes: 0.02 10*3/uL (ref 0.00–0.07)
Basophils Absolute: 0.1 10*3/uL (ref 0.0–0.1)
Basophils Relative: 1 %
Eosinophils Absolute: 0.1 10*3/uL (ref 0.0–0.5)
Eosinophils Relative: 1 %
HCT: 41.6 % (ref 39.0–52.0)
Hemoglobin: 14.2 g/dL (ref 13.0–17.0)
Immature Granulocytes: 0 %
Lymphocytes Relative: 12 %
Lymphs Abs: 0.8 10*3/uL (ref 0.7–4.0)
MCH: 33.2 pg (ref 26.0–34.0)
MCHC: 34.1 g/dL (ref 30.0–36.0)
MCV: 97.2 fL (ref 80.0–100.0)
Monocytes Absolute: 0.7 10*3/uL (ref 0.1–1.0)
Monocytes Relative: 11 %
Neutro Abs: 4.8 10*3/uL (ref 1.7–7.7)
Neutrophils Relative %: 75 %
Platelet Count: 299 10*3/uL (ref 150–400)
RBC: 4.28 MIL/uL (ref 4.22–5.81)
RDW: 14.8 % (ref 11.5–15.5)
WBC Count: 6.4 10*3/uL (ref 4.0–10.5)
nRBC: 0 % (ref 0.0–0.2)

## 2021-11-10 LAB — CMP (CANCER CENTER ONLY)
ALT: 17 U/L (ref 0–44)
AST: 21 U/L (ref 15–41)
Albumin: 3.8 g/dL (ref 3.5–5.0)
Alkaline Phosphatase: 99 U/L (ref 38–126)
Anion gap: 6 (ref 5–15)
BUN: 15 mg/dL (ref 8–23)
CO2: 28 mmol/L (ref 22–32)
Calcium: 9.3 mg/dL (ref 8.9–10.3)
Chloride: 102 mmol/L (ref 98–111)
Creatinine: 0.98 mg/dL (ref 0.61–1.24)
GFR, Estimated: >60 mL/min (ref >60)
Glucose, Bld: 109 mg/dL — ABNORMAL HIGH (ref 70–99)
Potassium: 5.1 mmol/L (ref 3.5–5.1)
Sodium: 136 mmol/L (ref 135–145)
Total Bilirubin: 0.5 mg/dL (ref 0.3–1.2)
Total Protein: 7.6 g/dL (ref 6.5–8.1)

## 2021-11-10 MED ORDER — SODIUM CHLORIDE 0.9 % IV SOLN: Freq: Once | INTRAVENOUS | Status: AC

## 2021-11-10 MED ORDER — SODIUM CHLORIDE 0.9 % IV SOLN
500.0000 mg/m2 | Freq: Once | INTRAVENOUS | Status: AC
  Administered 2021-11-10: 1000 mg via INTRAVENOUS
  Filled 2021-11-10: qty 40

## 2021-11-10 MED ORDER — PROCHLORPERAZINE MALEATE 10 MG PO TABS
10.0000 mg | ORAL_TABLET | Freq: Once | ORAL | Status: AC
  Administered 2021-11-10: 10 mg via ORAL
  Filled 2021-11-10: qty 1

## 2021-11-10 NOTE — Patient Instructions (Signed)
[facility]  Discharge Instructions: Thank you for choosing [facility] to provide your oncology and hematology care.   If you have a lab appointment with the [facility], please go directly to the [facility] and check in at the registration area.   Wear comfortable clothing and clothing appropriate for easy access to any Portacath or PICC line.   We strive to give you quality time with your provider. You may need to reschedule your appointment if you arrive late (15 or more minutes).  Arriving late affects you and other patients whose appointments are after yours.  Also, if you miss three or more appointments without notifying the office, you may be dismissed from the clinic at the provider's discretion.      For prescription refill requests, have your pharmacy contact our office and allow 72 hours for refills to be completed.    Today you received the following chemotherapy and/or immunotherapy agents: Alimta      To help prevent nausea and vomiting after your treatment, we encourage you to take your nausea medication as directed.  BELOW ARE SYMPTOMS THAT SHOULD BE REPORTED IMMEDIATELY: *FEVER GREATER THAN 100.4 F (38 C) OR HIGHER *CHILLS OR SWEATING *NAUSEA AND VOMITING THAT IS NOT CONTROLLED WITH YOUR NAUSEA MEDICATION *UNUSUAL SHORTNESS OF BREATH *UNUSUAL BRUISING OR BLEEDING *URINARY PROBLEMS (pain or burning when urinating, or frequent urination) *BOWEL PROBLEMS (unusual diarrhea, constipation, pain near the anus) TENDERNESS IN MOUTH AND THROAT WITH OR WITHOUT PRESENCE OF ULCERS (sore throat, sores in mouth, or a toothache) UNUSUAL RASH, SWELLING OR PAIN  UNUSUAL VAGINAL DISCHARGE OR ITCHING   Items with * indicate a potential emergency and should be followed up as soon as possible or go to the Emergency Department if any problems should occur.  Please show the CHEMOTHERAPY ALERT CARD or IMMUNOTHERAPY ALERT CARD at check-in to the  Emergency Department and triage nurse.  Should you have questions after your visit or need to cancel or reschedule your appointment, please contact [facility]  Dept: [phone]  and follow the prompts.  Office hours are 8:00 a.m. to 4:30 p.m. Monday - Friday. Please note that voicemails left after 4:00 p.m. may not be returned until the following business day.  We are closed weekends and major holidays. You have access to a nurse at all times for urgent questions. Please call the main number to the clinic Dept: [phone] and follow the prompts.   For any non-urgent questions, you may also contact your provider using MyChart. We now offer e-Visits for anyone 68 and older to request care online for non-urgent symptoms. For details visit [email or web address].   Also download the MyChart app! Go to the app store, search "MyChart", open the app, select [facility], and log in with your MyChart username and password.  Masks are optional in the cancer centers. If you would like for your care team to wear a mask while they are taking care of you, please let them know. You may have one support person who is at least 66 years old accompany you for your appointments.

## 2021-11-10 NOTE — Addendum Note (Signed)
Addended by: Ardeen Garland on: 11/10/2021 10:56 AM   Modules accepted: Orders

## 2021-11-10 NOTE — Progress Notes (Signed)
[facility] [phone]   [phone]  OFFICE PROGRESS NOTE  Philip Pepper, MD [address] [address] [address]  DIAGNOSIS:  1) recurrent non-small cell lung cancer initially diagnosed as stage IIIc (T4, N3, M0) non-small cell lung cancer, adenocarcinoma presented with large right hilar mass in addition to bilateral hilar and mediastinal lymphadenopathy diagnosed in December 2020.  The patient has evidence for disease recurrence in October 2022. 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  Started on Xarelto on March 17, 2020 and currently 20 mg p.o. daily.   PRIOR THERAPY:  1) Weekly concurrent chemoradiation with carboplatin for an AUC of 2 and paclitaxel 45 mg/m2. First dose starting 01/30/2019.   Status post 6 cycles. 2) Consolidation immunotherapy with Imfinzi 1500 mg IV every 4 weeks. First dose expected on 04/19/2019.  Status post 13 cycles. 3) disease recurrence in October 2022.   CURRENT THERAPY: Systemic chemotherapy with carboplatin for AUC of 5, Alimta 500 Mg/M2 and Keytruda 200 Mg IV every 3 weeks.  First dose January 01, 2021.  Status post 15 cycles.  He had hypersensitivity reaction to carboplatin and this was discontinued after cycle #2.  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  INTERVAL HISTORY: Philip [name] 66 y.o. male returns to the clinic today for follow-up visit accompanied by his wife.  The patient is feeling fine today with no concerning complaints.  He denied having any chest pain, shortness of breath, cough or hemoptysis.  He has no nausea, vomiting, diarrhea or constipation.  He has no significant weight loss or night sweats.  He had a ureteral stent and he occasionally feels some back pain.  He also has mild swelling and wearing stockings for  the swelling.  He is here today for evaluation before starting cycle #16.  MEDICAL HISTORY: Past Medical History:  Diagnosis Date   COPD (chronic obstructive pulmonary disease) (HCC)    GERD (gastroesophageal reflux disease)    Headache    migraines   Hyperlipidemia    lung ca dx'd 12/2018   Pneumonia    Pulmonary embolus (HCC)    Tobacco abuse     ALLERGIES:  is allergic to carboplatin, klonopin [clonazepam], and norco [hydrocodone-acetaminophen].  MEDICATIONS:  Current Outpatient Medications  Medication Sig Dispense Refill   acetaminophen (TYLENOL) 500 MG tablet Take 500-1,000 mg by mouth every 6 (six) hours as needed (pain.).     albuterol (VENTOLIN HFA) 108 (90 Base) MCG/ACT inhaler Inhale 2 puffs into the lungs every 4 (four) hours as needed for wheezing or shortness of breath. 18 g 6   dextromethorphan-guaiFENesin (MUCINEX DM) 30-600 MG 12hr tablet Take 1 tablet by mouth 2 (two) times daily as needed for cough. (Patient not taking: Reported on 10/23/2021) 40 tablet 0   famotidine (PEPCID) 20 MG tablet TAKE 1 TABLET BY MOUTH TWICE A DAY 180 tablet 1   fexofenadine (ALLEGRA) 180 MG tablet Take 180 mg by mouth in the morning.     folic acid (FOLVITE) 1 MG tablet TAKE 1 TABLET BY MOUTH EVERY DAY (Patient taking differently: Take 1 mg by mouth every evening.) 90 tablet 1   HYDROcodone-acetaminophen (NORCO) 5-325 MG tablet Take 1 tablet by mouth every 6 (six) hours as needed for moderate pain. (Patient not taking: Reported on 10/23/2021) 5 tablet 0  Multiple Vitamins-Minerals (MULTI ADULT GUMMIES) CHEW Chew 1 tablet by mouth in the morning and at bedtime.     naphazoline-pheniramine (VISINE) 0.025-0.3 % ophthalmic solution Place 1 drop into both eyes in the morning.     prochlorperazine (COMPAZINE) 10 MG tablet Take 1 tablet (10 mg total) by mouth every 6 (six) hours as needed for nausea or vomiting. 30 tablet 0   rivaroxaban (XARELTO) 20 MG TABS tablet Take 1 tablet (20 mg total) by  mouth daily with supper. 30 tablet 11   Tiotropium Bromide-Olodaterol (STIOLTO RESPIMAT) 2.5-2.5 MCG/ACT AERS Inhale 2 puffs into the lungs daily. 1 each 11   No current facility-administered medications for this visit.    SURGICAL HISTORY:  Past Surgical History:  Procedure Laterality Date   COLONOSCOPY     CYSTOSCOPY W/ URETERAL STENT PLACEMENT Right 10/24/2021   Procedure: CYSTOSCOPY WITH RETROGRADE PYELOGRAM/URETERAL STENT PLACEMENT;  Surgeon: Janith Lima, MD;  Location: [facility];  Service: Urology;  Laterality: Right;   HAND SURGERY     VIDEO BRONCHOSCOPY WITH ENDOBRONCHIAL ULTRASOUND N/A 12/22/2018   Procedure: VIDEO BRONCHOSCOPY WITH ENDOBRONCHIAL ULTRASOUND;  Surgeon: Garner Nash, DO;  Location: [facility];  Service: Thoracic;  Laterality: N/A;   VIDEO BRONCHOSCOPY WITH ENDOBRONCHIAL ULTRASOUND N/A 01/09/2019   Procedure: VIDEO BRONCHOSCOPY WITH ENDOBRONCHIAL ULTRASOUND WITH FLUORO;  Surgeon: Garner Nash, DO;  Location: [facility];  Service: Thoracic;  Laterality: N/A;   VIDEO BRONCHOSCOPY WITH RADIAL ENDOBRONCHIAL ULTRASOUND N/A 12/22/2018   Procedure: RADIAL ENDOBRONCHIAL ULTRASOUND;  Surgeon: Garner Nash, DO;  Location: [facility];  Service: Thoracic;  Laterality: N/A;    REVIEW OF SYSTEMS:  A comprehensive review of systems was negative except for: Constitutional: positive for fatigue Musculoskeletal: positive for back pain   PHYSICAL EXAMINATION: General appearance: alert, cooperative, fatigued, and no distress Head: Normocephalic, without obvious abnormality, atraumatic Neck: no adenopathy, no JVD, supple, symmetrical, trachea midline, and thyroid not enlarged, symmetric, no tenderness/mass/nodules Lymph nodes: Cervical, supraclavicular, and axillary nodes normal. Resp: clear to auscultation bilaterally Back: symmetric, no curvature. ROM normal. No CVA tenderness. Cardio: regular rate and rhythm, S1, S2 normal, no murmur, click, rub or gallop GI: soft, non-tender; bowel  sounds normal; no masses,  no organomegaly Extremities: extremities normal, atraumatic, no cyanosis or edema  ECOG PERFORMANCE STATUS: 1 - Symptomatic but completely ambulatory  Blood pressure 112/73, pulse 85, temperature 97.9 F (36.6 C), temperature source Oral, resp. rate 14, weight 189 lb 3.2 oz (85.8 kg), SpO2 90 %.  LABORATORY DATA: Lab Results  Component Value Date   WBC 6.4 11/10/2021   HGB 14.2 11/10/2021   HCT 41.6 11/10/2021   MCV 97.2 11/10/2021   PLT 299 11/10/2021      Chemistry      Component Value Date/Time   NA 138 10/23/2021 2250   K 4.3 10/23/2021 2250   CL 105 10/23/2021 2250   CO2 23 10/23/2021 2250   BUN 17 10/23/2021 2250   CREATININE 1.02 10/23/2021 2250   CREATININE 1.03 10/20/2021 1007      Component Value Date/Time   CALCIUM 9.1 10/23/2021 2250   ALKPHOS 97 10/23/2021 2250   AST 35 10/23/2021 2250   AST 24 10/20/2021 1007   ALT 24 10/23/2021 2250   ALT 19 10/20/2021 1007   BILITOT 0.6 10/23/2021 2250   BILITOT 0.4 10/20/2021 1007       RADIOGRAPHIC STUDIES: DG C-Arm 1-60 Min-No Report  Result Date: 10/24/2021 Fluoroscopy was utilized by the requesting physician.  No radiographic  interpretation.   CT Angio Chest Pulmonary Embolism (PE) W or WO Contrast  Result Date: 10/24/2021 CLINICAL DATA:  Chest pain and shortness of breath, history of lung carcinoma EXAM: CT ANGIOGRAPHY CHEST WITH CONTRAST TECHNIQUE: Multidetector CT imaging of the chest was performed using the standard protocol during bolus administration of intravenous contrast. Multiplanar CT image reconstructions and MIPs were obtained to evaluate the vascular anatomy. RADIATION DOSE REDUCTION: This exam was performed according to the departmental dose-optimization program which includes automated exposure control, adjustment of the mA and/or kV according to patient size and/or use of iterative reconstruction technique. CONTRAST:  15mL OMNIPAQUE IOHEXOL 350 MG/ML SOLN COMPARISON:   10/17/2021 FINDINGS: Cardiovascular: Thoracic aorta and its branches show no aneurysmal dilatation or dissection. No cardiac enlargement is seen. Pulmonary artery shows a normal branching pattern. No filling defect to suggest pulmonary embolism is noted. Mediastinum/Nodes: Thoracic inlet is within normal limits. No hilar or mediastinal adenopathy is noted. The esophagus is within normal limits. Lungs/Pleura: Emphysematous changes are noted particularly in the upper lobes. Areas of architectural distortion are again noted in the medial aspect of the right lower lobe as well as in the right upper lobe assistant with the given clinical history and related to underlying post treatment changes. Similar findings in the left lung are noted as well. No new focal parenchymal density is seen. No sizable effusion is noted. Upper Abdomen: Visualized upper abdomen shows a stable left adrenal mass measuring up to 3.0 cm. This is stable and again consistent with adenoma. No other focal abnormality in the upper abdomen is seen. Musculoskeletal: No chest wall abnormality. No acute or significant osseous findings. Review of the MIP images confirms the above findings. IMPRESSION: No evidence of pulmonary embolism. Stable left adrenal lesion dating back to 2020. No increased metabolic activity was noted on prior PET-CT. This is consistent with adenoma. No further follow-up is recommended. Areas of architectural distortion and consolidation with bronchiectasis consistent with post treatment change and stable from the prior exam. No acute abnormality noted. Electronically Signed   By: Inez Catalina M.D.   On: 10/24/2021 00:05   DG Chest Port 1 View  Result Date: 10/23/2021 CLINICAL DATA:  Mid chest pain, shortness of breath, lung cancer EXAM: PORTABLE CHEST 1 VIEW COMPARISON:  10/14/2021, 10/17/2021 FINDINGS: Single frontal view of the chest demonstrates a stable cardiac silhouette. Bilateral hilar scarring unchanged. Chronic  elevation of the right hemidiaphragm. Low lung volumes. No acute airspace disease, effusion, or pneumothorax. Chronic bronchiectasis. No acute bony abnormalities. IMPRESSION: 1. Post radiation scarring in the hilar regions, stable. 2. Low lung volumes, with chronic elevation the right hemidiaphragm. 3. No acute airspace disease. Electronically Signed   By: Randa Ngo M.D.   On: 10/23/2021 23:17   CT CHEST ABDOMEN PELVIS WO CONTRAST  Result Date: 10/19/2021 CLINICAL DATA:  Non-small-cell lung cancer. Restaging. * Tracking Code: BO * EXAM: CT CHEST, ABDOMEN AND PELVIS WITHOUT CONTRAST TECHNIQUE: Multidetector CT imaging of the chest, abdomen and pelvis was performed following the standard protocol without IV contrast. RADIATION DOSE REDUCTION: This exam was performed according to the departmental dose-optimization program which includes automated exposure control, adjustment of the mA and/or kV according to patient size and/or use of iterative reconstruction technique. COMPARISON:  08/15/2021 FINDINGS: CT CHEST FINDINGS Cardiovascular: The heart size is normal. No substantial pericardial effusion. Coronary artery calcification is evident. Mild atherosclerotic calcification is noted in the wall of the thoracic aorta. Mediastinum/Nodes: Stable upper normal to borderline enlarged mediastinal lymph nodes. Post  treatment scarring again noted both hilar regions. The esophagus has normal imaging features. There is no axillary lymphadenopathy. Lungs/Pleura: Centrilobular and paraseptal emphysema evident. Volume loss with fibrosis in the parahilar lungs bilaterally is similar to prior consistent with post treatment scarring no new suspicious pulmonary nodule or mass trace posterior right pleural effusion is stable. Musculoskeletal: The lungs are clear without focal pneumonia, edema, pneumothorax or pleural effusion. CT ABDOMEN PELVIS FINDINGS Hepatobiliary: No suspicious focal abnormality in the liver on this study  without intravenous contrast. There is no evidence for gallstones, gallbladder wall thickening, or pericholecystic fluid. No intrahepatic or extrahepatic biliary dilation. Pancreas: No focal mass lesion. No dilatation of the main duct. No intraparenchymal cyst. No peripancreatic edema. Spleen: No splenomegaly. No focal mass lesion. Adrenals/Urinary Tract: Right adrenal gland unremarkable. Stable small left adrenal adenoma. Right-sided hydronephrosis evident without hydroureter suggesting UPJ obstruction. Left kidney and ureter unremarkable. The urinary bladder appears normal for the degree of distention. Stomach/Bowel: Stomach is decompressed. Duodenum is normally positioned as is the ligament of Treitz. No small bowel wall thickening. No small bowel dilatation. The terminal ileum is normal. The appendix is normal. No gross colonic mass. No colonic wall thickening. Vascular/Lymphatic: There is mild atherosclerotic calcification of the abdominal aorta without aneurysm. There is no gastrohepatic or hepatoduodenal ligament lymphadenopathy. No retroperitoneal or mesenteric lymphadenopathy. No pelvic sidewall lymphadenopathy. Reproductive: The prostate gland and seminal vesicles are unremarkable. Other: No intraperitoneal free fluid. Musculoskeletal: No worrisome lytic or sclerotic osseous abnormality. IMPRESSION: 1. Stable exam. Scattered borderline enlarged mediastinal lymph nodes are unchanged. No new findings to raise concern for recurrent or metastatic disease in the chest, abdomen, or pelvis. 2. Stable small left adrenal adenoma. 3. Stable right-sided hydronephrosis without hydroureter suggesting UPJ obstruction. 4. Aortic Atherosclerosis (ICD10-I70.0) and Emphysema (ICD10-J43.9). Electronically Signed   By: Misty Stanley M.D.   On: 10/19/2021 07:20   DG Chest 2 View  Result Date: 10/15/2021 CLINICAL DATA:  66 year old male with preoperative chest x-ray EXAM: CHEST - 2 VIEW COMPARISON:  01/15/2021, chest CT  08/15/2021 FINDINGS: Cardiomediastinal silhouette unchanged in size and contour. Similar asymmetric elevation of the right hemidiaphragm. Mixed architectural distortion and consolidation at the medial right lung base as well as in the left hilar region, present on the comparison CT scan. No pneumothorax. No pleural effusion. No confluent airspace disease. Coarsened interstitial markings present bilaterally. IMPRESSION: Chronic/post treatment effects of the chest, with no definite acute cardiopulmonary disease Electronically Signed   By: Corrie Mckusick D.O.   On: 10/15/2021 12:33     ASSESSMENT AND PLAN: This is a very pleasant 66 years old white male with metastatic non-small cell lung cancer that was initially diagnosed as stage IIIc non-small cell lung cancer, adenocarcinoma diagnosed in December 2020 The patient completed a course of concurrent chemoradiation with weekly carboplatin and paclitaxel status post 6 cycles and he tolerated his treatment well and has partial response. He underwent consolidation treatment with immunotherapy with Imfinzi 1500 mg IV every 4 weeks status post 13 cycles. He was on observation and feeling fine with no concerning complaints except for mild cough as well as the left shoulder pain. His scan showed increase in the size of the soft tissue nodule in the right middle lobe adjacent to the postradiation changes highly suspicious for recurrent disease.  There was also soft tissue of the right hilum/as ago esophageal recess unchanged at but increased compared to remote prior studies. The patient had a PET scan that showed hypermetabolic nodule in the right  lower lobe adjacent to the post radiation treatment site increased in size and consistent with lung cancer recurrence.  There was second hypermetabolic nodule in the right lower lobe above the diaphragm concerning for recurrence and hypermetabolic contralateral lymph node in the high left paratracheal mediastinum also  concerning for metastatic adenopathy recurrence. He started systemic chemotherapy with carboplatin for AUC of 5, Alimta 500 Mg/M2 and Keytruda 200 Mg IV every 3 weeks on January 01, 2021.   Carboplatin was discontinued with cycle #2 secondary to hypersensitivity reaction.  The patient is status post 15 cycles.  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  The patient is currently on single agent treatment with Alimta. The patient has been tolerating this treatment well with no concerning adverse side effects. I recommended for him to proceed with cycle #16 today as planned. For the hydronephrosis, he is followed by urology and had a ureteral stent placed. The patient was advised to call immediately if he has any other concerning symptoms in the interval.  The patient voices understanding of current disease status and treatment options and is in agreement with the current care plan. All questions were answered. The patient knows to call the clinic with any problems, questions or concerns. We can certainly see the patient much sooner if necessary.  Disclaimer: This note was dictated with voice recognition software. Similar sounding words can inadvertently be transcribed and may not be corrected upon review.

## 2021-12-01 ENCOUNTER — Encounter: Payer: Self-pay | Admitting: Internal Medicine

## 2021-12-01 ENCOUNTER — Inpatient Hospital Stay (HOSPITAL_BASED_OUTPATIENT_CLINIC_OR_DEPARTMENT_OTHER): Payer: No Typology Code available for payment source | Admitting: Internal Medicine

## 2021-12-01 ENCOUNTER — Inpatient Hospital Stay: Payer: No Typology Code available for payment source

## 2021-12-01 VITALS — BP 113/87 | HR 86 | Temp 98.3°F | Resp 16

## 2021-12-01 DIAGNOSIS — Z5111 Encounter for antineoplastic chemotherapy: Secondary | ICD-10-CM | POA: Diagnosis not present

## 2021-12-01 DIAGNOSIS — C3491 Malignant neoplasm of unspecified part of right bronchus or lung: Secondary | ICD-10-CM

## 2021-12-01 LAB — CBC WITH DIFFERENTIAL (CANCER CENTER ONLY)
Abs Immature Granulocytes: 0.02 10*3/uL (ref 0.00–0.07)
Basophils Absolute: 0 10*3/uL (ref 0.0–0.1)
Basophils Relative: 1 %
Eosinophils Absolute: 0 10*3/uL (ref 0.0–0.5)
Eosinophils Relative: 1 %
HCT: 41.5 % (ref 39.0–52.0)
Hemoglobin: 14 g/dL (ref 13.0–17.0)
Immature Granulocytes: 0 %
Lymphocytes Relative: 18 %
Lymphs Abs: 0.9 10*3/uL (ref 0.7–4.0)
MCH: 32.8 pg (ref 26.0–34.0)
MCHC: 33.7 g/dL (ref 30.0–36.0)
MCV: 97.2 fL (ref 80.0–100.0)
Monocytes Absolute: 0.7 10*3/uL (ref 0.1–1.0)
Monocytes Relative: 15 %
Neutro Abs: 3.2 10*3/uL (ref 1.7–7.7)
Neutrophils Relative %: 65 %
Platelet Count: 283 10*3/uL (ref 150–400)
RBC: 4.27 MIL/uL (ref 4.22–5.81)
RDW: 14.3 % (ref 11.5–15.5)
WBC Count: 4.9 10*3/uL (ref 4.0–10.5)
nRBC: 0 % (ref 0.0–0.2)

## 2021-12-01 LAB — CMP (CANCER CENTER ONLY)
ALT: 18 U/L (ref 0–44)
AST: 22 U/L (ref 15–41)
Albumin: 3.6 g/dL (ref 3.5–5.0)
Alkaline Phosphatase: 95 U/L (ref 38–126)
Anion gap: 6 (ref 5–15)
BUN: 12 mg/dL (ref 8–23)
CO2: 30 mmol/L (ref 22–32)
Calcium: 8.8 mg/dL — ABNORMAL LOW (ref 8.9–10.3)
Chloride: 101 mmol/L (ref 98–111)
Creatinine: 1.04 mg/dL (ref 0.61–1.24)
GFR, Estimated: >60 mL/min (ref >60)
Glucose, Bld: 101 mg/dL — ABNORMAL HIGH (ref 70–99)
Potassium: 4 mmol/L (ref 3.5–5.1)
Sodium: 137 mmol/L (ref 135–145)
Total Bilirubin: 0.5 mg/dL (ref 0.3–1.2)
Total Protein: 7.2 g/dL (ref 6.5–8.1)

## 2021-12-01 MED ORDER — SODIUM CHLORIDE 0.9 % IV SOLN: Freq: Once | INTRAVENOUS | Status: AC

## 2021-12-01 MED ORDER — SODIUM CHLORIDE 0.9 % IV SOLN
500.0000 mg/m2 | Freq: Once | INTRAVENOUS | Status: AC
  Administered 2021-12-01: 1000 mg via INTRAVENOUS
  Filled 2021-12-01: qty 40

## 2021-12-01 MED ORDER — PROCHLORPERAZINE MALEATE 10 MG PO TABS
10.0000 mg | ORAL_TABLET | Freq: Once | ORAL | Status: AC
  Administered 2021-12-01: 10 mg via ORAL
  Filled 2021-12-01: qty 1

## 2021-12-01 NOTE — Progress Notes (Signed)
[facility] [phone]   [phone]  OFFICE PROGRESS NOTE  [name], MD [address] [address]  DIAGNOSIS:  1) recurrent non-small cell lung cancer initially diagnosed as stage IIIc (T4, N3, M0) non-small cell lung cancer, adenocarcinoma presented with large right hilar mass in addition to bilateral hilar and mediastinal lymphadenopathy diagnosed in December 2020.  The patient has evidence for disease recurrence in October 2022. 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  Started on Xarelto on March 17, 2020 and currently 20 mg p.o. daily.   PRIOR THERAPY:  1) Weekly concurrent chemoradiation with carboplatin for an AUC of 2 and paclitaxel 45 mg/m2. First dose starting 01/30/2019.   Status post 6 cycles. 2) Consolidation immunotherapy with Imfinzi 1500 mg IV every 4 weeks. First dose expected on 04/19/2019.  Status post 13 cycles. 3) disease recurrence in October 2022.   CURRENT THERAPY: Systemic chemotherapy with carboplatin for AUC of 5, Alimta 500 Mg/M2 and Keytruda 200 Mg IV every 3 weeks.  First dose January 01, 2021.  Status post 16 cycles.  He had hypersensitivity reaction to carboplatin and this was discontinued after cycle #2.  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  INTERVAL HISTORY: Philip [name] 66 y.o. male returns to the clinic today for follow-up visit.  He is feeling fine with no concerning complaints except for lack of taste.  He denied having any chest pain, shortness of breath, cough or hemoptysis.  He continues to have some hypersensitivity in the right nipple.  He denied having any nausea, vomiting, diarrhea or constipation.  He has no headache or visual changes.  He is here today for evaluation before starting cycle  #17.   MEDICAL HISTORY: Past Medical History:  Diagnosis Date   COPD (chronic obstructive pulmonary disease) (HCC)    GERD (gastroesophageal reflux disease)    Headache    migraines   Hyperlipidemia    lung ca dx'd 12/2018   Pneumonia    Pulmonary embolus (HCC)    Tobacco abuse     ALLERGIES:  is allergic to carboplatin, klonopin [clonazepam], and norco [hydrocodone-acetaminophen].  MEDICATIONS:  Current Outpatient Medications  Medication Sig Dispense Refill   acetaminophen (TYLENOL) 500 MG tablet Take 500-1,000 mg by mouth every 6 (six) hours as needed (pain.).     albuterol (VENTOLIN HFA) 108 (90 Base) MCG/ACT inhaler Inhale 2 puffs into the lungs every 4 (four) hours as needed for wheezing or shortness of breath. 18 g 6   dextromethorphan-guaiFENesin (MUCINEX DM) 30-600 MG 12hr tablet Take 1 tablet by mouth 2 (two) times daily as needed for cough. (Patient not taking: Reported on 10/23/2021) 40 tablet 0   famotidine (PEPCID) 20 MG tablet TAKE 1 TABLET BY MOUTH TWICE A DAY 180 tablet 1   fexofenadine (ALLEGRA) 180 MG tablet Take 180 mg by mouth in the morning.     folic acid (FOLVITE) 1 MG tablet TAKE 1 TABLET BY MOUTH EVERY DAY (Patient taking differently: Take 1 mg by mouth every evening.) 90 tablet 1   HYDROcodone-acetaminophen (NORCO) 5-325 MG tablet Take 1 tablet by mouth every 6 (six) hours as needed for moderate pain. (Patient not taking: Reported on 10/23/2021) 5 tablet 0   Multiple Vitamins-Minerals (MULTI ADULT GUMMIES) CHEW Chew 1 tablet by mouth in the morning and  at bedtime.     naphazoline-pheniramine (VISINE) 0.025-0.3 % ophthalmic solution Place 1 drop into both eyes in the morning.     rivaroxaban (XARELTO) 20 MG TABS tablet Take 1 tablet (20 mg total) by mouth daily with supper. 30 tablet 11   tamsulosin (FLOMAX) 0.4 MG CAPS capsule Take 0.4 mg by mouth at bedtime.     Tiotropium Bromide-Olodaterol (STIOLTO RESPIMAT) 2.5-2.5 MCG/ACT AERS Inhale 2 puffs into the  lungs daily. 1 each 11   No current facility-administered medications for this visit.    SURGICAL HISTORY:  Past Surgical History:  Procedure Laterality Date   COLONOSCOPY     CYSTOSCOPY W/ URETERAL STENT PLACEMENT Right 10/24/2021   Procedure: CYSTOSCOPY WITH RETROGRADE PYELOGRAM/URETERAL STENT PLACEMENT;  Surgeon: Janith Lima, MD;  Location: [facility];  Service: Urology;  Laterality: Right;   HAND SURGERY     VIDEO BRONCHOSCOPY WITH ENDOBRONCHIAL ULTRASOUND N/A 12/22/2018   Procedure: VIDEO BRONCHOSCOPY WITH ENDOBRONCHIAL ULTRASOUND;  Surgeon: Garner Nash, DO;  Location: [facility];  Service: Thoracic;  Laterality: N/A;   VIDEO BRONCHOSCOPY WITH ENDOBRONCHIAL ULTRASOUND N/A 01/09/2019   Procedure: VIDEO BRONCHOSCOPY WITH ENDOBRONCHIAL ULTRASOUND WITH FLUORO;  Surgeon: Garner Nash, DO;  Location: [facility];  Service: Thoracic;  Laterality: N/A;   VIDEO BRONCHOSCOPY WITH RADIAL ENDOBRONCHIAL ULTRASOUND N/A 12/22/2018   Procedure: RADIAL ENDOBRONCHIAL ULTRASOUND;  Surgeon: Garner Nash, DO;  Location: [facility];  Service: Thoracic;  Laterality: N/A;    REVIEW OF SYSTEMS:  A comprehensive review of systems was negative except for: Constitutional: positive for fatigue   PHYSICAL EXAMINATION: General appearance: alert, cooperative, fatigued, and no distress Head: Normocephalic, without obvious abnormality, atraumatic Neck: no adenopathy, no JVD, supple, symmetrical, trachea midline, and thyroid not enlarged, symmetric, no tenderness/mass/nodules Lymph nodes: Cervical, supraclavicular, and axillary nodes normal. Resp: clear to auscultation bilaterally Back: symmetric, no curvature. ROM normal. No CVA tenderness. Cardio: regular rate and rhythm, S1, S2 normal, no murmur, click, rub or gallop GI: soft, non-tender; bowel sounds normal; no masses,  no organomegaly Extremities: extremities normal, atraumatic, no cyanosis or edema  ECOG PERFORMANCE STATUS: 1 - Symptomatic but completely  ambulatory  Blood pressure 93/68, pulse 93, temperature 97.7 F (36.5 C), temperature source Oral, resp. rate 17, weight 191 lb 1 oz (86.7 kg), SpO2 92 %.  LABORATORY DATA: Lab Results  Component Value Date   WBC 4.9 12/01/2021   HGB 14.0 12/01/2021   HCT 41.5 12/01/2021   MCV 97.2 12/01/2021   PLT 283 12/01/2021      Chemistry      Component Value Date/Time   NA 137 12/01/2021 1039   K 4.0 12/01/2021 1039   CL 101 12/01/2021 1039   CO2 30 12/01/2021 1039   BUN 12 12/01/2021 1039   CREATININE 1.04 12/01/2021 1039      Component Value Date/Time   CALCIUM 8.8 (L) 12/01/2021 1039   ALKPHOS 95 12/01/2021 1039   AST 22 12/01/2021 1039   ALT 18 12/01/2021 1039   BILITOT 0.5 12/01/2021 1039       RADIOGRAPHIC STUDIES: No results found.   ASSESSMENT AND PLAN: This is a very pleasant 66 years old white male with metastatic non-small cell lung cancer that was initially diagnosed as stage IIIc non-small cell lung cancer, adenocarcinoma diagnosed in December 2020 The patient completed a course of concurrent chemoradiation with weekly carboplatin and paclitaxel status post 6 cycles and he tolerated his treatment well and has partial response. He underwent consolidation treatment with immunotherapy  with Imfinzi 1500 mg IV every 4 weeks status post 13 cycles. He was on observation and feeling fine with no concerning complaints except for mild cough as well as the left shoulder pain. His scan showed increase in the size of the soft tissue nodule in the right middle lobe adjacent to the postradiation changes highly suspicious for recurrent disease.  There was also soft tissue of the right hilum/as ago esophageal recess unchanged at but increased compared to remote prior studies. The patient had a PET scan that showed hypermetabolic nodule in the right lower lobe adjacent to the post radiation treatment site increased in size and consistent with lung cancer recurrence.  There was second  hypermetabolic nodule in the right lower lobe above the diaphragm concerning for recurrence and hypermetabolic contralateral lymph node in the high left paratracheal mediastinum also concerning for metastatic adenopathy recurrence. He started systemic chemotherapy with carboplatin for AUC of 5, Alimta 500 Mg/M2 and Keytruda 200 Mg IV every 3 weeks on January 01, 2021.   Carboplatin was discontinued with cycle #2 secondary to hypersensitivity reaction.  The patient is status post 16 cycles.  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  The patient is currently on single agent treatment with Alimta. The patient has been tolerating this treatment well with no concerning adverse effects except for mild fatigue. I recommended for him to proceed with cycle #17 today as planned. I will see him back for follow-up visit in 3 weeks for evaluation with repeat blood work before the next cycle of his treatment. For the hydronephrosis, he is followed by urology and had a ureteral stent placed. He was advised to call immediately if he has any concerning symptoms in the interval.  The patient voices understanding of current disease status and treatment options and is in agreement with the current care plan. All questions were answered. The patient knows to call the clinic with any problems, questions or concerns. We can certainly see the patient much sooner if necessary.  Disclaimer: This note was dictated with voice recognition software. Similar sounding words can inadvertently be transcribed and may not be corrected upon review.

## 2021-12-01 NOTE — Patient Instructions (Signed)
[facility]   Discharge Instructions: Thank you for choosing [facility] to provide your oncology and hematology care.   If you have a lab appointment with the [facility], please go directly to the [facility] and check in at the registration area.   Wear comfortable clothing and clothing appropriate for easy access to any Portacath or PICC line.   We strive to give you quality time with your provider. You may need to reschedule your appointment if you arrive late (15 or more minutes).  Arriving late affects you and other patients whose appointments are after yours.  Also, if you miss three or more appointments without notifying the office, you may be dismissed from the clinic at the provider's discretion.      For prescription refill requests, have your pharmacy contact our office and allow 72 hours for refills to be completed.    Today you received the following chemotherapy and/or immunotherapy agents: Pemetrexed (Alimta)      To help prevent nausea and vomiting after your treatment, we encourage you to take your nausea medication as directed.  BELOW ARE SYMPTOMS THAT SHOULD BE REPORTED IMMEDIATELY: *FEVER GREATER THAN 100.4 F (38 C) OR HIGHER *CHILLS OR SWEATING *NAUSEA AND VOMITING THAT IS NOT CONTROLLED WITH YOUR NAUSEA MEDICATION *UNUSUAL SHORTNESS OF BREATH *UNUSUAL BRUISING OR BLEEDING *URINARY PROBLEMS (pain or burning when urinating, or frequent urination) *BOWEL PROBLEMS (unusual diarrhea, constipation, pain near the anus) TENDERNESS IN MOUTH AND THROAT WITH OR WITHOUT PRESENCE OF ULCERS (sore throat, sores in mouth, or a toothache) UNUSUAL RASH, SWELLING OR PAIN  UNUSUAL VAGINAL DISCHARGE OR ITCHING   Items with * indicate a potential emergency and should be followed up as soon as possible or go to the Emergency Department if any problems should occur.  Please show the CHEMOTHERAPY ALERT CARD or IMMUNOTHERAPY ALERT CARD at  check-in to the Emergency Department and triage nurse.  Should you have questions after your visit or need to cancel or reschedule your appointment, please contact [facility]  Dept: [phone]  and follow the prompts.  Office hours are 8:00 a.m. to 4:30 p.m. Monday - Friday. Please note that voicemails left after 4:00 p.m. may not be returned until the following business day.  We are closed weekends and major holidays. You have access to a nurse at all times for urgent questions. Please call the main number to the clinic Dept: [phone] and follow the prompts.   For any non-urgent questions, you may also contact your provider using MyChart. We now offer e-Visits for anyone 19 and older to request care online for non-urgent symptoms. For details visit [email or web address].   Also download the MyChart app! Go to the app store, search "MyChart", open the app, select [facility], and log in with your MyChart username and password.  Masks are optional in the cancer centers. If you would like for your care team to wear a mask while they are taking care of you, please let them know. You may have one support person who is at least 66 years old accompany you for your appointments.

## 2021-12-05 ENCOUNTER — Telehealth: Payer: Self-pay | Admitting: Physician Assistant

## 2021-12-05 NOTE — Telephone Encounter (Signed)
Called patient regarding upcoming November and December appointments, patient is notified.

## 2021-12-09 ENCOUNTER — Telehealth (HOSPITAL_COMMUNITY): Payer: Self-pay

## 2021-12-09 NOTE — Telephone Encounter (Signed)
No response from pt.  Closed referral  

## 2021-12-16 NOTE — Progress Notes (Signed)
[facility] OFFICE PROGRESS NOTE  [name], MD [address] [place] [address]  DIAGNOSIS: 1) recurrent non-small cell lung cancer initially diagnosed as stage IIIc (T4, N3, M0) non-small cell lung cancer, adenocarcinoma presented with large right hilar mass in addition to bilateral hilar and mediastinal lymphadenopathy diagnosed in December 2020.  The patient has evidence for disease recurrence in October 2022. 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  Started on Xarelto on March 17, 2020 and currently 20 mg p.o. daily.  PRIOR THERAPY: 1) Weekly concurrent chemoradiation with carboplatin for an AUC of 2 and paclitaxel 45 mg/m2. First dose starting 01/30/2019.   Status post 6 cycles. 2) Consolidation immunotherapy with Imfinzi 1500 mg IV every 4 weeks. First dose expected on 04/19/2019.  Status post 13 cycles. 3) disease recurrence in October 2022  CURRENT THERAPY: Systemic chemotherapy with carboplatin for AUC of 5, Alimta 500 Mg/M2 and Keytruda 200 Mg IV every 3 weeks.  First dose January 01, 2021.  Status post 17 cycles.  He had hypersensitivity reaction to carboplatin and this was discontinued after cycle #2.  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   INTERVAL HISTORY: Philip [name] 66 y.o. male returns to the clinic today for a follow-up visit. The patient is feeling fairly well today without any concerning complaints. The patient is currently on single agent chemotherapy with Alimta.  [name] was discontinued from the treatment plan due to possible pneumonitis. Today he denies any fever, chills, or recent night sweats. He did lose a few pounds. He reports decreased appetite secondary to taste alterations. He has some degree of oral thrush on exam.  He reports his similar baseline  dyspnea exertion.  Denies any hemoptysis or chest pain.  He denies any major cough. If he has a coughing spell though, it may cause nausea. Otherwise, denies frequent nausea. Denies any vomiting, diarrhea, or constipation, although he mentions he is somewhere in between both diarrhea and constipation. Denies any headache or visual changes. He follows by [facility] for hydronephrosis and he had a stent placed. He believes he is scheduled to see them next week. He reports he had been having dysuria and malodorous urine which he attributes to his flomax. He states he stopped taking flomax a few days ago and his dysuria improved. He also mentions he has been having hearing changes for about 1 year. He states his hearing is like he is under water. He is scheduled with a hearing specialist later this week. He is here today for evaluation and repeat blood work before starting cycle #18.     MEDICAL HISTORY: Past Medical History:  Diagnosis Date   COPD (chronic obstructive pulmonary disease) (HCC)    GERD (gastroesophageal reflux disease)    Headache    migraines   Hyperlipidemia    lung ca dx'd 12/2018   Pneumonia    Pulmonary embolus (HCC)    Tobacco abuse     ALLERGIES:  is allergic to carboplatin, klonopin [clonazepam], and norco [hydrocodone-acetaminophen].  MEDICATIONS:  Current Outpatient Medications  Medication Sig Dispense Refill   acetaminophen (TYLENOL) 500 MG tablet Take 500-1,000 mg by mouth every 6 (six) hours as needed (pain.).     albuterol (VENTOLIN HFA) 108 (90 Base) MCG/ACT inhaler Inhale 2 puffs into the lungs every 4 (four) hours as needed for wheezing  or shortness of breath. 18 g 6   dextromethorphan-guaiFENesin (MUCINEX DM) 30-600 MG 12hr tablet Take 1 tablet by mouth 2 (two) times daily as needed for cough. 40 tablet 0   famotidine (PEPCID) 20 MG tablet TAKE 1 TABLET BY MOUTH TWICE A DAY 180 tablet 1   fluconazole (DIFLUCAN) 100 MG tablet Take 1 tablet (100 mg total)  by mouth daily. 10 tablet 0   folic acid (FOLVITE) 1 MG tablet TAKE 1 TABLET BY MOUTH EVERY DAY (Patient taking differently: Take 1 mg by mouth every evening.) 90 tablet 1   HYDROcodone-acetaminophen (NORCO) 5-325 MG tablet Take 1 tablet by mouth every 6 (six) hours as needed for moderate pain. 5 tablet 0   Multiple Vitamins-Minerals (MULTI ADULT GUMMIES) CHEW Chew 1 tablet by mouth in the morning and at bedtime.     naphazoline-pheniramine (VISINE) 0.025-0.3 % ophthalmic solution Place 1 drop into both eyes in the morning.     rivaroxaban (XARELTO) 20 MG TABS tablet Take 1 tablet (20 mg total) by mouth daily with supper. 30 tablet 11   tamsulosin (FLOMAX) 0.4 MG CAPS capsule Take 0.4 mg by mouth at bedtime.     Tiotropium Bromide-Olodaterol (STIOLTO RESPIMAT) 2.5-2.5 MCG/ACT AERS Inhale 2 puffs into the lungs daily. 1 each 11   No current facility-administered medications for this visit.    SURGICAL HISTORY:  Past Surgical History:  Procedure Laterality Date   COLONOSCOPY     CYSTOSCOPY W/ URETERAL STENT PLACEMENT Right 10/24/2021   Procedure: CYSTOSCOPY WITH RETROGRADE PYELOGRAM/URETERAL STENT PLACEMENT;  Surgeon: Janith Lima, MD;  Location: [facility];  Service: Urology;  Laterality: Right;   HAND SURGERY     VIDEO BRONCHOSCOPY WITH ENDOBRONCHIAL ULTRASOUND N/A 12/22/2018   Procedure: VIDEO BRONCHOSCOPY WITH ENDOBRONCHIAL ULTRASOUND;  Surgeon: [facility] Nash, DO;  Location: [facility];  Service: Thoracic;  Laterality: N/A;   VIDEO BRONCHOSCOPY WITH ENDOBRONCHIAL ULTRASOUND N/A 01/09/2019   Procedure: VIDEO BRONCHOSCOPY WITH ENDOBRONCHIAL ULTRASOUND WITH FLUORO;  Surgeon: [facility] Nash, DO;  Location: [facility];  Service: Thoracic;  Laterality: N/A;   VIDEO BRONCHOSCOPY WITH RADIAL ENDOBRONCHIAL ULTRASOUND N/A 12/22/2018   Procedure: RADIAL ENDOBRONCHIAL ULTRASOUND;  Surgeon: [facility], DO;  Location: [facility];  Service: Thoracic;  Laterality: N/A;    REVIEW OF SYSTEMS:   Constitutional:  Positive for stable fatigue and decreased appetite and weight loss. Negative for chills and fever. HENT:  Positive for taste alteration. Negative for mouth sores, nosebleeds, sore throat and trouble swallowing.   Eyes: Negative for eye problems and icterus.  Respiratory: Positive for baseline shortness of breath with exertion.Negative for hemoptysis and wheezing.   Cardiovascular: Negative for chest pain and leg swelling.  Gastrointestinal: Negative for abdominal pain, constipation, diarrhea, nausea and vomiting.  Genitourinary: Positive for frequent urination. Positive for dysuria a few days ago. Negative for bladder incontinence, difficulty urinating, frequency and hematuria.   Musculoskeletal: Negative for back pain, gait problem, neck pain and neck stiffness.  Skin: Negative for itching and rash.  Neurological: Negative for dizziness, extremity weakness, gait problem, headaches, light-headedness and seizures.  Hematological: Negative for adenopathy. Does not bruise/bleed easily.  Psychiatric/Behavioral: Negative for confusion, depression and sleep disturbance. The patient is not nervous/anxious.     PHYSICAL EXAMINATION:  Blood pressure 112/80, pulse (!) 108, temperature 98 F (36.7 C), temperature source Oral, resp. rate 18, weight 189 lb 8 oz (86 kg), SpO2 95 %.  ECOG PERFORMANCE STATUS: 1  Physical Exam  Constitutional: Oriented to person, place, and  time and well-developed, well-nourished, and in no distress.  HENT:  Head: Normocephalic and atraumatic.  Mouth/Throat: Oropharynx is clear and moist.Positive for oral thrush.  Eyes: Conjunctivae are normal. Right eye exhibits no discharge. Left eye exhibits no discharge. No scleral icterus.  Neck: Normal range of motion. Neck supple.  Cardiovascular: Normal rate, regular rhythm, normal heart sounds and intact distal pulses.   Pulmonary/Chest: Effort normal and breath sounds normal. No respiratory distress. No wheezes. No rales.   Abdominal: Soft. Bowel sounds are normal. Exhibits no distension and no mass. There is no tenderness.  Musculoskeletal: Normal range of motion. Bilateral mild pitting edema in ankles. No calf pain or erythema.  Lymphadenopathy:    No cervical adenopathy.  Neurological: Alert and oriented to person, place, and time. Exhibits normal muscle tone. Gait normal. Coordination normal.  Skin: Skin is warm and dry. No rash noted. Not diaphoretic. No erythema. No pallor.  Psychiatric: Mood, memory and judgment normal.  Vitals reviewed.  LABORATORY DATA: Lab Results  Component Value Date   WBC 6.5 12/22/2021   HGB 14.7 12/22/2021   HCT 43.4 12/22/2021   MCV 96.0 12/22/2021   PLT 308 12/22/2021      Chemistry      Component Value Date/Time   NA 137 12/22/2021 1104   K 4.3 12/22/2021 1104   CL 103 12/22/2021 1104   CO2 26 12/22/2021 1104   BUN 21 12/22/2021 1104   CREATININE 0.94 12/22/2021 1104      Component Value Date/Time   CALCIUM 9.7 12/22/2021 1104   ALKPHOS 114 12/22/2021 1104   AST 22 12/22/2021 1104   ALT 16 12/22/2021 1104   BILITOT 0.5 12/22/2021 1104       RADIOGRAPHIC STUDIES:  No results found.   ASSESSMENT/PLAN:  This is a very pleasant 66 year old Caucasian male initially diagnosed with stage IIIc non-small cell lung cancer, adenocarcinoma.  He was diagnosed in 2020. Molecular studies negative for any actionable mutations.  He was found to have disease recurrence in October 2022. No actionable mutations by guardant 360.    The patient initially completed concurrent chemoradiation with weekly carboplatin for an AUC of 2 and paclitaxel 45 mg per metered squared.  He status post 6 cycles.  He tolerated this well with a partial response.   He then underwent consolidation immunotherapy with Imfinzi 1500 mg IV every 4 weeks.  He status post 13 cycles.   In September 2022, the patient scan showed an increase in the size of the soft tissue nodule in the right middle  lobe adjacent to the post radiation changes which is highly suspicious for disease recurrence.  He also has a soft tissue of the right hilum/azygoesophageal recess increased compared to remote prior studies.    The patient a PET scan that showed hypermetabolic nodule in the right lower lobe adjacent to the post radiation treatment site is increased consistent with lung cancer recurrence.  There is also a second hypermetabolic nodule in the right lower lobe above the diaphragm concerning for recurrence and hypermetabolic contralateral lymph node in the high left paratracheal mediastinum also concerning for metastatic adenopathy recurrence.   Dr. Julien Nordmann arranged for the patient to have guardant 360 molecular testing performed which showed no actionable mutations.   Therefore, Dr. Julien Nordmann recommend the patient start palliative systemic chemotherapy with carboplatin for an AUC of 5, Alimta 500 mg per metered square, Keytruda 200 mg IV every 3 weeks. He is status post 17 cycles of treatment. Starting from cycle #5,  the patient started maintenance alimta 500 mg/m2 and Keytruda 200 mg IV every 3 weeks.  [name] was discontinued starting from cycle #9 due to possible pneumonitis.   Labs reviewed.  Recommend he proceed with cycle #18 today as scheduled.  I will arrange for restaging CT scan of the chest, abdomen, pelvis prior to starting his next cycle of treatment.  We will see him back for follow-up visit in 3 weeks for evaluation and repeat blood work and to review his scan results before starting cycle #19.  He will continue on Xarelto for his history of pulmonary embolism.   Patient has some evidence of thrush on exam.  I sent a prescription of Diflucan to his pharmacy.  He was also encouraged to salt water rinses and Biotene.  He was also encouraged to brush his tongue.   The patient reports some ongoing symptoms of malodorous urine and some dysuria although his symptoms have improved in the last  couple days since stopping Flomax, interestingly.  We will arrange for UA and culture today to rule out infection.  The patient reports that he has felt like his hearing is like he is "underwater" for about a year.  He is expected to see a hearing specialist later this week.  The patient is requesting a refill of albuterol.  I recommended he reach out to pulmonary medicine.  He is also scheduled to see his pulmonologist on 12/4.     The patient was advised to call immediately if he has any concerning symptoms in the interval. The patient voices understanding of current disease status and treatment options and is in agreement with the current care plan. All questions were answered. The patient knows to call the clinic with any problems, questions or concerns. We can certainly see the patient much sooner if necessary       Orders Placed This Encounter  Procedures   Urine Culture    Standing Status:   Future    Standing Expiration Date:   12/22/2022   CT Chest W Contrast    Standing Status:   Future    Standing Expiration Date:   12/22/2022    Order Specific Question:   If indicated for the ordered procedure, I authorize the administration of contrast media per Radiology protocol    Answer:   Yes    Order Specific Question:   Does the patient have a contrast media/X-ray dye allergy?    Answer:   No    Order Specific Question:   Preferred imaging location?    Answer:   [facility]   CT Abdomen Pelvis W Contrast    Standing Status:   Future    Standing Expiration Date:   12/22/2022    Order Specific Question:   If indicated for the ordered procedure, I authorize the administration of contrast media per Radiology protocol    Answer:   Yes    Order Specific Question:   Does the patient have a contrast media/X-ray dye allergy?    Answer:   No    Order Specific Question:   Preferred imaging location?    Answer:   [facility]    Order Specific Question:   Is Oral  Contrast requested for this exam?    Answer:   Yes, Per Radiology protocol   Urinalysis, Complete w Microscopic    Standing Status:   Future    Standing Expiration Date:   12/23/2022      The total time spent  in the appointment was 20-29 minutes  Selma Rodelo L Netanya Yazdani, [name] 12/22/21

## 2021-12-22 ENCOUNTER — Inpatient Hospital Stay: Payer: No Typology Code available for payment source

## 2021-12-22 ENCOUNTER — Inpatient Hospital Stay (HOSPITAL_BASED_OUTPATIENT_CLINIC_OR_DEPARTMENT_OTHER): Payer: No Typology Code available for payment source | Admitting: Physician Assistant

## 2021-12-22 ENCOUNTER — Inpatient Hospital Stay: Payer: No Typology Code available for payment source | Attending: Internal Medicine

## 2021-12-22 VITALS — HR 100

## 2021-12-22 VITALS — BP 112/80 | HR 108 | Temp 98.0°F | Resp 18 | Wt 189.5 lb

## 2021-12-22 DIAGNOSIS — Z79899 Other long term (current) drug therapy: Secondary | ICD-10-CM | POA: Insufficient documentation

## 2021-12-22 DIAGNOSIS — R300 Dysuria: Secondary | ICD-10-CM | POA: Insufficient documentation

## 2021-12-22 DIAGNOSIS — C3491 Malignant neoplasm of unspecified part of right bronchus or lung: Secondary | ICD-10-CM | POA: Insufficient documentation

## 2021-12-22 DIAGNOSIS — Z86711 Personal history of pulmonary embolism: Secondary | ICD-10-CM | POA: Insufficient documentation

## 2021-12-22 DIAGNOSIS — Z5111 Encounter for antineoplastic chemotherapy: Secondary | ICD-10-CM

## 2021-12-22 DIAGNOSIS — C3490 Malignant neoplasm of unspecified part of unspecified bronchus or lung: Secondary | ICD-10-CM | POA: Diagnosis not present

## 2021-12-22 DIAGNOSIS — Z7901 Long term (current) use of anticoagulants: Secondary | ICD-10-CM | POA: Insufficient documentation

## 2021-12-22 DIAGNOSIS — B370 Candidal stomatitis: Secondary | ICD-10-CM | POA: Diagnosis not present

## 2021-12-22 DIAGNOSIS — Z923 Personal history of irradiation: Secondary | ICD-10-CM | POA: Insufficient documentation

## 2021-12-22 LAB — URINALYSIS, COMPLETE (UACMP) WITH MICROSCOPIC
Bacteria, UA: NONE SEEN
Glucose, UA: NEGATIVE mg/dL
Ketones, ur: NEGATIVE mg/dL
Leukocytes,Ua: NEGATIVE
Nitrite: NEGATIVE
Protein, ur: NEGATIVE mg/dL
RBC / HPF: >50 RBC/hpf — ABNORMAL HIGH (ref 0–5)
Specific Gravity, Urine: 1.015 (ref 1.005–1.030)
pH: 5 (ref 5.0–8.0)

## 2021-12-22 LAB — CMP (CANCER CENTER ONLY)
ALT: 16 U/L (ref 0–44)
AST: 22 U/L (ref 15–41)
Albumin: 3.8 g/dL (ref 3.5–5.0)
Alkaline Phosphatase: 114 U/L (ref 38–126)
Anion gap: 8 (ref 5–15)
BUN: 21 mg/dL (ref 8–23)
CO2: 26 mmol/L (ref 22–32)
Calcium: 9.7 mg/dL (ref 8.9–10.3)
Chloride: 103 mmol/L (ref 98–111)
Creatinine: 0.94 mg/dL (ref 0.61–1.24)
GFR, Estimated: >60 mL/min (ref >60)
Glucose, Bld: 107 mg/dL — ABNORMAL HIGH (ref 70–99)
Potassium: 4.3 mmol/L (ref 3.5–5.1)
Sodium: 137 mmol/L (ref 135–145)
Total Bilirubin: 0.5 mg/dL (ref 0.3–1.2)
Total Protein: 7.7 g/dL (ref 6.5–8.1)

## 2021-12-22 LAB — CBC WITH DIFFERENTIAL (CANCER CENTER ONLY)
Abs Immature Granulocytes: 0.04 10*3/uL (ref 0.00–0.07)
Basophils Absolute: 0.1 10*3/uL (ref 0.0–0.1)
Basophils Relative: 1 %
Eosinophils Absolute: 0.1 10*3/uL (ref 0.0–0.5)
Eosinophils Relative: 1 %
HCT: 43.4 % (ref 39.0–52.0)
Hemoglobin: 14.7 g/dL (ref 13.0–17.0)
Immature Granulocytes: 1 %
Lymphocytes Relative: 13 %
Lymphs Abs: 0.8 10*3/uL (ref 0.7–4.0)
MCH: 32.5 pg (ref 26.0–34.0)
MCHC: 33.9 g/dL (ref 30.0–36.0)
MCV: 96 fL (ref 80.0–100.0)
Monocytes Absolute: 0.9 10*3/uL (ref 0.1–1.0)
Monocytes Relative: 13 %
Neutro Abs: 4.6 10*3/uL (ref 1.7–7.7)
Neutrophils Relative %: 71 %
Platelet Count: 308 10*3/uL (ref 150–400)
RBC: 4.52 MIL/uL (ref 4.22–5.81)
RDW: 13.7 % (ref 11.5–15.5)
WBC Count: 6.5 10*3/uL (ref 4.0–10.5)
nRBC: 0 % (ref 0.0–0.2)

## 2021-12-22 LAB — TSH: TSH: 2.681 u[IU]/mL (ref 0.350–4.500)

## 2021-12-22 MED ORDER — FLUCONAZOLE 100 MG PO TABS: 100.0000 mg | ORAL_TABLET | Freq: Every day | ORAL | 0 refills | Status: DC

## 2021-12-22 MED ORDER — CYANOCOBALAMIN 1000 MCG/ML IJ SOLN
1000.0000 ug | Freq: Once | INTRAMUSCULAR | Status: AC
  Administered 2021-12-22: 1000 ug via INTRAMUSCULAR
  Filled 2021-12-22: qty 1

## 2021-12-22 MED ORDER — SODIUM CHLORIDE 0.9 % IV SOLN: Freq: Once | INTRAVENOUS | Status: AC

## 2021-12-22 MED ORDER — SODIUM CHLORIDE 0.9 % IV SOLN
500.0000 mg/m2 | Freq: Once | INTRAVENOUS | Status: AC
  Administered 2021-12-22: 1000 mg via INTRAVENOUS
  Filled 2021-12-22: qty 40

## 2021-12-22 MED ORDER — PROCHLORPERAZINE MALEATE 10 MG PO TABS
10.0000 mg | ORAL_TABLET | Freq: Once | ORAL | Status: AC
  Administered 2021-12-22: 10 mg via ORAL
  Filled 2021-12-22: qty 1

## 2021-12-22 NOTE — Patient Instructions (Signed)
[facility]   Discharge Instructions: Thank you for choosing [facility] to provide your oncology and hematology care.   If you have a lab appointment with the [facility], please go directly to the [facility] and check in at the registration area.   Wear comfortable clothing and clothing appropriate for easy access to any Portacath or PICC line.   We strive to give you quality time with your provider. You may need to reschedule your appointment if you arrive late (15 or more minutes).  Arriving late affects you and other patients whose appointments are after yours.  Also, if you miss three or more appointments without notifying the office, you may be dismissed from the clinic at the provider's discretion.      For prescription refill requests, have your pharmacy contact our office and allow 72 hours for refills to be completed.    Today you received the following chemotherapy and/or immunotherapy agents: Pemetrexed (Alimta)      To help prevent nausea and vomiting after your treatment, we encourage you to take your nausea medication as directed.  BELOW ARE SYMPTOMS THAT SHOULD BE REPORTED IMMEDIATELY: *FEVER GREATER THAN 100.4 F (38 C) OR HIGHER *CHILLS OR SWEATING *NAUSEA AND VOMITING THAT IS NOT CONTROLLED WITH YOUR NAUSEA MEDICATION *UNUSUAL SHORTNESS OF BREATH *UNUSUAL BRUISING OR BLEEDING *URINARY PROBLEMS (pain or burning when urinating, or frequent urination) *BOWEL PROBLEMS (unusual diarrhea, constipation, pain near the anus) TENDERNESS IN MOUTH AND THROAT WITH OR WITHOUT PRESENCE OF ULCERS (sore throat, sores in mouth, or a toothache) UNUSUAL RASH, SWELLING OR PAIN  UNUSUAL VAGINAL DISCHARGE OR ITCHING   Items with * indicate a potential emergency and should be followed up as soon as possible or go to the Emergency Department if any problems should occur.  Please show the CHEMOTHERAPY ALERT CARD or IMMUNOTHERAPY ALERT CARD at  check-in to the Emergency Department and triage nurse.  Should you have questions after your visit or need to cancel or reschedule your appointment, please contact [facility]  Dept: [phone]  and follow the prompts.  Office hours are 8:00 a.m. to 4:30 p.m. Monday - Friday. Please note that voicemails left after 4:00 p.m. may not be returned until the following business day.  We are closed weekends and major holidays. You have access to a nurse at all times for urgent questions. Please call the main number to the clinic Dept: [phone] and follow the prompts.   For any non-urgent questions, you may also contact your provider using MyChart. We now offer e-Visits for anyone 74 and older to request care online for non-urgent symptoms. For details visit [email or web address].   Also download the MyChart app! Go to the app store, search "MyChart", open the app, select [facility], and log in with your MyChart username and password.  Masks are optional in the cancer centers. If you would like for your care team to wear a mask while they are taking care of you, please let them know. You may have one support Kevionna Heffler who is at least 66 years old accompany you for your appointments.

## 2021-12-23 ENCOUNTER — Telehealth: Payer: Self-pay

## 2021-12-23 NOTE — Telephone Encounter (Signed)
This nurse attempted to reach patient related to lab results and provider recommendations.  This nurse will  send a My Chart message for patient.  No further questions or concerns at this time.

## 2021-12-24 LAB — URINE CULTURE: Culture: NO GROWTH

## 2021-12-24 LAB — T4: T4, Total: 9.9 ug/dL (ref 4.5–12.0)

## 2022-01-05 ENCOUNTER — Encounter: Payer: Self-pay | Admitting: Pulmonary Disease

## 2022-01-05 ENCOUNTER — Ambulatory Visit (INDEPENDENT_AMBULATORY_CARE_PROVIDER_SITE_OTHER): Payer: No Typology Code available for payment source | Admitting: Pulmonary Disease

## 2022-01-05 VITALS — BP 120/68 | HR 113 | Temp 98.6°F | Wt 186.6 lb

## 2022-01-05 DIAGNOSIS — J449 Chronic obstructive pulmonary disease, unspecified: Secondary | ICD-10-CM | POA: Diagnosis not present

## 2022-01-05 MED ORDER — BREZTRI AEROSPHERE 160-9-4.8 MCG/ACT IN AERO: 2.0000 | INHALATION_SPRAY | Freq: Two times a day (BID) | RESPIRATORY_TRACT | 3 refills | Status: DC

## 2022-01-05 MED ORDER — ALBUTEROL SULFATE HFA 108 (90 BASE) MCG/ACT IN AERS: 2.0000 | INHALATION_SPRAY | RESPIRATORY_TRACT | 11 refills | Status: DC | PRN

## 2022-01-05 MED ORDER — BREZTRI AEROSPHERE 160-9-4.8 MCG/ACT IN AERO: 2.0000 | INHALATION_SPRAY | Freq: Two times a day (BID) | RESPIRATORY_TRACT | 0 refills | Status: DC

## 2022-01-05 NOTE — Progress Notes (Signed)
Synopsis: Referred in January 2020 for pulmonary MAC by [name], MD.  Previously seen by Dr. Adair Laundry.  Previously seen by Dr. Valeta Harms.  Subjective:   PATIENT ID: Philip [name] GENDER: male DOB: Aug 19, 1955, MRN: [id number]  Chief Complaint  Patient presents with   Follow-up    Follow up for acute pulm embolism. Pt states he is taking the xarelto daily and having a few issues with brusing and some bleeding when he his his hands.Pt states that the Stiolto is working good. And he states that he is having having issues getting his albuterol.     Philip [name] is a 66 y.o.. gentleman with a history of stage III lung adenocarcinoma, COPD who presents for routine follow up.    Still with significant dyspnea.  Hard to say if stable or worsened.  But certainly a major issue with work, and day-to-day living.  Initially, felt like there was some improvement with Stiolto.  Now, today, upon further discussion, not so sure anymore.  Cough seems not a big issue.  Although he does cough a few times during the evaluation.  But seems like overall cough not as big of a deal as it once was.  We discussed role and rationale of escalate inhaler therapy.  We reviewed he has good reasons to be short of breath from a lung standpoint given his emphysema as well as significant radiation fibrosis.  Goal is to try to be aggressive treatment we can.        Past Medical History:  Diagnosis Date   COPD (chronic obstructive pulmonary disease) (HCC)    GERD (gastroesophageal reflux disease)    Headache    migraines   Hyperlipidemia    lung ca dx'd 12/2018   Pneumonia    Pulmonary embolus (HCC)    Tobacco abuse      Family History  Problem Relation Age of Onset   Cancer Father    Stomach cancer Father      Past Surgical History:  Procedure Laterality Date   COLONOSCOPY     CYSTOSCOPY W/ URETERAL STENT PLACEMENT Right 10/24/2021   Procedure: CYSTOSCOPY WITH RETROGRADE PYELOGRAM/URETERAL STENT  PLACEMENT;  Surgeon: Janith Lima, MD;  Location: [facility];  Service: Urology;  Laterality: Right;   HAND SURGERY     VIDEO BRONCHOSCOPY WITH ENDOBRONCHIAL ULTRASOUND N/A 12/22/2018   Procedure: VIDEO BRONCHOSCOPY WITH ENDOBRONCHIAL ULTRASOUND;  Surgeon: Garner Nash, DO;  Location: [name];  Service: Thoracic;  Laterality: N/A;   VIDEO BRONCHOSCOPY WITH ENDOBRONCHIAL ULTRASOUND N/A 01/09/2019   Procedure: VIDEO BRONCHOSCOPY WITH ENDOBRONCHIAL ULTRASOUND WITH FLUORO;  Surgeon: Garner Nash, DO;  Location: [facility];  Service: Thoracic;  Laterality: N/A;   VIDEO BRONCHOSCOPY WITH RADIAL ENDOBRONCHIAL ULTRASOUND N/A 12/22/2018   Procedure: RADIAL ENDOBRONCHIAL ULTRASOUND;  Surgeon: Garner Nash, DO;  Location: [facility];  Service: Thoracic;  Laterality: N/A;    Social History   Socioeconomic History   Marital status: Married    Spouse name: Not on file   Number of children: Not on file   Years of education: Not on file   Highest education level: Not on file  Occupational History   Not on file  Tobacco Use   Smoking status: Light Smoker    Years: 45.00    Types: Cigarettes    Start date: 07/04/1970   Smokeless tobacco: Former    Types: Chew   Tobacco comments:    Smoking 2 packs of cigarettes a week. 10/14/2021 Patty Sermons  Smoking is on and off, will go a week without smoking then start up and smoke half a pack a day  Vaping Use   Vaping Use: Never used  Substance and Sexual Activity   Alcohol use: Yes    Comment: hasn't drank in one month   Drug use: Not Currently    Types: Marijuana    Comment: rare edible use   Sexual activity: Not on file  Other Topics Concern   Not on file  Social History Narrative   Not on file   Social Determinants of Health   Financial Resource Strain: Not on file  Food Insecurity: Not on file  Transportation Needs: Not on file  Physical Activity: Not on file  Stress: Not on file  Social Connections: Not on file  Intimate Partner Violence: Not on  file     Allergies  Allergen Reactions   Carboplatin Anaphylaxis, Shortness Of Breath, Cough and Hypertension    On dose 8. Became short of breath. Symptom management was called. Received benadryl, solumedrol, Pepcid and fluids. Medication discontinued.    Klonopin [Clonazepam] Other (See Comments)    nervous   Norco [Hydrocodone-Acetaminophen] Other (See Comments)    Made pt feel jittery      Immunization History  Administered Date(s) Administered   Influenza Split 11/20/2008   Influenza,inj,Quad PF,6+ Mos 10/24/2018, 11/08/2018, 01/15/2020   Influenza,inj,quad, With Preservative 03/15/2015   PFIZER(Purple Top)SARS-COV-2 Vaccination 04/27/2019, 05/11/2019, 12/07/2019, 12/26/2019   PNEUMOCOCCAL CONJUGATE-20 05/12/2021   Pneumococcal Polysaccharide-23 12/12/2007, 10/10/2019   Td 05/18/2014   Tdap 11/16/2006, 05/18/2014    Outpatient Medications Prior to Visit  Medication Sig Dispense Refill   acetaminophen (TYLENOL) 500 MG tablet Take 500-1,000 mg by mouth every 6 (six) hours as needed (pain.).     dextromethorphan-guaiFENesin (MUCINEX DM) 30-600 MG 12hr tablet Take 1 tablet by mouth 2 (two) times daily as needed for cough. 40 tablet 0   famotidine (PEPCID) 20 MG tablet TAKE 1 TABLET BY MOUTH TWICE A DAY 371 tablet 1   folic acid (FOLVITE) 1 MG tablet TAKE 1 TABLET BY MOUTH EVERY DAY (Patient taking differently: Take 1 mg by mouth every evening.) 90 tablet 1   HYDROcodone-acetaminophen (NORCO) 5-325 MG tablet Take 1 tablet by mouth every 6 (six) hours as needed for moderate pain. 5 tablet 0   Multiple Vitamins-Minerals (MULTI ADULT GUMMIES) CHEW Chew 1 tablet by mouth in the morning and at bedtime.     naphazoline-pheniramine (VISINE) 0.025-0.3 % ophthalmic solution Place 1 drop into both eyes in the morning.     rivaroxaban (XARELTO) 20 MG TABS tablet Take 1 tablet (20 mg total) by mouth daily with supper. 30 tablet 11   tamsulosin (FLOMAX) 0.4 MG CAPS capsule Take 0.4 mg by mouth  at bedtime.     albuterol (VENTOLIN HFA) 108 (90 Base) MCG/ACT inhaler Inhale 2 puffs into the lungs every 4 (four) hours as needed for wheezing or shortness of breath. 18 g 6   Tiotropium Bromide-Olodaterol (STIOLTO RESPIMAT) 2.5-2.5 MCG/ACT AERS Inhale 2 puffs into the lungs daily. 1 each 11   fluconazole (DIFLUCAN) 100 MG tablet Take 1 tablet (100 mg total) by mouth daily. 10 tablet 0   No facility-administered medications prior to visit.   Review of systems: N/AA   Objective:   Vitals:   01/05/22 0840  BP: 120/68  Pulse: (!) 113  Temp: 98.6 F (37 C)  TempSrc: Oral  SpO2: 97%  Weight: 186 lb 9.6 oz (84.6 kg)   97%  on   RA BMI Readings from Last 3 Encounters:  01/05/22 30.12 kg/m  12/22/21 30.59 kg/m  12/01/21 30.84 kg/m   Wt Readings from Last 3 Encounters:  01/05/22 186 lb 9.6 oz (84.6 kg)  12/22/21 189 lb 8 oz (86 kg)  12/01/21 191 lb 1 oz (86.7 kg)    Physical Exam Vitals reviewed.  Constitutional:      General: He is not in acute distress.    Appearance: He is not ill-appearing.  HENT:     Head: Normocephalic and atraumatic.  Eyes:     General: No scleral icterus. Cardiovascular:     Rate and Rhythm: Normal rate and regular rhythm.     Heart sounds: No murmur heard. Pulmonary:     Comments: Breathing comfortably on room air, lungs clear, on room air Abdominal:     General: There is no distension.     Palpations: Abdomen is soft.     Tenderness: There is no abdominal tenderness.  Musculoskeletal:        General: No swelling or deformity.     Cervical back: Neck supple.  Lymphadenopathy:     Cervical: No cervical adenopathy.  Skin:    General: Skin is warm and dry.     Findings: No rash.  Neurological:     General: No focal deficit present.     Mental Status: He is alert.     Coordination: Coordination normal.  Psychiatric:        Mood and Affect: Mood normal.        Behavior: Behavior normal.      CBC    Component Value Date/Time    WBC 6.5 12/22/2021 1104   WBC 6.9 10/23/2021 2250   RBC 4.52 12/22/2021 1104   HGB 14.7 12/22/2021 1104   HCT 43.4 12/22/2021 1104   PLT 308 12/22/2021 1104   MCV 96.0 12/22/2021 1104   MCH 32.5 12/22/2021 1104   MCHC 33.9 12/22/2021 1104   RDW 13.7 12/22/2021 1104   LYMPHSABS 0.8 12/22/2021 1104   MONOABS 0.9 12/22/2021 1104   EOSABS 0.1 12/22/2021 1104   BASOSABS 0.1 12/22/2021 1104    CHEMISTRY No results for input(s): "NA", "K", "CL", "CO2", "GLUCOSE", "BUN", "CREATININE", "CALCIUM", "MG", "PHOS" in the last 168 hours. Estimated Creatinine Clearance: 78.8 mL/min (by C-G formula based on SCr of 0.94 mg/dL).   Chest Imaging- films reviewed: CTA PE protocol 01/2021 with fibrotic changes around initial mass and enlarging nodular mass over 2 cm  CT PET 01/03/2019-severe emphysema, right hilar mass with air bronchograms, medial RLL cystic fibrotic changes.  PET avidity of the inferior portion of the mass and medial fibrotic areas of the lower lobe.  CT chest with contrast 04/10/2018-persistent right mass, unchanged right paraseptal emphysema versus cavitary lesions.  Improved adenopathy.  CT chest with contrast 10/06/2019-bilateral centrilobular and paraseptal emphysema.  Increased opacification of right lower lobe cystic area in the medial base compared to March 2021, but improved compared to June 2021 CT.  Persistence of hilar mass on the right.  New irregularly shaped opacity in the left upper lobe.  Increased areas of patchy groundglass and septal thickening in the right upper lobe.  Micro: 01/09/2019 AFB: Component 1 mo ago  Organism ID [id number]    Comment: Mycobacterium avium complex  Amikacin Comment   Comment: 16.0 ug/mL Susceptible  Clarithromycin 2.0 ug/mL Susceptible   Linezolid Comment   Comment: 16.0 ug/mL Intermediate  Moxifloxacin 4.0 ug/mL Resistant   Streptomycin 64.0 ug/mL  01/09/2019 fungus culture-negative 01/09/2019 BAL-many PMN, negative  culture 06/17/2019 respiratory culture-normal flora  Pulmonary Functions Testing Results:    Latest Ref Rng & Units 12/21/2018   12:38 PM  PFT Results  FVC-Pre L 3.07   FVC-Predicted Pre % 76   FVC-Post L 3.23   FVC-Predicted Post % 80   Pre FEV1/FVC % % 67   Post FEV1/FCV % % 71   FEV1-Pre L 2.07   FEV1-Predicted Pre % 68   FEV1-Post L 2.28   DLCO uncorrected ml/min/mmHg 15.47   DLCO UNC% % 64   DLVA Predicted % 79   TLC L 5.35   TLC % Predicted % 86   RV % Predicted % 104    2020- mild obstruction without significant bronchodilator reversibility.  No significant restriction, air trapping, or hyperinflation.  Mildly reduced diffusion.  Flow volume loop supports obstruction.  Pathology 01/09/2019:  RLL adenocarcinoma November 2020 bronch nondiagnostic     Assessment & Plan:     ICD-10-CM   1. Chronic obstructive pulmonary disease, unspecified COPD type (HCC)  J44.9 albuterol (VENTOLIN HFA) 108 (90 Base) MCG/ACT inhaler    Budeson-Glycopyrrol-Formoterol (BREZTRI AEROSPHERE) 160-9-4.8 MCG/ACT AERO       Lung adenocarcinoma- stage IIIc (T4N3M0).  Previously received carboplatin, paclitaxel, and radiation from Dec 2020-  mid March 2021, imfimzi 02 May 2019.  Unfortunate recurrence fall 2022 now on maintenance chemotherapy, carboplatin stopped after second cycle given anaphylaxis. -Ongoing management per oncology.   COPD- partial BD reversibility. Ongoing tobacco abuse. 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 Resistant to quinolones; intermediate susceptibility to linezolid.  Cough not a problem, low suspicion for active disease. - Traditionally, would avoid ICS but given his ongoing symptoms of shortness of breath, okay to trial and if no improvement return to using Stiolto -PLEASE AVOID MONOTHERAPY WITH MACROLIDES  DUE TO RISK OF INDUCIBLE RESISTANCE OF MAI.  Tobacco abuse -Discussed importance of quitting smoking.  Recommend use of nicotine replacement therapy, especially using gum if stress is a situational trigger.  Pulmonary embolus:  -Provoked in setting malignancy.  Xarelto to continue indefinitely in the setting of malignancy.    RTC in 3 months with Dr. [name].     Current Outpatient Medications:    acetaminophen (TYLENOL) 500 MG tablet, Take 500-1,000 mg by mouth every 6 (six) hours as needed (pain.)., Disp: , Rfl:    Budeson-Glycopyrrol-Formoterol (BREZTRI AEROSPHERE) 160-9-4.8 MCG/ACT AERO, Inhale 2 puffs into the lungs in the morning and at bedtime., Disp: 1 each, Rfl: 3   dextromethorphan-guaiFENesin (MUCINEX DM) 30-600 MG 12hr tablet, Take 1 tablet by mouth 2 (two) times daily as needed for cough., Disp: 40 tablet, Rfl: 0   famotidine (PEPCID) 20 MG tablet, TAKE 1 TABLET BY MOUTH TWICE A DAY, Disp: 180 tablet, Rfl: 1   folic acid (FOLVITE) 1 MG tablet, TAKE 1 TABLET BY MOUTH EVERY DAY (Patient taking differently: Take 1 mg by mouth every evening.), Disp: 90 tablet, Rfl: 1   HYDROcodone-acetaminophen (NORCO) 5-325 MG tablet, Take 1 tablet by mouth every 6 (six) hours as needed for moderate pain., Disp: 5 tablet, Rfl: 0   Multiple Vitamins-Minerals (MULTI ADULT GUMMIES) CHEW, Chew 1 tablet by mouth in the morning and at bedtime., Disp: , Rfl:    naphazoline-pheniramine (VISINE) 0.025-0.3 % ophthalmic solution, Place 1 drop into both eyes in the morning., Disp: ,  Rfl:    rivaroxaban (XARELTO) 20 MG TABS tablet, Take 1 tablet (20 mg total) by mouth daily with supper., Disp: 30 tablet, Rfl: 11   tamsulosin (FLOMAX) 0.4 MG CAPS capsule, Take 0.4 mg by mouth at bedtime., Disp: , Rfl:    albuterol (VENTOLIN HFA) 108 (90 Base) MCG/ACT inhaler, Inhale 2 puffs into the lungs every 4 (four) hours as needed for wheezing or shortness of breath., Disp: 18 g, Rfl: 11    Lanier Clam,   MD [facility] 01/05/2022 9:08 AM

## 2022-01-05 NOTE — Addendum Note (Signed)
Addended by: Monna Fam L on: 01/05/2022 04:10 PM   Modules accepted: Orders

## 2022-01-05 NOTE — Patient Instructions (Signed)
Nice to see you again  Lets try the inhaler, use Breztri 2 puffs twice a day.  Every day.  Rinse your mouth out with water after every use.  You can stop Stiolto once you start this.  The new inhaler add an inhaled steroid to see if decreasing inflammation can help with some of your shortness of breath while still continuing similar medicines that was in Stiolto to help keep the airways open and help your shortness of breath.  I provided samples today.  Please keep an eye on the top of the inhaler there is a dose counter.  Each sample inhaler should last 7 days.  In addition, sent a prescription to your pharmacy.  Return to clinic in 3 months or sooner as needed with Dr. [name]

## 2022-01-07 ENCOUNTER — Ambulatory Visit (HOSPITAL_BASED_OUTPATIENT_CLINIC_OR_DEPARTMENT_OTHER)
Admission: RE | Admit: 2022-01-07 | Discharge: 2022-01-07 | Disposition: A | Discharge status: Home / Self Care | Payer: No Typology Code available for payment source | Source: Ambulatory Visit | Attending: Physician Assistant | Admitting: Physician Assistant

## 2022-01-07 DIAGNOSIS — C3490 Malignant neoplasm of unspecified part of unspecified bronchus or lung: Secondary | ICD-10-CM | POA: Insufficient documentation

## 2022-01-07 MED ORDER — IOHEXOL 300 MG/ML  SOLN
100.0000 mL | Freq: Once | INTRAMUSCULAR | Status: AC | PRN
  Administered 2022-01-07: 80 mL via INTRAVENOUS

## 2022-01-12 ENCOUNTER — Inpatient Hospital Stay: Payer: No Typology Code available for payment source | Admitting: Internal Medicine

## 2022-01-12 ENCOUNTER — Inpatient Hospital Stay: Payer: No Typology Code available for payment source

## 2022-01-12 ENCOUNTER — Inpatient Hospital Stay (HOSPITAL_COMMUNITY)
Admission: EM | Admit: 2022-01-12 | Discharge: 2022-01-22 | DRG: 193 | Disposition: A | Discharge status: Home / Self Care | Payer: No Typology Code available for payment source | Attending: Internal Medicine | Admitting: Internal Medicine

## 2022-01-12 ENCOUNTER — Other Ambulatory Visit: Payer: Self-pay

## 2022-01-12 ENCOUNTER — Emergency Department (HOSPITAL_COMMUNITY): Payer: No Typology Code available for payment source

## 2022-01-12 ENCOUNTER — Telehealth: Payer: Self-pay | Admitting: Medical Oncology

## 2022-01-12 ENCOUNTER — Encounter (HOSPITAL_COMMUNITY): Payer: Self-pay

## 2022-01-12 DIAGNOSIS — K219 Gastro-esophageal reflux disease without esophagitis: Secondary | ICD-10-CM | POA: Insufficient documentation

## 2022-01-12 DIAGNOSIS — I959 Hypotension, unspecified: Secondary | ICD-10-CM | POA: Diagnosis not present

## 2022-01-12 DIAGNOSIS — J440 Chronic obstructive pulmonary disease with acute lower respiratory infection: Secondary | ICD-10-CM | POA: Diagnosis present

## 2022-01-12 DIAGNOSIS — C3490 Malignant neoplasm of unspecified part of unspecified bronchus or lung: Secondary | ICD-10-CM

## 2022-01-12 DIAGNOSIS — G43909 Migraine, unspecified, not intractable, without status migrainosus: Secondary | ICD-10-CM | POA: Diagnosis present

## 2022-01-12 DIAGNOSIS — J9621 Acute and chronic respiratory failure with hypoxia: Secondary | ICD-10-CM | POA: Diagnosis present

## 2022-01-12 DIAGNOSIS — Z86711 Personal history of pulmonary embolism: Secondary | ICD-10-CM

## 2022-01-12 DIAGNOSIS — E785 Hyperlipidemia, unspecified: Secondary | ICD-10-CM | POA: Diagnosis present

## 2022-01-12 DIAGNOSIS — Z886 Allergy status to analgesic agent status: Secondary | ICD-10-CM

## 2022-01-12 DIAGNOSIS — Z79899 Other long term (current) drug therapy: Secondary | ICD-10-CM

## 2022-01-12 DIAGNOSIS — Z9981 Dependence on supplemental oxygen: Secondary | ICD-10-CM

## 2022-01-12 DIAGNOSIS — Z7901 Long term (current) use of anticoagulants: Secondary | ICD-10-CM | POA: Diagnosis not present

## 2022-01-12 DIAGNOSIS — J441 Chronic obstructive pulmonary disease with (acute) exacerbation: Secondary | ICD-10-CM | POA: Diagnosis present

## 2022-01-12 DIAGNOSIS — N400 Enlarged prostate without lower urinary tract symptoms: Secondary | ICD-10-CM | POA: Diagnosis present

## 2022-01-12 DIAGNOSIS — M7989 Other specified soft tissue disorders: Secondary | ICD-10-CM | POA: Diagnosis present

## 2022-01-12 DIAGNOSIS — D849 Immunodeficiency, unspecified: Secondary | ICD-10-CM | POA: Diagnosis present

## 2022-01-12 DIAGNOSIS — J189 Pneumonia, unspecified organism: Secondary | ICD-10-CM | POA: Diagnosis not present

## 2022-01-12 DIAGNOSIS — Z9221 Personal history of antineoplastic chemotherapy: Secondary | ICD-10-CM | POA: Diagnosis not present

## 2022-01-12 DIAGNOSIS — Z923 Personal history of irradiation: Secondary | ICD-10-CM | POA: Diagnosis not present

## 2022-01-12 DIAGNOSIS — F17210 Nicotine dependence, cigarettes, uncomplicated: Secondary | ICD-10-CM | POA: Diagnosis present

## 2022-01-12 DIAGNOSIS — Z800 Family history of malignant neoplasm of digestive organs: Secondary | ICD-10-CM | POA: Diagnosis not present

## 2022-01-12 DIAGNOSIS — R6510 Systemic inflammatory response syndrome (SIRS) of non-infectious origin without acute organ dysfunction: Secondary | ICD-10-CM | POA: Insufficient documentation

## 2022-01-12 DIAGNOSIS — Z888 Allergy status to other drugs, medicaments and biological substances status: Secondary | ICD-10-CM | POA: Diagnosis not present

## 2022-01-12 DIAGNOSIS — C3492 Malignant neoplasm of unspecified part of left bronchus or lung: Secondary | ICD-10-CM | POA: Diagnosis not present

## 2022-01-12 DIAGNOSIS — Z720 Tobacco use: Secondary | ICD-10-CM | POA: Insufficient documentation

## 2022-01-12 DIAGNOSIS — J449 Chronic obstructive pulmonary disease, unspecified: Secondary | ICD-10-CM | POA: Diagnosis present

## 2022-01-12 DIAGNOSIS — A419 Sepsis, unspecified organism: Principal | ICD-10-CM | POA: Diagnosis present

## 2022-01-12 DIAGNOSIS — C3491 Malignant neoplasm of unspecified part of right bronchus or lung: Secondary | ICD-10-CM | POA: Diagnosis not present

## 2022-01-12 DIAGNOSIS — Y842 Radiological procedure and radiotherapy as the cause of abnormal reaction of the patient, or of later complication, without mention of misadventure at the time of the procedure: Secondary | ICD-10-CM | POA: Diagnosis present

## 2022-01-12 DIAGNOSIS — J9600 Acute respiratory failure, unspecified whether with hypoxia or hypercapnia: Secondary | ICD-10-CM | POA: Diagnosis present

## 2022-01-12 DIAGNOSIS — Z1152 Encounter for screening for COVID-19: Secondary | ICD-10-CM

## 2022-01-12 DIAGNOSIS — J9611 Chronic respiratory failure with hypoxia: Secondary | ICD-10-CM | POA: Diagnosis not present

## 2022-01-12 DIAGNOSIS — I2690 Septic pulmonary embolism without acute cor pulmonale: Secondary | ICD-10-CM | POA: Diagnosis not present

## 2022-01-12 DIAGNOSIS — J9601 Acute respiratory failure with hypoxia: Secondary | ICD-10-CM | POA: Diagnosis not present

## 2022-01-12 DIAGNOSIS — I2699 Other pulmonary embolism without acute cor pulmonale: Secondary | ICD-10-CM | POA: Diagnosis present

## 2022-01-12 LAB — BASIC METABOLIC PANEL
Anion gap: 12 (ref 5–15)
BUN: 16 mg/dL (ref 8–23)
CO2: 22 mmol/L (ref 22–32)
Calcium: 9.3 mg/dL (ref 8.9–10.3)
Chloride: 103 mmol/L (ref 98–111)
Creatinine, Ser: 1.24 mg/dL (ref 0.61–1.24)
GFR, Estimated: >60 mL/min (ref >60)
Glucose, Bld: 132 mg/dL — ABNORMAL HIGH (ref 70–99)
Potassium: 4.5 mmol/L (ref 3.5–5.1)
Sodium: 137 mmol/L (ref 135–145)

## 2022-01-12 LAB — BLOOD GAS, VENOUS
Acid-Base Excess: 4 mmol/L — ABNORMAL HIGH (ref 0.0–2.0)
Bicarbonate: 27.6 mmol/L (ref 20.0–28.0)
O2 Saturation: 72.9 %
Patient temperature: 37
pCO2, Ven: 37 mmHg — ABNORMAL LOW (ref 44–60)
pH, Ven: 7.48 — ABNORMAL HIGH (ref 7.25–7.43)
pO2, Ven: 44 mmHg (ref 32–45)

## 2022-01-12 LAB — CBC WITH DIFFERENTIAL/PLATELET
Abs Immature Granulocytes: 0.06 10*3/uL (ref 0.00–0.07)
Basophils Absolute: 0.1 10*3/uL (ref 0.0–0.1)
Basophils Relative: 1 %
Eosinophils Absolute: 0 10*3/uL (ref 0.0–0.5)
Eosinophils Relative: 0 %
HCT: 43.5 % (ref 39.0–52.0)
Hemoglobin: 14 g/dL (ref 13.0–17.0)
Immature Granulocytes: 1 %
Lymphocytes Relative: 7 %
Lymphs Abs: 0.6 10*3/uL — ABNORMAL LOW (ref 0.7–4.0)
MCH: 31.3 pg (ref 26.0–34.0)
MCHC: 32.2 g/dL (ref 30.0–36.0)
MCV: 97.1 fL (ref 80.0–100.0)
Monocytes Absolute: 0.8 10*3/uL (ref 0.1–1.0)
Monocytes Relative: 10 %
Neutro Abs: 6.5 10*3/uL (ref 1.7–7.7)
Neutrophils Relative %: 81 %
Platelets: 381 10*3/uL (ref 150–400)
RBC: 4.48 MIL/uL (ref 4.22–5.81)
RDW: 14.2 % (ref 11.5–15.5)
WBC: 8.1 10*3/uL (ref 4.0–10.5)
nRBC: 0 % (ref 0.0–0.2)

## 2022-01-12 LAB — TROPONIN I (HIGH SENSITIVITY)
Troponin I (High Sensitivity): 14 ng/L (ref <18)
Troponin I (High Sensitivity): 9 ng/L (ref <18)

## 2022-01-12 LAB — RESP PANEL BY RT-PCR (RSV, FLU A&B, COVID)  RVPGX2
Influenza A by PCR: NEGATIVE
Influenza B by PCR: NEGATIVE
Resp Syncytial Virus by PCR: NEGATIVE
SARS Coronavirus 2 by RT PCR: NEGATIVE

## 2022-01-12 LAB — LACTIC ACID, PLASMA
Lactic Acid, Venous: 1.7 mmol/L (ref 0.5–1.9)
Lactic Acid, Venous: 2.4 mmol/L (ref 0.5–1.9)
Lactic Acid, Venous: 2.2 mmol/L (ref 0.5–1.9)

## 2022-01-12 LAB — BRAIN NATRIURETIC PEPTIDE: B Natriuretic Peptide: 104.1 pg/mL — ABNORMAL HIGH (ref 0.0–100.0)

## 2022-01-12 LAB — APTT: aPTT: 46 seconds — ABNORMAL HIGH (ref 24–36)

## 2022-01-12 LAB — PROTIME-INR
INR: 2 — ABNORMAL HIGH (ref 0.8–1.2)
Prothrombin Time: 22.6 seconds — ABNORMAL HIGH (ref 11.4–15.2)

## 2022-01-12 MED ORDER — TAMSULOSIN HCL 0.4 MG PO CAPS
0.4000 mg | ORAL_CAPSULE | Freq: Every day | ORAL | Status: DC
  Administered 2022-01-12 – 2022-01-21 (×10): 0.4 mg via ORAL
  Filled 2022-01-12 (×10): qty 1

## 2022-01-12 MED ORDER — PANTOPRAZOLE SODIUM 40 MG PO TBEC
40.0000 mg | DELAYED_RELEASE_TABLET | Freq: Every day | ORAL | Status: DC
  Administered 2022-01-12: 40 mg via ORAL
  Filled 2022-01-12: qty 1

## 2022-01-12 MED ORDER — FAMOTIDINE 20 MG PO TABS
20.0000 mg | ORAL_TABLET | Freq: Two times a day (BID) | ORAL | Status: DC
  Administered 2022-01-12 – 2022-01-22 (×20): 20 mg via ORAL
  Filled 2022-01-12 (×20): qty 1

## 2022-01-12 MED ORDER — GUAIFENESIN ER 600 MG PO TB12
1200.0000 mg | ORAL_TABLET | Freq: Two times a day (BID) | ORAL | Status: DC
  Administered 2022-01-12 – 2022-01-22 (×20): 1200 mg via ORAL
  Filled 2022-01-12 (×20): qty 2

## 2022-01-12 MED ORDER — LACTATED RINGERS IV BOLUS
500.0000 mL | Freq: Once | INTRAVENOUS | Status: AC
  Administered 2022-01-12: 500 mL via INTRAVENOUS

## 2022-01-12 MED ORDER — IPRATROPIUM-ALBUTEROL 0.5-2.5 (3) MG/3ML IN SOLN
3.0000 mL | Freq: Four times a day (QID) | RESPIRATORY_TRACT | Status: DC
  Administered 2022-01-12 – 2022-01-21 (×36): 3 mL via RESPIRATORY_TRACT
  Filled 2022-01-12 (×35): qty 3

## 2022-01-12 MED ORDER — LACTATED RINGERS IV BOLUS
1000.0000 mL | Freq: Once | INTRAVENOUS | Status: AC
  Administered 2022-01-12: 1000 mL via INTRAVENOUS

## 2022-01-12 MED ORDER — PROCHLORPERAZINE EDISYLATE 10 MG/2ML IJ SOLN
10.0000 mg | Freq: Four times a day (QID) | INTRAMUSCULAR | Status: DC | PRN
  Administered 2022-01-20: 10 mg via INTRAVENOUS
  Filled 2022-01-12: qty 2

## 2022-01-12 MED ORDER — ACETAMINOPHEN 650 MG RE SUPP: 650.0000 mg | Freq: Four times a day (QID) | RECTAL | Status: DC | PRN

## 2022-01-12 MED ORDER — SODIUM CHLORIDE 0.9 % IV SOLN
2.0000 g | INTRAVENOUS | Status: DC
  Administered 2022-01-12 – 2022-01-15 (×4): 2 g via INTRAVENOUS
  Filled 2022-01-12 (×4): qty 20

## 2022-01-12 MED ORDER — FOLIC ACID 1 MG PO TABS
1.0000 mg | ORAL_TABLET | Freq: Every evening | ORAL | Status: DC
  Administered 2022-01-12 – 2022-01-21 (×10): 1 mg via ORAL
  Filled 2022-01-12 (×10): qty 1

## 2022-01-12 MED ORDER — RIVAROXABAN 20 MG PO TABS
20.0000 mg | ORAL_TABLET | Freq: Every day | ORAL | Status: DC
  Administered 2022-01-12 – 2022-01-21 (×10): 20 mg via ORAL
  Filled 2022-01-12 (×10): qty 1

## 2022-01-12 MED ORDER — LACTATED RINGERS IV SOLN: INTRAVENOUS | Status: DC

## 2022-01-12 MED ORDER — SODIUM CHLORIDE 0.9 % IV SOLN
2.0000 g | Freq: Once | INTRAVENOUS | Status: AC
  Administered 2022-01-12: 2 g via INTRAVENOUS
  Filled 2022-01-12: qty 12.5

## 2022-01-12 MED ORDER — SODIUM CHLORIDE 0.9 % IV SOLN
500.0000 mg | Freq: Once | INTRAVENOUS | Status: AC
  Administered 2022-01-12: 500 mg via INTRAVENOUS
  Filled 2022-01-12: qty 5

## 2022-01-12 MED ORDER — BENZONATATE 100 MG PO CAPS
200.0000 mg | ORAL_CAPSULE | Freq: Three times a day (TID) | ORAL | Status: DC | PRN
  Administered 2022-01-14: 200 mg via ORAL
  Filled 2022-01-12: qty 2

## 2022-01-12 MED ORDER — SODIUM CHLORIDE 0.9 % IV SOLN
500.0000 mg | INTRAVENOUS | Status: DC
  Administered 2022-01-13 – 2022-01-15 (×3): 500 mg via INTRAVENOUS
  Filled 2022-01-12 (×3): qty 5

## 2022-01-12 MED ORDER — ACETAMINOPHEN 325 MG PO TABS
650.0000 mg | ORAL_TABLET | Freq: Four times a day (QID) | ORAL | Status: DC | PRN
  Administered 2022-01-14 – 2022-01-15 (×2): 650 mg via ORAL
  Filled 2022-01-12 (×2): qty 2

## 2022-01-12 MED ORDER — NAPHAZOLINE-PHENIRAMINE 0.025-0.3 % OP SOLN: 1.0000 [drp] | Freq: Every day | OPHTHALMIC | Status: DC | PRN

## 2022-01-12 NOTE — ED Notes (Signed)
Pt placed on 2L of O2 at this time via [place] as he was dropping to the high 80's when sitting in the bed.

## 2022-01-12 NOTE — Progress Notes (Signed)
Notified provider and bedside nurse of need to order and draw repeat lactic acid #3.

## 2022-01-12 NOTE — ED Notes (Signed)
Blue top sent to lab. 

## 2022-01-12 NOTE — ED Notes (Signed)
Pt is aware that a urine sample is needed.  

## 2022-01-12 NOTE — ED Provider Triage Note (Signed)
Emergency Medicine Provider Triage Evaluation Note  Philip [name] , a 66 y.o. male  was evaluated in triage.  Pt complains of worsening shortness of breath over the past 2 weeks.  He had associated cough and feelings of being hot and cold.  He does have bilateral lower extremity swelling but he feels like this is improved from his baseline.  He does have a history of lung cancer.  Also has history of pulmonary embolism and is on Xarelto and has not missed any doses.  Review of Systems  Positive:  Negative:   Physical Exam  BP 117/75 (BP Location: Left Arm)   Pulse (!) 116   Resp (!) 23   SpO2 90%  Gen:   Awake, no distress   Resp:  Normal effort  MSK:   Moves extremities without difficulty  Other:  Bilateral 3+ pitting edema of lower extremity.  Lungs are clear to auscultation.  Medical Decision Making  Medically screening exam initiated at 8:40 AM.  Appropriate orders placed.  Philip [name] was informed that the remainder of the evaluation will be completed by another provider, this initial triage assessment does not replace that evaluation, and the importance of remaining in the ED until their evaluation is complete.     Philip [name], [place] 01/12/22 [phone]

## 2022-01-12 NOTE — Telephone Encounter (Signed)
"  Can't catch my breath" Pt in ED being workuped for pneumonia /PE -oxygen sats in 60"s. Appt cancelled.

## 2022-01-12 NOTE — ED Provider Notes (Signed)
[facility] DEPT Provider Note   CSN: [id number] Arrival date & time: 01/12/22  3220     History  Chief Complaint  Patient presents with   Shortness of Breath    Philip [name] is a 66 y.o. male with adenocarcinoma right lung stage III, history of pneumothorax, history of MAI infection, COPD, history of PE on Xarelto, presents with SOB.   Over last 2 weeks, patient complains of worsening shortness of breath especially with exertion but even with rest. Also c/o dizziness and fatigue a/w shortness of breath.  Also had chest pains this morning on the R side of the chest but it was relieved with antacid. States he has seen SpO2 sats at home in the 60s-70s yesterday. Denies orthopnea. Endorses cough but not necessarily worse than normal. Has been taking mucinex for his breathing which takes the cough away mostly. Endorsed some diaphoresis last night. Does not wear oxygen at home anymore. Had a CT Chest/abdomen/pelvis on Thursday. He does have bilateral lower extremity swelling but he feels like this is improved from his baseline. Also has history of pulmonary embolism and is on Xarelto and has not missed any doses. Recently got a new steroid inhaler last week but doesn't notice that it's helping at all. Currently receiving chemotherapy, last treatment was 3 weeks ago because today was supposed to be infusion day.  On interview, desats to 88% while talking.  Per chart review, patient CT chest abdomen pelvis on 12/6 demonstrated:  Chest Impression: 1. No evidence of lung cancer recurrence. 2. Stable postradiation change within the LEFT and RIGHT lung about the hila. 3. New interstitial thickening and ill-defined ground-glass densities/nodules lingula and lower lobe. Findings suggest pulmonary infection versus continue radiation change versus unlikely malignancy. Recommend clinical correlation for pulmonary infection and attention on follow-up. 4. Small  mediastinal lymph nodes are unchanged.  Abdomen / Pelvis Impression: 1. No evidence lung cancer metastasis in the abdomen pelvis. 2. Interval placement RIGHT double-J ureteral stent. No hydronephrosis. 3. No skeletal metastasis.     Shortness of Breath      Home Medications Prior to Admission medications   Medication Sig Start Date End Date Taking? Authorizing Provider  acetaminophen (TYLENOL) 500 MG tablet Take 500-1,000 mg by mouth every 6 (six) hours as needed (pain.).    [provider]  albuterol (VENTOLIN HFA) 108 (90 Base) MCG/ACT inhaler Inhale 2 puffs into the lungs every 4 (four) hours as needed for wheezing or shortness of breath. 01/05/22   Hunsucker, Bonna Gains, MD  Budeson-Glycopyrrol-Formoterol (BREZTRI AEROSPHERE) 160-9-4.8 MCG/ACT AERO Inhale 2 puffs into the lungs in the morning and at bedtime. 01/05/22   Hunsucker, Bonna Gains, MD  Budeson-Glycopyrrol-Formoterol (BREZTRI AEROSPHERE) 160-9-4.8 MCG/ACT AERO Inhale 2 puffs into the lungs in the morning and at bedtime. 01/05/22   Hunsucker, Bonna Gains, MD  dextromethorphan-guaiFENesin [facility] DM) 30-600 MG 12hr tablet Take 1 tablet by mouth 2 (two) times daily as needed for cough. 03/17/20   [name], MD  famotidine (PEPCID) 20 MG tablet TAKE 1 TABLET BY MOUTH TWICE A DAY 02/04/21   Heilingoetter, [name], [name]  folic acid (FOLVITE) 1 MG tablet TAKE 1 TABLET BY MOUTH EVERY DAY Patient taking differently: Take 1 mg by mouth every evening. 09/01/21   [name], MD  HYDROcodone-acetaminophen (NORCO) 5-325 MG tablet Take 1 tablet by mouth every 6 (six) hours as needed for moderate pain. 01/08/21   Barrie Folk, [name]  Multiple Vitamins-Minerals ([place]) Diona Fanti  1 tablet by mouth in the morning and at bedtime. 09/02/21   [provider]  naphazoline-pheniramine (VISINE) 0.025-0.3 % ophthalmic solution Place 1 drop into both eyes in the morning.    [provider]   rivaroxaban (XARELTO) 20 MG TABS tablet Take 1 tablet (20 mg total) by mouth daily with supper. 03/12/21   Hunsucker, Bonna Gains, MD  tamsulosin (FLOMAX) 0.4 MG CAPS capsule Take 0.4 mg by mouth at bedtime. 10/31/21   [provider]      Allergies    Carboplatin, Klonopin [clonazepam], and Norco [hydrocodone-acetaminophen]    Review of Systems   Review of Systems  Respiratory:  Positive for shortness of breath.    Review of systems Negative for fevers.  A 10 point review of systems was performed and is negative unless otherwise reported in HPI.  Physical Exam Updated Vital Signs BP 108/81   Pulse (!) 104   Temp 97.7 F (36.5 C) (Oral)   Resp 16   Ht 5\' 6"  (1.676 m)   Wt 82.6 kg   SpO2 90%   BMI 29.38 kg/m  Physical Exam General: Normal appearing male, lying in bed.  HEENT: PERRLA, Sclera anicteric, MMM, trachea midline.  Cardiology: RRR, no murmurs/rubs/gallops. BL radial and DP pulses equal bilaterally.  Resp: Normal respiratory rate and effort. CTAB, no wheezes, rhonchi, crackles.  Abd: Soft, non-tender, non-distended. No rebound tenderness or guarding.  GU: Deferred. MSK: No peripheral edema or signs of trauma. Extremities without deformity or TTP. No cyanosis or clubbing. Skin: warm, dry. No rashes or lesions. Back: No CVA tenderness Neuro: A&Ox4, CNs II-XII grossly intact. MAEs. Sensation grossly intact.  Psych: Normal mood and affect.   ED Results / Procedures / Treatments   Labs (all labs ordered are listed, but only abnormal results are displayed) Labs Reviewed  BASIC METABOLIC PANEL - Abnormal; Notable for the following components:      Result Value   Glucose, Bld 132 (*)    All other components within normal limits  CBC WITH DIFFERENTIAL/PLATELET - Abnormal; Notable for the following components:   Lymphs Abs 0.6 (*)    All other components within normal limits  RESP PANEL BY RT-PCR (RSV, FLU A&B, COVID)  RVPGX2  CULTURE, BLOOD (ROUTINE X 2)   CULTURE, BLOOD (ROUTINE X 2)  URINE CULTURE  BRAIN NATRIURETIC PEPTIDE  BLOOD GAS, VENOUS  LACTIC ACID, PLASMA  LACTIC ACID, PLASMA  PROTIME-INR  APTT  URINALYSIS, ROUTINE W REFLEX MICROSCOPIC  TROPONIN I (HIGH SENSITIVITY)    EKG None  Radiology DG Chest 2 View  Result Date: 01/12/2022 CLINICAL DATA:  Patient with history of lung cancer with Two-week history of shortness of breath EXAM: CHEST - 2 VIEW COMPARISON:  Chest radiograph dated 10/23/2021, CT chest dated 01/07/2022 FINDINGS: Low lung volumes with similar asymmetric elevation of the right hemidiaphragm. Similar appearance of perihilar consolidations. Increased bilateral interstitial and patchy left lung opacities. No pleural effusion or pneumothorax. The heart size and mediastinal contours are within normal limits. The visualized skeletal structures are unremarkable. Partially imaged catheter projects over the right lower hemiabdomen. IMPRESSION: 1. Increased bilateral interstitial and patchy left lung opacities, which could represent pulmonary edema or infection. 2. Similar appearance of perihilar consolidations, better evaluated as postradiation changes on prior chest CT. Electronically Signed   By: Darrin Nipper M.D.   On: 01/12/2022 08:49    Procedures Procedures    Medications Ordered in ED Medications  ceFEPIme (MAXIPIME) 2 g in sodium chloride 0.9 %  100 mL IVPB (has no administration in time range)  azithromycin (ZITHROMAX) 500 mg in sodium chloride 0.9 % 250 mL IVPB (has no administration in time range)  lactated ringers bolus 500 mL (has no administration in time range)    ED Course/ Medical Decision Making/ A&P                          Medical Decision Making Amount and/or Complexity of Data Reviewed Labs: ordered. Decision-making details documented in ED Course. Radiology: ordered. Decision-making details documented in ED Course.  Risk Prescription drug management. Decision regarding  hospitalization.    This patient presents to the ED for concern of SOB, this involves an extensive number of treatment options, and is a complaint that carries with it a high risk of complications and morbidity.  I considered the following differential and admission for this acute, potentially life threatening condition.  MDM:    DDX for dyspnea includes but is not limited to: Respiratory - Pneumonia /pulmonary edema given crackles on exam and swelling in bilateral lower extremities though patient states that improved from prior; pulmonary effusion, COPD though no wheezing on exam; PE especially given patient's history and ongoing malignancy, the patient is on Xarelto. Also consider radiation fibrosis.  Cardiac- CHF - considered given LEE and crackles on exam, however patient states that LEE is actually improved from baseline and there is no pulm edema demonstrated on prior CT or on CXR today. Last echo 2021 LVEF 60-65%, normal RV systolic function. BNP is slightly elevated but I believe PNA is higher likelihood given imaging findings. No leukocytosis demonstrated, though patient is on chemotherapy. Will given antibiotics but could trial lasix as well to evaluate response, though patient is tachycardic and w/ c/f sepsis will give fluids. Also consider myocardial Ischemia vs arrhythmia given report of CP, will evaluate with troponins. EKG w/ sinus tachycardia no signs of ischemia. Other - Sepsis - concern for sepsis given possible PNA, tachycardia, and tachypnea, will obtain blood cultures; Anemia, electrolyte abnormalities, renal injury  Chest CT on 12/6 demonstrated concern for pulmonary infection versus radiation change versus unlikely malignancy.  Patient is informed of this finding, which he was previously unaware of, and he states he had actually wanted to get pneumonia because he has a history of it several times and it felt similar to this.  Clinical Course as of 01/12/22 1024  Mon Jan 12, 2022  1003 Now on 2L [place] for desatting into 80s on RA [HN]  1003 WBC: 8.1 [HN]  1004 Hemoglobin: 14.0 [HN]  1004 DG Chest 2 View MPRESSION: 1. Increased bilateral interstitial and patchy left lung opacities, which could represent pulmonary edema or infection. 2. Similar appearance of perihilar consolidations, better evaluated as postradiation changes on prior chest CT.   [HN]  1004 Cefepime/azithromycin IV ordered, small fluid bolus ordered d/t LEE and crackles heard on auscultation though no pulm edema noted on CXR [HN]  1006 Consulted to medicine for admission [HN]    Clinical Course User Index [HN] Audley Hose, MD     Labs: I Ordered, and personally interpreted labs.  The pertinent results include those listed above  Imaging Studies ordered: I ordered imaging studies including CXR I independently visualized and interpreted imaging. I agree with the radiologist interpretation  Additional history obtained from chart review, wife at bedside.  Cardiac Monitoring: The patient was maintained on a cardiac monitor.  I personally viewed and interpreted the cardiac monitored which  showed an underlying rhythm of: ST  Reevaluation: After the interventions noted above, I reevaluated the patient and found that they have :stayed the same  Social Determinants of Health: Patient lives independently with his wife  Disposition:  Admit to medicine  Co morbidities that complicate the patient evaluation  Past Medical History:  Diagnosis Date   COPD (chronic obstructive pulmonary disease) ([place])    GERD (gastroesophageal reflux disease)    Headache    migraines   Hyperlipidemia    lung ca dx'd 12/2018   Pneumonia    Pulmonary embolus (H[facility])    Tobacco abuse      Medicines Meds ordered this encounter  Medications   lactated ringers infusion    I have reviewed the patients home medicines and have made adjustments as needed  Problem List / ED Course: Problem List Items  Addressed This Visit       Respiratory   Pneumonia - Primary   Relevant Medications   ceFEPIme (MAXIPIME) 2 g in sodium chloride 0.9 % 100 mL IVPB   azithromycin (ZITHROMAX) 500 mg in sodium chloride 0.9 % 250 mL IVPB               This note was created using dictation software, which may contain spelling or grammatical errors.    Audley Hose, MD 01/12/22 1027

## 2022-01-12 NOTE — ED Triage Notes (Signed)
Patient has felt short of breath for 2 weeks that keeps getting worse. He said he feels like he ran a mile, just sitting up. Has stage 3 lung cancer.

## 2022-01-12 NOTE — Progress Notes (Signed)
Elink following code sepsis °

## 2022-01-12 NOTE — H&P (Signed)
History and Physical    Patient: Philip [name] RSW:[id number] DOB: April 30, 1955 DOA: 01/12/2022 DOS: the patient was seen and examined on 01/12/2022 PCP: [name], MD  Patient coming from: Home  Chief Complaint:  Chief Complaint  Patient presents with   Shortness of Breath   HPI: [name] is a 66 y.o. male with medical history significant of adenocarcinoma of the lung, COPD, BPH, tobacco abuse, PE on xarelto. Presenting with shortness of breath. He reports that he's had about 2 weeks of dyspnea, cough and nocturnal hypoxia. His dyspnea is worse w/ exertion and requires long recovery periods. He has not noticed any peripheral swelling or discrete orthopnea. However, he does note that at night when sleeping, his oximeter gets down into the 60s and 70s. He has a productive cough. He has not had any fevers or sick contacts. He saw his pulmonologist 4 days ago and was given a steroid inhaler. However, this has note help. When his symptoms did not improve this morning, he decided to come to the ED for assistance. He denies any other aggravating or alleviating factors.  Review of Systems: As mentioned in the history of present illness. All other systems reviewed and are negative. Past Medical History:  Diagnosis Date   COPD (chronic obstructive pulmonary disease) (H[facility])    GERD (gastroesophageal reflux disease)    Headache    migraines   Hyperlipidemia    lung ca dx'd 12/2018   Pneumonia    Pulmonary embolus ([facility])    Tobacco abuse    Past Surgical History:  Procedure Laterality Date   COLONOSCOPY     CYSTOSCOPY W/ URETERAL STENT PLACEMENT Right 10/24/2021   Procedure: CYSTOSCOPY WITH RETROGRADE PYELOGRAM/URETERAL STENT PLACEMENT;  Surgeon: Janith Lima, MD;  Location: [facility];  Service: Urology;  Laterality: Right;   HAND SURGERY     VIDEO BRONCHOSCOPY WITH ENDOBRONCHIAL ULTRASOUND N/A 12/22/2018   Procedure: VIDEO BRONCHOSCOPY WITH ENDOBRONCHIAL ULTRASOUND;  Surgeon: Garner Nash, DO;  Location: [facility];  Service: Thoracic;  Laterality: N/A;   VIDEO BRONCHOSCOPY WITH ENDOBRONCHIAL ULTRASOUND N/A 01/09/2019   Procedure: VIDEO BRONCHOSCOPY WITH ENDOBRONCHIAL ULTRASOUND WITH FLUORO;  Surgeon: Garner Nash, DO;  Location: [facility];  Service: Thoracic;  Laterality: N/A;   VIDEO BRONCHOSCOPY WITH RADIAL ENDOBRONCHIAL ULTRASOUND N/A 12/22/2018   Procedure: RADIAL ENDOBRONCHIAL ULTRASOUND;  Surgeon: Garner Nash, DO;  Location: [facility];  Service: Thoracic;  Laterality: N/A;   Social History:  reports that he has been smoking cigarettes. He started smoking about 51 years ago. He has quit using smokeless tobacco.  His smokeless tobacco use included chew. He reports current alcohol use. He reports that he does not currently use drugs after having used the following drugs: Marijuana.  Allergies  Allergen Reactions   Carboplatin Anaphylaxis, Shortness Of Breath, Cough and Hypertension    On dose 8. Became short of breath. Symptom management was called. Received benadryl, solumedrol, Pepcid and fluids. Medication discontinued.    Klonopin [Clonazepam] Other (See Comments)    nervous   Norco [Hydrocodone-Acetaminophen] Other (See Comments)    Made pt feel jittery     Family History  Problem Relation Age of Onset   Cancer Father    Stomach cancer Father     Prior to Admission medications   Medication Sig Start Date End Date Taking? Authorizing Provider  acetaminophen (TYLENOL) 500 MG tablet Take 500-1,000 mg by mouth every 6 (six) hours as needed (pain.).    [provider]  albuterol (VENTOLIN HFA) 108 (90 Base) MCG/ACT inhaler Inhale 2 puffs into the lungs every 4 (four) hours as needed for wheezing or shortness of breath. 01/05/22   Hunsucker, Bonna Gains, MD  Budeson-Glycopyrrol-Formoterol (BREZTRI AEROSPHERE) 160-9-4.8 MCG/ACT AERO Inhale 2 puffs into the lungs in the morning and at bedtime. 01/05/22   Hunsucker, Bonna Gains, MD  Budeson-Glycopyrrol-Formoterol  (BREZTRI AEROSPHERE) 160-9-4.8 MCG/ACT AERO Inhale 2 puffs into the lungs in the morning and at bedtime. 01/05/22   Hunsucker, Bonna Gains, MD  dextromethorphan-guaiFENesin [facility] DM) 30-600 MG 12hr tablet Take 1 tablet by mouth 2 (two) times daily as needed for cough. 03/17/20   [name], MD  famotidine (PEPCID) 20 MG tablet TAKE 1 TABLET BY MOUTH TWICE A DAY 02/04/21   Heilingoetter, [name], [name]  folic acid (FOLVITE) 1 MG tablet TAKE 1 TABLET BY MOUTH EVERY DAY Patient taking differently: Take 1 mg by mouth every evening. 09/01/21   [name], MD  HYDROcodone-acetaminophen (NORCO) 5-325 MG tablet Take 1 tablet by mouth every 6 (six) hours as needed for moderate pain. 01/08/21   Walisiewicz, Verline Lema E, [name]  Multiple Vitamins-Minerals (MULTI ADULT GUMMIES) CHEW Chew 1 tablet by mouth in the morning and at bedtime. 09/02/21   [provider]  naphazoline-pheniramine (VISINE) 0.025-0.3 % ophthalmic solution Place 1 drop into both eyes in the morning.    [provider]  rivaroxaban (XARELTO) 20 MG TABS tablet Take 1 tablet (20 mg total) by mouth daily with supper. 03/12/21   Hunsucker, Bonna Gains, MD  tamsulosin (FLOMAX) 0.4 MG CAPS capsule Take 0.4 mg by mouth at bedtime. 10/31/21   [provider]    Physical Exam: Vitals:   01/12/22 0915 01/12/22 0930 01/12/22 0945 01/12/22 0955  BP: 108/81 (!) 119/100    Pulse: (!) 104 (!) 108    Resp: 16 (!) 23    Temp:      TempSrc:      SpO2: 90% (!) 88%  94%  Weight:   82.6 kg   Height:   5\' 6"  (1.676 m)    General: 66 y.o. male resting in bed in NAD Eyes: PERRL, normal sclera ENMT: Nares patent w/o discharge, orophaynx clear, dentition normal, ears w/o discharge/lesions/ulcers Neck: Supple, trachea midline Cardiovascular: tachy, +S1, S2, no m/g/r, equal pulses throughout Respiratory: decreased at bases, no w/r/r, slightly increased WOB on 2L [place], he is able to speak in full sentences GI: BS+, NDNT, no  masses noted, no organomegaly noted MSK: No e/c/c Neuro: A&O x 3, no focal deficits Psyc: Appropriate interaction and affect, calm/cooperative  Data Reviewed:  Results for orders placed or performed during the hospital encounter of 01/12/22 (from the past 24 hour(s))  Basic metabolic panel     Status: Abnormal   Collection Time: 01/12/22  8:40 AM  Result Value Ref Range   Sodium 137 135 - 145 mmol/L   Potassium 4.5 3.5 - 5.1 mmol/L   Chloride 103 98 - 111 mmol/L   CO2 22 22 - 32 mmol/L   Glucose, Bld 132 (H) 70 - 99 mg/dL   BUN 16 8 - 23 mg/dL   Creatinine, Ser 1.24 0.61 - 1.24 mg/dL   Calcium 9.3 8.9 - 10.3 mg/dL   GFR, Estimated >60 >60 mL/min   Anion gap 12 5 - 15  Brain natriuretic peptide     Status: Abnormal   Collection Time: 01/12/22  8:40 AM  Result Value Ref Range   B Natriuretic Peptide 104.1 (H) 0.0 - 100.0  pg/mL  CBC with Differential     Status: Abnormal   Collection Time: 01/12/22  8:40 AM  Result Value Ref Range   WBC 8.1 4.0 - 10.5 K/uL   RBC 4.48 4.22 - 5.81 MIL/uL   Hemoglobin 14.0 13.0 - 17.0 g/dL   HCT 43.5 39.0 - 52.0 %   MCV 97.1 80.0 - 100.0 fL   MCH 31.3 26.0 - 34.0 pg   MCHC 32.2 30.0 - 36.0 g/dL   RDW 14.2 11.5 - 15.5 %   Platelets 381 150 - 400 K/uL   nRBC 0.0 0.0 - 0.2 %   Neutrophils Relative % 81 %   Neutro Abs 6.5 1.7 - 7.7 K/uL   Lymphocytes Relative 7 %   Lymphs Abs 0.6 (L) 0.7 - 4.0 K/uL   Monocytes Relative 10 %   Monocytes Absolute 0.8 0.1 - 1.0 K/uL   Eosinophils Relative 0 %   Eosinophils Absolute 0.0 0.0 - 0.5 K/uL   Basophils Relative 1 %   Basophils Absolute 0.1 0.0 - 0.1 K/uL   Immature Granulocytes 1 %   Abs Immature Granulocytes 0.06 0.00 - 0.07 K/uL  Resp panel by RT-PCR (RSV, Flu A&B, Covid) Anterior Nasal Swab     Status: None   Collection Time: 01/12/22  9:04 AM   Specimen: Anterior Nasal Swab  Result Value Ref Range   SARS Coronavirus 2 by RT PCR NEGATIVE NEGATIVE   Influenza A by PCR NEGATIVE NEGATIVE    Influenza B by PCR NEGATIVE NEGATIVE   Resp Syncytial Virus by PCR NEGATIVE NEGATIVE  Blood gas, venous (at [facility] and [facility])     Status: Abnormal   Collection Time: 01/12/22  9:45 AM  Result Value Ref Range   pH, Ven 7.48 (H) 7.25 - 7.43   pCO2, Ven 37 (L) 44 - 60 mmHg   pO2, Ven 44 32 - 45 mmHg   Bicarbonate 27.6 20.0 - 28.0 mmol/L   Acid-Base Excess 4.0 (H) 0.0 - 2.0 mmol/L   O2 Saturation 72.9 %   Patient temperature 37.0   Protime-INR     Status: Abnormal   Collection Time: 01/12/22  9:45 AM  Result Value Ref Range   Prothrombin Time 22.6 (H) 11.4 - 15.2 seconds   INR 2.0 (H) 0.8 - 1.2  APTT     Status: Abnormal   Collection Time: 01/12/22  9:45 AM  Result Value Ref Range   aPTT 46 (H) 24 - 36 seconds   CXR: 1. Increased bilateral interstitial and patchy left lung opacities, which could represent pulmonary edema or infection. 2. Similar appearance of perihilar consolidations, better evaluated as postradiation changes on prior chest CT.  EKG: sinus tachy, no st elevations  Assessment and Plan: CAP Acute respiratory failure with hypoxia SIRS     - admit to inpt, tele     - continue azithro; de-escalate cefepime to rocephin @ 1800hrs     - duonebs, guaifenesin, tessalon     - COVID/flu/RSV negative     - check urine legionella/strep     - Wean O2 as able; says he was on nocturnal O2 a couple years ago, but has had to use it in quite some time  COPD     - duonebs     - resume home regimen as we de-escalate from duonebs for treatment of the above  Adenocarcinoma of the lung     - follows w/ Dr. Julien Nordmann; added to care team  GERD     -  PPI   Tobacco abuse     - counsel against further use  Hx of PE     - continue home regimen  BPH     - continue home regimen  Advance Care Planning:   Code Status: FULL  Consults: None  Family Communication: w/ wife at bedside  Severity of Illness: The appropriate patient status for this patient is INPATIENT. Inpatient status  is judged to be reasonable and necessary in order to provide the required intensity of service to ensure the patient's safety. The patient's presenting symptoms, physical exam findings, and initial radiographic and laboratory data in the context of their chronic comorbidities is felt to place them at high risk for further clinical deterioration. Furthermore, it is not anticipated that the patient will be medically stable for discharge from the hospital within 2 midnights of admission.   * I certify that at the point of admission it is my clinical judgment that the patient will require inpatient hospital care spanning beyond 2 midnights from the point of admission due to high intensity of service, high risk for further deterioration and high frequency of surveillance required.*  Author: Jonnie Finner, DO 01/12/2022 10:16 AM  For on call review [email or web address].

## 2022-01-13 ENCOUNTER — Encounter: Payer: Self-pay | Admitting: Internal Medicine

## 2022-01-13 ENCOUNTER — Other Ambulatory Visit: Payer: Self-pay

## 2022-01-13 ENCOUNTER — Encounter: Payer: Self-pay | Admitting: Physician Assistant

## 2022-01-13 DIAGNOSIS — C3491 Malignant neoplasm of unspecified part of right bronchus or lung: Secondary | ICD-10-CM

## 2022-01-13 DIAGNOSIS — J189 Pneumonia, unspecified organism: Secondary | ICD-10-CM | POA: Diagnosis not present

## 2022-01-13 LAB — CBC
HCT: 36 % — ABNORMAL LOW (ref 39.0–52.0)
Hemoglobin: 11.3 g/dL — ABNORMAL LOW (ref 13.0–17.0)
MCH: 30.8 pg (ref 26.0–34.0)
MCHC: 31.4 g/dL (ref 30.0–36.0)
MCV: 98.1 fL (ref 80.0–100.0)
Platelets: 312 10*3/uL (ref 150–400)
RBC: 3.67 MIL/uL — ABNORMAL LOW (ref 4.22–5.81)
RDW: 14.2 % (ref 11.5–15.5)
WBC: 6.9 10*3/uL (ref 4.0–10.5)
nRBC: 0 % (ref 0.0–0.2)

## 2022-01-13 LAB — COMPREHENSIVE METABOLIC PANEL
ALT: 18 U/L (ref 0–44)
AST: 23 U/L (ref 15–41)
Albumin: 2.7 g/dL — ABNORMAL LOW (ref 3.5–5.0)
Alkaline Phosphatase: 84 U/L (ref 38–126)
Anion gap: 9 (ref 5–15)
BUN: 17 mg/dL (ref 8–23)
CO2: 23 mmol/L (ref 22–32)
Calcium: 8.4 mg/dL — ABNORMAL LOW (ref 8.9–10.3)
Chloride: 105 mmol/L (ref 98–111)
Creatinine, Ser: 1.17 mg/dL (ref 0.61–1.24)
GFR, Estimated: >60 mL/min (ref >60)
Glucose, Bld: 102 mg/dL — ABNORMAL HIGH (ref 70–99)
Potassium: 4.2 mmol/L (ref 3.5–5.1)
Sodium: 137 mmol/L (ref 135–145)
Total Bilirubin: 0.4 mg/dL (ref 0.3–1.2)
Total Protein: 6.6 g/dL (ref 6.5–8.1)

## 2022-01-13 LAB — HIV ANTIBODY (ROUTINE TESTING W REFLEX): HIV Screen 4th Generation wRfx: NONREACTIVE

## 2022-01-13 NOTE — Progress Notes (Signed)
PROGRESS NOTE    [name]  KVQ:[id number] DOB: 1955-04-26 DOA: 01/12/2022 PCP: [name], MD   Brief Narrative:  Philip [name] is a 66 y.o. male with medical history significant of adenocarcinoma of the lung, COPD, BPH, tobacco abuse, PE on xarelto. Presenting with shortness of breath. He reports that he's had about 2 weeks of dyspnea, cough and nocturnal hypoxia. His dyspnea is worse w/ exertion and requires long recovery periods.   Assessment & Plan:   Principal Problem:   CAP (community acquired pneumonia) Active Problems:   Adenocarcinoma of right lung, stage 3 (HCC)   Respiratory failure, acute (HCC)   Pulmonary embolism (HCC)   COPD, moderate (HCC)   Tobacco abuse   BPH (benign prostatic hyperplasia)   GERD (gastroesophageal reflux disease)   SIRS (systemic inflammatory response syndrome) (HCC)  SEPSIS secondary to suspected community acquired pneumonia, POA Acute respiratory failure with hypoxia - Continue azithro; de-escalate cefepime to rocephin @ 1800hrs - Duonebs, guaifenesin, tessalon - COVID/flu/RSV negative - Wean O2 as able; says he was on nocturnal O2 a couple years ago, but has had to use it in quite some time   COPD, questionable exacerbation secondary to above, POA - Duonebs - Resume home regimen as we de-escalate from duonebs for treatment of the above   Adenocarcinoma of the lung - Follows w/ Dr. Julien Nordmann; added to care team - Likely complicating above   GERD - PPI    Tobacco abuse - Counsel against further use   Hx of PE -Continue xarelto   BPH - Continue home regimen  DVT prophylaxis: Xarelto Code Status: Full Family Communication: Daughter present  Status is: Inpt  Dispo: The patient is from: Home              Anticipated d/c is to: Home              Anticipated d/c date is: 24-48h              Patient currently NOT medically stable for discharge  Consultants:  None  Procedures:  None  Antimicrobials:   Azithromycin and Ceftriaxone   Subjective: No acute issues/events overnight - respiratory status improving  Objective: Vitals:   01/12/22 1533 01/12/22 1939 01/12/22 2303 01/13/22 0253  BP: 110/79 91/67 105/77 93/68  Pulse: 85 100 98 96  Resp: 16 17 18 18   Temp: 98.5 F (36.9 C) (!) 97.4 F (36.3 C) 98 F (36.7 C) (!) 97.5 F (36.4 C)  TempSrc: Oral Oral Oral Oral  SpO2: 92% 93% 95% 91%  Weight:      Height:       No intake or output data in the 24 hours ending 01/13/22 0749 Filed Weights   01/12/22 0945  Weight: 82.6 kg    Examination:  General exam: Appears calm and comfortable  Respiratory system: Clear to auscultation. Respiratory effort normal. Cardiovascular system: S1 & S2 heard, RRR. No JVD, murmurs, rubs, gallops or clicks. No pedal edema. Gastrointestinal system: Abdomen is nondistended, soft and nontender. No organomegaly or masses felt. Normal bowel sounds heard. Central nervous system: Alert and oriented. No focal neurological deficits. Extremities: Symmetric 5 x 5 power. Skin: No rashes, lesions or ulcers Psychiatry: Judgement and insight appear normal. Mood & affect appropriate.     Data Reviewed: I have personally reviewed following labs and imaging studies  CBC: Recent Labs  Lab 01/12/22 0840 01/13/22 0522  WBC 8.1 6.9  NEUTROABS 6.5  --   HGB 14.0 11.3*  HCT 43.5 36.0*  MCV 97.1 98.1  PLT 381 332   Basic Metabolic Panel: Recent Labs  Lab 01/12/22 0840  NA 137  K 4.5  CL 103  CO2 22  GLUCOSE 132*  BUN 16  CREATININE 1.24  CALCIUM 9.3   GFR: Estimated Creatinine Clearance: 59.1 mL/min (by C-G formula based on SCr of 1.24 mg/dL). Liver Function Tests: No results for input(s): "AST", "ALT", "ALKPHOS", "BILITOT", "PROT", "ALBUMIN" in the last 168 hours. No results for input(s): "LIPASE", "AMYLASE" in the last 168 hours. No results for input(s): "AMMONIA" in the last 168 hours. Coagulation Profile: Recent Labs  Lab  01/12/22 0945  INR 2.0*   Cardiac Enzymes: No results for input(s): "CKTOTAL", "CKMB", "CKMBINDEX", "TROPONINI" in the last 168 hours. BNP (last 3 results) No results for input(s): "PROBNP" in the last 8760 hours. HbA1C: No results for input(s): "HGBA1C" in the last 72 hours. CBG: No results for input(s): "GLUCAP" in the last 168 hours. Lipid Profile: No results for input(s): "CHOL", "HDL", "LDLCALC", "TRIG", "CHOLHDL", "LDLDIRECT" in the last 72 hours. Thyroid Function Tests: No results for input(s): "TSH", "T4TOTAL", "FREET4", "T3FREE", "THYROIDAB" in the last 72 hours. Anemia Panel: No results for input(s): "VITAMINB12", "FOLATE", "FERRITIN", "TIBC", "IRON", "RETICCTPCT" in the last 72 hours. Sepsis Labs: Recent Labs  Lab 01/12/22 0945 01/12/22 1200 01/12/22 1824  LATICACIDVEN 1.7 2.4* 2.2*    Recent Results (from the past 240 hour(s))  Resp panel by RT-PCR (RSV, Flu A&B, Covid) Anterior Nasal Swab     Status: None   Collection Time: 01/12/22  9:04 AM   Specimen: Anterior Nasal Swab  Result Value Ref Range Status   SARS Coronavirus 2 by RT PCR NEGATIVE NEGATIVE Final    Comment: (NOTE) SARS-CoV-2 target nucleic acids are NOT DETECTED.  The SARS-CoV-2 RNA is generally detectable in upper respiratory specimens during the acute phase of infection. The lowest concentration of SARS-CoV-2 viral copies this assay can detect is 138 copies/mL. A negative result does not preclude SARS-Cov-2 infection and should not be used as the sole basis for treatment or other patient management decisions. A negative result may occur with  improper specimen collection/handling, submission of specimen other than nasopharyngeal swab, presence of viral mutation(s) within the areas targeted by this assay, and inadequate number of viral copies(<138 copies/mL). A negative result must be combined with clinical observations, patient history, and epidemiological information. The expected result is  Negative.  Fact Sheet for Patients:  EntrepreneurPulse.com.au  Fact Sheet for Healthcare Providers:  IncredibleEmployment.be  This test is no t yet approved or cleared by the [place] FDA and  has been authorized for detection and/or diagnosis of SARS-CoV-2 by FDA under an Emergency Use Authorization (EUA). This EUA will remain  in effect (meaning this test can be used) for the duration of the COVID-19 declaration under Section 564(b)(1) of the Act, 21 U.S.C.section 360bbb-3(b)(1), unless the authorization is terminated  or revoked sooner.       Influenza A by PCR NEGATIVE NEGATIVE Final   Influenza B by PCR NEGATIVE NEGATIVE Final    Comment: (NOTE) The Xpert Xpress SARS-CoV-2/FLU/RSV plus assay is intended as an aid in the diagnosis of influenza from Nasopharyngeal swab specimens and should not be used as a sole basis for treatment. Nasal washings and aspirates are unacceptable for Xpert Xpress SARS-CoV-2/FLU/RSV testing.  Fact Sheet for Patients: EntrepreneurPulse.com.au  Fact Sheet for Healthcare Providers: IncredibleEmployment.be  This test is not yet approved or cleared by the [place] and  has been authorized for detection and/or diagnosis of SARS-CoV-2 by FDA under an Emergency Use Authorization (EUA). This EUA will remain in effect (meaning this test can be used) for the duration of the COVID-19 declaration under Section 564(b)(1) of the Act, 21 U.S.C. section 360bbb-3(b)(1), unless the authorization is terminated or revoked.     Resp Syncytial Virus by PCR NEGATIVE NEGATIVE Final    Comment: (NOTE) Fact Sheet for Patients: EntrepreneurPulse.com.au  Fact Sheet for Healthcare Providers: IncredibleEmployment.be  This test is not yet approved or cleared by the [place] FDA and has been authorized for detection and/or diagnosis of  SARS-CoV-2 by FDA under an Emergency Use Authorization (EUA). This EUA will remain in effect (meaning this test can be used) for the duration of the COVID-19 declaration under Section 564(b)(1) of the Act, 21 U.S.C. section 360bbb-3(b)(1), unless the authorization is terminated or revoked.  Performed at [facility], [place] [address], [place], [place] [address]   Blood Culture (routine x 2)     Status: None (Preliminary result)   Collection Time: 01/12/22  9:45 AM   Specimen: Left Antecubital; Blood  Result Value Ref Range Status   Specimen Description   Final    LEFT ANTECUBITAL BLOOD Performed at [facility], [place] [address], [place], [place] [address]    Special Requests   Final    BOTTLES DRAWN AEROBIC AND ANAEROBIC Blood Culture adequate volume Performed at [place] [address], [place], [place] [address]    Culture PENDING  Incomplete   Report Status PENDING  Incomplete  Blood Culture (routine x 2)     Status: None (Preliminary result)   Collection Time: 01/12/22  9:48 AM   Specimen: Right Antecubital; Blood  Result Value Ref Range Status   Specimen Description   Final    RIGHT ANTECUBITAL BLOOD Performed at [facility], [place] [address], [place], [place] [address]    Special Requests   Final    BOTTLES DRAWN AEROBIC AND ANAEROBIC Blood Culture adequate volume Performed at [place] [address] [place], [place] [address]    Culture PENDING  Incomplete   Report Status PENDING  Incomplete   Radiology Studies: DG Chest 2 View  Result Date: 01/12/2022 CLINICAL DATA:  Patient with history of lung cancer with Two-week history of shortness of breath EXAM: CHEST - 2 VIEW COMPARISON:  Chest radiograph dated 10/23/2021, CT chest dated 01/07/2022 FINDINGS: Low lung volumes with similar asymmetric elevation of the right hemidiaphragm. Similar appearance of perihilar consolidations. Increased  bilateral interstitial and patchy left lung opacities. No pleural effusion or pneumothorax. The heart size and mediastinal contours are within normal limits. The visualized skeletal structures are unremarkable. Partially imaged catheter projects over the right lower hemiabdomen. IMPRESSION: 1. Increased bilateral interstitial and patchy left lung opacities, which could represent pulmonary edema or infection. 2. Similar appearance of perihilar consolidations, better evaluated as postradiation changes on prior chest CT. Electronically Signed   By: Darrin Nipper M.D.   On: 01/12/2022 08:49    Scheduled Meds:  famotidine  20 mg Oral BID   folic acid  1 mg Oral QPM   guaiFENesin  1,200 mg Oral BID   ipratropium-albuterol  3 mL Nebulization Q6H   rivaroxaban  20 mg Oral Q supper   tamsulosin  0.4 mg Oral QHS   Continuous Infusions:  azithromycin     cefTRIAXone (ROCEPHIN)  IV 2 g (01/12/22 2159)  LOS: 1 day   Time spent: 51min  Nickie Deren C Jimi Giza, DO [facility]  If 7PM-7AM, please contact night-coverage [email or web address]  01/13/2022, 7:49 AM

## 2022-01-13 NOTE — Progress Notes (Signed)
DIAGNOSIS:  1) recurrent non-small cell lung cancer initially diagnosed as stage IIIc (T4, N3, M0) non-small cell lung cancer, adenocarcinoma presented with large right hilar mass in addition to bilateral hilar and mediastinal lymphadenopathy diagnosed in December 2020.  The patient has evidence for disease recurrence in October 2022. 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  Started on Xarelto on March 17, 2020 and currently 20 mg p.o. daily.   PRIOR THERAPY:  1) Weekly concurrent chemoradiation with carboplatin for an AUC of 2 and paclitaxel 45 mg/m2. First dose starting 01/30/2019.   Status post 6 cycles. 2) Consolidation immunotherapy with Imfinzi 1500 mg IV every 4 weeks. First dose expected on 04/19/2019.  Status post 13 cycles. 3) disease recurrence in October 2022.   CURRENT THERAPY: Systemic chemotherapy with carboplatin for AUC of 5, Alimta 500 Mg/M2 and Keytruda 200 Mg IV every 3 weeks.  First dose January 01, 2021.  Status post 18 cycles.  He had hypersensitivity reaction to carboplatin and this was discontinued after cycle #2.  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  Subjective: The patient is seen and examined today.  He is a very pleasant 66 years old white male diagnosed with metastatic non-small cell lung cancer that was initially diagnosed as a stage III 3A non-small cell lung cancer in December 2020 with disease recurrence in October 2022.  Patient is currently undergoing maintenance treatment with single agent Alimta and has been tolerating this treatment fairly well.  He was admitted to the hospital with worsening dyspnea.  He had repeat CT scan of the chest, abdomen and pelvis performed recently and that showed stable disease but there was new interstitial thickening and ill-defined groundglass densities/nodules in the  lingula and lower lobe concerning for pulmonary infection.  He is currently undergoing treatment with a course of antibiotics with azithromycin and Rocephin and feeling much better.   Objective: Vital signs in last 24 hours: Temp:  [97.5 F (36.4 C)-98.1 F (36.7 C)] 97.9 F (36.6 C) (12/12 2058) Pulse Rate:  [96-106] 106 (12/12 2058) Resp:  [17-19] 17 (12/12 2058) BP: (87-105)/(62-77) 87/62 (12/12 2058) SpO2:  [91 %-96 %] 96 % (12/12 2058)  Intake/Output from previous day: No intake/output data recorded. Intake/Output this shift: No intake/output data recorded.  General appearance: alert, cooperative, fatigued, and no distress Resp: rales LLL Cardio: regular rate and rhythm, S1, S2 normal, no murmur, click, rub or gallop GI: soft, non-tender; bowel sounds normal; no masses,  no organomegaly Extremities: extremities normal, atraumatic, no cyanosis or edema  Lab Results:  Recent Labs    01/12/22 0840 01/13/22 0522  WBC 8.1 6.9  HGB 14.0 11.3*  HCT 43.5 36.0*  PLT 381 312   BMET Recent Labs    01/12/22 0840 01/13/22 0522  NA 137 137  K 4.5 4.2  CL 103 105  CO2 22 23  GLUCOSE 132* 102*  BUN 16 17  CREATININE 1.24 1.17  CALCIUM 9.3 8.4*    Studies/Results: DG Chest 2 View  Result Date: 01/12/2022 CLINICAL DATA:  Patient with history of lung cancer with Two-week history of shortness of breath EXAM: CHEST - 2 VIEW COMPARISON:  Chest radiograph dated 10/23/2021, CT chest dated 01/07/2022 FINDINGS: Low lung volumes with similar asymmetric elevation of the right hemidiaphragm. Similar appearance of perihilar consolidations. Increased bilateral interstitial and patchy left lung opacities. No pleural effusion  or pneumothorax. The heart size and mediastinal contours are within normal limits. The visualized skeletal structures are unremarkable. Partially imaged catheter projects over the right lower hemiabdomen. IMPRESSION: 1. Increased bilateral interstitial and patchy left  lung opacities, which could represent pulmonary edema or infection. 2. Similar appearance of perihilar consolidations, better evaluated as postradiation changes on prior chest CT. Electronically Signed   By: Darrin Nipper M.D.   On: 01/12/2022 08:49    Medications: I have reviewed the patient's current medications.   Assessment/Plan: This is a very pleasant 66 years old white male with metastatic non-small cell lung cancer and currently on maintenance treatment with Alimta every 3 weeks and has been tolerating this treatment fairly well.  The patient was admitted to the hospital with pneumonia and he is currently undergoing treatment with this Romycin and Rocephin. The patient is feeling much better. I will delay the start of cycle by 2 weeks until after Christmas to give him time to recover recent infection. Thank you for taking good care of Mr. Benish.  Please call if you have any questions.    LOS: 1 day    Eilleen Kempf 01/13/2022

## 2022-01-13 NOTE — Progress Notes (Signed)
Mobility Specialist - Progress Note   01/13/22 1100  Oxygen Therapy  SpO2 92 %  O2 Device Nasal Cannula  O2 Flow Rate (L/min) 4 L/min  Mobility  Activity Ambulated with assistance in hallway  Level of Assistance Modified independent, requires aide device or extra time  Assistive Device None  Distance Ambulated (ft) 200 ft  Activity Response Tolerated well  Mobility Referral Yes  $Mobility charge 1 Mobility   Nurse requested Mobility Specialist to perform oxygen saturation test with pt which includes removing pt from oxygen both at rest and while ambulating.  Below are the results from that testing.     Patient Saturations on Room Air at Rest = spO2 91%  Patient Saturations on Room Air while Ambulating = sp02 82% .  Rested and performed pursed lip breathing for 1 minute with sp02 at 84%.  Patient Saturations on 4 Liters of oxygen while Ambulating = sp02 90%  At end of testing pt left in room on 4 Liters of oxygen.  Reported results to nurse.   Pt received in bed and agreed to walking O2 test. Pt felt SOB during session but no dizziness nor pain. Pt returned to bed where their O2 slowly returned to 92%.    Roderick Pee Mobility Specialist

## 2022-01-14 ENCOUNTER — Other Ambulatory Visit: Payer: Self-pay

## 2022-01-14 ENCOUNTER — Inpatient Hospital Stay: Payer: No Typology Code available for payment source | Admitting: Internal Medicine

## 2022-01-14 ENCOUNTER — Inpatient Hospital Stay: Payer: No Typology Code available for payment source

## 2022-01-14 DIAGNOSIS — J9601 Acute respiratory failure with hypoxia: Secondary | ICD-10-CM | POA: Diagnosis not present

## 2022-01-14 DIAGNOSIS — C3491 Malignant neoplasm of unspecified part of right bronchus or lung: Secondary | ICD-10-CM | POA: Diagnosis not present

## 2022-01-14 DIAGNOSIS — K219 Gastro-esophageal reflux disease without esophagitis: Secondary | ICD-10-CM

## 2022-01-14 DIAGNOSIS — J189 Pneumonia, unspecified organism: Secondary | ICD-10-CM | POA: Diagnosis not present

## 2022-01-14 DIAGNOSIS — I2690 Septic pulmonary embolism without acute cor pulmonale: Secondary | ICD-10-CM

## 2022-01-14 DIAGNOSIS — J449 Chronic obstructive pulmonary disease, unspecified: Secondary | ICD-10-CM

## 2022-01-14 DIAGNOSIS — Z720 Tobacco use: Secondary | ICD-10-CM

## 2022-01-14 MED ORDER — FLUTICASONE PROPIONATE 50 MCG/ACT NA SUSP
1.0000 | Freq: Every day | NASAL | Status: DC
  Administered 2022-01-14 – 2022-01-22 (×9): 1 via NASAL
  Filled 2022-01-14 (×2): qty 16

## 2022-01-14 MED ORDER — LORATADINE 10 MG PO TABS
10.0000 mg | ORAL_TABLET | Freq: Every day | ORAL | Status: DC
  Administered 2022-01-14 – 2022-01-22 (×9): 10 mg via ORAL
  Filled 2022-01-14 (×9): qty 1

## 2022-01-14 NOTE — Progress Notes (Signed)
SATURATION QUALIFICATIONS: (This note is used to comply with regulatory documentation for home oxygen)  Patient Saturations on Room Air at Rest = 81%

## 2022-01-14 NOTE — Progress Notes (Signed)
[facility]                                                                               Philip [name], is a 66 y.o. male, DOB - September 29, 1955, ZOX:[id number] Admit date - 01/12/2022    Outpatient Primary MD for the patient is [name], MD  LOS - 2  days    Brief summary    Philip [name] is a 66 y.o. male with medical history significant of adenocarcinoma of the lung, COPD, BPH, tobacco abuse, PE on xarelto. Presenting with shortness of breath.    Assessment & Plan    Assessment and Plan:   Sepsis secondary to CAP present on admission.  Acute respiratory failure with hypoxia Continue with antibiotics to complete the course.  Currently requiring upto 5 lit of [place] oxygen to keep sats greater than 905.  COVID / flu/ RVP negative.     COPD exacerbation:  Continue with duonebs.     Adeno ca of the lungs:  Follows up with Dr Julien Nordmann.    Pulmonary embolism:  Continue with Xarelto.    GERD;  Continue with PPI.    Tobacco abuse ; Counseled against further use.    Estimated body mass index is 29.38 kg/m as calculated from the following:   Height as of this encounter: 5\' 6"  (1.676 m).   Weight as of this encounter: 82.6 kg.  Code Status: full code.  DVT Prophylaxis:   rivaroxaban (XARELTO) tablet 20 mg   Level of Care: Level of care: Telemetry Family Communication: none at bedside.   Disposition Plan:     Remains inpatient appropriate: IV antibiotics.   Procedures:  None.  Consultants:   Oncology.   Antimicrobials:   Anti-infectives (From admission, onward)    Start     Dose/Rate Route Frequency Ordered Stop   01/13/22 1000  azithromycin (ZITHROMAX) 500 mg in sodium chloride 0.9 % 250 mL IVPB        500 mg 250 mL/hr over 60 Minutes Intravenous Every 24 hours 01/12/22 1532 01/17/22 0959   01/12/22 2200  cefTRIAXone (ROCEPHIN) 2 g in sodium chloride 0.9 % 100 mL IVPB        2 g 200 mL/hr over 30 Minutes Intravenous Every 24  hours 01/12/22 1532 01/17/22 2159   01/12/22 1015  ceFEPIme (MAXIPIME) 2 g in sodium chloride 0.9 % 100 mL IVPB        2 g 200 mL/hr over 30 Minutes Intravenous  Once 01/12/22 1004 01/12/22 1109   01/12/22 1015  azithromycin (ZITHROMAX) 500 mg in sodium chloride 0.9 % 250 mL IVPB        500 mg 250 mL/hr over 60 Minutes Intravenous  Once 01/12/22 1004 01/12/22 1241        Medications  Scheduled Meds:  famotidine  20 mg Oral BID   fluticasone  1 spray Each Nare Daily   folic acid  1 mg Oral QPM   guaiFENesin  1,200 mg Oral BID   ipratropium-albuterol  3 mL Nebulization Q6H   loratadine  10 mg Oral Daily   rivaroxaban  20 mg Oral Q supper   tamsulosin  0.4 mg  Oral QHS   Continuous Infusions:  azithromycin 500 mg (01/14/22 0853)   cefTRIAXone (ROCEPHIN)  IV Stopped (01/13/22 2200)   PRN Meds:.acetaminophen **OR** acetaminophen, benzonatate, naphazoline-pheniramine, prochlorperazine    Subjective:   Philip [name] was seen and examined today.  Breathing is the same as yesterday. Some coughing, congested.  Objective:   Vitals:   01/14/22 0539 01/14/22 0837 01/14/22 1045 01/14/22 1335  BP: 93/64   107/74  Pulse: 92   95  Resp: 18   (!) 22  Temp: 98.1 F (36.7 C)   98.3 F (36.8 C)  TempSrc: Oral     SpO2: 94% 91% 92% 95%  Weight:      Height:        Intake/Output Summary (Last 24 hours) at 01/14/2022 1342 Last data filed at 01/14/2022 1318 Gross per 24 hour  Intake 473 ml  Output --  Net 473 ml   Filed Weights   01/12/22 0945  Weight: 82.6 kg     Exam General exam: Appears calm and comfortable  Respiratory system: Clear to auscultation. Respiratory effort normal. Cardiovascular system: S1 & S2 heard, RRR. No JVD,  Gastrointestinal system: Abdomen is nondistended, soft and nontender.  Central nervous system: Alert and oriented. No focal neurological deficits. Extremities: Symmetric 5 x 5 power. Skin: No rashes, lesions or ulcers Psychiatry:  Mood &  affect appropriate.     Data Reviewed:  I have personally reviewed following labs and imaging studies   CBC Lab Results  Component Value Date   WBC 6.9 01/13/2022   RBC 3.67 (L) 01/13/2022   HGB 11.3 (L) 01/13/2022   HCT 36.0 (L) 01/13/2022   MCV 98.1 01/13/2022   MCH 30.8 01/13/2022   PLT 312 01/13/2022   MCHC 31.4 01/13/2022   RDW 14.2 01/13/2022   LYMPHSABS 0.6 (L) 01/12/2022   MONOABS 0.8 01/12/2022   EOSABS 0.0 01/12/2022   BASOSABS 0.1 46/28/6381     Last metabolic panel Lab Results  Component Value Date   NA 137 01/13/2022   K 4.2 01/13/2022   CL 105 01/13/2022   CO2 23 01/13/2022   BUN 17 01/13/2022   CREATININE 1.17 01/13/2022   GLUCOSE 102 (H) 01/13/2022   GFRNONAA >60 01/13/2022   GFRAA >60 11/02/2019   CALCIUM 8.4 (L) 01/13/2022   PROT 6.6 01/13/2022   ALBUMIN 2.7 (L) 01/13/2022   BILITOT 0.4 01/13/2022   ALKPHOS 84 01/13/2022   AST 23 01/13/2022   ALT 18 01/13/2022   ANIONGAP 9 01/13/2022    CBG (last 3)  No results for input(s): "GLUCAP" in the last 72 hours.    Coagulation Profile: Recent Labs  Lab 01/12/22 0945  INR 2.0*     Radiology Studies: No results found.     Hosie Poisson M.D. [facility] 01/14/2022, 1:42 PM  Available via Epic secure chat 7am-7pm After 7 pm, please refer to night coverage provider listed on amion.

## 2022-01-14 NOTE — Progress Notes (Signed)
Mobility Specialist - Progress Note   01/14/22 1045  Oxygen Therapy  SpO2 92 %  O2 Device Nasal Cannula  O2 Flow Rate (L/min) 6 L/min  Mobility  Activity Ambulated with assistance to bathroom  Level of Assistance Standby assist, set-up cues, supervision of patient - no hands on  Assistive Device None  Distance Ambulated (ft) 25 ft  Activity Response Tolerated well  Mobility Referral Yes  $Mobility charge 1 Mobility   Nurse requested Mobility Specialist to perform oxygen saturation test with pt which includes removing pt from oxygen both at rest and while ambulating.  Below are the results from that testing.     Patient Saturations on 4L at Rest = spO2 88% (during bed mobility)  Patient Saturations on 4 Liters of oxygen while Ambulating = sp02 84%  At end of testing pt left in room on 6 Liters of oxygen per nurse request.   Reported results to nurse.   Pt received in bed and agreed to mobility, after sitting EOB SpO2 dropped to 88%. Notified RN and was told to move it to 5L. SpO2 returned to 92%. Pt needed to use restroom prior to walking in hall. After restroom pt returned to bed where SpO2 dropped again to 84%. Notified RN who advised to move to 6L and wait to continue test later today. Pt back in bed on 6L with all needs met.   Roderick Pee Mobility Specialist

## 2022-01-14 NOTE — Progress Notes (Signed)
Patient is has some nasal stuffiness, requesting Flonase from the provider, also he is now requiring 6L at rest to achieve 93% saturation. The stuffiness may be contributing due to the fact he isnt really able to breathe through his nose. Md made aware.

## 2022-01-14 NOTE — TOC Transition Note (Signed)
Transition of Care [facility]) - CM/SW Discharge Note   Patient Details  Name: JAIMEN MELONE MRN: [id number] Date of Birth: 01/30/1956  Transition of Care [facility]) CM/SW Contact:  Vassie Moselle, LCSW Phone Number: 01/14/2022, 12:05 PM   Clinical Narrative:    Met with pt and confirmed need for home O2. Pt shares he has had O2 in the past through [facility] and would like to use this agency again. O2 has been ordered through [facility] and will be delivered to pt's room prior to discharge.    Final next level of care: Home/Self Care Barriers to Discharge: No Barriers Identified   Patient Goals and CMS Choice Patient states their goals for this hospitalization and ongoing recovery are:: To go home CMS Medicare.gov Compare Post Acute Care list provided to:: Patient Choice offered to / list presented to : Patient  Discharge Placement                       Discharge Plan and Services                DME Arranged: Oxygen DME Agency: [facility] Date DME Agency Contacted: 01/14/22 Time DME Agency Contacted: 1204 Representative spoke with at DME Agency: [place] Determinants of Health ([place]) Interventions Housing Interventions: Intervention Not Indicated   Readmission Risk Interventions    01/14/2022   12:03 PM  Readmission Risk Prevention Plan  Transportation Screening Complete  PCP or Specialist Appt within 5-7 Days Complete  Home Care Screening Complete  Medication Review (RN CM) Complete

## 2022-01-15 LAB — BASIC METABOLIC PANEL
Anion gap: 6 (ref 5–15)
BUN: 15 mg/dL (ref 8–23)
CO2: 28 mmol/L (ref 22–32)
Calcium: 8.4 mg/dL — ABNORMAL LOW (ref 8.9–10.3)
Chloride: 103 mmol/L (ref 98–111)
Creatinine, Ser: 0.98 mg/dL (ref 0.61–1.24)
GFR, Estimated: >60 mL/min (ref >60)
Glucose, Bld: 94 mg/dL (ref 70–99)
Potassium: 4.2 mmol/L (ref 3.5–5.1)
Sodium: 137 mmol/L (ref 135–145)

## 2022-01-15 MED ORDER — FUROSEMIDE 10 MG/ML IJ SOLN: 20.0000 mg | Freq: Once | INTRAMUSCULAR | Status: DC

## 2022-01-15 MED ORDER — AZITHROMYCIN 250 MG PO TABS
500.0000 mg | ORAL_TABLET | Freq: Once | ORAL | Status: AC
  Administered 2022-01-16: 500 mg via ORAL
  Filled 2022-01-15: qty 2

## 2022-01-15 NOTE — Progress Notes (Signed)
[facility]                                                                               Philip [name], is a 66 y.o. male, DOB - 09-22-1955, ZOX:[id number] Admit date - 01/12/2022    Outpatient Primary MD for the patient is Philip Pepper, MD  LOS - 3  days    Brief summary    Philip [name] is a 66 y.o. male with medical history significant of adenocarcinoma of the lung, COPD, BPH, tobacco abuse, PE on xarelto. Presenting with shortness of breath. He was admitted for sepsis from CAP   Assessment & Plan    Assessment and Plan:   Sepsis secondary to CAP present on admission.  Acute respiratory failure with hypoxia Continue with antibiotics to complete the course.  Currently requiring upto 5 lit of [place] oxygen to keep sats greater than 90%. Patient reports that he feels bad Today, not ready for discharge.  COVID / flu/ RVP negative.  Sepsis physiology resolved.  Continue with duo nebs and guaifenesin and Tessalon.  Repeat CXR in am for further evaluation.     COPD exacerbation:  Scattered wheezing.  Continue with duonebs. Remains on 5 lit/min.     Adeno ca of the lungs:  Follows up with Dr Philip [name].    Pulmonary embolism:  Continue with Xarelto.    GERD;  Continue with PPI.    Tobacco abuse ; Counseled against further use.    Estimated body mass index is 29.38 kg/m as calculated from the following:   Height as of this encounter: 5\' 6"  (1.676 m).   Weight as of this encounter: 82.6 kg.  Code Status: full code.  DVT Prophylaxis:   rivaroxaban (XARELTO) tablet 20 mg   Level of Care: Level of care: Telemetry Family Communication: none at bedside.   Disposition Plan:     Remains inpatient appropriate: IV antibiotics.   Procedures:  None.  Consultants:   Oncology.   Antimicrobials:   Anti-infectives (From admission, onward)    Start     Dose/Rate Route Frequency Ordered Stop   01/16/22 1000  azithromycin (ZITHROMAX) tablet 500 mg         500 mg Oral  Once 01/15/22 1108     01/13/22 1000  azithromycin (ZITHROMAX) 500 mg in sodium chloride 0.9 % 250 mL IVPB  Status:  Discontinued        500 mg 250 mL/hr over 60 Minutes Intravenous Every 24 hours 01/12/22 1532 01/15/22 1108   01/12/22 2200  cefTRIAXone (ROCEPHIN) 2 g in sodium chloride 0.9 % 100 mL IVPB        2 g 200 mL/hr over 30 Minutes Intravenous Every 24 hours 01/12/22 1532 01/17/22 2159   01/12/22 1015  ceFEPIme (MAXIPIME) 2 g in sodium chloride 0.9 % 100 mL IVPB        2 g 200 mL/hr over 30 Minutes Intravenous  Once 01/12/22 1004 01/12/22 1109   01/12/22 1015  azithromycin (ZITHROMAX) 500 mg in sodium chloride 0.9 % 250 mL IVPB        500 mg 250 mL/hr over 60 Minutes Intravenous  Once 01/12/22 1004 01/12/22 1241  Medications  Scheduled Meds:  [START ON 01/16/2022] azithromycin  500 mg Oral Once   famotidine  20 mg Oral BID   fluticasone  1 spray Each Nare Daily   folic acid  1 mg Oral QPM   guaiFENesin  1,200 mg Oral BID   ipratropium-albuterol  3 mL Nebulization Q6H   loratadine  10 mg Oral Daily   rivaroxaban  20 mg Oral Q supper   tamsulosin  0.4 mg Oral QHS   Continuous Infusions:  cefTRIAXone (ROCEPHIN)  IV 2 g (01/14/22 2150)   PRN Meds:.acetaminophen **OR** acetaminophen, benzonatate, naphazoline-pheniramine, prochlorperazine    Subjective:   Philip [name] was seen and examined today. Reports feeling bad, not back to baseline, still on 5 lit of [place] oxygen.  Objective:   Vitals:   01/14/22 2132 01/15/22 0201 01/15/22 0451 01/15/22 0930  BP: 112/74  103/67   Pulse: (!) 104  100   Resp: 20  16   Temp: 98.2 F (36.8 C)  (!) 97.3 F (36.3 C)   TempSrc: Oral  Oral   SpO2: 100% 92% 96% 96%  Weight:      Height:        Intake/Output Summary (Last 24 hours) at 01/15/2022 1247 Last data filed at 01/14/2022 2154 Gross per 24 hour  Intake 1101.45 ml  Output 550 ml  Net 551.45 ml    Filed Weights   01/12/22 0945  Weight:  82.6 kg     Exam General exam: Appears calm and comfortable  Respiratory system: Clear to auscultation. Respiratory effort normal. Cardiovascular system: S1 & S2 heard, RRR. No JVD, No pedal edema. Gastrointestinal system: Abdomen is nondistended, soft and nontender.Normal bowel sounds heard. Central nervous system: Alert and oriented. No focal neurological deficits. Extremities: Symmetric 5 x 5 power. Skin: No rashes,  Psychiatry: Mood & affect appropriate.      Data Reviewed:  I have personally reviewed following labs and imaging studies   CBC Lab Results  Component Value Date   WBC 6.9 01/13/2022   RBC 3.67 (L) 01/13/2022   HGB 11.3 (L) 01/13/2022   HCT 36.0 (L) 01/13/2022   MCV 98.1 01/13/2022   MCH 30.8 01/13/2022   PLT 312 01/13/2022   MCHC 31.4 01/13/2022   RDW 14.2 01/13/2022   LYMPHSABS 0.6 (L) 01/12/2022   MONOABS 0.8 01/12/2022   EOSABS 0.0 01/12/2022   BASOSABS 0.1 58/85/0277     Last metabolic panel Lab Results  Component Value Date   NA 137 01/15/2022   K 4.2 01/15/2022   CL 103 01/15/2022   CO2 28 01/15/2022   BUN 15 01/15/2022   CREATININE 0.98 01/15/2022   GLUCOSE 94 01/15/2022   GFRNONAA >60 01/15/2022   GFRAA >60 11/02/2019   CALCIUM 8.4 (L) 01/15/2022   PROT 6.6 01/13/2022   ALBUMIN 2.7 (L) 01/13/2022   BILITOT 0.4 01/13/2022   ALKPHOS 84 01/13/2022   AST 23 01/13/2022   ALT 18 01/13/2022   ANIONGAP 6 01/15/2022    CBG (last 3)  No results for input(s): "GLUCAP" in the last 72 hours.    Coagulation Profile: Recent Labs  Lab 01/12/22 0945  INR 2.0*      Radiology Studies: No results found.     Hosie Poisson M.D. [facility] 01/15/2022, 12:47 PM  Available via Epic secure chat 7am-7pm After 7 pm, please refer to night coverage provider listed on amion.

## 2022-01-15 NOTE — Progress Notes (Signed)
PHARMACIST - PHYSICIAN COMMUNICATION DR:   Karleen Hampshire CONCERNING: Antibiotic IV to Oral Route Change Policy  RECOMMENDATION: This patient is receiving azithromycin by the intravenous route.  Based on criteria approved by the Pharmacy and Therapeutics Committee, the antibiotic(s) is/are being converted to the equivalent oral dose form(s).   DESCRIPTION: These criteria include: Patient being treated for a respiratory tract infection, urinary tract infection, cellulitis or clostridium difficile associated diarrhea if on metronidazole The patient is not neutropenic and does not exhibit a GI malabsorption state The patient is eating (either orally or via tube) and/or has been taking other orally administered medications for a least 24 hours The patient is improving clinically and has a Tmax < 100.5  If you have questions about this conversion, please contact the Pharmacy Department  []   [phone] )  [facility] []   [phone] )  [facility] []   [phone] )  [facility] []   [phone] )  [facility] [x]   [phone] )  [place], PharmD, BCPS 01/15/2022 11:09 AM

## 2022-01-16 ENCOUNTER — Inpatient Hospital Stay (HOSPITAL_COMMUNITY): Payer: No Typology Code available for payment source

## 2022-01-16 DIAGNOSIS — J189 Pneumonia, unspecified organism: Secondary | ICD-10-CM | POA: Diagnosis not present

## 2022-01-16 DIAGNOSIS — C3491 Malignant neoplasm of unspecified part of right bronchus or lung: Secondary | ICD-10-CM | POA: Diagnosis not present

## 2022-01-16 DIAGNOSIS — J9601 Acute respiratory failure with hypoxia: Secondary | ICD-10-CM | POA: Diagnosis not present

## 2022-01-16 DIAGNOSIS — Z720 Tobacco use: Secondary | ICD-10-CM | POA: Diagnosis not present

## 2022-01-16 LAB — CBC WITH DIFFERENTIAL/PLATELET
Abs Immature Granulocytes: 0.04 10*3/uL (ref 0.00–0.07)
Basophils Absolute: 0 10*3/uL (ref 0.0–0.1)
Basophils Relative: 1 %
Eosinophils Absolute: 0.1 10*3/uL (ref 0.0–0.5)
Eosinophils Relative: 1 %
HCT: 41 % (ref 39.0–52.0)
Hemoglobin: 13.2 g/dL (ref 13.0–17.0)
Immature Granulocytes: 1 %
Lymphocytes Relative: 10 %
Lymphs Abs: 0.6 10*3/uL — ABNORMAL LOW (ref 0.7–4.0)
MCH: 31.3 pg (ref 26.0–34.0)
MCHC: 32.2 g/dL (ref 30.0–36.0)
MCV: 97.2 fL (ref 80.0–100.0)
Monocytes Absolute: 0.6 10*3/uL (ref 0.1–1.0)
Monocytes Relative: 9 %
Neutro Abs: 4.8 10*3/uL (ref 1.7–7.7)
Neutrophils Relative %: 78 %
Platelets: 270 10*3/uL (ref 150–400)
RBC: 4.22 MIL/uL (ref 4.22–5.81)
RDW: 14 % (ref 11.5–15.5)
WBC: 6.1 10*3/uL (ref 4.0–10.5)
nRBC: 0 % (ref 0.0–0.2)

## 2022-01-16 LAB — COMPREHENSIVE METABOLIC PANEL
ALT: 25 U/L (ref 0–44)
AST: 30 U/L (ref 15–41)
Albumin: 2.9 g/dL — ABNORMAL LOW (ref 3.5–5.0)
Alkaline Phosphatase: 105 U/L (ref 38–126)
Anion gap: 10 (ref 5–15)
BUN: 15 mg/dL (ref 8–23)
CO2: 27 mmol/L (ref 22–32)
Calcium: 8.7 mg/dL — ABNORMAL LOW (ref 8.9–10.3)
Chloride: 101 mmol/L (ref 98–111)
Creatinine, Ser: 0.88 mg/dL (ref 0.61–1.24)
GFR, Estimated: >60 mL/min (ref >60)
Glucose, Bld: 120 mg/dL — ABNORMAL HIGH (ref 70–99)
Potassium: 4.1 mmol/L (ref 3.5–5.1)
Sodium: 138 mmol/L (ref 135–145)
Total Bilirubin: 0.6 mg/dL (ref 0.3–1.2)
Total Protein: 7.4 g/dL (ref 6.5–8.1)

## 2022-01-16 LAB — LACTIC ACID, PLASMA
Lactic Acid, Venous: 3 mmol/L (ref 0.5–1.9)
Lactic Acid, Venous: 2.2 mmol/L (ref 0.5–1.9)

## 2022-01-16 MED ORDER — METHYLPREDNISOLONE SODIUM SUCC 125 MG IJ SOLR
125.0000 mg | Freq: Once | INTRAMUSCULAR | Status: AC
  Administered 2022-01-16: 125 mg via INTRAVENOUS
  Filled 2022-01-16: qty 2

## 2022-01-16 MED ORDER — MIDODRINE HCL 5 MG PO TABS
5.0000 mg | ORAL_TABLET | Freq: Three times a day (TID) | ORAL | Status: DC
  Administered 2022-01-16 – 2022-01-22 (×18): 5 mg via ORAL
  Filled 2022-01-16 (×18): qty 1

## 2022-01-16 MED ORDER — FUROSEMIDE 10 MG/ML IJ SOLN
20.0000 mg | Freq: Once | INTRAMUSCULAR | Status: AC
  Administered 2022-01-16: 20 mg via INTRAVENOUS
  Filled 2022-01-16: qty 2

## 2022-01-16 MED ORDER — MIDODRINE HCL 5 MG PO TABS: 5.0000 mg | ORAL_TABLET | Freq: Three times a day (TID) | ORAL | Status: DC

## 2022-01-16 MED ORDER — SODIUM CHLORIDE 0.9 % IV SOLN
2.0000 g | Freq: Three times a day (TID) | INTRAVENOUS | Status: DC
  Administered 2022-01-16 – 2022-01-22 (×18): 2 g via INTRAVENOUS
  Filled 2022-01-16 (×18): qty 12.5

## 2022-01-16 MED ORDER — VANCOMYCIN HCL IN DEXTROSE 1-5 GM/200ML-% IV SOLN
1000.0000 mg | Freq: Two times a day (BID) | INTRAVENOUS | Status: DC
  Administered 2022-01-16 – 2022-01-19 (×6): 1000 mg via INTRAVENOUS
  Filled 2022-01-16 (×6): qty 200

## 2022-01-16 NOTE — Progress Notes (Signed)
[facility]                                                                               Philip [name], is a 66 y.o. male, DOB - Jul 16, 1955, EPP:[id number] Admit date - 01/12/2022    Outpatient Primary MD for the patient is [name], MD  LOS - 4  days    Brief summary    Philip [name] is a 66 y.o. male with medical history significant of adenocarcinoma of the lung, COPD, BPH, tobacco abuse, PE on xarelto. Presenting with shortness of breath. He was admitted for sepsis from CAP   Assessment & Plan    Assessment and Plan:   Sepsis secondary to pneumonia in the left lower lobe, completed 5 days of iv ROCEPHIN, but his oxygen demands continued to increase, currently on 5 lit of [place] oxygen.  CXR shows worsening left lower lobe opacity. Suspect post obstructive pneumonia. Changed antibiotics to  vancomycin and cefepime. Wbc count wnl. Absolute neutrophil count is wnl COVID / flu/ RVP negative.  Sepsis physiology resolved.  Continue with duo nebs and guaifenesin and Tessalon. MRSA PCR screen.      Acute COPD exacerbation:  Scattered wheezing on exam. Added IV solumedrol , first dose given.  Continue with duonebs. Remains on 5 lit/min HF .      Adeno ca of the lungs:  Follows up with Dr Julien Nordmann.    Pulmonary embolism:  Continue with Xarelto.    GERD;  Continue with PPI.    Tobacco abuse ; Counseled against further use.   Elevated lactic acid : Unclear etiology. Suspect from the worsening pneumonia.    Estimated body mass index is 29.38 kg/m as calculated from the following:   Height as of this encounter: 5\' 6"  (1.676 m).   Weight as of this encounter: 82.6 kg.  Code Status: full code.  DVT Prophylaxis:   rivaroxaban (XARELTO) tablet 20 mg   Level of Care: Level of care: Telemetry Family Communication: none at bedside.   Disposition Plan:     Remains inpatient appropriate: IV antibiotics.   Procedures:  None.  Consultants:    Oncology.   Antimicrobials:   Anti-infectives (From admission, onward)    Start     Dose/Rate Route Frequency Ordered Stop   01/16/22 1200  ceFEPIme (MAXIPIME) 2 g in sodium chloride 0.9 % 100 mL IVPB        2 g 200 mL/hr over 30 Minutes Intravenous Every 8 hours 01/16/22 1154     01/16/22 1000  azithromycin (ZITHROMAX) tablet 500 mg        500 mg Oral  Once 01/15/22 1108 01/16/22 0919   01/13/22 1000  azithromycin (ZITHROMAX) 500 mg in sodium chloride 0.9 % 250 mL IVPB  Status:  Discontinued        500 mg 250 mL/hr over 60 Minutes Intravenous Every 24 hours 01/12/22 1532 01/15/22 1108   01/12/22 2200  cefTRIAXone (ROCEPHIN) 2 g in sodium chloride 0.9 % 100 mL IVPB  Status:  Discontinued        2 g 200 mL/hr over 30 Minutes Intravenous Every 24 hours 01/12/22 1532 01/16/22 1145  01/12/22 1015  ceFEPIme (MAXIPIME) 2 g in sodium chloride 0.9 % 100 mL IVPB        2 g 200 mL/hr over 30 Minutes Intravenous  Once 01/12/22 1004 01/12/22 1109   01/12/22 1015  azithromycin (ZITHROMAX) 500 mg in sodium chloride 0.9 % 250 mL IVPB        500 mg 250 mL/hr over 60 Minutes Intravenous  Once 01/12/22 1004 01/12/22 1241        Medications  Scheduled Meds:  famotidine  20 mg Oral BID   fluticasone  1 spray Each Nare Daily   folic acid  1 mg Oral QPM   furosemide  20 mg Intravenous Once   guaiFENesin  1,200 mg Oral BID   ipratropium-albuterol  3 mL Nebulization Q6H   loratadine  10 mg Oral Daily   midodrine  5 mg Oral TID WC   rivaroxaban  20 mg Oral Q supper   tamsulosin  0.4 mg Oral QHS   Continuous Infusions:  ceFEPime (MAXIPIME) IV 2 g (01/16/22 1301)   PRN Meds:.acetaminophen **OR** acetaminophen, benzonatate, naphazoline-pheniramine, prochlorperazine    Subjective:   Philip [name] was seen and examined today. Reports feeling bad, not back to baseline, still on 5 lit of [place] oxygen.  Objective:   Vitals:   01/15/22 1926 01/15/22 2019 01/16/22 0515 01/16/22 1303  BP:  107/72  96/69 116/80  Pulse: (!) 109  81 (!) 103  Resp: 17  17 20   Temp: 97.9 F (36.6 C)  98.3 F (36.8 C) 98 F (36.7 C)  TempSrc: Oral     SpO2: (!) 87% 94% 94% 91%  Weight:      Height:        Intake/Output Summary (Last 24 hours) at 01/16/2022 1325 Last data filed at 01/16/2022 1302 Gross per 24 hour  Intake 462.48 ml  Output 400 ml  Net 62.48 ml    Filed Weights   01/12/22 0945  Weight: 82.6 kg     Exam General exam: elderly gentleman not in distress.  Respiratory system: diminished air entry at bases on 5 lit of [place] HF oxygen.  Cardiovascular system: S1 & S2 heard, RRR. No JVD,no pedal edema.  Gastrointestinal system: Abdomen is nondistended, soft and nontender.  Central nervous system: Alert and oriented. No focal neurological deficits. Extremities: Symmetric 5 x 5 power. Skin: No rashes, Psychiatry: Mood & affect appropriate.       Data Reviewed:  I have personally reviewed following labs and imaging studies   CBC Lab Results  Component Value Date   WBC 6.1 01/16/2022   RBC 4.22 01/16/2022   HGB 13.2 01/16/2022   HCT 41.0 01/16/2022   MCV 97.2 01/16/2022   MCH 31.3 01/16/2022   PLT 270 01/16/2022   MCHC 32.2 01/16/2022   RDW 14.0 01/16/2022   LYMPHSABS 0.6 (L) 01/16/2022   MONOABS 0.6 01/16/2022   EOSABS 0.1 01/16/2022   BASOSABS 0.0 94/85/4627     Last metabolic panel Lab Results  Component Value Date   NA 137 01/15/2022   K 4.2 01/15/2022   CL 103 01/15/2022   CO2 28 01/15/2022   BUN 15 01/15/2022   CREATININE 0.98 01/15/2022   GLUCOSE 94 01/15/2022   GFRNONAA >60 01/15/2022   GFRAA >60 11/02/2019   CALCIUM 8.4 (L) 01/15/2022   PROT 6.6 01/13/2022   ALBUMIN 2.7 (L) 01/13/2022   BILITOT 0.4 01/13/2022   ALKPHOS 84 01/13/2022   AST 23 01/13/2022   ALT 18 01/13/2022  ANIONGAP 6 01/15/2022    CBG (last 3)  No results for input(s): "GLUCAP" in the last 72 hours.    Coagulation Profile: Recent Labs  Lab 01/12/22 0945   INR 2.0*      Radiology Studies: DG CHEST PORT 1 VIEW  Result Date: 01/16/2022 CLINICAL DATA:  COPD.  Dyspnea. EXAM: PORTABLE CHEST 1 VIEW COMPARISON:  Chest radiographs 01/12/2022 and 10/23/2021; CT chest 01/07/2022 FINDINGS: Cardiac silhouette is again mildly enlarged. Moderately decreased lung volumes are similar to prior. Chronic superior right lung scarring is similar to multiple prior radiographs and CT. Moderate left lower lung interstitial thickening is seen on prior remote baseline radiographs including 10/23/2021 and there is left mid and lower lung heterogeneous airspace opacification that is slightly increased from 01/12/2022 and new from 10/23/2021. No definite pleural effusion. No pneumothorax. Mild-to-moderate multilevel degenerative disc changes of the thoracic spine. IMPRESSION: 1. Moderate left lower lung heterogeneous airspace opacification is slightly increased from 01/12/2022 and new from 10/23/2021. This may represent pneumonia or asymmetric pulmonary edema. 2. Chronic superior right lung linear scarring is similar to multiple prior radiographs and CT. Electronically Signed   By: Yvonne Kendall M.D.   On: 01/16/2022 10:06       Hosie Poisson M.D. [facility] 01/16/2022, 1:25 PM  Available via Epic secure chat 7am-7pm After 7 pm, please refer to night coverage provider listed on amion.

## 2022-01-16 NOTE — Progress Notes (Signed)
Lactic acid 3.0.  Dr. Karleen Hampshire made aware.  Virginia Rochester, RN

## 2022-01-16 NOTE — Progress Notes (Signed)
   01/16/22 2046  Assess: MEWS Score  Temp (!) 97.5 F (36.4 C)  BP (!) 116/93  MAP (mmHg) 98  Pulse Rate (!) 110  Resp (!) 22  SpO2 96 %  Assess: MEWS Score  MEWS Temp 0  MEWS Systolic 0  MEWS Pulse 1  MEWS RR 1  MEWS LOC 0  MEWS Score 2  MEWS Score Color Yellow  Assess: if the MEWS score is Yellow or Red  Were vital signs taken at a resting state? Yes  Focused Assessment Change from prior assessment (see assessment flowsheet)  Does the patient meet 2 or more of the SIRS criteria? Yes  Does the patient have a confirmed or suspected source of infection? Yes  Provider and Rapid Response Notified? Yes  MEWS guidelines implemented *See Row Information* Yes  Treat  MEWS Interventions Administered scheduled meds/treatments  Pain Scale 0-10  Pain Score 0  Take Vital Signs  Increase Vital Sign Frequency  Yellow: Q 2hr X 2 then Q 4hr X 2, if remains yellow, continue Q 4hrs  Escalate  MEWS: Escalate Yellow: discuss with charge nurse/RN and consider discussing with provider and RRT  Notify: Charge Nurse/RN  Name of Charge Nurse/RN Notified [place]  Date Charge Nurse/RN Notified 01/16/22  Time Charge Nurse/RN Notified 2046  Provider Notification  Provider Name/Title Zebedee Iba  Date Provider Notified 01/16/22  Time Provider Notified 2047  Method of Notification Page  Notification Reason Change in status  Provider response No new orders  Document  Patient Outcome Stabilized after interventions  Progress note created (see row info) Yes  Assess: SIRS CRITERIA  SIRS Temperature  0  SIRS Pulse 1  SIRS Respirations  1  SIRS WBC 0  SIRS Score Sum  2

## 2022-01-16 NOTE — Progress Notes (Signed)
Patient became very short of breath after ambulating to the bathroom.  Oxygen saturation dropped to 60s and was unable to recover.  NRB placed on patient until he was able to breathe regularly and oxygen saturations came to the low 90s.   Patient assisted back to bed.  Then transitioned back to nasal canula at 6L.  Dr. Karleen Hampshire made aware.  Wife at bedside.  Virginia Rochester, RN

## 2022-01-16 NOTE — Progress Notes (Signed)
Pharmacy Antibiotic Note  Philip [name] is a 66 y.o. male who presented to ED on 01/12/2022 with SOB. CXR on 01/12/22 showed "increased bilateral interstitial and patchy left lung opacities" and he was started on azithromycin and ceftriaxone on admission for suspected PNA.  On 12/15, pharmacy has been consulted to escalate abx to cefepime, vancomycin.   Plan: - cefepime 2 gm IV q8h - vancomycin 1g IV q12h for estimated AUC 507 using SCr 0.88, Vd 0.72 - check vancomycin levels at steady state, goal AUC 400-550 - Follow up renal function & cultures _____________________________________  Height: 5\' 6"  (167.6 cm) Weight: 82.6 kg (182 lb) IBW/kg (Calculated) : 63.8  Temp (24hrs), Avg:98.1 F (36.7 C), Min:97.9 F (36.6 C), Max:98.3 F (36.8 C)  Recent Labs  Lab 01/12/22 0840 01/12/22 0945 01/12/22 1200 01/12/22 1824 01/13/22 0522 01/15/22 1139 01/16/22 1231 01/16/22 1513  WBC 8.1  --   --   --  6.9  --  6.1  --   CREATININE 1.24  --   --   --  1.17 0.98 0.88  --   LATICACIDVEN  --  1.7 2.4* 2.2*  --   --  3.0* 2.2*     Estimated Creatinine Clearance: 83.3 mL/min (by C-G formula based on SCr of 0.88 mg/dL).    Allergies  Allergen Reactions   Carboplatin Anaphylaxis, Shortness Of Breath, Cough and Hypertension    On dose 8. Became short of breath. Symptom management was called. Received benadryl, solumedrol, Pepcid and fluids. Medication discontinued.    Klonopin [Clonazepam] Other (See Comments)    nervous   Norco [Hydrocodone-Acetaminophen] Other (See Comments)    Made pt feel jittery    12/11 cefepime x1 12/11 CTX>> 12/15 12/11 Azithro>>12/15 12/15 Cefepime >> 12/15 Vanc >>  12/11 bcx x2: ngtd 12/15 MRSA PCR:  Thank you for allowing pharmacy to be a part of this patient's care.  Peggyann Juba, PharmD, BCPS Pharmacy: [phone] 01/16/2022 7:08 PM

## 2022-01-16 NOTE — Progress Notes (Signed)
Pharmacy Antibiotic Note  Philip [name] is a 66 y.o. male who presented to ED on 01/12/2022 with SOB. CXR on 01/12/22 showed "increased bilateral interstitial and patchy left lung opacities" and he was started on azithromycin and ceftriaxone on admission for suspected PNA.  On 12/15, pharmacy has been consulted to escalate abx to cefepime.   Plan: - cefepime 2 gm IV q8h - pharmacy will sign off for abx consult.  Reconsult [place] if need further assistance.  _____________________________________  Height: 5\' 6"  (167.6 cm) Weight: 82.6 kg (182 lb) IBW/kg (Calculated) : 63.8  Temp (24hrs), Avg:97.8 F (36.6 C), Min:97.3 F (36.3 C), Max:98.3 F (36.8 C)  Recent Labs  Lab 01/12/22 0840 01/12/22 0945 01/12/22 1200 01/12/22 1824 01/13/22 0522 01/15/22 1139  WBC 8.1  --   --   --  6.9  --   CREATININE 1.24  --   --   --  1.17 0.98  LATICACIDVEN  --  1.7 2.4* 2.2*  --   --     Estimated Creatinine Clearance: 74.8 mL/min (by C-G formula based on SCr of 0.98 mg/dL).    Allergies  Allergen Reactions   Carboplatin Anaphylaxis, Shortness Of Breath, Cough and Hypertension    On dose 8. Became short of breath. Symptom management was called. Received benadryl, solumedrol, Pepcid and fluids. Medication discontinued.    Klonopin [Clonazepam] Other (See Comments)    nervous   Norco [Hydrocodone-Acetaminophen] Other (See Comments)    Made pt feel jittery      Thank you for allowing pharmacy to be a part of this patient's care.  Lynelle Doctor 01/16/2022 12:37 PM

## 2022-01-17 DIAGNOSIS — J189 Pneumonia, unspecified organism: Secondary | ICD-10-CM | POA: Diagnosis not present

## 2022-01-17 DIAGNOSIS — C3491 Malignant neoplasm of unspecified part of right bronchus or lung: Secondary | ICD-10-CM | POA: Diagnosis not present

## 2022-01-17 DIAGNOSIS — J9601 Acute respiratory failure with hypoxia: Secondary | ICD-10-CM | POA: Diagnosis not present

## 2022-01-17 DIAGNOSIS — Z720 Tobacco use: Secondary | ICD-10-CM | POA: Diagnosis not present

## 2022-01-17 LAB — CULTURE, BLOOD (ROUTINE X 2)
Culture: NO GROWTH
Culture: NO GROWTH
Special Requests: ADEQUATE
Special Requests: ADEQUATE

## 2022-01-17 MED ORDER — FUROSEMIDE 10 MG/ML IJ SOLN
20.0000 mg | Freq: Once | INTRAMUSCULAR | Status: AC
  Administered 2022-01-17: 20 mg via INTRAVENOUS
  Filled 2022-01-17: qty 2

## 2022-01-17 MED ORDER — METHYLPREDNISOLONE SODIUM SUCC 125 MG IJ SOLR
60.0000 mg | Freq: Every day | INTRAMUSCULAR | Status: DC
  Administered 2022-01-17 – 2022-01-19 (×3): 60 mg via INTRAVENOUS
  Filled 2022-01-17 (×3): qty 2

## 2022-01-17 MED ORDER — PANTOPRAZOLE SODIUM 40 MG PO TBEC
40.0000 mg | DELAYED_RELEASE_TABLET | Freq: Every day | ORAL | Status: DC
  Administered 2022-01-17 – 2022-01-22 (×6): 40 mg via ORAL
  Filled 2022-01-17 (×6): qty 1

## 2022-01-17 MED ORDER — ALUM & MAG HYDROXIDE-SIMETH 200-200-20 MG/5ML PO SUSP
15.0000 mL | Freq: Four times a day (QID) | ORAL | Status: DC | PRN
  Administered 2022-01-20: 15 mL via ORAL
  Filled 2022-01-17: qty 30

## 2022-01-17 MED ORDER — CALCIUM CARBONATE ANTACID 500 MG PO CHEW
1.0000 | CHEWABLE_TABLET | Freq: Three times a day (TID) | ORAL | Status: DC
  Administered 2022-01-17 – 2022-01-21 (×12): 200 mg via ORAL
  Filled 2022-01-17 (×13): qty 1

## 2022-01-17 NOTE — Progress Notes (Addendum)
Mobility Specialist - Progress Note   01/17/22 1519  Oxygen Therapy  O2 Device Nasal Cannula  O2 Flow Rate (L/min) 6 L/min  Mobility  Activity Ambulated with assistance in hallway;Ambulated with assistance to bathroom  Level of Assistance Modified independent, requires aide device or extra time  Assistive Device Front wheel walker  Distance Ambulated (ft) 480 ft  Activity Response Tolerated well  Mobility Referral Yes  $Mobility charge 1 Mobility   Pt received in bed and agreeable to mobility. Pt requested assistance to bathroom. No complaints during mobility session, just some SOB. Nurse notified of O2 desat in hallway. Pt required 2x standing pursed lip breathing breaks. Takes more than 59mn for O2 to come back up to 90%.  Pt to bed after session with all needs met.    Pre-mobility: 107bpm HR, 92%  SpO2 (6L) During mobility: 118bpm HR, 87% SpO2 (6L) Post-mobility: 107bpm HR, 90% SPO2   MSet designer

## 2022-01-17 NOTE — Progress Notes (Signed)
[facility]                                                                               Massimiliano Rohleder, is a 66 y.o. male, DOB - [birth year], EVO:[id number] Admit date - 01/12/2022    Outpatient Primary MD for the patient is [name], MD  LOS - 5  days    Brief summary    Philip [name] is a 66 y.o. male with medical history significant of adenocarcinoma of the lung, COPD, BPH, tobacco abuse, PE on xarelto. Presenting with shortness of breath. He was admitted for sepsis from CAP   Assessment & Plan    Assessment and Plan:   Sepsis secondary to pneumonia in the left lower lobe, completed 5 days of iv ROCEPHIN, but his oxygen demands continued to increase, currently on 5 lit of HF oxygen.  CXR shows worsening left lower lobe opacity. Suspect post obstructive pneumonia. Changed antibiotics to  vancomycin and cefepime. Wbc count wnl. Absolute neutrophil count is wnl. Trial of lasix 20 mg for the next two days.  COVID / flu/ RVP negative.  Sepsis physiology resolved.  Continue with duo nebs and guaifenesin and Tessalon. MRSA PCR screen.      Acute COPD exacerbation:  No wheezing today. Air entry better on exam.  Added IV solumedrol . Patient reports feeling better.  Continue with duonebs. Remains on 5 lit/min HF .   Recommend ambulation.     Adeno ca of the lungs:  Follows up with Dr Julien Nordmann.    Pulmonary embolism:  Continue with Xarelto.    GERD;  Continue with PPI.    Tobacco abuse ; Counseled against further use.   Elevated lactic acid : Unclear etiology. Suspect from the worsening pneumonia.    Estimated body mass index is 29.38 kg/m as calculated from the following:   Height as of this encounter: 5\' 6"  (1.676 m).   Weight as of this encounter: 82.6 kg.  Code Status: full code.  DVT Prophylaxis:   rivaroxaban (XARELTO) tablet 20 mg   Level of Care: Level of care: Telemetry Family Communication: none at bedside.   Disposition  Plan:     Remains inpatient appropriate: IV antibiotics.   Procedures:  None.  Consultants:   Oncology.   Antimicrobials:   Anti-infectives (From admission, onward)    Start     Dose/Rate Route Frequency Ordered Stop   01/16/22 2000  vancomycin (VANCOCIN) IVPB 1000 mg/200 mL premix        1,000 mg 200 mL/hr over 60 Minutes Intravenous Every 12 hours 01/16/22 1906     01/16/22 1200  ceFEPIme (MAXIPIME) 2 g in sodium chloride 0.9 % 100 mL IVPB        2 g 200 mL/hr over 30 Minutes Intravenous Every 8 hours 01/16/22 1154     01/16/22 1000  azithromycin (ZITHROMAX) tablet 500 mg        500 mg Oral  Once 01/15/22 1108 01/16/22 0919   01/13/22 1000  azithromycin (ZITHROMAX) 500 mg in sodium chloride 0.9 % 250 mL IVPB  Status:  Discontinued        500 mg 250 mL/hr over 60  Minutes Intravenous Every 24 hours 01/12/22 1532 01/15/22 1108   01/12/22 2200  cefTRIAXone (ROCEPHIN) 2 g in sodium chloride 0.9 % 100 mL IVPB  Status:  Discontinued        2 g 200 mL/hr over 30 Minutes Intravenous Every 24 hours 01/12/22 1532 01/16/22 1145   01/12/22 1015  ceFEPIme (MAXIPIME) 2 g in sodium chloride 0.9 % 100 mL IVPB        2 g 200 mL/hr over 30 Minutes Intravenous  Once 01/12/22 1004 01/12/22 1109   01/12/22 1015  azithromycin (ZITHROMAX) 500 mg in sodium chloride 0.9 % 250 mL IVPB        500 mg 250 mL/hr over 60 Minutes Intravenous  Once 01/12/22 1004 01/12/22 1241        Medications  Scheduled Meds:  famotidine  20 mg Oral BID   fluticasone  1 spray Each Nare Daily   folic acid  1 mg Oral QPM   guaiFENesin  1,200 mg Oral BID   ipratropium-albuterol  3 mL Nebulization Q6H   loratadine  10 mg Oral Daily   methylPREDNISolone (SOLU-MEDROL) injection  60 mg Intravenous Daily   midodrine  5 mg Oral TID WC   rivaroxaban  20 mg Oral Q supper   tamsulosin  0.4 mg Oral QHS   Continuous Infusions:  ceFEPime (MAXIPIME) IV 2 g (01/17/22 1213)   vancomycin 1,000 mg (01/17/22 0810)   PRN  Meds:.acetaminophen **OR** acetaminophen, benzonatate, naphazoline-pheniramine, prochlorperazine    Subjective:   Philip [name] was seen and examined today.  Patient reports feeling a little better today  No chest pain .  Objective:   Vitals:   01/17/22 0816 01/17/22 0952 01/17/22 1343 01/17/22 1422  BP:  99/73  106/81  Pulse:  98  (!) 103  Resp:  18  18  Temp:  97.6 F (36.4 C)  (!) 97.5 F (36.4 C)  TempSrc:  Oral  Oral  SpO2: 95% 95% 94% 92%  Weight:      Height:        Intake/Output Summary (Last 24 hours) at 01/17/2022 1428 Last data filed at 01/17/2022 1354 Gross per 24 hour  Intake 1146.35 ml  Output 2625 ml  Net -1478.65 ml    Filed Weights   01/12/22 0945  Weight: 82.6 kg     Exam General exam: Appears calm and comfortable  Respiratory system: air entry fair, on 5 lit of [place] oxygen.  Cardiovascular system: S1 & S2 heard, RRR. No JVD,  Gastrointestinal system: Abdomen is nondistended, soft and nontender.  Central nervous system: Alert and oriented. No focal neurological deficits. Extremities: Symmetric 5 x 5 power. Skin: No rashes, lesions or ulcers Psychiatry:  Mood & affect appropriate.        Data Reviewed:  I have personally reviewed following labs and imaging studies   CBC Lab Results  Component Value Date   WBC 6.1 01/16/2022   RBC 4.22 01/16/2022   HGB 13.2 01/16/2022   HCT 41.0 01/16/2022   MCV 97.2 01/16/2022   MCH 31.3 01/16/2022   PLT 270 01/16/2022   MCHC 32.2 01/16/2022   RDW 14.0 01/16/2022   LYMPHSABS 0.6 (L) 01/16/2022   MONOABS 0.6 01/16/2022   EOSABS 0.1 01/16/2022   BASOSABS 0.0 35/57/3220     Last metabolic panel Lab Results  Component Value Date   NA 138 01/16/2022   K 4.1 01/16/2022   CL 101 01/16/2022   CO2 27 01/16/2022   BUN 15 01/16/2022  CREATININE 0.88 01/16/2022   GLUCOSE 120 (H) 01/16/2022   GFRNONAA >60 01/16/2022   GFRAA >60 11/02/2019   CALCIUM 8.7 (L) 01/16/2022   PROT 7.4 01/16/2022    ALBUMIN 2.9 (L) 01/16/2022   BILITOT 0.6 01/16/2022   ALKPHOS 105 01/16/2022   AST 30 01/16/2022   ALT 25 01/16/2022   ANIONGAP 10 01/16/2022    CBG (last 3)  No results for input(s): "GLUCAP" in the last 72 hours.    Coagulation Profile: Recent Labs  Lab 01/12/22 0945  INR 2.0*      Radiology Studies: DG CHEST PORT 1 VIEW  Result Date: 01/16/2022 CLINICAL DATA:  COPD.  Dyspnea. EXAM: PORTABLE CHEST 1 VIEW COMPARISON:  Chest radiographs 01/12/2022 and 10/23/2021; CT chest 01/07/2022 FINDINGS: Cardiac silhouette is again mildly enlarged. Moderately decreased lung volumes are similar to prior. Chronic superior right lung scarring is similar to multiple prior radiographs and CT. Moderate left lower lung interstitial thickening is seen on prior remote baseline radiographs including 10/23/2021 and there is left mid and lower lung heterogeneous airspace opacification that is slightly increased from 01/12/2022 and new from 10/23/2021. No definite pleural effusion. No pneumothorax. Mild-to-moderate multilevel degenerative disc changes of the thoracic spine. IMPRESSION: 1. Moderate left lower lung heterogeneous airspace opacification is slightly increased from 01/12/2022 and new from 10/23/2021. This may represent pneumonia or asymmetric pulmonary edema. 2. Chronic superior right lung linear scarring is similar to multiple prior radiographs and CT. Electronically Signed   By: Yvonne Kendall M.D.   On: 01/16/2022 10:06       Hosie Poisson M.D. [facility] 01/17/2022, 2:28 PM  Available via Epic secure chat 7am-7pm After 7 pm, please refer to night coverage provider listed on amion.

## 2022-01-17 NOTE — Progress Notes (Signed)
Mobility Specialist - Progress Note   01/17/22 1151  Oxygen Therapy  O2 Device Nasal Cannula  O2 Flow Rate (L/min) 6 L/min  Mobility  Activity Ambulated with assistance in hallway  Level of Assistance Modified independent, requires aide device or extra time  Distance Ambulated (ft) 500 ft  Activity Response Tolerated well  Mobility Referral Yes  $Mobility charge 1 Mobility   Pt received in bed and agreeable to mobility. Pt placed on 6L during ambulation due to O2 tank not having a setting of 5L. Pt had some SOB throughout ambulation due to O2 dropping, but stated he was okay. O2 dropped to 88% in hallway. Encouraged pursed lip breathing which brough O2 back up to 90%. Pt to bed after session with all needs met.    Pre-mobility: 100 HR, 93% SpO2 During mobility: 110 HR, 88% SpO2 Post-mobility: 105 HR, 93% SPO2  Set designer

## 2022-01-18 DIAGNOSIS — J189 Pneumonia, unspecified organism: Secondary | ICD-10-CM | POA: Diagnosis not present

## 2022-01-18 DIAGNOSIS — J9601 Acute respiratory failure with hypoxia: Secondary | ICD-10-CM | POA: Diagnosis not present

## 2022-01-18 DIAGNOSIS — Z720 Tobacco use: Secondary | ICD-10-CM | POA: Diagnosis not present

## 2022-01-18 DIAGNOSIS — C3491 Malignant neoplasm of unspecified part of right bronchus or lung: Secondary | ICD-10-CM | POA: Diagnosis not present

## 2022-01-18 LAB — CBC WITH DIFFERENTIAL/PLATELET
Abs Immature Granulocytes: 0.14 10*3/uL — ABNORMAL HIGH (ref 0.00–0.07)
Basophils Absolute: 0 10*3/uL (ref 0.0–0.1)
Basophils Relative: 0 %
Eosinophils Absolute: 0 10*3/uL (ref 0.0–0.5)
Eosinophils Relative: 0 %
HCT: 37.1 % — ABNORMAL LOW (ref 39.0–52.0)
Hemoglobin: 11.7 g/dL — ABNORMAL LOW (ref 13.0–17.0)
Immature Granulocytes: 1 %
Lymphocytes Relative: 6 %
Lymphs Abs: 0.8 10*3/uL (ref 0.7–4.0)
MCH: 31 pg (ref 26.0–34.0)
MCHC: 31.5 g/dL (ref 30.0–36.0)
MCV: 98.1 fL (ref 80.0–100.0)
Monocytes Absolute: 1.3 10*3/uL — ABNORMAL HIGH (ref 0.1–1.0)
Monocytes Relative: 9 %
Neutro Abs: 12.1 10*3/uL — ABNORMAL HIGH (ref 1.7–7.7)
Neutrophils Relative %: 84 %
Platelets: 319 10*3/uL (ref 150–400)
RBC: 3.78 MIL/uL — ABNORMAL LOW (ref 4.22–5.81)
RDW: 14 % (ref 11.5–15.5)
WBC: 14.3 10*3/uL — ABNORMAL HIGH (ref 4.0–10.5)
nRBC: 0 % (ref 0.0–0.2)

## 2022-01-18 LAB — BASIC METABOLIC PANEL
Anion gap: 10 (ref 5–15)
BUN: 24 mg/dL — ABNORMAL HIGH (ref 8–23)
CO2: 27 mmol/L (ref 22–32)
Calcium: 8.9 mg/dL (ref 8.9–10.3)
Chloride: 103 mmol/L (ref 98–111)
Creatinine, Ser: 1.15 mg/dL (ref 0.61–1.24)
GFR, Estimated: >60 mL/min (ref >60)
Glucose, Bld: 114 mg/dL — ABNORMAL HIGH (ref 70–99)
Potassium: 4.4 mmol/L (ref 3.5–5.1)
Sodium: 140 mmol/L (ref 135–145)

## 2022-01-18 NOTE — Progress Notes (Signed)
[facility]                                                                               Philip [name], is a 66 y.o. male, DOB - 1955/05/20, KZL:[id number] Admit date - 01/12/2022    Outpatient Primary MD for the patient is Philip Pepper, MD  LOS - 6  days    Brief summary    Philip [name] is a 66 y.o. male with medical history significant of adenocarcinoma of the lung, COPD, BPH, tobacco abuse, PE on xarelto. Presenting with shortness of breath. He was admitted for sepsis from CAP   Assessment & Plan    Assessment and Plan:   Sepsis secondary to pneumonia in the left lower lobe, completed 5 days of iv ROCEPHIN, but his oxygen demands continued to increase, currently requiring up to  5 lit of HF oxygen to keep sats greater than 90%.  CXR shows worsening left lower lobe opacity. Suspect post obstructive pneumonia. Changed antibiotics to  vancomycin and cefepime. Wbc count wnl. Absolute neutrophil count is wnl.  COVID / flu/ RVP negative.  Sepsis physiology appears to have resolved.  Continue with duo nebs and guaifenesin and Tessalon. MRSA PCR screen ordered and pending.  Suspect he will need oxygen for discharge.      Acute COPD exacerbation:  No wheezing today. Air entry better on exam.  Started on IV solumedrol , start transitioning to oral steroids in am.  Patient reports feeling better.  Continue with duonebs. Remains on 5 lit/min HF .   Recommend ambulation.     Adeno ca of the lungs:  Follows up with Dr Julien Nordmann.    Pulmonary embolism:  Continue with Xarelto.    GERD;  Continue with PPI.    Tobacco abuse ; Counseled against further use.   Elevated lactic acid : Unclear etiology. Suspect from the worsening pneumonia.    Estimated body mass index is 29.38 kg/m as calculated from the following:   Height as of this encounter: 5\' 6"  (1.676 m).   Weight as of this encounter: 82.6 kg.  Code Status: full code.  DVT Prophylaxis:    rivaroxaban (XARELTO) tablet 20 mg   Level of Care: Level of care: Telemetry Family Communication: none at bedside.   Disposition Plan:     Remains inpatient appropriate: IV antibiotics.   Procedures:  None.  Consultants:   Oncology.   Antimicrobials:   Anti-infectives (From admission, onward)    Start     Dose/Rate Route Frequency Ordered Stop   01/16/22 2000  vancomycin (VANCOCIN) IVPB 1000 mg/200 mL premix        1,000 mg 200 mL/hr over 60 Minutes Intravenous Every 12 hours 01/16/22 1906     01/16/22 1200  ceFEPIme (MAXIPIME) 2 g in sodium chloride 0.9 % 100 mL IVPB        2 g 200 mL/hr over 30 Minutes Intravenous Every 8 hours 01/16/22 1154     01/16/22 1000  azithromycin (ZITHROMAX) tablet 500 mg        500 mg Oral  Once 01/15/22 1108 01/16/22 0919   01/13/22 1000  azithromycin (ZITHROMAX) 500 mg in sodium chloride  0.9 % 250 mL IVPB  Status:  Discontinued        500 mg 250 mL/hr over 60 Minutes Intravenous Every 24 hours 01/12/22 1532 01/15/22 1108   01/12/22 2200  cefTRIAXone (ROCEPHIN) 2 g in sodium chloride 0.9 % 100 mL IVPB  Status:  Discontinued        2 g 200 mL/hr over 30 Minutes Intravenous Every 24 hours 01/12/22 1532 01/16/22 1145   01/12/22 1015  ceFEPIme (MAXIPIME) 2 g in sodium chloride 0.9 % 100 mL IVPB        2 g 200 mL/hr over 30 Minutes Intravenous  Once 01/12/22 1004 01/12/22 1109   01/12/22 1015  azithromycin (ZITHROMAX) 500 mg in sodium chloride 0.9 % 250 mL IVPB        500 mg 250 mL/hr over 60 Minutes Intravenous  Once 01/12/22 1004 01/12/22 1241        Medications  Scheduled Meds:  calcium carbonate  1 tablet Oral TID WC   famotidine  20 mg Oral BID   fluticasone  1 spray Each Nare Daily   folic acid  1 mg Oral QPM   guaiFENesin  1,200 mg Oral BID   ipratropium-albuterol  3 mL Nebulization Q6H   loratadine  10 mg Oral Daily   methylPREDNISolone (SOLU-MEDROL) injection  60 mg Intravenous Daily   midodrine  5 mg Oral TID WC    pantoprazole  40 mg Oral Daily   rivaroxaban  20 mg Oral Q supper   tamsulosin  0.4 mg Oral QHS   Continuous Infusions:  ceFEPime (MAXIPIME) IV 2 g (01/18/22 1252)   vancomycin 1,000 mg (01/18/22 0820)   PRN Meds:.acetaminophen **OR** acetaminophen, alum & mag hydroxide-simeth, benzonatate, naphazoline-pheniramine, prochlorperazine    Subjective:   [name] was seen and examined today.  Denies chest pain. Reports sob improving. But still feels very congested. Patient is already on flonase and loratidine.  Objective:   Vitals:   01/18/22 0655 01/18/22 0933 01/18/22 1356 01/18/22 1518  BP: 99/79  107/87   Pulse: 89  95   Resp: 18  18   Temp: 97.7 F (36.5 C)  97.9 F (36.6 C)   TempSrc: Oral  Oral   SpO2: 95% 91% 96% 94%  Weight:      Height:        Intake/Output Summary (Last 24 hours) at 01/18/2022 1807 Last data filed at 01/18/2022 1800 Gross per 24 hour  Intake 700 ml  Output 2125 ml  Net -1425 ml    Filed Weights   01/12/22 0945  Weight: 82.6 kg     Exam General exam: Appears calm and comfortable  Respiratory system: diminished air entry at bases, no wheezing heard. On 5 lit of [place] oxygen.  Cardiovascular system: S1 & S2 heard, RRR. No JVD,  No pedal edema. Gastrointestinal system: Abdomen is nondistended, soft and nontender.  Central nervous system: Alert and oriented. No focal neurological deficits. Extremities: Symmetric 5 x 5 power. Skin: No rashes, lesions or ulcers Psychiatry:  Mood & affect appropriate.         Data Reviewed:  I have personally reviewed following labs and imaging studies   CBC Lab Results  Component Value Date   WBC 14.3 (H) 01/18/2022   RBC 3.78 (L) 01/18/2022   HGB 11.7 (L) 01/18/2022   HCT 37.1 (L) 01/18/2022   MCV 98.1 01/18/2022   MCH 31.0 01/18/2022   PLT 319 01/18/2022   MCHC 31.5 01/18/2022   RDW 14.0  01/18/2022   LYMPHSABS 0.8 01/18/2022   MONOABS 1.3 (H) 01/18/2022   EOSABS 0.0 01/18/2022    BASOSABS 0.0 88/30/1415     Last metabolic panel Lab Results  Component Value Date   NA 140 01/18/2022   K 4.4 01/18/2022   CL 103 01/18/2022   CO2 27 01/18/2022   BUN 24 (H) 01/18/2022   CREATININE 1.15 01/18/2022   GLUCOSE 114 (H) 01/18/2022   GFRNONAA >60 01/18/2022   GFRAA >60 11/02/2019   CALCIUM 8.9 01/18/2022   PROT 7.4 01/16/2022   ALBUMIN 2.9 (L) 01/16/2022   BILITOT 0.6 01/16/2022   ALKPHOS 105 01/16/2022   AST 30 01/16/2022   ALT 25 01/16/2022   ANIONGAP 10 01/18/2022    CBG (last 3)  No results for input(s): "GLUCAP" in the last 72 hours.    Coagulation Profile: Recent Labs  Lab 01/12/22 0945  INR 2.0*      Radiology Studies: No results found.     Hosie Poisson M.D. [facility] 01/18/2022, 6:07 PM  Available via Epic secure chat 7am-7pm After 7 pm, please refer to night coverage provider listed on amion.

## 2022-01-18 NOTE — Progress Notes (Signed)
Mobility Specialist - Progress Note   01/18/22 1557  Mobility  Activity Ambulated independently in hallway  Level of Assistance Independent  Assistive Device None  Distance Ambulated (ft) 500 ft  Activity Response Tolerated well  Mobility Referral Yes  $Mobility charge 1 Mobility   Nurse requested Mobility Specialist to perform oxygen saturation test with pt which includes removing pt from oxygen both at rest and while ambulating.  Below are the results from that testing.     Patient Saturations on Room Air at Rest = spO2 84%  Patient Saturations on Room Air while Ambulating = sp02 77% .  Rested and performed pursed lip breathing for 1 minute with sp02 at 84%.  Patient Saturations on 6 Liters (No 5L on O2 tank) of oxygen while Ambulating = sp02 92%  At end of testing pt left in room on 5  Liters of oxygen.  Reported results to nurse.    Pt received in bed and agreeable to mobility. Pt had some SOB throughout ambulation. Pt to bed after session with all needs met & family in room.  [facility]

## 2022-01-19 ENCOUNTER — Inpatient Hospital Stay (HOSPITAL_COMMUNITY): Payer: No Typology Code available for payment source

## 2022-01-19 DIAGNOSIS — J9601 Acute respiratory failure with hypoxia: Secondary | ICD-10-CM | POA: Diagnosis not present

## 2022-01-19 DIAGNOSIS — J189 Pneumonia, unspecified organism: Secondary | ICD-10-CM | POA: Diagnosis not present

## 2022-01-19 DIAGNOSIS — C3491 Malignant neoplasm of unspecified part of right bronchus or lung: Secondary | ICD-10-CM | POA: Diagnosis not present

## 2022-01-19 DIAGNOSIS — Z720 Tobacco use: Secondary | ICD-10-CM | POA: Diagnosis not present

## 2022-01-19 LAB — MRSA NEXT GEN BY PCR, NASAL: MRSA by PCR Next Gen: NOT DETECTED

## 2022-01-19 MED ORDER — FUROSEMIDE 10 MG/ML IJ SOLN
20.0000 mg | Freq: Once | INTRAMUSCULAR | Status: AC
  Administered 2022-01-19: 20 mg via INTRAVENOUS
  Filled 2022-01-19: qty 2

## 2022-01-19 MED ORDER — VANCOMYCIN HCL 750 MG/150ML IV SOLN
750.0000 mg | Freq: Two times a day (BID) | INTRAVENOUS | Status: DC
  Filled 2022-01-19: qty 150

## 2022-01-19 MED ORDER — PREDNISONE 20 MG PO TABS
40.0000 mg | ORAL_TABLET | Freq: Every day | ORAL | Status: AC
  Administered 2022-01-20 – 2022-01-22 (×3): 40 mg via ORAL
  Filled 2022-01-19 (×3): qty 2

## 2022-01-19 NOTE — Consult Note (Addendum)
NAME:  Philip [name], MRN:  [id number], DOB:  02-15-1955, LOS: 7 ADMISSION DATE:  01/12/2022, CONSULTATION DATE:  01/19/22 REFERRING MD:  Karleen Hampshire CHIEF COMPLAINT:  Dyspnea   History of Present Illness:  Philip [name] is a 66 y.o. 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 He is followed by Dr. [name] in our clinic, last seen 01/05/22 where his [place] was changed to [place].  He was admitted to [facility] 12/11 with dyspnea felt to be 2/2 CAP. He was started on Azithromycin and Ceftriaxone. COVID, flu, RSV were negative.   Since admission, he has had increasing O2 requirements and on 12/18, he was up to 5L to maintain sats > 90%. CXR shows improved LLL opacities but likely edema bilaterally. Abx were changed to Vanc/Cefepime and PCCM subsequently called to see in consultation. He is net -576cc since admit with past 24hr UOP documented as 1.7L. BNP on admit was 104, has not been checked since.   Pertinent  Medical History:  has Lung mass; S/P bronchoscopy; S/P bronchoscopy with biopsy; Pneumothorax; Adenocarcinoma of right lung, stage 3 ([place]); Goals of care, counseling/discussion; Encounter for antineoplastic chemotherapy; Tobacco abuse counseling; Odynophagia; Encounter for antineoplastic immunotherapy; History of pulmonary embolus (PE); COPD with acute bronchitis ([place]); Pneumonia; MAI (mycobacterium avium-intracellulare) infection ([place]); Respiratory failure, acute ([place]); Healthcare maintenance; Allergic rhinitis; Pulmonary embolism ([place]); Recurrent non-small cell lung cancer ([place]); Neutropenia ([place]); COPD, moderate ([place]); Preoperative clearance; CAP (community acquired pneumonia); Tobacco  abuse; BPH (benign prostatic hyperplasia); GERD (gastroesophageal reflux disease); and SIRS (systemic inflammatory response syndrome) ([place]) on their problem list.  Significant Hospital Events: Including procedures, antibiotic start and stop dates in addition to other pertinent events   12/11 admit. 12/18 PCCM consult.  Interim History / Subjective:  Feels that he is breathing a little better since admission. On 5 L nasal cannula, oxygen saturation 95% I have reviewed last oncology visit and pulmonary visit on 12/6  Objective:  Blood pressure 97/67, pulse 88, temperature (!) 97.4 F (36.3 C), temperature source Oral, resp. rate 18, height 5\' 6"  (1.676 m), weight 82.6 kg, SpO2 (!) 89 %.        Intake/Output Summary (Last 24 hours) at 01/19/2022 1100 Last data filed at 01/19/2022 0500 Gross per 24 hour  Intake 2183.65 ml  Output 1675 ml  Net 508.65 ml   Filed Weights   01/12/22 0945  Weight: 82.6 kg    Examination: General: Adult man sitting up in bed, no distress able to speak in full sentences Neuro: Alert, interactive, nonfocal HEENT: No JVD, mild pallor, no icterus Cardiovascular: S1-S2 distant Lungs: Faint expiratory rhonchi on left, crackles left base, no accessory muscle use, barrel chest Abdomen: S1-S2 regular, no murmur Musculoskeletal: No edema, no deformity Skin: Mild bruising  Chest x-ray shows normal electrolytes, slight increase in creatinine from 1.9-1.1, low lactate, mild leukocytosis, stable anemia    Labs/imaging personally reviewed:   Chest x-ray 12/18 shows improved aeration on left suggesting improved pneumonia.  CT chest/abdomen/pelvis 12/6 shows stable radiation changes bilateral hilar, interstitial thickening and less infiltrate in the lingula left lower lobe  Assessment & Plan:   CAP with  acute on chronic hypoxic respiratory failure - at increased risk given his immunocompromised state. Has a hx of pulmonary  MAI in 2020 resistant to  quinolones, intermediate susceptibility to linezolid. Hx COPD. Prior history of immunotherapy mediated pneumonitis, [name] was stopped after cycle 9 -Appears improved since admission but remains O2 dependent, chest x-ray shows improving left lower lobe infiltrate - Continue broadened abx of vanc/cefepime. --Obtain sputum culture if possible - Add RVP. - Continue BD's, steroids. - F/u with Dr. [name] as outpatient. He will likely need oxygen at discharge and appears to be willing to be more compliant this time. He can switch back to [place] on discharge   Hx stage III adenocarcinoma with recurrence and on maintenance chemo. - F/u with oncology.  Hx PE on Xarelto.   Hx Tobacco dependence. - Ongoing cessation counseling.  Best practice (evaluated daily):   Code Status:  full code Last date of multidisciplinary goals of care discussion: NA  Labs   CBC: Recent Labs  Lab 01/13/22 0522 01/16/22 1231 01/18/22 0630  WBC 6.9 6.1 14.3*  NEUTROABS  --  4.8 12.1*  HGB 11.3* 13.2 11.7*  HCT 36.0* 41.0 37.1*  MCV 98.1 97.2 98.1  PLT 312 270 175    Basic Metabolic Panel: Recent Labs  Lab 01/13/22 0522 01/15/22 1139 01/16/22 1231 01/18/22 0630  NA 137 137 138 140  K 4.2 4.2 4.1 4.4  CL 105 103 101 103  CO2 23 28 27 27   GLUCOSE 102* 94 120* 114*  BUN 17 15 15  24*  CREATININE 1.17 0.98 0.88 1.15  CALCIUM 8.4* 8.4* 8.7* 8.9   GFR: Estimated Creatinine Clearance: 63.7 mL/min (by C-G formula based on SCr of 1.15 mg/dL). Recent Labs  Lab 01/12/22 1200 01/12/22 1824 01/13/22 0522 01/16/22 1231 01/16/22 1513 01/18/22 0630  WBC  --   --  6.9 6.1  --  14.3*  LATICACIDVEN 2.4* 2.2*  --  3.0* 2.2*  --     Liver Function Tests: Recent Labs  Lab 01/13/22 0522 01/16/22 1231  AST 23 30  ALT 18 25  ALKPHOS 84 105  BILITOT 0.4 0.6  PROT 6.6 7.4  ALBUMIN 2.7* 2.9*   No results for input(s): "LIPASE", "AMYLASE" in the last 168 hours. No results for input(s):  "AMMONIA" in the last 168 hours.  ABG    Component Value Date/Time   HCO3 27.6 01/12/2022 0945   O2SAT 72.9 01/12/2022 0945     Coagulation Profile: No results for input(s): "INR", "PROTIME" in the last 168 hours.  Cardiac Enzymes: No results for input(s): "CKTOTAL", "CKMB", "CKMBINDEX", "TROPONINI" in the last 168 hours.  HbA1C: No results found for: "HGBA1C"  CBG: No results for input(s): "GLUCAP" in the last 168 hours.  Review of Systems:   Constitutional: negative for anorexia, fevers and sweats  Eyes: negative for irritation, redness and visual disturbance  Ears, nose, mouth, throat, and face: negative for earaches, epistaxis, nasal congestion and sore throat  Cardiovascular: negative for chest pain,lower extremity edema, orthopnea, palpitations and syncope  Gastrointestinal: negative for abdominal pain, constipation, diarrhea, melena, nausea and vomiting  Genitourinary:negative for dysuria, frequency and hematuria  Hematologic/lymphatic: negative for bleeding, easy bruising and lymphadenopathy  Musculoskeletal:negative for arthralgias, muscle weakness and stiff joints  Neurological: negative for coordination problems, gait problems, headaches and weakness  Endocrine: negative for diabetic symptoms including polydipsia, polyuria and weight loss   Past Medical History:  He,  has a past medical history of COPD (chronic obstructive pulmonary disease) ([place]), GERD (gastroesophageal reflux disease), Headache, Hyperlipidemia, lung ca (dx'd 12/2018), Pneumonia, Pulmonary embolus ([place]), and Tobacco abuse.   Surgical History:  Past Surgical History:  Procedure Laterality Date   COLONOSCOPY     CYSTOSCOPY W/ URETERAL STENT PLACEMENT Right 10/24/2021   Procedure: CYSTOSCOPY WITH RETROGRADE PYELOGRAM/URETERAL STENT PLACEMENT;  Surgeon: Janith Lima, MD;  Location: [facility];  Service: Urology;  Laterality: Right;   HAND SURGERY     VIDEO BRONCHOSCOPY WITH ENDOBRONCHIAL ULTRASOUND  N/A 12/22/2018   Procedure: VIDEO BRONCHOSCOPY WITH ENDOBRONCHIAL ULTRASOUND;  Surgeon: Garner Nash, DO;  Location: [facility];  Service: Thoracic;  Laterality: N/A;   VIDEO BRONCHOSCOPY WITH ENDOBRONCHIAL ULTRASOUND N/A 01/09/2019   Procedure: VIDEO BRONCHOSCOPY WITH ENDOBRONCHIAL ULTRASOUND WITH FLUORO;  Surgeon: Garner Nash, DO;  Location: [facility];  Service: Thoracic;  Laterality: N/A;   VIDEO BRONCHOSCOPY WITH RADIAL ENDOBRONCHIAL ULTRASOUND N/A 12/22/2018   Procedure: RADIAL ENDOBRONCHIAL ULTRASOUND;  Surgeon: Garner Nash, DO;  Location: [facility];  Service: Thoracic;  Laterality: N/A;     Social History:   reports that he has been smoking cigarettes. He started smoking about 51 years ago. He has quit using smokeless tobacco.  His smokeless tobacco use included chew. He reports current alcohol use. He reports that he does not currently use drugs after having used the following drugs: Marijuana.   Family History:  His family history includes Cancer in his father; Stomach cancer in his father.   Allergies Allergies  Allergen Reactions   Carboplatin Anaphylaxis, Shortness Of Breath, Cough and Hypertension    On dose 8. Became short of breath. Symptom management was called. Received benadryl, solumedrol, Pepcid and fluids. Medication discontinued.    Klonopin [Clonazepam] Other (See Comments)    nervous   Norco [Hydrocodone-Acetaminophen] Other (See Comments)    Made pt feel jittery      Home Medications  Prior to Admission medications   Medication Sig Start Date End Date Taking? Authorizing Provider  acetaminophen (TYLENOL) 500 MG tablet Take 500-1,000 mg by mouth every 6 (six) hours as needed (pain.).   Yes [provider]  albuterol (VENTOLIN HFA) 108 (90 Base) MCG/ACT inhaler Inhale 2 puffs into the lungs every 4 (four) hours as needed for wheezing or shortness of breath. 01/05/22  Yes Hunsucker, Bonna Gains, MD  Budeson-Glycopyrrol-Formoterol (BREZTRI AEROSPHERE)  160-9-4.8 MCG/ACT AERO Inhale 2 puffs into the lungs in the morning and at bedtime. 01/05/22  Yes Hunsucker, Bonna Gains, MD  dextromethorphan-guaiFENesin [facility] DM) 30-600 MG 12hr tablet Take 1 tablet by mouth 2 (two) times daily as needed for cough. 03/17/20  Yes [name], MD  famotidine (PEPCID) 20 MG tablet TAKE 1 TABLET BY MOUTH TWICE A DAY Patient taking differently: Take 20 mg by mouth 2 (two) times daily. 02/04/21  Yes Heilingoetter, [name], [name]  folic acid (FOLVITE) 1 MG tablet TAKE 1 TABLET BY MOUTH EVERY DAY Patient taking differently: Take 1 mg by mouth every evening. 09/01/21  Yes [name], MD  Multiple Vitamins-Minerals (MULTI ADULT GUMMIES) CHEW Chew 1 tablet by mouth in the morning and at bedtime. 09/02/21  Yes [provider]  naphazoline-pheniramine (VISINE) 0.025-0.3 % ophthalmic solution Place 1 drop into both eyes daily as needed for eye irritation.   Yes [provider]  rivaroxaban (XARELTO) 20 MG TABS tablet Take 1 tablet (20 mg total) by mouth daily with supper. 03/12/21  Yes Hunsucker, Bonna Gains, MD  tamsulosin (FLOMAX) 0.4 MG CAPS capsule Take 0.4 mg by mouth at bedtime. 10/31/21  Yes [provider]  Budeson-Glycopyrrol-Formoterol (BREZTRI AEROSPHERE) 160-9-4.8 MCG/ACT AERO Inhale 2 puffs into the lungs in the morning  and at bedtime. 01/05/22   Hunsucker, Bonna Gains, MD       Kara Mead MD. FCCP. [place] [facility] Pager : [phone]  If no response to pager , please call [phone] until 7 pm After 7:00 pm call Elink  [phone]   01/19/2022

## 2022-01-19 NOTE — Progress Notes (Signed)
Family member requested for patient to have a bath. RN explained that patient is on oxygen 5L to keep spO2 greater 90% and patient is very SOB without it. He said patient had a bath 3 days ago by family member in the bathroom and did not need MD order for that. RN explained for patient safety and would notified MD on call for this request.

## 2022-01-19 NOTE — Plan of Care (Signed)

## 2022-01-19 NOTE — Progress Notes (Signed)
[facility]                                                                               [name], is a 66 y.o. male, DOB - [birth year], LKG:[id number] Admit date - 01/12/2022    Outpatient Primary MD for the patient is Philip Pepper, MD  LOS - 7  days    Brief summary    Philip [name] is a 66 y.o. male with medical history significant of adenocarcinoma of the lung, COPD, BPH, tobacco abuse, PE on xarelto. Presenting with shortness of breath. He was admitted for sepsis from CAP   Assessment & Plan    Assessment and Plan:   Sepsis secondary to pneumonia in the left lower lobe, completed 5 days of IV rocephin but his oxygen demands continued to increase, currently requiring up to  5 lit of HF oxygen to keep sats greater than 90%.  CXR shows worsening left lower lobe opacity. Suspect post obstructive pneumonia. Changed antibiotics to  vancomycin and cefepime. Wbc count wnl. Absolute neutrophil count is wnl.  COVID / flu/ RVP negative.  Sepsis physiology appears to have resolved.  Continue with duo nebs and guaifenesin and Tessalon. MRSA PCR screen ordered and negative.  Requested PCCM consult for further evaluation.  Suspect he will need oxygen for discharge.      Acute COPD exacerbation: acute on chronic respiratory failure.  No wheezing today. Air entry better on exam.  Started on IV solumedrol transition to quick steroid taper.  Continue with duonebs. Remains on 5 lit/min HF .   Recommend ambulation.     Adeno ca of the lungs:  Follows up with Dr Julien Nordmann.    Pulmonary embolism:  Continue with Xarelto.    GERD;  Continue with PPI.    Tobacco abuse ; Counseled against further use.   Elevated lactic acid : Unclear etiology. Suspect from the worsening pneumonia. Check repeat lactic acid in am.    Estimated body mass index is 29.38 kg/m as calculated from the following:   Height as of this encounter: 5\' 6"  (1.676 m).   Weight as of  this encounter: 82.6 kg.  Code Status: full code.  DVT Prophylaxis:   rivaroxaban (XARELTO) tablet 20 mg   Level of Care: Level of care: Telemetry Family Communication: none at bedside.   Disposition Plan:     Remains inpatient appropriate: IV antibiotics.   Procedures:  None.  Consultants:   Oncology.   Antimicrobials:   Anti-infectives (From admission, onward)    Start     Dose/Rate Route Frequency Ordered Stop   01/19/22 2200  vancomycin (VANCOREADY) IVPB 750 mg/150 mL  Status:  Discontinued        750 mg 150 mL/hr over 60 Minutes Intravenous Every 12 hours 01/19/22 1005 01/19/22 1616   01/16/22 2000  vancomycin (VANCOCIN) IVPB 1000 mg/200 mL premix  Status:  Discontinued        1,000 mg 200 mL/hr over 60 Minutes Intravenous Every 12 hours 01/16/22 1906 01/19/22 1005   01/16/22 1200  ceFEPIme (MAXIPIME) 2 g in sodium chloride 0.9 % 100 mL IVPB        2 g  200 mL/hr over 30 Minutes Intravenous Every 8 hours 01/16/22 1154     01/16/22 1000  azithromycin (ZITHROMAX) tablet 500 mg        500 mg Oral  Once 01/15/22 1108 01/16/22 0919   01/13/22 1000  azithromycin (ZITHROMAX) 500 mg in sodium chloride 0.9 % 250 mL IVPB  Status:  Discontinued        500 mg 250 mL/hr over 60 Minutes Intravenous Every 24 hours 01/12/22 1532 01/15/22 1108   01/12/22 2200  cefTRIAXone (ROCEPHIN) 2 g in sodium chloride 0.9 % 100 mL IVPB  Status:  Discontinued        2 g 200 mL/hr over 30 Minutes Intravenous Every 24 hours 01/12/22 1532 01/16/22 1145   01/12/22 1015  ceFEPIme (MAXIPIME) 2 g in sodium chloride 0.9 % 100 mL IVPB        2 g 200 mL/hr over 30 Minutes Intravenous  Once 01/12/22 1004 01/12/22 1109   01/12/22 1015  azithromycin (ZITHROMAX) 500 mg in sodium chloride 0.9 % 250 mL IVPB        500 mg 250 mL/hr over 60 Minutes Intravenous  Once 01/12/22 1004 01/12/22 1241        Medications  Scheduled Meds:  calcium carbonate  1 tablet Oral TID WC   famotidine  20 mg Oral BID    fluticasone  1 spray Each Nare Daily   folic acid  1 mg Oral QPM   guaiFENesin  1,200 mg Oral BID   ipratropium-albuterol  3 mL Nebulization Q6H   loratadine  10 mg Oral Daily   methylPREDNISolone (SOLU-MEDROL) injection  60 mg Intravenous Daily   midodrine  5 mg Oral TID WC   pantoprazole  40 mg Oral Daily   rivaroxaban  20 mg Oral Q supper   tamsulosin  0.4 mg Oral QHS   Continuous Infusions:  ceFEPime (MAXIPIME) IV 2 g (01/19/22 1338)   PRN Meds:.acetaminophen **OR** acetaminophen, alum & mag hydroxide-simeth, benzonatate, naphazoline-pheniramine, prochlorperazine    Subjective:   Philip [name] was seen and examined today.  Breathing same as yesterday. No new events.  Objective:   Vitals:   01/19/22 0613 01/19/22 0738 01/19/22 1408 01/19/22 1443  BP: 97/67  (!) 109/96   Pulse: 88  95   Resp: 18     Temp: (!) 97.4 F (36.3 C)  (!) 97.5 F (36.4 C)   TempSrc: Oral  Oral   SpO2: 99% (!) 89% 100% 95%  Weight:      Height:        Intake/Output Summary (Last 24 hours) at 01/19/2022 1750 Last data filed at 01/19/2022 0500 Gross per 24 hour  Intake 1570.56 ml  Output 1175 ml  Net 395.56 ml    Filed Weights   01/12/22 0945  Weight: 82.6 kg     Exam General exam:Ill appearing gentleman, not in distress.  Respiratory system: air entry fair, no wheezing heard, on 5 lit of [place] oxygen.  Cardiovascular system: S1 & S2 heard, RRR. No JVD, . No pedal edema. Gastrointestinal system: Abdomen is nondistended, soft and nontender.  Central nervous system: Alert and oriented. No focal neurological deficits. Extremities: Symmetric 5 x 5 power. Skin: No rashes, lesions or ulcers Psychiatry:  Mood & affect appropriate.          Data Reviewed:  I have personally reviewed following labs and imaging studies   CBC Lab Results  Component Value Date   WBC 14.3 (H) 01/18/2022   RBC 3.78 (L)  01/18/2022   HGB 11.7 (L) 01/18/2022   HCT 37.1 (L) 01/18/2022   MCV 98.1  01/18/2022   MCH 31.0 01/18/2022   PLT 319 01/18/2022   MCHC 31.5 01/18/2022   RDW 14.0 01/18/2022   LYMPHSABS 0.8 01/18/2022   MONOABS 1.3 (H) 01/18/2022   EOSABS 0.0 01/18/2022   BASOSABS 0.0 46/28/6381     Last metabolic panel Lab Results  Component Value Date   NA 140 01/18/2022   K 4.4 01/18/2022   CL 103 01/18/2022   CO2 27 01/18/2022   BUN 24 (H) 01/18/2022   CREATININE 1.15 01/18/2022   GLUCOSE 114 (H) 01/18/2022   GFRNONAA >60 01/18/2022   GFRAA >60 11/02/2019   CALCIUM 8.9 01/18/2022   PROT 7.4 01/16/2022   ALBUMIN 2.9 (L) 01/16/2022   BILITOT 0.6 01/16/2022   ALKPHOS 105 01/16/2022   AST 30 01/16/2022   ALT 25 01/16/2022   ANIONGAP 10 01/18/2022    CBG (last 3)  No results for input(s): "GLUCAP" in the last 72 hours.    Coagulation Profile: No results for input(s): "INR", "PROTIME" in the last 168 hours.    Radiology Studies: DG CHEST PORT 1 VIEW  Result Date: 01/19/2022 CLINICAL DATA:  Follow-up pneumonia. EXAM: PORTABLE CHEST 1 VIEW COMPARISON:  January 16, 2022. FINDINGS: Stable cardiomediastinal silhouette. Left lung opacity noted on prior exam is slightly decreased compared to prior exam, suggesting improving pneumonia or edema. Mildly elevated right hemidiaphragm is noted with probable right upper lobe subsegmental atelectasis or scarring. Bony thorax is unremarkable. IMPRESSION: Decreased left lung opacity is noted suggesting improving pneumonia or asymmetric edema. Electronically Signed   By: Marijo Conception M.D.   On: 01/19/2022 10:10       Hosie Poisson M.D. [facility] 01/19/2022, 5:50 PM  Available via Epic secure chat 7am-7pm After 7 pm, please refer to night coverage provider listed on amion.

## 2022-01-19 NOTE — Progress Notes (Signed)
Pharmacy Antibiotic Note  Philip [name] is a 66 y.o. male who presented to ED on 01/12/2022 with SOB. CXR on 01/12/22 showed "increased bilateral interstitial and patchy left lung opacities" and he was started on azithromycin and ceftriaxone on admission for suspected PNA.  On 12/15, pharmacy has been consulted to escalate abx to cefepime, vancomycin.  Day #3 Vancomycin/Cefepime - Afebrile - WBC 14.3 (on solumedrol) - SCr up 1.15, CrCl 63 ml/min   Plan: - Continue cefepime 2 gm IV q8h - Adjust vancomycin 750mg  IV q12h for estimated AUC 487 using SCr 1.15, Vd 0.72 - Follow up MRSA PCR, per Dr. Karleen Hampshire - can dc vanc if negative - Follow up renal function & cultures _____________________________________  Height: 5\' 6"  (167.6 cm) Weight: 82.6 kg (182 lb) IBW/kg (Calculated) : 63.8  Temp (24hrs), Avg:97.5 F (36.4 C), Min:97.3 F (36.3 C), Max:97.9 F (36.6 C)  Recent Labs  Lab 01/12/22 1200 01/12/22 1824 01/13/22 0522 01/15/22 1139 01/16/22 1231 01/16/22 1513 01/18/22 0630  WBC  --   --  6.9  --  6.1  --  14.3*  CREATININE  --   --  1.17 0.98 0.88  --  1.15  LATICACIDVEN 2.4* 2.2*  --   --  3.0* 2.2*  --      Estimated Creatinine Clearance: 63.7 mL/min (by C-G formula based on SCr of 1.15 mg/dL).    Allergies  Allergen Reactions   Carboplatin Anaphylaxis, Shortness Of Breath, Cough and Hypertension    On dose 8. Became short of breath. Symptom management was called. Received benadryl, solumedrol, Pepcid and fluids. Medication discontinued.    Klonopin [Clonazepam] Other (See Comments)    nervous   Norco [Hydrocodone-Acetaminophen] Other (See Comments)    Made pt feel jittery    12/11 cefepime x1 12/11 CTX>> 12/15 12/11 Azithro>>12/15 12/15 Cefepime >> 12/15 Vanc >>  12/11 bcx x2: NGF 12/15 MRSA PCR:  Thank you for allowing pharmacy to be a part of this patient's care.  Peggyann Juba, PharmD, BCPS Pharmacy: [phone] 01/19/2022 9:59 AM

## 2022-01-20 DIAGNOSIS — J9611 Chronic respiratory failure with hypoxia: Secondary | ICD-10-CM | POA: Diagnosis not present

## 2022-01-20 DIAGNOSIS — Z720 Tobacco use: Secondary | ICD-10-CM | POA: Diagnosis not present

## 2022-01-20 DIAGNOSIS — J189 Pneumonia, unspecified organism: Secondary | ICD-10-CM | POA: Diagnosis not present

## 2022-01-20 DIAGNOSIS — J9601 Acute respiratory failure with hypoxia: Secondary | ICD-10-CM | POA: Diagnosis not present

## 2022-01-20 DIAGNOSIS — C3491 Malignant neoplasm of unspecified part of right bronchus or lung: Secondary | ICD-10-CM | POA: Diagnosis not present

## 2022-01-20 LAB — CBC WITH DIFFERENTIAL/PLATELET
Abs Immature Granulocytes: 0.48 10*3/uL — ABNORMAL HIGH (ref 0.00–0.07)
Basophils Absolute: 0.1 10*3/uL (ref 0.0–0.1)
Basophils Relative: 1 %
Eosinophils Absolute: 0 10*3/uL (ref 0.0–0.5)
Eosinophils Relative: 0 %
HCT: 38.6 % — ABNORMAL LOW (ref 39.0–52.0)
Hemoglobin: 12 g/dL — ABNORMAL LOW (ref 13.0–17.0)
Immature Granulocytes: 4 %
Lymphocytes Relative: 7 %
Lymphs Abs: 1 10*3/uL (ref 0.7–4.0)
MCH: 30.9 pg (ref 26.0–34.0)
MCHC: 31.1 g/dL (ref 30.0–36.0)
MCV: 99.5 fL (ref 80.0–100.0)
Monocytes Absolute: 1.3 10*3/uL — ABNORMAL HIGH (ref 0.1–1.0)
Monocytes Relative: 10 %
Neutro Abs: 10.4 10*3/uL — ABNORMAL HIGH (ref 1.7–7.7)
Neutrophils Relative %: 78 %
Platelets: 317 10*3/uL (ref 150–400)
RBC: 3.88 MIL/uL — ABNORMAL LOW (ref 4.22–5.81)
RDW: 14.3 % (ref 11.5–15.5)
WBC: 13.2 10*3/uL — ABNORMAL HIGH (ref 4.0–10.5)
nRBC: 0.2 % (ref 0.0–0.2)

## 2022-01-20 LAB — COMPREHENSIVE METABOLIC PANEL
ALT: 41 U/L (ref 0–44)
AST: 26 U/L (ref 15–41)
Albumin: 2.6 g/dL — ABNORMAL LOW (ref 3.5–5.0)
Alkaline Phosphatase: 82 U/L (ref 38–126)
Anion gap: 9 (ref 5–15)
BUN: 22 mg/dL (ref 8–23)
CO2: 29 mmol/L (ref 22–32)
Calcium: 8.6 mg/dL — ABNORMAL LOW (ref 8.9–10.3)
Chloride: 100 mmol/L (ref 98–111)
Creatinine, Ser: 0.91 mg/dL (ref 0.61–1.24)
GFR, Estimated: >60 mL/min (ref >60)
Glucose, Bld: 104 mg/dL — ABNORMAL HIGH (ref 70–99)
Potassium: 4.6 mmol/L (ref 3.5–5.1)
Sodium: 138 mmol/L (ref 135–145)
Total Bilirubin: 0.3 mg/dL (ref 0.3–1.2)
Total Protein: 6.7 g/dL (ref 6.5–8.1)

## 2022-01-20 LAB — RESPIRATORY PANEL BY PCR

## 2022-01-20 MED ORDER — FUROSEMIDE 10 MG/ML IJ SOLN
40.0000 mg | Freq: Once | INTRAMUSCULAR | Status: AC
  Administered 2022-01-21: 40 mg via INTRAVENOUS
  Filled 2022-01-20: qty 4

## 2022-01-20 NOTE — Plan of Care (Signed)

## 2022-01-20 NOTE — Progress Notes (Signed)
Mobility Specialist - Progress Note   01/20/22 1606  Oxygen Therapy  O2 Device Nasal Cannula  O2 Flow Rate (L/min) 3 L/min  Mobility  Activity Ambulated with assistance in hallway  Level of Assistance Modified independent, requires aide device or extra time  Assistive Device  (IV Pole)  Distance Ambulated (ft) 500 ft  Activity Response Tolerated well  Mobility Referral Yes  $Mobility charge 1 Mobility   Pt received in bed and agreeable to mobility. Pt ambulated to bathroom, upon returning O2 on 77%. Encouraged pursed lip breathing before heading into hallway which brought O2 back up to 90%. Nurse in hallway & suggested tank to be put on 4L & not 3L. Pt very SOB throughout ambulation. Pt to bed after session with all needs met.    Pre-mobility: 124 HR, 90% SpO2 (3L) (after dropping to 77% & pursed lip breathing) During mobility: 116 HR, 74% SpO2 (4L) Post-mobility: 108 HR, 90% SPO2 (~7mn to recover)  [employer]

## 2022-01-20 NOTE — Consult Note (Signed)
NAME:  Philip [name], MRN:  [id number], DOB:  [birth year], LOS: 8 ADMISSION DATE:  01/12/2022, CONSULTATION DATE:  01/19/22 REFERRING MD:  Karleen Hampshire CHIEF COMPLAINT:  Dyspnea   History of Present Illness:  Philip [name] is a 66 y.o. 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 He is followed by Dr. [name] in our clinic, last seen 01/05/22 where his [place]to was changed to [place].  He was admitted to [facility] 12/11 with dyspnea felt to be 2/2 CAP. He was started on Azithromycin and Ceftriaxone. COVID, flu, RSV were negative.   Since admission, he has had increasing O2 requirements and on 12/18, he was up to 5L to maintain sats > 90%. CXR shows improved LLL opacities but likely edema bilaterally. Abx were changed to Vanc/Cefepime and PCCM subsequently called to see in consultation. He is net -576cc since admit with past 24hr UOP documented as 1.7L. BNP on admit was 104, has not been checked since.   Pertinent  Medical History:  has Lung mass; S/P bronchoscopy; S/P bronchoscopy with biopsy; Pneumothorax; Adenocarcinoma of right lung, stage 3 ([place]); Goals of care, counseling/discussion; Encounter for antineoplastic chemotherapy; Tobacco abuse counseling; Odynophagia; Encounter for antineoplastic immunotherapy; History of pulmonary embolus (PE); COPD with acute bronchitis ([place]); Pneumonia; MAI (mycobacterium avium-intracellulare) infection ([place]); Respiratory failure, acute ([place]); Healthcare maintenance; Allergic rhinitis; Pulmonary embolism ([place]); Recurrent non-small cell lung cancer ([place]); Neutropenia ([place]); COPD, moderate ([place]); Preoperative clearance; CAP (community acquired pneumonia); Tobacco  abuse; BPH (benign prostatic hyperplasia); GERD (gastroesophageal reflux disease); and SIRS (systemic inflammatory response syndrome) ([place]) on their problem list.  Significant Hospital Events: Including procedures, antibiotic start and stop dates in addition to other pertinent events   12/11 admit. 12/18 PCCM consult.  Interim History / Subjective:  Feels that he is breathing a little better last couple of days. Cough is better  Objective:  Blood pressure 117/77, pulse 93, temperature (!) 97.3 F (36.3 C), temperature source Oral, resp. rate (!) 22, height 5\' 6"  (1.676 m), weight 82.6 kg, SpO2 98 %.    FiO2 (%):  [40 %] 40 %   Intake/Output Summary (Last 24 hours) at 01/20/2022 1440 Last data filed at 01/20/2022 5102 Gross per 24 hour  Intake 772 ml  Output 2475 ml  Net -1703 ml    Filed Weights   01/12/22 0945  Weight: 82.6 kg    Examination: General: Adult man sitting up in bed, no distress able to speak in full sentences Neuro: Alert, interactive, nonfocal HEENT: No JVD, mild pallor, no icterus Cardiovascular: S1-S2 distant Lungs: NWOB, no accessory muscle use, barrel chest Abdomen: S1-S2 regular, no murmur Musculoskeletal: No edema, no deformity Skin: Mild bruising     Labs/imaging personally reviewed:   Chest x-ray 12/18 shows improved aeration on left suggesting improved pneumonia.  CT chest/abdomen/pelvis 12/6 shows stable radiation changes bilateral hilar, interstitial thickening and less infiltrate in the lingula left lower lobe  Assessment & Plan:   CAP with  acute on chronic hypoxic respiratory failure - at increased risk given his immunocompromised state. 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 Hx COPD. Prior history of immunotherapy mediated pneumonitis, [name] was stopped after cycle 9, months ago.  Most likely etiology of infiltrates  and decompensation is infection.  Immunotherapy  pneumonitis would not make sense given last dose of Keytruda months ago.  Possible concomitant volume overload while in the hospital accounting for worsening symptoms over time. -Appears improved since admission but remains O2 dependent, chest x-ray shows improving left lower lobe infiltrate - Continue cefepime (Day 5), plan 7 days total Psa coverage, if ready for d/c prior to completing cefepime can d/c on oral levofloxacin - Continue BD's, prednisone taper -Lasix ordered for the morning -Suspect he will need oxygen on discharge as he is needed in the past, please ensure he is ambulated prior to discharge and appropriate oxygen orders are placed if needed -Will arrange outpatient pulmonary follow-up  Hx stage III adenocarcinoma with recurrence and on maintenance chemo. - F/u with oncology.  Hx PE on Xarelto.  Hx Tobacco dependence. - Ongoing cessation counseling.  Best practice (evaluated daily):   Code Status:  full code Last date of multidisciplinary goals of care discussion: NA  Labs   CBC: Recent Labs  Lab 01/16/22 1231 01/18/22 0630 01/20/22 0645  WBC 6.1 14.3* 13.2*  NEUTROABS 4.8 12.1* 10.4*  HGB 13.2 11.7* 12.0*  HCT 41.0 37.1* 38.6*  MCV 97.2 98.1 99.5  PLT 270 319 317     Basic Metabolic Panel: Recent Labs  Lab 01/15/22 1139 01/16/22 1231 01/18/22 0630 01/20/22 0645  NA 137 138 140 138  K 4.2 4.1 4.4 4.6  CL 103 101 103 100  CO2 28 27 27 29   GLUCOSE 94 120* 114* 104*  BUN 15 15 24* 22  CREATININE 0.98 0.88 1.15 0.91  CALCIUM 8.4* 8.7* 8.9 8.6*    GFR: Estimated Creatinine Clearance: 80.5 mL/min (by C-G formula based on SCr of 0.91 mg/dL). Recent Labs  Lab 01/16/22 1231 01/16/22 1513 01/18/22 0630 01/20/22 0645  WBC 6.1  --  14.3* 13.2*  LATICACIDVEN 3.0* 2.2*  --   --      Liver Function Tests: Recent Labs  Lab 01/16/22 1231 01/20/22 0645  AST 30 26  ALT 25 41  ALKPHOS 105 82  BILITOT 0.6 0.3  PROT 7.4 6.7  ALBUMIN 2.9* 2.6*     No results for input(s): "LIPASE", "AMYLASE" in the last 168 hours. No results for input(s): "AMMONIA" in the last 168 hours.  ABG    Component Value Date/Time   HCO3 27.6 01/12/2022 0945   O2SAT 72.9 01/12/2022 0945     Coagulation Profile: No results for input(s): "INR", "PROTIME" in the last 168 hours.  Cardiac Enzymes: No results for input(s): "CKTOTAL", "CKMB", "CKMBINDEX", "TROPONINI" in the last 168 hours.  HbA1C: No results found for: "HGBA1C"  CBG: No results for input(s): "GLUCAP" in the last 168 hours.  Review of Systems:   Constitutional: negative for anorexia, fevers and sweats  Eyes: negative for irritation, redness and visual disturbance  Ears, nose, mouth, throat, and face: negative for earaches, epistaxis, nasal congestion and sore throat  Cardiovascular: negative for chest pain,lower extremity edema, orthopnea, palpitations and syncope  Gastrointestinal: negative for abdominal pain, constipation, diarrhea, melena, nausea and vomiting  Genitourinary:negative for dysuria, frequency and hematuria  Hematologic/lymphatic: negative for bleeding, easy bruising and lymphadenopathy  Musculoskeletal:negative for arthralgias, muscle weakness and stiff joints  Neurological: negative for coordination problems, gait problems, headaches and weakness  Endocrine: negative for diabetic symptoms including polydipsia, polyuria and weight loss   Past Medical History:  He,  has a past medical history of COPD (chronic obstructive pulmonary disease) ([place]), GERD (gastroesophageal reflux disease), Headache, Hyperlipidemia,  lung ca (dx'd 12/2018), Pneumonia, Pulmonary embolus ([place]), and Tobacco abuse.   Surgical History:   Past Surgical History:  Procedure Laterality Date   COLONOSCOPY     CYSTOSCOPY W/ URETERAL STENT PLACEMENT Right 10/24/2021   Procedure: CYSTOSCOPY WITH RETROGRADE PYELOGRAM/URETERAL STENT PLACEMENT;  Surgeon: Janith Lima, MD;  Location: [facility];  Service:  Urology;  Laterality: Right;   HAND SURGERY     VIDEO BRONCHOSCOPY WITH ENDOBRONCHIAL ULTRASOUND N/A 12/22/2018   Procedure: VIDEO BRONCHOSCOPY WITH ENDOBRONCHIAL ULTRASOUND;  Surgeon: Garner Nash, DO;  Location: [facility];  Service: Thoracic;  Laterality: N/A;   VIDEO BRONCHOSCOPY WITH ENDOBRONCHIAL ULTRASOUND N/A 01/09/2019   Procedure: VIDEO BRONCHOSCOPY WITH ENDOBRONCHIAL ULTRASOUND WITH FLUORO;  Surgeon: Garner Nash, DO;  Location: [facility];  Service: Thoracic;  Laterality: N/A;   VIDEO BRONCHOSCOPY WITH RADIAL ENDOBRONCHIAL ULTRASOUND N/A 12/22/2018   Procedure: RADIAL ENDOBRONCHIAL ULTRASOUND;  Surgeon: Garner Nash, DO;  Location: [facility];  Service: Thoracic;  Laterality: N/A;     Social History:   reports that he has been smoking cigarettes. He started smoking about 51 years ago. He has quit using smokeless tobacco.  His smokeless tobacco use included chew. He reports current alcohol use. He reports that he does not currently use drugs after having used the following drugs: Marijuana.   Family History:  His family history includes Cancer in his father; Stomach cancer in his father.   Allergies Allergies  Allergen Reactions   Carboplatin Anaphylaxis, Shortness Of Breath, Cough and Hypertension    On dose 8. Became short of breath. Symptom management was called. Received benadryl, solumedrol, Pepcid and fluids. Medication discontinued.    Klonopin [Clonazepam] Other (See Comments)    nervous   Norco [Hydrocodone-Acetaminophen] Other (See Comments)    Made pt feel jittery      Home Medications  Prior to Admission medications   Medication Sig Start Date End Date Taking? Authorizing Provider  acetaminophen (TYLENOL) 500 MG tablet Take 500-1,000 mg by mouth every 6 (six) hours as needed (pain.).   Yes [provider]  albuterol (VENTOLIN HFA) 108 (90 Base) MCG/ACT inhaler Inhale 2 puffs into the lungs every 4 (four) hours as needed for wheezing or shortness of breath.  01/05/22  Yes Glenice Ciccone, Bonna Gains, MD  Budeson-Glycopyrrol-Formoterol (BREZTRI AEROSPHERE) 160-9-4.8 MCG/ACT AERO Inhale 2 puffs into the lungs in the morning and at bedtime. 01/05/22  Yes Atley Neubert, Bonna Gains, MD  dextromethorphan-guaiFENesin [facility] DM) 30-600 MG 12hr tablet Take 1 tablet by mouth 2 (two) times daily as needed for cough. 03/17/20  Yes [name], MD  famotidine (PEPCID) 20 MG tablet TAKE 1 TABLET BY MOUTH TWICE A DAY Patient taking differently: Take 20 mg by mouth 2 (two) times daily. 02/04/21  Yes Heilingoetter, [name], [name]  folic acid (FOLVITE) 1 MG tablet TAKE 1 TABLET BY MOUTH EVERY DAY Patient taking differently: Take 1 mg by mouth every evening. 09/01/21  Yes [name], MD  Multiple Vitamins-Minerals (MULTI ADULT GUMMIES) CHEW Chew 1 tablet by mouth in the morning and at bedtime. 09/02/21  Yes [provider]  naphazoline-pheniramine (VISINE) 0.025-0.3 % ophthalmic solution Place 1 drop into both eyes daily as needed for eye irritation.   Yes [provider]  rivaroxaban (XARELTO) 20 MG TABS tablet Take 1 tablet (20 mg total) by mouth daily with supper. 03/12/21  Yes Gal Feldhaus, Bonna Gains, MD  tamsulosin (FLOMAX) 0.4 MG CAPS capsule Take 0.4 mg by mouth at bedtime. 10/31/21  Yes [provider]  Budeson-Glycopyrrol-Formoterol (BREZTRI AEROSPHERE) 160-9-4.8 MCG/ACT AERO Inhale 2 puffs into the lungs in the morning and at bedtime. 01/05/22   Boubacar Lerette, Bonna Gains, MD       Lanier Clam, MD [facility] See Amion for contact info If no response to pager , please call [phone] until 7 pm After 7:00 pm call Elink  [phone]   01/20/2022

## 2022-01-20 NOTE — Progress Notes (Signed)
Mobility Specialist - Progress Note   01/20/22 1043  Oxygen Therapy  O2 Device Nasal Cannula  O2 Flow Rate (L/min) 6 L/min  Mobility  Activity Ambulated independently in hallway  Level of Assistance Independent  Assistive Device None  Distance Ambulated (ft) 500 ft  Activity Response Tolerated well  Mobility Referral Yes  $Mobility charge 1 Mobility   Pt received in bed and agreeable to mobility. Pt dropped to 74% in the beginning of ambulation. MD in hallway at the time & requested for O2 tank to be put on 6L even though he's on 4L in the room. O2 back up to 88%-90%. Pt required 2x standing pursed lip breathing rest breaks. Pt had SOB throughout entire ambulation.  Pt to bed after session with all needs met.   Pre-mobility: 107 HR, 88%SpO2 (4L) During mobility: 118 HR, 90% SpO2 (6L) Post-mobility: 110 HR, 90% SPO2  Set designer

## 2022-01-20 NOTE — Progress Notes (Signed)
[facility]                                                                               Philip [name], is a 66 y.o. male, DOB - [birth year], FTD:[id number] Admit date - 01/12/2022    Outpatient Primary MD for the patient is [name], MD  LOS - 8  days    Brief summary    Philip [name] is a 66 y.o. male with medical history significant of adenocarcinoma of the lung, COPD, BPH, tobacco abuse, PE on xarelto. Presenting with shortness of breath. He was admitted for sepsis from CAP. He has completed 5 days of IV rocephin and Zithromax. Despite IV antibiotics patient remained sob, requiring up to 4 lit of [place] oxygen at rest and 6 lit of [place] oxygen on ambulation. PCCM consulted for persistent hypoxia   Assessment & Plan    Assessment and Plan:  Acute on chronic respiratory failure secondary to CAP vs immunotherapy mediated pneumonitis.  Sepsis secondary to pneumonia in the left lower lobe,  completed 5 days of IV rocephin but his oxygen demands continued to increase, currently requiring up to  4 lit/min of oxygen at rest and 6 lit/min during ambulation.  Still very dyspneic on ambulation.  CXR shows worsening left lower lobe opacity. Suspect post obstructive pneumonia vs immunotherapy mediated pneumonitis.   Changed antibiotics to  vancomycin and cefepime. One dose of vancomycin given, MRSA screen is negative. Continue with cefepime for now.   Wbc count wnl. Absolute neutrophil count is wnl.  COVID / flu/ RVP negative.  Sepsis physiology appears to have resolved.  Continue with duo nebs and guaifenesin and Tessalon. MRSA PCR screen ordered and negative.  Requested PCCM consult for further evaluation.  Suspect he will need oxygen for discharge.      Acute COPD exacerbation: acute on chronic respiratory failure.  No wheezing today. .  Started on IV solumedrol transition to quick steroid taper.  Continue with duonebs. Remains on 4 lit/min HF .   Recommend  ambulation.     Recurrent  non small cell lung cancer , adenocarcinoma with recurrence.  Completed 6 cycles of chemo radiation, followed by consolidative immunotherapy with disease recurrence in 10/22 Follows up with Dr Julien Nordmann.    Pulmonary embolism:  Continue with Xarelto.    GERD;  Continue with PPI.    Tobacco abuse ; Counseled against further use.   Elevated lactic acid : Unclear etiology.    Estimated body mass index is 29.38 kg/m as calculated from the following:   Height as of this encounter: 5\' 6"  (1.676 m).   Weight as of this encounter: 82.6 kg.  Code Status: full code.  DVT Prophylaxis:   rivaroxaban (XARELTO) tablet 20 mg   Level of Care: Level of care: Telemetry Family Communication: none at bedside.   Disposition Plan:     Remains inpatient appropriate: IV antibiotics.   Procedures:  None.  Consultants:   Oncology.   Antimicrobials:   Anti-infectives (From admission, onward)    Start     Dose/Rate Route Frequency Ordered Stop   01/19/22 2200  vancomycin (VANCOREADY) IVPB 750 mg/150 mL  Status:  Discontinued  750 mg 150 mL/hr over 60 Minutes Intravenous Every 12 hours 01/19/22 1005 01/19/22 1616   01/16/22 2000  vancomycin (VANCOCIN) IVPB 1000 mg/200 mL premix  Status:  Discontinued        1,000 mg 200 mL/hr over 60 Minutes Intravenous Every 12 hours 01/16/22 1906 01/19/22 1005   01/16/22 1200  ceFEPIme (MAXIPIME) 2 g in sodium chloride 0.9 % 100 mL IVPB        2 g 200 mL/hr over 30 Minutes Intravenous Every 8 hours 01/16/22 1154     01/16/22 1000  azithromycin (ZITHROMAX) tablet 500 mg        500 mg Oral  Once 01/15/22 1108 01/16/22 0919   01/13/22 1000  azithromycin (ZITHROMAX) 500 mg in sodium chloride 0.9 % 250 mL IVPB  Status:  Discontinued        500 mg 250 mL/hr over 60 Minutes Intravenous Every 24 hours 01/12/22 1532 01/15/22 1108   01/12/22 2200  cefTRIAXone (ROCEPHIN) 2 g in sodium chloride 0.9 % 100 mL IVPB  Status:   Discontinued        2 g 200 mL/hr over 30 Minutes Intravenous Every 24 hours 01/12/22 1532 01/16/22 1145   01/12/22 1015  ceFEPIme (MAXIPIME) 2 g in sodium chloride 0.9 % 100 mL IVPB        2 g 200 mL/hr over 30 Minutes Intravenous  Once 01/12/22 1004 01/12/22 1109   01/12/22 1015  azithromycin (ZITHROMAX) 500 mg in sodium chloride 0.9 % 250 mL IVPB        500 mg 250 mL/hr over 60 Minutes Intravenous  Once 01/12/22 1004 01/12/22 1241        Medications  Scheduled Meds:  calcium carbonate  1 tablet Oral TID WC   famotidine  20 mg Oral BID   fluticasone  1 spray Each Nare Daily   folic acid  1 mg Oral QPM   guaiFENesin  1,200 mg Oral BID   ipratropium-albuterol  3 mL Nebulization Q6H   loratadine  10 mg Oral Daily   midodrine  5 mg Oral TID WC   pantoprazole  40 mg Oral Daily   predniSONE  40 mg Oral QAC breakfast   rivaroxaban  20 mg Oral Q supper   tamsulosin  0.4 mg Oral QHS   Continuous Infusions:  ceFEPime (MAXIPIME) IV 2 g (01/20/22 0338)   PRN Meds:.acetaminophen **OR** acetaminophen, alum & mag hydroxide-simeth, benzonatate, naphazoline-pheniramine, prochlorperazine    Subjective:   Philip [name] was seen and examined today.  No new complaints.   Objective:   Vitals:   01/19/22 1443 01/19/22 1927 01/19/22 2003 01/20/22 0811  BP:  107/83    Pulse:  (!) 104    Resp:  17    Temp:  97.6 F (36.4 C)    TempSrc:  Oral    SpO2: 95% 98% 98% 96%  Weight:      Height:        Intake/Output Summary (Last 24 hours) at 01/20/2022 1249 Last data filed at 01/20/2022 0735 Gross per 24 hour  Intake 1245 ml  Output 2475 ml  Net -1230 ml    Filed Weights   01/12/22 0945  Weight: 82.6 kg     Exam General exam: ill appearing gentleman , very dyspneic on ambulation.  Respiratory system: air entry fair, no wheezing on 4l it of [place] oxygen.  Cardiovascular system: S1 & S2 heard, RRR. No JVD,  Gastrointestinal system: Abdomen is nondistended, soft and  nontender.Normal bowel  sounds heard. Central nervous system: Alert and oriented. No focal neurological deficits. Extremities: Symmetric 5 x 5 power. Skin: No rashes, lesions or ulcers Psychiatry:  Mood & affect appropriate.           Data Reviewed:  I have personally reviewed following labs and imaging studies   CBC Lab Results  Component Value Date   WBC 13.2 (H) 01/20/2022   RBC 3.88 (L) 01/20/2022   HGB 12.0 (L) 01/20/2022   HCT 38.6 (L) 01/20/2022   MCV 99.5 01/20/2022   MCH 30.9 01/20/2022   PLT 317 01/20/2022   MCHC 31.1 01/20/2022   RDW 14.3 01/20/2022   LYMPHSABS 1.0 01/20/2022   MONOABS 1.3 (H) 01/20/2022   EOSABS 0.0 01/20/2022   BASOSABS 0.1 82/64/1583     Last metabolic panel Lab Results  Component Value Date   NA 138 01/20/2022   K 4.6 01/20/2022   CL 100 01/20/2022   CO2 29 01/20/2022   BUN 22 01/20/2022   CREATININE 0.91 01/20/2022   GLUCOSE 104 (H) 01/20/2022   GFRNONAA >60 01/20/2022   GFRAA >60 11/02/2019   CALCIUM 8.6 (L) 01/20/2022   PROT 6.7 01/20/2022   ALBUMIN 2.6 (L) 01/20/2022   BILITOT 0.3 01/20/2022   ALKPHOS 82 01/20/2022   AST 26 01/20/2022   ALT 41 01/20/2022   ANIONGAP 9 01/20/2022    CBG (last 3)  No results for input(s): "GLUCAP" in the last 72 hours.    Coagulation Profile: No results for input(s): "INR", "PROTIME" in the last 168 hours.    Radiology Studies: DG CHEST PORT 1 VIEW  Result Date: 01/19/2022 CLINICAL DATA:  Follow-up pneumonia. EXAM: PORTABLE CHEST 1 VIEW COMPARISON:  January 16, 2022. FINDINGS: Stable cardiomediastinal silhouette. Left lung opacity noted on prior exam is slightly decreased compared to prior exam, suggesting improving pneumonia or edema. Mildly elevated right hemidiaphragm is noted with probable right upper lobe subsegmental atelectasis or scarring. Bony thorax is unremarkable. IMPRESSION: Decreased left lung opacity is noted suggesting improving pneumonia or asymmetric edema.  Electronically Signed   By: Marijo Conception M.D.   On: 01/19/2022 10:10       Hosie Poisson M.D. [facility] 01/20/2022, 12:49 PM  Available via Epic secure chat 7am-7pm After 7 pm, please refer to night coverage provider listed on amion.

## 2022-01-21 DIAGNOSIS — C3492 Malignant neoplasm of unspecified part of left bronchus or lung: Secondary | ICD-10-CM

## 2022-01-21 DIAGNOSIS — J189 Pneumonia, unspecified organism: Secondary | ICD-10-CM | POA: Diagnosis not present

## 2022-01-21 DIAGNOSIS — J9601 Acute respiratory failure with hypoxia: Secondary | ICD-10-CM | POA: Diagnosis not present

## 2022-01-21 MED ORDER — IPRATROPIUM-ALBUTEROL 0.5-2.5 (3) MG/3ML IN SOLN
3.0000 mL | Freq: Three times a day (TID) | RESPIRATORY_TRACT | Status: DC
  Administered 2022-01-21 – 2022-01-22 (×4): 3 mL via RESPIRATORY_TRACT
  Filled 2022-01-21 (×4): qty 3

## 2022-01-21 MED ORDER — ALBUTEROL SULFATE (2.5 MG/3ML) 0.083% IN NEBU: 2.5000 mg | INHALATION_SOLUTION | RESPIRATORY_TRACT | Status: DC | PRN

## 2022-01-21 NOTE — Progress Notes (Signed)
Mobility Specialist - Progress Note  Pre-mobility: 105 bpm HR, 91% SpO2 During mobility: 115 bpm HR, 85% SpO2 Post-mobility: 105 bpm HR, 91% SPO2   01/21/22 1144  Oxygen Therapy  O2 Device Nasal Cannula  O2 Flow Rate (L/min) 4 L/min  Mobility  Activity Ambulated independently in hallway  Level of Assistance Modified independent, requires aide device or extra time  Assistive Device None  Distance Ambulated (ft) 400 ft  Range of Motion/Exercises Active  Activity Response Tolerated well  Mobility Referral Yes  $Mobility charge 1 Mobility   Pt was found in bed and agreeable to ambulate. Stated feeling a little lightheaded when ambulating. SPO2 went down to 86% during ambulation but with x1 standing rest break for pursed lip breathing he was able to increase SPO2 to 90%. Wanted to ambulate further but advised to return to room due to drop in SPO2. Once back in room SPO2 had decreased to 85% but he was able to increase to 91% within 2 min. At EOS returned to bed with necessities in reach and NT in room.  [name] Mobility Specialist

## 2022-01-21 NOTE — Progress Notes (Signed)
[facility] Progress Note  Patient: Philip [name]     VXB:[id number]  DOA: 01/12/2022   PCP: [name], MD       Brief hospital course: This is a 66 year old male with adenocarcinoma of the lung, COPD, tobacco abuse, PE and BPH who presented to the hospital for shortness of breath.  He states that he has been short of breath for 2 years ever since "they punctured my lung" while doing a lung biopsy.  He had increasing shortness of breath just prior to coming to the hospital. In the ED he was found to have a pulse ox in the 60s.  He admitted to having a productive cough. Chest x-ray revealed bilateral interstitial opacities and patchy left lung opacities. He was started on ceftriaxone and azithromycin to treat community-acquired pneumonia. On 12/19 the pulmonary team was consulted.  Antibiotics were changed to vancomycin and cefepime.  Subjective:  He feels very short of breath even on ambulating to the bathroom  Assessment and Plan: Principal Problem:   CAP (community acquired pneumonia) versus postobstructive pneumonia from lung cancer, acute respiratory failure with hypoxia COPD and tobacco abuse -Appreciate pulmonary eval-recommendations are to continue cefepime for total of 7 days before transitioning to levofloxacin, prednisone taper and then outpatient follow-up - He will likely require oxygen when he is discharged from the hospital and he is in agreement with this  Active Problems:   Adenocarcinoma of right lung, stage 3-recurrent non-small cell lung cancer -Status post chemoradiation in 2020, immunotherapy in 2021 with disease recurrence since October 2022 -he was receiving Alimta as outpatient every 3 weeks and will follow-up after Christmas to receive his next cycle    Pulmonary embolism ([place]) -Continue Xarelto  Hypotension - Continue midodrine which was started on 12/15  BPH - Continue tamsulosin      Code Status: Full Code Consultants:  Pulmonary Level of Care: Level of care: Telemetry Total time on patient care: 35 minutes DVT prophylaxis: Xarelto  Objective:   Vitals:   01/20/22 2344 01/21/22 0447 01/21/22 0625 01/21/22 0818  BP: 109/78 105/72  97/66  Pulse: 97 86  92  Resp: (!) 22 16  20   Temp: (!) 97.4 F (36.3 C) 97.7 F (36.5 C)    TempSrc: Oral Oral    SpO2: 96% 97% 96% 95%  Weight:      Height:       Filed Weights   01/12/22 0945  Weight: 82.6 kg   Exam: General exam: Appears comfortable  HEENT: oral mucosa moist Respiratory system: Mild rhonchi bilaterally Cardiovascular system: S1 & S2 heard  Gastrointestinal system: Abdomen soft, non-tender, nondistended. Normal bowel sounds   Extremities: No cyanosis, clubbing or edema Psychiatry:  Mood & affect appropriate.      CBC: Recent Labs  Lab 01/16/22 1231 01/18/22 0630 01/20/22 0645  WBC 6.1 14.3* 13.2*  NEUTROABS 4.8 12.1* 10.4*  HGB 13.2 11.7* 12.0*  HCT 41.0 37.1* 38.6*  MCV 97.2 98.1 99.5  PLT 270 319 330   Basic Metabolic Panel: Recent Labs  Lab 01/15/22 1139 01/16/22 1231 01/18/22 0630 01/20/22 0645  NA 137 138 140 138  K 4.2 4.1 4.4 4.6  CL 103 101 103 100  CO2 28 27 27 29   GLUCOSE 94 120* 114* 104*  BUN 15 15 24* 22  CREATININE 0.98 0.88 1.15 0.91  CALCIUM 8.4* 8.7* 8.9 8.6*   GFR: Estimated Creatinine Clearance: 80.5 mL/min (by C-G formula based on SCr of 0.91 mg/dL).  Scheduled Meds:  calcium carbonate  1 tablet Oral TID WC   famotidine  20 mg Oral BID   fluticasone  1 spray Each Nare Daily   folic acid  1 mg Oral QPM   guaiFENesin  1,200 mg Oral BID   ipratropium-albuterol  3 mL Nebulization TID   loratadine  10 mg Oral Daily   midodrine  5 mg Oral TID WC   pantoprazole  40 mg Oral Daily   predniSONE  40 mg Oral QAC breakfast   rivaroxaban  20 mg Oral Q supper   tamsulosin  0.4 mg Oral QHS   Continuous Infusions:  ceFEPime (MAXIPIME) IV 2 g (01/21/22 0315)   Imaging and lab data was personally  reviewed No results found.  LOS: 9 days   Author: Debbe Odea  01/21/2022 12:23 PM  To contact [facility]>   Check the care team in [facility] and look for the attending/consulting [place] provider listed  Log into [email or web address] and use [place]'s universal password   Go to> "[facility]"  and find provider  If you still have difficulty reaching the provider, please page the [facility] (Director on Call) for the Hospitalists listed on amion

## 2022-01-21 NOTE — Plan of Care (Signed)

## 2022-01-21 NOTE — Plan of Care (Signed)
  Problem: Respiratory: Goal: Ability to maintain a clear airway will improve Outcome: Progressing   Problem: Nutrition: Goal: Adequate nutrition will be maintained Outcome: Progressing   Problem: Pain Managment: Goal: General experience of comfort will improve Outcome: Progressing

## 2022-01-21 NOTE — Progress Notes (Signed)
  Daily Progress Note   Patient Name: Philip [name]       Date: 01/21/2022 DOB: 01-Dec-1955  Age: 66 y.o. MRN#: [id number] Attending Physician: Debbe Odea, MD Primary Care Physician: [name], MD Admit Date: 01/12/2022 Length of Stay: 9 days  Discussed care with primary hospitalist today.  Received new palliative medicine consult on 01/20/2022.  Upon EMR review and discussions with hospitalist, patient's current goals for medical care have been determined.  Patient hoping to discharge after antibiotic therapy for CAP and continue follow-up with his oncologist as an outpatient.  Will place referral for patient to be seen by outpatient palliative medicine  provider at [facility] assist with management of palliative care needs.  At this time no acute needs for inpatient palliative medicine team.  Will discontinue inpatient consult at this time though place referral for outpatient.  Please reach out if we can be of further assistance during patient's medical journey.  Chelsea Aus, DO Palliative Care Provider PMT # [phone]

## 2022-01-22 DIAGNOSIS — J449 Chronic obstructive pulmonary disease, unspecified: Secondary | ICD-10-CM | POA: Diagnosis not present

## 2022-01-22 DIAGNOSIS — J189 Pneumonia, unspecified organism: Secondary | ICD-10-CM | POA: Diagnosis not present

## 2022-01-22 DIAGNOSIS — C3490 Malignant neoplasm of unspecified part of unspecified bronchus or lung: Secondary | ICD-10-CM | POA: Diagnosis not present

## 2022-01-22 MED ORDER — PREDNISONE 10 MG PO TABS: ORAL_TABLET | ORAL | 0 refills | Status: DC

## 2022-01-22 MED ORDER — LEVOFLOXACIN 750 MG PO TABS: 750.0000 mg | ORAL_TABLET | Freq: Every day | ORAL | 0 refills | Status: AC

## 2022-01-22 MED ORDER — IPRATROPIUM-ALBUTEROL 0.5-2.5 (3) MG/3ML IN SOLN: 3.0000 mL | RESPIRATORY_TRACT | 1 refills | Status: DC | PRN

## 2022-01-22 MED ORDER — PREDNISONE 10 MG PO TABS: ORAL_TABLET | ORAL | 0 refills | Status: AC

## 2022-01-22 MED ORDER — MIDODRINE HCL 5 MG PO TABS: 5.0000 mg | ORAL_TABLET | Freq: Three times a day (TID) | ORAL | 0 refills | Status: AC

## 2022-01-22 MED ORDER — PANTOPRAZOLE SODIUM 40 MG PO TBEC: 40.0000 mg | DELAYED_RELEASE_TABLET | Freq: Every day | ORAL | 1 refills | Status: DC

## 2022-01-22 MED ORDER — ALBUTEROL SULFATE (2.5 MG/3ML) 0.083% IN NEBU: 2.5000 mg | INHALATION_SOLUTION | RESPIRATORY_TRACT | 2 refills | Status: DC | PRN

## 2022-01-22 MED ORDER — GUAIFENESIN ER 600 MG PO TB12: 1200.0000 mg | ORAL_TABLET | Freq: Two times a day (BID) | ORAL | 0 refills | Status: AC | PRN

## 2022-01-22 NOTE — Discharge Summary (Signed)
Physician Discharge Summary  Philip [name] OXB:[id number] DOB: 10/21/1955 DOA: 01/12/2022  PCP: [name], MD  Admit date: 01/12/2022 Discharge date: 01/22/2022 Discharging to: home Recommendations for Outpatient Follow-up:  F/u on ambulatory pulse ox readings  Consults:  PCCM Oncology Procedures:  none   Discharge Diagnoses:   Principal Problem:   CAP (community acquired pneumonia) Active Problems:   Adenocarcinoma of right lung, stage 3 (HCC)   Respiratory failure, acute ([place])   Pulmonary embolism (HCC)   COPD, moderate ([place])   Tobacco abuse   BPH (benign prostatic hyperplasia)   GERD (gastroesophageal reflux disease)     Hospital Course:  This is a 66 year old male with adenocarcinoma of the lung, COPD, tobacco abuse, PE and BPH who presented to the hospital for shortness of breath.  He states that he has been short of breath for 2 years ever since "they punctured my lung" while doing a lung biopsy.  He had increasing shortness of breath just prior to coming to the hospital. In the ED he was found to have a pulse ox in the 60s.  He admitted to having a productive cough. Chest x-ray revealed bilateral interstitial opacities and patchy left lung opacities. He was started on ceftriaxone and azithromycin to treat community-acquired pneumonia. On 12/19 the pulmonary team was consulted.  Antibiotics were changed to vancomycin and cefepime.  Principal Problem:   CAP (community acquired pneumonia) versus postobstructive pneumonia from lung cancer, acute respiratory failure with hypoxia COPD and tobacco abuse -Appreciate pulmonary eval-recommendations are to continue cefepime for total of 7 days before transitioning to levofloxacin, prednisone taper and then outpatient follow-up - I have given him a 7 day script for Levofloxacin and a scrip for a Neb machine - He will require oxygen when he is discharged from the hospital     Active Problems:   Adenocarcinoma of right  lung, stage 3-recurrent non-small cell lung cancer -Status post chemoradiation in 2020, immunotherapy in 2021 with disease recurrence since October 2022 -he was receiving Alimta as outpatient every 3 weeks and will follow-up after Christmas to receive his next cycle     Pulmonary embolism (HCC) -Continue Xarelto   Hypotension - Continue midodrine which was started on 12/15   BPH - Continue tamsulosin          Discharge Instructions  Discharge Instructions     Amb Referral to Palliative Care   Complete by: As directed    For home use only DME Nebulizer machine   Complete by: As directed    Patient needs a nebulizer to treat with the following condition: COPD exacerbation ([place])   Length of Need: Lifetime   Increase activity slowly   Complete by: As directed       Allergies as of 01/22/2022       Reactions   Carboplatin Anaphylaxis, Shortness Of Breath, Cough, Hypertension   On dose 8. Became short of breath. Symptom management was called. Received benadryl, solumedrol, Pepcid and fluids. Medication discontinued.    Klonopin [clonazepam] Other (See Comments)   nervous   Norco [hydrocodone-acetaminophen] Other (See Comments)   Made pt feel jittery         Medication List     TAKE these medications    acetaminophen 500 MG tablet Commonly known as: TYLENOL Take 500-1,000 mg by mouth every 6 (six) hours as needed (pain.).   albuterol 108 (90 Base) MCG/ACT inhaler Commonly known as: VENTOLIN HFA Inhale 2 puffs into the lungs every 4 (four) hours as needed  for wheezing or shortness of breath. What changed: Another medication with the same name was added. Make sure you understand how and when to take each.   albuterol (2.5 MG/3ML) 0.083% nebulizer solution Commonly known as: PROVENTIL Take 3 mLs (2.5 mg total) by nebulization every 4 (four) hours as needed for wheezing or shortness of breath. What changed: You were already taking a medication with the same name,  and this prescription was added. Make sure you understand how and when to take each.   Breztri Aerosphere 160-9-4.8 MCG/ACT Aero Generic drug: Budeson-Glycopyrrol-Formoterol Inhale 2 puffs into the lungs in the morning and at bedtime. What changed: Another medication with the same name was removed. Continue taking this medication, and follow the directions you see here.   dextromethorphan-guaiFENesin 30-600 MG 12hr tablet Commonly known as: MUCINEX DM Take 1 tablet by mouth 2 (two) times daily as needed for cough.   famotidine 20 MG tablet Commonly known as: PEPCID TAKE 1 TABLET BY MOUTH TWICE A DAY   folic acid 1 MG tablet Commonly known as: FOLVITE TAKE 1 TABLET BY MOUTH EVERY DAY What changed: when to take this   guaiFENesin 600 MG 12 hr tablet Commonly known as: MUCINEX Take 2 tablets (1,200 mg total) by mouth 2 (two) times daily as needed.   ipratropium-albuterol 0.5-2.5 (3) MG/3ML Soln Commonly known as: DUONEB Take 3 mLs by nebulization every 4 (four) hours as needed.   levofloxacin 750 MG tablet Commonly known as: Levaquin Take 1 tablet (750 mg total) by mouth daily for 7 days. Start taking on: January 23, 2022   midodrine 5 MG tablet Commonly known as: PROAMATINE Take 1 tablet (5 mg total) by mouth 3 (three) times daily with meals.   Multi Adult Gummies Chew Chew 1 tablet by mouth in the morning and at bedtime.   pantoprazole 40 MG tablet Commonly known as: PROTONIX Take 1 tablet (40 mg total) by mouth daily. Start taking on: January 23, 2022   predniSONE 10 MG tablet Commonly known as: DELTASONE Take 4 tablets (40 mg total) by mouth daily at 12 noon for 2 days, THEN 2 tablets (20 mg total) daily at 12 noon for 2 days, THEN 1 tablet (10 mg total) daily at 12 noon for 2 days, THEN 0.5 tablets (5 mg total) daily at 12 noon for 2 days. 60 mg tomorrow, taper by 10 mg daily until complete. Start taking on: January 22, 2022   rivaroxaban 20 MG Tabs  tablet Commonly known as: Xarelto Take 1 tablet (20 mg total) by mouth daily with supper.   tamsulosin 0.4 MG Caps capsule Commonly known as: FLOMAX Take 0.4 mg by mouth at bedtime.   Visine 0.025-0.3 % ophthalmic solution Generic drug: naphazoline-pheniramine Place 1 drop into both eyes daily as needed for eye irritation.               Durable Medical Equipment  (From admission, onward)           Start     Ordered   01/22/22 0000  For home use only DME Nebulizer machine       Question Answer Comment  Patient needs a nebulizer to treat with the following condition COPD exacerbation ([place])   Length of Need Lifetime      01/22/22 1235   01/20/22 1108  For home use only DME oxygen  Once       Question Answer Comment  Length of Need Lifetime   Mode or (Route) Nasal cannula  Liters per Minute 4   Frequency Continuous (stationary and portable oxygen unit needed)   Oxygen conserving device Yes   Oxygen delivery system Gas      01/20/22 1107                The results of significant diagnostics from this hospitalization (including imaging, microbiology, ancillary and laboratory) are listed below for reference.    DG CHEST PORT 1 VIEW  Result Date: 01/19/2022 CLINICAL DATA:  Follow-up pneumonia. EXAM: PORTABLE CHEST 1 VIEW COMPARISON:  January 16, 2022. FINDINGS: Stable cardiomediastinal silhouette. Left lung opacity noted on prior exam is slightly decreased compared to prior exam, suggesting improving pneumonia or edema. Mildly elevated right hemidiaphragm is noted with probable right upper lobe subsegmental atelectasis or scarring. Bony thorax is unremarkable. IMPRESSION: Decreased left lung opacity is noted suggesting improving pneumonia or asymmetric edema. Electronically Signed   By: Marijo Conception M.D.   On: 01/19/2022 10:10   DG CHEST PORT 1 VIEW  Result Date: 01/16/2022 CLINICAL DATA:  COPD.  Dyspnea. EXAM: PORTABLE CHEST 1 VIEW COMPARISON:  Chest  radiographs 01/12/2022 and 10/23/2021; CT chest 01/07/2022 FINDINGS: Cardiac silhouette is again mildly enlarged. Moderately decreased lung volumes are similar to prior. Chronic superior right lung scarring is similar to multiple prior radiographs and CT. Moderate left lower lung interstitial thickening is seen on prior remote baseline radiographs including 10/23/2021 and there is left mid and lower lung heterogeneous airspace opacification that is slightly increased from 01/12/2022 and new from 10/23/2021. No definite pleural effusion. No pneumothorax. Mild-to-moderate multilevel degenerative disc changes of the thoracic spine. IMPRESSION: 1. Moderate left lower lung heterogeneous airspace opacification is slightly increased from 01/12/2022 and new from 10/23/2021. This may represent pneumonia or asymmetric pulmonary edema. 2. Chronic superior right lung linear scarring is similar to multiple prior radiographs and CT. Electronically Signed   By: Yvonne Kendall M.D.   On: 01/16/2022 10:06   DG Chest 2 View  Result Date: 01/12/2022 CLINICAL DATA:  Patient with history of lung cancer with Two-week history of shortness of breath EXAM: CHEST - 2 VIEW COMPARISON:  Chest radiograph dated 10/23/2021, CT chest dated 01/07/2022 FINDINGS: Low lung volumes with similar asymmetric elevation of the right hemidiaphragm. Similar appearance of perihilar consolidations. Increased bilateral interstitial and patchy left lung opacities. No pleural effusion or pneumothorax. The heart size and mediastinal contours are within normal limits. The visualized skeletal structures are unremarkable. Partially imaged catheter projects over the right lower hemiabdomen. IMPRESSION: 1. Increased bilateral interstitial and patchy left lung opacities, which could represent pulmonary edema or infection. 2. Similar appearance of perihilar consolidations, better evaluated as postradiation changes on prior chest CT. Electronically Signed   By: Darrin Nipper M.D.   On: 01/12/2022 08:49   CT CHEST ABDOMEN PELVIS W CONTRAST  Result Date: 01/09/2022 CLINICAL DATA:  Bronchogenic carcinoma. Patient status post chemo radiation therapy. Restaging exam. * Tracking Code: BO * EXAM: CT CHEST, ABDOMEN, AND PELVIS WITH CONTRAST TECHNIQUE: Multidetector CT imaging of the chest, abdomen and pelvis was performed following the standard protocol during bolus administration of intravenous contrast. RADIATION DOSE REDUCTION: This exam was performed according to the departmental dose-optimization program which includes automated exposure control, adjustment of the mA and/or kV according to patient size and/or use of iterative reconstruction technique. CONTRAST:  25mL OMNIPAQUE IOHEXOL 300 MG/ML  SOLN FINDINGS: CT CHEST FINDINGS Cardiovascular: No significant vascular findings. Normal heart size. No pericardial effusion. Mediastinum/Nodes: Small prevascular lymph nodes are unchanged. New  mediastinal hilar adenopathy. No supraclavicular adenopathy. No axillary adenopathy. Lungs/Pleura: Perihilar interstitial thickening and cystic change typical of radiation sequela noted about both the LEFT and RIGHT hilum. Findings are unchanged from comparison exam. Within the LEFT lower lobe there is new subtle superimposed ill-defined ground-glass nodularity. For example 6 mm nodule on image 81/4. Peribronchial nodule on image 86/4. Mild increase interstitial thickening similar peribronchial ground-glass density and interstitial thickening in the lingula (image 79/4. Musculoskeletal: No aggressive osseous lesion. CT ABDOMEN AND PELVIS FINDINGS Hepatobiliary: No focal hepatic lesion. No biliary ductal dilatation. Gallbladder is normal. Common bile duct is normal. Pancreas: Pancreas is normal. No ductal dilatation. No pancreatic inflammation. Spleen: No change in benign LEFT adrenal adenoma measuring 26 mm. Smaller adenoma of the of the RIGHT adrenal gland. Kidneys normal. Double-J ureteral stent  within the RIGHT renal pelvis extending to the bladder. No RIGHT hydronephrosis. Adrenals/urinary tract: Adrenal glands and kidneys are normal. The ureters and bladder normal. Stomach/Bowel: Stomach, small bowel, appendix, and cecum are normal. The colon and rectosigmoid colon are normal. Vascular/Lymphatic: Abdominal aorta is normal caliber. There is no retroperitoneal or periportal lymphadenopathy. No pelvic lymphadenopathy. Reproductive: Prostate unremarkable Other: No free fluid. Musculoskeletal: No aggressive osseous lesion. IMPRESSION: Chest Impression: 1. No evidence of lung cancer recurrence. 2. Stable postradiation change within the LEFT and RIGHT lung about the hila. 3. New interstitial thickening and ill-defined ground-glass densities/nodules lingula and lower lobe. Findings suggest pulmonary infection versus continue radiation change versus unlikely malignancy. Recommend clinical correlation for pulmonary infection and attention on follow-up. 4. Small mediastinal lymph nodes are unchanged. Abdomen / Pelvis Impression: 1. No evidence lung cancer metastasis in the abdomen pelvis. 2. Interval placement RIGHT double-J ureteral stent. No hydronephrosis. 3. No skeletal metastasis. Electronically Signed   By: Suzy Bouchard M.D.   On: 01/09/2022 10:06   Labs:   Basic Metabolic Panel: Recent Labs  Lab 01/16/22 1231 01/18/22 0630 01/20/22 0645  NA 138 140 138  K 4.1 4.4 4.6  CL 101 103 100  CO2 27 27 29   GLUCOSE 120* 114* 104*  BUN 15 24* 22  CREATININE 0.88 1.15 0.91  CALCIUM 8.7* 8.9 8.6*     CBC: Recent Labs  Lab 01/16/22 1231 01/18/22 0630 01/20/22 0645  WBC 6.1 14.3* 13.2*  NEUTROABS 4.8 12.1* 10.4*  HGB 13.2 11.7* 12.0*  HCT 41.0 37.1* 38.6*  MCV 97.2 98.1 99.5  PLT 270 319 317         SIGNED:   Debbe Odea, MD  [facility] 01/22/2022, 1:50 PM

## 2022-01-22 NOTE — Progress Notes (Signed)
Mobility Specialist - Progress Note  ([place] 3L) Pre-mobility: 95% SpO2 During mobility: 82% SpO2 Post-mobility: 90% SPO2   01/22/22 1126  Mobility  Activity Ambulated independently to bathroom;Ambulated independently in hallway  Level of Assistance Modified independent, requires aide device or extra time  Assistive Device None  Distance Ambulated (ft) 350 ft  Range of Motion/Exercises Active  Activity Response Tolerated well  Mobility Referral Yes  $Mobility charge 1 Mobility   Pt was found in bed and agreeable to ambulate. Took x2 standing rest breaks for ~ 1 min ea. to practice pursed lipped breathing during ambulation. At EOS returned to sit on recliner chair and it took ~ 3 min to bring SPO2 up to 90% from 82%. Was left with all necessities in reach and RN notified of session.

## 2022-01-22 NOTE — TOC Transition Note (Signed)
Transition of Care [facility]) - CM/SW Discharge Note   Patient Details  Name: Philip [name] MRN: [id number] Date of Birth: 1955/04/01  Transition of Care [facility]) CM/SW Contact:  Vassie Moselle, LCSW Phone Number: 01/22/2022, 11:05 AM   Clinical Narrative:    Pt is to return home with O2. O2 has been ordered through [facility]. New sats note will need to be completed prior to pt being discharged. RN notified.     Final next level of care: Home/Self Care Barriers to Discharge: No Barriers Identified   Patient Goals and CMS Choice Patient states their goals for this hospitalization and ongoing recovery are:: To go home CMS Medicare.gov Compare Post Acute Care list provided to:: Patient Choice offered to / list presented to : Patient    Discharge Placement                       Discharge Plan and Services                DME Arranged: Oxygen DME Agency: [facility] Date DME Agency Contacted: 01/14/22 Time DME Agency Contacted: 1204 Representative spoke with at DME Agency: [place] Determinants of Health ([place]) Interventions Housing Interventions: Intervention Not Indicated   Readmission Risk Interventions    01/22/2022   11:05 AM 01/14/2022   12:03 PM  Readmission Risk Prevention Plan  Transportation Screening Complete Complete  PCP or Specialist Appt within 5-7 Days Complete Complete  Home Care Screening Complete Complete  Medication Review (RN CM) Complete Complete

## 2022-01-22 NOTE — Plan of Care (Signed)
Received patient alert and oriented x 4, on 5L nasal cannula, HR on low 100s. Denies pain. Reports SOB wwith ambulation. Titrated on 4L nasal cannula, O2 sats on 90s. VS monitored. Safety and fall precautions observed. Call bell within reach.   Problem: Education: Goal: Knowledge of disease or condition will improve Outcome: Progressing Goal: Knowledge of the prescribed therapeutic regimen will improve Outcome: Progressing   Problem: Activity: Goal: Ability to tolerate increased activity will improve Outcome: Progressing Goal: Will verbalize the importance of balancing activity with adequate rest periods Outcome: Progressing   Problem: Respiratory: Goal: Ability to maintain a clear airway will improve Outcome: Progressing Goal: Levels of oxygenation will improve Outcome: Progressing   Problem: Activity: Goal: Ability to tolerate increased activity will improve Outcome: Progressing   Problem: Respiratory: Goal: Ability to maintain adequate ventilation will improve Outcome: Progressing

## 2022-01-22 NOTE — Progress Notes (Signed)
Pharmacy Antibiotic Note  Philip [name] is a 66 y.o. male who presented to ED on 01/12/2022 with SOB. CXR on 01/12/22 showed "increased bilateral interstitial and patchy left lung opacities" and he was started on azithromycin and ceftriaxone on admission for suspected PNA.  On 12/15, pharmacy has been consulted to escalate abx to cefepime, vancomycin.  Day #6 Cefepime - Afebrile - WBC 13.2 on 12/19 (on steroids) - SCr 0.91, CrCl 80 ml/min   Plan: - Continue cefepime 2 gm IV q8h - CCM recommends 7 days total PsA coverage - No dose adjustments anticipated.  Pharmacy will sign off and monitor peripherally via electronic surveillance software for any changes in renal function or micro data.   _____________________________________  Height: 5\' 6"  (167.6 cm) Weight: 82.6 kg (182 lb) IBW/kg (Calculated) : 63.8  Temp (24hrs), Avg:97.7 F (36.5 C), Min:97.5 F (36.4 C), Max:97.8 F (36.6 C)  Recent Labs  Lab 01/15/22 1139 01/16/22 1231 01/16/22 1513 01/18/22 0630 01/20/22 0645  WBC  --  6.1  --  14.3* 13.2*  CREATININE 0.98 0.88  --  1.15 0.91  LATICACIDVEN  --  3.0* 2.2*  --   --      Estimated Creatinine Clearance: 80.5 mL/min (by C-G formula based on SCr of 0.91 mg/dL).    Allergies  Allergen Reactions   Carboplatin Anaphylaxis, Shortness Of Breath, Cough and Hypertension    On dose 8. Became short of breath. Symptom management was called. Received benadryl, solumedrol, Pepcid and fluids. Medication discontinued.    Klonopin [Clonazepam] Other (See Comments)    nervous   Norco [Hydrocodone-Acetaminophen] Other (See Comments)    Made pt feel jittery    12/11 cefepime x1>> resumed 12/15>> 12/11 CTX>> 12/15 12/11 Azithro>>12/15 12/15 Vanc >> 12/18  12/11 bcx x2: Neg 12/15 MRSA PCR: not collected, reordered 12/18: neg 12/18 RVP: neg  Thank you for allowing pharmacy to be a part of this patient's care.  Peggyann Juba, PharmD, BCPS Pharmacy: [phone] 01/22/2022  7:34 AM

## 2022-01-25 ENCOUNTER — Other Ambulatory Visit: Payer: Self-pay | Admitting: Physician Assistant

## 2022-01-25 DIAGNOSIS — R1310 Dysphagia, unspecified: Secondary | ICD-10-CM

## 2022-01-27 ENCOUNTER — Other Ambulatory Visit: Payer: Self-pay | Admitting: Physician Assistant

## 2022-01-29 ENCOUNTER — Other Ambulatory Visit: Payer: Self-pay

## 2022-02-05 ENCOUNTER — Encounter (HOSPITAL_COMMUNITY): Payer: Self-pay

## 2022-02-05 ENCOUNTER — Other Ambulatory Visit: Payer: Self-pay | Admitting: Internal Medicine

## 2022-02-05 ENCOUNTER — Encounter: Payer: Self-pay | Admitting: Physician Assistant

## 2022-02-05 ENCOUNTER — Inpatient Hospital Stay (HOSPITAL_BASED_OUTPATIENT_CLINIC_OR_DEPARTMENT_OTHER): Payer: No Typology Code available for payment source | Admitting: Internal Medicine

## 2022-02-05 ENCOUNTER — Inpatient Hospital Stay: Payer: No Typology Code available for payment source

## 2022-02-05 ENCOUNTER — Other Ambulatory Visit: Payer: Self-pay

## 2022-02-05 ENCOUNTER — Inpatient Hospital Stay: Payer: No Typology Code available for payment source | Attending: Internal Medicine

## 2022-02-05 ENCOUNTER — Encounter: Payer: Self-pay | Admitting: Internal Medicine

## 2022-02-05 ENCOUNTER — Ambulatory Visit (HOSPITAL_COMMUNITY)
Admission: RE | Admit: 2022-02-05 | Discharge: 2022-02-05 | Disposition: A | Discharge status: Home / Self Care | Payer: No Typology Code available for payment source | Source: Ambulatory Visit | Attending: Internal Medicine | Admitting: Internal Medicine

## 2022-02-05 VITALS — BP 112/84 | HR 128 | Temp 98.1°F | Resp 22 | Wt 187.1 lb

## 2022-02-05 DIAGNOSIS — C3490 Malignant neoplasm of unspecified part of unspecified bronchus or lung: Secondary | ICD-10-CM | POA: Diagnosis not present

## 2022-02-05 DIAGNOSIS — C3491 Malignant neoplasm of unspecified part of right bronchus or lung: Secondary | ICD-10-CM

## 2022-02-05 LAB — CBC WITH DIFFERENTIAL (CANCER CENTER ONLY)
Abs Immature Granulocytes: 0.08 10*3/uL — ABNORMAL HIGH (ref 0.00–0.07)
Basophils Absolute: 0.1 10*3/uL (ref 0.0–0.1)
Basophils Relative: 1 %
Eosinophils Absolute: 0.1 10*3/uL (ref 0.0–0.5)
Eosinophils Relative: 1 %
HCT: 39.4 % (ref 39.0–52.0)
Hemoglobin: 13.5 g/dL (ref 13.0–17.0)
Immature Granulocytes: 1 %
Lymphocytes Relative: 10 %
Lymphs Abs: 1 10*3/uL (ref 0.7–4.0)
MCH: 31.6 pg (ref 26.0–34.0)
MCHC: 34.3 g/dL (ref 30.0–36.0)
MCV: 92.3 fL (ref 80.0–100.0)
Monocytes Absolute: 0.8 10*3/uL (ref 0.1–1.0)
Monocytes Relative: 9 %
Neutro Abs: 7.2 10*3/uL (ref 1.7–7.7)
Neutrophils Relative %: 78 %
Platelet Count: 210 10*3/uL (ref 150–400)
RBC: 4.27 MIL/uL (ref 4.22–5.81)
RDW: 14.1 % (ref 11.5–15.5)
WBC Count: 9.2 10*3/uL (ref 4.0–10.5)
nRBC: 0 % (ref 0.0–0.2)

## 2022-02-05 LAB — CMP (CANCER CENTER ONLY)
ALT: 13 U/L (ref 0–44)
AST: 19 U/L (ref 15–41)
Albumin: 3.4 g/dL — ABNORMAL LOW (ref 3.5–5.0)
Alkaline Phosphatase: 95 U/L (ref 38–126)
Anion gap: 9 (ref 5–15)
BUN: 15 mg/dL (ref 8–23)
CO2: 28 mmol/L (ref 22–32)
Calcium: 9.6 mg/dL (ref 8.9–10.3)
Chloride: 101 mmol/L (ref 98–111)
Creatinine: 0.96 mg/dL (ref 0.61–1.24)
GFR, Estimated: >60 mL/min (ref >60)
Glucose, Bld: 105 mg/dL — ABNORMAL HIGH (ref 70–99)
Potassium: 4.4 mmol/L (ref 3.5–5.1)
Sodium: 138 mmol/L (ref 135–145)
Total Bilirubin: 0.5 mg/dL (ref 0.3–1.2)
Total Protein: 6.6 g/dL (ref 6.5–8.1)

## 2022-02-05 MED ORDER — DOXYCYCLINE HYCLATE 100 MG PO TABS: 100.0000 mg | ORAL_TABLET | Freq: Two times a day (BID) | ORAL | 0 refills | Status: DC

## 2022-02-05 MED ORDER — PREDNISONE 20 MG PO TABS: ORAL_TABLET | ORAL | 0 refills | Status: DC

## 2022-02-05 MED ORDER — IOHEXOL 350 MG/ML SOLN
75.0000 mL | Freq: Once | INTRAVENOUS | Status: AC | PRN
  Administered 2022-02-05: 75 mL via INTRAVENOUS

## 2022-02-05 NOTE — Progress Notes (Addendum)
[facility] [phone]   [phone]  OFFICE PROGRESS NOTE  [name], MD [address] [address]  DIAGNOSIS:  1) recurrent non-small cell lung cancer initially diagnosed as stage IIIc (T4, N3, M0) non-small cell lung cancer, adenocarcinoma presented with large right hilar mass in addition to bilateral hilar and mediastinal lymphadenopathy diagnosed in December 2020.  The patient has evidence for disease recurrence in October 2022. 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  Started on Xarelto on March 17, 2020 and currently 20 mg p.o. daily.   PRIOR THERAPY:  1) Weekly concurrent chemoradiation with carboplatin for an AUC of 2 and paclitaxel 45 mg/m2. First dose starting 01/30/2019.   Status post 6 cycles. 2) Consolidation immunotherapy with Imfinzi 1500 mg IV every 4 weeks. First dose expected on 04/19/2019.  Status post 13 cycles. 3) disease recurrence in October 2022.   CURRENT THERAPY: Systemic chemotherapy with carboplatin for AUC of 5, Alimta 500 Mg/M2 and Keytruda 200 Mg IV every 3 weeks.  First dose January 01, 2021.  Status post 18 cycles.  He had hypersensitivity reaction to carboplatin and this was discontinued after cycle #2.  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  INTERVAL HISTORY: Philip [name] 67 y.o. male returns to the clinic to day for follow-up visit accompanied by his wife.  The patient continues to have significant shortness of breath at baseline increased with exertion.  His oxygen saturation at room air goes down to 60% and up to 86% with 4 L of oxygen nasal cannula.  He also has some tightness in the left side of his chest as well as the swelling of the lower extremity more on the left side.  He is currently on Xarelto for history  of pulmonary embolism diagnosed in February 2022.  He was admitted to the hospital recently and treated for suspicious pneumonia as well as pneumonitis.  He was treated with an aggressive course of antibiotics as well as steroids.  He is currently on prednisone 5 mg p.o. daily.  He was seen by Dr. [name] during his hospitalization.  He is followed by Dr. Shearon Stalls with [facility].  He has no current nausea, vomiting, diarrhea or constipation.  He has no headache or visual changes.  His last CT scan of the chest, abdomen and pelvis last month showed no clear evidence for disease progression.  The patient was here today for evaluation before starting cycle #19 of his maintenance treatment with Alimta.   MEDICAL HISTORY: Past Medical History:  Diagnosis Date   COPD (chronic obstructive pulmonary disease) (HCC)    GERD (gastroesophageal reflux disease)    Headache    migraines   Hyperlipidemia    lung ca dx'd 12/2018   Pneumonia    Pulmonary embolus (HCC)    Tobacco abuse     ALLERGIES:  is allergic to carboplatin, klonopin [clonazepam], and norco [hydrocodone-acetaminophen].  MEDICATIONS:  Current Outpatient Medications  Medication Sig Dispense Refill   acetaminophen (TYLENOL) 500 MG tablet Take 500-1,000 mg by mouth every 6 (six) hours as needed (pain.).     albuterol (PROVENTIL) (2.5 MG/3ML) 0.083% nebulizer solution Take 3 mLs (2.5 mg total) by nebulization every 4 (four) hours as needed for wheezing or shortness of breath. 75 mL 2   albuterol (VENTOLIN HFA)  108 (90 Base) MCG/ACT inhaler Inhale 2 puffs into the lungs every 4 (four) hours as needed for wheezing or shortness of breath. 18 g 11   Budeson-Glycopyrrol-Formoterol (BREZTRI AEROSPHERE) 160-9-4.8 MCG/ACT AERO Inhale 2 puffs into the lungs in the morning and at bedtime. 1 each 3   dextromethorphan-guaiFENesin (MUCINEX DM) 30-600 MG 12hr tablet Take 1 tablet by mouth 2 (two) times daily as needed for cough. 40 tablet 0    famotidine (PEPCID) 20 MG tablet TAKE 1 TABLET BY MOUTH TWICE A DAY 725 tablet 1   folic acid (FOLVITE) 1 MG tablet TAKE 1 TABLET BY MOUTH EVERY DAY (Patient taking differently: Take 1 mg by mouth every evening.) 90 tablet 1   guaiFENesin (MUCINEX) 600 MG 12 hr tablet Take 2 tablets (1,200 mg total) by mouth 2 (two) times daily as needed. 60 tablet 0   ipratropium-albuterol (DUONEB) 0.5-2.5 (3) MG/3ML SOLN Take 3 mLs by nebulization every 4 (four) hours as needed. 360 mL 1   midodrine (PROAMATINE) 5 MG tablet Take 1 tablet (5 mg total) by mouth 3 (three) times daily with meals. 90 tablet 0   Multiple Vitamins-Minerals (MULTI ADULT GUMMIES) CHEW Chew 1 tablet by mouth in the morning and at bedtime.     naphazoline-pheniramine (VISINE) 0.025-0.3 % ophthalmic solution Place 1 drop into both eyes daily as needed for eye irritation.     pantoprazole (PROTONIX) 40 MG tablet Take 1 tablet (40 mg total) by mouth daily. 30 tablet 1   rivaroxaban (XARELTO) 20 MG TABS tablet Take 1 tablet (20 mg total) by mouth daily with supper. 30 tablet 11   tamsulosin (FLOMAX) 0.4 MG CAPS capsule Take 0.4 mg by mouth at bedtime.     No current facility-administered medications for this visit.    SURGICAL HISTORY:  Past Surgical History:  Procedure Laterality Date   COLONOSCOPY     CYSTOSCOPY W/ URETERAL STENT PLACEMENT Right 10/24/2021   Procedure: CYSTOSCOPY WITH RETROGRADE PYELOGRAM/URETERAL STENT PLACEMENT;  Surgeon: Janith Lima, MD;  Location: [facility];  Service: Urology;  Laterality: Right;   HAND SURGERY     VIDEO BRONCHOSCOPY WITH ENDOBRONCHIAL ULTRASOUND N/A 12/22/2018   Procedure: VIDEO BRONCHOSCOPY WITH ENDOBRONCHIAL ULTRASOUND;  Surgeon: Garner Nash, DO;  Location: [facility];  Service: Thoracic;  Laterality: N/A;   VIDEO BRONCHOSCOPY WITH ENDOBRONCHIAL ULTRASOUND N/A 01/09/2019   Procedure: VIDEO BRONCHOSCOPY WITH ENDOBRONCHIAL ULTRASOUND WITH FLUORO;  Surgeon: Garner Nash, DO;  Location: [facility];   Service: Thoracic;  Laterality: N/A;   VIDEO BRONCHOSCOPY WITH RADIAL ENDOBRONCHIAL ULTRASOUND N/A 12/22/2018   Procedure: RADIAL ENDOBRONCHIAL ULTRASOUND;  Surgeon: Garner Nash, DO;  Location: [facility];  Service: Thoracic;  Laterality: N/A;    REVIEW OF SYSTEMS:  Constitutional: positive for fatigue Eyes: negative Ears, nose, mouth, throat, and face: negative Respiratory: positive for cough, dyspnea on exertion, and pleurisy/chest pain Cardiovascular: negative Gastrointestinal: negative Genitourinary:negative Integument/breast: negative Hematologic/lymphatic: negative Musculoskeletal:positive for muscle weakness Neurological: negative Behavioral/Psych: negative Endocrine: negative Allergic/Immunologic: negative   PHYSICAL EXAMINATION: General appearance: alert, cooperative, fatigued, and no distress Head: Normocephalic, without obvious abnormality, atraumatic Neck: no adenopathy, no JVD, supple, symmetrical, trachea midline, and thyroid not enlarged, symmetric, no tenderness/mass/nodules Lymph nodes: Cervical, supraclavicular, and axillary nodes normal. Resp: rales bilaterally and wheezes bilaterally Back: symmetric, no curvature. ROM normal. No CVA tenderness. Cardio: regular rate and rhythm, S1, S2 normal, no murmur, click, rub or gallop GI: soft, non-tender; bowel sounds normal; no masses,  no organomegaly Extremities: extremities normal, atraumatic, no  cyanosis or edema Neurologic: Alert and oriented X 3, normal strength and tone. Normal symmetric reflexes. Normal coordination and gait  ECOG PERFORMANCE STATUS: 1 - Symptomatic but completely ambulatory  Blood pressure 112/84, pulse (!) 128, temperature 98.1 F (36.7 C), temperature source Temporal, resp. rate 17, weight 187 lb 1.6 oz (84.9 kg), SpO2 (!) 68 %.  LABORATORY DATA: Lab Results  Component Value Date   WBC 9.2 02/05/2022   HGB 13.5 02/05/2022   HCT 39.4 02/05/2022   MCV 92.3 02/05/2022   PLT 210 02/05/2022       Chemistry      Component Value Date/Time   NA 138 01/20/2022 0645   K 4.6 01/20/2022 0645   CL 100 01/20/2022 0645   CO2 29 01/20/2022 0645   BUN 22 01/20/2022 0645   CREATININE 0.91 01/20/2022 0645   CREATININE 0.94 12/22/2021 1104      Component Value Date/Time   CALCIUM 8.6 (L) 01/20/2022 0645   ALKPHOS 82 01/20/2022 0645   AST 26 01/20/2022 0645   AST 22 12/22/2021 1104   ALT 41 01/20/2022 0645   ALT 16 12/22/2021 1104   BILITOT 0.3 01/20/2022 0645   BILITOT 0.5 12/22/2021 1104       RADIOGRAPHIC STUDIES: DG CHEST PORT 1 VIEW  Result Date: 01/19/2022 CLINICAL DATA:  Follow-up pneumonia. EXAM: PORTABLE CHEST 1 VIEW COMPARISON:  January 16, 2022. FINDINGS: Stable cardiomediastinal silhouette. Left lung opacity noted on prior exam is slightly decreased compared to prior exam, suggesting improving pneumonia or edema. Mildly elevated right hemidiaphragm is noted with probable right upper lobe subsegmental atelectasis or scarring. Bony thorax is unremarkable. IMPRESSION: Decreased left lung opacity is noted suggesting improving pneumonia or asymmetric edema. Electronically Signed   By: Marijo Conception M.D.   On: 01/19/2022 10:10   DG CHEST PORT 1 VIEW  Result Date: 01/16/2022 CLINICAL DATA:  COPD.  Dyspnea. EXAM: PORTABLE CHEST 1 VIEW COMPARISON:  Chest radiographs 01/12/2022 and 10/23/2021; CT chest 01/07/2022 FINDINGS: Cardiac silhouette is again mildly enlarged. Moderately decreased lung volumes are similar to prior. Chronic superior right lung scarring is similar to multiple prior radiographs and CT. Moderate left lower lung interstitial thickening is seen on prior remote baseline radiographs including 10/23/2021 and there is left mid and lower lung heterogeneous airspace opacification that is slightly increased from 01/12/2022 and new from 10/23/2021. No definite pleural effusion. No pneumothorax. Mild-to-moderate multilevel degenerative disc changes of the thoracic  spine. IMPRESSION: 1. Moderate left lower lung heterogeneous airspace opacification is slightly increased from 01/12/2022 and new from 10/23/2021. This may represent pneumonia or asymmetric pulmonary edema. 2. Chronic superior right lung linear scarring is similar to multiple prior radiographs and CT. Electronically Signed   By: Yvonne Kendall M.D.   On: 01/16/2022 10:06   DG Chest 2 View  Result Date: 01/12/2022 CLINICAL DATA:  Patient with history of lung cancer with Two-week history of shortness of breath EXAM: CHEST - 2 VIEW COMPARISON:  Chest radiograph dated 10/23/2021, CT chest dated 01/07/2022 FINDINGS: Low lung volumes with similar asymmetric elevation of the right hemidiaphragm. Similar appearance of perihilar consolidations. Increased bilateral interstitial and patchy left lung opacities. No pleural effusion or pneumothorax. The heart size and mediastinal contours are within normal limits. The visualized skeletal structures are unremarkable. Partially imaged catheter projects over the right lower hemiabdomen. IMPRESSION: 1. Increased bilateral interstitial and patchy left lung opacities, which could represent pulmonary edema or infection. 2. Similar appearance of perihilar consolidations, better evaluated as postradiation changes  on prior chest CT. Electronically Signed   By: Darrin Nipper M.D.   On: 01/12/2022 08:49   CT CHEST ABDOMEN PELVIS W CONTRAST  Result Date: 01/09/2022 CLINICAL DATA:  Bronchogenic carcinoma. Patient status post chemo radiation therapy. Restaging exam. * Tracking Code: BO * EXAM: CT CHEST, ABDOMEN, AND PELVIS WITH CONTRAST TECHNIQUE: Multidetector CT imaging of the chest, abdomen and pelvis was performed following the standard protocol during bolus administration of intravenous contrast. RADIATION DOSE REDUCTION: This exam was performed according to the departmental dose-optimization program which includes automated exposure control, adjustment of the mA and/or kV according to  patient size and/or use of iterative reconstruction technique. CONTRAST:  45mL OMNIPAQUE IOHEXOL 300 MG/ML  SOLN FINDINGS: CT CHEST FINDINGS Cardiovascular: No significant vascular findings. Normal heart size. No pericardial effusion. Mediastinum/Nodes: Small prevascular lymph nodes are unchanged. New mediastinal hilar adenopathy. No supraclavicular adenopathy. No axillary adenopathy. Lungs/Pleura: Perihilar interstitial thickening and cystic change typical of radiation sequela noted about both the LEFT and RIGHT hilum. Findings are unchanged from comparison exam. Within the LEFT lower lobe there is new subtle superimposed ill-defined ground-glass nodularity. For example 6 mm nodule on image 81/4. Peribronchial nodule on image 86/4. Mild increase interstitial thickening similar peribronchial ground-glass density and interstitial thickening in the lingula (image 79/4. Musculoskeletal: No aggressive osseous lesion. CT ABDOMEN AND PELVIS FINDINGS Hepatobiliary: No focal hepatic lesion. No biliary ductal dilatation. Gallbladder is normal. Common bile duct is normal. Pancreas: Pancreas is normal. No ductal dilatation. No pancreatic inflammation. Spleen: No change in benign LEFT adrenal adenoma measuring 26 mm. Smaller adenoma of the of the RIGHT adrenal gland. Kidneys normal. Double-J ureteral stent within the RIGHT renal pelvis extending to the bladder. No RIGHT hydronephrosis. Adrenals/urinary tract: Adrenal glands and kidneys are normal. The ureters and bladder normal. Stomach/Bowel: Stomach, small bowel, appendix, and cecum are normal. The colon and rectosigmoid colon are normal. Vascular/Lymphatic: Abdominal aorta is normal caliber. There is no retroperitoneal or periportal lymphadenopathy. No pelvic lymphadenopathy. Reproductive: Prostate unremarkable Other: No free fluid. Musculoskeletal: No aggressive osseous lesion. IMPRESSION: Chest Impression: 1. No evidence of lung cancer recurrence. 2. Stable postradiation  change within the LEFT and RIGHT lung about the hila. 3. New interstitial thickening and ill-defined ground-glass densities/nodules lingula and lower lobe. Findings suggest pulmonary infection versus continue radiation change versus unlikely malignancy. Recommend clinical correlation for pulmonary infection and attention on follow-up. 4. Small mediastinal lymph nodes are unchanged. Abdomen / Pelvis Impression: 1. No evidence lung cancer metastasis in the abdomen pelvis. 2. Interval placement RIGHT double-J ureteral stent. No hydronephrosis. 3. No skeletal metastasis. Electronically Signed   By: Suzy Bouchard M.D.   On: 01/09/2022 10:06     ASSESSMENT AND PLAN: This is a very pleasant 67 years old white male with metastatic non-small cell lung cancer that was initially diagnosed as stage IIIc non-small cell lung cancer, adenocarcinoma diagnosed in December 2020 The patient completed a course of concurrent chemoradiation with weekly carboplatin and paclitaxel status post 6 cycles and he tolerated his treatment well and has partial response. He underwent consolidation treatment with immunotherapy with Imfinzi 1500 mg IV every 4 weeks status post 13 cycles. He was on observation and feeling fine with no concerning complaints except for mild cough as well as the left shoulder pain. His scan showed increase in the size of the soft tissue nodule in the right middle lobe adjacent to the postradiation changes highly suspicious for recurrent disease.  There was also soft tissue of the right hilum/as  ago esophageal recess unchanged at but increased compared to remote prior studies. The patient had a PET scan that showed hypermetabolic nodule in the right lower lobe adjacent to the post radiation treatment site increased in size and consistent with lung cancer recurrence.  There was second hypermetabolic nodule in the right lower lobe above the diaphragm concerning for recurrence and hypermetabolic contralateral  lymph node in the high left paratracheal mediastinum also concerning for metastatic adenopathy recurrence. He started systemic chemotherapy with carboplatin for AUC of 5, Alimta 500 Mg/M2 and Keytruda 200 Mg IV every 3 weeks on January 01, 2021.   Carboplatin was discontinued with cycle #2 secondary to hypersensitivity reaction.  The patient is status post 18 cycles.  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  The patient is currently on single agent treatment with Alimta. The patient has been tolerating his treatment well but recently was admitted to the hospital with significant dyspnea and suspicious multifocal pneumonia/pneumonitis.  He was treated with aggressive course of antibiotics during his hospitalization and he is currently on a tapered dose of prednisone. He continues to have significant shortness of breath and his oxygen saturation with room air is down to the 60s and with 4 L of oxygen nasal cannula it is up to 86%. I recommended for the patient to delay the start of cycle #19 of his treatment with Alimta for now by at least 2 weeks. For the shortness of breath, I will order CT angiogram of the chest to rule out any recurrent pulmonary embolism.  If positive, he will need to be admitted to the hospital for anticoagulation and changing his treatment to subcutaneous Lovenox after initial heparin infusion.  If there is any delay in having his CT angiogram of the chest today, I will send the patient to the emergency department for further evaluation and to get his imaging studies and treatment sooner. If the scan is negative for pulmonary embolism and no evidence for disease progression, we will refer him back to Dr. Shearon Stalls for evaluation of his pulmonary status and other recommendation regarding his treatment. I will arrange for the patient to come back for follow-up visit in 2 weeks for evaluation before resuming  his maintenance treatment with Alimta. For the hydronephrosis, he is followed by urology and had a ureteral stent placed. He was advised to call immediately if he has any other concerning symptoms in the interval.  The patient voices understanding of current disease status and treatment options and is in agreement with the current care plan. All questions were answered. The patient knows to call the clinic with any problems, questions or concerns. We can certainly see the patient much sooner if necessary. The total time spent in the appointment was 40 minutes.  Disclaimer: This note was dictated with voice recognition software. Similar sounding words can inadvertently be transcribed and may not be corrected upon review.  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  There was new ill-defined groundglass densities throughout the right lung and some of the previously noted ill-defined groundglass density in the left lung have resolved.  But there was some new groundglass densities as well.  The findings remain concerning for ongoing infectious or inflammatory pneumonitis.  There was increased size of small right pleural effusion and similar bilateral perihilar  postradiation fibrosis. Will call the patient with the result and I will start him on high-dose prednisone to be tapered over the next few weeks.  In the meantime I will also give him prescription for doxycycline 100 mg p.o. twice daily for the next 2 weeks. The patient was also advised to reach out to Dr. Shearon Stalls his pulmonologist for reevaluation and recommendation regarding his pulmonary status. He will come back for follow-up visit as previously scheduled in 2 weeks.

## 2022-02-05 NOTE — Progress Notes (Signed)
SATURATION QUALIFICATIONS: (This note is used to comply with regulatory documentation for home oxygen)  Patient Saturations on Room Air at Rest = unable to perform  without supplemental oxygen.  Patient Saturations on Room Air while Ambulating = unable to perform without supplemental oxygen Patient Saturations on 2 Liters of oxygen while sitting and using inogen tank= 68% Patient saturations on 4 liters of oxygen while sitting and using cancer center oxygen tank..=90% Pt  has home oxygen .

## 2022-02-05 NOTE — Addendum Note (Signed)
Addended by: [name] on: 02/05/2022 01:57 PM   Modules accepted: Orders

## 2022-02-06 ENCOUNTER — Other Ambulatory Visit: Payer: Self-pay

## 2022-02-06 ENCOUNTER — Ambulatory Visit (INDEPENDENT_AMBULATORY_CARE_PROVIDER_SITE_OTHER): Payer: No Typology Code available for payment source | Admitting: Acute Care

## 2022-02-06 ENCOUNTER — Encounter: Payer: Self-pay | Admitting: Acute Care

## 2022-02-06 VITALS — BP 96/76 | HR 111

## 2022-02-06 DIAGNOSIS — J181 Lobar pneumonia, unspecified organism: Secondary | ICD-10-CM

## 2022-02-06 DIAGNOSIS — J984 Other disorders of lung: Secondary | ICD-10-CM | POA: Diagnosis not present

## 2022-02-06 DIAGNOSIS — J449 Chronic obstructive pulmonary disease, unspecified: Secondary | ICD-10-CM

## 2022-02-06 DIAGNOSIS — C3490 Malignant neoplasm of unspecified part of unspecified bronchus or lung: Secondary | ICD-10-CM

## 2022-02-06 DIAGNOSIS — J9622 Acute and chronic respiratory failure with hypercapnia: Secondary | ICD-10-CM

## 2022-02-06 MED ORDER — ALBUTEROL SULFATE (2.5 MG/3ML) 0.083% IN NEBU
2.5000 mg | INHALATION_SOLUTION | Freq: Once | RESPIRATORY_TRACT | Status: AC
  Administered 2022-02-06: 2.5 mg via RESPIRATORY_TRACT

## 2022-02-06 NOTE — Patient Instructions (Addendum)
It is good top see you today Continue the Doxycycline Dr. Julien Nordmann prescribed 02/05/2022. Continue the prednisone taper started by Dr. Julien Nordmann 02/05/2022.  Follow up with Dr. Julien Nordmann 02/19/2022 as is scheduled.  You need to wear continuous oxygen , as you need more oxygen flow than the portable unit can provide.  Please make sure your oxygen saturations are > 88% at all times.  Increase oxygen as needed to maintain oxygen at > 88%. Mucinex 1200 mg daily with a full glass of water.  We will place an order for small oxygen tanks to replace the portable oxygen you have now.  We will request nasal cannula that is more like those in the hospital for home use. You can try Duodem dressings to the area on your nose that are so sore.  Continue to use neosporin to nose sore.  Call for any signs of infection Please use the albuterol treatments BID without fail while you are recovering from pneumonia.  Add Mucinex 1200 mg daily with a full glass if water to thin secretions Flutter valve 4 blows 4-6 times daily while awake.  Follow up in 2 weeks with Judson Roch NP , or sooner if needed. Please contact office for sooner follow up if symptoms do not improve or worsen or seek emergency care

## 2022-02-06 NOTE — Progress Notes (Signed)
History of Present Illness Philip [name] is a 67 y.o. male former smoker  ( Quit 1 month  ago) with a  49 pack year smoking history with recurrent  non-small cell lung cancer initially diagnosed as stage IIIc (T4, N3, M0) non-small cell lung cancer, adenocarcinoma . Originally diagnosed as a large right hilar mass in addition to bilateral hilar and mediastinal lymphadenopathy diagnosed in December 2020. The patient has evidence for disease recurrence in October 2022. 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 Started on Xarelto on March 17, 2020 and currently 20 mg p.o. daily.   Prior Therapy 1) Weekly concurrent chemoradiation with carboplatin for an AUC of 2 and paclitaxel 45 mg/m2. First dose starting 01/30/2019.   Status post 6 cycles. 2) Consolidation immunotherapy with Imfinzi 1500 mg IV every 4 weeks. First dose expected on 04/19/2019.  Status post 13 cycles. 3) disease recurrence in October 2022.   CURRENT THERAPY: Systemic chemotherapy with carboplatin for AUC of 5, Alimta 500 Mg/M2 and Keytruda 200 Mg IV every 3 weeks.  First dose January 01, 2021.  Status post 18 cycles.  He had hypersensitivity reaction to carboplatin and this was discontinued after cycle #2.  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     02/06/2022 Pt. Presents for follow up after seeing Dr. Julien [name] 02/05/2022 and he was noted to have hypoxemia, and shortness of breath.Oxygen saturations were 60% on RA and rebounded to 86% with 4 L [place]. He also has some tightness in the left side of his chest as well as the swelling of the lower extremity more on the left side. He has been on [place] for PE diagnosed 03/2020. He was admitted to the hospital recently, 01/12/2022-01/22/2022 and treated for suspicious pneumonia as well as pneumonitis. Prior top  hospitalization he was not wearing oxygen. He continues to work. He was treated with an aggressive course of antibiotics as well as steroids. He was sent home from the hospital with levaquin and prednisone taper. He is currently on prednisone 5 mg p.o. daily. He completed the Levaquin dosing 01/29/22.  He was seen by Dr. Silas [name] during his hospitalization. He is followed by Dr. Shearon [name] with [name].  Dr. Earlie [name] did a CTA Chest 02/05/2022 as he was concerned about possible PE despite anticoagulation treatment. This scan was negative for PE but showed New ill-defined ground-glass densities throughout the right lung. Some of the previously noted ill-defined ground-glass densities in the left upper and lower lobes had resolved, but there are some new ground-glass densities as well  an  Increased small right pleural effusion and bilateral parahilar post radiation fibrosis.There was concern for  ongoing infectious inflammatory pneumonitis. Dr. Earlie [name] started the patient on a high dose long prednisone taper as well as Doxycycline x 15 days on 02/05/2022. He is compliant with this. He understands he needs to complete treatment of both medications.  The patient presented today wearing his POA which provides pulsed oxygen. His saturations were 83%, which he states he is able to tolerate well. I had a long discussion with him about while he may feel like he can tolerate hypoxia, it take a real toll on his heart and brain. We have told him he needs to wear continuous flow oxygen , and that his sats must always be > 88-90%. He was smoking until right before being  admitted to the hospital 12/11, and has not had a cigarette since.We also discussed using albuterol nebs for his shortness of breath. He has not been using these since his discharge from the hospital. I have asked him to resume these twice daily without failm and can increase to 3 times daily if needed. We gave him a treatment in the office  today, and his breath sounds improved. Pt states he is not coughing up any secretions, and he does not have a cough. He is not using Mucinex, or a flutter valve.  Maintenance chemo once every 3 weeks, but nothing in 6 weeks due to hospitalization. Plan is for next infusion 1/18 as long as he is improving from his current infection . He has follow up with Dr. Earlie [name] in 2 weeks.  Pt. Has a work obligation in [place] next week. I have asked him to see if this can be done remotely.      Test Results: CTA Chest 02/05/2022 Lungs/Pleura: New ill-defined ground-glass densities throughout the right lung. Some of the previously noted ill-defined ground-glass densities in the left upper and lower lobes have resolved, but there are some new ground-glass densities as well, for example in the peripheral left upper lobe (series 6, image 60). Increased small right pleural effusion. Similar bilateral parahilar post radiation changes and bronchiectasis. No pneumothorax. Severe emphysema again noted.   Upper Abdomen: No acute abnormality. Unchanged bilateral adrenal adenomas. No follow-up imaging is recommended.   Musculoskeletal: No chest wall abnormality. No acute or significant osseous findings.   Review of the MIP images confirms the above findings.   IMPRESSION: 1. No evidence of pulmonary embolism. 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 2. New ill-defined ground-glass densities throughout the right lung. Some of the previously noted ill-defined ground-glass densities in the left lung have resolved, but there are some new ground-glass densities as well. Findings remain concerning for ongoing infectious or inflammatory pneumonitis. 3. Increased small right pleural effusion. 4. Similar bilateral parahilar post radiation fibrosis. 5. Aortic Atherosclerosis (ICD10-I70.0) and Emphysema (ICD10-J43.9).       Latest Ref Rng & Units 02/05/2022    10:48 AM 01/20/2022    6:45 AM 01/18/2022    6:30 AM  CBC  WBC 4.0 - 10.5 K/uL 9.2  13.2  14.3   Hemoglobin 13.0 - 17.0 g/dL 13.5  12.0  11.7   Hematocrit 39.0 - 52.0 % 39.4  38.6  37.1   Platelets 150 - 400 K/uL 210  317  319        Latest Ref Rng & Units 02/05/2022   10:48 AM 01/20/2022    6:45 AM 01/18/2022    6:30 AM  BMP  Glucose 70 - 99 mg/dL 105  104  114   BUN 8 - 23 mg/dL 15  22  24    Creatinine 0.61 - 1.24 mg/dL 0.96  0.91  1.15   Sodium 135 - 145 mmol/L 138  138  140   Potassium 3.5 - 5.1 mmol/L 4.4  4.6  4.4   Chloride 98 - 111 mmol/L 101  100  103   CO2 22 - 32 mmol/L 28  29  27    Calcium 8.9 - 10.3 mg/dL 9.6  8.6  8.9     BNP    Component Value Date/Time   BNP 104.1 (H) 01/12/2022 0840    ProBNP No results found for: "PROBNP"  PFT    Component Value Date/Time   FEV1PRE 2.07  12/21/2018 1238   FEV1POST 2.28 12/21/2018 1238   FVCPRE 3.07 12/21/2018 1238   FVCPOST 3.23 12/21/2018 1238   TLC 5.35 12/21/2018 1238   DLCOUNC 15.47 12/21/2018 1238   PREFEV1FVCRT 67 12/21/2018 1238   PSTFEV1FVCRT 71 12/21/2018 1238    CT Angio Chest Pulmonary Embolism (PE) W or WO Contrast  Result Date: 02/05/2022 CLINICAL DATA:  Significant shortness of breath and dyspnea on exertion. History of lung cancer and pulmonary embolism. EXAM: CT ANGIOGRAPHY CHEST WITH CONTRAST TECHNIQUE: Multidetector CT imaging of the chest was performed using the standard protocol during bolus administration of intravenous contrast. Multiplanar CT image reconstructions and MIPs were obtained to evaluate the vascular anatomy. RADIATION DOSE REDUCTION: This exam was performed according to the departmental dose-optimization program which includes automated exposure control, adjustment of the mA and/or kV according to patient size and/or use of iterative reconstruction technique. CONTRAST:  37mL OMNIPAQUE IOHEXOL 350 MG/ML SOLN COMPARISON:  CT chest dated January 07, 2022. FINDINGS: Cardiovascular:  Satisfactory opacification of the pulmonary arteries to the segmental level. No evidence of pulmonary embolism. 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 Normal heart size. No pericardial effusion. No thoracic aortic aneurysm or dissection. Mild atherosclerotic calcification of the aortic arch. Mediastinum/Nodes: Mediastinal lymph nodes measuring up to 1.3 cm are unchanged. No enlarged axillary lymph nodes. The thyroid gland, trachea, and esophagus demonstrate no significant findings. Lungs/Pleura: New ill-defined ground-glass densities throughout the right lung. Some of the previously noted ill-defined ground-glass densities in the left upper and lower lobes have resolved, but there are some new ground-glass densities as well, for example in the peripheral left upper lobe (series 6, image 60). Increased small right pleural effusion. Similar bilateral parahilar post radiation changes and bronchiectasis. No pneumothorax. Severe emphysema again noted. Upper Abdomen: No acute abnormality. Unchanged bilateral adrenal adenomas. No follow-up imaging is recommended. Musculoskeletal: No chest wall abnormality. No acute or significant osseous findings. Review of the MIP images confirms the above findings. IMPRESSION: 1. No evidence of pulmonary embolism. 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 2. New ill-defined ground-glass densities throughout the right lung. Some of the previously noted ill-defined ground-glass densities in the left lung have resolved, but there are some new ground-glass densities as well. Findings remain concerning for ongoing infectious or inflammatory pneumonitis. 3. Increased small right pleural effusion. 4. Similar bilateral parahilar post radiation fibrosis. 5. Aortic Atherosclerosis (ICD10-I70.0) and Emphysema (ICD10-J43.9). These results will be called to the ordering clinician or representative by the  Radiologist Assistant, and communication documented in the PACS or [employer]. Electronically Signed   By: Titus Dubin M.D.   On: 02/05/2022 13:34   DG CHEST PORT 1 VIEW  Result Date: 01/19/2022 CLINICAL DATA:  Follow-up pneumonia. EXAM: PORTABLE CHEST 1 VIEW COMPARISON:  January 16, 2022. FINDINGS: Stable cardiomediastinal silhouette. Left lung opacity noted on prior exam is slightly decreased compared to prior exam, suggesting improving pneumonia or edema. Mildly elevated right hemidiaphragm is noted with probable right upper lobe subsegmental atelectasis or scarring. Bony thorax is unremarkable. IMPRESSION: Decreased left lung opacity is noted suggesting improving pneumonia or asymmetric edema. Electronically Signed   By: Marijo Conception M.D.   On: 01/19/2022 10:10   DG CHEST PORT 1 VIEW  Result Date: 01/16/2022 CLINICAL DATA:  COPD.  Dyspnea. EXAM: PORTABLE CHEST 1 VIEW COMPARISON:  Chest radiographs 01/12/2022 and 10/23/2021; CT chest 01/07/2022 FINDINGS: Cardiac silhouette is again mildly enlarged. Moderately decreased lung  volumes are similar to prior. Chronic superior right lung scarring is similar to multiple prior radiographs and CT. Moderate left lower lung interstitial thickening is seen on prior remote baseline radiographs including 10/23/2021 and there is left mid and lower lung heterogeneous airspace opacification that is slightly increased from 01/12/2022 and new from 10/23/2021. No definite pleural effusion. No pneumothorax. Mild-to-moderate multilevel degenerative disc changes of the thoracic spine. IMPRESSION: 1. Moderate left lower lung heterogeneous airspace opacification is slightly increased from 01/12/2022 and new from 10/23/2021. This may represent pneumonia or asymmetric pulmonary edema. 2. Chronic superior right lung linear scarring is similar to multiple prior radiographs and CT. Electronically Signed   By: Yvonne Kendall M.D.   On: 01/16/2022 10:06   DG Chest 2  View  Result Date: 01/12/2022 CLINICAL DATA:  Patient with history of lung cancer with Two-week history of shortness of breath EXAM: CHEST - 2 VIEW COMPARISON:  Chest radiograph dated 10/23/2021, CT chest dated 01/07/2022 FINDINGS: Low lung volumes with similar asymmetric elevation of the right hemidiaphragm. Similar appearance of perihilar consolidations. Increased bilateral interstitial and patchy left lung opacities. No pleural effusion or pneumothorax. The heart size and mediastinal contours are within normal limits. The visualized skeletal structures are unremarkable. Partially imaged catheter projects over the right lower hemiabdomen. IMPRESSION: 1. Increased bilateral interstitial and patchy left lung opacities, which could represent pulmonary edema or infection. 2. Similar appearance of perihilar consolidations, better evaluated as postradiation changes on prior chest CT. Electronically Signed   By: Darrin Nipper M.D.   On: 01/12/2022 08:49   CT CHEST ABDOMEN PELVIS W CONTRAST  Result Date: 01/09/2022 CLINICAL DATA:  Bronchogenic carcinoma. Patient status post chemo radiation therapy. Restaging exam. * Tracking Code: BO * EXAM: CT CHEST, ABDOMEN, AND PELVIS WITH CONTRAST TECHNIQUE: Multidetector CT imaging of the chest, abdomen and pelvis was performed following the standard protocol during bolus administration of intravenous contrast. RADIATION DOSE REDUCTION: This exam was performed according to the departmental dose-optimization program which includes automated exposure control, adjustment of the mA and/or kV according to patient size and/or use of iterative reconstruction technique. CONTRAST:  64mL OMNIPAQUE IOHEXOL 300 MG/ML  SOLN FINDINGS: CT CHEST FINDINGS Cardiovascular: No significant vascular findings. Normal heart size. No pericardial effusion. Mediastinum/Nodes: Small prevascular lymph nodes are unchanged. New mediastinal hilar adenopathy. No supraclavicular adenopathy. No axillary adenopathy.  Lungs/Pleura: Perihilar interstitial thickening and cystic change typical of radiation sequela noted about both the LEFT and RIGHT hilum. Findings are unchanged from comparison exam. Within the LEFT lower lobe there is new subtle superimposed ill-defined ground-glass nodularity. For example 6 mm nodule on image 81/4. Peribronchial nodule on image 86/4. Mild increase interstitial thickening similar peribronchial ground-glass density and interstitial thickening in the lingula (image 79/4. Musculoskeletal: No aggressive osseous lesion. CT ABDOMEN AND PELVIS FINDINGS Hepatobiliary: No focal hepatic lesion. No biliary ductal dilatation. Gallbladder is normal. Common bile duct is normal. Pancreas: Pancreas is normal. No ductal dilatation. No pancreatic inflammation. Spleen: No change in benign LEFT adrenal adenoma measuring 26 mm. Smaller adenoma of the of the RIGHT adrenal gland. Kidneys normal. Double-J ureteral stent within the RIGHT renal pelvis extending to the bladder. No RIGHT hydronephrosis. Adrenals/urinary tract: Adrenal glands and kidneys are normal. The ureters and bladder normal. Stomach/Bowel: Stomach, small bowel, appendix, and cecum are normal. The colon and rectosigmoid colon are normal. Vascular/Lymphatic: Abdominal aorta is normal caliber. There is no retroperitoneal or periportal lymphadenopathy. No pelvic lymphadenopathy. Reproductive: Prostate unremarkable Other: No free fluid. Musculoskeletal: No aggressive osseous  lesion. IMPRESSION: Chest Impression: 1. No evidence of lung cancer recurrence. 2. Stable postradiation change within the LEFT and RIGHT lung about the hila. 3. New interstitial thickening and ill-defined ground-glass densities/nodules lingula and lower lobe. Findings suggest pulmonary infection versus continue radiation change versus unlikely malignancy. Recommend clinical correlation for pulmonary infection and attention on follow-up. 4. Small mediastinal lymph nodes are unchanged.  Abdomen / Pelvis Impression: 1. No evidence lung cancer metastasis in the abdomen pelvis. 2. Interval placement RIGHT double-J ureteral stent. No hydronephrosis. 3. No skeletal metastasis. Electronically Signed   By: Suzy Bouchard M.D.   On: 01/09/2022 10:06     Past medical hx Past Medical History:  Diagnosis Date   COPD (chronic obstructive pulmonary disease) (HCC)    GERD (gastroesophageal reflux disease)    Headache    migraines   Hyperlipidemia    lung ca dx'd 12/2018   Pneumonia    Pulmonary embolus (HCC)    Tobacco abuse      Social History   Tobacco Use   Smoking status: Former    Packs/day: 2.00    Years: 45.00    Total pack years: 90.00    Types: Cigarettes    Start date: 07/04/1970    Quit date: 12/2021    Years since quitting: 0.1   Smokeless tobacco: Former    Types: Chew   Tobacco comments:    Smoking 2 packs of cigarettes a week. 10/14/2021 Tay    Smoking is on and off, will go a week without smoking then start up and smoke half a pack a day  Vaping Use   Vaping Use: Never used  Substance Use Topics   Alcohol use: Yes    Comment: hasn't drank in one month   Drug use: Not Currently    Types: Marijuana    Comment: rare edible use    Mr.Wellbrock reports that he quit smoking about 2 months ago. His smoking use included cigarettes. He started smoking about 51 years ago. He has a 90.00 pack-year smoking history. He has quit using smokeless tobacco.  His smokeless tobacco use included chew. He reports current alcohol use. He reports that he does not currently use drugs after having used the following drugs: Marijuana.  Tobacco Cessation: Counseling given: Not Answered Tobacco comments: Smoking 2 packs of cigarettes a week. 10/14/2021 Tay Smoking is on and off, will go a week without smoking then start up and smoke half a pack a day  Has not smoked since hospitalization 01/12/2022-01/22/2022 90 pack year smoking history Past surgical hx, Family hx, Social hx  all reviewed.  Current Outpatient Medications on File Prior to Visit  Medication Sig   acetaminophen (TYLENOL) 500 MG tablet Take 500-1,000 mg by mouth every 6 (six) hours as needed (pain.).   albuterol (PROVENTIL) (2.5 MG/3ML) 0.083% nebulizer solution Take 3 mLs (2.5 mg total) by nebulization every 4 (four) hours as needed for wheezing or shortness of breath.   albuterol (VENTOLIN HFA) 108 (90 Base) MCG/ACT inhaler Inhale 2 puffs into the lungs every 4 (four) hours as needed for wheezing or shortness of breath.   atorvastatin (LIPITOR) 20 MG tablet Take 20 mg by mouth daily.   Budeson-Glycopyrrol-Formoterol (BREZTRI AEROSPHERE) 160-9-4.8 MCG/ACT AERO Inhale 2 puffs into the lungs in the morning and at bedtime.   dextromethorphan-guaiFENesin (MUCINEX DM) 30-600 MG 12hr tablet Take 1 tablet by mouth 2 (two) times daily as needed for cough.   doxycycline (VIBRA-TABS) 100 MG tablet Take 1 tablet (100 mg  total) by mouth 2 (two) times daily.   famotidine (PEPCID) 20 MG tablet TAKE 1 TABLET BY MOUTH TWICE A DAY   folic acid (FOLVITE) 1 MG tablet TAKE 1 TABLET BY MOUTH EVERY DAY (Patient taking differently: Take 1 mg by mouth every evening.)   guaiFENesin (MUCINEX) 600 MG 12 hr tablet Take 2 tablets (1,200 mg total) by mouth 2 (two) times daily as needed.   ipratropium-albuterol (DUONEB) 0.5-2.5 (3) MG/3ML SOLN Take 3 mLs by nebulization every 4 (four) hours as needed.   midodrine (PROAMATINE) 5 MG tablet Take 1 tablet (5 mg total) by mouth 3 (three) times daily with meals.   Multiple Vitamins-Minerals (MULTI ADULT GUMMIES) CHEW Chew 1 tablet by mouth in the morning and at bedtime.   naphazoline-pheniramine (VISINE) 0.025-0.3 % ophthalmic solution Place 1 drop into both eyes daily as needed for eye irritation.   pantoprazole (PROTONIX) 40 MG tablet Take 1 tablet (40 mg total) by mouth daily.   predniSONE (DELTASONE) 20 MG tablet 4 tablets p.o. daily for 1 week followed by 3 tablets p.o. daily for 1 week  followed by 2 tablet p.o. daily for 1 week followed by 1 tablet p.o. daily for 1 week followed by half a tablet p.o. daily for 1 week   rivaroxaban (XARELTO) 20 MG TABS tablet Take 1 tablet (20 mg total) by mouth daily with supper.   tamsulosin (FLOMAX) 0.4 MG CAPS capsule Take 0.4 mg by mouth at bedtime.   No current facility-administered medications on file prior to visit.     Allergies  Allergen Reactions   Carboplatin Anaphylaxis, Shortness Of Breath, Cough and Hypertension    On dose 8. Became short of breath. Symptom management was called. Received benadryl, solumedrol, Pepcid and fluids. Medication discontinued.    Klonopin [Clonazepam] Other (See Comments)    nervous   Norco [Hydrocodone-Acetaminophen] Other (See Comments)    Made pt feel jittery     Review Of Systems:  Constitutional:   No  weight loss, night sweats,  Fevers, chills,+ fatigue, or  lassitude.  HEENT:   No headaches,  Difficulty swallowing,  Tooth/dental problems, or  Sore throat,                No sneezing, itching, ear ache, nasal congestion, post nasal drip, sore to lower nose 2/2 pressure from nasal cannula  CV:  No chest pain,  Orthopnea, PND,+  swelling in lower extremities, No anasarca, dizziness, palpitations, syncope.   GI  No heartburn, indigestion, abdominal pain, nausea, vomiting, diarrhea, change in bowel habits, loss of appetite, bloody stools.   Resp: ++ shortness of breath with exertion or at rest.  No excess mucus, no productive cough,  No non-productive cough,  No coughing up of blood.  No change in color of mucus.  + wheezing.  No chest wall deformity  Skin: no rash or lesions.  GU: no dysuria, change in color of urine, no urgency or frequency.  No flank pain, no hematuria   MS:  No joint pain or swelling.  No decreased range of motion.  No back pain.  Psych:  No change in mood or affect. No depression or anxiety.  No memory loss.   Vital Signs BP 96/76 (BP Location: Right Arm, Cuff  Size: Normal)   Pulse (!) 111   SpO2 90% Comment: 4L cont   Physical Exam:  General- No distress,  A&Ox3, pleasant ENT: No sinus tenderness, TM clear, pale nasal mucosa, no oral exudate,no post nasal drip, no  LAN Cardiac: S1, S2, regular rate and rhythm, no murmur Chest: + wheeze/ No rales/ dullness; + mild  accessory muscle use, no nasal flaring, no sternal retractions, decreased breath sounds RLL Abd.: Soft Non-tender, ND, BS +,  Ext: No clubbing cyanosis, 1+ BLE edema, no obvious deformities Neuro:  normal strength, MAE x 4, A&O x 3 Skin: No rashes, warm and dry, small nasal septum breakdown Psych: normal mood and behavior   Assessment/Plan Recurrent non-small cell lung cancer in former smoker Recent Hospital Admission for Pneumonitis / pneumonia ? If [facility] [name] may have contributed to immunotherapy mediated pneumonitis. ( On hold after 8 cycles of current therapy) New ill-defined ground-glass densities throughout the right lung, resolution of previously noted densities in left lung, but additional new left lung infiltrates. Acute on Chronic respiratory Failure with hypoxia Plan Continue the Doxycycline Dr. Julien [name] prescribed 02/05/2022. Continue the prednisone taper started by Dr. Julien [name] 02/05/2022.  Follow up with Dr. Julien [name] 02/19/2022 as is scheduled.  You need to wear continuous oxygen , as you need more oxygen flow than the portable unit can provide.  Please make sure your oxygen saturations are > 88% at all times.  Increase oxygen as needed to maintain oxygen at > 88%. Mucinex 1200 mg daily with a full glass of water.  We will place an order for small oxygen tanks to replace the portable oxygen you have now.  We will request nasal cannula that is more like those in the hospital for home use. You can try Duodem dressings to the area on your nose that are so sore.  Continue to use neosporin to nose sore.  Call for any signs of infection Please use the albuterol treatments BID  without fail while you are recovering from pneumonia.  Add Mucinex 1200 mg daily with a full glass if water to thin secretions Flutter valve 4 blows 4-6 times daily while awake.  Follow up in 2 weeks with Judson Roch NP , or sooner if needed. Please contact office for sooner follow up if symptoms do not improve or worsen or seek emergency care   Reviewed return to Hospital;/ Seek emergency care parameters with patient wife.   I spent 45 minutes dedicated to the care of this patient on the date of this encounter to include pre-visit review of records, face-to-face time with the patient discussing conditions above, post visit ordering of testing, clinical documentation with the electronic health record, making appropriate referrals as documented, and communicating necessary information to the patient's healthcare team.    Magdalen Spatz, NP 02/06/2022  1:48 PM

## 2022-02-08 ENCOUNTER — Other Ambulatory Visit: Payer: Self-pay

## 2022-02-09 ENCOUNTER — Other Ambulatory Visit: Payer: Self-pay

## 2022-02-10 ENCOUNTER — Encounter: Payer: Self-pay | Admitting: Internal Medicine

## 2022-02-10 ENCOUNTER — Encounter: Payer: Self-pay | Admitting: Physician Assistant

## 2022-02-12 ENCOUNTER — Encounter: Payer: Self-pay | Admitting: Physician Assistant

## 2022-02-12 ENCOUNTER — Encounter: Payer: Self-pay | Admitting: Internal Medicine

## 2022-02-16 ENCOUNTER — Telehealth: Payer: Self-pay | Admitting: Internal Medicine

## 2022-02-16 NOTE — Telephone Encounter (Signed)
Called patient regarding upcoming January-March appointments, patient has been called and notified.

## 2022-02-17 NOTE — Progress Notes (Signed)
[facility] OFFICE PROGRESS NOTE  Farris Has, MD [address] [place] [address]  DIAGNOSIS: 1) recurrent non-small cell lung cancer initially diagnosed as stage IIIc (T4, N3, M0) non-small cell lung cancer, adenocarcinoma presented with large right hilar mass in addition to bilateral hilar and mediastinal lymphadenopathy diagnosed in December 2020.  The patient has evidence for disease recurrence in October 2022. 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  Started on Xarelto on March 17, 2020 and currently 20 mg p.o. daily.  PRIOR THERAPY: 1) Weekly concurrent chemoradiation with carboplatin for an AUC of 2 and paclitaxel 45 mg/m2. First dose starting 01/30/2019.   Status post 6 cycles. 2) Consolidation immunotherapy with Imfinzi 1500 mg IV every 4 weeks. First dose expected on 04/19/2019.  Status post 13 cycles. 3) disease recurrence in October 2022.  CURRENT THERAPY:  Systemic chemotherapy with carboplatin for AUC of 5, Alimta 500 Mg/M2 and Keytruda 200 Mg IV every 3 weeks.  First dose January 01, 2021.  Status post 18 cycles.  He had hypersensitivity reaction to carboplatin and this was discontinued after cycle #2.  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   INTERVAL HISTORY: Philip [name] 67 y.o. male returns to the clinic today for a follow-up visit accompanied by his daughter.  The patient was last seen by Dr. Arbutus Ped on 02/05/2022.  The patient had a prolonged hospitalization in December or possible pneumonia/pneumonitis.  When the patient was seen by Dr. Arbutus Ped on 02/05/2022 the patient was having worsening shortness of breath.  Dr. Arbutus Ped ordered a CTA which showed new ill-defined groundglass density throughout the right lung which was concerning for ongoing infection or inflammatory  pneumonitis.  Dr. Arbutus Ped started the patient on a high-dose prednisone taper and antibiotics with doxycycline.  The patient followed the following day with pulmonary medicine who recommended continuing this in addition to inhalers and flutter valve.  The patient is expected to follow-up with pulmonary medicine on 02/27/22.   The patient is currently on 40 mg of prednisone.   Since last being seen, the patient feels fair. He is still on supplemental oxygen and is not back to his baseline but he reports he thinks his shortness of breath is better than it was prior and he notices he recovers his breath quicker after exertion compared to prior. He has a pulse oximeter at home. He uses 4 L of supplemental oxygen with exertion, and less when not exerting. He coughs very little. He states he is trying to use his breathing exercises to try to produce a cough and get the phlegm up. He started taking mucinex yesterday. He denies any fever or chills. He gained a few pounds from the steroids and is using compression stockings, elevating his legs, and avoiding salty food for some lower extremity swelling.   He denies any chest pain or hemoptysis.  Denies any recent nausea, vomiting, diarrhea, or constipation.  Denies any headaches.  He is here today for evaluation repeat blood work before considering resuming his treatment with single agent chemotherapy with Alimta.   MEDICAL HISTORY: Past Medical History:  Diagnosis Date   COPD (chronic obstructive pulmonary disease) (HCC)    GERD (gastroesophageal reflux disease)    Headache    migraines   Hyperlipidemia    lung ca dx'd 12/2018   Pneumonia  Pulmonary embolus (HCC)    Tobacco abuse     ALLERGIES:  is allergic to carboplatin, klonopin [clonazepam], and norco [hydrocodone-acetaminophen].  MEDICATIONS:  Current Outpatient Medications  Medication Sig Dispense Refill   acetaminophen (TYLENOL) 500 MG tablet Take 500-1,000 mg by mouth every 6 (six) hours as  needed (pain.).     albuterol (PROVENTIL) (2.5 MG/3ML) 0.083% nebulizer solution Take 3 mLs (2.5 mg total) by nebulization every 4 (four) hours as needed for wheezing or shortness of breath. 75 mL 2   albuterol (VENTOLIN HFA) 108 (90 Base) MCG/ACT inhaler Inhale 2 puffs into the lungs every 4 (four) hours as needed for wheezing or shortness of breath. 18 g 11   atorvastatin (LIPITOR) 20 MG tablet Take 20 mg by mouth daily.     Budeson-Glycopyrrol-Formoterol (BREZTRI AEROSPHERE) 160-9-4.8 MCG/ACT AERO Inhale 2 puffs into the lungs in the morning and at bedtime. 1 each 3   dextromethorphan-guaiFENesin (MUCINEX DM) 30-600 MG 12hr tablet Take 1 tablet by mouth 2 (two) times daily as needed for cough. 40 tablet 0   doxycycline (VIBRA-TABS) 100 MG tablet Take 1 tablet (100 mg total) by mouth 2 (two) times daily. 30 tablet 0   famotidine (PEPCID) 20 MG tablet TAKE 1 TABLET BY MOUTH TWICE A DAY 180 tablet 1   folic acid (FOLVITE) 1 MG tablet TAKE 1 TABLET BY MOUTH EVERY DAY (Patient taking differently: Take 1 mg by mouth every evening.) 90 tablet 1   guaiFENesin (MUCINEX) 600 MG 12 hr tablet Take 2 tablets (1,200 mg total) by mouth 2 (two) times daily as needed. 60 tablet 0   ipratropium-albuterol (DUONEB) 0.5-2.5 (3) MG/3ML SOLN Take 3 mLs by nebulization every 4 (four) hours as needed. 360 mL 1   midodrine (PROAMATINE) 5 MG tablet Take 1 tablet (5 mg total) by mouth 3 (three) times daily with meals. 90 tablet 0   midodrine (PROAMATINE) 5 MG tablet 1 tablet with a meal Orally three times a day     Multiple Vitamins-Minerals (MULTI ADULT GUMMIES) CHEW Chew 1 tablet by mouth in the morning and at bedtime.     naphazoline-pheniramine (VISINE) 0.025-0.3 % ophthalmic solution Place 1 drop into both eyes daily as needed for eye irritation.     pantoprazole (PROTONIX) 40 MG tablet Take 1 tablet (40 mg total) by mouth daily. 30 tablet 1   predniSONE (DELTASONE) 20 MG tablet 4 tablets p.o. daily for 1 week followed  by 3 tablets p.o. daily for 1 week followed by 2 tablet p.o. daily for 1 week followed by 1 tablet p.o. daily for 1 week followed by half a tablet p.o. daily for 1 week 75 tablet 0   rivaroxaban (XARELTO) 20 MG TABS tablet Take 1 tablet (20 mg total) by mouth daily with supper. 30 tablet 11   tamsulosin (FLOMAX) 0.4 MG CAPS capsule Take 0.4 mg by mouth at bedtime.     No current facility-administered medications for this visit.    SURGICAL HISTORY:  Past Surgical History:  Procedure Laterality Date   COLONOSCOPY     CYSTOSCOPY W/ URETERAL STENT PLACEMENT Right 10/24/2021   Procedure: CYSTOSCOPY WITH RETROGRADE PYELOGRAM/URETERAL STENT PLACEMENT;  Surgeon: Jannifer Hick, MD;  Location: [facility];  Service: Urology;  Laterality: Right;   HAND SURGERY     VIDEO BRONCHOSCOPY WITH ENDOBRONCHIAL ULTRASOUND N/A 12/22/2018   Procedure: VIDEO BRONCHOSCOPY WITH ENDOBRONCHIAL ULTRASOUND;  Surgeon: Josephine Igo, DO;  Location: [facility];  Service: Thoracic;  Laterality: N/A;   VIDEO  BRONCHOSCOPY WITH ENDOBRONCHIAL ULTRASOUND N/A 01/09/2019   Procedure: VIDEO BRONCHOSCOPY WITH ENDOBRONCHIAL ULTRASOUND WITH FLUORO;  Surgeon: Josephine Igo, DO;  Location: [facility];  Service: Thoracic;  Laterality: N/A;   VIDEO BRONCHOSCOPY WITH RADIAL ENDOBRONCHIAL ULTRASOUND N/A 12/22/2018   Procedure: RADIAL ENDOBRONCHIAL ULTRASOUND;  Surgeon: Josephine Igo, DO;  Location: [facility];  Service: Thoracic;  Laterality: N/A;    REVIEW OF SYSTEMS:   Review of Systems  Constitutional: Positive for weight gain. Negative for appetite change, chills, fatigue, and fever.  HENT: Negative for mouth sores, nosebleeds, sore throat and trouble swallowing.   Eyes: Negative for eye problems and icterus.  Respiratory: Positive for shortness of breath. Negative for cough, hemoptysis, and wheezing.   Cardiovascular: Positive for bilateral lower extremity swelling. Negative for chest pain.  Gastrointestinal: Negative for abdominal pain,  constipation, diarrhea, nausea and vomiting.  Genitourinary: Negative for bladder incontinence, difficulty urinating, dysuria, frequency and hematuria.   Musculoskeletal: Negative for back pain, gait problem, neck pain and neck stiffness.  Skin: Negative for itching and rash.  Neurological: Negative for dizziness, extremity weakness, gait problem, headaches, light-headedness and seizures.  Hematological: Negative for adenopathy. Does not bruise/bleed easily.  Psychiatric/Behavioral: Negative for confusion, depression and sleep disturbance. The patient is not nervous/anxious.     PHYSICAL EXAMINATION:  Blood pressure 112/88, pulse (!) 105, temperature (!) 97.5 F (36.4 C), temperature source Oral, resp. rate 18, weight 192 lb 8 oz (87.3 kg), SpO2 93 %.  ECOG PERFORMANCE STATUS: 1  Physical Exam  Constitutional: Oriented to person, place, and time and well-developed, well-nourished, and in no distress.  HENT:  Head: Normocephalic and atraumatic.  Mouth/Throat: Oropharynx is clear and moist. No oropharyngeal exudate.  Eyes: Conjunctivae are normal. Right eye exhibits no discharge. Left eye exhibits no discharge. No scleral icterus.  Neck: Normal range of motion. Neck supple.  Cardiovascular: Tachycardic, regular rhythm, normal heart sounds and intact distal pulses.   Pulmonary/Chest: Effort normal and breath sounds normal. No respiratory distress. No wheezes. No rales. On 4 L supplemental oxygen.  Abdominal: Soft. Bowel sounds are normal. Exhibits no distension and no mass. There is no tenderness.  Musculoskeletal: Normal range of motion. Positive for bilateral lower extremity swelling.  Lymphadenopathy:    No cervical adenopathy.  Neurological: Alert and oriented to person, place, and time. Exhibits normal muscle tone. Gait normal. Coordination normal.  Skin: Skin is warm and dry. No rash noted. Not diaphoretic. No erythema. No pallor.  Psychiatric: Mood, memory and judgment normal.   Vitals reviewed.  LABORATORY DATA: Lab Results  Component Value Date   WBC 16.7 (H) 02/19/2022   HGB 14.3 02/19/2022   HCT 42.4 02/19/2022   MCV 92.6 02/19/2022   PLT 217 02/19/2022      Chemistry      Component Value Date/Time   NA 136 02/19/2022 1353   K 3.7 02/19/2022 1353   CL 100 02/19/2022 1353   CO2 27 02/19/2022 1353   BUN 23 02/19/2022 1353   CREATININE 1.09 02/19/2022 1353      Component Value Date/Time   CALCIUM 9.3 02/19/2022 1353   ALKPHOS 68 02/19/2022 1353   AST 20 02/19/2022 1353   ALT 26 02/19/2022 1353   BILITOT 0.5 02/19/2022 1353       RADIOGRAPHIC STUDIES:  CT Angio Chest Pulmonary Embolism (PE) W or WO Contrast  Result Date: 02/05/2022 CLINICAL DATA:  Significant shortness of breath and dyspnea on exertion. History of lung cancer and pulmonary embolism. EXAM: CT ANGIOGRAPHY  CHEST WITH CONTRAST TECHNIQUE: Multidetector CT imaging of the chest was performed using the standard protocol during bolus administration of intravenous contrast. Multiplanar CT image reconstructions and MIPs were obtained to evaluate the vascular anatomy. RADIATION DOSE REDUCTION: This exam was performed according to the departmental dose-optimization program which includes automated exposure control, adjustment of the mA and/or kV according to patient size and/or use of iterative reconstruction technique. CONTRAST:  75mL OMNIPAQUE IOHEXOL 350 MG/ML SOLN COMPARISON:  CT chest dated January 07, 2022. FINDINGS: Cardiovascular: Satisfactory opacification of the pulmonary arteries to the segmental level. No evidence of pulmonary embolism. 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 Normal heart size. No pericardial effusion. No thoracic aortic aneurysm or dissection. Mild atherosclerotic calcification of the aortic arch. Mediastinum/Nodes: Mediastinal lymph nodes measuring up to 1.3 cm are unchanged. No enlarged axillary lymph nodes. The thyroid  gland, trachea, and esophagus demonstrate no significant findings. Lungs/Pleura: New ill-defined ground-glass densities throughout the right lung. Some of the previously noted ill-defined ground-glass densities in the left upper and lower lobes have resolved, but there are some new ground-glass densities as well, for example in the peripheral left upper lobe (series 6, image 60). Increased small right pleural effusion. Similar bilateral parahilar post radiation changes and bronchiectasis. No pneumothorax. Severe emphysema again noted. Upper Abdomen: No acute abnormality. Unchanged bilateral adrenal adenomas. No follow-up imaging is recommended. Musculoskeletal: No chest wall abnormality. No acute or significant osseous findings. Review of the MIP images confirms the above findings. IMPRESSION: 1. No evidence of pulmonary embolism. 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 2. New ill-defined ground-glass densities throughout the right lung. Some of the previously noted ill-defined ground-glass densities in the left lung have resolved, but there are some new ground-glass densities as well. Findings remain concerning for ongoing infectious or inflammatory pneumonitis. 3. Increased small right pleural effusion. 4. Similar bilateral parahilar post radiation fibrosis. 5. Aortic Atherosclerosis (ICD10-I70.0) and Emphysema (ICD10-J43.9). These results will be called to the ordering clinician or representative by the Radiologist Assistant, and communication documented in the PACS or [employer]. Electronically Signed   By: Titus Dubin M.D.   On: 02/05/2022 13:34     ASSESSMENT/PLAN:  This is a very pleasant 67 year old Caucasian male initially diagnosed with stage IIIc non-small cell lung cancer, adenocarcinoma.  He was diagnosed in 2020. Molecular studies negative for any actionable mutations.  He was found to have disease recurrence in October 2022. No actionable  mutations by guardant 360.    The patient initially completed concurrent chemoradiation with weekly carboplatin for an AUC of 2 and paclitaxel 45 mg per metered squared.  He status post 6 cycles.  He tolerated this well with a partial response.   He then underwent consolidation immunotherapy with Imfinzi 1500 mg IV every 4 weeks.  He status post 13 cycles.    In September 2022, the patient scan showed an increase in the size of the soft tissue nodule in the right middle lobe adjacent to the post radiation changes which is highly suspicious for disease recurrence.  He also has a soft tissue of the right hilum/azygoesophageal recess increased compared to remote prior studies.    The patient a PET scan that showed hypermetabolic nodule in the right lower lobe adjacent to the post radiation treatment site is increased consistent with lung cancer recurrence.  There is also a second hypermetabolic nodule in the right lower lobe above the diaphragm concerning for recurrence  and hypermetabolic contralateral lymph node in the high left paratracheal mediastinum also concerning for metastatic adenopathy recurrence.   Dr. Julien Nordmann arranged for the patient to have guardant 360 molecular testing performed which showed no actionable mutations.   Therefore, Dr. Julien Nordmann recommend the patient start palliative systemic chemotherapy with carboplatin for an AUC of 5, Alimta 500 mg per metered square, Keytruda 200 mg IV every 3 weeks. He is status post 18 cycles of treatment. Starting from cycle #5, the patient started maintenance alimta 500 mg/m2 and Keytruda 200 mg IV every 3 weeks.  [name] was discontinued starting from cycle #9 due to possible pneumonitis.   The patient was hospitalized for several weeks in December 2023 for pneumonia and pneumonitis.  When the patient was last seen by Dr. Julien Nordmann on 02/05/2022 the patient had a CT angiogram which showed some areas of resolved densities but some new areas which are  suspicious for infectious/inflammatory changes.  The patient is currently on a high-dose prednisone taper and he is currently taking 40 milligrams.  Dr. Julien Nordmann also gave him doxycycline for which he completed.  He saw pulmonary medicine on 02/06/2022. He is following back up on 02/27/22  The patient was seen Dr. Julien Nordmann today.  Labs were reviewed.  Recommend that her resume his single agent chemotherapy with Alimta with cycle #19 today scheduled.  However, the patient was cautioned that should he develop any new or worsening symptoms in the interval such as fevers, chills, hypoxia, shortness of breath, worsening cough, etc to be evaluated immediately.   We will see him back for follow-up visit in 3 weeks for evaluation before starting cycle #20.  He is currently on Xarelto for his history of PE.  He will continue his prednisone taper.   The patient was advised to call immediately if she has any concerning symptoms in the interval. The patient voices understanding of current disease status and treatment options and is in agreement with the current care plan. All questions were answered. The patient knows to call the clinic with any problems, questions or concerns. We can certainly see the patient much sooner if necessary   No orders of the defined types were placed in this encounter.    Inara Dike L Redonna Wilbert, [name] 02/19/22  ADDENDUM: Hematology/Oncology Attending: I had a face-to-face encounter with the patient today.  I reviewed his record, lab and recommended his care plan.  The patient came to the clinic today for follow-up visit accompanied by his daughter Mickel Baas.  This is a very pleasant 67 years old white male with  initially diagnosed with stage IIIc non-small cell lung cancer, adenocarcinoma.  He was diagnosed in 2020. Molecular studies negative for any actionable mutations.  He was found to have disease recurrence in October 2022. No actionable mutations by guardant 360.   Pleated a course of concurrent chemoradiation with weekly carboplatin and paclitaxel followed by 1 year treatment with consolidation immunotherapy with Imfinzi. In September 2022, he had evidence for disease progression and he started systemic chemotherapy with induction carboplatin, Alimta and Keytruda for 4 cycles followed by maintenance treatment with Alimta and Keytruda for 4 more cycles before Keytruda was discontinued secondary to suspicious immunotherapy mediated pneumonitis.  The patient is status post a total of 18 cycles of treatment including the last 10 cycles with maintenance Alimta. He has been tolerating this treatment well but he was recently admitted to the hospital with multifocal pneumonia and treated with a course of antibiotics. I also treated him 2 weeks ago  with a course of doxycycline as well as a tapered dose of prednisone.  He is feeling much better today but still have some shortness of breath with exertion. I recommended for the patient to resume his maintenance treatment with Alimta and he will proceed with cycle #19 today. He will continue with the taper dose of prednisone. The patient will come back for follow-up visit in 3 weeks for evaluation before the next cycle of his treatment. He was advised to call immediately if he has any concerning symptoms in the interval. The total time spent in the appointment was 30 minutes. Disclaimer: This note was dictated with voice recognition software. Similar sounding words can inadvertently be transcribed and may be missed upon review. Lajuana Matte, MD

## 2022-02-19 ENCOUNTER — Encounter: Payer: Self-pay | Admitting: Physician Assistant

## 2022-02-19 ENCOUNTER — Inpatient Hospital Stay: Payer: No Typology Code available for payment source

## 2022-02-19 ENCOUNTER — Encounter: Payer: Self-pay | Admitting: Internal Medicine

## 2022-02-19 ENCOUNTER — Inpatient Hospital Stay (HOSPITAL_BASED_OUTPATIENT_CLINIC_OR_DEPARTMENT_OTHER): Payer: No Typology Code available for payment source | Admitting: Physician Assistant

## 2022-02-19 VITALS — BP 112/88 | HR 105 | Temp 97.5°F | Resp 18 | Wt 192.5 lb

## 2022-02-19 DIAGNOSIS — Z923 Personal history of irradiation: Secondary | ICD-10-CM | POA: Insufficient documentation

## 2022-02-19 DIAGNOSIS — Z86711 Personal history of pulmonary embolism: Secondary | ICD-10-CM | POA: Insufficient documentation

## 2022-02-19 DIAGNOSIS — Z86718 Personal history of other venous thrombosis and embolism: Secondary | ICD-10-CM | POA: Insufficient documentation

## 2022-02-19 DIAGNOSIS — C3490 Malignant neoplasm of unspecified part of unspecified bronchus or lung: Secondary | ICD-10-CM

## 2022-02-19 DIAGNOSIS — E785 Hyperlipidemia, unspecified: Secondary | ICD-10-CM | POA: Insufficient documentation

## 2022-02-19 DIAGNOSIS — J440 Chronic obstructive pulmonary disease with acute lower respiratory infection: Secondary | ICD-10-CM | POA: Diagnosis not present

## 2022-02-19 DIAGNOSIS — C3401 Malignant neoplasm of right main bronchus: Secondary | ICD-10-CM | POA: Diagnosis not present

## 2022-02-19 DIAGNOSIS — N1330 Unspecified hydronephrosis: Secondary | ICD-10-CM | POA: Diagnosis not present

## 2022-02-19 DIAGNOSIS — K219 Gastro-esophageal reflux disease without esophagitis: Secondary | ICD-10-CM | POA: Diagnosis not present

## 2022-02-19 DIAGNOSIS — Z79899 Other long term (current) drug therapy: Secondary | ICD-10-CM | POA: Insufficient documentation

## 2022-02-19 DIAGNOSIS — C3491 Malignant neoplasm of unspecified part of right bronchus or lung: Secondary | ICD-10-CM | POA: Diagnosis not present

## 2022-02-19 DIAGNOSIS — Z5111 Encounter for antineoplastic chemotherapy: Secondary | ICD-10-CM | POA: Diagnosis present

## 2022-02-19 DIAGNOSIS — J90 Pleural effusion, not elsewhere classified: Secondary | ICD-10-CM | POA: Insufficient documentation

## 2022-02-19 DIAGNOSIS — C778 Secondary and unspecified malignant neoplasm of lymph nodes of multiple regions: Secondary | ICD-10-CM | POA: Insufficient documentation

## 2022-02-19 DIAGNOSIS — Z7901 Long term (current) use of anticoagulants: Secondary | ICD-10-CM | POA: Insufficient documentation

## 2022-02-19 LAB — CBC WITH DIFFERENTIAL (CANCER CENTER ONLY)
Abs Immature Granulocytes: 0.21 10*3/uL — ABNORMAL HIGH (ref 0.00–0.07)
Basophils Absolute: 0 10*3/uL (ref 0.0–0.1)
Basophils Relative: 0 %
Eosinophils Absolute: 0 10*3/uL (ref 0.0–0.5)
Eosinophils Relative: 0 %
HCT: 42.4 % (ref 39.0–52.0)
Hemoglobin: 14.3 g/dL (ref 13.0–17.0)
Immature Granulocytes: 1 %
Lymphocytes Relative: 5 %
Lymphs Abs: 0.9 10*3/uL (ref 0.7–4.0)
MCH: 31.2 pg (ref 26.0–34.0)
MCHC: 33.7 g/dL (ref 30.0–36.0)
MCV: 92.6 fL (ref 80.0–100.0)
Monocytes Absolute: 0.8 10*3/uL (ref 0.1–1.0)
Monocytes Relative: 5 %
Neutro Abs: 14.7 10*3/uL — ABNORMAL HIGH (ref 1.7–7.7)
Neutrophils Relative %: 89 %
Platelet Count: 217 10*3/uL (ref 150–400)
RBC: 4.58 MIL/uL (ref 4.22–5.81)
RDW: 15 % (ref 11.5–15.5)
WBC Count: 16.7 10*3/uL — ABNORMAL HIGH (ref 4.0–10.5)
nRBC: 0 % (ref 0.0–0.2)

## 2022-02-19 LAB — CMP (CANCER CENTER ONLY)
ALT: 26 U/L (ref 0–44)
AST: 20 U/L (ref 15–41)
Albumin: 3.3 g/dL — ABNORMAL LOW (ref 3.5–5.0)
Alkaline Phosphatase: 68 U/L (ref 38–126)
Anion gap: 9 (ref 5–15)
BUN: 23 mg/dL (ref 8–23)
CO2: 27 mmol/L (ref 22–32)
Calcium: 9.3 mg/dL (ref 8.9–10.3)
Chloride: 100 mmol/L (ref 98–111)
Creatinine: 1.09 mg/dL (ref 0.61–1.24)
GFR, Estimated: >60 mL/min (ref >60)
Glucose, Bld: 173 mg/dL — ABNORMAL HIGH (ref 70–99)
Potassium: 3.7 mmol/L (ref 3.5–5.1)
Sodium: 136 mmol/L (ref 135–145)
Total Bilirubin: 0.5 mg/dL (ref 0.3–1.2)
Total Protein: 6.3 g/dL — ABNORMAL LOW (ref 6.5–8.1)

## 2022-02-19 MED ORDER — SODIUM CHLORIDE 0.9 % IV SOLN: Freq: Once | INTRAVENOUS | Status: AC

## 2022-02-19 MED ORDER — PROCHLORPERAZINE MALEATE 10 MG PO TABS
10.0000 mg | ORAL_TABLET | Freq: Once | ORAL | Status: AC
  Administered 2022-02-19: 10 mg via ORAL
  Filled 2022-02-19: qty 1

## 2022-02-19 MED ORDER — SODIUM CHLORIDE 0.9 % IV SOLN
500.0000 mg/m2 | Freq: Once | INTRAVENOUS | Status: AC
  Administered 2022-02-19: 1000 mg via INTRAVENOUS
  Filled 2022-02-19: qty 40

## 2022-02-19 NOTE — Progress Notes (Signed)
Per Cassie Heilingoetter, PA, ok to treat with elevated HR

## 2022-02-19 NOTE — Patient Instructions (Signed)
[facility] MEDICAL ONCOLOGY  Discharge Instructions: Thank you for choosing [facility] to provide your oncology and hematology care.   If you have a lab appointment with the Cancer Center, please go directly to the Cancer Center and check in at the registration area.   Wear comfortable clothing and clothing appropriate for easy access to any Portacath or PICC line.   We strive to give you quality time with your provider. You may need to reschedule your appointment if you arrive late (15 or more minutes).  Arriving late affects you and other patients whose appointments are after yours.  Also, if you miss three or more appointments without notifying the office, you may be dismissed from the clinic at the provider's discretion.      For prescription refill requests, have your pharmacy contact our office and allow 72 hours for refills to be completed.    Today you received the following chemotherapy and/or immunotherapy agents pemetrexed      To help prevent nausea and vomiting after your treatment, we encourage you to take your nausea medication as directed.  BELOW ARE SYMPTOMS THAT SHOULD BE REPORTED IMMEDIATELY: *FEVER GREATER THAN 100.4 F (38 C) OR HIGHER *CHILLS OR SWEATING *NAUSEA AND VOMITING THAT IS NOT CONTROLLED WITH YOUR NAUSEA MEDICATION *UNUSUAL SHORTNESS OF BREATH *UNUSUAL BRUISING OR BLEEDING *URINARY PROBLEMS (pain or burning when urinating, or frequent urination) *BOWEL PROBLEMS (unusual diarrhea, constipation, pain near the anus) TENDERNESS IN MOUTH AND THROAT WITH OR WITHOUT PRESENCE OF ULCERS (sore throat, sores in mouth, or a toothache) UNUSUAL RASH, SWELLING OR PAIN  UNUSUAL VAGINAL DISCHARGE OR ITCHING   Items with * indicate a potential emergency and should be followed up as soon as possible or go to the Emergency Department if any problems should occur.  Please show the CHEMOTHERAPY ALERT CARD or IMMUNOTHERAPY ALERT CARD at check-in to  the Emergency Department and triage nurse.  Should you have questions after your visit or need to cancel or reschedule your appointment, please contact [facility]  Dept: [phone]  and follow the prompts.  Office hours are 8:00 a.m. to 4:30 p.m. Monday - Friday. Please note that voicemails left after 4:00 p.m. may not be returned until the following business day.  We are closed weekends and major holidays. You have access to a nurse at all times for urgent questions. Please call the main number to the clinic Dept: [phone] and follow the prompts.   For any non-urgent questions, you may also contact your provider using MyChart. We now offer e-Visits for anyone 58 and older to request care online for non-urgent symptoms. For details visit [email or web address].   Also download the MyChart app! Go to the app store, search "MyChart", open the app, select [place], and log in with your MyChart username and password.

## 2022-02-26 ENCOUNTER — Telehealth: Payer: Self-pay | Admitting: Acute Care

## 2022-02-26 NOTE — Telephone Encounter (Signed)
PT calling. Needs a note that it is OK for him to ret to work. Pls call @ [phone]   He states he can come by and pick it up.

## 2022-02-26 NOTE — Telephone Encounter (Signed)
OK to put on [facility]

## 2022-02-26 NOTE — Telephone Encounter (Signed)
Sarah, please advise if you believe it is okay for a letter to be written to allow pt to return back to work.

## 2022-02-27 ENCOUNTER — Ambulatory Visit (INDEPENDENT_AMBULATORY_CARE_PROVIDER_SITE_OTHER): Payer: No Typology Code available for payment source | Admitting: Acute Care

## 2022-02-27 ENCOUNTER — Encounter: Payer: Self-pay | Admitting: Acute Care

## 2022-02-27 VITALS — BP 98/68 | HR 116 | Temp 98.0°F | Ht 66.6 in | Wt 188.0 lb

## 2022-02-27 DIAGNOSIS — J181 Lobar pneumonia, unspecified organism: Secondary | ICD-10-CM | POA: Diagnosis not present

## 2022-02-27 DIAGNOSIS — J209 Acute bronchitis, unspecified: Secondary | ICD-10-CM

## 2022-02-27 DIAGNOSIS — J440 Chronic obstructive pulmonary disease with acute lower respiratory infection: Secondary | ICD-10-CM | POA: Diagnosis not present

## 2022-02-27 DIAGNOSIS — I959 Hypotension, unspecified: Secondary | ICD-10-CM

## 2022-02-27 DIAGNOSIS — J9621 Acute and chronic respiratory failure with hypoxia: Secondary | ICD-10-CM

## 2022-02-27 MED ORDER — MIDODRINE HCL 5 MG PO TABS: 5.0000 mg | ORAL_TABLET | Freq: Three times a day (TID) | ORAL | 3 refills | Status: DC

## 2022-02-27 NOTE — Progress Notes (Signed)
History of Present Illness Philip [name] is a 67 y.o. male  former smoker  ( Quit 1 month  ago) with a  98 pack year smoking history with recurrent  non-small cell lung cancer    Synopsis 67 year old patient with recurrent  non-small cell lung cancer initially diagnosed as stage IIIc (T4, N3, M0) non-small cell lung cancer, adenocarcinoma . Originally diagnosed as a large right hilar mass in addition to bilateral hilar and mediastinal lymphadenopathy diagnosed in December 2020. The patient has evidence for disease recurrence in October 2022. 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 Started on Xarelto on March 17, 2020 and currently 20 mg p.o. daily.    He was admitted to the hospital recently, 01/12/2022-01/22/2022 and treated for suspicious pneumonia as well as pneumonitis. Prior to hospitalization he was not wearing oxygen. He continues to work. He was treated with an aggressive course of antibiotics as well as steroids. He was sent home from the hospital with levaquin and prednisone taper. He is currently on prednisone 5 mg p.o. daily. He has 2 days of prednisone left. He completed the Levaquin dosing 01/29/22.  He was seen by Dr. Silas [name] during his hospitalization. He is followed by Dr. Shearon [name] with [facility].   Dr. Earlie [name] did a CTA Chest 02/05/2022 as he was concerned about possible PE despite anticoagulation treatment. This scan was negative for PE but showed New ill-defined ground-glass densities throughout the right lung. Some of the previously noted ill-defined ground-glass densities in the left upper and lower lobes had resolved, but there are some new ground-glass densities as well  an  Increased small right pleural effusion and bilateral parahilar post radiation fibrosis.There was concern for  ongoing infectious inflammatory pneumonitis. Dr. Earlie [name] started  the patient on a high dose long prednisone taper as well as Doxycycline x 15 days on 02/05/2022. He is compliant with this. He understands he needs to complete treatment of both medications.   The patient presented 02/06/2022  wearing his POA which provides pulsed oxygen. His saturations were 83%, which he states he is able to tolerate well. I had a long discussion with him about while he may feel like he can tolerate hypoxia, it take a real toll on his heart and brain. We have told him he needs to wear continuous flow oxygen , and that his sats must always be > 88-90%. He was smoking until right before being admitted to the hospital 12/11, and has not had a cigarette since.We also discussed using albuterol nebs for his shortness of breath. He had not been using these since his discharge from the hospital. He was  asked  to resume these twice daily without fail and can increase to 3 times daily if needed. We received an albuterol treatment  in the office today, and his breath sounds improved. Pt states was not coughing up any secretions, and he does not have a cough. He was not using Mucinex, or a flutter valve.  Maintenance chemo once every 3 weeks, but nothing in 6 weeks due to hospitalization. Plan  for next infusion on 1/18 was completed. He is following up with Dr. Earlie [name].    He had been started on midodrine 5 mg daily in the hospital when he was admitted. This helped with Dizziness and low BP.    Prior Therapy 1) Weekly concurrent chemoradiation with carboplatin for an AUC  of 2 and paclitaxel 45 mg/m2. First dose starting 01/30/2019.   Status post 6 cycles. 2) Consolidation immunotherapy with Imfinzi 1500 mg IV every 4 weeks. First dose expected on 04/19/2019.  Status post 13 cycles. 3) disease recurrence in October 2022.   CURRENT THERAPY: Systemic chemotherapy with carboplatin for AUC of 5, Alimta 500 Mg/M2 and Keytruda 200 Mg IV every 3 weeks.  First dose January 01, 2021.  Status post 18  cycles.  He had hypersensitivity reaction to carboplatin and this was discontinued after cycle #2.  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   02/27/2022 Pt. Presents for follow up after pneumonia. He was last seen in the office 02/06/2022.He states he has been feeling better. No fever, no discolored secretions. No cough. He does still have dyspnea on exertion.  He is wearing his continuous oxygen at 3 L [place]. He states he is monitoring his oxt=ygen saturations. He has not been using his Breztri daily. I have reminded him he needs to use this.  He states he wants a letter to return to work. He needs a follow up CT Chest to ensure the pneumonia and infiltrates have resolved. 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 He verbalized understanding and agrees to do both. He no longer has any more  midodrine. His prescription from the hospital has been completed. He endorses diizziness. BP is 98/68 in the office today. He is on Lovenox and at risk for bleed with falls. He states he did feel better on the medication. I will restart his TID dosing of midodrine and have him check his BP at home twice daily.I have asked him to stop medication if BP is > 433 systolic. He verbalized understanding. He is down to his last dose of prednisone.He is glad as he has gained weight and states he is hungry all the time.  He did Restart chemo 02/19/2022.   He does have significant physical deconditioning after his recent pneumonia. I have talked with him about pulmonary rehab. He is in agreement with this. I will place a referral.   Test Results: CTA Chest 02/05/2022 Lungs/Pleura: New ill-defined ground-glass densities throughout the right lung. Some of the previously noted ill-defined ground-glass densities in the left upper and lower lobes have resolved, but there are  some new ground-glass densities as well, for example in the peripheral left upper lobe (series 6, image 60). Increased small right pleural effusion. Similar bilateral parahilar post radiation changes and bronchiectasis. No pneumothorax. Severe emphysema again noted.   Upper Abdomen: No acute abnormality. Unchanged bilateral adrenal adenomas. No follow-up imaging is recommended.   Musculoskeletal: No chest wall abnormality. No acute or significant osseous findings.   Review of the MIP images confirms the above findings.   IMPRESSION: 1. No evidence of pulmonary embolism. 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 2. New ill-defined ground-glass densities throughout the right lung. Some of the previously noted ill-defined ground-glass densities in the left lung have resolved, but there are some new ground-glass densities as well. Findings remain concerning for ongoing infectious or inflammatory pneumonitis. 3. Increased small right pleural effusion. 4. Similar bilateral parahilar post radiation fibrosis. 5. Aortic Atherosclerosis (ICD10-I70.0) and Emphysema (ICD10-J43.9).       Latest Ref Rng & Units 02/19/2022    1:53 PM 02/05/2022  10:48 AM 01/20/2022    6:45 AM  CBC  WBC 4.0 - 10.5 K/uL 16.7  9.2  13.2   Hemoglobin 13.0 - 17.0 g/dL 14.3  13.5  12.0   Hematocrit 39.0 - 52.0 % 42.4  39.4  38.6   Platelets 150 - 400 K/uL 217  210  317        Latest Ref Rng & Units 02/19/2022    1:53 PM 02/05/2022   10:48 AM 01/20/2022    6:45 AM  BMP  Glucose 70 - 99 mg/dL 173  105  104   BUN 8 - 23 mg/dL 23  15  22    Creatinine 0.61 - 1.24 mg/dL 1.09  0.96  0.91   Sodium 135 - 145 mmol/L 136  138  138   Potassium 3.5 - 5.1 mmol/L 3.7  4.4  4.6   Chloride 98 - 111 mmol/L 100  101  100   CO2 22 - 32 mmol/L 27  28  29    Calcium 8.9 - 10.3 mg/dL 9.3  9.6  8.6     BNP    Component Value Date/Time   BNP 104.1 (H) 01/12/2022 0840     ProBNP No results found for: "PROBNP"  PFT    Component Value Date/Time   FEV1PRE 2.07 12/21/2018 1238   FEV1POST 2.28 12/21/2018 1238   FVCPRE 3.07 12/21/2018 1238   FVCPOST 3.23 12/21/2018 1238   TLC 5.35 12/21/2018 1238   DLCOUNC 15.47 12/21/2018 1238   PREFEV1FVCRT 67 12/21/2018 1238   PSTFEV1FVCRT 71 12/21/2018 1238    CT Angio Chest Pulmonary Embolism (PE) W or WO Contrast  Result Date: 02/05/2022 CLINICAL DATA:  Significant shortness of breath and dyspnea on exertion. History of lung cancer and pulmonary embolism. EXAM: CT ANGIOGRAPHY CHEST WITH CONTRAST TECHNIQUE: Multidetector CT imaging of the chest was performed using the standard protocol during bolus administration of intravenous contrast. Multiplanar CT image reconstructions and MIPs were obtained to evaluate the vascular anatomy. RADIATION DOSE REDUCTION: This exam was performed according to the departmental dose-optimization program which includes automated exposure control, adjustment of the mA and/or kV according to patient size and/or use of iterative reconstruction technique. CONTRAST:  45mL OMNIPAQUE IOHEXOL 350 MG/ML SOLN COMPARISON:  CT chest dated January 07, 2022. FINDINGS: Cardiovascular: Satisfactory opacification of the pulmonary arteries to the segmental level. No evidence of pulmonary embolism. 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 Normal heart size. No pericardial effusion. No thoracic aortic aneurysm or dissection. Mild atherosclerotic calcification of the aortic arch. Mediastinum/Nodes: Mediastinal lymph nodes measuring up to 1.3 cm are unchanged. No enlarged axillary lymph nodes. The thyroid gland, trachea, and esophagus demonstrate no significant findings. Lungs/Pleura: New ill-defined ground-glass densities throughout the right lung. Some of the previously noted ill-defined ground-glass densities in the left upper and lower lobes have resolved, but there  are some new ground-glass densities as well, for example in the peripheral left upper lobe (series 6, image 60). Increased small right pleural effusion. Similar bilateral parahilar post radiation changes and bronchiectasis. No pneumothorax. Severe emphysema again noted. Upper Abdomen: No acute abnormality. Unchanged bilateral adrenal adenomas. No follow-up imaging is recommended. Musculoskeletal: No chest wall abnormality. No acute or significant osseous findings. Review of the MIP images confirms the above findings. IMPRESSION: 1. No evidence of pulmonary embolism. 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 2. New ill-defined ground-glass densities throughout the right lung. Some of the previously noted ill-defined  ground-glass densities in the left lung have resolved, but there are some new ground-glass densities as well. Findings remain concerning for ongoing infectious or inflammatory pneumonitis. 3. Increased small right pleural effusion. 4. Similar bilateral parahilar post radiation fibrosis. 5. Aortic Atherosclerosis (ICD10-I70.0) and Emphysema (ICD10-J43.9). These results will be called to the ordering clinician or representative by the Radiologist Assistant, and communication documented in the PACS or [employer]. Electronically Signed   By: Titus Dubin M.D.   On: 02/05/2022 13:34     Past medical hx Past Medical History:  Diagnosis Date   COPD (chronic obstructive pulmonary disease) (HCC)    GERD (gastroesophageal reflux disease)    Headache    migraines   Hyperlipidemia    lung ca dx'd 12/2018   Pneumonia    Pulmonary embolus (HCC)    Tobacco abuse      Social History   Tobacco Use   Smoking status: Former    Packs/day: 2.00    Years: 45.00    Total pack years: 90.00    Types: Cigarettes    Start date: 07/04/1970    Quit date: 12/2021    Years since quitting: 0.2   Smokeless tobacco: Former    Types: Chew   Tobacco comments:     Smoking 2 packs of cigarettes a week. 10/14/2021 [place]    Smoking is on and off, will go a week without smoking then start up and smoke half a pack a day  Vaping Use   Vaping Use: Never used  Substance Use Topics   Alcohol use: Yes    Comment: hasn't drank in one month   Drug use: Not Currently    Types: Marijuana    Comment: rare edible use    Mr.Docter reports that he quit smoking about 2 months ago. His smoking use included cigarettes. He started smoking about 51 years ago. He has a 90.00 pack-year smoking history. He has quit using smokeless tobacco.  His smokeless tobacco use included chew. He reports current alcohol use. He reports that he does not currently use drugs after having used the following drugs: Marijuana.  Tobacco Cessation: Some day smoker with a 52 pack year smoking history  Past surgical hx, Family hx, Social hx all reviewed.  Current Outpatient Medications on File Prior to Visit  Medication Sig   acetaminophen (TYLENOL) 500 MG tablet Take 500-1,000 mg by mouth every 6 (six) hours as needed (pain.).   albuterol (PROVENTIL) (2.5 MG/3ML) 0.083% nebulizer solution Take 3 mLs (2.5 mg total) by nebulization every 4 (four) hours as needed for wheezing or shortness of breath.   albuterol (VENTOLIN HFA) 108 (90 Base) MCG/ACT inhaler Inhale 2 puffs into the lungs every 4 (four) hours as needed for wheezing or shortness of breath.   Budeson-Glycopyrrol-Formoterol (BREZTRI AEROSPHERE) 160-9-4.8 MCG/ACT AERO Inhale 2 puffs into the lungs in the morning and at bedtime.   dextromethorphan-guaiFENesin (MUCINEX DM) 30-600 MG 12hr tablet Take 1 tablet by mouth 2 (two) times daily as needed for cough.   famotidine (PEPCID) 20 MG tablet TAKE 1 TABLET BY MOUTH TWICE A DAY   folic acid (FOLVITE) 1 MG tablet TAKE 1 TABLET BY MOUTH EVERY DAY (Patient taking differently: Take 1 mg by mouth every evening.)   ipratropium-albuterol (DUONEB) 0.5-2.5 (3) MG/3ML SOLN Take 3 mLs by nebulization  every 4 (four) hours as needed.   midodrine (PROAMATINE) 5 MG tablet 1 tablet with a meal Orally three times a day   Multiple Vitamins-Minerals ([place])  CHEW Chew 1 tablet by mouth in the morning and at bedtime.   naphazoline-pheniramine (VISINE) 0.025-0.3 % ophthalmic solution Place 1 drop into both eyes daily as needed for eye irritation.   pantoprazole (PROTONIX) 40 MG tablet Take 1 tablet (40 mg total) by mouth daily.   predniSONE (DELTASONE) 20 MG tablet 4 tablets p.o. daily for 1 week followed by 3 tablets p.o. daily for 1 week followed by 2 tablet p.o. daily for 1 week followed by 1 tablet p.o. daily for 1 week followed by half a tablet p.o. daily for 1 week   rivaroxaban (XARELTO) 20 MG TABS tablet Take 1 tablet (20 mg total) by mouth daily with supper.   tamsulosin (FLOMAX) 0.4 MG CAPS capsule Take 0.4 mg by mouth at bedtime.   No current facility-administered medications on file prior to visit.     Allergies  Allergen Reactions   Carboplatin Anaphylaxis, Shortness Of Breath, Cough and Hypertension    On dose 8. Became short of breath. Symptom management was called. Received benadryl, solumedrol, Pepcid and fluids. Medication discontinued.    Klonopin [Clonazepam] Other (See Comments)    nervous   Norco [Hydrocodone-Acetaminophen] Other (See Comments)    Made pt feel jittery     Review Of Systems:  Constitutional:   No  weight loss, night sweats,  Fevers, chills, +fatigue, or  lassitude.  HEENT:   No headaches,  Difficulty swallowing,  Tooth/dental problems, or  Sore throat,                No sneezing, itching, ear ache, nasal congestion, post nasal drip,   CV:  No chest pain,  Orthopnea, PND, swelling in lower extremities, anasarca, dizziness, palpitations, syncope.   GI  No heartburn, indigestion, abdominal pain, nausea, vomiting, diarrhea, change in bowel habits, loss of appetite, bloody stools.   Resp: + shortness of breath with exertion or at rest.  No  excess mucus, no productive cough,  No non-productive cough,  No coughing up of blood.  No change in color of mucus.  No wheezing.  No chest wall deformity  Skin: no rash or lesions.  GU: no dysuria, change in color of urine, no urgency or frequency.  No flank pain, no hematuria   MS:  No joint pain or swelling.  + decreased range of motion.  No back pain.  Psych:  No change in mood or affect. No depression or anxiety.  No memory loss. Anxious to return to work.    Vital Signs BP 98/68   Pulse (!) 116   Temp 98 F (36.7 C) (Oral)   Ht 5' 6.6" (1.692 m)   Wt 188 lb (85.3 kg)   SpO2 92%   BMI 29.80 kg/m    Physical Exam:  General- No distress,  A&Ox3 , pleasant ENT: No sinus tenderness, TM clear, pale nasal mucosa, no oral exudate,no post nasal drip, no LAN Cardiac: S1, S2, regular rate and rhythm, no murmur Chest: No wheeze/ rales/ dullness; no accessory muscle use, no nasal flaring, no sternal retractions, diminished per bases. Abd.: Soft Non-tender, ND, BS +, Body mass index is 29.8 kg/m.  Ext: No clubbing cyanosis, edema Neuro:  physical deconditioning, MAE x 4, A&O x 3 Skin: No rashes, warm and dry, no lesions  Psych: normal mood and behavior   Assessment/Plan Recurrent non-small cell lung cancer in former smoker Recent Hospital Admission for Pneumonitis / pneumonia>> resolving New ill-defined ground-glass densities throughout the right lung, resolution of previously noted densities in  left lung, but additional new left lung infiltrates. ? If Philip [name] may have contributed to immunotherapy mediated pneumonitis. ( On hold after 8 cycles of current therapy) Chemo resumed 02/19/2022 Acute on Chronic respiratory Failure with hypoxia Plan  We will restart your Midodrine 5 mg three times daily for you're blood pressure Take your BP twice daily. If BP top number is > 140, we will need to titrate off the midodrine. Continue oxygen 3 L [place] Remember oxygen saturations need to  be > 88% at all times. CT Chest without contrast to ensure pneumonia has cleared. I will place an order for Monday.  We will clear you for office work only, and training while in the office only. You must wear a mask in the office as you are immunosuppressed and at risk for infection.  . We will post date this to 02/03/2022 We will give you this letter today I will send you for pulmonary rehab. Get some Biotene Dry mouth rinse for the oral dryness.  Follow up with Dr. Earlie [name] as is scheduled. Follow up with Judson Roch NP in 2 weeks Video Visit to review CT Results and do a BP check  Please contact office for sooner follow up if symptoms do not improve or worsen or seek emergency care    I spent 50 minutes dedicated to the care of this patient on the date of this encounter to include pre-visit review of records, face-to-face time with the patient discussing conditions above, post visit ordering of testing, clinical documentation with the electronic health record, making appropriate referrals as documented, and communicating necessary information to the patient's healthcare team.    Magdalen Spatz, NP 02/27/2022  10:16 AM

## 2022-02-27 NOTE — Patient Instructions (Addendum)
It is good to see you today. We will restart your Midodrine 5 mg three times daily for you're blood pressure Take your BP twice daily. If BP top number is > 140, we will need to titrate off the midodrine. Continue oxygen 3 L [place] Remember oxygen saturations need to be > 88% at all times. CT Chest without contrast to ensure pneumonia has cleared. I will place an order for Monday.  We will clear you for office work only, and training while in the office only. You must wear a mask in the office as you are immunosuppressed and at risk for infection.  . We will post date this to 02/03/2022 We will give you this letter today I will send you for pulmonary rehab. Get some Biotene Dry mouth rinse for the oral dryness.  Follow up with Dr. Earlie Server as is scheduled. Follow up with Judson Roch NP in 2 weeks Video Visit to review CT Results and do a BP check. .  Please contact office for sooner follow up if symptoms do not improve or worsen or seek emergency care

## 2022-02-27 NOTE — Telephone Encounter (Signed)
Noted.  Will close encounter.  

## 2022-03-02 ENCOUNTER — Encounter: Payer: Self-pay | Admitting: Internal Medicine

## 2022-03-02 ENCOUNTER — Encounter: Payer: Self-pay | Admitting: Physician Assistant

## 2022-03-04 ENCOUNTER — Encounter: Payer: Self-pay | Admitting: Internal Medicine

## 2022-03-04 ENCOUNTER — Encounter: Payer: Self-pay | Admitting: Physician Assistant

## 2022-03-05 ENCOUNTER — Other Ambulatory Visit: Payer: Self-pay | Admitting: Internal Medicine

## 2022-03-05 ENCOUNTER — Encounter: Payer: Self-pay | Admitting: Internal Medicine

## 2022-03-05 ENCOUNTER — Encounter: Payer: Self-pay | Admitting: Physician Assistant

## 2022-03-06 ENCOUNTER — Encounter: Payer: Self-pay | Admitting: Physician Assistant

## 2022-03-06 ENCOUNTER — Encounter: Payer: Self-pay | Admitting: Internal Medicine

## 2022-03-06 NOTE — Progress Notes (Unsigned)
[facility] OFFICE PROGRESS NOTE  [name], MD [address] [place] [address]  DIAGNOSIS: 1) recurrent non-small cell lung cancer initially diagnosed as stage IIIc (T4, N3, M0) non-small cell lung cancer, adenocarcinoma presented with large right hilar mass in addition to bilateral hilar and mediastinal lymphadenopathy diagnosed in December 2020.  The patient has evidence for disease recurrence in October 2022. 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  Started on Xarelto on March 17, 2020 and currently 20 mg p.o. daily.  PRIOR THERAPY: 1) Weekly concurrent chemoradiation with carboplatin for an AUC of 2 and paclitaxel 45 mg/m2. First dose starting 01/30/2019.   Status post 6 cycles. 2) Consolidation immunotherapy with Imfinzi 1500 mg IV every 4 weeks. First dose expected on 04/19/2019.  Status post 13 cycles. 3) disease recurrence in October 2022.  CURRENT THERAPY: Systemic chemotherapy with carboplatin for AUC of 5, Alimta 500 Mg/M2 and Keytruda 200 Mg IV every 3 weeks.  First dose January 01, 2021.  Status post 19 cycles.  He had hypersensitivity reaction to carboplatin and this was discontinued after cycle #2.  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    INTERVAL HISTORY: Philip [name] 67 y.o. male returns to the clinic today for a follow-up visit.  The patient was last seen by myself and Dr. Julien Nordmann on 02/19/22.  The patient had a prolonged hospitalization in December or possible pneumonia/pneumonitis. The patient is currently on his last day of prednisone.  He is being followed by pulmonary medicine. He is scheduled for a repeat CT scan of the chest later today to follow up on this. He saw them recently on 1/26. They are referring him to pulmonary rehab. Additionally, they  recommended he restart his midodrine for his hypotension. They refilled this on 02/27/22. He has not picked this up yet. His blood pressure continues to be low today with his systolic being 97.    Since last being seen, the patient feels similar. He is still on supplemental oxygen and is not back to his baseline but he reports he thinks his shortness of breath is better than it was prior and he notices he recovers his breath quicker after exertion compared to prior. He has a pulse oximeter at home. He uses 4 L of supplemental oxygen with exertion, and less when not exerting. He mentions he was smoking an herb that is reportedly a homeopathic remedy for shortness of breath, however, it caused him to cough so he stopped doing that. Otherwise, he has a stable cough that comes and goes. Most of the time, it is a dry cough. He reports he will take mucinex which helps. He quit smoking cigarettes.    He denies any fever or chills. He gained a few pounds from the steroids and is using compression stockings, elevating his legs, and avoiding salty food for some lower extremity swelling.  He reports because of the steroids, he has a very strong appetite. He denies any chest pain or hemoptysis.  Denies any recent nausea, vomiting, diarrhea, or constipation.  Denies any headaches.  He is here today for evaluation repeat blood work before undergoing his next cycle of Alimta with cycle #20 of treatment.     MEDICAL HISTORY: Past Medical History:  Diagnosis Date   COPD (chronic obstructive pulmonary disease) (HCC)    GERD (gastroesophageal reflux  disease)    Headache    migraines   Hyperlipidemia    lung ca dx'd 12/2018   Pneumonia    Pulmonary embolus (HCC)    Tobacco abuse     ALLERGIES:  is allergic to carboplatin, klonopin [clonazepam], and norco [hydrocodone-acetaminophen].  MEDICATIONS:  Current Outpatient Medications  Medication Sig Dispense Refill   acetaminophen (TYLENOL) 500 MG tablet Take  500-1,000 mg by mouth every 6 (six) hours as needed (pain.).     albuterol (PROVENTIL) (2.5 MG/3ML) 0.083% nebulizer solution Take 3 mLs (2.5 mg total) by nebulization every 4 (four) hours as needed for wheezing or shortness of breath. 75 mL 2   albuterol (VENTOLIN HFA) 108 (90 Base) MCG/ACT inhaler Inhale 2 puffs into the lungs every 4 (four) hours as needed for wheezing or shortness of breath. 18 g 11   Budeson-Glycopyrrol-Formoterol (BREZTRI AEROSPHERE) 160-9-4.8 MCG/ACT AERO Inhale 2 puffs into the lungs in the morning and at bedtime. 1 each 3   dextromethorphan-guaiFENesin (MUCINEX DM) 30-600 MG 12hr tablet Take 1 tablet by mouth 2 (two) times daily as needed for cough. 40 tablet 0   famotidine (PEPCID) 20 MG tablet TAKE 1 TABLET BY MOUTH TWICE A DAY 222 tablet 1   folic acid (FOLVITE) 1 MG tablet TAKE 1 TABLET BY MOUTH EVERY DAY 90 tablet 1   ipratropium-albuterol (DUONEB) 0.5-2.5 (3) MG/3ML SOLN Take 3 mLs by nebulization every 4 (four) hours as needed. 360 mL 1   midodrine (PROAMATINE) 5 MG tablet Take 1 tablet (5 mg total) by mouth 3 (three) times daily with meals. 90 tablet 3   Multiple Vitamins-Minerals (MULTI ADULT GUMMIES) CHEW Chew 1 tablet by mouth in the morning and at bedtime.     naphazoline-pheniramine (VISINE) 0.025-0.3 % ophthalmic solution Place 1 drop into both eyes daily as needed for eye irritation.     pantoprazole (PROTONIX) 40 MG tablet Take 1 tablet (40 mg total) by mouth daily. 30 tablet 1   predniSONE (DELTASONE) 20 MG tablet 4 tablets p.o. daily for 1 week followed by 3 tablets p.o. daily for 1 week followed by 2 tablet p.o. daily for 1 week followed by 1 tablet p.o. daily for 1 week followed by half a tablet p.o. daily for 1 week 75 tablet 0   rivaroxaban (XARELTO) 20 MG TABS tablet Take 1 tablet (20 mg total) by mouth daily with supper. 30 tablet 11   tamsulosin (FLOMAX) 0.4 MG CAPS capsule Take 0.4 mg by mouth at bedtime.     No current facility-administered  medications for this visit.    SURGICAL HISTORY:  Past Surgical History:  Procedure Laterality Date   COLONOSCOPY     CYSTOSCOPY W/ URETERAL STENT PLACEMENT Right 10/24/2021   Procedure: CYSTOSCOPY WITH RETROGRADE PYELOGRAM/URETERAL STENT PLACEMENT;  Surgeon: Janith Lima, MD;  Location: [facility];  Service: Urology;  Laterality: Right;   HAND SURGERY     VIDEO BRONCHOSCOPY WITH ENDOBRONCHIAL ULTRASOUND N/A 12/22/2018   Procedure: VIDEO BRONCHOSCOPY WITH ENDOBRONCHIAL ULTRASOUND;  Surgeon: Garner Nash, DO;  Location: [facility];  Service: Thoracic;  Laterality: N/A;   VIDEO BRONCHOSCOPY WITH ENDOBRONCHIAL ULTRASOUND N/A 01/09/2019   Procedure: VIDEO BRONCHOSCOPY WITH ENDOBRONCHIAL ULTRASOUND WITH FLUORO;  Surgeon: Garner Nash, DO;  Location: [facility];  Service: Thoracic;  Laterality: N/A;   VIDEO BRONCHOSCOPY WITH RADIAL ENDOBRONCHIAL ULTRASOUND N/A 12/22/2018   Procedure: RADIAL ENDOBRONCHIAL ULTRASOUND;  Surgeon: Garner Nash, DO;  Location: [facility];  Service: Thoracic;  Laterality: N/A;  REVIEW OF SYSTEMS:   Review of Systems  Constitutional: Positive for weight gain. Negative for appetite change, chills, fatigue, and fever.  HENT: Negative for mouth sores, nosebleeds, sore throat and trouble swallowing.   Eyes: Negative for eye problems and icterus.  Respiratory: Positive for shortness of breath. Negative for cough, hemoptysis, and wheezing.   Cardiovascular: Positive for bilateral lower extremity swelling. Negative for chest pain.  Gastrointestinal: Negative for abdominal pain, constipation, diarrhea, nausea and vomiting.  Genitourinary: Negative for bladder incontinence, difficulty urinating, dysuria, frequency and hematuria.   Musculoskeletal: Negative for back pain, gait problem, neck pain and neck stiffness.  Skin: Negative for itching and rash.  Neurological: Negative for dizziness, extremity weakness, gait problem, headaches, light-headedness and seizures.  Hematological:  Negative for adenopathy. Does not bruise/bleed easily.  Psychiatric/Behavioral: Negative for confusion, depression and sleep disturbance. The patient is not nervous/anxious.   PHYSICAL EXAMINATION:  Blood pressure 97/89, pulse (!) 107, temperature 97.8 F (36.6 C), temperature source Temporal, resp. rate 17, height 5' 6.6" (1.692 m), weight 194 lb 11.2 oz (88.3 kg), SpO2 98 %.  ECOG PERFORMANCE STATUS: 1  Physical Exam  Constitutional: Oriented to person, place, and time and well-developed, well-nourished, and in no distress.  HENT:  Head: Normocephalic and atraumatic.  Mouth/Throat: Oropharynx is clear and moist. No oropharyngeal exudate.  Eyes: Conjunctivae are normal. Right eye exhibits no discharge. Left eye exhibits no discharge. No scleral icterus.  Neck: Normal range of motion. Neck supple.  Cardiovascular: Tachycardic, regular rhythm, normal heart sounds and intact distal pulses.   Pulmonary/Chest: Effort normal and breath sounds normal. No respiratory distress. No wheezes. No rales. On 4 L supplemental oxygen.  Abdominal: Soft. Bowel sounds are normal. Exhibits no distension and no mass. There is no tenderness.  Musculoskeletal: Normal range of motion. Positive for bilateral lower extremity swelling.  Lymphadenopathy:    No cervical adenopathy.  Neurological: Alert and oriented to person, place, and time. Exhibits normal muscle tone. Gait normal. Coordination normal.  Skin: Skin is warm and dry. No rash noted. Not diaphoretic. No erythema. No pallor.  Psychiatric: Mood,  memory and judgment normal.  Vitals reviewed.  LABORATORY DATA: Lab Results  Component Value Date   WBC 6.4 03/11/2022   HGB 13.8 03/11/2022   HCT 41.9 03/11/2022   MCV 94.6 03/11/2022   PLT 254 03/11/2022      Chemistry      Component Value Date/Time   NA 136 03/11/2022 1103   K 3.4 (L) 03/11/2022 1103   CL 96 (L) 03/11/2022 1103   CO2 34 (H) 03/11/2022 1103   BUN 18 03/11/2022 1103    CREATININE 0.99 03/11/2022 1103      Component Value Date/Time   CALCIUM 9.1 03/11/2022 1103   ALKPHOS 72 03/11/2022 1103   AST 20 03/11/2022 1103   ALT 20 03/11/2022 1103   BILITOT 0.6 03/11/2022 1103       RADIOGRAPHIC STUDIES:  No results found.   ASSESSMENT/PLAN:  This is a very pleasant 67 year old Caucasian male initially diagnosed with stage IIIc non-small cell lung cancer, adenocarcinoma.  He was diagnosed in 2020. Molecular studies negative for any actionable mutations.  He was found to have disease recurrence in October 2022. No actionable mutations by guardant 360.    The patient initially completed concurrent chemoradiation with weekly carboplatin for an AUC of 2 and paclitaxel 45 mg per metered squared.  He status post 6 cycles.  He tolerated this well with a partial response.   He  then underwent consolidation immunotherapy with Imfinzi 1500 mg IV every 4 weeks.  He status post 13 cycles.   In September 2022, the patient scan showed an increase in the size of the soft tissue nodule in the right middle lobe adjacent to the post radiation changes which is highly suspicious for disease recurrence.  He also has a soft tissue of the right hilum/azygoesophageal recess increased compared to remote prior studies.    The patient a PET scan that showed hypermetabolic nodule in the right lower lobe adjacent to the post radiation treatment site is increased consistent with lung cancer recurrence.  There is also a second hypermetabolic nodule in the right lower lobe above the diaphragm concerning for recurrence and hypermetabolic contralateral lymph node in the high left paratracheal mediastinum also concerning for metastatic adenopathy recurrence.   Dr. Julien Nordmann arranged for the patient to have guardant 360 molecular testing performed which showed no actionable mutations.   Therefore, Dr. Julien Nordmann recommend the patient start palliative systemic chemotherapy with carboplatin for an AUC of  5, Alimta 500 mg per metered square, Keytruda 200 mg IV every 3 weeks. He is status post 19 cycles of treatment. Starting from cycle #5, the patient started maintenance alimta 500 mg/m2 and Keytruda 200 mg IV every 3 weeks.  [name] was discontinued starting from cycle #9 due to possible pneumonitis.   The patient was hospitalized for several weeks in December 2023 for pneumonia and pneumonitis. When the patient was last seen by Dr. Julien Nordmann on 02/05/2022 the patient had a CT angiogram which showed some areas of resolved densities but some new areas which are suspicious for infectious/inflammatory changes.   The patient is currently on a high-dose prednisone taper. His last day of his taper is today.   He is scheduled for a CT scan to follow up on his pneumonia/pneumonitis later today. I will be on the lookout for the results. He is being followed closely by pulmonary medicine for this.    Labs were reviewed. Recommend that he proceed with cycle #20 today as scheduled.   We will see him back for follow-up visit in 3 weeks for evaluation before starting cycle #20.   He is currently on Xarelto for his history of PE.  I reviewed the instructions with the patient for his midodrine. He has not been taking this but will pick it up today and take it as prescribed and monitor his BP BID.   It looks like he is scheduled to establish care with palliative care today.   Advised him to avoid smoking as well as smoking any herbs.   He will be on the lookout for a call from pulmonary rehab to schedule an appointment.   The patient was advised to call immediately if he has any concerning symptoms in the interval. The patient voices understanding of current disease status and treatment options and is in agreement with the current care plan. All questions were answered. The patient knows to call the clinic with any problems, questions or concerns. We can certainly see the patient much sooner if  necessary       No orders of the defined types were placed in this encounter.    The total time spent in the appointment was 20-29 minutes.   Kareen Hitsman L Tessia Kassin, [name] 03/11/22

## 2022-03-09 ENCOUNTER — Other Ambulatory Visit: Payer: Self-pay | Admitting: Internal Medicine

## 2022-03-10 ENCOUNTER — Other Ambulatory Visit: Payer: Self-pay | Admitting: Internal Medicine

## 2022-03-11 ENCOUNTER — Inpatient Hospital Stay (HOSPITAL_BASED_OUTPATIENT_CLINIC_OR_DEPARTMENT_OTHER): Payer: No Typology Code available for payment source | Admitting: Nurse Practitioner

## 2022-03-11 ENCOUNTER — Encounter: Payer: Self-pay | Admitting: Nurse Practitioner

## 2022-03-11 ENCOUNTER — Ambulatory Visit (HOSPITAL_COMMUNITY)
Admission: RE | Admit: 2022-03-11 | Discharge: 2022-03-11 | Disposition: A | Discharge status: Home / Self Care | Payer: No Typology Code available for payment source | Source: Ambulatory Visit | Attending: Acute Care | Admitting: Acute Care

## 2022-03-11 ENCOUNTER — Inpatient Hospital Stay (HOSPITAL_BASED_OUTPATIENT_CLINIC_OR_DEPARTMENT_OTHER): Payer: No Typology Code available for payment source | Admitting: Physician Assistant

## 2022-03-11 ENCOUNTER — Inpatient Hospital Stay: Payer: No Typology Code available for payment source | Attending: Internal Medicine

## 2022-03-11 ENCOUNTER — Inpatient Hospital Stay: Payer: No Typology Code available for payment source

## 2022-03-11 ENCOUNTER — Encounter: Payer: Self-pay | Admitting: Physician Assistant

## 2022-03-11 ENCOUNTER — Encounter: Payer: Self-pay | Admitting: Internal Medicine

## 2022-03-11 VITALS — HR 116

## 2022-03-11 VITALS — BP 97/89 | HR 107 | Temp 97.8°F | Resp 17 | Ht 66.6 in | Wt 194.7 lb

## 2022-03-11 DIAGNOSIS — C3411 Malignant neoplasm of upper lobe, right bronchus or lung: Secondary | ICD-10-CM | POA: Insufficient documentation

## 2022-03-11 DIAGNOSIS — Z7189 Other specified counseling: Secondary | ICD-10-CM

## 2022-03-11 DIAGNOSIS — C3490 Malignant neoplasm of unspecified part of unspecified bronchus or lung: Secondary | ICD-10-CM

## 2022-03-11 DIAGNOSIS — Z5111 Encounter for antineoplastic chemotherapy: Secondary | ICD-10-CM

## 2022-03-11 DIAGNOSIS — G893 Neoplasm related pain (acute) (chronic): Secondary | ICD-10-CM | POA: Diagnosis not present

## 2022-03-11 DIAGNOSIS — J181 Lobar pneumonia, unspecified organism: Secondary | ICD-10-CM | POA: Insufficient documentation

## 2022-03-11 DIAGNOSIS — Z7901 Long term (current) use of anticoagulants: Secondary | ICD-10-CM | POA: Insufficient documentation

## 2022-03-11 DIAGNOSIS — Z79899 Other long term (current) drug therapy: Secondary | ICD-10-CM | POA: Diagnosis not present

## 2022-03-11 DIAGNOSIS — C3491 Malignant neoplasm of unspecified part of right bronchus or lung: Secondary | ICD-10-CM

## 2022-03-11 DIAGNOSIS — K219 Gastro-esophageal reflux disease without esophagitis: Secondary | ICD-10-CM | POA: Insufficient documentation

## 2022-03-11 DIAGNOSIS — R918 Other nonspecific abnormal finding of lung field: Secondary | ICD-10-CM | POA: Insufficient documentation

## 2022-03-11 DIAGNOSIS — Z923 Personal history of irradiation: Secondary | ICD-10-CM | POA: Insufficient documentation

## 2022-03-11 DIAGNOSIS — Z7952 Long term (current) use of systemic steroids: Secondary | ICD-10-CM | POA: Diagnosis not present

## 2022-03-11 DIAGNOSIS — Z86711 Personal history of pulmonary embolism: Secondary | ICD-10-CM | POA: Diagnosis not present

## 2022-03-11 DIAGNOSIS — Z515 Encounter for palliative care: Secondary | ICD-10-CM

## 2022-03-11 DIAGNOSIS — E785 Hyperlipidemia, unspecified: Secondary | ICD-10-CM | POA: Insufficient documentation

## 2022-03-11 DIAGNOSIS — J449 Chronic obstructive pulmonary disease, unspecified: Secondary | ICD-10-CM | POA: Diagnosis not present

## 2022-03-11 DIAGNOSIS — C778 Secondary and unspecified malignant neoplasm of lymph nodes of multiple regions: Secondary | ICD-10-CM | POA: Insufficient documentation

## 2022-03-11 LAB — CMP (CANCER CENTER ONLY)
ALT: 20 U/L (ref 0–44)
AST: 20 U/L (ref 15–41)
Albumin: 3.5 g/dL (ref 3.5–5.0)
Alkaline Phosphatase: 72 U/L (ref 38–126)
Anion gap: 6 (ref 5–15)
BUN: 18 mg/dL (ref 8–23)
CO2: 34 mmol/L — ABNORMAL HIGH (ref 22–32)
Calcium: 9.1 mg/dL (ref 8.9–10.3)
Chloride: 96 mmol/L — ABNORMAL LOW (ref 98–111)
Creatinine: 0.99 mg/dL (ref 0.61–1.24)
GFR, Estimated: >60 mL/min (ref >60)
Glucose, Bld: 100 mg/dL — ABNORMAL HIGH (ref 70–99)
Potassium: 3.4 mmol/L — ABNORMAL LOW (ref 3.5–5.1)
Sodium: 136 mmol/L (ref 135–145)
Total Bilirubin: 0.6 mg/dL (ref 0.3–1.2)
Total Protein: 6.6 g/dL (ref 6.5–8.1)

## 2022-03-11 LAB — CBC WITH DIFFERENTIAL (CANCER CENTER ONLY)
Abs Immature Granulocytes: 0.11 10*3/uL — ABNORMAL HIGH (ref 0.00–0.07)
Basophils Absolute: 0 10*3/uL (ref 0.0–0.1)
Basophils Relative: 1 %
Eosinophils Absolute: 0.1 10*3/uL (ref 0.0–0.5)
Eosinophils Relative: 1 %
HCT: 41.9 % (ref 39.0–52.0)
Hemoglobin: 13.8 g/dL (ref 13.0–17.0)
Immature Granulocytes: 2 %
Lymphocytes Relative: 12 %
Lymphs Abs: 0.8 10*3/uL (ref 0.7–4.0)
MCH: 31.2 pg (ref 26.0–34.0)
MCHC: 32.9 g/dL (ref 30.0–36.0)
MCV: 94.6 fL (ref 80.0–100.0)
Monocytes Absolute: 0.8 10*3/uL (ref 0.1–1.0)
Monocytes Relative: 13 %
Neutro Abs: 4.6 10*3/uL (ref 1.7–7.7)
Neutrophils Relative %: 71 %
Platelet Count: 254 10*3/uL (ref 150–400)
RBC: 4.43 MIL/uL (ref 4.22–5.81)
RDW: 15.2 % (ref 11.5–15.5)
WBC Count: 6.4 10*3/uL (ref 4.0–10.5)
nRBC: 0 % (ref 0.0–0.2)

## 2022-03-11 MED ORDER — HYDROCODONE-ACETAMINOPHEN 5-325 MG PO TABS: 1.0000 | ORAL_TABLET | Freq: Two times a day (BID) | ORAL | 0 refills | Status: DC | PRN

## 2022-03-11 MED ORDER — PROCHLORPERAZINE MALEATE 10 MG PO TABS
10.0000 mg | ORAL_TABLET | Freq: Once | ORAL | Status: AC
  Administered 2022-03-11: 10 mg via ORAL
  Filled 2022-03-11: qty 1

## 2022-03-11 MED ORDER — SODIUM CHLORIDE 0.9 % IV SOLN: Freq: Once | INTRAVENOUS | Status: AC

## 2022-03-11 MED ORDER — SODIUM CHLORIDE 0.9 % IV SOLN
500.0000 mg/m2 | Freq: Once | INTRAVENOUS | Status: AC
  Administered 2022-03-11: 1000 mg via INTRAVENOUS
  Filled 2022-03-11: qty 40

## 2022-03-11 NOTE — Progress Notes (Signed)
Per Cassie PA, okay to treat with elevated heart rate

## 2022-03-11 NOTE — Progress Notes (Signed)
[facility]  [phone] [phone]   Name: DAMONDRE PFEIFLE Date: 03/11/2022 MRN: [id number]  DOB: 1955-04-25  Patient Care Team: [name], MD as PCP - General (Family Medicine) Valrie Hart, RN as Oncology Nurse Navigator Leanna Sato, RN as Case Manager (General Practice)    REASON FOR CONSULTATION: OLUWATOMISIN DEMAN is a 67 y.o. male with oncologic medical history including recurrent non-small cell lung cancer (01/2019 then again 12/2020). Past medical history also includes COPD, BPH, tobacco use, PE, and GERD. Palliative ask to see for symptom management and goals of care.    SOCIAL HISTORY:     reports that he quit smoking about 3 months ago. His smoking use included cigarettes. He started smoking about 51 years ago. He has a 90.00 pack-year smoking history. He has quit using smokeless tobacco.  His smokeless tobacco use included chew. He reports current alcohol use. He reports that he does not currently use drugs after having used the following drugs: Marijuana.  ADVANCE DIRECTIVES:  None on file  CODE STATUS: Full code  PAST MEDICAL HISTORY: Past Medical History:  Diagnosis Date   COPD (chronic obstructive pulmonary disease) ([place])    GERD (gastroesophageal reflux disease)    Headache    migraines   Hyperlipidemia    lung ca dx'd 12/2018   Pneumonia    Pulmonary embolus ([place])    Tobacco abuse     PAST SURGICAL HISTORY:  Past Surgical History:  Procedure Laterality Date   COLONOSCOPY     CYSTOSCOPY W/ URETERAL STENT PLACEMENT Right 10/24/2021   Procedure: CYSTOSCOPY WITH RETROGRADE PYELOGRAM/URETERAL STENT PLACEMENT;  Surgeon: Janith Lima, MD;  Location: [facility];  Service: Urology;  Laterality: Right;   HAND SURGERY     VIDEO BRONCHOSCOPY WITH ENDOBRONCHIAL ULTRASOUND N/A 12/22/2018   Procedure: VIDEO BRONCHOSCOPY WITH ENDOBRONCHIAL ULTRASOUND;  Surgeon: Garner Nash, DO;  Location: [facility];   Service: Thoracic;  Laterality: N/A;   VIDEO BRONCHOSCOPY WITH ENDOBRONCHIAL ULTRASOUND N/A 01/09/2019   Procedure: VIDEO BRONCHOSCOPY WITH ENDOBRONCHIAL ULTRASOUND WITH FLUORO;  Surgeon: Garner Nash, DO;  Location: [facility];  Service: Thoracic;  Laterality: N/A;   VIDEO BRONCHOSCOPY WITH RADIAL ENDOBRONCHIAL ULTRASOUND N/A 12/22/2018   Procedure: RADIAL ENDOBRONCHIAL ULTRASOUND;  Surgeon: Garner Nash, DO;  Location: [facility];  Service: Thoracic;  Laterality: N/A;    HEMATOLOGY/ONCOLOGY HISTORY:  Oncology History  Adenocarcinoma of right lung, stage 3 ([place])  01/13/2019 Initial Diagnosis   Adenocarcinoma of right lung, stage 3 ([place])   01/30/2019 - 03/06/2019 Chemotherapy   The patient had palonosetron (ALOXI) injection 0.25 mg, 0.25 mg, Intravenous,  Once, 6 of 7 cycles Administration: 0.25 mg (01/30/2019), 0.25 mg (02/06/2019), 0.25 mg (02/27/2019), 0.25 mg (03/06/2019), 0.25 mg (02/13/2019), 0.25 mg (02/20/2019) CARBOplatin (PARAPLATIN) 210 mg in sodium chloride 0.9 % 250 mL chemo infusion, 210 mg (100 % of original dose 209.2 mg), Intravenous,  Once, 6 of 7 cycles Dose modification: 209.2 mg (original dose 209.2 mg, Cycle 1), 209.2 mg (original dose 209.2 mg, Cycle 5), 242.8 mg (original dose 209.2 mg, Cycle 5, Reason: Change in SCr/CrCl) Administration: 210 mg (01/30/2019), 210 mg (02/06/2019), 210 mg (02/27/2019), 210 mg (03/06/2019), 210 mg (02/13/2019), 210 mg (02/20/2019) PACLitaxel (TAXOL) 90 mg in sodium chloride 0.9 % 250 mL chemo infusion (</= 80mg /m2), 45 mg/m2 = 90 mg, Intravenous,  Once, 6 of 7 cycles Administration: 90 mg (01/30/2019), 90 mg (02/06/2019), 90 mg (02/27/2019), 90 mg (03/06/2019), 90  mg (02/13/2019), 90 mg (02/20/2019)  for chemotherapy treatment.    04/19/2019 - 04/18/2020 Chemotherapy   Patient is on Treatment Plan : LUNG NSCLC Durvalumab q28d     03/21/2020 Cancer Staging   Staging form: Lung, AJCC 8th Edition - Clinical: Stage IIIC (cT4, cN3, cM0) - Signed by [name],  MD on 03/21/2020   01/01/2021 -  Chemotherapy   Patient is on Treatment Plan : LUNG Carboplatin (5) + Pemetrexed (500) + Pembrolizumab (200) D1 q21d Induction x 4 cycles / Maintenance Pemetrexed (500) + Pembrolizumab (200) D1 q21d       ALLERGIES:  is allergic to carboplatin, klonopin [clonazepam], and norco [hydrocodone-acetaminophen].  MEDICATIONS:  Current Outpatient Medications  Medication Sig Dispense Refill   acetaminophen (TYLENOL) 500 MG tablet Take 500-1,000 mg by mouth every 6 (six) hours as needed (pain.).     albuterol (PROVENTIL) (2.5 MG/3ML) 0.083% nebulizer solution Take 3 mLs (2.5 mg total) by nebulization every 4 (four) hours as needed for wheezing or shortness of breath. 75 mL 2   albuterol (VENTOLIN HFA) 108 (90 Base) MCG/ACT inhaler Inhale 2 puffs into the lungs every 4 (four) hours as needed for wheezing or shortness of breath. 18 g 11   Budeson-Glycopyrrol-Formoterol (BREZTRI AEROSPHERE) 160-9-4.8 MCG/ACT AERO Inhale 2 puffs into the lungs in the morning and at bedtime. 1 each 3   dextromethorphan-guaiFENesin (MUCINEX DM) 30-600 MG 12hr tablet Take 1 tablet by mouth 2 (two) times daily as needed for cough. 40 tablet 0   famotidine (PEPCID) 20 MG tablet TAKE 1 TABLET BY MOUTH TWICE A DAY 308 tablet 1   folic acid (FOLVITE) 1 MG tablet TAKE 1 TABLET BY MOUTH EVERY DAY 90 tablet 1   ipratropium-albuterol (DUONEB) 0.5-2.5 (3) MG/3ML SOLN Take 3 mLs by nebulization every 4 (four) hours as needed. 360 mL 1   midodrine (PROAMATINE) 5 MG tablet Take 1 tablet (5 mg total) by mouth 3 (three) times daily with meals. 90 tablet 3   Multiple Vitamins-Minerals (MULTI ADULT GUMMIES) CHEW Chew 1 tablet by mouth in the morning and at bedtime.     naphazoline-pheniramine (VISINE) 0.025-0.3 % ophthalmic solution Place 1 drop into both eyes daily as needed for eye irritation.     pantoprazole (PROTONIX) 40 MG tablet Take 1 tablet (40 mg total) by mouth daily. 30 tablet 1   predniSONE  (DELTASONE) 20 MG tablet 4 tablets p.o. daily for 1 week followed by 3 tablets p.o. daily for 1 week followed by 2 tablet p.o. daily for 1 week followed by 1 tablet p.o. daily for 1 week followed by half a tablet p.o. daily for 1 week 75 tablet 0   rivaroxaban (XARELTO) 20 MG TABS tablet Take 1 tablet (20 mg total) by mouth daily with supper. 30 tablet 11   tamsulosin (FLOMAX) 0.4 MG CAPS capsule Take 0.4 mg by mouth at bedtime.     No current facility-administered medications for this visit.    VITAL SIGNS: There were no vitals taken for this visit. There were no vitals filed for this visit.  Estimated body mass index is 29.8 kg/m as calculated from the following:   Height as of 02/27/22: 5' 6.6" (1.692 m).   Weight as of 02/27/22: 188 lb (85.3 kg).  LABS: CBC:    Component Value Date/Time   WBC 16.7 (H) 02/19/2022 1353   WBC 13.2 (H) 01/20/2022 0645   HGB 14.3 02/19/2022 1353   HCT 42.4 02/19/2022 1353   PLT 217 02/19/2022 1353  MCV 92.6 02/19/2022 1353   NEUTROABS 14.7 (H) 02/19/2022 1353   LYMPHSABS 0.9 02/19/2022 1353   MONOABS 0.8 02/19/2022 1353   EOSABS 0.0 02/19/2022 1353   BASOSABS 0.0 02/19/2022 1353   Comprehensive Metabolic Panel:    Component Value Date/Time   NA 136 02/19/2022 1353   K 3.7 02/19/2022 1353   CL 100 02/19/2022 1353   CO2 27 02/19/2022 1353   BUN 23 02/19/2022 1353   CREATININE 1.09 02/19/2022 1353   GLUCOSE 173 (H) 02/19/2022 1353   CALCIUM 9.3 02/19/2022 1353   AST 20 02/19/2022 1353   ALT 26 02/19/2022 1353   ALKPHOS 68 02/19/2022 1353   BILITOT 0.5 02/19/2022 1353   PROT 6.3 (L) 02/19/2022 1353   ALBUMIN 3.3 (L) 02/19/2022 1353    RADIOGRAPHIC STUDIES: No results found.  PERFORMANCE STATUS (ECOG) : 1 - Symptomatic but completely ambulatory  Review of Systems  Constitutional:  Positive for activity change and appetite change.  Cardiovascular:  Positive for chest pain.  Musculoskeletal:  Positive for back pain.  Unless otherwise  noted, a complete review of systems is negative.  Physical Exam General: NAD Cardiovascular: regular rate and rhythm Pulmonary: clear ant fields Abdomen: soft, nontender, + bowel sounds Extremities: no edema, no joint deformities Skin: no rashes Neurological:AAOx 3, mood appropriate   IMPRESSION: This is my initial visit with Mr. Magallanes. He presents to clinic by himself. No acute distress noted. Ambulatory with cane. Oxygen 3L. Alert and able to engage in discussions appropriately.   I introduced myself, Maygan RN, and Palliative's role in collaboration with the oncology team. Concept of Palliative Care was introduced as specialized medical care for people and their families living with serious illness.  It focuses on providing relief from the symptoms and stress of a serious illness.  The goal is to improve quality of life for both the patient and the family. Values and goals of care important to patient and family were attempted to be elicited.   Mr. Krupka lives in the home with his wife of 5 years. They have been together for almost 11+ years. This is his second marriage. He has 3 daughters and 9 grandchildren. 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   At home he is able to perform most ADLs however requires rest breaks due to fatigue, shortness of breath, and some discomfort. Appetite is great. States he eats 3 good meals with in between snacking. Relates some of his increased appetite to recent use of high dose steroids.   Catalina Antigua is complaining of uncontrolled pain. Reports chest wall pain that radiates around to back. Pain is more intense on right side but at times will also extend to left side. Nothing makes pain better. Describes pain as sharp, throbbing, stabbing, intermittent. Also endorses some joint discomfort in knees. Patient reports using Tylenol or ibuprofen with little to no relief. Pain interferes with his quality of life and ability to perform activities around  the house.   I performed extensive social history. Catalina Antigua denies alcohol use over the past 2 years. Reports due to change of taste and recurrence of his cancer. Denies any illicit drug use. 15 years prior he smoked marijuana. Continues to smoke despite significant pulmonary disease. Encouraged smoking cessation in addition to use of any herbs or unapproved vapes or medications not discussed with a medical provider.   Lengthy discussion regarding pain management in relation to other co-morbidities including hypotension and pulmonary disease which places patient at higher risk.  He verbalized understanding. Education provided on protocol to receive opioid prescriptions including random UDS and pill counts at appointments.   Medication reconciliation and allergy review completed. Patient denies true allergy to hydrocodone. Reports taking in the past without difficulty. Education provided on use of hydrocodone frequency of use, and safety. Mr. Kidane verbalized understanding. Pain contract completed.   Denies constipation. Education provided on bowel regimen in the setting of opioid use.   We discussed his current illness and what it means in the larger context of Her on-going co-morbidities. Natural disease trajectory and expectations were discussed.  Mr. Mcclintock is realistic in his understanding of current illness and expectations. He is clear in expressed wishes to continue treating the treatable allowing him every opportunity to continue to thrive.   I discussed the importance of continued conversation with family and their medical providers regarding overall plan of care and treatment options, ensuring decisions are within the context of the patients values and GOCs.  PLAN: Established therapeutic relationship. Education provided on palliative's role in collaboration with their Oncology/Radiation team. Hydrocodone 5/325 mg one tablet every 12 hours as needed for moderate to severe pain.  Extensive  education regarding pain management, ability to continue with future symptom management, safety use of medication. Education provided on potential side effects. Patient denies concerns wth taking hydrocodone.  Pain contract reviewed and completed.  Bowel regimen  Smoking cessation Ongoing goals of care and symptom management support.  I will plan to see patient back in 2-4 weeks in collaboration to other oncology appointments.    Patient expressed understanding and was in agreement with this plan. He also understands that He can call the clinic at any time with any questions, concerns, or complaints.   Thank you for your referral and allowing Palliative to assist in Mr. Creedon Danielski Musquiz's care.   Number and complexity of problems addressed: HIGH - 1 or more chronic illnesses with SEVERE exacerbation, progression, or side effects of treatment - advanced cancer, pain. Any controlled substances utilized were prescribed in the context of palliative care.PDMP reviewed.   Time Total: 55 min   Visit consisted of counseling and education dealing with the complex and emotionally intense issues of symptom management and palliative care in the setting of serious and potentially life-threatening illness.Greater than 50%  of this time was spent counseling and coordinating care related to the above assessment and plan.  Signed by: Alda Lea, AGPCNP-BC Palliative Medicine Team/[facility]

## 2022-03-11 NOTE — Patient Instructions (Signed)
[facility]  Discharge Instructions: Thank you for choosing [facility] to provide your oncology and hematology care.   If you have a lab appointment with the [facility], please go directly to the [facility] and check in at the registration area.   Wear comfortable clothing and clothing appropriate for easy access to any Portacath or PICC line.   We strive to give you quality time with your provider. You may need to reschedule your appointment if you arrive late (15 or more minutes).  Arriving late affects you and other patients whose appointments are after yours.  Also, if you miss three or more appointments without notifying the office, you may be dismissed from the clinic at the provider's discretion.      For prescription refill requests, have your pharmacy contact our office and allow 72 hours for refills to be completed.    Today you received the following chemotherapy and/or immunotherapy agents pemetrexed      To help prevent nausea and vomiting after your treatment, we encourage you to take your nausea medication as directed.  BELOW ARE SYMPTOMS THAT SHOULD BE REPORTED IMMEDIATELY: *FEVER GREATER THAN 100.4 F (38 C) OR HIGHER *CHILLS OR SWEATING *NAUSEA AND VOMITING THAT IS NOT CONTROLLED WITH YOUR NAUSEA MEDICATION *UNUSUAL SHORTNESS OF BREATH *UNUSUAL BRUISING OR BLEEDING *URINARY PROBLEMS (pain or burning when urinating, or frequent urination) *BOWEL PROBLEMS (unusual diarrhea, constipation, pain near the anus) TENDERNESS IN MOUTH AND THROAT WITH OR WITHOUT PRESENCE OF ULCERS (sore throat, sores in mouth, or a toothache) UNUSUAL RASH, SWELLING OR PAIN  UNUSUAL VAGINAL DISCHARGE OR ITCHING   Items with * indicate a potential emergency and should be followed up as soon as possible or go to the Emergency Department if any problems should occur.  Please show the CHEMOTHERAPY ALERT CARD or IMMUNOTHERAPY ALERT CARD at  check-in to the Emergency Department and triage nurse.  Should you have questions after your visit or need to cancel or reschedule your appointment, please contact [facility]  Dept: [phone]  and follow the prompts.  Office hours are 8:00 a.m. to 4:30 p.m. Monday - Friday. Please note that voicemails left after 4:00 p.m. may not be returned until the following business day.  We are closed weekends and major holidays. You have access to a nurse at all times for urgent questions. Please call the main number to the clinic Dept: [phone] and follow the prompts.   For any non-urgent questions, you may also contact your provider using MyChart. We now offer e-Visits for anyone 59 and older to request care online for non-urgent symptoms. For details visit [email or web address].   Also download the MyChart app! Go to the app store, search "MyChart", open the app, select [facility], and log in with your MyChart username and password.

## 2022-03-11 NOTE — Patient Instructions (Addendum)
-   Pick up and take your pain medications as prescribed - do not go more than 2 days without a bowel movement if you feel constipated take mirilax or sennakot to help you go, both medications are available over the counter - give [place] a call with any questions or concerns, if you develop a rash or an increase in shortness of breath call 911

## 2022-03-12 ENCOUNTER — Encounter: Payer: Self-pay | Admitting: Physician Assistant

## 2022-03-12 ENCOUNTER — Telehealth: Payer: Self-pay

## 2022-03-12 ENCOUNTER — Encounter: Payer: Self-pay | Admitting: Internal Medicine

## 2022-03-12 NOTE — Telephone Encounter (Signed)
Notified patient of completion of FMLA forms. Fax transmission confirmation received. Copy of forms emailed to Patient as requested at email address provided. No other needs or concerns voiced at this time.

## 2022-03-13 ENCOUNTER — Telehealth: Payer: Self-pay | Admitting: Physician Assistant

## 2022-03-13 NOTE — Telephone Encounter (Signed)
I called the patient to follow up in his CT scan he had a few days ago. No evidence of disease progression. We will see him as scheduled in 3 weeks.

## 2022-03-15 ENCOUNTER — Encounter: Payer: Self-pay | Admitting: Internal Medicine

## 2022-03-15 ENCOUNTER — Encounter: Payer: Self-pay | Admitting: Physician Assistant

## 2022-03-17 ENCOUNTER — Other Ambulatory Visit: Payer: Self-pay | Admitting: Urology

## 2022-03-18 ENCOUNTER — Telehealth: Payer: Self-pay | Admitting: Pulmonary Disease

## 2022-03-18 ENCOUNTER — Inpatient Hospital Stay (HOSPITAL_BASED_OUTPATIENT_CLINIC_OR_DEPARTMENT_OTHER): Payer: No Typology Code available for payment source | Admitting: Nurse Practitioner

## 2022-03-18 ENCOUNTER — Encounter: Payer: Self-pay | Admitting: Nurse Practitioner

## 2022-03-18 DIAGNOSIS — G893 Neoplasm related pain (acute) (chronic): Secondary | ICD-10-CM

## 2022-03-18 DIAGNOSIS — Z515 Encounter for palliative care: Secondary | ICD-10-CM

## 2022-03-18 DIAGNOSIS — K5900 Constipation, unspecified: Secondary | ICD-10-CM

## 2022-03-18 NOTE — Progress Notes (Signed)
[name]  Telephone:([phone]) [phone] Fax:(336) [phone]   Name: Philip [name] Date: 03/18/2022 MRN: [id number]  DOB: February 21, 1955  Patient Care Team: [name], MD as PCP - General (Family Medicine) Valrie Hart, RN as Oncology Nurse Navigator Leanna Sato, RN as Case Manager (General Practice) Pickenpack-Cousar, Carlena Sax, NP as Nurse Practitioner (Nurse Practitioner)   I connected with Rosanna Randy on 03/18/22 at  1:30 PM EST by phone and verified that I am speaking with the correct person using two identifiers.   I discussed the limitations, risks, security and privacy concerns of performing an evaluation and management service by telemedicine and the availability of in-person appointments. I also discussed with the patient that there may be a patient responsible charge related to this service. The patient expressed understanding and agreed to proceed.   Other persons participating in the visit and their role in the encounter: Maygan, RN    Patient's location: Home  Provider's location: [facility] HISTORY: Philip [name] is a 67 y.o. male with oncologic medical history including recurrent non-small cell lung cancer (01/2019 then again 12/2020). Past medical history also includes COPD, BPH, tobacco use, PE, and GERD. Palliative ask to see for symptom management and goals of care.   SOCIAL HISTORY:     reports that he quit smoking about 3 months ago. His smoking use included cigarettes. He started smoking about 51 years ago. He has a 90.00 pack-year smoking history. He has quit using smokeless tobacco.  His smokeless tobacco use included chew. He reports current alcohol use. He reports that he does not currently use drugs after having used the following drugs: Marijuana.  ADVANCE DIRECTIVES:  None on file   CODE STATUS: Full code  PAST MEDICAL HISTORY: Past Medical History:  Diagnosis Date   COPD (chronic  obstructive pulmonary disease) ([place])    GERD (gastroesophageal reflux disease)    Headache    migraines   Hyperlipidemia    lung ca dx'd 12/2018   Pneumonia    Pulmonary embolus ([facility])    Tobacco abuse     ALLERGIES:  is allergic to carboplatin, klonopin [clonazepam], and norco [hydrocodone-acetaminophen].  MEDICATIONS:  Current Outpatient Medications  Medication Sig Dispense Refill   acetaminophen (TYLENOL) 500 MG tablet Take 500-1,000 mg by mouth every 6 (six) hours as needed (pain.).     albuterol (PROVENTIL) (2.5 MG/3ML) 0.083% nebulizer solution Take 3 mLs (2.5 mg total) by nebulization every 4 (four) hours as needed for wheezing or shortness of breath. 75 mL 2   albuterol (VENTOLIN HFA) 108 (90 Base) MCG/ACT inhaler Inhale 2 puffs into the lungs every 4 (four) hours as needed for wheezing or shortness of breath. 18 g 11   Budeson-Glycopyrrol-Formoterol (BREZTRI AEROSPHERE) 160-9-4.8 MCG/ACT AERO Inhale 2 puffs into the lungs in the morning and at bedtime. 1 each 3   dextromethorphan-guaiFENesin (MUCINEX DM) 30-600 MG 12hr tablet Take 1 tablet by mouth 2 (two) times daily as needed for cough. 40 tablet 0   famotidine (PEPCID) 20 MG tablet TAKE 1 TABLET BY MOUTH TWICE A DAY 485 tablet 1   folic acid (FOLVITE) 1 MG tablet TAKE 1 TABLET BY MOUTH EVERY DAY 90 tablet 1   HYDROcodone-acetaminophen (NORCO/VICODIN) 5-325 MG tablet Take 1 tablet by mouth every 12 (twelve) hours as needed for moderate pain. 45 tablet 0   ipratropium-albuterol (DUONEB) 0.5-2.5 (3) MG/3ML SOLN Take 3 mLs by nebulization every 4 (four) hours  as needed. 360 mL 1   midodrine (PROAMATINE) 5 MG tablet Take 1 tablet (5 mg total) by mouth 3 (three) times daily with meals. 90 tablet 3   Multiple Vitamins-Minerals (MULTI ADULT GUMMIES) CHEW Chew 1 tablet by mouth in the morning and at bedtime.     naphazoline-pheniramine (VISINE) 0.025-0.3 % ophthalmic solution Place 1 drop into both eyes daily as needed for eye  irritation.     pantoprazole (PROTONIX) 40 MG tablet Take 1 tablet (40 mg total) by mouth daily. 30 tablet 1   predniSONE (DELTASONE) 20 MG tablet 4 tablets p.o. daily for 1 week followed by 3 tablets p.o. daily for 1 week followed by 2 tablet p.o. daily for 1 week followed by 1 tablet p.o. daily for 1 week followed by half a tablet p.o. daily for 1 week 75 tablet 0   rivaroxaban (XARELTO) 20 MG TABS tablet Take 1 tablet (20 mg total) by mouth daily with supper. 30 tablet 11   tamsulosin (FLOMAX) 0.4 MG CAPS capsule Take 0.4 mg by mouth at bedtime.     No current facility-administered medications for this visit.    VITAL SIGNS: There were no vitals taken for this visit. There were no vitals filed for this visit.  Estimated body mass index is 30.86 kg/m as calculated from the following:   Height as of 03/11/22: 5' 6.6" (1.692 m).   Weight as of 03/11/22: 194 lb 11.2 oz (88.3 kg).   PERFORMANCE STATUS (ECOG) : 1 - Symptomatic but completely ambulatory   Physical Exam General: NAD Cardiovascular: regular rate and rhythm Pulmonary: clear ant fields Abdomen: soft, nontender, + bowel sounds GU: no suprapubic tenderness Extremities: no edema, no joint deformities Skin: no rashes Neurological:   IMPRESSION: I connected with [name] by phone. No acute distress identified. States he is feeling much better overall. Is trying to remain as active as possible. Denies nausea, vomiting, constipation, or diarrhea. Appetite is good.   Neoplasm related pain [name] reports his pain is better controlled. He has been able to sleep throughout the night which has not been the case for several weeks. Much appreciative of this.   He is taking Hydrocodone 5/325mg  as needed. Does not require around the clock. Averages once daily depending on level of pain.   Education provided on use of stool softeners if he is not having daily bowel movements. Aware if more than 2 days would need to make part of his daily  regimen.   PLAN:  Hydrocodone 5/325 mg one tablet every 12 hours as needed for moderate to severe pain.  Pain contract completed on 2/7 Bowel regimen  Smoking cessation Ongoing goals of care and symptom management support.  I will plan to see patient back in 2-4 weeks in collaboration to other oncology appointments.    Patient expressed understanding and was in agreement with this plan. He also understands that He can call the clinic at any time with any questions, concerns, or complaints.   Any controlled substances utilized were prescribed in the context of palliative care. PDMP has been reviewed.   Time Total: 30 min   Visit consisted of counseling and education dealing with the complex and emotionally intense issues of symptom management and palliative care in the setting of serious and potentially life-threatening illness.Greater than 50%  of this time was spent counseling and coordinating care related to the above assessment and plan.  Alda Lea, AGPCNP-BC  Palliative Medicine Team/[facility]

## 2022-03-18 NOTE — Telephone Encounter (Signed)
Have surgical clearance form. Will fill out and fax back when I'm in surg tomorrow. Nothing further needed

## 2022-03-18 NOTE — Progress Notes (Addendum)
COVID Vaccine Completed:  Yes  Date of COVID positive in last 90 days:  No  PCP - [name], MD Cardiologist - N/A Pulmonologist - Eric Form, NP  Chest x-ray - 01-19-22 1V and 01-12-22 2V EKG - 01-12-22 Epic Stress Test - N/A ECHO - 06-17-19 Epic Cardiac Cath - N/A Pacemaker/ICD device last checked: Spinal Cord Stimulator:  N/A  Bowel Prep - N/A  Sleep Study - N/A CPAP -   Fasting Blood Sugar - N/A Checks Blood Sugar _____ times a day  Last dose of GLP1 agonist-  N/A GLP1 instructions:  N/A   Last dose of SGLT-2 inhibitors-  N/A SGLT-2 instructions: N/A  Blood Thinner Instructions:  Xarelto for PE.  Patient states that he has not been told when to stop.  He checked on it today (03-20-22) and is waiting on response.  Aspirin Instructions: Last Dose:  Activity level:  Unable to climb stairs due to shortness of breath.  Able to perform activities of daily living without  symptoms of chest pain.  Patient has chronic shortness of breath.   Anesthesia review:  COPD, hx of PE  Current chemo treatment for lung CA.  Last treatment 03-08-22  Oxygen 3 to 10 L to keep sats in the 90s.  6L at PAT visit  Stop [place] Lighter 5  Patient denies fever,  and chest pain at PAT appointment  Patient verbalized understanding of instructions that were given to them at the PAT appointment. Patient was also instructed that they will need to review over the PAT instructions again at home before surgery.

## 2022-03-18 NOTE — Patient Instructions (Addendum)
SURGICAL WAITING ROOM VISITATION Patients having surgery or a procedure may have no more than 2 support people in the waiting area - these visitors may rotate.    If the patient needs to stay at the hospital during part of their recovery, the visitor guidelines for inpatient rooms apply. Pre-op nurse will coordinate an appropriate time for 1 support person to accompany patient in pre-op.  This support person may not rotate.    Please refer to the [facility] website for the visitor guidelines for Inpatients (after your surgery is over and you are in a regular room).   Due to an increase in RSV and influenza rates and associated hospitalizations, children ages 54 and under may not visit patients in [place].     Your procedure is scheduled on: 03-27-22   Report to [facility] Main Entrance    Report to admitting at 12:15 PM   Call this number if you have problems the morning of surgery [phone]   Do not eat food or drink liquids ater:After Midnight.          If you have questions, please contact your surgeon's office.   FOLLOW ANY ADDITIONAL PRE OP INSTRUCTIONS YOU RECEIVED FROM YOUR SURGEON'S OFFICE!!!     Oral Hygiene is also important to reduce your risk of infection.                                    Remember - BRUSH YOUR TEETH THE MORNING OF SURGERY WITH YOUR REGULAR TOOTHPASTE   Do NOT smoke after Midnight   Take these medicines the morning of surgery with A SIP OF WATER:   Famotidine  Midodrine  Pantoprazole  Hydrocodone if needed                              You may not have any metal on your body including jewelry, and body piercing             Do not wear  lotions, powders, cologne, or deodorant              Men may shave face and neck.   Do not bring valuables to the hospital. [place].   Contacts, dentures or bridgework may not be worn into surgery.  DO NOT [place]. PHARMACY WILL DISPENSE MEDICATIONS LISTED ON YOUR MEDICATION LIST TO YOU DURING YOUR ADMISSION [place]!    Patients discharged on the day of surgery will not be allowed to drive home.  Someone NEEDS to stay with you for the first 24 hours after anesthesia.   Special Instructions: Bring a copy of your healthcare power of attorney and living will documents the day of surgery if you haven't scanned them before.              Please read over the following fact sheets you were given: IF [place]  If you received a COVID test during your pre-op visit  it is requested that you wear a mask when out in public, stay away from anyone that may not be feeling well and notify your surgeon if you develop symptoms. If you test positive for Covid or have been in contact with anyone that has tested  positive in the last 10 days please notify you surgeon.  [place] - Preparing for Surgery Before surgery, you can play an important role.  Because skin is not sterile, your skin needs to be as free of germs as possible.  You can reduce the number of germs on your skin by washing with CHG (chlorahexidine gluconate) soap before surgery.  CHG is an antiseptic cleaner which kills germs and bonds with the skin to continue killing germs even after washing. Please DO NOT use if you have an allergy to CHG or antibacterial soaps.  If your skin becomes reddened/irritated stop using the CHG and inform your nurse when you arrive at Short Stay. Do not shave (including legs and underarms) for at least 48 hours prior to the first CHG shower.  You may shave your face/neck.  Please follow these instructions carefully:  1.  Shower with CHG Soap the night before surgery and the  morning of surgery.  2.  If you choose to wash your hair, wash your hair first as usual with your normal  shampoo.  3.  After you shampoo, rinse your hair and body  thoroughly to remove the shampoo.                             4.  Use CHG as you would any other liquid soap.  You can apply chg directly to the skin and wash.  Gently with a scrungie or clean washcloth.  5.  Apply the CHG Soap to your body ONLY FROM THE NECK DOWN.   Do   not use on face/ open                           Wound or open sores. Avoid contact with eyes, ears mouth and   genitals (private parts).                       Wash face,  Genitals (private parts) with your normal soap.             6.  Wash thoroughly, paying special attention to the area where your    surgery  will be performed.  7.  Thoroughly rinse your body with warm water from the neck down.  8.  DO NOT shower/wash with your normal soap after using and rinsing off the CHG Soap.                9.  Pat yourself dry with a clean towel.            10.  Wear clean pajamas.            11.  Place clean sheets on your bed the night of your first shower and do not  sleep with pets. Day of Surgery : Do not apply any lotions/deodorants the morning of surgery.  Please wear clean clothes to the hospital/surgery center.  FAILURE TO FOLLOW THESE INSTRUCTIONS MAY RESULT IN THE CANCELLATION OF YOUR SURGERY  PATIENT SIGNATURE_________________________________  NURSE SIGNATURE__________________________________  ________________________________________________________________________

## 2022-03-18 NOTE — Progress Notes (Signed)
Sent message, via epic in basket, requesting orders in epic from surgeon.  

## 2022-03-19 ENCOUNTER — Telehealth: Payer: Self-pay | Admitting: Acute Care

## 2022-03-19 ENCOUNTER — Other Ambulatory Visit: Payer: Self-pay | Admitting: Pulmonary Disease

## 2022-03-19 DIAGNOSIS — I2690 Septic pulmonary embolism without acute cor pulmonale: Secondary | ICD-10-CM

## 2022-03-19 NOTE — Telephone Encounter (Signed)
Fax received from Dr. Abner Greenspan with [facility] to perform a [place] on patient.  Patient needs surgery clearance. Surgery is 03/27/2022. Patient was seen on 02/27/2022 With Eric Form NP. Office protocol is a risk assessment can be sent to surgeon if patient has been seen in 60 days or less.   Sending to Eric Form NP for risk assessment or recommendations if patient needs to be seen in office prior to surgical procedure.

## 2022-03-20 ENCOUNTER — Encounter (HOSPITAL_COMMUNITY): Payer: Self-pay

## 2022-03-20 ENCOUNTER — Encounter (HOSPITAL_COMMUNITY)
Admission: RE | Admit: 2022-03-20 | Discharge: 2022-03-20 | Disposition: A | Discharge status: Home / Self Care | Payer: No Typology Code available for payment source | Source: Ambulatory Visit | Attending: Urology | Admitting: Urology

## 2022-03-20 ENCOUNTER — Other Ambulatory Visit: Payer: Self-pay

## 2022-03-20 ENCOUNTER — Telehealth (INDEPENDENT_AMBULATORY_CARE_PROVIDER_SITE_OTHER): Payer: No Typology Code available for payment source | Admitting: Acute Care

## 2022-03-20 VITALS — BP 109/76 | HR 117 | Temp 97.9°F | Resp 28 | Ht 66.0 in | Wt 187.6 lb

## 2022-03-20 DIAGNOSIS — Z9221 Personal history of antineoplastic chemotherapy: Secondary | ICD-10-CM | POA: Diagnosis not present

## 2022-03-20 DIAGNOSIS — Z01812 Encounter for preprocedural laboratory examination: Secondary | ICD-10-CM | POA: Insufficient documentation

## 2022-03-20 DIAGNOSIS — R0600 Dyspnea, unspecified: Secondary | ICD-10-CM | POA: Diagnosis not present

## 2022-03-20 DIAGNOSIS — C3490 Malignant neoplasm of unspecified part of unspecified bronchus or lung: Secondary | ICD-10-CM

## 2022-03-20 HISTORY — DX: Dyspnea, unspecified: R06.00

## 2022-03-20 HISTORY — DX: Dependence on supplemental oxygen: Z99.81

## 2022-03-20 LAB — BASIC METABOLIC PANEL
Anion gap: 8 (ref 5–15)
BUN: 14 mg/dL (ref 8–23)
CO2: 30 mmol/L (ref 22–32)
Calcium: 9 mg/dL (ref 8.9–10.3)
Chloride: 98 mmol/L (ref 98–111)
Creatinine, Ser: 1.12 mg/dL (ref 0.61–1.24)
GFR, Estimated: >60 mL/min (ref >60)
Glucose, Bld: 155 mg/dL — ABNORMAL HIGH (ref 70–99)
Potassium: 4.5 mmol/L (ref 3.5–5.1)
Sodium: 136 mmol/L (ref 135–145)

## 2022-03-20 NOTE — Progress Notes (Signed)
Virtual Visit via Video Note  I connected with Philip [name] on 03/20/22 at  9:30 AM EST by a video enabled telemedicine application and verified that I am speaking with the correct person using two identifiers.  Location: Patient: At home Provider: [place], [place], [place], [address]    I discussed the limitations of evaluation and management by telemedicine and the availability of in person appointments. The patient expressed understanding and agreed to proceed.  Philip [name] is a 67 y.o. male  former smoker  ( 01/2022) with a  90 pack year smoking history with recurrent  non-small cell lung cancer (01/2019 then again 12/2020).History of  PE 03/2020, and recent hospitalization 02/2022  for pneumonia vs pneumonitis. He is followed by Dr. [name] and [place].  Prior Therapy 1) Weekly concurrent chemoradiation with carboplatin for an AUC of 2 and paclitaxel 45 mg/m2. First dose starting 01/30/2019.   Status post 6 cycles. 2) Consolidation immunotherapy with Imfinzi 1500 mg IV every 4 weeks. First dose expected on 04/19/2019.  Status post 13 cycles. 3) disease recurrence in October 2022.   CURRENT THERAPY: Systemic chemotherapy with carboplatin for AUC of 5, Alimta 500 Mg/M2 and Keytruda 200 Mg IV every 3 weeks.  First dose January 01, 2021.  Status post 18 cycles.  He had hypersensitivity reaction to carboplatin and this was discontinued after cycle #2.  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 Therapy resumed 02/19/2022  Most recent Ct chest 03/12/2022 shows no evidence of disease progression  Pain control Hydrocodone 5/325mg  as needed  per palliative care   History of Present Illness: Pt. Presents for follow up after Ct chest to ensure resolution of infectious or inflammatory pneumonitis on 02/05/2022 CTA Chest. He was hospitalized and  treated with Doxycycline and prednisone  per Dr. Julien Nordmann. He completed  treatment. Repeat CT chest done 03/12/2022 shows interval clearing of previously seen patchy bilateral ground glass concerning for ongoing infectious or inflammatory pneumonitis. He states he feels better, He is wearing oxygen at 5 L , with sats of 97% today. He is followed by Palliative care for pain management He has resumed  his maintenance chemo treatments.  Pt has had issues with low BP. We restarted his midodrine 02/27/2022 and he states his BP has been better, in the 1 teens to 220 systolic. He has had no further episodes of dizziness,   Pt. Is scheduled for Surgery next Friday 03/28/2022 >> new renal stent>> will need come off xarelto for procedure, but should be bridged with Lovenox to minimize time off therapy. He will be high risk for pulmonary complications due to his chronic respiratory failure , lung cancer and post radiation pulmonary changes as well as bronchiectasis.     Observations/Objective: CT Chest 03/12/2022 Interval clearing of previously seen patchy bilateral ground-glass. 2. Post treatment scarring in the medial aspects of both hemithoraces. No evidence of recurrent or metastatic disease. 3. 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 If further evaluation is desired, high resolution chest CT without contrast could be performed. 4. Trace right pleural fluid. 5. Left adrenal adenoma. 6. Aortic atherosclerosis (ICD10-I70.0). Coronary artery calcification. 7. Enlarged pulmonic trunk, indicative of pulmonary arterial hypertension.    Assessment and Plan: Resolution of pneumonitis per CT imaging 03/12/2022 Continued dyspnea with increased oxygen needs Recent Ct chest with Mild basilar predominant subpleural reticulation and ground-glass, indicative of fibrotic interstitial  lung disease Chronic respiratory failure with hypoxia Last PFT's 2020 Plan We will  schedule PFT's Follow up with Dr. [name] after PFT's >> 6 weeks from now Your last CT Chest showed that there was some scarring that is most likely contributing to your shortness of breath You need to wear continuous oxygen , as you need more oxygen flow than the portable unit can provide.  Please make sure your oxygen saturations are > 88% at all times.  Increase oxygen as needed to maintain oxygen at > 88%. Consider additional prednisone therapy at follow up Use Inogen  when you are sitting only as you need more than 4 L pulsed with activity. Use tanks with continuous flow for any activity. Follow up with Dr. Earlie Server as scheduled.   Hypotension improved on Midodrine 5 mg TID Plan Continue current regimen Check BP at intervals and record. Call for any drops in BP or seek emergency care  Follow Up Instructions: PFT's  with follow up with Dr. [name] to evaluate results in 6 weeks  Will need surgical clearance for renal stents Pt is high risk for pulmonary complications     I discussed the assessment and treatment plan with the patient. The patient was provided an opportunity to ask questions and all were answered. The patient agreed with the plan and demonstrated an understanding of the instructions.   The patient was advised to call back or seek an in-person evaluation if the symptoms worsen or if the condition fails to improve as anticipated.  I provided 30 minutes of video visit -face-to-face time during this encounter.   Magdalen Spatz, NP

## 2022-03-20 NOTE — Progress Notes (Incomplete)
Virtual Visit via Video Note  I connected with Philip [name] on 03/20/22 at  9:30 AM EST by a video enabled telemedicine application and verified that I am speaking with the correct person using two identifiers.  Location: Patient: At home Provider: [place], [place], [place], [address]    I discussed the limitations of evaluation and management by telemedicine and the availability of in person appointments. The patient expressed understanding and agreed to proceed.  Philip [name] is a 67 y.o. male  former smoker  ( 01/2022) with a  90 pack year smoking history with recurrent  non-small cell lung cancer (01/2019 then again 12/2020). .  Prior Therapy 1) Weekly concurrent chemoradiation with carboplatin for an AUC of 2 and paclitaxel 45 mg/m2. First dose starting 01/30/2019.   Status post 6 cycles. 2) Consolidation immunotherapy with Imfinzi 1500 mg IV every 4 weeks. First dose expected on 04/19/2019.  Status post 13 cycles. 3) disease recurrence in October 2022.   CURRENT THERAPY: Systemic chemotherapy with carboplatin for AUC of 5, Alimta 500 Mg/M2 and Keytruda 200 Mg IV every 3 weeks.  First dose January 01, 2021.  Status post 18 cycles.  He had hypersensitivity reaction to carboplatin and this was discontinued after cycle #2.  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 Therapy resumed 02/19/2022  Most recent Ct chest 03/12/2022 shows no evidence of disease progression  Pain control Hydrocodone 5/325mg  as needed  per palliative care      History of Present Illness: Pt. Presents for follow up after Ct chest to ensure resolution of infectious or inflammatory pneumonitis on 02/05/2022 CTA Chest. He was hospitalized and  treated with Doxycycline and prednisone  per Dr. Julien Nordmann. He completed treatment. Repeat CT chest done 03/12/2022 shows interval clearing of previously seen patchy bilateral ground glass concerning for  ongoing infectious or inflammatory pneumonitis. He states he feels better, He is wearing oxygen at 5 L [place], with sats of 97% today. He is followed by Palliative care for pain management He has restarted his Alimta treatments.  Pt has had issues with low BP. We restarted his midodrine and he states his BP has been better, in the 1 teens to 194 systolic. He has had no further episodes of dizziness,   Pt. Is scheduled for Surgery next Friday 03/28/2022 >> new renal stent>> will need come off xarelto for procedure, but should be bridged with Lovenox to minimize time off therapy. He will be high risk for pulmonary complications due to his chronic respiratory failure , lung cancer and post radiation pulmonary changes as well as bronchiectasis.     Observations/Objective: CT Chest 03/12/2022 Interval clearing of previously seen patchy bilateral ground-glass. 2. Post treatment scarring in the medial aspects of both hemithoraces. No evidence of recurrent or metastatic disease. 3. 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 If further evaluation is desired, high resolution chest CT without contrast could be performed. 4. Trace right pleural fluid. 5. Left adrenal adenoma. 6. Aortic atherosclerosis (ICD10-I70.0). Coronary artery calcification. 7. Enlarged pulmonic trunk, indicative of pulmonary arterial hypertension.    Assessment and Plan: Resolution of   Follow Up Instructions:    I discussed the assessment and treatment plan with the patient. The patient was provided an opportunity to ask questions and all were answered. The patient agreed with the plan and demonstrated an understanding of the instructions.  The patient was advised to call back or seek an in-person evaluation if the symptoms worsen or if the condition fails to improve as anticipated.  I provided *** minutes of  non-face-to-face time during this encounter.   Magdalen Spatz, NP

## 2022-03-20 NOTE — Patient Instructions (Addendum)
It is good to see you today. Your pneumonia/ pneumonitis  has cleared on the current CT Chest.  We will schedule PFT's Follow up with Dr. [name] after PFT's >> 6 weeks from now Your last CT Chest showed that there was some scarring that is most likely contributing to your shortness of breath You need to wear continuous oxygen , as you need more oxygen flow than the portable unit can provide.  Please make sure your oxygen saturations are > 88% at all times.  Increase oxygen as needed to maintain oxygen at > 88%. Use Inogen  when you are sitting only as you need more than 4 L pulsed with activity. Use tanks with continuous flow for any activity. Follow up with Dr. Earlie Server as scheduled.

## 2022-03-21 ENCOUNTER — Encounter: Payer: Self-pay | Admitting: Acute Care

## 2022-03-23 ENCOUNTER — Encounter (HOSPITAL_COMMUNITY): Payer: Self-pay | Admitting: Physician Assistant

## 2022-03-23 ENCOUNTER — Telehealth: Payer: Self-pay | Admitting: Pulmonary Disease

## 2022-03-23 NOTE — Telephone Encounter (Signed)
Pt. Wanted to let Dr. [name] know he needs to be off blood thinner for 3 days prior to surgery on Friday please advise pt. If he needs to know any thing before surgery

## 2022-03-23 NOTE — Telephone Encounter (Signed)
Refer to encounter from 2/15.

## 2022-03-23 NOTE — Telephone Encounter (Signed)
Called Zona but she did not answer. Left message for her to call us back.   Patient was seen by Judson Roch on 03/20/22.    "Follow Up Instructions: PFT's  with follow up with Dr. [name] to evaluate results in 6 weeks  Will need surgical clearance for renal stents Pt is high risk for pulmonary complications      I discussed the assessment and treatment plan with the patient. The patient was provided an opportunity to ask questions and all were answered. The patient agreed with the plan and demonstrated an understanding of the instructions.   The patient was advised to call back or seek an in-person evaluation if the symptoms worsen or if the condition fails to improve as anticipated."  Judson Roch, does the patient need to complete PFTs before surgery?

## 2022-03-23 NOTE — Telephone Encounter (Signed)
Also wants to know if needs to make F/u apt with Dr. [name] at end of March looked at AVS from last visit with Elie Confer

## 2022-03-24 ENCOUNTER — Telehealth: Payer: Self-pay | Admitting: Pulmonary Disease

## 2022-03-24 ENCOUNTER — Telehealth: Payer: Self-pay | Admitting: Acute Care

## 2022-03-24 NOTE — Telephone Encounter (Signed)
[facility] calling for [facility] clearance due to meds PT is taking (See last signed encounter) [facility] @ [phone]. 2ND ccall in, they said. Asked for Cherina.

## 2022-03-24 NOTE — Telephone Encounter (Signed)
Calling again. State it is Urgent at this point.

## 2022-03-24 NOTE — Telephone Encounter (Signed)
Pt. Needs new order sent to [facility] for o2 to go up to 10 liter tank and

## 2022-03-24 NOTE — Telephone Encounter (Signed)
Zona from [facility] states patient's length of surgery is 15 minutes. Sedation is general. [place] phone number is [phone].

## 2022-03-24 NOTE — Telephone Encounter (Signed)
Magdalen Spatz, NP  to Valerie Salts, CMA  Hunsucker, Bonna Gains, MD     03/23/22 12:36 PM  Cherina. Can he be added to my or [place]ers schedule tomorrow or Wednesday. I am in pulmonary rehab so it can be added to my schedule there as a video visit for tomorrow at 10 am if the patient is available.  Marland Kitchen He is a sick guy and needs a formal eval. Can you find out from the requesting MD the length of surgery and type of sedation. Thanks   I called [facility] and was placed on hold for over 12 minutes  I left msg with person I spoke with, that this pt needs appt for clearance  I also asked that they call back and provide [place] with length of surgery and type of sedation   Will await a call back  In the meantime I called and scheduled the pt with Clarise Cruz for tomorrow for clearance.

## 2022-03-25 ENCOUNTER — Ambulatory Visit (INDEPENDENT_AMBULATORY_CARE_PROVIDER_SITE_OTHER): Payer: No Typology Code available for payment source | Admitting: Acute Care

## 2022-03-25 ENCOUNTER — Ambulatory Visit (INDEPENDENT_AMBULATORY_CARE_PROVIDER_SITE_OTHER): Payer: No Typology Code available for payment source

## 2022-03-25 ENCOUNTER — Telehealth: Payer: Self-pay | Admitting: *Deleted

## 2022-03-25 ENCOUNTER — Encounter: Payer: Self-pay | Admitting: Acute Care

## 2022-03-25 VITALS — BP 112/80 | HR 111 | Temp 97.7°F | Ht 66.0 in | Wt 194.8 lb

## 2022-03-25 DIAGNOSIS — C3491 Malignant neoplasm of unspecified part of right bronchus or lung: Secondary | ICD-10-CM

## 2022-03-25 DIAGNOSIS — J449 Chronic obstructive pulmonary disease, unspecified: Secondary | ICD-10-CM | POA: Diagnosis not present

## 2022-03-25 DIAGNOSIS — J069 Acute upper respiratory infection, unspecified: Secondary | ICD-10-CM

## 2022-03-25 DIAGNOSIS — R0609 Other forms of dyspnea: Secondary | ICD-10-CM

## 2022-03-25 MED ORDER — PREDNISONE 10 MG PO TABS: ORAL_TABLET | ORAL | 0 refills | Status: DC

## 2022-03-25 NOTE — Telephone Encounter (Signed)
FYI for Sarah-- length of surgery is 15 minutes. Sedation is general   Please forward this back to me once your risk assessment from today's visit is done so I can fax note to surgeon.

## 2022-03-25 NOTE — Progress Notes (Signed)
History of Present Illness Philip [name] is a 67 y.o. male  former smoker  ( 01/2022) with a  90 pack year smoking history with recurrent  non-small cell lung cancer (01/2019 then again 12/2020).History of  PE 03/2020, and recent hospitalization 02/2022  for pneumonia vs pneumonitis. He is followed by Dr. [name] and [place].   Prior Therapy 1) Weekly concurrent chemoradiation with carboplatin for an AUC of 2 and paclitaxel 45 mg/m2. First dose starting 01/30/2019.   Status post 6 cycles. 2) Consolidation immunotherapy with Imfinzi 1500 mg IV every 4 weeks. First dose expected on 04/19/2019.  Status post 13 cycles. 3) disease recurrence in October 2022.   CURRENT THERAPY: Systemic chemotherapy with carboplatin for AUC of 5, Alimta 500 Mg/M2 and Keytruda 200 Mg IV every 3 weeks.  First dose January 01, 2021.  Status post 18 cycles.  He had hypersensitivity reaction to carboplatin and this was discontinued after cycle #2.  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 Palliative Therapy resumed 1/18/2024Nat Math not restarted)    Most recent Ct chest 03/12/2022 shows no evidence of disease progression   Pain control Hydrocodone 5/'325mg'$  as needed  per palliative care   03/28/2022 Pt. Presents for surgical clearance for Cystoscopy, right retrograde Pyelogram, Right Stent exchange. He is having this procedure done 03/27/2022 ( Tomorrow) by Dr. Abner Greenspan. He presents for surgical risk assessment.   1) RISK FOR PROLONGED MECHANICAL VENTILAION - > 48h  1A) Arozullah - Prolonged mech ventilation risk Arozullah Postperative Pulmonary Risk Score - for mech ventilation dependence >48h [employer], Ann Surg 2000, major non-cardiac surgery) Comment Score  Type of surgery - abd ao aneurysm (27), thoracic (21), neurosurgery / upper abdominal / vascular (21), neck (11) Urology 0  Emergency Surgery - (11) No 0  ALbumin < 3 or poor nutritional state -  (9) 3.3 no 0  BUN > 30 -  (8) 23, no 0  Partial or completely dependent functional status - (7) No 0  COPD -  (6) Yes 6  Age - 60 to 80 (4), > 70  (6) 66 6  TOTAL  12  Risk Stratifcation scores  - < 10 (0.5%), 11-19 (1.8%), 20-27 (4.2%), 28-40 (10.1%), >40 (26.6%)  1.8% risk      1B) GUPTA - Prolonged Mech Vent Risk Score source Risk  Guptal post op prolonged mech ventilation > 48h or reintubation < 30 days - ACS 2007-2008 dataset - http://lewis-perez.info/ Functional status  Independent  Partially dependent  Totally dependent ASA class  1: normal healthy patient  2: mild systemic disease  3: severe systemic disease  4: severe systemic disease that is a constant threat to life (i.e., patient could die acutely without intervention)  5: moribund, not expected to survive without surgery Sepsis  None  Preoperative systemic inflammatory response syndrome  Preoperative sepsis  Preoperative septic shock Emergency case  No  Yes Type of procedure  Urology 3.2 % Risk of mechanical ventilation for >48 hrs after surgery, or unplanned intubation ?30 days of surgery    2) RISK FOR POST OP PNEUMONIA Score source Risk  Lyndel Safe - Post Op Pnemounia risk  [email or web address] Age 55 years COPD  COPD causing functional disability or hospitalization, or FEV1 <75%  No  Yes Functional status   Independent  Partially dependent  Totally dependent ASA class   1: normal healthy patient  2: mild systemic disease  3: severe systemic disease  4: severe  systemic disease that is a constant threat to life (i.e., patient could die acutely without intervention)  5: moribund, not expected to survive without surgery Sepsis   None  Preoperative systemic inflammatory response syndrome  Preoperative sepsis  Preoperative septic shock Smoking within last year   No  Yes Type of  procedure  Urology 4.4 % Risk of postoperative pneumonia    R3) ISK FOR ANY POST-OP PULMONARY COMPLICATION Score source Risk  CANET/ARISCAT Score - risk for ANY/ALl pulmonary complications - > risk of in-hospital post-op pulmonary complications (composite including respiratory failure, respiratory infection, pleural effusion, atelectasis, pneumothorax, bronchospasm, aspiration pneumonitis) [email or web address] - based on age, anemia, pulse ox, resp infection prior 30d, incision site, duration of surgery, and emergency v elective surgery Age, years  ?500  51-80+3  >80+16 Preoperative SpO?  ?96%0  91-95%+8  ?90%+24 Respiratory infection in the last month Either upper or lower (i.e., URI, bronchitis, pneumonia), with fever and antibiotic treatment  No0  Yes+17 Preoperative anemia (Hgb ?10 g/dL)  No0  Yes+11 Surgical incision  Peripheral0  Upper abdominal+15  Intrathoracic+24 Duration of surgery  <2 hrs0  2-3 hrs+16  >3 hrs+23 Emergency procedure  No0  Yes+8 28 points ARISCAT Score Intermediate risk 13.3% risk of in-hospital post-op pulmonary complications (composite including respiratory failure, respiratory infection, pleural effusion, atelectasis, pneumothorax, bronchospasm, aspiration pneumonitis)      Risk ameliorating factors are  as noted below   Major Pulmonary risks identified in the multifactorial risk analysis are but not limited to a) pneumonia; b) recurrent intubation risk; c) prolonged or recurrent acute respiratory failure needing mechanical ventilation; d) prolonged hospitalization; e) DVT/Pulmonary embolism; f) Acute Pulmonary edema  Recommend Use Breztri and albuterol neb morning of surgery, oxygen at all time with sats > 92% at all times  1. Short duration of surgery as much as possible and avoid paralytic if possible 2. Recovery in step down or ICU with Pulmonary consultation if  difficulty with extubation 3. DVT prophylaxis, and resumption chronic of blood thinner as soon as safe  4. Aggressive pulmonary toilet with o2,scheduled  bronchodilatation, and incentive spirometry and early ambulation  Pt is oxygen dependent at baseline due to his current lung cancer, history of PE and pulmonary fibrosis. He has been hospitalized recently for pneumonitis vs pneumonia. CT Chest shows this has resolved. He is immunocompromised and is at increased risk of pulmonary complications with surgery  as his baseline pulmonary status is compromised.  We will not clear as patient has increased oxygen demand since coming off prednisone taper.   Surgery is Friday . Jennye Moccasin has not been held . He has  increased oxygen demand. He is requiring 5-6 L at rest and 5 with sleep. He desaturates to as low as the 40's with minimal exertion. He came off steroids 03/11/2022. I suspect this change is due to his fibrotic lungs . He does not want to restart on a high dose. We will do a prednisone taper and have him stay at 10 mg daily. We will reassess in 1 week. He is to call me if he is no better after the prednisone.   Test Results:     Latest Ref Rng & Units 03/11/2022   11:03 AM 02/19/2022    1:53 PM 02/05/2022   10:48 AM  CBC  WBC 4.0 - 10.5 K/uL 6.4  16.7  9.2   Hemoglobin 13.0 - 17.0 g/dL 13.8  14.3  13.5   Hematocrit 39.0 - 52.0 % 41.9  42.4  39.4  Platelets 150 - 400 K/uL 254  217  210        Latest Ref Rng & Units 03/20/2022   11:39 AM 03/11/2022   11:03 AM 02/19/2022    1:53 PM  BMP  Glucose 70 - 99 mg/dL 155  100  173   BUN 8 - 23 mg/dL '14  18  23   '$ Creatinine 0.61 - 1.24 mg/dL 1.12  0.99  1.09   Sodium 135 - 145 mmol/L 136  136  136   Potassium 3.5 - 5.1 mmol/L 4.5  3.4  3.7   Chloride 98 - 111 mmol/L 98  96  100   CO2 22 - 32 mmol/L 30  34  27   Calcium 8.9 - 10.3 mg/dL 9.0  9.1  9.3     BNP    Component Value Date/Time   BNP 104.1 (H) 01/12/2022 0840    ProBNP No results found  for: "PROBNP"  PFT    Component Value Date/Time   FEV1PRE 2.07 12/21/2018 1238   FEV1POST 2.28 12/21/2018 1238   FVCPRE 3.07 12/21/2018 1238   FVCPOST 3.23 12/21/2018 1238   TLC 5.35 12/21/2018 1238   DLCOUNC 15.47 12/21/2018 1238   PREFEV1FVCRT 67 12/21/2018 1238   PSTFEV1FVCRT 71 12/21/2018 1238    DG Chest 2 View  Result Date: 03/25/2022 CLINICAL DATA:  COPD, increased oxygen demand EXAM: CHEST - 2 VIEW COMPARISON:  01/19/2022, 03/11/2022 FINDINGS: Frontal and lateral views of the chest demonstrate patchy bilateral airspace disease superimposed upon background emphysema, scarring, and bronchiectasis. Chronic elevation of the right hemidiaphragm. No effusion or pneumothorax. No acute bony abnormalities. IMPRESSION: 1. Patchy bilateral airspace disease superimposed upon background scarring and emphysema. Favor multifocal infection over edema. Electronically Signed   By: Randa Ngo M.D.   On: 03/25/2022 15:22   CT CHEST WO CONTRAST  Result Date: 03/12/2022 CLINICAL DATA:  Pneumonia.  Lung cancer.  * Tracking Code: BO * EXAM: CT CHEST WITHOUT CONTRAST TECHNIQUE: Multidetector CT imaging of the chest was performed following the standard protocol without IV contrast. RADIATION DOSE REDUCTION: This exam was performed according to the departmental dose-optimization program which includes automated exposure control, adjustment of the mA and/or kV according to patient size and/or use of iterative reconstruction technique. COMPARISON:  02/05/2022. FINDINGS: Cardiovascular: Atherosclerotic calcification of the aorta and coronary arteries. Enlarged pulmonic trunk. Heart is at the upper limits of normal in size to mildly enlarged. No pericardial effusion. Mediastinum/Nodes: Mediastinal lymph nodes measure up to 10 mm in the prevascular space (2/37), unchanged. Hilar regions are difficult to evaluate without IV contrast. No axillary adenopathy. Esophagus is grossly unremarkable. Lungs/Pleura:  Centrilobular and paraseptal emphysema. Post treatment pulmonary retraction and bronchiectasis in the medial aspects of both hemithoraces. Mild basilar predominant subpleural reticulation and ground-glass bilaterally. Interval clearing of previously seen patchy pulmonary parenchymal ground-glass. Trace right pleural fluid. Airway is grossly unremarkable. Upper Abdomen: Visualized portions of the liver and gallbladder are unremarkable. Low-attenuation thickening of the right adrenal gland. 2.5 cm left adrenal nodule measures -8 Hounsfield units. No specific follow-up necessary. Visualized portions of the kidneys, spleen, pancreas, stomach and bowel are otherwise unremarkable. No upper abdominal adenopathy. Musculoskeletal: Degenerative changes in the spine. No worrisome lytic or sclerotic lesions. IMPRESSION: 1. Interval clearing of previously seen patchy bilateral ground-glass. 2. Post treatment scarring in the medial aspects of both hemithoraces. No evidence of recurrent or metastatic disease. 3. Mild basilar predominant subpleural reticulation and ground-glass, indicative of fibrotic interstitial lung disease superimposed on  Emphysema (ICD10-J43.9). If further evaluation is desired, high resolution chest CT without contrast could be performed. 4. Trace right pleural fluid. 5. Left adrenal adenoma. 6. Aortic atherosclerosis (ICD10-I70.0). Coronary artery calcification. 7. Enlarged pulmonic trunk, indicative of pulmonary arterial hypertension. Electronically Signed   By: Lorin Picket M.D.   On: 03/12/2022 11:25     Past medical hx Past Medical History:  Diagnosis Date   COPD (chronic obstructive pulmonary disease) (HCC)    Dyspnea    GERD (gastroesophageal reflux disease)    Headache    migraines   Hyperlipidemia    lung ca dx'd 12/2018   On home oxygen therapy    3 to 10 L   Pneumonia    Pulmonary embolus (HCC)    Tobacco abuse      Social History   Tobacco Use   Smoking status: Former     Packs/day: 2.00    Years: 45.00    Total pack years: 90.00    Types: Cigarettes    Start date: 07/04/1970    Quit date: 12/2021    Years since quitting: 0.3   Smokeless tobacco: Former    Types: Chew   Tobacco comments:    Smoking 2 packs of cigarettes a week. 10/14/2021 Tay    Smoking is on and off, will go a week without smoking then start up and smoke half a pack a day  Vaping Use   Vaping Use: Never used  Substance Use Topics   Alcohol use: Yes    Comment: hasn't drank in one month   Drug use: Not Currently    Types: Marijuana    Mr.Coghlan reports that he quit smoking about 3 months ago. His smoking use included cigarettes. He started smoking about 51 years ago. He has a 90.00 pack-year smoking history. He has quit using smokeless tobacco.  His smokeless tobacco use included chew. He reports current alcohol use. He reports that he does not currently use drugs after having used the following drugs: Marijuana.  Tobacco Cessation: Counseling given: Not Answered Tobacco comments: Smoking 2 packs of cigarettes a week. 10/14/2021 Tay Smoking is on and off, will go a week without smoking then start up and smoke half a pack a day Patient was counseled not to smoke  Past surgical hx, Family hx, Social hx all reviewed.  Current Outpatient Medications on File Prior to Visit  Medication Sig   albuterol (PROVENTIL) (2.5 MG/3ML) 0.083% nebulizer solution Take 3 mLs (2.5 mg total) by nebulization every 4 (four) hours as needed for wheezing or shortness of breath.   albuterol (VENTOLIN HFA) 108 (90 Base) MCG/ACT inhaler Inhale 2 puffs into the lungs every 4 (four) hours as needed for wheezing or shortness of breath.   Budeson-Glycopyrrol-Formoterol (BREZTRI AEROSPHERE) 160-9-4.8 MCG/ACT AERO Inhale 2 puffs into the lungs in the morning and at bedtime.   dextromethorphan-guaiFENesin (MUCINEX DM) 30-600 MG 12hr tablet Take 1 tablet by mouth 2 (two) times daily as needed for cough.   fluconazole  (DIFLUCAN) 100 MG tablet Take 100 mg by mouth daily.   folic acid (FOLVITE) 1 MG tablet TAKE 1 TABLET BY MOUTH EVERY DAY   HYDROcodone-acetaminophen (NORCO/VICODIN) 5-325 MG tablet Take 1 tablet by mouth every 12 (twelve) hours as needed for moderate pain.   ipratropium-albuterol (DUONEB) 0.5-2.5 (3) MG/3ML SOLN Take 3 mLs by nebulization every 4 (four) hours as needed.   midodrine (PROAMATINE) 5 MG tablet Take 1 tablet (5 mg total) by mouth 3 (three) times daily with meals.  Multiple Vitamins-Minerals (MULTI ADULT GUMMIES) CHEW Chew 1 tablet by mouth in the morning and at bedtime.   naphazoline-pheniramine (VISINE) 0.025-0.3 % ophthalmic solution Place 1 drop into both eyes daily as needed for eye irritation.   pantoprazole (PROTONIX) 40 MG tablet Take 1 tablet (40 mg total) by mouth daily.   tamsulosin (FLOMAX) 0.4 MG CAPS capsule Take 0.4 mg by mouth at bedtime.   XARELTO 20 MG TABS tablet TAKE 1 TABLET BY MOUTH DAILY WITH SUPPER.   No current facility-administered medications on file prior to visit.     Allergies  Allergen Reactions   Carboplatin Anaphylaxis, Shortness Of Breath, Cough and Hypertension    On dose 8. Became short of breath. Symptom management was called. Received benadryl, solumedrol, Pepcid and fluids. Medication discontinued.    Klonopin [Clonazepam] Other (See Comments)    nervous   Norco [Hydrocodone-Acetaminophen] Other (See Comments)    Made pt feel jittery     Review Of Systems:  Constitutional:   No  weight loss, night sweats,  Fevers, chills, ++fatigue, or  lassitude.  HEENT:   No headaches,  Difficulty swallowing,  Tooth/dental problems, or  Sore throat,                No sneezing, itching, ear ache, nasal congestion, post nasal drip,   CV:  No chest pain,  Orthopnea, PND, swelling in lower extremities, anasarca, dizziness, palpitations, syncope.   GI  No heartburn, indigestion, abdominal pain, nausea, vomiting, diarrhea, change in bowel habits, loss of  appetite, bloody stools.   Resp: ++ shortness of breath with minimal exertion or at rest.  No excess mucus, no productive cough,  No non-productive cough,  No coughing up of blood.  No change in color of mucus.  No wheezing.  No chest wall deformity  Skin: no rash or lesions.  GU: no dysuria, change in color of urine, no urgency or frequency.  No flank pain, no hematuria   MS:  No joint pain or swelling.  No decreased range of motion.  No back pain.  Psych:  No change in mood or affect. No depression or anxiety.  No memory loss.   Vital Signs BP 112/80 (BP Location: Right Arm, Patient Position: Sitting, Cuff Size: Large)   Pulse (!) 111   Temp 97.7 F (36.5 C) (Oral)   Ht '5\' 6"'$  (1.676 m)   Wt 194 lb 12.8 oz (88.4 kg)   BMI 31.44 kg/m    Physical Exam:  General- No distress,  A&Ox3, pleasant ENT: No sinus tenderness, TM clear, pale nasal mucosa, no oral exudate,no post nasal drip, no LAN, facial edema secondary to previous prednisone Cardiac: S1, S2, regular rate and rhythm, no murmur Chest: No wheeze/ rales/ dullness; no accessory muscle use, no nasal flaring, no sternal retractions,  crackles bilateral lower lobes Abd.: Soft Non-tender, nondistended, bowel sounds positive,Body mass index is 31.44 kg/m.  Ext: No clubbing cyanosis, edema, slight lower extremity edema bilaterally Neuro: Alert and oriented x 3, moving all extremities x 4, extremely deconditioned Skin: No rashes, warm and dry Psych: normal mood and behavior, appropriately concerned about his new increased oxygen demand   Assessment/Plan Increased oxygen demand since completing prednisone taper 03/11/2022 Currently requiring 5 to 6 L at rest Suspect this is related to prednisone responsive scarring/fibrotic changes or new infection  Plan CXR today, stat please  We cannot clear you for surgery with your increased oxygen demand. Continue Breztri 2 puffs in the morning and 2 puffs in  the evening Rinse mouth after  use Continue albuterol nebs and albuterol inhaler as needed for shortness of breath or wheezing Prednisone taper; 10 mg tablets: 4 tabs x 2 days, 3 tabs x 2 days, 2 tabs x 2 days 1 tab there after until we follow up in the office. Call of you find this is not helping.  Wear oxygen at 5 L at rest and 6 L with exertion. We will place an order for an 10 L oxygen concentrator for home. Follow up in 1 week with Hunsucker or [place] NP.  Seek emergency care if you cannot get oxygen saturations up above 88% with continuous oxygen.  Please contact office for sooner follow up if symptoms do not improve or worsen or seek emergency care    Addendum CXR came back as + for possible infiltrates. Doxycycline 100 mg BID  has been sent in and patient has been called and asked to start medication today. Doxycycline resolved previous pneumonitis and is better tolerated by GI, so I have chosen to repeat dosing. I have asked him to take with a full glass of water with medication and use sun block if he is in the sun. He verbalized understanding . Pt. Has follow up with Dr. [name] 04/08/2022.   CXR 03/25/2022 Patchy bilateral airspace disease superimposed upon background scarring and emphysema. Favor multifocal infection over edema.  I spent 45 minutes dedicated to the care of this patient on the date of this encounter to include pre-visit review of records, face-to-face time with the patient discussing conditions above, post visit ordering of testing, clinical documentation with the electronic health record, making appropriate referrals as documented, and communicating necessary information to the patient's healthcare team.     Magdalen Spatz, NP 03/28/2022  4:30 PM

## 2022-03-25 NOTE — Telephone Encounter (Signed)
[facility] and spoke with Zona (surgery scheduler), advised her that per Eric Form NP, she cannot clear him for surgery d/t his increase oxygen demand (currently needing 5-8L of oxygen to keep sats >88-90%).  I let her know that he was sent home on Prednisone and will come back in 1 week for re evaluation and we will call and update them at that time.  She verbalized understanding.  Nothing further needed.

## 2022-03-25 NOTE — Telephone Encounter (Signed)
Order for this was placed at today's visit.

## 2022-03-25 NOTE — Progress Notes (Signed)
States he received his flu vaccine when he got his well visit.

## 2022-03-25 NOTE — Patient Instructions (Addendum)
It is good to see you today. CXR today, stat please  We cannot clear you for surgery with your increased oxygen demand. Continue Breztri 2 puffs in the morning and 2 puffs in the evening Rinse mouth after use Continue albuterol nebs and albuterol inhaler as needed for shortness of breath or wheezing Prednisone taper; 10 mg tablets: 4 tabs x 2 days, 3 tabs x 2 days, 2 tabs x 2 days 1 tab there after until we follow up in the office. Call of you find this is not helping.  Wear oxygen at 5 L at rest and 6 L with exertion. We will place an order for an 10 L oxygen concentrator for home. Follow up in 1 week with Hunsucker or Maybelle Depaoli NP.  Seek emergency care if you cannot get oxygen saturations up above 88% with continuous oxygen.  Please contact office for sooner follow up if symptoms do not improve or worsen or seek emergency care

## 2022-03-26 ENCOUNTER — Encounter (HOSPITAL_COMMUNITY): Payer: No Typology Code available for payment source

## 2022-03-26 NOTE — Telephone Encounter (Signed)
See phone note dated 03/25/22- pt not cleared for surgery at this time.

## 2022-03-26 NOTE — Progress Notes (Signed)
Anesthesia Chart Review   Case: [id number] Date/Time: 03/27/22 1254   Procedure: CYSTOSCOPY WITH RIGHT RETROGRADE PYELOGRAM/ RIGHT STENT EXCHANGE (Right) - 15 MINUTES NEEDED FOR CASE   Anesthesia type: General   Pre-op diagnosis: RIGHT URETEROPELVIC JUNCTION OBSTRUCTION   Location: [facility] / [facility]   Surgeons: Janith Lima, MD       DISCUSSION:67 y.o. former smoker with h/o non-small cell lung cancer, COPD on home O2, PE, right ureteropelvic junction obstruction scheduled for above procedure 03/27/22 with Dr. Rexene Alberts.   Pt last seen by pulmonology 03/25/22. Per OV note, "Pt is oxygen dependent at baseline due to his current lung cancer, history of PE and pulmonary fibrosis. He has been hospitalized recently for pneumonitis vs pneumonia. CT Chest shows this has resolved. He is immunocompromised and is at increased risk of pulmonary complications with surgery  as his baseline pulmonary status is compromised.  We will not clear as patient has increased oxygen ."   VS: BP 109/76   Pulse (!) 117   Temp 36.6 C (Oral)   Resp (!) 28   Ht 5\' 6"  (1.676 m)   Wt 85.1 kg   SpO2 93%   BMI 30.28 kg/m   PROVIDERS: [name], MD is PCP    LABS: Labs reviewed: Acceptable for surgery. (all labs ordered are listed, but only abnormal results are displayed)  Labs Reviewed  BASIC METABOLIC PANEL - Abnormal; Notable for the following components:      Result Value   Glucose, Bld 155 (*)    All other components within normal limits     IMAGES:   EKG:   CV: Echo 06/17/2019 1. Left ventricular ejection fraction, by estimation, is 60 to 65%. The  left ventricle has normal function. The left ventricle has no regional  wall motion abnormalities. Left ventricular diastolic parameters were  normal.   2. Right ventricular systolic function is normal. The right ventricular  size is normal.   3. The mitral valve is normal in structure. Trivial mitral valve  regurgitation. No  evidence of mitral stenosis.   4. The aortic valve is tricuspid. Aortic valve regurgitation is not  visualized. Mild aortic valve sclerosis is present, with no evidence of  aortic valve stenosis.   5. The inferior vena cava is normal in size with greater than 50%  respiratory variability, suggesting right atrial pressure of 3 mmHg.   Past Medical History:  Diagnosis Date   COPD (chronic obstructive pulmonary disease) (HCC)    Dyspnea    GERD (gastroesophageal reflux disease)    Headache    migraines   Hyperlipidemia    lung ca dx'd 12/2018   On home oxygen therapy    3 to 10 L   Pneumonia    Pulmonary embolus (HCC)    Tobacco abuse     Past Surgical History:  Procedure Laterality Date   COLONOSCOPY     CYSTOSCOPY W/ URETERAL STENT PLACEMENT Right 10/24/2021   Procedure: CYSTOSCOPY WITH RETROGRADE PYELOGRAM/URETERAL STENT PLACEMENT;  Surgeon: Janith Lima, MD;  Location: [facility];  Service: Urology;  Laterality: Right;   HAND SURGERY     VASECTOMY     VIDEO BRONCHOSCOPY WITH ENDOBRONCHIAL ULTRASOUND N/A 12/22/2018   Procedure: VIDEO BRONCHOSCOPY WITH ENDOBRONCHIAL ULTRASOUND;  Surgeon: Garner Nash, DO;  Location: [facility];  Service: Thoracic;  Laterality: N/A;   VIDEO BRONCHOSCOPY WITH ENDOBRONCHIAL ULTRASOUND N/A 01/09/2019   Procedure: VIDEO BRONCHOSCOPY WITH ENDOBRONCHIAL ULTRASOUND WITH FLUORO;  Surgeon: Valeta Harms,  Octavio Graves, DO;  Location: [facility];  Service: Thoracic;  Laterality: N/A;   VIDEO BRONCHOSCOPY WITH RADIAL ENDOBRONCHIAL ULTRASOUND N/A 12/22/2018   Procedure: RADIAL ENDOBRONCHIAL ULTRASOUND;  Surgeon: Garner Nash, DO;  Location: [facility];  Service: Thoracic;  Laterality: N/A;    MEDICATIONS:  albuterol (PROVENTIL) (2.5 MG/3ML) 0.083% nebulizer solution   albuterol (VENTOLIN HFA) 108 (90 Base) MCG/ACT inhaler   Budeson-Glycopyrrol-Formoterol (BREZTRI AEROSPHERE) 160-9-4.8 MCG/ACT AERO   dextromethorphan-guaiFENesin (MUCINEX DM) 30-600 MG 12hr tablet    fluconazole (DIFLUCAN) 591 MG tablet   folic acid (FOLVITE) 1 MG tablet   HYDROcodone-acetaminophen (NORCO/VICODIN) 5-325 MG tablet   ipratropium-albuterol (DUONEB) 0.5-2.5 (3) MG/3ML SOLN   midodrine (PROAMATINE) 5 MG tablet   Multiple Vitamins-Minerals (MULTI ADULT GUMMIES) CHEW   naphazoline-pheniramine (VISINE) 0.025-0.3 % ophthalmic solution   pantoprazole (PROTONIX) 40 MG tablet   predniSONE (DELTASONE) 10 MG tablet   tamsulosin (FLOMAX) 0.4 MG CAPS capsule   XARELTO 20 MG TABS tablet   No current facility-administered medications for this encounter.    Konrad Felix Ward, [name] [facility] [phone]

## 2022-03-27 ENCOUNTER — Ambulatory Visit (HOSPITAL_COMMUNITY)
Admission: RE | Admit: 2022-03-27 | Payer: No Typology Code available for payment source | Source: Ambulatory Visit | Admitting: Urology

## 2022-03-27 ENCOUNTER — Encounter (HOSPITAL_COMMUNITY): Admission: RE | Payer: Self-pay | Source: Ambulatory Visit

## 2022-03-27 SURGERY — CYSTOSCOPY, WITH RETROGRADE PYELOGRAM
Anesthesia: General | Laterality: Right

## 2022-03-28 ENCOUNTER — Encounter: Payer: Self-pay | Admitting: Acute Care

## 2022-03-28 MED ORDER — DOXYCYCLINE HYCLATE 100 MG PO TABS: 100.0000 mg | ORAL_TABLET | Freq: Two times a day (BID) | ORAL | 0 refills | Status: DC

## 2022-03-31 ENCOUNTER — Telehealth (HOSPITAL_COMMUNITY): Payer: Self-pay

## 2022-03-31 NOTE — Telephone Encounter (Signed)
Received referral from Dr. [name] for this pt to participate in Pulmonary Rehab with the diagnosis of COPD with bronchitis. Clinical review of pt follow up appt on 03/25/2022 Pulmonary office note. Pt appropriate for scheduling for Pulmonary rehab. Will forward to support staff for scheduling and verification of insurance eligibility/benefits with pt consent.   Janine Ores, RN, BSN Cardiac and Pulmonary Rehab

## 2022-04-02 ENCOUNTER — Inpatient Hospital Stay (HOSPITAL_BASED_OUTPATIENT_CLINIC_OR_DEPARTMENT_OTHER): Payer: No Typology Code available for payment source | Admitting: Nurse Practitioner

## 2022-04-02 ENCOUNTER — Inpatient Hospital Stay: Payer: No Typology Code available for payment source

## 2022-04-02 ENCOUNTER — Other Ambulatory Visit: Payer: Self-pay

## 2022-04-02 ENCOUNTER — Encounter: Payer: Self-pay | Admitting: Nurse Practitioner

## 2022-04-02 ENCOUNTER — Inpatient Hospital Stay (HOSPITAL_BASED_OUTPATIENT_CLINIC_OR_DEPARTMENT_OTHER): Payer: No Typology Code available for payment source | Admitting: Internal Medicine

## 2022-04-02 VITALS — BP 130/86 | HR 98 | Temp 98.1°F | Resp 18 | Wt 199.0 lb

## 2022-04-02 VITALS — BP 118/78 | HR 92 | Resp 18

## 2022-04-02 DIAGNOSIS — C3491 Malignant neoplasm of unspecified part of right bronchus or lung: Secondary | ICD-10-CM

## 2022-04-02 DIAGNOSIS — Z5111 Encounter for antineoplastic chemotherapy: Secondary | ICD-10-CM | POA: Diagnosis not present

## 2022-04-02 DIAGNOSIS — C3490 Malignant neoplasm of unspecified part of unspecified bronchus or lung: Secondary | ICD-10-CM

## 2022-04-02 DIAGNOSIS — Z515 Encounter for palliative care: Secondary | ICD-10-CM

## 2022-04-02 DIAGNOSIS — R530 Neoplastic (malignant) related fatigue: Secondary | ICD-10-CM | POA: Diagnosis not present

## 2022-04-02 DIAGNOSIS — G893 Neoplasm related pain (acute) (chronic): Secondary | ICD-10-CM | POA: Diagnosis not present

## 2022-04-02 LAB — CBC WITH DIFFERENTIAL (CANCER CENTER ONLY)
Abs Immature Granulocytes: 0.38 10*3/uL — ABNORMAL HIGH (ref 0.00–0.07)
Basophils Absolute: 0.1 10*3/uL (ref 0.0–0.1)
Basophils Relative: 1 %
Eosinophils Absolute: 0 10*3/uL (ref 0.0–0.5)
Eosinophils Relative: 0 %
HCT: 38.5 % — ABNORMAL LOW (ref 39.0–52.0)
Hemoglobin: 12.7 g/dL — ABNORMAL LOW (ref 13.0–17.0)
Immature Granulocytes: 4 %
Lymphocytes Relative: 6 %
Lymphs Abs: 0.5 10*3/uL — ABNORMAL LOW (ref 0.7–4.0)
MCH: 31.6 pg (ref 26.0–34.0)
MCHC: 33 g/dL (ref 30.0–36.0)
MCV: 95.8 fL (ref 80.0–100.0)
Monocytes Absolute: 0.5 10*3/uL (ref 0.1–1.0)
Monocytes Relative: 5 %
Neutro Abs: 7.4 10*3/uL (ref 1.7–7.7)
Neutrophils Relative %: 84 %
Platelet Count: 374 10*3/uL (ref 150–400)
RBC: 4.02 MIL/uL — ABNORMAL LOW (ref 4.22–5.81)
RDW: 16.7 % — ABNORMAL HIGH (ref 11.5–15.5)
WBC Count: 8.9 10*3/uL (ref 4.0–10.5)
nRBC: 0 % (ref 0.0–0.2)

## 2022-04-02 LAB — CMP (CANCER CENTER ONLY)
ALT: 20 U/L (ref 0–44)
AST: 19 U/L (ref 15–41)
Albumin: 3.4 g/dL — ABNORMAL LOW (ref 3.5–5.0)
Alkaline Phosphatase: 89 U/L (ref 38–126)
Anion gap: 7 (ref 5–15)
BUN: 20 mg/dL (ref 8–23)
CO2: 30 mmol/L (ref 22–32)
Calcium: 8.8 mg/dL — ABNORMAL LOW (ref 8.9–10.3)
Chloride: 101 mmol/L (ref 98–111)
Creatinine: 0.94 mg/dL (ref 0.61–1.24)
GFR, Estimated: >60 mL/min (ref >60)
Glucose, Bld: 170 mg/dL — ABNORMAL HIGH (ref 70–99)
Potassium: 4.7 mmol/L (ref 3.5–5.1)
Sodium: 138 mmol/L (ref 135–145)
Total Bilirubin: 0.3 mg/dL (ref 0.3–1.2)
Total Protein: 6.3 g/dL — ABNORMAL LOW (ref 6.5–8.1)

## 2022-04-02 LAB — TSH: TSH: 2.137 u[IU]/mL (ref 0.350–4.500)

## 2022-04-02 MED ORDER — CYANOCOBALAMIN 1000 MCG/ML IJ SOLN
1000.0000 ug | Freq: Once | INTRAMUSCULAR | Status: AC
  Administered 2022-04-02: 1000 ug via INTRAMUSCULAR
  Filled 2022-04-02: qty 1

## 2022-04-02 MED ORDER — PROCHLORPERAZINE MALEATE 10 MG PO TABS
10.0000 mg | ORAL_TABLET | Freq: Once | ORAL | Status: AC
  Administered 2022-04-02: 10 mg via ORAL
  Filled 2022-04-02: qty 1

## 2022-04-02 MED ORDER — SODIUM CHLORIDE 0.9 % IV SOLN
500.0000 mg/m2 | Freq: Once | INTRAVENOUS | Status: AC
  Administered 2022-04-02: 1000 mg via INTRAVENOUS
  Filled 2022-04-02: qty 40

## 2022-04-02 MED ORDER — SODIUM CHLORIDE 0.9 % IV SOLN: Freq: Once | INTRAVENOUS | Status: AC

## 2022-04-02 NOTE — Progress Notes (Signed)
[place]  [phone] Fax:([phone]) [phone]   Name: Philip [name] Date: 04/02/2022 MRN: [id number]  DOB: 09/21/1955  Patient Care Team: [name], MD as PCP - General (Family Medicine) Valrie Hart, RN as Oncology Nurse Navigator Leanna Sato, RN as Case Manager (General Practice) Pickenpack-Cousar, Carlena Sax, NP as Nurse Practitioner (Nurse Practitioner)   INTERVAL HISTORY: Philip [name] is a 67 y.o. male with oncologic medical history including recurrent non-small cell lung cancer (01/2019 then again 12/2020). Past medical history also includes COPD, BPH, tobacco use, PE, and GERD. Palliative ask to see for symptom management and goals of care.   SOCIAL HISTORY:     reports that he quit smoking about 3 months ago. His smoking use included cigarettes. He started smoking about 51 years ago. He has a 90.00 pack-year smoking history. He has quit using smokeless tobacco.  His smokeless tobacco use included chew. He reports current alcohol use. He reports that he does not currently use drugs after having used the following drugs: Marijuana.  ADVANCE DIRECTIVES:  None on file   CODE STATUS: Full code  PAST MEDICAL HISTORY: Past Medical History:  Diagnosis Date   COPD (chronic obstructive pulmonary disease) ([place])    Dyspnea    GERD (gastroesophageal reflux disease)    Headache    migraines   Hyperlipidemia    lung ca dx'd 12/2018   On home oxygen therapy    3 to 10 L   Pneumonia    Pulmonary embolus (HCC)    Tobacco abuse     ALLERGIES:  is allergic to carboplatin, klonopin [clonazepam], and norco [hydrocodone-acetaminophen].  MEDICATIONS:  Current Outpatient Medications  Medication Sig Dispense Refill   albuterol (PROVENTIL) (2.5 MG/3ML) 0.083% nebulizer solution Take 3 mLs (2.5 mg total) by nebulization every 4 (four) hours as needed for wheezing or shortness of breath. 75 mL 2   albuterol (VENTOLIN HFA) 108  (90 Base) MCG/ACT inhaler Inhale 2 puffs into the lungs every 4 (four) hours as needed for wheezing or shortness of breath. 18 g 11   Budeson-Glycopyrrol-Formoterol (BREZTRI AEROSPHERE) 160-9-4.8 MCG/ACT AERO Inhale 2 puffs into the lungs in the morning and at bedtime. 1 each 3   dextromethorphan-guaiFENesin (MUCINEX DM) 30-600 MG 12hr tablet Take 1 tablet by mouth 2 (two) times daily as needed for cough. 40 tablet 0   doxycycline (VIBRA-TABS) 100 MG tablet Take 1 tablet (100 mg total) by mouth 2 (two) times daily. 14 tablet 0   fluconazole (DIFLUCAN) 100 MG tablet Take 100 mg by mouth daily.     folic acid (FOLVITE) 1 MG tablet TAKE 1 TABLET BY MOUTH EVERY DAY 90 tablet 1   HYDROcodone-acetaminophen (NORCO/VICODIN) 5-325 MG tablet Take 1 tablet by mouth every 12 (twelve) hours as needed for moderate pain. 45 tablet 0   ipratropium-albuterol (DUONEB) 0.5-2.5 (3) MG/3ML SOLN Take 3 mLs by nebulization every 4 (four) hours as needed. 360 mL 1   midodrine (PROAMATINE) 5 MG tablet Take 1 tablet (5 mg total) by mouth 3 (three) times daily with meals. 90 tablet 3   Multiple Vitamins-Minerals (MULTI ADULT GUMMIES) CHEW Chew 1 tablet by mouth in the morning and at bedtime.     naphazoline-pheniramine (VISINE) 0.025-0.3 % ophthalmic solution Place 1 drop into both eyes daily as needed for eye irritation.     pantoprazole (PROTONIX) 40 MG tablet Take 1 tablet (40 mg total) by mouth daily. 30 tablet 1   predniSONE (DELTASONE)  10 MG tablet Prednisone taper; 10 mg tablets: 4 tabs x 2 days, 3 tabs x 2 days, 2 tabs x 2 days 1 tab ( 10 mg ) thereafter. 28 tablet 0   tamsulosin (FLOMAX) 0.4 MG CAPS capsule Take 0.4 mg by mouth at bedtime.     XARELTO 20 MG TABS tablet TAKE 1 TABLET BY MOUTH DAILY WITH SUPPER. 30 tablet 11   No current facility-administered medications for this visit.    VITAL SIGNS: There were no vitals taken for this visit. There were no vitals filed for this visit.  Estimated body mass index  is 31.44 kg/m as calculated from the following:   Height as of 03/25/22: '5\' 6"'$  (1.676 m).   Weight as of 03/25/22: 194 lb 12.8 oz (88.4 kg).   PERFORMANCE STATUS (ECOG) : 1 - Symptomatic but completely ambulatory   Physical Exam General: NAD, in wheelchair  Cardiovascular: regular rate and rhythm Pulmonary: normal breathing pattern, oxygen in place  Abdomen: soft, nontender, + bowel sounds Extremities: no edema, no joint deformities Skin: no rashes Neurological: AAO x4  IMPRESSION: Mr. Philip [name] presents to clinic today for follow-up. No acute distress. Wife is present. He is sitting comfortably in wheelchair. Overall feeling well.   Neoplasm related pain Philip [name] reports his pain is better controlled. He has been able to sleep throughout the night. Is as active as much as possible.    He is taking Hydrocodone 5/'325mg'$  as needed. Does not require around the clock. Averages once daily depending on level of pain.   Education provided on use of stool softeners if he is not having daily bowel movements. Aware if more than 2 days would need to make part of his daily regimen.   PLAN:  Hydrocodone 5/325 mg one tablet every 12 hours as needed for moderate to severe pain.  Pain contract completed on 2/7 Bowel regimen  Smoking cessation Ongoing goals of care and symptom management support.  I will plan to see patient back in 2-4 weeks in collaboration to other oncology appointments.    Patient expressed understanding and was in agreement with this plan. He also understands that He can call the clinic at any time with any questions, concerns, or complaints.     Any controlled substances utilized were prescribed in the context of palliative care. PDMP has been reviewed.    Time Total: 20 min   Visit consisted of counseling and education dealing with the complex and emotionally intense issues of symptom management and palliative care in the setting of serious and potentially life-threatening  illness.Greater than 50%  of this time was spent counseling and coordinating care related to the above assessment and plan.  Alda Lea, AGPCNP-BC  Palliative Medicine Team/[facility]

## 2022-04-02 NOTE — Progress Notes (Signed)
[facility] [phone]   [phone]  OFFICE PROGRESS NOTE  [name], MD [address] [address]  DIAGNOSIS:  1) recurrent non-small cell lung cancer initially diagnosed as stage IIIc (T4, N3, M0) non-small cell lung cancer, adenocarcinoma presented with large right hilar mass in addition to bilateral hilar and mediastinal lymphadenopathy diagnosed in December 2020.  The patient has evidence for disease recurrence in October 2022. 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  Started on Xarelto on March 17, 2020 and currently 20 mg p.o. daily.   PRIOR THERAPY:  1) Weekly concurrent chemoradiation with carboplatin for an AUC of 2 and paclitaxel 45 mg/m2. First dose starting 01/30/2019.   Status post 6 cycles. 2) Consolidation immunotherapy with Imfinzi 1500 mg IV every 4 weeks. First dose expected on 04/19/2019.  Status post 13 cycles. 3) disease recurrence in October 2022.   CURRENT THERAPY: Systemic chemotherapy with carboplatin for AUC of 5, Alimta 500 Mg/M2 and Keytruda 200 Mg IV every 3 weeks.  First dose January 01, 2021.  Status post 20 cycles.  He had hypersensitivity reaction to carboplatin and this was discontinued after cycle #2.  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  INTERVAL HISTORY: Philip [name] 67 y.o. male returns to clinic today for follow-up visit.  The patient is feeling fine today with no concerning complaints except for the baseline shortness of breath and he is currently on home oxygen.  He was seen by pulmonary medicine recently and he started treatment with doxycycline in addition to his previous tapered dose of prednisone.  His oxygen requirement is less.  He denied having any chest pain but has cough with no hemoptysis.  He has no  nausea, vomiting, diarrhea or constipation.  He has no headache or visual changes.  He has no recent weight loss or night sweats.  He is here for evaluation before starting cycle #21.   MEDICAL HISTORY: Past Medical History:  Diagnosis Date   COPD (chronic obstructive pulmonary disease) (HCC)    Dyspnea    GERD (gastroesophageal reflux disease)    Headache    migraines   Hyperlipidemia    lung ca dx'd 12/2018   On home oxygen therapy    3 to 10 L   Pneumonia    Pulmonary embolus (HCC)    Tobacco abuse     ALLERGIES:  is allergic to carboplatin, klonopin [clonazepam], and norco [hydrocodone-acetaminophen].  MEDICATIONS:  Current Outpatient Medications  Medication Sig Dispense Refill   albuterol (PROVENTIL) (2.5 MG/3ML) 0.083% nebulizer solution Take 3 mLs (2.5 mg total) by nebulization every 4 (four) hours as needed for wheezing or shortness of breath. 75 mL 2   albuterol (VENTOLIN HFA) 108 (90 Base) MCG/ACT inhaler Inhale 2 puffs into the lungs every 4 (four) hours as needed for wheezing or shortness of breath. 18 g 11   Budeson-Glycopyrrol-Formoterol (BREZTRI AEROSPHERE) 160-9-4.8 MCG/ACT AERO Inhale 2 puffs into the lungs in the morning and at bedtime. 1 each 3   dextromethorphan-guaiFENesin (MUCINEX DM) 30-600 MG 12hr tablet Take 1 tablet by mouth 2 (two) times daily as needed for cough. 40 tablet 0   doxycycline (VIBRA-TABS) 100 MG tablet Take 1 tablet (100 mg total) by mouth 2 (two) times daily. 14 tablet 0   fluconazole (DIFLUCAN) 100 MG tablet  Take 100 mg by mouth daily.     folic acid (FOLVITE) 1 MG tablet TAKE 1 TABLET BY MOUTH EVERY DAY 90 tablet 1   HYDROcodone-acetaminophen (NORCO/VICODIN) 5-325 MG tablet Take 1 tablet by mouth every 12 (twelve) hours as needed for moderate pain. 45 tablet 0   ipratropium-albuterol (DUONEB) 0.5-2.5 (3) MG/3ML SOLN Take 3 mLs by nebulization every 4 (four) hours as needed. 360 mL 1   midodrine (PROAMATINE) 5 MG tablet Take 1 tablet (5 mg  total) by mouth 3 (three) times daily with meals. 90 tablet 3   Multiple Vitamins-Minerals (MULTI ADULT GUMMIES) CHEW Chew 1 tablet by mouth in the morning and at bedtime.     naphazoline-pheniramine (VISINE) 0.025-0.3 % ophthalmic solution Place 1 drop into both eyes daily as needed for eye irritation.     pantoprazole (PROTONIX) 40 MG tablet Take 1 tablet (40 mg total) by mouth daily. 30 tablet 1   predniSONE (DELTASONE) 10 MG tablet Prednisone taper; 10 mg tablets: 4 tabs x 2 days, 3 tabs x 2 days, 2 tabs x 2 days 1 tab ( 10 mg ) thereafter. 28 tablet 0   tamsulosin (FLOMAX) 0.4 MG CAPS capsule Take 0.4 mg by mouth at bedtime.     XARELTO 20 MG TABS tablet TAKE 1 TABLET BY MOUTH DAILY WITH SUPPER. 30 tablet 11   No current facility-administered medications for this visit.    SURGICAL HISTORY:  Past Surgical History:  Procedure Laterality Date   COLONOSCOPY     CYSTOSCOPY W/ URETERAL STENT PLACEMENT Right 10/24/2021   Procedure: CYSTOSCOPY WITH RETROGRADE PYELOGRAM/URETERAL STENT PLACEMENT;  Surgeon: Janith Lima, MD;  Location: [facility];  Service: Urology;  Laterality: Right;   HAND SURGERY     VASECTOMY     VIDEO BRONCHOSCOPY WITH ENDOBRONCHIAL ULTRASOUND N/A 12/22/2018   Procedure: VIDEO BRONCHOSCOPY WITH ENDOBRONCHIAL ULTRASOUND;  Surgeon: Garner Nash, DO;  Location: [facility];  Service: Thoracic;  Laterality: N/A;   VIDEO BRONCHOSCOPY WITH ENDOBRONCHIAL ULTRASOUND N/A 01/09/2019   Procedure: VIDEO BRONCHOSCOPY WITH ENDOBRONCHIAL ULTRASOUND WITH FLUORO;  Surgeon: Garner Nash, DO;  Location: [facility];  Service: Thoracic;  Laterality: N/A;   VIDEO BRONCHOSCOPY WITH RADIAL ENDOBRONCHIAL ULTRASOUND N/A 12/22/2018   Procedure: RADIAL ENDOBRONCHIAL ULTRASOUND;  Surgeon: Garner Nash, DO;  Location: [facility];  Service: Thoracic;  Laterality: N/A;    REVIEW OF SYSTEMS:  A comprehensive review of systems was negative except for: Constitutional: positive for fatigue Respiratory: positive  for cough and dyspnea on exertion   PHYSICAL EXAMINATION: General appearance: alert, cooperative, fatigued, and no distress Head: Normocephalic, without obvious abnormality, atraumatic Neck: no adenopathy, no JVD, supple, symmetrical, trachea midline, and thyroid not enlarged, symmetric, no tenderness/mass/nodules Lymph nodes: Cervical, supraclavicular, and axillary nodes normal. Resp: rales bilaterally and wheezes bilaterally Back: symmetric, no curvature. ROM normal. No CVA tenderness. Cardio: regular rate and rhythm, S1, S2 normal, no murmur, click, rub or gallop GI: soft, non-tender; bowel sounds normal; no masses,  no organomegaly Extremities: extremities normal, atraumatic, no cyanosis or edema  ECOG PERFORMANCE STATUS: 1 - Symptomatic but completely ambulatory  Blood pressure 130/86, pulse 98, temperature 98.1 F (36.7 C), temperature source Oral, resp. rate 18, weight 199 lb (90.3 kg), SpO2 93 %.  LABORATORY DATA: Lab Results  Component Value Date   WBC 8.9 04/02/2022   HGB 12.7 (L) 04/02/2022   HCT 38.5 (L) 04/02/2022   MCV 95.8 04/02/2022   PLT 374 04/02/2022      Chemistry  Component Value Date/Time   NA 138 04/02/2022 1013   K 4.7 04/02/2022 1013   CL 101 04/02/2022 1013   CO2 30 04/02/2022 1013   BUN 20 04/02/2022 1013   CREATININE 0.94 04/02/2022 1013      Component Value Date/Time   CALCIUM 8.8 (L) 04/02/2022 1013   ALKPHOS 89 04/02/2022 1013   AST 19 04/02/2022 1013   ALT 20 04/02/2022 1013   BILITOT 0.3 04/02/2022 1013       RADIOGRAPHIC STUDIES: DG Chest 2 View  Result Date: 03/25/2022 CLINICAL DATA:  COPD, increased oxygen demand EXAM: CHEST - 2 VIEW COMPARISON:  01/19/2022, 03/11/2022 FINDINGS: Frontal and lateral views of the chest demonstrate patchy bilateral airspace disease superimposed upon background emphysema, scarring, and bronchiectasis. Chronic elevation of the right hemidiaphragm. No effusion or pneumothorax. No acute bony  abnormalities. IMPRESSION: 1. Patchy bilateral airspace disease superimposed upon background scarring and emphysema. Favor multifocal infection over edema. Electronically Signed   By: Randa Ngo M.D.   On: 03/25/2022 15:22   CT CHEST WO CONTRAST  Result Date: 03/12/2022 CLINICAL DATA:  Pneumonia.  Lung cancer.  * Tracking Code: BO * EXAM: CT CHEST WITHOUT CONTRAST TECHNIQUE: Multidetector CT imaging of the chest was performed following the standard protocol without IV contrast. RADIATION DOSE REDUCTION: This exam was performed according to the departmental dose-optimization program which includes automated exposure control, adjustment of the mA and/or kV according to patient size and/or use of iterative reconstruction technique. COMPARISON:  02/05/2022. FINDINGS: Cardiovascular: Atherosclerotic calcification of the aorta and coronary arteries. Enlarged pulmonic trunk. Heart is at the upper limits of normal in size to mildly enlarged. No pericardial effusion. Mediastinum/Nodes: Mediastinal lymph nodes measure up to 10 mm in the prevascular space (2/37), unchanged. Hilar regions are difficult to evaluate without IV contrast. No axillary adenopathy. Esophagus is grossly unremarkable. Lungs/Pleura: Centrilobular and paraseptal emphysema. Post treatment pulmonary retraction and bronchiectasis in the medial aspects of both hemithoraces. Mild basilar predominant subpleural reticulation and ground-glass bilaterally. Interval clearing of previously seen patchy pulmonary parenchymal ground-glass. Trace right pleural fluid. Airway is grossly unremarkable. Upper Abdomen: Visualized portions of the liver and gallbladder are unremarkable. Low-attenuation thickening of the right adrenal gland. 2.5 cm left adrenal nodule measures -8 Hounsfield units. No specific follow-up necessary. Visualized portions of the kidneys, spleen, pancreas, stomach and bowel are otherwise unremarkable. No upper abdominal adenopathy.  Musculoskeletal: Degenerative changes in the spine. No worrisome lytic or sclerotic lesions. IMPRESSION: 1. Interval clearing of previously seen patchy bilateral ground-glass. 2. Post treatment scarring in the medial aspects of both hemithoraces. No evidence of recurrent or metastatic disease. 3. 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 If further evaluation is desired, high resolution chest CT without contrast could be performed. 4. Trace right pleural fluid. 5. Left adrenal adenoma. 6. Aortic atherosclerosis (ICD10-I70.0). Coronary artery calcification. 7. Enlarged pulmonic trunk, indicative of pulmonary arterial hypertension. Electronically Signed   By: Lorin Picket M.D.   On: 03/12/2022 11:25     ASSESSMENT AND PLAN: This is a very pleasant 67 years old white male with metastatic non-small cell lung cancer that was initially diagnosed as stage IIIc non-small cell lung cancer, adenocarcinoma diagnosed in December 2020 The patient completed a course of concurrent chemoradiation with weekly carboplatin and paclitaxel status post 6 cycles and he tolerated his treatment well and has partial response. He underwent consolidation treatment with immunotherapy with Imfinzi 1500 mg IV every 4 weeks  status post 13 cycles. He was on observation and feeling fine with no concerning complaints except for mild cough as well as the left shoulder pain. His scan showed increase in the size of the soft tissue nodule in the right middle lobe adjacent to the postradiation changes highly suspicious for recurrent disease.  There was also soft tissue of the right hilum/as ago esophageal recess unchanged at but increased compared to remote prior studies. The patient had a PET scan that showed hypermetabolic nodule in the right lower lobe adjacent to the post radiation treatment site increased in size and  consistent with lung cancer recurrence.  There was second hypermetabolic nodule in the right lower lobe above the diaphragm concerning for recurrence and hypermetabolic contralateral lymph node in the high left paratracheal mediastinum also concerning for metastatic adenopathy recurrence. He started systemic chemotherapy with carboplatin for AUC of 5, Alimta 500 Mg/M2 and Keytruda 200 Mg IV every 3 weeks on January 01, 2021.   Carboplatin was discontinued with cycle #2 secondary to hypersensitivity reaction.  The patient is status post 20 cycles.  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  The patient is currently on single agent treatment with Alimta. The patient has been tolerating this treatment fairly well with no concerning adverse effects. I recommended for him to proceed with cycle #21 today as planned. I will see him back for follow-up visit in 3 weeks for evaluation before the next cycle of his treatment. For the history of COPD, immunotherapy mediated pneumonitis and suspicious inflammatory process, he is currently on a tapered dose of prednisone as well as doxycycline. For the hydronephrosis, he is followed by urology and had a ureteral stent placed. The patient was advised to call immediately if he has any other concerning symptoms in the interval.  The patient voices understanding of current disease status and treatment options and is in agreement with the current care plan. All questions were answered. The patient knows to call the clinic with any problems, questions or concerns. We can certainly see the patient much sooner if necessary. The total time spent in the appointment was 20 minutes.  Disclaimer: This note was dictated with voice recognition software. Similar sounding words can inadvertently be transcribed and may not be corrected upon review.

## 2022-04-02 NOTE — Progress Notes (Signed)
Patient seen by MD today  Vitals are within treatment parameters.  Labs reviewed: and are within treatment parameters.  Per physician team, patient is ready for treatment and there are NO modifications to the treatment plan.

## 2022-04-02 NOTE — Patient Instructions (Signed)
[facility]  Discharge Instructions: Thank you for choosing [facility] to provide your oncology and hematology care.   If you have a lab appointment with the [facility], please go directly to the [facility] and check in at the registration area.   Wear comfortable clothing and clothing appropriate for easy access to any Portacath or PICC line.   We strive to give you quality time with your provider. You may need to reschedule your appointment if you arrive late (15 or more minutes).  Arriving late affects you and other patients whose appointments are after yours.  Also, if you miss three or more appointments without notifying the office, you may be dismissed from the clinic at the provider's discretion.      For prescription refill requests, have your pharmacy contact our office and allow 72 hours for refills to be completed.    Today you received the following chemotherapy and/or immunotherapy agents: Alimta      To help prevent nausea and vomiting after your treatment, we encourage you to take your nausea medication as directed.  BELOW ARE SYMPTOMS THAT SHOULD BE REPORTED IMMEDIATELY: *FEVER GREATER THAN 100.4 F (38 C) OR HIGHER *CHILLS OR SWEATING *NAUSEA AND VOMITING THAT IS NOT CONTROLLED WITH YOUR NAUSEA MEDICATION *UNUSUAL SHORTNESS OF BREATH *UNUSUAL BRUISING OR BLEEDING *URINARY PROBLEMS (pain or burning when urinating, or frequent urination) *BOWEL PROBLEMS (unusual diarrhea, constipation, pain near the anus) TENDERNESS IN MOUTH AND THROAT WITH OR WITHOUT PRESENCE OF ULCERS (sore throat, sores in mouth, or a toothache) UNUSUAL RASH, SWELLING OR PAIN  UNUSUAL VAGINAL DISCHARGE OR ITCHING   Items with * indicate a potential emergency and should be followed up as soon as possible or go to the Emergency Department if any problems should occur.  Please show the CHEMOTHERAPY ALERT CARD or IMMUNOTHERAPY ALERT CARD at check-in  to the Emergency Department and triage nurse.  Should you have questions after your visit or need to cancel or reschedule your appointment, please contact [facility]  Dept: [phone]  and follow the prompts.  Office hours are 8:00 a.m. to 4:30 p.m. Monday - Friday. Please note that voicemails left after 4:00 p.m. may not be returned until the following business day.  We are closed weekends and major holidays. You have access to a nurse at all times for urgent questions. Please call the main number to the clinic Dept: [phone] and follow the prompts.   For any non-urgent questions, you may also contact your provider using MyChart. We now offer e-Visits for anyone 56 and older to request care online for non-urgent symptoms. For details visit [email or web address].   Also download the MyChart app! Go to the app store, search "MyChart", open the app, select [facility], and log in with your MyChart username and password.

## 2022-04-04 LAB — T4: T4, Total: 8.2 ug/dL (ref 4.5–12.0)

## 2022-04-06 ENCOUNTER — Other Ambulatory Visit: Payer: Self-pay | Admitting: Pulmonary Disease

## 2022-04-06 DIAGNOSIS — J449 Chronic obstructive pulmonary disease, unspecified: Secondary | ICD-10-CM

## 2022-04-08 ENCOUNTER — Ambulatory Visit (INDEPENDENT_AMBULATORY_CARE_PROVIDER_SITE_OTHER): Payer: No Typology Code available for payment source | Admitting: Pulmonary Disease

## 2022-04-08 ENCOUNTER — Encounter: Payer: Self-pay | Admitting: Pulmonary Disease

## 2022-04-08 VITALS — BP 138/72 | HR 128 | Wt 193.8 lb

## 2022-04-08 DIAGNOSIS — J209 Acute bronchitis, unspecified: Secondary | ICD-10-CM

## 2022-04-08 DIAGNOSIS — C3491 Malignant neoplasm of unspecified part of right bronchus or lung: Secondary | ICD-10-CM

## 2022-04-08 DIAGNOSIS — J440 Chronic obstructive pulmonary disease with acute lower respiratory infection: Secondary | ICD-10-CM

## 2022-04-08 DIAGNOSIS — J9601 Acute respiratory failure with hypoxia: Secondary | ICD-10-CM

## 2022-04-08 MED ORDER — PREDNISONE 10 MG PO TABS: ORAL_TABLET | ORAL | 0 refills | Status: DC

## 2022-04-08 NOTE — Patient Instructions (Addendum)
Nice to see you again  Take prednisone 40 mg for 5 days then 20 mg for 5 days then stay on 10 mg  a day every day after that  No other changes  Return to clinic 6 weeks or sooner a needed

## 2022-04-09 ENCOUNTER — Encounter: Payer: Self-pay | Admitting: Internal Medicine

## 2022-04-09 ENCOUNTER — Encounter: Payer: Self-pay | Admitting: Physician Assistant

## 2022-04-10 NOTE — Progress Notes (Signed)
Synopsis: Referred in January 2020 for pulmonary MAC by [name], MD.  Previously seen by Dr. Adair Laundry.  Previously seen by Dr. Valeta Harms.  Subjective:   PATIENT ID: Philip [name] GENDER: male DOB: [birth year], MRN: [id number]  Chief Complaint  Patient presents with   Follow-up    Follow up from last week with SG. Pt states he feels like he has his infection again. He is on 8L with exertion. He states when he was on antioibics he was feeling better but now he is struggling to catch his breath. Pt is on Breztri and he states he does go though moments where he uses it and does not use it. He states he did not see a difference when he was using it.     Philip [name] is a 66 y.o. gentleman with a history of stage III lung adenocarcinoma, COPD With worsened symptoms recently over last several weeks now severe hypoxemia on up to 8 L nasal cannula.    Hospitalized 01/2022 with pneumonia.  Since then has had a hard time.  Discharged on oxygen.  With worsening over the last 4 weeks.  Seen by Eric Form, NP twice.  Notes reviewed.  Treated for exacerbations.  Now using up to 8 L nasal cannula.  In the workup for this over the last couple of months a CTA PE protocol was performed that showed no PE but shows significant postradiation fibrosis changes emphysema etc.        Past Medical History:  Diagnosis Date   COPD (chronic obstructive pulmonary disease) (HCC)    Dyspnea    GERD (gastroesophageal reflux disease)    Headache    migraines   Hyperlipidemia    lung ca dx'd 12/2018   On home oxygen therapy    3 to 10 L   Pneumonia    Pulmonary embolus (HCC)    Tobacco abuse      Family History  Problem Relation Age of Onset   Cancer Father    Stomach cancer Father      Past Surgical History:  Procedure Laterality Date   COLONOSCOPY     CYSTOSCOPY W/ URETERAL STENT PLACEMENT Right 10/24/2021   Procedure: CYSTOSCOPY WITH RETROGRADE PYELOGRAM/URETERAL STENT PLACEMENT;  Surgeon: Janith Lima, MD;  Location: [facility];  Service: Urology;  Laterality: Right;   HAND SURGERY     VASECTOMY     VIDEO BRONCHOSCOPY WITH ENDOBRONCHIAL ULTRASOUND N/A 12/22/2018   Procedure: VIDEO BRONCHOSCOPY WITH ENDOBRONCHIAL ULTRASOUND;  Surgeon: Garner Nash, DO;  Location: [place];  Service: Thoracic;  Laterality: N/A;   VIDEO BRONCHOSCOPY WITH ENDOBRONCHIAL ULTRASOUND N/A 01/09/2019   Procedure: VIDEO BRONCHOSCOPY WITH ENDOBRONCHIAL ULTRASOUND WITH FLUORO;  Surgeon: Garner Nash, DO;  Location: [place];  Service: Thoracic;  Laterality: N/A;   VIDEO BRONCHOSCOPY WITH RADIAL ENDOBRONCHIAL ULTRASOUND N/A 12/22/2018   Procedure: RADIAL ENDOBRONCHIAL ULTRASOUND;  Surgeon: Garner Nash, DO;  Location: [facility];  Service: Thoracic;  Laterality: N/A;    Social History   Socioeconomic History   Marital status: Married    Spouse name: Not on file   Number of children: Not on file   Years of education: Not on file   Highest education level: Not on file  Occupational History   Not on file  Tobacco Use   Smoking status: Former    Packs/day: 2.00    Years: 45.00    Total pack years: 90.00    Types: Cigarettes    Start date:  07/04/1970    Quit date: 12/2021    Years since quitting: 0.3   Smokeless tobacco: Former    Types: Chew   Tobacco comments:    Smoking 2 packs of cigarettes a week. 10/14/2021 [place]    Smoking is on and off, will go a week without smoking then start up and smoke half a pack a day  Vaping Use   Vaping Use: Never used  Substance and Sexual Activity   Alcohol use: Yes    Comment: hasn't drank in one month   Drug use: Not Currently    Types: Marijuana   Sexual activity: Not on file  Other Topics Concern   Not on file  Social History Narrative   Not on file   Social Determinants of Health   Financial Resource Strain: Not on file  Food Insecurity: No Food Insecurity (01/12/2022)   Hunger Vital Sign    Worried About Running Out of Food in the Last Year: Never true     Ran Out of Food in the Last Year: Never true  Transportation Needs: No Transportation Needs (01/12/2022)   PRAPARE - Hydrologist (Medical): No    Lack of Transportation (Non-Medical): No  Physical Activity: Not on file  Stress: Not on file  Social Connections: Not on file  Intimate Partner Violence: Not At Risk (01/12/2022)   Humiliation, Afraid, Rape, and Kick questionnaire    Fear of Current or Ex-Partner: No    Emotionally Abused: No    Physically Abused: No    Sexually Abused: No     Allergies  Allergen Reactions   Carboplatin Anaphylaxis, Shortness Of Breath, Cough and Hypertension    On dose 8. Became short of breath. Symptom management was called. Received benadryl, solumedrol, Pepcid and fluids. Medication discontinued.    Klonopin [Clonazepam] Other (See Comments)    nervous   Norco [Hydrocodone-Acetaminophen] Other (See Comments)    Made pt feel jittery      Immunization History  Administered Date(s) Administered   Influenza Split 11/20/2008   Influenza,inj,Quad PF,6+ Mos 10/24/2018, 11/08/2018, 01/15/2020   Influenza,inj,quad, With Preservative 03/15/2015   PFIZER(Purple Top)SARS-COV-2 Vaccination 04/27/2019, 05/11/2019, 12/07/2019, 12/26/2019   PNEUMOCOCCAL CONJUGATE-20 05/12/2021   Pneumococcal Polysaccharide-23 12/12/2007, 10/10/2019   Td 05/18/2014   Tdap 11/16/2006, 05/18/2014    Outpatient Medications Prior to Visit  Medication Sig Dispense Refill   albuterol (PROVENTIL) (2.5 MG/3ML) 0.083% nebulizer solution Take 3 mLs (2.5 mg total) by nebulization every 4 (four) hours as needed for wheezing or shortness of breath. 75 mL 2   albuterol (VENTOLIN HFA) 108 (90 Base) MCG/ACT inhaler Inhale 2 puffs into the lungs every 4 (four) hours as needed for wheezing or shortness of breath. 18 g 11   Budeson-Glycopyrrol-Formoterol (BREZTRI AEROSPHERE) 160-9-4.8 MCG/ACT AERO Inhale 2 puffs into the lungs in the morning and at bedtime. 1  each 3   dextromethorphan-guaiFENesin (MUCINEX DM) 30-600 MG 12hr tablet Take 1 tablet by mouth 2 (two) times daily as needed for cough. 40 tablet 0   fluconazole (DIFLUCAN) 100 MG tablet Take 100 mg by mouth daily.     folic acid (FOLVITE) 1 MG tablet TAKE 1 TABLET BY MOUTH EVERY DAY 90 tablet 1   HYDROcodone-acetaminophen (NORCO/VICODIN) 5-325 MG tablet Take 1 tablet by mouth every 12 (twelve) hours as needed for moderate pain. 45 tablet 0   midodrine (PROAMATINE) 5 MG tablet Take 1 tablet (5 mg total) by mouth 3 (three) times daily with meals. [place]  tablet 3   Multiple Vitamins-Minerals (MULTI ADULT GUMMIES) CHEW Chew 1 tablet by mouth in the morning and at bedtime.     naphazoline-pheniramine (VISINE) 0.025-0.3 % ophthalmic solution Place 1 drop into both eyes daily as needed for eye irritation.     pantoprazole (PROTONIX) 40 MG tablet Take 1 tablet (40 mg total) by mouth daily. 30 tablet 1   tamsulosin (FLOMAX) 0.4 MG CAPS capsule Take 0.4 mg by mouth at bedtime.     XARELTO 20 MG TABS tablet TAKE 1 TABLET BY MOUTH DAILY WITH SUPPER. 30 tablet 11   predniSONE (DELTASONE) 10 MG tablet Prednisone taper; 10 mg tablets: 4 tabs x 2 days, 3 tabs x 2 days, 2 tabs x 2 days 1 tab ( 10 mg ) thereafter. 28 tablet 0   doxycycline (VIBRA-TABS) 100 MG tablet Take 1 tablet (100 mg total) by mouth 2 (two) times daily. 14 tablet 0   No facility-administered medications prior to visit.   Review of systems: N/AA   Objective:   Vitals:   04/08/22 1405  BP: 138/72  Pulse: (!) 128  SpO2: 92%  Weight: 193 lb 12.8 oz (87.9 kg)   92% on   RA BMI Readings from Last 3 Encounters:  04/08/22 31.28 kg/m  04/02/22 32.12 kg/m  03/25/22 31.44 kg/m   Wt Readings from Last 3 Encounters:  04/08/22 193 lb 12.8 oz (87.9 kg)  04/02/22 199 lb (90.3 kg)  03/25/22 194 lb 12.8 oz (88.4 kg)    Physical Exam Vitals reviewed.  Constitutional:      General: He is not in acute distress.    Appearance: He is not  ill-appearing.  HENT:     Head: Normocephalic and atraumatic.  Eyes:     General: No scleral icterus. Cardiovascular:     Rate and Rhythm: Normal rate and regular rhythm.     Heart sounds: No murmur heard. Pulmonary:     Comments: Breathing comfortably on room air, lungs clear, on room air Abdominal:     General: There is no distension.     Palpations: Abdomen is soft.     Tenderness: There is no abdominal tenderness.  Musculoskeletal:        General: No swelling or deformity.     Cervical back: Neck supple.  Lymphadenopathy:     Cervical: No cervical adenopathy.  Skin:    General: Skin is warm and dry.     Findings: No rash.  Neurological:     General: No focal deficit present.     Mental Status: He is alert.     Coordination: Coordination normal.  Psychiatric:        Mood and Affect: Mood normal.        Behavior: Behavior normal.      CBC    Component Value Date/Time   WBC 8.9 04/02/2022 1013   WBC 13.2 (H) 01/20/2022 0645   RBC 4.02 (L) 04/02/2022 1013   HGB 12.7 (L) 04/02/2022 1013   HCT 38.5 (L) 04/02/2022 1013   PLT 374 04/02/2022 1013   MCV 95.8 04/02/2022 1013   MCH 31.6 04/02/2022 1013   MCHC 33.0 04/02/2022 1013   RDW 16.7 (H) 04/02/2022 1013   LYMPHSABS 0.5 (L) 04/02/2022 1013   MONOABS 0.5 04/02/2022 1013   EOSABS 0.0 04/02/2022 1013   BASOSABS 0.1 04/02/2022 1013    CHEMISTRY No results for input(s): "NA", "K", "CL", "CO2", "GLUCOSE", "BUN", "CREATININE", "CALCIUM", "MG", "PHOS" in the last 168 hours. Estimated Creatinine Clearance:  80.3 mL/min (by C-G formula based on SCr of 0.94 mg/dL).   Chest Imaging- films reviewed: CTA PE protocol 01/2021 with fibrotic changes around initial mass and enlarging nodular mass over 2 cm  CT PET 01/03/2019-severe emphysema, right hilar mass with air bronchograms, medial RLL cystic fibrotic changes.  PET avidity of the inferior portion of the mass and medial fibrotic areas of the lower lobe.  CT chest with  contrast 04/10/2018-persistent right mass, unchanged right paraseptal emphysema versus cavitary lesions.  Improved adenopathy.  CT chest with contrast 10/06/2019-bilateral centrilobular and paraseptal emphysema.  Increased opacification of right lower lobe cystic area in the medial base compared to March 2021, but improved compared to June 2021 CT.  Persistence of hilar mass on the right.  New irregularly shaped opacity in the left upper lobe.  Increased areas of patchy groundglass and septal thickening in the right upper lobe.  Micro: 01/09/2019 AFB: Component 1 mo ago  Organism ID [id number]    Comment: Mycobacterium avium complex  Amikacin Comment   Comment: 16.0 ug/mL Susceptible  Clarithromycin 2.0 ug/mL Susceptible   Linezolid Comment   Comment: 16.0 ug/mL Intermediate  Moxifloxacin 4.0 ug/mL Resistant   Streptomycin 64.0 ug/mL    01/09/2019 fungus culture-negative 01/09/2019 BAL-many PMN, negative culture 06/17/2019 respiratory culture-normal flora  Pulmonary Functions Testing Results:    Latest Ref Rng & Units 12/21/2018   12:38 PM  PFT Results  FVC-Pre L 3.07   FVC-Predicted Pre % 76   FVC-Post L 3.23   FVC-Predicted Post % 80   Pre FEV1/FVC % % 67   Post FEV1/FCV % % 71   FEV1-Pre L 2.07   FEV1-Predicted Pre % 68   FEV1-Post L 2.28   DLCO uncorrected ml/min/mmHg 15.47   DLCO UNC% % 64   DLVA Predicted % 79   TLC L 5.35   TLC % Predicted % 86   RV % Predicted % 104    2020- mild obstruction without significant bronchodilator reversibility.  No significant restriction, air trapping, or hyperinflation.  Mildly reduced diffusion.  Flow volume loop supports obstruction.  Pathology 01/09/2019:  RLL adenocarcinoma November 2020 bronch nondiagnostic     Assessment & Plan:     ICD-10-CM   1. Adenocarcinoma of right lung, stage 3 (HCC)  C34.91     2. COPD with acute bronchitis ([place])  J44.0    J20.9     3. Acute respiratory failure with hypoxia (HCC)  J96.01       Chronic hypoxemic respiratory failure: Intermittent in the past.  Persistent since hospitalization pneumonia 01/2022.  Worsening over time.  He has no reserve with his significant emphysema and radiation fibrosis, lung cancer.  I do not anticipate this will get much better.  Lung adenocarcinoma- stage IIIc (T4N3M0).  Previously received carboplatin, paclitaxel, and radiation from Dec 2020-  mid March 2021, imfimzi 02 May 2019.  Unfortunate recurrence fall 2022 now on maintenance chemotherapy, carboplatin stopped after second cycle given anaphylaxis. -Ongoing management per oncology.   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 Resistant to quinolones; intermediate susceptibility to linezolid.  Cough not a problem, low suspicion for active disease. - Traditionally, would avoid ICS but given his ongoing symptoms of shortness of breath, okay to trial and if no improvement return to using Stiolto -PLEASE AVOID MONOTHERAPY WITH  MACROLIDES DUE TO RISK OF INDUCIBLE RESISTANCE OF MAI.  Pulmonary embolus:  -Provoked in setting malignancy.  Xarelto to continue indefinitely in the setting of malignancy.    RTC in 6 weeks with Dr. [name].     Current Outpatient Medications:    albuterol (PROVENTIL) (2.5 MG/3ML) 0.083% nebulizer solution, Take 3 mLs (2.5 mg total) by nebulization every 4 (four) hours as needed for wheezing or shortness of breath., Disp: 75 mL, Rfl: 2   albuterol (VENTOLIN HFA) 108 (90 Base) MCG/ACT inhaler, Inhale 2 puffs into the lungs every 4 (four) hours as needed for wheezing or shortness of breath., Disp: 18 g, Rfl: 11   Budeson-Glycopyrrol-Formoterol (BREZTRI AEROSPHERE) 160-9-4.8 MCG/ACT AERO, Inhale 2 puffs into the lungs in the morning and at  bedtime., Disp: 1 each, Rfl: 3   dextromethorphan-guaiFENesin (MUCINEX DM) 30-600 MG 12hr tablet, Take 1 tablet by mouth 2 (two) times daily as needed for cough., Disp: 40 tablet, Rfl: 0   fluconazole (DIFLUCAN) 100 MG tablet, Take 100 mg by mouth daily., Disp: , Rfl:    folic acid (FOLVITE) 1 MG tablet, TAKE 1 TABLET BY MOUTH EVERY DAY, Disp: 90 tablet, Rfl: 1   HYDROcodone-acetaminophen (NORCO/VICODIN) 5-325 MG tablet, Take 1 tablet by mouth every 12 (twelve) hours as needed for moderate pain., Disp: 45 tablet, Rfl: 0   midodrine (PROAMATINE) 5 MG tablet, Take 1 tablet (5 mg total) by mouth 3 (three) times daily with meals., Disp: 90 tablet, Rfl: 3   Multiple Vitamins-Minerals (MULTI ADULT GUMMIES) CHEW, Chew 1 tablet by mouth in the morning and at bedtime., Disp: , Rfl:    naphazoline-pheniramine (VISINE) 0.025-0.3 % ophthalmic solution, Place 1 drop into both eyes daily as needed for eye irritation., Disp: , Rfl:    pantoprazole (PROTONIX) 40 MG tablet, Take 1 tablet (40 mg total) by mouth daily., Disp: 30 tablet, Rfl: 1   predniSONE (DELTASONE) 10 MG tablet, Take 4 tablets (40 mg total) by mouth daily with breakfast for 5 days, THEN 2 tablets (20 mg total) daily with breakfast for 5 days, THEN 1 tablet (10 mg total) daily with breakfast., Disp: 90 tablet, Rfl: 0   tamsulosin (FLOMAX) 0.4 MG CAPS capsule, Take 0.4 mg by mouth at bedtime., Disp: , Rfl:    XARELTO 20 MG TABS tablet, TAKE 1 TABLET BY MOUTH DAILY WITH SUPPER., Disp: 30 tablet, Rfl: 11  I spent 42 minutes in the care of the patient including face-to-face visit, review of records, coordination of care  Lanier Clam,  MD [facility] 04/10/2022 12:47 PM

## 2022-04-14 ENCOUNTER — Encounter: Payer: Self-pay | Admitting: Physician Assistant

## 2022-04-14 ENCOUNTER — Encounter: Payer: Self-pay | Admitting: Internal Medicine

## 2022-04-16 ENCOUNTER — Telehealth (HOSPITAL_COMMUNITY): Payer: Self-pay

## 2022-04-16 ENCOUNTER — Encounter: Payer: Self-pay | Admitting: Internal Medicine

## 2022-04-16 ENCOUNTER — Encounter (HOSPITAL_COMMUNITY): Payer: Self-pay

## 2022-04-16 ENCOUNTER — Encounter: Payer: Self-pay | Admitting: Physician Assistant

## 2022-04-16 NOTE — Telephone Encounter (Signed)
Attempted to call patient in regards to Pulmonary Rehab - LM on VM   Sent letter via [employer]

## 2022-04-17 ENCOUNTER — Telehealth (HOSPITAL_COMMUNITY): Payer: Self-pay

## 2022-04-17 NOTE — Telephone Encounter (Signed)
Pt insurance is active and benefits verified through [employer] Co-pay 0, DED $3,000/$3,000 met, out of pocket $5,000/$4,133.95 met, co-insurance 20%. no pre-authorization required, [employer] 04/17/2022@2 :15pm, REF# [id number]

## 2022-04-17 NOTE — Progress Notes (Unsigned)
[facility]  [phone] [phone]   Name: Philip [name] Date: 04/17/2022 MRN: [id number]  DOB: 11-Apr-1955  Patient Care Team: [name], MD as PCP - General (Family Medicine) Valrie Hart, RN as Oncology Nurse Navigator Leanna Sato, RN as Case Manager (General Practice) Pickenpack-Cousar, Carlena Sax, NP as Nurse Practitioner (Nurse Practitioner)   INTERVAL HISTORY: Philip [name] is a 67 y.o. male with oncologic medical history including recurrent non-small cell lung cancer (01/2019 then again 12/2020). Past medical history also includes COPD, BPH, tobacco use, PE, and GERD. Palliative ask to see for symptom management and goals of care.   SOCIAL HISTORY:     reports that he quit smoking about 4 months ago. His smoking use included cigarettes. He started smoking about 51 years ago. He has a 90.00 pack-year smoking history. He has quit using smokeless tobacco.  His smokeless tobacco use included chew. He reports current alcohol use. He reports that he does not currently use drugs after having used the following drugs: Marijuana.  ADVANCE DIRECTIVES:  None on file   CODE STATUS: Full code  PAST MEDICAL HISTORY: Past Medical History:  Diagnosis Date   COPD (chronic obstructive pulmonary disease) ([place])    Dyspnea    GERD (gastroesophageal reflux disease)    Headache    migraines   Hyperlipidemia    lung ca dx'd 12/2018   On home oxygen therapy    3 to 10 L   Pneumonia    Pulmonary embolus (HCC)    Tobacco abuse     ALLERGIES:  is allergic to carboplatin, klonopin [clonazepam], and norco [hydrocodone-acetaminophen].  MEDICATIONS:  Current Outpatient Medications  Medication Sig Dispense Refill   albuterol (PROVENTIL) (2.5 MG/3ML) 0.083% nebulizer solution Take 3 mLs (2.5 mg total) by nebulization every 4 (four) hours as needed for wheezing or shortness of breath. 75 mL 2   albuterol (VENTOLIN HFA) 108  (90 Base) MCG/ACT inhaler Inhale 2 puffs into the lungs every 4 (four) hours as needed for wheezing or shortness of breath. 18 g 11   Budeson-Glycopyrrol-Formoterol (BREZTRI AEROSPHERE) 160-9-4.8 MCG/ACT AERO Inhale 2 puffs into the lungs in the morning and at bedtime. 1 each 3   dextromethorphan-guaiFENesin (MUCINEX DM) 30-600 MG 12hr tablet Take 1 tablet by mouth 2 (two) times daily as needed for cough. 40 tablet 0   fluconazole (DIFLUCAN) 100 MG tablet Take 100 mg by mouth daily.     folic acid (FOLVITE) 1 MG tablet TAKE 1 TABLET BY MOUTH EVERY DAY 90 tablet 1   HYDROcodone-acetaminophen (NORCO/VICODIN) 5-325 MG tablet Take 1 tablet by mouth every 12 (twelve) hours as needed for moderate pain. 45 tablet 0   midodrine (PROAMATINE) 5 MG tablet Take 1 tablet (5 mg total) by mouth 3 (three) times daily with meals. 90 tablet 3   Multiple Vitamins-Minerals (MULTI ADULT GUMMIES) CHEW Chew 1 tablet by mouth in the morning and at bedtime.     naphazoline-pheniramine (VISINE) 0.025-0.3 % ophthalmic solution Place 1 drop into both eyes daily as needed for eye irritation.     pantoprazole (PROTONIX) 40 MG tablet Take 1 tablet (40 mg total) by mouth daily. 30 tablet 1   predniSONE (DELTASONE) 10 MG tablet Take 4 tablets (40 mg total) by mouth daily with breakfast for 5 days, THEN 2 tablets (20 mg total) daily with breakfast for 5 days, THEN 1 tablet (10 mg total) daily with breakfast. 90 tablet 0   tamsulosin (  FLOMAX) 0.4 MG CAPS capsule Take 0.4 mg by mouth at bedtime.     XARELTO 20 MG TABS tablet TAKE 1 TABLET BY MOUTH DAILY WITH SUPPER. 30 tablet 11   No current facility-administered medications for this visit.    VITAL SIGNS: There were no vitals taken for this visit. There were no vitals filed for this visit.  Estimated body mass index is 31.28 kg/m as calculated from the following:   Height as of 03/25/22: 5\' 6"  (1.676 m).   Weight as of 04/08/22: 193 lb 12.8 oz (87.9 kg).   PERFORMANCE STATUS  (ECOG) : 1 - Symptomatic but completely ambulatory   Physical Exam General: NAD, in wheelchair  Cardiovascular: regular rate and rhythm Pulmonary: normal breathing pattern, oxygen in place  Abdomen: soft, nontender, + bowel sounds Extremities: no edema, no joint deformities Skin: no rashes Neurological: AAO x4  IMPRESSION:   Neoplasm related pain [name] reports his pain is better controlled. He has been able to sleep throughout the night. Is as active as much as possible.    He is taking Hydrocodone 5/325mg  as needed. Does not require around the clock. Averages once daily depending on level of pain.   Education provided on use of stool softeners if he is not having daily bowel movements. Aware if more than 2 days would need to make part of his daily regimen.   PLAN:  Hydrocodone 5/325 mg one tablet every 12 hours as needed for moderate to severe pain.  Pain contract completed on 2/7 Bowel regimen  Smoking cessation Ongoing goals of care and symptom management support.  I will plan to see patient back in 2-4 weeks in collaboration to other oncology appointments.    Patient expressed understanding and was in agreement with this plan. He also understands that He can call the clinic at any time with any questions, concerns, or complaints.     Any controlled substances utilized were prescribed in the context of palliative care. PDMP has been reviewed.    Time Total: 20 min   Visit consisted of counseling and education dealing with the complex and emotionally intense issues of symptom management and palliative care in the setting of serious and potentially life-threatening illness.Greater than 50%  of this time was spent counseling and coordinating care related to the above assessment and plan.  Alda Lea, AGPCNP-BC  Palliative Medicine Team/[facility]

## 2022-04-23 ENCOUNTER — Inpatient Hospital Stay (HOSPITAL_BASED_OUTPATIENT_CLINIC_OR_DEPARTMENT_OTHER): Payer: No Typology Code available for payment source | Admitting: Nurse Practitioner

## 2022-04-23 ENCOUNTER — Other Ambulatory Visit: Payer: Self-pay | Admitting: Internal Medicine

## 2022-04-23 ENCOUNTER — Encounter: Payer: Self-pay | Admitting: Internal Medicine

## 2022-04-23 ENCOUNTER — Inpatient Hospital Stay: Payer: No Typology Code available for payment source

## 2022-04-23 ENCOUNTER — Inpatient Hospital Stay: Payer: No Typology Code available for payment source | Attending: Internal Medicine | Admitting: Internal Medicine

## 2022-04-23 ENCOUNTER — Encounter: Payer: Self-pay | Admitting: Nurse Practitioner

## 2022-04-23 ENCOUNTER — Encounter: Payer: Self-pay | Admitting: Physician Assistant

## 2022-04-23 VITALS — BP 114/81 | HR 103 | Temp 98.3°F | Resp 17 | Wt 199.6 lb

## 2022-04-23 DIAGNOSIS — G893 Neoplasm related pain (acute) (chronic): Secondary | ICD-10-CM

## 2022-04-23 DIAGNOSIS — C3491 Malignant neoplasm of unspecified part of right bronchus or lung: Secondary | ICD-10-CM

## 2022-04-23 DIAGNOSIS — Z515 Encounter for palliative care: Secondary | ICD-10-CM | POA: Diagnosis not present

## 2022-04-23 DIAGNOSIS — R1013 Epigastric pain: Secondary | ICD-10-CM | POA: Diagnosis not present

## 2022-04-23 DIAGNOSIS — M25512 Pain in left shoulder: Secondary | ICD-10-CM | POA: Diagnosis not present

## 2022-04-23 DIAGNOSIS — C3490 Malignant neoplasm of unspecified part of unspecified bronchus or lung: Secondary | ICD-10-CM

## 2022-04-23 DIAGNOSIS — R530 Neoplastic (malignant) related fatigue: Secondary | ICD-10-CM

## 2022-04-23 DIAGNOSIS — Z86711 Personal history of pulmonary embolism: Secondary | ICD-10-CM | POA: Diagnosis not present

## 2022-04-23 DIAGNOSIS — Z7901 Long term (current) use of anticoagulants: Secondary | ICD-10-CM | POA: Diagnosis not present

## 2022-04-23 LAB — CBC WITH DIFFERENTIAL (CANCER CENTER ONLY)
Abs Immature Granulocytes: 0.12 10*3/uL — ABNORMAL HIGH (ref 0.00–0.07)
Basophils Absolute: 0 10*3/uL (ref 0.0–0.1)
Basophils Relative: 0 %
Eosinophils Absolute: 0 10*3/uL (ref 0.0–0.5)
Eosinophils Relative: 0 %
HCT: 38.9 % — ABNORMAL LOW (ref 39.0–52.0)
Hemoglobin: 12.1 g/dL — ABNORMAL LOW (ref 13.0–17.0)
Immature Granulocytes: 1 %
Lymphocytes Relative: 7 %
Lymphs Abs: 0.6 10*3/uL — ABNORMAL LOW (ref 0.7–4.0)
MCH: 30.6 pg (ref 26.0–34.0)
MCHC: 31.1 g/dL (ref 30.0–36.0)
MCV: 98.5 fL (ref 80.0–100.0)
Monocytes Absolute: 0.6 10*3/uL (ref 0.1–1.0)
Monocytes Relative: 7 %
Neutro Abs: 7.8 10*3/uL — ABNORMAL HIGH (ref 1.7–7.7)
Neutrophils Relative %: 85 %
Platelet Count: 317 10*3/uL (ref 150–400)
RBC: 3.95 MIL/uL — ABNORMAL LOW (ref 4.22–5.81)
RDW: 17.1 % — ABNORMAL HIGH (ref 11.5–15.5)
WBC Count: 9.3 10*3/uL (ref 4.0–10.5)
nRBC: 0 % (ref 0.0–0.2)

## 2022-04-23 LAB — CMP (CANCER CENTER ONLY)
ALT: 17 U/L (ref 0–44)
AST: 19 U/L (ref 15–41)
Albumin: 3.5 g/dL (ref 3.5–5.0)
Alkaline Phosphatase: 94 U/L (ref 38–126)
Anion gap: 7 (ref 5–15)
BUN: 19 mg/dL (ref 8–23)
CO2: 34 mmol/L — ABNORMAL HIGH (ref 22–32)
Calcium: 9.4 mg/dL (ref 8.9–10.3)
Chloride: 101 mmol/L (ref 98–111)
Creatinine: 1.02 mg/dL (ref 0.61–1.24)
GFR, Estimated: >60 mL/min
Glucose, Bld: 117 mg/dL — ABNORMAL HIGH (ref 70–99)
Potassium: 5.2 mmol/L — ABNORMAL HIGH (ref 3.5–5.1)
Sodium: 142 mmol/L (ref 135–145)
Total Bilirubin: 0.4 mg/dL (ref 0.3–1.2)
Total Protein: 7.1 g/dL (ref 6.5–8.1)

## 2022-04-23 MED ORDER — PANTOPRAZOLE SODIUM 40 MG PO TBEC: 40.0000 mg | DELAYED_RELEASE_TABLET | Freq: Every day | ORAL | 1 refills | Status: DC

## 2022-04-23 NOTE — Progress Notes (Signed)
[place] [phone]   [phone]  OFFICE PROGRESS NOTE  Philip [name] [address] [id number]  DIAGNOSIS:  1) recurrent non-small cell lung cancer initially diagnosed as stage IIIc (T4, N3, M0) non-small cell lung cancer, adenocarcinoma presented with large right hilar mass in addition to bilateral hilar and mediastinal lymphadenopathy diagnosed in December 2020.  The patient has evidence for disease recurrence in October 2022. 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  Started on Xarelto on March 17, 2020 and currently 20 mg p.o. daily.   PRIOR THERAPY:  1) Weekly concurrent chemoradiation with carboplatin for an AUC of 2 and paclitaxel 45 mg/m2. First dose starting 01/30/2019.   Status post 6 cycles. 2) Consolidation immunotherapy with Imfinzi 1500 mg IV every 4 weeks. First dose expected on 04/19/2019.  Status post 13 cycles. 3) disease recurrence in October 2022.   CURRENT THERAPY: Systemic chemotherapy with carboplatin for AUC of 5, Alimta 500 Mg/M2 and Keytruda 200 Mg IV every 3 weeks.  First dose January 01, 2021.  Status post 21 cycles.  He had hypersensitivity reaction to carboplatin and this was discontinued after cycle #2.  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  INTERVAL HISTORY: Philip [name] 67 y.o. male returns to the clinic today for follow-up visit accompanied by his wife.  The patient is feeling fine except for the baseline fatigue and shortness of breath and he is currently on home oxygen.  He has cushingoid features of the face as well as the swelling of the lower extremity from the lung term treatment with prednisone.  He is currently on prednisone 10 mg p.o. daily.  He denied having any current chest pain but has mild cough with  no hemoptysis.  He has no nausea, vomiting, diarrhea or constipation.  He has no headache or visual changes.  He has no recent weight loss or night sweats.  He is here today for evaluation before starting cycle #22 of his treatment.    MEDICAL HISTORY: Past Medical History:  Diagnosis Date   COPD (chronic obstructive pulmonary disease) (HCC)    Dyspnea    GERD (gastroesophageal reflux disease)    Headache    migraines   Hyperlipidemia    lung ca dx'd 12/2018   On home oxygen therapy    3 to 10 L   Pneumonia    Pulmonary embolus (HCC)    Tobacco abuse     ALLERGIES:  is allergic to carboplatin, klonopin [clonazepam], and norco [hydrocodone-acetaminophen].  MEDICATIONS:  Current Outpatient Medications  Medication Sig Dispense Refill   albuterol (PROVENTIL) (2.5 MG/3ML) 0.083% nebulizer solution Take 3 mLs (2.5 mg total) by nebulization every 4 (four) hours as needed for wheezing or shortness of breath. 75 mL 2   albuterol (VENTOLIN HFA) 108 (90 Base) MCG/ACT inhaler Inhale 2 puffs into the lungs every 4 (four) hours as needed for wheezing or shortness of breath. 18 g 11   Budeson-Glycopyrrol-Formoterol (BREZTRI AEROSPHERE) 160-9-4.8 MCG/ACT AERO Inhale 2 puffs into the lungs in the morning and at bedtime. 1 each 3   dextromethorphan-guaiFENesin (MUCINEX DM) 30-600 MG 12hr tablet Take 1 tablet by mouth 2 (two) times daily as needed for cough. 40 tablet 0   fluconazole (DIFLUCAN) 100 MG tablet Take 100 mg by mouth daily.  folic acid (FOLVITE) 1 MG tablet TAKE 1 TABLET BY MOUTH EVERY DAY 90 tablet 1   HYDROcodone-acetaminophen (NORCO/VICODIN) 5-325 MG tablet Take 1 tablet by mouth every 12 (twelve) hours as needed for moderate pain. 45 tablet 0   midodrine (PROAMATINE) 5 MG tablet Take 1 tablet (5 mg total) by mouth 3 (three) times daily with meals. 90 tablet 3   Multiple Vitamins-Minerals (MULTI ADULT GUMMIES) CHEW Chew 1 tablet by mouth in the morning and at bedtime.      naphazoline-pheniramine (VISINE) 0.025-0.3 % ophthalmic solution Place 1 drop into both eyes daily as needed for eye irritation.     pantoprazole (PROTONIX) 40 MG tablet Take 1 tablet (40 mg total) by mouth daily. 30 tablet 1   predniSONE (DELTASONE) 10 MG tablet Take 4 tablets (40 mg total) by mouth daily with breakfast for 5 days, THEN 2 tablets (20 mg total) daily with breakfast for 5 days, THEN 1 tablet (10 mg total) daily with breakfast. 90 tablet 0   tamsulosin (FLOMAX) 0.4 MG CAPS capsule Take 0.4 mg by mouth at bedtime.     XARELTO 20 MG TABS tablet TAKE 1 TABLET BY MOUTH DAILY WITH SUPPER. 30 tablet 11   No current facility-administered medications for this visit.    SURGICAL HISTORY:  Past Surgical History:  Procedure Laterality Date   COLONOSCOPY     CYSTOSCOPY W/ URETERAL STENT PLACEMENT Right 10/24/2021   Procedure: CYSTOSCOPY WITH RETROGRADE PYELOGRAM/URETERAL STENT PLACEMENT;  Surgeon: Janith Lima, MD;  Location: [facility];  Service: Urology;  Laterality: Right;   HAND SURGERY     VASECTOMY     VIDEO BRONCHOSCOPY WITH ENDOBRONCHIAL ULTRASOUND N/A 12/22/2018   Procedure: VIDEO BRONCHOSCOPY WITH ENDOBRONCHIAL ULTRASOUND;  Surgeon: Garner Nash, DO;  Location: [facility];  Service: Thoracic;  Laterality: N/A;   VIDEO BRONCHOSCOPY WITH ENDOBRONCHIAL ULTRASOUND N/A 01/09/2019   Procedure: VIDEO BRONCHOSCOPY WITH ENDOBRONCHIAL ULTRASOUND WITH FLUORO;  Surgeon: Garner Nash, DO;  Location: [facility];  Service: Thoracic;  Laterality: N/A;   VIDEO BRONCHOSCOPY WITH RADIAL ENDOBRONCHIAL ULTRASOUND N/A 12/22/2018   Procedure: RADIAL ENDOBRONCHIAL ULTRASOUND;  Surgeon: Garner Nash, DO;  Location: [facility];  Service: Thoracic;  Laterality: N/A;    REVIEW OF SYSTEMS:  A comprehensive review of systems was negative except for: Constitutional: positive for fatigue Respiratory: positive for cough and dyspnea on exertion   PHYSICAL EXAMINATION: General appearance: alert, cooperative,  fatigued, and no distress Head: Normocephalic, without obvious abnormality, atraumatic Neck: no adenopathy, no JVD, supple, symmetrical, trachea midline, and thyroid not enlarged, symmetric, no tenderness/mass/nodules Lymph nodes: Cervical, supraclavicular, and axillary nodes normal. Resp: rales bilaterally and wheezes bilaterally Back: symmetric, no curvature. ROM normal. No CVA tenderness. Cardio: regular rate and rhythm, S1, S2 normal, no murmur, click, rub or gallop GI: soft, non-tender; bowel sounds normal; no masses,  no organomegaly Extremities: extremities normal, atraumatic, no cyanosis or edema  ECOG PERFORMANCE STATUS: 1 - Symptomatic but completely ambulatory  Blood pressure 114/81, pulse (!) 103, temperature 98.3 F (36.8 C), temperature source Oral, resp. rate 17, weight 199 lb 9 oz (90.5 kg), SpO2 95 %.  LABORATORY DATA: Lab Results  Component Value Date   WBC 9.3 04/23/2022   HGB 12.1 (L) 04/23/2022   HCT 38.9 (L) 04/23/2022   MCV 98.5 04/23/2022   PLT 317 04/23/2022      Chemistry      Component Value Date/Time   NA 142 04/23/2022 1008   K 5.2 (H) 04/23/2022 1008  CL 101 04/23/2022 1008   CO2 34 (H) 04/23/2022 1008   BUN 19 04/23/2022 1008   CREATININE 1.02 04/23/2022 1008      Component Value Date/Time   CALCIUM 9.4 04/23/2022 1008   ALKPHOS 94 04/23/2022 1008   AST 19 04/23/2022 1008   ALT 17 04/23/2022 1008   BILITOT 0.4 04/23/2022 1008       RADIOGRAPHIC STUDIES: DG Chest 2 View  Result Date: 03/25/2022 CLINICAL DATA:  COPD, increased oxygen demand EXAM: CHEST - 2 VIEW COMPARISON:  01/19/2022, 03/11/2022 FINDINGS: Frontal and lateral views of the chest demonstrate patchy bilateral airspace disease superimposed upon background emphysema, scarring, and bronchiectasis. Chronic elevation of the right hemidiaphragm. No effusion or pneumothorax. No acute bony abnormalities. IMPRESSION: 1. Patchy bilateral airspace disease superimposed upon background  scarring and emphysema. Favor multifocal infection over edema. Electronically Signed   By: Randa Ngo M.D.   On: 03/25/2022 15:22     ASSESSMENT AND PLAN: This is a very pleasant 67 years old white male with metastatic non-small cell lung cancer that was initially diagnosed as stage IIIc non-small cell lung cancer, adenocarcinoma diagnosed in December 2020 The patient completed a course of concurrent chemoradiation with weekly carboplatin and paclitaxel status post 6 cycles and he tolerated his treatment well and has partial response. He underwent consolidation treatment with immunotherapy with Imfinzi 1500 mg IV every 4 weeks status post 13 cycles. He was on observation and feeling fine with no concerning complaints except for mild cough as well as the left shoulder pain. His scan showed increase in the size of the soft tissue nodule in the right middle lobe adjacent to the postradiation changes highly suspicious for recurrent disease.  There was also soft tissue of the right hilum/as ago esophageal recess unchanged at but increased compared to remote prior studies. The patient had a PET scan that showed hypermetabolic nodule in the right lower lobe adjacent to the post radiation treatment site increased in size and consistent with lung cancer recurrence.  There was second hypermetabolic nodule in the right lower lobe above the diaphragm concerning for recurrence and hypermetabolic contralateral lymph node in the high left paratracheal mediastinum also concerning for metastatic adenopathy recurrence. He started systemic chemotherapy with carboplatin for AUC of 5, Alimta 500 Mg/M2 and Keytruda 200 Mg IV every 3 weeks on January 01, 2021.   Carboplatin was discontinued with cycle #2 secondary to hypersensitivity reaction.  The patient is status post 21 cycles.  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  The patient is  currently on single agent treatment with Alimta. He tolerated the last cycle of his treatment fairly well except for the baseline fatigue and shortness of breath. The patient has been on long-term treatment with prednisone as well as doxycycline for recurrent pneumonia and COPD. I had a lengthy discussion with him today about his condition and I recommended for him to take a break off treatment for the next 2 months because of his poor condition. I will see him back for follow-up visit with repeat CT scan of the chest, abdomen and pelvis for restaging of his disease. He was advised to call immediately if he has any other concerning symptoms in the interval. The patient voices understanding of current disease status and treatment options and is in agreement with the current care plan. All questions were answered. The patient knows to call the clinic with any problems, questions or concerns. We can  certainly see the patient much sooner if necessary. The total time spent in the appointment was 20 minutes.  Disclaimer: This note was dictated with voice recognition software. Similar sounding words can inadvertently be transcribed and may not be corrected upon review.

## 2022-04-24 ENCOUNTER — Telehealth: Payer: Self-pay | Admitting: Internal Medicine

## 2022-04-24 NOTE — Telephone Encounter (Signed)
Scheduled per 03/21 los, patient has been called and notified of upcoming appointments.

## 2022-04-28 ENCOUNTER — Encounter: Payer: Self-pay | Admitting: Physician Assistant

## 2022-04-28 ENCOUNTER — Encounter: Payer: Self-pay | Admitting: Internal Medicine

## 2022-05-08 ENCOUNTER — Other Ambulatory Visit: Payer: Self-pay | Admitting: Pulmonary Disease

## 2022-05-13 ENCOUNTER — Telehealth: Payer: Self-pay | Admitting: Medical Oncology

## 2022-05-13 ENCOUNTER — Encounter: Payer: Self-pay | Admitting: Pulmonary Disease

## 2022-05-13 ENCOUNTER — Encounter: Payer: Self-pay | Admitting: Internal Medicine

## 2022-05-13 ENCOUNTER — Other Ambulatory Visit: Payer: Self-pay | Admitting: Internal Medicine

## 2022-05-13 ENCOUNTER — Encounter: Payer: Self-pay | Admitting: Physician Assistant

## 2022-05-13 ENCOUNTER — Ambulatory Visit (INDEPENDENT_AMBULATORY_CARE_PROVIDER_SITE_OTHER): Payer: No Typology Code available for payment source | Admitting: Pulmonary Disease

## 2022-05-13 VITALS — BP 126/74 | Wt 199.0 lb

## 2022-05-13 DIAGNOSIS — G893 Neoplasm related pain (acute) (chronic): Secondary | ICD-10-CM

## 2022-05-13 DIAGNOSIS — J9601 Acute respiratory failure with hypoxia: Secondary | ICD-10-CM

## 2022-05-13 DIAGNOSIS — Z515 Encounter for palliative care: Secondary | ICD-10-CM

## 2022-05-13 DIAGNOSIS — C3491 Malignant neoplasm of unspecified part of right bronchus or lung: Secondary | ICD-10-CM | POA: Diagnosis not present

## 2022-05-13 DIAGNOSIS — J449 Chronic obstructive pulmonary disease, unspecified: Secondary | ICD-10-CM | POA: Diagnosis not present

## 2022-05-13 DIAGNOSIS — C3490 Malignant neoplasm of unspecified part of unspecified bronchus or lung: Secondary | ICD-10-CM

## 2022-05-13 MED ORDER — HYDROCODONE-ACETAMINOPHEN 5-325 MG PO TABS: 1.0000 | ORAL_TABLET | Freq: Two times a day (BID) | ORAL | 0 refills | Status: DC | PRN

## 2022-05-13 MED ORDER — AMOXICILLIN-POT CLAVULANATE 875-125 MG PO TABS: 1.0000 | ORAL_TABLET | Freq: Two times a day (BID) | ORAL | 0 refills | Status: AC

## 2022-05-13 NOTE — Progress Notes (Signed)
Synopsis: Referred in January 2020 for pulmonary MAC by Farris Has, MD.  Previously seen by Dr. Erick Colace.  Previously seen by Dr. Tonia Brooms.  Subjective:   PATIENT ID: Philip [name] GENDER: male DOB: November 29, 1955, MRN: [id number]  Chief Complaint  Patient presents with   Follow-up    Pt is here for follow up for stage 3 adenocarcinoma. Pt is stating he feels like he is needing more and more oxygen. Pt states that when he was in the hospital on steroids he did not feels as if he needed as much oxygen. He is on 10mg  daily right now. Pt is asking for face mack instead of nasal cannula for emergencies.     Philip [name] is a 67 y.o. gentleman with a history of stage III lung adenocarcinoma, COPD With worsened symptoms recently over last several weeks now severe hypoxemia on up to 8 L nasal cannula.    Remains severely dyspneic.  Still requiring 8 L at rest.  Even higher with exertion.  Long discussion today about the severity of disease.  Unlikely to improve.  Okay to try additional things to try to make things better.  Continue inhalers etc.  Was prescribed prednisone taper at last visit but he thinks it was not very helpful.  Symptoms largely unchanged.  Continue on prednisone 10 mg daily at this time.  Discussed trying antibiotics.  Discussed goals of care as well, see below.        Past Medical History:  Diagnosis Date   COPD (chronic obstructive pulmonary disease) (HCC)    Dyspnea    GERD (gastroesophageal reflux disease)    Headache    migraines   Hyperlipidemia    lung ca dx'd 12/2018   On home oxygen therapy    3 to 10 L   Pneumonia    Pulmonary embolus (HCC)    Tobacco abuse      Family History  Problem Relation Age of Onset   Cancer Father    Stomach cancer Father      Past Surgical History:  Procedure Laterality Date   COLONOSCOPY     CYSTOSCOPY W/ URETERAL STENT PLACEMENT Right 10/24/2021   Procedure: CYSTOSCOPY WITH RETROGRADE PYELOGRAM/URETERAL STENT  PLACEMENT;  Surgeon: Jannifer Hick, MD;  Location: [facility];  Service: Urology;  Laterality: Right;   HAND SURGERY     VASECTOMY     VIDEO BRONCHOSCOPY WITH ENDOBRONCHIAL ULTRASOUND N/A 12/22/2018   Procedure: VIDEO BRONCHOSCOPY WITH ENDOBRONCHIAL ULTRASOUND;  Surgeon: Josephine Igo, DO;  Location: [facility];  Service: Thoracic;  Laterality: N/A;   VIDEO BRONCHOSCOPY WITH ENDOBRONCHIAL ULTRASOUND N/A 01/09/2019   Procedure: VIDEO BRONCHOSCOPY WITH ENDOBRONCHIAL ULTRASOUND WITH FLUORO;  Surgeon: Josephine Igo, DO;  Location: [facility];  Service: Thoracic;  Laterality: N/A;   VIDEO BRONCHOSCOPY WITH RADIAL ENDOBRONCHIAL ULTRASOUND N/A 12/22/2018   Procedure: RADIAL ENDOBRONCHIAL ULTRASOUND;  Surgeon: Josephine Igo, DO;  Location: [facility];  Service: Thoracic;  Laterality: N/A;    Social History   Socioeconomic History   Marital status: Married    Spouse name: Not on file   Number of children: Not on file   Years of education: Not on file   Highest education level: Not on file  Occupational History   Not on file  Tobacco Use   Smoking status: Former    Packs/day: 2.00    Years: 45.00    Additional pack years: 0.00    Total pack years: 90.00    Types: Cigarettes  Start date: 07/04/1970    Quit date: 12/2021    Years since quitting: 0.4   Smokeless tobacco: Former    Types: Chew   Tobacco comments:    Smoking 2 packs of cigarettes a week. 10/14/2021 Tay    Smoking is on and off, will go a week without smoking then start up and smoke half a pack a day  Vaping Use   Vaping Use: Never used  Substance and Sexual Activity   Alcohol use: Yes    Comment: hasn't drank in one month   Drug use: Not Currently    Types: Marijuana   Sexual activity: Not on file  Other Topics Concern   Not on file  Social History Narrative   Not on file   Social Determinants of Health   Financial Resource Strain: Not on file  Food Insecurity: No Food Insecurity (01/12/2022)   Hunger Vital Sign     Worried About Running Out of Food in the Last Year: Never true    Ran Out of Food in the Last Year: Never true  Transportation Needs: No Transportation Needs (01/12/2022)   PRAPARE - Administrator, Civil Service (Medical): No    Lack of Transportation (Non-Medical): No  Physical Activity: Not on file  Stress: Not on file  Social Connections: Not on file  Intimate Partner Violence: Not At Risk (01/12/2022)   Humiliation, Afraid, Rape, and Kick questionnaire    Fear of Current or Ex-Partner: No    Emotionally Abused: No    Physically Abused: No    Sexually Abused: No     Allergies  Allergen Reactions   Carboplatin Anaphylaxis, Shortness Of Breath, Cough and Hypertension    On dose 8. Became short of breath. Symptom management was called. Received benadryl, solumedrol, Pepcid and fluids. Medication discontinued.    Klonopin [Clonazepam] Other (See Comments)    nervous   Norco [Hydrocodone-Acetaminophen] Other (See Comments)    Made pt feel jittery      Immunization History  Administered Date(s) Administered   Influenza Split 11/20/2008   Influenza,inj,Quad PF,6+ Mos 10/24/2018, 11/08/2018, 01/15/2020   Influenza,inj,quad, With Preservative 03/15/2015   PFIZER(Purple Top)SARS-COV-2 Vaccination 04/27/2019, 05/11/2019, 12/07/2019, 12/26/2019   PNEUMOCOCCAL CONJUGATE-20 05/12/2021   Pneumococcal Polysaccharide-23 12/12/2007, 10/10/2019   Td 05/18/2014   Tdap 11/16/2006, 05/18/2014    Outpatient Medications Prior to Visit  Medication Sig Dispense Refill   albuterol (PROVENTIL) (2.5 MG/3ML) 0.083% nebulizer solution Take 3 mLs (2.5 mg total) by nebulization every 4 (four) hours as needed for wheezing or shortness of breath. 75 mL 2   albuterol (VENTOLIN HFA) 108 (90 Base) MCG/ACT inhaler Inhale 2 puffs into the lungs every 4 (four) hours as needed for wheezing or shortness of breath. 18 g 11   Budeson-Glycopyrrol-Formoterol (BREZTRI AEROSPHERE) 160-9-4.8 MCG/ACT AERO  Inhale 2 puffs into the lungs in the morning and at bedtime. 1 each 3   dextromethorphan-guaiFENesin (MUCINEX DM) 30-600 MG 12hr tablet Take 1 tablet by mouth 2 (two) times daily as needed for cough. 40 tablet 0   fluconazole (DIFLUCAN) 100 MG tablet Take 100 mg by mouth daily.     folic acid (FOLVITE) 1 MG tablet TAKE 1 TABLET BY MOUTH EVERY DAY 90 tablet 1   HYDROcodone-acetaminophen (NORCO/VICODIN) 5-325 MG tablet Take 1 tablet by mouth every 12 (twelve) hours as needed for moderate pain. 45 tablet 0   midodrine (PROAMATINE) 5 MG tablet Take 1 tablet (5 mg total) by mouth 3 (three) times daily with  meals. 90 tablet 3   Multiple Vitamins-Minerals (MULTI ADULT GUMMIES) CHEW Chew 1 tablet by mouth in the morning and at bedtime.     naphazoline-pheniramine (VISINE) 0.025-0.3 % ophthalmic solution Place 1 drop into both eyes daily as needed for eye irritation.     pantoprazole (PROTONIX) 40 MG tablet Take 1 tablet (40 mg total) by mouth daily. 30 tablet 1   predniSONE (DELTASONE) 10 MG tablet TAKE 4 TABLETSBY MOUTH DAILY WITH BREAKFAST FOR 5 DAYS, 2 DAILY FOR 5 DAYS, THEN 1 TABLET DAILY WITH BREAKFAST. 90 tablet 0   tamsulosin (FLOMAX) 0.4 MG CAPS capsule Take 0.4 mg by mouth at bedtime.     XARELTO 20 MG TABS tablet TAKE 1 TABLET BY MOUTH DAILY WITH SUPPER. 30 tablet 11   No facility-administered medications prior to visit.   Review of systems: N/AA   Objective:   Vitals:   05/13/22 1313  BP: 126/74  SpO2: 95%  Weight: 199 lb (90.3 kg)   95% on   RA BMI Readings from Last 3 Encounters:  05/13/22 32.12 kg/m  04/23/22 32.21 kg/m  04/08/22 31.28 kg/m   Wt Readings from Last 3 Encounters:  05/13/22 199 lb (90.3 kg)  04/23/22 199 lb 9 oz (90.5 kg)  04/08/22 193 lb 12.8 oz (87.9 kg)    Physical Exam Vitals reviewed.  Constitutional:      General: He is not in acute distress.    Appearance: He is not ill-appearing.  HENT:     Head: Normocephalic and atraumatic.  Eyes:      General: No scleral icterus. Cardiovascular:     Rate and Rhythm: Normal rate and regular rhythm.     Heart sounds: No murmur heard. Pulmonary:     Comments: Breathing comfortably on room air, lungs clear, on room air Abdominal:     General: There is no distension.     Palpations: Abdomen is soft.     Tenderness: There is no abdominal tenderness.  Musculoskeletal:        General: No swelling or deformity.     Cervical back: Neck supple.  Lymphadenopathy:     Cervical: No cervical adenopathy.  Skin:    General: Skin is warm and dry.     Findings: No rash.  Neurological:     General: No focal deficit present.     Mental Status: He is alert.     Coordination: Coordination normal.  Psychiatric:        Mood and Affect: Mood normal.        Behavior: Behavior normal.      CBC    Component Value Date/Time   WBC 9.3 04/23/2022 1008   WBC 13.2 (H) 01/20/2022 0645   RBC 3.95 (L) 04/23/2022 1008   HGB 12.1 (L) 04/23/2022 1008   HCT 38.9 (L) 04/23/2022 1008   PLT 317 04/23/2022 1008   MCV 98.5 04/23/2022 1008   MCH 30.6 04/23/2022 1008   MCHC 31.1 04/23/2022 1008   RDW 17.1 (H) 04/23/2022 1008   LYMPHSABS 0.6 (L) 04/23/2022 1008   MONOABS 0.6 04/23/2022 1008   EOSABS 0.0 04/23/2022 1008   BASOSABS 0.0 04/23/2022 1008    CHEMISTRY No results for input(s): "NA", "K", "CL", "CO2", "GLUCOSE", "BUN", "CREATININE", "CALCIUM", "MG", "PHOS" in the last 168 hours. Estimated Creatinine Clearance: 75 mL/min (by C-G formula based on SCr of 1.02 mg/dL).   Chest Imaging- films reviewed: CTA PE protocol 01/2021 with fibrotic changes around initial mass and enlarging nodular mass over  2 cm  CT PET 01/03/2019-severe emphysema, right hilar mass with air bronchograms, medial RLL cystic fibrotic changes.  PET avidity of the inferior portion of the mass and medial fibrotic areas of the lower lobe.  CT chest with contrast 04/10/2018-persistent right mass, unchanged right paraseptal emphysema  versus cavitary lesions.  Improved adenopathy.  CT chest with contrast 10/06/2019-bilateral centrilobular and paraseptal emphysema.  Increased opacification of right lower lobe cystic area in the medial base compared to March 2021, but improved compared to June 2021 CT.  Persistence of hilar mass on the right.  New irregularly shaped opacity in the left upper lobe.  Increased areas of patchy groundglass and septal thickening in the right upper lobe.  Micro: 01/09/2019 AFB: Component 1 mo ago  Organism ID [id number]    Comment: Mycobacterium avium complex  Amikacin Comment   Comment: 16.0 ug/mL Susceptible  Clarithromycin 2.0 ug/mL Susceptible   Linezolid Comment   Comment: 16.0 ug/mL Intermediate  Moxifloxacin 4.0 ug/mL Resistant   Streptomycin 64.0 ug/mL    01/09/2019 fungus culture-negative 01/09/2019 BAL-many PMN, negative culture 06/17/2019 respiratory culture-normal flora  Pulmonary Functions Testing Results:    Latest Ref Rng & Units 12/21/2018   12:38 PM  PFT Results  FVC-Pre L 3.07   FVC-Predicted Pre % 76   FVC-Post L 3.23   FVC-Predicted Post % 80   Pre FEV1/FVC % % 67   Post FEV1/FCV % % 71   FEV1-Pre L 2.07   FEV1-Predicted Pre % 68   FEV1-Post L 2.28   DLCO uncorrected ml/min/mmHg 15.47   DLCO UNC% % 64   DLVA Predicted % 79   TLC L 5.35   TLC % Predicted % 86   RV % Predicted % 104    2020- mild obstruction without significant bronchodilator reversibility.  No significant restriction, air trapping, or hyperinflation.  Mildly reduced diffusion.  Flow volume loop supports obstruction.  Pathology 01/09/2019:  RLL adenocarcinoma November 2020 bronch nondiagnostic     Assessment & Plan:   No diagnosis found.  Chronic hypoxemic respiratory failure: Intermittent in the past.  Persistent since hospitalization pneumonia 01/2022.  Worsening over time.  He has no reserve with his significant emphysema and radiation fibrosis, lung cancer.  I do not anticipate this  will get much better.  Discussed anticipated will eventually continue to worsen. -- Additional order for home concentrator given requiring now 10+ liters with exertion -- New order for Oxymizer as well as nonrebreather to help him recover additional oxygen at home so he can be more active  Lung adenocarcinoma- stage IIIc (T4N3M0).  Previously received carboplatin, paclitaxel, and radiation from Dec 2020-  mid March 2021, imfimzi 02 May 2019.  Unfortunate recurrence fall 2022 now on maintenance chemotherapy, carboplatin stopped after second cycle given anaphylaxis. -Ongoing management per oncology.   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 Resistant to quinolones; intermediate susceptibility to linezolid.  Low suspicion for active disease. - Traditionally, would avoid ICS but given his ongoing symptoms of shortness of breath, okay to trial and if no improvement return to using Stiolto -PLEASE AVOID MONOTHERAPY WITH MACROLIDES DUE TO RISK OF INDUCIBLE RESISTANCE OF MAI.  Pulmonary embolus:  -Provoked in setting malignancy.  Xarelto to  continue indefinitely in the setting of malignancy.    Advance care planning: Discussed progressive nature of his underlying cancer.  Considerably hypoxemic, short of breath.  If he could use, he would prefer to die at home.  He would like to be comfortable.  Does not want to be admitted to the hospital.  Does not want life support resuscitative measures etc.  Recommend a DNR form.  Encouraged him to discuss with his oncologist, will message his oncologist today.  RTC in 2 months with Dr. Judeth HornHunsucker.     Current Outpatient Medications:    albuterol (PROVENTIL) (2.5  MG/3ML) 0.083% nebulizer solution, Take 3 mLs (2.5 mg total) by nebulization every 4 (four) hours as needed for wheezing or shortness of breath., Disp: 75 mL, Rfl: 2   albuterol (VENTOLIN HFA) 108 (90 Base) MCG/ACT inhaler, Inhale 2 puffs into the lungs every 4 (four) hours as needed for wheezing or shortness of breath., Disp: 18 g, Rfl: 11   amoxicillin-clavulanate (AUGMENTIN) 875-125 MG tablet, Take 1 tablet by mouth 2 (two) times daily for 14 days., Disp: 28 tablet, Rfl: 0   Budeson-Glycopyrrol-Formoterol (BREZTRI AEROSPHERE) 160-9-4.8 MCG/ACT AERO, Inhale 2 puffs into the lungs in the morning and at bedtime., Disp: 1 each, Rfl: 3   dextromethorphan-guaiFENesin (MUCINEX DM) 30-600 MG 12hr tablet, Take 1 tablet by mouth 2 (two) times daily as needed for cough., Disp: 40 tablet, Rfl: 0   fluconazole (DIFLUCAN) 100 MG tablet, Take 100 mg by mouth daily., Disp: , Rfl:    folic acid (FOLVITE) 1 MG tablet, TAKE 1 TABLET BY MOUTH EVERY DAY, Disp: 90 tablet, Rfl: 1   HYDROcodone-acetaminophen (NORCO/VICODIN) 5-325 MG tablet, Take 1 tablet by mouth every 12 (twelve) hours as needed for moderate pain., Disp: 45 tablet, Rfl: 0   midodrine (PROAMATINE) 5 MG tablet, Take 1 tablet (5 mg total) by mouth 3 (three) times daily with meals., Disp: 90 tablet, Rfl: 3   Multiple Vitamins-Minerals (MULTI ADULT GUMMIES) CHEW, Chew 1 tablet by mouth in the morning and at bedtime., Disp: , Rfl:    naphazoline-pheniramine (VISINE) 0.025-0.3 % ophthalmic solution, Place 1 drop into both eyes daily as needed for eye irritation., Disp: , Rfl:    pantoprazole (PROTONIX) 40 MG tablet, Take 1 tablet (40 mg total) by mouth daily., Disp: 30 tablet, Rfl: 1   predniSONE (DELTASONE) 10 MG tablet, TAKE 4 TABLETSBY MOUTH DAILY WITH BREAKFAST FOR 5 DAYS, 2 DAILY FOR 5 DAYS, THEN 1 TABLET DAILY WITH BREAKFAST., Disp: 90 tablet, Rfl: 0   tamsulosin (FLOMAX) 0.4 MG CAPS capsule, Take 0.4 mg by mouth at bedtime., Disp: , Rfl:    XARELTO 20 MG  TABS tablet, TAKE 1 TABLET BY MOUTH DAILY WITH SUPPER., Disp: 30 tablet, Rfl: 11  I spent 45 minutes in the care of the patient including face-to-face visit, review of records, coordination of care  Karren BurlyMatthew R Quasean Frye,  MD [facility] 05/13/2022 2:56 PM

## 2022-05-13 NOTE — Telephone Encounter (Signed)
Refill requested

## 2022-05-13 NOTE — Patient Instructions (Signed)
Next you begin  Take Augmentin 1 tablet twice a day for 14 days to see if this helps  Continue prednisone 10 mg daily we can decrease this in the future but will hold everything the same for now  I will send new orders for an Oxymizer nasal cannula, and nonrebreather mask, a second oxygen concentrator, and sequential compression device (SCD)  I will send a message to Dr. Shirline Frees  Return to clinic in 2 months or sooner as needed with Dr. Judeth Horn

## 2022-05-14 ENCOUNTER — Inpatient Hospital Stay: Payer: No Typology Code available for payment source

## 2022-05-14 ENCOUNTER — Inpatient Hospital Stay: Payer: No Typology Code available for payment source | Admitting: Internal Medicine

## 2022-05-18 ENCOUNTER — Other Ambulatory Visit: Payer: Self-pay | Admitting: Internal Medicine

## 2022-05-18 ENCOUNTER — Other Ambulatory Visit: Payer: Self-pay | Admitting: Acute Care

## 2022-05-18 DIAGNOSIS — I959 Hypotension, unspecified: Secondary | ICD-10-CM

## 2022-05-27 NOTE — Progress Notes (Unsigned)
Palliative Medicine [facility]  [phone] [phone]   Name: Philip [name] Date: 05/27/2022 MRN: [id number]  DOB: June 02, 1955  Patient Care Team: Farris Has, MD as PCP - General (Family Medicine) Syliva Overman, RN as Oncology Nurse Navigator Thomasene Ripple, RN as Case Manager (General Practice) Pickenpack-Cousar, Arty Baumgartner, NP as Nurse Practitioner (Nurse Practitioner)   I connected with [name] on 05/27/22 at  9:00 AM EDT by phone and verified that I am speaking with the correct person using two identifiers.   I discussed the limitations, risks, security and privacy concerns of performing an evaluation and management service by telemedicine and the availability of in-person appointments. I also discussed with the patient that there may be a patient responsible charge related to this service. The patient expressed understanding and agreed to proceed.   Other persons participating in the visit and their role in the encounter: n/a   Patient's location: home   Provider's location: [facility]   Chief Complaint: F/U of symptom management   INTERVAL HISTORY: Philip [name] is a 67 y.o. male with oncologic medical history including recurrent non-small cell lung cancer (01/2019 then again 12/2020). Past medical history also includes COPD, BPH, tobacco use, PE, and GERD. Palliative ask to see for symptom management and goals of care.   SOCIAL HISTORY:     reports that he quit smoking about 5 months ago. His smoking use included cigarettes. He started smoking about 51 years ago. He has a 90.00 pack-year smoking history. He has quit using smokeless tobacco.  His smokeless tobacco use included chew. He reports current alcohol use. He reports that he does not currently use drugs after having used the following drugs: Marijuana.  ADVANCE DIRECTIVES:  None on file   CODE STATUS: Full code  PAST MEDICAL HISTORY: Past Medical History:   Diagnosis Date   COPD (chronic obstructive pulmonary disease)    Dyspnea    GERD (gastroesophageal reflux disease)    Headache    migraines   Hyperlipidemia    lung ca dx'd 12/2018   On home oxygen therapy    3 to 10 L   Pneumonia    Pulmonary embolus    Tobacco abuse     ALLERGIES:  is allergic to carboplatin, klonopin [clonazepam], and norco [hydrocodone-acetaminophen].  MEDICATIONS:  Current Outpatient Medications  Medication Sig Dispense Refill   albuterol (PROVENTIL) (2.5 MG/3ML) 0.083% nebulizer solution Take 3 mLs (2.5 mg total) by nebulization every 4 (four) hours as needed for wheezing or shortness of breath. 75 mL 2   albuterol (VENTOLIN HFA) 108 (90 Base) MCG/ACT inhaler Inhale 2 puffs into the lungs every 4 (four) hours as needed for wheezing or shortness of breath. 18 g 11   amoxicillin-clavulanate (AUGMENTIN) 875-125 MG tablet Take 1 tablet by mouth 2 (two) times daily for 14 days. 28 tablet 0   Budeson-Glycopyrrol-Formoterol (BREZTRI AEROSPHERE) 160-9-4.8 MCG/ACT AERO Inhale 2 puffs into the lungs in the morning and at bedtime. 1 each 3   dextromethorphan-guaiFENesin (MUCINEX DM) 30-600 MG 12hr tablet Take 1 tablet by mouth 2 (two) times daily as needed for cough. 40 tablet 0   folic acid (FOLVITE) 1 MG tablet TAKE 1 TABLET BY MOUTH EVERY DAY 90 tablet 1   HYDROcodone-acetaminophen (NORCO/VICODIN) 5-325 MG tablet Take 1 tablet by mouth every 12 (twelve) hours as needed for moderate pain. 45 tablet 0   midodrine (PROAMATINE) 5 MG tablet TAKE 1 TABLET (5 MG TOTAL) BY  MOUTH 3 (THREE) TIMES DAILY WITH MEALS. 270 tablet 1   Multiple Vitamins-Minerals (MULTI ADULT GUMMIES) CHEW Chew 1 tablet by mouth in the morning and at bedtime.     naphazoline-pheniramine (VISINE) 0.025-0.3 % ophthalmic solution Place 1 drop into both eyes daily as needed for eye irritation.     pantoprazole (PROTONIX) 40 MG tablet Take 1 tablet (40 mg total) by mouth daily. 30 tablet 1   predniSONE  (DELTASONE) 10 MG tablet TAKE 4 TABLETSBY MOUTH DAILY WITH BREAKFAST FOR 5 DAYS, 2 DAILY FOR 5 DAYS, THEN 1 TABLET DAILY WITH BREAKFAST. 90 tablet 0   tamsulosin (FLOMAX) 0.4 MG CAPS capsule Take 0.4 mg by mouth at bedtime.     XARELTO 20 MG TABS tablet TAKE 1 TABLET BY MOUTH DAILY WITH SUPPER. 30 tablet 11   No current facility-administered medications for this visit.    VITAL SIGNS: There were no vitals taken for this visit. There were no vitals filed for this visit.  Estimated body mass index is 32.12 kg/m as calculated from the following:   Height as of 03/25/22:  (1.676 m).   Weight as of 05/13/22: 199 lb (90.3 kg).   PERFORMANCE STATUS (ECOG) : 1 - Symptomatic but completely ambulatory   Physical Exam General: NAD, in wheelchair  Cardiovascular: regular rate and rhythm Pulmonary: normal breathing pattern, oxygen in place  Abdomen: soft, nontender, + bowel sounds Extremities: no edema, no joint deformities Skin: no rashes Neurological: AAO x4  IMPRESSION:   Neoplasm related pain [name] reports his pain is better controlled. He has been able to sleep throughout the night. Is as active as much as possible.    He is taking Hydrocodone 5/325mg  as needed. Does not require around the clock. Averages once daily depending on level of pain.   Education provided on use of stool softeners if he is not having daily bowel movements. Aware if more than 2 days would need to make part of his daily regimen.   PLAN:  Hydrocodone 5/325 mg one tablet every 12 hours as needed for moderate to severe pain.  Pain contract completed on 2/7 Bowel regimen  Smoking cessation Ongoing goals of care and symptom management support.  I will plan to see patient back in 2-4 weeks in collaboration to other oncology appointments.    Patient expressed understanding and was in agreement with this plan. He also understands that He can call the clinic at any time with any questions, concerns, or  complaints.     Any controlled substances utilized were prescribed in the context of palliative care. PDMP has been reviewed.    Time Total: 20 min   Visit consisted of counseling and education dealing with the complex and emotionally intense issues of symptom management and palliative care in the setting of serious and potentially life-threatening illness.Greater than 50%  of this time was spent counseling and coordinating care related to the above assessment and plan.  Willette Alma, AGPCNP-BC  Palliative Medicine Team/[facility]

## 2022-05-28 ENCOUNTER — Inpatient Hospital Stay: Payer: No Typology Code available for payment source | Attending: Internal Medicine | Admitting: Nurse Practitioner

## 2022-05-28 ENCOUNTER — Encounter: Payer: Self-pay | Admitting: Nurse Practitioner

## 2022-05-28 DIAGNOSIS — R530 Neoplastic (malignant) related fatigue: Secondary | ICD-10-CM

## 2022-05-28 DIAGNOSIS — Z515 Encounter for palliative care: Secondary | ICD-10-CM

## 2022-05-28 DIAGNOSIS — G893 Neoplasm related pain (acute) (chronic): Secondary | ICD-10-CM

## 2022-06-01 ENCOUNTER — Encounter: Payer: Self-pay | Admitting: Internal Medicine

## 2022-06-01 ENCOUNTER — Encounter: Payer: Self-pay | Admitting: Physician Assistant

## 2022-06-04 ENCOUNTER — Other Ambulatory Visit: Payer: No Typology Code available for payment source

## 2022-06-04 ENCOUNTER — Ambulatory Visit: Payer: No Typology Code available for payment source

## 2022-06-04 ENCOUNTER — Ambulatory Visit: Payer: No Typology Code available for payment source | Admitting: Internal Medicine

## 2022-06-12 ENCOUNTER — Encounter: Payer: Self-pay | Admitting: Internal Medicine

## 2022-06-12 ENCOUNTER — Encounter: Payer: Self-pay | Admitting: Physician Assistant

## 2022-06-16 ENCOUNTER — Telehealth: Payer: Self-pay | Admitting: *Deleted

## 2022-06-16 NOTE — Telephone Encounter (Signed)
1642:  Received form [id number] from Evorn Gong with [employer].

## 2022-06-16 NOTE — Telephone Encounter (Signed)
Voicemail received from "Philip [name] with [employer] [phone]) calling to ask what we need to do to receive an updated leave of absence for our employee, [name] already had filled out that ended in March".  Connected with Philip [name] who says he faxed a new form to [phone] after she left message.  Requested return call if not received to resend.  Zero faxes noted on [employer] at this time.

## 2022-06-17 ENCOUNTER — Other Ambulatory Visit: Payer: Self-pay | Admitting: Pulmonary Disease

## 2022-06-17 DIAGNOSIS — I2690 Septic pulmonary embolism without acute cor pulmonale: Secondary | ICD-10-CM

## 2022-06-19 ENCOUNTER — Inpatient Hospital Stay: Payer: No Typology Code available for payment source | Attending: Internal Medicine

## 2022-06-19 ENCOUNTER — Ambulatory Visit (HOSPITAL_COMMUNITY)
Admission: RE | Admit: 2022-06-19 | Discharge: 2022-06-19 | Disposition: A | Discharge status: Home / Self Care | Payer: No Typology Code available for payment source | Source: Ambulatory Visit | Attending: Internal Medicine | Admitting: Internal Medicine

## 2022-06-19 DIAGNOSIS — C3490 Malignant neoplasm of unspecified part of unspecified bronchus or lung: Secondary | ICD-10-CM | POA: Diagnosis present

## 2022-06-19 DIAGNOSIS — C3491 Malignant neoplasm of unspecified part of right bronchus or lung: Secondary | ICD-10-CM

## 2022-06-19 LAB — CMP (CANCER CENTER ONLY)
ALT: 12 U/L (ref 0–44)
AST: 16 U/L (ref 15–41)
Albumin: 3.7 g/dL (ref 3.5–5.0)
Alkaline Phosphatase: 80 U/L (ref 38–126)
Anion gap: 7 (ref 5–15)
BUN: 15 mg/dL (ref 8–23)
CO2: 36 mmol/L — ABNORMAL HIGH (ref 22–32)
Calcium: 9.5 mg/dL (ref 8.9–10.3)
Chloride: 98 mmol/L (ref 98–111)
Creatinine: 0.97 mg/dL (ref 0.61–1.24)
GFR, Estimated: >60 mL/min (ref >60)
Glucose, Bld: 127 mg/dL — ABNORMAL HIGH (ref 70–99)
Potassium: 4.1 mmol/L (ref 3.5–5.1)
Sodium: 141 mmol/L (ref 135–145)
Total Bilirubin: 0.4 mg/dL (ref 0.3–1.2)
Total Protein: 7.5 g/dL (ref 6.5–8.1)

## 2022-06-19 LAB — CBC WITH DIFFERENTIAL (CANCER CENTER ONLY)
Abs Immature Granulocytes: 0.07 10*3/uL (ref 0.00–0.07)
Basophils Absolute: 0.1 10*3/uL (ref 0.0–0.1)
Basophils Relative: 1 %
Eosinophils Absolute: 0.2 10*3/uL (ref 0.0–0.5)
Eosinophils Relative: 2 %
HCT: 43 % (ref 39.0–52.0)
Hemoglobin: 13.6 g/dL (ref 13.0–17.0)
Immature Granulocytes: 1 %
Lymphocytes Relative: 9 %
Lymphs Abs: 0.9 10*3/uL (ref 0.7–4.0)
MCH: 30.1 pg (ref 26.0–34.0)
MCHC: 31.6 g/dL (ref 30.0–36.0)
MCV: 95.1 fL (ref 80.0–100.0)
Monocytes Absolute: 0.7 10*3/uL (ref 0.1–1.0)
Monocytes Relative: 7 %
Neutro Abs: 8.2 10*3/uL — ABNORMAL HIGH (ref 1.7–7.7)
Neutrophils Relative %: 80 %
Platelet Count: 272 10*3/uL (ref 150–400)
RBC: 4.52 MIL/uL (ref 4.22–5.81)
RDW: 14 % (ref 11.5–15.5)
WBC Count: 9.9 10*3/uL (ref 4.0–10.5)
nRBC: 0 % (ref 0.0–0.2)

## 2022-06-19 MED ORDER — SODIUM CHLORIDE (PF) 0.9 % IJ SOLN
INTRAMUSCULAR | Status: AC
  Filled 2022-06-19: qty 50

## 2022-06-19 MED ORDER — IOHEXOL 300 MG/ML  SOLN
75.0000 mL | Freq: Once | INTRAMUSCULAR | Status: AC | PRN
  Administered 2022-06-19: 75 mL via INTRAVENOUS

## 2022-06-19 NOTE — Progress Notes (Deleted)
Palliative Medicine [facility]  [phone] [phone]   Name: Philip [name] Date: 06/19/2022 MRN: [id number]  DOB: 03-Jul-1955  Patient Care Team: Farris Has, MD as PCP - General (Family Medicine) Syliva Overman, RN as Oncology Nurse Navigator Thomasene Ripple, RN as Case Manager (General Practice) Pickenpack-Cousar, Arty Baumgartner, NP as Nurse Practitioner (Nurse Practitioner)   INTERVAL HISTORY: Philip [name] is a 67 y.o. male with oncologic medical history including recurrent non-small cell lung cancer (01/2019 then again 12/2020). Past medical history also includes COPD, BPH, tobacco use, PE, and GERD. Palliative ask to see for symptom management and goals of care.   SOCIAL HISTORY:     reports that he quit smoking about 6 months ago. His smoking use included cigarettes. He started smoking about 51 years ago. He has a 90.00 pack-year smoking history. He has quit using smokeless tobacco.  His smokeless tobacco use included chew. He reports current alcohol use. He reports that he does not currently use drugs after having used the following drugs: Marijuana.  ADVANCE DIRECTIVES:  None on file   CODE STATUS: Full code  PAST MEDICAL HISTORY: Past Medical History:  Diagnosis Date   COPD (chronic obstructive pulmonary disease) (HCC)    Dyspnea    GERD (gastroesophageal reflux disease)    Headache    migraines   Hyperlipidemia    lung ca dx'd 12/2018   On home oxygen therapy    3 to 10 L   Pneumonia    Pulmonary embolus (HCC)    Tobacco abuse     ALLERGIES:  is allergic to carboplatin, klonopin [clonazepam], and norco [hydrocodone-acetaminophen].  MEDICATIONS:  Current Outpatient Medications  Medication Sig Dispense Refill   albuterol (PROVENTIL) (2.5 MG/3ML) 0.083% nebulizer solution Take 3 mLs (2.5 mg total) by nebulization every 4 (four) hours as needed for wheezing or shortness of breath. 75 mL 2   albuterol (VENTOLIN HFA) 108  (90 Base) MCG/ACT inhaler Inhale 2 puffs into the lungs every 4 (four) hours as needed for wheezing or shortness of breath. 18 g 11   Budeson-Glycopyrrol-Formoterol (BREZTRI AEROSPHERE) 160-9-4.8 MCG/ACT AERO Inhale 2 puffs into the lungs in the morning and at bedtime. 1 each 3   dextromethorphan-guaiFENesin (MUCINEX DM) 30-600 MG 12hr tablet Take 1 tablet by mouth 2 (two) times daily as needed for cough. 40 tablet 0   folic acid (FOLVITE) 1 MG tablet TAKE 1 TABLET BY MOUTH EVERY DAY 90 tablet 1   HYDROcodone-acetaminophen (NORCO/VICODIN) 5-325 MG tablet Take 1 tablet by mouth every 12 (twelve) hours as needed for moderate pain. 45 tablet 0   midodrine (PROAMATINE) 5 MG tablet TAKE 1 TABLET (5 MG TOTAL) BY MOUTH 3 (THREE) TIMES DAILY WITH MEALS. 270 tablet 1   Multiple Vitamins-Minerals (MULTI ADULT GUMMIES) CHEW Chew 1 tablet by mouth in the morning and at bedtime.     naphazoline-pheniramine (VISINE) 0.025-0.3 % ophthalmic solution Place 1 drop into both eyes daily as needed for eye irritation.     pantoprazole (PROTONIX) 40 MG tablet Take 1 tablet (40 mg total) by mouth daily. 30 tablet 1   predniSONE (DELTASONE) 10 MG tablet TAKE 4 TABLETSBY MOUTH DAILY WITH BREAKFAST FOR 5 DAYS, 2 DAILY FOR 5 DAYS, THEN 1 TABLET DAILY WITH BREAKFAST. 90 tablet 0   tamsulosin (FLOMAX) 0.4 MG CAPS capsule Take 0.4 mg by mouth at bedtime.     XARELTO 20 MG TABS tablet TAKE 1 TABLET BY MOUTH DAILY WITH SUPPER.  30 tablet 11   No current facility-administered medications for this visit.    VITAL SIGNS: There were no vitals taken for this visit. There were no vitals filed for this visit.  Estimated body mass index is 32.12 kg/m as calculated from the following:   Height as of 03/25/22: 5\' 6"  (1.676 m).   Weight as of 05/13/22: 199 lb (90.3 kg).   PERFORMANCE STATUS (ECOG) : 1 - Symptomatic but completely ambulatory   IMPRESSION:   Neoplasm related pain Philip [name] reports his pain is well controlled.    He is taking Hydrocodone 5/325mg  as needed. Does not require around the clock. Averages once daily depending on level of pain.   Education provided on use of stool softeners if he is not having daily bowel movements. Aware if more than 2 days would need to make part of his daily regimen.   PLAN:  Hydrocodone 5/325 mg one tablet every 12 hours as needed for moderate to severe pain.  Pain contract completed on 2/7 Bowel regimen  Smoking cessation Ongoing goals of care and symptom management support.  I will plan to see patient back in 3-4 weeks in collaboration to other oncology appointments.    Patient expressed understanding and was in agreement with this plan. He also understands that He can call the clinic at any time with any questions, concerns, or complaints.   Any controlled substances utilized were prescribed in the context of palliative care. PDMP has been reviewed.    Visit consisted of counseling and education dealing with the complex and emotionally intense issues of symptom management and palliative care in the setting of serious and potentially life-threatening illness.Greater than 50%  of this time was spent counseling and coordinating care related to the above assessment and plan.  Willette Alma, AGPCNP-BC  Palliative Medicine Team/[facility]  *Please note that this is a verbal dictation therefore any spelling or grammatical errors are due to the "[employer]" system interpretation.

## 2022-06-23 ENCOUNTER — Inpatient Hospital Stay: Payer: No Typology Code available for payment source | Admitting: Internal Medicine

## 2022-06-23 ENCOUNTER — Inpatient Hospital Stay: Payer: No Typology Code available for payment source

## 2022-06-23 ENCOUNTER — Inpatient Hospital Stay: Payer: No Typology Code available for payment source | Admitting: Nurse Practitioner

## 2022-06-23 ENCOUNTER — Other Ambulatory Visit: Payer: No Typology Code available for payment source

## 2022-06-23 ENCOUNTER — Telehealth: Payer: Self-pay | Admitting: Medical Oncology

## 2022-06-23 NOTE — Telephone Encounter (Signed)
LVM to return my call. He did not come to appt at 9 am today.

## 2022-06-24 ENCOUNTER — Telehealth: Payer: Self-pay | Admitting: Pulmonary Disease

## 2022-06-24 DIAGNOSIS — C3490 Malignant neoplasm of unspecified part of unspecified bronchus or lung: Secondary | ICD-10-CM

## 2022-06-24 DIAGNOSIS — R1013 Epigastric pain: Secondary | ICD-10-CM

## 2022-06-24 NOTE — Telephone Encounter (Signed)
Patient states needs refill for Pantoprazole. Pharmacy is [facility]. Also checking on surgical clearance. Patient phone number is [phone].

## 2022-06-25 ENCOUNTER — Inpatient Hospital Stay (HOSPITAL_BASED_OUTPATIENT_CLINIC_OR_DEPARTMENT_OTHER): Payer: No Typology Code available for payment source | Admitting: Physician Assistant

## 2022-06-25 ENCOUNTER — Inpatient Hospital Stay: Payer: No Typology Code available for payment source

## 2022-06-25 VITALS — BP 132/90 | HR 99 | Temp 97.4°F | Resp 19 | Ht 66.0 in | Wt 202.2 lb

## 2022-06-25 DIAGNOSIS — Z7901 Long term (current) use of anticoagulants: Secondary | ICD-10-CM | POA: Diagnosis not present

## 2022-06-25 DIAGNOSIS — Z86711 Personal history of pulmonary embolism: Secondary | ICD-10-CM | POA: Diagnosis not present

## 2022-06-25 DIAGNOSIS — Z7189 Other specified counseling: Secondary | ICD-10-CM

## 2022-06-25 DIAGNOSIS — C3491 Malignant neoplasm of unspecified part of right bronchus or lung: Secondary | ICD-10-CM | POA: Diagnosis not present

## 2022-06-25 DIAGNOSIS — C3490 Malignant neoplasm of unspecified part of unspecified bronchus or lung: Secondary | ICD-10-CM

## 2022-06-25 DIAGNOSIS — Z923 Personal history of irradiation: Secondary | ICD-10-CM | POA: Diagnosis not present

## 2022-06-25 DIAGNOSIS — Z7952 Long term (current) use of systemic steroids: Secondary | ICD-10-CM | POA: Diagnosis not present

## 2022-06-25 DIAGNOSIS — Z9221 Personal history of antineoplastic chemotherapy: Secondary | ICD-10-CM | POA: Diagnosis not present

## 2022-06-25 LAB — CBC WITH DIFFERENTIAL (CANCER CENTER ONLY)
Abs Immature Granulocytes: 0.08 10*3/uL — ABNORMAL HIGH (ref 0.00–0.07)
Basophils Absolute: 0.1 10*3/uL (ref 0.0–0.1)
Basophils Relative: 1 %
Eosinophils Absolute: 0.1 10*3/uL (ref 0.0–0.5)
Eosinophils Relative: 1 %
HCT: 42.5 % (ref 39.0–52.0)
Hemoglobin: 13.3 g/dL (ref 13.0–17.0)
Immature Granulocytes: 1 %
Lymphocytes Relative: 10 %
Lymphs Abs: 0.9 10*3/uL (ref 0.7–4.0)
MCH: 29.8 pg (ref 26.0–34.0)
MCHC: 31.3 g/dL (ref 30.0–36.0)
MCV: 95.1 fL (ref 80.0–100.0)
Monocytes Absolute: 0.7 10*3/uL (ref 0.1–1.0)
Monocytes Relative: 9 %
Neutro Abs: 6.5 10*3/uL (ref 1.7–7.7)
Neutrophils Relative %: 78 %
Platelet Count: 230 10*3/uL (ref 150–400)
RBC: 4.47 MIL/uL (ref 4.22–5.81)
RDW: 14.1 % (ref 11.5–15.5)
WBC Count: 8.3 10*3/uL (ref 4.0–10.5)
nRBC: 0 % (ref 0.0–0.2)

## 2022-06-25 LAB — CMP (CANCER CENTER ONLY)
ALT: 9 U/L (ref 0–44)
AST: 15 U/L (ref 15–41)
Albumin: 3.7 g/dL (ref 3.5–5.0)
Alkaline Phosphatase: 68 U/L (ref 38–126)
Anion gap: 5 (ref 5–15)
BUN: 19 mg/dL (ref 8–23)
CO2: 35 mmol/L — ABNORMAL HIGH (ref 22–32)
Calcium: 9.4 mg/dL (ref 8.9–10.3)
Chloride: 100 mmol/L (ref 98–111)
Creatinine: 0.98 mg/dL (ref 0.61–1.24)
GFR, Estimated: >60 mL/min (ref >60)
Glucose, Bld: 99 mg/dL (ref 70–99)
Potassium: 4.4 mmol/L (ref 3.5–5.1)
Sodium: 140 mmol/L (ref 135–145)
Total Bilirubin: 0.3 mg/dL (ref 0.3–1.2)
Total Protein: 7.2 g/dL (ref 6.5–8.1)

## 2022-06-25 MED ORDER — PANTOPRAZOLE SODIUM 40 MG PO TBEC
40.0000 mg | DELAYED_RELEASE_TABLET | Freq: Every day | ORAL | 3 refills | Status: DC
Start: 2022-06-25 — End: 2022-07-02

## 2022-06-25 NOTE — Progress Notes (Signed)
[facility] OFFICE PROGRESS NOTE  Farris Has, MD [address] [place] [address]  DIAGNOSIS:  1) recurrent non-small cell lung cancer initially diagnosed as stage IIIc (T4, N3, M0) non-small cell lung cancer, adenocarcinoma presented with large right hilar mass in addition to bilateral hilar and mediastinal lymphadenopathy diagnosed in December 2020.  The patient has evidence for disease recurrence in October 2022. 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  Started on Xarelto on March 17, 2020 and currently 20 mg p.o. daily.  PRIOR THERAPY: 1) Weekly concurrent chemoradiation with carboplatin for an AUC of 2 and paclitaxel 45 mg/m2. First dose starting 01/30/2019.   Status post 6 cycles. 2) Consolidation immunotherapy with Imfinzi 1500 mg IV every 4 weeks. First dose expected on 04/19/2019.  Status post 13 cycles. 3) disease recurrence in October 2022.  CURRENT THERAPY: Systemic chemotherapy with carboplatin for AUC of 5, Alimta 500 Mg/M2 and Keytruda 200 Mg IV every 3 weeks.  First dose January 01, 2021.  Status post 21 cycles.  He had hypersensitivity reaction to carboplatin and this was discontinued after cycle #2.  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 Treatment has been on hold since 04/02/22 due to other health conditions.   INTERVAL HISTORY: Philip [name] CURRENT 67 y.o. male returns to the clinic today for a follow-up visit accompanied by his wife.  He was last seen by Dr. Arbutus Ped on 04/23/2022.  In summary, the patient was hospitalized for pneumonia/possible pneumonitis in December 2023.  He had chronic hypoxemia respiratory failure, significant emphysema, radiation fibrosis, and lung cancer.  He is on supplemental oxygen and had been on prednisone.  He follows closely with Dr. Judeth Horn from  pulmonary medicine who does not feel that his condition will improve.  When the patient last saw Dr. Judeth Horn on 05/13/2022, his note mentions that he is requiring 8 L of oxygen at rest and higher with exertion.  He anticipates that this will continue to worsen he also mention the progressive nature of his disease. He referred him to pulmonary rehab but it appears the referral was closed as the patient had not called back.   With Dr. Arbutus Ped last saw the patient, because of the patient's poor overall health and stable malignancy, Dr. Arbutus Ped opted to give him a 35-month break from treatment.   Since being off treatment, the patient is feeling fair.  Today, he denies any fever, chills, or night sweats. He is still on prednisone 10 mg p.o. daily. He continues to have significant shortness of breath for which she is on 6-8 L liters of oxygen, more depending on the activity. He denies significant cough.  Denies any hemoptysis or chest pain.  Denies any nausea, vomiting, diarrhea, or constipation unless he is coughing a lot he may have emesis.  Denies any headache or visual changes.  Denies any weight loss or night sweats he has been following closely with palliative care for pain control which he is prescribed Norco. He recently had a restaging CT scan. He is here for evaluation and to review his scan    MEDICAL HISTORY: Past Medical History:  Diagnosis Date   COPD (chronic obstructive pulmonary disease) (HCC)    Dyspnea    GERD (gastroesophageal reflux disease)    Headache    migraines   Hyperlipidemia    lung ca dx'd  12/2018   On home oxygen therapy    3 to 10 L   Pneumonia    Pulmonary embolus (HCC)    Tobacco abuse     ALLERGIES:  is allergic to carboplatin, klonopin [clonazepam], and norco [hydrocodone-acetaminophen].  MEDICATIONS:  Current Outpatient Medications  Medication Sig Dispense Refill   albuterol (PROVENTIL) (2.5 MG/3ML) 0.083% nebulizer solution Take 3 mLs (2.5 mg total) by  nebulization every 4 (four) hours as needed for wheezing or shortness of breath. 75 mL 2   albuterol (VENTOLIN HFA) 108 (90 Base) MCG/ACT inhaler Inhale 2 puffs into the lungs every 4 (four) hours as needed for wheezing or shortness of breath. 18 g 11   Budeson-Glycopyrrol-Formoterol (BREZTRI AEROSPHERE) 160-9-4.8 MCG/ACT AERO Inhale 2 puffs into the lungs in the morning and at bedtime. 1 each 3   dextromethorphan-guaiFENesin (MUCINEX DM) 30-600 MG 12hr tablet Take 1 tablet by mouth 2 (two) times daily as needed for cough. 40 tablet 0   folic acid (FOLVITE) 1 MG tablet TAKE 1 TABLET BY MOUTH EVERY DAY 90 tablet 1   HYDROcodone-acetaminophen (NORCO/VICODIN) 5-325 MG tablet Take 1 tablet by mouth every 12 (twelve) hours as needed for moderate pain. 45 tablet 0   midodrine (PROAMATINE) 5 MG tablet TAKE 1 TABLET (5 MG TOTAL) BY MOUTH 3 (THREE) TIMES DAILY WITH MEALS. 270 tablet 1   Multiple Vitamins-Minerals (MULTI ADULT GUMMIES) CHEW Chew 1 tablet by mouth in the morning and at bedtime.     naphazoline-pheniramine (VISINE) 0.025-0.3 % ophthalmic solution Place 1 drop into both eyes daily as needed for eye irritation.     pantoprazole (PROTONIX) 40 MG tablet Take 1 tablet (40 mg total) by mouth daily. 30 tablet 1   predniSONE (DELTASONE) 10 MG tablet TAKE 4 TABLETSBY MOUTH DAILY WITH BREAKFAST FOR 5 DAYS, 2 DAILY FOR 5 DAYS, THEN 1 TABLET DAILY WITH BREAKFAST. 90 tablet 0   tamsulosin (FLOMAX) 0.4 MG CAPS capsule Take 0.4 mg by mouth at bedtime.     XARELTO 20 MG TABS tablet TAKE 1 TABLET BY MOUTH DAILY WITH SUPPER. 30 tablet 11   No current facility-administered medications for this visit.    SURGICAL HISTORY:  Past Surgical History:  Procedure Laterality Date   COLONOSCOPY     CYSTOSCOPY W/ URETERAL STENT PLACEMENT Right 10/24/2021   Procedure: CYSTOSCOPY WITH RETROGRADE PYELOGRAM/URETERAL STENT PLACEMENT;  Surgeon: Jannifer Hick, MD;  Location: [facility];  Service: Urology;  Laterality: Right;    HAND SURGERY     VASECTOMY     VIDEO BRONCHOSCOPY WITH ENDOBRONCHIAL ULTRASOUND N/A 12/22/2018   Procedure: VIDEO BRONCHOSCOPY WITH ENDOBRONCHIAL ULTRASOUND;  Surgeon: Josephine Igo, DO;  Location: [facility];  Service: Thoracic;  Laterality: N/A;   VIDEO BRONCHOSCOPY WITH ENDOBRONCHIAL ULTRASOUND N/A 01/09/2019   Procedure: VIDEO BRONCHOSCOPY WITH ENDOBRONCHIAL ULTRASOUND WITH FLUORO;  Surgeon: Josephine Igo, DO;  Location: [facility];  Service: Thoracic;  Laterality: N/A;   VIDEO BRONCHOSCOPY WITH RADIAL ENDOBRONCHIAL ULTRASOUND N/A 12/22/2018   Procedure: RADIAL ENDOBRONCHIAL ULTRASOUND;  Surgeon: Josephine Igo, DO;  Location: [facility];  Service: Thoracic;  Laterality: N/A;    REVIEW OF SYSTEMS:   Review of Systems  Constitutional: Negative for appetite change, chills, fatigue, fever and unexpected weight change.  HENT:   Negative for mouth sores, nosebleeds, sore throat and trouble swallowing.   Eyes: Negative for eye problems and icterus.  Respiratory: Positive for shortness of breath. Negative for cough, hemoptysis, and wheezing.     Cardiovascular: Negative for  chest pain and leg swelling.  Gastrointestinal: Negative for abdominal pain, constipation, diarrhea, nausea and vomiting.  Genitourinary: Negative for bladder incontinence, difficulty urinating, dysuria, frequency and hematuria.   Musculoskeletal: Negative for back pain, gait problem, neck pain and neck stiffness.  Skin: Negative for itching and rash.  Neurological: Negative for dizziness, extremity weakness, gait problem, headaches, light-headedness and seizures.  Hematological: Negative for adenopathy. Does not bruise/bleed easily.  Psychiatric/Behavioral: Negative for confusion, depression and sleep disturbance. The patient is not nervous/anxious.     PHYSICAL EXAMINATION:  Blood pressure (!) 132/90, pulse 99, temperature (!) 97.4 F (36.3 C), temperature source Oral, resp. rate 19, height 5\' 6"  (1.676 m), weight 202 lb 4 oz  (91.7 kg), SpO2 96 %.  ECOG PERFORMANCE STATUS: 1  Physical Exam  Constitutional: Oriented to person, place, and time and well-developed, well-nourished, and in no distress.  HENT:  Head: Positive for some cushingoid features. Normocephalic and atraumatic.  Mouth/Throat: Oropharynx is clear and moist. No oropharyngeal exudate.  Eyes: Conjunctivae are normal. Right eye exhibits no discharge. Left eye exhibits no discharge. No scleral icterus.  Neck: Normal range of motion. Neck supple.  Cardiovascular: normal rate, regular rhythm, normal heart sounds and intact distal pulses.   Pulmonary/Chest: Effort normal and breath sounds normal. No respiratory distress. No wheezes. No rales. On supplemental oxygen.  Abdominal: Soft. Bowel sounds are normal. Exhibits no distension and no mass. There is no tenderness.  Musculoskeletal: Normal range of motion. Positive for bilateral lower extremity swelling.  Lymphadenopathy:    No cervical adenopathy.  Neurological: Alert and oriented to person, place, and time. Exhibits normal muscle tone. Gait normal. Coordination normal.  Skin: Skin is warm and dry. No rash noted. Not diaphoretic. No erythema. No pallor.  Psychiatric: Mood,  memory and judgment normal.  Vitals reviewed.  LABORATORY DATA: Lab Results  Component Value Date   WBC 8.3 06/25/2022   HGB 13.3 06/25/2022   HCT 42.5 06/25/2022   MCV 95.1 06/25/2022   PLT 230 06/25/2022      Chemistry      Component Value Date/Time   NA 140 06/25/2022 1035   K 4.4 06/25/2022 1035   CL 100 06/25/2022 1035   CO2 35 (H) 06/25/2022 1035   BUN 19 06/25/2022 1035   CREATININE 0.98 06/25/2022 1035      Component Value Date/Time   CALCIUM 9.4 06/25/2022 1035   ALKPHOS 68 06/25/2022 1035   AST 15 06/25/2022 1035   ALT 9 06/25/2022 1035   BILITOT 0.3 06/25/2022 1035       RADIOGRAPHIC STUDIES:  CT Chest W Contrast  Result Date: 06/21/2022 CLINICAL DATA:  67 year old male with history of  non-small cell lung cancer. Follow-up study. EXAM: CT CHEST, ABDOMEN, AND PELVIS WITH CONTRAST TECHNIQUE: Multidetector CT imaging of the chest, abdomen and pelvis was performed following the standard protocol during bolus administration of intravenous contrast. RADIATION DOSE REDUCTION: This exam was performed according to the departmental dose-optimization program which includes automated exposure control, adjustment of the mA and/or kV according to patient size and/or use of iterative reconstruction technique. CONTRAST:  75mL OMNIPAQUE IOHEXOL 300 MG/ML  SOLN COMPARISON:  Multiple prior examinations, most recently chest CT 03/11/2022. Additionally, CT of the chest, abdomen and pelvis 01/07/2022. FINDINGS: CT CHEST FINDINGS Cardiovascular: Heart size is normal. There is no significant pericardial fluid, thickening or pericardial calcification. There is aortic atherosclerosis, as well as atherosclerosis of the great vessels of the mediastinum and the coronary arteries, including calcified atherosclerotic plaque  in the left main, left anterior descending, left circumflex and right coronary arteries. Mediastinum/Nodes: No pathologically enlarged mediastinal or hilar lymph nodes. Esophagus is unremarkable in appearance. No axillary lymphadenopathy. Lungs/Pleura: Extensive areas of mass-like architectural distortion and volume loss are noted in the medial aspects of both lungs, compatible with areas of chronic postradiation mass-like fibrosis, similar to prior examinations. No definite new suspicious appearing pulmonary nodules or masses are noted. No new acute consolidative airspace disease. Small right pleural effusion slightly increased compared to the prior study. No left pleural effusion. Diffuse bronchial wall thickening with moderate centrilobular and paraseptal emphysema. Extensive septal thickening is noted in the lungs bilaterally, most severe in the left lower lobe where there is some subpleural  reticulation, mild ground-glass attenuation and some peripheral bronchiolectasis, concerning for interstitial lung disease, similar to the prior study. Musculoskeletal: There are no aggressive appearing lytic or blastic lesions noted in the visualized portions of the skeleton. CT ABDOMEN PELVIS FINDINGS Hepatobiliary: No suspicious cystic or solid hepatic lesions. No intra or extrahepatic biliary ductal dilatation. Gallbladder is unremarkable in appearance. Pancreas: No pancreatic mass. No pancreatic ductal dilatation. No peripancreatic fluid collections or inflammatory changes. Spleen: Unremarkable. Adrenals/Urinary Tract: 2.6 x 2.2 cm left adrenal nodule, stable compared to prior studies, previously characterized as a benign adenoma (no imaging follow-up recommended). Right adrenal gland and left kidney are normal in appearance. Double-J ureteral stent noted in the right ureter with the proximal loop reformed in the right renal pelvis and distal loop reformed in the urinary bladder. Right kidney is otherwise normal in appearance. No hydroureteronephrosis. Urinary bladder is unremarkable in appearance. Stomach/Bowel: The appearance of the stomach is normal. No pathologic dilatation of small bowel or colon. Normal appendix. Vascular/Lymphatic: No atherosclerotic calcifications are noted in the abdominal aorta or pelvic vasculature. No lymphadenopathy noted in the abdomen or pelvis. Reproductive: Prostate gland and seminal vesicles are unremarkable in appearance. Other: No significant volume of ascites.  No pneumoperitoneum. Musculoskeletal: There are no aggressive appearing lytic or blastic lesions noted in the visualized portions of the skeleton. IMPRESSION: 1. Similar appearance of chronic postradiation changes in the lungs bilaterally, with no definitive findings to suggest locally recurrent disease or definite metastatic disease in the chest, abdomen or pelvis. 2. Slight increased size of small right pleural  effusion. This is nonspecific. Attention on follow-up studies is recommended. 3. Chronic changes in the left lower lobe concerning for potential interstitial lung disease, similar to the prior study. Outpatient referral to Pulmonology could be considered for further clinical evaluation if clinically appropriate. 4. Diffuse bronchial wall thickening with moderate centrilobular and paraseptal emphysema; imaging findings suggestive of underlying COPD. 5. Aortic atherosclerosis, in addition to left main and three-vessel coronary artery disease. Please note that although the presence of coronary artery calcium documents the presence of coronary artery disease, the severity of this disease and any potential stenosis cannot be assessed on this non-gated CT examination. Assessment for potential risk factor modification, dietary therapy or pharmacologic therapy may be warranted, if clinically indicated. 6. Additional incidental findings, as above. Electronically Signed   By: Trudie Reed M.D.   On: 06/21/2022 12:49   CT Abdomen Pelvis W Contrast  Result Date: 06/21/2022 CLINICAL DATA:  67 year old male with history of non-small cell lung cancer. Follow-up study. EXAM: CT CHEST, ABDOMEN, AND PELVIS WITH CONTRAST TECHNIQUE: Multidetector CT imaging of the chest, abdomen and pelvis was performed following the standard protocol during bolus administration of intravenous contrast. RADIATION DOSE REDUCTION: This exam was performed according  to the departmental dose-optimization program which includes automated exposure control, adjustment of the mA and/or kV according to patient size and/or use of iterative reconstruction technique. CONTRAST:  75mL OMNIPAQUE IOHEXOL 300 MG/ML  SOLN COMPARISON:  Multiple prior examinations, most recently chest CT 03/11/2022. Additionally, CT of the chest, abdomen and pelvis 01/07/2022. FINDINGS: CT CHEST FINDINGS Cardiovascular: Heart size is normal. There is no significant pericardial  fluid, thickening or pericardial calcification. There is aortic atherosclerosis, as well as atherosclerosis of the great vessels of the mediastinum and the coronary arteries, including calcified atherosclerotic plaque in the left main, left anterior descending, left circumflex and right coronary arteries. Mediastinum/Nodes: No pathologically enlarged mediastinal or hilar lymph nodes. Esophagus is unremarkable in appearance. No axillary lymphadenopathy. Lungs/Pleura: Extensive areas of mass-like architectural distortion and volume loss are noted in the medial aspects of both lungs, compatible with areas of chronic postradiation mass-like fibrosis, similar to prior examinations. No definite new suspicious appearing pulmonary nodules or masses are noted. No new acute consolidative airspace disease. Small right pleural effusion slightly increased compared to the prior study. No left pleural effusion. Diffuse bronchial wall thickening with moderate centrilobular and paraseptal emphysema. Extensive septal thickening is noted in the lungs bilaterally, most severe in the left lower lobe where there is some subpleural reticulation, mild ground-glass attenuation and some peripheral bronchiolectasis, concerning for interstitial lung disease, similar to the prior study. Musculoskeletal: There are no aggressive appearing lytic or blastic lesions noted in the visualized portions of the skeleton. CT ABDOMEN PELVIS FINDINGS Hepatobiliary: No suspicious cystic or solid hepatic lesions. No intra or extrahepatic biliary ductal dilatation. Gallbladder is unremarkable in appearance. Pancreas: No pancreatic mass. No pancreatic ductal dilatation. No peripancreatic fluid collections or inflammatory changes. Spleen: Unremarkable. Adrenals/Urinary Tract: 2.6 x 2.2 cm left adrenal nodule, stable compared to prior studies, previously characterized as a benign adenoma (no imaging follow-up recommended). Right adrenal gland and left kidney are  normal in appearance. Double-J ureteral stent noted in the right ureter with the proximal loop reformed in the right renal pelvis and distal loop reformed in the urinary bladder. Right kidney is otherwise normal in appearance. No hydroureteronephrosis. Urinary bladder is unremarkable in appearance. Stomach/Bowel: The appearance of the stomach is normal. No pathologic dilatation of small bowel or colon. Normal appendix. Vascular/Lymphatic: No atherosclerotic calcifications are noted in the abdominal aorta or pelvic vasculature. No lymphadenopathy noted in the abdomen or pelvis. Reproductive: Prostate gland and seminal vesicles are unremarkable in appearance. Other: No significant volume of ascites.  No pneumoperitoneum. Musculoskeletal: There are no aggressive appearing lytic or blastic lesions noted in the visualized portions of the skeleton. IMPRESSION: 1. Similar appearance of chronic postradiation changes in the lungs bilaterally, with no definitive findings to suggest locally recurrent disease or definite metastatic disease in the chest, abdomen or pelvis. 2. Slight increased size of small right pleural effusion. This is nonspecific. Attention on follow-up studies is recommended. 3. Chronic changes in the left lower lobe concerning for potential interstitial lung disease, similar to the prior study. Outpatient referral to Pulmonology could be considered for further clinical evaluation if clinically appropriate. 4. Diffuse bronchial wall thickening with moderate centrilobular and paraseptal emphysema; imaging findings suggestive of underlying COPD. 5. Aortic atherosclerosis, in addition to left main and three-vessel coronary artery disease. Please note that although the presence of coronary artery calcium documents the presence of coronary artery disease, the severity of this disease and any potential stenosis cannot be assessed on this non-gated CT examination. Assessment for potential risk  factor modification,  dietary therapy or pharmacologic therapy may be warranted, if clinically indicated. 6. Additional incidental findings, as above. Electronically Signed   By: Trudie Reed M.D.   On: 06/21/2022 12:49     ASSESSMENT/PLAN:  This is a very pleasant 67 year old Caucasian male initially diagnosed with stage IIIc non-small cell lung cancer, adenocarcinoma.  He was diagnosed in 2020. Molecular studies negative for any actionable mutations.  He was found to have disease recurrence in October 2022. No actionable mutations by guardant 360.    The patient initially completed concurrent chemoradiation with weekly carboplatin for an AUC of 2 and paclitaxel 45 mg per metered squared.  He status post 6 cycles.  He tolerated this well with a partial response.   He then underwent consolidation immunotherapy with Imfinzi 1500 mg IV every 4 weeks.  He status post 13 cycles.    In September 2022, the patient scan showed an increase in the size of the soft tissue nodule in the right middle lobe adjacent to the post radiation changes which is highly suspicious for disease recurrence.  He also has a soft tissue of the right hilum/azygoesophageal recess increased compared to remote prior studies.    The patient a PET scan that showed hypermetabolic nodule in the right lower lobe adjacent to the post radiation treatment site is increased consistent with lung cancer recurrence.  There is also a second hypermetabolic nodule in the right lower lobe above the diaphragm concerning for recurrence and hypermetabolic contralateral lymph node in the high left paratracheal mediastinum also concerning for metastatic adenopathy recurrence.   Dr. Arbutus Ped arranged for the patient to have guardant 360 molecular testing performed which showed no actionable mutations.   Therefore, Dr. Arbutus Ped recommend the patient start palliative systemic chemotherapy with carboplatin for an AUC of 5, Alimta 500 mg per metered square, Keytruda 200 mg IV  every 3 weeks. He is status post 21 cycles of treatment. Starting from cycle #5, the patient started maintenance alimta 500 mg/m2 and Keytruda 200 mg IV every 3 weeks.  Rande Lawman was discontinued starting from cycle #9 due to possible pneumonitis.  His treatment has been held since February 2024 due to poor overall health.  The patient was hospitalized for several weeks in December 2023 for pneumonia and pneumonitis. When the patient was last seen by Dr. Arbutus Ped on 02/05/2022 the patient had a CT angiogram which showed some areas of resolved densities but some new areas which are suspicious for infectious/inflammatory changes.  He then underwent high-dose prednisone taper.  He has been following closely with pulmonary medicine.  He last saw them in April 2024.  Unfortunately, they do not anticipate that his overall condition will improve.  He is on supplemental oxygen. He was referred to pulmonary rehab but the referral was closed due to the patient not calling back. He will call them back to place new referral.   The patient recently had a restaging CT scan performed.  The patient was seen to do [place].  Dr. Arbutus Ped personally independently reviewed the scan discussed results with the patient today.  The scan showed no evidence of disease progression. Of course, he does have significant underlying chronic lung damage related to radiation fibrosis, emphysema, and potential interstitial lung disease.   Dr. Arbutus Ped recommends that the patient continue on observation with a restaging CT scan of the chest in 3 months. We will see him back a few days after his next scan to review the results.   He will continue  follow-up with pulmonary medicine. He will continue on prednisone per pulmonary recommendation.   He is currently on Xarelto for his history of PE.   The patient was advised to call immediately if he has any concerning symptoms in the interval. The patient voices understanding of current disease  status and treatment options and is in agreement with the current care plan. All questions were answered. The patient knows to call the clinic with any problems, questions or concerns. We can certainly see the patient much sooner if necessary      Orders Placed This Encounter  Procedures   CT CHEST ABDOMEN PELVIS W CONTRAST    Standing Status:   Future    Standing Expiration Date:   06/25/2023    Order Specific Question:   If indicated for the ordered procedure, I authorize the administration of contrast media per Radiology protocol    Answer:   Yes    Order Specific Question:   Does the patient have a contrast media/X-ray dye allergy?    Answer:   No    Order Specific Question:   Preferred imaging location?    Answer:   [facility]    Order Specific Question:   If indicated for the ordered procedure, I authorize the administration of oral contrast media per Radiology protocol    Answer:   Yes   CBC with Differential (Cancer Center Only)    Standing Status:   Future    Standing Expiration Date:   06/25/2023   CMP (Cancer Center only)    Standing Status:   Future    Standing Expiration Date:   06/25/2023      Johnette Abraham Shifra Swartzentruber, [name] 06/25/22  ADDENDUM: Hematology/Oncology Attending: I had a face-to-face encounter with the patient today.  I reviewed his record, lab, scan and recommended his care plan.  This is a very pleasant 67 years old white male with recurrent non-small cell lung cancer that was initially diagnosed as a stage IIIc in December 2020 and he has disease recurrence in October 2022.  He also has a history of acute on chronic pulmonary embolism and has been on treatment with Xarelto.  The patient is status post several treatment in the past including concurrent chemoradiation as well as consolidation immunotherapy then systemic chemotherapy with carboplatin, Alimta and Keytruda for 4 cycles followed by maintenance treatment discontinued and February 2024  because of his comorbidities and concern about recurrent infection and immunotherapy mediated pneumonitis. He is currently on observation and continues to complain of the baseline shortness of breath increased with exertion and he is currently on home oxygen. He had repeat CT scan of the chest performed recently.  I personally and independently reviewed the scan images and discussed the result with the patient and his wife today.  His scan showed no concerning findings for disease progression but there are a lot of bilateral fibrotic changes and emphysema responsible for his respiratory condition. I recommended for the patient to continue on observation with close monitoring with pulmonary medicine for his COPD and pulmonary fibrosis. We will see him back for follow-up visit in 3 months for evaluation and repeat CT scan of the chest for restaging of his disease. The patient was advised to call immediately if he has any concerning symptoms in the interval. The total time spent in the appointment was 30 minutes. Disclaimer: This note was dictated with voice recognition software. Similar sounding words can inadvertently be transcribed and may be missed upon review. Velora Heckler  Arbutus Ped, MD

## 2022-06-25 NOTE — Telephone Encounter (Signed)
Pantoprazole has been sent to pharmacy for pt.  Paige, please advise if you have received a surgical clearance form on pt.

## 2022-06-25 NOTE — Telephone Encounter (Signed)
ATC X1 LVM for patient. Need more info on surgery. Not received clearance

## 2022-06-26 ENCOUNTER — Inpatient Hospital Stay: Payer: No Typology Code available for payment source

## 2022-06-26 NOTE — Telephone Encounter (Signed)
Please try again. PT ret your call.

## 2022-07-01 ENCOUNTER — Other Ambulatory Visit: Payer: Self-pay | Admitting: Pulmonary Disease

## 2022-07-01 DIAGNOSIS — C3490 Malignant neoplasm of unspecified part of unspecified bronchus or lung: Secondary | ICD-10-CM

## 2022-07-01 DIAGNOSIS — R1013 Epigastric pain: Secondary | ICD-10-CM

## 2022-07-02 NOTE — Telephone Encounter (Signed)
Patient is having surgery to replace stent in kidney. Surgery is currently pending. Patient can't remember the Doctor performing the surgery office is with [facility]. Dr. Judeth Horn pt has apt scheduled on 6/12. Please provider risk assessment for surgery at that time

## 2022-07-06 ENCOUNTER — Inpatient Hospital Stay: Payer: No Typology Code available for payment source | Attending: Internal Medicine | Admitting: Nurse Practitioner

## 2022-07-06 ENCOUNTER — Encounter: Payer: Self-pay | Admitting: Nurse Practitioner

## 2022-07-06 DIAGNOSIS — C3490 Malignant neoplasm of unspecified part of unspecified bronchus or lung: Secondary | ICD-10-CM | POA: Diagnosis not present

## 2022-07-06 DIAGNOSIS — M792 Neuralgia and neuritis, unspecified: Secondary | ICD-10-CM

## 2022-07-06 DIAGNOSIS — G2581 Restless legs syndrome: Secondary | ICD-10-CM | POA: Diagnosis not present

## 2022-07-06 DIAGNOSIS — Z515 Encounter for palliative care: Secondary | ICD-10-CM

## 2022-07-06 MED ORDER — GABAPENTIN 300 MG PO CAPS
300.0000 mg | ORAL_CAPSULE | Freq: Two times a day (BID) | ORAL | 1 refills | Status: DC
Start: 2022-07-06 — End: 2022-09-30

## 2022-07-06 NOTE — Progress Notes (Signed)
Palliative Medicine [facility]  [phone] [phone]   Name: Philip [name] Date: 07/06/2022 MRN: [id number]  DOB: 1955-10-24  Patient Care Team: Farris Has, MD as PCP - General (Family Medicine) Syliva Overman, RN as Oncology Nurse Navigator Thomasene Ripple, RN as Case Manager (General Practice) Pickenpack-Cousar, Arty Baumgartner, NP as Nurse Practitioner (Nurse Practitioner)   I connected with Philip [name] on 07/06/22 at  2:00 PM EDT by phone and verified that I am speaking with the correct person using two identifiers.   I discussed the limitations, risks, security and privacy concerns of performing an evaluation and management service by telemedicine and the availability of in-person appointments. I also discussed with the patient that there may be a patient responsible charge related to this service. The patient expressed understanding and agreed to proceed.   Other persons participating in the visit and their role in the encounter: n/a   Patient's location: home  Provider's location: [facility]   Chief Complaint: f/u of symptom management   INTERVAL HISTORY: Philip [name] is a 67 y.o. male with oncologic medical history including recurrent non-small cell lung cancer (01/2019 then again 12/2020). Past medical history also includes COPD, BPH, tobacco use, PE, and GERD. Palliative ask to see for symptom management and goals of care.   SOCIAL HISTORY:     reports that he quit smoking about 7 months ago. His smoking use included cigarettes. He started smoking about 52 years ago. He has a 90.00 pack-year smoking history. He has quit using smokeless tobacco.  His smokeless tobacco use included chew. He reports current alcohol use. He reports that he does not currently use drugs after having used the following drugs: Marijuana.  ADVANCE DIRECTIVES:  None on file   CODE STATUS: Full code  PAST MEDICAL HISTORY: Past Medical History:   Diagnosis Date   COPD (chronic obstructive pulmonary disease) (HCC)    Dyspnea    GERD (gastroesophageal reflux disease)    Headache    migraines   Hyperlipidemia    lung ca dx'd 12/2018   On home oxygen therapy    3 to 10 L   Pneumonia    Pulmonary embolus (HCC)    Tobacco abuse     ALLERGIES:  is allergic to carboplatin, klonopin [clonazepam], and norco [hydrocodone-acetaminophen].  MEDICATIONS:  Current Outpatient Medications  Medication Sig Dispense Refill   albuterol (PROVENTIL) (2.5 MG/3ML) 0.083% nebulizer solution Take 3 mLs (2.5 mg total) by nebulization every 4 (four) hours as needed for wheezing or shortness of breath. 75 mL 2   albuterol (VENTOLIN HFA) 108 (90 Base) MCG/ACT inhaler Inhale 2 puffs into the lungs every 4 (four) hours as needed for wheezing or shortness of breath. 18 g 11   Budeson-Glycopyrrol-Formoterol (BREZTRI AEROSPHERE) 160-9-4.8 MCG/ACT AERO Inhale 2 puffs into the lungs in the morning and at bedtime. 1 each 3   dextromethorphan-guaiFENesin (MUCINEX DM) 30-600 MG 12hr tablet Take 1 tablet by mouth 2 (two) times daily as needed for cough. 40 tablet 0   folic acid (FOLVITE) 1 MG tablet TAKE 1 TABLET BY MOUTH EVERY DAY 90 tablet 1   HYDROcodone-acetaminophen (NORCO/VICODIN) 5-325 MG tablet Take 1 tablet by mouth every 12 (twelve) hours as needed for moderate pain. 45 tablet 0   midodrine (PROAMATINE) 5 MG tablet TAKE 1 TABLET (5 MG TOTAL) BY MOUTH 3 (THREE) TIMES DAILY WITH MEALS. 270 tablet 1   Multiple Vitamins-Minerals (MULTI ADULT GUMMIES) CHEW Chew 1 tablet  by mouth in the morning and at bedtime.     naphazoline-pheniramine (VISINE) 0.025-0.3 % ophthalmic solution Place 1 drop into both eyes daily as needed for eye irritation.     pantoprazole (PROTONIX) 40 MG tablet TAKE 1 TABLET BY MOUTH EVERY DAY 90 tablet 1   predniSONE (DELTASONE) 10 MG tablet TAKE 4 TABLETSBY MOUTH DAILY WITH BREAKFAST FOR 5 DAYS, 2 DAILY FOR 5 DAYS, THEN 1 TABLET DAILY WITH  BREAKFAST. 90 tablet 0   tamsulosin (FLOMAX) 0.4 MG CAPS capsule Take 0.4 mg by mouth at bedtime.     XARELTO 20 MG TABS tablet TAKE 1 TABLET BY MOUTH DAILY WITH SUPPER. 30 tablet 11   No current facility-administered medications for this visit.    VITAL SIGNS: There were no vitals taken for this visit. There were no vitals filed for this visit.  Estimated body mass index is 32.64 kg/m as calculated from the following:   Height as of 06/25/22: 5\' 6"  (1.676 m).   Weight as of 06/25/22: 202 lb 4 oz (91.7 kg).   PERFORMANCE STATUS (ECOG) : 1 - Symptomatic but completely ambulatory   IMPRESSION: I connected with Philip [name] and his wife by phone. Overall is doing well. Denies nausea, vomiting, constipation, or diarrhea.   He is reporting some ongoing fatigue and weakness. Somewhat deconditioned. Is asking about options for PT/OT to assist with strengthening. Advised will place referral for home health.   Philip [name] complains of occasional discomfort in his hands and feet which causes him to have trembles at times. No recent falls or injury. We discussed neuropathic regimens including gabapentin. He verbalized understanding and would like to try. Education provided on use and frequency.   We will continue to closely follow and support.    PLAN:  Hydrocodone 5/325 mg one tablet every 12 hours as needed for moderate to severe pain.  Pain contract completed on 2/7 Gabapentin 300mg  twice daily  Ongoing goals of care and symptom management support.  I will plan to see patient back in 3-4 weeks in collaboration to other oncology appointments.    Patient expressed understanding and was in agreement with this plan. He also understands that He can call the clinic at any time with any questions, concerns, or complaints.   Any controlled substances utilized were prescribed in the context of palliative care. PDMP has been reviewed.    Visit consisted of counseling and education dealing with the  complex and emotionally intense issues of symptom management and palliative care in the setting of serious and potentially life-threatening illness.Greater than 50%  of this time was spent counseling and coordinating care related to the above assessment and plan.  Willette Alma, AGPCNP-BC  Palliative Medicine Team/[facility]  *Please note that this is a verbal dictation therefore any spelling or grammatical errors are due to the "[employer]" system interpretation.

## 2022-07-07 NOTE — Addendum Note (Signed)
Addended by: Earley Brooke on: 07/07/2022 11:50 AM   Modules accepted: Orders

## 2022-07-09 ENCOUNTER — Telehealth: Payer: Self-pay

## 2022-07-09 NOTE — Telephone Encounter (Signed)
Notified patient of completion of FMLA forms. Fax transmission confirmation received. Copy of forms emailed to Patient as requested. No other needs or concerns voiced at this time.

## 2022-07-14 ENCOUNTER — Inpatient Hospital Stay: Payer: No Typology Code available for payment source

## 2022-07-14 ENCOUNTER — Inpatient Hospital Stay: Payer: No Typology Code available for payment source | Admitting: Physician Assistant

## 2022-07-15 ENCOUNTER — Ambulatory Visit: Payer: No Typology Code available for payment source | Admitting: Pulmonary Disease

## 2022-07-16 NOTE — Telephone Encounter (Signed)
Dr. Judeth Horn was patient cleared for surgery yesterday?

## 2022-07-16 NOTE — Telephone Encounter (Signed)
He cancelled the appt

## 2022-07-20 ENCOUNTER — Other Ambulatory Visit: Payer: Self-pay | Admitting: Pulmonary Disease

## 2022-07-30 ENCOUNTER — Encounter: Payer: Self-pay | Admitting: Pulmonary Disease

## 2022-07-30 ENCOUNTER — Ambulatory Visit (INDEPENDENT_AMBULATORY_CARE_PROVIDER_SITE_OTHER): Payer: No Typology Code available for payment source | Admitting: Pulmonary Disease

## 2022-07-30 VITALS — BP 98/62 | HR 109 | Temp 97.7°F | Ht 67.0 in | Wt 205.0 lb

## 2022-07-30 DIAGNOSIS — Z79899 Other long term (current) drug therapy: Secondary | ICD-10-CM | POA: Diagnosis not present

## 2022-07-30 DIAGNOSIS — J9601 Acute respiratory failure with hypoxia: Secondary | ICD-10-CM | POA: Diagnosis not present

## 2022-07-30 DIAGNOSIS — Z5181 Encounter for therapeutic drug level monitoring: Secondary | ICD-10-CM | POA: Diagnosis not present

## 2022-07-30 MED ORDER — PREDNISONE 10 MG PO TABS: 10.0000 mg | ORAL_TABLET | Freq: Every day | ORAL | 3 refills | Status: DC

## 2022-07-30 MED ORDER — FUROSEMIDE 20 MG PO TABS
20.0000 mg | ORAL_TABLET | Freq: Every day | ORAL | 6 refills | Status: DC
Start: 2022-07-30 — End: 2022-09-30

## 2022-07-30 NOTE — Progress Notes (Signed)
Synopsis: Referred in January 2020 for pulmonary MAC by Philip Has, MD.  Previously seen by Dr. Erick [name].  Previously seen by Dr. Tonia [name].  Subjective:   PATIENT ID: Philip [name] GENDER: male DOB: 07-31-1955, MRN: [id number]  Chief Complaint  Patient presents with   Follow-up    SOB with exertion.  C/o cough with very thick, sticky gray mucus    Philip [name] is a 67 y.o.gentleman with a history of stage III lung adenocarcinoma, COPD With worsened symptoms recently over last several weeks now severe hypoxemia on up to 8 L nasal cannula.    Dyspnea largely unchanged.  Oxygenation largely unchanged.  Seems like with Oxymizer he is able to decrease his flow rate.  Also [name] a nonrebreather.  He got a new concentrator but only 10 L.  I requested a second at 6 L with changed to 12 L.  Regardless, doing okay but probably would benefit from having some extra reserve at home.  We discussed this.  He [name] some lower extremity swelling comes and goes.  Discussed trial of Lasix if it helps swelling in his breathing.  Thinks the prednisone helped some.  Helps keep congestion at bay.  Still [name] some productive cough but otherwise thinks it is help.  Not sure it is helped his breathing very much.       Past Medical History:  Diagnosis Date   COPD (chronic obstructive pulmonary disease) (HCC)    Dyspnea    GERD (gastroesophageal reflux disease)    Headache    migraines   Hyperlipidemia    lung ca dx'd 12/2018   On home oxygen therapy    3 to 10 L   Pneumonia    Pulmonary embolus (HCC)    Tobacco abuse      Family History  Problem Relation Age of Onset   Cancer Father    Stomach cancer Father      Past Surgical History:  Procedure Laterality Date   COLONOSCOPY     CYSTOSCOPY W/ URETERAL STENT PLACEMENT Right 10/24/2021   Procedure: CYSTOSCOPY WITH RETROGRADE PYELOGRAM/URETERAL STENT PLACEMENT;  Surgeon: Jannifer Hick, MD;  Location: [facility];  Service: Urology;  Laterality: Right;    HAND SURGERY     VASECTOMY     VIDEO BRONCHOSCOPY WITH ENDOBRONCHIAL ULTRASOUND N/A 12/22/2018   Procedure: VIDEO BRONCHOSCOPY WITH ENDOBRONCHIAL ULTRASOUND;  Surgeon: Josephine Igo, DO;  Location: [facility];  Service: Thoracic;  Laterality: N/A;   VIDEO BRONCHOSCOPY WITH ENDOBRONCHIAL ULTRASOUND N/A 01/09/2019   Procedure: VIDEO BRONCHOSCOPY WITH ENDOBRONCHIAL ULTRASOUND WITH FLUORO;  Surgeon: Josephine Igo, DO;  Location: [facility];  Service: Thoracic;  Laterality: N/A;   VIDEO BRONCHOSCOPY WITH RADIAL ENDOBRONCHIAL ULTRASOUND N/A 12/22/2018   Procedure: RADIAL ENDOBRONCHIAL ULTRASOUND;  Surgeon: Josephine Igo, DO;  Location: [facility];  Service: Thoracic;  Laterality: N/A;    Social History   Socioeconomic History   Marital status: Married    Spouse name: Not on file   Number of children: Not on file   Years of education: Not on file   Highest education level: Not on file  Occupational History   Not on file  Tobacco Use   Smoking status: Former    Packs/day: 2.00    Years: 45.00    Additional pack years: 0.00    Total pack years: 90.00    Types: Cigarettes    Start date: 07/04/1970    Quit date: 12/2021    Years since quitting: 0.6  Smokeless tobacco: Former    Types: Chew   Tobacco comments:    Smoking 2 packs of cigarettes a week. 10/14/2021 Tay    Smoking is on and off, will go a week without smoking then start up and smoke half a pack a day  Vaping Use   Vaping Use: Never used  Substance and Sexual Activity   Alcohol use: Yes    Comment: hasn't drank in one month   Drug use: Not Currently    Types: Marijuana   Sexual activity: Not on file  Other Topics Concern   Not on file  Social History Narrative   Not on file   Social Determinants of Health   Financial Resource Strain: Not on file  Food Insecurity: No Food Insecurity (01/12/2022)   Hunger Vital Sign    Worried About Running Out of Food in the Last Year: Never true    Ran Out of Food in the Last Year: Never  true  Transportation Needs: No Transportation Needs (01/12/2022)   PRAPARE - Administrator, Civil Service (Medical): No    Lack of Transportation (Non-Medical): No  Physical Activity: Not on file  Stress: Not on file  Social Connections: Not on file  Intimate Partner Violence: Not At Risk (01/12/2022)   Humiliation, Afraid, Rape, and Kick questionnaire    Fear of Current or Ex-Partner: No    Emotionally Abused: No    Physically Abused: No    Sexually Abused: No     Allergies  Allergen Reactions   Carboplatin Anaphylaxis, Shortness Of Breath, Cough and Hypertension    On dose 8. Became short of breath. Symptom management was called. Received benadryl, solumedrol, Pepcid and fluids. Medication discontinued.    Klonopin [Clonazepam] Other (See Comments)    nervous   Norco [Hydrocodone-Acetaminophen] Other (See Comments)    Made pt feel jittery      Immunization History  Administered Date(s) Administered   Influenza Split 11/20/2008   Influenza,inj,Quad PF,6+ Mos 10/24/2018, 11/08/2018, 01/15/2020   Influenza,inj,quad, With Preservative 03/15/2015   PFIZER(Purple Top)SARS-COV-2 Vaccination 04/27/2019, 05/11/2019, 12/07/2019, 12/26/2019   PNEUMOCOCCAL CONJUGATE-20 05/12/2021   Pneumococcal Polysaccharide-23 12/12/2007, 10/10/2019   Td 05/18/2014   Tdap 11/16/2006, 05/18/2014    Outpatient Medications Prior to Visit  Medication Sig Dispense Refill   albuterol (PROVENTIL) (2.5 MG/3ML) 0.083% nebulizer solution Take 3 mLs (2.5 mg total) by nebulization every 4 (four) hours as needed for wheezing or shortness of breath. 75 mL 2   albuterol (VENTOLIN HFA) 108 (90 Base) MCG/ACT inhaler Inhale 2 puffs into the lungs every 4 (four) hours as needed for wheezing or shortness of breath. 18 g 11   Budeson-Glycopyrrol-Formoterol (BREZTRI AEROSPHERE) 160-9-4.8 MCG/ACT AERO Inhale 2 puffs into the lungs in the morning and at bedtime. 1 each 3   dextromethorphan-guaiFENesin (MUCINEX  DM) 30-600 MG 12hr tablet Take 1 tablet by mouth 2 (two) times daily as needed for cough. 40 tablet 0   folic acid (FOLVITE) 1 MG tablet TAKE 1 TABLET BY MOUTH EVERY DAY 90 tablet 1   gabapentin (NEURONTIN) 300 MG capsule Take 1 capsule (300 mg total) by mouth 2 (two) times daily. 60 capsule 1   HYDROcodone-acetaminophen (NORCO/VICODIN) 5-325 MG tablet Take 1 tablet by mouth every 12 (twelve) hours as needed for moderate pain. 45 tablet 0   midodrine (PROAMATINE) 5 MG tablet TAKE 1 TABLET (5 MG TOTAL) BY MOUTH 3 (THREE) TIMES DAILY WITH MEALS. 270 tablet 1   Multiple Vitamins-Minerals (MULTI ADULT GUMMIES)  CHEW Chew 1 tablet by mouth in the morning and at bedtime.     naphazoline-pheniramine (VISINE) 0.025-0.3 % ophthalmic solution Place 1 drop into both eyes daily as needed for eye irritation.     pantoprazole (PROTONIX) 40 MG tablet TAKE 1 TABLET BY MOUTH EVERY DAY 90 tablet 1   tamsulosin (FLOMAX) 0.4 MG CAPS capsule Take 0.4 mg by mouth at bedtime.     XARELTO 20 MG TABS tablet TAKE 1 TABLET BY MOUTH DAILY WITH SUPPER. 30 tablet 11   predniSONE (DELTASONE) 10 MG tablet Take 1 tablet (10 mg total) by mouth daily with breakfast. 90 tablet 0   No facility-administered medications prior to visit.   Review of systems: N/AA   Objective:   Vitals:   07/30/22 1515  BP: 98/62  Pulse: (!) 109  Temp: 97.7 F (36.5 C)  TempSrc: Oral  SpO2: 98%  Weight: 205 lb (93 kg)  Height: 5\' 7"  (1.702 m)   98% on   RA BMI Readings from Last 3 Encounters:  07/30/22 32.11 kg/m  06/25/22 32.64 kg/m  05/13/22 32.12 kg/m   Wt Readings from Last 3 Encounters:  07/30/22 205 lb (93 kg)  06/25/22 202 lb 4 oz (91.7 kg)  05/13/22 199 lb (90.3 kg)   Physical exam General: Sitting in wheelchair, chronically ill-appearing Pulmonary: Relatively clear but distant, normal work of breathing on oxygen Neuro: No focal deficits, sensation appears intact Cardiovascular: Pitting edema to just above ankle  bilaterally, warm Psych: Normal mood, full affect   CBC    Component Value Date/Time   WBC 8.3 06/25/2022 1035   WBC 13.2 (H) 01/20/2022 0645   RBC 4.47 06/25/2022 1035   HGB 13.3 06/25/2022 1035   HCT 42.5 06/25/2022 1035   PLT 230 06/25/2022 1035   MCV 95.1 06/25/2022 1035   MCH 29.8 06/25/2022 1035   MCHC 31.3 06/25/2022 1035   RDW 14.1 06/25/2022 1035   LYMPHSABS 0.9 06/25/2022 1035   MONOABS 0.7 06/25/2022 1035   EOSABS 0.1 06/25/2022 1035   BASOSABS 0.1 06/25/2022 1035    CHEMISTRY No results for input(s): "NA", "K", "CL", "CO2", "GLUCOSE", "BUN", "CREATININE", "CALCIUM", "MG", "PHOS" in the last 168 hours. CrCl cannot be calculated (Patient's most recent lab result is older than the maximum 21 days allowed.).   Chest Imaging- films reviewed: CTA PE protocol 01/2021 with fibrotic changes around initial mass and enlarging nodular mass over 2 cm  CT PET 01/03/2019-severe emphysema, right hilar mass with air bronchograms, medial RLL cystic fibrotic changes.  PET avidity of the inferior portion of the mass and medial fibrotic areas of the lower lobe.  CT chest with contrast 04/10/2018-persistent right mass, unchanged right paraseptal emphysema versus cavitary lesions.  Improved adenopathy.  CT chest with contrast 10/06/2019-bilateral centrilobular and paraseptal emphysema.  Increased opacification of right lower lobe cystic area in the medial base compared to March 2021, but improved compared to June 2021 CT.  Persistence of hilar mass on the right.  New irregularly shaped opacity in the left upper lobe.  Increased areas of patchy groundglass and septal thickening in the right upper lobe.  Micro: 01/09/2019 AFB: Component 1 mo ago  Organism ID [id number]    Comment: Mycobacterium avium complex  Amikacin Comment   Comment: 16.0 ug/mL Susceptible  Clarithromycin 2.0 ug/mL Susceptible   Linezolid Comment   Comment: 16.0 ug/mL Intermediate  Moxifloxacin 4.0 ug/mL Resistant    Streptomycin 64.0 ug/mL    01/09/2019 fungus culture-negative 01/09/2019 BAL-many PMN, negative culture  06/17/2019 respiratory culture-normal flora  Pulmonary Functions Testing Results:    Latest Ref Rng & Units 12/21/2018   12:38 PM  PFT Results  FVC-Pre L 3.07   FVC-Predicted Pre % 76   FVC-Post L 3.23   FVC-Predicted Post % 80   Pre FEV1/FVC % % 67   Post FEV1/FCV % % 71   FEV1-Pre L 2.07   FEV1-Predicted Pre % 68   FEV1-Post L 2.28   DLCO uncorrected ml/min/mmHg 15.47   DLCO UNC% % 64   DLVA Predicted % 79   TLC L 5.35   TLC % Predicted % 86   RV % Predicted % 104    2020- mild obstruction without significant bronchodilator reversibility.  No significant restriction, air trapping, or hyperinflation.  Mildly reduced diffusion.  Flow volume loop supports obstruction.  Pathology 01/09/2019:  RLL adenocarcinoma November 2020 bronch nondiagnostic     Assessment & Plan:     ICD-10-CM   1. Acute respiratory failure with hypoxia (HCC)  J96.01 Ambulatory Referral for DME    Basic Metabolic Panel (BMET)    2. Encounter for monitoring diuretic therapy  Z51.81    Z79.899       Chronic hypoxemic respiratory failure: Intermittent in the past.  Persistent since hospitalization pneumonia 01/2022.  Worsening over time.  He [name] no reserve with his significant emphysema and radiation fibrosis, lung cancer.  I do not anticipate this will get much better.  Discussed anticipated will eventually continue to worsen. -- Additional order for home concentrator to achieve minimum 15 minutes, ideally 20 L flow -- Continue Oxymizer and nonrebreather as needed  Lung adenocarcinoma- stage IIIc (T4N3M0).  Previously received carboplatin, paclitaxel, and radiation from Dec 2020-  mid March 2021, imfimzi 02 May 2019.  Unfortunate recurrence fall 2022 now on maintenance chemotherapy, carboplatin stopped after second cycle given anaphylaxis. -Ongoing management per oncology.   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 Resistant to quinolones; intermediate susceptibility to linezolid.  Low suspicion for active disease. - Traditionally, would avoid ICS but given his ongoing symptoms of shortness of breath, okay to trial and if no improvement return to using Stiolto -PLEASE AVOID MONOTHERAPY WITH MACROLIDES DUE TO RISK OF INDUCIBLE RESISTANCE OF MAI.  Pulmonary embolus:  -Provoked in setting malignancy.  Xarelto to continue indefinitely in the setting of malignancy.    Lower extremity swelling: -- New prescription Lasix 20 mg daily, repeat labs next week to assess for electrolytes, kidney function  RTC in 2 months with Dr. Judeth Horn.     Current Outpatient Medications:    albuterol (PROVENTIL) (2.5 MG/3ML) 0.083% nebulizer solution, Take 3 mLs (2.5 mg total) by nebulization every 4 (four) hours as needed for wheezing or shortness of breath., Disp: 75 mL, Rfl: 2   albuterol (VENTOLIN HFA) 108 (90 Base) MCG/ACT inhaler, Inhale 2 puffs into the lungs every 4 (four) hours as needed for wheezing or shortness of breath., Disp: 18 g, Rfl: 11   Budeson-Glycopyrrol-Formoterol (BREZTRI AEROSPHERE) 160-9-4.8 MCG/ACT AERO, Inhale 2 puffs into the lungs in the morning and at bedtime., Disp: 1 each, Rfl: 3   dextromethorphan-guaiFENesin (MUCINEX DM) 30-600 MG 12hr tablet, Take 1 tablet by mouth 2 (two) times daily as needed for cough., Disp: 40 tablet, Rfl: 0   folic acid (FOLVITE) 1 MG tablet, TAKE 1 TABLET BY MOUTH EVERY DAY, Disp: 90 tablet, Rfl: 1   furosemide (  LASIX) 20 MG tablet, Take 1 tablet (20 mg total) by mouth daily., Disp: 30 tablet, Rfl: 6   gabapentin (NEURONTIN) 300 MG capsule, Take 1 capsule (300 mg total) by mouth 2 (two) times daily., Disp: 60 capsule, Rfl: 1   HYDROcodone-acetaminophen (NORCO/VICODIN) 5-325 MG tablet, Take 1 tablet by mouth every 12 (twelve) hours  as needed for moderate pain., Disp: 45 tablet, Rfl: 0   midodrine (PROAMATINE) 5 MG tablet, TAKE 1 TABLET (5 MG TOTAL) BY MOUTH 3 (THREE) TIMES DAILY WITH MEALS., Disp: 270 tablet, Rfl: 1   Multiple Vitamins-Minerals (MULTI ADULT GUMMIES) CHEW, Chew 1 tablet by mouth in the morning and at bedtime., Disp: , Rfl:    naphazoline-pheniramine (VISINE) 0.025-0.3 % ophthalmic solution, Place 1 drop into both eyes daily as needed for eye irritation., Disp: , Rfl:    pantoprazole (PROTONIX) 40 MG tablet, TAKE 1 TABLET BY MOUTH EVERY DAY, Disp: 90 tablet, Rfl: 1   tamsulosin (FLOMAX) 0.4 MG CAPS capsule, Take 0.4 mg by mouth at bedtime., Disp: , Rfl:    XARELTO 20 MG TABS tablet, TAKE 1 TABLET BY MOUTH DAILY WITH SUPPER., Disp: 30 tablet, Rfl: 11   predniSONE (DELTASONE) 10 MG tablet, Take 1 tablet (10 mg total) by mouth daily with breakfast., Disp: 90 tablet, Rfl: 3  I spent 40 minutes in the care of the patient including face-to-face visit, review of records, coordination of care  Karren Burly,  MD [facility] 07/30/2022 3:56 PM

## 2022-07-30 NOTE — Patient Instructions (Signed)
Take Lasix 20 mg in the morning and see if this helps with the swelling and may help your breathing a bit  Continue prednisone 10 mg once a day, I refilled this today  Continue all inhalers, no other changes  Come back for lab work next Wednesday or Friday to make sure kidneys and electrolytes okay with starting new Lasix  Return to clinic in 2 months or sooner as needed with Dr. Judeth Horn

## 2022-07-31 ENCOUNTER — Other Ambulatory Visit: Payer: Self-pay | Admitting: Internal Medicine

## 2022-08-03 ENCOUNTER — Other Ambulatory Visit: Payer: No Typology Code available for payment source

## 2022-08-03 ENCOUNTER — Ambulatory Visit: Payer: No Typology Code available for payment source | Admitting: Internal Medicine

## 2022-08-03 ENCOUNTER — Ambulatory Visit: Payer: Self-pay

## 2022-08-04 ENCOUNTER — Other Ambulatory Visit: Payer: Self-pay | Admitting: Internal Medicine

## 2022-08-04 DIAGNOSIS — G893 Neoplasm related pain (acute) (chronic): Secondary | ICD-10-CM

## 2022-08-04 DIAGNOSIS — C3490 Malignant neoplasm of unspecified part of unspecified bronchus or lung: Secondary | ICD-10-CM

## 2022-08-04 DIAGNOSIS — Z515 Encounter for palliative care: Secondary | ICD-10-CM

## 2022-08-05 ENCOUNTER — Encounter: Payer: Self-pay | Admitting: Physician Assistant

## 2022-08-05 MED ORDER — HYDROCODONE-ACETAMINOPHEN 5-325 MG PO TABS
1.0000 | ORAL_TABLET | Freq: Two times a day (BID) | ORAL | 0 refills | Status: DC | PRN
Start: 2022-08-05 — End: 2022-09-30

## 2022-08-10 NOTE — Progress Notes (Unsigned)
Palliative Medicine [facility]  [phone] [phone]   Name: Philip [name] Date: 08/10/2022 MRN: [id number]  DOB: 12/14/1955  Patient Care Team: Farris Has, MD as PCP - General (Family Medicine) Syliva Overman, RN as Oncology Nurse Navigator Thomasene Ripple, RN as Case Manager (General Practice) Pickenpack-Cousar, Arty Baumgartner, NP as Nurse Practitioner (Nurse Practitioner)   I connected with Philip [name] on 08/10/22 at 10:00 AM EDT by phone and verified that I am speaking with the correct person using two identifiers.   I discussed the limitations, risks, security and privacy concerns of performing an evaluation and management service by telemedicine and the availability of in-person appointments. I also discussed with the patient that there may be a patient responsible charge related to this service. The patient expressed understanding and agreed to proceed.   Other persons participating in the visit and their role in the encounter: n/a   Patient's location: home  Provider's location: [facility]   Chief Complaint: f/u of symptom management   INTERVAL HISTORY: Philip [name] is a 67 y.o. male with oncologic medical history including recurrent non-small cell lung cancer (01/2019 then again 12/2020). Past medical history also includes COPD, BPH, tobacco use, PE, and GERD. Palliative ask to see for symptom management and goals of care.   SOCIAL HISTORY:     reports that he quit smoking about 8 months ago. His smoking use included cigarettes. He started smoking about 52 years ago. He has a 90.00 pack-year smoking history. He has quit using smokeless tobacco.  His smokeless tobacco use included chew. He reports current alcohol use. He reports that he does not currently use drugs after having used the following drugs: Marijuana.  ADVANCE DIRECTIVES:  None on file   CODE STATUS: Full code  PAST MEDICAL HISTORY: Past Medical History:   Diagnosis Date   COPD (chronic obstructive pulmonary disease) (HCC)    Dyspnea    GERD (gastroesophageal reflux disease)    Headache    migraines   Hyperlipidemia    lung ca dx'd 12/2018   On home oxygen therapy    3 to 10 L   Pneumonia    Pulmonary embolus (HCC)    Tobacco abuse     ALLERGIES:  is allergic to carboplatin, klonopin [clonazepam], and norco [hydrocodone-acetaminophen].  MEDICATIONS:  Current Outpatient Medications  Medication Sig Dispense Refill   albuterol (PROVENTIL) (2.5 MG/3ML) 0.083% nebulizer solution Take 3 mLs (2.5 mg total) by nebulization every 4 (four) hours as needed for wheezing or shortness of breath. 75 mL 2   albuterol (VENTOLIN HFA) 108 (90 Base) MCG/ACT inhaler Inhale 2 puffs into the lungs every 4 (four) hours as needed for wheezing or shortness of breath. 18 g 11   Budeson-Glycopyrrol-Formoterol (BREZTRI AEROSPHERE) 160-9-4.8 MCG/ACT AERO Inhale 2 puffs into the lungs in the morning and at bedtime. 1 each 3   dextromethorphan-guaiFENesin (MUCINEX DM) 30-600 MG 12hr tablet Take 1 tablet by mouth 2 (two) times daily as needed for cough. 40 tablet 0   folic acid (FOLVITE) 1 MG tablet TAKE 1 TABLET BY MOUTH EVERY DAY 90 tablet 1   furosemide (LASIX) 20 MG tablet Take 1 tablet (20 mg total) by mouth daily. 30 tablet 6   gabapentin (NEURONTIN) 300 MG capsule Take 1 capsule (300 mg total) by mouth 2 (two) times daily. 60 capsule 1   HYDROcodone-acetaminophen (NORCO/VICODIN) 5-325 MG tablet Take 1 tablet by mouth every 12 (twelve) hours as needed for  moderate pain. 45 tablet 0   midodrine (PROAMATINE) 5 MG tablet TAKE 1 TABLET (5 MG TOTAL) BY MOUTH 3 (THREE) TIMES DAILY WITH MEALS. 270 tablet 1   Multiple Vitamins-Minerals (MULTI ADULT GUMMIES) CHEW Chew 1 tablet by mouth in the morning and at bedtime.     naphazoline-pheniramine (VISINE) 0.025-0.3 % ophthalmic solution Place 1 drop into both eyes daily as needed for eye irritation.     pantoprazole  (PROTONIX) 40 MG tablet TAKE 1 TABLET BY MOUTH EVERY DAY 90 tablet 1   predniSONE (DELTASONE) 10 MG tablet Take 1 tablet (10 mg total) by mouth daily with breakfast. 90 tablet 3   tamsulosin (FLOMAX) 0.4 MG CAPS capsule Take 0.4 mg by mouth at bedtime.     XARELTO 20 MG TABS tablet TAKE 1 TABLET BY MOUTH DAILY WITH SUPPER. 30 tablet 11   No current facility-administered medications for this visit.    VITAL SIGNS: There were no vitals taken for this visit. There were no vitals filed for this visit.  Estimated body mass index is 32.11 kg/m as calculated from the following:   Height as of 07/30/22: 5\' 7"  (1.702 m).   Weight as of 07/30/22: 205 lb (93 kg).   PERFORMANCE STATUS (ECOG) : 1 - Symptomatic but completely ambulatory   IMPRESSION: I connected by phone with Philip [name]. No acute distress identified. He is trying to remain as active as possible. Denies nausea, vomiting, constipation, or diarrhea. Appetite is good. Continues to have some shortness of breath requiring home oxygen. His wife is assisting with ADLs. Denies cough.   Philip [name] reports pain controlled on current regimen. Is taking Hydrocodone as needed. Does not require around the clock. Using as prescribed. Gabapentin 300 mg twice daily. Prescription refills are appropriate.   We will continue to closely monitor and support.   PLAN:  Hydrocodone 5/325 mg one tablet every 12 hours as needed for moderate to severe pain.  Pain contract completed on 2/7 Gabapentin 300mg  twice daily  Ongoing goals of care and symptom management support.  I will plan to see patient back in 3-4 weeks in collaboration to other oncology appointments.    Patient expressed understanding and was in agreement with this plan. He also understands that He can call the clinic at any time with any questions, concerns, or complaints.   Any controlled substances utilized were prescribed in the context of palliative care. PDMP has been reviewed.    Visit  consisted of counseling and education dealing with the complex and emotionally intense issues of symptom management and palliative care in the setting of serious and potentially life-threatening illness.Greater than 50%  of this time was spent counseling and coordinating care related to the above assessment and plan.  Willette Alma, AGPCNP-BC  Palliative Medicine Team/[facility]  *Please note that this is a verbal dictation therefore any spelling or grammatical errors are due to the "[employer] One" system interpretation.

## 2022-08-11 ENCOUNTER — Inpatient Hospital Stay: Payer: No Typology Code available for payment source | Attending: Internal Medicine | Admitting: Nurse Practitioner

## 2022-08-11 ENCOUNTER — Encounter: Payer: Self-pay | Admitting: Nurse Practitioner

## 2022-08-11 DIAGNOSIS — C3490 Malignant neoplasm of unspecified part of unspecified bronchus or lung: Secondary | ICD-10-CM

## 2022-08-11 DIAGNOSIS — Z515 Encounter for palliative care: Secondary | ICD-10-CM | POA: Diagnosis not present

## 2022-08-11 DIAGNOSIS — M792 Neuralgia and neuritis, unspecified: Secondary | ICD-10-CM | POA: Diagnosis not present

## 2022-08-11 DIAGNOSIS — G893 Neoplasm related pain (acute) (chronic): Secondary | ICD-10-CM | POA: Diagnosis not present

## 2022-08-11 DIAGNOSIS — R530 Neoplastic (malignant) related fatigue: Secondary | ICD-10-CM

## 2022-08-17 ENCOUNTER — Other Ambulatory Visit: Payer: Self-pay | Admitting: Internal Medicine

## 2022-08-18 ENCOUNTER — Ambulatory Visit: Payer: No Typology Code available for payment source | Admitting: Pulmonary Disease

## 2022-09-03 ENCOUNTER — Encounter: Payer: Self-pay | Admitting: Physician Assistant

## 2022-09-03 ENCOUNTER — Encounter: Payer: Self-pay | Admitting: Internal Medicine

## 2022-09-14 ENCOUNTER — Other Ambulatory Visit: Payer: Self-pay | Admitting: Urology

## 2022-09-15 ENCOUNTER — Encounter: Payer: Self-pay | Admitting: Internal Medicine

## 2022-09-15 ENCOUNTER — Other Ambulatory Visit: Payer: Self-pay | Admitting: Pulmonary Disease

## 2022-09-15 ENCOUNTER — Other Ambulatory Visit: Payer: Self-pay | Admitting: Internal Medicine

## 2022-09-15 ENCOUNTER — Telehealth: Payer: Self-pay | Admitting: Pulmonary Disease

## 2022-09-15 DIAGNOSIS — I959 Hypotension, unspecified: Secondary | ICD-10-CM

## 2022-09-15 DIAGNOSIS — J449 Chronic obstructive pulmonary disease, unspecified: Secondary | ICD-10-CM

## 2022-09-15 NOTE — Telephone Encounter (Signed)
Zona called in to see if we have received the surgical clearance forms  Ext [phone]

## 2022-09-17 NOTE — Telephone Encounter (Signed)
Spoke with Idalia Needle who handles surgical clearance, forms have been received, patient is scheduled for an OV 09/30/2022 with Dr. Judeth Horn for clearance. Left voicemail informing Zona of this information.

## 2022-09-18 ENCOUNTER — Encounter: Payer: Self-pay | Admitting: Internal Medicine

## 2022-09-18 ENCOUNTER — Encounter: Payer: Self-pay | Admitting: Physician Assistant

## 2022-09-25 ENCOUNTER — Encounter (HOSPITAL_COMMUNITY): Payer: Self-pay

## 2022-09-25 ENCOUNTER — Inpatient Hospital Stay: Payer: No Typology Code available for payment source | Attending: Internal Medicine

## 2022-09-25 ENCOUNTER — Telehealth: Payer: Self-pay | Admitting: Physician Assistant

## 2022-09-25 ENCOUNTER — Ambulatory Visit (HOSPITAL_COMMUNITY)
Admission: RE | Admit: 2022-09-25 | Discharge: 2022-09-25 | Disposition: A | Discharge status: Home / Self Care | Payer: No Typology Code available for payment source | Source: Ambulatory Visit | Attending: Physician Assistant | Admitting: Physician Assistant

## 2022-09-25 DIAGNOSIS — C3490 Malignant neoplasm of unspecified part of unspecified bronchus or lung: Secondary | ICD-10-CM

## 2022-09-25 DIAGNOSIS — C3411 Malignant neoplasm of upper lobe, right bronchus or lung: Secondary | ICD-10-CM | POA: Insufficient documentation

## 2022-09-25 DIAGNOSIS — Z87891 Personal history of nicotine dependence: Secondary | ICD-10-CM | POA: Insufficient documentation

## 2022-09-25 DIAGNOSIS — M25512 Pain in left shoulder: Secondary | ICD-10-CM | POA: Insufficient documentation

## 2022-09-25 LAB — CBC WITH DIFFERENTIAL (CANCER CENTER ONLY)
Abs Immature Granulocytes: 0.17 10*3/uL — ABNORMAL HIGH (ref 0.00–0.07)
Basophils Absolute: 0.1 10*3/uL (ref 0.0–0.1)
Basophils Relative: 1 %
Eosinophils Absolute: 0.2 10*3/uL (ref 0.0–0.5)
Eosinophils Relative: 2 %
HCT: 42.3 % (ref 39.0–52.0)
Hemoglobin: 14.1 g/dL (ref 13.0–17.0)
Immature Granulocytes: 2 %
Lymphocytes Relative: 9 %
Lymphs Abs: 0.8 10*3/uL (ref 0.7–4.0)
MCH: 29.7 pg (ref 26.0–34.0)
MCHC: 33.3 g/dL (ref 30.0–36.0)
MCV: 89.1 fL (ref 80.0–100.0)
Monocytes Absolute: 0.7 10*3/uL (ref 0.1–1.0)
Monocytes Relative: 7 %
Neutro Abs: 7.3 10*3/uL (ref 1.7–7.7)
Neutrophils Relative %: 79 %
Platelet Count: 241 10*3/uL (ref 150–400)
RBC: 4.75 MIL/uL (ref 4.22–5.81)
RDW: 14 % (ref 11.5–15.5)
WBC Count: 9.2 10*3/uL (ref 4.0–10.5)
nRBC: 0 % (ref 0.0–0.2)

## 2022-09-25 LAB — CMP (CANCER CENTER ONLY)
ALT: 17 U/L (ref 0–44)
AST: 16 U/L (ref 15–41)
Albumin: 3.7 g/dL (ref 3.5–5.0)
Alkaline Phosphatase: 79 U/L (ref 38–126)
Anion gap: 8 (ref 5–15)
BUN: 20 mg/dL (ref 8–23)
CO2: 30 mmol/L (ref 22–32)
Calcium: 9.1 mg/dL (ref 8.9–10.3)
Chloride: 101 mmol/L (ref 98–111)
Creatinine: 1.39 mg/dL — ABNORMAL HIGH (ref 0.61–1.24)
GFR, Estimated: 56 mL/min — ABNORMAL LOW (ref >60)
Glucose, Bld: 150 mg/dL — ABNORMAL HIGH (ref 70–99)
Potassium: 4 mmol/L (ref 3.5–5.1)
Sodium: 139 mmol/L (ref 135–145)
Total Bilirubin: 0.5 mg/dL (ref 0.3–1.2)
Total Protein: 7.2 g/dL (ref 6.5–8.1)

## 2022-09-25 MED ORDER — IOHEXOL 300 MG/ML  SOLN: 500.0000 mL | Freq: Once | INTRAMUSCULAR | Status: DC | PRN

## 2022-09-25 MED ORDER — SODIUM CHLORIDE (PF) 0.9 % IJ SOLN
INTRAMUSCULAR | Status: AC
  Filled 2022-09-25: qty 50

## 2022-09-25 MED ORDER — IOHEXOL 300 MG/ML  SOLN
100.0000 mL | Freq: Once | INTRAMUSCULAR | Status: AC | PRN
  Administered 2022-09-25: 100 mL via INTRAVENOUS

## 2022-09-25 MED ORDER — IOHEXOL 9 MG/ML PO SOLN
1000.0000 mL | ORAL | Status: AC
  Administered 2022-09-25: 1000 mL via ORAL

## 2022-09-25 MED ORDER — IOHEXOL 300 MG/ML  SOLN: 500.0000 mL | Freq: Once | INTRAMUSCULAR | Status: DC

## 2022-09-25 NOTE — Telephone Encounter (Signed)
I called the patient but was unable to reach him.  He may be getting his CT scan at the moment.  Looks like he might be slightly dehydrated according to his labs today.  I left a voicemail instructing him to hydrate well with water and to make sure he did not have any recent N/V/D. We will see him as scheduled next week.

## 2022-09-28 NOTE — Patient Instructions (Signed)
SURGICAL WAITING ROOM VISITATION  Patients having surgery or a procedure may have no more than 2 support people in the waiting area - these visitors may rotate.    Children under the age of 63 must have an adult with them who is not the patient.  Due to an increase in RSV and influenza rates and associated hospitalizations, children ages 29 and under may not visit patients in [facility] hospitals.  If the patient needs to stay at the hospital during part of their recovery, the visitor guidelines for inpatient rooms apply. Pre-op nurse will coordinate an appropriate time for 1 support person to accompany patient in pre-op.  This support person may not rotate.    Please refer to the [facility] website for the visitor guidelines for Inpatients (after your surgery is over and you are in a regular room).       Your procedure is scheduled on: 10/12/22    Report to [facility] Main Entrance    Report to admitting at   1130 AM   Call this number if you have problems the morning of surgery [phone]   Do not eat food  or drink liquids :After Midnight.                            If you have questions, please contact your surgeon's office.      Oral Hygiene is also important to reduce your risk of infection.                                    Remember - BRUSH YOUR TEETH THE MORNING OF SURGERY WITH YOUR REGULAR TOOTHPASTE  DENTURES WILL BE REMOVED PRIOR TO SURGERY PLEASE DO NOT APPLY "Poly grip" OR ADHESIVES!!!   Do NOT smoke after Midnight   Stop all vitamins and herbal supplements 7 days before surgery. Nebulizer if needed, inhalers as usual and bring, zyrtec, midodrine, protonix    Take these medicines the morning of surgery with A SIP OF WATER:   DO NOT TAKE ANY ORAL DIABETIC MEDICATIONS DAY OF YOUR SURGERY  Bring CPAP mask and tubing day of surgery.                              You may not have any metal on your body including hair pins, jewelry, and body  piercing             Do not wear make-up, lotions, powders, perfumes/cologne, or deodorant  Do not wear nail polish including gel and S&S, artificial/acrylic nails, or any other type of covering on natural nails including finger and toenails. If you have artificial nails, gel coating, etc. that needs to be removed by a nail salon please have this removed prior to surgery or surgery may need to be canceled/ delayed if the surgeon/ anesthesia feels like they are unable to be safely monitored.   Do not shave  48 hours prior to surgery.               Men may shave face and neck.   Do not bring valuables to the hospital. [place] IS NOT             RESPONSIBLE   FOR VALUABLES.   Contacts, glasses, dentures or bridgework may not be worn into surgery.  Bring small overnight bag day of surgery.   DO NOT BRING YOUR HOME MEDICATIONS TO THE HOSPITAL. PHARMACY WILL DISPENSE MEDICATIONS LISTED ON YOUR MEDICATION LIST TO YOU DURING YOUR ADMISSION IN THE HOSPITAL!    Patients discharged on the day of surgery will not be allowed to drive home.  Someone NEEDS to stay with you for the first 24 hours after anesthesia.   Special Instructions: Bring a copy of your healthcare power of attorney and living will documents the day of surgery if you haven't scanned them before.              Please read over the following fact sheets you were given: IF YOU HAVE QUESTIONS ABOUT YOUR PRE-OP INSTRUCTIONS PLEASE CALL [phone]   If you received a COVID test during your pre-op visit  it is requested that you wear a mask when out in public, stay away from anyone that may not be feeling well and notify your surgeon if you develop symptoms. If you test positive for Covid or have been in contact with anyone that has tested positive in the last 10 days please notify you surgeon.    [facility] - Preparing for Surgery Before surgery, you can play an important role.  Because skin is not sterile, your skin needs to be  as free of germs as possible.  You can reduce the number of germs on your skin by washing with CHG (chlorahexidine gluconate) soap before surgery.  CHG is an antiseptic cleaner which kills germs and bonds with the skin to continue killing germs even after washing. Please DO NOT use if you have an allergy to CHG or antibacterial soaps.  If your skin becomes reddened/irritated stop using the CHG and inform your nurse when you arrive at Short Stay. Do not shave (including legs and underarms) for at least 48 hours prior to the first CHG shower.  You may shave your face/neck. Please follow these instructions carefully:  1.  Shower with CHG Soap the night before surgery and the  morning of Surgery.  2.  If you choose to wash your hair, wash your hair first as usual with your  normal  shampoo.  3.  After you shampoo, rinse your hair and body thoroughly to remove the  shampoo.                           4.  Use CHG as you would any other liquid soap.  You can apply chg directly  to the skin and wash                       Gently with a scrungie or clean washcloth.  5.  Apply the CHG Soap to your body ONLY FROM THE NECK DOWN.   Do not use on face/ open                           Wound or open sores. Avoid contact with eyes, ears mouth and genitals (private parts).                       Wash face,  Genitals (private parts) with your normal soap.             6.  Wash thoroughly, paying special attention to the area where your surgery  will be performed.  7.  Thoroughly rinse your body  with warm water from the neck down.  8.  DO NOT shower/wash with your normal soap after using and rinsing off  the CHG Soap.                9.  Pat yourself dry with a clean towel.            10.  Wear clean pajamas.            11.  Place clean sheets on your bed the night of your first shower and do not  sleep with pets. Day of Surgery : Do not apply any lotions/deodorants the morning of surgery.  Please wear clean clothes to the  hospital/surgery center.  FAILURE TO FOLLOW THESE INSTRUCTIONS MAY RESULT IN THE CANCELLATION OF YOUR SURGERY PATIENT SIGNATURE_________________________________  NURSE SIGNATURE__________________________________  ________________________________________________________________________

## 2022-09-28 NOTE — Progress Notes (Signed)
Anesthesia Review:  EAV:[id number]  Oncology- DR Arbutus Ped LOV  09/30/22  Pulmon- DR Vilma Meckel  LOV 09/30/22  Cardiologist : none  Chest x-ray : EKG : 01/12/22  Echo : Stress test: Cardiac Cath :  Activity level: cannot do a flight of stairs  Sleep Study/ CPAP : none  Fasting Blood Sugar :      / Checks Blood Sugar -- times a day:   Blood Thinner/ Instructions /Last Dose: ASA / Instructions/ Last Dose :    Xarelto- stop 3 days prior per pt    PT on continuous oxygen at home by mask.  AT rest- 3l with activity 5l.  PT has on continuous O2 sat monitor at preop.  PT states usually runs 91-92 at home.  PT states at beginning of preop appt he has 1/2 tank of oxygen for this appt and another tank of oxygen in car.   Labs done on 09/25/22- CMP and CBC at Oncology.   PT has Pulm appt on 09/30/22 at 1330 with DR Vilma Meckel.   PT with wife at preop .  PT reports some dizziness at times in last 2 weeks.  Wife reports that she has backed off the Midodrine at times due to weight gain and blood pressure has come up then again she has pt to take Midodrine.   PT has intermittent cough at preop which pt states is not new for him.   Due to pt status abbreviated preop appt .  Leticia Clas made aware of status at preop appt.  No new orders given.   PT discharged from preop appt with wife.

## 2022-09-30 ENCOUNTER — Ambulatory Visit (INDEPENDENT_AMBULATORY_CARE_PROVIDER_SITE_OTHER): Payer: No Typology Code available for payment source | Admitting: Pulmonary Disease

## 2022-09-30 ENCOUNTER — Inpatient Hospital Stay (HOSPITAL_BASED_OUTPATIENT_CLINIC_OR_DEPARTMENT_OTHER): Payer: No Typology Code available for payment source | Admitting: Nurse Practitioner

## 2022-09-30 ENCOUNTER — Encounter: Payer: Self-pay | Admitting: Nurse Practitioner

## 2022-09-30 ENCOUNTER — Encounter: Payer: Self-pay | Admitting: Pulmonary Disease

## 2022-09-30 ENCOUNTER — Inpatient Hospital Stay (HOSPITAL_BASED_OUTPATIENT_CLINIC_OR_DEPARTMENT_OTHER): Payer: No Typology Code available for payment source | Admitting: Internal Medicine

## 2022-09-30 ENCOUNTER — Other Ambulatory Visit: Payer: Self-pay

## 2022-09-30 ENCOUNTER — Encounter (HOSPITAL_COMMUNITY)
Admission: RE | Admit: 2022-09-30 | Discharge: 2022-09-30 | Disposition: A | Discharge status: Home / Self Care | Payer: No Typology Code available for payment source | Source: Ambulatory Visit | Attending: Urology | Admitting: Urology

## 2022-09-30 VITALS — BP 107/81 | HR 111 | Temp 97.7°F | Resp 20 | Ht 66.0 in | Wt 206.8 lb

## 2022-09-30 VITALS — BP 100/78 | HR 106 | Temp 98.2°F | Ht 66.0 in | Wt 207.4 lb

## 2022-09-30 DIAGNOSIS — J449 Chronic obstructive pulmonary disease, unspecified: Secondary | ICD-10-CM

## 2022-09-30 DIAGNOSIS — Z87891 Personal history of nicotine dependence: Secondary | ICD-10-CM | POA: Diagnosis not present

## 2022-09-30 DIAGNOSIS — Z515 Encounter for palliative care: Secondary | ICD-10-CM | POA: Diagnosis not present

## 2022-09-30 DIAGNOSIS — G893 Neoplasm related pain (acute) (chronic): Secondary | ICD-10-CM | POA: Diagnosis not present

## 2022-09-30 DIAGNOSIS — Z23 Encounter for immunization: Secondary | ICD-10-CM

## 2022-09-30 DIAGNOSIS — C3491 Malignant neoplasm of unspecified part of right bronchus or lung: Secondary | ICD-10-CM | POA: Diagnosis not present

## 2022-09-30 DIAGNOSIS — C3490 Malignant neoplasm of unspecified part of unspecified bronchus or lung: Secondary | ICD-10-CM

## 2022-09-30 DIAGNOSIS — R530 Neoplastic (malignant) related fatigue: Secondary | ICD-10-CM | POA: Diagnosis not present

## 2022-09-30 DIAGNOSIS — M25512 Pain in left shoulder: Secondary | ICD-10-CM | POA: Diagnosis not present

## 2022-09-30 DIAGNOSIS — C3411 Malignant neoplasm of upper lobe, right bronchus or lung: Secondary | ICD-10-CM | POA: Diagnosis present

## 2022-09-30 MED ORDER — FUROSEMIDE 20 MG PO TABS: 20.0000 mg | ORAL_TABLET | Freq: Every day | ORAL | Status: DC | PRN

## 2022-09-30 MED ORDER — HYDROCODONE-ACETAMINOPHEN 5-325 MG PO TABS
1.0000 | ORAL_TABLET | Freq: Two times a day (BID) | ORAL | 0 refills | Status: DC | PRN
Start: 2022-09-30 — End: 2022-10-04

## 2022-09-30 NOTE — Progress Notes (Signed)
Palliative Medicine [facility]  Telephone:([phone]) [phone] Fax:(336) [phone]   Name: Philip [name] Date: 09/30/2022 MRN: [id number]  DOB: 03-06-1955  Patient Care Team: Farris Has, MD as PCP - General (Family Medicine) Syliva Overman, RN as Oncology Nurse Navigator Thomasene Ripple, RN as Case Manager (General Practice) Pickenpack-Cousar, Arty Baumgartner, NP as Nurse Practitioner (Nurse Practitioner)   INTERVAL HISTORY: Philip [name] is a 67 y.o. male with oncologic medical history including recurrent non-small cell lung cancer (01/2019 then again 12/2020). Past medical history also includes COPD, BPH, tobacco use, PE, and GERD. Palliative ask to see for symptom management and goals of care.   SOCIAL HISTORY:     reports that he quit smoking about 9 months ago. His smoking use included cigarettes. He started smoking about 52 years ago. He has a 102.8 pack-year smoking history. He has quit using smokeless tobacco.  His smokeless tobacco use included chew. He reports current alcohol use. He reports that he does not currently use drugs after having used the following drugs: Marijuana.  ADVANCE DIRECTIVES:  None on file   CODE STATUS: Full code  PAST MEDICAL HISTORY: Past Medical History:  Diagnosis Date   COPD (chronic obstructive pulmonary disease) (HCC)    Dyspnea    GERD (gastroesophageal reflux disease)    Headache    migraines   Hyperlipidemia    lung ca dx'd 12/2018   On home oxygen therapy    3 to 10 L   Pneumonia    Pulmonary embolus (HCC)    Tobacco abuse     ALLERGIES:  is allergic to carboplatin, klonopin [clonazepam], and norco [hydrocodone-acetaminophen].  MEDICATIONS:  Current Outpatient Medications  Medication Sig Dispense Refill   albuterol (PROVENTIL) (2.5 MG/3ML) 0.083% nebulizer solution Take 3 mLs (2.5 mg total) by nebulization every 4 (four) hours as needed for wheezing or shortness of breath. 75 mL 2   albuterol (VENTOLIN HFA) 108  (90 Base) MCG/ACT inhaler Inhale 2 puffs into the lungs every 4 (four) hours as needed for wheezing or shortness of breath. 18 g 11   BREZTRI AEROSPHERE 160-9-4.8 MCG/ACT AERO INHALE 2 PUFFS INTO THE LUNGS IN THE MORNING AND AT BEDTIME. 10.7 each 3   cetirizine (ZYRTEC) 10 MG tablet Take 10 mg by mouth daily.     dextromethorphan-guaiFENesin (MUCINEX DM) 30-600 MG 12hr tablet Take 1 tablet by mouth 2 (two) times daily as needed for cough. 40 tablet 0   folic acid (FOLVITE) 1 MG tablet TAKE 1 TABLET BY MOUTH EVERY DAY (Patient not taking: Reported on 09/21/2022) 90 tablet 1   furosemide (LASIX) 20 MG tablet Take 1 tablet (20 mg total) by mouth daily. 30 tablet 6   gabapentin (NEURONTIN) 300 MG capsule Take 1 capsule (300 mg total) by mouth 2 (two) times daily. (Patient not taking: Reported on 09/21/2022) 60 capsule 1   HYDROcodone-acetaminophen (NORCO/VICODIN) 5-325 MG tablet Take 1 tablet by mouth every 12 (twelve) hours as needed for moderate pain. (Patient not taking: Reported on 09/21/2022) 45 tablet 0   midodrine (PROAMATINE) 5 MG tablet TAKE 1 TABLET (5 MG TOTAL) BY MOUTH 3 (THREE) TIMES DAILY WITH MEALS. 270 tablet 1   Multiple Vitamins-Minerals (MULTI ADULT GUMMIES) CHEW Chew 1 tablet by mouth daily.     naphazoline-pheniramine (VISINE) 0.025-0.3 % ophthalmic solution Place 1 drop into both eyes daily as needed for eye irritation.     pantoprazole (PROTONIX) 40 MG tablet TAKE 1 TABLET BY MOUTH EVERY DAY  90 tablet 1   predniSONE (DELTASONE) 10 MG tablet Take 1 tablet (10 mg total) by mouth daily with breakfast. 90 tablet 3   tamsulosin (FLOMAX) 0.4 MG CAPS capsule Take 0.4 mg by mouth at bedtime.     XARELTO 20 MG TABS tablet TAKE 1 TABLET BY MOUTH DAILY WITH SUPPER. 30 tablet 11   No current facility-administered medications for this visit.    VITAL SIGNS: There were no vitals taken for this visit. There were no vitals filed for this visit.  Estimated body mass index is 32.11 kg/m as  calculated from the following:   Height as of 07/30/22: 5\' 7"  (1.702 m).   Weight as of 07/30/22: 205 lb (93 kg).   PERFORMANCE STATUS (ECOG) : 1 - Symptomatic but completely ambulatory  Assessment NAD, in wheelchair Tachycardic Dyspneic, on facemask oxygen AAO x3  IMPRESSION: Philip [name] presents to clinic for follow-up. He is in a wheelchair. Facemask oxygen in place. States his nasal cannula was not long enough. Portable pulse on finger. He reports use on average 3-4 liters of oxygen in the home at rest however with exertion and movement he maximizes use with 15L. His oxygen will decrease into the low 80s at times and quickly recover at rest. Recently seen by Pulmonologist and shares plans for stent exchange on 9/9.   Denies nausea, vomiting, constipation, or diarrhea. Is taking life one day at a time. He speaks on his health challenges and changes since our last visit. He is trying to remain hopeful as his goal is to allow himself an opportunity to thrive for as long as he can.   [name] endorses back and ureter pain. Some days are worst than others. Pain is worst first thing in the morning and in the late evening/night hours. Currently taking hydrocodone as needed. Does not require daily. Is taking responsibly as evident by refill request.   We will continue to support as needed. Patient knows to contact office. Ongoing goals of care discussions.   PLAN:  Hydrocodone 5/325 mg one tablet every 12 hours as needed for moderate to severe pain.  Pain contract completed on 2/7 Ongoing goals of care and symptom management support.  I will plan to see patient back in 4-6 weeks in collaboration to other oncology appointments.    Patient expressed understanding and was in agreement with this plan. He also understands that He can call the clinic at any time with any questions, concerns, or complaints.   Any controlled substances utilized were prescribed in the context of palliative care. PDMP has  been reviewed.    Visit consisted of counseling and education dealing with the complex and emotionally intense issues of symptom management and palliative care in the setting of serious and potentially life-threatening illness.Greater than 50%  of this time was spent counseling and coordinating care related to the above assessment and plan.  Willette Alma, AGPCNP-BC  Palliative Medicine Team/[facility]  *Please note that this is a verbal dictation therefore any spelling or grammatical errors are due to the "[employer]" system interpretation.

## 2022-09-30 NOTE — Progress Notes (Signed)
Synopsis: Referred in January 2020 for pulmonary MAC by Farris Has, MD.  Previously seen by Dr. Erick Colace.  Previously seen by Dr. Tonia Brooms.  Subjective:   PATIENT ID: Philip [name] GENDER: male DOB: [birth year], MRN: [id number]  Chief Complaint  Patient presents with   surgical clearance    Mr. Connelley is a 67 y.o.gentleman with a history of stage III lung adenocarcinoma, COPD With worsened symptoms recently over last several weeks now severe hypoxemia on up to 8 L nasal cannula.    Dyspnea largely unchanged.  Oxygenation has improved.  At last visit we did start Lasix 20 mg daily.  He took this for about 6-week.  Recently, mild increase in kidney function etc.  Suspect this is the reason for mild improvement hypoxemia.  Using 2 to 3 L at rest, 5 L with exertion.  He is to continue prednisone 10 mg daily.  Helps keep congestion at bay.  Still has some productive cough but otherwise thinks it is help.  Not sure it is helped his breathing very much.  Discussed as needed Lasix dosing as described below.       Past Medical History:  Diagnosis Date   COPD (chronic obstructive pulmonary disease) (HCC)    Dyspnea    GERD (gastroesophageal reflux disease)    Headache    migraines   Hyperlipidemia    lung ca dx'd 12/2018   On home oxygen therapy    3 to 10 L   Pneumonia    Pulmonary embolus (HCC)    Tobacco abuse      Family History  Problem Relation Age of Onset   Cancer Father    Stomach cancer Father      Past Surgical History:  Procedure Laterality Date   COLONOSCOPY     CYSTOSCOPY W/ URETERAL STENT PLACEMENT Right 10/24/2021   Procedure: CYSTOSCOPY WITH RETROGRADE PYELOGRAM/URETERAL STENT PLACEMENT;  Surgeon: Jannifer Hick, MD;  Location: [facility];  Service: Urology;  Laterality: Right;   HAND SURGERY     VASECTOMY     VIDEO BRONCHOSCOPY WITH ENDOBRONCHIAL ULTRASOUND N/A 12/22/2018   Procedure: VIDEO BRONCHOSCOPY WITH ENDOBRONCHIAL ULTRASOUND;  Surgeon: Josephine Igo, DO;  Location: [facility];  Service: Thoracic;  Laterality: N/A;   VIDEO BRONCHOSCOPY WITH ENDOBRONCHIAL ULTRASOUND N/A 01/09/2019   Procedure: VIDEO BRONCHOSCOPY WITH ENDOBRONCHIAL ULTRASOUND WITH FLUORO;  Surgeon: Josephine Igo, DO;  Location: [facility];  Service: Thoracic;  Laterality: N/A;   VIDEO BRONCHOSCOPY WITH RADIAL ENDOBRONCHIAL ULTRASOUND N/A 12/22/2018   Procedure: RADIAL ENDOBRONCHIAL ULTRASOUND;  Surgeon: Josephine Igo, DO;  Location: [facility];  Service: Thoracic;  Laterality: N/A;    Social History   Socioeconomic History   Marital status: Married    Spouse name: Not on file   Number of children: Not on file   Years of education: Not on file   Highest education level: Not on file  Occupational History   Not on file  Tobacco Use   Smoking status: Former    Current packs/day: 0.00    Average packs/day: 2.0 packs/day for 51.4 years (102.8 ttl pk-yrs)    Types: Cigarettes    Start date: 07/04/1970    Quit date: 12/2021    Years since quitting: 0.8   Smokeless tobacco: Former    Types: Chew   Tobacco comments:    Smoking 2 packs of cigarettes a week. 10/14/2021 Tay    Smoking is on and off, will go a week without smoking then start  up and smoke half a pack a day  Vaping Use   Vaping status: Never Used  Substance and Sexual Activity   Alcohol use: Yes    Comment: hasn't drank in one month   Drug use: Not Currently    Types: Marijuana   Sexual activity: Not on file  Other Topics Concern   Not on file  Social History Narrative   Not on file   Social Determinants of Health   Financial Resource Strain: Not on file  Food Insecurity: No Food Insecurity (01/12/2022)   Hunger Vital Sign    Worried About Running Out of Food in the Last Year: Never true    Ran Out of Food in the Last Year: Never true  Transportation Needs: No Transportation Needs (01/12/2022)   PRAPARE - Administrator, Civil Service (Medical): No    Lack of Transportation  (Non-Medical): No  Physical Activity: Not on file  Stress: Not on file  Social Connections: Not on file  Intimate Partner Violence: Not At Risk (01/12/2022)   Humiliation, Afraid, Rape, and Kick questionnaire    Fear of Current or Ex-Partner: No    Emotionally Abused: No    Physically Abused: No    Sexually Abused: No     Allergies  Allergen Reactions   Carboplatin Anaphylaxis, Shortness Of Breath, Cough and Hypertension    On dose 8. Became short of breath. Symptom management was called. Received benadryl, solumedrol, Pepcid and fluids. Medication discontinued.    Klonopin [Clonazepam] Other (See Comments)    nervous   Norco [Hydrocodone-Acetaminophen] Other (See Comments)    Made pt feel jittery      Immunization History  Administered Date(s) Administered   Influenza Split 11/20/2008   Influenza,inj,Quad PF,6+ Mos 10/24/2018, 11/08/2018, 01/15/2020   Influenza,inj,quad, With Preservative 03/15/2015   PFIZER(Purple Top)SARS-COV-2 Vaccination 04/27/2019, 05/11/2019, 12/07/2019, 12/26/2019   PNEUMOCOCCAL CONJUGATE-20 05/12/2021   Pneumococcal Polysaccharide-23 12/12/2007, 10/10/2019   Td 05/18/2014   Tdap 11/16/2006, 05/18/2014    Outpatient Medications Prior to Visit  Medication Sig Dispense Refill   albuterol (PROVENTIL) (2.5 MG/3ML) 0.083% nebulizer solution Take 3 mLs (2.5 mg total) by nebulization every 4 (four) hours as needed for wheezing or shortness of breath. 75 mL 2   albuterol (VENTOLIN HFA) 108 (90 Base) MCG/ACT inhaler Inhale 2 puffs into the lungs every 4 (four) hours as needed for wheezing or shortness of breath. 18 g 11   BREZTRI AEROSPHERE 160-9-4.8 MCG/ACT AERO INHALE 2 PUFFS INTO THE LUNGS IN THE MORNING AND AT BEDTIME. 10.7 each 3   cetirizine (ZYRTEC) 10 MG tablet Take 10 mg by mouth daily.     dextromethorphan-guaiFENesin (MUCINEX DM) 30-600 MG 12hr tablet Take 1 tablet by mouth 2 (two) times daily as needed for cough. 40 tablet 0    HYDROcodone-acetaminophen (NORCO/VICODIN) 5-325 MG tablet Take 1 tablet by mouth every 12 (twelve) hours as needed for moderate pain. 45 tablet 0   midodrine (PROAMATINE) 5 MG tablet TAKE 1 TABLET (5 MG TOTAL) BY MOUTH 3 (THREE) TIMES DAILY WITH MEALS. 270 tablet 1   Multiple Vitamins-Minerals (MULTI ADULT GUMMIES) CHEW Chew 1 tablet by mouth daily.     naphazoline-pheniramine (VISINE) 0.025-0.3 % ophthalmic solution Place 1 drop into both eyes daily as needed for eye irritation.     pantoprazole (PROTONIX) 40 MG tablet TAKE 1 TABLET BY MOUTH EVERY DAY 90 tablet 1   predniSONE (DELTASONE) 10 MG tablet Take 1 tablet (10 mg total) by mouth daily with breakfast.  90 tablet 3   tamsulosin (FLOMAX) 0.4 MG CAPS capsule Take 0.4 mg by mouth at bedtime.     XARELTO 20 MG TABS tablet TAKE 1 TABLET BY MOUTH DAILY WITH SUPPER. 30 tablet 11   furosemide (LASIX) 20 MG tablet Take 1 tablet (20 mg total) by mouth daily. 30 tablet 6   folic acid (FOLVITE) 1 MG tablet TAKE 1 TABLET BY MOUTH EVERY DAY (Patient not taking: Reported on 09/21/2022) 90 tablet 1   gabapentin (NEURONTIN) 300 MG capsule Take 1 capsule (300 mg total) by mouth 2 (two) times daily. (Patient not taking: Reported on 09/21/2022) 60 capsule 1   No facility-administered medications prior to visit.   Review of systems: N/AA   Objective:   Vitals:   09/30/22 1334  BP: 100/78  Pulse: (!) 106  Temp: 98.2 F (36.8 C)  TempSrc: Oral  SpO2: 96%  Weight: 207 lb 6.4 oz (94.1 kg)  Height: 5\' 6"  (1.676 m)   96% on   RA BMI Readings from Last 3 Encounters:  09/30/22 33.48 kg/m  09/30/22 33.09 kg/m  07/30/22 32.11 kg/m   Wt Readings from Last 3 Encounters:  09/30/22 207 lb 6.4 oz (94.1 kg)  07/30/22 205 lb (93 kg)  06/25/22 202 lb 4 oz (91.7 kg)   Physical exam General: Sitting in wheelchair, chronically ill-appearing Pulmonary: Relatively clear but distant, normal work of breathing on oxygen Neuro: No focal deficits, sensation  appears intact Cardiovascular: Pitting edema to just above ankle bilaterally, warm Psych: Normal mood, full affect   CBC    Component Value Date/Time   WBC 9.2 09/25/2022 1119   WBC 13.2 (H) 01/20/2022 0645   RBC 4.75 09/25/2022 1119   HGB 14.1 09/25/2022 1119   HCT 42.3 09/25/2022 1119   PLT 241 09/25/2022 1119   MCV 89.1 09/25/2022 1119   MCH 29.7 09/25/2022 1119   MCHC 33.3 09/25/2022 1119   RDW 14.0 09/25/2022 1119   LYMPHSABS 0.8 09/25/2022 1119   MONOABS 0.7 09/25/2022 1119   EOSABS 0.2 09/25/2022 1119   BASOSABS 0.1 09/25/2022 1119    CHEMISTRY Recent Labs  Lab 09/25/22 1119  NA 139  K 4.0  CL 101  CO2 30  GLUCOSE 150*  BUN 20  CREATININE 1.39*  CALCIUM 9.1   Estimated Creatinine Clearance: 55.4 mL/min (A) (by C-G formula based on SCr of 1.39 mg/dL (H)).   Chest Imaging- films reviewed: CTA PE protocol 01/2021 with fibrotic changes around initial mass and enlarging nodular mass over 2 cm  CT PET 01/03/2019-severe emphysema, right hilar mass with air bronchograms, medial RLL cystic fibrotic changes.  PET avidity of the inferior portion of the mass and medial fibrotic areas of the lower lobe.  CT chest with contrast 04/10/2018-persistent right mass, unchanged right paraseptal emphysema versus cavitary lesions.  Improved adenopathy.  CT chest with contrast 10/06/2019-bilateral centrilobular and paraseptal emphysema.  Increased opacification of right lower lobe cystic area in the medial base compared to March 2021, but improved compared to June 2021 CT.  Persistence of hilar mass on the right.  New irregularly shaped opacity in the left upper lobe.  Increased areas of patchy groundglass and septal thickening in the right upper lobe.  Micro: 01/09/2019 AFB: Component 1 mo ago  Organism ID [id number]    Comment: Mycobacterium avium complex  Amikacin Comment   Comment: 16.0 ug/mL Susceptible  Clarithromycin 2.0 ug/mL Susceptible   Linezolid Comment   Comment:  16.0 ug/mL Intermediate  Moxifloxacin 4.0 ug/mL Resistant  Streptomycin 64.0 ug/mL    01/09/2019 fungus culture-negative 01/09/2019 BAL-many PMN, negative culture 06/17/2019 respiratory culture-normal flora  Pulmonary Functions Testing Results:    Latest Ref Rng & Units 12/21/2018   12:38 PM  PFT Results  FVC-Pre L 3.07   FVC-Predicted Pre % 76   FVC-Post L 3.23   FVC-Predicted Post % 80   Pre FEV1/FVC % % 67   Post FEV1/FCV % % 71   FEV1-Pre L 2.07   FEV1-Predicted Pre % 68   FEV1-Post L 2.28   DLCO uncorrected ml/min/mmHg 15.47   DLCO UNC% % 64   DLVA Predicted % 79   TLC L 5.35   TLC % Predicted % 86   RV % Predicted % 104    2020- mild obstruction without significant bronchodilator reversibility.  No significant restriction, air trapping, or hyperinflation.  Mildly reduced diffusion.  Flow volume loop supports obstruction.  Pathology 01/09/2019:  RLL adenocarcinoma November 2020 bronch nondiagnostic     Assessment & Plan:   No diagnosis found.   Chronic hypoxemic respiratory failure: Intermittent in the past.  Persistent since hospitalization pneumonia 01/2022.  Gradually worsening over time.  He has no reserve with his significant emphysema and radiation fibrosis, lung cancer.  I do not anticipate this will get much better.  Discussed anticipated will eventually continue to worsen.  Fortunate, with some improvement in oxygenation with initiation of Lasix.  See below. -- Goal O2 sats 88% to 90% -- Continue Oxymizer and nonrebreather as needed  Lung adenocarcinoma- stage IIIc (T4N3M0).  Previously received carboplatin, paclitaxel, and radiation from Dec 2020-  mid March 2021, imfimzi 02 May 2019.  Unfortunate recurrence fall 2022 now on maintenance chemotherapy, carboplatin stopped after second cycle given anaphylaxis. -Ongoing management per oncology.   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 Resistant to quinolones; intermediate susceptibility to linezolid.  Low suspicion for active disease. - Traditionally, would avoid ICS but given his ongoing symptoms of shortness of breath, okay to trial and if no improvement return to using Stiolto -PLEASE AVOID MONOTHERAPY WITH MACROLIDES DUE TO RISK OF INDUCIBLE RESISTANCE OF MAI.  Pulmonary embolus:  -Provoked in setting malignancy.  Xarelto to continue indefinitely in the setting of malignancy.    Lower extremity swelling: -- Lasix 20 mg daily as needed, for greater than 2 pound weight gain in 24 hours or 5 pounds or more weight gain over 1 week, or based on lower extremity appearance.  Preoperative evaluation: Pulmonary medicine does not provide preoperative clearance with her preoperative risk assessment.  Based on the ARISCAT model, patient is low or 1.6% risk of postoperative pulmonary complication if duration of surgery is less than 3 hours.  If duration of surgery is greater than 3 hours patient is intermediate or 13.3% risk of postoperative pulmonary complication.  There are no modifiable risk factors to address prior to surgery.  Frankly, his hypoxemia is better than it has been in months.  RTC in 3 months with Dr. Judeth Horn.     Current Outpatient Medications:    albuterol (PROVENTIL) (2.5 MG/3ML) 0.083% nebulizer solution, Take 3 mLs (2.5 mg total) by nebulization every 4 (four) hours as needed for wheezing or shortness of breath., Disp: 75 mL, Rfl: 2   albuterol (VENTOLIN HFA) 108 (90 Base) MCG/ACT inhaler, Inhale 2 puffs into the lungs every 4 (four) hours as needed for wheezing or shortness of breath.,  Disp: 18 g, Rfl: 11   BREZTRI AEROSPHERE 160-9-4.8 MCG/ACT AERO, INHALE 2 PUFFS INTO THE LUNGS IN THE MORNING AND AT BEDTIME., Disp: 10.7 each, Rfl: 3   cetirizine (ZYRTEC) 10 MG tablet, Take 10 mg by mouth daily., Disp: , Rfl:    dextromethorphan-guaiFENesin (MUCINEX DM) 30-600 MG  12hr tablet, Take 1 tablet by mouth 2 (two) times daily as needed for cough., Disp: 40 tablet, Rfl: 0   HYDROcodone-acetaminophen (NORCO/VICODIN) 5-325 MG tablet, Take 1 tablet by mouth every 12 (twelve) hours as needed for moderate pain., Disp: 45 tablet, Rfl: 0   midodrine (PROAMATINE) 5 MG tablet, TAKE 1 TABLET (5 MG TOTAL) BY MOUTH 3 (THREE) TIMES DAILY WITH MEALS., Disp: 270 tablet, Rfl: 1   Multiple Vitamins-Minerals (MULTI ADULT GUMMIES) CHEW, Chew 1 tablet by mouth daily., Disp: , Rfl:    naphazoline-pheniramine (VISINE) 0.025-0.3 % ophthalmic solution, Place 1 drop into both eyes daily as needed for eye irritation., Disp: , Rfl:    pantoprazole (PROTONIX) 40 MG tablet, TAKE 1 TABLET BY MOUTH EVERY DAY, Disp: 90 tablet, Rfl: 1   predniSONE (DELTASONE) 10 MG tablet, Take 1 tablet (10 mg total) by mouth daily with breakfast., Disp: 90 tablet, Rfl: 3   tamsulosin (FLOMAX) 0.4 MG CAPS capsule, Take 0.4 mg by mouth at bedtime., Disp: , Rfl:    XARELTO 20 MG TABS tablet, TAKE 1 TABLET BY MOUTH DAILY WITH SUPPER., Disp: 30 tablet, Rfl: 11   furosemide (LASIX) 20 MG tablet, Take 1 tablet (20 mg total) by mouth daily as needed., Disp: , Rfl:   I spent 41 minutes care of patient including face-to-face visit, review of records, coordination of care.  Karren Burly,  MD [facility] 09/30/2022 1:56 PM

## 2022-09-30 NOTE — Progress Notes (Signed)
[facility] [phone]   [phone]  OFFICE PROGRESS NOTE  Farris Has, MD [address] [place] [address]  DIAGNOSIS:  1) recurrent non-small cell lung cancer initially diagnosed as stage IIIc (T4, N3, M0) non-small cell lung cancer, adenocarcinoma presented with large right hilar mass in addition to bilateral hilar and mediastinal lymphadenopathy diagnosed in December 2020.  The patient has evidence for disease recurrence in October 2022. 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  Started on Xarelto on March 17, 2020 and currently 20 mg p.o. daily.   PRIOR THERAPY: 1) Weekly concurrent chemoradiation with carboplatin for an AUC of 2 and paclitaxel 45 mg/m2. First dose starting 01/30/2019.   Status post 6 cycles. 2) Consolidation immunotherapy with Imfinzi 1500 mg IV every 4 weeks. First dose expected on 04/19/2019.  Status post 13 cycles. 3) disease recurrence in October 2022. 4) Systemic chemotherapy with carboplatin for AUC of 5, Alimta 500 Mg/M2 and Keytruda 200 Mg IV every 3 weeks.  First dose January 01, 2021.  Status post 21 cycles.  He had hypersensitivity reaction to carboplatin and this was discontinued after cycle #2.  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 Treatment has been on hold since 04/02/22 due to other health conditions.   CURRENT THERAPY: Observation  INTERVAL HISTORY: Philip [name] 67 y.o. male returns to the clinic today for follow-up visit accompanied by his wife.  The patient is feeling fine today with no concerning complaints except for the baseline fatigue and shortness of breath and he is currently on home oxygen.  He has no recent chest pain but has mild cough with no hemoptysis.  He has no nausea, vomiting, diarrhea or constipation.   He has no headache or visual changes.  He has no recent weight loss or night sweats.  He is here today for evaluation with repeat CT scan of the chest for restaging of his disease.   MEDICAL HISTORY: Past Medical History:  Diagnosis Date   COPD (chronic obstructive pulmonary disease) (HCC)    Dyspnea    GERD (gastroesophageal reflux disease)    Headache    migraines   Hyperlipidemia    lung ca dx'd 12/2018   On home oxygen therapy    3 to 10 L   Pneumonia    Pulmonary embolus (HCC)    Tobacco abuse     ALLERGIES:  is allergic to carboplatin, klonopin [clonazepam], and norco [hydrocodone-acetaminophen].  MEDICATIONS:  Current Outpatient Medications  Medication Sig Dispense Refill   albuterol (PROVENTIL) (2.5 MG/3ML) 0.083% nebulizer solution Take 3 mLs (2.5 mg total) by nebulization every 4 (four) hours as needed for wheezing or shortness of breath. 75 mL 2   albuterol (VENTOLIN HFA) 108 (90 Base) MCG/ACT inhaler Inhale 2 puffs into the lungs every 4 (four) hours as needed for wheezing or shortness of breath. 18 g 11   BREZTRI AEROSPHERE 160-9-4.8 MCG/ACT AERO INHALE 2 PUFFS INTO THE LUNGS IN THE MORNING AND AT BEDTIME. 10.7 each 3   cetirizine (ZYRTEC) 10 MG tablet Take 10 mg by mouth daily.     dextromethorphan-guaiFENesin (MUCINEX DM) 30-600 MG 12hr tablet Take 1 tablet by mouth 2 (two) times daily as needed for cough. 40 tablet 0   furosemide (LASIX) 20 MG tablet Take 1 tablet (20 mg total) by  mouth daily as needed.     HYDROcodone-acetaminophen (NORCO/VICODIN) 5-325 MG tablet Take 1 tablet by mouth every 12 (twelve) hours as needed for moderate pain. 45 tablet 0   midodrine (PROAMATINE) 5 MG tablet TAKE 1 TABLET (5 MG TOTAL) BY MOUTH 3 (THREE) TIMES DAILY WITH MEALS. 270 tablet 1   Multiple Vitamins-Minerals (MULTI ADULT GUMMIES) CHEW Chew 1 tablet by mouth daily.     naphazoline-pheniramine (VISINE) 0.025-0.3 % ophthalmic solution Place 1 drop into both eyes daily as needed for  eye irritation.     pantoprazole (PROTONIX) 40 MG tablet TAKE 1 TABLET BY MOUTH EVERY DAY 90 tablet 1   predniSONE (DELTASONE) 10 MG tablet Take 1 tablet (10 mg total) by mouth daily with breakfast. 90 tablet 3   tamsulosin (FLOMAX) 0.4 MG CAPS capsule Take 0.4 mg by mouth at bedtime.     XARELTO 20 MG TABS tablet TAKE 1 TABLET BY MOUTH DAILY WITH SUPPER. 30 tablet 11   No current facility-administered medications for this visit.    SURGICAL HISTORY:  Past Surgical History:  Procedure Laterality Date   COLONOSCOPY     CYSTOSCOPY W/ URETERAL STENT PLACEMENT Right 10/24/2021   Procedure: CYSTOSCOPY WITH RETROGRADE PYELOGRAM/URETERAL STENT PLACEMENT;  Surgeon: Jannifer Hick, MD;  Location: [facility];  Service: Urology;  Laterality: Right;   HAND SURGERY     VASECTOMY     VIDEO BRONCHOSCOPY WITH ENDOBRONCHIAL ULTRASOUND N/A 12/22/2018   Procedure: VIDEO BRONCHOSCOPY WITH ENDOBRONCHIAL ULTRASOUND;  Surgeon: Josephine Igo, DO;  Location: [facility];  Service: Thoracic;  Laterality: N/A;   VIDEO BRONCHOSCOPY WITH ENDOBRONCHIAL ULTRASOUND N/A 01/09/2019   Procedure: VIDEO BRONCHOSCOPY WITH ENDOBRONCHIAL ULTRASOUND WITH FLUORO;  Surgeon: Josephine Igo, DO;  Location: [facility];  Service: Thoracic;  Laterality: N/A;   VIDEO BRONCHOSCOPY WITH RADIAL ENDOBRONCHIAL ULTRASOUND N/A 12/22/2018   Procedure: RADIAL ENDOBRONCHIAL ULTRASOUND;  Surgeon: Josephine Igo, DO;  Location: [facility];  Service: Thoracic;  Laterality: N/A;    REVIEW OF SYSTEMS:  A comprehensive review of systems was negative except for: Constitutional: positive for fatigue Respiratory: positive for cough and dyspnea on exertion Musculoskeletal: positive for muscle weakness   PHYSICAL EXAMINATION: General appearance: alert, cooperative, fatigued, and no distress Head: Normocephalic, without obvious abnormality, atraumatic Neck: no adenopathy, no JVD, supple, symmetrical, trachea midline, and thyroid not enlarged, symmetric, no  tenderness/mass/nodules Lymph nodes: Cervical, supraclavicular, and axillary nodes normal. Resp: rales bilaterally and wheezes bilaterally Back: symmetric, no curvature. ROM normal. No CVA tenderness. Cardio: regular rate and rhythm, S1, S2 normal, no murmur, click, rub or gallop GI: soft, non-tender; bowel sounds normal; no masses,  no organomegaly Extremities: extremities normal, atraumatic, no cyanosis or edema  ECOG PERFORMANCE STATUS: 1 - Symptomatic but completely ambulatory  There were no vitals taken for this visit.  LABORATORY DATA: Lab Results  Component Value Date   WBC 9.2 09/25/2022   HGB 14.1 09/25/2022   HCT 42.3 09/25/2022   MCV 89.1 09/25/2022   PLT 241 09/25/2022      Chemistry      Component Value Date/Time   NA 139 09/25/2022 1119   K 4.0 09/25/2022 1119   CL 101 09/25/2022 1119   CO2 30 09/25/2022 1119   BUN 20 09/25/2022 1119   CREATININE 1.39 (H) 09/25/2022 1119      Component Value Date/Time   CALCIUM 9.1 09/25/2022 1119   ALKPHOS 79 09/25/2022 1119   AST 16 09/25/2022 1119   ALT 17 09/25/2022 1119  BILITOT 0.5 09/25/2022 1119       RADIOGRAPHIC STUDIES: CT CHEST ABDOMEN PELVIS W CONTRAST  Result Date: 09/29/2022 CLINICAL DATA:  Metastatic lung cancer restaging cough, shortness of breath * Tracking Code: BO * EXAM: CT CHEST, ABDOMEN, AND PELVIS WITH CONTRAST TECHNIQUE: Multidetector CT imaging of the chest, abdomen and pelvis was performed following the standard protocol during bolus administration of intravenous contrast. RADIATION DOSE REDUCTION: This exam was performed according to the departmental dose-optimization program which includes automated exposure control, adjustment of the mA and/or kV according to patient size and/or use of iterative reconstruction technique. CONTRAST:  OMNIPAQUE IOHEXOL 300 MG/ML SOLN, additional oral enteric contrast COMPARISON:  06/19/2022 FINDINGS: CT CHEST FINDINGS Cardiovascular: Aortic atherosclerosis.  Normal heart size. Left and right coronary artery calcifications no pericardial effusion. Mediastinum/Nodes: No enlarged mediastinal, hilar, or axillary lymph nodes. Thyroid gland, trachea, and esophagus demonstrate no significant findings. Lungs/Pleura: Similar small, loculated right pleural effusion with associated scarring and fibrosis of the perihilar and infrahilar right lung. Additional unchanged perihilar fibrosis and consolidation of the left lung (series 6, image 60). Unchanged fibrosis of the dependent left lung (series 6, image 101). Unchanged, underlying severe emphysema. Musculoskeletal: No chest wall abnormality. No acute osseous findings. CT ABDOMEN PELVIS FINDINGS Hepatobiliary: No solid liver abnormality is seen. No gallstones, gallbladder wall thickening, or biliary dilatation. Pancreas: Unremarkable. No pancreatic ductal dilatation or surrounding inflammatory changes. Spleen: Normal in size without significant abnormality. Adrenals/Urinary Tract: Unchanged, macroscopic fat containing benign left adrenal adenoma measuring 2.5 x 2.0 cm (series 2, image 66). Unchanged, benign non nodular adenomatous thickening of the right adrenal. No further follow-up or characterization is required. Unchanged right-sided double-J ureteral stent catheter with formed pigtails and no significant residual hydronephrosis. Left kidney is normal, without renal calculi, solid lesion, or hydronephrosis. Bladder is unremarkable. Stomach/Bowel: Stomach is within normal limits. Appendix appears normal. No evidence of bowel wall thickening, distention, or inflammatory changes. Vascular/Lymphatic: Aortic atherosclerosis. No enlarged abdominal or pelvic lymph nodes. Reproductive: No mass or other abnormality. Other: No abdominal wall hernia or abnormality. No ascites. Musculoskeletal: No acute osseous findings. IMPRESSION: 1. Unchanged post treatment appearance of the lungs, with perihilar and infrahilar fibrosis of the right  lung and perihilar fibrosis of the left lung. Similar small, loculated right pleural effusion. 2. Unchanged severe emphysema and underlying pulmonary fibrosis. 3. No evidence of lymphadenopathy or metastatic disease in the chest, abdomen, or pelvis. 4. Unchanged right-sided double-J ureteral stent catheter with formed pigtails and no significant residual hydronephrosis. 5. Coronary artery disease Aortic Atherosclerosis (ICD10-I70.0) and Emphysema (ICD10-J43.9). Electronically Signed   By: Jearld Lesch M.D.   On: 09/29/2022 14:26     ASSESSMENT AND PLAN: This is a very pleasant 67 years old white male with metastatic non-small cell lung cancer that was initially diagnosed as stage IIIc non-small cell lung cancer, adenocarcinoma diagnosed in December 2020 The patient completed a course of concurrent chemoradiation with weekly carboplatin and paclitaxel status post 6 cycles and he tolerated his treatment well and has partial response. He underwent consolidation treatment with immunotherapy with Imfinzi 1500 mg IV every 4 weeks status post 13 cycles. He was on observation and feeling fine with no concerning complaints except for mild cough as well as the left shoulder pain. His scan showed increase in the size of the soft tissue nodule in the right middle lobe adjacent to the postradiation changes highly suspicious for recurrent disease.  There was also soft tissue of the right hilum/as ago esophageal recess  unchanged at but increased compared to remote prior studies. The patient had a PET scan that showed hypermetabolic nodule in the right lower lobe adjacent to the post radiation treatment site increased in size and consistent with lung cancer recurrence.  There was second hypermetabolic nodule in the right lower lobe above the diaphragm concerning for recurrence and hypermetabolic contralateral lymph node in the high left paratracheal mediastinum also concerning for metastatic adenopathy recurrence. He  started systemic chemotherapy with carboplatin for AUC of 5, Alimta 500 Mg/M2 and Keytruda 200 Mg IV every 3 weeks on January 01, 2021.   Carboplatin was discontinued with cycle #2 secondary to hypersensitivity reaction.  The patient is status post 21 cycles.  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   He was treated with single agent Alimta and this has been on hold since February 2024. The patient is currently on observation and feeling fine with no concerning complaints except for the baseline shortness of breath and fatigue. He had repeat CT scan of the chest, abdomen and pelvis performed recently.  I personally and independently reviewed the scan and discussed the result with the patient and his wife. His scan showed no concerning findings for disease progression. I recommended for him to continue on observation with repeat CT scan of the chest, abdomen and pelvis in 3 months. He is expected to have the right ureteral stent removed in the next 1-2 weeks. He was advised to call immediately if he has any other concerning symptoms in the interval. The patient voices understanding of current disease status and treatment options and is in agreement with the current care plan. All questions were answered. The patient knows to call the clinic with any problems, questions or concerns. We can certainly see the patient much sooner if necessary. The total time spent in the appointment was 20 minutes.  Disclaimer: This note was dictated with voice recognition software. Similar sounding words can inadvertently be transcribed and may not be corrected upon review.

## 2022-09-30 NOTE — Patient Instructions (Signed)
Neck to see you again  Okay to take Lasix as needed, look for more than 2 pound weight gain in 24 hours or 5 pound weight gain over 1 week this would be a time to take a dose  Or based on swelling in your legs  No other changes to medications  Return to clinic in 3 months or sooner as needed with Dr. Judeth Horn

## 2022-09-30 NOTE — Telephone Encounter (Signed)
OV notes and clearance form have been faxed back to [facility]. Nothing further needed at this time.  

## 2022-09-30 NOTE — Addendum Note (Signed)
Addended by: Delrae Rend on: 09/30/2022 02:29 PM   Modules accepted: Orders

## 2022-10-01 ENCOUNTER — Encounter (HOSPITAL_COMMUNITY): Payer: Self-pay

## 2022-10-01 NOTE — Progress Notes (Addendum)
Case: [id number] Date/Time: 10/12/22 1315   Procedure: CYSTOSCOPY WITH RIGHT  RETROGRADE PYELOGRAM, POSSIBLE RIGHT URETEROSCOPY, LASER LITHOTRIPSY AND RIGHT STENT EXCHANGE (Right)   Anesthesia type: General   Pre-op diagnosis: RIGHT URETEROPELVIC JUNCTION OBSTRUCTION   Location: [facility]   Surgeons: Jannifer Hick, MD       DISCUSSION: Philip [name] is a 67 yo male who presents to PAT prior to surgery above. PMH significant for former smoking (quit 2023), COPD with chronic respiratory failure (on 3-15L of O2 depending on activity), chronic steroid use, recurrent non-small cell lung cancer s/p chemo, hx of PE (on Xarelto), migraines, GERD, right UPJ obstruction s/p stenting in 10/2021  Patient follows with Oncology for his lung cancer. Last seen on 8/28 for restaging. He was previously been on chemo but this has been held since February 2024 due to other health conditions. Has a mild chronic cough, SOB, and fatigue at baseline. Per Dr. Arbutus Ped "The patient is currently on observation and feeling fine with no concerning complaints except for the baseline shortness of breath and fatigue." "His scan showed no concerning findings for disease progression. I recommended for him to continue on observation with repeat CT scan of the chest, abdomen and pelvis in 3 months."  He follows with Pulmonology for severe COPD with chronic respiratory failure. He is on daily Prednisone and has been taking Lasix daily. Lasix was made prn due to mild AKI. It was noted his dyspnea is unchanged but his oxygenation has improved. Goal O2 sats 88% to 90%. Uses inhalers. Advised f/u in 3 months. Pre-op risk assessment provided:   "Preoperative evaluation: Pulmonary medicine does not provide preoperative clearance with her preoperative risk assessment.  Based on the ARISCAT model, patient is low or 1.6% risk of postoperative pulmonary complication if duration of surgery is less than 3 hours.  If duration of  surgery is greater than 3 hours patient is intermediate or 13.3% risk of postoperative pulmonary complication.  There are no modifiable risk factors to address prior to surgery.  Frankly, his hypoxemia is better than it has been in months."  Patient has been referred to Palliative care due to his multiple co-morbidities. Las seen on 8/28. On Norco prn. "He is trying to remain hopeful as his goal is to allow himself an opportunity to thrive for as long as he can." He is full code.  VS: BP (!) 112/91   Pulse (!) 113   Temp 36.9 C (Oral)   Resp 16   Ht 5\' 6"  (1.676 m)   SpO2 93%   BMI 33.09 kg/m   PROVIDERS: PCP: Farris Has, MD Oncology: Si Gaul, MD Pulmonology: Vilma Meckel, MD  LABS: Labs reviewed: Acceptable for surgery. Mild AKI noted - pt has been using Lasix daily and was advised to only use prn. (all labs ordered are listed, but only abnormal results are displayed)  Labs Reviewed - No data to display   IMAGES:  09/25/22 CT Chest/abd/pelvis   IMPRESSION: 1. Unchanged post treatment appearance of the lungs, with perihilar and infrahilar fibrosis of the right lung and perihilar fibrosis of the left lung. Similar small, loculated right pleural effusion. 2. Unchanged severe emphysema and underlying pulmonary fibrosis. 3. No evidence of lymphadenopathy or metastatic disease in the chest, abdomen, or pelvis. 4. Unchanged right-sided double-J ureteral stent catheter with formed pigtails and no significant residual hydronephrosis. 5. Coronary artery disease   Aortic Atherosclerosis (ICD10-I70.0) and Emphysema (ICD10-J43.9).   EKG:   CV:  Echo 06/17/2019:  IMPRESSIONS     1. Left ventricular ejection fraction, by estimation, is 60 to 65%. The  left ventricle has normal function. The left ventricle has no regional  wall motion abnormalities. Left ventricular diastolic parameters were  normal.   2. Right ventricular systolic function is normal. The  right ventricular  size is normal.   3. The mitral valve is normal in structure. Trivial mitral valve  regurgitation. No evidence of mitral stenosis.   4. The aortic valve is tricuspid. Aortic valve regurgitation is not  visualized. Mild aortic valve sclerosis is present, with no evidence of  aortic valve stenosis.   5. The inferior vena cava is normal in size with greater than 50%  respiratory variability, suggesting right atrial pressure of 3 mmHg.    Past Medical History:  Diagnosis Date   COPD (chronic obstructive pulmonary disease) (HCC)    Dyspnea    GERD (gastroesophageal reflux disease)    Headache    migraines   Hyperlipidemia    lung ca dx'd 12/2018   On home oxygen therapy    3 to 10 L   Pneumonia    Pulmonary embolus (HCC)    Tobacco abuse     Past Surgical History:  Procedure Laterality Date   COLONOSCOPY     CYSTOSCOPY W/ URETERAL STENT PLACEMENT Right 10/24/2021   Procedure: CYSTOSCOPY WITH RETROGRADE PYELOGRAM/URETERAL STENT PLACEMENT;  Surgeon: Jannifer Hick, MD;  Location: [facility];  Service: Urology;  Laterality: Right;   HAND SURGERY     VASECTOMY     VIDEO BRONCHOSCOPY WITH ENDOBRONCHIAL ULTRASOUND N/A 12/22/2018   Procedure: VIDEO BRONCHOSCOPY WITH ENDOBRONCHIAL ULTRASOUND;  Surgeon: Josephine Igo, DO;  Location: [facility];  Service: Thoracic;  Laterality: N/A;   VIDEO BRONCHOSCOPY WITH ENDOBRONCHIAL ULTRASOUND N/A 01/09/2019   Procedure: VIDEO BRONCHOSCOPY WITH ENDOBRONCHIAL ULTRASOUND WITH FLUORO;  Surgeon: Josephine Igo, DO;  Location: [facility];  Service: Thoracic;  Laterality: N/A;   VIDEO BRONCHOSCOPY WITH RADIAL ENDOBRONCHIAL ULTRASOUND N/A 12/22/2018   Procedure: RADIAL ENDOBRONCHIAL ULTRASOUND;  Surgeon: Josephine Igo, DO;  Location: [facility];  Service: Thoracic;  Laterality: N/A;    MEDICATIONS:  albuterol (PROVENTIL) (2.5 MG/3ML) 0.083% nebulizer solution   albuterol (VENTOLIN HFA) 108 (90 Base) MCG/ACT inhaler   BREZTRI AEROSPHERE 160-9-4.8  MCG/ACT AERO   cetirizine (ZYRTEC) 10 MG tablet   dextromethorphan-guaiFENesin (MUCINEX DM) 30-600 MG 12hr tablet   furosemide (LASIX) 20 MG tablet   HYDROcodone-acetaminophen (NORCO/VICODIN) 5-325 MG tablet   midodrine (PROAMATINE) 5 MG tablet   Multiple Vitamins-Minerals (MULTI ADULT GUMMIES) CHEW   naphazoline-pheniramine (VISINE) 0.025-0.3 % ophthalmic solution   pantoprazole (PROTONIX) 40 MG tablet   predniSONE (DELTASONE) 10 MG tablet   tamsulosin (FLOMAX) 0.4 MG CAPS capsule   XARELTO 20 MG TABS tablet   No current facility-administered medications for this encounter.   Marcille Blanco [facility] Phone [phone] 10/01/2022 2:25 PM

## 2022-10-02 ENCOUNTER — Telehealth: Payer: Self-pay | Admitting: Pulmonary Disease

## 2022-10-02 NOTE — Telephone Encounter (Signed)
Zona from [facility] is calling. She states that  patient received surgical clearance but they are unsure if he should hold off on the zerelto for three days.  Phone number [phone] ex [phone]

## 2022-10-04 ENCOUNTER — Other Ambulatory Visit: Payer: Self-pay | Admitting: Nurse Practitioner

## 2022-10-04 DIAGNOSIS — G893 Neoplasm related pain (acute) (chronic): Secondary | ICD-10-CM

## 2022-10-04 DIAGNOSIS — C3490 Malignant neoplasm of unspecified part of unspecified bronchus or lung: Secondary | ICD-10-CM

## 2022-10-04 DIAGNOSIS — Z515 Encounter for palliative care: Secondary | ICD-10-CM

## 2022-10-04 MED ORDER — HYDROCODONE-ACETAMINOPHEN 5-325 MG PO TABS: 1.0000 | ORAL_TABLET | Freq: Two times a day (BID) | ORAL | 0 refills | Status: DC | PRN

## 2022-10-10 NOTE — Anesthesia Preprocedure Evaluation (Signed)
Anesthesia Evaluation  Patient identified by MRN, date of birth, ID band Patient awake    Reviewed: Allergy & Precautions, NPO status , Patient's Chart, lab work & pertinent test results  Airway Mallampati: II  TM Distance: >3 FB Neck ROM: Full    Dental no notable dental hx. (+) Teeth Intact, Dental Advisory Given   Pulmonary COPD,  COPD inhaler and oxygen dependent, former smoker, PE (off xarelto 9/6) Small cell Lung CA S/P chemo  Chronic respiratory failure   Pulmonary exam normal breath sounds clear to auscultation       Cardiovascular + CAD  Normal cardiovascular exam Rhythm:Regular Rate:Normal     Neuro/Psych  Headaches    GI/Hepatic ,GERD  ,,  Endo/Other    Renal/GU Renal InsufficiencyRenal diseaseLab Results      Component                Value               Date                    K                        4.0                 09/25/2022                   CREATININE               1.39 (H)            09/25/2022                     Musculoskeletal   Abdominal   Peds  Hematology Lab Results      Component                Value               Date                      WBC                      9.2                 09/25/2022                HGB                      14.1                09/25/2022                HCT                      42.3                09/25/2022                MCV                      89.1                09/25/2022                PLT  241                 09/25/2022              Anesthesia Other Findings Carboplatin, Klonopin, Norco  Reproductive/Obstetrics                             Anesthesia Physical Anesthesia Plan  ASA: 4  Anesthesia Plan: General   Post-op Pain Management: Ofirmev IV (intra-op)* and Precedex   Induction: Intravenous  PONV Risk Score and Plan: 3 and Treatment may vary due to age or medical condition and  Ondansetron  Airway Management Planned: LMA  Additional Equipment: None  Intra-op Plan:   Post-operative Plan:   Informed Consent: I have reviewed the patients History and Physical, chart, labs and discussed the procedure including the risks, benefits and alternatives for the proposed anesthesia with the patient or authorized representative who has indicated his/her understanding and acceptance.     Dental advisory given  Plan Discussed with: CRNA  Anesthesia Plan Comments:         Anesthesia Quick Evaluation

## 2022-10-12 ENCOUNTER — Ambulatory Visit (HOSPITAL_COMMUNITY): Payer: No Typology Code available for payment source | Admitting: Physician Assistant

## 2022-10-12 ENCOUNTER — Encounter (HOSPITAL_COMMUNITY): Payer: Self-pay | Admitting: Urology

## 2022-10-12 ENCOUNTER — Other Ambulatory Visit: Payer: Self-pay

## 2022-10-12 ENCOUNTER — Ambulatory Visit (HOSPITAL_COMMUNITY): Payer: No Typology Code available for payment source

## 2022-10-12 ENCOUNTER — Ambulatory Visit (HOSPITAL_BASED_OUTPATIENT_CLINIC_OR_DEPARTMENT_OTHER): Payer: No Typology Code available for payment source | Admitting: Anesthesiology

## 2022-10-12 ENCOUNTER — Ambulatory Visit (HOSPITAL_COMMUNITY)
Admission: RE | Admit: 2022-10-12 | Discharge: 2022-10-12 | Disposition: A | Discharge status: Home / Self Care | Payer: No Typology Code available for payment source | Attending: Urology | Admitting: Urology

## 2022-10-12 ENCOUNTER — Encounter (HOSPITAL_COMMUNITY)
Admission: RE | Disposition: A | Discharge status: Home / Self Care | Payer: Self-pay | Source: Home / Self Care | Attending: Urology

## 2022-10-12 DIAGNOSIS — I2510 Atherosclerotic heart disease of native coronary artery without angina pectoris: Secondary | ICD-10-CM | POA: Insufficient documentation

## 2022-10-12 DIAGNOSIS — J449 Chronic obstructive pulmonary disease, unspecified: Secondary | ICD-10-CM

## 2022-10-12 DIAGNOSIS — Z9981 Dependence on supplemental oxygen: Secondary | ICD-10-CM | POA: Diagnosis not present

## 2022-10-12 DIAGNOSIS — Z9221 Personal history of antineoplastic chemotherapy: Secondary | ICD-10-CM | POA: Diagnosis not present

## 2022-10-12 DIAGNOSIS — N131 Hydronephrosis with ureteral stricture, not elsewhere classified: Secondary | ICD-10-CM | POA: Diagnosis present

## 2022-10-12 DIAGNOSIS — Z86711 Personal history of pulmonary embolism: Secondary | ICD-10-CM | POA: Diagnosis not present

## 2022-10-12 DIAGNOSIS — Z7901 Long term (current) use of anticoagulants: Secondary | ICD-10-CM | POA: Insufficient documentation

## 2022-10-12 DIAGNOSIS — Z87891 Personal history of nicotine dependence: Secondary | ICD-10-CM | POA: Diagnosis not present

## 2022-10-12 DIAGNOSIS — E785 Hyperlipidemia, unspecified: Secondary | ICD-10-CM | POA: Diagnosis not present

## 2022-10-12 DIAGNOSIS — Z7951 Long term (current) use of inhaled steroids: Secondary | ICD-10-CM | POA: Insufficient documentation

## 2022-10-12 DIAGNOSIS — J9610 Chronic respiratory failure, unspecified whether with hypoxia or hypercapnia: Secondary | ICD-10-CM | POA: Diagnosis not present

## 2022-10-12 DIAGNOSIS — N135 Crossing vessel and stricture of ureter without hydronephrosis: Secondary | ICD-10-CM

## 2022-10-12 DIAGNOSIS — C3490 Malignant neoplasm of unspecified part of unspecified bronchus or lung: Secondary | ICD-10-CM | POA: Diagnosis not present

## 2022-10-12 DIAGNOSIS — N201 Calculus of ureter: Secondary | ICD-10-CM | POA: Diagnosis not present

## 2022-10-12 HISTORY — PX: CYSTOSCOPY WITH RETROGRADE PYELOGRAM, URETEROSCOPY AND STENT PLACEMENT: SHX5789

## 2022-10-12 SURGERY — CYSTOURETEROSCOPY, WITH RETROGRADE PYELOGRAM AND STENT INSERTION
Anesthesia: General | Site: Ureter | Laterality: Right

## 2022-10-12 MED ORDER — LACTATED RINGERS IV SOLN: INTRAVENOUS | Status: DC

## 2022-10-12 MED ORDER — MIDAZOLAM HCL 2 MG/2ML IJ SOLN
INTRAMUSCULAR | Status: AC
  Filled 2022-10-12: qty 2

## 2022-10-12 MED ORDER — SODIUM CHLORIDE 0.9 % IR SOLN
Status: DC | PRN
  Administered 2022-10-12: 3000 mL

## 2022-10-12 MED ORDER — HYDROMORPHONE HCL 1 MG/ML IJ SOLN: 0.2500 mg | INTRAMUSCULAR | Status: DC | PRN

## 2022-10-12 MED ORDER — ONDANSETRON HCL 4 MG/2ML IJ SOLN
INTRAMUSCULAR | Status: AC
  Filled 2022-10-12: qty 2

## 2022-10-12 MED ORDER — IOHEXOL 300 MG/ML  SOLN
INTRAMUSCULAR | Status: DC | PRN
  Administered 2022-10-12: 5.5 mL via URETHRAL

## 2022-10-12 MED ORDER — LIDOCAINE HCL (PF) 2 % IJ SOLN
INTRAMUSCULAR | Status: AC
  Filled 2022-10-12: qty 5

## 2022-10-12 MED ORDER — PHENYLEPHRINE HCL (PRESSORS) 10 MG/ML IV SOLN
INTRAVENOUS | Status: AC
  Filled 2022-10-12: qty 1

## 2022-10-12 MED ORDER — DEXMEDETOMIDINE HCL IN NACL 80 MCG/20ML IV SOLN
INTRAVENOUS | Status: DC | PRN
Start: 2022-10-12 — End: 2022-10-12
  Administered 2022-10-12: 4 ug via INTRAVENOUS
  Administered 2022-10-12: 8 ug via INTRAVENOUS

## 2022-10-12 MED ORDER — PROPOFOL 10 MG/ML IV BOLUS
INTRAVENOUS | Status: AC
  Filled 2022-10-12: qty 20

## 2022-10-12 MED ORDER — ONDANSETRON HCL 4 MG/2ML IJ SOLN: 4.0000 mg | Freq: Once | INTRAMUSCULAR | Status: DC | PRN

## 2022-10-12 MED ORDER — CEFAZOLIN SODIUM-DEXTROSE 2-4 GM/100ML-% IV SOLN
2.0000 g | INTRAVENOUS | Status: AC
  Administered 2022-10-12: 2 g via INTRAVENOUS
  Filled 2022-10-12: qty 100

## 2022-10-12 MED ORDER — CHLORHEXIDINE GLUCONATE 0.12 % MT SOLN: 15.0000 mL | Freq: Once | OROMUCOSAL | Status: DC

## 2022-10-12 MED ORDER — FENTANYL CITRATE (PF) 100 MCG/2ML IJ SOLN
INTRAMUSCULAR | Status: AC
  Filled 2022-10-12: qty 2

## 2022-10-12 MED ORDER — OXYCODONE HCL 5 MG PO TABS
ORAL_TABLET | ORAL | Status: AC
  Filled 2022-10-12: qty 1

## 2022-10-12 MED ORDER — PROPOFOL 10 MG/ML IV BOLUS
INTRAVENOUS | Status: DC | PRN
Start: 2022-10-12 — End: 2022-10-12
  Administered 2022-10-12: 150 mg via INTRAVENOUS
  Administered 2022-10-12: 30 mg via INTRAVENOUS

## 2022-10-12 MED ORDER — LIDOCAINE 2% (20 MG/ML) 5 ML SYRINGE
INTRAMUSCULAR | Status: DC | PRN
  Administered 2022-10-12: 100 mg via INTRAVENOUS

## 2022-10-12 MED ORDER — DEXAMETHASONE SODIUM PHOSPHATE 10 MG/ML IJ SOLN
INTRAMUSCULAR | Status: AC
  Filled 2022-10-12: qty 1

## 2022-10-12 MED ORDER — ONDANSETRON HCL 4 MG/2ML IJ SOLN
INTRAMUSCULAR | Status: DC | PRN
  Administered 2022-10-12: 4 mg via INTRAVENOUS

## 2022-10-12 MED ORDER — DROPERIDOL 2.5 MG/ML IJ SOLN: 0.6250 mg | Freq: Once | INTRAMUSCULAR | Status: DC | PRN

## 2022-10-12 MED ORDER — OXYCODONE HCL 5 MG PO TABS
5.0000 mg | ORAL_TABLET | Freq: Once | ORAL | Status: AC | PRN
  Administered 2022-10-12: 5 mg via ORAL

## 2022-10-12 MED ORDER — ORAL CARE MOUTH RINSE: 15.0000 mL | Freq: Once | OROMUCOSAL | Status: DC

## 2022-10-12 MED ORDER — EPHEDRINE 5 MG/ML INJ
INTRAVENOUS | Status: AC
  Filled 2022-10-12: qty 5

## 2022-10-12 MED ORDER — OXYCODONE HCL 5 MG/5ML PO SOLN: 5.0000 mg | Freq: Once | ORAL | Status: AC | PRN

## 2022-10-12 MED ORDER — DEXMEDETOMIDINE HCL IN NACL 80 MCG/20ML IV SOLN
INTRAVENOUS | Status: AC
  Filled 2022-10-12: qty 20

## 2022-10-12 MED ORDER — DEXAMETHASONE SODIUM PHOSPHATE 10 MG/ML IJ SOLN
INTRAMUSCULAR | Status: DC | PRN
  Administered 2022-10-12: 10 mg via INTRAVENOUS

## 2022-10-12 MED ORDER — ACETAMINOPHEN 10 MG/ML IV SOLN: 1000.0000 mg | Freq: Once | INTRAVENOUS | Status: DC | PRN

## 2022-10-12 MED ORDER — PHENYLEPHRINE HCL-NACL 20-0.9 MG/250ML-% IV SOLN
INTRAVENOUS | Status: DC | PRN
Start: 2022-10-12 — End: 2022-10-12
  Administered 2022-10-12: 50 ug/min via INTRAVENOUS

## 2022-10-12 SURGICAL SUPPLY — 26 items
APL SKNCLS STERI-STRIP NONHPOA (GAUZE/BANDAGES/DRESSINGS)
BAG URO CATCHER STRL LF (MISCELLANEOUS) ×1 IMPLANT
BASKET ZERO TIP NITINOL 2.4FR (BASKET) IMPLANT
BENZOIN TINCTURE PRP APPL 2/3 (GAUZE/BANDAGES/DRESSINGS) IMPLANT
BSKT STON RTRVL ZERO TP 2.4FR (BASKET)
CATH URETERAL DUAL LUMEN 10F (MISCELLANEOUS) IMPLANT
CATH URETL OPEN 5X70 (CATHETERS) ×1 IMPLANT
CLOTH BEACON ORANGE TIMEOUT ST (SAFETY) ×1 IMPLANT
DRSG TEGADERM 2-3/8X2-3/4 SM (GAUZE/BANDAGES/DRESSINGS) IMPLANT
FIBER LASER MOSES 200 DFL (Laser) IMPLANT
GLOVE BIOGEL M 7.0 STRL (GLOVE) ×1 IMPLANT
GOWN STRL REUS W/ TWL XL LVL3 (GOWN DISPOSABLE) ×1 IMPLANT
GOWN STRL REUS W/TWL XL LVL3 (GOWN DISPOSABLE) ×1
GUIDEWIRE STR DUAL SENSOR (WIRE) ×2 IMPLANT
GUIDEWIRE ZIPWRE .038 STRAIGHT (WIRE) IMPLANT
KIT TURNOVER KIT A (KITS) IMPLANT
LASER FIB FLEXIVA PULSE ID 365 (Laser) IMPLANT
MANIFOLD NEPTUNE II (INSTRUMENTS) ×1 IMPLANT
PACK CYSTO (CUSTOM PROCEDURE TRAY) ×1 IMPLANT
SHEATH DILATOR SET 8/10 (MISCELLANEOUS) IMPLANT
SHEATH NAVIGATOR HD 12/14X46 (SHEATH) IMPLANT
STENT URET 6FRX26 CONTOUR (STENTS) IMPLANT
TRACTIP FLEXIVA PULS ID 200XHI (Laser) IMPLANT
TRACTIP FLEXIVA PULSE ID 200 (Laser)
TUBING CONNECTING 10 (TUBING) ×1 IMPLANT
TUBING UROLOGY SET (TUBING) ×1 IMPLANT

## 2022-10-12 NOTE — Telephone Encounter (Signed)
This message was received 10 days ago.  The message was sent to me 4 days ago.  I was not in the office.  It appears the surgery is scheduled for today.  In the future, please consider sending queries like this to the provider of the day as often the response is time sensitive regarding timing of surgery.

## 2022-10-12 NOTE — Transfer of Care (Signed)
Immediate Anesthesia Transfer of Care Note  Patient: Philip [name]  Procedure(s) Performed: CYSTOSCOPY WITH RIGHT  RETROGRADE PYELOGRAM, RIGHT STENT EXCHANGE (Right: Ureter)  Patient Location: PACU  Anesthesia Type:General  Level of Consciousness: awake, alert , and oriented  Airway & Oxygen Therapy: Patient Spontanous Breathing and Patient connected to nasal cannula oxygen  Post-op Assessment: Report given to RN and Post -op Vital signs reviewed and stable  Post vital signs: Reviewed and stable  Last Vitals:  Vitals Value Taken Time  BP    Temp    Pulse    Resp    SpO2      Last Pain:  Vitals:   10/12/22 1233  TempSrc:   PainSc: 0-No pain         Complications: No notable events documented.

## 2022-10-12 NOTE — Discharge Instructions (Signed)
[facility] °[phone] °Post Ureteroscopy With or Without Stent Instructions ° °Definitions: ° °Ureter: The duct that transports urine from the kidney to the bladder. °Stent:   A plastic hollow tube that is placed into the ureter, from the kidney to the                 bladder to prevent the ureter from swelling shut. ° °GENERAL INSTRUCTIONS: ° °Despite the fact that no skin incisions were used, the area around the ureter and bladder is raw and irritated. The stent is a foreign body which will further irritate the bladder wall. This irritation is manifested by increased frequency of urination, both day and night, and by an increase in the urge to urinate. In some, the urge to urinate is present almost always. Sometimes the urge is strong enough that you may not be able to stop yourself from urinating. The only real cure is to remove the stent and then give time for the bladder wall to heal which can't be done until the danger of the ureter swelling shut has passed, which varies. ° °You may see some blood in your urine while the stent is in place and a few days afterwards. Do not be alarmed, even if the urine was clear for a while. Get off your feet and drink lots of fluids until clearing occurs. If you start to pass clots or don't improve, call us. ° °DIET: °You may return to your normal diet immediately. Because of the raw surface of your bladder, alcohol, spicy foods, acid type foods and drinks with caffeine may cause irritation or frequency and should be used in moderation. To keep your urine flowing freely and to avoid constipation, drink plenty of fluids during the day ( 8-10 glasses ). °Tip: Avoid cranberry juice because it is very acidic. ° °ACTIVITY: °Your physical activity doesn't need to be restricted. However, if you are very active, you may see some blood in your urine. We suggest that you reduce your activity under these circumstances until the bleeding has stopped. ° °BOWELS: °It is  important to keep your bowels regular during the postoperative period. Straining with bowel movements can cause bleeding. A bowel movement every other day is reasonable. Use a mild laxative if needed, such as Milk of Magnesia 2-3 tablespoons, or 2 Dulcolax tablets. Call if you continue to have problems. If you have been taking narcotics for pain, before, during or after your surgery, you may be constipated. Take a laxative if necessary. ° ° °MEDICATION: °You should resume your pre-surgery medications unless told not to. In addition you will often be given an antibiotic to prevent infection. These should be taken as prescribed until the bottles are finished unless you are having an unusual reaction to one of the drugs. ° °PROBLEMS YOU SHOULD REPORT TO US: °· Fevers over 100.5 Fahrenheit. °· Heavy bleeding, or clots ( See above notes about blood in urine ). °· Inability to urinate. °· Drug reactions ( hives, rash, nausea, vomiting, diarrhea ). °· Severe burning or pain with urination that is not improving. ° °FOLLOW-UP: °You will need a follow-up appointment to monitor your progress. Call for this appointment at the number listed above. Usually the first appointment will be about three to fourteen days after your surgery. ° ° ° ° ° °

## 2022-10-12 NOTE — Op Note (Signed)
Operative Note   Preoperative diagnosis:  1.  Right hydronephrosis 2. Metastatic lung cancer   Postoperative diagnosis: 1.  Right UPJ obstruction 2. Metastatic lung cancer   Procedure(s): 1.  Cystoscopy 2. Right retrograde pyelogram with interpretation 3. Right ureteral stent exchange 4. Fluoroscopy <1 hour with intraoperative interpretation   Surgeon: Jettie Pagan, MD   Assistants:  None   Anesthesia:  General   Complications:  None   EBL:  Minimal   Specimens: None   Drains/Catheters: 1.  Right 6Fr x 26cm ureteral stent   Intraoperative findings:   1. Cystoscopy demonstrated no suspicious lesions, masses, stones or other pathology. 2. Right retrograde pyelogram demonstrated severe right hydronephrosis at the level of the right UPJ consistent with a UPJ obstruction. 3. Successful right ureteral stent exchange with curl in the renal pelvis and bladder respectively.   Indication:  Philip [name] is a 67 y.o. male with a right UPJ obstruction. He does have a history of metastatic lung cancer and continues to undergo chemotherapy. After reviewing the management options for treatment, he elected to proceed with the above surgical procedure(s). We have discussed the potential benefits and risks of the procedure, side effects of the proposed treatment, the likelihood of the patient achieving the goals of the procedure, and any potential problems that might occur during the procedure or recuperation. Informed consent has been obtained.   Description of procedure: The patient was taken to the operating room and general anesthesia was induced.  The patient was placed in the dorsal lithotomy position, prepped and draped in the usual sterile fashion, and preoperative antibiotics were administered. A preoperative time-out was performed.    Cystourethroscopy was performed.  The patient's urethra was examined and was normal. There was some bilobar prostatic hypertrophy. The bladder was  then systematically examined in its entirety. There was no evidence for any bladder tumors, stones, or other mucosal pathology.     Attention then turned to the right ureteral orifice. Stent was grasped with a grasper and brought to the meatus watching the proximal coil unfurl nicely. A 0.038 zip wire was passed through stent and into the renal pelvis. This was exchanged for an open ended catheter. Omnipaque contrast was injected through the ureteral catheter and a retrograde pyelogram was performed with findings as dictated above. The wire was then replaced and the open ended catheter was removed.    A 6Fr x 26cm ureteral stent was advance over the wire. The stent was positioned appropriately under fluoroscopic and cystoscopic guidance.  The wire was then removed with an adequate stent curl noted in the renal pelvis as well as in the bladder.   The bladder was then emptied and the procedure ended.  The patient appeared to tolerate the procedure well and without complications.  The patient was able to be awakened and transferred to the recovery unit in satisfactory condition.    [name] R. Khristin Keleher MD [facility]  Pager: [phone]

## 2022-10-12 NOTE — Anesthesia Procedure Notes (Signed)
Procedure Name: LMA Insertion Date/Time: 10/12/2022 2:31 PM  Performed by: Florene Route, CRNAPatient Re-evaluated:Patient Re-evaluated prior to induction Oxygen Delivery Method: Circle system utilized Preoxygenation: Pre-oxygenation with 100% oxygen Induction Type: IV induction LMA: LMA inserted LMA Size: 4.0 Number of attempts: 1 Placement Confirmation: positive ETCO2 and breath sounds checked- equal and bilateral Tube secured with: Tape Dental Injury: Teeth and Oropharynx as per pre-operative assessment

## 2022-10-12 NOTE — H&P (Signed)
Office Visit Report     09/17/2022   --------------------------------------------------------------------------------   Philip [name]  MRN: [id number]  DOB: 1955-10-26, 67 year old Male  SSN:    PRIMARY CARE:     REFERRING:  Si Gaul, MD  PROVIDER:  Jettie Pagan, M.D.  LOCATION:  [facility] [phone]     --------------------------------------------------------------------------------   CC/HPI: Philip [name] is a -year-old male who is seen in follow-up today for right UPJ obstruction managed with serial right ureteral stent exchanges.   Of note, he has significant past medical history with recurrent non-small cell lung cancer, pulmonary embolism and is on home oxygen. He was scheduled undergo right stent exchange in February/2024 however had increased requirements of oxygen and did not obtain pulmonary clearance. He has since required reduction in oxygen.   1. Right UPJ obstruction:  -He has a history of recurrent non-small cell lung cancer who was initially diagnosed as stage IIIc (T4N3M0) who presented with a large right hilar mass with additional bilateral hilar and mediastinal lymphadenopathy diagnosed in 01/2019. He has had recurrence since 11/2020. He is on systemic chemotherapy and has undergone 10 cycles. He continues with chemotherapy.  -Incidentally found on CT C/A/P 08/16/2021 was moderate right hydronephrosis that was new compared to prior examination with abrupt change at the right ureteropelvic junction.  -Creatinine was 1.0 on 08/18/2021 equating to a GFR of 84.  -He denies history of urolithiasis or upper urinary tract instrumentation.  -Lasix renal scan 09/17/2021 with normal left renogram. He had dilated right renal collecting system with evidence of impaired clearance of contrast. Right kidney differential 45%. T1 half post Lasix and a on the right with failure to clear half of maximum activity by conclusion of exam.  -S/p right ureteral stent  placement 10/24/2021.  -KUB 03/12/2022 with send appropriate position.  -CT imaging 06/2022 with right ureter stent in appropriate position with no hydronephrosis.  -He has admitted flank sided discomfort however nothing significant. He denies fevers, chills, dysuria.   Patient currently denies fever, chills, sweats, nausea, vomiting, abdominal or flank pain, gross hematuria or dysuria.   He has a past medical history of recurrent non-small cell lung cancer, pulmonary embolism, CAD, hyperlipidemia. He is on Xarelto for history of pulmonary embolism.     ALLERGIES: carboplatin Clonazapam Norco TABS    MEDICATIONS: Tamsulosin Hcl 0.4 mg capsule 1 capsule PO Q HS  Zyrtec  Albuterol Sulfate Hfa 90 mcg hfa aerosol with adapter  Alimta  Atorvastatin Calcium 20 mg tablet  Famotidine 20 mg tablet  Folic Acid  Mucinex  Multivitamin  Stiolo Respimat  Xarelto 20 mg tablet     GU PSH: Cystoscopy Insert Stent - 10/24/2021     NON-GU PSH: Bronchoscopy W/biopsy(s) - 2020     GU PMH: Hydronephrosis (Stable) - 03/12/2022, - 11/10/2021, - 09/02/2021    NON-GU PMH: Atherosclerosis of aorta Coronary Artery Disease Hypercholesterolemia Lung Cancer, History Pulmonary Embolism, History    FAMILY HISTORY: 3 daughters - Runs in Family Cancer - Runs in Family Dementia - Runs in Family   SOCIAL HISTORY: Marital Status: Married Ethnicity: Not Hispanic Or Latino; Race: White Current Smoking Status: Patient smokes. Has smoked since 09/02/1976.   Tobacco Use Assessment Completed: Used Tobacco in last 30 days? Does drink.  Drinks 2 caffeinated drinks per day.    REVIEW OF SYSTEMS:    GU Review Male:   Patient reports burning/ pain with urination. Patient denies frequent urination, hard to postpone urination, get up at night  to urinate, leakage of urine, stream starts and stops, trouble starting your stream, have to strain to urinate , erection problems, and penile pain.  Gastrointestinal (Upper):    Patient denies nausea, vomiting, and indigestion/ heartburn.  Gastrointestinal (Lower):   Patient denies diarrhea and constipation.  Constitutional:   Patient denies fever, night sweats, weight loss, and fatigue.  Skin:   Patient denies skin rash/ lesion and itching.  Eyes:   Patient denies blurred vision and double vision.  Ears/ Nose/ Throat:   Patient denies sore throat and sinus problems.  Hematologic/Lymphatic:   Patient denies swollen glands and easy bruising.  Cardiovascular:   Patient denies leg swelling and chest pains.  Respiratory:   Patient denies cough and shortness of breath.  Endocrine:   Patient denies excessive thirst.  Musculoskeletal:   Patient denies back pain and joint pain.  Neurological:   Patient denies headaches and dizziness.  Psychologic:   Patient denies depression and anxiety.   VITAL SIGNS: None   MULTI-SYSTEM PHYSICAL EXAMINATION:    Constitutional: Well-nourished. No physical deformities. Normally developed. Good grooming.  Respiratory: No labored breathing, no use of accessory muscles.   Cardiovascular: Normal temperature, normal extremity pulses, no swelling, no varicosities.  Gastrointestinal: No mass, no tenderness, no rigidity, non obese abdomen.     Complexity of Data:  Source Of History:  Patient, Medical Record Summary  Records Review:   Previous Doctor Records, Previous Patient Records  Urine Test Review:   Urinalysis  X-Ray Review: C.T. Abdomen/Pelvis: Reviewed Films. Reviewed Report. Discussed With Patient.    Notes:                     CLINICAL DATA: 67 year old male with history of non-small cell lung  cancer. Follow-up study.   EXAM:  CT CHEST, ABDOMEN, AND PELVIS WITH CONTRAST   TECHNIQUE:  Multidetector CT imaging of the chest, abdomen and pelvis was  performed following the standard protocol during bolus  administration of intravenous contrast.   RADIATION DOSE REDUCTION: This exam was performed according to the  departmental  dose-optimization program which includes automated  exposure control, adjustment of the mA and/or kV according to  patient size and/or use of iterative reconstruction technique.   CONTRAST: 75mL OMNIPAQUE IOHEXOL 300 MG/ML SOLN   COMPARISON: Multiple prior examinations, most recently chest CT  03/11/2022. Additionally, CT of the chest, abdomen and pelvis  01/07/2022.   FINDINGS:  CT CHEST FINDINGS   Cardiovascular: Heart size is normal. There is no significant  pericardial fluid, thickening or pericardial calcification. There is  aortic atherosclerosis, as well as atherosclerosis of the great  vessels of the mediastinum and the coronary arteries, including  calcified atherosclerotic plaque in the left main, left anterior  descending, left circumflex and right coronary arteries.   Mediastinum/Nodes: No pathologically enlarged mediastinal or hilar  lymph nodes. Esophagus is unremarkable in appearance. No axillary  lymphadenopathy.   Lungs/Pleura: Extensive areas of mass-like architectural distortion  and volume loss are noted in the medial aspects of both lungs,  compatible with areas of chronic postradiation mass-like fibrosis,  similar to prior examinations. No definite new suspicious appearing  pulmonary nodules or masses are noted. No new acute consolidative  airspace disease. Small right pleural effusion slightly increased  compared to the prior study. No left pleural effusion. Diffuse  bronchial wall thickening with moderate centrilobular and paraseptal  emphysema. Extensive septal thickening is noted in the lungs  bilaterally, most severe in the left lower lobe where  there is some  subpleural reticulation, mild ground-glass attenuation and some  peripheral bronchiolectasis, concerning for interstitial lung  disease, similar to the prior study.   Musculoskeletal: There are no aggressive appearing lytic or blastic  lesions noted in the visualized portions of the skeleton.    CT ABDOMEN PELVIS FINDINGS   Hepatobiliary: No suspicious cystic or solid hepatic lesions. No  intra or extrahepatic biliary ductal dilatation. Gallbladder is  unremarkable in appearance.   Pancreas: No pancreatic mass. No pancreatic ductal dilatation. No  peripancreatic fluid collections or inflammatory changes.   Spleen: Unremarkable.   Adrenals/Urinary Tract: 2.6 x 2.2 cm left adrenal nodule, stable  compared to prior studies, previously characterized as a benign  adenoma (no imaging follow-up recommended). Right adrenal gland and  left kidney are normal in appearance. Double-J ureteral stent noted  in the right ureter with the proximal loop reformed in the right  renal pelvis and distal loop reformed in the urinary bladder. Right  kidney is otherwise normal in appearance. No hydroureteronephrosis.  Urinary bladder is unremarkable in appearance.   Stomach/Bowel: The appearance of the stomach is normal. No  pathologic dilatation of small bowel or colon. Normal appendix.   Vascular/Lymphatic: No atherosclerotic calcifications are noted in  the abdominal aorta or pelvic vasculature. No lymphadenopathy noted  in the abdomen or pelvis.   Reproductive: Prostate gland and seminal vesicles are unremarkable  in appearance.   Other: No significant volume of ascites. No pneumoperitoneum.   Musculoskeletal: There are no aggressive appearing lytic or blastic  lesions noted in the visualized portions of the skeleton.   IMPRESSION:  1. Similar appearance of chronic postradiation changes in the lungs  bilaterally, with no definitive findings to suggest locally  recurrent disease or definite metastatic disease in the chest,  abdomen or pelvis.  2. Slight increased size of small right pleural effusion. This is  nonspecific. Attention on follow-up studies is recommended.  3. Chronic changes in the left lower lobe concerning for potential  interstitial lung disease, similar to the prior  study. Outpatient  referral to Pulmonology could be considered for further clinical  evaluation if clinically appropriate.  4. Diffuse bronchial wall thickening with moderate centrilobular and  paraseptal emphysema; imaging findings suggestive of underlying  COPD.  5. Aortic atherosclerosis, in addition to left main and three-vessel  coronary artery disease. Please note that although the presence of  coronary artery calcium documents the presence of coronary artery  disease, the severity of this disease and any potential stenosis  cannot be assessed on this non-gated CT examination. Assessment for  potential risk factor modification, dietary therapy or pharmacologic  therapy may be warranted, if clinically indicated.  6. Additional incidental findings, as above.    Electronically Signed  By: Trudie Reed M.D.  On: 06/21/2022 12:49   PROCEDURES:          Visit Complexity - G2211    ASSESSMENT:      ICD-10 Details  1 GU:   Hydronephrosis - N13.0    PLAN:           Orders Labs Urine Culture          Document Letter(s):  Created for Patient: Clinical Summary         Notes:    1. Right UPJ obstruction:  -Will arrange for next available right stent exchange. Discussed risk benefits. Surgery letter sent. Will plan for serial stent exchanges every 6 months.  -In the past, he did not receive pulmonary clearance  to undergo stent exchanges as he had increased requirement home oxygen supplementation. He is now down to 4 L from 8 L. We will send pulmonary clearance to Dr. Vilma Meckel. We discussed need for stent exchange as this can lead to encrustation and failure.  -Will send urine for culture preoperatively.   CC: Si Gaul, MD        Next Appointment:      Next Appointment: 10/12/2022 01:30 PM    Appointment Type: Surgery     Location: [facility] [phone]    Provider: Jettie Pagan, M.D.    Reason for Visit: [facility]/OP CYSTO, (R) RPG, (R)  STENT EXCHANGE, POSSIBLE (R) URS WITH HLL    Urology Preoperative H&P   Chief Complaint: Right stent exchange  History of Present Illness: Philip [name] is a 67 y.o. male with a right UPJ obstruction here for right stent exchange. Denies fevers, chills, dysuria. Ucx 09/17/2022 NG.    Past Medical History:  Diagnosis Date   COPD (chronic obstructive pulmonary disease) (HCC)    Dyspnea    GERD (gastroesophageal reflux disease)    Headache    migraines   Hyperlipidemia    lung ca dx'd 12/2018   On home oxygen therapy    3 to 10 L   Pneumonia    Pulmonary embolus (HCC)    Tobacco abuse     Past Surgical History:  Procedure Laterality Date   COLONOSCOPY     CYSTOSCOPY W/ URETERAL STENT PLACEMENT Right 10/24/2021   Procedure: CYSTOSCOPY WITH RETROGRADE PYELOGRAM/URETERAL STENT PLACEMENT;  Surgeon: Jannifer Hick, MD;  Location: [facility];  Service: Urology;  Laterality: Right;   HAND SURGERY     VASECTOMY     VIDEO BRONCHOSCOPY WITH ENDOBRONCHIAL ULTRASOUND N/A 12/22/2018   Procedure: VIDEO BRONCHOSCOPY WITH ENDOBRONCHIAL ULTRASOUND;  Surgeon: Josephine Igo, DO;  Location: [facility];  Service: Thoracic;  Laterality: N/A;   VIDEO BRONCHOSCOPY WITH ENDOBRONCHIAL ULTRASOUND N/A 01/09/2019   Procedure: VIDEO BRONCHOSCOPY WITH ENDOBRONCHIAL ULTRASOUND WITH FLUORO;  Surgeon: Josephine Igo, DO;  Location: [facility];  Service: Thoracic;  Laterality: N/A;   VIDEO BRONCHOSCOPY WITH RADIAL ENDOBRONCHIAL ULTRASOUND N/A 12/22/2018   Procedure: RADIAL ENDOBRONCHIAL ULTRASOUND;  Surgeon: Josephine Igo, DO;  Location: [facility];  Service: Thoracic;  Laterality: N/A;    Allergies:  Allergies  Allergen Reactions   Carboplatin Anaphylaxis, Shortness Of Breath, Cough and Hypertension    On dose 8. Became short of breath. Symptom management was called. Received benadryl, solumedrol, Pepcid and fluids. Medication discontinued.    Klonopin [Clonazepam] Other (See Comments)    nervous   Norco  [Hydrocodone-Acetaminophen] Other (See Comments)    Made pt feel jittery     Family History  Problem Relation Age of Onset   Cancer Father    Stomach cancer Father     Social History:  reports that he quit smoking about 10 months ago. His smoking use included cigarettes. He started smoking about 52 years ago. He has a 102.8 pack-year smoking history. He has quit using smokeless tobacco.  His smokeless tobacco use included chew. He reports current alcohol use. He reports that he does not currently use drugs after having used the following drugs: Marijuana.  ROS: A complete review of systems was performed.  All systems are negative except for pertinent findings as noted.  Physical Exam:  Vital signs in last 24 hours:   Constitutional:  Alert and oriented, No acute distress Cardiovascular: Regular rate and  rhythm Respiratory: Normal respiratory effort, Lungs clear bilaterally GI: Abdomen is soft, nontender, nondistended, no abdominal masses GU: No CVA tenderness Lymphatic: No lymphadenopathy Neurologic: Grossly intact, no focal deficits Psychiatric: Normal mood and affect  Laboratory Data:  No results for input(s): "WBC", "HGB", "HCT", "PLT" in the last 72 hours.  No results for input(s): "NA", "K", "CL", "GLUCOSE", "BUN", "CALCIUM", "CREATININE" in the last 72 hours.  Invalid input(s): "CO3"   No results found for this or any previous visit (from the past 24 hour(s)). No results found for this or any previous visit (from the past 240 hour(s)).  Renal Function: No results for input(s): "CREATININE" in the last 168 hours. Estimated Creatinine Clearance: 55.3 mL/min (A) (by C-G formula based on SCr of 1.39 mg/dL (H)).  Radiologic Imaging: No results found.  I independently reviewed the above imaging studies.  Assessment and Plan Philip [name] is a 66 y.o. male with a right UPJ obstruction here for right stent exchange.   -The risks, benefits and alternatives of  cystoscopy with right JJ stent exchange was discussed with the patient.  Risks include, but are not limited to: bleeding, urinary tract infection, ureteral injury, ureteral stricture disease, chronic pain, urinary symptoms, bladder injury, stent migration, the need for nephrostomy tube placement, MI, CVA, DVT, PE and the inherent risks with general anesthesia.  The patient voices understanding and wishes to proceed.   [name] R. Brettney Ficken MD 10/12/2022, 10:55 AM  [facility] Pager: [phone]

## 2022-10-12 NOTE — Anesthesia Postprocedure Evaluation (Signed)
Anesthesia Post Note  Patient: Philip [name]  Procedure(s) Performed: CYSTOSCOPY WITH RIGHT  RETROGRADE PYELOGRAM, RIGHT STENT EXCHANGE (Right: Ureter)     Patient location during evaluation: PACU Anesthesia Type: General Level of consciousness: awake and alert Pain management: pain level controlled Vital Signs Assessment: post-procedure vital signs reviewed and stable Respiratory status: spontaneous breathing, nonlabored ventilation, respiratory function stable and patient connected to nasal cannula oxygen Cardiovascular status: blood pressure returned to baseline and stable Postop Assessment: no apparent nausea or vomiting Anesthetic complications: no   No notable events documented.  Last Vitals:  Vitals:   10/12/22 1509 10/12/22 1515  BP: 110/76 102/84  Pulse: (!) 101 98  Resp: 15 19  Temp: 36.5 C   SpO2: 96% 97%    Last Pain:  Vitals:   10/12/22 1515  TempSrc:   PainSc: 0-No pain                 Trevor Iha

## 2022-10-13 ENCOUNTER — Encounter (HOSPITAL_COMMUNITY): Payer: Self-pay | Admitting: Urology

## 2022-10-26 ENCOUNTER — Encounter: Payer: Self-pay | Admitting: Nurse Practitioner

## 2022-10-26 ENCOUNTER — Inpatient Hospital Stay: Payer: No Typology Code available for payment source | Attending: Internal Medicine | Admitting: Nurse Practitioner

## 2022-10-26 DIAGNOSIS — Z515 Encounter for palliative care: Secondary | ICD-10-CM | POA: Diagnosis not present

## 2022-10-26 DIAGNOSIS — C3490 Malignant neoplasm of unspecified part of unspecified bronchus or lung: Secondary | ICD-10-CM

## 2022-10-26 DIAGNOSIS — M792 Neuralgia and neuritis, unspecified: Secondary | ICD-10-CM

## 2022-10-26 DIAGNOSIS — G893 Neoplasm related pain (acute) (chronic): Secondary | ICD-10-CM | POA: Diagnosis not present

## 2022-10-26 DIAGNOSIS — R530 Neoplastic (malignant) related fatigue: Secondary | ICD-10-CM

## 2022-10-26 MED ORDER — GABAPENTIN 300 MG PO CAPS
300.0000 mg | ORAL_CAPSULE | Freq: Two times a day (BID) | ORAL | 3 refills | Status: DC
Start: 2022-10-26 — End: 2022-12-22

## 2022-10-26 MED ORDER — HYDROCODONE-ACETAMINOPHEN 5-325 MG PO TABS
1.0000 | ORAL_TABLET | Freq: Two times a day (BID) | ORAL | 0 refills | Status: DC | PRN
Start: 2022-11-02 — End: 2022-12-22

## 2022-10-26 NOTE — Progress Notes (Signed)
Palliative Medicine [facility]  [phone] [phone]   Name: Philip [name] Date: 10/26/2022 MRN: [id number]  DOB: [birth year]  Patient Care Team: Farris Has, MD as PCP - General (Family Medicine) Syliva Overman, RN as Oncology Nurse Navigator Thomasene Ripple, RN as Case Manager (General Practice) Pickenpack-Cousar, Arty Baumgartner, NP as Nurse Practitioner (Nurse Practitioner)   I connected with Philip [name] on 10/26/22 at 11:30 AM EDT by phone and verified that I am speaking with the correct person using two identifiers.   I discussed the limitations, risks, security and privacy concerns of performing an evaluation and management service by telemedicine and the availability of in-person appointments. I also discussed with the patient that there may be a patient responsible charge related to this service. The patient expressed understanding and agreed to proceed.   Other persons participating in the visit and their role in the encounter: n/a   Patient's location: home  Provider's location: [facility]   Chief Complaint: f/u of symptom management   INTERVAL HISTORY: Philip [name] is a 67 y.o. male with oncologic medical history including recurrent non-small cell lung cancer (01/2019 then again 12/2020). Past medical history also includes COPD, BPH, tobacco use, PE, and GERD. Palliative ask to see for symptom management and goals of care.   SOCIAL HISTORY:     reports that he quit smoking about 10 months ago. His smoking use included cigarettes. He started smoking about 52 years ago. He has a 102.8 pack-year smoking history. He has quit using smokeless tobacco.  His smokeless tobacco use included chew. He reports current alcohol use. He reports that he does not currently use drugs after having used the following drugs: Marijuana.  ADVANCE DIRECTIVES:  None on file   CODE STATUS: Full code  PAST MEDICAL HISTORY: Past Medical History:   Diagnosis Date   COPD (chronic obstructive pulmonary disease) (HCC)    Dyspnea    GERD (gastroesophageal reflux disease)    Headache    migraines   Hyperlipidemia    lung ca dx'd 12/2018   On home oxygen therapy    3 to 10 L   Pneumonia    Pulmonary embolus (HCC)    Tobacco abuse     ALLERGIES:  is allergic to carboplatin, klonopin [clonazepam], and norco [hydrocodone-acetaminophen].  MEDICATIONS:  Current Outpatient Medications  Medication Sig Dispense Refill   albuterol (PROVENTIL) (2.5 MG/3ML) 0.083% nebulizer solution Take 3 mLs (2.5 mg total) by nebulization every 4 (four) hours as needed for wheezing or shortness of breath. 75 mL 2   albuterol (VENTOLIN HFA) 108 (90 Base) MCG/ACT inhaler Inhale 2 puffs into the lungs every 4 (four) hours as needed for wheezing or shortness of breath. 18 g 11   BREZTRI AEROSPHERE 160-9-4.8 MCG/ACT AERO INHALE 2 PUFFS INTO THE LUNGS IN THE MORNING AND AT BEDTIME. 10.7 each 3   cetirizine (ZYRTEC) 10 MG tablet Take 10 mg by mouth daily.     dextromethorphan-guaiFENesin (MUCINEX DM) 30-600 MG 12hr tablet Take 1 tablet by mouth 2 (two) times daily as needed for cough. 40 tablet 0   furosemide (LASIX) 20 MG tablet Take 1 tablet (20 mg total) by mouth daily as needed.     HYDROcodone-acetaminophen (NORCO/VICODIN) 5-325 MG tablet Take 1 tablet by mouth every 12 (twelve) hours as needed for moderate pain. 60 tablet 0   midodrine (PROAMATINE) 5 MG tablet TAKE 1 TABLET (5 MG TOTAL) BY MOUTH 3 (THREE) TIMES DAILY WITH  MEALS. 270 tablet 1   Multiple Vitamins-Minerals (MULTI ADULT GUMMIES) CHEW Chew 1 tablet by mouth daily.     naphazoline-pheniramine (VISINE) 0.025-0.3 % ophthalmic solution Place 1 drop into both eyes daily as needed for eye irritation.     pantoprazole (PROTONIX) 40 MG tablet TAKE 1 TABLET BY MOUTH EVERY DAY 90 tablet 1   predniSONE (DELTASONE) 10 MG tablet Take 1 tablet (10 mg total) by mouth daily with breakfast. 90 tablet 3    tamsulosin (FLOMAX) 0.4 MG CAPS capsule Take 0.4 mg by mouth at bedtime.     XARELTO 20 MG TABS tablet TAKE 1 TABLET BY MOUTH DAILY WITH SUPPER. 30 tablet 11   No current facility-administered medications for this visit.    VITAL SIGNS: There were no vitals taken for this visit. There were no vitals filed for this visit.  Estimated body mass index is 33.38 kg/m as calculated from the following:   Height as of 10/12/22: 5\' 6"  (1.676 m).   Weight as of 10/12/22: 206 lb 12.8 oz (93.8 kg).   PERFORMANCE STATUS (ECOG) : 1 - Symptomatic but completely ambulatory   IMPRESSION:  I connected by phone with Philip [name]. No acute distress identified. Denies nausea, vomiting, constipation, or diarrhea. Taking things one day at a time. States he is doing well overall. Recent urology procedure went well. Some hematuria however improves with increase in water intake. Appetite is good.   Philip [name] reports pain is well controlled. He has restarted gabapentin. States noticeable difference in pain and discomfort. Also in his breathing. Currently taking hydrocodone as needed. Does not require daily. Taking as directed.   We will continue to support as needed. Patient knows to contact office. Ongoing goals of care discussions.   PLAN:  Hydrocodone 5/325 mg one tablet every 12 hours as needed for moderate to severe pain.  Gabapentin 300mg  twice daily.  Pain contract completed on 2/7 Ongoing goals of care and symptom management support.  I will plan to see patient back in 6-8 weeks in collaboration to other oncology appointments.    Patient expressed understanding and was in agreement with this plan. He also understands that He can call the clinic at any time with any questions, concerns, or complaints.   Any controlled substances utilized were prescribed in the context of palliative care. PDMP has been reviewed.    Visit consisted of counseling and education dealing with the complex and emotionally intense  issues of symptom management and palliative care in the setting of serious and potentially life-threatening illness.Greater than 50%  of this time was spent counseling and coordinating care related to the above assessment and plan.  Willette Alma, AGPCNP-BC  Palliative Medicine Team/[facility]  *Please note that this is a verbal dictation therefore any spelling or grammatical errors are due to the "Dragon Medical One" system interpretation.

## 2022-11-05 ENCOUNTER — Encounter: Payer: Self-pay | Admitting: Internal Medicine

## 2022-11-05 ENCOUNTER — Encounter: Payer: Self-pay | Admitting: Physician Assistant

## 2022-11-16 ENCOUNTER — Telehealth: Payer: Self-pay | Admitting: Pulmonary Disease

## 2022-11-16 ENCOUNTER — Encounter: Payer: Self-pay | Admitting: Physician Assistant

## 2022-11-16 NOTE — Telephone Encounter (Signed)
error 

## 2022-11-16 NOTE — Telephone Encounter (Signed)
Lmam for respiratory to call the patient

## 2022-11-16 NOTE — Telephone Encounter (Signed)
PT has a fu appt. On last visit with NP Pft @ hospital was ordered. Please arrange. Thanks.

## 2022-11-17 NOTE — Telephone Encounter (Signed)
Dr Judeth Horn this patient is under Pallitive care now do they still need this PFT done

## 2022-11-20 NOTE — Telephone Encounter (Signed)
nfn

## 2022-11-23 NOTE — Telephone Encounter (Signed)
No need for PFT thanks

## 2022-12-12 ENCOUNTER — Telehealth: Payer: Self-pay | Admitting: Internal Medicine

## 2022-12-12 NOTE — Telephone Encounter (Signed)
Called patient regarding rescheduled appointment due to provider pal, informed patient about 11/25 appointment. Left a voicemail.

## 2022-12-15 ENCOUNTER — Encounter: Payer: Self-pay | Admitting: Internal Medicine

## 2022-12-15 ENCOUNTER — Encounter: Payer: Self-pay | Admitting: Physician Assistant

## 2022-12-21 ENCOUNTER — Ambulatory Visit: Payer: No Typology Code available for payment source | Admitting: Pulmonary Disease

## 2022-12-21 NOTE — Progress Notes (Unsigned)
Palliative Medicine [facility]  Telephone:([phone]) [phone] Fax:([phone]) [phone]   Name: Philip [name] Date: 12/21/2022 MRN: [id number]  DOB: [birth year]  Patient Care Team: Farris Has, MD as PCP - General (Family Medicine) Syliva Overman, RN as Oncology Nurse Navigator Thomasene Ripple, RN as Case Manager (General Practice) Pickenpack-Cousar, Arty Baumgartner, NP as Nurse Practitioner (Nurse Practitioner)   I connected with Philip [name] on 12/21/22 at 10:30 AM EST by phone and verified that I am speaking with the correct person using two identifiers.   I discussed the limitations, risks, security and privacy concerns of performing an evaluation and management service by telemedicine and the availability of in-person appointments. I also discussed with the patient that there may be a patient responsible charge related to this service. The patient expressed understanding and agreed to proceed.   Other persons participating in the visit and their role in the encounter: n/a   Patient's location: home  Provider's location: [facility]   Chief Complaint: f/u of symptom management   INTERVAL HISTORY: Philip [name] is a 67 y.o. male with oncologic medical history including recurrent non-small cell lung cancer (01/2019 then again 12/2020). Past medical history also includes COPD, BPH, tobacco use, PE, and GERD. Palliative ask to see for symptom management and goals of care.   SOCIAL HISTORY:     reports that he quit smoking about 12 months ago. His smoking use included cigarettes. He started smoking about 52 years ago. He has a 102.8 pack-year smoking history. He has quit using smokeless tobacco.  His smokeless tobacco use included chew. He reports current alcohol use. He reports that he does not currently use drugs after having used the following drugs: Marijuana.  ADVANCE DIRECTIVES:  None on file   CODE STATUS: Full code  PAST MEDICAL HISTORY: Past Medical History:   Diagnosis Date   COPD (chronic obstructive pulmonary disease) (HCC)    Dyspnea    GERD (gastroesophageal reflux disease)    Headache    migraines   Hyperlipidemia    lung ca dx'd 12/2018   On home oxygen therapy    3 to 10 L   Pneumonia    Pulmonary embolus (HCC)    Tobacco abuse     ALLERGIES:  is allergic to carboplatin, klonopin [clonazepam], and norco [hydrocodone-acetaminophen].  MEDICATIONS:  Current Outpatient Medications  Medication Sig Dispense Refill   albuterol (PROVENTIL) (2.5 MG/3ML) 0.083% nebulizer solution Take 3 mLs (2.5 mg total) by nebulization every 4 (four) hours as needed for wheezing or shortness of breath. 75 mL 2   albuterol (VENTOLIN HFA) 108 (90 Base) MCG/ACT inhaler Inhale 2 puffs into the lungs every 4 (four) hours as needed for wheezing or shortness of breath. 18 g 11   BREZTRI AEROSPHERE 160-9-4.8 MCG/ACT AERO INHALE 2 PUFFS INTO THE LUNGS IN THE MORNING AND AT BEDTIME. 10.7 each 3   dextromethorphan-guaiFENesin (MUCINEX DM) 30-600 MG 12hr tablet Take 1 tablet by mouth 2 (two) times daily as needed for cough. 40 tablet 0   furosemide (LASIX) 20 MG tablet Take 1 tablet (20 mg total) by mouth daily as needed.     gabapentin (NEURONTIN) 300 MG capsule Take 1 capsule (300 mg total) by mouth 2 (two) times daily. 60 capsule 3   HYDROcodone-acetaminophen (NORCO/VICODIN) 5-325 MG tablet Take 1 tablet by mouth every 12 (twelve) hours as needed for moderate pain. 60 tablet 0   midodrine (PROAMATINE) 5 MG tablet TAKE 1 TABLET (5 MG TOTAL)  BY MOUTH 3 (THREE) TIMES DAILY WITH MEALS. 270 tablet 1   Multiple Vitamins-Minerals (MULTI ADULT GUMMIES) CHEW Chew 1 tablet by mouth daily.     naphazoline-pheniramine (VISINE) 0.025-0.3 % ophthalmic solution Place 1 drop into both eyes daily as needed for eye irritation.     pantoprazole (PROTONIX) 40 MG tablet TAKE 1 TABLET BY MOUTH EVERY DAY 90 tablet 1   predniSONE (DELTASONE) 10 MG tablet Take 1 tablet (10 mg total) by  mouth daily with breakfast. 90 tablet 3   tamsulosin (FLOMAX) 0.4 MG CAPS capsule Take 0.4 mg by mouth at bedtime.     XARELTO 20 MG TABS tablet TAKE 1 TABLET BY MOUTH DAILY WITH SUPPER. 30 tablet 11   No current facility-administered medications for this visit.    VITAL SIGNS: There were no vitals taken for this visit. There were no vitals filed for this visit.  Estimated body mass index is 33.38 kg/m as calculated from the following:   Height as of 10/12/22: 5\' 6"  (1.676 m).   Weight as of 10/12/22: 206 lb 12.8 oz (93.8 kg).   PERFORMANCE STATUS (ECOG) : 1 - Symptomatic but completely ambulatory  NAD, in wheelchair  RRR Diminished, oxygen in place via facemask AAO x3   Discussed the use of AI scribe software for clinical note transcription with the patient, who gave verbal consent to proceed.   IMPRESSION:  Philip [name] present to clinic for symptom management follow-up. No acute distress. Wife is present. He has a history of chronic pain, reports no change in his pain level and continues to rely on pain medication as needed. Does not require around the clock. He notes that his appetite has decreased, eating only one to two meals a day, but his weight remains stable. Despite this reduced intake, he denies constipation and describes his bowel movements as normal. Denies nausea or vomiting.   The patient confirms that his current pain medication regimen is effective. No changes to regimen at this time. He also reports that he has stopped taking gabapentin as he did not notice any difference in his symptoms. Currently taking hydrocodone 5/325mg  as needed. Last refill 10/28/2022.   Recently, the patient has been experiencing what appears to be acid reflux symptoms after eating. He had previously been taking famotidine which was most helpful compared to pantoprazole. Will refill famotidine.   Philip [name] expresses a desire for in-home physical therapy, as he finds it difficult to leave the  house for appointments. He reports experiencing fatigue and generalized pain, which he attributes to his sedentary lifestyle. He expresses a need for guidance in managing his physical activity to avoid overexertion and subsequent periods of prolonged rest due to his exertion and oxygen demand. He wishes to have evaluation with understanding that his condition is chronic however with support on ways to better manage around his home.   [name] is wearing oxygen face mask. Oxygen needs vary, requiring anywhere from five to nine liters while at rest, and up to fifteen liters during activity. He uses a cannula for regular oxygen administration at home, and a face mask for brief periods of increased oxygen need during activity. He follows closely with his Pulmonology team.   All questions answered and support provided. He knows to contact office as needed.   PLAN:  Chronic Pain Stable on current regimen. Patient reports pain medication is helpful. No changes to regimen at this time.  -Refill pain medication prescription and send to [facility] in [place].  Constipation Patient  reports decreased frequency of bowel movements, possibly due to decreased food intake. No distress or discomfort reported. -Continue current management. No need for stool softener at this time.  Gastroesophageal Reflux Disease (GERD) Patient reports symptoms suggestive of acid reflux. Previously on Pantoprazole and Famotidine. -Refill Famotidine prescription and send to [facility] in [place].  Physical Deconditioning Patient expresses interest in home physical therapy due to difficulty with mobility and fatigue. -Refer to [facility] for home health physical and occupational therapy evaluation and treatment.  Oxygen Requirement Patient reports needing to increase oxygen flow rate during activity and at night. -Continue current oxygen management as instructed by Pulmonology team.  Follow-up Patient has an appointment with  Dr. Gwenyth Bouillon on 12/28/2022 at 2:00 PM. -Plan to check in with patient after the first of the year unless Dr. Gwenyth Bouillon requests an earlier office visit.  Patient expressed understanding and was in agreement with this plan. He also understands that He can call the clinic at any time with any questions, concerns, or complaints.   Any controlled substances utilized were prescribed in the context of palliative care. PDMP has been reviewed.    Visit consisted of counseling and education dealing with the complex and emotionally intense issues of symptom management and palliative care in the setting of serious and potentially life-threatening illness.  Willette Alma, AGPCNP-BC  Palliative Medicine Team/ [facility]  *Please note that this is a verbal dictation therefore any spelling or grammatical errors are due to the "Dragon Medical One" system interpretation.

## 2022-12-22 ENCOUNTER — Other Ambulatory Visit: Payer: Self-pay | Admitting: Internal Medicine

## 2022-12-22 ENCOUNTER — Inpatient Hospital Stay: Payer: No Typology Code available for payment source | Attending: Internal Medicine

## 2022-12-22 ENCOUNTER — Ambulatory Visit (HOSPITAL_COMMUNITY)
Admission: RE | Admit: 2022-12-22 | Discharge: 2022-12-22 | Disposition: A | Discharge status: Home / Self Care | Payer: No Typology Code available for payment source | Source: Ambulatory Visit | Attending: Internal Medicine | Admitting: Internal Medicine

## 2022-12-22 ENCOUNTER — Other Ambulatory Visit: Payer: Self-pay

## 2022-12-22 ENCOUNTER — Inpatient Hospital Stay (HOSPITAL_BASED_OUTPATIENT_CLINIC_OR_DEPARTMENT_OTHER): Payer: No Typology Code available for payment source | Admitting: Nurse Practitioner

## 2022-12-22 ENCOUNTER — Encounter: Payer: Self-pay | Admitting: Nurse Practitioner

## 2022-12-22 VITALS — BP 110/86 | HR 104 | Temp 98.4°F | Resp 21 | Wt 208.6 lb

## 2022-12-22 DIAGNOSIS — Z515 Encounter for palliative care: Secondary | ICD-10-CM

## 2022-12-22 DIAGNOSIS — C3490 Malignant neoplasm of unspecified part of unspecified bronchus or lung: Secondary | ICD-10-CM

## 2022-12-22 DIAGNOSIS — Z923 Personal history of irradiation: Secondary | ICD-10-CM | POA: Insufficient documentation

## 2022-12-22 DIAGNOSIS — C3491 Malignant neoplasm of unspecified part of right bronchus or lung: Secondary | ICD-10-CM

## 2022-12-22 DIAGNOSIS — G893 Neoplasm related pain (acute) (chronic): Secondary | ICD-10-CM

## 2022-12-22 DIAGNOSIS — R1013 Epigastric pain: Secondary | ICD-10-CM | POA: Diagnosis not present

## 2022-12-22 DIAGNOSIS — Z9221 Personal history of antineoplastic chemotherapy: Secondary | ICD-10-CM | POA: Insufficient documentation

## 2022-12-22 DIAGNOSIS — R530 Neoplastic (malignant) related fatigue: Secondary | ICD-10-CM

## 2022-12-22 DIAGNOSIS — C3411 Malignant neoplasm of upper lobe, right bronchus or lung: Secondary | ICD-10-CM | POA: Insufficient documentation

## 2022-12-22 LAB — CBC WITH DIFFERENTIAL (CANCER CENTER ONLY)
Abs Immature Granulocytes: 0.04 10*3/uL (ref 0.00–0.07)
Basophils Absolute: 0.1 10*3/uL (ref 0.0–0.1)
Basophils Relative: 1 %
Eosinophils Absolute: 0.2 10*3/uL (ref 0.0–0.5)
Eosinophils Relative: 2 %
HCT: 41.7 % (ref 39.0–52.0)
Hemoglobin: 13.1 g/dL (ref 13.0–17.0)
Immature Granulocytes: 1 %
Lymphocytes Relative: 7 %
Lymphs Abs: 0.5 10*3/uL — ABNORMAL LOW (ref 0.7–4.0)
MCH: 29.4 pg (ref 26.0–34.0)
MCHC: 31.4 g/dL (ref 30.0–36.0)
MCV: 93.7 fL (ref 80.0–100.0)
Monocytes Absolute: 0.6 10*3/uL (ref 0.1–1.0)
Monocytes Relative: 8 %
Neutro Abs: 6.1 10*3/uL (ref 1.7–7.7)
Neutrophils Relative %: 81 %
Platelet Count: 272 10*3/uL (ref 150–400)
RBC: 4.45 MIL/uL (ref 4.22–5.81)
RDW: 13.3 % (ref 11.5–15.5)
WBC Count: 7.5 10*3/uL (ref 4.0–10.5)
nRBC: 0 % (ref 0.0–0.2)

## 2022-12-22 LAB — CMP (CANCER CENTER ONLY)
ALT: 14 U/L (ref 0–44)
AST: 20 U/L (ref 15–41)
Albumin: 3.6 g/dL (ref 3.5–5.0)
Alkaline Phosphatase: 97 U/L (ref 38–126)
Anion gap: 4 — ABNORMAL LOW (ref 5–15)
BUN: 14 mg/dL (ref 8–23)
CO2: 34 mmol/L — ABNORMAL HIGH (ref 22–32)
Calcium: 9.1 mg/dL (ref 8.9–10.3)
Chloride: 102 mmol/L (ref 98–111)
Creatinine: 0.98 mg/dL (ref 0.61–1.24)
GFR, Estimated: >60 mL/min (ref >60)
Glucose, Bld: 125 mg/dL — ABNORMAL HIGH (ref 70–99)
Potassium: 4.6 mmol/L (ref 3.5–5.1)
Sodium: 140 mmol/L (ref 135–145)
Total Bilirubin: 0.4 mg/dL (ref <1.2)
Total Protein: 6.9 g/dL (ref 6.5–8.1)

## 2022-12-22 MED ORDER — IOHEXOL 300 MG/ML  SOLN
100.0000 mL | Freq: Once | INTRAMUSCULAR | Status: AC | PRN
  Administered 2022-12-22: 100 mL via INTRAVENOUS

## 2022-12-22 MED ORDER — HYDROCODONE-ACETAMINOPHEN 5-325 MG PO TABS
1.0000 | ORAL_TABLET | Freq: Two times a day (BID) | ORAL | 0 refills | Status: DC | PRN
Start: 2022-12-22 — End: 2023-02-10

## 2022-12-22 MED ORDER — FAMOTIDINE 20 MG PO TABS
20.0000 mg | ORAL_TABLET | Freq: Two times a day (BID) | ORAL | 3 refills | Status: DC
Start: 2022-12-22 — End: 2023-03-08

## 2022-12-22 NOTE — Progress Notes (Signed)
Home health orders placed per NP orders.

## 2022-12-24 ENCOUNTER — Encounter: Payer: Self-pay | Admitting: Physician Assistant

## 2022-12-24 NOTE — Telephone Encounter (Signed)
Telephone call  

## 2022-12-28 ENCOUNTER — Inpatient Hospital Stay (HOSPITAL_BASED_OUTPATIENT_CLINIC_OR_DEPARTMENT_OTHER): Payer: No Typology Code available for payment source | Admitting: Internal Medicine

## 2022-12-28 VITALS — BP 127/81 | HR 102 | Temp 97.5°F | Resp 18 | Ht 66.0 in | Wt 201.6 lb

## 2022-12-28 DIAGNOSIS — Z9221 Personal history of antineoplastic chemotherapy: Secondary | ICD-10-CM | POA: Diagnosis not present

## 2022-12-28 DIAGNOSIS — C3490 Malignant neoplasm of unspecified part of unspecified bronchus or lung: Secondary | ICD-10-CM | POA: Diagnosis not present

## 2022-12-28 DIAGNOSIS — Z923 Personal history of irradiation: Secondary | ICD-10-CM | POA: Diagnosis not present

## 2022-12-28 DIAGNOSIS — C3411 Malignant neoplasm of upper lobe, right bronchus or lung: Secondary | ICD-10-CM | POA: Diagnosis present

## 2022-12-28 NOTE — Progress Notes (Signed)
[facility] Telephone:(336) [phone]   Fax:(336) [phone]  OFFICE PROGRESS NOTE  Farris Has, MD [address] [place] [address]  DIAGNOSIS:  1) recurrent non-small cell lung cancer initially diagnosed as stage IIIc (T4, N3, M0) non-small cell lung cancer, adenocarcinoma presented with large right hilar mass in addition to bilateral hilar and mediastinal lymphadenopathy diagnosed in December 2020.  The patient has evidence for disease recurrence in October 2022. 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  Started on Xarelto on March 17, 2020 and currently 20 mg p.o. daily.   PRIOR THERAPY: 1) Weekly concurrent chemoradiation with carboplatin for an AUC of 2 and paclitaxel 45 mg/m2. First dose starting 01/30/2019.   Status post 6 cycles. 2) Consolidation immunotherapy with Imfinzi 1500 mg IV every 4 weeks. First dose expected on 04/19/2019.  Status post 13 cycles. 3) disease recurrence in October 2022. 4) Systemic chemotherapy with carboplatin for AUC of 5, Alimta 500 Mg/M2 and Keytruda 200 Mg IV every 3 weeks.  First dose January 01, 2021.  Status post 21 cycles.  He had hypersensitivity reaction to carboplatin and this was discontinued after cycle #2.  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 Treatment has been on hold since 04/02/22 due to other health conditions.   CURRENT THERAPY: Observation  INTERVAL HISTORY: Philip [name] 67 y.o. male returns to the clinic today for follow-up visit accompanied by his wife Philip [name].Discussed the use of AI scribe software for clinical note transcription with the patient, who gave verbal consent to proceed.  History of Present Illness   Philip [name], a 67 year old patient with a history of cancer, initially presented in December 2020 with stage  III cancer. He underwent chemotherapy with Radiation followed by Immunotherapy with Imfinzi and subsequently developed Recurrence. In October 2022, he began a new chemotherapy regimen consisting of Carboplatin, Alimta, and Keytruda.  Over the past three months, the patient has experienced worsening shortness of breath and has been unable to lose weight, reaching a stable weight of 200 pounds. He had been on a low dose of prednisone, which was discontinued by his pulmonologist, after which he noticed a deterioration in his breathing. He was sleeping with an oxygen level of two to three at night, which increased to nine to ten at night following the discontinuation of prednisone.  The patient also reported an uncomfortable interaction with his pulmonologist, Dr. Judeth Horn, who suggested considering hospice care, which the patient found distressing. He has episodes of breathlessness that he finds alarming, but otherwise feels well.  The patient had a stent placed for kidney issues by Dr. Cardell Peach, a urologist.  After restarting the low dose of prednisone, his oxygen level at night improved to five to six.  The patient also mentioned a small amount of fluid in his lungs (effusion), but it was not deemed necessary to drain at this time. He expressed concern about potential complications from drainage.         MEDICAL HISTORY: Past Medical History:  Diagnosis Date   COPD (chronic obstructive pulmonary disease) (HCC)    Dyspnea    GERD (gastroesophageal reflux disease)    Headache    migraines   Hyperlipidemia    lung ca dx'd 12/2018   On home oxygen therapy    3 to 10 L   Pneumonia  Pulmonary embolus (HCC)    Tobacco abuse     ALLERGIES:  is allergic to carboplatin, klonopin [clonazepam], and norco [hydrocodone-acetaminophen].  MEDICATIONS:  Current Outpatient Medications  Medication Sig Dispense Refill   albuterol (PROVENTIL) (2.5 MG/3ML) 0.083% nebulizer solution Take 3 mLs (2.5 mg  total) by nebulization every 4 (four) hours as needed for wheezing or shortness of breath. 75 mL 2   albuterol (VENTOLIN HFA) 108 (90 Base) MCG/ACT inhaler Inhale 2 puffs into the lungs every 4 (four) hours as needed for wheezing or shortness of breath. 18 g 11   BREZTRI AEROSPHERE 160-9-4.8 MCG/ACT AERO INHALE 2 PUFFS INTO THE LUNGS IN THE MORNING AND AT BEDTIME. 10.7 each 3   dextromethorphan-guaiFENesin (MUCINEX DM) 30-600 MG 12hr tablet Take 1 tablet by mouth 2 (two) times daily as needed for cough. 40 tablet 0   famotidine (PEPCID) 20 MG tablet Take 1 tablet (20 mg total) by mouth 2 (two) times daily. 60 tablet 3   furosemide (LASIX) 20 MG tablet Take 1 tablet (20 mg total) by mouth daily as needed.     HYDROcodone-acetaminophen (NORCO/VICODIN) 5-325 MG tablet Take 1 tablet by mouth every 12 (twelve) hours as needed for moderate pain (pain score 4-6). 60 tablet 0   midodrine (PROAMATINE) 5 MG tablet TAKE 1 TABLET (5 MG TOTAL) BY MOUTH 3 (THREE) TIMES DAILY WITH MEALS. 270 tablet 1   Multiple Vitamins-Minerals (MULTI ADULT GUMMIES) CHEW Chew 1 tablet by mouth daily.     naphazoline-pheniramine (VISINE) 0.025-0.3 % ophthalmic solution Place 1 drop into both eyes daily as needed for eye irritation.     pantoprazole (PROTONIX) 40 MG tablet TAKE 1 TABLET BY MOUTH EVERY DAY 90 tablet 1   predniSONE (DELTASONE) 10 MG tablet Take 1 tablet (10 mg total) by mouth daily with breakfast. 90 tablet 3   tamsulosin (FLOMAX) 0.4 MG CAPS capsule Take 0.4 mg by mouth at bedtime.     XARELTO 20 MG TABS tablet TAKE 1 TABLET BY MOUTH DAILY WITH SUPPER. 30 tablet 11   No current facility-administered medications for this visit.    SURGICAL HISTORY:  Past Surgical History:  Procedure Laterality Date   COLONOSCOPY     CYSTOSCOPY W/ URETERAL STENT PLACEMENT Right 10/24/2021   Procedure: CYSTOSCOPY WITH RETROGRADE PYELOGRAM/URETERAL STENT PLACEMENT;  Surgeon: Jannifer Hick, MD;  Location: [facility];  Service:  Urology;  Laterality: Right;   CYSTOSCOPY WITH RETROGRADE PYELOGRAM, URETEROSCOPY AND STENT PLACEMENT Right 10/12/2022   Procedure: CYSTOSCOPY WITH RIGHT  RETROGRADE PYELOGRAM, RIGHT STENT EXCHANGE;  Surgeon: Jannifer Hick, MD;  Location: [facility];  Service: Urology;  Laterality: Right;   HAND SURGERY     VASECTOMY     VIDEO BRONCHOSCOPY WITH ENDOBRONCHIAL ULTRASOUND N/A 12/22/2018   Procedure: VIDEO BRONCHOSCOPY WITH ENDOBRONCHIAL ULTRASOUND;  Surgeon: Josephine Igo, DO;  Location: [facility];  Service: Thoracic;  Laterality: N/A;   VIDEO BRONCHOSCOPY WITH ENDOBRONCHIAL ULTRASOUND N/A 01/09/2019   Procedure: VIDEO BRONCHOSCOPY WITH ENDOBRONCHIAL ULTRASOUND WITH FLUORO;  Surgeon: Josephine Igo, DO;  Location: [facility];  Service: Thoracic;  Laterality: N/A;   VIDEO BRONCHOSCOPY WITH RADIAL ENDOBRONCHIAL ULTRASOUND N/A 12/22/2018   Procedure: RADIAL ENDOBRONCHIAL ULTRASOUND;  Surgeon: Josephine Igo, DO;  Location: [facility];  Service: Thoracic;  Laterality: N/A;    REVIEW OF SYSTEMS:  Constitutional: positive for fatigue Eyes: negative Ears, nose, mouth, throat, and face: negative Respiratory: positive for dyspnea on exertion Cardiovascular: negative Gastrointestinal: negative Genitourinary:negative Integument/breast: negative Hematologic/lymphatic: negative Musculoskeletal:negative Neurological: negative  Behavioral/Psych: negative Endocrine: negative Allergic/Immunologic: negative   PHYSICAL EXAMINATION: General appearance: alert, cooperative, fatigued, and no distress Head: Normocephalic, without obvious abnormality, atraumatic Neck: no adenopathy, no JVD, supple, symmetrical, trachea midline, and thyroid not enlarged, symmetric, no tenderness/mass/nodules Lymph nodes: Cervical, supraclavicular, and axillary nodes normal. Resp: rales bilaterally and wheezes bilaterally Back: symmetric, no curvature. ROM normal. No CVA tenderness. Cardio: regular rate and rhythm, S1, S2 normal, no murmur,  click, rub or gallop GI: soft, non-tender; bowel sounds normal; no masses,  no organomegaly Extremities: extremities normal, atraumatic, no cyanosis or edema Neurologic: Alert and oriented X 3, normal strength and tone. Normal symmetric reflexes. Normal coordination and gait  ECOG PERFORMANCE STATUS: 1 - Symptomatic but completely ambulatory  Blood pressure 127/81, pulse (!) 102, temperature (!) 97.5 F (36.4 C), temperature source Temporal, resp. rate 18, height 5\' 6"  (1.676 m), weight 201 lb 9.6 oz (91.4 kg), SpO2 97%, peak flow (!) 6 L/min.  LABORATORY DATA: Lab Results  Component Value Date   WBC 7.5 12/22/2022   HGB 13.1 12/22/2022   HCT 41.7 12/22/2022   MCV 93.7 12/22/2022   PLT 272 12/22/2022      Chemistry      Component Value Date/Time   NA 140 12/22/2022 1002   K 4.6 12/22/2022 1002   CL 102 12/22/2022 1002   CO2 34 (H) 12/22/2022 1002   BUN 14 12/22/2022 1002   CREATININE 0.98 12/22/2022 1002      Component Value Date/Time   CALCIUM 9.1 12/22/2022 1002   ALKPHOS 97 12/22/2022 1002   AST 20 12/22/2022 1002   ALT 14 12/22/2022 1002   BILITOT 0.4 12/22/2022 1002       RADIOGRAPHIC STUDIES: CT CHEST ABDOMEN PELVIS W CONTRAST  Result Date: 12/26/2022 CLINICAL DATA:  Metastatic lung cancer restaging * Tracking Code: BO * EXAM: CT CHEST, ABDOMEN, AND PELVIS WITH CONTRAST TECHNIQUE: Multidetector CT imaging of the chest, abdomen and pelvis was performed following the standard protocol during bolus administration of intravenous contrast. RADIATION DOSE REDUCTION: This exam was performed according to the departmental dose-optimization program which includes automated exposure control, adjustment of the mA and/or kV according to patient size and/or use of iterative reconstruction technique. CONTRAST:  OMNIPAQUE IOHEXOL 300 MG/ML  SOLN COMPARISON:  09/25/2022 FINDINGS: CT CHEST FINDINGS Cardiovascular: Aortic atherosclerosis. Normal heart size. Left and right  coronary artery calcifications. No pericardial effusion. Mediastinum/Nodes: No enlarged mediastinal, hilar, or axillary lymph nodes. Thyroid gland, trachea, and esophagus demonstrate no significant findings. Lungs/Pleura: Unchanged post treatment/post radiation appearance of the lungs, with dense, right-greater-than-left perihilar fibrosis and volume loss. Unchanged, severe underlying emphysema as well as underlying pulmonary fibrosis. Small right-greater-than-left bilateral pleural effusions, increased compared to prior examination. Musculoskeletal: No chest wall abnormality. No acute osseous findings. CT ABDOMEN PELVIS FINDINGS Hepatobiliary: No solid liver abnormality is seen. No gallstones, gallbladder wall thickening, or biliary dilatation. Pancreas: Unremarkable. No pancreatic ductal dilatation or surrounding inflammatory changes. Spleen: Normal in size without significant abnormality. Adrenals/Urinary Tract: Unchanged benign, incidental fat containing left adrenal adenoma and benign, fatty adenomatous thickening of the right adrenal gland, requiring no specific further follow-up or characterization (series 2, image 58). Right-sided double-J ureteral stent catheter remains in position with formed pigtails in the superior pole calices of the right kidney and the bladder. Unchanged patulous appearance of the right renal pelvis without overt hydronephrosis. No left-sided hydronephrosis or hydroureter. Decompressed, thickened urinary bladder. Stomach/Bowel: Stomach is within normal limits. Appendix appears normal. No evidence of bowel wall thickening,  distention, or inflammatory changes. Vascular/Lymphatic: Aortic atherosclerosis. No enlarged abdominal or pelvic lymph nodes. Reproductive: No mass or other abnormality. Other: No abdominal wall hernia or abnormality. No ascites. Musculoskeletal: No acute osseous findings. IMPRESSION: 1. Unchanged post treatment/post radiation appearance of the lungs, with dense,  right-greater-than-left perihilar fibrosis and volume loss. 2. No evidence of recurrent or metastatic disease in the chest, abdomen, or pelvis. 3. Small right-greater-than-left bilateral pleural effusions, increased compared to prior examination. 4. Unchanged, severe underlying emphysema as well as underlying pulmonary fibrosis. 5. Right-sided double-J ureteral stent catheter remains in position with formed pigtails in the superior pole calices of the right kidney and the bladder. Unchanged patulous appearance of the right renal pelvis without overt hydronephrosis. 6. Coronary artery disease. Aortic Atherosclerosis (ICD10-I70.0) and Emphysema (ICD10-J43.9). Electronically Signed   By: Jearld Lesch M.D.   On: 12/26/2022 14:18     ASSESSMENT AND PLAN: This is a very pleasant 67 years old white male with metastatic non-small cell lung cancer that was initially diagnosed as stage IIIc non-small cell lung cancer, adenocarcinoma diagnosed in December 2020 The patient completed a course of concurrent chemoradiation with weekly carboplatin and paclitaxel status post 6 cycles and he tolerated his treatment well and has partial response. He underwent consolidation treatment with immunotherapy with Imfinzi 1500 mg IV every 4 weeks status post 13 cycles. He was on observation and feeling fine with no concerning complaints except for mild cough as well as the left shoulder pain. His scan showed increase in the size of the soft tissue nodule in the right middle lobe adjacent to the postradiation changes highly suspicious for recurrent disease.  There was also soft tissue of the right hilum/as ago esophageal recess unchanged at but increased compared to remote prior studies. The patient had a PET scan that showed hypermetabolic nodule in the right lower lobe adjacent to the post radiation treatment site increased in size and consistent with lung cancer recurrence.  There was second hypermetabolic nodule in the right lower  lobe above the diaphragm concerning for recurrence and hypermetabolic contralateral lymph node in the high left paratracheal mediastinum also concerning for metastatic adenopathy recurrence. He started systemic chemotherapy with carboplatin for AUC of 5, Alimta 500 Mg/M2 and Keytruda 200 Mg IV every 3 weeks on January 01, 2021.   Carboplatin was discontinued with cycle #2 secondary to hypersensitivity reaction.  The patient is status post 21 cycles.  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   He was treated with single agent Alimta and this has been on hold since February 2024. The patient is currently on observation and feeling fine with no concerning complaints except for the baseline shortness of breath and fatigue.    Lung Cancer with Metastasis Lung cancer with metastasis, initially diagnosed in December 2020. Underwent chemotherapy with carboplatin, pemetrexed, and pembrolizumab. Currently on maintenance therapy with pemetrexed and pembrolizumab. Recent CT scans show no new growth or spread. Reports worsening dyspnea and weight stability issues. Pulmonologist stopped low-dose prednisone, leading to increased dyspnea and nocturnal oxygen requirement. Risks of long-term steroid use include diabetes and adrenal insufficiency. - Continue maintenance therapy with pemetrexed and pembrolizumab - Schedule follow-up CT scan in three months - Perform lab tests one week before the next visit  Pulmonary Fibrosis and COPD Worsening dyspnea over the last three months. Pulmonary fibrosis and COPD, likely exacerbated by previous radiation therapy. Previously on low-dose prednisone, which was stopped by pulmonologist. Restarting prednisone improved  symptoms. Risks of long-term steroid use include diabetes and adrenal insufficiency. - Restart low-dose prednisone - Monitor oxygen levels and adjust supplemental oxygen as needed -  Follow up with pulmonologist for further management  Pleural Effusion Small pleural effusion noted on imaging. Not currently large enough to require drainage. Patient concerned about potential complications from drainage procedures. - Monitor pleural effusion with regular imaging - Avoid drainage unless effusion increases significantly  Kidney Stent Placement Right-sided double-J ureteral stent placed by urologist, Dr. Kirtland Bouchard, due to previous kidney issues. Stent functioning well. - Continue monitoring kidney function - Follow up with urologist as needed  General Health Maintenance Generally in good health aside from lung condition. No new symptoms or issues requiring immediate attention. - Encourage healthy diet and regular exercise - Ensure regular follow-up appointments and screenings  Follow-up - Schedule next follow-up appointment in three months - Perform lab tests and CT scan one week before the next visit.   The patient was advised to call immediately if he has any other concerning symptoms in the interval.   The patient voices understanding of current disease status and treatment options and is in agreement with the current care plan. All questions were answered. The patient knows to call the clinic with any problems, questions or concerns. We can certainly see the patient much sooner if necessary. The total time spent in the appointment was 30 minutes.  Disclaimer: This note was dictated with voice recognition software. Similar sounding words can inadvertently be transcribed and may not be corrected upon review.

## 2022-12-29 ENCOUNTER — Ambulatory Visit: Payer: No Typology Code available for payment source | Admitting: Internal Medicine

## 2023-02-09 NOTE — Progress Notes (Signed)
 Palliative Medicine [facility]  Telephone:(336) [phone] Fax:(336) [phone]   Name: Philip [name] Date: 02/09/2023 MRN: [id number]  DOB: 1955/08/14  Patient Care Team: Kip Righter, MD as PCP - General (Family Medicine) Evertt Lonell BROCKS, RN as Oncology Nurse Navigator El Shire, RN as Case Manager (General Practice) Pickenpack-Cousar, Fannie SAILOR, NP as Nurse Practitioner (Nurse Practitioner)   I connected with Philip [name] on 02/09/23 at  3:00 PM EST by phone and verified that I am speaking with the correct person using two identifiers.   I discussed the limitations, risks, security and privacy concerns of performing an evaluation and management service by telemedicine and the availability of in-person appointments. I also discussed with the patient that there may be a patient responsible charge related to this service. The patient expressed understanding and agreed to proceed.   Other persons participating in the visit and their role in the encounter: n/a   Patient's location: home  Provider's location: [facility]   Chief Complaint: f/u of symptom management   INTERVAL HISTORY: Philip [name] is a 68 y.o. male with oncologic medical history including recurrent non-small cell lung cancer (01/2019 then again 12/2020). Past medical history also includes COPD, BPH, tobacco use, PE, and GERD. Palliative ask to see for symptom management and goals of care.   SOCIAL HISTORY:     reports that he quit smoking about 14 months ago. His smoking use included cigarettes. He started smoking about 52 years ago. He has a 102.8 pack-year smoking history. He has quit using smokeless tobacco.  His smokeless tobacco use included chew. He reports current alcohol use. He reports that he does not currently use drugs after having used the following drugs: Marijuana.  ADVANCE DIRECTIVES:  None on file   CODE STATUS: Full code  PAST MEDICAL HISTORY: Past Medical History:   Diagnosis Date   COPD (chronic obstructive pulmonary disease) (HCC)    Dyspnea    GERD (gastroesophageal reflux disease)    Headache    migraines   Hyperlipidemia    lung ca dx'd 12/2018   On home oxygen therapy    3 to 10 L   Pneumonia    Pulmonary embolus (HCC)    Tobacco abuse     ALLERGIES:  is allergic to carboplatin , klonopin [clonazepam], and norco [hydrocodone -acetaminophen ].  MEDICATIONS:  Current Outpatient Medications  Medication Sig Dispense Refill   albuterol  (PROVENTIL ) (2.5 MG/3ML) 0.083% nebulizer solution Take 3 mLs (2.5 mg total) by nebulization every 4 (four) hours as needed for wheezing or shortness of breath. 75 mL 2   albuterol  (VENTOLIN  HFA) 108 (90 Base) MCG/ACT inhaler Inhale 2 puffs into the lungs every 4 (four) hours as needed for wheezing or shortness of breath. 18 g 11   BREZTRI  AEROSPHERE 160-9-4.8 MCG/ACT AERO INHALE 2 PUFFS INTO THE LUNGS IN THE MORNING AND AT BEDTIME. 10.7 each 3   dextromethorphan -guaiFENesin  (MUCINEX  DM) 30-600 MG 12hr tablet Take 1 tablet by mouth 2 (two) times daily as needed for cough. 40 tablet 0   famotidine  (PEPCID ) 20 MG tablet Take 1 tablet (20 mg total) by mouth 2 (two) times daily. 60 tablet 3   furosemide  (LASIX ) 20 MG tablet Take 1 tablet (20 mg total) by mouth daily as needed.     HYDROcodone -acetaminophen  (NORCO/VICODIN) 5-325 MG tablet Take 1 tablet by mouth every 12 (twelve) hours as needed for moderate pain (pain score 4-6). 60 tablet 0   midodrine  (PROAMATINE ) 5 MG tablet TAKE 1  TABLET (5 MG TOTAL) BY MOUTH 3 (THREE) TIMES DAILY WITH MEALS. 270 tablet 1   Multiple Vitamins-Minerals (MULTI ADULT GUMMIES) CHEW Chew 1 tablet by mouth daily.     naphazoline-pheniramine (VISINE) 0.025-0.3 % ophthalmic solution Place 1 drop into both eyes daily as needed for eye irritation.     pantoprazole  (PROTONIX ) 40 MG tablet TAKE 1 TABLET BY MOUTH EVERY DAY 90 tablet 1   predniSONE  (DELTASONE ) 10 MG tablet Take 1 tablet (10 mg  total) by mouth daily with breakfast. 90 tablet 3   tamsulosin  (FLOMAX ) 0.4 MG CAPS capsule Take 0.4 mg by mouth at bedtime.     XARELTO  20 MG TABS tablet TAKE 1 TABLET BY MOUTH DAILY WITH SUPPER. 30 tablet 11   No current facility-administered medications for this visit.    VITAL SIGNS: There were no vitals taken for this visit. There were no vitals filed for this visit.  Estimated body mass index is 32.54 kg/m as calculated from the following:   Height as of 12/28/22: 5' 6 (1.676 m).   Weight as of 12/28/22: 201 lb 9.6 oz (91.4 kg).   PERFORMANCE STATUS (ECOG) : 1 - Symptomatic but completely ambulatory   Discussed the use of AI scribe software for clinical note transcription with the patient, who gave verbal consent to proceed.   IMPRESSION:  I connected by phone with Philip [name]  for follow-up. Doing as good as expected. Denies nausea, vomiting, constipation, or diarrhea. Taking things one day at a time. Appetite is good. Continues to be challenge with dyspnea that worsens based on level of activity. He is tolerating home oxygen. Requesting portable tank to use when transporting outside of the home. Unsure if insurance will cover however expressed we will send over order.   Pain is well controlled on current regimen. He is taking Hydrocodone  5/325mg  as needed. Does not require daily use. This is evident as prescriptions was last filled on 11/19. No changes to regimen. Will continue to monitor and support.   All questions answered and support provided. He knows to contact office as needed.   PLAN:  Chronic Pain Stable on current regimen. Patient reports pain medication is helpful. No changes to regimen at this time.  -Hydrocodone  5/325mg  1 tablet every 6 hours as needed  Constipation Patient reports decreased frequency of bowel movements, possibly due to decreased food intake. No distress or discomfort reported. -Continue current management. No need for stool softener at this  time.  Oxygen Requirement Patient reports needing to increase oxygen flow rate during activity and at night. -Continue current oxygen management as instructed by Pulmonology team. -Placed order for portable oxygen high flow tank.   Follow-up -Plan to see patient back 03/22/2023. He knows to contact office sooner if needed.   Patient expressed understanding and was in agreement with this plan. He also understands that He can call the clinic at any time with any questions, concerns, or complaints.   Any controlled substances utilized were prescribed in the context of palliative care. PDMP has been reviewed.    Visit consisted of counseling and education dealing with the complex and emotionally intense issues of symptom management and palliative care in the setting of serious and potentially life-threatening illness.  Levon Borer, AGPCNP-BC  Palliative Medicine Team/[facility]

## 2023-02-10 ENCOUNTER — Encounter: Payer: Self-pay | Admitting: Nurse Practitioner

## 2023-02-10 ENCOUNTER — Inpatient Hospital Stay: Payer: No Typology Code available for payment source | Attending: Internal Medicine | Admitting: Nurse Practitioner

## 2023-02-10 DIAGNOSIS — C3490 Malignant neoplasm of unspecified part of unspecified bronchus or lung: Secondary | ICD-10-CM

## 2023-02-10 DIAGNOSIS — Z515 Encounter for palliative care: Secondary | ICD-10-CM

## 2023-02-10 DIAGNOSIS — G893 Neoplasm related pain (acute) (chronic): Secondary | ICD-10-CM | POA: Diagnosis not present

## 2023-02-10 MED ORDER — HYDROCODONE-ACETAMINOPHEN 5-325 MG PO TABS: 1.0000 | ORAL_TABLET | Freq: Two times a day (BID) | ORAL | 0 refills | Status: DC | PRN

## 2023-02-13 ENCOUNTER — Encounter: Payer: Self-pay | Admitting: Physician Assistant

## 2023-03-08 ENCOUNTER — Ambulatory Visit (INDEPENDENT_AMBULATORY_CARE_PROVIDER_SITE_OTHER): Payer: No Typology Code available for payment source | Admitting: Pulmonary Disease

## 2023-03-08 ENCOUNTER — Encounter: Payer: Self-pay | Admitting: Pulmonary Disease

## 2023-03-08 VITALS — BP 115/74 | Ht 66.0 in | Wt 200.1 lb

## 2023-03-08 DIAGNOSIS — J9611 Chronic respiratory failure with hypoxia: Secondary | ICD-10-CM

## 2023-03-08 NOTE — Progress Notes (Addendum)
 Synopsis: Referred in January 2020 for pulmonary MAC by Farris Has, MD.  Previously seen by Dr. Erick Colace.  Previously seen by Dr. Tonia Brooms.  Subjective:   PATIENT ID: Philip [name] GENDER: male DOB: February 01, 1956, MRN: [id number]  Chief Complaint  Patient presents with   Follow-up    Philip [name] is a 68 y.o.gentleman with a history of stage III lung adenocarcinoma, COPD with chronic hypoxic respiratory failure using 3 to 4 L at rest, more with exertion.  Dyspnea largely unchanged.  Oxygenation has stabilized.  Continues Lasix which helped in the past.  Remains on Breztri and prednisone 10 mg daily.  He endorses intermittent adherence to the [place].  He has upcoming plans for stent exchange given chronic issues in his ureter.       Past Medical History:  Diagnosis Date   COPD (chronic obstructive pulmonary disease) (HCC)    Dyspnea    GERD (gastroesophageal reflux disease)    Headache    migraines   Hyperlipidemia    lung ca dx'd 12/2018   On home oxygen therapy    3 to 10 L   Pneumonia    Pulmonary embolus (HCC)    Tobacco abuse      Family History  Problem Relation Age of Onset   Cancer Father    Stomach cancer Father      Past Surgical History:  Procedure Laterality Date   COLONOSCOPY     CYSTOSCOPY W/ URETERAL STENT PLACEMENT Right 10/24/2021   Procedure: CYSTOSCOPY WITH RETROGRADE PYELOGRAM/URETERAL STENT PLACEMENT;  Surgeon: Jannifer Hick, MD;  Location: [facility];  Service: Urology;  Laterality: Right;   CYSTOSCOPY WITH RETROGRADE PYELOGRAM, URETEROSCOPY AND STENT PLACEMENT Right 10/12/2022   Procedure: CYSTOSCOPY WITH RIGHT  RETROGRADE PYELOGRAM, RIGHT STENT EXCHANGE;  Surgeon: Jannifer Hick, MD;  Location: [facility];  Service: Urology;  Laterality: Right;   HAND SURGERY     VASECTOMY     VIDEO BRONCHOSCOPY WITH ENDOBRONCHIAL ULTRASOUND N/A 12/22/2018   Procedure: VIDEO BRONCHOSCOPY WITH ENDOBRONCHIAL ULTRASOUND;  Surgeon: Josephine Igo, DO;  Location: [facility];   Service: Thoracic;  Laterality: N/A;   VIDEO BRONCHOSCOPY WITH ENDOBRONCHIAL ULTRASOUND N/A 01/09/2019   Procedure: VIDEO BRONCHOSCOPY WITH ENDOBRONCHIAL ULTRASOUND WITH FLUORO;  Surgeon: Josephine Igo, DO;  Location: [facility];  Service: Thoracic;  Laterality: N/A;   VIDEO BRONCHOSCOPY WITH RADIAL ENDOBRONCHIAL ULTRASOUND N/A 12/22/2018   Procedure: RADIAL ENDOBRONCHIAL ULTRASOUND;  Surgeon: Josephine Igo, DO;  Location: [facility];  Service: Thoracic;  Laterality: N/A;    Social History   Socioeconomic History   Marital status: Married    Spouse name: Not on file   Number of children: Not on file   Years of education: Not on file   Highest education level: Not on file  Occupational History   Not on file  Tobacco Use   Smoking status: Former    Current packs/day: 0.00    Average packs/day: 2.0 packs/day for 51.4 years (102.8 ttl pk-yrs)    Types: Cigarettes    Start date: 07/04/1970    Quit date: 12/2021    Years since quitting: 1.2   Smokeless tobacco: Former    Types: Chew   Tobacco comments:    Smoking 2 packs of cigarettes a week. 10/14/2021 [place]    Smoking is on and off, will go a week without smoking then start up and smoke half a pack a day  Vaping Use   Vaping status: Never Used  Substance and Sexual  Activity   Alcohol use: Yes    Comment: hasn't drank in one month   Drug use: Not Currently    Types: Marijuana   Sexual activity: Not on file  Other Topics Concern   Not on file  Social History Narrative   Not on file   Social Drivers of Health   Financial Resource Strain: Not on file  Food Insecurity: No Food Insecurity (01/12/2022)   Hunger Vital Sign    Worried About Running Out of Food in the Last Year: Never true    Ran Out of Food in the Last Year: Never true  Transportation Needs: No Transportation Needs (01/12/2022)   PRAPARE - Administrator, Civil Service (Medical): No    Lack of Transportation (Non-Medical): No  Physical Activity: Not on  file  Stress: Not on file  Social Connections: Not on file  Intimate Partner Violence: Not At Risk (01/12/2022)   Humiliation, Afraid, Rape, and Kick questionnaire    Fear of Current or Ex-Partner: No    Emotionally Abused: No    Physically Abused: No    Sexually Abused: No     Allergies  Allergen Reactions   Carboplatin Anaphylaxis, Shortness Of Breath, Cough and Hypertension    On dose 8. Became short of breath. Symptom management was called. Received benadryl, solumedrol, Pepcid and fluids. Medication discontinued.    Klonopin [Clonazepam] Other (See Comments)    nervous   Norco [Hydrocodone-Acetaminophen] Other (See Comments)    Made pt feel jittery      Immunization History  Administered Date(s) Administered   Fluad Trivalent(High Dose 65+) 09/30/2022   Influenza Split 11/20/2008   Influenza,inj,Quad PF,6+ Mos 10/24/2018, 11/08/2018, 01/15/2020   Influenza,inj,quad, With Preservative 03/15/2015   PFIZER(Purple Top)SARS-COV-2 Vaccination 04/27/2019, 05/11/2019, 12/07/2019, 12/26/2019   PNEUMOCOCCAL CONJUGATE-20 05/12/2021   Pneumococcal Polysaccharide-23 12/12/2007, 10/10/2019   Td 05/18/2014   Tdap 11/16/2006, 05/18/2014    Outpatient Medications Prior to Visit  Medication Sig Dispense Refill   albuterol (VENTOLIN HFA) 108 (90 Base) MCG/ACT inhaler Inhale 2 puffs into the lungs every 4 (four) hours as needed for wheezing or shortness of breath. 18 g 11   BREZTRI AEROSPHERE 160-9-4.8 MCG/ACT AERO INHALE 2 PUFFS INTO THE LUNGS IN THE MORNING AND AT BEDTIME. 10.7 each 3   dextromethorphan-guaiFENesin (MUCINEX DM) 30-600 MG 12hr tablet Take 1 tablet by mouth 2 (two) times daily as needed for cough. 40 tablet 0   furosemide (LASIX) 20 MG tablet Take 1 tablet (20 mg total) by mouth daily as needed.     HYDROcodone-acetaminophen (NORCO/VICODIN) 5-325 MG tablet Take 1 tablet by mouth every 12 (twelve) hours as needed for moderate pain (pain score 4-6). 60 tablet 0   midodrine  (PROAMATINE) 5 MG tablet TAKE 1 TABLET (5 MG TOTAL) BY MOUTH 3 (THREE) TIMES DAILY WITH MEALS. 270 tablet 1   Multiple Vitamins-Minerals (MULTI ADULT GUMMIES) CHEW Chew 1 tablet by mouth daily.     naphazoline-pheniramine (VISINE) 0.025-0.3 % ophthalmic solution Place 1 drop into both eyes daily as needed for eye irritation.     pantoprazole (PROTONIX) 40 MG tablet TAKE 1 TABLET BY MOUTH EVERY DAY 90 tablet 1   predniSONE (DELTASONE) 10 MG tablet Take 1 tablet (10 mg total) by mouth daily with breakfast. 90 tablet 3   tamsulosin (FLOMAX) 0.4 MG CAPS capsule Take 0.4 mg by mouth at bedtime.     XARELTO 20 MG TABS tablet TAKE 1 TABLET BY MOUTH DAILY WITH SUPPER. 30 tablet 11  albuterol (PROVENTIL) (2.5 MG/3ML) 0.083% nebulizer solution Take 3 mLs (2.5 mg total) by nebulization every 4 (four) hours as needed for wheezing or shortness of breath. 75 mL 2   famotidine (PEPCID) 20 MG tablet Take 1 tablet (20 mg total) by mouth 2 (two) times daily. 60 tablet 3   No facility-administered medications prior to visit.   Review of systems: N/AA   Objective:   Vitals:   03/08/23 1420  BP: 115/74  SpO2: 96%  Weight: 200 lb 1.6 oz (90.8 kg)  Height: 5\' 6"  (1.676 m)   96% on   RA BMI Readings from Last 3 Encounters:  03/08/23 32.30 kg/m  12/28/22 32.54 kg/m  12/22/22 33.67 kg/m   Wt Readings from Last 3 Encounters:  03/08/23 200 lb 1.6 oz (90.8 kg)  12/28/22 201 lb 9.6 oz (91.4 kg)  12/22/22 208 lb 9.6 oz (94.6 kg)   Physical exam General: Sitting in wheelchair, chronically ill-appearing Pulmonary: Relatively clear but distant, normal work of breathing on oxygen Neuro: No focal deficits, sensation appears intact Cardiovascular: edema to just above ankle bilaterally, warm Psych: Normal mood, full affect   CBC    Component Value Date/Time   WBC 7.5 12/22/2022 1002   WBC 13.2 (H) 01/20/2022 0645   RBC 4.45 12/22/2022 1002   HGB 13.1 12/22/2022 1002   HCT 41.7 12/22/2022 1002   PLT  272 12/22/2022 1002   MCV 93.7 12/22/2022 1002   MCH 29.4 12/22/2022 1002   MCHC 31.4 12/22/2022 1002   RDW 13.3 12/22/2022 1002   LYMPHSABS 0.5 (L) 12/22/2022 1002   MONOABS 0.6 12/22/2022 1002   EOSABS 0.2 12/22/2022 1002   BASOSABS 0.1 12/22/2022 1002    CHEMISTRY No results for input(s): "NA", "K", "CL", "CO2", "GLUCOSE", "BUN", "CREATININE", "CALCIUM", "MG", "PHOS" in the last 168 hours.  CrCl cannot be calculated (Patient's most recent lab result is older than the maximum 21 days allowed.).   Chest Imaging- films reviewed: CTA PE protocol 01/2021 with fibrotic changes around initial mass and enlarging nodular mass over 2 cm  CT PET 01/03/2019-severe emphysema, right hilar mass with air bronchograms, medial RLL cystic fibrotic changes.  PET avidity of the inferior portion of the mass and medial fibrotic areas of the lower lobe.  CT chest with contrast 04/10/2018-persistent right mass, unchanged right paraseptal emphysema versus cavitary lesions.  Improved adenopathy.  CT chest with contrast 10/06/2019-bilateral centrilobular and paraseptal emphysema.  Increased opacification of right lower lobe cystic area in the medial base compared to March 2021, but improved compared to June 2021 CT.  Persistence of hilar mass on the right.  New irregularly shaped opacity in the left upper lobe.  Increased areas of patchy groundglass and septal thickening in the right upper lobe.  Micro: 01/09/2019 AFB: Component 1 mo ago  Organism ID [id number]    Comment: Mycobacterium avium complex  Amikacin Comment   Comment: 16.0 ug/mL Susceptible  Clarithromycin 2.0 ug/mL Susceptible   Linezolid Comment   Comment: 16.0 ug/mL Intermediate  Moxifloxacin 4.0 ug/mL Resistant   Streptomycin 64.0 ug/mL    01/09/2019 fungus culture-negative 01/09/2019 BAL-many PMN, negative culture 06/17/2019 respiratory culture-normal flora  Pulmonary Functions Testing Results:    Latest Ref Rng & Units 12/21/2018    12:38 PM  PFT Results  FVC-Pre L 3.07   FVC-Predicted Pre % 76   FVC-Post L 3.23   FVC-Predicted Post % 80   Pre FEV1/FVC % % 67   Post FEV1/FCV % % 71   FEV1-Pre  L 2.07   FEV1-Predicted Pre % 68   FEV1-Post L 2.28   DLCO uncorrected ml/min/mmHg 15.47   DLCO UNC% % 64   DLVA Predicted % 79   TLC L 5.35   TLC % Predicted % 86   RV % Predicted % 104    2020- mild obstruction without significant bronchodilator reversibility.  No significant restriction, air trapping, or hyperinflation.  Mildly reduced diffusion.  Flow volume loop supports obstruction.  Pathology 01/09/2019:  RLL adenocarcinoma November 2020 bronch nondiagnostic     Assessment & Plan:     ICD-10-CM   1. Chronic respiratory failure with hypoxia [facility])  J96.11 Home Health    Face-to-face encounter (required for Medicare/Medicaid patients)       Chronic hypoxemic respiratory failure: Intermittent in the past.  Persistent since hospitalization pneumonia 01/2022.  Gradually worsening over time.  He has no reserve with his significant emphysema and radiation fibrosis, lung cancer.  I do not anticipate this will get much better.  Discussed anticipated will eventually continue to worsen.  Fortunate, with some improvement in oxygenation with initiation of Lasix.  See below. -- Goal O2 sats 88% to 90% -- Using 3-4L at rest, more with exertion  Lung adenocarcinoma- stage IIIc (T4N3M0).  Previously received carboplatin, paclitaxel, and radiation from Dec 2020-  mid March 2021, imfimzi 02 May 2019.  Unfortunate recurrence fall 2022 now on maintenance chemotherapy, carboplatin stopped after second cycle given anaphylaxis. -Ongoing management per oncology.   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 Resistant to quinolones; intermediate susceptibility to linezolid.  Low suspicion for active disease. - Traditionally, would avoid ICS but given his  ongoing symptoms of shortness of breath, continue Breztri -PLEASE AVOID MONOTHERAPY WITH MACROLIDES DUE TO RISK OF INDUCIBLE RESISTANCE OF MAI.  Pulmonary embolus:  -Provoked in setting malignancy.  Xarelto to continue indefinitely in the setting of malignancy.    Lower extremity swelling: -- Lasix 20 mg daily as needed, for greater than 2 pound weight gain in 24 hours or 5 pounds or more weight gain over 1 week, or based on lower extremity appearance - using most days  Preoperative evaluation: Pulmonary medicine does not provide preoperative clearance with her preoperative risk assessment.  Based on the ARISCAT model, patient is low or 1.6% risk of postoperative pulmonary complication if duration of surgery is less than 3 hours.  If duration of surgery is greater than 3 hours patient is intermediate or 13.3% risk of postoperative pulmonary complication.  There are no modifiable risk factors to address prior to surgery.   --Recommend holding DOAC 48 hours prior to invasive surgery if able - in this case I see no contraindication to short term discontinuation of DOAC, ultimately the decision on when to stop anticoagulation and the duration to do so is at the discretion of the surgeon, recommend resuming DOAC at timing that is appropriate at the discretion of the surgeon given increase risk of VTE due to active malignancy  RTC in 6 months with Dr. Judeth Horn.     Current Outpatient Medications:    albuterol (VENTOLIN HFA) 108 (90 Base) MCG/ACT inhaler, Inhale 2 puffs into the lungs every 4 (four) hours as needed for wheezing or shortness of breath., Disp: 18 g, Rfl: 11   BREZTRI AEROSPHERE 160-9-4.8 MCG/ACT AERO, INHALE 2 PUFFS INTO THE LUNGS IN THE MORNING AND AT BEDTIME., Disp: 10.7 each, Rfl: 3   dextromethorphan-guaiFENesin (MUCINEX DM) 30-600 MG  12hr tablet, Take 1 tablet by mouth 2 (two) times daily as needed for cough., Disp: 40 tablet, Rfl: 0   furosemide (LASIX) 20 MG tablet, Take 1 tablet  (20 mg total) by mouth daily as needed., Disp: , Rfl:    HYDROcodone-acetaminophen (NORCO/VICODIN) 5-325 MG tablet, Take 1 tablet by mouth every 12 (twelve) hours as needed for moderate pain (pain score 4-6)., Disp: 60 tablet, Rfl: 0   midodrine (PROAMATINE) 5 MG tablet, TAKE 1 TABLET (5 MG TOTAL) BY MOUTH 3 (THREE) TIMES DAILY WITH MEALS., Disp: 270 tablet, Rfl: 1   Multiple Vitamins-Minerals (MULTI ADULT GUMMIES) CHEW, Chew 1 tablet by mouth daily., Disp: , Rfl:    naphazoline-pheniramine (VISINE) 0.025-0.3 % ophthalmic solution, Place 1 drop into both eyes daily as needed for eye irritation., Disp: , Rfl:    pantoprazole (PROTONIX) 40 MG tablet, TAKE 1 TABLET BY MOUTH EVERY DAY, Disp: 90 tablet, Rfl: 1   predniSONE (DELTASONE) 10 MG tablet, Take 1 tablet (10 mg total) by mouth daily with breakfast., Disp: 90 tablet, Rfl: 3   tamsulosin (FLOMAX) 0.4 MG CAPS capsule, Take 0.4 mg by mouth at bedtime., Disp: , Rfl:    XARELTO 20 MG TABS tablet, TAKE 1 TABLET BY MOUTH DAILY WITH SUPPER., Disp: 30 tablet, Rfl: 11   albuterol (PROVENTIL) (2.5 MG/3ML) 0.083% nebulizer solution, Take 3 mLs (2.5 mg total) by nebulization every 4 (four) hours as needed for wheezing or shortness of breath., Disp: 75 mL, Rfl: 2   famotidine (PEPCID) 20 MG tablet, Take 1 tablet (20 mg total) by mouth 2 (two) times daily., Disp: 60 tablet, Rfl: 3  I spent 41 minutes care of patient including face-to-face visit, review of records, coordination of care.  Karren Burly,  MD [facility] 03/08/2023 3:02 PM

## 2023-03-08 NOTE — Patient Instructions (Signed)
Nice to see you again  No changes in medication  I will send a message over to Dr. Cardell Peach in the urology office for stent exchange  The brand name is [employer] for the oxygen concentrator  Return to clinic in 6 months or sooner as needed with Dr. Judeth Horn

## 2023-03-12 ENCOUNTER — Other Ambulatory Visit: Payer: Self-pay | Admitting: Pulmonary Disease

## 2023-03-22 ENCOUNTER — Ambulatory Visit (HOSPITAL_COMMUNITY): Payer: No Typology Code available for payment source

## 2023-03-22 ENCOUNTER — Inpatient Hospital Stay: Payer: No Typology Code available for payment source

## 2023-03-22 ENCOUNTER — Encounter (HOSPITAL_COMMUNITY): Payer: Self-pay

## 2023-03-26 ENCOUNTER — Ambulatory Visit (HOSPITAL_COMMUNITY)
Admission: RE | Admit: 2023-03-26 | Discharge: 2023-03-26 | Disposition: A | Discharge status: Home / Self Care | Payer: No Typology Code available for payment source | Source: Ambulatory Visit | Attending: Internal Medicine | Admitting: Internal Medicine

## 2023-03-26 ENCOUNTER — Other Ambulatory Visit: Payer: Self-pay | Admitting: Internal Medicine

## 2023-03-26 ENCOUNTER — Inpatient Hospital Stay: Payer: No Typology Code available for payment source | Attending: Internal Medicine

## 2023-03-26 DIAGNOSIS — Z7901 Long term (current) use of anticoagulants: Secondary | ICD-10-CM | POA: Insufficient documentation

## 2023-03-26 DIAGNOSIS — J8410 Pulmonary fibrosis, unspecified: Secondary | ICD-10-CM | POA: Insufficient documentation

## 2023-03-26 DIAGNOSIS — C3490 Malignant neoplasm of unspecified part of unspecified bronchus or lung: Secondary | ICD-10-CM | POA: Insufficient documentation

## 2023-03-26 DIAGNOSIS — Z86711 Personal history of pulmonary embolism: Secondary | ICD-10-CM | POA: Insufficient documentation

## 2023-03-26 DIAGNOSIS — I2510 Atherosclerotic heart disease of native coronary artery without angina pectoris: Secondary | ICD-10-CM | POA: Insufficient documentation

## 2023-03-26 DIAGNOSIS — C3411 Malignant neoplasm of upper lobe, right bronchus or lung: Secondary | ICD-10-CM | POA: Insufficient documentation

## 2023-03-26 DIAGNOSIS — J90 Pleural effusion, not elsewhere classified: Secondary | ICD-10-CM | POA: Insufficient documentation

## 2023-03-26 DIAGNOSIS — Z923 Personal history of irradiation: Secondary | ICD-10-CM | POA: Diagnosis not present

## 2023-03-26 DIAGNOSIS — J439 Emphysema, unspecified: Secondary | ICD-10-CM | POA: Diagnosis not present

## 2023-03-26 LAB — CMP (CANCER CENTER ONLY)
ALT: 14 U/L (ref 0–44)
AST: 15 U/L (ref 15–41)
Albumin: 3.8 g/dL (ref 3.5–5.0)
Alkaline Phosphatase: 82 U/L (ref 38–126)
Anion gap: 5 (ref 5–15)
BUN: 15 mg/dL (ref 8–23)
CO2: 35 mmol/L — ABNORMAL HIGH (ref 22–32)
Calcium: 9.6 mg/dL (ref 8.9–10.3)
Chloride: 99 mmol/L (ref 98–111)
Creatinine: 1.13 mg/dL (ref 0.61–1.24)
GFR, Estimated: >60 mL/min (ref >60)
Glucose, Bld: 123 mg/dL — ABNORMAL HIGH (ref 70–99)
Potassium: 4.8 mmol/L (ref 3.5–5.1)
Sodium: 139 mmol/L (ref 135–145)
Total Bilirubin: 0.5 mg/dL (ref 0.0–1.2)
Total Protein: 7 g/dL (ref 6.5–8.1)

## 2023-03-26 LAB — CBC WITH DIFFERENTIAL (CANCER CENTER ONLY)
Abs Immature Granulocytes: 0.05 10*3/uL (ref 0.00–0.07)
Basophils Absolute: 0 10*3/uL (ref 0.0–0.1)
Basophils Relative: 1 %
Eosinophils Absolute: 0.1 10*3/uL (ref 0.0–0.5)
Eosinophils Relative: 1 %
HCT: 43.8 % (ref 39.0–52.0)
Hemoglobin: 13.8 g/dL (ref 13.0–17.0)
Immature Granulocytes: 1 %
Lymphocytes Relative: 6 %
Lymphs Abs: 0.5 10*3/uL — ABNORMAL LOW (ref 0.7–4.0)
MCH: 29.1 pg (ref 26.0–34.0)
MCHC: 31.5 g/dL (ref 30.0–36.0)
MCV: 92.2 fL (ref 80.0–100.0)
Monocytes Absolute: 0.6 10*3/uL (ref 0.1–1.0)
Monocytes Relative: 7 %
Neutro Abs: 7.5 10*3/uL (ref 1.7–7.7)
Neutrophils Relative %: 84 %
Platelet Count: 236 10*3/uL (ref 150–400)
RBC: 4.75 MIL/uL (ref 4.22–5.81)
RDW: 14.6 % (ref 11.5–15.5)
WBC Count: 8.8 10*3/uL (ref 4.0–10.5)
nRBC: 0 % (ref 0.0–0.2)

## 2023-03-26 MED ORDER — IOHEXOL 300 MG/ML  SOLN
80.0000 mL | Freq: Once | INTRAMUSCULAR | Status: AC | PRN
  Administered 2023-03-26: 80 mL via INTRAVENOUS

## 2023-03-30 ENCOUNTER — Telehealth: Payer: Self-pay | Admitting: Pulmonary Disease

## 2023-03-30 ENCOUNTER — Other Ambulatory Visit: Payer: Self-pay | Admitting: Urology

## 2023-03-30 NOTE — Telephone Encounter (Signed)
 Patient checking on referral for physical therapy. Patient phone number is [phone].

## 2023-03-30 NOTE — Telephone Encounter (Signed)
 Needs surgical clearance of this PT. Cysoscopy. Her Fax is [phone]  Bjorn Loser @ [phone]  She will fax something to your attention today. Surgery is March 17th

## 2023-03-30 NOTE — Telephone Encounter (Signed)
 Fax received from Dr. Jettie Pagan with [facility] to perform a cysto/stent exchange on patient.  Patient needs surgery clearance. Surgery is 04/19/23. Patient was seen on 03/08/23. Office protocol is a risk assessment can be sent to surgeon if patient has been seen in 60 days or less.   The note from 03/08/23 does contain risk assessment, however, they are asking for permission to hold Eliquis x 3 days prior to surgery. Is this okay? If so, will you please addend note and then I can fax everything together? Thanks!

## 2023-03-31 ENCOUNTER — Telehealth: Payer: Self-pay

## 2023-03-31 DIAGNOSIS — Z515 Encounter for palliative care: Secondary | ICD-10-CM

## 2023-03-31 DIAGNOSIS — M792 Neuralgia and neuritis, unspecified: Secondary | ICD-10-CM

## 2023-03-31 DIAGNOSIS — C3490 Malignant neoplasm of unspecified part of unspecified bronchus or lung: Secondary | ICD-10-CM

## 2023-03-31 DIAGNOSIS — G893 Neoplasm related pain (acute) (chronic): Secondary | ICD-10-CM

## 2023-03-31 DIAGNOSIS — R530 Neoplastic (malignant) related fatigue: Secondary | ICD-10-CM

## 2023-03-31 NOTE — Telephone Encounter (Signed)
 Patient was notified of message

## 2023-03-31 NOTE — Telephone Encounter (Signed)
 Pt called reporting that he was not able to engage with home health for home PT/OT services d/t order expiring. New order placed and faxed to [facility] at [phone].

## 2023-03-31 NOTE — Telephone Encounter (Signed)
 [facility] does not have the referral for the patient. It may have expired. The patient would like for Dr.Hunsucker to put in a referral for his Physical Therapy. Patient can be reached at  [phone]

## 2023-03-31 NOTE — Telephone Encounter (Signed)
 Spoke with patient and advised oncology placed PT referral to [facility] and it is still open/active in system, he will need to contact their office for assistance with this referral as we cannot manage a referral placed by another office. Patient voiced understanding and will contact oncology office. Nothing further needed at this time.

## 2023-03-31 NOTE — Telephone Encounter (Signed)
 Referral for PT came from Pickenpack-Cousar, Arty Baumgartner, NP (Oncology)  to [facility].   Patient should contact provider's office or [facility] for assistance.  [facility] to advise  PLEASE RELAY ABOVE MESSAGE WHEN PATIENT RETURNS CALL

## 2023-04-01 ENCOUNTER — Other Ambulatory Visit: Payer: Self-pay

## 2023-04-01 DIAGNOSIS — Z515 Encounter for palliative care: Secondary | ICD-10-CM

## 2023-04-01 DIAGNOSIS — R530 Neoplastic (malignant) related fatigue: Secondary | ICD-10-CM

## 2023-04-01 DIAGNOSIS — G893 Neoplasm related pain (acute) (chronic): Secondary | ICD-10-CM

## 2023-04-01 DIAGNOSIS — C3490 Malignant neoplasm of unspecified part of unspecified bronchus or lung: Secondary | ICD-10-CM

## 2023-04-01 NOTE — Progress Notes (Unsigned)
 Pt not accepted at previous referral for home health, will fax referral to second option.

## 2023-04-02 NOTE — Telephone Encounter (Signed)
 Hunsucker, Lesia Sago, MD to [facility]  [facility]   04/01/23  8:57 AM Addended  Note from 03/08/23 was faxed to [facility].

## 2023-04-05 ENCOUNTER — Telehealth: Payer: Self-pay

## 2023-04-05 ENCOUNTER — Inpatient Hospital Stay: Payer: No Typology Code available for payment source | Attending: Internal Medicine | Admitting: Internal Medicine

## 2023-04-05 ENCOUNTER — Other Ambulatory Visit: Payer: Self-pay | Admitting: Nurse Practitioner

## 2023-04-05 DIAGNOSIS — Z515 Encounter for palliative care: Secondary | ICD-10-CM

## 2023-04-05 DIAGNOSIS — C3490 Malignant neoplasm of unspecified part of unspecified bronchus or lung: Secondary | ICD-10-CM | POA: Diagnosis not present

## 2023-04-05 DIAGNOSIS — G893 Neoplasm related pain (acute) (chronic): Secondary | ICD-10-CM

## 2023-04-05 MED ORDER — HYDROCODONE-ACETAMINOPHEN 5-325 MG PO TABS: 1.0000 | ORAL_TABLET | Freq: Two times a day (BID) | ORAL | 0 refills | Status: DC | PRN

## 2023-04-05 NOTE — Progress Notes (Signed)
 [facility] Telephone:([phone]   Fax:(336) [phone]  PROGRESS NOTE FOR TELEMEDICINE VISITS  Philip Has, MD [address] [place] [address]  I connected withNAME@ on 04/05/23 at 10:15 AM EST by telephone visit and verified that I am speaking with the correct person using two identifiers.   I discussed the limitations, risks, security and privacy concerns of performing an evaluation and management service by telemedicine and the availability of in-person appointments. I also discussed with the patient that there may be a patient responsible charge related to this service. The patient expressed understanding and agreed to proceed.  Other persons participating in the visit and their role in the encounter:  None  Patient's location:  Home Provider's location: [facility]  DIAGNOSIS:  1) recurrent non-small cell lung cancer initially diagnosed as stage IIIc (T4, N3, M0) non-small cell lung cancer, adenocarcinoma presented with large right hilar mass in addition to bilateral hilar and mediastinal lymphadenopathy diagnosed in December 2020.  The patient [name] for disease recurrence in October 2022. 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  Started on Xarelto on March 17, 2020 and currently 20 mg p.o. daily.   PRIOR THERAPY: 1) Weekly concurrent chemoradiation with carboplatin for an AUC of 2 and paclitaxel 45 mg/m2. First dose starting 01/30/2019.   Status post 6 cycles. 2) Consolidation immunotherapy with Imfinzi 1500 mg IV every 4 weeks. First dose expected on 04/19/2019.  Status post 13 cycles. 3) disease recurrence in October 2022. 4) Systemic chemotherapy with carboplatin for AUC of 5, Alimta 500 Mg/M2 and Keytruda 200 Mg IV every 3 weeks.  First dose January 01, 2021.  Status post 21 cycles.  He had hypersensitivity reaction  to carboplatin and this was discontinued after cycle #2.  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 Treatment [name] been on hold since 04/02/22 due to other health conditions.   CURRENT THERAPY: Observation  INTERVAL HISTORY: Philip [name] 68 y.o. male [name] a telephone virtual visit with me today for evaluation and discussion of his scan results.Discussed the use of AI scribe software for clinical note transcription with the patient, who gave verbal consent to proceed.  History of Present Illness   Philip [name] "Philip [name]" is a 68 year old male with recurrent non-small cell lung cancer and COPD who presents for a follow-up regarding his recent scan.  He [name] recurrent non-small cell lung cancer, initially diagnosed as stage III in December 2020, with recurrence noted in October 2022. He [name] undergone several treatments, including chemotherapy and radiation. Recent imaging shows no evidence of progression.  He experiences breathing difficulties, which have remained about the same but slightly improved. He is currently using seven liters of oxygen. No chest pain, coughing, or hemoptysis. He also reports no recent weight loss.  He continues to take prednisone 10 mg daily to manage his COPD. The condition is stable but continues to affect his quality of life.  He [name] pulmonary scarring from previous radiation therapy, which contributes to his respiratory symptoms. This is a chronic condition that is being managed alongside his COPD.       MEDICAL HISTORY: Past Medical History:  Diagnosis Date   COPD (chronic obstructive pulmonary disease) (HCC)    Dyspnea    GERD (gastroesophageal reflux disease)    Headache    migraines  Hyperlipidemia    lung ca dx'd 12/2018   On home oxygen therapy    3 to 10 L   Pneumonia    Pulmonary embolus (HCC)    Tobacco abuse     ALLERGIES:  is allergic to carboplatin, klonopin [clonazepam], and  norco [hydrocodone-acetaminophen].  MEDICATIONS:  Current Outpatient Medications  Medication Sig Dispense Refill   albuterol (VENTOLIN HFA) 108 (90 Base) MCG/ACT inhaler Inhale 2 puffs into the lungs every 4 (four) hours as needed for wheezing or shortness of breath. 18 g 11   BREZTRI AEROSPHERE 160-9-4.8 MCG/ACT AERO INHALE 2 PUFFS INTO THE LUNGS IN THE MORNING AND AT BEDTIME. 10.7 each 3   dextromethorphan-guaiFENesin (MUCINEX DM) 30-600 MG 12hr tablet Take 1 tablet by mouth 2 (two) times daily as needed for cough. 40 tablet 0   furosemide (LASIX) 20 MG tablet Take 1 tablet (20 mg total) by mouth daily as needed. 90 tablet 0   HYDROcodone-acetaminophen (NORCO/VICODIN) 5-325 MG tablet Take 1 tablet by mouth every 12 (twelve) hours as needed for moderate pain (pain score 4-6). 60 tablet 0   midodrine (PROAMATINE) 5 MG tablet TAKE 1 TABLET (5 MG TOTAL) BY MOUTH 3 (THREE) TIMES DAILY WITH MEALS. 270 tablet 1   Multiple Vitamins-Minerals (MULTI ADULT GUMMIES) CHEW Chew 1 tablet by mouth daily.     naphazoline-pheniramine (VISINE) 0.025-0.3 % ophthalmic solution Place 1 drop into both eyes daily as needed for eye irritation.     pantoprazole (PROTONIX) 40 MG tablet TAKE 1 TABLET BY MOUTH EVERY DAY 90 tablet 1   predniSONE (DELTASONE) 10 MG tablet Take 1 tablet (10 mg total) by mouth daily with breakfast. 90 tablet 3   tamsulosin (FLOMAX) 0.4 MG CAPS capsule Take 0.4 mg by mouth at bedtime.     XARELTO 20 MG TABS tablet TAKE 1 TABLET BY MOUTH DAILY WITH SUPPER. 30 tablet 11   No current facility-administered medications for this visit.    SURGICAL HISTORY:  Past Surgical History:  Procedure Laterality Date   COLONOSCOPY     CYSTOSCOPY W/ URETERAL STENT PLACEMENT Right 10/24/2021   Procedure: CYSTOSCOPY WITH RETROGRADE PYELOGRAM/URETERAL STENT PLACEMENT;  Surgeon: Jannifer Hick, MD;  Location: [facility];  Service: Urology;  Laterality: Right;   CYSTOSCOPY WITH RETROGRADE PYELOGRAM, URETEROSCOPY  AND STENT PLACEMENT Right 10/12/2022   Procedure: CYSTOSCOPY WITH RIGHT  RETROGRADE PYELOGRAM, RIGHT STENT EXCHANGE;  Surgeon: Jannifer Hick, MD;  Location: [facility];  Service: Urology;  Laterality: Right;   HAND SURGERY     VASECTOMY     VIDEO BRONCHOSCOPY WITH ENDOBRONCHIAL ULTRASOUND N/A 12/22/2018   Procedure: VIDEO BRONCHOSCOPY WITH ENDOBRONCHIAL ULTRASOUND;  Surgeon: Josephine Igo, DO;  Location: [facility];  Service: Thoracic;  Laterality: N/A;   VIDEO BRONCHOSCOPY WITH ENDOBRONCHIAL ULTRASOUND N/A 01/09/2019   Procedure: VIDEO BRONCHOSCOPY WITH ENDOBRONCHIAL ULTRASOUND WITH FLUORO;  Surgeon: Josephine Igo, DO;  Location: [facility];  Service: Thoracic;  Laterality: N/A;   VIDEO BRONCHOSCOPY WITH RADIAL ENDOBRONCHIAL ULTRASOUND N/A 12/22/2018   Procedure: RADIAL ENDOBRONCHIAL ULTRASOUND;  Surgeon: Josephine Igo, DO;  Location: [facility];  Service: Thoracic;  Laterality: N/A;    REVIEW OF SYSTEMS:  Constitutional: positive for fatigue Eyes: negative Ears, nose, mouth, throat, and face: negative Respiratory: positive for cough and dyspnea on exertion Cardiovascular: negative Gastrointestinal: negative Genitourinary:negative Integument/breast: negative Hematologic/lymphatic: negative Musculoskeletal:negative Neurological: negative Behavioral/Psych: negative Endocrine: negative Allergic/Immunologic: negative    LABORATORY DATA: Lab Results  Component Value Date   WBC 8.8 03/26/2023  HGB 13.8 03/26/2023   HCT 43.8 03/26/2023   MCV 92.2 03/26/2023   PLT 236 03/26/2023      Chemistry      Component Value Date/Time   NA 139 03/26/2023 1503   K 4.8 03/26/2023 1503   CL 99 03/26/2023 1503   CO2 35 (H) 03/26/2023 1503   BUN 15 03/26/2023 1503   CREATININE 1.13 03/26/2023 1503      Component Value Date/Time   CALCIUM 9.6 03/26/2023 1503   ALKPHOS 82 03/26/2023 1503   AST 15 03/26/2023 1503   ALT 14 03/26/2023 1503   BILITOT 0.5 03/26/2023 1503       RADIOGRAPHIC  STUDIES: CT CHEST ABDOMEN PELVIS W CONTRAST Result Date: 04/02/2023 CLINICAL DATA:  Metastatic lung cancer restaging * Tracking Code: BO * EXAM: CT CHEST, ABDOMEN, AND PELVIS WITH CONTRAST TECHNIQUE: Multidetector CT imaging of the chest, abdomen and pelvis was performed following the standard protocol during bolus administration of intravenous contrast. RADIATION DOSE REDUCTION: This exam was performed according to the departmental dose-optimization program which includes automated exposure control, adjustment of the mA and/or kV according to patient size and/or use of iterative reconstruction technique. CONTRAST:  80mL OMNIPAQUE IOHEXOL 300 MG/ML  SOLN COMPARISON:  12/22/2022 FINDINGS: CT CHEST FINDINGS Cardiovascular: Aortic atherosclerosis. Normal heart size. Left and right coronary artery calcifications. No pericardial effusion. Mediastinum/Nodes: No enlarged mediastinal, hilar, or axillary lymph nodes. Saber sheath trachea. Thyroid gland and esophagus demonstrate no significant findings. Lungs/Pleura: Unchanged post treatment/post radiation appearance of the chest with dense, right-greater-than-left bilateral perihilar fibrosis and volume loss. Unchanged, severe underlying emphysema as well as bibasilar pulmonary fibrosis. Unchanged small, right-greater-than-left bilateral pleural effusions. Musculoskeletal: No chest wall abnormality. No acute osseous findings. CT ABDOMEN PELVIS FINDINGS Hepatobiliary: No solid liver abnormality is seen. No gallstones, gallbladder wall thickening, or biliary dilatation. Pancreas: Unremarkable. No pancreatic ductal dilatation or surrounding inflammatory changes. Spleen: Normal in size without significant abnormality. Adrenals/Urinary Tract: Unchanged benign left adrenal adenoma and fatty adenomatous thickening of the right adrenal gland, requiring no specific further follow-up or characterization. Unchanged right-sided double-J ureteral stent catheter with formed pigtails in  the superior pole calices and urinary bladder. Unchanged, somewhat patulous appearance of the right renal pelvis without residual hydronephrosis. Left kidney is normal, without renal calculi, solid lesion, or hydronephrosis. Thickening of the decompressed bladder wall. Stomach/Bowel: Stomach is within normal limits. Appendix appears normal. No evidence of bowel wall thickening, distention, or inflammatory changes. Vascular/Lymphatic: Aortic atherosclerosis. No enlarged abdominal or pelvic lymph nodes. Reproductive: No mass or other abnormality. Other: No abdominal wall hernia or abnormality. No ascites. Musculoskeletal: No acute osseous findings. IMPRESSION: 1. Unchanged post treatment/post radiation appearance of the chest with dense, right-greater-than-left bilateral perihilar fibrosis and volume loss. 2. Unchanged small, right-greater-than-left bilateral pleural effusions. 3. No evidence of lymphadenopathy or metastatic disease in the chest, abdomen, or pelvis. 4. Unchanged right-sided double-J ureteral stent catheter with formed pigtails in the superior pole calices and urinary bladder. 5. Emphysema and underlying pulmonary fibrosis. 6. Coronary artery disease. Aortic Atherosclerosis (ICD10-I70.0) and Emphysema (ICD10-J43.9). Electronically Signed   By: Jearld Lesch M.D.   On: 04/02/2023 17:25    ASSESSMENT AND PLAN: This is a very pleasant 68 years old white male with metastatic non-small cell lung cancer that was initially diagnosed as stage IIIc non-small cell lung cancer, adenocarcinoma diagnosed in December 2020 The patient completed a course of concurrent chemoradiation with weekly carboplatin and paclitaxel status post 6 cycles and he tolerated his treatment well and  [name] partial response. He underwent consolidation treatment with immunotherapy with Imfinzi 1500 mg IV every 4 weeks status post 13 cycles. He was on observation and feeling fine with no concerning complaints except for mild cough as  well as the left shoulder pain. His scan showed increase in the size of the soft tissue nodule in the right middle lobe adjacent to the postradiation changes highly suspicious for recurrent disease.  There was also soft tissue of the right hilum/as ago esophageal recess unchanged at but increased compared to remote prior studies. The patient had a PET scan that showed hypermetabolic nodule in the right lower lobe adjacent to the post radiation treatment site increased in size and consistent with lung cancer recurrence.  There was second hypermetabolic nodule in the right lower lobe above the diaphragm concerning for recurrence and hypermetabolic contralateral lymph node in the high left paratracheal mediastinum also concerning for metastatic adenopathy recurrence. He started systemic chemotherapy with carboplatin for AUC of 5, Alimta 500 Mg/M2 and Keytruda 200 Mg IV every 3 weeks on January 01, 2021.   Carboplatin was discontinued with cycle #2 secondary to hypersensitivity reaction.  The patient is status post 21 cycles.  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   He was treated with single agent Alimta and this [name] been on hold since February 2024. He had repeat CT scan of the chest, abdomen and pelvis performed recently.  I personally and independently reviewed the scan and discussed the results with the patient today.  His scan showed no concerning findings for disease progression.    Recurrent Non-Small Cell Lung Cancer (NSCLC) Recurrent NSCLC initially presented as stage III in December 2020 with recurrence in October 2022. Recent scan on March 26, 2023, shows no evidence of progression. Cancer remains under control. Main issue is managing COPD and radiation-induced pulmonary fibrosis. - Order full scan (chest, abdomen, pelvis) in three months  Radiation-Induced Pulmonary Fibrosis Pulmonary fibrosis secondary  to previous radiation therapy for NSCLC, contributing to current respiratory symptoms.  Chronic Obstructive Pulmonary Disease (COPD) COPD with ongoing dyspnea. Currently on 7 liters of oxygen. No recent weight loss or chest pain. Continues on 10 mg prednisone daily to manage symptoms. - Continue 10 mg prednisone daily - Maintain current oxygen therapy at 7 liters  Follow-up - Schedule follow-up visit in three months for scan results and further evaluation.   He was advised to call immediately if he [name] any concerning symptoms in the interval. I discussed the assessment and treatment plan with the patient. The patient was provided an opportunity to ask questions and all were answered. The patient agreed with the plan and demonstrated an understanding of the instructions.   The patient was advised to call back or seek an in-person evaluation if the symptoms worsen or if the condition fails to improve as anticipated.  I provided 30 minutes of non face-to-face telephone visit time during this encounter, and > 50% was spent counseling as documented under my assessment & plan.  Lajuana Matte, MD 04/05/2023 10:43 AM  Disclaimer: This note was dictated with voice recognition software. Similar sounding words can inadvertently be transcribed and may not be corrected upon review.

## 2023-04-05 NOTE — Progress Notes (Unsigned)
 Palliative Medicine [facility]  Telephone:(336) [phone] Fax:(336) [phone]   Name: Philip [name] Date: 04/05/2023 MRN: [id number]  DOB: [birth year]  Patient Care Team: Farris Has, MD as PCP - General (Family Medicine) Syliva Overman, RN as Oncology Nurse Navigator Thomasene Ripple, RN as Case Manager (General Practice) Pickenpack-Cousar, Arty Baumgartner, NP as Nurse Practitioner (Nurse Practitioner)   I connected with Philip [name] on 04/05/23 at  3:00 PM EST by phone and verified that I am speaking with the correct person using two identifiers.   I discussed the limitations, risks, security and privacy concerns of performing an evaluation and management service by telemedicine and the availability of in-person appointments. I also discussed with the patient that there may be a patient responsible charge related to this service. The patient expressed understanding and agreed to proceed.   Other persons participating in the visit and their role in the encounter: n/a   Patient's location: home  Provider's location: [facility]   Chief Complaint: f/u of symptom management   INTERVAL HISTORY: Philip [name] is a 68 y.o. male with oncologic medical history including recurrent non-small cell lung cancer (01/2019 then again 12/2020). Past medical history also includes COPD, BPH, tobacco use, PE, and GERD. Palliative ask to see for symptom management and goals of care.   SOCIAL HISTORY:     reports that he quit smoking about 16 months ago. His smoking use included cigarettes. He started smoking about 52 years ago. He has a 102.8 pack-year smoking history. He has quit using smokeless tobacco.  His smokeless tobacco use included chew. He reports current alcohol use. He reports that he does not currently use drugs after having used the following drugs: Marijuana.  ADVANCE DIRECTIVES:  None on file   CODE STATUS: Full code  PAST MEDICAL HISTORY: Past Medical History:   Diagnosis Date   COPD (chronic obstructive pulmonary disease) (HCC)    Dyspnea    GERD (gastroesophageal reflux disease)    Headache    migraines   Hyperlipidemia    lung ca dx'd 12/2018   On home oxygen therapy    3 to 10 L   Pneumonia    Pulmonary embolus (HCC)    Tobacco abuse     ALLERGIES:  is allergic to carboplatin, klonopin [clonazepam], and norco [hydrocodone-acetaminophen].  MEDICATIONS:  Current Outpatient Medications  Medication Sig Dispense Refill   albuterol (VENTOLIN HFA) 108 (90 Base) MCG/ACT inhaler Inhale 2 puffs into the lungs every 4 (four) hours as needed for wheezing or shortness of breath. 18 g 11   BREZTRI AEROSPHERE 160-9-4.8 MCG/ACT AERO INHALE 2 PUFFS INTO THE LUNGS IN THE MORNING AND AT BEDTIME. 10.7 each 3   dextromethorphan-guaiFENesin (MUCINEX DM) 30-600 MG 12hr tablet Take 1 tablet by mouth 2 (two) times daily as needed for cough. 40 tablet 0   furosemide (LASIX) 20 MG tablet Take 1 tablet (20 mg total) by mouth daily as needed. 90 tablet 0   HYDROcodone-acetaminophen (NORCO/VICODIN) 5-325 MG tablet Take 1 tablet by mouth every 12 (twelve) hours as needed for moderate pain (pain score 4-6). 60 tablet 0   midodrine (PROAMATINE) 5 MG tablet TAKE 1 TABLET (5 MG TOTAL) BY MOUTH 3 (THREE) TIMES DAILY WITH MEALS. 270 tablet 1   Multiple Vitamins-Minerals (MULTI ADULT GUMMIES) CHEW Chew 1 tablet by mouth daily.     naphazoline-pheniramine (VISINE) 0.025-0.3 % ophthalmic solution Place 1 drop into both eyes daily as needed for eye irritation.  pantoprazole (PROTONIX) 40 MG tablet TAKE 1 TABLET BY MOUTH EVERY DAY 90 tablet 1   predniSONE (DELTASONE) 10 MG tablet Take 1 tablet (10 mg total) by mouth daily with breakfast. 90 tablet 3   tamsulosin (FLOMAX) 0.4 MG CAPS capsule Take 0.4 mg by mouth at bedtime.     XARELTO 20 MG TABS tablet TAKE 1 TABLET BY MOUTH DAILY WITH SUPPER. 30 tablet 11   No current facility-administered medications for this visit.     VITAL SIGNS: There were no vitals taken for this visit. There were no vitals filed for this visit.  Estimated body mass index is 32.3 kg/m as calculated from the following:   Height as of 03/08/23: 5\' 6"  (1.676 m).   Weight as of 03/08/23: 200 lb 1.6 oz (90.8 kg).   PERFORMANCE STATUS (ECOG) : 1 - Symptomatic but completely ambulatory   Discussed the use of AI scribe software for clinical note transcription with the patient, who gave verbal consent to proceed.   IMPRESSION:  I connected by phone with Philip [name]  for follow-up. Doing as good as expected. Denies nausea, vomiting, constipation, or diarrhea. Taking things one day at a time. Appetite is good. Continues to be challenge with dyspnea that worsens based on level of activity. He is tolerating home oxygen. Requesting portable tank to use when transporting outside of the home. Unsure if insurance will cover however expressed we will send over order.   Pain is well controlled on current regimen. He is taking Hydrocodone 5/325mg  as needed. Does not require daily use. This is evident as prescriptions was last filled on 11/19. No changes to regimen. Will continue to monitor and support.   All questions answered and support provided. He knows to contact office as needed.   PLAN:  Chronic Pain Stable on current regimen. Patient reports pain medication is helpful. No changes to regimen at this time.  -Hydrocodone 5/325mg  1 tablet every 6 hours as needed  Constipation Patient reports decreased frequency of bowel movements, possibly due to decreased food intake. No distress or discomfort reported. -Continue current management. No need for stool softener at this time.  Oxygen Requirement Patient reports needing to increase oxygen flow rate during activity and at night. -Continue current oxygen management as instructed by Pulmonology team. -Placed order for portable oxygen high flow tank.   Follow-up -Plan to see patient back  03/22/2023. He knows to contact office sooner if needed.   Patient expressed understanding and was in agreement with this plan. He also understands that He can call the clinic at any time with any questions, concerns, or complaints.   Any controlled substances utilized were prescribed in the context of palliative care. PDMP has been reviewed.    Visit consisted of counseling and education dealing with the complex and emotionally intense issues of symptom management and palliative care in the setting of serious and potentially life-threatening illness.  Willette Alma, AGPCNP-BC  Palliative Medicine Team/[facility]

## 2023-04-05 NOTE — Telephone Encounter (Signed)
 Patient reports having trouble with transportation this morning and will not be able to make appt with Dr. Arbutus Ped.  Offered patient telephone visit and okay per Dr. Arbutus Ped. Appt changed. Patient aware.

## 2023-04-06 ENCOUNTER — Telehealth: Payer: Self-pay | Admitting: Internal Medicine

## 2023-04-06 ENCOUNTER — Encounter: Payer: Self-pay | Admitting: Nurse Practitioner

## 2023-04-06 ENCOUNTER — Inpatient Hospital Stay: Payer: No Typology Code available for payment source | Admitting: Nurse Practitioner

## 2023-04-06 DIAGNOSIS — Z7189 Other specified counseling: Secondary | ICD-10-CM

## 2023-04-06 DIAGNOSIS — C3490 Malignant neoplasm of unspecified part of unspecified bronchus or lung: Secondary | ICD-10-CM

## 2023-04-06 DIAGNOSIS — G893 Neoplasm related pain (acute) (chronic): Secondary | ICD-10-CM

## 2023-04-06 DIAGNOSIS — Z515 Encounter for palliative care: Secondary | ICD-10-CM | POA: Diagnosis not present

## 2023-04-06 DIAGNOSIS — R530 Neoplastic (malignant) related fatigue: Secondary | ICD-10-CM

## 2023-04-06 NOTE — Telephone Encounter (Signed)
 Scheduled appointments around a scan expected date. Left the patient a voicemail with the appointment details and the number for radiology.

## 2023-04-07 ENCOUNTER — Encounter: Payer: Self-pay | Admitting: Physician Assistant

## 2023-04-15 NOTE — Patient Instructions (Addendum)
 SURGICAL WAITING ROOM VISITATION  Patients having surgery or a procedure may have no more than 2 support people in the waiting area - these visitors may rotate.    Children under the age of 66 must have an adult with them who is not the patient.  Due to an increase in RSV and influenza rates and associated hospitalizations, children ages 33 and under may not visit patients in [facility] hospitals.  Visitors with respiratory illnesses are discouraged from visiting and should remain at home.  If the patient needs to stay at the hospital during part of their recovery, the visitor guidelines for inpatient rooms apply. Pre-op nurse will coordinate an appropriate time for 1 support person to accompany patient in pre-op.  This support person may not rotate.    Please refer to the [facility] website for the visitor guidelines for Inpatients (after your surgery is over and you are in a regular room).       Your procedure is scheduled on: 04/19/23   Report to [facility] Main Entrance    Report to admitting at 11:15 AM   Call this number if you have problems the morning of surgery [phone]   Do not eat food :or drink liquids After Midnight. But may have sips of water with meds.         Oral Hygiene is also important to reduce your risk of infection.                                    Remember - BRUSH YOUR TEETH THE MORNING OF SURGERY WITH YOUR REGULAR TOOTHPASTE  DENTURES WILL BE REMOVED PRIOR TO SURGERY PLEASE DO NOT APPLY "Poly grip" OR ADHESIVES!!!   Do NOT smoke after Midnight   Stop all vitamins and herbal supplements 7 days before surgery.   Take these medicines the morning of surgery with A SIP OF WATER: Prednisone, Tamsulosin, Norco?Vicodin if needed.  DO NOT TAKE ANY ORAL DIABETIC MEDICATIONS DAY OF YOUR SURGERY  Bring CPAP mask and tubing day of surgery.                              You may not have any metal on your body including hair pins, jewelry, and  body piercing             Do not wear make-up, lotions, powders, perfumes/cologne, or deodorant              Men may shave face and neck.   Do not bring valuables to the hospital. [facility] IS NOT             RESPONSIBLE   FOR VALUABLES.   Contacts, glasses, dentures or bridgework may not be worn into surgery.  DO NOT BRING YOUR HOME MEDICATIONS TO THE HOSPITAL. PHARMACY WILL DISPENSE MEDICATIONS LISTED ON YOUR MEDICATION LIST TO YOU DURING YOUR ADMISSION IN THE HOSPITAL!    Patients discharged on the day of surgery will not be allowed to drive home.  Someone NEEDS to stay with you for the first 24 hours after anesthesia.   Special Instructions: Bring a copy of your healthcare power of attorney and living will documents the day of surgery if you haven't scanned them before.              Please read over the following fact sheets you  were given: IF YOU HAVE QUESTIONS ABOUT YOUR PRE-OP INSTRUCTIONS PLEASE CALL [phone] Rosey Bath   If you received a COVID test during your pre-op visit  it is requested that you wear a mask when out in public, stay away from anyone that may not be feeling well and notify your surgeon if you develop symptoms. If you test positive for Covid or have been in contact with anyone that has tested positive in the last 10 days please notify you surgeon.    [facility] - Preparing for Surgery Before surgery, you can play an important role.  Because skin is not sterile, your skin needs to be as free of germs as possible.  You can reduce the number of germs on your skin by washing with CHG (chlorahexidine gluconate) soap before surgery.  CHG is an antiseptic cleaner which kills germs and bonds with the skin to continue killing germs even after washing. Please DO NOT use if you have an allergy to CHG or antibacterial soaps.  If your skin becomes reddened/irritated stop using the CHG and inform your nurse when you arrive at Short Stay. Do not shave (including legs and  underarms) for at least 48 hours prior to the first CHG shower.  You may shave your face/neck.  Please follow these instructions carefully:  1.  Shower with CHG Soap the night before surgery and the  morning of surgery.  2.  If you choose to wash your hair, wash your hair first as usual with your normal  shampoo.  3.  After you shampoo, rinse your hair and body thoroughly to remove the shampoo.                             4.  Use CHG as you would any other liquid soap.  You can apply chg directly to the skin and wash.  Gently with a scrungie or clean washcloth.  5.  Apply the CHG Soap to your body ONLY FROM THE NECK DOWN.   Do   not use on face/ open                           Wound or open sores. Avoid contact with eyes, ears mouth and   genitals (private parts).                       Wash face,  Genitals (private parts) with your normal soap.             6.  Wash thoroughly, paying special attention to the area where your    surgery  will be performed.  7.  Thoroughly rinse your body with warm water from the neck down.  8.  DO NOT shower/wash with your normal soap after using and rinsing off the CHG Soap.                9.  Pat yourself dry with a clean towel.            10.  Wear clean pajamas.            11.  Place clean sheets on your bed the night of your first shower and do not  sleep with pets. Day of Surgery : Do not apply any lotions/deodorants the morning of surgery.  Please wear clean clothes to the hospital/surgery center.  FAILURE TO FOLLOW THESE INSTRUCTIONS MAY RESULT  IN THE CANCELLATION OF YOUR SURGERY  PATIENT SIGNATURE_________________________________  NURSE SIGNATURE__________________________________  ________________________________________________________________________

## 2023-04-15 NOTE — Progress Notes (Signed)
 COVID Vaccine received:  []  No [x]  Yes Date of any COVID positive Test in last 90 days: no PCP - Farris Has MD Cardiologist - n/a Pulmonologist- [place]- Dr. Judeth Horn  Chest x-ray - 03/26/23 Epic EKG -   Stress Test -  ECHO - 06/17/19 Epic Cardiac Cath -   Bowel Prep - [x]  No  []   Yes ______  Pacemaker / ICD device [x]  No []  Yes   Spinal Cord Stimulator:[x]  No []  Yes       History of Sleep Apnea? [x]  No []  Yes   CPAP used?- [x]  No []  Yes    Does the patient monitor blood sugar?          [x]  No []  Yes  []  N/A  Patient has: [x]  NO Hx DM   []  Pre-DM                 []  DM1  []   DM2 Does patient have a [employer] or Dexacom? []  No []  Yes   Fasting Blood Sugar Ranges-  Checks Blood Sugar _____ times a day  GLP1 agonist / usual dose -no GLP1 instructions:  SGLT-2 inhibitors / usual dose - no SGLT-2 instructions:   Blood Thinner / Instructions:Xarelto - Instructed to hold x2 days per wife.Last dose 04/15/23 PM Aspirin Instructions:  Comments:   Activity level: Patient is able to climb a flight of stairs without difficulty; [x]  No CP  []  No SOB, but would have ___   Patient can  perform ADLs without assistance.   Anesthesia review: COPD, Hx. PE, CAD, on continuous O2  Patient denies shortness of breath, fever, cough and chest pain at PAT appointment.  Patient verbalized understanding and agreement to the Pre-Surgical Instructions that were given to them at this PAT appointment. Patient was also educated of the need to review these PAT instructions again prior to his/her surgery.I reviewed the appropriate phone numbers to call if they have any and questions or concerns.

## 2023-04-16 ENCOUNTER — Encounter (HOSPITAL_COMMUNITY): Payer: Self-pay | Admitting: Physician Assistant

## 2023-04-16 ENCOUNTER — Other Ambulatory Visit: Payer: Self-pay

## 2023-04-16 ENCOUNTER — Encounter (HOSPITAL_COMMUNITY)
Admission: RE | Admit: 2023-04-16 | Discharge: 2023-04-16 | Disposition: A | Discharge status: Home / Self Care | Payer: No Typology Code available for payment source | Source: Ambulatory Visit | Attending: Urology | Admitting: Urology

## 2023-04-16 ENCOUNTER — Encounter (HOSPITAL_COMMUNITY): Payer: Self-pay

## 2023-04-16 VITALS — BP 123/82 | HR 105 | Temp 97.8°F | Resp 20 | Ht 67.0 in | Wt 200.0 lb

## 2023-04-16 DIAGNOSIS — J449 Chronic obstructive pulmonary disease, unspecified: Secondary | ICD-10-CM | POA: Diagnosis not present

## 2023-04-16 DIAGNOSIS — Z87891 Personal history of nicotine dependence: Secondary | ICD-10-CM | POA: Diagnosis not present

## 2023-04-16 DIAGNOSIS — N1330 Unspecified hydronephrosis: Secondary | ICD-10-CM | POA: Insufficient documentation

## 2023-04-16 DIAGNOSIS — Z9981 Dependence on supplemental oxygen: Secondary | ICD-10-CM | POA: Diagnosis not present

## 2023-04-16 DIAGNOSIS — Z01818 Encounter for other preprocedural examination: Secondary | ICD-10-CM | POA: Diagnosis present

## 2023-04-16 DIAGNOSIS — N189 Chronic kidney disease, unspecified: Secondary | ICD-10-CM | POA: Insufficient documentation

## 2023-04-16 HISTORY — DX: Chronic kidney disease, unspecified: N18.9

## 2023-04-16 LAB — CBC
HCT: 46.3 % (ref 39.0–52.0)
Hemoglobin: 13.7 g/dL (ref 13.0–17.0)
MCH: 28.2 pg (ref 26.0–34.0)
MCHC: 29.6 g/dL — ABNORMAL LOW (ref 30.0–36.0)
MCV: 95.5 fL (ref 80.0–100.0)
Platelets: 266 10*3/uL (ref 150–400)
RBC: 4.85 MIL/uL (ref 4.22–5.81)
RDW: 14.4 % (ref 11.5–15.5)
WBC: 9.4 10*3/uL (ref 4.0–10.5)
nRBC: 0 % (ref 0.0–0.2)

## 2023-04-16 NOTE — Progress Notes (Signed)
 Anesthesia Chart Review   Case: [id number] Date/Time: 04/19/23 1315   Procedure: CYSTOSCOPY WITH  RIGHT STENT REPLACEMENT AND RETROGRADE PYELOGRAM (Right) - 30 MINUTE CASE   Anesthesia type: Choice   Pre-op diagnosis: RIGHT HYDRONEPHROSIS   Location: [facility] PROC[facility]URE [facility] / [facility]   Surgeons: Jannifer Hick, MD       DISCUSSION:68 y.o. former smoker with h/o COPD (daily prednisone), on home O2, recurrent non-small cell lung cancer, PE, CKD, right hydronephrosis scheduled for above procedure 04/19/23 with Dr. Jettie Pagan.   Pt last seen by pulmonology 03/08/23. Per OV note dyspnea unchanged. Per OV note, "Preoperative evaluation: Pulmonary medicine does not provide preoperative clearance with her preoperative risk assessment.  Based on the ARISCAT model, patient is low or 1.6% risk of postoperative pulmonary complication if duration of surgery is less than 3 hours.  If duration of surgery is greater than 3 hours patient is intermediate or 13.3% risk of postoperative pulmonary complication.  There are no modifiable risk factors to address prior to surgery.   --Recommend holding DOAC 48 hours prior to invasive surgery if able - in this case I see no contraindication to short term discontinuation of DOAC, ultimately the decision on when to stop anticoagulation and the duration to do so is at the discretion of the surgeon, recommend resuming DOAC at timing that is appropriate at the discretion of the surgeon given increase risk of VTE due to active malignancy"  Pt will hold Xarelto 2 days prior to surgery.   VS: BP 123/82   Pulse (!) 105   Temp 36.6 C (Oral)   Resp 20   Ht 5\' 7"  (1.702 m)   Wt 90.7 kg   SpO2 94%   BMI 31.32 kg/m   PROVIDERS: Farris Has, MD is PCP    LABS: Labs reviewed: Acceptable for surgery. (all labs ordered are listed, but only abnormal results are displayed)  Labs Reviewed  CBC - Abnormal; Notable for the following components:      Result Value   MCHC 29.6 (*)     All other components within normal limits     IMAGES:   EKG:   CV: Echo 06/17/2019 1. Left ventricular ejection fraction, by estimation, is 60 to 65%. The  left ventricle has normal function. The left ventricle has no regional  wall motion abnormalities. Left ventricular diastolic parameters were  normal.   2. Right ventricular systolic function is normal. The right ventricular  size is normal.   3. The mitral valve is normal in structure. Trivial mitral valve  regurgitation. No evidence of mitral stenosis.   4. The aortic valve is tricuspid. Aortic valve regurgitation is not  visualized. Mild aortic valve sclerosis is present, with no evidence of  aortic valve stenosis.   5. The inferior vena cava is normal in size with greater than 50%  respiratory variability, suggesting right atrial pressure of 3 mmHg.  Past Medical History:  Diagnosis Date   Chronic kidney disease    COPD (chronic obstructive pulmonary disease) (HCC)    Dyspnea    GERD (gastroesophageal reflux disease)    Headache    migraines   Hyperlipidemia    lung ca dx'd 12/2018   On home oxygen therapy    3 to 10 L   Pneumonia    Pulmonary embolus (HCC)    Tobacco abuse     Past Surgical History:  Procedure Laterality Date   COLONOSCOPY     CYSTOSCOPY W/ URETERAL STENT PLACEMENT Right  10/24/2021   Procedure: CYSTOSCOPY WITH RETROGRADE PYELOGRAM/URETERAL STENT PLACEMENT;  Surgeon: Jannifer Hick, MD;  Location: [facility];  Service: Urology;  Laterality: Right;   CYSTOSCOPY WITH RETROGRADE PYELOGRAM, URETEROSCOPY AND STENT PLACEMENT Right 10/12/2022   Procedure: CYSTOSCOPY WITH RIGHT  RETROGRADE PYELOGRAM, RIGHT STENT EXCHANGE;  Surgeon: Jannifer Hick, MD;  Location: [facility];  Service: Urology;  Laterality: Right;   HAND SURGERY     VASECTOMY     VIDEO BRONCHOSCOPY WITH ENDOBRONCHIAL ULTRASOUND N/A 12/22/2018   Procedure: VIDEO BRONCHOSCOPY WITH ENDOBRONCHIAL ULTRASOUND;  Surgeon: Josephine Igo, DO;   Location: [facility];  Service: Thoracic;  Laterality: N/A;   VIDEO BRONCHOSCOPY WITH ENDOBRONCHIAL ULTRASOUND N/A 01/09/2019   Procedure: VIDEO BRONCHOSCOPY WITH ENDOBRONCHIAL ULTRASOUND WITH FLUORO;  Surgeon: Josephine Igo, DO;  Location: [facility];  Service: Thoracic;  Laterality: N/A;   VIDEO BRONCHOSCOPY WITH RADIAL ENDOBRONCHIAL ULTRASOUND N/A 12/22/2018   Procedure: RADIAL ENDOBRONCHIAL ULTRASOUND;  Surgeon: Josephine Igo, DO;  Location: [facility];  Service: Thoracic;  Laterality: N/A;    MEDICATIONS:  albuterol (VENTOLIN HFA) 108 (90 Base) MCG/ACT inhaler   BREZTRI AEROSPHERE 160-9-4.8 MCG/ACT AERO   dextromethorphan-guaiFENesin (MUCINEX DM) 30-600 MG 12hr tablet   furosemide (LASIX) 20 MG tablet   HYDROcodone-acetaminophen (NORCO/VICODIN) 5-325 MG tablet   Multiple Vitamins-Minerals (MULTI ADULT GUMMIES) CHEW   naphazoline-pheniramine (VISINE) 0.025-0.3 % ophthalmic solution   predniSONE (DELTASONE) 10 MG tablet   tamsulosin (FLOMAX) 0.4 MG CAPS capsule   XARELTO 20 MG TABS tablet   No current facility-administered medications for this encounter.     Jodell Cipro Ward, [name] [facility] [phone]

## 2023-04-18 ENCOUNTER — Encounter (HOSPITAL_COMMUNITY): Payer: Self-pay | Admitting: Internal Medicine

## 2023-04-18 ENCOUNTER — Inpatient Hospital Stay (HOSPITAL_COMMUNITY)
Admission: EM | Admit: 2023-04-18 | Discharge: 2023-05-05 | DRG: 196 | Disposition: A | Discharge status: Rehabilitation Facility | Attending: Internal Medicine | Admitting: Internal Medicine

## 2023-04-18 ENCOUNTER — Emergency Department (HOSPITAL_COMMUNITY)

## 2023-04-18 ENCOUNTER — Other Ambulatory Visit: Payer: Self-pay

## 2023-04-18 DIAGNOSIS — Z7901 Long term (current) use of anticoagulants: Secondary | ICD-10-CM

## 2023-04-18 DIAGNOSIS — R0789 Other chest pain: Secondary | ICD-10-CM

## 2023-04-18 DIAGNOSIS — E875 Hyperkalemia: Secondary | ICD-10-CM | POA: Diagnosis present

## 2023-04-18 DIAGNOSIS — J701 Chronic and other pulmonary manifestations due to radiation: Principal | ICD-10-CM | POA: Diagnosis present

## 2023-04-18 DIAGNOSIS — Z9221 Personal history of antineoplastic chemotherapy: Secondary | ICD-10-CM

## 2023-04-18 DIAGNOSIS — J700 Acute pulmonary manifestations due to radiation: Secondary | ICD-10-CM | POA: Diagnosis present

## 2023-04-18 DIAGNOSIS — E861 Hypovolemia: Secondary | ICD-10-CM | POA: Diagnosis present

## 2023-04-18 DIAGNOSIS — J440 Chronic obstructive pulmonary disease with acute lower respiratory infection: Secondary | ICD-10-CM | POA: Diagnosis present

## 2023-04-18 DIAGNOSIS — J441 Chronic obstructive pulmonary disease with (acute) exacerbation: Secondary | ICD-10-CM | POA: Diagnosis not present

## 2023-04-18 DIAGNOSIS — Z515 Encounter for palliative care: Secondary | ICD-10-CM

## 2023-04-18 DIAGNOSIS — J9811 Atelectasis: Secondary | ICD-10-CM | POA: Diagnosis present

## 2023-04-18 DIAGNOSIS — Z86711 Personal history of pulmonary embolism: Secondary | ICD-10-CM | POA: Diagnosis present

## 2023-04-18 DIAGNOSIS — D849 Immunodeficiency, unspecified: Secondary | ICD-10-CM | POA: Diagnosis present

## 2023-04-18 DIAGNOSIS — Z85118 Personal history of other malignant neoplasm of bronchus and lung: Secondary | ICD-10-CM

## 2023-04-18 DIAGNOSIS — J470 Bronchiectasis with acute lower respiratory infection: Secondary | ICD-10-CM | POA: Diagnosis present

## 2023-04-18 DIAGNOSIS — E7849 Other hyperlipidemia: Secondary | ICD-10-CM

## 2023-04-18 DIAGNOSIS — J9621 Acute and chronic respiratory failure with hypoxia: Secondary | ICD-10-CM | POA: Diagnosis not present

## 2023-04-18 DIAGNOSIS — J449 Chronic obstructive pulmonary disease, unspecified: Secondary | ICD-10-CM | POA: Diagnosis present

## 2023-04-18 DIAGNOSIS — C3491 Malignant neoplasm of unspecified part of right bronchus or lung: Secondary | ICD-10-CM | POA: Diagnosis not present

## 2023-04-18 DIAGNOSIS — E66811 Obesity, class 1: Secondary | ICD-10-CM | POA: Diagnosis present

## 2023-04-18 DIAGNOSIS — Z888 Allergy status to other drugs, medicaments and biological substances status: Secondary | ICD-10-CM

## 2023-04-18 DIAGNOSIS — Z1152 Encounter for screening for COVID-19: Secondary | ICD-10-CM

## 2023-04-18 DIAGNOSIS — J151 Pneumonia due to Pseudomonas: Secondary | ICD-10-CM | POA: Diagnosis present

## 2023-04-18 DIAGNOSIS — Z6831 Body mass index (BMI) 31.0-31.9, adult: Secondary | ICD-10-CM

## 2023-04-18 DIAGNOSIS — Z9981 Dependence on supplemental oxygen: Secondary | ICD-10-CM

## 2023-04-18 DIAGNOSIS — Z7952 Long term (current) use of systemic steroids: Secondary | ICD-10-CM

## 2023-04-18 DIAGNOSIS — R0602 Shortness of breath: Secondary | ICD-10-CM | POA: Diagnosis not present

## 2023-04-18 DIAGNOSIS — N400 Enlarged prostate without lower urinary tract symptoms: Secondary | ICD-10-CM | POA: Diagnosis present

## 2023-04-18 DIAGNOSIS — Z800 Family history of malignant neoplasm of digestive organs: Secondary | ICD-10-CM

## 2023-04-18 DIAGNOSIS — Z87891 Personal history of nicotine dependence: Secondary | ICD-10-CM

## 2023-04-18 DIAGNOSIS — I2782 Chronic pulmonary embolism: Secondary | ICD-10-CM | POA: Diagnosis present

## 2023-04-18 DIAGNOSIS — G43709 Chronic migraine without aura, not intractable, without status migrainosus: Secondary | ICD-10-CM | POA: Insufficient documentation

## 2023-04-18 DIAGNOSIS — I129 Hypertensive chronic kidney disease with stage 1 through stage 4 chronic kidney disease, or unspecified chronic kidney disease: Secondary | ICD-10-CM | POA: Diagnosis present

## 2023-04-18 DIAGNOSIS — Z1623 Resistance to quinolones and fluoroquinolones: Secondary | ICD-10-CM | POA: Diagnosis present

## 2023-04-18 DIAGNOSIS — K5900 Constipation, unspecified: Secondary | ICD-10-CM | POA: Diagnosis not present

## 2023-04-18 DIAGNOSIS — Z923 Personal history of irradiation: Secondary | ICD-10-CM

## 2023-04-18 DIAGNOSIS — I700 Atherosclerosis of aorta: Secondary | ICD-10-CM | POA: Diagnosis present

## 2023-04-18 DIAGNOSIS — E669 Obesity, unspecified: Secondary | ICD-10-CM | POA: Insufficient documentation

## 2023-04-18 DIAGNOSIS — J90 Pleural effusion, not elsewhere classified: Secondary | ICD-10-CM | POA: Diagnosis present

## 2023-04-18 DIAGNOSIS — E785 Hyperlipidemia, unspecified: Secondary | ICD-10-CM | POA: Diagnosis present

## 2023-04-18 DIAGNOSIS — Y842 Radiological procedure and radiotherapy as the cause of abnormal reaction of the patient, or of later complication, without mention of misadventure at the time of the procedure: Secondary | ICD-10-CM | POA: Diagnosis present

## 2023-04-18 DIAGNOSIS — J8410 Pulmonary fibrosis, unspecified: Secondary | ICD-10-CM | POA: Insufficient documentation

## 2023-04-18 DIAGNOSIS — K219 Gastro-esophageal reflux disease without esophagitis: Secondary | ICD-10-CM | POA: Diagnosis present

## 2023-04-18 LAB — CBC WITH DIFFERENTIAL/PLATELET
Abs Immature Granulocytes: 0.05 10*3/uL (ref 0.00–0.07)
Basophils Absolute: 0 10*3/uL (ref 0.0–0.1)
Basophils Relative: 0 %
Eosinophils Absolute: 0.1 10*3/uL (ref 0.0–0.5)
Eosinophils Relative: 1 %
HCT: 44.3 % (ref 39.0–52.0)
Hemoglobin: 13.3 g/dL (ref 13.0–17.0)
Immature Granulocytes: 1 %
Lymphocytes Relative: 6 %
Lymphs Abs: 0.5 10*3/uL — ABNORMAL LOW (ref 0.7–4.0)
MCH: 28.6 pg (ref 26.0–34.0)
MCHC: 30 g/dL (ref 30.0–36.0)
MCV: 95.3 fL (ref 80.0–100.0)
Monocytes Absolute: 0.7 10*3/uL (ref 0.1–1.0)
Monocytes Relative: 8 %
Neutro Abs: 7.1 10*3/uL (ref 1.7–7.7)
Neutrophils Relative %: 84 %
Platelets: 230 10*3/uL (ref 150–400)
RBC: 4.65 MIL/uL (ref 4.22–5.81)
RDW: 14.4 % (ref 11.5–15.5)
WBC: 8.5 10*3/uL (ref 4.0–10.5)
nRBC: 0 % (ref 0.0–0.2)

## 2023-04-18 LAB — COMPREHENSIVE METABOLIC PANEL
ALT: 16 U/L (ref 0–44)
AST: 22 U/L (ref 15–41)
Albumin: 3.4 g/dL — ABNORMAL LOW (ref 3.5–5.0)
Alkaline Phosphatase: 77 U/L (ref 38–126)
Anion gap: 9 (ref 5–15)
BUN: 19 mg/dL (ref 8–23)
CO2: 33 mmol/L — ABNORMAL HIGH (ref 22–32)
Calcium: 9.2 mg/dL (ref 8.9–10.3)
Chloride: 97 mmol/L — ABNORMAL LOW (ref 98–111)
Creatinine, Ser: 0.94 mg/dL (ref 0.61–1.24)
GFR, Estimated: >60 mL/min (ref >60)
Glucose, Bld: 106 mg/dL — ABNORMAL HIGH (ref 70–99)
Potassium: 4.5 mmol/L (ref 3.5–5.1)
Sodium: 139 mmol/L (ref 135–145)
Total Bilirubin: 0.5 mg/dL (ref 0.0–1.2)
Total Protein: 7.3 g/dL (ref 6.5–8.1)

## 2023-04-18 LAB — BRAIN NATRIURETIC PEPTIDE: B Natriuretic Peptide: 86.5 pg/mL (ref 0.0–100.0)

## 2023-04-18 LAB — TROPONIN I (HIGH SENSITIVITY)
Troponin I (High Sensitivity): 13 ng/L (ref <18)
Troponin I (High Sensitivity): 14 ng/L (ref <18)

## 2023-04-18 LAB — RESP PANEL BY RT-PCR (RSV, FLU A&B, COVID)  RVPGX2
Influenza A by PCR: NEGATIVE
Influenza B by PCR: NEGATIVE
Resp Syncytial Virus by PCR: NEGATIVE
SARS Coronavirus 2 by RT PCR: NEGATIVE

## 2023-04-18 MED ORDER — IOHEXOL 350 MG/ML SOLN
100.0000 mL | Freq: Once | INTRAVENOUS | Status: AC | PRN
  Administered 2023-04-18: 100 mL via INTRAVENOUS

## 2023-04-18 MED ORDER — SODIUM CHLORIDE 0.9 % IV SOLN: 250.0000 mL | INTRAVENOUS | Status: AC | PRN

## 2023-04-18 MED ORDER — IBUPROFEN 200 MG PO TABS
400.0000 mg | ORAL_TABLET | Freq: Four times a day (QID) | ORAL | Status: DC | PRN
  Administered 2023-04-20: 400 mg via ORAL
  Filled 2023-04-18: qty 2

## 2023-04-18 MED ORDER — SODIUM CHLORIDE 0.9 % IV BOLUS
500.0000 mL | Freq: Once | INTRAVENOUS | Status: AC
  Administered 2023-04-18: 500 mL via INTRAVENOUS

## 2023-04-18 MED ORDER — SODIUM CHLORIDE 0.9 % IV BOLUS
1000.0000 mL | Freq: Once | INTRAVENOUS | Status: AC
  Administered 2023-04-18: 1000 mL via INTRAVENOUS

## 2023-04-18 MED ORDER — SODIUM CHLORIDE 0.9% FLUSH: 3.0000 mL | INTRAVENOUS | Status: DC | PRN

## 2023-04-18 MED ORDER — SODIUM CHLORIDE 0.9 % IV SOLN
2.0000 g | INTRAVENOUS | Status: DC
  Administered 2023-04-18 – 2023-04-21 (×4): 2 g via INTRAVENOUS
  Filled 2023-04-18 (×4): qty 20

## 2023-04-18 MED ORDER — RIVAROXABAN 20 MG PO TABS: 20.0000 mg | ORAL_TABLET | Freq: Every day | ORAL | Status: DC

## 2023-04-18 MED ORDER — ALBUTEROL SULFATE (2.5 MG/3ML) 0.083% IN NEBU
10.0000 mg/h | INHALATION_SOLUTION | RESPIRATORY_TRACT | Status: DC
  Administered 2023-04-18: 10 mg/h via RESPIRATORY_TRACT
  Filled 2023-04-18: qty 3

## 2023-04-18 MED ORDER — IPRATROPIUM-ALBUTEROL 0.5-2.5 (3) MG/3ML IN SOLN
3.0000 mL | RESPIRATORY_TRACT | Status: DC | PRN
  Administered 2023-04-19: 3 mL via RESPIRATORY_TRACT
  Filled 2023-04-18: qty 3

## 2023-04-18 MED ORDER — SODIUM CHLORIDE 0.9% FLUSH
3.0000 mL | Freq: Two times a day (BID) | INTRAVENOUS | Status: DC
  Administered 2023-04-18 – 2023-05-05 (×21): 3 mL via INTRAVENOUS

## 2023-04-18 MED ORDER — ONDANSETRON HCL 4 MG/2ML IJ SOLN
4.0000 mg | Freq: Four times a day (QID) | INTRAMUSCULAR | Status: DC | PRN
Start: 2023-04-18 — End: 2023-05-05
  Administered 2023-05-02: 4 mg via INTRAVENOUS
  Filled 2023-04-18: qty 2

## 2023-04-18 MED ORDER — GUAIFENESIN ER 600 MG PO TB12
600.0000 mg | ORAL_TABLET | Freq: Two times a day (BID) | ORAL | Status: DC
  Administered 2023-04-18 – 2023-05-05 (×34): 600 mg via ORAL
  Filled 2023-04-18 (×34): qty 1

## 2023-04-18 MED ORDER — ONDANSETRON HCL 4 MG PO TABS: 4.0000 mg | ORAL_TABLET | Freq: Four times a day (QID) | ORAL | Status: DC | PRN

## 2023-04-18 MED ORDER — AZITHROMYCIN 500 MG IV SOLR
500.0000 mg | INTRAVENOUS | Status: DC
  Administered 2023-04-19: 500 mg via INTRAVENOUS
  Filled 2023-04-18: qty 5

## 2023-04-18 MED ORDER — METHYLPREDNISOLONE SODIUM SUCC 125 MG IJ SOLR
60.0000 mg | Freq: Two times a day (BID) | INTRAMUSCULAR | Status: DC
  Administered 2023-04-19 – 2023-04-23 (×9): 60 mg via INTRAVENOUS
  Filled 2023-04-18 (×9): qty 2

## 2023-04-18 MED ORDER — RIVAROXABAN 20 MG PO TABS
20.0000 mg | ORAL_TABLET | Freq: Every day | ORAL | Status: DC
  Administered 2023-04-19 – 2023-05-04 (×16): 20 mg via ORAL
  Filled 2023-04-18 (×16): qty 1

## 2023-04-18 MED ORDER — METHYLPREDNISOLONE SODIUM SUCC 125 MG IJ SOLR
125.0000 mg | Freq: Once | INTRAMUSCULAR | Status: AC
  Administered 2023-04-18: 125 mg via INTRAVENOUS
  Filled 2023-04-18: qty 2

## 2023-04-18 MED ORDER — BUDESON-GLYCOPYRROL-FORMOTEROL 160-9-4.8 MCG/ACT IN AERO: 2.0000 | INHALATION_SPRAY | Freq: Two times a day (BID) | RESPIRATORY_TRACT | Status: DC

## 2023-04-18 MED ORDER — SODIUM CHLORIDE 0.9 % IV SOLN
500.0000 mg | Freq: Once | INTRAVENOUS | Status: AC
  Administered 2023-04-18: 500 mg via INTRAVENOUS
  Filled 2023-04-18: qty 5

## 2023-04-18 MED ORDER — SODIUM CHLORIDE 0.9% FLUSH
3.0000 mL | Freq: Two times a day (BID) | INTRAVENOUS | Status: DC
  Administered 2023-04-18 – 2023-05-05 (×31): 3 mL via INTRAVENOUS

## 2023-04-18 MED ORDER — TAMSULOSIN HCL 0.4 MG PO CAPS
0.4000 mg | ORAL_CAPSULE | Freq: Every day | ORAL | Status: DC
  Administered 2023-04-18 – 2023-05-04 (×17): 0.4 mg via ORAL
  Filled 2023-04-18 (×17): qty 1

## 2023-04-18 NOTE — ED Provider Notes (Signed)
 [facility] EMERGENCY DEPARTMENT AT [facility] Provider Note   CSN: [id number] Arrival date & time: 04/18/23  1833     History  Chief Complaint  Patient presents with   Shortness of Breath    Philip [name] is a 68 y.o. male.   Shortness of Breath Associated symptoms: chest pain   Patient is a 68 year old male presents the ED today complaining of shortness of breath, right-sided chest pain x 2 weeks that has been increasing in severity.  Reports having to increase his home oxygen to 6 L/min and change in sputum color, noting that it feels like previous pneumonia.  Per his medical history of lung cancer, PE on Eliquis, pneumothorax, COPD, COPD, chronic kidney disease, ureteral stent placement.  States that he had come off his Eliquis due to having a ureteral stent placement tomorrow where he has been off it for 1 day. Reports that his cancer is currently being monitored as "not growing" but has not been said to be in remission at this time.  Last chemotherapy and radiation therapy was done 2 years ago.  Also had immunotherapy done as well.  Endorses dysuria, fatigue Denies fever, abdominal pain, nausea, vomiting, diarrhea, hematuria, hematochezia, melena, lower leg swelling     Home Medications Prior to Admission medications   Medication Sig Start Date End Date Taking? Authorizing Provider  albuterol (VENTOLIN HFA) 108 (90 Base) MCG/ACT inhaler Inhale 2 puffs into the lungs every 4 (four) hours as needed for wheezing or shortness of breath. 01/05/22   Hunsucker, Lesia Sago, MD  BREZTRI AEROSPHERE 160-9-4.8 MCG/ACT AERO INHALE 2 PUFFS INTO THE LUNGS IN THE MORNING AND AT BEDTIME. 09/18/22   Hunsucker, Lesia Sago, MD  dextromethorphan-guaiFENesin [facility] DM) 30-600 MG 12hr tablet Take 1 tablet by mouth 2 (two) times daily as needed for cough. 03/17/20   Rhetta Mura, MD  furosemide (LASIX) 20 MG tablet Take 1 tablet (20 mg total) by mouth daily as needed. Patient  taking differently: Take 20 mg by mouth in the morning. 03/12/23   Hunsucker, Lesia Sago, MD  HYDROcodone-acetaminophen (NORCO/VICODIN) 5-325 MG tablet Take 1 tablet by mouth every 12 (twelve) hours as needed for moderate pain (pain score 4-6). 04/05/23   Pickenpack-Cousar, Arty Baumgartner, NP  Multiple Vitamins-Minerals (MULTI ADULT GUMMIES) CHEW Chew 1 tablet by mouth daily. 09/02/21   [provider]  naphazoline-pheniramine (VISINE) 0.025-0.3 % ophthalmic solution Place 1 drop into both eyes daily as needed for eye irritation.    [provider]  predniSONE (DELTASONE) 10 MG tablet Take 1 tablet (10 mg total) by mouth daily with breakfast. 07/30/22   Hunsucker, Lesia Sago, MD  tamsulosin (FLOMAX) 0.4 MG CAPS capsule Take 0.4 mg by mouth at bedtime. 10/31/21   [provider]  XARELTO 20 MG TABS tablet TAKE 1 TABLET BY MOUTH DAILY WITH SUPPER. 06/17/22   Hunsucker, Lesia Sago, MD      Allergies    Carboplatin, Klonopin [clonazepam], and Norco [hydrocodone-acetaminophen]    Review of Systems   Review of Systems  Respiratory:  Positive for shortness of breath.   Cardiovascular:  Positive for chest pain.  Genitourinary:  Positive for dysuria.  All other systems reviewed and are negative.   Physical Exam Updated Vital Signs BP 124/85   Pulse (!) 119   Temp 97.8 F (36.6 C) (Oral)   Resp (!) 24   SpO2 93%  Physical Exam Vitals and nursing note reviewed.  Constitutional:      Appearance: Normal appearance.  He is ill-appearing.  HENT:     Head: Normocephalic and atraumatic.  Eyes:     General: No scleral icterus.       Right eye: No discharge.        Left eye: No discharge.     Extraocular Movements: Extraocular movements intact.     Conjunctiva/sclera: Conjunctivae normal.  Cardiovascular:     Rate and Rhythm: Normal rate and regular rhythm.     Pulses: Normal pulses.     Heart sounds: Normal heart sounds. No murmur heard.    No friction rub. No gallop.  Pulmonary:      Effort: Pulmonary effort is normal. No respiratory distress.     Breath sounds: Rales present. No wheezing.  Abdominal:     General: Abdomen is flat. There is no distension.     Palpations: Abdomen is soft.     Tenderness: There is no abdominal tenderness. There is no guarding.  Skin:    General: Skin is warm and dry.  Neurological:     General: No focal deficit present.     Mental Status: He is alert. Mental status is at baseline.  Psychiatric:        Mood and Affect: Mood normal.     ED Results / Procedures / Treatments   Labs (all labs ordered are listed, but only abnormal results are displayed) Labs Reviewed  CBC WITH DIFFERENTIAL/PLATELET - Abnormal; Notable for the following components:      Result Value   Lymphs Abs 0.5 (*)    All other components within normal limits  COMPREHENSIVE METABOLIC PANEL - Abnormal; Notable for the following components:   Chloride 97 (*)    CO2 33 (*)    Glucose, Bld 106 (*)    Albumin 3.4 (*)    All other components within normal limits  RESP PANEL BY RT-PCR (RSV, FLU A&B, COVID)  RVPGX2  BRAIN NATRIURETIC PEPTIDE  URINALYSIS, ROUTINE W REFLEX MICROSCOPIC  TROPONIN I (HIGH SENSITIVITY)    EKG None  Radiology DG Chest 2 View Result Date: 04/18/2023 CLINICAL DATA:  Shortness of breath and right-sided chest pain. EXAM: CHEST - 2 VIEW COMPARISON:  March 25, 2022 FINDINGS: The cardiac silhouette is enlarged and unchanged in size. 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 Stable mild to moderate severity areas of linear scarring and/or atelectasis are seen within the mid to upper lung fields, bilaterally. A stable small to moderate sized right-sided pleural effusion is noted. No pneumothorax is identified. Multilevel degenerative changes seen throughout the thoracic spine. IMPRESSION: 1. Stable cardiomegaly with emphysematous lung disease and mild to moderate severity areas of bilateral linear scarring and/or atelectasis. 2. Stable  small to moderate sized right-sided pleural effusion. Electronically Signed   By: Aram Candela M.D.   On: 04/18/2023 19:57    Procedures Procedures    Medications Ordered in ED Medications  sodium chloride 0.9 % bolus 500 mL (500 mLs Intravenous New Bag/Given 04/18/23 1956)    ED Course/ Medical Decision Making/ A&P                                 Medical Decision Making  Patient is a 68 year old male presents the ED today complaining of shortness of breath, right-sided chest pain x 2 weeks that has been increasing in severity.  Reports having to increase his home oxygen to 6 L/min and change in sputum color, noting that it feels like  previous pneumonia.  Per his medical history of lung cancer, PE on Eliquis, pneumothorax, COPD, COPD, chronic kidney disease, ureteral stent placement.  States that he had come off his Eliquis due to having a ureteral stent placement tomorrow where he has been off it for 1 day. Reports that his cancer is currently being monitored as "not growing" but has not been said to be in remission at this time.  Last chemotherapy and radiation therapy was done 2 years ago.  Also had immunotherapy done as well.  On physical exam, patient is noted to be mildly tachypneic as well as tachycardic but afebrile.  He does not seem to be in acute distress.  Patient is noted to have rales bilaterally on lung exam.  Belly appeared to be distended however when discussing with patient this is his baseline.  Patient is currently on 6 L of oxygen and satting well at 93%.  Tachycardia has also come down to having BPM of 101-107.  CBC is unremarkable, CMP unremarkable, initial troponin unremarkable, BMP unremarkable, respiratory panel unremarkable.   Chest x-ray pending CT angio for PE pending UA pending Delta troponin pending.   Patient care is then transferred over to Dr. Gerhard Munch.   Differential diagnoses prior to evaluation: The emergent differential diagnosis  includes, but is not limited to, pneumonia, ACS, PE, heart failure, COPD exacerbation, aortic dissection, bronchitis, URI, pleural effusion, pneumothorax. This is not an exhaustive differential.   Past Medical History / Co-morbidities / Social History: Status bronchoscopy, pneumothorax, lung cancer, pneumonia, COPD, GERD, PE, CKD  Additional history: Chart reviewed. Pertinent results include:   Scheduled for cystoscopy with right stent placement and retrograde pyelogram for tomorrow.  Last echo was done on 06/17/2019 where he was noted to have ejection fraction of 60 to 65%.   Lab Tests/Imaging studies: I personally interpreted labs/imaging and the pertinent results include:   CBC unremarkable CMP is unremarkable Respiratory panel negative BMP negative Troponin initial negative UA pending, chest x-ray pending CT angio pending  Cardiac monitoring: EKG obtained and interpreted by myself and attending physician which shows: Pending   Medications: Patient was ordered 500 mL of normal saline.  I have reviewed the patients home medicines and have made adjustments as needed.  Disposition: 8:16 PM Care of Yida Hyams transferred to Dr. Jeraldine Loots at the end of my shift as the patient will require reassessment once labs/imaging have resulted. Patient presentation, ED course, and plan of care discussed with review of all pertinent labs and imaging. Please see his/her note for further details regarding further ED course and disposition.   Plan at time of handoff is await for return of labs, evaluate pneumonia versus PE and provide treatment, anticipate admission. This may be altered or completely changed at the discretion of the oncoming team pending results of further workup.   Final Clinical Impression(s) / ED Diagnoses Final diagnoses:  SOB (shortness of breath)    Rx / DC Orders ED Discharge Orders     None         Lavonia Drafts 04/18/23 2016    Gerhard Munch,  MD 04/18/23 2344

## 2023-04-18 NOTE — H&P (Signed)
 History and Physical    Philip [name] QMV:[id number] DOB: 10-Nov-1955 DOA: 04/18/2023  PCP: Farris Has, MD   Patient coming from: Home   Chief Complaint:  Chief Complaint  Patient presents with   Shortness of Breath   ED TRIAGE note:  Pt arrives c/o two weeks of worsening SOB and R sided CP. Pt states that he has been having to increase his home oxygen use, and currently arrives on 6 lpm via simple mask. Pt states that he feels as he has in the past when he's been diagnosed with PNA. No recent fevers per pt.             HPI:  Philip [name] is a 68 y.o. 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 Patient reporting increasing oxygen 6 L/min without any much improvement of the shortness of breath having sputum production. Patient is also endorsing dysuria and fatigue.  Denies any fever, chill, abdomen pain, nausea, vomiting, diarrhea diarrhea, hematochezia, hematuria, melena and lower extremity swelling.  Patient reported already taking Xarelto today 3/16 evening around 6 PM.  Patient has been scheduled for cystoscopy with right-sided ureteral stent replacement tomorrow 04/18/2023 however I doubt that in the setting of acute hypoxic respiratory failure and COPD exacerbation urology will proceed with this stent replacement.  ED Course:  At presentation to ED patient is tachypneic 24, tachycardic 119, blood pressure within good range 124/85 and O2 sat 93% on 6 L mask oxygen. Normal troponin levels 13 and 14 without any  delta change. Respiratory panel negative for COVID, RSV and flu. Pending UA. Normal BNP 46. CMP unremarkable except elevated bicarb 33 and low albumin 3.4. CBC unremarkable.  Chest x-ray showing stable cardiomegaly with  emphysematous lung disease and mild to moderate severity areas of bilateral linear scarring and/or atelectasis. 2. Stable small to moderate sized right-sided pleural effusion.    CTA chest no evidence of pulmonary embolism.  Similar post treatment/post radiation change with dense right-greater-than-left perihilar fibrosis and volume loss. 3. Ground-glass opacification of both lungs appears increased compared to 03/26/2023. This may be related to differences in inspiratory effort however infection or edema are difficult to exclude. 4. Similar small right pleural effusion and trace left pleural effusion, similar to prior. 5. Aortic Atherosclerosis (ICD10-I70.0).   With the concern underlying fibrosis with COPD exacerbation in the ED patient has been treated with Solu-Medrol, azithromycin. Dr. Jeraldine Loots reported that while patient has been talking O2 sat dropped to 83% currently on 6 L oxygen.  Hospitalist has been consulted for further evaluation management of COPD exacerbation.  Significant labs in the ED: Lab Orders         Resp panel by RT-PCR (RSV, Flu A&B, Covid) Anterior Nasal Swab         Respiratory (~20 pathogens) panel by PCR         Culture, blood (Routine X 2) w Reflex to ID Panel         Expectorated Sputum Assessment w Gram Stain, Rflx to Resp Cult         CBC with Differential  Comprehensive metabolic panel         Urinalysis, Routine w reflex microscopic -Urine, Clean Catch         Brain natriuretic peptide         HIV Antibody (routine testing w rflx)         Comprehensive metabolic panel         CBC         Protime-INR         APTT         Procalcitonin         Strep pneumoniae urinary antigen         Legionella Pneumophila Serogp 1  Ur Ag       Review of Systems:  Review of Systems  Constitutional:  Negative for chills, fever, malaise/fatigue and weight loss.  HENT:  Negative for congestion, sinus pain and sore throat.   Respiratory:  Positive for cough, sputum production, shortness of breath and wheezing. Negative for hemoptysis and stridor.   Cardiovascular:  Positive for chest pain. Negative for palpitations, orthopnea, claudication and leg swelling.       Right-sided chest wall pain  Gastrointestinal:  Negative for abdominal pain, heartburn, nausea and vomiting.  Genitourinary:  Negative for dysuria, flank pain, frequency, hematuria and urgency.  Musculoskeletal:  Negative for back pain, joint pain, myalgias and neck pain.  Skin:  Negative for itching and rash.  Neurological:  Positive for dizziness.  Endo/Heme/Allergies:  Negative for environmental allergies. Does not bruise/bleed easily.  Psychiatric/Behavioral:  Positive for depression. The patient is nervous/anxious.   All other systems reviewed and are negative.   Past Medical History:  Diagnosis Date   Chronic kidney disease    COPD (chronic obstructive pulmonary disease) (HCC)    Dyspnea    GERD (gastroesophageal reflux disease)    Headache    migraines   Hyperlipidemia    lung ca dx'd 12/2018   On home oxygen therapy    3 to 10 L   Pneumonia    Pulmonary embolus (HCC)    Tobacco abuse     Past Surgical History:  Procedure Laterality Date   COLONOSCOPY     CYSTOSCOPY W/ URETERAL STENT PLACEMENT Right 10/24/2021   Procedure: CYSTOSCOPY WITH RETROGRADE PYELOGRAM/URETERAL STENT PLACEMENT;  Surgeon: Jannifer Hick, MD;  Location: [facility];  Service: Urology;  Laterality: Right;   CYSTOSCOPY WITH RETROGRADE PYELOGRAM, URETEROSCOPY AND STENT PLACEMENT Right 10/12/2022   Procedure: CYSTOSCOPY WITH RIGHT  RETROGRADE PYELOGRAM, RIGHT STENT EXCHANGE;  Surgeon: Jannifer Hick, MD;  Location: [facility];  Service: Urology;  Laterality: Right;   HAND SURGERY      VASECTOMY     VIDEO BRONCHOSCOPY WITH ENDOBRONCHIAL ULTRASOUND N/A 12/22/2018   Procedure: VIDEO BRONCHOSCOPY WITH ENDOBRONCHIAL ULTRASOUND;  Surgeon: Josephine Igo, DO;  Location: [facility];  Service: Thoracic;  Laterality: N/A;   VIDEO BRONCHOSCOPY WITH ENDOBRONCHIAL ULTRASOUND N/A 01/09/2019   Procedure: VIDEO BRONCHOSCOPY WITH ENDOBRONCHIAL ULTRASOUND WITH FLUORO;  Surgeon: Josephine Igo, DO;  Location: [facility];  Service: Thoracic;  Laterality: N/A;   VIDEO BRONCHOSCOPY WITH RADIAL ENDOBRONCHIAL ULTRASOUND N/A 12/22/2018   Procedure: RADIAL ENDOBRONCHIAL ULTRASOUND;  Surgeon: Josephine Igo, DO;  Location: [facility];  Service: Thoracic;  Laterality: N/A;     reports that he quit smoking about 16 months ago. His smoking use included cigarettes. He started smoking about 52 years ago. He has a 102.8 pack-year smoking history. He has quit using smokeless tobacco.  His smokeless tobacco  use included chew. He reports current alcohol use. He reports that he does not currently use drugs.  Allergies  Allergen Reactions   Carboplatin Anaphylaxis, Shortness Of Breath, Cough and Hypertension    On dose 8. Became short of breath. Symptom management was called. Received benadryl, solumedrol, Pepcid and fluids. Medication discontinued.    Klonopin [Clonazepam] Other (See Comments)    nervous   Norco [Hydrocodone-Acetaminophen] Other (See Comments)    Made pt feel jittery     Family History  Problem Relation Age of Onset   Cancer Father    Stomach cancer Father     Prior to Admission medications   Medication Sig Start Date End Date Taking? Authorizing Provider  albuterol (VENTOLIN HFA) 108 (90 Base) MCG/ACT inhaler Inhale 2 puffs into the lungs every 4 (four) hours as needed for wheezing or shortness of breath. 01/05/22   Hunsucker, Lesia Sago, MD  BREZTRI AEROSPHERE 160-9-4.8 MCG/ACT AERO INHALE 2 PUFFS INTO THE LUNGS IN THE MORNING AND AT BEDTIME. 09/18/22   Hunsucker, Lesia Sago, MD   dextromethorphan-guaiFENesin [facility] DM) 30-600 MG 12hr tablet Take 1 tablet by mouth 2 (two) times daily as needed for cough. 03/17/20   Rhetta Mura, MD  furosemide (LASIX) 20 MG tablet Take 1 tablet (20 mg total) by mouth daily as needed. Patient taking differently: Take 20 mg by mouth in the morning. 03/12/23   Hunsucker, Lesia Sago, MD  HYDROcodone-acetaminophen (NORCO/VICODIN) 5-325 MG tablet Take 1 tablet by mouth every 12 (twelve) hours as needed for moderate pain (pain score 4-6). 04/05/23   Pickenpack-Cousar, Arty Baumgartner, NP  Multiple Vitamins-Minerals (MULTI ADULT GUMMIES) CHEW Chew 1 tablet by mouth daily. 09/02/21   [provider]  naphazoline-pheniramine (VISINE) 0.025-0.3 % ophthalmic solution Place 1 drop into both eyes daily as needed for eye irritation.    [provider]  predniSONE (DELTASONE) 10 MG tablet Take 1 tablet (10 mg total) by mouth daily with breakfast. 07/30/22   Hunsucker, Lesia Sago, MD  tamsulosin (FLOMAX) 0.4 MG CAPS capsule Take 0.4 mg by mouth at bedtime. 10/31/21   [provider]  XARELTO 20 MG TABS tablet TAKE 1 TABLET BY MOUTH DAILY WITH SUPPER. 06/17/22   Hunsucker, Lesia Sago, MD     Physical Exam: Vitals:   04/18/23 1838  BP: 124/85  Pulse: (!) 119  Resp: (!) 24  Temp: 97.8 F (36.6 C)  TempSrc: Oral  SpO2: 93%    Physical Exam Vitals reviewed.  Constitutional:      Appearance: He is ill-appearing.  HENT:     Mouth/Throat:     Mouth: Mucous membranes are moist.  Cardiovascular:     Rate and Rhythm: Normal rate and regular rhythm.  Pulmonary:     Effort: Pulmonary effort is normal.     Breath sounds: Examination of the right-upper field reveals wheezing. Examination of the left-upper field reveals wheezing. Examination of the right-middle field reveals wheezing. Examination of the left-middle field reveals wheezing. Wheezing present. No decreased breath sounds, rhonchi or rales.  Abdominal:     Palpations: Abdomen  is soft.  Skin:    General: Skin is dry.     Capillary Refill: Capillary refill takes less than 2 seconds.  Neurological:     Mental Status: He is alert and oriented to person, place, and time.  Psychiatric:        Mood and Affect: Mood is anxious.      Labs on Admission: I have personally reviewed following labs and imaging  studies  CBC: Recent Labs  Lab 04/16/23 1304 04/18/23 1905  WBC 9.4 8.5  NEUTROABS  --  7.1  HGB 13.7 13.3  HCT 46.3 44.3  MCV 95.5 95.3  PLT 266 230   Basic Metabolic Panel: Recent Labs  Lab 04/18/23 1905  NA 139  K 4.5  CL 97*  CO2 33*  GLUCOSE 106*  BUN 19  CREATININE 0.94  CALCIUM 9.2   GFR: Estimated Creatinine Clearance: 81.9 mL/min (by C-G formula based on SCr of 0.94 mg/dL). Liver Function Tests: Recent Labs  Lab 04/18/23 1905  AST 22  ALT 16  ALKPHOS 77  BILITOT 0.5  PROT 7.3  ALBUMIN 3.4*   No results for input(s): "LIPASE", "AMYLASE" in the last 168 hours. No results for input(s): "AMMONIA" in the last 168 hours. Coagulation Profile: No results for input(s): "INR", "PROTIME" in the last 168 hours. Cardiac Enzymes: Recent Labs  Lab 04/18/23 1905 04/18/23 2115  TROPONINIHS 13 14   BNP (last 3 results) Recent Labs    04/18/23 1905  BNP 86.5   HbA1C: No results for input(s): "HGBA1C" in the last 72 hours. CBG: No results for input(s): "GLUCAP" in the last 168 hours. Lipid Profile: No results for input(s): "CHOL", "HDL", "LDLCALC", "TRIG", "CHOLHDL", "LDLDIRECT" in the last 72 hours. Thyroid Function Tests: No results for input(s): "TSH", "T4TOTAL", "FREET4", "T3FREE", "THYROIDAB" in the last 72 hours. Anemia Panel: No results for input(s): "VITAMINB12", "FOLATE", "FERRITIN", "TIBC", "IRON", "RETICCTPCT" in the last 72 hours. Urine analysis:    Component Value Date/Time   COLORURINE YELLOW 12/22/2021 1200   APPEARANCEUR CLEAR 12/22/2021 1200   LABSPEC 1.015 12/22/2021 1200   PHURINE 5.0 12/22/2021 1200    GLUCOSEU NEGATIVE 12/22/2021 1200   HGBUR MODERATE (A) 12/22/2021 1200   BILIRUBINUR MODERATE (A) 12/22/2021 1200   KETONESUR NEGATIVE 12/22/2021 1200   PROTEINUR NEGATIVE 12/22/2021 1200   NITRITE NEGATIVE 12/22/2021 1200   LEUKOCYTESUR NEGATIVE 12/22/2021 1200    Radiological Exams on Admission: I have personally reviewed images CT Angio Chest PE W and/or Wo Contrast Result Date: 04/18/2023 CLINICAL DATA:  Positive D-dimer. PE suspected. Shortness of breath and right-sided chest pain. EXAM: CT ANGIOGRAPHY CHEST WITH CONTRAST TECHNIQUE: Multidetector CT imaging of the chest was performed using the standard protocol during bolus administration of intravenous contrast. Multiplanar CT image reconstructions and MIPs were obtained to evaluate the vascular anatomy. RADIATION DOSE REDUCTION: This exam was performed according to the departmental dose-optimization program which includes automated exposure control, adjustment of the mA and/or kV according to patient size and/or use of iterative reconstruction technique. CONTRAST:  OMNIPAQUE IOHEXOL 350 MG/ML SOLN COMPARISON:  Radiograph earlier today and CT 03/26/2023 FINDINGS: Cardiovascular: No pericardial effusion. No pulmonary embolism. Mild aortic and coronary artery atherosclerotic calcification. Mediastinum/Nodes: Saber sheath trachea. Esophagus is unremarkable. No thoracic adenopathy. Lungs/Pleura: Similar post treatment/post radiation change with dense right-greater-than-left perihilar fibrosis and volume loss. Ground-glass opacification of both lungs appears increased compared to 03/26/2023. Superimposed infection or edema is difficult to exclude. Similar small right pleural effusion and trace left pleural effusion. No pneumothorax. Upper Abdomen: No acute abnormality. Musculoskeletal: No acute fracture. Review of the MIP images confirms the above findings. IMPRESSION: 1. No pulmonary embolism. 2. Similar post treatment/post radiation change with  dense right-greater-than-left perihilar fibrosis and volume loss. 3. Ground-glass opacification of both lungs appears increased compared to 03/26/2023. This may be related to differences in inspiratory effort however infection or edema are difficult to exclude. 4. Similar small right pleural effusion  and trace left pleural effusion, similar to prior. 5. Aortic Atherosclerosis (ICD10-I70.0). Electronically Signed   By: Minerva Fester M.D.   On: 04/18/2023 21:01   DG Chest 2 View Result Date: 04/18/2023 CLINICAL DATA:  Shortness of breath and right-sided chest pain. EXAM: CHEST - 2 VIEW COMPARISON:  March 25, 2022 FINDINGS: The cardiac silhouette is enlarged and unchanged in size. 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 Stable mild to moderate severity areas of linear scarring and/or atelectasis are seen within the mid to upper lung fields, bilaterally. A stable small to moderate sized right-sided pleural effusion is noted. No pneumothorax is identified. Multilevel degenerative changes seen throughout the thoracic spine. IMPRESSION: 1. Stable cardiomegaly with emphysematous lung disease and mild to moderate severity areas of bilateral linear scarring and/or atelectasis. 2. Stable small to moderate sized right-sided pleural effusion. Electronically Signed   By: Aram Candela M.D.   On: 04/18/2023 19:57     EKG: My personal interpretation of EKG shows: Sinus tachycardia heart rate 115.    Assessment/Plan: Principal Problem:   Acute exacerbation of chronic obstructive pulmonary disease (COPD) (HCC) Active Problems:   Adenocarcinoma of right lung, stage 3 (HCC)   Acute on chronic hypoxic respiratory failure (HCC)   History of lung cancer   History of pulmonary embolism   BPH (benign prostatic hyperplasia)   Radiation-induced pulmonary fibrosis (HCC)   Chronic migraine without aura   Hyperlipidemia   COPD with acute exacerbation (HCC)   Chest pain, non-cardiac    Assessment and  Plan: Acute exacerbation of COPD Acute on chronic hypoxic respiratory failure History of radiation induced lung fibrosis Community-acquired pneumonia History of adenocarcinoma of the lung stage III/non-small cell lung cancer -Patient presenting with worsening shortness of breath nonproductive cough.  Denies any fever and chill.  Patient O2 sat dropped to 83% during conversation even though patient is on 6 L oxygen. -Follows [facility] pulmonology and on his oncology Dr. Arbutus Ped. -Patient is afebrile.  CBC no evidence of leukocytosis. -Respiratory panel negative for COVID, RSV and flu.  20 respiratory panel. - Chest x-ray showed stable cardiomegaly with emphysematous change, scarring and atelectasis.  Stable small to moderate right-sided pleural effusion. - CTA chest no evidence of pulmonary embolism.  Posttreatment/radiation change with dense right> left pulmonary fibrosis.  Groundglass opacities of the both lung increased compared to 03/25/2023.  It is difficult to exclude any infectious process. -Given patient has immunocompromised status with history of lung cancer, COPD and pulmonary fibrosis continue to treat with IV antibiotic for presumptive pneumonia.  Starting IV ceftriaxone and continue IV azithromycin - Checking blood culture, sputum culture, 20 respiratory panel, procalcitonin level, urine Legionella and urine strep antigen test - In the ED patient has been treated with IV Solu-Medrol and DuoNeb nebulizer and IV azithromycin 500 mg for COPD exacerbation. - Continue IV Solu-Medrol 60 mg twice daily, DuoNeb as needed, pulmonary toiletry, flutter valve.  Continue [place] oxygen and continuous pulse ox. -If procalcitonin level within normal range can de-escalate antibiotic.   Noncardiac right-sided chest pain - Patient complaining about right-sided chest wall pain. -History of right-sided lung adenocarcinoma.  Troponin x 2 unremarkable.  EKG there is no evidence of ST anterior abnormality.   Noncardiac chest pain in the setting of chronic lung cancer and COPD exacerbation.   History of pulmonary embolism -Patient did not take Xarelto today given he was initially was scheduled for right ureteral stent replacement surgery today.  Given patient has acute hypoxic respiratory failure COPD exacerbation I  doubt that urology would proceed with scheduled surgery today.  Resuming Xarelto from tonight.   History of chronic right-sided ureteral stent -Checking UA.  Normal renal function.  No evidence of AKI. Patient follows urology outpatient  Hyperlipidemia -Continue Lipitor  BPH -Continue Flomax  DVT prophylaxis:  Xarelto Code Status:  Full Code Diet: Heart healthy diet Family Communication:   Family was present at bedside, at the time of interview. Opportunity was given to ask question and all questions were answered satisfactorily.  Disposition Plan: Current treatment interval improvement of shortness of breath.  If procalcitonin level is negative, sputum and blood cultures are negative can de-escalate antibiotic. Consults: Respiratory care Admission status:   Observation, progressive unit  Severity of Illness: The appropriate patient status for this patient is OBSERVATION. Observation status is judged to be reasonable and necessary in order to provide the required intensity of service to ensure the patient's safety. The patient's presenting symptoms, physical exam findings, and initial radiographic and laboratory data in the context of their medical condition is felt to place them at decreased risk for further clinical deterioration. Furthermore, it is anticipated that the patient will be medically stable for discharge from the hospital within 2 midnights of admission.     Tereasa Coop, MD [facility]  How to contact the [facility] Attending or Consulting provider 7A - 7P or covering provider during after hours 7P -7A, for this patient.  Check the care team in [facility] and look  for a) attending/consulting [facility] provider listed and b) the [facility] team listed Log into [email or web address] and use [place]'s universal password to access. If you do not have the password, please contact the hospital operator. Locate the [facility] provider you are looking for under [facility] and page to a number that you can be directly reached. If you still have difficulty reaching the provider, please page the [facility] (Director on Call) for the Hospitalists listed on amion for assistance.  04/18/2023, 10:38 PM

## 2023-04-18 NOTE — ED Triage Notes (Signed)
 Pt arrives c/o two weeks of worsening SOB and R sided CP. Pt states that he has been having to increase his home oxygen use, and currently arrives on 6 lpm via simple mask. Pt states that he feels as he has in the past when he's been diagnosed with PNA. No recent fevers per pt.

## 2023-04-19 ENCOUNTER — Encounter (HOSPITAL_COMMUNITY)
Admission: EM | Disposition: A | Discharge status: Rehabilitation Facility | Payer: Self-pay | Source: Home / Self Care | Attending: Family Medicine

## 2023-04-19 ENCOUNTER — Ambulatory Visit (HOSPITAL_COMMUNITY)
Admission: RE | Admit: 2023-04-19 | Payer: No Typology Code available for payment source | Source: Home / Self Care | Admitting: Urology

## 2023-04-19 ENCOUNTER — Other Ambulatory Visit: Payer: Self-pay

## 2023-04-19 DIAGNOSIS — D849 Immunodeficiency, unspecified: Secondary | ICD-10-CM | POA: Diagnosis present

## 2023-04-19 DIAGNOSIS — Z66 Do not resuscitate: Secondary | ICD-10-CM | POA: Diagnosis not present

## 2023-04-19 DIAGNOSIS — Z1623 Resistance to quinolones and fluoroquinolones: Secondary | ICD-10-CM | POA: Diagnosis present

## 2023-04-19 DIAGNOSIS — J9811 Atelectasis: Secondary | ICD-10-CM | POA: Diagnosis present

## 2023-04-19 DIAGNOSIS — E861 Hypovolemia: Secondary | ICD-10-CM | POA: Diagnosis present

## 2023-04-19 DIAGNOSIS — D72829 Elevated white blood cell count, unspecified: Secondary | ICD-10-CM | POA: Diagnosis not present

## 2023-04-19 DIAGNOSIS — Z1152 Encounter for screening for COVID-19: Secondary | ICD-10-CM | POA: Diagnosis not present

## 2023-04-19 DIAGNOSIS — B965 Pseudomonas (aeruginosa) (mallei) (pseudomallei) as the cause of diseases classified elsewhere: Secondary | ICD-10-CM | POA: Diagnosis not present

## 2023-04-19 DIAGNOSIS — Z9981 Dependence on supplemental oxygen: Secondary | ICD-10-CM | POA: Diagnosis not present

## 2023-04-19 DIAGNOSIS — Z7952 Long term (current) use of systemic steroids: Secondary | ICD-10-CM | POA: Diagnosis not present

## 2023-04-19 DIAGNOSIS — J90 Pleural effusion, not elsewhere classified: Secondary | ICD-10-CM | POA: Diagnosis present

## 2023-04-19 DIAGNOSIS — E875 Hyperkalemia: Secondary | ICD-10-CM | POA: Diagnosis present

## 2023-04-19 DIAGNOSIS — Z7901 Long term (current) use of anticoagulants: Secondary | ICD-10-CM | POA: Diagnosis not present

## 2023-04-19 DIAGNOSIS — I2609 Other pulmonary embolism with acute cor pulmonale: Secondary | ICD-10-CM | POA: Diagnosis not present

## 2023-04-19 DIAGNOSIS — I272 Pulmonary hypertension, unspecified: Secondary | ICD-10-CM | POA: Diagnosis not present

## 2023-04-19 DIAGNOSIS — J9621 Acute and chronic respiratory failure with hypoxia: Secondary | ICD-10-CM | POA: Diagnosis present

## 2023-04-19 DIAGNOSIS — Z515 Encounter for palliative care: Secondary | ICD-10-CM | POA: Diagnosis not present

## 2023-04-19 DIAGNOSIS — J9600 Acute respiratory failure, unspecified whether with hypoxia or hypercapnia: Secondary | ICD-10-CM | POA: Diagnosis present

## 2023-04-19 DIAGNOSIS — R601 Generalized edema: Secondary | ICD-10-CM | POA: Diagnosis not present

## 2023-04-19 DIAGNOSIS — R202 Paresthesia of skin: Secondary | ICD-10-CM | POA: Diagnosis not present

## 2023-04-19 DIAGNOSIS — R600 Localized edema: Secondary | ICD-10-CM | POA: Diagnosis not present

## 2023-04-19 DIAGNOSIS — Y842 Radiological procedure and radiotherapy as the cause of abnormal reaction of the patient, or of later complication, without mention of misadventure at the time of the procedure: Secondary | ICD-10-CM | POA: Diagnosis present

## 2023-04-19 DIAGNOSIS — J701 Chronic and other pulmonary manifestations due to radiation: Secondary | ICD-10-CM | POA: Diagnosis present

## 2023-04-19 DIAGNOSIS — N400 Enlarged prostate without lower urinary tract symptoms: Secondary | ICD-10-CM | POA: Diagnosis present

## 2023-04-19 DIAGNOSIS — J470 Bronchiectasis with acute lower respiratory infection: Secondary | ICD-10-CM | POA: Diagnosis present

## 2023-04-19 DIAGNOSIS — M546 Pain in thoracic spine: Secondary | ICD-10-CM | POA: Diagnosis not present

## 2023-04-19 DIAGNOSIS — Z86711 Personal history of pulmonary embolism: Secondary | ICD-10-CM | POA: Diagnosis not present

## 2023-04-19 DIAGNOSIS — J700 Acute pulmonary manifestations due to radiation: Secondary | ICD-10-CM | POA: Diagnosis present

## 2023-04-19 DIAGNOSIS — I700 Atherosclerosis of aorta: Secondary | ICD-10-CM | POA: Diagnosis present

## 2023-04-19 DIAGNOSIS — M545 Low back pain, unspecified: Secondary | ICD-10-CM | POA: Diagnosis present

## 2023-04-19 DIAGNOSIS — J479 Bronchiectasis, uncomplicated: Secondary | ICD-10-CM | POA: Diagnosis present

## 2023-04-19 DIAGNOSIS — J9601 Acute respiratory failure with hypoxia: Secondary | ICD-10-CM | POA: Diagnosis not present

## 2023-04-19 DIAGNOSIS — Z9221 Personal history of antineoplastic chemotherapy: Secondary | ICD-10-CM | POA: Diagnosis not present

## 2023-04-19 DIAGNOSIS — J439 Emphysema, unspecified: Secondary | ICD-10-CM | POA: Diagnosis present

## 2023-04-19 DIAGNOSIS — J440 Chronic obstructive pulmonary disease with acute lower respiratory infection: Secondary | ICD-10-CM | POA: Diagnosis present

## 2023-04-19 DIAGNOSIS — R609 Edema, unspecified: Secondary | ICD-10-CM | POA: Diagnosis not present

## 2023-04-19 DIAGNOSIS — Z85118 Personal history of other malignant neoplasm of bronchus and lung: Secondary | ICD-10-CM | POA: Diagnosis not present

## 2023-04-19 DIAGNOSIS — Z923 Personal history of irradiation: Secondary | ICD-10-CM | POA: Diagnosis not present

## 2023-04-19 DIAGNOSIS — G8929 Other chronic pain: Secondary | ICD-10-CM | POA: Diagnosis present

## 2023-04-19 DIAGNOSIS — C3490 Malignant neoplasm of unspecified part of unspecified bronchus or lung: Secondary | ICD-10-CM | POA: Diagnosis not present

## 2023-04-19 DIAGNOSIS — R0602 Shortness of breath: Secondary | ICD-10-CM | POA: Diagnosis present

## 2023-04-19 DIAGNOSIS — J441 Chronic obstructive pulmonary disease with (acute) exacerbation: Secondary | ICD-10-CM | POA: Diagnosis present

## 2023-04-19 DIAGNOSIS — G629 Polyneuropathy, unspecified: Secondary | ICD-10-CM | POA: Diagnosis present

## 2023-04-19 DIAGNOSIS — K219 Gastro-esophageal reflux disease without esophagitis: Secondary | ICD-10-CM | POA: Diagnosis present

## 2023-04-19 DIAGNOSIS — J151 Pneumonia due to Pseudomonas: Secondary | ICD-10-CM | POA: Diagnosis present

## 2023-04-19 DIAGNOSIS — J8410 Pulmonary fibrosis, unspecified: Secondary | ICD-10-CM | POA: Diagnosis not present

## 2023-04-19 DIAGNOSIS — I2781 Cor pulmonale (chronic): Secondary | ICD-10-CM | POA: Diagnosis not present

## 2023-04-19 DIAGNOSIS — E66811 Obesity, class 1: Secondary | ICD-10-CM | POA: Diagnosis present

## 2023-04-19 DIAGNOSIS — J189 Pneumonia, unspecified organism: Secondary | ICD-10-CM | POA: Diagnosis not present

## 2023-04-19 DIAGNOSIS — E785 Hyperlipidemia, unspecified: Secondary | ICD-10-CM | POA: Diagnosis present

## 2023-04-19 DIAGNOSIS — E876 Hypokalemia: Secondary | ICD-10-CM | POA: Diagnosis not present

## 2023-04-19 DIAGNOSIS — I129 Hypertensive chronic kidney disease with stage 1 through stage 4 chronic kidney disease, or unspecified chronic kidney disease: Secondary | ICD-10-CM | POA: Diagnosis present

## 2023-04-19 DIAGNOSIS — R0789 Other chest pain: Secondary | ICD-10-CM | POA: Diagnosis not present

## 2023-04-19 DIAGNOSIS — J449 Chronic obstructive pulmonary disease, unspecified: Secondary | ICD-10-CM | POA: Diagnosis not present

## 2023-04-19 DIAGNOSIS — I2782 Chronic pulmonary embolism: Secondary | ICD-10-CM | POA: Diagnosis present

## 2023-04-19 DIAGNOSIS — R5381 Other malaise: Secondary | ICD-10-CM | POA: Diagnosis not present

## 2023-04-19 DIAGNOSIS — K5901 Slow transit constipation: Secondary | ICD-10-CM | POA: Diagnosis not present

## 2023-04-19 DIAGNOSIS — C3491 Malignant neoplasm of unspecified part of right bronchus or lung: Secondary | ICD-10-CM | POA: Diagnosis present

## 2023-04-19 DIAGNOSIS — K5900 Constipation, unspecified: Secondary | ICD-10-CM | POA: Diagnosis present

## 2023-04-19 LAB — URINALYSIS, ROUTINE W REFLEX MICROSCOPIC
Bacteria, UA: NONE SEEN
Bilirubin Urine: NEGATIVE
Glucose, UA: NEGATIVE mg/dL
Ketones, ur: NEGATIVE mg/dL
Leukocytes,Ua: NEGATIVE
Nitrite: NEGATIVE
Protein, ur: 30 mg/dL — AB
RBC / HPF: >50 RBC/hpf (ref 0–5)
Specific Gravity, Urine: 1.028 (ref 1.005–1.030)
pH: 6 (ref 5.0–8.0)

## 2023-04-19 LAB — COMPREHENSIVE METABOLIC PANEL
ALT: 14 U/L (ref 0–44)
AST: 17 U/L (ref 15–41)
Albumin: 3 g/dL — ABNORMAL LOW (ref 3.5–5.0)
Alkaline Phosphatase: 70 U/L (ref 38–126)
Anion gap: 8 (ref 5–15)
BUN: 15 mg/dL (ref 8–23)
CO2: 31 mmol/L (ref 22–32)
Calcium: 8.7 mg/dL — ABNORMAL LOW (ref 8.9–10.3)
Chloride: 101 mmol/L (ref 98–111)
Creatinine, Ser: 0.88 mg/dL (ref 0.61–1.24)
GFR, Estimated: >60 mL/min (ref >60)
Glucose, Bld: 154 mg/dL — ABNORMAL HIGH (ref 70–99)
Potassium: 4 mmol/L (ref 3.5–5.1)
Sodium: 140 mmol/L (ref 135–145)
Total Bilirubin: 0.4 mg/dL (ref 0.0–1.2)
Total Protein: 6.6 g/dL (ref 6.5–8.1)

## 2023-04-19 LAB — CBC
HCT: 41.8 % (ref 39.0–52.0)
Hemoglobin: 12.6 g/dL — ABNORMAL LOW (ref 13.0–17.0)
MCH: 29.2 pg (ref 26.0–34.0)
MCHC: 30.1 g/dL (ref 30.0–36.0)
MCV: 97 fL (ref 80.0–100.0)
Platelets: 219 10*3/uL (ref 150–400)
RBC: 4.31 MIL/uL (ref 4.22–5.81)
RDW: 14.1 % (ref 11.5–15.5)
WBC: 7.8 10*3/uL (ref 4.0–10.5)
nRBC: 0 % (ref 0.0–0.2)

## 2023-04-19 LAB — RESPIRATORY PANEL BY PCR

## 2023-04-19 LAB — PROTIME-INR
INR: 1.7 — ABNORMAL HIGH (ref 0.8–1.2)
Prothrombin Time: 20 s — ABNORMAL HIGH (ref 11.4–15.2)

## 2023-04-19 LAB — STREP PNEUMONIAE URINARY ANTIGEN: Strep Pneumo Urinary Antigen: NEGATIVE

## 2023-04-19 LAB — PROCALCITONIN: Procalcitonin: <0.1 ng/mL

## 2023-04-19 LAB — APTT: aPTT: 46 s — ABNORMAL HIGH (ref 24–36)

## 2023-04-19 LAB — HIV ANTIBODY (ROUTINE TESTING W REFLEX): HIV Screen 4th Generation wRfx: NONREACTIVE

## 2023-04-19 SURGERY — CYSTOSCOPY, FLEXIBLE, WITH STENT REPLACEMENT
Anesthesia: Choice | Laterality: Right

## 2023-04-19 MED ORDER — ORAL CARE MOUTH RINSE: 15.0000 mL | Freq: Once | OROMUCOSAL | Status: DC

## 2023-04-19 MED ORDER — ARFORMOTEROL TARTRATE 15 MCG/2ML IN NEBU
15.0000 ug | INHALATION_SOLUTION | Freq: Two times a day (BID) | RESPIRATORY_TRACT | Status: DC
  Administered 2023-04-19 – 2023-05-05 (×32): 15 ug via RESPIRATORY_TRACT
  Filled 2023-04-19 (×33): qty 2

## 2023-04-19 MED ORDER — ORAL CARE MOUTH RINSE: 15.0000 mL | OROMUCOSAL | Status: DC | PRN

## 2023-04-19 MED ORDER — BUDESONIDE 0.25 MG/2ML IN SUSP
0.2500 mg | Freq: Two times a day (BID) | RESPIRATORY_TRACT | Status: DC
  Administered 2023-04-19 – 2023-04-22 (×7): 0.25 mg via RESPIRATORY_TRACT
  Filled 2023-04-19 (×7): qty 2

## 2023-04-19 MED ORDER — CHLORHEXIDINE GLUCONATE 0.12 % MT SOLN
15.0000 mL | Freq: Once | OROMUCOSAL | Status: DC
Start: 2023-04-19 — End: 2023-04-20

## 2023-04-19 MED ORDER — MORPHINE SULFATE (PF) 2 MG/ML IV SOLN
2.0000 mg | INTRAVENOUS | Status: DC | PRN
  Administered 2023-04-19 – 2023-04-26 (×22): 2 mg via INTRAVENOUS
  Filled 2023-04-19 (×24): qty 1

## 2023-04-19 MED ORDER — HYDROCODONE-ACETAMINOPHEN 5-325 MG PO TABS
1.0000 | ORAL_TABLET | Freq: Two times a day (BID) | ORAL | Status: DC | PRN
  Administered 2023-04-19 – 2023-05-05 (×12): 1 via ORAL
  Filled 2023-04-19 (×12): qty 1

## 2023-04-19 MED ORDER — LACTATED RINGERS IV SOLN: INTRAVENOUS | Status: DC

## 2023-04-19 MED ORDER — IPRATROPIUM-ALBUTEROL 0.5-2.5 (3) MG/3ML IN SOLN
3.0000 mL | Freq: Four times a day (QID) | RESPIRATORY_TRACT | Status: DC
  Administered 2023-04-19 – 2023-04-20 (×5): 3 mL via RESPIRATORY_TRACT
  Filled 2023-04-19 (×6): qty 3

## 2023-04-19 NOTE — Hospital Course (Addendum)
 Brief Narrative:   68 year old with history of stage III adenocarcinoma of lung, PE on Xarelto, radiation-induced IPF, COPD, chronic hypoxia on 4-6 L nasal cannula, pulmonary MAI, bronchiectasis initially presented to the hospital on 3/16 for dyspnea, cough and tachycardia.  CTA of the chest was negative for PE but showed bilateral groundglass opacities and admitted to [facility] service.  Initial treatment was initiated for COPD exacerbation along with CAP on steroids, bronchodilators and Rocephin/azithromycin.  COVID, flu and RSV were negative.  Eventually cultures on 3/20 resulted as Pseudomonas therefore pulmonary was consulted and antibiotics were changed to cefepime.  Patient was also transitioned off IV steroids to p.o. prednisone.  Echocardiogram showed preserved EF.   Overall patient is doing well, transition to p.o. 2-week prednisone taper.  From respiratory standpoint patient is slowly improving but due to acute comorbidities and physical deconditioning, PT recommended CIR.  Inpatient rehab team consulted.  Assessment & Plan:  Principal Problem:   Acute on chronic hypoxic respiratory failure (HCC) Active Problems:   COPD (chronic obstructive pulmonary disease) (HCC)   Adenocarcinoma of right lung, stage 3 (HCC)   Chronic pulmonary embolism   BPH (benign prostatic hyperplasia)   History of lung cancer   Hyperlipidemia   Class I obesity   Acute respiratory distress, improved Acute on chronic hypoxia, improved -This is multifactorial in nature in the setting of Pseudomonas pneumonia and COPD exacerbation with possibly underlying worsening of radiation-induced fibrosis.  Pseudomonas pneumonia, stable - Initially treated with Rocephin and azithromycin and eventually transition to 7 days of cefepime which patient has completed.  Acute COPD exacerbation, stable - Acute on chronic hypoxia - At baseline uses 4 to 6 L of nasal cannula.  Initially started on IV Solu-Medrol now has been  transitioned to prednisone.  Echocardiogram shows preserved EF and has responded well to as needed diuretics.  Prednisone 40 mg, 2-week taper ordered  History of recurrent non-small cell lung cancer - Previously received chemotherapy and immunotherapy back in 2020 and 2022.  Followed by outpatient pulmonary team.  History of radiation induced IPF - Already on bronchodilators and steroids  History of pulmonary MAI Chronic pulmonary embolism - Patient currently is on Xarelto.  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  For now continue Flomax  Hold further lab draws unless needed.   DVT prophylaxis: Xarelto    Code Status: Full Code Family Communication: Sessile updated spouse Status is: Inpatient Remains inpatient appropriate because: Pending CIR placement   Subjective: Seen at bedside no complaints.  Examination:  General exam: Appears calm and comfortable; 7L [place]  Respiratory system: Clear to auscultation. Respiratory effort normal. Cardiovascular system: S1 & S2 heard, RRR. No JVD, murmurs, rubs, gallops or clicks. No pedal edema. Gastrointestinal system: Abdomen is nondistended, soft and nontender. No organomegaly or masses felt. Normal bowel sounds heard. Central nervous system: Alert and oriented. No focal neurological deficits. Extremities: Symmetric 5 x 5 power. Skin: No rashes, lesions or ulcers Psychiatry: Judgement and insight appear normal. Mood & affect appropriate.

## 2023-04-19 NOTE — Progress Notes (Signed)
 Patient is scheduled for surgery today with Dr. Cardell Peach for a cysto with right stent replacement and retrograde pyelogram.  I spoke with Anesthesia Dr Hyacinth Meeker and based on the notes from the ed physician anesthesia does not want to proceed with patients case today.  Paged Dr. Cardell Peach to make him aware. Waiting for call back.

## 2023-04-19 NOTE — Progress Notes (Addendum)
 Progress Note    Philip [name]   GNF:[id number]  DOB: September 11, 1955  DOA: 04/18/2023     0 PCP: Philip Has, MD  Initial CC: SOB  Hospital Course: Philip [name] is a 68 yo male with PMH stage III lung adenocarcinoma, radiation induced pulmonary fibrosis, chronic PE on Xarelto, chronic hypoxic respiratory failure on ~6L baseline, GERD, HLD, migraines who presented with SOB.  He was felt to have COPD exacerbation and admitted for ongoing treatment.  CT angio chest was negative for PE and showed underlying known fibrosis and groundglass opacities noted to be increased compared to prior CT.  Interval History:  Still SOB and having increased WOB in the ER this morning.   Assessment and Plan:  Acute exacerbation of COPD Acute on chronic hypoxic respiratory failure History of radiation induced lung fibrosis Community-acquired pneumonia History of adenocarcinoma of the lung stage III/non-small cell lung cancer -Patient presenting with worsening shortness of breath nonproductive cough.  Denies any fever and chill -Follows [place] pulmonology and on his oncology Dr. Arbutus Ped. -Patient is afebrile.  CBC no evidence of leukocytosis. - Negative for COVID, flu, RSV.  Follow-up RVP - Chest x-ray showed stable cardiomegaly with emphysematous change, scarring and atelectasis.  Stable small to moderate right-sided pleural effusion - CTA chest no evidence of pulmonary embolism.  Posttreatment/radiation change with dense right> left pulmonary fibrosis.  Groundglass opacities of the both lung increased compared to 03/25/2023.  It is difficult to exclude any infectious process -Given patient [name] immunocompromised status with history of lung cancer, COPD and pulmonary fibrosis continue to treat with IV antibiotic for presumptive pneumonia.   - continue IV ceftriaxone and continue IV azithromycin - continue pulmicort, brovana  - continue IV steroids     Noncardiac right-sided chest pain - Patient  complaining about right-sided chest wall pain -History of right-sided lung adenocarcinoma.  Troponin x 2 unremarkable.  EKG there is no evidence of ST anterior abnormality.  Noncardiac chest pain in the setting of chronic lung cancer and COPD exacerbation   History of pulmonary embolism - continue xarelto    History of chronic right-sided ureteral stent -UA negative - Was to undergo stent removal with urology today; procedure [name] been postponed  Hyperlipidemia -Continue Lipitor   BPH -Continue Flomax   Old records reviewed in assessment of this patient  Antimicrobials: Rocephin 04/18/2023 >> current Azithromycin 04/18/2023 >> current  DVT prophylaxis:  SCDs Start: 04/18/23 2211 Place TED hose Start: 04/18/23 2211 rivaroxaban (XARELTO) tablet 20 mg   Code Status:   Code Status: Full Code  Mobility Assessment (Last 72 Hours)     Mobility Assessment   No documentation.           Barriers to discharge: None Disposition Plan: Home HH orders placed: N/A Status is: Inpatient  Objective: Blood pressure 118/83, pulse (!) 115, temperature 98.4 F (36.9 C), resp. rate (!) 23, SpO2 93%.  Examination:  Physical Exam Constitutional:      General: He is not in acute distress.    Appearance: Normal appearance.  HENT:     Head: Normocephalic and atraumatic.     Mouth/Throat:     Mouth: Mucous membranes are moist.  Eyes:     Extraocular Movements: Extraocular movements intact.  Cardiovascular:     Rate and Rhythm: Normal rate and regular rhythm.  Pulmonary:     Effort: Pulmonary effort is normal. No respiratory distress.     Breath sounds: Decreased air movement present. Wheezing and rhonchi present.  Abdominal:  General: Bowel sounds are normal. There is no distension.     Palpations: Abdomen is soft.     Tenderness: There is no abdominal tenderness.  Musculoskeletal:        General: Normal range of motion.     Cervical back: Normal range of motion and neck  supple.  Skin:    General: Skin is warm and dry.  Neurological:     General: No focal deficit present.     Mental Status: He is alert.  Psychiatric:        Mood and Affect: Mood normal.        Behavior: Behavior normal.      Consultants:    Procedures:    Data Reviewed: Results for orders placed or performed during the hospital encounter of 04/18/23 (from the past 24 hours)  CBC with Differential     Status: Abnormal   Collection Time: 04/18/23  7:05 PM  Result Value Ref Range   WBC 8.5 4.0 - 10.5 K/uL   RBC 4.65 4.22 - 5.81 MIL/uL   Hemoglobin 13.3 13.0 - 17.0 g/dL   HCT 14.7 82.9 - 56.2 %   MCV 95.3 80.0 - 100.0 fL   MCH 28.6 26.0 - 34.0 pg   MCHC 30.0 30.0 - 36.0 g/dL   RDW 13.0 86.5 - 78.4 %   Platelets 230 150 - 400 K/uL   nRBC 0.0 0.0 - 0.2 %   Neutrophils Relative % 84 %   Neutro Abs 7.1 1.7 - 7.7 K/uL   Lymphocytes Relative 6 %   Lymphs Abs 0.5 (L) 0.7 - 4.0 K/uL   Monocytes Relative 8 %   Monocytes Absolute 0.7 0.1 - 1.0 K/uL   Eosinophils Relative 1 %   Eosinophils Absolute 0.1 0.0 - 0.5 K/uL   Basophils Relative 0 %   Basophils Absolute 0.0 0.0 - 0.1 K/uL   Immature Granulocytes 1 %   Abs Immature Granulocytes 0.05 0.00 - 0.07 K/uL  Comprehensive metabolic panel     Status: Abnormal   Collection Time: 04/18/23  7:05 PM  Result Value Ref Range   Sodium 139 135 - 145 mmol/L   Potassium 4.5 3.5 - 5.1 mmol/L   Chloride 97 (L) 98 - 111 mmol/L   CO2 33 (H) 22 - 32 mmol/L   Glucose, Bld 106 (H) 70 - 99 mg/dL   BUN 19 8 - 23 mg/dL   Creatinine, Ser 6.96 0.61 - 1.24 mg/dL   Calcium 9.2 8.9 - 29.5 mg/dL   Total Protein 7.3 6.5 - 8.1 g/dL   Albumin 3.4 (L) 3.5 - 5.0 g/dL   AST 22 15 - 41 U/L   ALT 16 0 - 44 U/L   Alkaline Phosphatase 77 38 - 126 U/L   Total Bilirubin 0.5 0.0 - 1.2 mg/dL   GFR, Estimated >28 >41 mL/min   Anion gap 9 5 - 15  Troponin I (High Sensitivity)     Status: None   Collection Time: 04/18/23  7:05 PM  Result Value Ref Range    Troponin I (High Sensitivity) 13 <18 ng/L  Brain natriuretic peptide     Status: None   Collection Time: 04/18/23  7:05 PM  Result Value Ref Range   B Natriuretic Peptide 86.5 0.0 - 100.0 pg/mL  Resp panel by RT-PCR (RSV, Flu A&B, Covid) Anterior Nasal Swab     Status: None   Collection Time: 04/18/23  7:17 PM   Specimen: Anterior Nasal Swab  Result Value Ref  Range   SARS Coronavirus 2 by RT PCR NEGATIVE NEGATIVE   Influenza A by PCR NEGATIVE NEGATIVE   Influenza B by PCR NEGATIVE NEGATIVE   Resp Syncytial Virus by PCR NEGATIVE NEGATIVE  Troponin I (High Sensitivity)     Status: None   Collection Time: 04/18/23  9:15 PM  Result Value Ref Range   Troponin I (High Sensitivity) 14 <18 ng/L  Comprehensive metabolic panel     Status: Abnormal   Collection Time: 04/19/23  4:00 AM  Result Value Ref Range   Sodium 140 135 - 145 mmol/L   Potassium 4.0 3.5 - 5.1 mmol/L   Chloride 101 98 - 111 mmol/L   CO2 31 22 - 32 mmol/L   Glucose, Bld 154 (H) 70 - 99 mg/dL   BUN 15 8 - 23 mg/dL   Creatinine, Ser 1.61 0.61 - 1.24 mg/dL   Calcium 8.7 (L) 8.9 - 10.3 mg/dL   Total Protein 6.6 6.5 - 8.1 g/dL   Albumin 3.0 (L) 3.5 - 5.0 g/dL   AST 17 15 - 41 U/L   ALT 14 0 - 44 U/L   Alkaline Phosphatase 70 38 - 126 U/L   Total Bilirubin 0.4 0.0 - 1.2 mg/dL   GFR, Estimated >09 >60 mL/min   Anion gap 8 5 - 15  CBC     Status: Abnormal   Collection Time: 04/19/23  4:00 AM  Result Value Ref Range   WBC 7.8 4.0 - 10.5 K/uL   RBC 4.31 4.22 - 5.81 MIL/uL   Hemoglobin 12.6 (L) 13.0 - 17.0 g/dL   HCT 45.4 09.8 - 11.9 %   MCV 97.0 80.0 - 100.0 fL   MCH 29.2 26.0 - 34.0 pg   MCHC 30.1 30.0 - 36.0 g/dL   RDW 14.7 82.9 - 56.2 %   Platelets 219 150 - 400 K/uL   nRBC 0.0 0.0 - 0.2 %  Protime-INR     Status: Abnormal   Collection Time: 04/19/23  4:00 AM  Result Value Ref Range   Prothrombin Time 20.0 (H) 11.4 - 15.2 seconds   INR 1.7 (H) 0.8 - 1.2  APTT     Status: Abnormal   Collection Time: 04/19/23   4:00 AM  Result Value Ref Range   aPTT 46 (H) 24 - 36 seconds  Procalcitonin     Status: None   Collection Time: 04/19/23  4:00 AM  Result Value Ref Range   Procalcitonin <0.10 ng/mL  Urinalysis, Routine w reflex microscopic -Urine, Clean Catch     Status: Abnormal   Collection Time: 04/19/23  4:04 AM  Result Value Ref Range   Color, Urine YELLOW YELLOW   APPearance CLOUDY (A) CLEAR   Specific Gravity, Urine 1.028 1.005 - 1.030   pH 6.0 5.0 - 8.0   Glucose, UA NEGATIVE NEGATIVE mg/dL   Hgb urine dipstick LARGE (A) NEGATIVE   Bilirubin Urine NEGATIVE NEGATIVE   Ketones, ur NEGATIVE NEGATIVE mg/dL   Protein, ur 30 (A) NEGATIVE mg/dL   Nitrite NEGATIVE NEGATIVE   Leukocytes,Ua NEGATIVE NEGATIVE   RBC / HPF >50 0 - 5 RBC/hpf   WBC, UA 0-5 0 - 5 WBC/hpf   Bacteria, UA NONE SEEN NONE SEEN   Squamous Epithelial / HPF 0-5 0 - 5 /HPF  Strep pneumoniae urinary antigen     Status: None   Collection Time: 04/19/23  4:05 AM  Result Value Ref Range   Strep Pneumo Urinary Antigen NEGATIVE NEGATIVE  HIV Antibody (  routine testing w rflx)     Status: None   Collection Time: 04/19/23  4:06 AM  Result Value Ref Range   HIV Screen 4th Generation wRfx Non Reactive Non Reactive    I have reviewed pertinent nursing notes, vitals, labs, and images as necessary. I have ordered labwork to follow up on as indicated.  I have reviewed the last notes from staff over past 24 hours. I have discussed patient's care plan and test results with nursing staff, CM/SW, and other staff as appropriate.  Time spent: Greater than 50% of the 55 minute visit was spent in counseling/coordination of care for the patient as laid out in the A&P.   LOS: 0 days   Lewie Chamber, MD [facility] 04/19/2023, 1:26 PM

## 2023-04-19 NOTE — ED Notes (Signed)
 Patient states he takes HYDROcodone-acetaminophen (NORCO/VICODIN) 5-325 MG tablet at home and is asking for it here. States he has 6/10 chronic and back pain and pain in his penis from stent that was supposed to be removed today. MD aware

## 2023-04-20 ENCOUNTER — Other Ambulatory Visit: Payer: Self-pay

## 2023-04-20 DIAGNOSIS — C3491 Malignant neoplasm of unspecified part of right bronchus or lung: Secondary | ICD-10-CM | POA: Diagnosis not present

## 2023-04-20 DIAGNOSIS — J9621 Acute and chronic respiratory failure with hypoxia: Secondary | ICD-10-CM | POA: Diagnosis not present

## 2023-04-20 DIAGNOSIS — J441 Chronic obstructive pulmonary disease with (acute) exacerbation: Secondary | ICD-10-CM | POA: Diagnosis not present

## 2023-04-20 LAB — EXPECTORATED SPUTUM ASSESSMENT W GRAM STAIN, RFLX TO RESP C

## 2023-04-20 MED ORDER — METOPROLOL TARTRATE 5 MG/5ML IV SOLN
5.0000 mg | INTRAVENOUS | Status: DC | PRN
  Administered 2023-04-20 – 2023-04-21 (×2): 5 mg via INTRAVENOUS
  Filled 2023-04-20 (×3): qty 5

## 2023-04-20 MED ORDER — IPRATROPIUM-ALBUTEROL 0.5-2.5 (3) MG/3ML IN SOLN
3.0000 mL | Freq: Four times a day (QID) | RESPIRATORY_TRACT | Status: DC
  Administered 2023-04-20 – 2023-05-05 (×57): 3 mL via RESPIRATORY_TRACT
  Filled 2023-04-20 (×56): qty 3

## 2023-04-20 MED ORDER — AZITHROMYCIN 250 MG PO TABS
500.0000 mg | ORAL_TABLET | Freq: Every day | ORAL | Status: DC
  Administered 2023-04-20 – 2023-04-21 (×2): 500 mg via ORAL
  Filled 2023-04-20 (×2): qty 2

## 2023-04-20 NOTE — Progress Notes (Signed)
 Progress Note    Philip [name]   XBJ:[id number]  DOB: November 29, 1955  DOA: 04/18/2023     1 PCP: Farris Has, MD  Initial CC: SOB  Hospital Course: Philip [name] is a 68 yo male with PMH stage III lung adenocarcinoma, radiation induced pulmonary fibrosis, chronic PE on Xarelto, chronic hypoxic respiratory failure on ~6L baseline, GERD, HLD, migraines who presented with SOB.  He was felt to have COPD exacerbation and admitted for ongoing treatment.  CT angio chest was negative for PE and showed underlying known fibrosis and groundglass opacities noted to be increased compared to prior CT.  Interval History:  Much more comfortable today.  Morphine helped considerably with work of breathing. Very low reserve with any movement or exertion which is his baseline.  Assessment and Plan:  Acute exacerbation of COPD Acute on chronic hypoxic respiratory failure History of radiation induced lung fibrosis Community-acquired pneumonia History of adenocarcinoma of the lung stage III/non-small cell lung cancer -Patient presenting with worsening shortness of breath nonproductive cough.  Denies any fever and chill -Follows [place] pulmonology and on his oncology Dr. Arbutus Ped. -Patient is afebrile.  CBC no evidence of leukocytosis. - Negative for COVID, flu, RSV.  Follow-up RVP - Chest x-ray showed stable cardiomegaly with emphysematous change, scarring and atelectasis.  Stable small to moderate right-sided pleural effusion - CTA chest no evidence of pulmonary embolism.  Posttreatment/radiation change with dense right> left pulmonary fibrosis.  Groundglass opacities of the both lung increased compared to 03/25/2023.  It is difficult to exclude any infectious process -Given patient has immunocompromised status with history of lung cancer, COPD and pulmonary fibrosis continue to treat with IV antibiotic for presumptive pneumonia.   - continue IV ceftriaxone and continue IV azithromycin - continue pulmicort,  brovana  - continue IV steroids     Noncardiac right-sided chest pain - Patient complaining about right-sided chest wall pain -History of right-sided lung adenocarcinoma.  Troponin x 2 unremarkable.  EKG there is no evidence of ST anterior abnormality.  Noncardiac chest pain in the setting of chronic lung cancer and COPD exacerbation   History of pulmonary embolism - continue xarelto    History of chronic right-sided ureteral stent -UA negative - Was to undergo stent removal with urology on admission; procedure has been postponed  Hyperlipidemia -Continue Lipitor   BPH -Continue Flomax   Old records reviewed in assessment of this patient  Antimicrobials: Rocephin 04/18/2023 >> current Azithromycin 04/18/2023 >> current  DVT prophylaxis:  SCDs Start: 04/18/23 2211 Place TED hose Start: 04/18/23 2211 rivaroxaban (XARELTO) tablet 20 mg   Code Status:   Code Status: Full Code  Mobility Assessment (Last 72 Hours)     Mobility Assessment     Row Name 04/19/23 2315 04/19/23 1744         Does patient have an order for bedrest or is patient medically unstable No - Continue assessment No - Continue assessment      What is the highest level of mobility based on the progressive mobility assessment? Level 4 (Walks with assist in room) - Balance while marching in place and cannot step forward and back - Complete Level 5 (Walks with assist in room/hall) - Balance while stepping forward/back and can walk in room with assist - Complete               Barriers to discharge: None Disposition Plan: Home HH orders placed: N/A Status is: Inpatient  Objective: Blood pressure 129/69, pulse (!) 116, temperature 98.3 F (  36.8 C), temperature source Oral, resp. rate (!) 22, height 5\' 7"  (1.702 m), weight 90.7 kg, SpO2 96%.  Examination:  Physical Exam Constitutional:      General: He is not in acute distress.    Appearance: Normal appearance.  HENT:     Head: Normocephalic and  atraumatic.     Mouth/Throat:     Mouth: Mucous membranes are moist.  Eyes:     Extraocular Movements: Extraocular movements intact.  Cardiovascular:     Rate and Rhythm: Normal rate and regular rhythm.  Pulmonary:     Effort: Pulmonary effort is normal. No respiratory distress.     Breath sounds: Decreased air movement present. Wheezing and rhonchi present.  Abdominal:     General: Bowel sounds are normal. There is no distension.     Palpations: Abdomen is soft.     Tenderness: There is no abdominal tenderness.  Musculoskeletal:        General: Normal range of motion.     Cervical back: Normal range of motion and neck supple.  Skin:    General: Skin is warm and dry.  Neurological:     General: No focal deficit present.     Mental Status: He is alert.  Psychiatric:        Mood and Affect: Mood normal.        Behavior: Behavior normal.      Consultants:    Procedures:    Data Reviewed: No results found for this or any previous visit (from the past 24 hours).   I have reviewed pertinent nursing notes, vitals, labs, and images as necessary. I have ordered labwork to follow up on as indicated.  I have reviewed the last notes from staff over past 24 hours. I have discussed patient's care plan and test results with nursing staff, CM/SW, and other staff as appropriate.  Time spent: Greater than 50% of the 55 minute visit was spent in counseling/coordination of care for the patient as laid out in the A&P.   LOS: 1 day   Lewie Chamber, MD [facility] 04/20/2023, 1:39 PM

## 2023-04-21 DIAGNOSIS — J441 Chronic obstructive pulmonary disease with (acute) exacerbation: Secondary | ICD-10-CM | POA: Diagnosis not present

## 2023-04-21 NOTE — Progress Notes (Signed)
  Progress Note   Patient: EBERT FORRESTER ONG:[id number] DOB: 06/25/1955 DOA: 04/18/2023     2 DOS: the patient was seen and examined on 04/21/2023 at 10:26AM      Brief hospital course: 68 y.o. M with NSCLC Stage IIIc, advanced COPD, radiation pneumonitis, hx pneumothorax, bronchiectasis, MAC, and chronic PE on Xarelto and chronic respiratory failure on 6-10L at home who presented with dyspnea.        Assessment and Plan: Acute on chronic respiratory failure with hypoxia Multifactorial due to COPD with exacerbation, superimposed on underlying radiation-induced lung fibrosis, acute on chronic pulmonary embolism, community-acquired pneumonia, lung cancer, and bronchiectasis/MAC  Admitted and CT showed worsening interstitial opacities at baseline.  Ruled out new acute PE.  COVID, flu, RSV negative.  He was started on steroids for COPD exacerbation and antibiotics for infection.  He has had slow and gradual improvement, some of this improvement may be from starting morphine - Continue steroids - Continue antibiotics - Continue Pulmicort and Brovana - Consult oncology, appreciate recommendations  Chronic pulmonary embolism - Continue Xarelto  History of right-sided ureteral stent - Outpatient urology follow-up for stent removal  BPH - Continue Flomax  Hyperlipidemia - Continue Lipitor  Class I obesity BMI 31.3  Non-small cell lung cancer Initially diagnosed December 2020, underwent carboplatin paclitaxel with radiation.  Then had radiographic evidence of disease progression in 2022 (no repeat biopsies), underwent carboplatin, Alimta, Keytruda.  Carboplatin stopped due to anaphylaxis.  Keytruda stopped due to concern for pneumonitis.  No radiographic evidence of progression since then.     Subjective: No significant change.  No new nursing concerns.  Oncology evaluated the patient today.     Physical Exam: BP 131/84   Pulse (!) 110   Temp 97.8 F (36.6 C) (Oral)    Resp 20   Ht 5\' 7"  (1.702 m)   Wt 90.7 kg   SpO2 94%   BMI 31.32 kg/m   Adult male, lying in bed, no acute distress Appears out of breath at rest, respirations increased lung sounds with crackles bilaterally, overall diminished, no wheezing Tachycardic, regular, no murmurs, no peripheral edema Abdomen soft no tenderness palpation or guarding, no ascites or distention Attention normal, affect normal, judgment and insight appear normal   Data Reviewed: Discussed case with pulmonology Comprehensive metabolic panel, procalcitonin, and CBC normal Sputum culture pending Troponin and BNP normal  Family Communication: None present    Disposition: Status is: Inpatient         Author: Alberteen Sam, MD 04/21/2023 3:40 PM  For on call review [email or web address].

## 2023-04-21 NOTE — TOC CM/SW Note (Signed)
 Transition of Care [facility]) - Inpatient Brief Assessment   Patient Details  Name: Philip [name] MRN: [id number] Date of Birth: 02/22/1955  [facility]) CM/SW Contact:    Larrie Kass, LCSW Phone Number: 04/21/2023, 3:23 PM   Clinical Narrative: No PCP or SDOH concerns.    Transition of Care Asessment: Insurance and Status: Insurance coverage has been reviewed Patient has primary care physician: Yes Home environment has been reviewed: home wiht spouse Prior level of function:: mod-independent Prior/Current Home Services: No current home services Social Drivers of Health Review: SDOH reviewed no interventions necessary Readmission risk has been reviewed: Yes Transition of care needs: no transition of care needs at this time

## 2023-04-21 NOTE — Progress Notes (Signed)
 DIAGNOSIS:  1) recurrent non-small cell lung cancer initially diagnosed as stage IIIc (T4, N3, M0) non-small cell lung cancer, adenocarcinoma presented with large right hilar mass in addition to bilateral hilar and mediastinal lymphadenopathy diagnosed in December 2020.  The patient has evidence for disease recurrence in October 2022. 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  Started on Xarelto on March 17, 2020 and currently 20 mg p.o. daily.   PRIOR THERAPY: 1) Weekly concurrent chemoradiation with carboplatin for an AUC of 2 and paclitaxel 45 mg/m2. First dose starting 01/30/2019.   Status post 6 cycles. 2) Consolidation immunotherapy with Imfinzi 1500 mg IV every 4 weeks. First dose expected on 04/19/2019.  Status post 13 cycles. 3) disease recurrence in October 2022. 4) Systemic chemotherapy with carboplatin for AUC of 5, Alimta 500 Mg/M2 and Keytruda 200 Mg IV every 3 weeks.  First dose January 01, 2021.  Status post 21 cycles.  He had hypersensitivity reaction to carboplatin and this was discontinued after cycle #2.  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 Treatment has been on hold since 04/02/22 due to other health conditions.   CURRENT THERAPY: Observation  Subjective: The patient is seen and examined today.  He continues to have the baseline shortness of breath and currently on oxygen.  He is feeling a little bit better since his admission.  He is a very pleasant 68 years old white male with recurrent non-small cell lung cancer that was initially diagnosed as stage IIIc in December 2020 with metastatic disease and recurrence in October 2022.  He also has a history of acute on chronic pulmonary embolism and been on treatment with anticoagulation.  The patient status post several treatment in the past including a course of  concurrent chemoradiation followed by consolidation immunotherapy followed by systemic chemotherapy again when he had recurrence but has been on observation since February 2024.  His last imaging studies less than a month ago showed no concerning findings for disease progression.  He was admitted with shortness of breath and high requirement for oxygen at home.  He was also complaining of fatigue and dysuria.  Repeat CT angiogram of the chest on April 18, 2022 showed no evidence for pulmonary embolism and no clear evidence for disease progression.  Objective: Vital signs in last 24 hours: Temp:  [97.6 F (36.4 C)-98.6 F (37 C)] 97.8 F (36.6 C) (03/19 1302) Pulse Rate:  [102-116] 110 (03/19 1302) Resp:  [20-22] 20 (03/19 1302) BP: (110-131)/(68-84) 131/84 (03/19 1302) SpO2:  [90 %-100 %] 94 % (03/19 1302)  Intake/Output from previous day: 03/18 0701 - 03/19 0700 In: 680 [P.O.:680] Out: 750 [Urine:750] Intake/Output this shift: Total I/O In: -  Out: 450 [Urine:450]  General appearance: alert, cooperative, fatigued, and mild distress Resp: rales bilaterally Cardio: regular rate and rhythm, S1, S2 normal, no murmur, click, rub or gallop GI: soft, non-tender; bowel sounds normal; no masses,  no organomegaly Extremities: edema 1+ edema  Lab Results:  Recent Labs    04/18/23 1905 04/19/23 0400  WBC 8.5 7.8  HGB 13.3 12.6*  HCT 44.3 41.8  PLT 230 219   BMET Recent Labs    04/18/23 1905 04/19/23 0400  NA 139 140  K 4.5 4.0  CL 97* 101  CO2 33* 31  GLUCOSE 106* 154*  BUN 19 15  CREATININE 0.94 0.88  CALCIUM 9.2  8.7*    Studies/Results: No results found.  Medications: I have reviewed the patient's current medications.   Assessment/Plan: This is a very pleasant 68 years old white male with metastatic non-small cell lung cancer, adenocarcinoma that was initially diagnosed in December 2020 as a stage III status post a course of concurrent chemoradiation followed by  consolidation immunotherapy with Imfinzi.  He had disease progression in October 2022 when he was treated with systemic chemotherapy with carboplatin, Alimta and Keytruda followed by maintenance Alimta for 21 cycles.  He has been off treatment since February 2024 and most recent imaging studies showed no clear evidence for disease progression. The patient was admitted with COPD exacerbation and questionable underlying pneumonia. Repeat CT angiogram of the chest showed no concerning findings for disease progression and no evidence for pulmonary embolism. For the COPD exacerbation he is currently on high-dose steroid and I agree with this plan. For the questionable pneumonia is currently on treatment with Rocephin and azithromycin. I agree with the current plan and there is no additional recommendation from the oncology standpoint at this time. The patient already has a scheduled follow-up visit with me in few months. Thank you for taking good care of Mr. Reason.  Please call if you have any questions. Disclaimer: This note was dictated with voice recognition software. Similar sounding words can inadvertently be transcribed and may be missed upon review. Lajuana Matte, MD    LOS: 2 days    Lajuana Matte 04/21/2023

## 2023-04-22 DIAGNOSIS — J8410 Pulmonary fibrosis, unspecified: Secondary | ICD-10-CM | POA: Diagnosis not present

## 2023-04-22 DIAGNOSIS — Z85118 Personal history of other malignant neoplasm of bronchus and lung: Secondary | ICD-10-CM | POA: Diagnosis not present

## 2023-04-22 DIAGNOSIS — J151 Pneumonia due to Pseudomonas: Secondary | ICD-10-CM

## 2023-04-22 DIAGNOSIS — E669 Obesity, unspecified: Secondary | ICD-10-CM | POA: Insufficient documentation

## 2023-04-22 DIAGNOSIS — J449 Chronic obstructive pulmonary disease, unspecified: Secondary | ICD-10-CM | POA: Diagnosis not present

## 2023-04-22 DIAGNOSIS — B965 Pseudomonas (aeruginosa) (mallei) (pseudomallei) as the cause of diseases classified elsewhere: Secondary | ICD-10-CM

## 2023-04-22 DIAGNOSIS — J441 Chronic obstructive pulmonary disease with (acute) exacerbation: Secondary | ICD-10-CM | POA: Diagnosis not present

## 2023-04-22 DIAGNOSIS — Z7189 Other specified counseling: Secondary | ICD-10-CM

## 2023-04-22 DIAGNOSIS — J9621 Acute and chronic respiratory failure with hypoxia: Secondary | ICD-10-CM | POA: Diagnosis not present

## 2023-04-22 LAB — CBC
HCT: 38.1 % — ABNORMAL LOW (ref 39.0–52.0)
Hemoglobin: 11.3 g/dL — ABNORMAL LOW (ref 13.0–17.0)
MCH: 29.2 pg (ref 26.0–34.0)
MCHC: 29.7 g/dL — ABNORMAL LOW (ref 30.0–36.0)
MCV: 98.4 fL (ref 80.0–100.0)
Platelets: 228 10*3/uL (ref 150–400)
RBC: 3.87 MIL/uL — ABNORMAL LOW (ref 4.22–5.81)
RDW: 15 % (ref 11.5–15.5)
WBC: 13.1 10*3/uL — ABNORMAL HIGH (ref 4.0–10.5)
nRBC: 0 % (ref 0.0–0.2)

## 2023-04-22 LAB — BASIC METABOLIC PANEL
Anion gap: 9 (ref 5–15)
BUN: 27 mg/dL — ABNORMAL HIGH (ref 8–23)
CO2: 28 mmol/L (ref 22–32)
Calcium: 8.6 mg/dL — ABNORMAL LOW (ref 8.9–10.3)
Chloride: 100 mmol/L (ref 98–111)
Creatinine, Ser: 0.97 mg/dL (ref 0.61–1.24)
GFR, Estimated: >60 mL/min (ref >60)
Glucose, Bld: 152 mg/dL — ABNORMAL HIGH (ref 70–99)
Potassium: 4.6 mmol/L (ref 3.5–5.1)
Sodium: 137 mmol/L (ref 135–145)

## 2023-04-22 MED ORDER — MORPHINE SULFATE 10 MG/5ML PO SOLN
2.5000 mg | ORAL | Status: DC | PRN
  Administered 2023-04-26 (×2): 2.5 mg via ORAL
  Filled 2023-04-22 (×2): qty 5

## 2023-04-22 MED ORDER — SODIUM CHLORIDE 0.9 % IV SOLN
2.0000 g | Freq: Three times a day (TID) | INTRAVENOUS | Status: AC
  Administered 2023-04-22 – 2023-04-28 (×20): 2 g via INTRAVENOUS
  Filled 2023-04-22 (×20): qty 12.5

## 2023-04-22 MED ORDER — BUDESONIDE 0.5 MG/2ML IN SUSP
0.5000 mg | Freq: Two times a day (BID) | RESPIRATORY_TRACT | Status: DC
  Administered 2023-04-22 – 2023-05-05 (×26): 0.5 mg via RESPIRATORY_TRACT
  Filled 2023-04-22 (×26): qty 2

## 2023-04-22 MED ORDER — POLYETHYLENE GLYCOL 3350 17 G PO PACK
17.0000 g | PACK | Freq: Every day | ORAL | Status: DC
  Administered 2023-04-22 – 2023-04-27 (×5): 17 g via ORAL
  Filled 2023-04-22 (×5): qty 1

## 2023-04-22 NOTE — Assessment & Plan Note (Signed)
 History of right-sided ureteral stent - Continue Flomax - Outpatient urology follow up for stent removal

## 2023-04-22 NOTE — Assessment & Plan Note (Signed)
 -  Continue Lipitor

## 2023-04-22 NOTE — Assessment & Plan Note (Signed)
 Continue Xarelto

## 2023-04-22 NOTE — Assessment & Plan Note (Signed)
BMI 31.3 

## 2023-04-22 NOTE — Assessment & Plan Note (Signed)
 Initially diagnosed December 2020, underwent carboplatin paclitaxel with radiation.  Then had radiographic evidence of disease progression in 2022 (no repeat biopsies), underwent carboplatin, Alimta, Keytruda.  Carboplatin stopped due to anaphylaxis.  Keytruda stopped due to concern for pneumonitis.  No radiographic evidence of progression since then.

## 2023-04-22 NOTE — Consult Note (Addendum)
 Consultation Note Date: 04/22/2023   Patient Name: Philip [name]  DOB: [birth year]  MRN: [id number]  Age / Sex: 68 y.o., male  PCP: Farris Has, MD Referring Physician: Alberteen Sam, *  Reason for Consultation:  goals of care  HPI/Patient Profile: 68 y.o. male  with past medical history of lung ca s/p SBRT, chemotherapy and immunotherapy- currently on survellance without evidence of remission, COPD, pulmonary fibrosis, chronic respiratory failure on oxygen at home, admitted on 04/18/2023 with COPD exacerbation- acute on chronic respiratory failure due to pneumonia. Palliative consulted for goals of care.    Primary Decision Maker PATIENT - surrogate decision maker would be spouse if needed  Discussion: Chart reviewed including labs, progress notes, imaging from this and previous encounters. Reviewed CT angio- no PE, dense fibrosis, increased ground glass compared to 2/21- likely related to pnuemonia or pulmonary edema - no evidence of cancer progression since 2022.  Per attending note- being treated with steroids with improvement. Improvement also likely due to initiation of morphine.  Met at bedside with patient.  He was awake and alert and oriented.  Did not appear to be in any distress.  We discussed his current diagnosis and extensive discussion of his health history. He participated in a very extensive life review.  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  Shared some of his traumatic background.  He has been lucky to find and establish a family who cares for him.  He enjoys being outdoors and enjoys teaching working on Engineer, mining although he has not been able to do the more physically demanding things due to his decline in his lung function.  He does continue to enjoy spending time with his grandchildren and passing on the things that he is learned in  his lifetime. We discussed his goals of care.  He shares that currently his goals of care are to get the most out of his hospitalization and discharge home.  He is hopeful for some physical therapy in an effort to improve his functional status and ability to tolerate day-to-day life.  Currently he is mostly bed to chair standing indoors. He does note improvement with use of morphine.  We discussed using liquid morphine at home at discharge to increase his activity tolerance. Advanced Care Planning With his permission we discussed advance care planning.  He does have a living will and HCPOA.  This is not found on his chart and I have requested at copy. We discussed his CODE STATUS and feelings about artificial life support.  He notes he would not want to be kept alive long-term on life support if it was not going to improve his functional status.  He wishes to remain full code for now and would be okay with temporary artificial life support. I encouraged him to use his memories and the things he enjoys in life as markers of quality of life for him and to share these with his family as there may come a time where he  wishes to set limits on life prolonging interventions.  Encouraged him to continue these discussions with his family and medical providers. His hope is to discharge home with some therapies.  We discussed possible outpatient palliative follow-up at home and he is in agreement with this.  He has previously seen Philip Bandy, NP at the cancer center, but he would prefer to have at home palliative as travel is difficult for him at times.     SUMMARY OF RECOMMENDATIONS -Patient admitted with COPD exacerbation, possible pneumonia, in the setting of radiation-induced pulmonary fibrosis status post treatment for lung cancer -Goals of care spoke with hopes to discharge back to his home with outpatient physical therapy and outpatient palliative medicine -TOC order placed for referral to outpatient palliative  appreciate assistance -Recommend discharge with morphine liquid 2.5 mg p.o. every 4 hours as needed for shortness of breath and has prophylactic use before ADLs to increase activity tolerance -Will start liquid morphine while he is here to wean off of IV morphine -He is on MiraLAX daily  Code Status/Advance Care Planning:   Code Status: Full Code    Prognosis:   Unable to determine  Discharge Planning: Home with Palliative Services  Primary Diagnoses: Present on Admission:  Adenocarcinoma of right lung, stage 3 (HCC)  Acute on chronic hypoxic respiratory failure (HCC)  Acute exacerbation of chronic obstructive pulmonary disease (COPD) (HCC)  History of pulmonary embolism  BPH (benign prostatic hyperplasia)   Review of Systems  Physical Exam  Vital Signs: BP 113/79 (BP Location: Right Arm)   Pulse 99   Temp 97.9 F (36.6 C) (Oral)   Resp 20   Ht 5\' 7"  (1.702 m)   Wt 90.7 kg   SpO2 99%   BMI 31.32 kg/m  Pain Scale: 0-10   Pain Score: 0-No pain   SpO2: SpO2: 99 % O2 Device:SpO2: 99 % O2 Flow Rate: .O2 Flow Rate (L/min): 9 L/min  IO: Intake/output summary:  Intake/Output Summary (Last 24 hours) at 04/22/2023 1434 Last data filed at 04/22/2023 0856 Gross per 24 hour  Intake 480 ml  Output 1000 ml  Net -520 ml    LBM: Last BM Date : 04/18/23 Baseline Weight: Weight: 90.7 kg Most recent weight: Weight: 90.7 kg       Thank you for this consult. Palliative medicine will continue to follow and assist as needed.  Time Total: 90 minutes Signed by: Ocie Bob, AGNP-C Palliative Medicine  Time includes:   Preparing to see the patient (e.g., review of tests) Obtaining and/or reviewing separately obtained history Performing a medically necessary appropriate examination and/or evaluation Counseling and educating the patient/family/caregiver Ordering medications, tests, or procedures Referring and communicating with other health care professionals (when not  reported separately) Documenting clinical information in the electronic or other health record Independently interpreting results (not reported separately) and communicating results to the patient/family/caregiver Care coordination (not reported separately) Clinical documentation   Please contact Palliative Medicine Team phone at [phone] for questions and concerns.  For individual provider: See Loretha Stapler

## 2023-04-22 NOTE — Progress Notes (Signed)
  Progress Note   Patient: Philip [name] WNU:[id number] DOB: Jul 11, 1955 DOA: 04/18/2023     3 DOS: the patient was seen and examined on 04/22/2023 at 9:26 AM      Brief hospital course: 68 y.o. M with NSCLC Stage IIIc, advanced COPD, radiation pneumonitis, hx pneumothorax, bronchiectasis, MAC, and chronic PE on Xarelto and chronic respiratory failure on 6-10L at home who presented with dyspnea.        Assessment and Plan: Acute on chronic respiratory failure with hypoxia Multifactorial due to COPD with exacerbation, superimposed on underlying radiation-induced lung fibrosis, acute on chronic pulmonary embolism, community-acquired pneumonia, lung cancer, and bronchiectasis/MAC   Admitted and CT showed worsening interstitial opacities at baseline.  Ruled out new acute PE.  COVID, flu, RSV negative.  He was started on steroids for COPD exacerbation and antibiotics for infection.    He has had slow and gradual improvement, some of this improvement may be from starting morphine  Sputum culture now growing PsA - Continue 5 days azithromycin - Stop Rocephin - Start Cefepime - Continue prednisone - Consult PUlmonology, re: PsA, any treatment needed for MAC? - Continue Pulmicort and Brovana - Consult oncology, appreciate recommendations    Chronic pulmonary embolism - Continue Xarelto  Hyperlipidemia - Continue Lipitor  BPH History of right-sided ureteral stent - Continue Flomax - Outpatient urology follow up for stent removal  Non-small cell lung cancer Initially diagnosed December 2020, underwent carboplatin paclitaxel with radiation.  Then had radiographic evidence of disease progression in 2022 (no repeat biopsies), underwent carboplatin, Alimta, Keytruda.  Carboplatin stopped due to anaphylaxis.  Keytruda stopped due to concern for pneumonitis.  No radiographic evidence of progression since then.   Class I obesity BMI 31.3         Subjective: Patient had no  clinical change.  No fever, no change in respiratory status.     Physical Exam: BP 113/79 (BP Location: Right Arm)   Pulse 99   Temp 97.9 F (36.6 C) (Oral)   Resp 20   Ht 5\' 7"  (1.702 m)   Wt 90.7 kg   SpO2 99%   BMI 31.32 kg/m   Adult male, lying in bed, interactive and appropriate Tachycardic, regular, no murmurs, no peripheral edema Appears out of breath at rest, lung sounds diminished, coarse bilaterally, no wheezing Abdomen soft without tenderness palpation Attention normal, affect normal, judgment and insight appear normal, face symmetric, speech fluent, generalized weakness    Data Reviewed: Discussed with pulmonology CBC shows mild leukocytosis, hemoglobin 11.3, basic metabolic panel unremarkable  Family Communication:     Disposition: Status is: Inpatient         Author: Alberteen Sam, MD 04/22/2023 3:08 PM  For on call review [email or web address].

## 2023-04-22 NOTE — Progress Notes (Signed)
 Pharmacy Antibiotic Note  Philip [name] is a 68 y.o. male admitted on 04/18/2023 with dyspnea.  Pharmacy has been consulted for cefepime dosing for HCAP  04/22/2023 Day #5 ceftriaxone Day #4/5 azithromycin To change ceftriaxone > cefepime to cover Pseudomonas aeruginosa in sputum WBC up to 13.1 (on solumedrol), Scr 0.97 Afebrile PCT neg on 3/17  Plan: Cefepime 2 gm IV q8hrs   Height: 5\' 7"  (170.2 cm) Weight: 90.7 kg (199 lb 15.3 oz) IBW/kg (Calculated) : 66.1  Temp (24hrs), Avg:98 F (36.7 C), Min:97.7 F (36.5 C), Max:98.3 F (36.8 C)  Recent Labs  Lab 04/16/23 1304 04/18/23 1905 04/19/23 0400 04/22/23 0406  WBC 9.4 8.5 7.8 13.1*  CREATININE  --  0.94 0.88 0.97    Estimated Creatinine Clearance: 79.3 mL/min (by C-G formula based on SCr of 0.97 mg/dL).    Allergies  Allergen Reactions   Carboplatin Anaphylaxis, Shortness Of Breath, Cough and Hypertension    On dose 8. Became short of breath. Symptom management was called. Received benadryl, solumedrol, Pepcid and fluids. Medication discontinued.    Klonopin [Clonazepam] Other (See Comments)    nervous   Norco [Hydrocodone-Acetaminophen] Other (See Comments)    Made pt feel jittery     Antimicrobials this admission: 3/16 ceftriaxone >> 3/20 3/16 azithromycin>> 3/20 3/20 cefepime>>  Dose adjustments this admission:  Microbiology results: 3/17 strep pneumo neg 3/17 HIV NR 3/18 sputum: moderate Pseudomonas aeruginosa 3/17 BCx2 ngtd 3/16 RVP neg  Thank you for allowing pharmacy to be a part of this patient's care.  Herby Abraham, Pharm.D Use secure chat for questions 04/22/2023 1:58 PM

## 2023-04-22 NOTE — Assessment & Plan Note (Addendum)
 Multifactorial due to COPD with exacerbation, superimposed on underlying radiation-induced lung fibrosis, acute on chronic pulmonary embolism, community-acquired pneumonia, lung cancer, and bronchiectasis/MAC   Admitted and CT showed worsening interstitial opacities at baseline.  Ruled out new acute PE.  COVID, flu, RSV negative.  He was started on steroids for COPD exacerbation and antibiotics for infection.    His improvement was limited, and so sputum culture was obtained that grew PsA and he was started on cefepime.  Continue to have limited improvement and so Pulmonology were consulted who recommended Lasix. - Continue Cefepime - IV Lasix - Continue prednisone with plans for post-discharge taper - Consult Pulmonology, appreciate expertise - Continue Pulmicort and Brovana - Consult oncology, appreciate recommendations

## 2023-04-22 NOTE — Consult Note (Signed)
 NAME:  Philip [name], MRN:  [id number], DOB:  Jun 24, 1955, LOS: 3 ADMISSION DATE:  04/18/2023, CONSULTATION DATE:  04/22/23 REFERRING MD:  Maryfrances Bunnell CHIEF COMPLAINT:  Dyspnea   History of Present Illness:  Philip [name] is a 68 y.o. male who has a PMH as below including but not limited to stage III adenocarcinoma of the lung, recurrent non-small cell lung cancer, PE on Xarelto, radiation-induced IPF, COPD, chronic respiratory failure on 4-6 L of oxygen at baseline, pulmonary MAI, bronchiectasis.  He follows Dr. Judeth Horn in the clinic.    He presented to [facility], ED 3/16 with dyspnea, cough, tachycardia.  CTA chest was negative for PE, showed groundglass opacities bilaterally, small right pleural effusion, trace left.  He was admitted by the hospitalist for COPD exacerbation and possible pneumonia (started on ceftriaxone/azithromycin, steroids, bronchodilators).  COVID, flu, RSV all negative.  On 3/20 history and culture returned with Pseudomonas aeruginosa preliminarily, susceptibilities pending.  PCCM subsequently called in consultation for further guidance on antibiotics and additional treatments.  He tells me that he has been coughing up sputum intermittently occasionally greenish in color, other times it is clear and sometimes he does not cough anything up at all.  He gets dyspneic with minimal exertion.  He normally wears 6 L of oxygen but sometimes has to turn it up to 9 of 10.  He denies any chest pain, lightheadedness, fever/chills/sweats.  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  Dr. Judeth Horn recommended against monotherapy with macrolides due to risk of inducible resistance of MAI.  He has been treated with cefepime in the past for Pseudomonas.  Pertinent  Medical History:  has Lung mass; S/P bronchoscopy; S/P bronchoscopy with biopsy; Pneumothorax;  Adenocarcinoma of right lung, stage 3 (HCC); Goals of care, counseling/discussion; Encounter for antineoplastic chemotherapy; Tobacco abuse counseling; Odynophagia; Encounter for antineoplastic immunotherapy; Chronic pulmonary embolism; COPD (chronic obstructive pulmonary disease) (HCC); Pneumonia; MAI (mycobacterium avium-intracellulare) infection (HCC); Acute on chronic hypoxic respiratory failure (HCC); Healthcare maintenance; Allergic rhinitis; Pulmonary embolism (HCC); Recurrent non-small cell lung cancer (HCC); Neutropenia (HCC); COPD, moderate (HCC); Preoperative clearance; CAP (community acquired pneumonia); Tobacco abuse; BPH (benign prostatic hyperplasia); GERD (gastroesophageal reflux disease); History of lung cancer; Radiation-induced pulmonary fibrosis (HCC); Chronic migraine without aura; Hyperlipidemia; COPD with acute exacerbation (HCC); COPD exacerbation (HCC); and Class I obesity on their problem list.  Significant Hospital Events: Including procedures, antibiotic start and stop dates in addition to other pertinent events   3/16 admit 3/20 PCCM consult  Interim History / Subjective:  On 9L O2, breathing comfortably currently but gets dyspneic with any exertion. Currently not producing sputum but prior to hospitalization was intermittently producing sputum, occasionally green and other times clear in color.  Objective:  Blood pressure 113/79, pulse 99, temperature 97.9 F (36.6 C), temperature source Oral, resp. rate 20, height 5\' 7"  (1.702 m), weight 90.7 kg, SpO2 99%.    FiO2 (%):  [54 %] 54 %   Intake/Output Summary (Last 24 hours) at 04/22/2023 1518 Last data filed at 04/22/2023 0856 Gross per 24 hour  Intake 480 ml  Output 1000 ml  Net -520 ml   Filed Weights   04/19/23 1744  Weight: 90.7 kg    Examination: General: Adult male, resting in bed, in NAD. Neuro: A&O x 3, no deficits. HEENT: [place]/AT. Sclerae anicteric. EOMI. Cardiovascular: RRR, no M/R/G.  Lungs:  Respirations even and unlabored.  CTA bilaterally,  No W/R/R. Abdomen: BS x 4, soft, NT/ND.  Musculoskeletal: No gross deformities, no edema.  Skin: Intact, warm, no rashes.  Labs/imaging personally reviewed:  CTA chest 3/16 > no PE, post radiation changes R > L, GGO's bilaterally increased from prior, trace bilateral effusions.  Assessment & Plan:   PsA PNA - sputum culture from 3/19 preliminarily growing PsA with sensitivities pending. - Continue Cefepime until sensititives back then narrow as able. - D/c Azithromycin. - Follow cultures through completion. - Push bronchial hygiene.  AECOPD - 2/2 above. - Continue BD's, steroids (IV solumedrol for now, on chronic prednisone at baseline). - Morphine PRN air hunger and prior to activities to increase activity tolerance.  AoC hypoxic respiratory failure - 2/2 above. - Continue supplemental O2 as needed to maintain SpO2 > 88-90%. - Mobilize as able.  Recurrent NSCLC - s/p chemoradiation and immunotherapy 2020 and 2022, currently on observation. Radiation induced IPF. Hx pulmonary MAI. Chronic PE on Xarelto - Continue Xarelto. - Palliative care following. - Avoid monotherapy with macrolides due to risk of inducible resistance of MAI. - F/u as outpatient.   Rest per primary team.  Best practice (evaluated daily):  Per primary team.  Labs   CBC: Recent Labs  Lab 04/16/23 1304 04/18/23 1905 04/19/23 0400 04/22/23 0406  WBC 9.4 8.5 7.8 13.1*  NEUTROABS  --  7.1  --   --   HGB 13.7 13.3 12.6* 11.3*  HCT 46.3 44.3 41.8 38.1*  MCV 95.5 95.3 97.0 98.4  PLT 266 230 219 228    Basic Metabolic Panel: Recent Labs  Lab 04/18/23 1905 04/19/23 0400 04/22/23 0406  NA 139 140 137  K 4.5 4.0 4.6  CL 97* 101 100  CO2 33* 31 28  GLUCOSE 106* 154* 152*  BUN 19 15 27*  CREATININE 0.94 0.88 0.97  CALCIUM 9.2 8.7* 8.6*   GFR: Estimated Creatinine Clearance: 79.3 mL/min (by C-G formula based on SCr of 0.97 mg/dL). Recent  Labs  Lab 04/16/23 1304 04/18/23 1905 04/19/23 0400 04/22/23 0406  PROCALCITON  --   --  <0.10  --   WBC 9.4 8.5 7.8 13.1*    Liver Function Tests: Recent Labs  Lab 04/18/23 1905 04/19/23 0400  AST 22 17  ALT 16 14  ALKPHOS 77 70  BILITOT 0.5 0.4  PROT 7.3 6.6  ALBUMIN 3.4* 3.0*   No results for input(s): "LIPASE", "AMYLASE" in the last 168 hours. No results for input(s): "AMMONIA" in the last 168 hours.  ABG    Component Value Date/Time   HCO3 27.6 01/12/2022 0945   O2SAT 72.9 01/12/2022 0945     Coagulation Profile: Recent Labs  Lab 04/19/23 0400  INR 1.7*    Cardiac Enzymes: No results for input(s): "CKTOTAL", "CKMB", "CKMBINDEX", "TROPONINI" in the last 168 hours.  HbA1C: No results found for: "HGBA1C"  CBG: No results for input(s): "GLUCAP" in the last 168 hours.  Review of Systems:   All negative; except for those that are bolded, which indicate positives.  Constitutional: weight loss, weight gain, night sweats, fevers, chills, fatigue, weakness.  HEENT: headaches, sore throat, sneezing, nasal congestion, post nasal drip, difficulty swallowing, tooth/dental problems, visual complaints, visual changes, ear aches. Neuro: difficulty with speech, weakness, numbness, ataxia. CV:  chest pain, orthopnea, PND, swelling in lower extremities, dizziness, palpitations, syncope.  Resp: cough, hemoptysis, dyspnea, wheezing. GI: heartburn, indigestion, abdominal pain, nausea, vomiting, diarrhea, constipation, change in bowel habits, loss of appetite, hematemesis, melena, hematochezia.  GU: dysuria, change in  color of urine, urgency or frequency, flank pain, hematuria. MSK: joint pain or swelling, decreased range of motion. Psych: change in mood or affect, depression, anxiety, suicidal ideations, homicidal ideations. Skin: rash, itching, bruising.   Past Medical History:  He,  has a past medical history of Chronic kidney disease, COPD (chronic obstructive  pulmonary disease) (HCC), Dyspnea, GERD (gastroesophageal reflux disease), Headache, Hyperlipidemia, lung ca (dx'd 12/2018), On home oxygen therapy, Pneumonia, Pulmonary embolus (HCC), and Tobacco abuse.   Surgical History:   Past Surgical History:  Procedure Laterality Date   COLONOSCOPY     CYSTOSCOPY W/ URETERAL STENT PLACEMENT Right 10/24/2021   Procedure: CYSTOSCOPY WITH RETROGRADE PYELOGRAM/URETERAL STENT PLACEMENT;  Surgeon: Jannifer Hick, MD;  Location: [facility];  Service: Urology;  Laterality: Right;   CYSTOSCOPY WITH RETROGRADE PYELOGRAM, URETEROSCOPY AND STENT PLACEMENT Right 10/12/2022   Procedure: CYSTOSCOPY WITH RIGHT  RETROGRADE PYELOGRAM, RIGHT STENT EXCHANGE;  Surgeon: Jannifer Hick, MD;  Location: [facility];  Service: Urology;  Laterality: Right;   HAND SURGERY     VASECTOMY     VIDEO BRONCHOSCOPY WITH ENDOBRONCHIAL ULTRASOUND N/A 12/22/2018   Procedure: VIDEO BRONCHOSCOPY WITH ENDOBRONCHIAL ULTRASOUND;  Surgeon: Josephine Igo, DO;  Location: [facility];  Service: Thoracic;  Laterality: N/A;   VIDEO BRONCHOSCOPY WITH ENDOBRONCHIAL ULTRASOUND N/A 01/09/2019   Procedure: VIDEO BRONCHOSCOPY WITH ENDOBRONCHIAL ULTRASOUND WITH FLUORO;  Surgeon: Josephine Igo, DO;  Location: [facility];  Service: Thoracic;  Laterality: N/A;   VIDEO BRONCHOSCOPY WITH RADIAL ENDOBRONCHIAL ULTRASOUND N/A 12/22/2018   Procedure: RADIAL ENDOBRONCHIAL ULTRASOUND;  Surgeon: Josephine Igo, DO;  Location: [facility];  Service: Thoracic;  Laterality: N/A;     Social History:   reports that he quit smoking about 16 months ago. His smoking use included cigarettes. He started smoking about 52 years ago. He has a 102.8 pack-year smoking history. He has quit using smokeless tobacco.  His smokeless tobacco use included chew. He reports current alcohol use. He reports that he does not currently use drugs.   Family History:  His family history includes Cancer in his father; Stomach cancer in his father.    Allergies Allergies  Allergen Reactions   Carboplatin Anaphylaxis, Shortness Of Breath, Cough and Hypertension    On dose 8. Became short of breath. Symptom management was called. Received benadryl, solumedrol, Pepcid and fluids. Medication discontinued.    Klonopin [Clonazepam] Other (See Comments)    nervous   Norco [Hydrocodone-Acetaminophen] Other (See Comments)    Made pt feel jittery      Home Medications  Prior to Admission medications   Medication Sig Start Date End Date Taking? Authorizing Provider  albuterol (VENTOLIN HFA) 108 (90 Base) MCG/ACT inhaler Inhale 2 puffs into the lungs every 4 (four) hours as needed for wheezing or shortness of breath. 01/05/22  Yes Hunsucker, Lesia Sago, MD  BREZTRI AEROSPHERE 160-9-4.8 MCG/ACT AERO INHALE 2 PUFFS INTO THE LUNGS IN THE MORNING AND AT BEDTIME. 09/18/22  Yes Hunsucker, Lesia Sago, MD  dextromethorphan-guaiFENesin (MUCINEX DM) 30-600 MG 12hr tablet Take 1 tablet by mouth 2 (two) times daily as needed for cough. 03/17/20  Yes Rhetta Mura, MD  furosemide (LASIX) 20 MG tablet Take 1 tablet (20 mg total) by mouth daily as needed. Patient taking differently: Take 20 mg by mouth in the morning. 03/12/23  Yes Hunsucker, Lesia Sago, MD  HYDROcodone-acetaminophen (NORCO/VICODIN) 5-325 MG tablet Take 1 tablet by mouth every 12 (twelve) hours as needed for moderate pain (pain score 4-6). 04/05/23  Yes  Pickenpack-Cousar, Arty Baumgartner, NP  naphazoline-pheniramine (VISINE) 0.025-0.3 % ophthalmic solution Place 1 drop into both eyes daily as needed for eye irritation.   Yes [provider]  predniSONE (DELTASONE) 10 MG tablet Take 1 tablet (10 mg total) by mouth daily with breakfast. 07/30/22  Yes Hunsucker, Lesia Sago, MD  tamsulosin (FLOMAX) 0.4 MG CAPS capsule Take 0.4 mg by mouth at bedtime. 10/31/21  Yes [provider]  XARELTO 20 MG TABS tablet TAKE 1 TABLET BY MOUTH DAILY WITH SUPPER. 06/17/22  Yes Hunsucker, Lesia Sago, MD  Multiple  Vitamins-Minerals (MULTI ADULT GUMMIES) CHEW Chew 1 tablet by mouth daily. 09/02/21   [provider]     Rutherford Guys, PA - [facility] For pager details, please see AMION or use Epic chat  After 1900, please call [facility] for cross coverage needs 04/22/2023, 3:18 PM

## 2023-04-23 DIAGNOSIS — J449 Chronic obstructive pulmonary disease, unspecified: Secondary | ICD-10-CM | POA: Diagnosis not present

## 2023-04-23 DIAGNOSIS — J441 Chronic obstructive pulmonary disease with (acute) exacerbation: Secondary | ICD-10-CM | POA: Diagnosis not present

## 2023-04-23 DIAGNOSIS — J9621 Acute and chronic respiratory failure with hypoxia: Secondary | ICD-10-CM | POA: Diagnosis not present

## 2023-04-23 DIAGNOSIS — J151 Pneumonia due to Pseudomonas: Secondary | ICD-10-CM | POA: Diagnosis not present

## 2023-04-23 DIAGNOSIS — B965 Pseudomonas (aeruginosa) (mallei) (pseudomallei) as the cause of diseases classified elsewhere: Secondary | ICD-10-CM | POA: Diagnosis not present

## 2023-04-23 LAB — CULTURE, RESPIRATORY W GRAM STAIN

## 2023-04-23 LAB — COMPREHENSIVE METABOLIC PANEL
ALT: 20 U/L (ref 0–44)
AST: 21 U/L (ref 15–41)
Albumin: 2.9 g/dL — ABNORMAL LOW (ref 3.5–5.0)
Alkaline Phosphatase: 54 U/L (ref 38–126)
Anion gap: 8 (ref 5–15)
BUN: 31 mg/dL — ABNORMAL HIGH (ref 8–23)
CO2: 32 mmol/L (ref 22–32)
Calcium: 8.3 mg/dL — ABNORMAL LOW (ref 8.9–10.3)
Chloride: 96 mmol/L — ABNORMAL LOW (ref 98–111)
Creatinine, Ser: 1.04 mg/dL (ref 0.61–1.24)
GFR, Estimated: >60 mL/min (ref >60)
Glucose, Bld: 151 mg/dL — ABNORMAL HIGH (ref 70–99)
Potassium: 4.7 mmol/L (ref 3.5–5.1)
Sodium: 136 mmol/L (ref 135–145)
Total Bilirubin: 0.7 mg/dL (ref 0.0–1.2)
Total Protein: 6.1 g/dL — ABNORMAL LOW (ref 6.5–8.1)

## 2023-04-23 LAB — CBC
HCT: 38.5 % — ABNORMAL LOW (ref 39.0–52.0)
Hemoglobin: 11.8 g/dL — ABNORMAL LOW (ref 13.0–17.0)
MCH: 29.4 pg (ref 26.0–34.0)
MCHC: 30.6 g/dL (ref 30.0–36.0)
MCV: 96 fL (ref 80.0–100.0)
Platelets: 242 10*3/uL (ref 150–400)
RBC: 4.01 MIL/uL — ABNORMAL LOW (ref 4.22–5.81)
RDW: 14.7 % (ref 11.5–15.5)
WBC: 11.7 10*3/uL — ABNORMAL HIGH (ref 4.0–10.5)
nRBC: 0 % (ref 0.0–0.2)

## 2023-04-23 LAB — LEGIONELLA PNEUMOPHILA SEROGP 1 UR AG: L. pneumophila Serogp 1 Ur Ag: NEGATIVE

## 2023-04-23 MED ORDER — PREDNISONE 20 MG PO TABS
40.0000 mg | ORAL_TABLET | Freq: Every day | ORAL | Status: DC
  Administered 2023-04-24 – 2023-04-30 (×7): 40 mg via ORAL
  Filled 2023-04-23 (×7): qty 2

## 2023-04-23 NOTE — Plan of Care (Signed)

## 2023-04-23 NOTE — Consult Note (Signed)
 NAME:  Philip [name], MRN:  [id number], DOB:  [birth year], LOS: 4 ADMISSION DATE:  04/18/2023, CONSULTATION DATE:  04/22/23 REFERRING MD:  Maryfrances Bunnell CHIEF COMPLAINT:  Dyspnea   History of Present Illness:  Philip [name] is a 68 y.o. male who has a PMH as below including but not limited to stage III adenocarcinoma of the lung, recurrent non-small cell lung cancer, PE on Xarelto, radiation-induced IPF, COPD, chronic respiratory failure on 4-6 L of oxygen at baseline, pulmonary MAI, bronchiectasis.  He follows Dr. Judeth Horn in the clinic.    He presented to [facility], ED 3/16 with dyspnea, cough, tachycardia.  CTA chest was negative for PE, showed groundglass opacities bilaterally, small right pleural effusion, trace left.  He was admitted by the hospitalist for COPD exacerbation and possible pneumonia (started on ceftriaxone/azithromycin, steroids, bronchodilators).  COVID, flu, RSV all negative.  On 3/20 history and culture returned with Pseudomonas aeruginosa preliminarily, susceptibilities pending.  PCCM subsequently called in consultation for further guidance on antibiotics and additional treatments.  He tells me that he has been coughing up sputum intermittently occasionally greenish in color, other times it is clear and sometimes he does not cough anything up at all.  He gets dyspneic with minimal exertion.  He normally wears 6 L of oxygen but sometimes has to turn it up to 9 of 10.  He denies any chest pain, lightheadedness, fever/chills/sweats.  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  Dr. Judeth Horn recommended against monotherapy with macrolides due to risk of inducible resistance of MAI.  He has been treated with cefepime in the past for Pseudomonas.  Pertinent  Medical History:  has Lung mass; S/P bronchoscopy; S/P bronchoscopy with biopsy; Pneumothorax;  Adenocarcinoma of right lung, stage 3 (HCC); Goals of care, counseling/discussion; Encounter for antineoplastic chemotherapy; Tobacco abuse counseling; Odynophagia; Encounter for antineoplastic immunotherapy; Chronic pulmonary embolism; COPD (chronic obstructive pulmonary disease) (HCC); Pneumonia; MAI (mycobacterium avium-intracellulare) infection (HCC); Acute on chronic hypoxic respiratory failure (HCC); Healthcare maintenance; Allergic rhinitis; Pulmonary embolism (HCC); Recurrent non-small cell lung cancer (HCC); Neutropenia (HCC); COPD, moderate (HCC); Preoperative clearance; CAP (community acquired pneumonia); Tobacco abuse; BPH (benign prostatic hyperplasia); GERD (gastroesophageal reflux disease); History of lung cancer; Radiation-induced pulmonary fibrosis (HCC); Chronic migraine without aura; Hyperlipidemia; COPD with acute exacerbation (HCC); COPD exacerbation (HCC); and Class I obesity on their problem list.  Significant Hospital Events: Including procedures, antibiotic start and stop dates in addition to other pertinent events   3/16 admit 3/20 PCCM consult  Interim History / Subjective:   Feels better, congestion improved  Objective:  Blood pressure (!) 129/93, pulse (!) 103, temperature 97.8 F (36.6 C), temperature source Oral, resp. rate 20, height 5\' 7"  (1.702 m), weight 90.7 kg, SpO2 95%.        Intake/Output Summary (Last 24 hours) at 04/23/2023 1533 Last data filed at 04/23/2023 1015 Gross per 24 hour  Intake 720 ml  Output 1500 ml  Net -780 ml   Filed Weights   04/19/23 1744  Weight: 90.7 kg    Examination: Blood pressure (!) 129/93, pulse (!) 103, temperature 97.8 F (36.6 C), temperature source Oral, resp. rate 20, height 5\' 7"  (1.702 m), weight 90.7 kg, SpO2 95%. Gen:      No acute distress HEENT:  EOMI, sclera anicteric Neck:     No masses; no thyromegaly Lungs:    Scattered crackles CV:  Regular rate and rhythm; no murmurs Abd:      + bowel sounds;  soft, non-tender; no palpable masses, no distension Ext:    No edema; adequate peripheral perfusion Skin:      Warm and dry; no rash Neuro: alert and oriented x 3 Psych: normal mood and affect   Labs/imaging personally reviewed:  BUN/creatinine 31/1.04 WBC 11.7, hemoglobin 11.8 CTA chest 3/16 > no PE, post radiation changes R > L, GGO's bilaterally increased from prior, trace bilateral effusions.  Assessment & Plan:   PsA PNA - sputum culture from 3/19 preliminarily growing PsA with sensitivities pending. -Continue cefepime Q.  Can discharge on ciprofloxacin to complete 7-day course starting 3/20 - Follow cultures through completion. - Push bronchial hygiene. - Would not treat possible MAI as this is a chronic infection and can be dealt with as outpatient.  AECOPD - 2/2 above. - Continue BD's, steroids (IV solumedrol for now, on chronic prednisone at baseline). -Switch to prednisone 40 mg over the weekend and taper to baseline dose over the next week. - Morphine PRN air hunger and prior to activities to increase activity tolerance.  AoC hypoxic respiratory failure - 2/2 above. - Continue supplemental O2 as needed to maintain SpO2 > 88-90%. - Mobilize as able.  Recurrent NSCLC - s/p chemoradiation and immunotherapy 2020 and 2022, currently on observation. Radiation induced IPF. Hx pulmonary MAI. Chronic PE on Xarelto - Continue Xarelto. - Palliative care following. - Avoid monotherapy with macrolides due to risk of inducible resistance of MAI. - F/u as outpatient.  Will be available as needed.  Please call with questions.  Best practice (evaluated daily):  Per primary team.  Signature:   Chilton Greathouse MD [facility] See Amion for pager  If no response to pager , please call [phone] until 7pm After 7:00 pm call Elink  [phone] 04/23/2023, 3:34 PM

## 2023-04-23 NOTE — Progress Notes (Signed)
  Progress Note   Patient: Philip [name] ZOX:[id number] DOB: 06-Dec-1955 DOA: 04/18/2023     4 DOS: the patient was seen and examined on 04/23/2023 at 10:49AM      Brief hospital course: 68 y.o. M with NSCLC Stage IIIc, advanced COPD, radiation pneumonitis, hx pneumothorax, bronchiectasis, MAC, and chronic PE on Xarelto and chronic respiratory failure on 6-10L at home who presented with dyspnea.        Assessment and Plan: * Acute on chronic hypoxic respiratory failure (HCC) Pneumonia due to Pseudomonas Acute on chronic COPD exacerbation Patient states he is feeling clinically somewhat better - Continue cefepime - Transition to prednisone - Avoid azithromycin, no plans to treat MAI - Continue pulmonary toilet    Adenocarcinoma of right lung, stage 3 (HCC)   Class I obesity BMI 31.3  Hyperlipidemia - Continue Lipitor  BPH (benign prostatic hyperplasia) History of right-sided ureteral stent - Continue Flomax - Outpatient urology follow up for stent removal  Chronic pulmonary embolism - Continue Xarelto          Subjective: Patient is feeling somewhat better, his sputum is improving.  No fever, no confusion, no respiratory distress.     Physical Exam: BP (!) 129/93   Pulse (!) 103   Temp 97.8 F (36.6 C) (Oral)   Resp 20   Ht 5\' 7"  (1.702 m)   Wt 90.7 kg   SpO2 95%   BMI 31.32 kg/m   Adult male, lying in bed, interactive and appropriate Tachycardic, regular, no murmurs, JVP not visible Seems out of breath with talking, scattered crackles bilaterally, good air movement Abdomen soft, no tenderness palpation, no guarding or distention Attention normal, affect normal, judgment and insight appear normal    Data Reviewed: CBC abnormal Comprehensive metabolic panel unremarkable  Family Communication: None present    Disposition: Status is: Inpatient         Author: Alberteen Sam, MD 04/23/2023 4:43 PM  For on call review  [email or web address].

## 2023-04-24 DIAGNOSIS — J441 Chronic obstructive pulmonary disease with (acute) exacerbation: Secondary | ICD-10-CM | POA: Diagnosis not present

## 2023-04-24 DIAGNOSIS — J151 Pneumonia due to Pseudomonas: Secondary | ICD-10-CM | POA: Diagnosis not present

## 2023-04-24 DIAGNOSIS — J9621 Acute and chronic respiratory failure with hypoxia: Secondary | ICD-10-CM | POA: Diagnosis not present

## 2023-04-24 LAB — BASIC METABOLIC PANEL
Anion gap: 6 (ref 5–15)
BUN: 30 mg/dL — ABNORMAL HIGH (ref 8–23)
CO2: 37 mmol/L — ABNORMAL HIGH (ref 22–32)
Calcium: 8.7 mg/dL — ABNORMAL LOW (ref 8.9–10.3)
Chloride: 97 mmol/L — ABNORMAL LOW (ref 98–111)
Creatinine, Ser: 0.97 mg/dL (ref 0.61–1.24)
GFR, Estimated: >60 mL/min (ref >60)
Glucose, Bld: 99 mg/dL (ref 70–99)
Potassium: 5.3 mmol/L — ABNORMAL HIGH (ref 3.5–5.1)
Sodium: 140 mmol/L (ref 135–145)

## 2023-04-24 LAB — CBC
HCT: 40.9 % (ref 39.0–52.0)
Hemoglobin: 12 g/dL — ABNORMAL LOW (ref 13.0–17.0)
MCH: 28.9 pg (ref 26.0–34.0)
MCHC: 29.3 g/dL — ABNORMAL LOW (ref 30.0–36.0)
MCV: 98.6 fL (ref 80.0–100.0)
Platelets: 251 10*3/uL (ref 150–400)
RBC: 4.15 MIL/uL — ABNORMAL LOW (ref 4.22–5.81)
RDW: 14.6 % (ref 11.5–15.5)
WBC: 12.4 10*3/uL — ABNORMAL HIGH (ref 4.0–10.5)
nRBC: 0.2 % (ref 0.0–0.2)

## 2023-04-24 LAB — CULTURE, BLOOD (ROUTINE X 2)
Culture: NO GROWTH
Culture: NO GROWTH

## 2023-04-24 MED ORDER — GUAIFENESIN-DM 100-10 MG/5ML PO SYRP
5.0000 mL | ORAL_SOLUTION | ORAL | Status: DC | PRN
  Administered 2023-04-24: 5 mL via ORAL
  Filled 2023-04-24: qty 10

## 2023-04-24 MED ORDER — ALUM & MAG HYDROXIDE-SIMETH 200-200-20 MG/5ML PO SUSP
15.0000 mL | ORAL | Status: DC | PRN
  Administered 2023-04-24 – 2023-05-04 (×12): 15 mL via ORAL
  Filled 2023-04-24 (×12): qty 30

## 2023-04-24 NOTE — Progress Notes (Signed)
  Progress Note   Patient: Philip [name] [id number]:[id number] DOB: [birth year] DOA: 04/18/2023     5 DOS: the patient was seen and examined on 04/24/2023 at 1:38PM      Brief hospital course: 68 y.o. M with NSCLC Stage IIIc, advanced COPD, radiation pneumonitis, hx pneumothorax, bronchiectasis, MAC, and chronic PE on Xarelto and chronic respiratory failure on 6-10L at home who presented with dyspnea.        Assessment and Plan: * Acute on chronic hypoxic respiratory failure (HCC) -Continue cefepime, day 3 of 7 - Continue prednisone - Continue Pulmicort and Brovana    Adenocarcinoma of right lung, stage 3 (HCC)  Class I obesity BMI 31.3  Hyperlipidemia - Continue Lipitor  BPH (benign prostatic hyperplasia) History of right-sided ureteral stent - Continue Flomax - Outpatient urology follow up for stent removal  Chronic pulmonary embolism - Continue Xarelto  Hyperkalemia - Monitor BMP - Continue furosemide        Subjective: Patient still feels very weak and hypoxic.  Minimal exertional capacity.  No fever, no confusion      Physical Exam: BP 116/83 (BP Location: Left Arm)   Pulse (!) 112   Temp 98.5 F (36.9 C) (Oral)   Resp 17   Ht 5\' 7"  (1.702 m)   Wt 90.7 kg   SpO2 92%   BMI 31.32 kg/m   Adult male, lying in bed, appropriately interactive Tachycardic, regular, no murmurs, JVP visible Seems out of breath just with talking or coughing, lung sounds diminished with some crackles scattered bilaterally, good air movement, no wheezing Abdomen soft, no guarding or distention, no tenderness to palpation Attention normal, affect appropriate, judgment and insight appear normal, strength normal   Data Reviewed: Basic metabolic panel shows mild hypokalemia, stable renal function   Family Communication: Wife by phone   Disposition: Status is: Inpatient         Author: Alberteen Sam, MD 04/24/2023 1:54 PM  For on call review  [email or web address].

## 2023-04-25 DIAGNOSIS — J441 Chronic obstructive pulmonary disease with (acute) exacerbation: Secondary | ICD-10-CM | POA: Diagnosis not present

## 2023-04-25 DIAGNOSIS — J151 Pneumonia due to Pseudomonas: Secondary | ICD-10-CM | POA: Diagnosis not present

## 2023-04-25 DIAGNOSIS — J9621 Acute and chronic respiratory failure with hypoxia: Secondary | ICD-10-CM | POA: Diagnosis not present

## 2023-04-25 LAB — BASIC METABOLIC PANEL
Anion gap: 8 (ref 5–15)
BUN: 24 mg/dL — ABNORMAL HIGH (ref 8–23)
CO2: 33 mmol/L — ABNORMAL HIGH (ref 22–32)
Calcium: 8.2 mg/dL — ABNORMAL LOW (ref 8.9–10.3)
Chloride: 96 mmol/L — ABNORMAL LOW (ref 98–111)
Creatinine, Ser: 0.78 mg/dL (ref 0.61–1.24)
GFR, Estimated: >60 mL/min (ref >60)
Glucose, Bld: 105 mg/dL — ABNORMAL HIGH (ref 70–99)
Potassium: 4.6 mmol/L (ref 3.5–5.1)
Sodium: 137 mmol/L (ref 135–145)

## 2023-04-25 LAB — CBC
HCT: 41.1 % (ref 39.0–52.0)
Hemoglobin: 12.2 g/dL — ABNORMAL LOW (ref 13.0–17.0)
MCH: 29.3 pg (ref 26.0–34.0)
MCHC: 29.7 g/dL — ABNORMAL LOW (ref 30.0–36.0)
MCV: 98.8 fL (ref 80.0–100.0)
Platelets: 216 10*3/uL (ref 150–400)
RBC: 4.16 MIL/uL — ABNORMAL LOW (ref 4.22–5.81)
RDW: 14.3 % (ref 11.5–15.5)
WBC: 11 10*3/uL — ABNORMAL HIGH (ref 4.0–10.5)
nRBC: 0.2 % (ref 0.0–0.2)

## 2023-04-25 MED ORDER — CIPROFLOXACIN HCL 500 MG PO TABS: 500.0000 mg | ORAL_TABLET | Freq: Two times a day (BID) | ORAL | 0 refills | Status: AC

## 2023-04-25 MED ORDER — PREDNISONE 10 MG PO TABS: ORAL_TABLET | ORAL | 3 refills | Status: DC

## 2023-04-25 MED ORDER — MORPHINE SULFATE 10 MG/5ML PO SOLN
2.5000 mg | Freq: Four times a day (QID) | ORAL | 0 refills | Status: DC | PRN
Start: 2023-04-25 — End: 2023-04-26

## 2023-04-25 NOTE — Progress Notes (Signed)
 Mobility Specialist - Progress Note  Pre-mobility: 94% SpO2 (HFNC 8L) During mobility: 85-90% SpO2 (19L Non-rebreather mask) Post-mobility: 92% SPO2 (HFNC 8L)   04/25/23 1435  Mobility  Activity Ambulated with assistance in room;Transferred to/from [facility]  Level of Assistance Standby assist, set-up cues, supervision of patient - no hands on  Assistive Device None  Distance Ambulated (ft) 10 ft  Range of Motion/Exercises Active  Activity Response Tolerated fair  Mobility visit 1 Mobility  Mobility Specialist Start Time (ACUTE ONLY) 1410  Mobility Specialist Stop Time (ACUTE ONLY) 1430  Mobility Specialist Time Calculation (min) (ACUTE ONLY) 20 min   Pt was found in bed and agreeable to mobilize. Pt able to transfer to [facility] SPO2 decrease to 88%. Able to recover within 1 min. Afterwards ambulated around room with SPO2 decreasing to mid 80s and able to recover within 1 min. At EOS returned to bed with all needs met. Call bell in reach and RN notified. (Pt stated using 19L Non-rebreather mask during ambulation at home)   Billey Chang Mobility Specialist

## 2023-04-25 NOTE — Progress Notes (Signed)
  Progress Note   Patient: Philip [name] NGE:[id number] DOB: 02-23-1955 DOA: 04/18/2023     6 DOS: the patient was seen and examined on 04/25/2023 at 10:02 AM      Brief hospital course: 68 y.o. M with NSCLC Stage IIIc, advanced COPD, radiation pneumonitis, hx pneumothorax, bronchiectasis, MAC, and chronic PE on Xarelto and chronic respiratory failure on 6-10L at home who presented with dyspnea.        Assessment and Plan: * Acute on chronic hypoxic respiratory failure (HCC) -Continue cefepime, day 4 of 7 - Continue prednisone - Continue Pulmicort and Brovana    Adenocarcinoma of right lung, stage 3 (HCC)  Class I obesity BMI 31.3  Hyperlipidemia - Continue Lipitor  BPH (benign prostatic hyperplasia) History of right-sided ureteral stent - Continue Flomax - Outpatient urology follow up for stent removal  Chronic pulmonary embolism - Continue Xarelto  Hyperkalemia - Monitor BMP - Continue furosemide        Subjective: No clinical change, few steps.  He is requiring 10 L of oxygen.      Physical Exam: BP 108/82 (BP Location: Left Arm)   Pulse (!) 105   Temp 98.7 F (37.1 C) (Oral)   Resp 20   Ht 5\' 7"  (1.702 m)   Wt 90.7 kg   SpO2 98%   BMI 31.32 kg/m   Adult male, appropriately interactive Tachycardic, regular, JVP not visible Hard of breath at rest, coughing frequently, lung sounds diminished with scattered crackles bilaterally, no wheezing Abdomen soft, no guarding or distention No TTP Attention normal, affect appropriate, judgment and insight appear normal, strength seems normal     Data Reviewed:     Family Communication:    Disposition: Status is: Inpatient         Author: Alberteen Sam, MD 04/25/2023 4:21 PM  For on call review [email or web address].

## 2023-04-26 ENCOUNTER — Other Ambulatory Visit (HOSPITAL_COMMUNITY): Payer: Self-pay

## 2023-04-26 ENCOUNTER — Encounter: Payer: Self-pay | Admitting: Internal Medicine

## 2023-04-26 ENCOUNTER — Encounter: Payer: Self-pay | Admitting: Physician Assistant

## 2023-04-26 DIAGNOSIS — J189 Pneumonia, unspecified organism: Secondary | ICD-10-CM

## 2023-04-26 DIAGNOSIS — J9621 Acute and chronic respiratory failure with hypoxia: Secondary | ICD-10-CM | POA: Diagnosis not present

## 2023-04-26 LAB — BASIC METABOLIC PANEL
Anion gap: 9 (ref 5–15)
BUN: 25 mg/dL — ABNORMAL HIGH (ref 8–23)
CO2: 32 mmol/L (ref 22–32)
Calcium: 8.2 mg/dL — ABNORMAL LOW (ref 8.9–10.3)
Chloride: 98 mmol/L (ref 98–111)
Creatinine, Ser: 0.85 mg/dL (ref 0.61–1.24)
GFR, Estimated: >60 mL/min (ref >60)
Glucose, Bld: 144 mg/dL — ABNORMAL HIGH (ref 70–99)
Potassium: 4.5 mmol/L (ref 3.5–5.1)
Sodium: 139 mmol/L (ref 135–145)

## 2023-04-26 LAB — CBC
HCT: 38.3 % — ABNORMAL LOW (ref 39.0–52.0)
Hemoglobin: 11.6 g/dL — ABNORMAL LOW (ref 13.0–17.0)
MCH: 29.6 pg (ref 26.0–34.0)
MCHC: 30.3 g/dL (ref 30.0–36.0)
MCV: 97.7 fL (ref 80.0–100.0)
Platelets: 226 10*3/uL (ref 150–400)
RBC: 3.92 MIL/uL — ABNORMAL LOW (ref 4.22–5.81)
RDW: 14.5 % (ref 11.5–15.5)
WBC: 11 10*3/uL — ABNORMAL HIGH (ref 4.0–10.5)
nRBC: 0 % (ref 0.0–0.2)

## 2023-04-26 MED ORDER — MORPHINE SULFATE 20 MG/5ML PO SOLN
2.5000 mg | Freq: Four times a day (QID) | ORAL | 0 refills | Status: DC | PRN
  Filled 2023-04-26: qty 80, 34d supply, fill #0

## 2023-04-26 MED ORDER — FUROSEMIDE 10 MG/ML IJ SOLN
40.0000 mg | Freq: Once | INTRAMUSCULAR | Status: AC
Start: 2023-04-26 — End: 2023-04-26
  Administered 2023-04-26: 40 mg via INTRAVENOUS
  Filled 2023-04-26: qty 4

## 2023-04-26 NOTE — Progress Notes (Signed)
 NAME:  Philip [name], MRN:  [id number], DOB:  06/27/1955, LOS: 7 ADMISSION DATE:  04/18/2023, CONSULTATION DATE:  04/22/2023 REFERRING MD:  Maryfrances Bunnell - [facility] CHIEF COMPLAINT:  Dyspnea   History of Present Illness:  Philip [name] is a 68 y.o. male who has a PMH as below including but not limited to stage III adenocarcinoma of the lung, recurrent non-small cell lung cancer, PE on Xarelto, radiation-induced IPF, COPD, chronic respiratory failure on 4-6 L of oxygen at baseline, pulmonary MAI, bronchiectasis.  He follows Dr. Judeth Horn in the clinic.    He presented to [facility], ED 3/16 with dyspnea, cough, tachycardia.  CTA chest was negative for PE, showed groundglass opacities bilaterally, small right pleural effusion, trace left.  He was admitted by the hospitalist for COPD exacerbation and possible pneumonia (started on ceftriaxone/azithromycin, steroids, bronchodilators).  COVID, flu, RSV all negative.  On 3/20 history and culture returned with Pseudomonas aeruginosa preliminarily, susceptibilities pending.  PCCM subsequently called in consultation for further guidance on antibiotics and additional treatments.  He tells me that he has been coughing up sputum intermittently occasionally greenish in color, other times it is clear and sometimes he does not cough anything up at all.  He gets dyspneic with minimal exertion.  He normally wears 6 L of oxygen but sometimes has to turn it up to 9 of 10.  He denies any chest pain, lightheadedness, fever/chills/sweats.  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  Dr. Judeth Horn recommended against monotherapy with macrolides due to risk of inducible resistance of MAI.  He has been treated with cefepime in the past for Pseudomonas.  Pertinent Medical History:   Past Medical History:  Diagnosis Date   Chronic kidney disease     COPD (chronic obstructive pulmonary disease) (HCC)    Dyspnea    GERD (gastroesophageal reflux disease)    Headache    migraines   Hyperlipidemia    lung ca dx'd 12/2018   On home oxygen therapy    3 to 10 L   Pneumonia    Pulmonary embolus (HCC)    Tobacco abuse    Significant Hospital Events: Including procedures, antibiotic start and stop dates in addition to other pertinent events   3/16 Admit 3/20 PCCM consult  Interim History/Subjective:  No significant events overnight Feeling ok overall, still with cough productive of green-yellow sputum Remains on [place] 7LNC Mild pedal edema/bilateral ankle swelling and soreness, bibasilar crackles Suspect could benefit from Lasix trial Urine dark, tea-colored Hasn't had BM in 3 days  Objective:  Blood pressure 114/80, pulse 98, temperature 98 F (36.7 C), temperature source Oral, resp. rate 16, height 5\' 7"  (1.702 m), weight 90.7 kg, SpO2 95%.        Intake/Output Summary (Last 24 hours) at 04/26/2023 1154 Last data filed at 04/26/2023 4098 Gross per 24 hour  Intake 540 ml  Output 1125 ml  Net -585 ml   Filed Weights   04/19/23 1744  Weight: 90.7 kg   Physical Examination: General: Acutely ill-appearing middle-aged man in NAD. Pleasant and conversant. HEENT: [place]/AT, anicteric sclera, PERRL, moist mucous membranes. Neuro: Awake, oriented x 4. Responds to verbal stimuli. Following commands consistently. Moves all 4 extremities spontaneously. CV: RRR, no m/g/r. PULM: Breathing even and mildly labored on 7L [place]; mild conversational dyspnea. Lung fields with scattered rhonchi in upper fields, faint bibasilar crackles. GI: Firm but compressible, nontender, moderately  distended. Hypoactive bowel sounds. Extremities: Bilateral nonpitting 1+ ankle/pedal edema noted. Skin: Warm/dry, no rashes.  Labs/imaging personally reviewed:  BUN/creatinine 31/1.04 WBC 11.7, hemoglobin 11.8 CTA chest 3/16 > no PE, post radiation changes R  > L, GGO's bilaterally increased from prior, trace bilateral effusions.  Assessment & Plan:   PsA PNA Sputum culture from 3/19 preliminarily growing PsA, pan-sensitive. - Continue cefepime while in-house; can discharge on ciprofloxacin for 7-day total course - Follow Cx until finalized - Pulmonary hygiene (IS/flutter, chest PT) - No indication for acute treatment of MAI; likely chronic/can be managed in the outpatient setting  AECOPD AoC hypoxic respiratory failure Secondary to above. - Supplemental O2 support for sat 88-92% - Continue bronchodilators - Continue steroids - Pulmonary hygiene - Trial of diuresis, as patient has crackles on exam 3/24; Lasix 40mg  IV x 1 - Monitor I&Os, daily weights - Morphine PRN for air hunger - Follow CXR  Recurrent NSCLC - s/p chemoradiation and immunotherapy 2020 and 2022, currently on observation. Radiation induced IPF Hx pulmonary MAI Chronic PE on Xarelto - Continue Xarelto - PMT following - Avoid monotherapy with macrolides, given risk of resistance - Outpatient pulmonary follow up  PCCM will continue to follow with you.  Best practice (evaluated daily):  Per Primary Team  Signature:   Faythe Ghee [facility] 04/26/23 11:54 AM  Please see [email or web address] for pager details.  From 7A-7P if no response, please call [phone] After hours, please call ELink [phone]

## 2023-04-26 NOTE — Plan of Care (Signed)
  Problem: Education: Goal: Knowledge of General Education information will improve Description: Including pain rating scale, medication(s)/side effects and non-pharmacologic comfort measures Outcome: Progressing   Problem: Activity: Goal: Risk for activity intolerance will decrease Outcome: Progressing   Problem: Nutrition: Goal: Adequate nutrition will be maintained Outcome: Progressing   Problem: Pain Managment: Goal: General experience of comfort will improve and/or be controlled Outcome: Progressing   Problem: Elimination: Goal: Will not experience complications related to bowel motility Outcome: Progressing Goal: Will not experience complications related to urinary retention Outcome: Progressing   Problem: Safety: Goal: Ability to remain free from injury will improve Outcome: Progressing

## 2023-04-26 NOTE — Progress Notes (Signed)
 I have reviewed and concur with this student's documentation.   Carrie Schoonmaker Futures trader, RN 04/26/2023 5:10 PM

## 2023-04-26 NOTE — Plan of Care (Signed)
 Patients respiratory pattern is still labored with activity for much of day.  Vitals otherwise remain stable.

## 2023-04-26 NOTE — Progress Notes (Signed)
  Progress Note   Patient: Philip [name] [id number] DOB: March 20, 1955 DOA: 04/18/2023     7 DOS: the patient was seen and examined on 04/26/2023 at 11:50AM      Brief hospital course: 68 y.o. M with NSCLC Stage IIIc, advanced COPD, radiation pneumonitis, hx pneumothorax, bronchiectasis, MAC, and chronic PE on Xarelto and chronic respiratory failure on 6-10L at home who presented with dyspnea.        Assessment and Plan: * Acute on chronic hypoxic respiratory failure (HCC) - Continue Cefepime - IV Lasix - Continue prednisone with plans for post-discharge taper - Consult Pulmonology, appreciate expertise - Continue Pulmicort and Brovana - Consult oncology, appreciate recommendations    Adenocarcinoma of right lung, stage 3 (HCC)    Class I obesity BMI 31.3  Hyperlipidemia - Continue Lipitor  BPH (benign prostatic hyperplasia) History of right-sided ureteral stent - Continue Flomax - Outpatient urology follow up for stent removal  Chronic pulmonary embolism - Continue Xarelto          Subjective: Patient has no clinical change.  No fever, no sputum.     Physical Exam: BP 129/80 (BP Location: Left Arm)   Pulse (!) 109   Temp (!) 97.5 F (36.4 C) (Oral)   Resp 18   Ht 5\' 7"  (1.702 m)   Wt 90.7 kg   SpO2 96%   BMI 31.32 kg/m   Adult male, lying in bed, interactive and appropriate Tachycardic, regular, JVP not visible, no peripheral edema Respiratory rate seems increased, he sounds out of breath at rest, he coughs frequently, lung sounds are diminished, scattered crackles bilaterally, no wheezing Abdomen soft, no guarding or distention, no tenderness palpation Attention normal, affect appropriate, judgment and insight appear normal   Data Reviewed: Discussed with pulmonology Basic metabolic panel shows stable renal function, normal electrolytes CBC shows no leukocytosis, mild anemia  Family Communication: Wife at the  bedside    Disposition: Status is: Inpatient         Author: Alberteen Sam, MD 04/26/2023 5:51 PM  For on call review [email or web address].

## 2023-04-26 NOTE — Progress Notes (Signed)
 Pharmacy Antibiotic Note  Philip [name] is a 68 y.o. male admitted on 04/18/2023 with dyspnea.  Pharmacy consulted for cefepime dosing for pseudomonas aeruginosa HCAP.  Plan: Continue Cefepime 2 gm IV q8hrs  F/U LOT - notes indicate 7 days of antimicrobial therapy planned  Height: 5\' 7"  (170.2 cm) Weight: 90.7 kg (199 lb 15.3 oz) IBW/kg (Calculated) : 66.1  Temp (24hrs), Avg:97.8 F (36.6 C), Min:97.6 F (36.4 C), Max:98 F (36.7 C)  Recent Labs  Lab 04/22/23 0406 04/23/23 0340 04/24/23 0428 04/25/23 0353 04/26/23 0349  WBC 13.1* 11.7* 12.4* 11.0* 11.0*  CREATININE 0.97 1.04 0.97 0.78 0.85    Estimated Creatinine Clearance: 90.5 mL/min (by C-G formula based on SCr of 0.85 mg/dL).    Allergies  Allergen Reactions   Carboplatin Anaphylaxis, Shortness Of Breath, Cough and Hypertension    On dose 8. Became short of breath. Symptom management was called. Received benadryl, solumedrol, Pepcid and fluids. Medication discontinued.    Klonopin [Clonazepam] Other (See Comments)    nervous   Norco [Hydrocodone-Acetaminophen] Other (See Comments)    Made pt feel jittery     Antimicrobials this admission: 3/16 ceftriaxone >> 3/20 3/16 azithromycin>> 3/20 3/20 cefepime>>   Microbiology results: 3/17 strep pneumo neg 3/17 HIV NR 3/18 sputum: moderate Pseudomonas aeruginosa 3/17 BCx2 ngtd 3/16 RVP neg  Thank you for allowing pharmacy to be a part of this patient's care.  Selinda Eon, PharmD, BCPS Clinical Pharmacist [facility] 04/26/2023 9:37 AM

## 2023-04-27 DIAGNOSIS — J151 Pneumonia due to Pseudomonas: Secondary | ICD-10-CM | POA: Diagnosis not present

## 2023-04-27 DIAGNOSIS — J9621 Acute and chronic respiratory failure with hypoxia: Secondary | ICD-10-CM | POA: Diagnosis not present

## 2023-04-27 LAB — BASIC METABOLIC PANEL
Anion gap: 9 (ref 5–15)
BUN: 26 mg/dL — ABNORMAL HIGH (ref 8–23)
CO2: 35 mmol/L — ABNORMAL HIGH (ref 22–32)
Calcium: 8.2 mg/dL — ABNORMAL LOW (ref 8.9–10.3)
Chloride: 94 mmol/L — ABNORMAL LOW (ref 98–111)
Creatinine, Ser: 0.9 mg/dL (ref 0.61–1.24)
GFR, Estimated: >60 mL/min (ref >60)
Glucose, Bld: 104 mg/dL — ABNORMAL HIGH (ref 70–99)
Potassium: 3.9 mmol/L (ref 3.5–5.1)
Sodium: 138 mmol/L (ref 135–145)

## 2023-04-27 LAB — CBC
HCT: 38.2 % — ABNORMAL LOW (ref 39.0–52.0)
Hemoglobin: 11.8 g/dL — ABNORMAL LOW (ref 13.0–17.0)
MCH: 29.4 pg (ref 26.0–34.0)
MCHC: 30.9 g/dL (ref 30.0–36.0)
MCV: 95 fL (ref 80.0–100.0)
Platelets: 222 10*3/uL (ref 150–400)
RBC: 4.02 MIL/uL — ABNORMAL LOW (ref 4.22–5.81)
RDW: 14.6 % (ref 11.5–15.5)
WBC: 9.5 10*3/uL (ref 4.0–10.5)
nRBC: 0 % (ref 0.0–0.2)

## 2023-04-27 MED ORDER — POLYETHYLENE GLYCOL 3350 17 G PO PACK
17.0000 g | PACK | Freq: Two times a day (BID) | ORAL | Status: DC
  Administered 2023-04-27 – 2023-05-02 (×6): 17 g via ORAL
  Filled 2023-04-27 (×14): qty 1

## 2023-04-27 MED ORDER — MORPHINE SULFATE 10 MG/5ML PO SOLN
2.5000 mg | ORAL | Status: DC | PRN
  Administered 2023-04-27 – 2023-04-30 (×9): 2.5 mg via ORAL
  Filled 2023-04-27 (×10): qty 5

## 2023-04-27 MED ORDER — FUROSEMIDE 10 MG/ML IJ SOLN
20.0000 mg | Freq: Once | INTRAMUSCULAR | Status: AC
Start: 2023-04-27 — End: 2023-04-27
  Administered 2023-04-27: 20 mg via INTRAVENOUS
  Filled 2023-04-27: qty 2

## 2023-04-27 MED ORDER — FUROSEMIDE 10 MG/ML IJ SOLN: 40.0000 mg | Freq: Once | INTRAMUSCULAR | Status: DC

## 2023-04-27 MED ORDER — BISACODYL 10 MG RE SUPP
10.0000 mg | Freq: Once | RECTAL | Status: AC
  Administered 2023-04-27: 10 mg via RECTAL
  Filled 2023-04-27: qty 1

## 2023-04-27 MED ORDER — SENNOSIDES-DOCUSATE SODIUM 8.6-50 MG PO TABS
1.0000 | ORAL_TABLET | Freq: Every day | ORAL | Status: DC
  Administered 2023-04-27 – 2023-05-02 (×4): 1 via ORAL
  Filled 2023-04-27 (×6): qty 1

## 2023-04-27 NOTE — Progress Notes (Signed)
 NAME:  Philip [name], MRN:  [id number], DOB:  [birth year], LOS: 8 ADMISSION DATE:  04/18/2023, CONSULTATION DATE:  04/22/2023 REFERRING MD:  Maryfrances Bunnell - [facility] CHIEF COMPLAINT:  Dyspnea   History of Present Illness:  Philip [name] is a 68 y.o. male who has a PMH as below including but not limited to stage III adenocarcinoma of the lung, recurrent non-small cell lung cancer, PE on Xarelto, radiation-induced IPF, COPD, chronic respiratory failure on 4-6 L of oxygen at baseline, pulmonary MAI, bronchiectasis.  He follows Dr. Judeth Horn in the clinic.    He presented to [facility], ED 3/16 with dyspnea, cough, tachycardia.  CTA chest was negative for PE, showed groundglass opacities bilaterally, small right pleural effusion, trace left.  He was admitted by the hospitalist for COPD exacerbation and possible pneumonia (started on ceftriaxone/azithromycin, steroids, bronchodilators).  COVID, flu, RSV all negative.  On 3/20 history and culture returned with Pseudomonas aeruginosa preliminarily, susceptibilities pending.  PCCM subsequently called in consultation for further guidance on antibiotics and additional treatments.  He tells me that he has been coughing up sputum intermittently occasionally greenish in color, other times it is clear and sometimes he does not cough anything up at all.  He gets dyspneic with minimal exertion.  He normally wears 6 L of oxygen but sometimes has to turn it up to 9 of 10.  He denies any chest pain, lightheadedness, fever/chills/sweats.  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  Dr. Judeth Horn recommended against monotherapy with macrolides due to risk of inducible resistance of MAI.  He has been treated with cefepime in the past for Pseudomonas.  Pertinent Medical History:   Past Medical History:  Diagnosis Date   Chronic kidney disease     COPD (chronic obstructive pulmonary disease) (HCC)    Dyspnea    GERD (gastroesophageal reflux disease)    Headache    migraines   Hyperlipidemia    lung ca dx'd 12/2018   On home oxygen therapy    3 to 10 L   Pneumonia    Pulmonary embolus (HCC)    Tobacco abuse    Significant Hospital Events: Including procedures, antibiotic start and stop dates in addition to other pertinent events   3/16 Admit 3/20 PCCM consult 3/25 diuresing and O2 requirement down-trending   Interim History/Subjective:  Down to 6L [place],  desaturated with ambulation, though normally has to wear 15L non-rebreather at home before walking 3.5L UOP yesterday after lasix  Objective:  Blood pressure 101/76, pulse (!) 114, temperature 97.9 F (36.6 C), temperature source Oral, resp. rate (!) 21, height 5\' 7"  (1.702 m), weight 90.7 kg, SpO2 95%.        Intake/Output Summary (Last 24 hours) at 04/27/2023 1458 Last data filed at 04/27/2023 1257 Gross per 24 hour  Intake 480 ml  Output 3600 ml  Net -3120 ml   Filed Weights   04/19/23 1744  Weight: 90.7 kg   Physical Examination: General: chronically and acutely ill-appearing middle-aged man in NAD. HEENT: [place]/AT, anicteric sclera, PERRL, moist mucous membranes. Neuro: Awake, oriented x 4. Responds to verbal stimuli. Following commands consistently. Moves all 4 extremities spontaneously. CV: RRR, no m/g/r. PULM: no tachypnea or accessory muscle use on 7L [place] , scattered lower lung field crackles  GI: Firm but compressible, nontender, moderately distended. Hypoactive bowel sounds. Extremities: Bilateral trace ankle/pedal edema noted. Skin: Warm/dry, no rashes.  Labs/imaging  personally reviewed:   CTA chest 3/16 > no PE, post radiation changes R > L, GGO's bilaterally increased from prior, trace bilateral effusions.  Assessment & Plan:   Pseudomonas PNA Sputum culture from 3/19 preliminarily growing PsA, pan-sensitive. - Continue cefepime while  in-house; can discharge on ciprofloxacin for 7-day total course - BC final, no growth  - Pulmonary hygiene (IS/flutter, chest PT) - No indication for acute treatment of MAI; likely chronic/can be managed in the outpatient setting  AECOPD AoC hypoxic respiratory failure Secondary to above. - Supplemental O2 support for sat 88-92% - Continue bronchodilators - Continue steroids - Pulmonary hygiene - diuresed well with lasix 40mg  yesterday, UOP 3.6L, recommend additional lasix today and check Echo, may need discharged with outpatient Lasix  - Monitor I&Os, daily weights - Morphine PRN for air hunger - Follow CXR  Recurrent NSCLC - s/p chemoradiation and immunotherapy 2020 and 2022, currently on observation. Radiation induced IPF Hx pulmonary MAI Chronic PE on Xarelto - Continue Xarelto - PMT following - Avoid monotherapy with macrolides given risk of resistance - Outpatient pulmonary follow up  PCCM will continue to follow with you.  Best practice (evaluated daily):  Per Primary Team  Signature:   Darcella Gasman Liona Wengert, [name] [facility] 04/27/23 2:58 PM  Please see [email or web address] for pager details.  From 7A-7P if no response, please call [phone] After hours, please call ELink [phone]

## 2023-04-27 NOTE — Progress Notes (Signed)
 Progress Note   Patient: Philip [name] ZOX:[id number] DOB: 02-05-1955 DOA: 04/18/2023     8 DOS: the patient was seen and examined on 04/27/2023 at 1:30PM      Brief hospital course: 68 y.o. M with NSCLC Stage IIIc, advanced COPD, radiation pneumonitis, hx pneumothorax, bronchiectasis, MAC, and chronic PE on Xarelto and chronic respiratory failure on 6-10L at home and chronic right ureteral stent, who presented with dyspnea.     Started on steroids, diuretics, antibiotics.  Pulmonology consulted.     Assessment and Plan: * Acute on chronic hypoxic respiratory failure (HCC) Multifactorial due to COPD with exacerbation, superimposed on underlying radiation-induced lung fibrosis, acute on chronic pulmonary embolism, community-acquired pneumonia, lung cancer, and bronchiectasis/MAC   Admitted and CT showed worsening interstitial opacities at baseline.  Ruled out new acute PE.  COVID, flu, RSV negative.  He was started on steroids for COPD exacerbation and antibiotics for infection.    His improvement was limited, and so sputum culture was obtained that grew PsA and he was started on cefepime.    Continue to have limited improvement and so Pulmonology were consulted who recommended Lasix.  Improving marginally in the last 24 hours on IV Lasix - Continue IV Lasix - Obtain echocardiogram - If Echo shows reduced RH systolic function, will plan for PO Lasix at dc - Continue cefepime - Continue prednisone with plans for post-discharge taper - Consult Pulmonology, appreciate expertise - Continue Pulmicort and Brovana - Consult oncology, appreciate recommendations   BPH (benign prostatic hyperplasia) History of right-sided ureteral stent He has a chronic right ureteral UPJ stenosis, unclear cause, managed by Dr. Kirtland Bouchard with serial stent exchanges since September 2023.  Due for upcoming stent exchange. - Continue Flomax - Outpatient urology follow up for stent exchange   Adenocarcinoma of  right lung, stage 3 (HCC) Initially diagnosed December 2020, underwent carboplatin paclitaxel with radiation.  Then had radiographic evidence of disease progression in 2022 (no repeat biopsies), underwent carboplatin, Alimta, Keytruda.  Carboplatin stopped due to anaphylaxis.  Keytruda stopped due to concern for pneumonitis.  No radiographic evidence of progression since then.   Class I obesity BMI 31.3  Hyperlipidemia - Continue Lipitor    Chronic pulmonary embolism - Continue Xarelto          Subjective: No significant change.  No new nursing concerns.  Desaturated with ambulation today.     Physical Exam: BP 101/76 (BP Location: Right Arm)   Pulse (!) 114   Temp 97.9 F (36.6 C) (Oral)   Resp (!) 21   Ht 5\' 7"  (1.702 m)   Wt 90.7 kg   SpO2 93%   BMI 31.32 kg/m   Obese adult male, sitting up in bed, appears tired, plethoric Tachycardic, regular, no change in heart rate, no murmurs, trace pitting edema Respiratory rate seems increased and he seems short of breath with talking, lung sounds with some scattered crackles, improving yesterday, no wheezing Abdomen soft, no tenderness palpation Attention normal, affect normal, judgment and insight appear slightly impaired due to chronic hypoxia, but oriented to person, place, time, face symmetric, speech fluent, moves all extremities with normal strength coordination    Data Reviewed: Discussed with pulmonology Basic metabolic panel shows normal creatinine, bicarb slightly up Right blood cell count normal Hemoglobin 11.8, stable    Family Communication: Wife by phone    Disposition: Status is: Inpatient 68 year old male with lung cancer, bronchiectasis, radiation fibrosis, severe COPD, and severe chronic respiratory failure who presented with hypoxia  This is multifactorial, and has not been amenable to treatment so far.  He seems to have made a little bit of improvement with Lasix, so we will continue this,  obtain an echocardiogram.  Home in the next 1 to 2 days        Author: Alberteen Sam, MD 04/27/2023 4:50 PM  For on call review [email or web address].

## 2023-04-27 NOTE — Plan of Care (Signed)
  Problem: Clinical Measurements: Goal: Respiratory complications will improve Outcome: Progressing   Problem: Coping: Goal: Level of anxiety will decrease Outcome: Progressing   Problem: Pain Managment: Goal: General experience of comfort will improve and/or be controlled Outcome: Progressing

## 2023-04-28 ENCOUNTER — Inpatient Hospital Stay (HOSPITAL_COMMUNITY)

## 2023-04-28 DIAGNOSIS — J151 Pneumonia due to Pseudomonas: Secondary | ICD-10-CM | POA: Diagnosis not present

## 2023-04-28 DIAGNOSIS — I272 Pulmonary hypertension, unspecified: Secondary | ICD-10-CM | POA: Diagnosis not present

## 2023-04-28 DIAGNOSIS — J9621 Acute and chronic respiratory failure with hypoxia: Secondary | ICD-10-CM | POA: Diagnosis not present

## 2023-04-28 LAB — COMPREHENSIVE METABOLIC PANEL
ALT: 26 U/L (ref 0–44)
AST: 20 U/L (ref 15–41)
Albumin: 2.7 g/dL — ABNORMAL LOW (ref 3.5–5.0)
Alkaline Phosphatase: 50 U/L (ref 38–126)
Anion gap: 9 (ref 5–15)
BUN: 28 mg/dL — ABNORMAL HIGH (ref 8–23)
CO2: 34 mmol/L — ABNORMAL HIGH (ref 22–32)
Calcium: 8.4 mg/dL — ABNORMAL LOW (ref 8.9–10.3)
Chloride: 97 mmol/L — ABNORMAL LOW (ref 98–111)
Creatinine, Ser: 1.05 mg/dL (ref 0.61–1.24)
GFR, Estimated: >60 mL/min (ref >60)
Glucose, Bld: 113 mg/dL — ABNORMAL HIGH (ref 70–99)
Potassium: 4 mmol/L (ref 3.5–5.1)
Sodium: 140 mmol/L (ref 135–145)
Total Bilirubin: 0.4 mg/dL (ref 0.0–1.2)
Total Protein: 5.5 g/dL — ABNORMAL LOW (ref 6.5–8.1)

## 2023-04-28 LAB — CBC
HCT: 38.3 % — ABNORMAL LOW (ref 39.0–52.0)
Hemoglobin: 11.7 g/dL — ABNORMAL LOW (ref 13.0–17.0)
MCH: 29.3 pg (ref 26.0–34.0)
MCHC: 30.5 g/dL (ref 30.0–36.0)
MCV: 96 fL (ref 80.0–100.0)
Platelets: 226 10*3/uL (ref 150–400)
RBC: 3.99 MIL/uL — ABNORMAL LOW (ref 4.22–5.81)
RDW: 14.9 % (ref 11.5–15.5)
WBC: 10.5 10*3/uL (ref 4.0–10.5)
nRBC: 0 % (ref 0.0–0.2)

## 2023-04-28 LAB — ECHOCARDIOGRAM COMPLETE
AR max vel: 3.14 cm2
AV Area VTI: 3.31 cm2
AV Area mean vel: 3.31 cm2
AV Mean grad: 3 mmHg
AV Peak grad: 4.9 mmHg
Ao pk vel: 1.11 m/s
Area-P 1/2: 5.02 cm2
Height: 67 in
S' Lateral: 3 cm
Weight: 3199.32 [oz_av]

## 2023-04-28 MED ORDER — BISACODYL 10 MG RE SUPP: 10.0000 mg | Freq: Every day | RECTAL | Status: DC | PRN

## 2023-04-28 MED ORDER — FUROSEMIDE 10 MG/ML IJ SOLN
20.0000 mg | Freq: Once | INTRAMUSCULAR | Status: AC
  Administered 2023-04-28: 20 mg via INTRAVENOUS
  Filled 2023-04-28: qty 2

## 2023-04-28 MED ORDER — SODIUM CHLORIDE 3 % IN NEBU
4.0000 mL | INHALATION_SOLUTION | Freq: Every day | RESPIRATORY_TRACT | Status: AC
  Administered 2023-04-28 – 2023-04-30 (×3): 4 mL via RESPIRATORY_TRACT
  Filled 2023-04-28 (×3): qty 4

## 2023-04-28 NOTE — Progress Notes (Signed)
 RT attempted to obtain a AFB sputum sample from patient without success. Post flutter valve usage x 10, the patient has a dry non-productive cough and is unable to produce a sample at this time.

## 2023-04-28 NOTE — Progress Notes (Addendum)
 Progress Note   Patient: Philip [name]:[id number] DOB: [birth year] DOA: 04/18/2023     9 DOS: the patient was seen and examined on 04/28/2023 at 1:30PM      Brief hospital course: 68 y.o. M with NSCLC Stage IIIc, advanced COPD, radiation pneumonitis, hx pneumothorax, bronchiectasis, MAC, and chronic PE on Xarelto and chronic respiratory failure on 6-10L at home and chronic right ureteral stent, who presented with dyspnea.     Started on steroids, Diuresed to hypovolemia, and started treatment for Pseudomonal pneumonia .  Pulmonology following . Suspect near baseline.  Repeat AFB pending   Assessment and Plan: * Acute on chronic hypoxic respiratory failure (HCC) Multifactorial due to COPD with exacerbation, superimposed on underlying radiation-induced lung fibrosis, acute on chronic pulmonary embolism, Pseudomonas pneumonia, lung cancer, and bronchiectasis/MAC   Admitted and CT showed worsening interstitial opacities at baseline.  Ruled out new acute PE.  COVID, flu, RSV negative.  He was started on steroids for COPD exacerbation and antibiotics for infection.   sputum culture was obtained that grew PsA and he was started on cefepime 3/20 Pulmonology consulted and started on IV diuresis, now hypovolemic with unremarkable ECHO.   Will hold additional lasix.  Continues to saturate in the low 90's on 6L [place]. Suspect he is near his baseline Repeat AFB per pulmonology pending   - hold additional lasix. Clinically dry today  -  echocardiogram done 3/25 normal EF and RV function - Continue cefepime started 3/20 - repeat AFB pending.  - Continue prednisone with plans for post-discharge taper -  Pulmonology following, appreciate expertise - Continue Pulmicort and duonebs  - Consult Palliative Care, appreciate recommendations    BPH (benign prostatic hyperplasia) History of right-sided ureteral stent He has a chronic right ureteral UPJ stenosis, unclear cause, managed by Dr. Kirtland Bouchard with  serial stent exchanges since September 2023.  Due for upcoming stent exchange. - Continue Flomax - Outpatient urology follow up for stent exchange   Adenocarcinoma of right lung, stage 3 (HCC) Initially diagnosed December 2020, underwent carboplatin paclitaxel with radiation.  Then had radiographic evidence of disease progression in 2022 (no repeat biopsies), underwent carboplatin, Alimta, Keytruda.  Carboplatin stopped due to anaphylaxis.  Keytruda stopped due to concern for pneumonitis.  No radiographic evidence of progression since then.   Class I obesity BMI 31.3  Hyperlipidemia - Continue Lipitor    Chronic pulmonary embolism - Continue Xarelto  Constipation  Continue bowel regimen.  Abdominal exam benign, tolerating diet   Regular diet No IVF Full code  Xarelto Monitor/replace electrolytes        Subjective:First encounter with patient.   No significant change today. Patient without complaints. Near baseline  Also complaining of constipation. Tolerating diet.     Physical Exam: BP 106/83 (BP Location: Left Arm)   Pulse (!) 108   Temp 98.1 F (36.7 C) (Oral)   Resp 20   Ht 5\' 7"  (1.702 m)   Wt 90.7 kg   SpO2 92%   BMI 31.32 kg/m   Physical Exam Vitals reviewed.  Constitutional:      General: He is not in acute distress.    Appearance: He is not ill-appearing or toxic-appearing.  HENT:     Head: Normocephalic.  Eyes:     Pupils: Pupils are equal, round, and reactive to light.  Cardiovascular:     Rate and Rhythm: Normal rate and regular rhythm.  Pulmonary:     Effort: Pulmonary effort is normal. No respiratory distress.  Abdominal:  General: Bowel sounds are normal.     Palpations: Abdomen is soft.     Comments: Distended, palpable stool in LLQ   Musculoskeletal:     Cervical back: Neck supple.     Right lower leg: No edema.     Left lower leg: No edema.  Skin:    General: Skin is warm and dry.  Neurological:     Mental Status: He is  alert and oriented to person, place, and time.  Psychiatric:        Mood and Affect: Mood normal.        Behavior: Behavior normal.        Data Reviewed:   Labs on Admission: I have personally reviewed following labs and imaging studies  CBC: Recent Labs  Lab 04/24/23 0428 04/25/23 0353 04/26/23 0349 04/27/23 0415 04/28/23 0419  WBC 12.4* 11.0* 11.0* 9.5 10.5  HGB 12.0* 12.2* 11.6* 11.8* 11.7*  HCT 40.9 41.1 38.3* 38.2* 38.3*  MCV 98.6 98.8 97.7 95.0 96.0  PLT 251 216 226 222 226   Basic Metabolic Panel: Recent Labs  Lab 04/24/23 0428 04/25/23 0353 04/26/23 0349 04/27/23 0415 04/28/23 0419  NA 140 137 139 138 140  K 5.3* 4.6 4.5 3.9 4.0  CL 97* 96* 98 94* 97*  CO2 37* 33* 32 35* 34*  GLUCOSE 99 105* 144* 104* 113*  BUN 30* 24* 25* 26* 28*  CREATININE 0.97 0.78 0.85 0.90 1.05  CALCIUM 8.7* 8.2* 8.2* 8.2* 8.4*   GFR: Estimated Creatinine Clearance: 73.3 mL/min (by C-G formula based on SCr of 1.05 mg/dL). Liver Function Tests: Recent Labs  Lab 04/23/23 0340 04/28/23 0419  AST 21 20  ALT 20 26  ALKPHOS 54 50  BILITOT 0.7 0.4  PROT 6.1* 5.5*  ALBUMIN 2.9* 2.7*   No results for input(s): "LIPASE", "AMYLASE" in the last 168 hours. No results for input(s): "AMMONIA" in the last 168 hours. Coagulation Profile: No results for input(s): "INR", "PROTIME" in the last 168 hours. Cardiac Enzymes: No results for input(s): "CKTOTAL", "CKMB", "CKMBINDEX", "TROPONINI" in the last 168 hours. BNP (last 3 results) No results for input(s): "PROBNP" in the last 8760 hours. HbA1C: No results for input(s): "HGBA1C" in the last 72 hours. CBG: No results for input(s): "GLUCAP" in the last 168 hours. Lipid Profile: No results for input(s): "CHOL", "HDL", "LDLCALC", "TRIG", "CHOLHDL", "LDLDIRECT" in the last 72 hours. Thyroid Function Tests: No results for input(s): "TSH", "T4TOTAL", "FREET4", "T3FREE", "THYROIDAB" in the last 72 hours. Anemia Panel: No results for  input(s): "VITAMINB12", "FOLATE", "FERRITIN", "TIBC", "IRON", "RETICCTPCT" in the last 72 hours. Urine analysis:    Component Value Date/Time   COLORURINE YELLOW 04/19/2023 0404   APPEARANCEUR CLOUDY (A) 04/19/2023 0404   LABSPEC 1.028 04/19/2023 0404   PHURINE 6.0 04/19/2023 0404   GLUCOSEU NEGATIVE 04/19/2023 0404   HGBUR LARGE (A) 04/19/2023 0404   BILIRUBINUR NEGATIVE 04/19/2023 0404   KETONESUR NEGATIVE 04/19/2023 0404   PROTEINUR 30 (A) 04/19/2023 0404   NITRITE NEGATIVE 04/19/2023 0404   LEUKOCYTESUR NEGATIVE 04/19/2023 0404    Radiological Exams on Admission: ECHOCARDIOGRAM COMPLETE Result Date: 04/28/2023    ECHOCARDIOGRAM REPORT   Patient Name:   ANIBAL QUINBY Date of Exam: 04/28/2023 Medical Rec #:  [id number]        Height:       67.0 in Accession #:    [id number]       Weight:       200.0 lb Date of Birth:  [birth year]  BSA:          2.022 m Patient Age:    67 years         BP:           106/83 mmHg Patient Gender: M                HR:           105 bpm. Exam Location:  Inpatient Procedure: 2D Echo, Cardiac Doppler and Color Doppler (Both Spectral and Color            Flow Doppler were utilized during procedure). Indications:    Pulmonary Hypertension  History:        Patient has prior history of Echocardiogram examinations, most                 recent 06/17/2019. COPD; Risk Factors:Dyslipidemia and Former                 Smoker.  Sonographer:    Karma Ganja Referring Phys: [id number] CHRISTOPHER P DANFORD IMPRESSIONS  1. Left ventricular ejection fraction, by estimation, is 60 to 65%. The left ventricle has normal function. The left ventricle has no regional wall motion abnormalities. Left ventricular diastolic parameters are consistent with Grade I diastolic dysfunction (impaired relaxation).  2. Right ventricular systolic function is normal. The right ventricular size is normal.  3. Left atrial size was mildly dilated.  4. The mitral valve is normal in structure. No evidence of  mitral valve regurgitation. No evidence of mitral stenosis.  5. The aortic valve is normal in structure. Aortic valve regurgitation is not visualized. No aortic stenosis is present.  6. The inferior vena cava is normal in size with greater than 50% respiratory variability, suggesting right atrial pressure of 3 mmHg. Comparison(s): Prior images reviewed side by side. FINDINGS  Left Ventricle: Left ventricular ejection fraction, by estimation, is 60 to 65%. The left ventricle has normal function. The left ventricle has no regional wall motion abnormalities. The left ventricular internal cavity size was normal in size. There is  no left ventricular hypertrophy. Left ventricular diastolic parameters are consistent with Grade I diastolic dysfunction (impaired relaxation). Right Ventricle: The right ventricular size is normal. No increase in right ventricular wall thickness. Right ventricular systolic function is normal. Left Atrium: Left atrial size was mildly dilated. Right Atrium: Right atrial size was normal in size. Pericardium: There is no evidence of pericardial effusion. Mitral Valve: The mitral valve is normal in structure. No evidence of mitral valve regurgitation. No evidence of mitral valve stenosis. Tricuspid Valve: The tricuspid valve is normal in structure. Tricuspid valve regurgitation is not demonstrated. No evidence of tricuspid stenosis. Aortic Valve: The aortic valve is normal in structure. Aortic valve regurgitation is not visualized. No aortic stenosis is present. Aortic valve mean gradient measures 3.0 mmHg. Aortic valve peak gradient measures 4.9 mmHg. Aortic valve area, by VTI measures 3.31 cm. Pulmonic Valve: The pulmonic valve was normal in structure. Pulmonic valve regurgitation is not visualized. No evidence of pulmonic stenosis. Aorta: The aortic root is normal in size and structure. Venous: The inferior vena cava is normal in size with greater than 50% respiratory variability, suggesting  right atrial pressure of 3 mmHg. IAS/Shunts: No atrial level shunt detected by color flow Doppler.  LEFT VENTRICLE PLAX 2D LVIDd:         4.40 cm   Diastology LVIDs:         3.00 cm   LV e' medial:  6.85 cm/s LV PW:         1.00 cm   LV E/e' medial:  10.0 LV IVS:        0.90 cm   LV e' lateral:   7.62 cm/s LVOT diam:     2.20 cm   LV E/e' lateral: 9.0 LV SV:         51 LV SV Index:   25 LVOT Area:     3.80 cm  RIGHT VENTRICLE RV Basal diam:  3.90 cm LEFT ATRIUM           Index        RIGHT ATRIUM           Index LA diam:      4.30 cm 2.13 cm/m   RA Area:     11.80 cm LA Vol (A2C): 69.0 ml 34.12 ml/m  RA Volume:   22.20 ml  10.98 ml/m LA Vol (A4C): 51.8 ml 25.62 ml/m  AORTIC VALVE AV Area (Vmax):    3.14 cm AV Area (Vmean):   3.31 cm AV Area (VTI):     3.31 cm AV Vmax:           111.00 cm/s AV Vmean:          74.700 cm/s AV VTI:            0.155 m AV Peak Grad:      4.9 mmHg AV Mean Grad:      3.0 mmHg LVOT Vmax:         91.70 cm/s LVOT Vmean:        65.100 cm/s LVOT VTI:          0.135 m LVOT/AV VTI ratio: 0.87  AORTA Ao Root diam: 3.90 cm Ao Asc diam:  2.90 cm MITRAL VALVE MV Area (PHT): 5.02 cm    SHUNTS MV Decel Time: 151 msec    Systemic VTI:  0.14 m MV E velocity: 68.60 cm/s  Systemic Diam: 2.20 cm MV A velocity: 92.50 cm/s MV E/A ratio:  0.74 Donato Schultz MD Electronically signed by Donato Schultz MD Signature Date/Time: 04/28/2023/3:38:51 PM    Final            Disposition: Status is: Inpatient 68 year old male with lung cancer, bronchiectasis, radiation fibrosis, severe COPD, and severe chronic respiratory failure who presented with hypoxia  This is multifactorial, and has not been amenable to treatment so far.  Home in the next 1 to 2 days pending AFB result         Author: Loraine Leriche, DO 04/28/2023 9:00 AM  For on call review [email or web address].

## 2023-04-28 NOTE — Progress Notes (Signed)
 Echocardiogram 2D Echocardiogram has been performed.  Philip [name] [name] 04/28/2023, 2:00 PM

## 2023-04-28 NOTE — Progress Notes (Signed)
 NAME:  Philip [name], MRN:  [id number], DOB:  October 20, 1955, LOS: 9 ADMISSION DATE:  04/18/2023, CONSULTATION DATE:  04/22/2023 REFERRING MD:  Maryfrances Bunnell - [facility] CHIEF COMPLAINT:  Dyspnea   History of Present Illness:  Philip [name] is a 68 y.o. male who has a PMH as below including but not limited to stage III adenocarcinoma of the lung, recurrent non-small cell lung cancer, PE on Xarelto, radiation-induced IPF, COPD, chronic respiratory failure on 4-6 L of oxygen at baseline, pulmonary MAI, bronchiectasis.  He follows Dr. Judeth Horn in the clinic.    He presented to [facility], ED 3/16 with dyspnea, cough, tachycardia.  CTA chest was negative for PE, showed groundglass opacities bilaterally, small right pleural effusion, trace left.  He was admitted by the hospitalist for COPD exacerbation and possible pneumonia (started on ceftriaxone/azithromycin, steroids, bronchodilators).  COVID, flu, RSV all negative.  On 3/20 history and culture returned with Pseudomonas aeruginosa preliminarily, susceptibilities pending.  PCCM subsequently called in consultation for further guidance on antibiotics and additional treatments.  He tells me that he has been coughing up sputum intermittently occasionally greenish in color, other times it is clear and sometimes he does not cough anything up at all.  He gets dyspneic with minimal exertion.  He normally wears 6 L of oxygen but sometimes has to turn it up to 9 of 10.  He denies any chest pain, lightheadedness, fever/chills/sweats.  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  Dr. Judeth Horn recommended against monotherapy with macrolides due to risk of inducible resistance of MAI.  He has been treated with cefepime in the past for Pseudomonas.  Pertinent Medical History:   Past Medical History:  Diagnosis Date   Chronic kidney disease     COPD (chronic obstructive pulmonary disease) (HCC)    Dyspnea    GERD (gastroesophageal reflux disease)    Headache    migraines   Hyperlipidemia    lung ca dx'd 12/2018   On home oxygen therapy    3 to 10 L   Pneumonia    Pulmonary embolus (HCC)    Tobacco abuse    Significant Hospital Events: Including procedures, antibiotic start and stop dates in addition to other pertinent events   3/16 Admit 3/20 PCCM consult 3/25 diuresing and O2 requirement down-trending  3/26 on home 6L, echo with preserved EF and normal R sided function  Interim History/Subjective:  Continues to diurese well, now net negative  On baseline 6L, though he has not ambulated today Echo reassuring   Objective:  Blood pressure 122/87, pulse (!) 110, temperature 98.1 F (36.7 C), temperature source Oral, resp. rate 20, height 5\' 7"  (1.702 m), weight 90.7 kg, SpO2 97%.        Intake/Output Summary (Last 24 hours) at 04/28/2023 1622 Last data filed at 04/28/2023 1500 Gross per 24 hour  Intake 120 ml  Output 2125 ml  Net -2005 ml   Filed Weights   04/19/23 1744  Weight: 90.7 kg   Physical Examination: General: chronically and acutely ill-appearing middle-aged man in NAD. HEENT: [place]/AT, anicteric sclera, PERRL, moist mucous membranes. Neuro: Awake, oriented x 4. Responds to verbal stimuli. Following commands consistently. Moves all 4 extremities spontaneously. CV: RRR, no m/g/r. PULM: no tachypnea or accessory muscle use on 7L [place], scattered lower lung field crackles  GI: Firm but compressible, nontender, moderately distended. Hypoactive bowel sounds. Extremities: Bilateral trace ankle/pedal  edema noted. Skin: Warm/dry, no rashes.  Labs/imaging personally reviewed:   CTA chest 3/16 > no PE, post radiation changes R > L, GGO's bilaterally increased from prior, trace bilateral effusions.  Assessment & Plan:   Pseudomonas PNA Sputum culture from 3/19 preliminarily growing PsA,  pan-sensitive. - Completed 7 day course of cefepime today - BC final, no growth  - Pulmonary hygiene (IS/flutter, chest PT) - No indication for acute treatment of MAI; likely chronic/can be managed in the outpatient setting  AECOPD AoC hypoxic respiratory failure Secondary to above. - Supplemental O2 support for sat 88-92% - Continue bronchodilators - Continue prednisone 40mg , recommend two week taper, seems optimized for discharge tomorrow 3/27 -diuresis prn - Pulmonary hygiene - diuresing well, echo with preserved EF and no RH strain - Monitor I&Os, pt is unsure what his dry weight is    Recurrent NSCLC - s/p chemoradiation and immunotherapy 2020 and 2022, currently on observation. Radiation induced IPF Hx pulmonary MAI Chronic PE on Xarelto - Continue Xarelto - PMT following - Avoid monotherapy with macrolides given risk of resistance - Outpatient pulmonary follow up  PCCM will continue to follow with you.  Best practice (evaluated daily):  Per Primary Team  Signature:   Darcella Gasman Erika Slaby, [name] [facility] 04/28/23 4:22 PM  Please see [email or web address] for pager details.  From 7A-7P if no response, please call [phone] After hours, please call ELink [phone]

## 2023-04-29 ENCOUNTER — Other Ambulatory Visit (HOSPITAL_COMMUNITY): Payer: Self-pay

## 2023-04-29 DIAGNOSIS — J9621 Acute and chronic respiratory failure with hypoxia: Secondary | ICD-10-CM | POA: Diagnosis not present

## 2023-04-29 DIAGNOSIS — J151 Pneumonia due to Pseudomonas: Secondary | ICD-10-CM | POA: Diagnosis not present

## 2023-04-29 LAB — CBC
HCT: 36.4 % — ABNORMAL LOW (ref 39.0–52.0)
Hemoglobin: 11.3 g/dL — ABNORMAL LOW (ref 13.0–17.0)
MCH: 29.7 pg (ref 26.0–34.0)
MCHC: 31 g/dL (ref 30.0–36.0)
MCV: 95.5 fL (ref 80.0–100.0)
Platelets: 212 10*3/uL (ref 150–400)
RBC: 3.81 MIL/uL — ABNORMAL LOW (ref 4.22–5.81)
RDW: 14.6 % (ref 11.5–15.5)
WBC: 11.3 10*3/uL — ABNORMAL HIGH (ref 4.0–10.5)
nRBC: 0 % (ref 0.0–0.2)

## 2023-04-29 LAB — BASIC METABOLIC PANEL WITH GFR
Anion gap: 8 (ref 5–15)
BUN: 28 mg/dL — ABNORMAL HIGH (ref 8–23)
CO2: 31 mmol/L (ref 22–32)
Calcium: 8.5 mg/dL — ABNORMAL LOW (ref 8.9–10.3)
Chloride: 98 mmol/L (ref 98–111)
Creatinine, Ser: 0.86 mg/dL (ref 0.61–1.24)
GFR, Estimated: >60 mL/min (ref >60)
Glucose, Bld: 100 mg/dL — ABNORMAL HIGH (ref 70–99)
Potassium: 4.1 mmol/L (ref 3.5–5.1)
Sodium: 137 mmol/L (ref 135–145)

## 2023-04-29 LAB — ACID FAST SMEAR (AFB, MYCOBACTERIA): Acid Fast Smear: NEGATIVE

## 2023-04-29 NOTE — Progress Notes (Signed)
 Progress Note   Patient: Philip [name] IEP:[id number] DOB: 1956-01-18 DOA: 04/18/2023     10 DOS: the patient was seen and examined on 04/29/2023 at 1:30PM      Brief hospital course: 68 y.o. M with NSCLC Stage IIIc, advanced COPD, radiation pneumonitis, hx pneumothorax, bronchiectasis, MAC, and chronic PE on Xarelto and chronic respiratory failure on 6-10L at home and chronic right ureteral stent, who presented with dyspnea.     Started on steroids, Diuresed to hypovolemia, and started treatment for Pseudomonal pneumonia .  Pulmonology following . Suspect near baseline.  Repeat AFB pending   Assessment and Plan: * Acute on chronic hypoxic respiratory failure (HCC) Multifactorial due to COPD with exacerbation, superimposed on underlying radiation-induced lung fibrosis, acute on chronic pulmonary embolism, Pseudomonas pneumonia, lung cancer, and bronchiectasis/MAC   Admitted and CT showed worsening interstitial opacities at baseline.  Ruled out new acute PE.  COVID, flu, RSV negative.  He was started on steroids for COPD exacerbation and antibiotics for infection.   sputum culture was obtained that grew PsA and he was started on cefepime 3/20 Pulmonology consulted and started on IV diuresis, now hypovolemic with unremarkable ECHO.  Continues to saturate in the low 90's on 6L [place]. Suspect he is near his baseline Repeat AFB pending   - hold additional lasix. Net -9L this admission.  Clinically dry today  -  echocardiogram done 3/25 normal EF and RV function - Continue cefepime started 3/20 - repeat AFB pending. Can be followed up outpatient  - Continue prednisone with plans for post-discharge taper -  Pulmonology following, appreciate expertise - Continue Pulmicort and duonebs  - Consult Palliative Care, appreciate recommendations  -- Follow up with Pulmonology outpatient to see if he is candidate for MAC treatment    BPH (benign prostatic hyperplasia) History of right-sided  ureteral stent He has a chronic right ureteral UPJ stenosis, unclear cause, managed by Dr. Kirtland Bouchard with serial stent exchanges since September 2023.  Due for upcoming stent exchange. - Continue Flomax - Outpatient urology follow up for stent exchange   Adenocarcinoma of right lung, stage 3 (HCC) Initially diagnosed December 2020, underwent carboplatin paclitaxel with radiation.  Then had radiographic evidence of disease progression in 2022 (no repeat biopsies), underwent carboplatin, Alimta, Keytruda.  Carboplatin stopped due to anaphylaxis.  Keytruda stopped due to concern for pneumonitis.  No radiographic evidence of progression since then.   Class I obesity BMI 31.3  Hyperlipidemia - Continue Lipitor    Chronic pulmonary embolism - Continue Xarelto  Constipation  Continue bowel regimen.  Abdominal exam benign, tolerating diet   Regular diet No IVF Full code  Xarelto Monitor/replace electrolytes        Subjective:  Overnight desaturated into the 70's when O2 accidentally came off, requiring 8L HFNC to recover saturations. Today back to 6L   Does not feel ready for discharge today given overnight events.  Has not yet had a BM.    Physical Exam: BP (!) 141/89 (BP Location: Left Arm)   Pulse (!) 103   Temp 97.8 F (36.6 C) (Oral)   Resp 20   Ht 5\' 7"  (1.702 m)   Wt 90.7 kg   SpO2 95%   BMI 31.32 kg/m   Physical Exam Vitals reviewed.  Constitutional:      General: He is not in acute distress.    Appearance: He is not ill-appearing or toxic-appearing.  HENT:     Head: Normocephalic.  Eyes:     Pupils: Pupils  are equal, round, and reactive to light.  Cardiovascular:     Rate and Rhythm: Normal rate and regular rhythm.  Pulmonary:     Effort: Pulmonary effort is normal. No respiratory distress.  Abdominal:     General: Bowel sounds are normal.     Palpations: Abdomen is soft.     Comments: Distended, palpable stool in LLQ   Musculoskeletal:     Cervical  back: Neck supple.     Right lower leg: No edema.     Left lower leg: No edema.  Skin:    General: Skin is warm and dry.  Neurological:     Mental Status: He is alert and oriented to person, place, and time.  Psychiatric:        Mood and Affect: Mood normal.        Behavior: Behavior normal.        Data Reviewed:   Labs on Admission: I have personally reviewed following labs and imaging studies  CBC: Recent Labs  Lab 04/25/23 0353 04/26/23 0349 04/27/23 0415 04/28/23 0419 04/29/23 0407  WBC 11.0* 11.0* 9.5 10.5 11.3*  HGB 12.2* 11.6* 11.8* 11.7* 11.3*  HCT 41.1 38.3* 38.2* 38.3* 36.4*  MCV 98.8 97.7 95.0 96.0 95.5  PLT 216 226 222 226 212   Basic Metabolic Panel: Recent Labs  Lab 04/25/23 0353 04/26/23 0349 04/27/23 0415 04/28/23 0419 04/29/23 0407  NA 137 139 138 140 137  K 4.6 4.5 3.9 4.0 4.1  CL 96* 98 94* 97* 98  CO2 33* 32 35* 34* 31  GLUCOSE 105* 144* 104* 113* 100*  BUN 24* 25* 26* 28* 28*  CREATININE 0.78 0.85 0.90 1.05 0.86  CALCIUM 8.2* 8.2* 8.2* 8.4* 8.5*   GFR: Estimated Creatinine Clearance: 89.5 mL/min (by C-G formula based on SCr of 0.86 mg/dL). Liver Function Tests: Recent Labs  Lab 04/23/23 0340 04/28/23 0419  AST 21 20  ALT 20 26  ALKPHOS 54 50  BILITOT 0.7 0.4  PROT 6.1* 5.5*  ALBUMIN 2.9* 2.7*   No results for input(s): "LIPASE", "AMYLASE" in the last 168 hours. No results for input(s): "AMMONIA" in the last 168 hours. Coagulation Profile: No results for input(s): "INR", "PROTIME" in the last 168 hours. Cardiac Enzymes: No results for input(s): "CKTOTAL", "CKMB", "CKMBINDEX", "TROPONINI" in the last 168 hours. BNP (last 3 results) No results for input(s): "PROBNP" in the last 8760 hours. HbA1C: No results for input(s): "HGBA1C" in the last 72 hours. CBG: No results for input(s): "GLUCAP" in the last 168 hours. Lipid Profile: No results for input(s): "CHOL", "HDL", "LDLCALC", "TRIG", "CHOLHDL", "LDLDIRECT" in the last  72 hours. Thyroid Function Tests: No results for input(s): "TSH", "T4TOTAL", "FREET4", "T3FREE", "THYROIDAB" in the last 72 hours. Anemia Panel: No results for input(s): "VITAMINB12", "FOLATE", "FERRITIN", "TIBC", "IRON", "RETICCTPCT" in the last 72 hours. Urine analysis:    Component Value Date/Time   COLORURINE YELLOW 04/19/2023 0404   APPEARANCEUR CLOUDY (A) 04/19/2023 0404   LABSPEC 1.028 04/19/2023 0404   PHURINE 6.0 04/19/2023 0404   GLUCOSEU NEGATIVE 04/19/2023 0404   HGBUR LARGE (A) 04/19/2023 0404   BILIRUBINUR NEGATIVE 04/19/2023 0404   KETONESUR NEGATIVE 04/19/2023 0404   PROTEINUR 30 (A) 04/19/2023 0404   NITRITE NEGATIVE 04/19/2023 0404   LEUKOCYTESUR NEGATIVE 04/19/2023 0404    Radiological Exams on Admission: ECHOCARDIOGRAM COMPLETE Result Date: 04/28/2023    ECHOCARDIOGRAM REPORT   Patient Name:   JERRED ZAREMBA Date of Exam: 04/28/2023 Medical Rec #:  [id number]  Height:       67.0 in Accession #:    [id number]       Weight:       200.0 lb Date of Birth:  March 03, 1955        BSA:          2.022 m Patient Age:    67 years         BP:           106/83 mmHg Patient Gender: M                HR:           105 bpm. Exam Location:  Inpatient Procedure: 2D Echo, Cardiac Doppler and Color Doppler (Both Spectral and Color            Flow Doppler were utilized during procedure). Indications:    Pulmonary Hypertension  History:        Patient has prior history of Echocardiogram examinations, most                 recent 06/17/2019. COPD; Risk Factors:Dyslipidemia and Former                 Smoker.  Sonographer:    Karma Ganja Referring Phys: [id number] CHRISTOPHER P DANFORD IMPRESSIONS  1. Left ventricular ejection fraction, by estimation, is 60 to 65%. The left ventricle has normal function. The left ventricle has no regional wall motion abnormalities. Left ventricular diastolic parameters are consistent with Grade I diastolic dysfunction (impaired relaxation).  2. Right ventricular  systolic function is normal. The right ventricular size is normal.  3. Left atrial size was mildly dilated.  4. The mitral valve is normal in structure. No evidence of mitral valve regurgitation. No evidence of mitral stenosis.  5. The aortic valve is normal in structure. Aortic valve regurgitation is not visualized. No aortic stenosis is present.  6. The inferior vena cava is normal in size with greater than 50% respiratory variability, suggesting right atrial pressure of 3 mmHg. Comparison(s): Prior images reviewed side by side. FINDINGS  Left Ventricle: Left ventricular ejection fraction, by estimation, is 60 to 65%. The left ventricle has normal function. The left ventricle has no regional wall motion abnormalities. The left ventricular internal cavity size was normal in size. There is  no left ventricular hypertrophy. Left ventricular diastolic parameters are consistent with Grade I diastolic dysfunction (impaired relaxation). Right Ventricle: The right ventricular size is normal. No increase in right ventricular wall thickness. Right ventricular systolic function is normal. Left Atrium: Left atrial size was mildly dilated. Right Atrium: Right atrial size was normal in size. Pericardium: There is no evidence of pericardial effusion. Mitral Valve: The mitral valve is normal in structure. No evidence of mitral valve regurgitation. No evidence of mitral valve stenosis. Tricuspid Valve: The tricuspid valve is normal in structure. Tricuspid valve regurgitation is not demonstrated. No evidence of tricuspid stenosis. Aortic Valve: The aortic valve is normal in structure. Aortic valve regurgitation is not visualized. No aortic stenosis is present. Aortic valve mean gradient measures 3.0 mmHg. Aortic valve peak gradient measures 4.9 mmHg. Aortic valve area, by VTI measures 3.31 cm. Pulmonic Valve: The pulmonic valve was normal in structure. Pulmonic valve regurgitation is not visualized. No evidence of pulmonic  stenosis. Aorta: The aortic root is normal in size and structure. Venous: The inferior vena cava is normal in size with greater than 50% respiratory variability, suggesting right atrial pressure of 3 mmHg. IAS/Shunts: No atrial  level shunt detected by color flow Doppler.  LEFT VENTRICLE PLAX 2D LVIDd:         4.40 cm   Diastology LVIDs:         3.00 cm   LV e' medial:    6.85 cm/s LV PW:         1.00 cm   LV E/e' medial:  10.0 LV IVS:        0.90 cm   LV e' lateral:   7.62 cm/s LVOT diam:     2.20 cm   LV E/e' lateral: 9.0 LV SV:         51 LV SV Index:   25 LVOT Area:     3.80 cm  RIGHT VENTRICLE RV Basal diam:  3.90 cm LEFT ATRIUM           Index        RIGHT ATRIUM           Index LA diam:      4.30 cm 2.13 cm/m   RA Area:     11.80 cm LA Vol (A2C): 69.0 ml 34.12 ml/m  RA Volume:   22.20 ml  10.98 ml/m LA Vol (A4C): 51.8 ml 25.62 ml/m  AORTIC VALVE AV Area (Vmax):    3.14 cm AV Area (Vmean):   3.31 cm AV Area (VTI):     3.31 cm AV Vmax:           111.00 cm/s AV Vmean:          74.700 cm/s AV VTI:            0.155 m AV Peak Grad:      4.9 mmHg AV Mean Grad:      3.0 mmHg LVOT Vmax:         91.70 cm/s LVOT Vmean:        65.100 cm/s LVOT VTI:          0.135 m LVOT/AV VTI ratio: 0.87  AORTA Ao Root diam: 3.90 cm Ao Asc diam:  2.90 cm MITRAL VALVE MV Area (PHT): 5.02 cm    SHUNTS MV Decel Time: 151 msec    Systemic VTI:  0.14 m MV E velocity: 68.60 cm/s  Systemic Diam: 2.20 cm MV A velocity: 92.50 cm/s MV E/A ratio:  0.74 Donato Schultz MD Electronically signed by Donato Schultz MD Signature Date/Time: 04/28/2023/3:38:51 PM    Final            Disposition: Status is: Inpatient 68 year old male with lung cancer, bronchiectasis, radiation fibrosis, severe COPD, and severe chronic respiratory failure who presented with hypoxia  This is multifactorial, and has not been amenable to treatment so far.  Home in the next 1 to 2 days pending AFB result         Author: Loraine Leriche, DO 04/29/2023  4:24 PM  For on call review [email or web address].

## 2023-04-29 NOTE — Progress Notes (Signed)
 NAME:  Philip [name], MRN:  [id number], DOB:  [birth year], LOS: 10 ADMISSION DATE:  04/18/2023, CONSULTATION DATE:  04/22/2023 REFERRING MD:  Maryfrances Bunnell - [facility] CHIEF COMPLAINT:  Dyspnea   History of Present Illness:  Philip [name] is a 68 y.o. male who has a PMH as below including but not limited to stage III adenocarcinoma of the lung, recurrent non-small cell lung cancer, PE on Xarelto, radiation-induced IPF, COPD, chronic respiratory failure on 4-6 L of oxygen at baseline, pulmonary MAI, bronchiectasis.  He follows Dr. Judeth Horn in the clinic.    He presented to [facility], ED 3/16 with dyspnea, cough, tachycardia.  CTA chest was negative for PE, showed groundglass opacities bilaterally, small right pleural effusion, trace left.  He was admitted by the hospitalist for COPD exacerbation and possible pneumonia (started on ceftriaxone/azithromycin, steroids, bronchodilators).  COVID, flu, RSV all negative.  On 3/20 history and culture returned with Pseudomonas aeruginosa preliminarily, susceptibilities pending.  PCCM subsequently called in consultation for further guidance on antibiotics and additional treatments.  He tells me that he has been coughing up sputum intermittently occasionally greenish in color, other times it is clear and sometimes he does not cough anything up at all.  He gets dyspneic with minimal exertion.  He normally wears 6 L of oxygen but sometimes has to turn it up to 9 of 10.  He denies any chest pain, lightheadedness, fever/chills/sweats.  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  Dr. Judeth Horn recommended against monotherapy with macrolides due to risk of inducible resistance of MAI.  He has been treated with cefepime in the past for Pseudomonas.  Pertinent Medical History:   Past Medical History:  Diagnosis Date   Chronic kidney disease     COPD (chronic obstructive pulmonary disease) (HCC)    Dyspnea    GERD (gastroesophageal reflux disease)    Headache    migraines   Hyperlipidemia    lung ca dx'd 12/2018   On home oxygen therapy    3 to 10 L   Pneumonia    Pulmonary embolus (HCC)    Tobacco abuse    Significant Hospital Events: Including procedures, antibiotic start and stop dates in addition to other pertinent events   3/16 Admit 3/20 PCCM consult 3/25 diuresing and O2 requirement down-trending  3/26 on home 6L, echo with preserved EF and normal R sided function  Interim History/Subjective:  No acute events, remains on 6L plan for discharge today 2L UOP yesterday, negative 9L since admission   Objective:  Blood pressure (!) 141/89, pulse (!) 103, temperature 97.8 F (36.6 C), temperature source Oral, resp. rate 20, height 5\' 7"  (1.702 m), weight 90.7 kg, SpO2 94%.        Intake/Output Summary (Last 24 hours) at 04/29/2023 1232 Last data filed at 04/29/2023 1191 Gross per 24 hour  Intake 120 ml  Output 2275 ml  Net -2155 ml   Filed Weights   04/19/23 1744  Weight: 90.7 kg   Physical Examination: General: chronically and acutely ill-appearing middle-aged man in NAD. HEENT: [place]/AT, anicteric sclera, PERRL, moist mucous membranes. Neuro: Awake, oriented x 4. Responds to verbal stimuli. Following commands consistently. Moves all 4 extremities spontaneously. CV: RRR, no m/g/r. PULM: no tachypnea or accessory muscle use on 6L [place], scattered lower lung field crackles bilaterally, desaturates with exertion GI: Firm but compressible, nontender, moderately distended. Hypoactive bowel sounds. Extremities: no  edema Skin: Warm/dry, no rashes.  Labs/imaging personally reviewed:   CTA chest 3/16 > no PE, post radiation changes R > L, GGO's bilaterally increased from prior, trace bilateral effusions.  Assessment & Plan:   Pseudomonas PNA Sputum culture from 3/19 preliminarily growing PsA,  pan-sensitive. - Completed 7 day course of cefepime today - BC final, no growth  - Pulmonary hygiene (IS/flutter, chest PT) - No indication for acute treatment of MAI; likely chronic/can be managed in the outpatient setting, repeat AFB sent 3/26 -Appt with Dr. Judeth Horn 3/2 9am  AECOPD AoC hypoxic respiratory failure Secondary to above. - Supplemental O2 support for sat 88-92% - Continue bronchodilators - Continue prednisone 40mg , recommend two week taper, plan for discharge today -diuresis prn - Pulmonary hygiene - diuresing well, echo with preserved EF and no RH strain - Monitor I&Os, pt is unsure what his dry weight is    Recurrent NSCLC - s/p chemoradiation and immunotherapy 2020 and 2022, currently on observation. Radiation induced IPF Hx pulmonary MAI Chronic PE on Xarelto - Continue Xarelto - PMT following - Avoid monotherapy with macrolides given risk of resistance - Outpatient pulmonary follow up scheduled    Best practice (evaluated daily):  Per Primary Team  Signature:   Darcella Gasman Amazing Cowman, [name] [facility] 04/29/23 12:32 PM  Please see [email or web address] for pager details.  From 7A-7P if no response, please call [phone] After hours, please call ELink [phone]

## 2023-04-29 NOTE — Progress Notes (Signed)
 Mobility Specialist - Progress Note   04/29/23 1131  Oxygen Therapy  O2 Device Nasal Cannula  O2 Flow Rate (L/min) 6 L/min  Mobility  Activity Dangled on edge of bed  Level of Assistance Modified independent, requires aide device or extra time  Activity Response Tolerated well  Mobility Referral Yes  Mobility visit 1 Mobility  Mobility Specialist Start Time (ACUTE ONLY) 1114  Mobility Specialist Stop Time (ACUTE ONLY) 1131  Mobility Specialist Time Calculation (min) (ACUTE ONLY) 17 min   Pt received in bed and agreeable to mobility. Once sitting up, pt desat to 82%. Unable to do any further activity d/t constant drop in SpO2. Able to come back up to 91% after ~94min. Pt tolerated sitting EOB for ~60min. No complaints during session. Pt to bed after session with all needs met.   [facility]

## 2023-04-29 NOTE — Progress Notes (Signed)
 Patient had an episode where patient's SpO2 dropped down to the 60's% per patient. Patient was found sitting on the edge of the bed having shortness of breath and SpO2 hanging around the 70's%. RN had to bump patient's O2 up to 8 liters to get patient's SpO2 to 95%. RN was able to slowly titrate patient's O2 back down to 6 liters with SpO2 at 93%. Patient was encouraged to take some slow, deep breaths during this time.

## 2023-04-30 DIAGNOSIS — J9621 Acute and chronic respiratory failure with hypoxia: Secondary | ICD-10-CM | POA: Diagnosis not present

## 2023-04-30 LAB — BASIC METABOLIC PANEL WITH GFR
Anion gap: 11 (ref 5–15)
BUN: 27 mg/dL — ABNORMAL HIGH (ref 8–23)
CO2: 29 mmol/L (ref 22–32)
Calcium: 8.8 mg/dL — ABNORMAL LOW (ref 8.9–10.3)
Chloride: 98 mmol/L (ref 98–111)
Creatinine, Ser: 0.74 mg/dL (ref 0.61–1.24)
GFR, Estimated: >60 mL/min (ref >60)
Glucose, Bld: 122 mg/dL — ABNORMAL HIGH (ref 70–99)
Potassium: 4.3 mmol/L (ref 3.5–5.1)
Sodium: 138 mmol/L (ref 135–145)

## 2023-04-30 LAB — CBC
HCT: 40.2 % (ref 39.0–52.0)
Hemoglobin: 12.1 g/dL — ABNORMAL LOW (ref 13.0–17.0)
MCH: 29.3 pg (ref 26.0–34.0)
MCHC: 30.1 g/dL (ref 30.0–36.0)
MCV: 97.3 fL (ref 80.0–100.0)
Platelets: 219 10*3/uL (ref 150–400)
RBC: 4.13 MIL/uL — ABNORMAL LOW (ref 4.22–5.81)
RDW: 15.1 % (ref 11.5–15.5)
WBC: 13.9 10*3/uL — ABNORMAL HIGH (ref 4.0–10.5)
nRBC: 0 % (ref 0.0–0.2)

## 2023-04-30 MED ORDER — OXYCODONE HCL 5 MG/5ML PO SOLN
1.5000 mg | ORAL | Status: DC | PRN
  Administered 2023-04-30 – 2023-05-04 (×11): 1.5 mg via ORAL
  Filled 2023-04-30 (×11): qty 5

## 2023-04-30 MED ORDER — SENNOSIDES-DOCUSATE SODIUM 8.6-50 MG PO TABS
1.0000 | ORAL_TABLET | Freq: Every evening | ORAL | Status: DC | PRN
  Administered 2023-04-30: 1 via ORAL

## 2023-04-30 MED ORDER — METOPROLOL TARTRATE 5 MG/5ML IV SOLN: 5.0000 mg | INTRAVENOUS | Status: DC | PRN

## 2023-04-30 MED ORDER — HYDRALAZINE HCL 20 MG/ML IJ SOLN: 10.0000 mg | INTRAMUSCULAR | Status: DC | PRN

## 2023-04-30 NOTE — Evaluation (Signed)
 Physical Therapy Evaluation Patient Details Name: Philip [name] MRN: [id number] DOB: 02/18/1955 Today's Date: 04/30/2023  History of Present Illness  Pt is 68 yo male admitted on 04/18/23 with acute on chronic resp failure in setting of pseudomonas PNE , COPD exacerbation with possibly underlying worsening of radiation-induced fibrosis.  Pt with hx including but not limited to stage III adenocarcinoma of lung, PE on Xarelto, radiation-induced IPF, COPD, chronic hypoxia on 4-6 L nasal cannula, pulmonary MAI, bronchiectasis  Clinical Impression  Pt admitted with above diagnosis. At baseline, pt ambulatory without AD.  He is on 6 L O2 at baseline.  Pt motivated to improve his mobility and endurance.  Today, pt able to ambulate 25' x 2 but requiring increased time for recovery, rest breaks, and 8 L HFNC.  Pt currently with functional limitations due to the deficits listed below (see PT Problem List). Pt will benefit from acute skilled PT to increase their independence and safety with mobility to allow discharge.  Pt interested in rehab at d/c. Patient will benefit from intensive inpatient follow-up therapy, >3 hours/day   Pt on 7 L HFNC rest with sats 89-91% Pt ambulated 8 L HFNC with sats 81% taking 3 mins to recover         If plan is discharge home, recommend the following: Assistance with cooking/housework;A little help with bathing/dressing/bathroom;A little help with walking and/or transfers;Help with stairs or ramp for entrance   Can travel by private vehicle        Equipment Recommendations Other (comment) (potential rollator to allow for rest breaks if pt wants)  Recommendations for Other Services  Rehab consult    Functional Status Assessment Patient has had a recent decline in their functional status and demonstrates the ability to make significant improvements in function in a reasonable and predictable amount of time.     Precautions / Restrictions Precautions Precautions:  Fall Precaution/Restrictions Comments: watch sats      Mobility  Bed Mobility Overal bed mobility: Needs Assistance Bed Mobility: Supine to Sit, Sit to Supine     Supine to sit: Supervision Sit to supine: Supervision        Transfers Overall transfer level: Needs assistance Equipment used: None Transfers: Sit to/from Stand Sit to Stand: Contact guard assist                Ambulation/Gait Ambulation/Gait assistance: Contact guard assist Gait Distance (Feet): 25 Feet (ambulated 25'x2) Assistive device:  (O2 tank) Gait Pattern/deviations: Step-through pattern Gait velocity: decreased     General Gait Details: Ambulated out in hall 25' and then stood for recovery 3 mins then back 25' into room . CGA for safety  Stairs            Wheelchair Mobility     Tilt Bed    Modified Rankin (Stroke Patients Only)       Balance Overall balance assessment: Needs assistance Sitting-balance support: No upper extremity supported Sitting balance-Leahy Scale: Good     Standing balance support: No upper extremity supported Standing balance-Leahy Scale: Good                               Pertinent Vitals/Pain Pain Assessment Pain Assessment: No/denies pain    Home Living Family/patient expects to be discharged to:: Private residence Living Arrangements: Spouse/significant other Available Help at Discharge: Family;Available 24 hours/day Type of Home: House Home Access: Stairs to enter Entrance Stairs-Rails: None Entrance [employer]  of Steps: 2   Home Layout: One level Home Equipment: Shower seat;Grab bars - tub/shower Additional Comments: On 6 L O2 at home    Prior Function Prior Level of Function : Needs assist             Mobility Comments: Pt ambulatory in home and short community distances - pushes his O2 tank; limited by cardioresp endurance.  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, teach classes, crawl up on  scaffolding but then was hospitalized and never recovered his endurance and on 6 L O2 since ADLs Comments: independent wtih adls; assist with iadls     Extremity/Trunk Assessment   Upper Extremity Assessment Upper Extremity Assessment: Overall WFL for tasks assessed    Lower Extremity Assessment Lower Extremity Assessment: LLE deficits/detail;RLE deficits/detail RLE Deficits / Details: ROM WFL; MMT 5/5 but fatigues LLE Deficits / Details: ROM WFL; MMT 5/5 but fatigues    Cervical / Trunk Assessment Cervical / Trunk Assessment: Normal  Communication        Cognition Arousal: Alert Behavior During Therapy: WFL for tasks assessed/performed   PT - Cognitive impairments: No apparent impairments                                 Cueing       General Comments General comments (skin integrity, edema, etc.): Pt on 7 L HFNC rest with sats 89-91% even with sitting and talking on EOB.  With ambulation 8 L (7 not option) sats dropped to 81% and required 3 mins to recover.    Exercises     Assessment/Plan    PT Assessment Patient needs continued PT services  PT Problem List Decreased strength;Decreased range of motion;Decreased activity tolerance;Decreased mobility;Decreased balance;Decreased knowledge of use of DME;Cardiopulmonary status limiting activity       PT Treatment Interventions DME instruction;Therapeutic exercise;Gait training;Functional mobility training;Therapeutic activities;Patient/family education;Stair training    PT Goals (Current goals can be found in the Care Plan section)  Acute Rehab PT Goals Patient Stated Goal: improve breathing PT Goal Formulation: With patient Time For Goal Achievement: 05/14/23 Potential to Achieve Goals: Good    Frequency Min 2X/week     Co-evaluation               AM-PAC PT "6 Clicks" Mobility  Outcome Measure Help needed turning from your back to your side while in a flat bed without using bedrails?:  None Help needed moving from lying on your back to sitting on the side of a flat bed without using bedrails?: None Help needed moving to and from a bed to a chair (including a wheelchair)?: A Little Help needed standing up from a chair using your arms (e.g., wheelchair or bedside chair)?: A Little Help needed to walk in hospital room?: A Little Help needed climbing 3-5 steps with a railing? : A Little 6 Click Score: 20    End of Session Equipment Utilized During Treatment: Oxygen Activity Tolerance: Patient tolerated treatment well Patient left: in bed;with call bell/phone within reach (sitting to eat) Nurse Communication: Mobility status PT Visit Diagnosis: Other abnormalities of gait and mobility (R26.89);Muscle weakness (generalized) (M62.81)    Time: 1423-1500 PT Time Calculation (min) (ACUTE ONLY): 37 min   Charges:   PT Evaluation $PT Eval Low Complexity: 1 Low PT Treatments $Gait Training: 8-22 mins PT General Charges $$ ACUTE PT VISIT: 1 Visit  Anise Salvo, PT Acute Rehab [facility] [phone]   Rayetta Humphrey 04/30/2023, 3:28 PM

## 2023-04-30 NOTE — Progress Notes (Signed)
   D/w [facility] MD Dr Nelson Chimes  [facility] can sign off  Has Future Appointments  Date Time Provider Department Center  05/05/2023  9:00 AM Hunsucker, Lesia Sago, MD [facility] None  05/25/2023  3:00 PM [facility] PALLIATIVE CARE [facility] None  06/29/2023  9:00 AM [facility] LAB [facility] None  07/06/2023 10:30 AM Si Gaul, MD [facility] None       SIGNATURE    Dr. Kalman Shan, M.D., F.C.C.P,  Pulmonary and Critical Care Medicine Staff Physician, [facility] Center Director - Interstitial Lung Disease  Program  Pulmonary Fibrosis [facility] at [facility] [place], [place], [address]   Pager: [phone], If no answer  -> Check AMION or Try [phone] Telephone (clinical office): [phone] Telephone (research): [phone]  11:13 AM 04/30/2023

## 2023-04-30 NOTE — Progress Notes (Signed)
 PROGRESS NOTE    Philip [name]  GNF:[id number] DOB: 1955-05-13 DOA: 04/18/2023 PCP: Farris Has, MD    Brief Narrative:   68 year old with history of stage III adenocarcinoma of lung, PE on Xarelto, radiation-induced IPF, COPD, chronic hypoxia on 4-6 L nasal cannula, pulmonary MAI, bronchiectasis initially presented to the hospital on 3/16 for dyspnea, cough and tachycardia.  CTA of the chest was negative for PE but showed bilateral groundglass opacities and admitted to [facility] service.  Initial treatment was initiated for COPD exacerbation along with CAP on steroids, bronchodilators and Rocephin/azithromycin.  COVID, flu and RSV were negative.  Eventually cultures on 3/20 resulted as Pseudomonas therefore pulmonary was consulted and antibiotics were changed to cefepime.  Patient was also transitioned off IV steroids to p.o. prednisone.  Echocardiogram showed preserved EF.   Assessment & Plan:  Principal Problem:   Acute on chronic hypoxic respiratory failure (HCC) Active Problems:   COPD (chronic obstructive pulmonary disease) (HCC)   Adenocarcinoma of right lung, stage 3 (HCC)   Chronic pulmonary embolism   BPH (benign prostatic hyperplasia)   History of lung cancer   Hyperlipidemia   Class I obesity   Acute respiratory distress Acute on chronic hypoxia -This is multifactorial in nature in the setting of Pseudomonas pneumonia and COPD exacerbation with possibly underlying worsening of radiation-induced fibrosis.  Pseudomonas pneumonia - Initially treated with Rocephin and azithromycin and eventually transition to 7 days of cefepime which patient has completed.  Acute COPD exacerbation - Acute on chronic hypoxia - At baseline uses 4 to 6 L of nasal cannula.  Initially started on IV Solu-Medrol now has been transitioned to prednisone.  Plan would be prolonged taper over next 2 weeks.  Echocardiogram shows preserved EF and has responded well to as needed diuresis.  History of  recurrent non-small cell lung cancer - Previously received chemotherapy and immunotherapy back in 2020 and 2022.  Followed by outpatient pulmonary team.  History of radiation induced IPF - Already on bronchodilators and steroids  History of pulmonary MAI Chronic pulmonary embolism - Patient currently is on Xarelto.  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  For now continue Flomax    DVT prophylaxis: Xarelto    Code Status: Full Code Family Communication:   Status is: Inpatient Remains inpatient appropriate because: Still quite weak, PT/OT.    Subjective:  Generally feels quite weak and deconditioned.  Agreeable for PT/OT  Examination:  General exam: Appears calm and comfortable  Respiratory system: Clear to auscultation. Respiratory effort normal. Cardiovascular system: S1 & S2 heard, RRR. No JVD, murmurs, rubs, gallops or clicks. No pedal edema. Gastrointestinal system: Abdomen is nondistended, soft and nontender. No organomegaly or masses felt. Normal bowel sounds heard. Central nervous system: Alert and oriented. No focal neurological deficits. Extremities: Symmetric 5 x 5 power. Skin: No rashes, lesions or ulcers Psychiatry: Judgement and insight appear normal. Mood & affect appropriate.                Diet Orders (From admission, onward)     Start     Ordered   04/19/23 0910  Diet regular Fluid consistency: Thin  Diet effective now       Question:  Fluid consistency:  Answer:  Thin   04/19/23 0909            Objective: Vitals:   04/30/23 0100 04/30/23 0514 04/30/23 0852 04/30/23 1007  BP: 112/81 110/84  121/84  Pulse: (!) 101  92  (!) 116  Resp: 20 20  20   Temp: 98.1 F (36.7 C) (!) 97.5 F (36.4 C)  97.6 F (36.4 C)  TempSrc: Oral Oral  Axillary  SpO2: 97% 99% 98% 90%  Weight:      Height:        Intake/Output Summary (Last 24 hours) at 04/30/2023 1025 Last data filed at 04/30/2023  0933 Gross per 24 hour  Intake 243 ml  Output 825 ml  Net -582 ml   Filed Weights   04/19/23 1744  Weight: 90.7 kg    Scheduled Meds:  arformoterol  15 mcg Nebulization BID   budesonide (PULMICORT) nebulizer solution  0.5 mg Nebulization BID   guaiFENesin  600 mg Oral BID   ipratropium-albuterol  3 mL Nebulization QID   polyethylene glycol  17 g Oral BID   predniSONE  40 mg Oral Q breakfast   rivaroxaban  20 mg Oral Q supper   senna-docusate  1 tablet Oral QHS   sodium chloride flush  3 mL Intravenous Q12H   sodium chloride flush  3 mL Intravenous Q12H   tamsulosin  0.4 mg Oral QHS   Continuous Infusions:  albuterol Stopped (04/19/23 1105)    Nutritional status     Body mass index is 31.32 kg/m.  Data Reviewed:   CBC: Recent Labs  Lab 04/26/23 0349 04/27/23 0415 04/28/23 0419 04/29/23 0407 04/30/23 0340  WBC 11.0* 9.5 10.5 11.3* 13.9*  HGB 11.6* 11.8* 11.7* 11.3* 12.1*  HCT 38.3* 38.2* 38.3* 36.4* 40.2  MCV 97.7 95.0 96.0 95.5 97.3  PLT 226 222 226 212 219   Basic Metabolic Panel: Recent Labs  Lab 04/26/23 0349 04/27/23 0415 04/28/23 0419 04/29/23 0407 04/30/23 0340  NA 139 138 140 137 138  K 4.5 3.9 4.0 4.1 4.3  CL 98 94* 97* 98 98  CO2 32 35* 34* 31 29  GLUCOSE 144* 104* 113* 100* 122*  BUN 25* 26* 28* 28* 27*  CREATININE 0.85 0.90 1.05 0.86 0.74  CALCIUM 8.2* 8.2* 8.4* 8.5* 8.8*   GFR: Estimated Creatinine Clearance: 96.2 mL/min (by C-G formula based on SCr of 0.74 mg/dL). Liver Function Tests: Recent Labs  Lab 04/28/23 0419  AST 20  ALT 26  ALKPHOS 50  BILITOT 0.4  PROT 5.5*  ALBUMIN 2.7*   No results for input(s): "LIPASE", "AMYLASE" in the last 168 hours. No results for input(s): "AMMONIA" in the last 168 hours. Coagulation Profile: No results for input(s): "INR", "PROTIME" in the last 168 hours. Cardiac Enzymes: No results for input(s): "CKTOTAL", "CKMB", "CKMBINDEX", "TROPONINI" in the last 168 hours. BNP (last 3  results) No results for input(s): "PROBNP" in the last 8760 hours. HbA1C: No results for input(s): "HGBA1C" in the last 72 hours. CBG: No results for input(s): "GLUCAP" in the last 168 hours. Lipid Profile: No results for input(s): "CHOL", "HDL", "LDLCALC", "TRIG", "CHOLHDL", "LDLDIRECT" in the last 72 hours. Thyroid Function Tests: No results for input(s): "TSH", "T4TOTAL", "FREET4", "T3FREE", "THYROIDAB" in the last 72 hours. Anemia Panel: No results for input(s): "VITAMINB12", "FOLATE", "FERRITIN", "TIBC", "IRON", "RETICCTPCT" in the last 72 hours. Sepsis Labs: No results for input(s): "PROCALCITON", "LATICACIDVEN" in the last 168 hours.  Recent Results (from the past 240 hours)  Expectorated Sputum Assessment w Gram Stain, Rflx to Resp Cult     Status: None   Collection Time: 04/20/23 10:13 PM   Specimen: Sputum  Result Value Ref Range Status   Specimen Description SPUTUM  Final   Special  Requests Immunocompromised  Final   Sputum evaluation   Final    THIS SPECIMEN IS ACCEPTABLE FOR SPUTUM CULTURE Performed at [facility], [address], [place], [place] [address]    Report Status 04/20/2023 FINAL  Final  Culture, Respiratory w Gram Stain     Status: None   Collection Time: 04/20/23 10:13 PM   Specimen: SPU  Result Value Ref Range Status   Specimen Description   Final    SPUTUM Performed at [facility], [address], [place], [place] [address]    Special Requests   Final    Immunocompromised Reflexed from [id number] Performed at [facility], [address] [place], [place] [address]    Gram Stain   Final    FEW WBC PRESENT,BOTH PMN AND MONONUCLEAR FEW GRAM POSITIVE COCCI IN PAIRS IN CLUSTERS Performed at [facility], [address] [place], [place] [address]    Culture MODERATE PSEUDOMONAS AERUGINOSA  Final   Report Status 04/23/2023 FINAL  Final   Organism ID, Bacteria PSEUDOMONAS AERUGINOSA  Final       Susceptibility   Pseudomonas aeruginosa - MIC*    CEFTAZIDIME <=1 SENSITIVE Sensitive     CIPROFLOXACIN <=0.25 SENSITIVE Sensitive     GENTAMICIN <=1 SENSITIVE Sensitive     IMIPENEM <=0.25 SENSITIVE Sensitive     PIP/TAZO <=4 SENSITIVE Sensitive ug/mL    CEFEPIME 0.25 SENSITIVE Sensitive     * MODERATE PSEUDOMONAS AERUGINOSA  Acid Fast Smear (AFB)     Status: None   Collection Time: 04/28/23  5:19 PM   Specimen: Sputum; Respiratory  Result Value Ref Range Status   AFB Specimen Processing Concentration  Final   Acid Fast Smear Negative  Final    Comment: (NOTE) Performed At: [facility] [address] [place], [place] [id number] Jolene Schimke MD WU:[id number]    Source (AFB) SPUTUM  Final    Comment: Performed at [facility], [address] [place], [place] [address]         Radiology Studies: ECHOCARDIOGRAM COMPLETE Result Date: 04/28/2023    ECHOCARDIOGRAM REPORT   Patient Name:   Philip [name] Date of Exam: 04/28/2023 Medical Rec #:  [id number]        Height:       67.0 in Accession #:    [id number]       Weight:       200.0 lb Date of Birth:  [birth year]        BSA:          2.022 m Patient Age:    67 years         BP:           106/83 mmHg Patient Gender: M                HR:           105 bpm. Exam Location:  Inpatient Procedure: 2D Echo, Cardiac Doppler and Color Doppler (Both Spectral and Color            Flow Doppler were utilized during procedure). Indications:    Pulmonary Hypertension  History:        Patient has prior history of Echocardiogram examinations, most                 recent 06/17/2019. COPD; Risk Factors:Dyslipidemia and Former  Smoker.  Sonographer:    Karma Ganja Referring Phys: [id number] CHRISTOPHER P DANFORD IMPRESSIONS  1. Left ventricular ejection fraction, by estimation, is 60 to 65%. The left ventricle has normal function. The left ventricle has no regional wall motion abnormalities. Left ventricular  diastolic parameters are consistent with Grade I diastolic dysfunction (impaired relaxation).  2. Right ventricular systolic function is normal. The right ventricular size is normal.  3. Left atrial size was mildly dilated.  4. The mitral valve is normal in structure. No evidence of mitral valve regurgitation. No evidence of mitral stenosis.  5. The aortic valve is normal in structure. Aortic valve regurgitation is not visualized. No aortic stenosis is present.  6. The inferior vena cava is normal in size with greater than 50% respiratory variability, suggesting right atrial pressure of 3 mmHg. Comparison(s): Prior images reviewed side by side. FINDINGS  Left Ventricle: Left ventricular ejection fraction, by estimation, is 60 to 65%. The left ventricle has normal function. The left ventricle has no regional wall motion abnormalities. The left ventricular internal cavity size was normal in size. There is  no left ventricular hypertrophy. Left ventricular diastolic parameters are consistent with Grade I diastolic dysfunction (impaired relaxation). Right Ventricle: The right ventricular size is normal. No increase in right ventricular wall thickness. Right ventricular systolic function is normal. Left Atrium: Left atrial size was mildly dilated. Right Atrium: Right atrial size was normal in size. Pericardium: There is no evidence of pericardial effusion. Mitral Valve: The mitral valve is normal in structure. No evidence of mitral valve regurgitation. No evidence of mitral valve stenosis. Tricuspid Valve: The tricuspid valve is normal in structure. Tricuspid valve regurgitation is not demonstrated. No evidence of tricuspid stenosis. Aortic Valve: The aortic valve is normal in structure. Aortic valve regurgitation is not visualized. No aortic stenosis is present. Aortic valve mean gradient measures 3.0 mmHg. Aortic valve peak gradient measures 4.9 mmHg. Aortic valve area, by VTI measures 3.31 cm. Pulmonic Valve: The  pulmonic valve was normal in structure. Pulmonic valve regurgitation is not visualized. No evidence of pulmonic stenosis. Aorta: The aortic root is normal in size and structure. Venous: The inferior vena cava is normal in size with greater than 50% respiratory variability, suggesting right atrial pressure of 3 mmHg. IAS/Shunts: No atrial level shunt detected by color flow Doppler.  LEFT VENTRICLE PLAX 2D LVIDd:         4.40 cm   Diastology LVIDs:         3.00 cm   LV e' medial:    6.85 cm/s LV PW:         1.00 cm   LV E/e' medial:  10.0 LV IVS:        0.90 cm   LV e' lateral:   7.62 cm/s LVOT diam:     2.20 cm   LV E/e' lateral: 9.0 LV SV:         51 LV SV Index:   25 LVOT Area:     3.80 cm  RIGHT VENTRICLE RV Basal diam:  3.90 cm LEFT ATRIUM           Index        RIGHT ATRIUM           Index LA diam:      4.30 cm 2.13 cm/m   RA Area:     11.80 cm LA Vol (A2C): 69.0 ml 34.12 ml/m  RA Volume:   22.20 ml  10.98 ml/m LA Vol (A4C):  51.8 ml 25.62 ml/m  AORTIC VALVE AV Area (Vmax):    3.14 cm AV Area (Vmean):   3.31 cm AV Area (VTI):     3.31 cm AV Vmax:           111.00 cm/s AV Vmean:          74.700 cm/s AV VTI:            0.155 m AV Peak Grad:      4.9 mmHg AV Mean Grad:      3.0 mmHg LVOT Vmax:         91.70 cm/s LVOT Vmean:        65.100 cm/s LVOT VTI:          0.135 m LVOT/AV VTI ratio: 0.87  AORTA Ao Root diam: 3.90 cm Ao Asc diam:  2.90 cm MITRAL VALVE MV Area (PHT): 5.02 cm    SHUNTS MV Decel Time: 151 msec    Systemic VTI:  0.14 m MV E velocity: 68.60 cm/s  Systemic Diam: 2.20 cm MV A velocity: 92.50 cm/s MV E/A ratio:  0.74 Donato Schultz MD Electronically signed by Donato Schultz MD Signature Date/Time: 04/28/2023/3:38:51 PM    Final            LOS: 11 days   Time spent= 35 mins    Miguel Rota, MD [facility]  If 7PM-7AM, please contact night-coverage  04/30/2023, 10:25 AM

## 2023-04-30 NOTE — Progress Notes (Signed)
 SATURATION QUALIFICATIONS: (This note is used to comply with regulatory documentation for home oxygen)  Patient Saturations on Room Air at Rest = 89-91% on 7 L HFNC (patient unable to be on room air, since patient is usually on 6 L of oxygen at baseline)  Patient Saturations on Room Air while Ambulating = 81% on 8 L HFNC (patient unable to be on room air, since patient is usually on 6 L of oxygen at baseline)  Patient Saturations on 8 Liters of oxygen while Ambulating = 81% (took 3 minutes to recover)  Please briefly explain why patient needs home oxygen: patient requiring more than 6 L of oxygen, which 6 L of oxygen is patient's baseline.

## 2023-04-30 NOTE — Progress Notes (Signed)
  Inpatient Rehab Admissions Coordinator :  Per therapy recommendations, patient was screened for CIR candidacy by Ottie Glazier RN MSN.  At this time patient appears to be a potential candidate for CIR. I will place a rehab consult per protocol for full assessment. Please call me with any questions.  Ottie Glazier RN MSN Admissions Coordinator [phone]

## 2023-05-01 DIAGNOSIS — J9621 Acute and chronic respiratory failure with hypoxia: Secondary | ICD-10-CM | POA: Diagnosis not present

## 2023-05-01 LAB — CBC
HCT: 37 % — ABNORMAL LOW (ref 39.0–52.0)
Hemoglobin: 11.1 g/dL — ABNORMAL LOW (ref 13.0–17.0)
MCH: 29.5 pg (ref 26.0–34.0)
MCHC: 30 g/dL (ref 30.0–36.0)
MCV: 98.4 fL (ref 80.0–100.0)
Platelets: 185 10*3/uL (ref 150–400)
RBC: 3.76 MIL/uL — ABNORMAL LOW (ref 4.22–5.81)
RDW: 15.1 % (ref 11.5–15.5)
WBC: 12 10*3/uL — ABNORMAL HIGH (ref 4.0–10.5)
nRBC: 0 % (ref 0.0–0.2)

## 2023-05-01 LAB — BASIC METABOLIC PANEL WITH GFR
Anion gap: 8 (ref 5–15)
BUN: 27 mg/dL — ABNORMAL HIGH (ref 8–23)
CO2: 31 mmol/L (ref 22–32)
Calcium: 8.5 mg/dL — ABNORMAL LOW (ref 8.9–10.3)
Chloride: 99 mmol/L (ref 98–111)
Creatinine, Ser: 0.82 mg/dL (ref 0.61–1.24)
GFR, Estimated: >60 mL/min (ref >60)
Glucose, Bld: 117 mg/dL — ABNORMAL HIGH (ref 70–99)
Potassium: 4 mmol/L (ref 3.5–5.1)
Sodium: 138 mmol/L (ref 135–145)

## 2023-05-01 LAB — PHOSPHORUS: Phosphorus: 3.7 mg/dL (ref 2.5–4.6)

## 2023-05-01 LAB — BRAIN NATRIURETIC PEPTIDE: B Natriuretic Peptide: 91.8 pg/mL (ref 0.0–100.0)

## 2023-05-01 MED ORDER — PREDNISONE 20 MG PO TABS
40.0000 mg | ORAL_TABLET | Freq: Every day | ORAL | Status: AC
  Administered 2023-05-01 – 2023-05-02 (×2): 40 mg via ORAL
  Filled 2023-05-01 (×2): qty 2

## 2023-05-01 MED ORDER — PREDNISONE 20 MG PO TABS
20.0000 mg | ORAL_TABLET | Freq: Every day | ORAL | Status: DC
Start: 2023-05-07 — End: 2023-05-11

## 2023-05-01 MED ORDER — PREDNISONE 10 MG PO TABS: 10.0000 mg | ORAL_TABLET | Freq: Every day | ORAL | Status: DC

## 2023-05-01 MED ORDER — PREDNISONE 20 MG PO TABS
30.0000 mg | ORAL_TABLET | Freq: Every day | ORAL | Status: DC
  Administered 2023-05-03 – 2023-05-05 (×3): 30 mg via ORAL
  Filled 2023-05-01 (×3): qty 1

## 2023-05-01 NOTE — Progress Notes (Signed)
 Inpatient Rehab Admissions Coordinator:    I spoke with Pt. Regarding potential CIR admit. He is interested and states wife is his 24/7 caregiver. I will send case to insurance as Monia Pouch appears to be primary.   Megan Salon, MS, CCC-SLP Rehab Admissions Coordinator  [phone] (celll) [phone] (office)

## 2023-05-01 NOTE — Evaluation (Signed)
 Occupational Therapy Evaluation Patient Details Name: Philip [name] MRN: [id number] DOB: 1955-04-25 Today's Date: 05/01/2023   History of Present Illness   Pt is 68 yo male admitted on 04/18/23 with acute on chronic resp failure in setting of pseudomonas PNE , COPD exacerbation with possibly underlying worsening of radiation-induced fibrosis.  Pt with hx including but not limited to stage III adenocarcinoma of lung, PE on Xarelto, radiation-induced IPF, COPD, chronic hypoxia on 4-6 L nasal cannula, pulmonary MAI, bronchiectasis     Clinical Impressions Patient is a 68 year old male who was admitted for above. Patient was living at home with wife prior level with independence in ADLs on supplemental O2 at 6L/min. Currently patient needs 8L/min for activity. Patient is motivated to get stronger and return to daily activities. Patient was noted to have decreased functional activity tolerance, decreased endurance, decreased standing balance, decreased safety awareness, and decreased knowledge of AD/AE impacting participation in ADLs. Patient will benefit from intensive inpatient follow-up therapy, >3 hours/day.      If plan is discharge home, recommend the following:   A little help with walking and/or transfers;A lot of help with bathing/dressing/bathroom;Assistance with cooking/housework;Direct supervision/assist for medications management;Assist for transportation;Help with stairs or ramp for entrance;Direct supervision/assist for financial management     Functional Status Assessment   Patient has had a recent decline in their functional status and demonstrates the ability to make significant improvements in function in a reasonable and predictable amount of time.     Equipment Recommendations   None recommended by OT      Precautions/Restrictions   Precautions Precautions: Fall Precaution/Restrictions Comments: watch sats Restrictions Weight Bearing Restrictions Per  Provider Order: No     Mobility Bed Mobility Overal bed mobility: Needs Assistance Bed Mobility: Supine to Sit     Supine to sit: Supervision                 Balance Overall balance assessment: Needs assistance Sitting-balance support: No upper extremity supported Sitting balance-Leahy Scale: Good     Standing balance support: No upper extremity supported Standing balance-Leahy Scale: Good         ADL either performed or assessed with clinical judgement   ADL Overall ADL's : Needs assistance/impaired Eating/Feeding: Modified independent;Sitting   Grooming: Sitting;Set up   Upper Body Bathing: Sitting;Contact guard assist   Lower Body Bathing: Minimal assistance;Sitting/lateral leans Lower Body Bathing Details (indicate cue type and reason): able to complete figure four but educated on not holding breath to get onto lap. Upper Body Dressing : Sitting;Set up   Lower Body Dressing: Sitting/lateral leans;Minimal assistance Lower Body Dressing Details (indicate cue type and reason): educated on LB Dressing AE to reduce SOB. patient agreed to look at these during next session. Toilet Transfer: Minimal Cabin crew Details (indicate cue type and reason): to recliner in room with patient dropping ot 84% on 8L/min with education on deep breathing and positioning to return stats to 90s. Toileting- Clothing Manipulation and Hygiene: Sitting/lateral lean;Minimal assistance Toileting - Clothing Manipulation Details (indicate cue type and reason): education on toileting buddy and bidets.             Vision Baseline Vision/History: 1 Wears glasses              Pertinent Vitals/Pain Pain Assessment Pain Assessment: 0-10 Pain Score: 3  Pain Location: back Pain Descriptors / Indicators: Grimacing Pain Intervention(s): Monitored during session, Patient requesting pain meds-RN notified, RN gave pain meds during session  Extremity/Trunk  Assessment Upper Extremity Assessment Upper Extremity Assessment: Overall WFL for tasks assessed   Lower Extremity Assessment Lower Extremity Assessment: Defer to PT evaluation   Cervical / Trunk Assessment Cervical / Trunk Assessment: Normal      Cognition Arousal: Alert Behavior During Therapy: WFL for tasks assessed/performed Cognition: No apparent impairments             OT - Cognition Comments: patient is cooperative and very                                    Home Living Family/patient expects to be discharged to:: Private residence Living Arrangements: Spouse/significant other Available Help at Discharge: Family;Available 24 hours/day Type of Home: House Home Access: Stairs to enter [employer] of Steps: 2 Entrance Stairs-Rails: None Home Layout: One level     Bathroom Shower/Tub: Tub/shower unit         Home Equipment: Shower seat;Grab bars - tub/shower   Additional Comments: On 6 L O2 at home      Prior Functioning/Environment Prior Level of Function : Needs assist             Mobility Comments: Pt ambulatory in home and short community distances - pushes his O2 tank; limited by cardioresp endurance.  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, teach classes, crawl up on scaffolding but then was hospitalized and never recovered his endurance and on 6 L O2 since ADLs Comments: independent wtih adls; assist with iadls    OT Problem List: Decreased strength;Cardiopulmonary status limiting activity;Decreased safety awareness;Decreased activity tolerance;Decreased knowledge of use of DME or AE   OT Treatment/Interventions: Self-care/ADL training;DME and/or AE instruction;Therapeutic activities;Balance training;Therapeutic exercise;Energy conservation;Patient/family education      OT Goals(Current goals can be found in the care plan section)   Acute Rehab OT Goals OT Goal Formulation: With patient Time For Goal  Achievement: 05/15/23 Potential to Achieve Goals: Fair   OT Frequency:  Min 2X/week       AM-PAC OT "6 Clicks" Daily Activity     Outcome Measure Help from another person eating meals?: None Help from another person taking care of personal grooming?: A Little Help from another person toileting, which includes using toliet, bedpan, or urinal?: A Lot Help from another person bathing (including washing, rinsing, drying)?: A Lot Help from another person to put on and taking off regular upper body clothing?: A Little Help from another person to put on and taking off regular lower body clothing?: A Lot 6 Click Score: 16   End of Session Equipment Utilized During Treatment: Gait belt;Oxygen Nurse Communication: Mobility status  Activity Tolerance: Patient tolerated treatment well Patient left: in chair;with call bell/phone within reach  OT Visit Diagnosis: Unsteadiness on feet (R26.81);Other abnormalities of gait and mobility (R26.89);Pain                Time: 4098-1191 OT Time Calculation (min): 42 min Charges:  OT General Charges $OT Visit: 1 Visit OT Evaluation $OT Eval Low Complexity: 1 Low OT Treatments $Self Care/Home Management : 23-37 mins  Leita Lindbloom OTR/L, MS Acute Rehabilitation Department Office# [phone]   Selinda Flavin 05/01/2023, 11:01 AM

## 2023-05-01 NOTE — Progress Notes (Signed)
 PROGRESS NOTE    Philip [name]  ONG:[id number] DOB: 12/07/1955 DOA: 04/18/2023 PCP: Farris Has, MD    Brief Narrative:   68 year old with history of stage III adenocarcinoma of lung, PE on Xarelto, radiation-induced IPF, COPD, chronic hypoxia on 4-6 L nasal cannula, pulmonary MAI, bronchiectasis initially presented to the hospital on 3/16 for dyspnea, cough and tachycardia.  CTA of the chest was negative for PE but showed bilateral groundglass opacities and admitted to [facility] service.  Initial treatment was initiated for COPD exacerbation along with CAP on steroids, bronchodilators and Rocephin/azithromycin.  COVID, flu and RSV were negative.  Eventually cultures on 3/20 resulted as Pseudomonas therefore pulmonary was consulted and antibiotics were changed to cefepime.  Patient was also transitioned off IV steroids to p.o. prednisone.  Echocardiogram showed preserved EF.   From respiratory standpoint patient is slowly improving but due to acute comorbidities and physical deconditioning, PT recommended CIR.  Inpatient rehab team consulted.  Assessment & Plan:  Principal Problem:   Acute on chronic hypoxic respiratory failure (HCC) Active Problems:   COPD (chronic obstructive pulmonary disease) (HCC)   Adenocarcinoma of right lung, stage 3 (HCC)   Chronic pulmonary embolism   BPH (benign prostatic hyperplasia)   History of lung cancer   Hyperlipidemia   Class I obesity   Acute respiratory distress Acute on chronic hypoxia -This is multifactorial in nature in the setting of Pseudomonas pneumonia and COPD exacerbation with possibly underlying worsening of radiation-induced fibrosis.  Pseudomonas pneumonia - Initially treated with Rocephin and azithromycin and eventually transition to 7 days of cefepime which patient has completed.  Acute COPD exacerbation - Acute on chronic hypoxia - At baseline uses 4 to 6 L of nasal cannula.  Initially started on IV Solu-Medrol now has been  transitioned to prednisone.  Echocardiogram shows preserved EF and has responded well to as needed diuretics.  Prednisone 40 mg, 2-week taper ordered  History of recurrent non-small cell lung cancer - Previously received chemotherapy and immunotherapy back in 2020 and 2022.  Followed by outpatient pulmonary team.  History of radiation induced IPF - Already on bronchodilators and steroids  History of pulmonary MAI Chronic pulmonary embolism - Patient currently is on Xarelto.  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  For now continue Flomax    DVT prophylaxis: Xarelto    Code Status: Full Code Family Communication:   Status is: Inpatient Remains inpatient appropriate because: Still quite weak, PT/OT.    Subjective: Working with OT, no complaints.   Examination:  General exam: Appears calm and comfortable; 7L [place]  Respiratory system: Clear to auscultation. Respiratory effort normal. Cardiovascular system: S1 & S2 heard, RRR. No JVD, murmurs, rubs, gallops or clicks. No pedal edema. Gastrointestinal system: Abdomen is nondistended, soft and nontender. No organomegaly or masses felt. Normal bowel sounds heard. Central nervous system: Alert and oriented. No focal neurological deficits. Extremities: Symmetric 5 x 5 power. Skin: No rashes, lesions or ulcers Psychiatry: Judgement and insight appear normal. Mood & affect appropriate.                   Diet Orders (From admission, onward)     Start     Ordered   04/19/23 0910  Diet regular Fluid consistency: Thin  Diet effective now       Question:  Fluid consistency:  Answer:  Thin   04/19/23 0909            Objective:  Vitals:   05/01/23 0440 05/01/23 0820 05/01/23 0827 05/01/23 0945  BP: 109/66     Pulse: 90     Resp: 16     Temp: 97.6 F (36.4 C)     TempSrc: Oral     SpO2: 100% 99% 99% 93%  Weight:      Height:        Intake/Output Summary  (Last 24 hours) at 05/01/2023 1025 Last data filed at 05/01/2023 0743 Gross per 24 hour  Intake 240 ml  Output 1325 ml  Net -1085 ml   Filed Weights   04/19/23 1744  Weight: 90.7 kg    Scheduled Meds:  arformoterol  15 mcg Nebulization BID   budesonide (PULMICORT) nebulizer solution  0.5 mg Nebulization BID   guaiFENesin  600 mg Oral BID   ipratropium-albuterol  3 mL Nebulization QID   polyethylene glycol  17 g Oral BID   predniSONE  40 mg Oral Q breakfast   Followed by   Melene Muller ON 05/03/2023] predniSONE  30 mg Oral Q breakfast   Followed by   Melene Muller ON 05/07/2023] predniSONE  20 mg Oral Q breakfast   Followed by   Melene Muller ON 05/11/2023] predniSONE  10 mg Oral Q breakfast   rivaroxaban  20 mg Oral Q supper   senna-docusate  1 tablet Oral QHS   sodium chloride flush  3 mL Intravenous Q12H   sodium chloride flush  3 mL Intravenous Q12H   tamsulosin  0.4 mg Oral QHS   Continuous Infusions:  Nutritional status     Body mass index is 31.32 kg/m.  Data Reviewed:   CBC: Recent Labs  Lab 04/27/23 0415 04/28/23 0419 04/29/23 0407 04/30/23 0340 05/01/23 0438  WBC 9.5 10.5 11.3* 13.9* 12.0*  HGB 11.8* 11.7* 11.3* 12.1* 11.1*  HCT 38.2* 38.3* 36.4* 40.2 37.0*  MCV 95.0 96.0 95.5 97.3 98.4  PLT 222 226 212 219 185   Basic Metabolic Panel: Recent Labs  Lab 04/27/23 0415 04/28/23 0419 04/29/23 0407 04/30/23 0340 05/01/23 0438  NA 138 140 137 138 138  K 3.9 4.0 4.1 4.3 4.0  CL 94* 97* 98 98 99  CO2 35* 34* 31 29 31   GLUCOSE 104* 113* 100* 122* 117*  BUN 26* 28* 28* 27* 27*  CREATININE 0.90 1.05 0.86 0.74 0.82  CALCIUM 8.2* 8.4* 8.5* 8.8* 8.5*  PHOS  --   --   --   --  3.7   GFR: Estimated Creatinine Clearance: 93.8 mL/min (by C-G formula based on SCr of 0.82 mg/dL). Liver Function Tests: Recent Labs  Lab 04/28/23 0419  AST 20  ALT 26  ALKPHOS 50  BILITOT 0.4  PROT 5.5*  ALBUMIN 2.7*   No results for input(s): "LIPASE", "AMYLASE" in the last 168 hours. No  results for input(s): "AMMONIA" in the last 168 hours. Coagulation Profile: No results for input(s): "INR", "PROTIME" in the last 168 hours. Cardiac Enzymes: No results for input(s): "CKTOTAL", "CKMB", "CKMBINDEX", "TROPONINI" in the last 168 hours. BNP (last 3 results) No results for input(s): "PROBNP" in the last 8760 hours. HbA1C: No results for input(s): "HGBA1C" in the last 72 hours. CBG: No results for input(s): "GLUCAP" in the last 168 hours. Lipid Profile: No results for input(s): "CHOL", "HDL", "LDLCALC", "TRIG", "CHOLHDL", "LDLDIRECT" in the last 72 hours. Thyroid Function Tests: No results for input(s): "TSH", "T4TOTAL", "FREET4", "T3FREE", "THYROIDAB" in the last 72 hours. Anemia Panel: No results for input(s): "VITAMINB12", "FOLATE", "FERRITIN", "TIBC", "IRON", "RETICCTPCT" in the  last 72 hours. Sepsis Labs: No results for input(s): "PROCALCITON", "LATICACIDVEN" in the last 168 hours.  Recent Results (from the past 240 hours)  Acid Fast Smear (AFB)     Status: None   Collection Time: 04/28/23  5:19 PM   Specimen: Sputum; Respiratory  Result Value Ref Range Status   AFB Specimen Processing Concentration  Final   Acid Fast Smear Negative  Final    Comment: (NOTE) Performed At: [facility] [address] [place], [place] [id number] Jolene Schimke MD WU:[id number]    Source (AFB) SPUTUM  Final    Comment: Performed at [facility], [address], [place], [place] [address]         Radiology Studies: No results found.         LOS: 12 days   Time spent= 35 mins    Miguel Rota, MD [facility]  If 7PM-7AM, please contact night-coverage  05/01/2023, 10:25 AM

## 2023-05-01 NOTE — Plan of Care (Signed)

## 2023-05-02 DIAGNOSIS — J9621 Acute and chronic respiratory failure with hypoxia: Secondary | ICD-10-CM | POA: Diagnosis not present

## 2023-05-02 LAB — BASIC METABOLIC PANEL WITH GFR
Anion gap: 9 (ref 5–15)
BUN: 29 mg/dL — ABNORMAL HIGH (ref 8–23)
CO2: 36 mmol/L — ABNORMAL HIGH (ref 22–32)
Calcium: 9.1 mg/dL (ref 8.9–10.3)
Chloride: 98 mmol/L (ref 98–111)
Creatinine, Ser: 1 mg/dL (ref 0.61–1.24)
GFR, Estimated: >60 mL/min (ref >60)
Glucose, Bld: 99 mg/dL (ref 70–99)
Potassium: 5 mmol/L (ref 3.5–5.1)
Sodium: 143 mmol/L (ref 135–145)

## 2023-05-02 LAB — CBC
HCT: 39.3 % (ref 39.0–52.0)
Hemoglobin: 11.8 g/dL — ABNORMAL LOW (ref 13.0–17.0)
MCH: 29.1 pg (ref 26.0–34.0)
MCHC: 30 g/dL (ref 30.0–36.0)
MCV: 97 fL (ref 80.0–100.0)
Platelets: 200 10*3/uL (ref 150–400)
RBC: 4.05 MIL/uL — ABNORMAL LOW (ref 4.22–5.81)
RDW: 15.1 % (ref 11.5–15.5)
WBC: 12.3 10*3/uL — ABNORMAL HIGH (ref 4.0–10.5)
nRBC: 0 % (ref 0.0–0.2)

## 2023-05-02 NOTE — Plan of Care (Signed)
  Problem: Education: Goal: Knowledge of General Education information will improve Description: Including pain rating scale, medication(s)/side effects and non-pharmacologic comfort measures Outcome: Progressing   Problem: Nutrition: Goal: Adequate nutrition will be maintained Outcome: Progressing   Problem: Activity: Goal: Risk for activity intolerance will decrease Outcome: Progressing   

## 2023-05-02 NOTE — Progress Notes (Signed)
 PROGRESS NOTE    Philip [name]  ZOX:[id number] DOB: April 11, 1955 DOA: 04/18/2023 PCP: Philip Has, MD    Brief Narrative:   68 year old with history of stage III adenocarcinoma of lung, PE on Xarelto, radiation-induced IPF, COPD, chronic hypoxia on 4-6 L nasal cannula, pulmonary MAI, bronchiectasis initially presented to the hospital on 3/16 for dyspnea, cough and tachycardia.  CTA of the chest was negative for PE but showed bilateral groundglass opacities and admitted to [facility] service.  Initial treatment was initiated for COPD exacerbation along with CAP on steroids, bronchodilators and Rocephin/azithromycin.  COVID, flu and RSV were negative.  Eventually cultures on 3/20 resulted as Pseudomonas therefore pulmonary was consulted and antibiotics were changed to cefepime.  Patient was also transitioned off IV steroids to p.o. prednisone.  Echocardiogram showed preserved EF.   Overall patient is doing well, transition to p.o. 2-week prednisone taper.  From respiratory standpoint patient is slowly improving but due to acute comorbidities and physical deconditioning, PT recommended CIR.  Inpatient rehab team consulted.  Assessment & Plan:  Principal Problem:   Acute on chronic hypoxic respiratory failure (HCC) Active Problems:   COPD (chronic obstructive pulmonary disease) (HCC)   Adenocarcinoma of right lung, stage 3 (HCC)   Chronic pulmonary embolism   BPH (benign prostatic hyperplasia)   History of lung cancer   Hyperlipidemia   Class I obesity   Acute respiratory distress, improved Acute on chronic hypoxia, improved -This is multifactorial in nature in the setting of Pseudomonas pneumonia and COPD exacerbation with possibly underlying worsening of radiation-induced fibrosis.  Pseudomonas pneumonia, stable - Initially treated with Rocephin and azithromycin and eventually transition to 7 days of cefepime which patient [name] completed.  Acute COPD exacerbation, stable - Acute on  chronic hypoxia - At baseline uses 4 to 6 L of nasal cannula.  Initially started on IV Solu-Medrol now [name] been transitioned to prednisone.  Echocardiogram shows preserved EF and [name] responded well to as needed diuretics.  Prednisone 40 mg, 2-week taper ordered  History of recurrent non-small cell lung cancer - Previously received chemotherapy and immunotherapy back in 2020 and 2022.  Followed by outpatient pulmonary team.  History of radiation induced IPF - Already on bronchodilators and steroids  History of pulmonary MAI Chronic pulmonary embolism - Patient currently is on Xarelto.  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  For now continue Flomax  Hold further lab draws unless needed.   DVT prophylaxis: Xarelto    Code Status: Full Code Family Communication: Sessile updated spouse Status is: Inpatient Remains inpatient appropriate because: Pending CIR placement   Subjective: Breathing improved no complaints  Examination:  General exam: Appears calm and comfortable; 7L [place]  Respiratory system: Clear to auscultation. Respiratory effort normal. Cardiovascular system: S1 & S2 heard, RRR. No JVD, murmurs, rubs, gallops or clicks. No pedal edema. Gastrointestinal system: Abdomen is nondistended, soft and nontender. No organomegaly or masses felt. Normal bowel sounds heard. Central nervous system: Alert and oriented. No focal neurological deficits. Extremities: Symmetric 5 x 5 power. Skin: No rashes, lesions or ulcers Psychiatry: Judgement and insight appear normal. Mood & affect appropriate.                   Diet Orders (From admission, onward)     Start     Ordered   04/19/23 0910  Diet regular Fluid consistency: Thin  Diet effective now       Question:  Fluid consistency:  Answer:  Thin   04/19/23 0909            Objective: Vitals:   05/02/23 0348 05/02/23 0522 05/02/23 0905 05/02/23 0919  BP:  115/71 112/82    Pulse: 80 (!) 115 (!) 108   Resp: (!) 24 20    Temp: 97.6 F (36.4 C) 97.6 F (36.4 C)    TempSrc: Oral Oral    SpO2: 96% 98% 97% 95%  Weight:      Height:        Intake/Output Summary (Last 24 hours) at 05/02/2023 1026 Last data filed at 05/02/2023 0908 Gross per 24 hour  Intake --  Output 600 ml  Net -600 ml   Filed Weights   04/19/23 1744  Weight: 90.7 kg    Scheduled Meds:  arformoterol  15 mcg Nebulization BID   budesonide (PULMICORT) nebulizer solution  0.5 mg Nebulization BID   guaiFENesin  600 mg Oral BID   ipratropium-albuterol  3 mL Nebulization QID   polyethylene glycol  17 g Oral BID   [START ON 05/03/2023] predniSONE  30 mg Oral Q breakfast   Followed by   Melene Muller ON 05/07/2023] predniSONE  20 mg Oral Q breakfast   Followed by   Melene Muller ON 05/11/2023] predniSONE  10 mg Oral Q breakfast   rivaroxaban  20 mg Oral Q supper   senna-docusate  1 tablet Oral QHS   sodium chloride flush  3 mL Intravenous Q12H   sodium chloride flush  3 mL Intravenous Q12H   tamsulosin  0.4 mg Oral QHS   Continuous Infusions:  Nutritional status     Body mass index is 31.32 kg/m.  Data Reviewed:   CBC: Recent Labs  Lab 04/28/23 0419 04/29/23 0407 04/30/23 0340 05/01/23 0438 05/02/23 0350  WBC 10.5 11.3* 13.9* 12.0* 12.3*  HGB 11.7* 11.3* 12.1* 11.1* 11.8*  HCT 38.3* 36.4* 40.2 37.0* 39.3  MCV 96.0 95.5 97.3 98.4 97.0  PLT 226 212 219 185 200   Basic Metabolic Panel: Recent Labs  Lab 04/28/23 0419 04/29/23 0407 04/30/23 0340 05/01/23 0438 05/02/23 0350  NA 140 137 138 138 143  K 4.0 4.1 4.3 4.0 5.0  CL 97* 98 98 99 98  CO2 34* 31 29 31  36*  GLUCOSE 113* 100* 122* 117* 99  BUN 28* 28* 27* 27* 29*  CREATININE 1.05 0.86 0.74 0.82 1.00  CALCIUM 8.4* 8.5* 8.8* 8.5* 9.1  PHOS  --   --   --  3.7  --    GFR: Estimated Creatinine Clearance: 77 mL/min (by C-G formula based on SCr of 1 mg/dL). Liver Function Tests: Recent Labs  Lab 04/28/23 0419   AST 20  ALT 26  ALKPHOS 50  BILITOT 0.4  PROT 5.5*  ALBUMIN 2.7*   No results for input(s): "LIPASE", "AMYLASE" in the last 168 hours. No results for input(s): "AMMONIA" in the last 168 hours. Coagulation Profile: No results for input(s): "INR", "PROTIME" in the last 168 hours. Cardiac Enzymes: No results for input(s): "CKTOTAL", "CKMB", "CKMBINDEX", "TROPONINI" in the last 168 hours. BNP (last 3 results) No results for input(s): "PROBNP" in the last 8760 hours. HbA1C: No results for input(s): "HGBA1C" in the last 72 hours. CBG: No results for input(s): "GLUCAP" in the last 168 hours. Lipid Profile: No results for input(s): "CHOL", "HDL", "LDLCALC", "TRIG", "CHOLHDL", "LDLDIRECT" in the last 72 hours. Thyroid Function Tests: No results for input(s): "TSH", "T4TOTAL", "FREET4", "T3FREE", "THYROIDAB" in the last 72 hours. Anemia Panel: No  results for input(s): "VITAMINB12", "FOLATE", "FERRITIN", "TIBC", "IRON", "RETICCTPCT" in the last 72 hours. Sepsis Labs: No results for input(s): "PROCALCITON", "LATICACIDVEN" in the last 168 hours.  Recent Results (from the past 240 hours)  Acid Fast Smear (AFB)     Status: None   Collection Time: 04/28/23  5:19 PM   Specimen: Sputum; Respiratory  Result Value Ref Range Status   AFB Specimen Processing Concentration  Final   Acid Fast Smear Negative  Final    Comment: (NOTE) Performed At: [facility] [address] [place], [place] [id number] Jolene Schimke MD JX:[id number]    Source (AFB) SPUTUM  Final    Comment: Performed at [facility], [address], [place], [place] [address]         Radiology Studies: No results found.         LOS: 13 days   Time spent= 35 mins    Miguel Rota, MD [facility]  If 7PM-7AM, please contact night-coverage  05/02/2023, 10:26 AM

## 2023-05-02 NOTE — PMR Pre-admission (Signed)
 PMR Admission Coordinator Pre-Admission Assessment  Patient: Philip [name] is an 68 y.o., male MRN: [id number] DOB: 06/07/1955 Height: 5\' 7"  (170.2 cm) Weight: 90.7 kg  Insurance Information HMO:     PPO: yes      PCP:      IPA:      80/20:      OTHER:  PRIMARY: [facility]      Policy#: [id number]       Subscriber: Pt CM Name: Erling Cruz      Phone#: [phone]  or [phone]   Fax#:  [phone] Pt. Approved via fax 4/1 for admission 4/1-4/8.  Pre-Cert#:[id number]      Employer: pt Benefits:  Phone #:      Name:  Dolores Hoose Date: 02/03/2023 - still active  Deductible: $3,300 ($3,300 met)  OOP Max: $5,000 ($2,595.63 met)  CIR: 80% coverage; 20% coinsurance SNF: 80% coverage; 20% coinsurance Outpatient:  80% coverage; 20% coinsurance Home Health:  80% coverage; 20% coinsurance DME: 80% coverage; 20% coinsurance Providers: in network  SECONDARY: Medicare Part A       Policy#: [id number]      Phone#:   Financial Counselor:       Phone#:   The "Data Collection Information Summary" for patients in Inpatient Rehabilitation Facilities with attached "Privacy Act Statement-Health Care Records" was provided and verbally reviewed with: Patient  Emergency Contact Information Contact Information     Name Relation Home Work Mobile   Pesqueira,[name] Spouse [phone]  [phone]      Other Contacts   None on File     Current Medical History  Patient Admitting Diagnosis: Respiratory Failure  History of Present Illness: Philip [name] is a 68 year old right-handed male with history of stage III adenocarcinoma of the lung followed by Dr. 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 Jettie Pagan.  Per chart review patient lives with spouse.  1 level home 2 steps to entry.  Ambulatory in the home and short community distances.  Patient is O2 tank limited by cardiorespiratory endurance.  Patient reports up until December 2023 he was able to work and was independent.  Presented to [facility] 04/18/2023 with increasing shortness of breath and right-sided chest pain x 2 weeks with sputum production.  Patient also endorsing dysuria and fatigue.  Patient had been scheduled for cystoscopy with right sided ureteral stent replacement 04/18/2023 but in the setting of acute hypoxic respiratory failure was admitted to the hospital.  Chest x-ray stable cardiomegaly with emphysematous lung disease and mild to moderate severity areas of bilateral linear scarring and/or atelectasis.  CT angiogram of the chest showed no pulmonary embolism.  Admission chemistries unremarkable except chloride 97 CO2 33 glucose 106, urinalysis negative nitrite, strep pneumo urinary antigen negative.  Respiratory panel negative for COVID, RSV and flu.  Echocardiogram ejection fraction of 60 to 65% no wall motion abnormalities grade 1 diastolic dysfunction.  Initial treatment was initiated for COPD exacerbation along with CAP on steroids, bronchodilators as well as placed on Rocephin/azithromycin.  Eventually cultures on 3/20 resulted in Pseudomonas therefore pulmonary was consulted and antibiotics were changed to cefepime.  Patient was transition off IV steroids to p.o. prednisone.  Patient currently remains on chronic Xarelto.  Palliative care following to establish goals of care.  Tolerating a regular consistency diet.  Therapy evaluations completed due to patient decreased functional mobility was admitted for a comprehensive rehab program.     Patient's medical record from [facility] has been reviewed by the rehabilitation admission coordinator and physician.  Past  Medical History  Past Medical History:  Diagnosis Date   Chronic kidney disease    COPD (chronic obstructive pulmonary disease) (HCC)    Dyspnea    GERD (gastroesophageal reflux disease)    Headache    migraines   Hyperlipidemia    lung ca dx'd 12/2018   On home oxygen therapy    3 to 10 L   Pneumonia    Pulmonary embolus (HCC)    Tobacco abuse     Has the patient had major surgery during 100 days prior to admission? No  Family History   family history includes Cancer in his father; Stomach cancer in his father.  Current Medications  Current Facility-Administered Medications:    alum & mag hydroxide-simeth (MAALOX/MYLANTA) 200-200-20 MG/5ML suspension 15 mL, 15 mL, Oral, Q4H PRN, Danford, Earl Lites, MD, 15 mL at 05/02/23 0904   arformoterol (BROVANA) nebulizer solution 15 mcg, 15 mcg, Nebulization, BID, Lewie Chamber, MD, 15 mcg at 05/02/23 6295   bisacodyl (DULCOLAX) suppository 10 mg, 10 mg, Rectal, Daily PRN, Krugh, Marissa C, DO   budesonide (PULMICORT) nebulizer solution 0.5 mg, 0.5 mg, Nebulization, BID, Desai, Rahul P, [name], 0.5 mg at 05/02/23 2841   guaiFENesin (MUCINEX) 12 hr tablet 600 mg, 600 mg, Oral, BID, Sundil, Subrina, MD, 600 mg at 05/02/23 0905   guaiFENesin-dextromethorphan (ROBITUSSIN DM) 100-10 MG/5ML syrup 5 mL, 5 mL, Oral, Q4H PRN, Danford, Earl Lites, MD, 5 mL at 04/24/23 2348   hydrALAZINE (APRESOLINE) injection 10 mg, 10 mg, Intravenous, Q4H PRN, Amin, Ankit C, MD   HYDROcodone-acetaminophen (NORCO/VICODIN) 5-325 MG per tablet 1 tablet, 1 tablet, Oral, Q12H PRN, Lewie Chamber, MD, 1 tablet at 05/01/23 1625   ibuprofen (ADVIL) tablet 400 mg, 400 mg, Oral, Q6H PRN, Janalyn Shy, Subrina, MD, 400 mg at 04/20/23 0903   ipratropium-albuterol (DUONEB) 0.5-2.5 (3) MG/3ML nebulizer solution 3 mL, 3 mL, Nebulization, Q4H PRN, Janalyn Shy, Subrina, MD, 3 mL at 04/19/23 0225   ipratropium-albuterol (DUONEB) 0.5-2.5 (3) MG/3ML nebulizer solution 3 mL, 3 mL,  Nebulization, QID, Lewie Chamber, MD, 3 mL at 05/02/23 1157   metoprolol tartrate (LOPRESSOR) injection 5 mg, 5 mg, Intravenous, Q4H PRN, Amin, Ankit C, MD   ondansetron (ZOFRAN) tablet 4 mg, 4 mg, Oral, Q6H PRN **OR** ondansetron (ZOFRAN) injection 4 mg, 4 mg, Intravenous, Q6H PRN, Janalyn Shy, Subrina, MD, 4 mg at 05/02/23 0103   Oral care mouth rinse, 15 mL, Mouth Rinse, PRN, Lewie Chamber, MD   oxyCODONE (ROXICODONE) 5 MG/5ML solution 1.5 mg, 1.5 mg, Oral, Q4H PRN, Amin, Ankit C, MD, 1.5 mg at 05/02/23 1307   polyethylene glycol (MIRALAX / GLYCOLAX) packet 17 g, 17 g, Oral, BID, Danford, Earl Lites, MD, 17 g at 05/02/23 3244   [COMPLETED] predniSONE (DELTASONE) tablet 40 mg, 40 mg, Oral, Q breakfast, 40 mg at 05/02/23 0905 **FOLLOWED BY** [START ON 05/03/2023] predniSONE (DELTASONE) tablet 30 mg, 30 mg, Oral, Q breakfast **FOLLOWED BY** [START ON 05/07/2023] predniSONE (DELTASONE) tablet 20 mg, 20 mg, Oral, Q breakfast **FOLLOWED BY** [START ON 05/11/2023] predniSONE (DELTASONE) tablet 10 mg, 10 mg, Oral, Q breakfast, Amin, Ankit C, MD   rivaroxaban (XARELTO) tablet 20 mg, 20 mg, Oral, Q supper, Sundil, Subrina,  MD, 20 mg at 05/01/23 1624   senna-docusate (Senokot-S) tablet 1 tablet, 1 tablet, Oral, QHS, Danford, Earl Lites, MD, 1 tablet at 05/01/23 2106   senna-docusate (Senokot-S) tablet 1 tablet, 1 tablet, Oral, QHS PRN, Amin, Ankit C, MD, 1 tablet at 04/30/23 2234   sodium chloride flush (NS) 0.9 % injection 3 mL, 3 mL, Intravenous, Q12H, Sundil, Subrina, MD, 3 mL at 05/02/23 0905   sodium chloride flush (NS) 0.9 % injection 3 mL, 3 mL, Intravenous, Q12H, Sundil, Subrina, MD, 3 mL at 05/02/23 1191   sodium chloride flush (NS) 0.9 % injection 3 mL, 3 mL, Intravenous, PRN, Janalyn Shy, Subrina, MD   tamsulosin [facility]) capsule 0.4 mg, 0.4 mg, Oral, QHS, Sundil, Subrina, MD, 0.4 mg at 05/01/23 2106  Patients Current Diet:  Diet Order             Diet regular Fluid consistency: Thin  Diet effective  now                   Precautions / Restrictions Precautions Precautions: Fall Precaution/Restrictions Comments: Pt active in the community PTA Restrictions Weight Bearing Restrictions Per Provider Order: No   Has the patient had 2 or more falls or a fall with injury in the past year? No  Prior Activity Level Community (5-7x/wk): Pt. is acitve in the community  Prior Functional Level Self Care: Did the patient need help bathing, dressing, using the toilet or eating? Independent  Indoor Mobility: Did the patient need assistance with walking from room to room (with or without device)? Independent  Stairs: Did the patient need assistance with internal or external stairs (with or without device)? Needed some help  Functional Cognition: Did the patient need help planning regular tasks such as shopping or remembering to take medications? Independent  Patient Information Are you of Hispanic, Latino/a,or Spanish origin?: A. No, not of Hispanic, Latino/a, or Spanish origin What is your race?: A. White Do you need or want an interpreter to communicate with a doctor or health care staff?: 1. Yes  Patient's Response To:  Health Literacy and Transportation Is the patient able to respond to health literacy and transportation needs?: Yes Health Literacy - How often do you need to have someone help you when you read instructions, pamphlets, or other written material from your doctor or pharmacy?: Never In the past 12 months, has lack of transportation kept you from medical appointments or from getting medications?: No In the past 12 months, has lack of transportation kept you from meetings, work, or from getting things needed for daily living?: No  Journalist, newspaper / Equipment Home Equipment: Shower seat, Grab bars - tub/shower  Prior Device Use: Indicate devices/aids used by the patient prior to current illness, exacerbation or injury? None of the above  Current Functional  Level Cognition  Orientation Level: Oriented X4    Extremity Assessment (includes Sensation/Coordination)  Upper Extremity Assessment: Overall WFL for tasks assessed  Lower Extremity Assessment: Defer to PT evaluation RLE Deficits / Details: ROM WFL; MMT 5/5 but fatigues LLE Deficits / Details: ROM WFL; MMT 5/5 but fatigues    ADLs  Overall ADL's : Needs assistance/impaired Eating/Feeding: Modified independent, Sitting Grooming: Sitting, Set up Upper Body Bathing: Sitting, Contact guard assist Lower Body Bathing: Minimal assistance, Sitting/lateral leans Lower Body Bathing Details (indicate cue type and reason): able to complete figure four but educated on not holding breath to get onto lap. Upper Body Dressing : Sitting, Set up Lower Body Dressing: Sitting/lateral  leans, Minimal assistance Lower Body Dressing Details (indicate cue type and reason): educated on LB Dressing AE to reduce SOB. patient agreed to look at these during next session. Toilet Transfer: Minimal assistance, Tax adviser Details (indicate cue type and reason): to recliner in room with patient dropping ot 84% on 8L/min with education on deep breathing and positioning to return stats to 90s. Toileting- Clothing Manipulation and Hygiene: Sitting/lateral lean, Minimal assistance Toileting - Clothing Manipulation Details (indicate cue type and reason): education on toileting buddy and bidets.    Mobility  Overal bed mobility: Needs Assistance Bed Mobility: Supine to Sit Supine to sit: Supervision Sit to supine: Supervision    Transfers  Overall transfer level: Needs assistance Equipment used: None Transfers: Sit to/from Stand Sit to Stand: Contact guard assist    Ambulation / Gait / Stairs / Wheelchair Mobility  Ambulation/Gait Ambulation/Gait assistance: Contact guard assist Gait Distance (Feet): 25 Feet (ambulated 25'x2) Assistive device:  (O2 tank) Gait Pattern/deviations: Step-through  pattern General Gait Details: Ambulated out in hall 25' and then stood for recovery 3 mins then back 25' into room . CGA for safety Gait velocity: decreased    Posture / Balance Balance Overall balance assessment: Needs assistance Sitting-balance support: No upper extremity supported Sitting balance-Leahy Scale: Good Standing balance support: No upper extremity supported Standing balance-Leahy Scale: Good    Special needs/care consideration Special service needs oxygen **   Previous Home Environment (from acute therapy documentation) Living Arrangements: Spouse/significant other Available Help at Discharge: Family, Available 24 hours/day Type of Home: House Home Layout: One level Home Access: Stairs to enter Entrance Stairs-Rails: None Entrance Stairs-Number of Steps: 2 Bathroom Shower/Tub: Engineer, manufacturing systems: Standard Bathroom Accessibility: Yes How Accessible: Accessible via walker Home Care Services: Yes Type of Home Care Services: Other (Comment) (respiratory therapy) Additional Comments: On 6 L O2 at home  Discharge Living Setting Plans for Discharge Living Setting: Patient's home Type of Home at Discharge: House Discharge Home Layout: One level Discharge Home Access: Stairs to enter Entrance Stairs-Rails: None Entrance Stairs-Number of Steps: 2 Discharge Bathroom Shower/Tub: Tub/shower unit Discharge Bathroom Toilet: Standard Discharge Bathroom Accessibility: Yes How Accessible: Accessible via walker Does the patient have any problems obtaining your medications?: No  Social/Family/Support Systems Patient Roles: Spouse Contact Information: [phone] Anticipated Caregiver: Mourad Cwikla Ability/Limitations of Caregiver: Min A Caregiver Availability: 24/7 Is Caregiver In Agreement with Plan?: Yes Does Caregiver/Family have Issues with Lodging/Transportation while Pt is in Rehab?: Yes  Goals Patient/Family Goal for Rehab: PT/OT mod I Expected  length of stay: 7-10 days Pt/Family Agrees to Admission and willing to participate: Yes Program Orientation Provided & Reviewed with Pt/Caregiver Including Roles  & Responsibilities: Yes  Decrease burden of Care through IP rehab admission:  not anticipated  Possible need for SNF placement upon discharge: not anticipated   Patient Condition: I have reviewed medical records from [facility], spoken with CM, and patient. I met with patient at the bedside for inpatient rehabilitation assessment.  Patient will benefit from ongoing PT, OT, and SLP, can actively participate in 3 hours of therapy a day 5 days of the week, and can make measurable gains during the admission.  Patient will also benefit from the coordinated team approach during an Inpatient Acute Rehabilitation admission.  The patient will receive intensive therapy as well as Rehabilitation physician, nursing, social worker, and care management interventions.  Due to skin/wound care, disease management, medication administration, pain management, and patient education the patient requires 24 hour  a day rehabilitation nursing.  The patient is currently min A to CGA with mobility and basic ADLs.  Discharge setting and therapy post discharge at home with home health is anticipated.  Patient has agreed to participate in the Acute Inpatient Rehabilitation Program and will admit today.  Preadmission Screen Completed By:  Jeronimo Greaves, 05/02/2023 2:46 PM ______________________________________________________________________   Discussed status with Dr. Natale Lay on 05/05/23 at 900 and received approval for admission today.  Admission Coordinator:  Jeronimo Greaves, CCC-SLP, time 922/Date 05/05/23   Assessment/Plan: Diagnosis: Respiratory failure Does the need for close, 24 hr/day Medical supervision in concert with the patient's rehab needs make it unreasonable for this patient to be served in a less intensive setting? Yes Co-Morbidities  requiring supervision/potential complications: Pneumonia, COPD, non-small cell lung cancer, IPF, chronic Pulmonary embolism on xarelto, BPH Due to bladder management, bowel management, safety, skin/wound care, disease management, medication administration, pain management, and patient education, does the patient require 24 hr/day rehab nursing? Yes Does the patient require coordinated care of a physician, rehab nurse, PT, OT, and SLP to address physical and functional deficits in the context of the above medical diagnosis(es)? Yes Addressing deficits in the following areas: balance, endurance, locomotion, strength, transferring, bowel/bladder control, bathing, dressing, feeding, grooming, toileting, cognition, speech, language, swallowing, and psychosocial support Can the patient actively participate in an intensive therapy program of at least 3 hrs of therapy 5 days a week? Yes The potential for patient to make measurable gains while on inpatient rehab is excellent Anticipated functional outcomes upon discharge from inpatient rehab: modified independent PT, modified independent OT, n/a SLP Estimated rehab length of stay to reach the above functional goals is: 7-10 Anticipated discharge destination: Home 10. Overall Rehab/Functional Prognosis: excellent   MD Signature: Fanny Dance

## 2023-05-02 NOTE — Progress Notes (Signed)
 Mobility Specialist - Progress Note   05/02/23 1503  Oxygen Therapy  SpO2 (!) 82 %  O2 Device Nasal Cannula  O2 Flow Rate (L/min) 6 L/min  Patient Activity (if Appropriate) Ambulating  Mobility  Activity Ambulated independently in room  Level of Assistance Modified independent, requires aide device or extra time  Distance Ambulated (ft) 20 ft  Activity Response Tolerated well  Mobility Referral Yes  Mobility visit 1 Mobility  Mobility Specialist Stop Time (ACUTE ONLY) 1504   Pt received in bed and agreeable to mobility. Pt only able to walk around bed d/t SpO2 desaturation to 82%. Once in recliner, pt able to come back up to 91% in ~8min. No complaints during session. Pt SOB throughout session. Pt to recliner after session with all needs met.    Pre-mobility: 90% SpO2 (6L [place]) During mobility: 82% SpO2 (6L [place]) Post-mobility: 90% SPO2 (6L [place])  Chief Technology Officer

## 2023-05-02 NOTE — Plan of Care (Signed)
   Problem: Education: Goal: Knowledge of General Education information will improve Description: Including pain rating scale, medication(s)/side effects and non-pharmacologic comfort measures Outcome: Progressing   Problem: Activity: Goal: Risk for activity intolerance will decrease Outcome: Progressing   Problem: Nutrition: Goal: Adequate nutrition will be maintained Outcome: Progressing   Problem: Coping: Goal: Level of anxiety will decrease Outcome: Progressing

## 2023-05-03 DIAGNOSIS — J9621 Acute and chronic respiratory failure with hypoxia: Secondary | ICD-10-CM | POA: Diagnosis not present

## 2023-05-03 MED ORDER — BISACODYL 10 MG RE SUPP: 10.0000 mg | Freq: Every day | RECTAL | Status: DC | PRN

## 2023-05-03 MED ORDER — PREDNISONE 10 MG PO TABS: ORAL_TABLET | ORAL | Status: DC

## 2023-05-03 MED ORDER — SENNOSIDES-DOCUSATE SODIUM 8.6-50 MG PO TABS: 1.0000 | ORAL_TABLET | Freq: Every evening | ORAL | Status: DC | PRN

## 2023-05-03 MED ORDER — FUROSEMIDE 20 MG PO TABS
20.0000 mg | ORAL_TABLET | Freq: Once | ORAL | Status: AC
Start: 2023-05-03 — End: 2023-05-03
  Administered 2023-05-03: 20 mg via ORAL
  Filled 2023-05-03 (×2): qty 1

## 2023-05-03 NOTE — Progress Notes (Signed)
 Inpatient Rehab Admissions Coordinator:    CIR following. I will send his case to insurance once seen by PT/OT today.   Megan Salon, MS, CCC-SLP Rehab Admissions Coordinator  [phone] (celll) [phone] (office)

## 2023-05-03 NOTE — TOC Initial Note (Signed)
 Transition of Care [facility]) - Initial/Assessment Note   Patient Details  Name: Philip [name] MRN: [id number] Date of Birth: [birth year]  [facility] CM/SW Contact:    Ewing Schlein, LCSW Phone Number: 05/03/2023, 11:44 AM  Clinical Narrative: Patient is under review by CIR. TOC to follow.  Expected Discharge Plan: IP Rehab Facility Barriers to Discharge: Continued Medical Work up  Expected Discharge Plan and Services In-house Referral: Clinical Social Work Living arrangements for the past 2 months: Single Family Home Expected Discharge Date: 04/25/23               DME Arranged: N/A DME Agency: NA  Prior Living Arrangements/Services Living arrangements for the past 2 months: Single Family Home Lives with:: Spouse Patient language and need for interpreter reviewed:: Yes Need for Family Participation in Patient Care: Yes (Comment) Care giver support system in place?: Yes (comment) Criminal Activity/Legal Involvement Pertinent to Current Situation/Hospitalization: No - Comment as needed  Activities of Daily Living ADL Screening (condition at time of admission) Independently performs ADLs?: Yes (appropriate for developmental age) Is the patient deaf or have difficulty hearing?: Yes Does the patient have difficulty seeing, even when wearing glasses/contacts?: No Does the patient have difficulty concentrating, remembering, or making decisions?: No  Emotional Assessment Orientation: : Oriented to Self, Oriented to Place, Oriented to  Time, Oriented to Situation Psych Involvement: No (comment)  Admission diagnosis:  SOB (shortness of breath) [R06.02] COPD exacerbation (HCC) [J44.1] COPD with acute exacerbation (HCC) [J44.1] Patient Active Problem List   Diagnosis Date Noted   Class I obesity 04/22/2023   COPD exacerbation (HCC) 04/19/2023   History of lung cancer 04/18/2023   Radiation-induced pulmonary fibrosis (HCC) 04/18/2023   Chronic migraine without aura  04/18/2023   Hyperlipidemia 04/18/2023   COPD with acute exacerbation (HCC) 04/18/2023   CAP (community acquired pneumonia) 01/12/2022   Tobacco abuse 01/12/2022   BPH (benign prostatic hyperplasia) 01/12/2022   GERD (gastroesophageal reflux disease) 01/12/2022   COPD, moderate (HCC) 10/14/2021   Preoperative clearance 10/14/2021   Neutropenia (HCC) 01/29/2021   Recurrent non-small cell lung cancer (HCC) 01/01/2021   Pulmonary embolism (HCC) 03/14/2020   Healthcare maintenance 10/10/2019   Allergic rhinitis 10/10/2019   COPD (chronic obstructive pulmonary disease) (HCC) 06/19/2019   Pneumonia 06/19/2019   MAI (mycobacterium avium-intracellulare) infection (HCC) 06/19/2019   Acute on chronic hypoxic respiratory failure (HCC) 06/19/2019   Chronic pulmonary embolism 06/16/2019   Encounter for antineoplastic immunotherapy 04/26/2019   Odynophagia 03/13/2019   Adenocarcinoma of right lung, stage 3 (HCC) 01/13/2019   Goals of care, counseling/discussion 01/13/2019   Encounter for antineoplastic chemotherapy 01/13/2019   Tobacco abuse counseling 01/13/2019   Pneumothorax 01/09/2019   S/P bronchoscopy with biopsy    Lung mass 12/22/2018   S/P bronchoscopy    PCP:  Farris Has, MD Pharmacy:   [facility] [phone] - [place], [place] - [address] [place] [address] Phone: [phone] Fax: [phone]  [id number] - [place], [place] - [address] [facility] [address] [place] [address] Phone: [phone] Fax: [phone]  [place] [facility] [address] [place] [address] Phone: [phone] Fax: [phone]  Social Drivers of Health (SDOH) Social History: SDOH Screenings   Food Insecurity: No Food Insecurity (04/19/2023)  Housing: Low Risk  (04/19/2023)  Transportation Needs: No Transportation  Needs (04/19/2023)   Utilities: Not At Risk (04/19/2023)  Social Connections: Moderately Integrated (04/19/2023)  Tobacco Use: Medium Risk (04/18/2023)   SDOH Interventions:    Readmission Risk Interventions    01/22/2022   11:05 AM 01/14/2022   12:03 PM  Readmission Risk Prevention Plan  Transportation Screening Complete Complete  PCP or Specialist Appt within 5-7 Days Complete Complete  Home Care Screening Complete Complete  Medication Review (RN CM) Complete Complete

## 2023-05-03 NOTE — Progress Notes (Signed)
 I have reviewed and concur with this student's documentation.   Nikeya Maxim Futures trader, RN 05/03/2023 5:04 PM

## 2023-05-03 NOTE — Progress Notes (Signed)
 PROGRESS NOTE    Philip [name]  TKZ:[id number] DOB: 06/30/1955 DOA: 04/18/2023 PCP: Farris Has, MD    Brief Narrative:   68 year old with history of stage III adenocarcinoma of lung, PE on Xarelto, radiation-induced IPF, COPD, chronic hypoxia on 4-6 L nasal cannula, pulmonary MAI, bronchiectasis initially presented to the hospital on 3/16 for dyspnea, cough and tachycardia.  CTA of the chest was negative for PE but showed bilateral groundglass opacities and admitted to [facility] service.  Initial treatment was initiated for COPD exacerbation along with CAP on steroids, bronchodilators and Rocephin/azithromycin.  COVID, flu and RSV were negative.  Eventually cultures on 3/20 resulted as Pseudomonas therefore pulmonary was consulted and antibiotics were changed to cefepime.  Patient was also transitioned off IV steroids to p.o. prednisone.  Echocardiogram showed preserved EF.   Overall patient is doing well, transition to p.o. 2-week prednisone taper.  From respiratory standpoint patient is slowly improving but due to acute comorbidities and physical deconditioning, PT recommended CIR.  Inpatient rehab team consulted.  Assessment & Plan:  Principal Problem:   Acute on chronic hypoxic respiratory failure (HCC) Active Problems:   COPD (chronic obstructive pulmonary disease) (HCC)   Adenocarcinoma of right lung, stage 3 (HCC)   Chronic pulmonary embolism   BPH (benign prostatic hyperplasia)   History of lung cancer   Hyperlipidemia   Class I obesity   Acute respiratory distress, improved Acute on chronic hypoxia, improved -This is multifactorial in nature in the setting of Pseudomonas pneumonia and COPD exacerbation with possibly underlying worsening of radiation-induced fibrosis.  Pseudomonas pneumonia, stable - Initially treated with Rocephin and azithromycin and eventually transition to 7 days of cefepime which patient has completed.  Acute COPD exacerbation, stable - Acute on  chronic hypoxia - At baseline uses 4 to 6 L of nasal cannula.  Initially started on IV Solu-Medrol now has been transitioned to prednisone.  Echocardiogram shows preserved EF and has responded well to as needed diuretics.  Prednisone 40 mg, 2-week taper ordered  History of recurrent non-small cell lung cancer - Previously received chemotherapy and immunotherapy back in 2020 and 2022.  Followed by outpatient pulmonary team.  History of radiation induced IPF - Already on bronchodilators and steroids  History of pulmonary MAI Chronic pulmonary embolism - Patient currently is on Xarelto.  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  For now continue Flomax  Hold further lab draws unless needed.   DVT prophylaxis: Xarelto    Code Status: Full Code Family Communication: Sessile updated spouse Status is: Inpatient Remains inpatient appropriate because: Pending CIR placement   Subjective: Breathing improved no complaints Resting comfortably  Examination:  General exam: Appears calm and comfortable; 7L [place]  Respiratory system: Clear to auscultation. Respiratory effort normal. Cardiovascular system: S1 & S2 heard, RRR. No JVD, murmurs, rubs, gallops or clicks. No pedal edema. Gastrointestinal system: Abdomen is nondistended, soft and nontender. No organomegaly or masses felt. Normal bowel sounds heard. Central nervous system: Alert and oriented. No focal neurological deficits. Extremities: Symmetric 5 x 5 power. Skin: No rashes, lesions or ulcers Psychiatry: Judgement and insight appear normal. Mood & affect appropriate.                   Diet Orders (From admission, onward)     Start     Ordered   04/19/23 0910  Diet regular Fluid consistency: Thin  Diet effective now       Question:  Fluid  consistency:  Answer:  Thin   04/19/23 0909            Objective: Vitals:   05/02/23 1809 05/02/23 2029 05/03/23 0458  05/03/23 0842  BP:  129/79 102/81   Pulse:  (!) 108 88   Resp:  20 20   Temp:  97.6 F (36.4 C) 97.8 F (36.6 C)   TempSrc:  Oral Oral   SpO2: 97% 93% 94% 90%  Weight:      Height:        Intake/Output Summary (Last 24 hours) at 05/03/2023 1213 Last data filed at 05/03/2023 0030 Gross per 24 hour  Intake --  Output 1125 ml  Net -1125 ml   Filed Weights   04/19/23 1744  Weight: 90.7 kg    Scheduled Meds:  arformoterol  15 mcg Nebulization BID   budesonide (PULMICORT) nebulizer solution  0.5 mg Nebulization BID   furosemide  20 mg Oral Once   guaiFENesin  600 mg Oral BID   ipratropium-albuterol  3 mL Nebulization QID   polyethylene glycol  17 g Oral BID   predniSONE  30 mg Oral Q breakfast   Followed by   Melene Muller ON 05/07/2023] predniSONE  20 mg Oral Q breakfast   Followed by   Melene Muller ON 05/11/2023] predniSONE  10 mg Oral Q breakfast   rivaroxaban  20 mg Oral Q supper   senna-docusate  1 tablet Oral QHS   sodium chloride flush  3 mL Intravenous Q12H   sodium chloride flush  3 mL Intravenous Q12H   tamsulosin  0.4 mg Oral QHS   Continuous Infusions:  Nutritional status     Body mass index is 31.32 kg/m.  Data Reviewed:   CBC: Recent Labs  Lab 04/28/23 0419 04/29/23 0407 04/30/23 0340 05/01/23 0438 05/02/23 0350  WBC 10.5 11.3* 13.9* 12.0* 12.3*  HGB 11.7* 11.3* 12.1* 11.1* 11.8*  HCT 38.3* 36.4* 40.2 37.0* 39.3  MCV 96.0 95.5 97.3 98.4 97.0  PLT 226 212 219 185 200   Basic Metabolic Panel: Recent Labs  Lab 04/28/23 0419 04/29/23 0407 04/30/23 0340 05/01/23 0438 05/02/23 0350  NA 140 137 138 138 143  K 4.0 4.1 4.3 4.0 5.0  CL 97* 98 98 99 98  CO2 34* 31 29 31  36*  GLUCOSE 113* 100* 122* 117* 99  BUN 28* 28* 27* 27* 29*  CREATININE 1.05 0.86 0.74 0.82 1.00  CALCIUM 8.4* 8.5* 8.8* 8.5* 9.1  PHOS  --   --   --  3.7  --    GFR: Estimated Creatinine Clearance: 77 mL/min (by C-G formula based on SCr of 1 mg/dL). Liver Function Tests: Recent Labs   Lab 04/28/23 0419  AST 20  ALT 26  ALKPHOS 50  BILITOT 0.4  PROT 5.5*  ALBUMIN 2.7*   No results for input(s): "LIPASE", "AMYLASE" in the last 168 hours. No results for input(s): "AMMONIA" in the last 168 hours. Coagulation Profile: No results for input(s): "INR", "PROTIME" in the last 168 hours. Cardiac Enzymes: No results for input(s): "CKTOTAL", "CKMB", "CKMBINDEX", "TROPONINI" in the last 168 hours. BNP (last 3 results) No results for input(s): "PROBNP" in the last 8760 hours. HbA1C: No results for input(s): "HGBA1C" in the last 72 hours. CBG: No results for input(s): "GLUCAP" in the last 168 hours. Lipid Profile: No results for input(s): "CHOL", "HDL", "LDLCALC", "TRIG", "CHOLHDL", "LDLDIRECT" in the last 72 hours. Thyroid Function Tests: No results for input(s): "TSH", "T4TOTAL", "FREET4", "T3FREE", "THYROIDAB" in the last  72 hours. Anemia Panel: No results for input(s): "VITAMINB12", "FOLATE", "FERRITIN", "TIBC", "IRON", "RETICCTPCT" in the last 72 hours. Sepsis Labs: No results for input(s): "PROCALCITON", "LATICACIDVEN" in the last 168 hours.  Recent Results (from the past 240 hours)  Acid Fast Smear (AFB)     Status: None   Collection Time: 04/28/23  5:19 PM   Specimen: Sputum; Respiratory  Result Value Ref Range Status   AFB Specimen Processing Concentration  Final   Acid Fast Smear Negative  Final    Comment: (NOTE) Performed At: [facility] [address] [place], [place] [id number] Jolene Schimke MD MV:[id number]    Source (AFB) SPUTUM  Final    Comment: Performed at [facility], [address] [place], [place] [address]         Radiology Studies: No results found.         LOS: 14 days   Time spent= 35 mins    Miguel Rota, MD [facility]  If 7PM-7AM, please contact night-coverage  05/03/2023, 12:13 PM

## 2023-05-03 NOTE — Progress Notes (Signed)
 RT Note:  CPT flutter held. Patient asleep.

## 2023-05-03 NOTE — Progress Notes (Signed)
 Physical Therapy Treatment Patient Details Name: Philip [name] MRN: [id number] DOB: [birth year] Today's Date: 05/03/2023   History of Present Illness Pt is 68 yo male admitted on 04/18/23 with acute on chronic resp failure in setting of pseudomonas PNE , COPD exacerbation with possibly underlying worsening of radiation-induced fibrosis.  Pt with hx including but not limited to stage III adenocarcinoma of lung, PE on Xarelto, radiation-induced IPF, COPD, chronic hypoxia on 4-6 L nasal cannula, pulmonary MAI, bronchiectasis    PT Comments  Pt motivated and able to increase gait today but requires frequent rest breaks.  His O2 sats still decreasing with activity but recovered quicker today (1.5 min compared to 3 min to recover).  Session today focused on multiple bouts of ambulation to increase endurance, also educated on AROM exercises. Continue POC.  Patient will benefit from intensive inpatient follow-up therapy, >3 hours/day    Pt was on 8 L O2 today with sats 91% rest.  He would drop to 78% with ambulation but recovered to 90% in 1.5 mins.  Pt has delayed response in O2 drop.  When he would stop ambulating was at 88% but then dropped as low as 78% during recovery typically.  However, on last bout, pt with increased fatigue, dropped to 74% , required increase to 10 L to recover.  Then decreased back to 8 L gradually with sats 90% at end of session.    If plan is discharge home, recommend the following: Assistance with cooking/housework;A little help with bathing/dressing/bathroom;A little help with walking and/or transfers;Help with stairs or ramp for entrance   Can travel by private vehicle        Equipment Recommendations  Rollator (4 wheels)    Recommendations for Other Services       Precautions / Restrictions Precautions Precautions: Fall Precaution/Restrictions Comments: Monitor O2     Mobility  Bed Mobility Overal bed mobility: Needs Assistance Bed Mobility: Supine to Sit,  Sit to Supine     Supine to sit: Supervision Sit to supine: Supervision        Transfers Overall transfer level: Needs assistance Equipment used: None Transfers: Sit to/from Stand Sit to Stand: Contact guard assist           General transfer comment: Performed STS x 8 during session    Ambulation/Gait Ambulation/Gait assistance: Contact guard assist Gait Distance (Feet): 30 Feet (30x6) Assistive device:  (O2 tank) Gait Pattern/deviations: Step-through pattern Gait velocity: decreased     General Gait Details: Pt ambulating 30' x 6 with chair follow and pushing O2 tank.  He required 3 min rest breaks between each bout.  See below for O2 sats.   Stairs             Wheelchair Mobility     Tilt Bed    Modified Rankin (Stroke Patients Only)       Balance Overall balance assessment: Needs assistance Sitting-balance support: No upper extremity supported Sitting balance-Leahy Scale: Good     Standing balance support: No upper extremity supported Standing balance-Leahy Scale: Good                              Communication    Cognition Arousal: Alert Behavior During Therapy: WFL for tasks assessed/performed   PT - Cognitive impairments: No apparent impairments  Cueing    Exercises Other Exercises Other Exercises: Scapular retraction x 5 to help take deeper breaths Other Exercises: Encouraged AROM exercises in bed as able.  Pt demonstrating LE AROM and UE that he performs.    General Comments        Pertinent Vitals/Pain Pain Assessment Pain Assessment: Faces Faces Pain Scale: Hurts a little bit Pain Location: reports pain from ureteral stent Pain Descriptors / Indicators: Discomfort Pain Intervention(s): Limited activity within patient's tolerance, Monitored during session    Home Living                          Prior Function            PT Goals (current goals can  now be found in the care plan section) Progress towards PT goals: Progressing toward goals    Frequency    Min 2X/week      PT Plan      Co-evaluation              AM-PAC PT "6 Clicks" Mobility   Outcome Measure  Help needed turning from your back to your side while in a flat bed without using bedrails?: None Help needed moving from lying on your back to sitting on the side of a flat bed without using bedrails?: None Help needed moving to and from a bed to a chair (including a wheelchair)?: A Little Help needed standing up from a chair using your arms (e.g., wheelchair or bedside chair)?: A Little Help needed to walk in hospital room?: A Little Help needed climbing 3-5 steps with a railing? : A Little 6 Click Score: 20    End of Session Equipment Utilized During Treatment: Oxygen Activity Tolerance: Patient tolerated treatment well Patient left: in bed;with call bell/phone within reach;with family/visitor present Nurse Communication: Mobility status PT Visit Diagnosis: Other abnormalities of gait and mobility (R26.89);Muscle weakness (generalized) (M62.81)     Time: 1610-9604 PT Time Calculation (min) (ACUTE ONLY): 42 min  Charges:    $Gait Training: 8-22 mins $Therapeutic Activity: 8-22 mins PT General Charges $$ ACUTE PT VISIT: 1 Visit                     Anise Salvo, PT Acute Rehab [facility] [phone]    Rayetta Humphrey 05/03/2023, 12:46 PM

## 2023-05-03 NOTE — Progress Notes (Signed)
 Occupational Therapy Treatment Patient Details Name: Philip [name] MRN: [id number] DOB: Feb 10, 1955 Today's Date: 05/03/2023   History of present illness Pt is 68 yo male admitted on 04/18/23 with acute on chronic resp failure in setting of pseudomonas PNE , COPD exacerbation with possibly underlying worsening of radiation-induced fibrosis.  Pt with hx including but not limited to stage III adenocarcinoma of lung, PE on Xarelto, radiation-induced IPF, COPD, chronic hypoxia on 4-6 L nasal cannula, pulmonary MAI, bronchiectasis   OT comments  Patient is motivated and showed carryover of education provided during last OT session. Patient was able to engage in AE for LB Dressing/bathing tasks sitting EOB with noted to have O2 drop to 84% on 8L/min with tasks. Patient was able to deep breath back up within 2 mins. Patient continues to have decreased functional activity tolerance and decreased standing balance impacting participation in ADLs. Plan to work on O2 cord management during next session. Patient will benefit from intensive inpatient follow-up therapy, >3 hours/day.       If plan is discharge home, recommend the following:  A little help with walking and/or transfers;A lot of help with bathing/dressing/bathroom;Assistance with cooking/housework;Direct supervision/assist for medications management;Assist for transportation;Help with stairs or ramp for entrance;Direct supervision/assist for financial management   Equipment Recommendations  None recommended by OT       Precautions / Restrictions Precautions Precautions: Fall Precaution/Restrictions Comments: Monitor O2 Restrictions Weight Bearing Restrictions Per Provider Order: No       Mobility Bed Mobility Overal bed mobility: Needs Assistance Bed Mobility: Supine to Sit, Sit to Supine     Supine to sit: Supervision Sit to supine: Supervision             Balance Overall balance assessment: Needs  assistance Sitting-balance support: No upper extremity supported Sitting balance-Leahy Scale: Good           ADL either performed or assessed with clinical judgement   ADL Overall ADL's : Needs assistance/impaired                     Lower Body Dressing: Sitting/lateral leans;Minimal assistance Lower Body Dressing Details (indicate cue type and reason): educated on LB Dressing AE to reduce SOB. patient was able to demonstrate understanding with reacher and sock aid for simulated pants, and don/doff socks. Toilet Transfer: Minimal assistance             General ADL Comments: patient was noted to drop to 84% on 8L/min with activity with 2 mins of breathign to bring back up to 90s during session. patient showing carryover from last session with not holding breath with challanging tasks majoirty of session.      Cognition Arousal: Alert Behavior During Therapy: WFL for tasks assessed/performed Cognition: No apparent impairments                          Pertinent Vitals/ Pain       Pain Assessment Pain Assessment: Faces Faces Pain Scale: Hurts a little bit Pain Location: reports pain from ureteral stent Pain Descriptors / Indicators: Discomfort Pain Intervention(s): Limited activity within patient's tolerance, Monitored during session         Frequency  Min 2X/week        Progress Toward Goals  OT Goals(current goals can now be found in the care plan section)  Progress towards OT goals: Progressing toward goals     Plan  AM-PAC OT "6 Clicks" Daily Activity     Outcome Measure   Help from another person eating meals?: None Help from another person taking care of personal grooming?: A Little Help from another person toileting, which includes using toliet, bedpan, or urinal?: A Lot Help from another person bathing (including washing, rinsing, drying)?: A Lot Help from another person to put on and taking off regular upper body clothing?:  A Little Help from another person to put on and taking off regular lower body clothing?: A Lot 6 Click Score: 16    End of Session Equipment Utilized During Treatment: Gait belt;Oxygen  OT Visit Diagnosis: Unsteadiness on feet (R26.81);Other abnormalities of gait and mobility (R26.89);Pain   Activity Tolerance Patient tolerated treatment well   Patient Left with call bell/phone within reach;in bed   Nurse Communication          Time: 0981-1914 OT Time Calculation (min): 16 min  Charges: OT General Charges $OT Visit: 1 Visit OT Treatments $Self Care/Home Management : 8-22 mins  Rosalio Loud, MS Acute Rehabilitation Department Office# [phone]   Selinda Flavin 05/03/2023, 3:29 PM

## 2023-05-04 DIAGNOSIS — J9621 Acute and chronic respiratory failure with hypoxia: Secondary | ICD-10-CM | POA: Diagnosis not present

## 2023-05-04 NOTE — Progress Notes (Signed)
 Inpatient Rehab Admissions Coordinator:     I await insurance auth for CIR. I will update when a decision is rendered.   Megan Salon, MS, CCC-SLP Rehab Admissions Coordinator  [phone] (celll) [phone] (office)

## 2023-05-04 NOTE — Progress Notes (Signed)
 PROGRESS NOTE    Philip [name]  NAT:[id number] DOB: Jan 01, 1956 DOA: 04/18/2023 PCP: Farris Has, MD    Brief Narrative:   68 year old with history of stage III adenocarcinoma of lung, PE on Xarelto, radiation-induced IPF, COPD, chronic hypoxia on 4-6 L nasal cannula, pulmonary MAI, bronchiectasis initially presented to the hospital on 3/16 for dyspnea, cough and tachycardia.  CTA of the chest was negative for PE but showed bilateral groundglass opacities and admitted to [facility] service.  Initial treatment was initiated for COPD exacerbation along with CAP on steroids, bronchodilators and Rocephin/azithromycin.  COVID, flu and RSV were negative.  Eventually cultures on 3/20 resulted as Pseudomonas therefore pulmonary was consulted and antibiotics were changed to cefepime.  Patient was also transitioned off IV steroids to p.o. prednisone.  Echocardiogram showed preserved EF.   Overall patient is doing well, transition to p.o. 2-week prednisone taper.  From respiratory standpoint patient is slowly improving but due to acute comorbidities and physical deconditioning, PT recommended CIR.  Inpatient rehab team consulted.  Assessment & Plan:  Principal Problem:   Acute on chronic hypoxic respiratory failure (HCC) Active Problems:   COPD (chronic obstructive pulmonary disease) (HCC)   Adenocarcinoma of right lung, stage 3 (HCC)   Chronic pulmonary embolism   BPH (benign prostatic hyperplasia)   History of lung cancer   Hyperlipidemia   Class I obesity   Acute respiratory distress, improved Acute on chronic hypoxia, improved -This is multifactorial in nature in the setting of Pseudomonas pneumonia and COPD exacerbation with possibly underlying worsening of radiation-induced fibrosis.  Pseudomonas pneumonia, stable - Initially treated with Rocephin and azithromycin and eventually transition to 7 days of cefepime which patient has completed.  Acute COPD exacerbation, stable - Acute on  chronic hypoxia - At baseline uses 4 to 6 L of nasal cannula.  Initially started on IV Solu-Medrol now has been transitioned to prednisone.  Echocardiogram shows preserved EF and has responded well to as needed diuretics.  Prednisone 40 mg, 2-week taper ordered  History of recurrent non-small cell lung cancer - Previously received chemotherapy and immunotherapy back in 2020 and 2022.  Followed by outpatient pulmonary team.  History of radiation induced IPF - Already on bronchodilators and steroids  History of pulmonary MAI Chronic pulmonary embolism - Patient currently is on Xarelto.  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  For now continue Flomax  Hold further lab draws unless needed.   DVT prophylaxis: Xarelto    Code Status: Full Code Family Communication: Sessile updated spouse Status is: Inpatient Remains inpatient appropriate because: Pending CIR placement   Subjective: Seen at bedside no complaints.  Examination:  General exam: Appears calm and comfortable; 7L [place]  Respiratory system: Clear to auscultation. Respiratory effort normal. Cardiovascular system: S1 & S2 heard, RRR. No JVD, murmurs, rubs, gallops or clicks. No pedal edema. Gastrointestinal system: Abdomen is nondistended, soft and nontender. No organomegaly or masses felt. Normal bowel sounds heard. Central nervous system: Alert and oriented. No focal neurological deficits. Extremities: Symmetric 5 x 5 power. Skin: No rashes, lesions or ulcers Psychiatry: Judgement and insight appear normal. Mood & affect appropriate.                   Diet Orders (From admission, onward)     Start     Ordered   04/19/23 0910  Diet regular Fluid consistency: Thin  Diet effective now       Question:  Fluid consistency:  Answer:  Thin   04/19/23 0909            Objective: Vitals:   05/03/23 2041 05/04/23 0423 05/04/23 0759 05/04/23 1204  BP:  117/86 103/78  111/76  Pulse: (!) 107 91  (!) 101  Resp: 20 18  19   Temp: 98.5 F (36.9 C) 97.6 F (36.4 C)  98 F (36.7 C)  TempSrc: Oral Oral    SpO2: 95% 97% 96% 95%  Weight:      Height:        Intake/Output Summary (Last 24 hours) at 05/04/2023 1242 Last data filed at 05/04/2023 1200 Gross per 24 hour  Intake 600 ml  Output 2450 ml  Net -1850 ml   Filed Weights   04/19/23 1744  Weight: 90.7 kg    Scheduled Meds:  arformoterol  15 mcg Nebulization BID   budesonide (PULMICORT) nebulizer solution  0.5 mg Nebulization BID   guaiFENesin  600 mg Oral BID   ipratropium-albuterol  3 mL Nebulization QID   polyethylene glycol  17 g Oral BID   predniSONE  30 mg Oral Q breakfast   Followed by   Melene Muller ON 05/07/2023] predniSONE  20 mg Oral Q breakfast   Followed by   Melene Muller ON 05/11/2023] predniSONE  10 mg Oral Q breakfast   rivaroxaban  20 mg Oral Q supper   senna-docusate  1 tablet Oral QHS   sodium chloride flush  3 mL Intravenous Q12H   sodium chloride flush  3 mL Intravenous Q12H   tamsulosin  0.4 mg Oral QHS   Continuous Infusions:  Nutritional status     Body mass index is 31.32 kg/m.  Data Reviewed:   CBC: Recent Labs  Lab 04/28/23 0419 04/29/23 0407 04/30/23 0340 05/01/23 0438 05/02/23 0350  WBC 10.5 11.3* 13.9* 12.0* 12.3*  HGB 11.7* 11.3* 12.1* 11.1* 11.8*  HCT 38.3* 36.4* 40.2 37.0* 39.3  MCV 96.0 95.5 97.3 98.4 97.0  PLT 226 212 219 185 200   Basic Metabolic Panel: Recent Labs  Lab 04/28/23 0419 04/29/23 0407 04/30/23 0340 05/01/23 0438 05/02/23 0350  NA 140 137 138 138 143  K 4.0 4.1 4.3 4.0 5.0  CL 97* 98 98 99 98  CO2 34* 31 29 31  36*  GLUCOSE 113* 100* 122* 117* 99  BUN 28* 28* 27* 27* 29*  CREATININE 1.05 0.86 0.74 0.82 1.00  CALCIUM 8.4* 8.5* 8.8* 8.5* 9.1  PHOS  --   --   --  3.7  --    GFR: Estimated Creatinine Clearance: 77 mL/min (by C-G formula based on SCr of 1 mg/dL). Liver Function Tests: Recent Labs  Lab  04/28/23 0419  AST 20  ALT 26  ALKPHOS 50  BILITOT 0.4  PROT 5.5*  ALBUMIN 2.7*   No results for input(s): "LIPASE", "AMYLASE" in the last 168 hours. No results for input(s): "AMMONIA" in the last 168 hours. Coagulation Profile: No results for input(s): "INR", "PROTIME" in the last 168 hours. Cardiac Enzymes: No results for input(s): "CKTOTAL", "CKMB", "CKMBINDEX", "TROPONINI" in the last 168 hours. BNP (last 3 results) No results for input(s): "PROBNP" in the last 8760 hours. HbA1C: No results for input(s): "HGBA1C" in the last 72 hours. CBG: No results for input(s): "GLUCAP" in the last 168 hours. Lipid Profile: No results for input(s): "CHOL", "HDL", "LDLCALC", "TRIG", "CHOLHDL", "LDLDIRECT" in the last 72 hours. Thyroid Function Tests: No results for input(s): "TSH", "T4TOTAL", "FREET4", "T3FREE", "THYROIDAB" in the last 72 hours. Anemia Panel: No  results for input(s): "VITAMINB12", "FOLATE", "FERRITIN", "TIBC", "IRON", "RETICCTPCT" in the last 72 hours. Sepsis Labs: No results for input(s): "PROCALCITON", "LATICACIDVEN" in the last 168 hours.  Recent Results (from the past 240 hours)  Acid Fast Smear (AFB)     Status: None   Collection Time: 04/28/23  5:19 PM   Specimen: Sputum; Respiratory  Result Value Ref Range Status   AFB Specimen Processing Concentration  Final   Acid Fast Smear Negative  Final    Comment: (NOTE) Performed At: [facility] [address] [place], [place] [id number] Jolene Schimke MD OV:[id number]    Source (AFB) SPUTUM  Final    Comment: Performed at [facility], [address], [place], [place] [address]         Radiology Studies: No results found.         LOS: 15 days   Time spent= 35 mins    Miguel Rota, MD [facility]  If 7PM-7AM, please contact night-coverage  05/04/2023, 12:42 PM

## 2023-05-05 ENCOUNTER — Encounter (HOSPITAL_COMMUNITY): Payer: Self-pay | Admitting: Physical Medicine & Rehabilitation

## 2023-05-05 ENCOUNTER — Other Ambulatory Visit: Payer: Self-pay

## 2023-05-05 ENCOUNTER — Telehealth: Payer: Self-pay | Admitting: Internal Medicine

## 2023-05-05 ENCOUNTER — Inpatient Hospital Stay (HOSPITAL_COMMUNITY)
Admission: AD | Admit: 2023-05-05 | Discharge: 2023-05-19 | DRG: 189 | Disposition: A | Discharge status: Other Acute Inpatient Hospital | Source: Intra-hospital | Attending: Physical Medicine & Rehabilitation | Admitting: Physical Medicine & Rehabilitation

## 2023-05-05 ENCOUNTER — Inpatient Hospital Stay: Admitting: Pulmonary Disease

## 2023-05-05 DIAGNOSIS — Z923 Personal history of irradiation: Secondary | ICD-10-CM

## 2023-05-05 DIAGNOSIS — Z86711 Personal history of pulmonary embolism: Secondary | ICD-10-CM | POA: Diagnosis not present

## 2023-05-05 DIAGNOSIS — R5381 Other malaise: Secondary | ICD-10-CM | POA: Diagnosis not present

## 2023-05-05 DIAGNOSIS — J90 Pleural effusion, not elsewhere classified: Secondary | ICD-10-CM | POA: Diagnosis not present

## 2023-05-05 DIAGNOSIS — M545 Low back pain, unspecified: Secondary | ICD-10-CM | POA: Diagnosis present

## 2023-05-05 DIAGNOSIS — G893 Neoplasm related pain (acute) (chronic): Secondary | ICD-10-CM

## 2023-05-05 DIAGNOSIS — K219 Gastro-esophageal reflux disease without esophagitis: Secondary | ICD-10-CM | POA: Diagnosis present

## 2023-05-05 DIAGNOSIS — J9621 Acute and chronic respiratory failure with hypoxia: Principal | ICD-10-CM | POA: Diagnosis present

## 2023-05-05 DIAGNOSIS — J441 Chronic obstructive pulmonary disease with (acute) exacerbation: Secondary | ICD-10-CM | POA: Diagnosis present

## 2023-05-05 DIAGNOSIS — I2781 Cor pulmonale (chronic): Secondary | ICD-10-CM | POA: Diagnosis not present

## 2023-05-05 DIAGNOSIS — Z515 Encounter for palliative care: Secondary | ICD-10-CM

## 2023-05-05 DIAGNOSIS — N400 Enlarged prostate without lower urinary tract symptoms: Secondary | ICD-10-CM | POA: Diagnosis present

## 2023-05-05 DIAGNOSIS — R601 Generalized edema: Secondary | ICD-10-CM | POA: Diagnosis not present

## 2023-05-05 DIAGNOSIS — D72829 Elevated white blood cell count, unspecified: Secondary | ICD-10-CM | POA: Diagnosis not present

## 2023-05-05 DIAGNOSIS — Z66 Do not resuscitate: Secondary | ICD-10-CM | POA: Diagnosis not present

## 2023-05-05 DIAGNOSIS — J439 Emphysema, unspecified: Secondary | ICD-10-CM | POA: Diagnosis present

## 2023-05-05 DIAGNOSIS — R310 Gross hematuria: Secondary | ICD-10-CM | POA: Diagnosis not present

## 2023-05-05 DIAGNOSIS — J440 Chronic obstructive pulmonary disease with acute lower respiratory infection: Secondary | ICD-10-CM | POA: Diagnosis present

## 2023-05-05 DIAGNOSIS — J151 Pneumonia due to Pseudomonas: Secondary | ICD-10-CM | POA: Diagnosis present

## 2023-05-05 DIAGNOSIS — R202 Paresthesia of skin: Secondary | ICD-10-CM

## 2023-05-05 DIAGNOSIS — J9600 Acute respiratory failure, unspecified whether with hypoxia or hypercapnia: Principal | ICD-10-CM | POA: Diagnosis present

## 2023-05-05 DIAGNOSIS — E875 Hyperkalemia: Secondary | ICD-10-CM | POA: Diagnosis present

## 2023-05-05 DIAGNOSIS — M7989 Other specified soft tissue disorders: Secondary | ICD-10-CM | POA: Diagnosis present

## 2023-05-05 DIAGNOSIS — Z9981 Dependence on supplemental oxygen: Secondary | ICD-10-CM | POA: Diagnosis not present

## 2023-05-05 DIAGNOSIS — R609 Edema, unspecified: Secondary | ICD-10-CM | POA: Diagnosis not present

## 2023-05-05 DIAGNOSIS — C3491 Malignant neoplasm of unspecified part of right bronchus or lung: Secondary | ICD-10-CM | POA: Diagnosis not present

## 2023-05-05 DIAGNOSIS — E876 Hypokalemia: Secondary | ICD-10-CM

## 2023-05-05 DIAGNOSIS — H9190 Unspecified hearing loss, unspecified ear: Secondary | ICD-10-CM | POA: Diagnosis present

## 2023-05-05 DIAGNOSIS — G629 Polyneuropathy, unspecified: Secondary | ICD-10-CM | POA: Diagnosis present

## 2023-05-05 DIAGNOSIS — Z1152 Encounter for screening for COVID-19: Secondary | ICD-10-CM

## 2023-05-05 DIAGNOSIS — Z87891 Personal history of nicotine dependence: Secondary | ICD-10-CM

## 2023-05-05 DIAGNOSIS — E785 Hyperlipidemia, unspecified: Secondary | ICD-10-CM | POA: Diagnosis present

## 2023-05-05 DIAGNOSIS — K5900 Constipation, unspecified: Secondary | ICD-10-CM

## 2023-05-05 DIAGNOSIS — I2609 Other pulmonary embolism with acute cor pulmonale: Secondary | ICD-10-CM | POA: Diagnosis not present

## 2023-05-05 DIAGNOSIS — J701 Chronic and other pulmonary manifestations due to radiation: Secondary | ICD-10-CM | POA: Diagnosis present

## 2023-05-05 DIAGNOSIS — G8929 Other chronic pain: Secondary | ICD-10-CM | POA: Diagnosis present

## 2023-05-05 DIAGNOSIS — J449 Chronic obstructive pulmonary disease, unspecified: Secondary | ICD-10-CM

## 2023-05-05 DIAGNOSIS — I129 Hypertensive chronic kidney disease with stage 1 through stage 4 chronic kidney disease, or unspecified chronic kidney disease: Secondary | ICD-10-CM | POA: Diagnosis present

## 2023-05-05 DIAGNOSIS — Y842 Radiological procedure and radiotherapy as the cause of abnormal reaction of the patient, or of later complication, without mention of misadventure at the time of the procedure: Secondary | ICD-10-CM | POA: Diagnosis present

## 2023-05-05 DIAGNOSIS — J9601 Acute respiratory failure with hypoxia: Secondary | ICD-10-CM | POA: Diagnosis not present

## 2023-05-05 DIAGNOSIS — J479 Bronchiectasis, uncomplicated: Secondary | ICD-10-CM | POA: Diagnosis present

## 2023-05-05 DIAGNOSIS — Z85118 Personal history of other malignant neoplasm of bronchus and lung: Secondary | ICD-10-CM

## 2023-05-05 DIAGNOSIS — C3490 Malignant neoplasm of unspecified part of unspecified bronchus or lung: Secondary | ICD-10-CM

## 2023-05-05 DIAGNOSIS — M546 Pain in thoracic spine: Secondary | ICD-10-CM | POA: Diagnosis not present

## 2023-05-05 DIAGNOSIS — Z7901 Long term (current) use of anticoagulants: Secondary | ICD-10-CM | POA: Diagnosis not present

## 2023-05-05 DIAGNOSIS — Z800 Family history of malignant neoplasm of digestive organs: Secondary | ICD-10-CM

## 2023-05-05 DIAGNOSIS — Z7952 Long term (current) use of systemic steroids: Secondary | ICD-10-CM | POA: Diagnosis not present

## 2023-05-05 DIAGNOSIS — R000 Tachycardia, unspecified: Secondary | ICD-10-CM | POA: Diagnosis present

## 2023-05-05 DIAGNOSIS — G43909 Migraine, unspecified, not intractable, without status migrainosus: Secondary | ICD-10-CM | POA: Diagnosis present

## 2023-05-05 DIAGNOSIS — F419 Anxiety disorder, unspecified: Secondary | ICD-10-CM | POA: Diagnosis present

## 2023-05-05 DIAGNOSIS — Z9221 Personal history of antineoplastic chemotherapy: Secondary | ICD-10-CM

## 2023-05-05 DIAGNOSIS — K5901 Slow transit constipation: Secondary | ICD-10-CM | POA: Diagnosis not present

## 2023-05-05 MED ORDER — BISACODYL 10 MG RE SUPP: 10.0000 mg | Freq: Every day | RECTAL | Status: DC | PRN

## 2023-05-05 MED ORDER — OXYCODONE HCL 5 MG/5ML PO SOLN
7.5000 mg | ORAL | Status: DC | PRN
  Administered 2023-05-05: 7.5 mg via ORAL
  Filled 2023-05-05: qty 10

## 2023-05-05 MED ORDER — ARFORMOTEROL TARTRATE 15 MCG/2ML IN NEBU
15.0000 ug | INHALATION_SOLUTION | Freq: Two times a day (BID) | RESPIRATORY_TRACT | Status: DC
  Administered 2023-05-05 – 2023-05-19 (×28): 15 ug via RESPIRATORY_TRACT
  Filled 2023-05-05 (×27): qty 2

## 2023-05-05 MED ORDER — GUAIFENESIN ER 600 MG PO TB12
600.0000 mg | ORAL_TABLET | Freq: Two times a day (BID) | ORAL | Status: DC
  Administered 2023-05-05 – 2023-05-19 (×28): 600 mg via ORAL
  Filled 2023-05-05 (×28): qty 1

## 2023-05-05 MED ORDER — SENNOSIDES-DOCUSATE SODIUM 8.6-50 MG PO TABS
1.0000 | ORAL_TABLET | Freq: Every day | ORAL | Status: DC
  Administered 2023-05-06 – 2023-05-18 (×13): 1 via ORAL
  Filled 2023-05-05 (×14): qty 1

## 2023-05-05 MED ORDER — PREDNISONE 10 MG PO TABS
10.0000 mg | ORAL_TABLET | Freq: Every day | ORAL | Status: DC
  Administered 2023-05-11 – 2023-05-13 (×3): 10 mg via ORAL
  Filled 2023-05-05 (×3): qty 1

## 2023-05-05 MED ORDER — IPRATROPIUM-ALBUTEROL 0.5-2.5 (3) MG/3ML IN SOLN: 3.0000 mL | RESPIRATORY_TRACT | Status: DC | PRN

## 2023-05-05 MED ORDER — IBUPROFEN 400 MG PO TABS
400.0000 mg | ORAL_TABLET | Freq: Four times a day (QID) | ORAL | Status: DC | PRN
  Administered 2023-05-10: 400 mg via ORAL
  Filled 2023-05-05 (×2): qty 1

## 2023-05-05 MED ORDER — IPRATROPIUM-ALBUTEROL 0.5-2.5 (3) MG/3ML IN SOLN
3.0000 mL | Freq: Four times a day (QID) | RESPIRATORY_TRACT | Status: DC
  Administered 2023-05-05 – 2023-05-10 (×19): 3 mL via RESPIRATORY_TRACT
  Filled 2023-05-05 (×19): qty 3

## 2023-05-05 MED ORDER — GUAIFENESIN-DM 100-10 MG/5ML PO SYRP: 5.0000 mL | ORAL_SOLUTION | ORAL | Status: DC | PRN

## 2023-05-05 MED ORDER — PREDNISONE 20 MG PO TABS
30.0000 mg | ORAL_TABLET | Freq: Every day | ORAL | Status: AC
  Administered 2023-05-06: 30 mg via ORAL
  Filled 2023-05-05: qty 1

## 2023-05-05 MED ORDER — TAMSULOSIN HCL 0.4 MG PO CAPS
0.4000 mg | ORAL_CAPSULE | Freq: Every day | ORAL | Status: DC
  Administered 2023-05-05 – 2023-05-18 (×14): 0.4 mg via ORAL
  Filled 2023-05-05 (×14): qty 1

## 2023-05-05 MED ORDER — OXYCODONE HCL 5 MG/5ML PO SOLN: 7.5000 mg | ORAL | Status: DC | PRN

## 2023-05-05 MED ORDER — HYDROCODONE-ACETAMINOPHEN 5-325 MG PO TABS
1.0000 | ORAL_TABLET | Freq: Two times a day (BID) | ORAL | Status: DC | PRN
  Administered 2023-05-05 – 2023-05-10 (×8): 1 via ORAL
  Filled 2023-05-05 (×9): qty 1

## 2023-05-05 MED ORDER — ONDANSETRON HCL 4 MG/2ML IJ SOLN: 4.0000 mg | Freq: Four times a day (QID) | INTRAMUSCULAR | Status: DC | PRN

## 2023-05-05 MED ORDER — ONDANSETRON HCL 4 MG PO TABS: 4.0000 mg | ORAL_TABLET | Freq: Four times a day (QID) | ORAL | Status: DC | PRN

## 2023-05-05 MED ORDER — BUDESONIDE 0.5 MG/2ML IN SUSP
0.5000 mg | Freq: Two times a day (BID) | RESPIRATORY_TRACT | Status: DC
  Administered 2023-05-05 – 2023-05-19 (×28): 0.5 mg via RESPIRATORY_TRACT
  Filled 2023-05-05 (×28): qty 2

## 2023-05-05 MED ORDER — RIVAROXABAN 20 MG PO TABS
20.0000 mg | ORAL_TABLET | Freq: Every day | ORAL | Status: DC
  Administered 2023-05-05 – 2023-05-18 (×14): 20 mg via ORAL
  Filled 2023-05-05 (×15): qty 1

## 2023-05-05 MED ORDER — PREDNISONE 20 MG PO TABS
20.0000 mg | ORAL_TABLET | Freq: Every day | ORAL | Status: AC
  Administered 2023-05-07 – 2023-05-10 (×4): 20 mg via ORAL
  Filled 2023-05-05 (×4): qty 1

## 2023-05-05 MED ORDER — POLYETHYLENE GLYCOL 3350 17 G PO PACK
17.0000 g | PACK | Freq: Two times a day (BID) | ORAL | Status: DC
  Administered 2023-05-05 – 2023-05-18 (×24): 17 g via ORAL
  Filled 2023-05-05 (×28): qty 1

## 2023-05-05 MED ORDER — OXYCODONE HCL 5 MG PO TABS
7.5000 mg | ORAL_TABLET | ORAL | Status: DC | PRN
  Administered 2023-05-05 – 2023-05-18 (×27): 7.5 mg via ORAL
  Filled 2023-05-05 (×27): qty 2

## 2023-05-05 MED ORDER — ALUM & MAG HYDROXIDE-SIMETH 200-200-20 MG/5ML PO SUSP
15.0000 mL | ORAL | Status: DC | PRN
  Administered 2023-05-05 – 2023-05-19 (×2): 15 mL via ORAL
  Filled 2023-05-05 (×2): qty 30

## 2023-05-05 MED ORDER — SENNOSIDES-DOCUSATE SODIUM 8.6-50 MG PO TABS
1.0000 | ORAL_TABLET | Freq: Every evening | ORAL | Status: DC | PRN
  Administered 2023-05-05: 1 via ORAL

## 2023-05-05 NOTE — H&P (Addendum)
 Physical Medicine and Rehabilitation Admission H&P        Chief Complaint  Patient presents with   Shortness of Breath   : HPI: Philip [name] is a 68 year old right-handed male with history of stage III adenocarcinoma of the lung followed by Dr. Si Gaul and previously received chemotherapy and immunotherapy back in 2020 and 2022, radiation-induced IPF, COPD/chronic hypoxia on 4-6 L nasal cannula, quit smoking 17 months ago, pulmonary MAI, pulmonary emboli maintained on Xarelto, GERD, chronic migraines, BPH with chronic right ureteral UPJ stenosis status post right-sided ureteral stent followed by Dr. Jettie Pagan.  Per chart review patient lives with spouse.  1 level home 2 steps to entry.  Ambulatory in the home and short community distances.  Patient is O2 tank limited by cardiorespiratory endurance.  Patient reports up until December 2023 he was able to work and was independent.  Presented to [facility] 04/18/2023 with increasing shortness of breath and right-sided chest pain x 2 weeks with sputum production.  Patient also endorsing dysuria and fatigue.  Patient had been scheduled for cystoscopy with right sided ureteral stent replacement 04/18/2023 but in the setting of acute hypoxic respiratory failure was admitted to the hospital.  Chest x-ray stable cardiomegaly with emphysematous lung disease and mild to moderate severity areas of bilateral linear scarring and/or atelectasis.  CT angiogram of the chest showed no pulmonary embolism.  Admission chemistries unremarkable except chloride 97 CO2 33 glucose 106, urinalysis negative nitrite, strep pneumo urinary antigen negative.  Respiratory panel negative for COVID, RSV and flu.  Echocardiogram ejection fraction of 60 to 65% no wall motion abnormalities grade 1 diastolic dysfunction.  Initial treatment was initiated for COPD exacerbation along with CAP on steroids, bronchodilators as well as placed on Rocephin/azithromycin.   Eventually cultures on 3/20 resulted in Pseudomonas therefore pulmonary was consulted and antibiotics were changed to cefepime.  Patient was transition off IV steroids to p.o. prednisone.  Patient currently remains on chronic Xarelto.  Palliative care following to establish goals of care.  Tolerating a regular consistency diet.  Patient reports he initially had constipation, now improved after several bowel movements.  On chronic hydrocodone for back pain.  Therapy evaluations completed due to patient decreased functional mobility was admitted for a comprehensive rehab program.  Review of Systems  Constitutional:  Positive for malaise/fatigue.  HENT:  Positive for hearing loss (chronic).   Eyes:  Negative for blurred vision and double vision.  Respiratory:  Positive for cough, sputum production and shortness of breath. Negative for wheezing.   Cardiovascular:  Positive for chest pain. Negative for leg swelling.  Gastrointestinal:  Positive for constipation. Negative for heartburn, nausea and vomiting.       GERD  Genitourinary:  Positive for urgency. Negative for dysuria, flank pain and hematuria.  Musculoskeletal:  Positive for joint pain and myalgias.  Skin:  Negative for rash.  Neurological:  Positive for headaches. Negative for weakness.  Psychiatric/Behavioral:  Negative for depression.   All other systems reviewed and are negative.  Past Medical History:  Diagnosis Date   Chronic kidney disease    COPD (chronic obstructive pulmonary disease) (HCC)    Dyspnea    GERD (gastroesophageal reflux disease)    Headache    migraines   Hyperlipidemia    lung ca dx'd 12/2018   On home oxygen therapy    3 to 10 L   Pneumonia    Pulmonary embolus (HCC)    Tobacco abuse  Past Surgical History:  Procedure Laterality Date   COLONOSCOPY     CYSTOSCOPY W/ URETERAL STENT PLACEMENT Right 10/24/2021   Procedure: CYSTOSCOPY WITH RETROGRADE PYELOGRAM/URETERAL STENT PLACEMENT;  Surgeon: Jannifer Hick, MD;  Location: [facility];  Service: Urology;  Laterality: Right;   CYSTOSCOPY WITH RETROGRADE PYELOGRAM, URETEROSCOPY AND STENT PLACEMENT Right 10/12/2022   Procedure: CYSTOSCOPY WITH RIGHT  RETROGRADE PYELOGRAM, RIGHT STENT EXCHANGE;  Surgeon: Jannifer Hick, MD;  Location: [facility];  Service: Urology;  Laterality: Right;   HAND SURGERY     VASECTOMY     VIDEO BRONCHOSCOPY WITH ENDOBRONCHIAL ULTRASOUND N/A 12/22/2018   Procedure: VIDEO BRONCHOSCOPY WITH ENDOBRONCHIAL ULTRASOUND;  Surgeon: Josephine Igo, DO;  Location: [facility];  Service: Thoracic;  Laterality: N/A;   VIDEO BRONCHOSCOPY WITH ENDOBRONCHIAL ULTRASOUND N/A 01/09/2019   Procedure: VIDEO BRONCHOSCOPY WITH ENDOBRONCHIAL ULTRASOUND WITH FLUORO;  Surgeon: Josephine Igo, DO;  Location: [facility];  Service: Thoracic;  Laterality: N/A;   VIDEO BRONCHOSCOPY WITH RADIAL ENDOBRONCHIAL ULTRASOUND N/A 12/22/2018   Procedure: RADIAL ENDOBRONCHIAL ULTRASOUND;  Surgeon: Josephine Igo, DO;  Location: [facility] OR;  Service: Thoracic;  Laterality: N/A;   Family History  Problem Relation Age of Onset   Cancer Father    Stomach cancer Father    Social History:  reports that he quit smoking about 17 months ago. His smoking use included cigarettes. He started smoking about 52 years ago. He has a 102.8 pack-year smoking history. He has quit using smokeless tobacco.  His smokeless tobacco use included chew. He reports current alcohol use. He reports that he does not currently use drugs. Allergies:  Allergies  Allergen Reactions   Carboplatin Anaphylaxis, Shortness Of Breath, Cough and Hypertension    On dose 8. Became short of breath. Symptom management was called. Received benadryl, solumedrol, Pepcid and fluids. Medication discontinued.    Klonopin [Clonazepam] Other (See Comments)    nervous   Norco [Hydrocodone-Acetaminophen] Other (See Comments)    Made pt feel jittery    Medications Prior to Admission  Medication Sig Dispense Refill    albuterol (VENTOLIN HFA) 108 (90 Base) MCG/ACT inhaler Inhale 2 puffs into the lungs every 4 (four) hours as needed for wheezing or shortness of breath. 18 g 11   bisacodyl (DULCOLAX) 10 MG suppository Place 1 suppository (10 mg total) rectally daily as needed for moderate constipation.     BREZTRI AEROSPHERE 160-9-4.8 MCG/ACT AERO INHALE 2 PUFFS INTO THE LUNGS IN THE MORNING AND AT BEDTIME. 10.7 each 3   dextromethorphan-guaiFENesin (MUCINEX DM) 30-600 MG 12hr tablet Take 1 tablet by mouth 2 (two) times daily as needed for cough. 40 tablet 0   furosemide (LASIX) 20 MG tablet Take 1 tablet (20 mg total) by mouth daily as needed. (Patient taking differently: Take 20 mg by mouth in the morning.) 90 tablet 0   HYDROcodone-acetaminophen (NORCO/VICODIN) 5-325 MG tablet Take 1 tablet by mouth every 12 (twelve) hours as needed for moderate pain (pain score 4-6). 60 tablet 0   morphine 20 MG/5ML solution Take 0.6 mLs (2.4 mg total) by mouth every 6 (six) hours as needed (for dyspnea and cough). **Do not take with Hydrocodone/Acetaminophen** 80 mL 0   Multiple Vitamins-Minerals (MULTI ADULT GUMMIES) CHEW Chew 1 tablet by mouth daily.     naphazoline-pheniramine (VISINE) 0.025-0.3 % ophthalmic solution Place 1 drop into both eyes daily as needed for eye irritation.     predniSONE (DELTASONE) 10 MG tablet Take 3 tablets (30 mg total) by mouth  daily with breakfast for 4 days, THEN 2 tablets (20 mg total) daily with breakfast for 4 days, THEN 1 tablet (10 mg total) daily with breakfast for 4 days.     senna-docusate (SENOKOT-S) 8.6-50 MG tablet Take 1 tablet by mouth at bedtime as needed for moderate constipation.     tamsulosin (FLOMAX) 0.4 MG CAPS capsule Take 0.4 mg by mouth at bedtime.     XARELTO 20 MG TABS tablet TAKE 1 TABLET BY MOUTH DAILY WITH SUPPER. 30 tablet 11     Home: Home Living Family/patient expects to be discharged to:: Private residence Living Arrangements: Spouse/significant  other Available Help at Discharge: Family, Available 24 hours/day Type of Home: House Home Access: Stairs to enter [employer] of Steps: 2 Entrance Stairs-Rails: None Home Layout: One level Bathroom Shower/Tub: Engineer, manufacturing systems: Standard Bathroom Accessibility: Yes Home Equipment: Shower seat, Grab bars - tub/shower Additional Comments: On 6 L O2 at home   Functional History: Prior Function Prior Level of Function : Needs assist Mobility Comments: Pt ambulatory in home and short community distances - pushes his O2 tank; limited by cardioresp endurance.  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, teach classes, crawl up on scaffolding but then was hospitalized and never recovered his endurance and on 6 L O2 since ADLs Comments: independent wtih adls; assist with iadls   Functional Status:  Mobility: Bed Mobility Overal bed mobility: Needs Assistance Bed Mobility: Supine to Sit, Sit to Supine Supine to sit: Supervision Sit to supine: Supervision Transfers Overall transfer level: Needs assistance Equipment used: None Transfers: Sit to/from Stand Sit to Stand: Contact guard assist General transfer comment: Performed STS x 8 during session Ambulation/Gait Ambulation/Gait assistance: Contact guard assist Gait Distance (Feet): 30 Feet (30x6) Assistive device:  (O2 tank) Gait Pattern/deviations: Step-through pattern General Gait Details: Pt ambulating 30' x 6 with chair follow and pushing O2 tank.  He required 3 min rest breaks between each bout.  See below for O2 sats. Gait velocity: decreased   ADL: ADL Overall ADL's : Needs assistance/impaired Eating/Feeding: Modified independent, Sitting Grooming: Sitting, Set up Upper Body Bathing: Sitting, Contact guard assist Lower Body Bathing: Minimal assistance, Sitting/lateral leans Lower Body Bathing Details (indicate cue type and reason): able to complete figure four but educated on not holding  breath to get onto lap. Upper Body Dressing : Sitting, Set up Lower Body Dressing: Sitting/lateral leans, Minimal assistance Lower Body Dressing Details (indicate cue type and reason): educated on LB Dressing AE to reduce SOB. patient was able to demonstrate understanding with reacher and sock aid for simulated pants, and don/doff socks. Toilet Transfer: Minimal Dentist Details (indicate cue type and reason): to recliner in room with patient dropping ot 84% on 8L/min with education on deep breathing and positioning to return stats to 90s. Toileting- Clothing Manipulation and Hygiene: Sitting/lateral lean, Minimal assistance Toileting - Clothing Manipulation Details (indicate cue type and reason): education on toileting buddy and bidets. General ADL Comments: patient was noted to drop to 84% on 8L/min with activity with 2 mins of breathign to bring back up to 90s during session. patient showing carryover from last session with not holding breath with challanging tasks majoirty of session.   Cognition: Cognition Orientation Level: Oriented X4 Cognition Arousal: Alert Behavior During Therapy: WFL for tasks assessed/performed      Physical Exam: Blood pressure 132/89, pulse (!) 110, temperature 97.9 F (36.6 C), temperature source Oral, resp. rate 20, height 5'  7" (1.702 m), SpO2 92%.   General: No apparent distress HEENT: Head is normocephalic, atraumatic, sclera anicteric, oral mucosa pink and moist, dentition intact Neck: Supple without JVD or lymphadenopathy Heart: Mild Tachycardia.  No murmurs rubs or gallops Chest: CTA bilaterally without wheezes, rales, or rhonchi; no distress, on 6L O2 Abdomen: Soft, non-tender, protuberant, bowel sounds positive. Extremities: No clubbing, cyanosis, or edema. Pulses are 2+ Psych: Pt's affect is appropriate. Pt is cooperative Skin: Bruising noted on bilateral upper extremities, right upper extremity IV looks okay Neuro: Alert  and oriented x 4, no apparent cognitive deficits, cranial nerves II through XII grossly intact other than hard of hearing-chronic  MOTOR: RUE: 5/5 Deltoid, 5/5 Biceps, 5/5 Triceps,5/5 Grip LUE: 5/5 Deltoid, 5/5 Biceps, 5/5 Triceps, 5/5 Grip RLE: HF 5/5, KE 5/5, ADF 5/5, APF 5/5 LLE: HF 5/5, KE 5/5, ADF 5/5, APF 5/5   REFLEXES: No ankle clonus  SENSORY: Normal to touch all 4 extremities although altered in distal lower extremities and leg dependent distribution  Coordination: Intermittent mild tremor  No results found for this or any previous visit (from the past 48 hours). No results found.    Blood pressure 132/89, pulse (!) 110, temperature 97.9 F (36.6 C), temperature source Oral, resp. rate 20, height 5\' 7"  (1.702 m), SpO2 92%.  Medical Problem List and Plan: 1. Functional deficits secondary to acute respiratory distress/acute on chronic hypoxia/Pseudomonas pneumonia.  Prednisone taper.  Patient with chronic O2.  -Patient may shower  -ELOS/Goals: 10 to 12 days, PT OT mod I  -Admit to CIR 2.  Antithrombotics: -DVT/anticoagulation:  Pharmaceutical: Xarelto for history of pulmonary emboli  -antiplatelet therapy: N/A 3. Pain Management: Oxycodone as needed  -Hx of chronic lower back pain, PDMP indicates norco 5 BID PRN 4. Mood/Behavior/Sleep: Provide emotional support  -antipsychotic agents: N/A 5. Neuropsych/cognition: This patient is capable of making decisions on his own behalf. 6. Skin/Wound Care: Routine skin checks 7. Fluids/Electrolytes/Nutrition: Routine in and outs with follow-up chemistries 8.  History of recurrent non-small cell lung cancer.  Previously received chemotherapy and immunotherapy in 2020 and 2022.  Followed outpatient by pulmonary team as well as oncology services Dr. Si Gaul 9.  History of radiation-induced IPF.  Continue bronchodilators and steroid as directed 10.  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  Followed outpatient  by urology services Dr. Cardell Peach.  Continue Flomax  -Reports dark urine at baseline 11.  Tingling bilateral feet/neuropathy-chronic  -Potentially paraneoplastic, continue to monitor     I have personally performed a face to face diagnostic evaluation of this patient and formulated the key components of the plan.  Additionally, I have personally reviewed laboratory data, imaging studies, as well as relevant notes and concur with the physician assistant's documentation above.  The patient's status has not changed from the original H&P.  Any changes in documentation from the acute care chart have been noted above.  Mcarthur Rossetti Angiulli, [name] 05/05/2023  Fanny Dance, MD

## 2023-05-05 NOTE — Discharge Instructions (Signed)
 Inpatient Rehab Discharge Instructions  Philip [name] Discharge date and time: No discharge date for patient encounter.   Activities/Precautions/ Functional Status: Activity: activity as tolerated Diet: regular diet Wound Care: Routine skin checks Functional status:  ___ No restrictions     ___ Walk up steps independently ___ 24/7 supervision/assistance   ___ Walk up steps with assistance ___ Intermittent supervision/assistance  ___ Bathe/dress independently ___ Walk with walker     _x__ Bathe/dress with assistance ___ Walk Independently    ___ Shower independently ___ Walk with assistance    ___ Shower with assistance ___ No alcohol     ___ Return to work/school ________  Special Instructions: No driving smoking or alcohol  Continue chronic oxygen therapy as prior to admission   My questions have been answered and I understand these instructions. I will adhere to these goals and the provided educational materials after my discharge from the hospital.  Patient/Caregiver Signature _______________________________ Date __________  Clinician Signature _______________________________________ Date __________  Please bring this form and your medication list with you to all your follow-up doctor's appointments.

## 2023-05-05 NOTE — Discharge Summary (Signed)
 Physician Discharge Summary  Philip [name] ZOX:[id number] DOB: July 21, 1955 DOA: 04/18/2023  PCP: Farris Has, MD  Admit date: 04/18/2023 Discharge date: 05/05/2023  Admitted From: Home Disposition:  CIR  Recommendations for Outpatient Follow-up:  Follow up with PCP in 1-2 weeks Please obtain BMP/CBC in one week your next doctors visit.  2 week prednisone taper.  Pain meds with bowel regimen  Discharge Condition: Stable CODE STATUS: Full Diet recommendation: Regular  Brief/Interim Summary: Brief Narrative:   68 year old with history of stage III adenocarcinoma of lung, PE on Xarelto, radiation-induced IPF, COPD, chronic hypoxia on 4-6 L nasal cannula, pulmonary MAI, bronchiectasis initially presented to the hospital on 3/16 for dyspnea, cough and tachycardia.  CTA of the chest was negative for PE but showed bilateral groundglass opacities and admitted to [facility] service.  Initial treatment was initiated for COPD exacerbation along with CAP on steroids, bronchodilators and Rocephin/azithromycin.  COVID, flu and RSV were negative.  Eventually cultures on 3/20 resulted as Pseudomonas therefore pulmonary was consulted and antibiotics were changed to cefepime.  Patient was also transitioned off IV steroids to p.o. prednisone.  Echocardiogram showed preserved EF.   Overall patient is doing well, transition to p.o. 2-week prednisone taper.  From respiratory standpoint patient is slowly improving but due to acute comorbidities and physical deconditioning, PT recommended CIR.  Inpatient rehab team consulted.  Assessment & Plan:  Principal Problem:   Acute on chronic hypoxic respiratory failure (HCC) Active Problems:   COPD (chronic obstructive pulmonary disease) (HCC)   Adenocarcinoma of right lung, stage 3 (HCC)   Chronic pulmonary embolism   BPH (benign prostatic hyperplasia)   History of lung cancer   Hyperlipidemia   Class I obesity   Acute respiratory distress, improved Acute on  chronic hypoxia, improved -This is multifactorial in nature in the setting of Pseudomonas pneumonia and COPD exacerbation with possibly underlying worsening of radiation-induced fibrosis.  Pseudomonas pneumonia, stable - Initially treated with Rocephin and azithromycin and eventually transition to 7 days of cefepime which patient has completed.  Acute COPD exacerbation, stable - Acute on chronic hypoxia - At baseline uses 4 to 6 L of nasal cannula.  Initially started on IV Solu-Medrol now has been transitioned to prednisone.  Echocardiogram shows preserved EF and has responded well to as needed diuretics.  Prednisone 40 mg, 2-week taper ordered  History of recurrent non-small cell lung cancer - Previously received chemotherapy and immunotherapy back in 2020 and 2022.  Followed by outpatient pulmonary team.  History of radiation induced IPF - Already on bronchodilators and steroids  History of pulmonary MAI Chronic pulmonary embolism - Patient currently is on Xarelto.  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  For now continue Flomax  Hold further lab draws unless needed.   DVT prophylaxis: Xarelto    Code Status: Full Code Family Communication: Sessile updated spouse Status is: Inpatient Remains inpatient appropriate because: Pending CIR placement   Subjective: Seen at bedside no complaints. Ready for CIR  Examination:  General exam: Appears calm and comfortable; 7L Drew  Respiratory system: Clear to auscultation. Respiratory effort normal. Cardiovascular system: S1 & S2 heard, RRR. No JVD, murmurs, rubs, gallops or clicks. No pedal edema. Gastrointestinal system: Abdomen is nondistended, soft and nontender. No organomegaly or masses felt. Normal bowel sounds heard. Central nervous system: Alert and oriented. No focal neurological deficits. Extremities: Symmetric 5 x 5 power. Skin: No rashes, lesions or ulcers Psychiatry:  Judgement and insight appear normal.  Mood & affect appropriate.       Discharge Diagnoses:  Principal Problem:   Acute on chronic hypoxic respiratory failure (HCC) Active Problems:   COPD (chronic obstructive pulmonary disease) (HCC)   Adenocarcinoma of right lung, stage 3 (HCC)   Chronic pulmonary embolism   BPH (benign prostatic hyperplasia)   History of lung cancer   Hyperlipidemia   Class I obesity      Discharge Exam: Vitals:   05/05/23 0237 05/05/23 0840  BP: 112/81   Pulse: (!) 107   Resp: 18   Temp: 98.4 F (36.9 C)   SpO2: 98% 92%   Vitals:   05/04/23 2042 05/04/23 2106 05/05/23 0237 05/05/23 0840  BP:  (!) 145/90 112/81   Pulse:  94 (!) 107   Resp:  18 18   Temp:  98.3 F (36.8 C) 98.4 F (36.9 C)   TempSrc:  Oral    SpO2: 90% 94% 98% 92%  Weight:      Height:          Discharge Instructions  Discharge Instructions     Discharge instructions   Complete by: As directed    **IMPORTANT DISCHARGE INSTRUCTIONS**   From Dr. Maryfrances Bunnell: You were admitted for trouble breathing and cough  Here, you were treated with high dose steroids, diuretics and antibiotics.  Your sputum culture (mucus from your chest) eventually grew a bacteria called Pseudomonas which is resistant to some common antibiotics, so you were changed to a different antibiotic  Complete the course with 7 more days of ciprofloxacin 500 mg twice daily  Taper the prednisone back to 10 mg  For the next three days (Mon, Tues, Weds) take prednisone 30 mg (3 tabs) once daily Then reduce to prednisone 20 mg (2 tabs) once daily on Thu, Fri Sat After that reduce to 10 mg until you see Dr. Judeth Horn  For cough and feeling short of breath, take morphine 2.5 mg (1.3 mL) up to 4 times per day  This is a narcotic cough medicine, so be cautious taking this at the same time of hydrocodone or other sedating medicines (or alcohol)   Increase activity slowly   Complete by: As directed        Allergies as of 05/05/2023       Reactions   Carboplatin Anaphylaxis, Shortness Of Breath, Cough, Hypertension   On dose 8. Became short of breath. Symptom management was called. Received benadryl, solumedrol, Pepcid and fluids. Medication discontinued.    Klonopin [clonazepam] Other (See Comments)   nervous   Norco [hydrocodone-acetaminophen] Other (See Comments)   Made pt feel jittery         Medication List     TAKE these medications    albuterol 108 (90 Base) MCG/ACT inhaler Commonly known as: VENTOLIN HFA Inhale 2 puffs into the lungs every 4 (four) hours as needed for wheezing or shortness of breath.   bisacodyl 10 MG suppository Commonly known as: DULCOLAX Place 1 suppository (10 mg total) rectally daily as needed for moderate constipation.   Breztri Aerosphere 160-9-4.8 MCG/ACT Aero Generic drug: budeson-glycopyrrolate-formoterol INHALE 2 PUFFS INTO THE LUNGS IN THE MORNING AND AT BEDTIME.   dextromethorphan-guaiFENesin 30-600 MG 12hr tablet Commonly known as: MUCINEX DM Take 1 tablet by mouth 2 (two) times daily as needed for cough.   furosemide 20 MG tablet Commonly known as: LASIX Take 1 tablet (20 mg total) by mouth daily as needed. What changed: when to take this   HYDROcodone-acetaminophen 5-325 MG tablet  Commonly known as: NORCO/VICODIN Take 1 tablet by mouth every 12 (twelve) hours as needed for moderate pain (pain score 4-6).   morphine 20 MG/5ML solution Take 0.6 mLs (2.4 mg total) by mouth every 6 (six) hours as needed (for dyspnea and cough). **Do not take with Hydrocodone/Acetaminophen**   Multi Adult Gummies Chew Chew 1 tablet by mouth daily.   predniSONE 10 MG tablet Commonly known as: DELTASONE Take 3 tablets (30 mg total) by mouth daily with breakfast for 4 days, THEN 2 tablets (20 mg total) daily with breakfast for 4 days, THEN 1 tablet (10 mg total) daily with breakfast for 4 days. Start taking on: May 03, 2023 What changed: See the  new instructions.   senna-docusate 8.6-50 MG tablet Commonly known as: Senokot-S Take 1 tablet by mouth at bedtime as needed for moderate constipation.   tamsulosin 0.4 MG Caps capsule Commonly known as: FLOMAX Take 0.4 mg by mouth at bedtime.   Visine 0.025-0.3 % ophthalmic solution Generic drug: naphazoline-pheniramine Place 1 drop into both eyes daily as needed for eye irritation.   Xarelto 20 MG Tabs tablet Generic drug: rivaroxaban TAKE 1 TABLET BY MOUTH DAILY WITH SUPPER.       ASK your doctor about these medications    ciprofloxacin 500 MG tablet Commonly known as: Cipro Take 1 tablet (500 mg total) by mouth 2 (two) times daily for 7 days. Ask about: Should I take this medication?        Follow-up Information     Si Gaul, MD Follow up.   Specialty: Oncology Contact information: [address] [name] [address] [phone]         Karren Burly, MD. Go on 05/05/2023.   Specialty: Pulmonary Disease Why: You have a follow up appointment scheduled with Dr. Judeth Horn on 4/2 at [facility] in [facility] information: [address] [place] [address] [phone]         Farris Has, MD Follow up.   Specialty: Family Medicine Contact information: [address] [place] [address] [phone]                Allergies  Allergen Reactions   Carboplatin Anaphylaxis, Shortness Of Breath, Cough and Hypertension    On dose 8. Became short of breath. Symptom management was called. Received benadryl, solumedrol, Pepcid and fluids. Medication discontinued.    Klonopin [Clonazepam] Other (See Comments)    nervous   Norco [Hydrocodone-Acetaminophen] Other (See Comments)    Made pt feel jittery     You were cared for by a hospitalist during your hospital stay. If you have any questions about your discharge medications or the care you received while you were in the hospital  after you are discharged, you can call the unit and asked to speak with the hospitalist on call if the hospitalist that took care of you is not available. Once you are discharged, your primary care physician will handle any further medical issues. Please note that no refills for any discharge medications will be authorized once you are discharged, as it is imperative that you return to your primary care physician (or establish a relationship with a primary care physician if you do not have one) for your aftercare needs so that they can reassess your need for medications and monitor your lab values.  You were cared for by a hospitalist during your hospital stay. If you have any questions about your discharge medications or the care  you received while you were in the hospital after you are discharged, you can call the unit and asked to speak with the hospitalist on call if the hospitalist that took care of you is not available. Once you are discharged, your primary care physician will handle any further medical issues. Please note that NO REFILLS for any discharge medications will be authorized once you are discharged, as it is imperative that you return to your primary care physician (or establish a relationship with a primary care physician if you do not have one) for your aftercare needs so that they can reassess your need for medications and monitor your lab values.  Please request your Prim.MD to go over all Hospital Tests and Procedure/Radiological results at the follow up, please get all Hospital records sent to your Prim MD by signing hospital release before you go home.  Get CBC, CMP, 2 view Chest X ray checked  by Primary MD during your next visit or SNF MD in 5-7 days ( we routinely change or add medications that can affect your baseline labs and fluid status, therefore we recommend that you get the mentioned basic workup next visit with your PCP, your PCP may decide not to get them or add new tests  based on their clinical decision)  On your next visit with your primary care physician please Get Medicines reviewed and adjusted.  If you experience worsening of your admission symptoms, develop shortness of breath, life threatening emergency, suicidal or homicidal thoughts you must seek medical attention immediately by calling 911 or calling your MD immediately  if symptoms less severe.  You Must read complete instructions/literature along with all the possible adverse reactions/side effects for all the Medicines you take and that have been prescribed to you. Take any new Medicines after you have completely understood and accpet all the possible adverse reactions/side effects.   Do not drive, operate heavy machinery, perform activities at heights, swimming or participation in water activities or provide baby sitting services if your were admitted for syncope or siezures until you have seen by Primary MD or a Neurologist and advised to do so again.  Do not drive when taking Pain medications.   Procedures/Studies: ECHOCARDIOGRAM COMPLETE Result Date: 04/28/2023    ECHOCARDIOGRAM REPORT   Patient Name:   Philip [name] Date of Exam: 04/28/2023 Medical Rec #:  [id number]        Height:       67.0 in Accession #:    [id number]       Weight:       200.0 lb Date of Birth:  01/17/1956        BSA:          2.022 m Patient Age:    67 years         BP:           106/83 mmHg Patient Gender: M                HR:           105 bpm. Exam Location:  Inpatient Procedure: 2D Echo, Cardiac Doppler and Color Doppler (Both Spectral and Color            Flow Doppler were utilized during procedure). Indications:    Pulmonary Hypertension  History:        Patient has prior history of Echocardiogram examinations, most                 recent 06/17/2019.  COPD; Risk Factors:Dyslipidemia and Former                 Smoker.  Sonographer:    Karma Ganja Referring Phys: [id number] CHRISTOPHER P DANFORD IMPRESSIONS  1. Left ventricular  ejection fraction, by estimation, is 60 to 65%. The left ventricle has normal function. The left ventricle has no regional wall motion abnormalities. Left ventricular diastolic parameters are consistent with Grade I diastolic dysfunction (impaired relaxation).  2. Right ventricular systolic function is normal. The right ventricular size is normal.  3. Left atrial size was mildly dilated.  4. The mitral valve is normal in structure. No evidence of mitral valve regurgitation. No evidence of mitral stenosis.  5. The aortic valve is normal in structure. Aortic valve regurgitation is not visualized. No aortic stenosis is present.  6. The inferior vena cava is normal in size with greater than 50% respiratory variability, suggesting right atrial pressure of 3 mmHg. Comparison(s): Prior images reviewed side by side. FINDINGS  Left Ventricle: Left ventricular ejection fraction, by estimation, is 60 to 65%. The left ventricle has normal function. The left ventricle has no regional wall motion abnormalities. The left ventricular internal cavity size was normal in size. There is  no left ventricular hypertrophy. Left ventricular diastolic parameters are consistent with Grade I diastolic dysfunction (impaired relaxation). Right Ventricle: The right ventricular size is normal. No increase in right ventricular wall thickness. Right ventricular systolic function is normal. Left Atrium: Left atrial size was mildly dilated. Right Atrium: Right atrial size was normal in size. Pericardium: There is no evidence of pericardial effusion. Mitral Valve: The mitral valve is normal in structure. No evidence of mitral valve regurgitation. No evidence of mitral valve stenosis. Tricuspid Valve: The tricuspid valve is normal in structure. Tricuspid valve regurgitation is not demonstrated. No evidence of tricuspid stenosis. Aortic Valve: The aortic valve is normal in structure. Aortic valve regurgitation is not visualized. No aortic stenosis is  present. Aortic valve mean gradient measures 3.0 mmHg. Aortic valve peak gradient measures 4.9 mmHg. Aortic valve area, by VTI measures 3.31 cm. Pulmonic Valve: The pulmonic valve was normal in structure. Pulmonic valve regurgitation is not visualized. No evidence of pulmonic stenosis. Aorta: The aortic root is normal in size and structure. Venous: The inferior vena cava is normal in size with greater than 50% respiratory variability, suggesting right atrial pressure of 3 mmHg. IAS/Shunts: No atrial level shunt detected by color flow Doppler.  LEFT VENTRICLE PLAX 2D LVIDd:         4.40 cm   Diastology LVIDs:         3.00 cm   LV e' medial:    6.85 cm/s LV PW:         1.00 cm   LV E/e' medial:  10.0 LV IVS:        0.90 cm   LV e' lateral:   7.62 cm/s LVOT diam:     2.20 cm   LV E/e' lateral: 9.0 LV SV:         51 LV SV Index:   25 LVOT Area:     3.80 cm  RIGHT VENTRICLE RV Basal diam:  3.90 cm LEFT ATRIUM           Index        RIGHT ATRIUM           Index LA diam:      4.30 cm 2.13 cm/m   RA Area:     11.80  cm LA Vol (A2C): 69.0 ml 34.12 ml/m  RA Volume:   22.20 ml  10.98 ml/m LA Vol (A4C): 51.8 ml 25.62 ml/m  AORTIC VALVE AV Area (Vmax):    3.14 cm AV Area (Vmean):   3.31 cm AV Area (VTI):     3.31 cm AV Vmax:           111.00 cm/s AV Vmean:          74.700 cm/s AV VTI:            0.155 m AV Peak Grad:      4.9 mmHg AV Mean Grad:      3.0 mmHg LVOT Vmax:         91.70 cm/s LVOT Vmean:        65.100 cm/s LVOT VTI:          0.135 m LVOT/AV VTI ratio: 0.87  AORTA Ao Root diam: 3.90 cm Ao Asc diam:  2.90 cm MITRAL VALVE MV Area (PHT): 5.02 cm    SHUNTS MV Decel Time: 151 msec    Systemic VTI:  0.14 m MV E velocity: 68.60 cm/s  Systemic Diam: 2.20 cm MV A velocity: 92.50 cm/s MV E/A ratio:  0.74 Donato Schultz MD Electronically signed by Donato Schultz MD Signature Date/Time: 04/28/2023/3:38:51 PM    Final    CT Angio Chest PE W and/or Wo Contrast Result Date: 04/18/2023 CLINICAL DATA:  Positive D-dimer. PE  suspected. Shortness of breath and right-sided chest pain. EXAM: CT ANGIOGRAPHY CHEST WITH CONTRAST TECHNIQUE: Multidetector CT imaging of the chest was performed using the standard protocol during bolus administration of intravenous contrast. Multiplanar CT image reconstructions and MIPs were obtained to evaluate the vascular anatomy. RADIATION DOSE REDUCTION: This exam was performed according to the departmental dose-optimization program which includes automated exposure control, adjustment of the mA and/or kV according to patient size and/or use of iterative reconstruction technique. CONTRAST:  OMNIPAQUE IOHEXOL 350 MG/ML SOLN COMPARISON:  Radiograph earlier today and CT 03/26/2023 FINDINGS: Cardiovascular: No pericardial effusion. No pulmonary embolism. Mild aortic and coronary artery atherosclerotic calcification. Mediastinum/Nodes: Saber sheath trachea. Esophagus is unremarkable. No thoracic adenopathy. Lungs/Pleura: Similar post treatment/post radiation change with dense right-greater-than-left perihilar fibrosis and volume loss. Ground-glass opacification of both lungs appears increased compared to 03/26/2023. Superimposed infection or edema is difficult to exclude. Similar small right pleural effusion and trace left pleural effusion. No pneumothorax. Upper Abdomen: No acute abnormality. Musculoskeletal: No acute fracture. Review of the MIP images confirms the above findings. IMPRESSION: 1. No pulmonary embolism. 2. Similar post treatment/post radiation change with dense right-greater-than-left perihilar fibrosis and volume loss. 3. Ground-glass opacification of both lungs appears increased compared to 03/26/2023. This may be related to differences in inspiratory effort however infection or edema are difficult to exclude. 4. Similar small right pleural effusion and trace left pleural effusion, similar to prior. 5. Aortic Atherosclerosis (ICD10-I70.0). Electronically Signed   By: Minerva Fester M.D.    On: 04/18/2023 21:01   DG Chest 2 View Result Date: 04/18/2023 CLINICAL DATA:  Shortness of breath and right-sided chest pain. EXAM: CHEST - 2 VIEW COMPARISON:  March 25, 2022 FINDINGS: The cardiac silhouette is enlarged and unchanged in size. 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 Stable mild to moderate severity areas of linear scarring and/or atelectasis are seen within the mid to upper lung fields, bilaterally. A stable small to moderate sized right-sided pleural effusion is noted. No pneumothorax is identified. Multilevel degenerative changes seen throughout the  thoracic spine. IMPRESSION: 1. Stable cardiomegaly with emphysematous lung disease and mild to moderate severity areas of bilateral linear scarring and/or atelectasis. 2. Stable small to moderate sized right-sided pleural effusion. Electronically Signed   By: Aram Candela M.D.   On: 04/18/2023 19:57     The results of significant diagnostics from this hospitalization (including imaging, microbiology, ancillary and laboratory) are listed below for reference.     Microbiology: Recent Results (from the past 240 hours)  Acid Fast Smear (AFB)     Status: None   Collection Time: 04/28/23  5:19 PM   Specimen: Sputum; Respiratory  Result Value Ref Range Status   AFB Specimen Processing Concentration  Final   Acid Fast Smear Negative  Final    Comment: (NOTE) Performed At: [facility] [address] [place], [place] [id number] Jolene Schimke MD MW:[id number]    Source (AFB) SPUTUM  Final    Comment: Performed at [facility], [address], [place], [place] [address]     Labs: BNP (last 3 results) Recent Labs    04/18/23 1905 05/01/23 0438  BNP 86.5 91.8   Basic Metabolic Panel: Recent Labs  Lab 04/29/23 0407 04/30/23 0340 05/01/23 0438 05/02/23 0350  NA 137 138 138 143  K 4.1 4.3 4.0 5.0  CL 98 98 99 98  CO2 31 29 31  36*  GLUCOSE 100* 122* 117* 99  BUN 28* 27* 27* 29*   CREATININE 0.86 0.74 0.82 1.00  CALCIUM 8.5* 8.8* 8.5* 9.1  PHOS  --   --  3.7  --    Liver Function Tests: No results for input(s): "AST", "ALT", "ALKPHOS", "BILITOT", "PROT", "ALBUMIN" in the last 168 hours. No results for input(s): "LIPASE", "AMYLASE" in the last 168 hours. No results for input(s): "AMMONIA" in the last 168 hours. CBC: Recent Labs  Lab 04/29/23 0407 04/30/23 0340 05/01/23 0438 05/02/23 0350  WBC 11.3* 13.9* 12.0* 12.3*  HGB 11.3* 12.1* 11.1* 11.8*  HCT 36.4* 40.2 37.0* 39.3  MCV 95.5 97.3 98.4 97.0  PLT 212 219 185 200   Cardiac Enzymes: No results for input(s): "CKTOTAL", "CKMB", "CKMBINDEX", "TROPONINI" in the last 168 hours. BNP: Invalid input(s): "POCBNP" CBG: No results for input(s): "GLUCAP" in the last 168 hours. D-Dimer No results for input(s): "DDIMER" in the last 72 hours. Hgb A1c No results for input(s): "HGBA1C" in the last 72 hours. Lipid Profile No results for input(s): "CHOL", "HDL", "LDLCALC", "TRIG", "CHOLHDL", "LDLDIRECT" in the last 72 hours. Thyroid function studies No results for input(s): "TSH", "T4TOTAL", "T3FREE", "THYROIDAB" in the last 72 hours.  Invalid input(s): "FREET3" Anemia work up No results for input(s): "VITAMINB12", "FOLATE", "FERRITIN", "TIBC", "IRON", "RETICCTPCT" in the last 72 hours. Urinalysis    Component Value Date/Time   COLORURINE YELLOW 04/19/2023 0404   APPEARANCEUR CLOUDY (A) 04/19/2023 0404   LABSPEC 1.028 04/19/2023 0404   PHURINE 6.0 04/19/2023 0404   GLUCOSEU NEGATIVE 04/19/2023 0404   HGBUR LARGE (A) 04/19/2023 0404   BILIRUBINUR NEGATIVE 04/19/2023 0404   KETONESUR NEGATIVE 04/19/2023 0404   PROTEINUR 30 (A) 04/19/2023 0404   NITRITE NEGATIVE 04/19/2023 0404   LEUKOCYTESUR NEGATIVE 04/19/2023 0404   Sepsis Labs Recent Labs  Lab 04/29/23 0407 04/30/23 0340 05/01/23 0438 05/02/23 0350  WBC 11.3* 13.9* 12.0* 12.3*   Microbiology Recent Results (from the past 240 hours)  Acid Fast  Smear (AFB)     Status: None   Collection Time: 04/28/23  5:19 PM   Specimen: Sputum; Respiratory  Result  Value Ref Range Status   AFB Specimen Processing Concentration  Final   Acid Fast Smear Negative  Final    Comment: (NOTE) Performed At: [facility] [address] [place], [place] [id number] Jolene Schimke MD GE:[id number]    Source (AFB) SPUTUM  Final    Comment: Performed at [facility], [address], [place], [place] [address]     Time coordinating discharge:  I have spent 35 minutes face to face with the patient and on the ward discussing the patients care, assessment, plan and disposition with other care givers. >50% of the time was devoted counseling the patient about the risks and benefits of treatment/Discharge disposition and coordinating care.   SIGNED:   Miguel Rota, MD  [facility] 05/05/2023, 9:30 AM   If 7PM-7AM, please contact night-coverage

## 2023-05-05 NOTE — Progress Notes (Signed)
 PT Cancellation Note  Patient Details Name: Philip [name] MRN: [id number] DOB: March 04, 1955   Cancelled Treatment:    Reason Eval/Treat Not Completed: Fatigue/lethargy limiting ability to participate (pt reports he's fatigued from a busy morning and wants to save energy for transfer to AIR.)   Tamala Ser PT 05/05/2023  Acute Rehabilitation Services  Office [phone]

## 2023-05-05 NOTE — Progress Notes (Signed)
 Inpatient Rehab Admissions Coordinator:   I have a CIR admit for this Pt. Today. RN may call report to [phone].  Pt. In agreement to d/c CIR for an estimated 7-10 days with the goal of dc home with his wife.   Megan Salon, MS, CCC-SLP Rehab Admissions Coordinator  [phone] (celll) [phone] (office)

## 2023-05-05 NOTE — Plan of Care (Signed)

## 2023-05-05 NOTE — TOC Transition Note (Signed)
 Transition of Care [facility]) - Discharge Note   Patient Details  Name: Philip [name] MRN: [id number] Date of Birth: Dec 08, 1955  Transition of [facility]) CM/SW Contact:  Larrie Kass, LCSW Phone Number: 05/05/2023, 9:51 AM   Clinical Narrative:     Pt was approved for CIR and will transfer today. TOC will sign off.      Patient Goals and CMS Choice            Discharge Placement                       Discharge Plan and Services Additional resources added to the After Visit Summary for   In-house Referral: Clinical Social Work              DME Arranged: N/A DME Agency: NA                  Social Drivers of Health (SDOH) Interventions SDOH Screenings   Food Insecurity: No Food Insecurity (04/19/2023)  Housing: Low Risk  (04/19/2023)  Transportation Needs: No Transportation Needs (04/19/2023)  Utilities: Not At Risk (04/19/2023)  Social Connections: Moderately Integrated (04/19/2023)  Tobacco Use: Medium Risk (04/18/2023)     Readmission Risk Interventions    01/22/2022   11:05 AM 01/14/2022   12:03 PM  Readmission Risk Prevention Plan  Transportation Screening Complete Complete  PCP or Specialist Appt within 5-7 Days Complete Complete  Home Care Screening Complete Complete  Medication Review (RN CM) Complete Complete

## 2023-05-05 NOTE — Discharge Summary (Signed)
 Physician Discharge Summary  Patient ID: Philip [name] MRN: [id number] DOB/AGE: [birth year] 68 y.o.  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 Exam Location:  Inpatient Procedure: 2D Echo, Cardiac Doppler and Color Doppler (Both Spectral and Color            Flow Doppler were utilized during procedure). Indications:    Pulmonary Hypertension  History:        Patient has prior history of Echocardiogram examinations, most                 recent 06/17/2019. COPD; Risk Factors:Dyslipidemia and Former                 Smoker.  Sonographer:    Reta Cassis Referring Phys: [id number] CHRISTOPHER P DANFORD IMPRESSIONS  1. Left ventricular ejection fraction, by estimation, is 60 to 65%. The left ventricle has normal function. The left ventricle has no regional wall motion abnormalities. Left ventricular diastolic parameters are consistent with Grade I diastolic dysfunction (impaired relaxation).  2. Right ventricular systolic function is normal. The right ventricular size is normal.  3. Left atrial size was mildly dilated.  4. The mitral valve is normal in structure. No evidence of mitral valve regurgitation. No evidence of mitral stenosis.  5. The aortic  valve is normal in structure. Aortic valve regurgitation is not visualized. No aortic stenosis is present.  6. The inferior vena cava is normal in size with greater than 50% respiratory variability, suggesting right atrial pressure of 3 mmHg. Comparison(s): Prior images reviewed side by side. FINDINGS  Left Ventricle: Left ventricular ejection fraction, by estimation, is 60 to 65%. The left ventricle has normal function. The left ventricle has no regional wall motion abnormalities. The left ventricular internal cavity size was normal in size. There is  no left ventricular hypertrophy. Left ventricular diastolic parameters are consistent with Grade I diastolic dysfunction (impaired relaxation). Right Ventricle: The right ventricular size is normal. No increase in right ventricular wall thickness. Right ventricular systolic function is normal. Left Atrium: Left atrial size was mildly dilated. Right Atrium: Right atrial size was normal in size. Pericardium: There is no evidence of pericardial effusion. Mitral Valve: The mitral valve is normal in structure. No evidence of mitral valve regurgitation. No evidence of mitral valve stenosis. Tricuspid Valve: The tricuspid valve is normal in structure. Tricuspid valve regurgitation  is not demonstrated. No evidence of tricuspid stenosis. Aortic Valve: The aortic valve is normal in structure. Aortic valve regurgitation is not visualized. No aortic stenosis is present. Aortic valve mean gradient measures 3.0 mmHg. Aortic valve peak gradient measures 4.9 mmHg. Aortic valve area, by VTI measures 3.31 cm. Pulmonic Valve: The pulmonic valve was normal in structure. Pulmonic valve regurgitation is not visualized. No evidence of pulmonic stenosis. Aorta: The aortic root is normal in size and structure. Venous: The inferior vena cava is normal in size with greater than 50% respiratory variability, suggesting right atrial pressure of 3 mmHg. IAS/Shunts: No atrial level shunt detected  by color flow Doppler.  LEFT VENTRICLE PLAX 2D LVIDd:         4.40 cm   Diastology LVIDs:         3.00 cm   LV e' medial:    6.85 cm/s LV PW:         1.00 cm   LV E/e' medial:  10.0 LV IVS:        0.90 cm   LV e' lateral:   7.62 cm/s LVOT diam:     2.20 cm   LV E/e' lateral: 9.0 LV SV:         51 LV SV Index:   25 LVOT Area:     3.80 cm  RIGHT VENTRICLE RV Basal diam:  3.90 cm LEFT ATRIUM           Index        RIGHT ATRIUM           Index LA diam:      4.30 cm 2.13 cm/m   RA Area:     11.80 cm LA Vol (A2C): 69.0 ml 34.12 ml/m  RA Volume:   22.20 ml  10.98 ml/m LA Vol (A4C): 51.8 ml 25.62 ml/m  AORTIC VALVE AV Area (Vmax):    3.14 cm AV Area (Vmean):   3.31 cm AV Area (VTI):     3.31 cm AV Vmax:           111.00 cm/s AV Vmean:          74.700 cm/s AV VTI:            0.155 m AV Peak Grad:      4.9 mmHg AV Mean Grad:      3.0 mmHg LVOT Vmax:         91.70 cm/s LVOT Vmean:        65.100 cm/s LVOT VTI:          0.135 m LVOT/AV VTI ratio: 0.87  AORTA Ao Root diam: 3.90 cm Ao Asc diam:  2.90 cm MITRAL VALVE MV Area (PHT): 5.02 cm    SHUNTS MV Decel Time: 151 msec    Systemic VTI:  0.14 m MV E velocity: 68.60 cm/s  Systemic Diam: 2.20 cm MV A velocity: 92.50 cm/s MV E/A ratio:  0.74 Dorothye Gathers MD Electronically signed by Dorothye Gathers MD Signature Date/Time: 04/28/2023/3:38:51 PM    Final    CT Angio Chest PE W and/or Wo Contrast Result Date: 04/18/2023 CLINICAL DATA:  Positive D-dimer. PE suspected. Shortness of breath and right-sided chest pain. EXAM: CT ANGIOGRAPHY CHEST WITH CONTRAST TECHNIQUE: Multidetector CT imaging of the chest was performed using the standard protocol during bolus administration of intravenous contrast. Multiplanar CT image reconstructions and MIPs were obtained to evaluate the vascular anatomy. RADIATION DOSE REDUCTION: This exam was performed according to the departmental dose-optimization program  which includes automated exposure control, adjustment of the mA and/or kV according to  patient size and/or use of iterative reconstruction technique. CONTRAST:  OMNIPAQUE IOHEXOL 350 MG/ML SOLN COMPARISON:  Radiograph earlier today and CT 03/26/2023 FINDINGS: Cardiovascular: No pericardial effusion. No pulmonary embolism. Mild aortic and coronary artery atherosclerotic calcification. Mediastinum/Nodes: Saber sheath trachea. Esophagus is unremarkable. No thoracic adenopathy. Lungs/Pleura: Similar post treatment/post radiation change with dense right-greater-than-left perihilar fibrosis and volume loss. Ground-glass opacification of both lungs appears increased compared to 03/26/2023. Superimposed infection or edema is difficult to exclude. Similar small right pleural effusion and trace left pleural effusion. No pneumothorax. Upper Abdomen: No acute abnormality. Musculoskeletal: No acute fracture. Review of the MIP images confirms the above findings. IMPRESSION: 1. No pulmonary embolism. 2. Similar post treatment/post radiation change with dense right-greater-than-left perihilar fibrosis and volume loss. 3. Ground-glass opacification of both lungs appears increased compared to 03/26/2023. This may be related to differences in inspiratory effort however infection or edema are difficult to exclude. 4. Similar small right pleural effusion and trace left pleural effusion, similar to prior. 5. Aortic Atherosclerosis (ICD10-I70.0). Electronically Signed   By: Rozell Cornet M.D.   On: 04/18/2023 21:01   DG Chest 2 View Result Date: 04/18/2023 CLINICAL DATA:  Shortness of breath and right-sided chest pain. EXAM: CHEST - 2 VIEW COMPARISON:  March 25, 2022 FINDINGS: The cardiac silhouette is enlarged and unchanged in size. 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 Stable mild to moderate severity areas of linear scarring and/or atelectasis are seen within the mid to upper lung fields, bilaterally. A stable small to moderate sized right-sided pleural effusion is noted. No pneumothorax is  identified. Multilevel degenerative changes seen throughout the thoracic spine. IMPRESSION: 1. Stable cardiomegaly with emphysematous lung disease and mild to moderate severity areas of bilateral linear scarring and/or atelectasis. 2. Stable small to moderate sized right-sided pleural effusion. Electronically Signed   By: Virgle Grime M.D.   On: 04/18/2023 19:57    Labs:  Basic Metabolic Panel: Recent Labs  Lab 04/29/23 0407 04/30/23 0340 05/01/23 0438 05/02/23 0350  NA 137 138 138 143  K 4.1 4.3 4.0 5.0  CL 98 98 99 98  CO2 31 29 31  36*  GLUCOSE 100* 122* 117* 99  BUN 28* 27* 27* 29*  CREATININE 0.86 0.74 0.82 1.00  CALCIUM 8.5* 8.8* 8.5* 9.1  PHOS  --   --  3.7  --     CBC: Recent Labs  Lab 04/30/23 0340 05/01/23 0438 05/02/23 0350  WBC 13.9* 12.0* 12.3*  HGB 12.1* 11.1* 11.8*  HCT 40.2 37.0* 39.3  MCV 97.3 98.4 97.0  PLT 219 185 200    CBG: No results for input(s): "GLUCAP" in the last 168 hours.  Family history.  Father with stomach cancer.  Denies any colon cancer esophageal cancer or rectal cancer  Brief HPI:   Philip [name] is a 68 y.o. right-handed male with history significant for stage III adenocarcinoma of the lung followed by Dr. 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 Atilano Blander.  Per chart review lives with spouse.  1 level home 2 steps to entry.  Ambulatory in the home and short community distances.  Patient reports up  until December 2023 he was able to work and was independent.  Presented to [facility] 04/18/2023 with increasing shortness of breath right side chest pain x 2 weeks with sputum production.  Patient also  endorsing dysuria and fatigue.  Patient had been scheduled for cystoscopy with right sided ureteral stent replacement 04/18/2023 but in the setting of acute hypoxic respiratory failure was admitted to the hospital.  Chest x-ray stable cardiomegaly with emphysematous lung disease and mild to moderate severity areas of bilateral linear scarring and/or atelectasis.  CT angiogram of the chest showed no pulmonary emboli.  Admission chemistries unremarkable except chloride 97 CO2 33 glucose 106 urinalysis negative nitrite, strep pneumo urinary antigen negative.  Respiratory panel negative for COVID, RSV and flu.  Echocardiogram with ejection fraction of 60 to 65% no wall motion abnormalities grade 1 diastolic dysfunction.  Initial treatment was initiated for COPD exacerbation along with CAP on steroids, bronchodilators as well as placed on Rocephin and azithromycin.  Eventually cultures on 3/20 resulted in Pseudomonas therefore pulmonary was consulted and antibiotics were changed to cefepime.  Patient was transitioned off IV steroids to p.o. prednisone and taper.  He continues on chronic Xarelto.  Palliative care consulted to establish goals of care.  Therapy evaluations completed due to patient decreased functional mobility was admitted for a comprehensive rehab program.   Hospital Course: Philip [name] was admitted to rehab 05/05/2023 for inpatient therapies to consist of PT, ST and OT at least three hours five days a week. Past admission physiatrist, therapy team and rehab RN have worked together to provide customized collaborative inpatient rehab.  Pertaining to patient's acute respiratory distress acute on chronic hypoxia Pseudomonas pneumonia remained stable antibiotic therapies completed prednisone taper as advised he continued on chronic oxygen therapy follow-up pulmonary services.  Xarelto ongoing for history of pulmonary emboli.  Latest CT angiogram negative.  Oxycodone for pain management with history of  chronic low back pain he was using Norco 5 twice daily as needed prior to admission.  History of recurrent non-small cell lung cancer.  Previously received chemotherapy and immunotherapy 2020 and 2022 followed by outpatient pulmonary services as well as oncology services Dr. Randall Bush.  History of radiation induced IPF continued on bronchodilators and steroid as directed.  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  Followed outpatient by urology services Dr. Freddi Jaeger and continued on Flomax.   Blood pressures were monitored on TID basis and remained controlled and monitored     Rehab course: During patient's stay in rehab weekly team conferences were held to monitor patient's progress, set goals and discuss barriers to discharge. At admission, patient required contact-guard assist 30 feet step through pattern contact-guard sit to stand  Physical exam.  Blood pressure 132/89 pulse 110 temperature 97.9 respirations 18 oxygen saturation is 92% on 6 L Constitutional.  No acute distress HEENT Head.  Normocephalic and atraumatic Eyes.  Pupils round and reactive to light no discharge without nystagmus Neck.  Supple nontender no JVD without thyromegaly Cardiac regular rate and rhythm without any extra sounds or murmur heard Abdomen.  Soft nontender positive bowel sounds without rebound Respiratory effort normal no respiratory distress without wheeze Neurologic.  Alert oriented x 3 following commands cranial nerves II through XII intact Motor Right upper extremity 5/5 Left upper extremity 5/5 deltoids biceps triceps and grip Right lower extremity hip flexors 5/5 KE 5-5 ADF 5/5 APF 5/5 Lower extremity hip flexors 5/5 KE 5/5 ADF 5/5 APF 5/5  He/She  has had improvement in activity tolerance, balance, postural control as well as ability to compensate for deficits. He/She has had improvement in functional use RUE/LUE  and RLE/LLE as well as improvement in awareness.  Supine to sit with  use of bed features and supervision.  Working with energy conservation techniques.  Patient perform stand pivot transfers bed to recliner with supervision and no assistive device.  Patient able to maintain goal SpO2 for majority of sessions.  Completed all functional mobility within room utilizing rolling walker and standby assist.  Completed toilet transfers toilet and bathing at standby assist.  TTB utilized in shower to complete bathing while seated.  Completed upper body dressing standby assist lower body dressing completed with minimal assist.  He ambulates short household distances with the use of oxygen tank assistive device and close supervision.  Full family teaching completed plan discharge to home       Disposition:  There are no questions and answers to display.         Diet: Regular  Special Instructions: No driving smoking or alcohol  Continue chronic oxygen therapy as prior to admission  Medications at discharge 1.  Breztri Aero sphere inhaler 2 puffs into the lungs in the morning and bedtime 2.  Flomax 0.4 mg nightly 3.  Mucinex 600 mg p.o. twice daily 4.  Hydrocodone 5-325 mg 1 tablet every 12 hours as needed moderate pain 5.  Advil 400 mg every 6 hours as needed pain 6.  DuoNeb 3 mL every every 4 hours as needed 7.  MiraLAX 17 g daily hold for loose stools 8.  Xarelto 20 mg p.o. daily 9..  Ventolin inhaler 2 puffs every 4 hours as needed shortness of breath 10.  Lasix 20 mg daily as needed edema 11.  Multivitamin daily 12.  Visine ophthalmic solution 1 drop into both eyes daily as needed for eye irritation 13.  Senokot S1 tablet nightly as needed for moderate constipation 14.  Dulcolax suppository daily as needed constipation 15.  Pulmicort nebulizer solution 0.5 mg twice daily as needed 16.Magnesium gluconate  500 mg daily   30-35 minutes were spent completing discharge summary and discharge planning    Follow-up Information     Lylia Sand, MD  Follow up.   Specialty: Physical Medicine and Rehabilitation Why: No formal follow-up needed Contact information: [address] [place] [address] [phone]         Lahoma Pigg, MD Follow up.   Specialty: Urology Why: Call for appointment Contact information: [address] [place] [address] [phone]         Marlene Simas, MD Follow up.   Specialty: Oncology Why: Call for appointment Contact information: [address] [place] [address] [phone]         Hunsucker, Archer Kobs, MD Follow up.   Specialty: Pulmonary Disease Why: Call for appointment Contact information: [address] [place] [address] [phone]                 Signed: Everlyn Hockey Masud Holub 05/05/2023, 6:18 PM

## 2023-05-05 NOTE — Progress Notes (Signed)
 Clinical research associate received inbound call from [place], [place] @ [facility]. Verbal report given.

## 2023-05-05 NOTE — Progress Notes (Signed)
 Inpatient Rehabilitation Admission Medication Review by a Pharmacist  A complete drug regimen review was completed for this patient to identify any potential clinically significant medication issues.  High Risk Drug Classes Is patient taking? Indication by Medication  Antipsychotic No   Anticoagulant Yes Xarelto - PE  Antibiotic No   Opioid Yes Norco- prn moderate pain Oxycodone - prn severe pain, SOB  Antiplatelet No   Hypoglycemics/insulin No   Vasoactive Medication Yes Tamsulosin - BPH  Chemotherapy No   Other Yes Brovana, Pulmicort, Duo-nebs, prednisone - ARF, COPD Ondansetron prn N/V Ibuprofen prn mild pain     Type of Medication Issue Identified Description of Issue Recommendation(s)  Drug Interaction(s) (clinically significant)     Duplicate Therapy     Allergy     No Medication Administration End Date     Incorrect Dose     Additional Drug Therapy Needed     Significant med changes from prior encounter (inform family/care partners about these prior to discharge).    Other       Clinically significant medication issues were identified that warrant physician communication and completion of prescribed/recommended actions by midnight of the next day:  No  Name of provider notified for urgent issues identified:   Provider Method of Notification:     Pharmacist comments: None  Time spent performing this drug regimen review (minutes):  20 minutes  Okey Regal, PharmD

## 2023-05-05 NOTE — Evaluation (Signed)
 Occupational Therapy Assessment and Plan  Patient Details  Name: Philip [name] MRN: [id number] Date of Birth: 1955/07/18  OT Diagnosis: {diagnoses:3041644} Rehab Potential:   ELOS:     {CHL IP REHAB OT TIME CALCULATIONS:304400400}    Hospital Problem: Principal Problem:   Acute respiratory failure (HCC)   Past Medical History:  Past Medical History:  Diagnosis Date   Chronic kidney disease    COPD (chronic obstructive pulmonary disease) (HCC)    Dyspnea    GERD (gastroesophageal reflux disease)    Headache    migraines   Hyperlipidemia    lung ca dx'd 12/2018   On home oxygen therapy    3 to 10 L   Pneumonia    Pulmonary embolus (HCC)    Tobacco abuse    Past Surgical History:  Past Surgical History:  Procedure Laterality Date   COLONOSCOPY     CYSTOSCOPY W/ URETERAL STENT PLACEMENT Right 10/24/2021   Procedure: CYSTOSCOPY WITH RETROGRADE PYELOGRAM/URETERAL STENT PLACEMENT;  Surgeon: Jannifer Hick, MD;  Location: [facility];  Service: Urology;  Laterality: Right;   CYSTOSCOPY WITH RETROGRADE PYELOGRAM, URETEROSCOPY AND STENT PLACEMENT Right 10/12/2022   Procedure: CYSTOSCOPY WITH RIGHT  RETROGRADE PYELOGRAM, RIGHT STENT EXCHANGE;  Surgeon: Jannifer Hick, MD;  Location: [facility];  Service: Urology;  Laterality: Right;   HAND SURGERY     VASECTOMY     VIDEO BRONCHOSCOPY WITH ENDOBRONCHIAL ULTRASOUND N/A 12/22/2018   Procedure: VIDEO BRONCHOSCOPY WITH ENDOBRONCHIAL ULTRASOUND;  Surgeon: Josephine Igo, DO;  Location: [facility];  Service: Thoracic;  Laterality: N/A;   VIDEO BRONCHOSCOPY WITH ENDOBRONCHIAL ULTRASOUND N/A 01/09/2019   Procedure: VIDEO BRONCHOSCOPY WITH ENDOBRONCHIAL ULTRASOUND WITH FLUORO;  Surgeon: Josephine Igo, DO;  Location: [facility];  Service: Thoracic;  Laterality: N/A;   VIDEO BRONCHOSCOPY WITH RADIAL ENDOBRONCHIAL ULTRASOUND N/A 12/22/2018   Procedure: RADIAL ENDOBRONCHIAL ULTRASOUND;  Surgeon: Josephine Igo, DO;  Location: [facility];  Service: Thoracic;   Laterality: N/A;    Assessment & Plan Clinical Impression: Patient is a 68 year old right-handed male with history of stage III adenocarcinoma of the lung previously received chemotherapy and immunotherapy back in 2020 and 2022, radiation-induced IPF, COPD/chronic hypoxia on 4-6 L nasal cannula, quit smoking 17 months ago, pulmonary MAI, pulmonary emboli maintained on Xarelto, GERD, chronic migraines, 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  Patient reports up until December 2023 he was able to work and was independent.  Presented to [facility]/18/2023 with increasing shortness of breath and right-sided chest pain x 2 weeks with sputum production.  Patient also endorsing dysuria and fatigue.  Patient had been scheduled for cystoscopy with right sided ureteral stent replacement 04/18/2023 but in the setting of acute hypoxic respiratory failure was admitted to the hospital.  Chest x-ray stable cardiomegaly with emphysematous lung disease and mild to moderate severity areas of bilateral linear scarring and/or atelectasis.  CT angiogram of the chest showed no pulmonary embolism.  Echocardiogram ejection fraction of 60 to 65% no wall motion abnormalities grade 1 diastolic dysfunction.  Initial treatment was initiated for COPD exacerbation along with CAP on steroids, bronchodilators as well as placed on Rocephin/azithromycin.  Eventually cultures on 3/20 resulted in Pseudomonas therefore pulmonary was consulted and antibiotics were changed to cefepime.  Patient was transition off IV steroids to p.o. prednisone. On chronic hydrocodone for back pain. .  Patient transferred to CIR on 05/05/2023 .    Patient currently requires {BJY:7829562} with {ZHY:8657846} secondary to {  impairments:3041632}.  Prior to hospitalization, patient could complete *** with {YQM:5784696}.  Patient will benefit from skilled intervention to {benefit of skilled intervention:3041641} prior  to discharge {discharge:3041642}.  Anticipate patient will require {supervision/assistance:22779} and {follow EX:5284132}.      OT Evaluation Precautions/Restrictions    Pain Pain Assessment Pain Scale: 0-10 Pain Score: 5  Pain Location: Flank Pain Intervention(s): Medication (See eMAR) Home Living/Prior Functioning Home Living Family/patient expects to be discharged to:: Private residence Living Arrangements: Spouse/significant other Vision   Perception    Praxis   Cognition   Sensation   Motor     Trunk/Postural Assessment     Balance   Extremity/Trunk Assessment      Care Tool Care Tool Self Care Eating        Oral Care         Bathing              Upper Body Dressing(including orthotics)            Lower Body Dressing (excluding footwear)          Putting on/Taking off footwear             Care Tool Toileting Toileting activity         Care Tool Bed Mobility Roll left and right activity        Sit to lying activity        Lying to sitting on side of bed activity         Care Tool Transfers Sit to stand transfer        Chair/bed transfer         Toilet transfer         Care Tool Cognition  Expression of Ideas and Wants    Understanding Verbal and Non-Verbal Content     Memory/Recall Ability     Refer to Care Plan for Long Term Goals  SHORT TERM GOAL WEEK 1    Recommendations for other services: {RECOMMENDATIONS FOR OTHER SERVICES:3049016}   Skilled Therapeutic Intervention ADL   Mobility    Skilled Intervention  Discharge Criteria: Patient will be discharged from OT if patient refuses treatment 3 consecutive times without medical reason, if treatment goals not met, if there is a change in medical status, if patient makes no progress towards goals or if patient is discharged from hospital.  The above assessment, treatment plan, treatment alternatives and goals were discussed and mutually agreed upon:  {Assessment/Treatment Plan Discussed/Agreed:3049017}  Limmie Patricia, OTR/L,CBIS  Supplemental OT - [facility] Secure Chat Preferred   05/05/2023, 7:49 PM

## 2023-05-05 NOTE — Progress Notes (Addendum)
 PMR Admission Coordinator Pre-Admission Assessment   Patient: Philip [name] is an 68 y.o., male MRN: [id number] DOB: Mar 15, 1955 Height: 5\' 7"  (170.2 cm) Weight: 90.7 kg   Insurance Information HMO:     PPO: yes      PCP:      IPA:      80/20:      OTHER:  PRIMARY: [facility]      Policy#: [id number]       Subscriber: Pt CM Name: Erling Cruz      Phone#: [phone]  or [phone]   Fax#:  [phone] Pt. Approved via fax 4/1 for admission 4/1-4/8.  Pre-Cert#:[id number]      Employer: pt Benefits:  Phone #:      Name:  Dolores Hoose Date: 02/03/2023 - still active  Deductible: $3,300 ($3,300 met)  OOP Max: $5,000 ($6,644.03 met)  CIR: 80% coverage; 20% coinsurance SNF: 80% coverage; 20% coinsurance Outpatient:  80% coverage; 20% coinsurance Home Health:  80% coverage; 20% coinsurance DME: 80% coverage; 20% coinsurance Providers: in network  SECONDARY: Medicare Part A       Policy#: [id number]      Phone#:    Financial Counselor:       Phone#:    The "Data Collection Information Summary" for patients in Inpatient Rehabilitation Facilities with attached "Privacy Act Statement-Health Care Records" was provided and verbally reviewed with: Patient   Emergency Contact Information Contact Information       Name Relation Home Work Mobile    Cairns,[name] Spouse [phone]   [phone]         Other Contacts   None on File        Current Medical History  Patient Admitting Diagnosis: Respiratory Failure  History of Present Illness: Philip [name] is a 68 year old right-handed male with history of stage III adenocarcinoma of the lung followed by Dr. 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 Jettie Pagan.  Per chart review patient lives with spouse.  1 level home 2 steps to entry.  Ambulatory in the home and short community distances.  Patient is O2 tank limited by cardiorespiratory endurance.  Patient reports up until December 2023 he was able to work and was independent.  Presented to [facility] 04/18/2023 with increasing shortness of breath and right-sided chest pain x 2 weeks with sputum production.  Patient also endorsing dysuria and fatigue.  Patient had been scheduled for cystoscopy with right sided ureteral stent replacement 04/18/2023 but in the setting of acute hypoxic respiratory failure was admitted to the hospital.  Chest x-ray stable cardiomegaly with emphysematous lung disease and mild to moderate severity areas of bilateral linear scarring and/or atelectasis.  CT angiogram of the chest showed no pulmonary embolism.  Admission chemistries unremarkable except chloride 97 CO2 33 glucose 106, urinalysis negative nitrite, strep pneumo urinary antigen negative.  Respiratory panel negative for COVID, RSV and flu.  Echocardiogram ejection fraction of 60 to 65% no wall motion abnormalities grade 1 diastolic dysfunction.  Initial treatment was initiated for COPD exacerbation along with CAP on steroids, bronchodilators as well as placed on Rocephin/azithromycin.  Eventually cultures on 3/20 resulted in Pseudomonas therefore pulmonary was consulted and antibiotics were changed to cefepime.  Patient was  transition off IV steroids to p.o. prednisone.  Patient currently remains on chronic Xarelto.  Palliative care following to establish goals of care.  Tolerating a regular consistency diet.  Therapy evaluations completed due to patient decreased functional mobility was admitted for a comprehensive rehab program.    Patient's medical record from [facility] has been reviewed by the rehabilitation admission coordinator and  physician.   Past Medical History      Past Medical History:  Diagnosis Date   Chronic kidney disease     COPD (chronic obstructive pulmonary disease) (HCC)     Dyspnea     GERD (gastroesophageal reflux disease)     Headache      migraines   Hyperlipidemia     lung ca dx'd 12/2018   On home oxygen therapy      3 to 10 L   Pneumonia     Pulmonary embolus (HCC)     Tobacco abuse            Has the patient had major surgery during 100 days prior to admission? No   Family History   family history includes Cancer in his father; Stomach cancer in his father.   Current Medications  Current Medications    Current Facility-Administered Medications:    alum & mag hydroxide-simeth (MAALOX/MYLANTA) 200-200-20 MG/5ML suspension 15 mL, 15 mL, Oral, Q4H PRN, Danford, Earl Lites, MD, 15 mL at 05/02/23 0904   arformoterol (BROVANA) nebulizer solution 15 mcg, 15 mcg, Nebulization, BID, Lewie Chamber, MD, 15 mcg at 05/02/23 2956   bisacodyl (DULCOLAX) suppository 10 mg, 10 mg, Rectal, Daily PRN, Krugh, Marissa C, DO   budesonide (PULMICORT) nebulizer solution 0.5 mg, 0.5 mg, Nebulization, BID, Desai, Rahul P, [name], 0.5 mg at 05/02/23 2130   guaiFENesin (MUCINEX) 12 hr tablet 600 mg, 600 mg, Oral, BID, Sundil, Subrina, MD, 600 mg at 05/02/23 0905   guaiFENesin-dextromethorphan (ROBITUSSIN DM) 100-10 MG/5ML syrup 5 mL, 5 mL, Oral, Q4H PRN, Danford, Earl Lites, MD, 5 mL at 04/24/23 2348   hydrALAZINE (APRESOLINE) injection 10 mg, 10 mg, Intravenous, Q4H PRN, Amin, Ankit C, MD   HYDROcodone-acetaminophen (NORCO/VICODIN) 5-325 MG per tablet 1 tablet, 1 tablet, Oral, Q12H PRN, Lewie Chamber, MD, 1 tablet at 05/01/23 1625   ibuprofen (ADVIL) tablet 400 mg, 400 mg, Oral, Q6H PRN, Janalyn Shy, Subrina, MD, 400 mg at 04/20/23 0903   ipratropium-albuterol (DUONEB) 0.5-2.5 (3) MG/3ML nebulizer solution 3 mL, 3 mL, Nebulization, Q4H PRN, Janalyn Shy, Subrina, MD, 3 mL at 04/19/23 0225    ipratropium-albuterol (DUONEB) 0.5-2.5 (3) MG/3ML nebulizer solution 3 mL, 3 mL, Nebulization, QID, Lewie Chamber, MD, 3 mL at 05/02/23 1157   metoprolol tartrate (LOPRESSOR) injection 5 mg, 5 mg, Intravenous, Q4H PRN, Amin, Ankit C, MD   ondansetron (ZOFRAN) tablet 4 mg, 4 mg, Oral, Q6H PRN **OR** ondansetron (ZOFRAN) injection 4 mg, 4 mg, Intravenous, Q6H PRN, Janalyn Shy, Subrina, MD, 4 mg at 05/02/23 0103   Oral care mouth rinse, 15 mL, Mouth Rinse, PRN, Lewie Chamber, MD   oxyCODONE (ROXICODONE) 5 MG/5ML solution 1.5 mg, 1.5 mg, Oral, Q4H PRN, Amin, Ankit C, MD, 1.5 mg at 05/02/23 1307   polyethylene glycol (MIRALAX / GLYCOLAX) packet 17 g, 17 g, Oral, BID, Danford, Earl Lites, MD, 17 g at 05/02/23 0904   [COMPLETED] predniSONE (DELTASONE) tablet 40 mg, 40 mg, Oral, Q breakfast, 40 mg at 05/02/23 0905 **FOLLOWED BY** [START ON 05/03/2023] predniSONE (DELTASONE) tablet 30 mg, 30 mg, Oral, Q breakfast **FOLLOWED BY** [START ON  05/07/2023] predniSONE (DELTASONE) tablet 20 mg, 20 mg, Oral, Q breakfast **FOLLOWED BY** [START ON 05/11/2023] predniSONE (DELTASONE) tablet 10 mg, 10 mg, Oral, Q breakfast, Amin, Ankit C, MD   rivaroxaban (XARELTO) tablet 20 mg, 20 mg, Oral, Q supper, Sundil, Subrina, MD, 20 mg at 05/01/23 1624   senna-docusate (Senokot-S) tablet 1 tablet, 1 tablet, Oral, QHS, Danford, Earl Lites, MD, 1 tablet at 05/01/23 2106   senna-docusate (Senokot-S) tablet 1 tablet, 1 tablet, Oral, QHS PRN, Amin, Ankit C, MD, 1 tablet at 04/30/23 2234   sodium chloride flush (NS) 0.9 % injection 3 mL, 3 mL, Intravenous, Q12H, Sundil, Subrina, MD, 3 mL at 05/02/23 0905   sodium chloride flush (NS) 0.9 % injection 3 mL, 3 mL, Intravenous, Q12H, Sundil, Subrina, MD, 3 mL at 05/02/23 3086   sodium chloride flush (NS) 0.9 % injection 3 mL, 3 mL, Intravenous, PRN, Janalyn Shy, Subrina, MD   tamsulosin [facility]) capsule 0.4 mg, 0.4 mg, Oral, QHS, Sundil, Subrina, MD, 0.4 mg at 05/01/23 2106     Patients Current  Diet:  Diet Order                  Diet regular Fluid consistency: Thin  Diet effective now                         Precautions / Restrictions Precautions Precautions: Fall Precaution/Restrictions Comments: Pt active in the community PTA Restrictions Weight Bearing Restrictions Per Provider Order: No    Has the patient had 2 or more falls or a fall with injury in the past year? No   Prior Activity Level Community (5-7x/wk): Pt. is acitve in the community   Prior Functional Level Self Care: Did the patient need help bathing, dressing, using the toilet or eating? Independent   Indoor Mobility: Did the patient need assistance with walking from room to room (with or without device)? Independent   Stairs: Did the patient need assistance with internal or external stairs (with or without device)? Needed some help   Functional Cognition: Did the patient need help planning regular tasks such as shopping or remembering to take medications? Independent   Patient Information Are you of Hispanic, Latino/a,or Spanish origin?: A. No, not of Hispanic, Latino/a, or Spanish origin What is your race?: A. White Do you need or want an interpreter to communicate with a doctor or health care staff?: 2. No   Patient's Response To:  Health Literacy and Transportation Is the patient able to respond to health literacy and transportation needs?: Yes Health Literacy - How often do you need to have someone help you when you read instructions, pamphlets, or other written material from your doctor or pharmacy?: Never In the past 12 months, has lack of transportation kept you from medical appointments or from getting medications?: No In the past 12 months, has lack of transportation kept you from meetings, work, or from getting things needed for daily living?: No   Journalist, newspaper / Equipment Home Equipment: Shower seat, Grab bars - tub/shower   Prior Device Use: Indicate devices/aids used by  the patient prior to current illness, exacerbation or injury? None of the above   Current Functional Level Cognition   Orientation Level: Oriented X4    Extremity Assessment (includes Sensation/Coordination)   Upper Extremity Assessment: Overall WFL for tasks assessed  Lower Extremity Assessment: Defer to PT evaluation RLE Deficits / Details: ROM WFL; MMT 5/5 but fatigues LLE Deficits / Details: ROM WFL;  MMT 5/5 but fatigues     ADLs   Overall ADL's : Needs assistance/impaired Eating/Feeding: Modified independent, Sitting Grooming: Sitting, Set up Upper Body Bathing: Sitting, Contact guard assist Lower Body Bathing: Minimal assistance, Sitting/lateral leans Lower Body Bathing Details (indicate cue type and reason): able to complete figure four but educated on not holding breath to get onto lap. Upper Body Dressing : Sitting, Set up Lower Body Dressing: Sitting/lateral leans, Minimal assistance Lower Body Dressing Details (indicate cue type and reason): educated on LB Dressing AE to reduce SOB. patient agreed to look at these during next session. Toilet Transfer: Minimal assistance, Tax adviser Details (indicate cue type and reason): to recliner in room with patient dropping ot 84% on 8L/min with education on deep breathing and positioning to return stats to 90s. Toileting- Clothing Manipulation and Hygiene: Sitting/lateral lean, Minimal assistance Toileting - Clothing Manipulation Details (indicate cue type and reason): education on toileting buddy and bidets.     Mobility   Overal bed mobility: Needs Assistance Bed Mobility: Supine to Sit Supine to sit: Supervision Sit to supine: Supervision     Transfers   Overall transfer level: Needs assistance Equipment used: None Transfers: Sit to/from Stand Sit to Stand: Contact guard assist     Ambulation / Gait / Stairs / Wheelchair Mobility   Ambulation/Gait Ambulation/Gait assistance: Contact guard assist Gait  Distance (Feet): 25 Feet (ambulated 25'x2) Assistive device:  (O2 tank) Gait Pattern/deviations: Step-through pattern General Gait Details: Ambulated out in hall 25' and then stood for recovery 3 mins then back 25' into room . CGA for safety Gait velocity: decreased     Posture / Balance Balance Overall balance assessment: Needs assistance Sitting-balance support: No upper extremity supported Sitting balance-Leahy Scale: Good Standing balance support: No upper extremity supported Standing balance-Leahy Scale: Good     Special needs/care consideration Special service needs oxygen **    Previous Home Environment (from acute therapy documentation) Living Arrangements: Spouse/significant other Available Help at Discharge: Family, Available 24 hours/day Type of Home: House Home Layout: One level Home Access: Stairs to enter Entrance Stairs-Rails: None Entrance Stairs-Number of Steps: 2 Bathroom Shower/Tub: Engineer, manufacturing systems: Standard Bathroom Accessibility: Yes How Accessible: Accessible via walker Home Care Services: Yes Type of Home Care Services: Other (Comment) (respiratory therapy) Additional Comments: On 6 L O2 at home   Discharge Living Setting Plans for Discharge Living Setting: Patient's home Type of Home at Discharge: House Discharge Home Layout: One level Discharge Home Access: Stairs to enter Entrance Stairs-Rails: None Entrance Stairs-Number of Steps: 2 Discharge Bathroom Shower/Tub: Tub/shower unit Discharge Bathroom Toilet: Standard Discharge Bathroom Accessibility: Yes How Accessible: Accessible via walker Does the patient have any problems obtaining your medications?: No   Social/Family/Support Systems Patient Roles: Spouse Contact Information: [phone] Anticipated Caregiver: Thos Matsumoto Ability/Limitations of Caregiver: Min A Caregiver Availability: 24/7 Is Caregiver In Agreement with Plan?: Yes Does Caregiver/Family have Issues  with Lodging/Transportation while Pt is in Rehab?: Yes   Goals Patient/Family Goal for Rehab: PT/OT mod I Expected length of stay: 7-10 days Pt/Family Agrees to Admission and willing to participate: Yes Program Orientation Provided & Reviewed with Pt/Caregiver Including Roles  & Responsibilities: Yes   Decrease burden of Care through IP rehab admission:  not anticipated   Possible need for SNF placement upon discharge: not anticipated    Patient Condition: I have reviewed medical records from [facility], spoken with CM, and patient. I met with patient at the bedside for inpatient rehabilitation  assessment.  Patient will benefit from ongoing PT, OT, and SLP, can actively participate in 3 hours of therapy a day 5 days of the week, and can make measurable gains during the admission.  Patient will also benefit from the coordinated team approach during an Inpatient Acute Rehabilitation admission.  The patient will receive intensive therapy as well as Rehabilitation physician, nursing, social worker, and care management interventions.  Due to skin/wound care, disease management, medication administration, pain management, and patient education the patient requires 24 hour a day rehabilitation nursing.  The patient is currently min A to CGA with mobility and basic ADLs.  Discharge setting and therapy post discharge at home with home health is anticipated.  Patient has agreed to participate in the Acute Inpatient Rehabilitation Program and will admit today.   Preadmission Screen Completed By:  Jeronimo Greaves, 05/02/2023 2:46 PM ______________________________________________________________________   Discussed status with Dr. Natale Lay on 05/05/23 at 900 and received approval for admission today.   Admission Coordinator:  Jeronimo Greaves, CCC-SLP, time 922/Date 05/05/23    Assessment/Plan: Diagnosis: Respiratory failure Does the need for close, 24 hr/day Medical supervision in concert with the  patient's rehab needs make it unreasonable for this patient to be served in a less intensive setting? Yes Co-Morbidities requiring supervision/potential complications: Pneumonia, COPD, non-small cell lung cancer, IPF, chronic Pulmonary embolism on xarelto, BPH Due to bladder management, bowel management, safety, skin/wound care, disease management, medication administration, pain management, and patient education, does the patient require 24 hr/day rehab nursing? Yes Does the patient require coordinated care of a physician, rehab nurse, PT, OT, and SLP to address physical and functional deficits in the context of the above medical diagnosis(es)? Yes Addressing deficits in the following areas: balance, endurance, locomotion, strength, transferring, bowel/bladder control, bathing, dressing, feeding, grooming, toileting, cognition, speech, language, swallowing, and psychosocial support Can the patient actively participate in an intensive therapy program of at least 3 hrs of therapy 5 days a week? Yes The potential for patient to make measurable gains while on inpatient rehab is excellent Anticipated functional outcomes upon discharge from inpatient rehab: modified independent PT, modified independent OT, n/a SLP Estimated rehab length of stay to reach the above functional goals is: 7-10 Anticipated discharge destination: Home 10. Overall Rehab/Functional Prognosis: excellent     MD Signature: Fanny Dance

## 2023-05-05 NOTE — Progress Notes (Signed)
 Patient ID: Philip [name], male   DOB: 28-Mar-1955, 68 y.o.   MRN: [id number]  INPATIENT REHABILITATION ADMISSION NOTE   Arrival Method: Stretcher     Mental Orientation: A+Ox4    Assessment: complete see flowsheets   Skin: bruising to arms  IV'S: R) FA   Pain: none   Tubes and Drains: none   Safety Measures: Bed alarm on, in low position, call light in reach   Vital Signs: Obtained   Height and Weight: complete   Rehab Orientation: reviewed and oriented, pt in agreement   Family: Aware of admission    Notes: N/A

## 2023-05-05 NOTE — Telephone Encounter (Signed)
 Scheduled appointments and left a voicemail with appointment details.

## 2023-05-06 ENCOUNTER — Telehealth: Payer: Self-pay | Admitting: Internal Medicine

## 2023-05-06 DIAGNOSIS — K5900 Constipation, unspecified: Secondary | ICD-10-CM | POA: Diagnosis not present

## 2023-05-06 DIAGNOSIS — D72829 Elevated white blood cell count, unspecified: Secondary | ICD-10-CM | POA: Diagnosis not present

## 2023-05-06 DIAGNOSIS — M545 Low back pain, unspecified: Secondary | ICD-10-CM | POA: Diagnosis not present

## 2023-05-06 DIAGNOSIS — J9600 Acute respiratory failure, unspecified whether with hypoxia or hypercapnia: Secondary | ICD-10-CM | POA: Diagnosis not present

## 2023-05-06 LAB — COMPREHENSIVE METABOLIC PANEL WITH GFR
ALT: 26 U/L (ref 0–44)
AST: 16 U/L (ref 15–41)
Albumin: 2.7 g/dL — ABNORMAL LOW (ref 3.5–5.0)
Alkaline Phosphatase: 47 U/L (ref 38–126)
Anion gap: 9 (ref 5–15)
BUN: 22 mg/dL (ref 8–23)
CO2: 32 mmol/L (ref 22–32)
Calcium: 8.3 mg/dL — ABNORMAL LOW (ref 8.9–10.3)
Chloride: 98 mmol/L (ref 98–111)
Creatinine, Ser: 1.06 mg/dL (ref 0.61–1.24)
GFR, Estimated: >60 mL/min (ref >60)
Glucose, Bld: 96 mg/dL (ref 70–99)
Potassium: 4.3 mmol/L (ref 3.5–5.1)
Sodium: 139 mmol/L (ref 135–145)
Total Bilirubin: 0.8 mg/dL (ref 0.0–1.2)
Total Protein: 5.4 g/dL — ABNORMAL LOW (ref 6.5–8.1)

## 2023-05-06 LAB — CBC WITH DIFFERENTIAL/PLATELET
Abs Immature Granulocytes: 0.08 10*3/uL — ABNORMAL HIGH (ref 0.00–0.07)
Basophils Absolute: 0 10*3/uL (ref 0.0–0.1)
Basophils Relative: 0 %
Eosinophils Absolute: 0 10*3/uL (ref 0.0–0.5)
Eosinophils Relative: 1 %
HCT: 32.9 % — ABNORMAL LOW (ref 39.0–52.0)
Hemoglobin: 10.5 g/dL — ABNORMAL LOW (ref 13.0–17.0)
Immature Granulocytes: 1 %
Lymphocytes Relative: 7 %
Lymphs Abs: 0.5 10*3/uL — ABNORMAL LOW (ref 0.7–4.0)
MCH: 30 pg (ref 26.0–34.0)
MCHC: 31.9 g/dL (ref 30.0–36.0)
MCV: 94 fL (ref 80.0–100.0)
Monocytes Absolute: 0.5 10*3/uL (ref 0.1–1.0)
Monocytes Relative: 7 %
Neutro Abs: 6.7 10*3/uL (ref 1.7–7.7)
Neutrophils Relative %: 84 %
Platelets: 157 10*3/uL (ref 150–400)
RBC: 3.5 MIL/uL — ABNORMAL LOW (ref 4.22–5.81)
RDW: 15.6 % — ABNORMAL HIGH (ref 11.5–15.5)
WBC: 7.8 10*3/uL (ref 4.0–10.5)
nRBC: 0 % (ref 0.0–0.2)

## 2023-05-06 MED ORDER — MENTHOL 3 MG MT LOZG: 1.0000 | LOZENGE | OROMUCOSAL | Status: DC | PRN

## 2023-05-06 MED ORDER — FUROSEMIDE 20 MG PO TABS
20.0000 mg | ORAL_TABLET | Freq: Every day | ORAL | Status: DC
  Administered 2023-05-06 – 2023-05-09 (×4): 20 mg via ORAL
  Filled 2023-05-06 (×4): qty 1

## 2023-05-06 MED ORDER — SORBITOL 70 % SOLN
30.0000 mL | Freq: Every day | Status: DC | PRN
  Administered 2023-05-06: 30 mL via ORAL
  Filled 2023-05-06: qty 30

## 2023-05-06 MED ORDER — BIOTENE DRY MOUTH MT LIQD: 15.0000 mL | OROMUCOSAL | Status: DC | PRN

## 2023-05-06 MED ORDER — MENTHOL 3 MG MT LOZG
1.0000 | LOZENGE | OROMUCOSAL | Status: DC | PRN
  Administered 2023-05-06: 3 mg via ORAL
  Filled 2023-05-06: qty 9

## 2023-05-06 NOTE — Telephone Encounter (Signed)
 Left a voicemail with canceled appointment details. Appointments were canceled due to provider not needing them.

## 2023-05-06 NOTE — Progress Notes (Signed)
 Patient has requested PRN pain medication twice this shift. He reports flank pain that is well controlled with oral PRN medication. He has slept well. He is using the urinal and continues to have dark red colored urine. He has a strong cough. He remains SOB with excerption but quickly recovers with rest.  No concerns at this time.

## 2023-05-06 NOTE — Evaluation (Signed)
 Physical Therapy Assessment and Plan  Patient Details  Name: Philip [name] MRN: [id number] Date of Birth: Jun 26, 1955  PT Diagnosis: Abnormal posture, Difficulty walking, Edema, Impaired sensation, and Muscle weakness Rehab Potential: Fair ELOS: 10-14 days   Today's Date: 05/06/2023 PT Individual Time: 1015-1120, 4098-1191 PT Individual Time Calculation (min): 65 min, 56 min   and Today's Date: 05/06/2023 PT Missed Time: 10 Minutes Missed Time Reason: Patient fatigue   Hospital Problem: Principal Problem:   Acute respiratory failure (HCC)   Past Medical History:  Past Medical History:  Diagnosis Date   Chronic kidney disease    COPD (chronic obstructive pulmonary disease) (HCC)    Dyspnea    GERD (gastroesophageal reflux disease)    Headache    migraines   Hyperlipidemia    lung ca dx'd 12/2018   On home oxygen therapy    3 to 10 L   Pneumonia    Pulmonary embolus (HCC)    Tobacco abuse    Past Surgical History:  Past Surgical History:  Procedure Laterality Date   COLONOSCOPY     CYSTOSCOPY W/ URETERAL STENT PLACEMENT Right 10/24/2021   Procedure: CYSTOSCOPY WITH RETROGRADE PYELOGRAM/URETERAL STENT PLACEMENT;  Surgeon: Philip Hick, MD;  Location: [facility];  Service: Urology;  Laterality: Right;   CYSTOSCOPY WITH RETROGRADE PYELOGRAM, URETEROSCOPY AND STENT PLACEMENT Right 10/12/2022   Procedure: CYSTOSCOPY WITH RIGHT  RETROGRADE PYELOGRAM, RIGHT STENT EXCHANGE;  Surgeon: Philip Hick, MD;  Location: [facility];  Service: Urology;  Laterality: Right;   HAND SURGERY     VASECTOMY     VIDEO BRONCHOSCOPY WITH ENDOBRONCHIAL ULTRASOUND N/A 12/22/2018   Procedure: VIDEO BRONCHOSCOPY WITH ENDOBRONCHIAL ULTRASOUND;  Surgeon: Philip Igo, DO;  Location: [facility];  Service: Thoracic;  Laterality: N/A;   VIDEO BRONCHOSCOPY WITH ENDOBRONCHIAL ULTRASOUND N/A 01/09/2019   Procedure: VIDEO BRONCHOSCOPY WITH ENDOBRONCHIAL ULTRASOUND WITH FLUORO;  Surgeon: Philip Igo, DO;  [facility];  Service: Thoracic;  Laterality: N/A;   VIDEO BRONCHOSCOPY WITH RADIAL ENDOBRONCHIAL ULTRASOUND N/A 12/22/2018   Procedure: RADIAL ENDOBRONCHIAL ULTRASOUND;  Surgeon: Philip Igo, DO;  Location: [facility];  Service: Thoracic;  Laterality: N/A;    Assessment & Plan Clinical Impression: Patient is a 68 y.o. 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 Jettie [name].  Per chart review patient lives with Philip.  1 level home 2 steps to entry.  Ambulatory in the home and short community distances.  Patient is O2 tank limited by cardiorespiratory endurance.  Patient reports up until December 2023 he was able to work and was independent.  Presented to [facility] 04/18/2023 with increasing shortness of breath and right-sided chest pain x 2 weeks with sputum production.  Patient also endorsing dysuria and fatigue.  Patient had been scheduled for cystoscopy with right sided ureteral stent replacement 04/18/2023 but in the setting of acute hypoxic respiratory failure was admitted to the hospital.  Chest x-ray stable cardiomegaly with emphysematous lung disease and mild to moderate severity areas of bilateral linear scarring and/or atelectasis.  CT angiogram of the chest showed no pulmonary embolism.  Admission chemistries unremarkable except chloride 97 CO2  33 glucose 106, urinalysis negative nitrite, strep pneumo urinary antigen negative.  Respiratory panel negative for COVID, RSV and flu.  Echocardiogram ejection fraction of 60 to 65% no wall motion abnormalities grade 1 diastolic dysfunction.  Initial  treatment was initiated for COPD exacerbation along with CAP on steroids, bronchodilators as well as placed on Rocephin/azithromycin.  Eventually cultures on 3/20 resulted in Pseudomonas therefore pulmonary was consulted and antibiotics were changed to cefepime.  Patient was transition off IV steroids to p.o. prednisone.  Patient currently remains on chronic Xarelto.  Palliative care following to establish goals of care.  Tolerating a regular consistency diet.  Patient reports he initially had constipation, now improved after several bowel movements.  On chronic hydrocodone for back pain.  Therapy evaluations completed due to patient decreased functional mobility was admitted for a comprehensive rehab program.    Patient currently requires supervision with mobility secondary to muscle weakness, decreased cardiorespiratoy endurance and decreased oxygen support, and decreased standing balance and decreased balance strategies.  Prior to hospitalization, patient was modified independent  with mobility and lived with Philip [name]) in a House home.  Home access is 2Stairs to enter.  Patient will benefit from skilled PT intervention to maximize safe functional mobility, minimize fall risk, and decrease caregiver burden for planned discharge home with intermittent assist.  Anticipate patient will benefit from follow up [facility] at discharge.  PT - End of Session Activity Tolerance: Tolerates < 10 min activity with changes in vital signs Endurance Deficit: Yes Endurance Deficit Description: impaired. At rest in [facility], pt on 6L O2 with SpO2 stable 90-91%. Once out of bed and participation in self care task seated or standing, SpO2 dropped to low-mid 70's at 6L. SpO2 increased to 88-91% with seated rest break in WC, with combination of diaphragmatic breathing and pep flutter valve. PT Assessment Rehab Potential (ACUTE/IP ONLY): Fair PT Barriers to Discharge: (!) Pending surgery;Inaccessible home environment;Home  environment access/layout;Weight PT Patient demonstrates impairments in the following area(s): Balance;Endurance;Edema;Safety;Sensory PT Transfers Functional Problem(s): Bed Mobility;Bed to Chair;Car PT Locomotion Functional Problem(s): Ambulation;Wheelchair Mobility;Stairs PT Plan PT Intensity: Minimum of 1-2 x/day ,45 to 90 minutes PT Frequency: 5 out of 7 days PT Duration Estimated Length of Stay: 10-14 days PT Treatment/Interventions: Ambulation/gait training;Community reintegration;DME/adaptive equipment instruction;Neuromuscular re-education;Psychosocial support;Stair training;UE/LE Strength taining/ROM;Wheelchair propulsion/positioning;Balance/vestibular training;Discharge planning;Pain management;Skin care/wound management;Therapeutic Activities;UE/LE Coordination activities;Cognitive remediation/compensation;Disease management/prevention;Functional mobility training;Patient/family education;Splinting/orthotics;Therapeutic Exercise;Visual/perceptual remediation/compensation PT Transfers Anticipated Outcome(s): mod I PT Locomotion Anticipated Outcome(s): sup PT Recommendation Recommendations for Other Services: Therapeutic Recreation consult;Neuropsych consult Follow Up Recommendations: Home health PT Patient destination: Home Equipment Recommended: To be determined   PT Evaluation Precautions/Restrictions Precautions Precautions: Fall;Other (comment) Recall of Precautions/Restrictions: Intact Precaution/Restrictions Comments: Monitor O2. Goal: 88% or greater on 6L Restrictions Weight Bearing Restrictions Per Provider Order: No Pain Interference Pain Interference Pain Effect on Sleep: 3. Frequently Pain Interference with Therapy Activities: 1. Rarely or not at all Pain Interference with Day-to-Day Activities: 3. Frequently Home Living/Prior Functioning Home Living Living Arrangements: Philip/significant other Available Help at Discharge: Family;Available 24 hours/day Type of  Home: House Home Access: Stairs to enter [employer] of Steps: 2 Entrance Stairs-Rails: None Home Layout: One level Bathroom Shower/Tub: Engineer, manufacturing systems: Standard Bathroom Accessibility: Yes Additional Comments: Uses 6 L O2 at baseline. Has home oxygen concentrator plus large portable tanks.  Lives With: Philip [name]) Prior Function Level of Independence: Other (comment) (pt reports using oxygen tank for balance)  Able to Take Stairs?: Yes ("I can but not well") Driving: Yes (  I have probably driven 2x/year since Dec 2023) Vocation: Full time employment Vocation Requirements: works remotely as of right now for [employer] working Scientist, product/process development support, training new employees, Licensed conveyancer, Catering manager. Vision/Perception  Vision - History Ability to See in Adequate Light: 0 Adequate Perception Perception: Within Functional Limits Praxis Praxis: WFL  Cognition Overall Cognitive Status: Within Functional Limits for tasks assessed Arousal/Alertness: Awake/alert Orientation Level: Oriented X4 Memory: Appears intact Awareness: Appears intact Problem Solving: Appears intact Safety/Judgment: Appears intact Sensation Sensation Light Touch: Impaired by gross assessment Hot/Cold: Appears Intact Proprioception: Appears Intact Stereognosis: Appears Intact Additional Comments: pt reports he has baseline N&T and soreness--improved with lasix, pt able to detect light touch in all dermaomtes but reports diminished B Coordination Gross Motor Movements are Fluid and Coordinated: Yes Fine Motor Movements are Fluid and Coordinated: Yes Finger Nose Finger Test: NT 9 Hole Peg Test: NT Motor  Motor Motor: Within Functional Limits  Trunk/Postural Assessment  Cervical Assessment Cervical Assessment: Exceptions to [facility] (forward head) Thoracic Assessment Thoracic Assessment: Exceptions to [facility] Lumbar Assessment Lumbar Assessment: Within Functional Limits Postural  Control Postural Control: Within Functional Limits  Balance Balance Balance Assessed: Yes Dynamic Sitting Balance Dynamic Sitting - Balance Support: No upper extremity supported;Feet supported Dynamic Sitting - Level of Assistance: 5: Stand by assistance Static Standing Balance Static Standing - Balance Support: Left upper extremity supported;Right upper extremity supported;During functional activity Static Standing - Level of Assistance: 5: Stand by assistance Dynamic Standing Balance Dynamic Standing - Balance Support: Left upper extremity supported;Right upper extremity supported;During functional activity Dynamic Standing - Level of Assistance: 4: Min assist;5: Stand by assistance Extremity Assessment  RUE Assessment RUE Assessment: Within Functional Limits Active Range of Motion (AROM) Comments: A/ROM WFL in all ranges General Strength Comments: 4/5 grossly in shoulder and elbow ranges. Intact gross grasp. LUE Assessment LUE Assessment: Within Functional Limits Active Range of Motion (AROM) Comments: WFL in all ranges General Strength Comments: 4/5 grossly in shoulder and elbow ranges. Intact gross grasp. RLE Assessment RLE Assessment: Exceptions to [facility] General Strength Comments: grossly 3+/5 LLE Assessment LLE Assessment: Exceptions to [facility] General Strength Comments: grossly 3+/5  Care Tool Care Tool Bed Mobility Roll left and right activity   Roll left and right assist level: Supervision/Verbal cueing    Sit to lying activity   Sit to lying assist level: Supervision/Verbal cueing    Lying to sitting on side of bed activity   Lying to sitting on side of bed assist level: the ability to move from lying on the back to sitting on the side of the bed with no back support.: Minimal Assistance - Patient > 75%     Care Tool Transfers Sit to stand transfer   Sit to stand assist level: Contact Guard/Touching assist    Chair/bed transfer   Chair/bed transfer assist level:  Contact Guard/Touching assist    Car transfer Car transfer activity did not occur: Safety/medical concerns        Care Tool Locomotion Ambulation Ambulation activity did not occur: Safety/medical concerns        Walk 10 feet activity Walk 10 feet activity did not occur: Safety/medical concerns       Walk 50 feet with 2 turns activity Walk 50 feet with 2 turns activity did not occur: Safety/medical concerns      Walk 150 feet activity Walk 150 feet activity did not occur: Safety/medical concerns      Walk 10 feet on uneven surfaces activity Walk 10 feet on  uneven surfaces activity did not occur: Safety/medical concerns      Stairs Stair activity did not occur: Safety/medical concerns        Walk up/down 1 step activity Walk up/down 1 step or curb (drop down) activity did not occur: Safety/medical concerns      Walk up/down 4 steps activity Walk up/down 4 steps activity did not occur: Safety/medical concerns      Walk up/down 12 steps activity Walk up/down 12 steps activity did not occur: Safety/medical concerns      Pick up small objects from floor Pick up small object from the floor (from standing position) activity did not occur: Safety/medical concerns      Wheelchair Is the patient using a wheelchair?: Yes Type of Wheelchair: Manual   Wheelchair assist level: Dependent - Patient 0%    Wheel 50 feet with 2 turns activity   Assist Level: Dependent - Patient 0%  Wheel 150 feet activity   Assist Level: Dependent - Patient 0%    Refer to Care Plan for Long Term Goals  SHORT TERM GOAL WEEK 1 PT Short Term Goal 1 (Week 1): pt will tolerate sitting OOB between sessions PT Short Term Goal 2 (Week 1): pt will ambulate 25 feet with LRAD with continuous O2 montioring  Recommendations for other services: Neuropsych and Therapeutic Recreation  Stress management  Skilled Therapeutic Intervention Mobility Bed Mobility Bed Mobility: Sit to Supine Supine to Sit:  Contact Guard/Touching assist Sit to Supine: Minimal Assistance - Patient > 75% Transfers Transfers: Sit to Stand;Stand to Sit;Stand Pivot Transfers Sit to Stand: Contact Guard/Touching assist Stand to Sit: Contact Guard/Touching assist Stand Pivot Transfers: Contact Guard/Touching assist Transfer (Assistive device): None Locomotion  Gait Ambulation: No Gait Gait: No Stairs / Additional Locomotion Stairs: No Wheelchair Mobility Wheelchair Mobility: No   Discharge Criteria: Patient will be discharged from PT if patient refuses treatment 3 consecutive times without medical reason, if treatment goals not met, if there is a change in medical status, if patient makes no progress towards goals or if patient is discharged from hospital.  The above assessment, treatment plan, treatment alternatives and goals were discussed and mutually agreed upon: by patient  Today's Interventions   Pt seated in WC upon arrival. Pt agreeable to therapy. Pt denies any pain.   Evaluation completed (see details above and below) with education on PT POC and goals and individual treatment initiated with focus on activity tolerance.   Pt on 6LO2 with SpO2 at 90-91 at rest. Pt SpO2 dropped to low to mid 70s at 6L with participation in use of urinal while seated in WC, and to low 80s with short bouts of seated/standing therex. SpO2 increased to 88-91% with seated rest break in WC, with combination of diaphragmatic breathing and pep flutter valve.   Pt performed the following activities with prolonged seated rest breaks for management of oxygen saturation.    1x5 LAQ B  1x5 seated alternating marching B   1x5 heel/toe raises B  Seated hip abduction x5 B   Sit to stand 2x1   Standing marching x3 B  Seated horizontal abduction to open chest airway x5    Pt performed sit to stand and stand pivot transfer with no AD and CGA.   Pt supine in bed at end of session with SpO2 at 90% on 6L, pt bed alarm on and needs  within reach.   Pt missed 10 min 2/2 fatigue. Therapist will attempt to make up missed minutes as  able.    Treatment Session 2   Pt supine in bed upon arrival. Pt agreeable to therapy. Pt reports back pain at site of kidney stent, 3/10, premedicated. Therapist provided rest breaks and repositioning throughout session. Pt reports sore throat and requesting throat losenges. Notified nurse.   Pt on continuous O2 monitoring; pt on 6LO2 with SpO2 82-93% with exception of desatting to 75% with glute bridge. Pt required prolonged seated rest with diaphragmatic breathing and use of pep flutter valve to increase SpO2 to 90%.   Pt performed the following therex for BLE strengthening/activity tolerance/ednurance:  1x10 ankle pumps B  1x10 heel slides B  1x5 glute bridge --pt required prolonged rest break 2/2 SpO2 dessating to 75% --increased to 90% with rest break and gradual increase in oxygen concentration to 10L, reduced to 6L after pt SpO2 level reached 90% 1x5 seated LAQ B  1x10 seated rows 1x5 seated alternating marching B   Pt coughing intermittently throughout session, and using suction to clear secretions.   Pt performed stand pivot transfer bed to recliner with no AD and CGA.   Pt performed sit to stand 2x2 from recliner with close supervision, with rest break between, pt O2 decreased to 82, but quickly increased to 89 with rest break between sets with use of pep flutter valve.   Pt seated in recliner at end of session with all needs within reach and chair alarm on.     [facility] [place], Beecher, DPT  05/06/2023, 1:57 PM

## 2023-05-06 NOTE — Progress Notes (Signed)
 Inpatient Rehabilitation Care Coordinator Assessment and Plan Patient Details  Name: KEYION KNACK MRN: [id number] Date of Birth: 02/16/1955  Today's Date: 05/06/2023  Hospital Problems: Principal Problem:   Acute respiratory failure [facility])  Past Medical History:  Past Medical History:  Diagnosis Date   Chronic kidney disease    COPD (chronic obstructive pulmonary disease) (HCC)    Dyspnea    GERD (gastroesophageal reflux disease)    Headache    migraines   Hyperlipidemia    lung ca dx'd 12/2018   On home oxygen therapy    3 to 10 L   Pneumonia    Pulmonary embolus (HCC)    Tobacco abuse    Past Surgical History:  Past Surgical History:  Procedure Laterality Date   COLONOSCOPY     CYSTOSCOPY W/ URETERAL STENT PLACEMENT Right 10/24/2021   Procedure: CYSTOSCOPY WITH RETROGRADE PYELOGRAM/URETERAL STENT PLACEMENT;  Surgeon: Jannifer Hick, MD;  Location: [facility];  Service: Urology;  Laterality: Right;   CYSTOSCOPY WITH RETROGRADE PYELOGRAM, URETEROSCOPY AND STENT PLACEMENT Right 10/12/2022   Procedure: CYSTOSCOPY WITH RIGHT  RETROGRADE PYELOGRAM, RIGHT STENT EXCHANGE;  Surgeon: Jannifer Hick, MD;  Location: [facility];  Service: Urology;  Laterality: Right;   HAND SURGERY     VASECTOMY     VIDEO BRONCHOSCOPY WITH ENDOBRONCHIAL ULTRASOUND N/A 12/22/2018   Procedure: VIDEO BRONCHOSCOPY WITH ENDOBRONCHIAL ULTRASOUND;  Surgeon: Josephine Igo, DO;  Location: [facility];  Service: Thoracic;  Laterality: N/A;   VIDEO BRONCHOSCOPY WITH ENDOBRONCHIAL ULTRASOUND N/A 01/09/2019   Procedure: VIDEO BRONCHOSCOPY WITH ENDOBRONCHIAL ULTRASOUND WITH FLUORO;  Surgeon: Josephine Igo, DO;  Location: [facility];  Service: Thoracic;  Laterality: N/A;   VIDEO BRONCHOSCOPY WITH RADIAL ENDOBRONCHIAL ULTRASOUND N/A 12/22/2018   Procedure: RADIAL ENDOBRONCHIAL ULTRASOUND;  Surgeon: Josephine Igo, DO;  Location: [facility];  Service: Thoracic;  Laterality: N/A;   Social History:  reports that he quit smoking about  17 months ago. His smoking use included cigarettes. He started smoking about 52 years ago. He has a 102.8 pack-year smoking history. He has quit using smokeless tobacco.  His smokeless tobacco use included chew. He reports current alcohol use. He reports that he does not currently use drugs.  Family / Support Systems Marital Status: Married Patient Roles: Spouse, Parent Spouse/Significant Other: Judeth Cornfield [phone] Children: Three daughter's Other Supports: Friends Anticipated Caregiver: Wife Ability/Limitations of Caregiver: Good health and can provide physical assist Caregiver Availability: 24/7 Family Dynamics: Close knit family who will make sure pt has his needs met. Pt wants to be mod/i and take care of himself like he always has.  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 Return to Work Plans: Unsure if plans to return Marine scientist Issues: NA Guardian/Conservator: Wife is his HCPOA but MD feels he is capable of making his own decisions while here   Abuse/Neglect Abuse/Neglect Assessment Can Be Completed: Yes Physical Abuse: Denies Verbal Abuse: Denies Sexual Abuse: Denies Exploitation of patient/patient's resources: Denies Self-Neglect: Denies  Patient response to: Social Isolation - How often do you feel lonely or isolated from those around you?: Never  Emotional Status Pt's affect, behavior and adjustment status: Pt is motivated to do well and recover, he hopes to get  stronger and not need as much O2 when he leaves here. He has been dealing with his breathing issues for 3 years. Recent Psychosocial Issues: other health  issues-lung, etc Psychiatric History: No history/issues He is very forthcoming with his concerns and feelings. He is able to verbalize all of this. May benefit from seeing neuro-psych while here Substance Abuse History: NA quit tobacco 1.5 years now  Patient / Family Perceptions, Expectations & Goals Pt/Family understanding of illness & functional limitations: Pt talks with the MD's involved and is very involved in his care and choices. He tries to lead in the discussion and wants all on the same page. Premorbid pt/family roles/activities: Husband, father, retiree, home owner, etc Anticipated changes in roles/activities/participation: resume Pt/family expectations/goals: Pt states: " I want to be able to drive again and not need so much oxygen."  [employer]: None Premorbid Home Care/DME Agencies: Other (Comment) ([employer] for O2) Transportation available at discharge: wife Is the patient able to respond to transportation needs?: Yes In the past 12 months, has lack of transportation kept you from medical appointments or from getting medications?: No In the past 12 months, has lack of transportation kept you from meetings, work, or from getting things needed for daily living?: No Resource referrals recommended: Neuropsychology  Discharge Planning Living Arrangements: Spouse/significant other Support Systems: Spouse/significant other, Children, Other relatives, Friends/neighbors Type of Residence: Private residence Insurance Resources: Media planner (specify) Administrator Health) Financial Resources: Employment Financial Screen Referred: No Living Expenses: Own Money Management: Patient, Spouse Does the patient have any problems obtaining your medications?: No Home Management: wife Patient/Family Preliminary Plans: Return home with wife who is able to assist and does not work. Discussed team evaluating today and setting goals for stay here Care Coordinator  Barriers to Discharge: Insurance for SNF coverage Care Coordinator Anticipated Follow Up Needs: HH/OP  Clinical Impression Pleasant gentleman who is a take charge gentleman he is very involved in his care. His wife is supportive and can provide care if needed. Await therapy evaluations  Lucy Chris 05/06/2023, 11:39 AM

## 2023-05-06 NOTE — Progress Notes (Signed)
 PROGRESS NOTE   Subjective/Complaints:  PT working with therapy this AM.  O2 sats dropped with exertion but improved with rest.  Will change to 15/7 schedule.  Reports has not had a bowel movement in about 2-3 days.  ROS: Patient denies fever, new vision changes, dizziness, nausea, vomiting, diarrhea,  or chest pain, headache, or mood change.  + shortness of breath-gradually improving, worsened with exertion + constipation  Objective:   No results found. Recent Labs    05/06/23 0558  WBC 7.8  HGB 10.5*  HCT 32.9*  PLT 157   Recent Labs    05/06/23 0558  NA 139  K 4.3  CL 98  CO2 32  GLUCOSE 96  BUN 22  CREATININE 1.06  CALCIUM 8.3*    Intake/Output Summary (Last 24 hours) at 05/06/2023 1414 Last data filed at 05/06/2023 0753 Gross per 24 hour  Intake 994 ml  Output 2025 ml  Net -1031 ml        Physical Exam: Vital Signs Blood pressure 121/77, pulse (!) 112, temperature 98 F (36.7 C), resp. rate 20, height 5\' 7"  (1.702 m), weight 100.9 kg, SpO2 96%.   General: No apparent distress HEENT: Head is normocephalic, atraumatic Neck: Supple without JVD or lymphadenopathy Heart: Mild Tachycardia Chest: CTA bilaterally without wheezes, rales, or rhonchi; no distress, on 6L O2 Abdomen: Soft, non-tender, protuberant, bowel sounds positive. Extremities: No clubbing, cyanosis, or edema. Pulses are 2+ Psych: Pt's affect is appropriate. Pt is cooperative Skin: Bruising noted on bilateral upper extremities, right upper extremity IV looks okay Neuro: Alert and oriented x 4, no apparent cognitive deficits, cranial nerves II through XII grossly intact other than hard of hearing-chronic   MOTOR: RUE: 5/5 Deltoid, 5/5 Biceps, 5/5 Triceps,5/5 Grip LUE: 5/5 Deltoid, 5/5 Biceps, 5/5 Triceps, 5/5 Grip RLE: HF 5/5, KE 5/5, ADF 5/5, APF 5/5 LLE: HF 5/5, KE 5/5, ADF 5/5, APF 5/5     REFLEXES: No ankle clonus    SENSORY: Normal to touch all 4 extremities although altered in distal lower extremities length dependent distribution    Assessment/Plan: 1. Functional deficits which require 3+ hours per day of interdisciplinary therapy in a comprehensive inpatient rehab setting. Physiatrist is providing close team supervision and 24 hour management of active medical problems listed below. Physiatrist and rehab team continue to assess barriers to discharge/monitor patient progress toward functional and medical goals  Care Tool:  Bathing    Body parts bathed by patient: Right arm, Left arm, Chest, Abdomen, Front perineal area, Buttocks, Right upper leg, Left upper leg, Right lower leg, Left lower leg         Bathing assist Assist Level: Minimal Assistance - Patient > 75%     Upper Body Dressing/Undressing Upper body dressing   What is the patient wearing?: Pull over shirt    Upper body assist Assist Level: Set up assist    Lower Body Dressing/Undressing Lower body dressing      What is the patient wearing?: Underwear/pull up, Pants     Lower body assist Assist for lower body dressing: Maximal Assistance - Patient 25 - 49%     Toileting Toileting    Toileting  assist Assist for toileting: Minimal Assistance - Patient > 75%     Transfers Chair/bed transfer  Transfers assist     Chair/bed transfer assist level: Contact Guard/Touching assist     Locomotion Ambulation   Ambulation assist   Ambulation activity did not occur: Safety/medical concerns          Walk 10 feet activity   Assist  Walk 10 feet activity did not occur: Safety/medical concerns        Walk 50 feet activity   Assist Walk 50 feet with 2 turns activity did not occur: Safety/medical concerns         Walk 150 feet activity   Assist Walk 150 feet activity did not occur: Safety/medical concerns         Walk 10 feet on uneven surface  activity   Assist Walk 10 feet on uneven  surfaces activity did not occur: Safety/medical concerns         Wheelchair     Assist Is the patient using a wheelchair?: Yes Type of Wheelchair: Manual    Wheelchair assist level: Dependent - Patient 0%      Wheelchair 50 feet with 2 turns activity    Assist        Assist Level: Dependent - Patient 0%   Wheelchair 150 feet activity     Assist      Assist Level: Dependent - Patient 0%   Blood pressure 121/77, pulse (!) 112, temperature 98 F (36.7 C), resp. rate 20, height 5\' 7"  (1.702 m), weight 100.9 kg, SpO2 96%.  Medical Problem List and Plan: 1. Functional deficits secondary to acute respiratory distress/acute on chronic hypoxia/Pseudomonas pneumonia.  Prednisone taper.  Patient with chronic O2.             -Patient may shower             -ELOS/Goals: 10 to 12 days, PT OT mod I             -Continue CIR  -15/7 Schedule  2.  Antithrombotics: -DVT/anticoagulation:  Pharmaceutical: Xarelto for history of pulmonary emboli             -antiplatelet therapy: N/A 3. Pain Management: Oxycodone as needed             -Hx of chronic lower back pain, PDMP indicates norco 5 BID PRN  -4/3 back pain overall controlled continue current regimen 4. Mood/Behavior/Sleep: Provide emotional support             -antipsychotic agents: N/A 5. Neuropsych/cognition: This patient is capable of making decisions on his own behalf. 6. Skin/Wound Care: Routine skin checks 7. Fluids/Electrolytes/Nutrition: Routine in and outs with follow-up chemistries 8.  History of recurrent non-small cell lung cancer.  Previously received chemotherapy and immunotherapy in 2020 and 2022.  Followed outpatient by pulmonary team as well as oncology services Dr. Si Gaul 9.  History of radiation-induced IPF.  Continue bronchodilators and steroid as directed 10.  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  Followed outpatient by urology services Dr. Cardell Peach.  Continue Flomax              -Reports dark urine at baseline 11.  Tingling bilateral feet/neuropathy-chronic             -Potentially paraneoplastic, continue to monitor  12.  Leukocytosis  -4/3 WBC within normal limits today 13. Constipation   -4/3 LBM 4/1, patient to try suppository today, continue MiraLAX and Senokot.  Add sorbitol as needed    LOS: 1 days A FACE TO FACE EVALUATION WAS PERFORMED  Fanny Dance 05/06/2023, 2:14 PM

## 2023-05-06 NOTE — Plan of Care (Signed)
  Problem: RH Balance Goal: LTG Patient will maintain dynamic standing balance (PT) Description: LTG:  Patient will maintain dynamic standing balance with assistance during mobility activities (PT) Flowsheets (Taken 05/06/2023 1636) LTG: Pt will maintain dynamic standing balance during mobility activities with:: Independent with assistive device    Problem: Sit to Stand Goal: LTG:  Patient will perform sit to stand with assistance level (PT) Description: LTG:  Patient will perform sit to stand with assistance level (PT) Flowsheets (Taken 05/06/2023 1636) LTG: PT will perform sit to stand in preparation for functional mobility with assistance level: Independent with assistive device   Problem: RH Bed Mobility Goal: LTG Patient will perform bed mobility with assist (PT) Description: LTG: Patient will perform bed mobility with assistance, with/without cues (PT). Flowsheets (Taken 05/06/2023 1636) LTG: Pt will perform bed mobility with assistance level of: Independent with assistive device    Problem: RH Bed to Chair Transfers Goal: LTG Patient will perform bed/chair transfers w/assist (PT) Description: LTG: Patient will perform bed to chair transfers with assistance (PT). Flowsheets (Taken 05/06/2023 1636) LTG: Pt will perform Bed to Chair Transfers with assistance level: Independent with assistive device    Problem: RH Car Transfers Goal: LTG Patient will perform car transfers with assist (PT) Description: LTG: Patient will perform car transfers with assistance (PT). Flowsheets (Taken 05/06/2023 1636) LTG: Pt will perform car transfers with assist:: Independent with assistive device    Problem: RH Ambulation Goal: LTG Patient will ambulate in controlled environment (PT) Description: LTG: Patient will ambulate in a controlled environment, # of feet with assistance (PT). Flowsheets (Taken 05/06/2023 1636) LTG: Pt will ambulate in controlled environ  assist needed:: Independent with assistive  device LTG: Ambulation distance in controlled environment: 50 feet with LRAD Goal: LTG Patient will ambulate in home environment (PT) Description: LTG: Patient will ambulate in home environment, # of feet with assistance (PT). Flowsheets (Taken 05/06/2023 1636) LTG: Pt will ambulate in home environ  assist needed:: Supervision/Verbal cueing LTG: Ambulation distance in home environment: 25 feet with LRAD   Problem: RH Stairs Goal: LTG Patient will ambulate up and down stairs w/assist (PT) Description: LTG: Patient will ambulate up and down # of stairs with assistance (PT) Flowsheets (Taken 05/06/2023 1636) LTG: Pt will ambulate up/down stairs assist needed:: Supervision/Verbal cueing LTG: Pt will  ambulate up and down number of stairs: 2 steps with LRAD for home entry

## 2023-05-06 NOTE — Progress Notes (Signed)
 PROGRESS NOTE   Subjective/Complaints:  PT working with therapy this AM.  O2 sats dropped with exertion but improved with rest.  Will change to 15/7 schedule.  Reports has not had a bowel movement in about 2-3 days.  ROS: Patient denies fever, new vision changes, dizziness, nausea, vomiting, diarrhea,  or chest pain, headache, or mood change.  + shortness of breath-gradually improving, worsened with exertion + constipation  Objective:   No results found. Recent Labs    05/06/23 0558  WBC 7.8  HGB 10.5*  HCT 32.9*  PLT 157   Recent Labs    05/06/23 0558  NA 139  K 4.3  CL 98  CO2 32  GLUCOSE 96  BUN 22  CREATININE 1.06  CALCIUM 8.3*    Intake/Output Summary (Last 24 hours) at 05/06/2023 1501 Last data filed at 05/06/2023 0753 Gross per 24 hour  Intake 994 ml  Output 1525 ml  Net -531 ml        Physical Exam: Vital Signs Blood pressure 121/77, pulse (!) 112, temperature 98 F (36.7 C), resp. rate 20, height 5\' 7"  (1.702 m), weight 100.9 kg, SpO2 96%.   General: No apparent distress HEENT: Head is normocephalic, atraumatic Neck: Supple without JVD or lymphadenopathy Heart: Mild Tachycardia Chest: CTA bilaterally without wheezes, rales, or rhonchi; no distress, on 6L O2 Abdomen: Soft, non-tender, protuberant, bowel sounds positive. Extremities: No clubbing, cyanosis, or edema. Pulses are 2+ Psych: Pt's affect is appropriate. Pt is cooperative Skin: Bruising noted on bilateral upper extremities, right upper extremity IV looks okay Neuro: Alert and oriented x 4, no apparent cognitive deficits, cranial nerves II through XII grossly intact other than hard of hearing-chronic   MOTOR: RUE: 5/5 Deltoid, 5/5 Biceps, 5/5 Triceps,5/5 Grip LUE: 5/5 Deltoid, 5/5 Biceps, 5/5 Triceps, 5/5 Grip RLE: HF 5/5, KE 5/5, ADF 5/5, APF 5/5 LLE: HF 5/5, KE 5/5, ADF 5/5, APF 5/5     REFLEXES: No ankle clonus    SENSORY: Normal to touch all 4 extremities although altered in distal lower extremities length dependent distribution    Assessment/Plan: 1. Functional deficits which require 3+ hours per day of interdisciplinary therapy in a comprehensive inpatient rehab setting. Physiatrist is providing close team supervision and 24 hour management of active medical problems listed below. Physiatrist and rehab team continue to assess barriers to discharge/monitor patient progress toward functional and medical goals  Care Tool:  Bathing    Body parts bathed by patient: Right arm, Left arm, Chest, Abdomen, Front perineal area, Buttocks, Right upper leg, Left upper leg, Right lower leg, Left lower leg         Bathing assist Assist Level: Minimal Assistance - Patient > 75%     Upper Body Dressing/Undressing Upper body dressing   What is the patient wearing?: Pull over shirt    Upper body assist Assist Level: Set up assist    Lower Body Dressing/Undressing Lower body dressing      What is the patient wearing?: Underwear/pull up, Pants     Lower body assist Assist for lower body dressing: Maximal Assistance - Patient 25 - 49%     Toileting Toileting    Toileting  assist Assist for toileting: Minimal Assistance - Patient > 75%     Transfers Chair/bed transfer  Transfers assist     Chair/bed transfer assist level: Contact Guard/Touching assist     Locomotion Ambulation   Ambulation assist   Ambulation activity did not occur: Safety/medical concerns          Walk 10 feet activity   Assist  Walk 10 feet activity did not occur: Safety/medical concerns        Walk 50 feet activity   Assist Walk 50 feet with 2 turns activity did not occur: Safety/medical concerns         Walk 150 feet activity   Assist Walk 150 feet activity did not occur: Safety/medical concerns         Walk 10 feet on uneven surface  activity   Assist Walk 10 feet on uneven  surfaces activity did not occur: Safety/medical concerns         Wheelchair     Assist Is the patient using a wheelchair?: Yes Type of Wheelchair: Manual    Wheelchair assist level: Dependent - Patient 0%      Wheelchair 50 feet with 2 turns activity    Assist        Assist Level: Dependent - Patient 0%   Wheelchair 150 feet activity     Assist      Assist Level: Dependent - Patient 0%   Blood pressure 121/77, pulse (!) 112, temperature 98 F (36.7 C), resp. rate 20, height 5\' 7"  (1.702 m), weight 100.9 kg, SpO2 96%.  Medical Problem List and Plan: 1. Functional deficits secondary to acute respiratory distress/acute on chronic hypoxia/Pseudomonas pneumonia.  Prednisone taper.  Patient with chronic O2.             -Patient may shower             -ELOS/Goals: 10 to 12 days, PT OT mod I             -Continue CIR  -15/7 Schedule   -Start lasix 20mg  2.  Antithrombotics: -DVT/anticoagulation:  Pharmaceutical: Xarelto for history of pulmonary emboli             -antiplatelet therapy: N/A 3. Pain Management: Oxycodone as needed             -Hx of chronic lower back pain, PDMP indicates norco 5 BID PRN  -4/3 back pain overall controlled continue current regimen 4. Mood/Behavior/Sleep: Provide emotional support             -antipsychotic agents: N/A 5. Neuropsych/cognition: This patient is capable of making decisions on his own behalf. 6. Skin/Wound Care: Routine skin checks 7. Fluids/Electrolytes/Nutrition: Routine in and outs with follow-up chemistries 8.  History of recurrent non-small cell lung cancer.  Previously received chemotherapy and immunotherapy in 2020 and 2022.  Followed outpatient by pulmonary team as well as oncology services Dr. Si Gaul 9.  History of radiation-induced IPF.  Continue bronchodilators and steroid as directed 10.  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  Followed outpatient by urology services Dr. Cardell Peach.   Continue Flomax             -Reports dark urine at baseline 11.  Tingling bilateral feet/neuropathy-chronic             -Potentially paraneoplastic, continue to monitor  12.  Leukocytosis  -4/3 WBC within normal limits today 13. Constipation   -4/3 LBM 4/1, patient to try suppository today, continue  MiraLAX and Senokot.  Add sorbitol as needed   Filed Weights   05/05/23 1344  Weight: 100.9 kg    LOS: 1 days A FACE TO FACE EVALUATION WAS PERFORMED  Fanny Dance 05/06/2023, 3:01 PM

## 2023-05-06 NOTE — Progress Notes (Signed)
 [facility] Individual Statement of Services  Patient Name:  Philip [name]  Date:  05/06/2023  Welcome to the [facility].  Our goal is to provide you with an individualized program based on your diagnosis and situation, designed to meet your specific needs.  With this comprehensive rehabilitation program, you will be expected to participate in at least 3 hours of rehabilitation therapies Monday-Friday, with modified therapy programming on the weekends.  Your rehabilitation program will include the following services:  Physical Therapy (PT), Occupational Therapy (OT), 24 hour per day rehabilitation nursing, Therapeutic Recreaction (TR), Neuropsychology, Care Coordinator, Rehabilitation Medicine, Nutrition Services, and Pharmacy Services  Weekly team conferences will be held on Wednesday to discuss your progress.  Your Inpatient Rehabilitation Care Coordinator will talk with you frequently to get your input and to update you on team discussions.  Team conferences with you and your family in attendance may also be held.  Expected length of stay: 8-10 Days  Overall anticipated outcome: Independent with device  Depending on your progress and recovery, your program may change. Your Inpatient Rehabilitation Care Coordinator will coordinate services and will keep you informed of any changes. Your Inpatient Rehabilitation Care Coordinator's name and contact numbers are listed  below.  The following services may also be recommended but are not provided by the Inpatient Rehabilitation [facility]:   Home Health Rehabiltiation Services Outpatient Rehabilitation Services Vocational Rehabilitation   Arrangements will be made to provide these services after discharge if needed.  Arrangements include referral to agencies that provide these services.  Your insurance has been verified to be:  [employer] Your primary doctor is:  Farris Has  Pertinent  information will be shared with your doctor and your insurance company.  Inpatient Rehabilitation Care Coordinator:  Dossie Der, [place] [phone] or Luna [place]  Information discussed with and copy given to patient by: Lucy Chris, 05/06/2023, 11:41 AM

## 2023-05-06 NOTE — Progress Notes (Signed)
 Inpatient Rehabilitation  Patient information reviewed and entered into eRehab system by Feliberto Gottron, M.A., CCC-SLP, Rehab Quality Coordinator.  Information including medical coding, functional ability and quality indicators will be reviewed and updated through discharge.

## 2023-05-07 DIAGNOSIS — J9600 Acute respiratory failure, unspecified whether with hypoxia or hypercapnia: Secondary | ICD-10-CM | POA: Diagnosis not present

## 2023-05-07 DIAGNOSIS — K5900 Constipation, unspecified: Secondary | ICD-10-CM | POA: Diagnosis not present

## 2023-05-07 DIAGNOSIS — M545 Low back pain, unspecified: Secondary | ICD-10-CM | POA: Diagnosis not present

## 2023-05-07 DIAGNOSIS — N400 Enlarged prostate without lower urinary tract symptoms: Secondary | ICD-10-CM | POA: Diagnosis not present

## 2023-05-07 NOTE — Progress Notes (Signed)
 PROGRESS NOTE   Subjective/Complaints:  No new concerns this morning.  Reports he is feeling better each day.  Continues to be very limited by his pulmonary function.  ROS: Patient denies fever, new vision changes, dizziness, nausea, vomiting, diarrhea,  or chest pain, headache, or mood change.  + shortness of breath-gradually improving, worsened with exertion + constipation- improved LBM today  Objective:   No results found. Recent Labs    05/06/23 0558  WBC 7.8  HGB 10.5*  HCT 32.9*  PLT 157   Recent Labs    05/06/23 0558  NA 139  K 4.3  CL 98  CO2 32  GLUCOSE 96  BUN 22  CREATININE 1.06  CALCIUM 8.3*    Intake/Output Summary (Last 24 hours) at 05/07/2023 1724 Last data filed at 05/07/2023 1419 Gross per 24 hour  Intake 657 ml  Output 2350 ml  Net -1693 ml        Physical Exam: Vital Signs Blood pressure 95/65, pulse (!) 107, temperature 97.7 F (36.5 C), resp. rate 19, height 5\' 7"  (1.702 m), weight 101.2 kg, SpO2 95%.   General: No apparent distress, working with therapy this AM HEENT: Head is normocephalic, atraumatic Neck: Supple without JVD or lymphadenopathy Heart: Mild Tachycardia Chest: CTA bilaterally without wheezes, rales, or rhonchi; no distress, on 7L O2 Abdomen: Soft, non-tender, protuberant, bowel sounds positive. Extremities: No clubbing, cyanosis, or edema. Pulses are 2+ Psych: Pt's affect is appropriate. Pt is cooperative Skin: Bruising noted on bilateral upper extremities, right upper extremity IV looks okay Neuro: Alert and oriented x 4, no apparent cognitive deficits, cranial nerves II through XII grossly intact other than hard of hearing-chronic   MOTOR: RUE: 5/5 Deltoid, 5/5 Biceps, 5/5 Triceps,5/5 Grip LUE: 5/5 Deltoid, 5/5 Biceps, 5/5 Triceps, 5/5 Grip RLE: HF 5/5, KE 5/5, ADF 5/5, APF 5/5 LLE: HF 5/5, KE 5/5, ADF 5/5, APF 5/5     SENSORY: Normal to touch all 4  extremities although altered in distal lower extremities length dependent distribution    Assessment/Plan: 1. Functional deficits which require 3+ hours per day of interdisciplinary therapy in a comprehensive inpatient rehab setting. Physiatrist is providing close team supervision and 24 hour management of active medical problems listed below. Physiatrist and rehab team continue to assess barriers to discharge/monitor patient progress toward functional and medical goals  Care Tool:  Bathing    Body parts bathed by patient: Right arm, Left arm, Chest, Abdomen, Front perineal area, Buttocks, Right upper leg, Left upper leg, Right lower leg, Left lower leg         Bathing assist Assist Level: Minimal Assistance - Patient > 75%     Upper Body Dressing/Undressing Upper body dressing   What is the patient wearing?: Pull over shirt    Upper body assist Assist Level: Set up assist    Lower Body Dressing/Undressing Lower body dressing      What is the patient wearing?: Underwear/pull up, Pants     Lower body assist Assist for lower body dressing: Maximal Assistance - Patient 25 - 49%     Toileting Toileting    Toileting assist Assist for toileting: Minimal Assistance - Patient >  75%     Transfers Chair/bed transfer  Transfers assist     Chair/bed transfer assist level: Contact Guard/Touching assist     Locomotion Ambulation   Ambulation assist   Ambulation activity did not occur: Safety/medical concerns          Walk 10 feet activity   Assist  Walk 10 feet activity did not occur: Safety/medical concerns        Walk 50 feet activity   Assist Walk 50 feet with 2 turns activity did not occur: Safety/medical concerns         Walk 150 feet activity   Assist Walk 150 feet activity did not occur: Safety/medical concerns         Walk 10 feet on uneven surface  activity   Assist Walk 10 feet on uneven surfaces activity did not occur:  Safety/medical concerns         Wheelchair     Assist Is the patient using a wheelchair?: Yes Type of Wheelchair: Manual    Wheelchair assist level: Dependent - Patient 0%      Wheelchair 50 feet with 2 turns activity    Assist        Assist Level: Dependent - Patient 0%   Wheelchair 150 feet activity     Assist      Assist Level: Dependent - Patient 0%   Blood pressure 95/65, pulse (!) 107, temperature 97.7 F (36.5 C), resp. rate 19, height 5\' 7"  (1.702 m), weight 101.2 kg, SpO2 95%.  Medical Problem List and Plan: 1. Functional deficits secondary to acute respiratory distress/acute on chronic hypoxia/Pseudomonas pneumonia.  Prednisone taper.  Patient with chronic O2.             -Patient may shower             -ELOS/Goals: 10 to 12 days, PT OT mod I             -Continue CIR  -15/7 Schedule   -Start lasix 20mg   -BMP labs tomorrow 2.  Antithrombotics: -DVT/anticoagulation:  Pharmaceutical: Xarelto for history of pulmonary emboli             -antiplatelet therapy: N/A 3. Pain Management: Oxycodone as needed             -Hx of chronic lower back pain, PDMP indicates norco 5 BID PRN  -4/4 reports back pain controlled, continue current pain regimen 4. Mood/Behavior/Sleep: Provide emotional support             -antipsychotic agents: N/A 5. Neuropsych/cognition: This patient is capable of making decisions on his own behalf. 6. Skin/Wound Care: Routine skin checks 7. Fluids/Electrolytes/Nutrition: Routine in and outs with follow-up chemistries 8.  History of recurrent non-small cell lung cancer.  Previously received chemotherapy and immunotherapy in 2020 and 2022.  Followed outpatient by pulmonary team as well as oncology services Dr. Si Gaul 9.  History of radiation-induced IPF.  Continue bronchodilators and steroid as directed 10.  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  Followed outpatient by urology services Dr. Cardell Peach.  Continue  Flomax             -Reports dark urine at baseline  - Remains continent 11.  Tingling bilateral feet/neuropathy-chronic             -Potentially paraneoplastic, continue to monitor  12.  Leukocytosis  -4/3 WBC within normal limits today 13. Constipation   -4/3 LBM 4/1, patient to try suppository today, continue  MiraLAX and Senokot.  Add sorbitol as needed  -4/4 LBM today, large continue to monitor   Filed Weights   05/05/23 1344 05/07/23 0404  Weight: 100.9 kg 101.2 kg    LOS: 2 days A FACE TO FACE EVALUATION WAS PERFORMED  Fanny Dance 05/07/2023, 5:24 PM

## 2023-05-07 NOTE — Progress Notes (Signed)
 Physical Therapy Session Note  Patient Details  Name: Philip [name] MRN: [id number] Date of Birth: 1955-12-21  Today's Date: 05/07/2023 PT Individual Time: 0805-0930 PT Individual Time Calculation (min): 85 min   Short Term Goals: Week 1:  PT Short Term Goal 1 (Week 1): pt will tolerate sitting OOB between sessions PT Short Term Goal 2 (Week 1): pt will ambulate 25 feet with LRAD with continuous O2 montioring  Skilled Therapeutic Interventions/Progress Updates:  Patient supine in bed with elevated HOB on entrance to room. Patient alert and agreeable to PT session. On 6L of O2 throughout session.   Patient with no pain complaint at start of session.  At rest, SpO2 at 91% and pulse at 118. MD enters for morning rounding and asking re: pt's constipation. Pt willing to request suppository if he does not move bowels this day.   Therapeutic Activity: Pt relates recent medical history and PLOF. Talkative throughout session which affects SpO2 negatively as pt not breathing in through nose for supplemental O2. Despite vc for maintaining frequent intake of supplemental O2, pt continues to talk throughout session. Requires extensive rest breaks to bring SpO2 above 84%. Bed Mobility: Sitting in bed and initiating conversation, pt's SpO2 drops to 82%. Increases with focus to inhalation through nose. Pt performed supine <> sit with supervision and O2 drops to 74% while seated on EOB. Donned TED hose with ModA. With vc for PLB, O2 increases to 86% within several minutes.  Transfers: Pt performed sit<>stand and stand pivot transfers throughout session with supervision/ CGA. Provided vc/ tc for hip hinge instead of trunk flexion to decrease intrabdominal and intrathroacic pressures.   Gait Training:  Pt willing to ambulate short distances in room. Portable O2 tank and extra length O2 tubing retrieved. Pt then able to ambulate total of 18 ft using RW with overall close supervision/ CGA One standing rest  break at 14 feet. Sits to w/c and takes time and focus on PLB to bring O2 up to 86%. Completes distance back to bed ~8 ft.   Following seated rest, pt stands and completes minisquats x5. And after another seated rest break, is able to complete standing heel raises x5. After heel raises, O2 drops to 69% and again requires extra time and vc for focus to PLB to reach 80% for extended time. Pt then relates sensation for toileting and requests BSC. Transfers to [facility] with supervision and no AD. Pt with gas initially but with time is able to move bowels. SpO2 remains at 80%  while seated on BSC and pt agreeable to increasing O2 to 7L. With time and PLB. Is able to reach 88 then 90%. Pulse rate at 113.   Patient seated on BSC at end of session with brakes locked, no alarm set, and all needs with call bell within reach. NT notified as to pt's disposition and need for supervision.    Therapy Documentation Precautions:  Precautions Precautions: Fall, Other (comment) Recall of Precautions/Restrictions: Intact Precaution/Restrictions Comments: Monitor O2. Goal: 88% or greater on 6L Restrictions Weight Bearing Restrictions Per Provider Order: No  Vital Signs: Patient Position (if appropriate): Sitting Oxygen Therapy SpO2: 96 % O2 Device: Nasal Cannula O2 Flow Rate (L/min): 8 L/min Pain:  No pain related this session.   Therapy/Group: Individual Therapy  Loel Dubonnet PT, DPT, CSRS 05/07/2023, 12:42 PM

## 2023-05-07 NOTE — Progress Notes (Signed)
 Occupational Therapy Session Note  Patient Details  Name: ESMERALDA BLANFORD MRN: [id number] Date of Birth: 1955/05/13  Today's Date: 05/07/2023 OT Individual Time: 0945-1100 OT Individual Time Calculation (min): 75 min    Short Term Goals: Week 1:  OT Short Term Goal 1 (Week 1): Pt will complete bathing at shower level while seated utilizing compensatory techniques while monitoring SpO2 and exertion level. OT Short Term Goal 2 (Week 1): Pt will completed dressing while seated with SBA while utilizing energy conservation techniques.  Skilled Therapeutic Interventions/Progress Updates:    1:1 Pt received sitting EOB. Pt with elevated HR in the 140s and O2 in the 80s but reports being okay. Pt ambulated very slowly to the bathroom with multiple standing rest breaks. Pt showered sit to stand with more than reasonable amt of time to maintain O2 at 88% with multiple rest breaks. Pt allowed to be bumped up to 10 liters of O2. Discussed with nursing need green high flow tubing and high flow setup. Pt able to recoup faster with more O2 but still needed frequent rest breaks. A for washing buttocks and LEs. Pt transferred bench in shower to w/c. At w/c level pt able to dress sit to stand with supervision.   Lowest pt desated was 79 in session but with activity needed higher level of O2.  Transferred to the recliner and continued to work on endurance and overall strengthening as pt requested. With 3.3 lb ball performed diagonals 15 x in both directions, sit to stands holding the Tidal wave 10 x with extended rest break inbetween sit to stands.  Pt left resting on 7 liters of O2 in the recliner with LEs elevated.    Therapy Documentation Precautions:  Precautions Precautions: Fall, Other (comment) Recall of Precautions/Restrictions: Intact Precaution/Restrictions Comments: Monitor O2. Goal: 88% or greater on 6L Restrictions Weight Bearing Restrictions Per Provider Order: No    Therapy/Group:  Individual Therapy  Roney Mans [facility] 05/07/2023, 3:00 PM

## 2023-05-07 NOTE — Progress Notes (Addendum)
 Occupational Therapy Session Note  Patient Details  Name: Philip [name] MRN: [id number] Date of Birth: Jun 17, 1955  Today's Date: 05/08/2023 OT Individual Time: 1020-1100 OT Individual Time Calculation (min): 40 min   Today's Date: 05/08/2023 OT Individual Time: 8657-8469 OT Individual Time Calculation (min): 40 min   Short Term Goals: Week 1:  OT Short Term Goal 1 (Week 1): Pt will complete bathing at shower level while seated utilizing compensatory techniques while monitoring SpO2 and exertion level. OT Short Term Goal 2 (Week 1): Pt will completed dressing while seated with SBA while utilizing energy conservation techniques.  Skilled Therapeutic Interventions/Progress Updates:   Session 1: Pt received resting in recliner, on 6L HFNC, un-rated pain in kidneys. Pt choosing to perform room-level therapy. Pt uses urinal with setup, desat to 83 spo2, bumped to 8L and still unable to recover to orders of >88%, therefore patient placed on 10L. Pt then performs series of sit<>stand transfers, close supervision + no AD, for dynamic standing balance activity on Airex. Pt completes x2 rounds of ~1-1.5 min of stance on Airex to target balance strategies required during ADL participation and functional mobility/transfers. Pt with mild lateral LOB when attempting to close eyes, otherwise CGA-close supervision. Desats to low 80s managed with extended seated rest-breaks and cuing for pursued-lipped breathing. Pt also utilizes acapella valve intermediately to manage SOB. Pt remained sitting in recliner, all needs within reach, chair pad activated.   Session 2: Pt received sitting in recliner, on 8L HFNC, reporting preference for increase during meal consumption. Pt eager to participate in conditioning exercises.   Pt instructed in series of exercises noted below for cardiovascular endurance and strength required during ADL participation. Bumped to 10L HFNC, desats to mid-high 80s managed with extended  rest-breaks.   Overhead press into tricep extension  Chest press Bicep curls Torso twist with diagonal reaches Knee extensions (weighted)  LAQ Leg raise over medicine ball  Pt completes 8-10 reps for UE exercises and 5 reps of LB/core exercises utilizing 3# medicine ball. Multimodal cuing for form. Improvement in recovery time when compared to AM session. Pt remained seated in recliner, chair pad activated, all needs within reach. Returned to 6L HFNC with stats at 91-92%.   Therapy Documentation Precautions:  Precautions Precautions: Fall, Other (comment) Recall of Precautions/Restrictions: Intact Precaution/Restrictions Comments: Monitor O2. Goal: 88% or greater on 6L Restrictions Weight Bearing Restrictions Per Provider Order: No   Therapy/Group: Individual Therapy  Lou Cal, OTR/L, MSOT  05/08/2023, 10:32 AM

## 2023-05-08 DIAGNOSIS — N400 Enlarged prostate without lower urinary tract symptoms: Secondary | ICD-10-CM | POA: Diagnosis not present

## 2023-05-08 DIAGNOSIS — M545 Low back pain, unspecified: Secondary | ICD-10-CM | POA: Diagnosis not present

## 2023-05-08 DIAGNOSIS — J9600 Acute respiratory failure, unspecified whether with hypoxia or hypercapnia: Secondary | ICD-10-CM | POA: Diagnosis not present

## 2023-05-08 DIAGNOSIS — K5900 Constipation, unspecified: Secondary | ICD-10-CM | POA: Diagnosis not present

## 2023-05-08 LAB — BASIC METABOLIC PANEL WITH GFR
Anion gap: 12 (ref 5–15)
BUN: 21 mg/dL (ref 8–23)
CO2: 33 mmol/L — ABNORMAL HIGH (ref 22–32)
Calcium: 9.1 mg/dL (ref 8.9–10.3)
Chloride: 96 mmol/L — ABNORMAL LOW (ref 98–111)
Creatinine, Ser: 1.09 mg/dL (ref 0.61–1.24)
GFR, Estimated: >60 mL/min (ref >60)
Glucose, Bld: 116 mg/dL — ABNORMAL HIGH (ref 70–99)
Potassium: 4.2 mmol/L (ref 3.5–5.1)
Sodium: 141 mmol/L (ref 135–145)

## 2023-05-08 NOTE — IPOC Note (Signed)
 Overall Plan of Care [facility]) Patient Details Name: Philip [name] MRN: [id number] DOB: February 25, 1955  Admitting Diagnosis: Acute respiratory failure [facility])  Hospital Problems: Principal Problem:   Acute respiratory failure (HCC)     Functional Problem List: Nursing Bladder, Bowel, Endurance, Medication Management, Pain, Safety, Perception  PT Balance, Endurance, Edema, Safety, Sensory  OT Other (Comment) (activity tolerance)  SLP    TR         Basic ADL's: OT Grooming, Bathing, Dressing, Toileting     Advanced  ADL's: OT       Transfers: PT Bed Mobility, Bed to Chair, Customer service manager, Tub/Shower     Locomotion: PT Ambulation, Psychologist, prison and probation services, Stairs     Additional Impairments: OT    SLP        TR      Anticipated Outcomes Item Anticipated Outcome  Self Feeding    Swallowing      Basic self-care  Mod I  Toileting  Mod I   Bathroom Transfers Mod I  Bowel/Bladder  manage bladder with medications/ manage bowels with medications  Transfers  mod I  Locomotion  sup  Communication     Cognition     Pain  <4 w/ prns  Safety/Judgment  manage safety with mod I assistance   Therapy Plan: PT Intensity: Minimum of 1-2 x/day ,45 to 90 minutes PT Frequency: 5 out of 7 days PT Duration Estimated Length of Stay: 10-14 days OT Intensity: Minimum of 1-2 x/day, 45 to 90 minutes OT Frequency: 5 out of 7 days OT Duration/Estimated Length of Stay: 10-14 days     Team Interventions: Nursing Interventions Patient/Family Education, Medication Management, Bladder Management, Bowel Management, Disease Management/Prevention, Pain Management, Discharge Planning  PT interventions Ambulation/gait training, Community reintegration, DME/adaptive equipment instruction, Neuromuscular re-education, Psychosocial support, Stair training, UE/LE Strength taining/ROM, Wheelchair propulsion/positioning, Warden/ranger, Discharge planning, Pain management, Skin care/wound  management, Therapeutic Activities, UE/LE Coordination activities, Cognitive remediation/compensation, Disease management/prevention, Functional mobility training, Patient/family education, Splinting/orthotics, Therapeutic Exercise, Visual/perceptual remediation/compensation  OT Interventions Disease mangement/prevention, Self Care/advanced ADL retraining, Therapeutic Exercise, UE/LE Strength taining/ROM, DME/adaptive equipment instruction, Patient/family education, Discharge planning, Functional mobility training, Therapeutic Activities  SLP Interventions    TR Interventions    SW/CM Interventions Discharge Planning, Psychosocial Support, Patient/Family Education   Barriers to Discharge MD  Medical stability  Nursing Decreased caregiver support, Home environment access/layout Discharge :House  Home Access: Stairs to enter  Secretary/administrator of Steps: 2  Entrance Stairs-Rails: None  Home Layout: One level  PT (!) Pending surgery, Inaccessible home environment, Home environment access/layout, Weight    OT      SLP      SW Insurance for SNF coverage     Team Discharge Planning: Destination: PT-Home ,OT- Home , SLP-  Projected Follow-up: PT-Home health PT, OT-  Home health OT, SLP-  Projected Equipment Needs: PT-To be determined, OT- To be determined, Tub/shower bench, SLP-  Equipment Details: PT- , OT-Owns: Manual WC, walking stick, shower chair, grab bars - tub/shower Patient/family involved in discharge planning: PT- Patient,  OT-Patient, SLP-   MD ELOS: 10-14 Medical Rehab Prognosis:  Good Assessment: The patient has been admitted for CIR therapies with the diagnosis of acute respiratory distress/acute on chronic hypoxia/Pseudomonas pneumonia . The team will be addressing functional mobility, strength, stamina, balance, safety, adaptive techniques and equipment, self-care, bowel and bladder mgt, patient and caregiver education. Goals have been set at Mod I. Anticipated discharge  destination is home.  See Team Conference Notes for weekly updates to the plan of care

## 2023-05-08 NOTE — Progress Notes (Signed)
 Physical Therapy Session Note  Patient Details  Name: Philip [name] MRN: [id number] Date of Birth: 1955/04/21  Today's Date: 05/08/2023 PT Individual Time: 0801-0900 PT Individual Time Calculation (min): 59 min   Short Term Goals: Week 1:  PT Short Term Goal 1 (Week 1): pt will tolerate sitting OOB between sessions PT Short Term Goal 2 (Week 1): pt will ambulate 25 feet with LRAD with continuous O2 montioring  Skilled Therapeutic Interventions/Progress Updates:  Patient supine in bed on entrance to room. Patient alert and agreeable to PT session.   Patient with no pain complaint at start of session.  At rest, pt with O2 at 10L and resting at 99-100% with pulse at 112. Pt's O2 lowered to 8L with some reduction in SpO2, decreased to 7L with SpO2 resting at 92-94% - pulse maintained at 112- 113.   Therapeutic Activity: Bed Mobility: Pt performed supine > sit with supervision. VC/ tc required for effort and maintaining breathing throughout. SpO2 decreases to 84% and pulse increases to 120.  Transfers: Pt performed sit<>stand and stand pivot transfers throughout session with close supervision to no AD and good technique. No need for cues in technique but cued to maintain exhale with effort into standing.   Gait Training:  Pt willing to attempt ambulation this session. He ambulated 62' x1/ 18' x1/ 18' x1/ 15' x1 using O2 tank for AD with close w/c follow and close supervision. Pt on 8L O2. Extensive seated rest breaks. Pt stops each amb bout d/t SOB. At worst decreases to 78%. Requires at least 2 min to improve to 86-88% and more time to increase to 90%.  Demonstrated delay in reading on monitor compared to pt's feeling of fatigue. Overall relates feeling good.   During session, pt has lab tech in to draw blood and respiratory tech in to provide 3 breathing treatments. Pt guided in breaths, focus on inhaling breathing treatments with decreased talking,  LAQs x10 with rest breaks between sets.  Mild decrease in SpO2. After breathing treatments, pt's O2 reduced to 7L and left as next therapy session is in one hour.   Patient seated upright in recliner with BLE elevated at end of session with brakes locked, no alarm set, and all needs within reach. NT notified as to pt's disposition and need for alarm belt.    Therapy Documentation Precautions:  Precautions Precautions: Fall, Other (comment) Recall of Precautions/Restrictions: Intact Precaution/Restrictions Comments: Monitor O2. Goal: 88% or greater on 6L Restrictions Weight Bearing Restrictions Per Provider Order: No  Pain: No pain related this session.   Therapy/Group: Individual Therapy  Loel Dubonnet PT, DPT, CSRS 05/08/2023, 3:40 PM

## 2023-05-08 NOTE — Progress Notes (Signed)
 PROGRESS NOTE   Subjective/Complaints:  NO new concerns this AM. He reports he is feeling better each day. Reports regular Bms.   ROS: Patient denies fever, chills, new vision changes, dizziness, nausea, abd pain, vomiting, diarrhea,  or chest pain, headache, or mood change.  + shortness of breath-gradually improving, worsened with exertion + constipation- improved  Objective:   No results found. Recent Labs    05/06/23 0558  WBC 7.8  HGB 10.5*  HCT 32.9*  PLT 157   Recent Labs    05/06/23 0558 05/08/23 0847  NA 139 141  K 4.3 4.2  CL 98 96*  CO2 32 33*  GLUCOSE 96 116*  BUN 22 21  CREATININE 1.06 1.09  CALCIUM 8.3* 9.1    Intake/Output Summary (Last 24 hours) at 05/08/2023 1133 Last data filed at 05/08/2023 0810 Gross per 24 hour  Intake 586 ml  Output 1100 ml  Net -514 ml        Physical Exam: Vital Signs Blood pressure 103/82, pulse (!) 113, temperature 98.5 F (36.9 C), temperature source Oral, resp. rate 20, height 5\' 7"  (1.702 m), weight 100.2 kg, SpO2 95%.   General: No apparent distress, sitting in chair HEENT: Head is normocephalic, atraumatic Neck: Supple without JVD or lymphadenopathy Heart: Mild Tachycardia Chest: CTA bilaterally without wheezes, rales, or rhonchi; no distress, on [place] O2 Abdomen: Soft, non-tender, protuberant, bowel sounds positive. Extremities: No clubbing, cyanosis, or edema. Pulses are 2+ Psych: Pt's affect is appropriate. Pt is cooperative Skin: Bruising noted on bilateral upper extremities, right upper extremity IV looks okay Neuro: Alert and oriented x 4, no apparent cognitive deficits, cranial nerves II through XII grossly intact other than hard of hearing-chronic   MOTOR: RUE: 5/5 Deltoid, 5/5 Biceps, 5/5 Triceps,5/5 Grip LUE: 5/5 Deltoid, 5/5 Biceps, 5/5 Triceps, 5/5 Grip RLE: HF 5/5, KE 5/5, ADF 5/5, APF 5/5 LLE: HF 5/5, KE 5/5, ADF 5/5, APF 5/5      SENSORY: Normal to touch all 4 extremities although altered in distal lower extremities length dependent distribution Exam stable 4/5    Assessment/Plan: 1. Functional deficits which require 3+ hours per day of interdisciplinary therapy in a comprehensive inpatient rehab setting. Physiatrist is providing close team supervision and 24 hour management of active medical problems listed below. Physiatrist and rehab team continue to assess barriers to discharge/monitor patient progress toward functional and medical goals  Care Tool:  Bathing    Body parts bathed by patient: Right arm, Left arm, Chest, Abdomen, Front perineal area, Buttocks, Right upper leg, Left upper leg, Right lower leg, Left lower leg         Bathing assist Assist Level: Minimal Assistance - Patient > 75%     Upper Body Dressing/Undressing Upper body dressing   What is the patient wearing?: Pull over shirt    Upper body assist Assist Level: Set up assist    Lower Body Dressing/Undressing Lower body dressing      What is the patient wearing?: Underwear/pull up, Pants     Lower body assist Assist for lower body dressing: Maximal Assistance - Patient 25 - 49%     Toileting Toileting    Toileting  assist Assist for toileting: Minimal Assistance - Patient > 75%     Transfers Chair/bed transfer  Transfers assist     Chair/bed transfer assist level: Contact Guard/Touching assist     Locomotion Ambulation   Ambulation assist   Ambulation activity did not occur: Safety/medical concerns          Walk 10 feet activity   Assist  Walk 10 feet activity did not occur: Safety/medical concerns        Walk 50 feet activity   Assist Walk 50 feet with 2 turns activity did not occur: Safety/medical concerns         Walk 150 feet activity   Assist Walk 150 feet activity did not occur: Safety/medical concerns         Walk 10 feet on uneven surface  activity   Assist Walk 10 feet  on uneven surfaces activity did not occur: Safety/medical concerns         Wheelchair     Assist Is the patient using a wheelchair?: Yes Type of Wheelchair: Manual    Wheelchair assist level: Dependent - Patient 0%      Wheelchair 50 feet with 2 turns activity    Assist        Assist Level: Dependent - Patient 0%   Wheelchair 150 feet activity     Assist      Assist Level: Dependent - Patient 0%   Blood pressure 103/82, pulse (!) 113, temperature 98.5 F (36.9 C), temperature source Oral, resp. rate 20, height 5\' 7"  (1.702 m), weight 100.2 kg, SpO2 95%.  Medical Problem List and Plan: 1. Functional deficits secondary to acute respiratory distress/acute on chronic hypoxia/Pseudomonas pneumonia.  Prednisone taper.  Patient with chronic O2.             -Patient may shower             -ELOS/Goals: 10 to 12 days, PT OT mod I             -Continue CIR  -15/7 Schedule   -Lasix 20mg  daily started- continue for now, Cr 1.09 recheck monday 2.  Antithrombotics: -DVT/anticoagulation:  Pharmaceutical: Xarelto for history of pulmonary emboli             -antiplatelet therapy: N/A 3. Pain Management: Oxycodone as needed             -Hx of chronic lower back pain, PDMP indicates norco 5 BID PRN  - 4/5 pain controlled, not frequently using as needed 4. Mood/Behavior/Sleep: Provide emotional support             -antipsychotic agents: N/A 5. Neuropsych/cognition: This patient is capable of making decisions on his own behalf. 6. Skin/Wound Care: Routine skin checks 7. Fluids/Electrolytes/Nutrition: Routine in and outs with follow-up chemistries 8.  History of recurrent non-small cell lung cancer.  Previously received chemotherapy and immunotherapy in 2020 and 2022.  Followed outpatient by pulmonary team as well as oncology services Dr. Si Gaul 9.  History of radiation-induced IPF.  Continue bronchodilators and steroid as directed 10.  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  Followed outpatient by urology services Dr. Cardell Peach.  Continue Flomax             -Reports dark urine at baseline  -4/5 continent, reports at baseline 11.  Tingling bilateral feet/neuropathy-chronic             -Potentially paraneoplastic, continue to monitor  12.  Leukocytosis  -4/3  WBC within normal limits today 13. Constipation   -4/3 LBM 4/1, patient to try suppository today, continue MiraLAX and Senokot.  Add sorbitol as needed  -4/4 LBM today, large continue to monitor  -4/5 improved continue to monitor   Filed Weights   05/05/23 1344 05/07/23 0404 05/08/23 0530  Weight: 100.9 kg 101.2 kg 100.2 kg     LOS: 3 days A FACE TO FACE EVALUATION WAS PERFORMED  Fanny Dance 05/08/2023, 11:33 AM

## 2023-05-09 DIAGNOSIS — J9600 Acute respiratory failure, unspecified whether with hypoxia or hypercapnia: Secondary | ICD-10-CM | POA: Diagnosis not present

## 2023-05-09 DIAGNOSIS — K5900 Constipation, unspecified: Secondary | ICD-10-CM | POA: Diagnosis not present

## 2023-05-09 DIAGNOSIS — M545 Low back pain, unspecified: Secondary | ICD-10-CM | POA: Diagnosis not present

## 2023-05-09 DIAGNOSIS — N400 Enlarged prostate without lower urinary tract symptoms: Secondary | ICD-10-CM | POA: Diagnosis not present

## 2023-05-09 NOTE — Progress Notes (Signed)
 PROGRESS NOTE   Subjective/Complaints:  Working with therapy, reports he feels that his breathing continues to slowly improve.  Has been having fairly regular bowel movements, yesterday's bowel movement was slightly hard but he says he has restarted the MiraLAX and would like to hold off on any additional medications for now.  ROS: Patient denies fever, chills, new vision changes, dizziness, nausea, abd pain, vomiting, diarrhea,  or chest pain, headache, or mood change.  + shortness of breath-gradually improving, worsened with exertion + constipation- LBM yesterday, slightly hard  Objective:   No results found. No results for input(s): "WBC", "HGB", "HCT", "PLT" in the last 72 hours.  Recent Labs    05/08/23 0847  NA 141  K 4.2  CL 96*  CO2 33*  GLUCOSE 116*  BUN 21  CREATININE 1.09  CALCIUM 9.1    Intake/Output Summary (Last 24 hours) at 05/09/2023 1058 Last data filed at 05/09/2023 0800 Gross per 24 hour  Intake 537 ml  Output 1050 ml  Net -513 ml        Physical Exam: Vital Signs Blood pressure 102/67, pulse 100, temperature 97.8 F (36.6 C), temperature source Oral, resp. rate 20, height 5\' 7"  (1.702 m), weight 100.2 kg, SpO2 94%.   General: No apparent distress, sitting in chair, PT in room for session HEENT: Head is normocephalic, atraumatic Neck: Supple without JVD or lymphadenopathy Heart: Mild Tachycardia Chest: CTA bilaterally without wheezes, rales, or rhonchi; no distress, on [place] O2 8L  Abdomen: Soft, non-tender, protuberant, bowel sounds positive. Extremities: No clubbing, cyanosis, or edema. Pulses are 2+ Psych: Pt's affect is appropriate. Pt is cooperative Skin: Bruising noted on bilateral upper extremities, right upper extremity IV looks okay Neuro: Alert and oriented x 4, no apparent cognitive deficits, cranial nerves II through XII grossly intact other than hard of hearing-chronic    MOTOR: RUE: 5/5 Deltoid, 5/5 Biceps, 5/5 Triceps,5/5 Grip LUE: 5/5 Deltoid, 5/5 Biceps, 5/5 Triceps, 5/5 Grip RLE: HF 5/5, KE 5/5, ADF 5/5, APF 5/5 LLE: HF 5/5, KE 5/5, ADF 5/5, APF 5/5     SENSORY: Normal to touch all 4 extremities although altered in distal lower extremities length dependent distribution Exam stable 4/6    Assessment/Plan: 1. Functional deficits which require 3+ hours per day of interdisciplinary therapy in a comprehensive inpatient rehab setting. Physiatrist is providing close team supervision and 24 hour management of active medical problems listed below. Physiatrist and rehab team continue to assess barriers to discharge/monitor patient progress toward functional and medical goals  Care Tool:  Bathing    Body parts bathed by patient: Right arm, Left arm, Chest, Abdomen, Front perineal area, Buttocks, Right upper leg, Left upper leg, Right lower leg, Left lower leg         Bathing assist Assist Level: Minimal Assistance - Patient > 75%     Upper Body Dressing/Undressing Upper body dressing   What is the patient wearing?: Pull over shirt    Upper body assist Assist Level: Set up assist    Lower Body Dressing/Undressing Lower body dressing      What is the patient wearing?: Underwear/pull up, Pants     Lower body assist  Assist for lower body dressing: Maximal Assistance - Patient 25 - 49%     Toileting Toileting    Toileting assist Assist for toileting: Minimal Assistance - Patient > 75%     Transfers Chair/bed transfer  Transfers assist     Chair/bed transfer assist level: Contact Guard/Touching assist     Locomotion Ambulation   Ambulation assist   Ambulation activity did not occur: Safety/medical concerns          Walk 10 feet activity   Assist  Walk 10 feet activity did not occur: Safety/medical concerns        Walk 50 feet activity   Assist Walk 50 feet with 2 turns activity did not occur: Safety/medical  concerns         Walk 150 feet activity   Assist Walk 150 feet activity did not occur: Safety/medical concerns         Walk 10 feet on uneven surface  activity   Assist Walk 10 feet on uneven surfaces activity did not occur: Safety/medical concerns         Wheelchair     Assist Is the patient using a wheelchair?: Yes Type of Wheelchair: Manual    Wheelchair assist level: Dependent - Patient 0%      Wheelchair 50 feet with 2 turns activity    Assist        Assist Level: Dependent - Patient 0%   Wheelchair 150 feet activity     Assist      Assist Level: Dependent - Patient 0%   Blood pressure 102/67, pulse 100, temperature 97.8 F (36.6 C), temperature source Oral, resp. rate 20, height 5\' 7"  (1.702 m), weight 100.2 kg, SpO2 94%.  Medical Problem List and Plan: 1. Functional deficits secondary to acute respiratory distress/acute on chronic hypoxia/Pseudomonas pneumonia.  Prednisone taper.  Patient with chronic O2.             -Patient may shower             -ELOS/Goals: 10 to 12 days, PT OT mod I             -Continue CIR  -15/7 Schedule   -Lasix 20mg  daily started- , Cr 1.09 recheck Monday -Hold lasix tomorrow, check labs, consider restating if need 2.  Antithrombotics: -DVT/anticoagulation:  Pharmaceutical: Xarelto for history of pulmonary emboli             -antiplatelet therapy: N/A 3. Pain Management: Oxycodone as needed             -Hx of chronic lower back pain, PDMP indicates norco 5 BID PRN  - 4/5 pain controlled, not frequently using as needed 4. Mood/Behavior/Sleep: Provide emotional support             -antipsychotic agents: N/A 5. Neuropsych/cognition: This patient is capable of making decisions on his own behalf. 6. Skin/Wound Care: Routine skin checks 7. Fluids/Electrolytes/Nutrition: Routine in and outs with follow-up chemistries 8.  History of recurrent non-small cell lung cancer.  Previously received chemotherapy and  immunotherapy in 2020 and 2022.  Followed outpatient by pulmonary team as well as oncology services Dr. Si Gaul 9.  History of radiation-induced IPF.  Continue bronchodilators and steroid as directed 10.  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  Followed outpatient by urology services Dr. Cardell Peach.  Continue Flomax             -Reports dark urine at baseline  -4/5-6 continent, reports at baseline  11.  Tingling bilateral feet/neuropathy-chronic             -Potentially paraneoplastic, continue to monitor  12.  Leukocytosis  -4/3 WBC within normal limits today 13. Constipation   -4/3 LBM 4/1, patient to try suppository today, continue MiraLAX and Senokot.  Add sorbitol as needed  -4/4 LBM today, large continue to monitor  -4/6 regular Bms, if continue to be a little hard could consider additional medication, also has PRNs   Filed Weights   05/05/23 1344 05/07/23 0404 05/08/23 0530  Weight: 100.9 kg 101.2 kg 100.2 kg     LOS: 4 days A FACE TO FACE EVALUATION WAS PERFORMED  Fanny Dance 05/09/2023, 10:58 AM

## 2023-05-09 NOTE — Progress Notes (Signed)
 Occupational Therapy Session Note  Patient Details  Name: Philip [name] MRN: [id number] Date of Birth: 16-Sep-1955  Today's Date: 05/09/2023 OT Individual Time: 0100-0145 OT Individual Time Calculation (min): 45 min    Short Term Goals: Week 1:  OT Short Term Goal 1 (Week 1): Pt will complete bathing at shower level while seated utilizing compensatory techniques while monitoring SpO2 and exertion level. OT Short Term Goal 2 (Week 1): Pt will completed dressing while seated with SBA while utilizing energy conservation techniques.  Skilled Therapeutic Interventions/Progress Updates:     Patient seated in the recliner at the time of arrival. Patient in agreement with completing skilled OT to improve upon a  safe and Ind return to home, reducing the burden of care for the care provider.  The pt indicated that he rested well during night, with  a reported pain response of a 4 on 0-10 located in the  kidney region. The pt presents with 02 saturation from 85- 94%. The pt intiated the therapeutic process by coming from sit to stand 4x with 1 rest break. The pt went on to complete the upper body cycle in standing with rest breaks as needed. The pt was able to complete the upper body cycle for 15 minutes  in duration with 3 restbreak. The pt went on to complete UB exercises using  a 3lb dumb bell 2 sets of 10 for bicep curls and horizontal abductions with rest breaks as needed. The pt required 1 rest break with each exercise.  At the end of the session, the call light and bed side table were both placed within reach with all additional needs addressed.  Therapy Documentation Precautions:  Precautions Precautions: Fall, Other (comment) Recall of Precautions/Restrictions: Intact Precaution/Restrictions Comments: Monitor O2. Goal: 88% or greater on 6L Restrictions Weight Bearing Restrictions Per Provider Order: No  Therapy/Group: Individual Therapy  Lavona Mound 05/09/2023, 4:42 PM

## 2023-05-09 NOTE — Progress Notes (Signed)
 Physical Therapy Session Note  Patient Details  Name: Philip [name] MRN: [id number] Date of Birth: [birth year]  Today's Date: 05/09/2023 PT Individual Time: 0912-1013, 1478-2956 PT Individual Time Calculation (min): 61 min, 43 min   Short Term Goals: Week 1:  PT Short Term Goal 1 (Week 1): pt will tolerate sitting OOB between sessions PT Short Term Goal 2 (Week 1): pt will ambulate 25 feet with LRAD with continuous O2 montioring  Skilled Therapeutic Interventions/Progress Updates:      Pt supine in bed upon arrival. Pt agreeable to therapy. Pt deneis any pain.   Donned ted hose while in bed with total A. Pt performed supine to sit to bed features and supervision.   MD present for AM rounds, confirmed with MD okay to increase to 10L for ambulation for O2 management.   Pt ambulated 1x14, 1x32, 1x,35, 1x28, 1x27 with close WC follow and close supervision on 10L O2. Vitals continuously monitored HR 107-128, SpO2 decreased to as low as 70%, and increased to as high as 99% during ambulation. Pt reports he is able to sense when it is going to drop--pt O2 94-99 with ambulation but decreases to below 80 as low as 70 with seated rest break. Pt required ~2 min for recovery with pursed lip breathing and use of pep flutter valve.   Pt reports he noticed increased ease with breathing today, and shorter recovery time.   Pt seated in recliner at end of session with all needs within reach and chair alarm on.   Treatment Session 2   Pt seated in recliner upon arrival. Pt agreeable to therapy. Pt reports 4/10 R kidney pain, pt requesting pain medicine, notified nursing.   Pt on 8L throughout session: pt SpO2 dropped as low as 80, increased to 88-92 within 2 min with pursed lip breathing and use of pep flutter valve.   3x5 sit to stand with supervision   Standing marching x5 B; pt required min A for stand to sit as pt attempting to sit prematurely-pt required increase to 10L and 3.5 min for recovery  to 88, and ~4 min to increase to 92  with pursed lip breathing and pep flutter valve  Pt completed 1x5 seated LAQ B while on 10L with SpO2 86-90%  Therapist decreased to 8L  Pt completed seated heel toe raises x10 B while on 8L while maintaining SpO2 at 88%   1x5 standing heel raises with R HHA, pt SpO2 decreased to 77, required ~2 min to increase to 88  1x5 seated marching x 5 B pt SpO2 reduce to 80 requiring <1 min to increase to 88 on 8L with pursed lip breathing  Pt provided seated rest break between exercises for overall pt fatigue.   Pt on 6L at end of session while resting in recliner, with all needs within reach and chair alarm on.      Therapy Documentation Precautions:  Precautions Precautions: Fall, Other (comment) Recall of Precautions/Restrictions: Intact Precaution/Restrictions Comments: Monitor O2. Goal: 88% or greater on 6L Restrictions Weight Bearing Restrictions Per Provider Order: No   Therapy/Group: Individual Therapy  [facility] [place], [place], DPT  05/09/2023, 9:21 AM

## 2023-05-10 DIAGNOSIS — J9600 Acute respiratory failure, unspecified whether with hypoxia or hypercapnia: Secondary | ICD-10-CM | POA: Diagnosis not present

## 2023-05-10 DIAGNOSIS — R5381 Other malaise: Secondary | ICD-10-CM

## 2023-05-10 DIAGNOSIS — K5901 Slow transit constipation: Secondary | ICD-10-CM

## 2023-05-10 DIAGNOSIS — R600 Localized edema: Secondary | ICD-10-CM

## 2023-05-10 DIAGNOSIS — E875 Hyperkalemia: Secondary | ICD-10-CM | POA: Diagnosis not present

## 2023-05-10 LAB — CBC
HCT: 32.9 % — ABNORMAL LOW (ref 39.0–52.0)
Hemoglobin: 10 g/dL — ABNORMAL LOW (ref 13.0–17.0)
MCH: 29.2 pg (ref 26.0–34.0)
MCHC: 30.4 g/dL (ref 30.0–36.0)
MCV: 95.9 fL (ref 80.0–100.0)
Platelets: 167 10*3/uL (ref 150–400)
RBC: 3.43 MIL/uL — ABNORMAL LOW (ref 4.22–5.81)
RDW: 15.3 % (ref 11.5–15.5)
WBC: 6 10*3/uL (ref 4.0–10.5)
nRBC: 0 % (ref 0.0–0.2)

## 2023-05-10 LAB — BASIC METABOLIC PANEL WITH GFR
Anion gap: 9 (ref 5–15)
BUN: 18 mg/dL (ref 8–23)
CO2: 34 mmol/L — ABNORMAL HIGH (ref 22–32)
Calcium: 8.6 mg/dL — ABNORMAL LOW (ref 8.9–10.3)
Chloride: 98 mmol/L (ref 98–111)
Creatinine, Ser: 1.17 mg/dL (ref 0.61–1.24)
GFR, Estimated: >60 mL/min (ref >60)
Glucose, Bld: 102 mg/dL — ABNORMAL HIGH (ref 70–99)
Potassium: 5.6 mmol/L — ABNORMAL HIGH (ref 3.5–5.1)
Sodium: 141 mmol/L (ref 135–145)

## 2023-05-10 MED ORDER — IPRATROPIUM-ALBUTEROL 0.5-2.5 (3) MG/3ML IN SOLN
3.0000 mL | Freq: Four times a day (QID) | RESPIRATORY_TRACT | Status: DC
  Administered 2023-05-10 – 2023-05-16 (×25): 3 mL via RESPIRATORY_TRACT
  Filled 2023-05-10 (×3): qty 3
  Filled 2023-05-10: qty 6
  Filled 2023-05-10 (×20): qty 3

## 2023-05-10 MED ORDER — FUROSEMIDE 20 MG PO TABS: 20.0000 mg | ORAL_TABLET | Freq: Every day | ORAL | Status: DC

## 2023-05-10 MED ORDER — FUROSEMIDE 20 MG PO TABS
20.0000 mg | ORAL_TABLET | Freq: Every day | ORAL | Status: AC
  Administered 2023-05-10 – 2023-05-12 (×3): 20 mg via ORAL
  Filled 2023-05-10 (×3): qty 1

## 2023-05-10 MED ORDER — SODIUM ZIRCONIUM CYCLOSILICATE 10 G PO PACK
10.0000 g | PACK | Freq: Once | ORAL | Status: AC
  Administered 2023-05-10: 10 g via ORAL
  Filled 2023-05-10: qty 1

## 2023-05-10 NOTE — Progress Notes (Signed)
 Physical Therapy Session Note  Patient Details  Name: Philip [name] MRN: [id number] Date of Birth: 02-05-1955  {CHL IP REHAB PT TIME CALCULATION:[id number]}  Short Term Goals: Week 1:  PT Short Term Goal 1 (Week 1): pt will tolerate sitting OOB between sessions PT Short Term Goal 2 (Week 1): pt will ambulate 25 feet with LRAD with continuous O2 montioring  Skilled Therapeutic Interventions/Progress Updates:      Pt seated in WC upon arrival. Pt agreeable to therapy. Pt denies any pain.  Pt wearing B LE ted hose.   Pt on 10L at start of session with SpO2 at 96 and HR 107; HR 106-118 throughout session. Pt remained on 10L throughout session:   Pt ambulated 4x20, 2x30, 1x40 feet with use of oxygen tank as AD and close supervision and close WC follow, with continuous monitoring.   On first few ambulation trials pt desatted to 79 with recovery to 90 and above within 1 min, as pt progressed gait distance as well as fatigue pt desatted to as low as 74, but able to recover to 90 and above within 2 min with pursed lip breathing and use of pep flutter valve.  Followed up with pt regarding sitting OOB between sessions. Pt reports yesterday he sat in recliner all day yesterday 9:00 am until 8:00 pm yesterday. Pt reports improved ability to sit OOB 2/2 improved ability to sleep at night.   Pt performed step up onto yoga block x3 with seated rest break between reps. On first trial SpO2 started at 92 and decreased to 85, on 2nd trial started at 92, decreased to 87, on 3rd trail, started at 94, decreased to 87. All trials pt increased to 90% within 1-2 minutes.   Pt on 6L at end of session while at rest.   Pt seated in recliner at end of session with all needs within reach and chair alarm on.                Therapy Documentation Precautions:  Precautions Precautions: Fall, Other (comment) Recall of Precautions/Restrictions: Intact Precaution/Restrictions Comments: Monitor O2.  Goal: 88% or greater on 6L Restrictions Weight Bearing Restrictions Per Provider Order: No General:   Vital Signs:   Pain: Pain Assessment Pain Scale: 0-10 Pain Score: Asleep Pain Location: Flank Pain Intervention(s): Medication (See eMAR) Mobility:   Locomotion :    Trunk/Postural Assessment :    Balance:   Exercises:   Other Treatments:      Therapy/Group: Individual Therapy  [facility] [place], [place], DPT  05/10/2023, 7:45 AM

## 2023-05-10 NOTE — Progress Notes (Signed)
 Occupational Therapy Session Note  Patient Details  Name: Philip [name] MRN: [id number] Date of Birth: 01-30-1956  Today's Date: 05/10/2023 OT Individual Time: [phone] OT Individual Time Calculation (min): 40 min    Short Term Goals: Week 1:  OT Short Term Goal 1 (Week 1): Pt will complete bathing at shower level while seated utilizing compensatory techniques while monitoring SpO2 and exertion level. OT Short Term Goal 2 (Week 1): Pt will completed dressing while seated with SBA while utilizing energy conservation techniques.  Skilled Therapeutic Interventions/Progress Updates:    Patient received supine in bed, eager to get out of bed for therapy session.  Patient agreeable to wash at sink.  Patient with starting O2 saturation of 87 on 10L O2. Patient with consistent drop in O2 with each activity - sitting, transfer, sit to stand, scooting.  Patient encouraged to stop and regain O2 level prior to pushing forward.  Patient with tendency to move quickly, which was detrimental to maintaining consistent O2 levels.   Patient very comfortable with removing O2 for shaving, face care, to don a shirt, and quickly replaced as soon as feasible.  Patient stands with supervision for ~ 20 seconds.  Patient progressing well, and pleased with his progress.  Patient needs encouragement throughout session to take seated quiet rest breaks.  Patient left up in wheelchair with direct handoff to his primary PT.    Therapy Documentation Precautions:  Precautions Precautions: Fall, Other (comment) Recall of Precautions/Restrictions: Intact Precaution/Restrictions Comments: Monitor O2. Goal: 88% or greater on 6L Restrictions Weight Bearing Restrictions Per Provider Order: No  Pain Assessment Pain Scale: 0-10 Pain Score: 4  Pain Location: Flank Pain Intervention(s): Medication (See eMAR)    Therapy/Group: Individual Therapy  [name] 05/10/2023, 12:41 PM

## 2023-05-10 NOTE — Progress Notes (Signed)
 PROGRESS NOTE   Subjective/Complaints:  Breathing better and sleeping better at night as a result. Still trying to push his activity tolerance.   ROS: Patient denies fever, rash, sore throat, blurred vision, dizziness, nausea, vomiting, diarrhea, cough,   chest pain, joint or back/neck pain, headache, or mood change.   Objective:   No results found. Recent Labs    05/10/23 0509  WBC 6.0  HGB 10.0*  HCT 32.9*  PLT 167    Recent Labs    05/08/23 0847 05/10/23 0509  NA 141 141  K 4.2 5.6*  CL 96* 98  CO2 33* 34*  GLUCOSE 116* 102*  BUN 21 18  CREATININE 1.09 1.17  CALCIUM 9.1 8.6*    Intake/Output Summary (Last 24 hours) at 05/10/2023 0953 Last data filed at 05/10/2023 0757 Gross per 24 hour  Intake 480 ml  Output 1700 ml  Net -1220 ml        Physical Exam: Vital Signs Blood pressure 120/75, pulse 100, temperature 98.7 F (37.1 C), temperature source Oral, resp. rate 16, height 5\' 7"  (1.702 m), weight 100 kg, SpO2 99%.   Constitutional: No distress . Vital signs reviewed. HEENT: NCAT, EOMI, oral membranes moist Neck: supple Cardiovascular: RRR without murmur. No JVD    Respiratory/Chest: CTA Bilaterally with a few scattered rhonchi. Normal effort    GI/Abdomen: BS +, non-tender, non-distended Ext: no clubbing, cyanosis, 1+ edema R>L LE Psych: pleasant and cooperative  Skin: Bruising noted on bilateral upper extremities, right upper extremity IV looks okay Neuro: Alert and oriented x 4, no apparent cognitive deficits, cranial nerves II through XII grossly intact other than hard of hearing-chronic   MOTOR: RUE: 5/5 Deltoid, 5/5 Biceps, 5/5 Triceps,5/5 Grip LUE: 5/5 Deltoid, 5/5 Biceps, 5/5 Triceps, 5/5 Grip RLE: HF 5/5, KE 5/5, ADF 5/5, APF 5/5 LLE: HF 5/5, KE 5/5, ADF 5/5, APF 5/5     SENSORY: Normal to touch all 4 extremities although altered in distal lower extremities length dependent  distribution Prior neuro assessment is c/w today's exam 05/10/2023.     Assessment/Plan: 1. Functional deficits which require 3+ hours per day of interdisciplinary therapy in a comprehensive inpatient rehab setting. Physiatrist is providing close team supervision and 24 hour management of active medical problems listed below. Physiatrist and rehab team continue to assess barriers to discharge/monitor patient progress toward functional and medical goals  Care Tool:  Bathing    Body parts bathed by patient: Right arm, Left arm, Chest, Abdomen, Front perineal area, Buttocks, Right upper leg, Left upper leg, Right lower leg, Left lower leg         Bathing assist Assist Level: Minimal Assistance - Patient > 75%     Upper Body Dressing/Undressing Upper body dressing   What is the patient wearing?: Pull over shirt    Upper body assist Assist Level: Set up assist    Lower Body Dressing/Undressing Lower body dressing      What is the patient wearing?: Underwear/pull up, Pants     Lower body assist Assist for lower body dressing: Maximal Assistance - Patient 25 - 49%     Toileting Toileting    Toileting assist Assist for  toileting: Minimal Assistance - Patient > 75%     Transfers Chair/bed transfer  Transfers assist     Chair/bed transfer assist level: Contact Guard/Touching assist     Locomotion Ambulation   Ambulation assist   Ambulation activity did not occur: Safety/medical concerns          Walk 10 feet activity   Assist  Walk 10 feet activity did not occur: Safety/medical concerns        Walk 50 feet activity   Assist Walk 50 feet with 2 turns activity did not occur: Safety/medical concerns         Walk 150 feet activity   Assist Walk 150 feet activity did not occur: Safety/medical concerns         Walk 10 feet on uneven surface  activity   Assist Walk 10 feet on uneven surfaces activity did not occur: Safety/medical  concerns         Wheelchair     Assist Is the patient using a wheelchair?: Yes Type of Wheelchair: Manual    Wheelchair assist level: Dependent - Patient 0%      Wheelchair 50 feet with 2 turns activity    Assist        Assist Level: Dependent - Patient 0%   Wheelchair 150 feet activity     Assist      Assist Level: Dependent - Patient 0%   Blood pressure 120/75, pulse 100, temperature 98.7 F (37.1 C), temperature source Oral, resp. rate 16, height 5\' 7"  (1.702 m), weight 100 kg, SpO2 99%.  Medical Problem List and Plan: 1. Functional deficits secondary to acute respiratory distress/acute on chronic hypoxia/Pseudomonas pneumonia.  Prednisone taper.  Patient with chronic O2.             -Patient may shower             -ELOS/Goals: 10 to 12 days, PT OT mod I             -Continue CIR therapies including PT, OT   -15/7 Schedule   -Lasix 20mg  daily started- , Cr 1.09 recheck Monday -Hold lasix tomorrow, check labs, consider restating if need 2.  Antithrombotics: -DVT/anticoagulation:  Pharmaceutical: Xarelto for history of pulmonary emboli             -antiplatelet therapy: N/A 3. Pain Management: Oxycodone as needed             -Hx of chronic lower back pain, PDMP indicates norco 5 BID PRN  - 4/5 pain controlled, not frequently using as needed 4. Mood/Behavior/Sleep: Provide emotional support             -antipsychotic agents: N/A 5. Neuropsych/cognition: This patient is capable of making decisions on his own behalf. 6. Skin/Wound Care: Routine skin checks 7. Fluids/Electrolytes/Nutrition: Routine in and outs with follow-up chemistries 8.  History of recurrent non-small cell lung cancer.  Previously received chemotherapy and immunotherapy in 2020 and 2022.  Followed outpatient by pulmonary team as well as oncology services Dr. Si Gaul 9.  History of radiation-induced IPF.  Continue bronchodilators and steroid as directed 10.  History of BPH with  chronic right ureteral UPJ stenosis.  Followed outpatient by urology services Dr. Cardell Peach.  Continue Flomax             -Reports dark urine at baseline  -4/7 continent, reports at baseline 11.  Tingling bilateral feet/neuropathy-chronic             -Potentially paraneoplastic, continue  to monitor  12.  Leukocytosis  -4/3 WBC within normal limits today  4/7 resolved 13. Constipation   -4/3 LBM 4/1, patient to try suppository today, continue MiraLAX and Senokot.  Add sorbitol as needed  -4/4 LBM today, large continue to monitor  -LBM 4/6  14. Lower ext edema:  -4/7 hyperkalemic (5.6) on labs today -ongoing  edema Filed Weights   05/07/23 0404 05/08/23 0530 05/10/23 0500  Weight: 101.2 kg 100.2 kg 100 kg   -resume lasix 20mg  daily today, check labs tomorrow  -lokelma x 1 today   LOS: 5 days A FACE TO FACE EVALUATION WAS PERFORMED  Ranelle Oyster 05/10/2023, 9:53 AM

## 2023-05-10 NOTE — Progress Notes (Signed)
 Occupational Therapy Session Note  Patient Details  Name: MANAS HICKLING MRN: [id number] Date of Birth: 1955-11-08  Today's Date: 05/10/2023 OT Individual Time: 4098-1191 OT Individual Time Calculation (min): 30 min    Short Term Goals: Week 1:  OT Short Term Goal 1 (Week 1): Pt will complete bathing at shower level while seated utilizing compensatory techniques while monitoring SpO2 and exertion level. OT Short Term Goal 2 (Week 1): Pt will completed dressing while seated with SBA while utilizing energy conservation techniques.  Skilled Therapeutic Interventions/Progress Updates:    Pt received in recliner finishing his lunch.  Talked with pt about therapy recommendations to take a breath before talking, to slow down, and to slow down his talking.  During lunch he was on 6L of O2 but sat rates less than 87%.  Increased to 8L and O2 sats stayed higher than 88-90%.    Despite recommendations, pt did talk a lot and fast!   Engaged in UE strengthening with red theraband for chest expansion.  Pt only able to do 6-8 reps at a time before O2 levels dropped to the 70s but resumed to low 90s quickly with a rest break.  Pt continued with theraband exercise with shoulder flexion/abduction   Had pt work on active stretching of LEs using gait belt.    When his sats drop with movement,  he is able to increase his oxygen levels with relaxed breathing.   Pt resting in recliner with all needs met.    Therapy Documentation Precautions:  Precautions Precautions: Fall, Other (comment) Recall of Precautions/Restrictions: Intact Precaution/Restrictions Comments: Monitor O2. Goal: 88% or greater on 6L Restrictions Weight Bearing Restrictions Per Provider Order: No    Vital Signs: Oxygen Therapy SpO2: 94 % O2 Device: High Flow Nasal Cannula O2 Flow Rate (L/min): 7 L/min Pain: Pain Assessment Pain Scale: 0-10 Pain Score: 3  Pain Location: Flank Pain Intervention(s): Medication (See  eMAR) ADL: ADL Eating: Independent Where Assessed-Eating: Bed level Grooming: Modified independent Where Assessed-Grooming: Sitting at sink Upper Body Bathing: Setup Where Assessed-Upper Body Bathing: Sitting at sink Lower Body Bathing: Minimal assistance Where Assessed-Lower Body Bathing: Sitting at sink, Standing at sink Upper Body Dressing: Setup Where Assessed-Upper Body Dressing: Wheelchair Lower Body Dressing: Maximal assistance Where Assessed-Lower Body Dressing: Wheelchair Toileting: Contact guard Where Assessed-Toileting: Teacher, adult education: Furniture conservator/restorer Method: Surveyor, minerals: Bedside commode, Chiropractor Transfer: Not assessed Film/video editor: Not assessed     Therapy/Group: Individual Therapy  Cassell Voorhies 05/10/2023, 9:07 AM

## 2023-05-10 NOTE — Progress Notes (Signed)
 Occupational Therapy Session Note  Patient Details  Name: Philip [name] MRN: [id number] Date of Birth: 05/23/1955  Session 1: {CHL IP REHAB OT TIME CALCULATIONS:304400400} Session 2:  {CHL IP REHAB OT TIME CALCULATIONS:304400400}  Short Term Goals: Week 1:  OT Short Term Goal 1 (Week 1): Pt will complete bathing at shower level while seated utilizing compensatory techniques while monitoring SpO2 and exertion level. OT Short Term Goal 2 (Week 1): Pt will completed dressing while seated with SBA while utilizing energy conservation techniques.  Skilled Therapeutic Interventions/Progress Updates:    Session 1:  Patient agreeable to participate in OT session. Reports *** pain level.   Patient participated in skilled OT session focusing on ***. Therapist facilitated/assessed/developed/educated/integrated/elicited *** in order to improve/facilitate/promote   Session 2: Patient agreeable to participate in OT session. Reports *** pain level.   Patient participated in skilled OT session focusing on ***. Therapist facilitated/assessed/developed/educated/integrated/elicited *** in order to improve/facilitate/promote    Therapy Documentation Precautions:  Precautions Precautions: Fall, Other (comment) Recall of Precautions/Restrictions: Intact Precaution/Restrictions Comments: Monitor O2. Goal: 88% or greater on 6L Restrictions Weight Bearing Restrictions Per Provider Order: No  Therapy/Group: Individual Therapy  Limmie Patricia, OTR/L,CBIS  Supplemental OT - [facility] Secure Chat Preferred   05/10/2023, 5:06 PM

## 2023-05-10 NOTE — Plan of Care (Signed)

## 2023-05-11 DIAGNOSIS — J9600 Acute respiratory failure, unspecified whether with hypoxia or hypercapnia: Secondary | ICD-10-CM | POA: Diagnosis not present

## 2023-05-11 DIAGNOSIS — K5900 Constipation, unspecified: Secondary | ICD-10-CM | POA: Diagnosis not present

## 2023-05-11 DIAGNOSIS — M545 Low back pain, unspecified: Secondary | ICD-10-CM | POA: Diagnosis not present

## 2023-05-11 DIAGNOSIS — N400 Enlarged prostate without lower urinary tract symptoms: Secondary | ICD-10-CM | POA: Diagnosis not present

## 2023-05-11 LAB — BASIC METABOLIC PANEL WITH GFR
Anion gap: 7 (ref 5–15)
BUN: 18 mg/dL (ref 8–23)
CO2: 34 mmol/L — ABNORMAL HIGH (ref 22–32)
Calcium: 8.6 mg/dL — ABNORMAL LOW (ref 8.9–10.3)
Chloride: 97 mmol/L — ABNORMAL LOW (ref 98–111)
Creatinine, Ser: 1.15 mg/dL (ref 0.61–1.24)
GFR, Estimated: >60 mL/min (ref >60)
Glucose, Bld: 104 mg/dL — ABNORMAL HIGH (ref 70–99)
Potassium: 4.1 mmol/L (ref 3.5–5.1)
Sodium: 138 mmol/L (ref 135–145)

## 2023-05-11 MED ORDER — HYDROCODONE-ACETAMINOPHEN 5-325 MG PO TABS
1.0000 | ORAL_TABLET | Freq: Two times a day (BID) | ORAL | Status: DC
  Administered 2023-05-11 – 2023-05-19 (×16): 1 via ORAL
  Filled 2023-05-11 (×16): qty 1

## 2023-05-11 NOTE — Progress Notes (Signed)
 PROGRESS NOTE   Subjective/Complaints:  Reports he was able to tolerate more activity today with therapy. Pt asks if hydrocodone could be schedule to keep ahead of the pain.   ROS: Patient denies fever, rash, sore throat, blurred vision, dizziness, nausea, vomiting, diarrhea, cough,   chest pain,  headache, or mood change.  + shortness of breath- improving   Objective:   No results found. Recent Labs    05/10/23 0509  WBC 6.0  HGB 10.0*  HCT 32.9*  PLT 167    Recent Labs    05/10/23 0509 05/11/23 0503  NA 141 138  K 5.6* 4.1  CL 98 97*  CO2 34* 34*  GLUCOSE 102* 104*  BUN 18 18  CREATININE 1.17 1.15  CALCIUM 8.6* 8.6*    Intake/Output Summary (Last 24 hours) at 05/11/2023 1359 Last data filed at 05/11/2023 0900 Gross per 24 hour  Intake 960 ml  Output 500 ml  Net 460 ml        Physical Exam: Vital Signs Blood pressure 112/73, pulse (!) 114, temperature 97.7 F (36.5 C), temperature source Oral, resp. rate 17, height 5\' 7"  (1.702 m), weight 100 kg, SpO2 93%.   Constitutional: No distress . Vital signs reviewed. Working with PT HEENT: NCAT, EOMI, oral membranes moist Neck: supple Cardiovascular: RRR without murmur. No JVD    Respiratory/Chest: CTA Bilaterally with a few scattered rhonchi. Normal effort   . ON O2 [place] GI/Abdomen: BS +, non-tender, non-distended Ext: no clubbing, cyanosis, 1+ edema R>L LE Psych: pleasant and cooperative  Skin: Bruising noted on bilateral upper extremities, right upper extremity IV looks okay Neuro: Alert and oriented x 4, no apparent cognitive deficits, cranial nerves II through XII grossly intact other than hard of hearing-chronic   MOTOR: RUE: 5/5 Deltoid, 5/5 Biceps, 5/5 Triceps,5/5 Grip LUE: 5/5 Deltoid, 5/5 Biceps, 5/5 Triceps, 5/5 Grip RLE: HF 5/5, KE 5/5, ADF 5/5, APF 5/5 LLE: HF 5/5, KE 5/5, ADF 5/5, APF 5/5     SENSORY: Normal to touch all 4 extremities  although altered in distal lower extremities length dependent distribution Prior neuro assessment is c/w today's exam 05/11/2023.     Assessment/Plan: 1. Functional deficits which require 3+ hours per day of interdisciplinary therapy in a comprehensive inpatient rehab setting. Physiatrist is providing close team supervision and 24 hour management of active medical problems listed below. Physiatrist and rehab team continue to assess barriers to discharge/monitor patient progress toward functional and medical goals  Care Tool:  Bathing    Body parts bathed by patient: Right arm, Left arm, Chest, Abdomen, Front perineal area, Buttocks, Right upper leg, Left upper leg, Right lower leg, Left lower leg         Bathing assist Assist Level: Minimal Assistance - Patient > 75%     Upper Body Dressing/Undressing Upper body dressing   What is the patient wearing?: Pull over shirt    Upper body assist Assist Level: Set up assist    Lower Body Dressing/Undressing Lower body dressing      What is the patient wearing?: Underwear/pull up, Pants     Lower body assist Assist for lower body dressing: Maximal Assistance -  Patient 25 - 49%     Toileting Toileting    Toileting assist Assist for toileting: Minimal Assistance - Patient > 75%     Transfers Chair/bed transfer  Transfers assist     Chair/bed transfer assist level: Supervision/Verbal cueing     Locomotion Ambulation   Ambulation assist   Ambulation activity did not occur: Safety/medical concerns  Assist level: Supervision/Verbal cueing Assistive device: Other (comment) (use of oxygen tank as AD) Max distance: 40   Walk 10 feet activity   Assist  Walk 10 feet activity did not occur: Safety/medical concerns  Assist level: Supervision/Verbal cueing Assistive device: Other (comment) (use of oxygen tank with close supervision and close WC follow)   Walk 50 feet activity   Assist Walk 50 feet with 2 turns  activity did not occur: Safety/medical concerns         Walk 150 feet activity   Assist Walk 150 feet activity did not occur: Safety/medical concerns         Walk 10 feet on uneven surface  activity   Assist Walk 10 feet on uneven surfaces activity did not occur: Safety/medical concerns         Wheelchair     Assist Is the patient using a wheelchair?: Yes Type of Wheelchair: Manual    Wheelchair assist level: Dependent - Patient 0%      Wheelchair 50 feet with 2 turns activity    Assist        Assist Level: Dependent - Patient 0%   Wheelchair 150 feet activity     Assist      Assist Level: Dependent - Patient 0%   Blood pressure 112/73, pulse (!) 114, temperature 97.7 F (36.5 C), temperature source Oral, resp. rate 17, height 5\' 7"  (1.702 m), weight 100 kg, SpO2 93%.  Medical Problem List and Plan: 1. Functional deficits secondary to acute respiratory distress/acute on chronic hypoxia/Pseudomonas pneumonia.  Prednisone taper.  Patient with chronic O2.             -Patient may shower             -ELOS/Goals: 10 to 12 days, PT OT mod I             -Continue CIR therapies including PT, OT   -15/7 Schedule   -Lasix 20mg  daily started- , Cr 1.09 recheck Monday -Hold lasix tomorrow, check labs, consider restating if need -Team conference tomorrow  2.  Antithrombotics: -DVT/anticoagulation:  Pharmaceutical: Xarelto for history of pulmonary emboli             -antiplatelet therapy: N/A 3. Pain Management: Oxycodone as needed             -Hx of chronic lower back pain, PDMP indicates norco 5 BID PRN  - 4/5 pain controlled, not frequently using as needed  -4/8 Schedule hydrocodone 5mg  BID 7pm and 7am 4. Mood/Behavior/Sleep: Provide emotional support             -antipsychotic agents: N/A 5. Neuropsych/cognition: This patient is capable of making decisions on his own behalf. 6. Skin/Wound Care: Routine skin checks 7.  Fluids/Electrolytes/Nutrition: Routine in and outs with follow-up chemistries 8.  History of recurrent non-small cell lung cancer.  Previously received chemotherapy and immunotherapy in 2020 and 2022.  Followed outpatient by pulmonary team as well as oncology services Dr. Si Gaul 9.  History of radiation-induced IPF.  Continue bronchodilators and steroid as directed 10.  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  Followed outpatient by urology services Dr. Cardell Peach.  Continue Flomax             -Reports dark urine at baseline  -4/7-8 continent, reports at baseline 11.  Tingling bilateral feet/neuropathy-chronic             -Potentially paraneoplastic, continue to monitor  12.  Leukocytosis  -4/3 WBC within normal limits today  4/7 resolved 13. Constipation   -4/3 LBM 4/1, patient to try suppository today, continue MiraLAX and Senokot.  Add sorbitol as needed  -4/4 LBM today, large continue to monitor  -LBM 4/6, consider additional medication if NO bm today, denies feeling constipated  14. Lower ext edema:  -4/7 hyperkalemic (5.6) on labs today -ongoing  edema Filed Weights   05/08/23 0530 05/10/23 0500 05/11/23 0659  Weight: 100.2 kg 100 kg 100 kg   -resume lasix 20mg  daily today, check labs tomorrow  -lokelma x 1 today  4/8 Cr. 1.15/BUN 43, continue current regimen. K+ down to 4.1   LOS: 6 days A FACE TO FACE EVALUATION WAS PERFORMED  Fanny Dance 05/11/2023, 1:59 PM

## 2023-05-11 NOTE — Plan of Care (Signed)
  Problem: Consults Goal: RH GENERAL PATIENT EDUCATION Description: See Patient Education module for education specifics. Outcome: Progressing Goal: Skin Care Protocol Initiated - if Braden Score 18 or less Description: If consults are not indicated, leave blank or document N/A Outcome: Progressing Goal: Nutrition Consult-if indicated Outcome: Progressing Goal: Diabetes Guidelines if Diabetic/Glucose > 140 Description: If diabetic or lab glucose is > 140 mg/dl - Initiate Diabetes/Hyperglycemia Guidelines & Document Interventions  Outcome: Progressing   Problem: RH BOWEL ELIMINATION Goal: RH STG MANAGE BOWEL WITH ASSISTANCE Description: STG Manage Bowel Mod I with Assistance with medications Outcome: Progressing   Problem: RH BLADDER ELIMINATION Goal: RH STG MANAGE BLADDER WITH ASSISTANCE Description: STG Manage Bladder With Mod I Assistance Outcome: Progressing   Problem: RH SKIN INTEGRITY Goal: RH STG SKIN FREE OF INFECTION/BREAKDOWN Outcome: Progressing   Problem: RH SAFETY Goal: RH STG ADHERE TO SAFETY PRECAUTIONS W/ASSISTANCE/DEVICE Description: STG Adhere to Safety Precautions With Mod I Assistance/Device. Outcome: Progressing   Problem: RH PAIN MANAGEMENT Goal: RH STG PAIN MANAGED AT OR BELOW PT'S PAIN GOAL Description: <4 w// prns Outcome: Progressing   Problem: RH KNOWLEDGE DEFICIT GENERAL Goal: RH STG INCREASE KNOWLEDGE OF SELF CARE AFTER HOSPITALIZATION Description: Manage increase knowledge of self care after hospitalization using educational materials provided Outcome: Progressing

## 2023-05-11 NOTE — Progress Notes (Signed)
 Physical Therapy Session Note  Patient Details  Name: Philip [name] MRN: [id number] Date of Birth: August 26, 1955  Today's Date: 05/11/2023 PT Individual Time: 0804-0900 PT Individual Time Calculation (min): 56 min   Short Term Goals: Week 1:  PT Short Term Goal 1 (Week 1): pt will tolerate sitting OOB between sessions PT Short Term Goal 2 (Week 1): pt will ambulate 25 feet with LRAD with continuous O2 montioring  Skilled Therapeutic Interventions/Progress Updates:      Pt supine in bed upon arrival. Pt agreeable to therapy. Pt reports 4/10 kidney pain at site of stent. Nurse present during session to administer pain medication.   Respiratory present for respiratory treatment Pt on 10L O2 with SpO2 at 97, pt reports he is on 10L to tolerate eating breakfast. Pt SpO2 dropped to 88 on 10L with taking medications.   Pt on 10L throguhout session HR 113-127 throughout session.   Therapist donned ted hose B.   Supine to sit with use of bed features and supervision, pt SpO2 dropped to 79 with bed mobility, increased to 90 within 2 min with pursed lip breathing and pep flutter valve.   Verbal cues provided for technique with pursed lip breathing "smell the roses, blow out the candles"  Pt performed the following therex seated EOB for B LE strengthening/activity tolerance/endurance: Pt requied prolonged seated rest breaks for hemodynamic stability--SpO2 decreased to as low as 79  with seated therex, and 77 with standing therex-but improved to 88-90% with seated rest break and pursed lip breathing. Therapist required cues to limit talking with SpO2 less than 85 as pt talking negatively impacts his SpO2 level.   Pt reports limited airflow intermittent 2/2 stuffy nose, but reports improved after blowing nose. Of note: scant amounts of blood on tissue. Pt reports not as bad as typical.   1x5 LAQ B 1x10 heel/toe raises B 1x5 seated marching B  Standing heel raises x5 with with L HHA for correction  of posterior LOB (without HHA) --pt desatted to 76  Sit to stand x7 (continuous with no rest break in between) with close supervision -- pt desatted to 79  Pt performed stand pivot transfer bed to recliner with supervision with no AD.   Pt seated in recliner with all needs within reach and chair alarm on.      Therapy Documentation Precautions:  Precautions Precautions: Fall, Other (comment) Recall of Precautions/Restrictions: Intact Precaution/Restrictions Comments: Monitor O2. Goal: 88% or greater on 6L Restrictions Weight Bearing Restrictions Per Provider Order: No   Therapy/Group: Individual Therapy  [facility] Ambrose Finland, [place], DPT  05/11/2023, 7:56 AM

## 2023-05-11 NOTE — Progress Notes (Signed)
 Met with patient to review current situation, team conference and plan of care. Reviewed medications. Patient was very concerned re: 02 and 02 saturation, keeps up with his 02 readings at all times. Continue to follow along to provide educational needs to facilitate preparation for discharge.

## 2023-05-12 ENCOUNTER — Inpatient Hospital Stay (HOSPITAL_COMMUNITY)

## 2023-05-12 DIAGNOSIS — M545 Low back pain, unspecified: Secondary | ICD-10-CM | POA: Diagnosis not present

## 2023-05-12 DIAGNOSIS — J9600 Acute respiratory failure, unspecified whether with hypoxia or hypercapnia: Secondary | ICD-10-CM | POA: Diagnosis not present

## 2023-05-12 DIAGNOSIS — N400 Enlarged prostate without lower urinary tract symptoms: Secondary | ICD-10-CM | POA: Diagnosis not present

## 2023-05-12 DIAGNOSIS — K5900 Constipation, unspecified: Secondary | ICD-10-CM | POA: Diagnosis not present

## 2023-05-12 LAB — URINALYSIS, ROUTINE W REFLEX MICROSCOPIC
Bilirubin Urine: NEGATIVE
Glucose, UA: NEGATIVE mg/dL
Ketones, ur: NEGATIVE mg/dL
Leukocytes,Ua: NEGATIVE
Nitrite: NEGATIVE
Protein, ur: 100 mg/dL — AB
RBC / HPF: >50 RBC/hpf (ref 0–5)
Specific Gravity, Urine: 1.01 (ref 1.005–1.030)
pH: 6 (ref 5.0–8.0)

## 2023-05-12 LAB — GLUCOSE, CAPILLARY: Glucose-Capillary: 142 mg/dL — ABNORMAL HIGH (ref 70–99)

## 2023-05-12 MED ORDER — SORBITOL 70 % SOLN
45.0000 mL | Freq: Once | Status: AC
  Administered 2023-05-12: 45 mL via ORAL
  Filled 2023-05-12: qty 60

## 2023-05-12 MED ORDER — IBUPROFEN 400 MG PO TABS: 400.0000 mg | ORAL_TABLET | Freq: Four times a day (QID) | ORAL | Status: DC | PRN

## 2023-05-12 MED ORDER — POLYETHYLENE GLYCOL 3350 17 G PO PACK: 17.0000 g | PACK | Freq: Every day | ORAL | Status: DC

## 2023-05-12 NOTE — Patient Care Conference (Signed)
 Inpatient RehabilitationTeam Conference and Plan of Care Update Date: 05/12/2023   Time: 1215 pm     Patient Name: Philip [name]      Medical Record Number: [id number]  Date of Birth: 1955/03/29 Sex: Male         Room/Bed: 4M10C/4M10C-01 Payor Info: Payor: [employer] / Plan: [employer] / Product Type: *No Product type* /    Admit Date/Time:  05/05/2023  1:46 PM  Primary Diagnosis:  Acute respiratory failure [facility])  Hospital Problems: Principal Problem:   Acute respiratory failure [facility])    Expected Discharge Date: Expected Discharge Date: 05/20/23  Team Members Present: Physician leading conference: Dr. Fanny Dance Social Worker Present: Dossie Der, LCSW Nurse Present: Konrad Dolores, RN PT Present: Blima Rich, PT OT Present: Candee Furbish, OT     Current Status/Progress Goal Weekly Team Focus  Bowel/Bladder   continent of b/b LBM 4/5.   have regular bowel movements   use PRN laxatives until able to have bowel movements on his own    Swallow/Nutrition/ Hydration               ADL's   SBA overall for ADL and functional transfers using RW   Mod I. Has shower chair although may need TTB. Will assess. Rec HHOT   energy conservation, activity tolerance while completing self care tasks.    Mobility   bed mobility supervision, sit to stand with supervision, gait x40 feet with no AD and close supervision with close WC follow. Step up x1 with no AD and L HHA   mod I/sup  barriers to discharge: pt has 2 steps with no handrials for home entry--recommending ramp, handout provided for how to build a ramp, pt called his friend 4/9; DME: pt has WC, and oxygen tank (uses as AD); barriers to D/C: limited activity tolerance--pt reuqires seated rest break for hemodynamic stability 2/2 SpO2 desatting to as low 74 on 10L with ambulation x30-40 feet--on 4/9 pt desatting to as low as 79 on 10 L while lying in bed and talking.    Communication                Safety/Cognition/  Behavioral Observations               Pain   patient complained of pain in right kidney 4/10   <3/10   assess pain qshift and PRN using PRN meds for breakthrough pain    Skin   skin is intact   maintain skin integrity  assess skin qshift and PRN      Discharge Planning:  Home with wife who is able to provid assist, pt is doing well and needs to work on activity tolerance and strengthening   Team Discussion: Patient was admitted post acute respiratory distress/acute on chronic hypoxia/Pseudomonas pneumonia. Patient on chronic oxygen use. Patient limited by oxygen desaturation  to as low as 74% on 10L on ambulation and while laying on  bed and  talking, anxiety,  pain, activity intolerance , bilateral neuropathy and lower extremity edema.  Patient on target to meet rehab goals: Yes, patient requires stand by assist with all ADLs. Patient transfers with supervision using a rolling walker. Patient ambulates with supervision up to 40 ft with no assistive device and requires close wheelchair follow-up. Overall goals for discharge are set for mod I/supervision.  *See Care Plan and progress notes for long and short-term goals.   Revisions to Treatment Plan:  15/7 therapies   Teaching Needs: Safety, medications,  home oxygen education, transfers, toileting., etc.    Current Barriers to Discharge: Decreased caregiver support  Possible Resolutions to Barriers: Family education  Home health follow -up DME: TTB    Medical Summary Current Status: Respiratory issues, Hypoxia, Renal stent overdue, anxiety, edema, consitpation  Barriers to Discharge: Medical stability;Oxygen Requirement;Self-care education;Uncontrolled Pain  Barriers to Discharge Comments: Respiratory issues, Hypoxia, Renal stent overdue, anxiety, edema, consitpation Possible Resolutions to [employer] Focus: Respiratory education, Lasix, monitor bowel function, UA and culture   Continued Need for Acute  Rehabilitation Level of Care: The patient requires daily medical management by a physician with specialized training in physical medicine and rehabilitation for the following reasons: Direction of a multidisciplinary physical rehabilitation program to maximize functional independence : Yes Medical management of patient stability for increased activity during participation in an intensive rehabilitation regime.: Yes Analysis of laboratory values and/or radiology reports with any subsequent need for medication adjustment and/or medical intervention. : Yes   I attest that I was present, lead the team conference, and concur with the assessment and plan of the team.   Konrad Dolores 05/12/2023, 1215 pm

## 2023-05-12 NOTE — Progress Notes (Addendum)
 PROGRESS NOTE   Subjective/Complaints:  Rapid response due to shortness of breath this AM due to desaturation, Improved with increase O2. Pt thinks he overdid it with activity yesterday. Feels back to baseline now. Continues to have dark urine, reports this has been going on for a month as he is overdue for ureteral  stent.  Had increased edema yesterday, improved with massage and leg elevation this AM.  Potentially having anxiety regarding O2 level.   ROS: Patient denies fever, rash, sore throat, blurred vision, dizziness, nausea, vomiting, diarrhea, cough,   chest pain,  headache, or mood change.  + shortness of breath- Stable + anxiety    Objective:   No results found. Recent Labs    05/10/23 0509  WBC 6.0  HGB 10.0*  HCT 32.9*  PLT 167    Recent Labs    05/10/23 0509 05/11/23 0503  NA 141 138  K 5.6* 4.1  CL 98 97*  CO2 34* 34*  GLUCOSE 102* 104*  BUN 18 18  CREATININE 1.17 1.15  CALCIUM 8.6* 8.6*    Intake/Output Summary (Last 24 hours) at 05/12/2023 1011 Last data filed at 05/12/2023 0250 Gross per 24 hour  Intake 480 ml  Output 300 ml  Net 180 ml        Physical Exam: Vital Signs Blood pressure 107/71, pulse (!) 114, temperature 97.8 F (36.6 C), temperature source Oral, resp. rate (!) 22, height 5\' 7"  (1.702 m), weight 100 kg, SpO2 (!) 83%.   Constitutional: No distress . Vital signs reviewed. Working with therapy in bed HEENT: NCAT, EOMI, oral membranes a little dry Neck: supple Cardiovascular: RRR without murmur. No JVD    Respiratory/Chest: CTA Bilaterally with a few scattered rhonchi. Normal effort   . ON O2 [place] GI/Abdomen: BS +, non-tender, non-distended Ext: no clubbing, cyanosis, 1+ edema R>L LE Psych: pleasant and cooperative  GU: dark urine in urinal Skin: Bruising noted on bilateral upper extremities, right upper extremity IV looks okay Neuro: Alert and oriented x 4, no apparent  cognitive deficits, cranial nerves II through XII grossly intact other than hard of hearing-chronic   MOTOR: RUE: 5/5 Deltoid, 5/5 Biceps, 5/5 Triceps,5/5 Grip LUE: 5/5 Deltoid, 5/5 Biceps, 5/5 Triceps, 5/5 Grip RLE: HF 5/5, KE 5/5, ADF 5/5, APF 5/5 LLE: HF 5/5, KE 5/5, ADF 5/5, APF 5/5     SENSORY: Normal to touch all 4 extremities although altered in distal lower extremities length dependent distribution Prior neuro assessment is c/w today's exam 05/12/2023.     Assessment/Plan: 1. Functional deficits which require 3+ hours per day of interdisciplinary therapy in a comprehensive inpatient rehab setting. Physiatrist is providing close team supervision and 24 hour management of active medical problems listed below. Physiatrist and rehab team continue to assess barriers to discharge/monitor patient progress toward functional and medical goals  Care Tool:  Bathing    Body parts bathed by patient: Right arm, Left arm, Chest, Abdomen, Front perineal area, Buttocks, Right upper leg, Left upper leg, Right lower leg, Left lower leg         Bathing assist Assist Level: Minimal Assistance - Patient > 75%     Upper Body Dressing/Undressing  Upper body dressing   What is the patient wearing?: Pull over shirt    Upper body assist Assist Level: Set up assist    Lower Body Dressing/Undressing Lower body dressing      What is the patient wearing?: Underwear/pull up, Pants     Lower body assist Assist for lower body dressing: Maximal Assistance - Patient 25 - 49%     Toileting Toileting    Toileting assist Assist for toileting: Minimal Assistance - Patient > 75%     Transfers Chair/bed transfer  Transfers assist     Chair/bed transfer assist level: Supervision/Verbal cueing     Locomotion Ambulation   Ambulation assist   Ambulation activity did not occur: Safety/medical concerns  Assist level: Supervision/Verbal cueing Assistive device: Other (comment) (use of  oxygen tank as AD) Max distance: 40   Walk 10 feet activity   Assist  Walk 10 feet activity did not occur: Safety/medical concerns  Assist level: Supervision/Verbal cueing Assistive device: Other (comment) (use of oxygen tank with close supervision and close WC follow)   Walk 50 feet activity   Assist Walk 50 feet with 2 turns activity did not occur: Safety/medical concerns         Walk 150 feet activity   Assist Walk 150 feet activity did not occur: Safety/medical concerns         Walk 10 feet on uneven surface  activity   Assist Walk 10 feet on uneven surfaces activity did not occur: Safety/medical concerns         Wheelchair     Assist Is the patient using a wheelchair?: Yes Type of Wheelchair: Manual    Wheelchair assist level: Dependent - Patient 0%      Wheelchair 50 feet with 2 turns activity    Assist        Assist Level: Dependent - Patient 0%   Wheelchair 150 feet activity     Assist      Assist Level: Dependent - Patient 0%   Blood pressure 107/71, pulse (!) 114, temperature 97.8 F (36.6 C), temperature source Oral, resp. rate (!) 22, height 5\' 7"  (1.702 m), weight 100 kg, SpO2 (!) 83%.  Medical Problem List and Plan: 1. Functional deficits secondary to acute respiratory distress/acute on chronic hypoxia/Pseudomonas pneumonia.  Prednisone taper.  Patient with chronic O2.             -Patient may shower             -ELOS/Goals: 10 to 12 days, PT OT mod I             -Continue CIR therapies including PT, OT   -15/7 Schedule   -Lasix 20mg  daily started- , Cr 1.09 recheck Monday -Hold lasix tomorrow, check labs, consider restating if need -Team conference today please see physician documentation under team conference tab, met with team  to discuss problems,progress, and goals. Formulized individual treatment plan based on medical history, underlying problem and comorbidities.  -CXR stable today  2.   Antithrombotics: -DVT/anticoagulation:  Pharmaceutical: Xarelto for history of pulmonary emboli             -antiplatelet therapy: N/A 3. Pain Management: Oxycodone as needed             -Hx of chronic lower back pain, PDMP indicates norco 5 BID PRN  - 4/5 pain controlled, not frequently using as needed  -4/8 Schedule hydrocodone 5mg  BID 7pm and 7am  -4/9 pt reports improvement with scheduled  medication 4. Mood/Behavior/Sleep: Provide emotional support             -antipsychotic agents: N/A 5. Neuropsych/cognition: This patient is capable of making decisions on his own behalf. 6. Skin/Wound Care: Routine skin checks 7. Fluids/Electrolytes/Nutrition: Routine in and outs with follow-up chemistries 8.  History of recurrent non-small cell lung cancer.  Previously received chemotherapy and immunotherapy in 2020 and 2022.  Followed outpatient by pulmonary team as well as oncology services Dr. Si Gaul 9.  History of radiation-induced IPF.  Continue bronchodilators and steroid as directed 10.  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  Followed outpatient by urology services Dr. Cardell Peach.  Continue Flomax             -Reports dark urine at baseline  -4/7-8 continent, reports at baseline  -4/9 Discussed with urology, will check U/A culture. Dark/blood tinged urine may be related to his overdue for replacement ureteral stent. As pt mentioned likely limited on options for procedural options due to pulmonary status. Could consider pyridimine.  11.  Tingling bilateral feet/neuropathy-chronic             -Potentially paraneoplastic, continue to monitor  12.  Leukocytosis  -4/3 WBC within normal limits today  4/7 resolved 13. Constipation   -4/3 LBM 4/1, patient to try suppository today, continue MiraLAX and Senokot.  Add sorbitol as needed  -4/4 LBM today, large continue to monitor  -LBM 4/6, consider additional medication if NO bm today, denies feeling constipated  -4/9 order  storbitol  14. Lower ext edema:  -4/7 hyperkalemic (5.6) on labs today -ongoing  edema  Filed Weights   05/08/23 0530 05/10/23 0500 05/11/23 0659  Weight: 100.2 kg 100 kg 100 kg   -resume lasix 20mg  daily today, check labs tomorrow  -lokelma x 1 today  4/8 Cr. 1.15/BUN 43, continue current regimen. K+ down to 4.1  -Discussed leg elevated, compression stockings  LOS: 7 days A FACE TO FACE EVALUATION WAS PERFORMED  Fanny Dance 05/12/2023, 10:11 AM

## 2023-05-12 NOTE — Progress Notes (Signed)
 Occupational Therapy Weekly Progress Note  Patient Details  Name: Philip [name] MRN: [id number] Date of Birth: Aug 05, 1955  Beginning of progress report period: May 06, 2023 End of progress report period: May 12, 2023  Today's Date: 05/12/2023 OT Individual Time:  -       Patient has met 2 of 2 short term goals.  Pt making gradual progress towards OT goals with pt completing ADLs and functional mobility with RW at supervision level. Pt continues to be limited by increased work of breathing and dyspnea with minimal exertion with pt needing up to 15 L of HFNC to maintain appropriate sats. At baseline pt typically wears 6-10 L.     Patient continues to demonstrate the following deficits: {impairments:3041632} and therefore will continue to benefit from skilled OT intervention to enhance overall performance with {ADL/iADL:3041649}.  Patient {LTG progression:3041653}.  {plan of OZHY:8657846}  OT Short Term Goals Week 1:  OT Short Term Goal 1 (Week 1): Pt will complete bathing at shower level while seated utilizing compensatory techniques while monitoring SpO2 and exertion level. OT Short Term Goal 1 - Progress (Week 1): Met OT Short Term Goal 2 (Week 1): Pt will completed dressing while seated with SBA while utilizing energy conservation techniques. OT Short Term Goal 2 - Progress (Week 1): Met Week 2:  OT Short Term Goal 1 (Week 2): pt will maintain sats using compensatory strategies as needed during 1 ADL task OT Short Term Goal 2 (Week 2): pt will tolerate standing for 30 secs while maintaining sats using compensatory strategies as needed    Therapy Documentation Precautions:  Precautions Precautions: Fall, Other (comment) Recall of Precautions/Restrictions: Intact Precaution/Restrictions Comments: Monitor O2. Goal: 88% or greater on 6L Restrictions Weight Bearing Restrictions Per Provider Order: No    Therapy/Group: Individual Therapy  Pollyann Glen [facility] 05/12/2023, 9:27 AM

## 2023-05-12 NOTE — Progress Notes (Signed)
 Spoke with Respiratory therapist re: [phone] safety education at home . Per RT she will come and do the education. Nurse made aware.

## 2023-05-12 NOTE — Progress Notes (Signed)
 Occupational Therapy Session Note  Patient Details  Name: Philip [name] MRN: [id number] Date of Birth: 1955-08-07  Today's Date: 05/12/2023 OT Individual Time: 7846-9629 OT Individual Time Calculation (min): 66 min    Short Term Goals: Week 1:  OT Short Term Goal 1 (Week 1): Pt will complete bathing at shower level while seated utilizing compensatory techniques while monitoring SpO2 and exertion level. OT Short Term Goal 1 - Progress (Week 1): Met OT Short Term Goal 2 (Week 1): Pt will completed dressing while seated with SBA while utilizing energy conservation techniques. OT Short Term Goal 2 - Progress (Week 1): Met  Skilled Therapeutic Interventions/Progress Updates:  Pt greeted semi reclined in bed, pt  reports rapid event from this AM but pt agreeable to OT intervention.      Upon arrival pt on 10 L HFNC with spO2 80% hr 116  Prior to exercises increased O2 to 15 L; SpO2 90% HR 109 bpm Pt using flutter valve throughout session as needed to maintain sats.  Issued pt wedge to elevate BLEs to decrease edema     Exercises: pt completed below BUE therex with level 2 theraband to improve global conditioning and endurance: X10 shoulder flexion  X20 bicep curls X10 shoulder horizontal ABD X10 shoulder diagonal pulls  `1 min of  alternating punches  SpO2 did drop as low as 71% on 15 L HFNC hr max 122 bpm   Pt completed ball taps with pt using 2 lb dowel rod to toss beach ball back and forth to challenge global endurance. Pt completed task for 2x1 min and then required 2 mins to increase spo2 to 88% on 15 L HFNC   Able to titrate pt back down to 10 L HFNC at end of session with SpO2 85% HR 112 bpm     Ended session with pt semi reclined in bed with all needs within reach and bed alarm activated.                   Therapy Documentation Precautions:  Precautions Precautions: Fall, Other (comment) Recall of Precautions/Restrictions: Intact Precaution/Restrictions Comments:  Monitor O2. Goal: 88% or greater on 6L Restrictions Weight Bearing Restrictions Per Provider Order: No  Pain: No pain reported during session    Therapy/Group: Individual Therapy  Pollyann Glen [facility] 05/12/2023, 12:07 PM

## 2023-05-12 NOTE — Significant Event (Signed)
 Rapid Response Event Note   Reason for Call :  Called by RT; Desaturation 81% 8L [place] intially  Initial Focused Assessment:  Patient sitting up in bed finishing breakfast, in no apparent distress. Able to talk in complete sentences. Baseline wears 6L at home, with O2 in mid 80s per pt and PA. Lungs clear/diminished. Skin warm and dry, 3+ edema BLE which pt endorses this is normal. He states he has been wearing TED hose when OOB.   107/71 (84) HR 114 RR 22 O2 88-92% 15L [place] CBG 142  Interventions/Plan of Care:  CXR Give AM lasix PA to bedside At this time, pt remains on 10L [place] with O2 low 80s O2 sats ok to remain low 80s per PA as pt without decompensation. May need to adjust O2 sat goal at later time.  Event Summary:  MD Notified: Dan PA Call Time: [phone] Arrival Time: 7829 End Time: 0830  Truddie Crumble, RN

## 2023-05-12 NOTE — Progress Notes (Signed)
 Physical Therapy Session Note  Patient Details  Name: Philip [name] MRN: [id number] Date of Birth: Jan 07, 1956  Today's Date: 05/12/2023 PT Individual Time: 9562-1308, 1320-1415 PT Individual Time Calculation (min): 43 min, 55 min   Short Term Goals: Week 1:  PT Short Term Goal 1 (Week 1): pt will tolerate sitting OOB between sessions PT Short Term Goal 2 (Week 1): pt will ambulate 25 feet with LRAD with continuous O2 montioring  Skilled Therapeutic Interventions/Progress Updates:      Treatment Session 1  Pt supine in bed upon arrival with B LE elevated on wedge. Pt SpO2 at 81 on 10 L with HR at 113. Therapist encouraged use of pep flutter valve and pursed lip breathing to increase SpO2. Pt increased to 90 within 1 min but pt having difficulty sustaining while talking, pt decreasing to as low as 79 while lying and talking. Therapist encouraging pt to limit talking throughout session with use of pursed lip breathing and pep flutter valve. Pt requires max verbal cues for limiting talking for hemodynamic stability.   Pt reports "I have had a bit of an episode, I may have pushed it a little, I did exercises yesterday evening after therapy left."   Pt reports eating dinner with pt wife last night while sitting EOB and having increased swelling in B LE--improved with elevation and B LE massage from wife. Pt reports having dizziness this morning around 3 am. Pt reports rapid response contacted this morning 2/2 SOB.    Pt agreeable to bed level therapy.   Pt performed the following therex for B LE strength/activity tolerance/endurance/contracture prevention and edema management. Pt required frequent rest breaks for hemodynamic stability. Pt SpO2 dropping to as low as 79 with supine therex, increases to 92 with rest break/pursed lip breathing/limited talking/pep flutter valve. Of note: when pt not talking his SpO2 drops to 86 with therex  1x10 ankle pumps 1x5 SLR B 1x5 heel slides B 1x5 single  knee to chest B  1x5 supine hip abduction B 1x5 ankle pump/quad set/glute set combo holding for 5"  Therapist has previously initiated conversation for installing a ramp however pt initially resistant to this idea. Therapist reintroduced recommendation with education on therapist safety concerns with navigating stairs 2/2 hemodynamic stability/fall risk/overall safety. Pt verbalized understanding and agreeable. Handout provided for how to build a ramp. Education provided for alternative option of purchasing a temporary ramp for pt 2 step entrance. Pt called his friend and initiated conversation for his friend building the ramp. The friend mentioned he would be able to have it done within 1 week.   Treatment Session 2   Pt seated EOB upon arrival on 12 L O2. Pt reports oxygen bumped up to 15 L to allow him to eat lunch while seated EOB. SpO2 at 90 and HR at 117.   Pt reports wanting to lay back down 2/2 B LE swelling, therapist offered to donn ted hose, pt refusing and reports wanting to lay down and allow his feet to rest today and trying again tomorrow with EOB/OOB activity. Pt agreeable to bed level activity today.   Pt performed sit to supine with supervision, SpO2 decreased to 79 but increased to 92 within 2 min.   Therapist adjusted to 10L O2 with pt supine.   Pt performed the following therex for B LE strengthening/activity tolerance/ednurance   Pt on 10L SpO2 dropped to as low as 82 but increased to 88 and above with pursed lip breathing, rest break and  use of pep flutter valve and ~30 seconds.  Pt required rest breaks between exercises and between performing on each leg for hemodynamic stability  1x10 ankle pumps  1x6 combined ankle pump, quad set and glute set holding for 5"  1x10 SLR B  1x10 heel slide B  1x10 supine hip abduction  Decreased O2 to 8L; SpO2 decreased to as low as 82, increased to 88 and above within ~1-2 min  Ankle pumps 1x10 1x6 combined ankle pump, quad set  and glute set holding for 5" 1x10 SLR B 1x10 heel slides B 1x10 supine hip abduction B   Pt scooted up in bed for improved comofrt with HOB adjsuted flat, pt SpO2 decreased to ~78, respiratory therapy present to administer nebulizer treatment.   Therapist had extensive conversation with respiratory therapist to ensure PT is following respiratory recommendations. Respiratory therapist recommended trailing venturi 2/2 pt being heavy mouth breather. RT setting it up in pt room, will continue with it next session.   Pt with respiratory therapist at end of session.    Therapy Documentation Precautions:  Precautions Precautions: Fall, Other (comment) Recall of Precautions/Restrictions: Intact Precaution/Restrictions Comments: Monitor O2. Goal: 88% or greater on 6L Restrictions Weight Bearing Restrictions Per Provider Order: No  Therapy/Group: Individual Therapy  [facility] Ambrose Finland, [place], DPT  05/12/2023, 10:48 AM

## 2023-05-12 NOTE — Progress Notes (Signed)
 Patient ID: Philip [name], male   DOB: [birth year], 68 y.o.   MRN: [id number] Met with pt and wife was on speaker phone while worker was in there, to update regarding team conference goals of supervision to mod/I and discharge date 4/17. Both pleased with his physical progress but concerned about this respiratory status, which is begin addressed by MD's and pulmonary. Pt has all needed DME and does want home health follow up. Has no preference just one in network with insurance. Will continue to work on discharge needs.

## 2023-05-13 DIAGNOSIS — N400 Enlarged prostate without lower urinary tract symptoms: Secondary | ICD-10-CM | POA: Diagnosis not present

## 2023-05-13 DIAGNOSIS — K5900 Constipation, unspecified: Secondary | ICD-10-CM | POA: Diagnosis not present

## 2023-05-13 DIAGNOSIS — M545 Low back pain, unspecified: Secondary | ICD-10-CM | POA: Diagnosis not present

## 2023-05-13 DIAGNOSIS — I2781 Cor pulmonale (chronic): Secondary | ICD-10-CM

## 2023-05-13 DIAGNOSIS — J9600 Acute respiratory failure, unspecified whether with hypoxia or hypercapnia: Secondary | ICD-10-CM | POA: Diagnosis not present

## 2023-05-13 LAB — BRAIN NATRIURETIC PEPTIDE: B Natriuretic Peptide: 200.8 pg/mL — ABNORMAL HIGH (ref 0.0–100.0)

## 2023-05-13 LAB — URINE CULTURE: Culture: <10000 — AB

## 2023-05-13 MED ORDER — FUROSEMIDE 10 MG/ML IJ SOLN
60.0000 mg | Freq: Three times a day (TID) | INTRAMUSCULAR | Status: AC
  Administered 2023-05-13 – 2023-05-14 (×2): 60 mg via INTRAVENOUS
  Filled 2023-05-13 (×4): qty 6

## 2023-05-13 MED ORDER — ALBUMIN HUMAN 25 % IV SOLN
25.0000 g | Freq: Four times a day (QID) | INTRAVENOUS | Status: DC
  Administered 2023-05-13: 25 g via INTRAVENOUS
  Filled 2023-05-13 (×4): qty 100

## 2023-05-13 NOTE — Progress Notes (Addendum)
 Physical Therapy Weekly Progress Note  Patient Details  Name: Philip [name] MRN: [id number] Date of Birth: [birth year]  Beginning of progress report period: May 06, 2023 End of progress report period: May 13, 2023   Patient has partly met 2 of 2 short term goals.  Pt overall activity tolerance is limited 2/2 respiratory status. Pt on 8-10 L throughout sessions. Pt performing bed mobility,sit to stand and stand pivot transfer and gait x40 feet with no AD and close supervision with close WC follow. Pt SpO2 drops as low as 74 with activity--pt requires frequent rest seated breaks for 30 seconds-3 min for recovery to 88 and above. D/C scheduled 4/17. Recommend ramp installation for home entry. Pt friend plans to have ramp build prior to D/C.   Patient continues to demonstrate the following deficits muscle weakness, decreased cardiorespiratoy endurance and decreased oxygen support, and decreased standing balance and decreased balance strategies and therefore will continue to benefit from skilled PT intervention to increase functional independence with mobility.  Patient progressing toward long term goals..  Continue plan of care.  PT Short Term Goals Week 1:  PT Short Term Goal 1 (Week 1): pt will tolerate sitting OOB between sessions PT Short Term Goal 1 - Progress (Week 1): Partly met PT Short Term Goal 2 (Week 1): pt will ambulate 25 feet with LRAD with continuous O2 montioring PT Short Term Goal 2 - Progress (Week 1): Partly met Week 2:  PT Short Term Goal 1 (Week 2): STG=LTG 2/2 ELOS   Therapy Documentation Precautions:  Precautions Precautions: Fall, Other (comment) Recall of Precautions/Restrictions: Intact Precaution/Restrictions Comments: Monitor O2. Goal: 88% or greater on 6L Restrictions Weight Bearing Restrictions Per Provider Order: No  Therapy/Group: Individual Therapy  [facility] Ambrose Finland, [place], DPT  05/13/2023, 9:35 AM

## 2023-05-13 NOTE — Progress Notes (Signed)
 Occupational Therapy Session Note  Patient Details  Name: Philip [name] MRN: [id number] Date of Birth: Sep 24, 1955  Today's Date: 05/13/2023 OT Individual Time: 1430-1530 OT Individual Time Calculation (min): 60 min    Short Term Goals: Week 2:  OT Short Term Goal 1 (Week 2): pt will maintain sats using compensatory strategies as needed during 1 ADL task OT Short Term Goal 2 (Week 2): pt will tolerate standing for 30 secs while maintaining sats using compensatory strategies as needed  Skilled Therapeutic Interventions/Progress Updates:     Pt received sitting up in recliner presenting to be in good spirits receptive to skilled OT session reporting 0/10 pain- OT offering intermittent rest breaks, repositioning, and therapeutic support to optimize participation in therapy session. Pt maintained on 10L O2 via nasal canula throughout session. Pt SpO2 90, HR 114 upon OT arrival. Focused session on providing therapeutic support and therapeutic exercise to increase Pt's overall activity tolerance.   Spent large portion of time at beginning of session building rapport with Pt. Pt confiding in therapist discussing psychosocial impact of his medical status and stating "being on this much oxygen makes me feel like a dog on a leash". Provided maximal therapeutic support and encouragement with noted improvement in moral and engaged Pt in light hearted conversation to support moral and participation throughout session.   Pt able to complete the following exercises from recliner with increased time for rest breaks provided between each set to allow for HR and SpO2 to stabilize. Pt's HR 110-125 SpO2 80-91% during exercises. Pt's SpO2 did drop to 78% following sit <> stand, however recovered to >84% with PLB'ing in <30 sec.   -2x10 seated marched alternating  -2x10 heel/toe raises  -2x10 LAQ alternating R/L LE  -3x2 sit to stands  -2x5 seated hip external rotation  -2x5 seated step taps  Pt was left  resting in recliner with call bell in reach, chair alarm on, and all needs met. HR 110 SpO2 90 10 L O2 via nasal canula.   Therapy Documentation Precautions:  Precautions Precautions: Fall, Other (comment) Recall of Precautions/Restrictions: Intact Precaution/Restrictions Comments: Monitor O2. Goal: 88% or greater on 6L Restrictions Weight Bearing Restrictions Per Provider Order: No   Therapy/Group: Individual Therapy  Clide Deutscher 05/13/2023, 7:57 AM

## 2023-05-13 NOTE — Progress Notes (Signed)
 PROGRESS NOTE   Subjective/Complaints:  Pt had O2 drop at night when [place] was not positioned correctly. + BM yesterday, feels better.  ROS: Patient denies fever, rash, sore throat, blurred vision, dizziness, nausea, vomiting, diarrhea, cough,   chest pain,  headache, or mood change.  + shortness of breath- continued + anxiety  Constipation- improved   Objective:   DG Chest Port 1 View Result Date: 05/12/2023 CLINICAL DATA:  Acute respiratory distress EXAM: PORTABLE CHEST 1 VIEW COMPARISON:  04/18/2023 FINDINGS: Elevation of the right hemidiaphragm. Heart and mediastinal contours are stable. Areas of linear scarring/fibrosis throughout the lungs. Small right pleural effusion is stable. No acute bony abnormality. IMPRESSION: Small right pleural effusion. Chronic changes throughout the lungs. No significant change since prior study. Electronically Signed   By: Charlett Nose M.D.   On: 05/12/2023 10:31   No results for input(s): "WBC", "HGB", "HCT", "PLT" in the last 72 hours.   Recent Labs    05/11/23 0503  NA 138  K 4.1  CL 97*  CO2 34*  GLUCOSE 104*  BUN 18  CREATININE 1.15  CALCIUM 8.6*    Intake/Output Summary (Last 24 hours) at 05/13/2023 1306 Last data filed at 05/13/2023 1151 Gross per 24 hour  Intake 315 ml  Output 850 ml  Net -535 ml        Physical Exam: Vital Signs Blood pressure 103/74, pulse (!) 109, temperature 98.3 F (36.8 C), resp. rate (!) 22, height 5\' 7"  (1.702 m), weight 99.8 kg, SpO2 94%.   Constitutional: No distress . Vital signs reviewed.  Getting breathing treatment  HEENT: NCAT, EOMI, oral membranes moist  Neck: supple Cardiovascular: RRR without murmur. No JVD    Respiratory/Chest: CTA Bilaterally with a few scattered rhonchi. Normal effort   . Breathing treatment on 10L O2 GI/Abdomen: BS +, non-tender, non-distended Ext: no clubbing, cyanosis, 1+ edema R>L LE Psych: pleasant and  cooperative  GU: dark urine in urinal Skin: Bruising noted on bilateral upper extremities, right upper extremity IV looks okay Neuro: Alert and oriented x 4, no apparent cognitive deficits, cranial nerves II through XII grossly intact other than hard of hearing-chronic   MOTOR: RUE: 5/5 Deltoid, 5/5 Biceps, 5/5 Triceps,5/5 Grip LUE: 5/5 Deltoid, 5/5 Biceps, 5/5 Triceps, 5/5 Grip RLE: HF 5/5, KE 5/5, ADF 5/5, APF 5/5 LLE: HF 5/5, KE 5/5, ADF 5/5, APF 5/5     SENSORY: Normal to touch all 4 extremities although altered in distal lower extremities length dependent distribution Prior neuro assessment is c/w today's exam 05/13/2023.     Assessment/Plan: 1. Functional deficits which require 3+ hours per day of interdisciplinary therapy in a comprehensive inpatient rehab setting. Physiatrist is providing close team supervision and 24 hour management of active medical problems listed below. Physiatrist and rehab team continue to assess barriers to discharge/monitor patient progress toward functional and medical goals  Care Tool:  Bathing    Body parts bathed by patient: Right arm, Left arm, Chest, Abdomen, Front perineal area, Buttocks, Right upper leg, Left upper leg, Right lower leg, Left lower leg         Bathing assist Assist Level: Minimal Assistance - Patient > 75%  Upper Body Dressing/Undressing Upper body dressing   What is the patient wearing?: Pull over shirt    Upper body assist Assist Level: Set up assist    Lower Body Dressing/Undressing Lower body dressing      What is the patient wearing?: Underwear/pull up, Pants     Lower body assist Assist for lower body dressing: Maximal Assistance - Patient 25 - 49%     Toileting Toileting    Toileting assist Assist for toileting: Minimal Assistance - Patient > 75%     Transfers Chair/bed transfer  Transfers assist     Chair/bed transfer assist level: Supervision/Verbal cueing      Locomotion Ambulation   Ambulation assist   Ambulation activity did not occur: Safety/medical concerns  Assist level: Supervision/Verbal cueing Assistive device: Other (comment) (use of oxygen tank as AD) Max distance: 40   Walk 10 feet activity   Assist  Walk 10 feet activity did not occur: Safety/medical concerns  Assist level: Supervision/Verbal cueing Assistive device: Other (comment) (use of oxygen tank with close supervision and close WC follow)   Walk 50 feet activity   Assist Walk 50 feet with 2 turns activity did not occur: Safety/medical concerns         Walk 150 feet activity   Assist Walk 150 feet activity did not occur: Safety/medical concerns         Walk 10 feet on uneven surface  activity   Assist Walk 10 feet on uneven surfaces activity did not occur: Safety/medical concerns         Wheelchair     Assist Is the patient using a wheelchair?: Yes Type of Wheelchair: Manual    Wheelchair assist level: Dependent - Patient 0%      Wheelchair 50 feet with 2 turns activity    Assist        Assist Level: Dependent - Patient 0%   Wheelchair 150 feet activity     Assist      Assist Level: Dependent - Patient 0%   Blood pressure 103/74, pulse (!) 109, temperature 98.3 F (36.8 C), resp. rate (!) 22, height 5\' 7"  (1.702 m), weight 99.8 kg, SpO2 94%.  Medical Problem List and Plan: 1. Functional deficits secondary to acute respiratory distress/acute on chronic hypoxia/Pseudomonas pneumonia.  Prednisone taper.  Patient with chronic O2.             -Patient may shower             -ELOS/Goals: 10 to 12 days, PT OT mod I             -Continue CIR therapies including PT, OT   -15/7 Schedule   -Lasix 20mg  daily started- , Cr 1.09 recheck Monday -Hold lasix tomorrow, check labs, consider restating if need -Exp DC 4/17 -CXR stable 05/12/23 -Will consult pulmonology  due to high o2 requirements, desaturations with  exertion  2.  Antithrombotics: -DVT/anticoagulation:  Pharmaceutical: Xarelto for history of pulmonary emboli             -antiplatelet therapy: N/A 3. Pain Management: Oxycodone as needed             -Hx of chronic lower back pain, PDMP indicates norco 5 BID PRN  - 4/5 pain controlled, not frequently using as needed  -4/8 Schedule hydrocodone 5mg  BID 7pm and 7am  -4/9 pt reports improvement with scheduled medication  -4/10 pain controlled continue current 4. Mood/Behavior/Sleep: Provide emotional support             -  antipsychotic agents: N/A 5. Neuropsych/cognition: This patient is capable of making decisions on his own behalf. 6. Skin/Wound Care: Routine skin checks 7. Fluids/Electrolytes/Nutrition: Routine in and outs with follow-up chemistries 8.  History of recurrent non-small cell lung cancer.  Previously received chemotherapy and immunotherapy in 2020 and 2022.  Followed outpatient by pulmonary team as well as oncology services Dr. Si Gaul 9.  History of radiation-induced IPF.  Continue bronchodilators and steroid as directed 10.  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  Followed outpatient by urology services Dr. Cardell Peach.  Continue Flomax             -Reports dark urine at baseline  -4/7-8 continent, reports at baseline  -4/9 Discussed with urology, will check U/A culture. Dark/blood tinged urine may be related to his overdue for replacement ureteral stent. As pt mentioned likely limited on options for procedural options due to pulmonary status. Could consider pyridimine.   -4/10 UA with bacteria, WBC 10-20, but no nitrites, he does have burning on urination but it sounds like this is more subacute/chronic.  Will continue to monitor cultures for now 11.  Tingling bilateral feet/neuropathy-chronic             -Potentially paraneoplastic, continue to monitor  12.  Leukocytosis  -4/3 WBC within normal limits today  4/7 resolved 13. Constipation   -4/3 LBM 4/1,  patient to try suppository today, continue MiraLAX and Senokot.  Add sorbitol as needed  -4/4 LBM today, large continue to monitor  -LBM 4/6, consider additional medication if NO bm today, denies feeling constipated  -4/9 order storbitol   4/10 LBM yesterday, Continue current regimen, discussed sorbitol PRN  14. Lower ext edema:  -4/7 hyperkalemic (5.6) on labs today -ongoing  edema  Filed Weights   05/10/23 0500 05/11/23 0659 05/13/23 0505  Weight: 100 kg 100 kg 99.8 kg   -resume lasix 20mg  daily today, check labs tomorrow  -lokelma x 1 today  4/8 Cr. 1.15/BUN 43, continue current regimen. K+ down to 4.1  -Discussed leg elevated, compression stockings  LOS: 8 days A FACE TO FACE EVALUATION WAS PERFORMED  Fanny Dance 05/13/2023, 1:06 PM

## 2023-05-13 NOTE — Progress Notes (Signed)
 Physical Therapy Session Note  Patient Details  Name: Philip [name] MRN: [id number] Date of Birth: 1955/11/03  Today's Date: 05/13/2023 PT Individual Time: 0900-1013, 1300-1330 PT Individual Time Calculation (min): 73 min, 30 min   Short Term Goals: Week 1:  PT Short Term Goal 1 (Week 1): pt will tolerate sitting OOB between sessions PT Short Term Goal 2 (Week 1): pt will ambulate 25 feet with LRAD with continuous O2 montioring  Skilled Therapeutic Interventions/Progress Updates:      Treatment Session 1  Pt supine in bed upon arrival. Pt agreeable to therapy. Pt denies any pain.   Pt SpO2 88-90, HR 110 on nasal canula with O2 at 10L. Pt decreased to 79 while seated EOB, as well as therex seated EOB-recovered to 88 within 3 min with use of pep flutter valve and pursed lip breathing.   Pt overall requiring increased cues to attention to task, but pt reports feeling fine throughout session "just over thinking things, and distracted which is normal for me"  Pt on nasal cannula, and reports using venturi mask intermittently for recovery last night 2/2 desatting to 62 in the middle of the night. Discussed with nurse, and MD recommendation from respiratory for venturi mask yesterday 2/2 to pt being chronic mouth breather. Requested MD follow up with respiratory regarding recommendation for venturi mask versus nasal cannula for hemodynamic stability and consistency of care.   Therapist donned ted hose B while seated EOB for B LE edema management. Therapist requesting SCD. Notified MD and nurse.   Pt performed the following therex while seated EOB for B LE strengthening/activity tolerance/endurance:   1x5 LAQ B  1x10 heel/toe raises  3x2 sit to stand with close supervision     Pt provided wet wash cloth and mouth wash while seated EOB to wash face, and rinse mouth for pt comfort with set up assist.   Pt performed stand pivot transfer with close supervision bed to recliner. Pt SpO2  decreased to 75, therapist utilized venturi over nasal cannula intermittently for recovery upon sititng in recliner-pt SpO2 increased to 92 wit use of venturi.   Pt seated in recliner at end of session on 10L of O2 only with SpO2 at 88 and HR 107.   Notified nurse of pt position and vitals throughout session, recommended nurse check on him regaularly.    Treatment Session 2  Pt seated in recliner upon arrival. Pt agreeable to therapy. Pt denies any pain.   Pt on 10L nasal cannula, SpO2 90, HR 109  Pt reports he has been feeling good overall since AM session.   Pt erpforemd sit to stand x3, SpO2 decreased to 80 but improved to 88 with seated rest break and pursed lip breathing.   MD informed PT via secure chat okay to trial venturi mask during therapy, but use nasal cannula otherwise.   Pt trialed venturi only without oxygen cannula and pt desatted to 80 and unable to recover with venturi only, but recovered with use of 10L nasal cannula and venturi to 95%. Pt reports at home he uses the mask on top of the cannula with mobility for recovery and then maintains 88-92 with use of nasal cannula. Trailed 2nd attempt with nurse present to ensure proper hook up and same outcome.   Notified MD of outcome via secure chat. This therapist plans to continue with nasal cannula at 10 L and use venturi (in addition to nasal cannula) intermittently as needed if pt desats and needs further intervention  for recovery.   Pt seated in recliner with all needs within each and nurse in room at end of session.       Therapy Documentation Precautions:  Precautions Precautions: Fall, Other (comment) Recall of Precautions/Restrictions: Intact Precaution/Restrictions Comments: Monitor O2. Goal: 88% or greater on 6L Restrictions Weight Bearing Restrictions Per Provider Order: No  Therapy/Group: Individual Therapy  [facility] [place], [place], DPT  05/13/2023, 9:09 AM

## 2023-05-13 NOTE — Consult Note (Signed)
 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   Has had incr O2 requirements in rehab. Sounds like he desats with exertion, is slow to recoup.  There are concerns regarding home O2 options.  PCCM is consulted in this setting   Cough is gone Breathing is troublesome even with coughing Improved using flutter?  Admits to 20 lb (!!) weight gain in past week and increased leg swelling  Pertinent  Medical History  Chronic hypoxic resp failure IPF Lung cancer Bronchiectasis COPD GERD MAI  PE  Chronic AC (xarelto)   Significant Hospital Events: Including procedures, antibiotic start and stop dates in addition to other pertinent events     Interim History / Subjective:  consult  Objective   Blood pressure 127/86, pulse (!) 116, temperature 97.9 F (36.6 C), resp. rate 20, height 5\' 7"  (1.702 m), weight 99.8 kg, SpO2 (!) 88%.        Intake/Output Summary (Last 24 hours) at 05/13/2023 1550 Last data filed at 05/13/2023 1300 Gross per 24 hour  Intake 315 ml  Output 600 ml  Net -285 ml   Filed Weights   05/10/23 0500 05/11/23 0659 05/13/23 0505  Weight: 100 kg 100 kg 99.8 kg    Examination: No distress Cyanotic Marked edema +crackles Tachypneic Loud P2  Imaging/labs reviewed  Resolved Hospital Problem list   N/A  Assessment & Plan:   Acute on chronic cor pulmonale- has all the hallmarks of RV failure and his echo shows it also although read as normal.  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  CT also suggestive of tracheobronchomalacia.  Cannot r/o group IV but already on AC. - Push diuresis - Unna boots - at bedtime BIPAP - DC steroids, continue nebs - Will follow to make sure heading in right direction - Honestly if does not respond to diuresis may benefit from PICC and trial of inotropes  Best Practice (right click and "Reselect all SmartList Selections" daily)  Per primary  Labs   CBC: Recent Labs  Lab 05/10/23 0509  WBC 6.0  HGB 10.0*  HCT 32.9*  MCV 95.9  PLT 167    Basic Metabolic Panel: Recent Labs  Lab 05/08/23 0847 05/10/23 0509 05/11/23 0503  NA 141 141 138  K 4.2 5.6* 4.1  CL 96* 98 97*  CO2 33* 34* 34*  GLUCOSE 116* 102* 104*  BUN 21 18 18   CREATININE 1.09 1.17 1.15  CALCIUM 9.1 8.6* 8.6*   GFR: Estimated Creatinine Clearance: 70.2 mL/min (by C-G formula based on SCr of 1.15 mg/dL). Recent Labs  Lab 05/10/23 0509  WBC 6.0    Liver Function Tests: No results for input(s): "AST", "ALT", "ALKPHOS", "BILITOT", "PROT", "ALBUMIN" in the last 168 hours. No results for input(s): "LIPASE", "AMYLASE" in the last 168 hours. No results for input(s): "AMMONIA" in the last 168 hours.  ABG    Component Value Date/Time   HCO3 27.6 01/12/2022 0945   O2SAT 72.9 01/12/2022 0945     Coagulation Profile: No results for input(s): "INR", "  PROTIME" in the last 168 hours.  Cardiac Enzymes: No results for input(s): "CKTOTAL", "CKMB", "CKMBINDEX", "TROPONINI" in the last 168 hours.  HbA1C: No results found for: "HGBA1C"  CBG: Recent Labs  Lab 05/12/23 0759  GLUCAP 142*    Review of Systems:    Positive Symptoms in bold:  Constitutional fevers, chills, weight loss, fatigue, anorexia, malaise  Eyes decreased vision, double vision, eye irritation  Ears, Nose, Mouth, Throat sore throat, trouble swallowing, sinus congestion  Cardiovascular chest pain, paroxysmal nocturnal dyspnea, lower ext edema, palpitations   Respiratory  SOB, cough, DOE, hemoptysis, wheezing  Gastrointestinal nausea, vomiting, diarrhea  Genitourinary burning with urination, trouble urinating  Musculoskeletal joint aches, joint swelling, back pain  Integumentary  rashes, skin lesions  Neurological focal weakness, focal numbness, trouble speaking, headaches  Psychiatric depression, anxiety, confusion  Endocrine polyuria, polydipsia, cold intolerance, heat intolerance  Hematologic abnormal bruising, abnormal bleeding, unexplained nose bleeds  Allergic/Immunologic recurrent infections, hives, swollen lymph nodes     Past Medical History:  He,  has a past medical history of Chronic kidney disease, COPD (chronic obstructive pulmonary disease) (HCC), Dyspnea, GERD (gastroesophageal reflux disease), Headache, Hyperlipidemia, lung ca (dx'd 12/2018), On home oxygen therapy, Pneumonia, Pulmonary embolus (HCC), and Tobacco abuse.   Surgical History:   Past Surgical History:  Procedure Laterality Date   COLONOSCOPY     CYSTOSCOPY W/ URETERAL STENT PLACEMENT Right 10/24/2021   Procedure: CYSTOSCOPY WITH RETROGRADE PYELOGRAM/URETERAL STENT PLACEMENT;  Surgeon: Jannifer Hick, MD;  Location: [facility];  Service: Urology;  Laterality: Right;   CYSTOSCOPY WITH RETROGRADE PYELOGRAM, URETEROSCOPY AND STENT PLACEMENT Right 10/12/2022   Procedure: CYSTOSCOPY WITH RIGHT  RETROGRADE PYELOGRAM, RIGHT STENT EXCHANGE;  Surgeon: Jannifer Hick, MD;  Location: [facility];  Service: Urology;  Laterality: Right;   HAND SURGERY     VASECTOMY     VIDEO BRONCHOSCOPY WITH ENDOBRONCHIAL ULTRASOUND N/A 12/22/2018   Procedure: VIDEO BRONCHOSCOPY WITH ENDOBRONCHIAL ULTRASOUND;  Surgeon: Josephine Igo, DO;  Location: [facility];  Service: Thoracic;  Laterality: N/A;   VIDEO BRONCHOSCOPY WITH ENDOBRONCHIAL ULTRASOUND N/A 01/09/2019   Procedure: VIDEO BRONCHOSCOPY WITH ENDOBRONCHIAL ULTRASOUND WITH FLUORO;  Surgeon: Josephine Igo, DO;  Location: [facility];  Service: Thoracic;  Laterality:  N/A;   VIDEO BRONCHOSCOPY WITH RADIAL ENDOBRONCHIAL ULTRASOUND N/A 12/22/2018   Procedure: RADIAL ENDOBRONCHIAL ULTRASOUND;  Surgeon: Josephine Igo, DO;  Location: [facility];  Service: Thoracic;  Laterality: N/A;     Social History:   reports that he quit smoking about 17 months ago. His smoking use included cigarettes. He started smoking about 52 years ago. He has a 102.8 pack-year smoking history. He has quit using smokeless tobacco.  His smokeless tobacco use included chew. He reports current alcohol use. He reports that he does not currently use drugs.   Family History:  His family history includes Cancer in his father; Stomach cancer in his father.   Allergies Allergies  Allergen Reactions   Carboplatin Anaphylaxis, Shortness Of Breath, Cough and Hypertension    On dose 8. Became short of breath. Symptom management was called. Received benadryl, solumedrol, Pepcid and fluids. Medication discontinued.    Klonopin [Clonazepam] Other (See Comments)    nervous   Norco [Hydrocodone-Acetaminophen] Other (See Comments)    Made pt feel jittery      Home Medications  Prior to Admission medications   Medication Sig Start Date End Date Taking? Authorizing Provider  albuterol (VENTOLIN HFA) 108 (90 Base) MCG/ACT inhaler Inhale 2 puffs into the  lungs every 4 (four) hours as needed for wheezing or shortness of breath. 01/05/22   Hunsucker, Lesia Sago, MD  bisacodyl (DULCOLAX) 10 MG suppository Place 1 suppository (10 mg total) rectally daily as needed for moderate constipation. 05/03/23   Amin, Ankit C, MD  BREZTRI AEROSPHERE 160-9-4.8 MCG/ACT AERO INHALE 2 PUFFS INTO THE LUNGS IN THE MORNING AND AT BEDTIME. 09/18/22   Hunsucker, Lesia Sago, MD  dextromethorphan-guaiFENesin [facility] DM) 30-600 MG 12hr tablet Take 1 tablet by mouth 2 (two) times daily as needed for cough. 03/17/20   Rhetta Mura, MD  furosemide (LASIX) 20 MG tablet Take 1 tablet (20 mg total) by mouth daily as needed. Patient  taking differently: Take 20 mg by mouth in the morning. 03/12/23   Hunsucker, Lesia Sago, MD  HYDROcodone-acetaminophen (NORCO/VICODIN) 5-325 MG tablet Take 1 tablet by mouth every 12 (twelve) hours as needed for moderate pain (pain score 4-6). 04/05/23   Pickenpack-Cousar, Arty Baumgartner, NP  ibuprofen (ADVIL) 400 MG tablet Take 1 tablet (400 mg total) by mouth every 6 (six) hours as needed for mild pain (pain score 1-3) (or Fever >/= 101). 05/12/23   Angiulli, Mcarthur Rossetti, [name]  morphine 20 MG/5ML solution Take 0.6 mLs (2.4 mg total) by mouth every 6 (six) hours as needed (for dyspnea and cough). **Do not take with Hydrocodone/Acetaminophen** 04/26/23   Danford, Earl Lites, MD  Multiple Vitamins-Minerals (MULTI ADULT GUMMIES) CHEW Chew 1 tablet by mouth daily. 09/02/21   [provider]  naphazoline-pheniramine (VISINE) 0.025-0.3 % ophthalmic solution Place 1 drop into both eyes daily as needed for eye irritation.    [provider]  polyethylene glycol (MIRALAX / GLYCOLAX) 17 g packet Take 17 g by mouth daily. 05/12/23   Angiulli, Mcarthur Rossetti, [name]  predniSONE (DELTASONE) 10 MG tablet Take 3 tablets (30 mg total) by mouth daily with breakfast for 4 days, THEN 2 tablets (20 mg total) daily with breakfast for 4 days, THEN 1 tablet (10 mg total) daily with breakfast for 4 days. 05/03/23 05/15/23  Amin, Ankit C, MD  senna-docusate (SENOKOT-S) 8.6-50 MG tablet Take 1 tablet by mouth at bedtime as needed for moderate constipation. 05/03/23   Amin, Ankit C, MD  tamsulosin (FLOMAX) 0.4 MG CAPS capsule Take 0.4 mg by mouth at bedtime. 10/31/21   [provider]  XARELTO 20 MG TABS tablet TAKE 1 TABLET BY MOUTH DAILY WITH SUPPER. 06/17/22   Hunsucker, Lesia Sago, MD     Critical care time: N/A

## 2023-05-14 DIAGNOSIS — J701 Chronic and other pulmonary manifestations due to radiation: Secondary | ICD-10-CM

## 2023-05-14 DIAGNOSIS — J449 Chronic obstructive pulmonary disease, unspecified: Secondary | ICD-10-CM

## 2023-05-14 DIAGNOSIS — J9621 Acute and chronic respiratory failure with hypoxia: Principal | ICD-10-CM

## 2023-05-14 LAB — BASIC METABOLIC PANEL WITH GFR
Anion gap: 9 (ref 5–15)
BUN: 16 mg/dL (ref 8–23)
CO2: 35 mmol/L — ABNORMAL HIGH (ref 22–32)
Calcium: 8.7 mg/dL — ABNORMAL LOW (ref 8.9–10.3)
Chloride: 93 mmol/L — ABNORMAL LOW (ref 98–111)
Creatinine, Ser: 1.11 mg/dL (ref 0.61–1.24)
GFR, Estimated: >60 mL/min (ref >60)
Glucose, Bld: 110 mg/dL — ABNORMAL HIGH (ref 70–99)
Potassium: 3.3 mmol/L — ABNORMAL LOW (ref 3.5–5.1)
Sodium: 137 mmol/L (ref 135–145)

## 2023-05-14 LAB — MAGNESIUM: Magnesium: 1.8 mg/dL (ref 1.7–2.4)

## 2023-05-14 MED ORDER — ALBUMIN HUMAN 25 % IV SOLN
25.0000 g | Freq: Four times a day (QID) | INTRAVENOUS | Status: AC
  Administered 2023-05-14 (×2): 25 g via INTRAVENOUS
  Filled 2023-05-14 (×2): qty 100

## 2023-05-14 MED ORDER — FUROSEMIDE 10 MG/ML IJ SOLN
60.0000 mg | Freq: Four times a day (QID) | INTRAMUSCULAR | Status: AC
  Administered 2023-05-14 (×2): 60 mg via INTRAVENOUS
  Filled 2023-05-14 (×2): qty 6

## 2023-05-14 MED ORDER — POTASSIUM CHLORIDE CRYS ER 20 MEQ PO TBCR
40.0000 meq | EXTENDED_RELEASE_TABLET | ORAL | Status: AC
  Administered 2023-05-14 (×3): 40 meq via ORAL
  Filled 2023-05-14 (×3): qty 2

## 2023-05-14 NOTE — Progress Notes (Signed)
   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   Has had incr O2 requirements in rehab, desats with exertion, is slow to recoup.  There are concerns regarding home O2 options.  PCCM is consulted in this setting  Admits to 20 lb (!!) weight gain in past week and increased leg swelling  Pertinent  Medical History  Chronic hypoxic resp failure IPF Lung cancer Bronchiectasis COPD GERD MAI  PE  Chronic AC (xarelto)   Significant Hospital Events: Including procedures, antibiotic start and stop dates in addition to other pertinent events     Interim History / Subjective:    Objective   Blood pressure 98/78, pulse (!) 102, temperature 98.6 F (37 C), resp. rate 18, height 5\' 7"  (1.702 m), weight 93.4 kg, SpO2 96%.    Pressure Support:  [6 cmH20-10 cmH20] 10 cmH20   Intake/Output Summary (Last 24 hours) at 05/14/2023 1100 Last data filed at 05/14/2023 0754 Gross per 24 hour  Intake 580 ml  Output 3850 ml  Net -3270 ml   Filed Weights   05/11/23 0659 05/13/23 0505 05/14/23 0500  Weight: 100 kg 99.8 kg 93.4 kg    Examination: General 68 year old male patient sitting up in chair he is in no distress speaking in full sentences on 10 L high flow salter HEENT normocephalic atraumatic no jugular venous distention is appreciated Pulmonary occasional wheeze bibasilar crackles no accessory use Cardiac regular rate and rhythm Abdomen soft nontender Extremities are warm he has Unna boots on both legs Neuro awake oriented and  without focal deficits GU voiding  Resolved Hospital Problem list   N/A  Assessment & Plan:   Acute on chronic cor pulmonale- has all the hallmarks of RV failure and his echo shows it also although read as normal.  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  CT also suggestive of tracheobronchomalacia.  Cannot r/o group IV but already on AC. -2.3 liters yesterday, BUN/cr OK Plan I would continue to push IV diuresis as long as his BUN/creatinine and blood pressure will tolerate this. Continue Unna boots Continue at bedtime BiPAP Continue scheduled bronchodilators We will see if we can get him an Oxymizer, he needs up titration of supplemental oxygen for exertion even at baseline We will hold off on transfer to ICU for PICC/inotrope unless we get to a point where we start to see organ dysfunction  Best Practice (right click and "Reselect all SmartList Selections" daily)  Per primary

## 2023-05-14 NOTE — Progress Notes (Signed)
 Physical Therapy Session Note  Patient Details  Name: Philip [name] MRN: [id number] Date of Birth: [birth year]  Today's Date: 05/14/2023 PT Individual Time: 1349-1430 PT Individual Time Calculation (min): 41 min  And  Today's Date: 05/14/2023 PT Missed Time: 34 Minutes Missed Time Reason: Other (Comment) (O2 fluctuations, fatigue and breathing treatment) And Today's Date: 05/14/2023 PT Individual Time: 4098-1191 PT Individual Time Calculation (min): 14 min  Short Term Goals: Week 1:  PT Short Term Goal 1 (Week 1): pt will tolerate sitting OOB between sessions PT Short Term Goal 1 - Progress (Week 1): Partly met PT Short Term Goal 2 (Week 1): pt will ambulate 25 feet with LRAD with continuous O2 montioring PT Short Term Goal 2 - Progress (Week 1): Partly met  Skilled Therapeutic Interventions/Progress Updates:  Patient seated upright in recliner on entrance to room. Patient alert and agreeable to PT session. Is fatigued from recent room change d/t need to be closer to nurse's station.   Patient with no pain complaint at start of session.  Readings with O2 80% to 86% at rest with pulse in 118-127 range at rest.   Pt relates need to  urinate d/t IV lasix. Pt encouraged to mobilize to toilet. Attempts but pt fatigued, weak and with drop in O2. Provided with urinal and allowed to urinate while in recliner.   Unna boots have been applied. Pt relates discomfort from increased fluid retention. Increased pain with repositioning of LE in attempt to stand. Poor O2 resilience with movement this session. And pt on 10L.   Resp Therapy Tech arriving to provide breathing treatments. Related to pt that will return later in afternoon in attempt to leave room and potentially use NuStep for session.    Patient seated upright at end of session with brakes locked, no alarm set, and all needs within reach. On 10L O2 with reading at 86% with pulse at 126.   Returned later, however pt fatigued and with  friend in room. Pt eating late lunch and O2/ pulse vitals not changed much. With pt talking, provided with continued vc for breathing in through nose when using [place] and then talking in place of PLB as this tends to help O2 readings. Pt experiences cramping in each calf and provided with pressure to muscle heads and then stretch to calf. Retrieved hot packs and placed under calves for increased pain relief. Provided with heated blanket  on request.    Therapy Documentation Precautions:  Precautions Precautions: Fall, Other (comment) Recall of Precautions/Restrictions: Intact Precaution/Restrictions Comments: Monitor O2. Goal: 88% or greater on 6L Restrictions Weight Bearing Restrictions Per Provider Order: No General: PT Amount of Missed Time (min): 34 Minutes PT Missed Treatment Reason: Other (Comment) (O2 fluctuations, fatigue and breathing treatment) Vital Signs: Therapy Vitals Temp: 98.4 F (36.9 C) Pulse Rate: (!) 126 Resp: 18 BP: 90/74 Patient Position (if appropriate): Sitting Oxygen Therapy SpO2: (!) 86 % O2 Device: Nasal Cannula O2 Flow Rate (L/min): 10 L/min Pain: Pain Assessment Pain Scale: 0-10 Pain Score: 4  Pain Location: Back Pain Intervention(s): Medication (See eMAR)   Therapy/Group: Individual Therapy  Donne Gage PT, DPT, CSRS 05/14/2023, 6:23 PM

## 2023-05-14 NOTE — Progress Notes (Addendum)
 PROGRESS NOTE   Subjective/Complaints:  Patient reports had some difficulty tolerating BiPAP last night.  Had a fair amount of fluid output with diuretics.  ROS: Patient denies fever, rash, sore throat, blurred vision, dizziness, nausea, vomiting, diarrhea, cough,   chest pain,  headache, or mood change.  + shortness of breath- continued + anxiety  Constipation- improved   Objective:   No results found.  No results for input(s): "WBC", "HGB", "HCT", "PLT" in the last 72 hours.   Recent Labs    05/14/23 0622  NA 137  K 3.3*  CL 93*  CO2 35*  GLUCOSE 110*  BUN 16  CREATININE 1.11  CALCIUM 8.7*    Intake/Output Summary (Last 24 hours) at 05/14/2023 1324 Last data filed at 05/14/2023 0754 Gross per 24 hour  Intake 340 ml  Output 3650 ml  Net -3310 ml        Physical Exam: Vital Signs Blood pressure 98/78, pulse (!) 102, temperature 98.6 F (37 C), resp. rate 18, height 5\' 7"  (1.702 m), weight 93.4 kg, SpO2 96%.   Constitutional: No distress . Vital signs reviewed.  Getting breathing treatment  HEENT: NCAT, EOMI, oral membranes moist  Neck: supple Cardiovascular: RRR without murmur. No JVD    Respiratory/Chest: CTA Bilaterally with a few scattered wheeze Normal effort  . Breathing treatment on 10L O2 [place] GI/Abdomen: BS +, non-tender, non-distended Ext: no clubbing, cyanosis, 1+ edema b/l LE  Psych: pleasant and cooperative  GU: dark urine in urinal Skin: Bruising noted on bilateral upper extremities, right upper extremity IV looks okay Neuro: Alert and oriented x 4, no apparent cognitive deficits, cranial nerves II through XII grossly intact other than hard of hearing-chronic   MOTOR: RUE: 5/5 Deltoid, 5/5 Biceps, 5/5 Triceps,5/5 Grip LUE: 5/5 Deltoid, 5/5 Biceps, 5/5 Triceps, 5/5 Grip RLE: HF 5/5, KE 5/5, ADF 5/5, APF 5/5 LLE: HF 5/5, KE 5/5, ADF 5/5, APF 5/5     SENSORY: Normal to touch all 4  extremities although altered in distal lower extremities length dependent distribution Prior neuro assessment is c/w today's exam 05/14/2023.     Assessment/Plan: 1. Functional deficits which require 3+ hours per day of interdisciplinary therapy in a comprehensive inpatient rehab setting. Physiatrist is providing close team supervision and 24 hour management of active medical problems listed below. Physiatrist and rehab team continue to assess barriers to discharge/monitor patient progress toward functional and medical goals  Care Tool:  Bathing    Body parts bathed by patient: Right arm, Left arm, Chest, Abdomen, Front perineal area, Buttocks, Right upper leg, Left upper leg, Right lower leg, Left lower leg         Bathing assist Assist Level: Minimal Assistance - Patient > 75%     Upper Body Dressing/Undressing Upper body dressing   What is the patient wearing?: Pull over shirt    Upper body assist Assist Level: Set up assist    Lower Body Dressing/Undressing Lower body dressing      What is the patient wearing?: Underwear/pull up, Pants     Lower body assist Assist for lower body dressing: Maximal Assistance - Patient 25 - 49%     Toileting  Toileting    Toileting assist Assist for toileting: Minimal Assistance - Patient > 75%     Transfers Chair/bed transfer  Transfers assist     Chair/bed transfer assist level: Supervision/Verbal cueing     Locomotion Ambulation   Ambulation assist   Ambulation activity did not occur: Safety/medical concerns  Assist level: Supervision/Verbal cueing Assistive device: Other (comment) (use of oxygen tank as AD) Max distance: 40   Walk 10 feet activity   Assist  Walk 10 feet activity did not occur: Safety/medical concerns  Assist level: Supervision/Verbal cueing Assistive device: Other (comment) (use of oxygen tank with close supervision and close WC follow)   Walk 50 feet activity   Assist Walk 50 feet with  2 turns activity did not occur: Safety/medical concerns         Walk 150 feet activity   Assist Walk 150 feet activity did not occur: Safety/medical concerns         Walk 10 feet on uneven surface  activity   Assist Walk 10 feet on uneven surfaces activity did not occur: Safety/medical concerns         Wheelchair     Assist Is the patient using a wheelchair?: Yes Type of Wheelchair: Manual    Wheelchair assist level: Dependent - Patient 0%      Wheelchair 50 feet with 2 turns activity    Assist        Assist Level: Dependent - Patient 0%   Wheelchair 150 feet activity     Assist      Assist Level: Dependent - Patient 0%   Blood pressure 98/78, pulse (!) 102, temperature 98.6 F (37 C), resp. rate 18, height 5\' 7"  (1.702 m), weight 93.4 kg, SpO2 96%.  Medical Problem List and Plan: 1. Functional deficits secondary to acute respiratory distress/acute on chronic hypoxia/Pseudomonas pneumonia.  Prednisone taper.  Patient with chronic O2.             -Patient may shower             -ELOS/Goals: 10 to 12 days, PT OT mod I             -Continue CIR therapies including PT, OT   -15/7 Schedule   -Lasix 20mg  daily started- , Cr 1.09 recheck Monday -Hold lasix tomorrow, check labs, consider restating if need -Exp DC 4/17 -CXR stable 05/12/23  -Will ask nursing to keep close eye on him tonight, should help with his anxiety too   2.  Antithrombotics: -DVT/anticoagulation:  Pharmaceutical: Xarelto for history of pulmonary emboli             -antiplatelet therapy: N/A 3. Pain Management: Oxycodone as needed             -Hx of chronic lower back pain, PDMP indicates norco 5 BID PRN  - 4/5 pain controlled, not frequently using as needed  -4/8 Schedule hydrocodone 5mg  BID 7pm and 7am  -4/9 pt reports improvement with scheduled medication  -4/10-11 pain controlled continue current 4. Mood/Behavior/Sleep: Provide emotional support              -antipsychotic agents: N/A 5. Neuropsych/cognition: This patient is capable of making decisions on his own behalf. 6. Skin/Wound Care: Routine skin checks 7. Fluids/Electrolytes/Nutrition: Routine in and outs with follow-up chemistries 8.  History of recurrent non-small cell lung cancer.  Previously received chemotherapy and immunotherapy in 2020 and 2022.  Followed outpatient by pulmonary team as well as oncology services Dr. Arbutus Ped  Mohamed 9.  History of radiation-induced IPF.  Continue bronchodilators and steroid as directed 10.  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  Followed outpatient by urology services Dr. Cardell Peach.  Continue Flomax             -Reports dark urine at baseline  -4/7-8 continent, reports at baseline  -4/9 Discussed with urology, will check U/A culture. Dark/blood tinged urine may be related to his overdue for replacement ureteral stent. As pt mentioned likely limited on options for procedural options due to pulmonary status. Could consider Pyridium  -4/10 UA with bacteria, WBC 10-20, but no nitrites, he does have burning on urination but it sounds like this is more subacute/chronic.  Will continue to monitor cultures for now  4/11 insignificant growth on urine culture, consider Pyridium if discomfort worsens 11.  Tingling bilateral feet/neuropathy-chronic             -Potentially paraneoplastic, continue to monitor  12.  Leukocytosis  -4/3 WBC within normal limits today  4/7 resolved 13. Constipation   -4/3 LBM 4/1, patient to try suppository today, continue MiraLAX and Senokot.  Add sorbitol as needed  -4/4 LBM today, large continue to monitor  -LBM 4/6, consider additional medication if NO bm today, denies feeling constipated  -4/9 order storbitol   4/10 LBM yesterday, Continue current regimen, discussed sorbitol PRN   LMB 4/10 improved 14. Lower ext edema:  -4/7 hyperkalemic (5.6) on labs today -ongoing  edema  Filed Weights   05/11/23 0659  05/13/23 0505 05/14/23 0500  Weight: 100 kg 99.8 kg 93.4 kg   -resume lasix 20mg  daily today, check labs tomorrow  -lokelma x 1 today  4/8 Cr. 1.15/BUN 43, continue current regimen. K+ down to 4.1  -Discussed leg elevated, compression stockings  -Improving with lasix  15. Acute on chronic cor pulmonale - Pulmonology consulted appreciate assistance.  Will start on Unna boots, IV diuretics, BiPAP at bedtime.  No review indicates they are try to get him in Oxymizer.  If organ dysfunction develops they may transfer for IR inotrope treatment. 16. Hyperkalemia/Hypokalemia  -BNP 200.8  -4/11 K+ 3.3 Potassium repletion was ordered by critical care today, continue to monitor   LOS: 9 days A FACE TO FACE EVALUATION WAS PERFORMED  Fanny Dance 05/14/2023, 1:24 PM

## 2023-05-14 NOTE — Progress Notes (Signed)
 Occupational Therapy Session Note  Patient Details  Name: Philip [name] MRN: [id number] Date of Birth: [birth year]  Today's Date: 05/14/2023 OT Individual Time: 4098-1191 OT Individual Time Calculation (min): 75 min    Short Term Goals: Week 1:  OT Short Term Goal 1 (Week 1): Pt will complete bathing at shower level while seated utilizing compensatory techniques while monitoring SpO2 and exertion level. OT Short Term Goal 1 - Progress (Week 1): Met OT Short Term Goal 2 (Week 1): Pt will completed dressing while seated with SBA while utilizing energy conservation techniques. OT Short Term Goal 2 - Progress (Week 1): Met  Skilled Therapeutic Interventions/Progress Updates:    Pt received in the bed and O2 sats were 90% on 10 liters of O2. Pt ambulated from bed to sink on 10 liters and it read 69-77 % on two liters and required over 10 min to recover to 88. Ended up overlapping the nasal canula with rebreather on 10 liters to come back up and recovered quickly.  Pt sat at sink to participate in face washing and shaving but remain in high 70s -80s on 10 Liters on nasal canula. Pt continues to needs multiple prolonged rest breaks to return to 90%. Continued to work on endurance with activity. Pt propelled from room to the RN station with bilateral feet - but this task took him 45 min to complete in order to maintain sats. Pt would desat to 79 on 10 liters but he could feel when he desats and would stop to work on recovering- pt needed 1-2 min to recover each time.  Pt brushed teeth at sink and desat to 77 but then could recover within 2 min.  Pt transitioned to the recliner and left resting in the recliner.   Therapy Documentation Precautions:  Precautions Precautions: Fall, Other (comment) Recall of Precautions/Restrictions: Intact Precaution/Restrictions Comments: Monitor O2. Goal: 88% or greater on 6L Restrictions Weight Bearing Restrictions Per Provider Order: No  Pain:  No reports    Therapy/Group: Individual Therapy  Roney Mans [facility] 05/14/2023, 9:47 AM

## 2023-05-14 NOTE — Progress Notes (Signed)
   05/14/23 2336  BiPAP/CPAP/SIPAP  $ Non-Invasive Ventilator  Non-Invasive Vent Subsequent  BiPAP/CPAP/SIPAP Pt Type Adult  BiPAP/CPAP/SIPAP Resmed  Mask Type Full face mask  Mask Size Medium  IPAP 23 cmH20  EPAP 8 cmH2O  Flow Rate 10 lpm  Patient Home Machine No  Patient Home Mask No  Patient Home Tubing No  Auto Titrate No  CPAP/SIPAP surface wiped down Yes  Device Plugged into RED Power Outlet Yes  BiPAP/CPAP /SiPAP Vitals  Pulse Rate (!) 108  Resp 17  SpO2 90 %  Bilateral Breath Sounds Clear;Diminished  MEWS Score/Color  MEWS Score 1  MEWS Score Color Green

## 2023-05-14 NOTE — Progress Notes (Signed)
 RT called to clarify use of 02 per [place] , non rebreather  mask  and venturi mask. Per RT order will be updated.

## 2023-05-14 NOTE — Progress Notes (Signed)
 Orthopedic Tech Progress Note Patient Details:  Philip [name] 1955-06-27 [id number]  Ortho Devices Type of Ortho Device: Roland Rack boot Ortho Device/Splint Location: Bilateral Ortho Device/Splint Interventions: Ordered, Application   Post Interventions Patient Tolerated: Well Instructions Provided: Care of device, Adjustment of device  Tonye Pearson 05/14/2023, 9:38 AM

## 2023-05-15 DIAGNOSIS — J9601 Acute respiratory failure with hypoxia: Secondary | ICD-10-CM

## 2023-05-15 LAB — BASIC METABOLIC PANEL WITH GFR
Anion gap: 11 (ref 5–15)
BUN: 18 mg/dL (ref 8–23)
CO2: 33 mmol/L — ABNORMAL HIGH (ref 22–32)
Calcium: 9.1 mg/dL (ref 8.9–10.3)
Chloride: 92 mmol/L — ABNORMAL LOW (ref 98–111)
Creatinine, Ser: 1.2 mg/dL (ref 0.61–1.24)
GFR, Estimated: >60 mL/min (ref >60)
Glucose, Bld: 123 mg/dL — ABNORMAL HIGH (ref 70–99)
Potassium: 4 mmol/L (ref 3.5–5.1)
Sodium: 136 mmol/L (ref 135–145)

## 2023-05-15 LAB — MAGNESIUM: Magnesium: 2 mg/dL (ref 1.7–2.4)

## 2023-05-15 MED ORDER — FUROSEMIDE 10 MG/ML IJ SOLN
60.0000 mg | Freq: Four times a day (QID) | INTRAMUSCULAR | Status: AC
  Administered 2023-05-15 (×2): 60 mg via INTRAVENOUS
  Filled 2023-05-15 (×3): qty 6

## 2023-05-15 MED ORDER — POTASSIUM CHLORIDE CRYS ER 20 MEQ PO TBCR
40.0000 meq | EXTENDED_RELEASE_TABLET | Freq: Two times a day (BID) | ORAL | Status: DC
  Administered 2023-05-15 – 2023-05-19 (×8): 40 meq via ORAL
  Filled 2023-05-15 (×8): qty 2

## 2023-05-15 MED ORDER — MAGNESIUM GLUCONATE 500 (27 MG) MG PO TABS
250.0000 mg | ORAL_TABLET | Freq: Every day | ORAL | Status: DC
  Administered 2023-05-15 – 2023-05-16 (×2): 250 mg via ORAL
  Filled 2023-05-15 (×2): qty 1

## 2023-05-15 NOTE — Progress Notes (Signed)
 Physical Therapy Session Note  Patient Details  Name: Philip [name] MRN: [id number] Date of Birth: 03/04/1955  Today's Date: 05/15/2023 PT Individual Time: 0905-1004 PT Individual Time Calculation (min): 59 min   Short Term Goals: Week 1:  PT Short Term Goal 1 (Week 1): pt will tolerate sitting OOB between sessions PT Short Term Goal 1 - Progress (Week 1): Partly met PT Short Term Goal 2 (Week 1): pt will ambulate 25 feet with LRAD with continuous O2 montioring PT Short Term Goal 2 - Progress (Week 1): Partly met  Skilled Therapeutic Interventions/Progress Updates: Patient supine in bed on entrance to room. Patient alert and agreeable to PT session.   Patient reported no pain during session, only fatigue. Pt used acapella throughout session in order to try to avoid needing to have increased O2 from secondary tank when starting to desat.  Therapeutic Activity: Bed Mobility: Pt performed supine<sit on EOB with supervision and use of bed features. Transfers: Pt performed sit<>stand transfers throughout session with CGA/close supervision for safety on 10L nasal cannula when transferring from EOB<WC at beginning of session, and from WC<NuStep (SpO2 desated to 78% at lowest with pt able to tell when needing to have increased O2 to 20L briefly with non-re-breather). Pt otherwise able to rebound on 10L when transferred from Nustep<WC<recliner.  Therapeutic Exercise: Pt performed the following exercises with therapist providing the described cuing and facilitation for improvement. - NuStep with pt averaging 30-40 spm for 3 trials. Pt required roughly 1:30 min break to rebound on all 3 trials on 10L O2. (1 min x 1:30 min x 1:45 min).  Patient sitting in recliner at end of session with brakes locked, chair alarm set, and all needs within reach.      Therapy Documentation Precautions:  Precautions Precautions: Fall, Other (comment) Recall of Precautions/Restrictions:  Intact Precaution/Restrictions Comments: Monitor O2. Goal: 88% or greater on 6L Restrictions Weight Bearing Restrictions Per Provider Order: No  Therapy/Group: Individual Therapy  Schae Cando PTA 05/15/2023, 12:09 PM

## 2023-05-15 NOTE — Progress Notes (Signed)
   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   Has had incr O2 requirements in rehab, desats with exertion, is slow to recoup.  There are concerns regarding home O2 options.  PCCM is consulted in this setting  Admits to 20 lb (!!) weight gain in past week and increased leg swelling  Pertinent  Medical History  Chronic hypoxic resp failure IPF Lung cancer Bronchiectasis COPD GERD MAI  PE  Chronic AC (xarelto)   Significant Hospital Events: Including procedures, antibiotic start and stop dates in addition to other pertinent events     Interim History / Subjective:  Good UOP  Objective   Blood pressure 112/82, pulse (!) 112, temperature 98.3 F (36.8 C), resp. rate 14, height 5\' 7"  (1.702 m), weight 93 kg, SpO2 92%.        Intake/Output Summary (Last 24 hours) at 05/15/2023 1404 Last data filed at 05/15/2023 0800 Gross per 24 hour  Intake 474 ml  Output 2450 ml  Net -1976 ml   Filed Weights   05/13/23 0505 05/14/23 0500 05/15/23 0500  Weight: 99.8 kg 93.4 kg 93 kg    Physical Exam: General: Well-appearing, no acute distress HENT: [place], AT Eyes: EOMI, no scleral icterus Respiratory: Bibasilar crackles Cardiovascular: RRR, -M/R/G, no JVD Extremities:LE edema, wrapped,-tenderness Neuro: AAO x4, CNII-XII grossly intact Psych: Normal mood, normal affect   Resolved Hospital Problem list   N/A  Assessment & Plan:   Acute on chronic cor pulmonale- has all the hallmarks of RV failure and his echo shows it also  although read as normal.  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  CT also suggestive of tracheobronchomalacia.  Cannot r/o group IV but already on AC. Good UOP Plan Continue diuresis Replete potassium Continue Unna boots Encourage BiPAP nightly as tolerated   Best Practice (right click and "Reselect all SmartList Selections" daily)   Care Time: 35 min  Genetta Kenning, M.D. [facility] Pulmonary/Critical Care Medicine 05/15/2023 2:04 PM   See Amion for personal pager For hours between 7 PM to 7 AM, please call Elink for urgent questions

## 2023-05-15 NOTE — Progress Notes (Signed)
 Occupational Therapy Session Note  Patient Details  Name: Philip [name] MRN: [id number] Date of Birth: August 05, 1955  Today's Date: 05/15/2023 OT Individual Time: 0225-0330 OT Individual Time Calculation (min): 65 min    Short Term Goals: Week 1:  OT Short Term Goal 1 (Week 1): Pt will complete bathing at shower level while seated utilizing compensatory techniques while monitoring SpO2 and exertion level. OT Short Term Goal 1 - Progress (Week 1): Met OT Short Term Goal 2 (Week 1): Pt will completed dressing while seated with SBA while utilizing energy conservation techniques. OT Short Term Goal 2 - Progress (Week 1): Met  Skilled Therapeutic Interventions/Progress Updates:    Patient in the room working with nursing attempting to address his 02 numbers at the time of arrival. Patient in agreement with completing bathing exercise at chair LOF. The pt was able to wash his face, chest and arms with s/u A.  The pt 02 saturation were ranging from 84- 90 with continuous flow of 11L's and the tank rushing in intervals to bring his numbers to 96-97%. The pt was able to doff his shirt with MinA and he was MinA for donning his gown. The pt was transferred from chair LOF to EOB with CGA he was able to transfer back to bed LOF with SBA. Respiratory came in an administered a treatment and indicated that he would place the bedside table  and call light in place prior to exiting the room.  Therapy Documentation Precautions:  Precautions Precautions: Fall, Other (comment) Recall of Precautions/Restrictions: Intact Precaution/Restrictions Comments: Monitor O2. Goal: 88% or greater on 6L Restrictions Weight Bearing Restrictions Per Provider Order: No   Therapy/Group: Individual Therapy  [name] 05/15/2023, 3:45 PM

## 2023-05-15 NOTE — Progress Notes (Signed)
 PROGRESS NOTE   Subjective/Complaints: No new complaints this morning Appreciate critical care following him Tolerated PT well today +fatigue   ROS: Patient denies fever, rash, sore throat, blurred vision, dizziness, nausea, vomiting, diarrhea, cough,   chest pain,  headache, or mood change.  + shortness of breath- continued + anxiety  Constipation- improved   Objective:   No results found.  No results for input(s): "WBC", "HGB", "HCT", "PLT" in the last 72 hours.   Recent Labs    05/14/23 0622 05/15/23 1307  NA 137 136  K 3.3* 4.0  CL 93* 92*  CO2 35* 33*  GLUCOSE 110* 123*  BUN 16 18  CREATININE 1.11 1.20  CALCIUM 8.7* 9.1    Intake/Output Summary (Last 24 hours) at 05/15/2023 1451 Last data filed at 05/15/2023 1432 Gross per 24 hour  Intake 474 ml  Output 2000 ml  Net -1526 ml        Physical Exam: Vital Signs Blood pressure 112/82, pulse (!) 112, temperature 98.3 F (36.8 C), resp. rate 14, height 5\' 7"  (1.702 m), weight 93 kg, SpO2 92%.   Constitutional: No distress . Vital signs reviewed.  Getting breathing treatment  HEENT: NCAT, EOMI, oral membranes moist  Neck: supple Cardiovascular: Tachycardia Respiratory/Chest: CTA Bilaterally with a few scattered wheeze Normal effort  . Breathing treatment on 10L O2 [place] GI/Abdomen: BS +, non-tender, non-distended Ext: no clubbing, cyanosis, 1+ edema b/l LE  Psych: pleasant and cooperative  GU: dark urine in urinal Skin: Bruising noted on bilateral upper extremities, right upper extremity IV looks okay Neuro: Alert and oriented x 4, no apparent cognitive deficits, cranial nerves II through XII grossly intact other than hard of hearing-chronic   MOTOR: RUE: 5/5 Deltoid, 5/5 Biceps, 5/5 Triceps,5/5 Grip LUE: 5/5 Deltoid, 5/5 Biceps, 5/5 Triceps, 5/5 Grip RLE: HF 5/5, KE 5/5, ADF 5/5, APF 5/5 LLE: HF 5/5, KE 5/5, ADF 5/5, APF 5/5     SENSORY: Normal  to touch all 4 extremities although altered in distal lower extremities length dependent distribution Prior neuro assessment is c/w today's exam 05/15/2023.     Assessment/Plan: 1. Functional deficits which require 3+ hours per day of interdisciplinary therapy in a comprehensive inpatient rehab setting. Physiatrist is providing close team supervision and 24 hour management of active medical problems listed below. Physiatrist and rehab team continue to assess barriers to discharge/monitor patient progress toward functional and medical goals  Care Tool:  Bathing    Body parts bathed by patient: Right arm, Left arm, Chest, Abdomen, Front perineal area, Buttocks, Right upper leg, Left upper leg, Right lower leg, Left lower leg         Bathing assist Assist Level: Minimal Assistance - Patient > 75%     Upper Body Dressing/Undressing Upper body dressing   What is the patient wearing?: Pull over shirt    Upper body assist Assist Level: Set up assist    Lower Body Dressing/Undressing Lower body dressing      What is the patient wearing?: Underwear/pull up, Pants     Lower body assist Assist for lower body dressing: Maximal Assistance - Patient 25 - 49%     Toileting Toileting  Toileting assist Assist for toileting: Minimal Assistance - Patient > 75%     Transfers Chair/bed transfer  Transfers assist     Chair/bed transfer assist level: Supervision/Verbal cueing     Locomotion Ambulation   Ambulation assist   Ambulation activity did not occur: Safety/medical concerns  Assist level: Supervision/Verbal cueing Assistive device: Other (comment) (use of oxygen tank as AD) Max distance: 40   Walk 10 feet activity   Assist  Walk 10 feet activity did not occur: Safety/medical concerns  Assist level: Supervision/Verbal cueing Assistive device: Other (comment) (use of oxygen tank with close supervision and close WC follow)   Walk 50 feet activity   Assist  Walk 50 feet with 2 turns activity did not occur: Safety/medical concerns         Walk 150 feet activity   Assist Walk 150 feet activity did not occur: Safety/medical concerns         Walk 10 feet on uneven surface  activity   Assist Walk 10 feet on uneven surfaces activity did not occur: Safety/medical concerns         Wheelchair     Assist Is the patient using a wheelchair?: Yes Type of Wheelchair: Manual    Wheelchair assist level: Dependent - Patient 0%      Wheelchair 50 feet with 2 turns activity    Assist        Assist Level: Dependent - Patient 0%   Wheelchair 150 feet activity     Assist      Assist Level: Dependent - Patient 0%   Blood pressure 112/82, pulse (!) 112, temperature 98.3 F (36.8 C), resp. rate 14, height 5\' 7"  (1.702 m), weight 93 kg, SpO2 92%.  Medical Problem List and Plan: 1. Functional deficits secondary to acute respiratory distress/acute on chronic hypoxia/Pseudomonas pneumonia.  Prednisone taper.  Patient with chronic O2.             -Patient may shower             -ELOS/Goals: 10 to 12 days, PT OT mod I             -Continue CIR therapies including PT, OT   -15/7 Schedule   -Lasix 20mg  daily started- , Cr 1.09 recheck Monday -Hold lasix tomorrow, check labs, consider restating if need -Exp DC 4/17 -CXR stable 05/12/23  -Will ask nursing to keep close eye on him tonight, should help with his anxiety too   2.  Antithrombotics: -DVT/anticoagulation:  Pharmaceutical: Xarelto for history of pulmonary emboli             -antiplatelet therapy: N/A 3. Pain Management: Oxycodone as needed             -Hx of chronic lower back pain, PDMP indicates norco 5 BID PRN  - 4/5 pain controlled, not frequently using as needed  -4/8 Schedule hydrocodone 5mg  BID 7pm and 7am  -4/9 pt reports improvement with scheduled medication  -4/10-11 pain controlled continue current 4. Mood/Behavior/Sleep: Provide emotional support              -antipsychotic agents: N/A 5. Neuropsych/cognition: This patient is capable of making decisions on his own behalf. 6. Skin/Wound Care: Routine skin checks 7. Fluids/Electrolytes/Nutrition: Routine in and outs with follow-up chemistries 8.  History of recurrent non-small cell lung cancer.  Previously received chemotherapy and immunotherapy in 2020 and 2022.  Followed outpatient by pulmonary team as well as oncology services Dr. Marlene Simas 9.  History  of radiation-induced IPF.  Continue bronchodilators and steroid as directed 10.  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  Followed outpatient by urology services Dr. Freddi Jaeger.  Continue Flomax             -Reports dark urine at baseline  -4/7-8 continent, reports at baseline  -4/9 Discussed with urology, will check U/A culture. Dark/blood tinged urine may be related to his overdue for replacement ureteral stent. As pt mentioned likely limited on options for procedural options due to pulmonary status. Could consider Pyridium  -4/10 UA with bacteria, WBC 10-20, but no nitrites, he does have burning on urination but it sounds like this is more subacute/chronic.  Will continue to monitor cultures for now  4/11 insignificant growth on urine culture, consider Pyridium if discomfort worsens 11.  Tingling bilateral feet/neuropathy-chronic             -Potentially paraneoplastic, continue to monitor  12.  Leukocytosis  -4/3 WBC within normal limits today  4/7 resolved 13. Constipation   -4/3 LBM 4/1, patient to try suppository today, continue MiraLAX and Senokot.  Add sorbitol as needed  -4/4 LBM today, large continue to monitor  -LBM 4/6, consider additional medication if NO bm today, denies feeling constipated  -4/9 order storbitol   4/10 LBM yesterday, Continue current regimen, discussed sorbitol PRN   LMB 4/10 improved 14. Lower ext edema:  -4/7 hyperkalemic (5.6) on labs today -ongoing  edema  Filed Weights   05/13/23  0505 05/14/23 0500 05/15/23 0500  Weight: 99.8 kg 93.4 kg 93 kg   -resume lasix 20mg  daily today, check labs tomorrow  -lokelma x 1 today  4/8 Cr. 1.15/BUN 43, continue current regimen. K+ down to 4.1  -Discussed leg elevated, compression stockings  -Improving with lasix   15. Acute on chronic cor pulmonale - Pulmonology consulted appreciate assistance.  Continue Unna boots, IV diuretics, BiPAP at bedtime.  No review indicates they are try to get him in Oxymizer.  If organ dysfunction develops they may transfer for IR inotrope treatment.  16. Hyperkalemia/Hypokalemia  -BNP 200.8  Potassium reviewed and has normalized  17. Tachycardia: magnesium supplement added  18. Screening for vitamin D deficiency: will check level on Monday  LOS: 10 days A FACE TO FACE EVALUATION WAS PERFORMED  Keven Pel Everlean Bucher 05/15/2023, 2:51 PM

## 2023-05-16 DIAGNOSIS — J9601 Acute respiratory failure with hypoxia: Secondary | ICD-10-CM | POA: Diagnosis not present

## 2023-05-16 MED ORDER — MAGNESIUM GLUCONATE 500 (27 MG) MG PO TABS
500.0000 mg | ORAL_TABLET | Freq: Every day | ORAL | Status: DC
  Administered 2023-05-17 – 2023-05-19 (×3): 500 mg via ORAL
  Filled 2023-05-16 (×3): qty 1

## 2023-05-16 MED ORDER — IPRATROPIUM-ALBUTEROL 0.5-2.5 (3) MG/3ML IN SOLN
3.0000 mL | Freq: Two times a day (BID) | RESPIRATORY_TRACT | Status: DC
  Administered 2023-05-17: 3 mL via RESPIRATORY_TRACT
  Filled 2023-05-16: qty 3

## 2023-05-16 MED ORDER — FUROSEMIDE 10 MG/ML IJ SOLN
60.0000 mg | Freq: Four times a day (QID) | INTRAMUSCULAR | Status: AC
  Administered 2023-05-16 (×2): 60 mg via INTRAVENOUS
  Filled 2023-05-16 (×2): qty 6

## 2023-05-16 NOTE — Progress Notes (Signed)
 Occupational Therapy Session Note  Patient Details  Name: DONNE BALEY MRN: [id number] Date of Birth: 07-14-1955  {CHL IP REHAB OT TIME CALCULATIONS:[id number]}   Short Term Goals: Week 2:  OT Short Term Goal 1 (Week 2): pt will maintain sats using compensatory strategies as needed during 1 ADL task OT Short Term Goal 2 (Week 2): pt will tolerate standing for 30 secs while maintaining sats using compensatory strategies as needed  Skilled Therapeutic Interventions/Progress Updates:    Patient agreeable to participate in OT session. Reports *** pain level.   Patient participated in skilled OT session focusing on ***. Therapist facilitated/assessed/developed/educated/integrated/elicited *** in order to improve/facilitate/promote    Therapy Documentation Precautions:  Precautions Precautions: Fall, Other (comment) Recall of Precautions/Restrictions: Intact Precaution/Restrictions Comments: Monitor O2. Goal: 88% or greater on 6L Restrictions Weight Bearing Restrictions Per Provider Order: No  Therapy/Group: Individual Therapy  Carollee Circle, OTR/L,CBIS  Supplemental OT - [facility] Secure Chat Preferred   05/16/2023, 9:36 PM

## 2023-05-16 NOTE — Progress Notes (Signed)
 Occupational Therapy Session Note  Patient Details  Name: Philip [name] MRN: [id number] Date of Birth: 09-Apr-1955  Today's Date: 05/16/2023 OT Individual Time: 0100-0145 OT Individual Time Calculation (min): 45 min    Short Term Goals: Week 1:  OT Short Term Goal 1 (Week 1): Pt will complete bathing at shower level while seated utilizing compensatory techniques while monitoring SpO2 and exertion level. OT Short Term Goal 1 - Progress (Week 1): Met OT Short Term Goal 2 (Week 1): Pt will completed dressing while seated with SBA while utilizing energy conservation techniques. OT Short Term Goal 2 - Progress (Week 1): Met  Skilled Therapeutic Interventions/Progress Updates:     Patient seen this day for skilled OT treatment. Patient seated in the recliner completing relaxation breathing trying to improve his O2 expenditure.  The pt agreed to complete a simple BADL related task in changing his clothing items into a gown and brushing his teeth at chair LOF. The pt was able to change his gown with MinA, he was able to brush his teeth with s/u A and additional time.  The pt was instructed in EC techniques to improve his Ind in a safe  manner.  The pt went on to complete the UB cycle for 10 minutes with  2 rest breaks. At the end of the session, the pt's family came in and was able to share a little family time. I was able to place the call light and bedside table within reach and address any additional needs of the pt prior to exiting the room.  Therapy Documentation Precautions:  Precautions Precautions: Fall, Other (comment) Recall of Precautions/Restrictions: Intact Precaution/Restrictions Comments: Monitor O2. Goal: 88% or greater on 6L Restrictions Weight Bearing Restrictions Per Provider Order: No  Therapy/Group: Individual Therapy  [name] 05/16/2023, 4:03 PM

## 2023-05-16 NOTE — Progress Notes (Signed)
 PROGRESS NOTE   Subjective/Complaints: Discussed with [facility] PT and desaturations are still an issue but have improved Discussed with patient that he has lost a lot of weight   ROS: Patient denies fever, rash, sore throat, blurred vision, dizziness, nausea, vomiting, diarrhea, cough,   chest pain,  headache, or mood change.  + shortness of breath- continued, improved + anxiety  Constipation- improved   Objective:   No results found.  No results for input(s): "WBC", "HGB", "HCT", "PLT" in the last 72 hours.   Recent Labs    05/14/23 0622 05/15/23 1307  NA 137 136  K 3.3* 4.0  CL 93* 92*  CO2 35* 33*  GLUCOSE 110* 123*  BUN 16 18  CREATININE 1.11 1.20  CALCIUM 8.7* 9.1    Intake/Output Summary (Last 24 hours) at 05/16/2023 1601 Last data filed at 05/16/2023 1448 Gross per 24 hour  Intake 456 ml  Output 3750 ml  Net -3294 ml        Physical Exam: Vital Signs Blood pressure 118/79, pulse (!) 112, temperature 98.5 F (36.9 C), temperature source Oral, resp. rate 20, height 5\' 7"  (1.702 m), weight 93 kg, SpO2 94%.   Constitutional: No distress . Vital signs reviewed.  Getting breathing treatment  HEENT: NCAT, EOMI, oral membranes moist  Neck: supple Cardiovascular: Tachycardia Respiratory/Chest: CTA Bilaterally with a few scattered wheeze Normal effort  . Breathing treatment on 10L O2 [place] GI/Abdomen: BS +, non-tender, non-distended Ext: no clubbing, cyanosis, 1+ edema b/l LE  Psych: pleasant and cooperative  GU: dark urine in urinal Skin: Bruising noted on bilateral upper extremities, right upper extremity IV looks okay Neuro: Alert and oriented x 4, no apparent cognitive deficits, cranial nerves II through XII grossly intact other than hard of hearing-chronic   MOTOR: RUE: 5/5 Deltoid, 5/5 Biceps, 5/5 Triceps,5/5 Grip LUE: 5/5 Deltoid, 5/5 Biceps, 5/5 Triceps, 5/5 Grip RLE: HF 5/5, KE 5/5, ADF 5/5, APF  5/5 LLE: HF 5/5, KE 5/5, ADF 5/5, APF 5/5, stable 4/13     SENSORY: Normal to touch all 4 extremities although altered in distal lower extremities length dependent distribution Prior neuro assessment is c/w today's exam 05/16/2023.     Assessment/Plan: 1. Functional deficits which require 3+ hours per day of interdisciplinary therapy in a comprehensive inpatient rehab setting. Physiatrist is providing close team supervision and 24 hour management of active medical problems listed below. Physiatrist and rehab team continue to assess barriers to discharge/monitor patient progress toward functional and medical goals  Care Tool:  Bathing    Body parts bathed by patient: Right arm, Left arm, Chest, Abdomen, Front perineal area, Buttocks, Right upper leg, Left upper leg, Right lower leg, Left lower leg         Bathing assist Assist Level: Minimal Assistance - Patient > 75%     Upper Body Dressing/Undressing Upper body dressing   What is the patient wearing?: Pull over shirt    Upper body assist Assist Level: Set up assist    Lower Body Dressing/Undressing Lower body dressing      What is the patient wearing?: Underwear/pull up, Pants     Lower body assist Assist for lower  body dressing: Maximal Assistance - Patient 25 - 49%     Toileting Toileting    Toileting assist Assist for toileting: Minimal Assistance - Patient > 75%     Transfers Chair/bed transfer  Transfers assist     Chair/bed transfer assist level: Supervision/Verbal cueing     Locomotion Ambulation   Ambulation assist   Ambulation activity did not occur: Safety/medical concerns  Assist level: Supervision/Verbal cueing Assistive device: Other (comment) (use of oxygen tank as AD) Max distance: 40   Walk 10 feet activity   Assist  Walk 10 feet activity did not occur: Safety/medical concerns  Assist level: Supervision/Verbal cueing Assistive device: Other (comment) (use of oxygen tank with  close supervision and close WC follow)   Walk 50 feet activity   Assist Walk 50 feet with 2 turns activity did not occur: Safety/medical concerns         Walk 150 feet activity   Assist Walk 150 feet activity did not occur: Safety/medical concerns         Walk 10 feet on uneven surface  activity   Assist Walk 10 feet on uneven surfaces activity did not occur: Safety/medical concerns         Wheelchair     Assist Is the patient using a wheelchair?: Yes Type of Wheelchair: Manual    Wheelchair assist level: Dependent - Patient 0%      Wheelchair 50 feet with 2 turns activity    Assist        Assist Level: Dependent - Patient 0%   Wheelchair 150 feet activity     Assist      Assist Level: Dependent - Patient 0%   Blood pressure 118/79, pulse (!) 112, temperature 98.5 F (36.9 C), temperature source Oral, resp. rate 20, height 5\' 7"  (1.702 m), weight 93 kg, SpO2 94%.  Medical Problem List and Plan: 1. Functional deficits secondary to acute respiratory distress/acute on chronic hypoxia/Pseudomonas pneumonia.  Prednisone taper.  Patient with chronic O2.             -Patient may shower             -ELOS/Goals: 10 to 12 days, PT OT mod I             -Continue CIR therapies including PT, OT   -15/7 Schedule   -Lasix 20mg  daily started- , Cr 1.09 recheck Monday -Hold lasix tomorrow, check labs, consider restating if need -Exp DC 4/17 -CXR stable 05/12/23  -Will ask nursing to keep close eye on him tonight, should help with his anxiety too   2.  Antithrombotics: -DVT/anticoagulation:  Pharmaceutical: Xarelto for history of pulmonary emboli             -antiplatelet therapy: N/A 3. Pain Management: Oxycodone as needed             -Hx of chronic lower back pain, PDMP indicates norco 5 BID PRN  - 4/5 pain controlled, not frequently using as needed  -4/8 Schedule hydrocodone 5mg  BID 7pm and 7am  -4/9 pt reports improvement with scheduled  medication  -4/10-11 pain controlled continue current 4. Mood/Behavior/Sleep: Provide emotional support             -antipsychotic agents: N/A 5. Neuropsych/cognition: This patient is capable of making decisions on his own behalf. 6. Skin/Wound Care: Routine skin checks 7. Fluids/Electrolytes/Nutrition: Routine in and outs with follow-up chemistries 8.  History of recurrent non-small cell lung cancer.  Previously received chemotherapy and immunotherapy  in 2020 and 2022.  Followed outpatient by pulmonary team as well as oncology services Dr. Marlene Simas 9.  History of radiation-induced IPF.  Continue bronchodilators and steroid as directed 10.  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  Followed outpatient by urology services Dr. Freddi Jaeger.  Continue Flomax             -Reports dark urine at baseline  -4/7-8 continent, reports at baseline  -4/9 Discussed with urology, will check U/A culture. Dark/blood tinged urine may be related to his overdue for replacement ureteral stent. As pt mentioned likely limited on options for procedural options due to pulmonary status. Could consider Pyridium  -4/10 UA with bacteria, WBC 10-20, but no nitrites, he does have burning on urination but it sounds like this is more subacute/chronic.  Will continue to monitor cultures for now  4/11 insignificant growth on urine culture, consider Pyridium if discomfort worsens 11.  Tingling bilateral feet/neuropathy-chronic             -Potentially paraneoplastic, continue to monitor  12.  Leukocytosis  -4/3 WBC within normal limits today  4/7 resolved 13. Constipation   -4/3 LBM 4/1, patient to try suppository today, continue MiraLAX and Senokot.  Add sorbitol as needed  -4/4 LBM today, large continue to monitor  -LBM 4/6, consider additional medication if NO bm today, denies feeling constipated  -4/9 order storbitol   4/10 LBM yesterday, Continue current regimen, discussed sorbitol PRN   LMB 4/10  improved 14. Lower ext edema:  -4/7 hyperkalemic (5.6) on labs today -ongoing  edema  Filed Weights   05/13/23 0505 05/14/23 0500 05/15/23 0500  Weight: 99.8 kg 93.4 kg 93 kg   -resume lasix 20mg  daily today, check labs tomorrow  -lokelma x 1 today  4/8 Cr. 1.15/BUN 43, continue current regimen. K+ down to 4.1  -Discussed leg elevated, compression stockings  -Improving with lasix   15. Acute on chronic cor pulmonale - Pulmonology consulted appreciate assistance.  continue Unna boots, IV diuretics, BiPAP at bedtime.  No review indicates they are try to get him in Oxymizer.  If organ dysfunction develops they may transfer for IR inotrope treatment.  16. Hyperkalemia/Hypokalemia  -BNP 200.8  Potassium reviewed and has normalized, repeat tomorrow  17. Tachycardia: magnesium supplement added, increase to 500mg   18. Screening for vitamin D deficiency: will check level on Monday  LOS: 11 days A FACE TO FACE EVALUATION WAS PERFORMED  Philip [name] P Odester Nilson 05/16/2023, 4:01 PM

## 2023-05-16 NOTE — Progress Notes (Signed)
   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   Has had incr O2 requirements in rehab, desats with exertion, is slow to recoup.  There are concerns regarding home O2 options.  PCCM is consulted in this setting  Admits to 20 lb (!!) weight gain in past week and increased leg swelling  Pertinent  Medical History  Chronic hypoxic resp failure IPF Lung cancer Bronchiectasis COPD GERD MAI  PE  Chronic AC (xarelto)   Significant Hospital Events: Including procedures, antibiotic start and stop dates in addition to other pertinent events     Interim History / Subjective:  Good UOP 3.8 L Tolerated CPAP overnight Objective   Blood pressure 118/79, pulse (!) 112, temperature 98.5 F (36.9 C), temperature source Oral, resp. rate 20, height 5\' 7"  (1.702 m), weight 93 kg, SpO2 94%.    Pressure Support:  [10 cmH20] 10 cmH20   Intake/Output Summary (Last 24 hours) at 05/16/2023 1554 Last data filed at 05/16/2023 1448 Gross per 24 hour  Intake 456 ml  Output 3750 ml  Net -3294 ml   Filed Weights   05/13/23 0505 05/14/23 0500 05/15/23 0500  Weight: 99.8 kg 93.4 kg 93 kg   Physical Exam: General: Well-appearing, no acute distress HENT: [place], AT, OP clear, MMM Eyes: EOMI, no scleral icterus Respiratory: Improved bibasilar crackles Cardiovascular: RRR, -M/R/G, no JVD Extremities:-Edema,-tenderness Neuro: AAO x4, CNII-XII grossly intact   Resolved Hospital Problem list   N/A  Assessment & Plan:   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  CT also suggestive of tracheobronchomalacia.  Cannot r/o group IV but already on AC. Good UOP Plan Repeat diuresis Replete potassium Continue Unna boots Continue CPAP nightly as tolerated   Best Practice (right click and "Reselect all SmartList Selections" daily)   Care Time: 36 min  Genetta Kenning, M.D. [facility] Pulmonary/Critical Care Medicine 05/16/2023 3:54 PM   See Amion for personal pager For hours between 7 PM to 7 AM, please call Elink for urgent questions

## 2023-05-16 NOTE — Plan of Care (Signed)
  Problem: Consults Goal: RH GENERAL PATIENT EDUCATION Description: See Patient Education module for education specifics. Outcome: Progressing Goal: Skin Care Protocol Initiated - if Braden Score 18 or less Description: If consults are not indicated, leave blank or document N/A Outcome: Progressing Goal: Nutrition Consult-if indicated Outcome: Progressing Goal: Diabetes Guidelines if Diabetic/Glucose > 140 Description: If diabetic or lab glucose is > 140 mg/dl - Initiate Diabetes/Hyperglycemia Guidelines & Document Interventions  Outcome: Progressing   Problem: RH BOWEL ELIMINATION Goal: RH STG MANAGE BOWEL WITH ASSISTANCE Description: STG Manage Bowel Mod I with Assistance with medications Outcome: Progressing   Problem: RH BLADDER ELIMINATION Goal: RH STG MANAGE BLADDER WITH ASSISTANCE Description: STG Manage Bladder With Mod I Assistance Outcome: Progressing   Problem: RH SKIN INTEGRITY Goal: RH STG SKIN FREE OF INFECTION/BREAKDOWN Outcome: Progressing   Problem: RH SAFETY Goal: RH STG ADHERE TO SAFETY PRECAUTIONS W/ASSISTANCE/DEVICE Description: STG Adhere to Safety Precautions With Mod I Assistance/Device. Outcome: Progressing   Problem: RH PAIN MANAGEMENT Goal: RH STG PAIN MANAGED AT OR BELOW PT'S PAIN GOAL Description: <4 w// prns Outcome: Progressing   Problem: RH KNOWLEDGE DEFICIT GENERAL Goal: RH STG INCREASE KNOWLEDGE OF SELF CARE AFTER HOSPITALIZATION Description: Manage increase knowledge of self care after hospitalization using educational materials provided Outcome: Progressing

## 2023-05-16 NOTE — Progress Notes (Signed)
 Orthopedic Tech Progress Note Patient Details:  ZAE KIRTZ May 31, 1955 [id number]  [facility], RN called to inform us  this pt will need unna boots reapplied as the pt's daughter removed them to massage his feet/legs. I let him know I may not be able to get to them today, but will let the night shift ortho tech know during hand-off.  Patient ID: MICIAH COVELLI, male   DOB: [birth year], 68 y.o.   MRN: [id number]  Cozette Divine 05/16/2023, 7:17 PM

## 2023-05-16 NOTE — Progress Notes (Signed)
 Physical Therapy Session Note  Patient Details  Name: Philip [name] MRN: [id number] Date of Birth: September 11, 1955  Today's Date: 05/16/2023 PT Individual Time: 0246-0332 PT Individual Time Calculation (min): 46 min   Short Term Goals: Week 1:  PT Short Term Goal 1 (Week 1): pt will tolerate sitting OOB between sessions PT Short Term Goal 1 - Progress (Week 1): Partly met PT Short Term Goal 2 (Week 1): pt will ambulate 25 feet with LRAD with continuous O2 montioring PT Short Term Goal 2 - Progress (Week 1): Partly met  Skilled Therapeutic Interventions/Progress Updates:   Pt received sitting in recliner without seat alarm on and had just finished having a bowel movement and washup with tech. Pt reports feeling fatigued after activity but agreeable to PT tx and reports absent pain.  Education on breathing performed during session with extended rest breaks needed for status. Pt requested to stay in room for treatment due to fatigue. All therex performed seated. Pulse ranged 121-132 and O2 72-90 on 10L 02.   Therex: LAQ 2x10, ISO LAQ with ankle pump x 15, ISO LAQ with hip abduction/adduction x 20, seated clam shell x 15 with glute set. All performed with proper muscle facilitation.  Pt left in recliner, no complaints of pain, all needs within reach.      Therapy Documentation Precautions:  Precautions Precautions: Fall, Other (comment) Recall of Precautions/Restrictions: Intact Precaution/Restrictions Comments: Monitor O2. Goal: 88% or greater on 6L Restrictions Weight Bearing Restrictions Per Provider Order: No   Therapy/Group: Individual Therapy  Jeannene Milling 05/16/2023, 7:38 AM

## 2023-05-16 NOTE — Progress Notes (Signed)
 Physical Therapy Session Note  Patient Details  Name: Philip [name] MRN: [id number] Date of Birth: [birth year]  Today's Date: 05/16/2023 PT Individual Time: 0855-1015 PT Individual Time Calculation (min): 80 min   Short Term Goals: Week 2:  PT Short Term Goal 1 (Week 2): STG=LTG 2/2 ELOS  Skilled Therapeutic Interventions/Progress Updates:      Pt supine in bed upon arrival. Pt agreeable to therapy. Pt denies any pain.   Pt on 10L O2 with SpO2 at 91 HR 113. Pt on 10L of O2 throughout session.    Pt performed bridge while in bed to donn pants, Pt SpO2 decreased to 82, recovered to 90 within 1 min and 93 within 3 min with use of pep flutter valve and rest break.   Supine to sit with supervision, SpO2 decreased to 81, increased to 86 after 1 min and 88 after 2 min.   Of note: pt wearing B LE unna boot, noted significant reduction in B LE edema since this therapist last saw pt. Pt reports pain in B LE is more management with fluid reduction.   Stand pivot transfer bed to [facility] with no AD and CGA/close supervision for safety. Pt SpO2 decreased to 80, increased to 88 within 1 min and 90 within 2 min.    Pt transported dependent in [facility] to ortho gym.   Pt performed stand pivot transfer WC to car, car to [facility] with no AD and supervision, verbal cues provided for sitting and lifting legs into car versus standing on one leg. Pt reports he has 2 car options, one SUV with running board for entry and one sedan. Therapist recommending transportation home in sedan for safety and energy conservation 2/2 safety concerns with running board.   SpO2 decreased to 79 on 10L O2 with stand pivot transfer each time, but increased to 86 within 1 min and 90-91 within 2 min.   Pt ambulated 25 feet with no AD and supervision and +2 for WC follow/management of portable oxygen tank; pt decreased to 76 on 10L O2, required non rebreather intermittently for recovery to 93 with non rebreather, and 88 with 10L nasal cannula  (once non rebreather removed).   Pt coughing intermittently throughout session, and SpO2 decreases to as low as 77, but increased to 88 with seated rest break and use of pep flutter valve/pursed lip breathing.   Pt seated in recliner at end of session with all needs within reach and chair alarm on.      Therapy Documentation Precautions:  Precautions Precautions: Fall, Other (comment) Recall of Precautions/Restrictions: Intact Precaution/Restrictions Comments: Monitor O2. Goal: 88% or greater on 6L Restrictions Weight Bearing Restrictions Per Provider Order: No   Therapy/Group: Individual Therapy  [facility] Jenney Modest, [place], DPT  05/16/2023, 9:01 AM

## 2023-05-17 DIAGNOSIS — M545 Low back pain, unspecified: Secondary | ICD-10-CM

## 2023-05-17 DIAGNOSIS — K5900 Constipation, unspecified: Secondary | ICD-10-CM

## 2023-05-17 DIAGNOSIS — J9601 Acute respiratory failure with hypoxia: Secondary | ICD-10-CM | POA: Diagnosis not present

## 2023-05-17 DIAGNOSIS — E876 Hypokalemia: Secondary | ICD-10-CM | POA: Diagnosis not present

## 2023-05-17 DIAGNOSIS — D72829 Elevated white blood cell count, unspecified: Secondary | ICD-10-CM

## 2023-05-17 LAB — BASIC METABOLIC PANEL WITH GFR
Anion gap: 11 (ref 5–15)
BUN: 22 mg/dL (ref 8–23)
CO2: 34 mmol/L — ABNORMAL HIGH (ref 22–32)
Calcium: 9.1 mg/dL (ref 8.9–10.3)
Chloride: 93 mmol/L — ABNORMAL LOW (ref 98–111)
Creatinine, Ser: 1.2 mg/dL (ref 0.61–1.24)
GFR, Estimated: >60 mL/min (ref >60)
Glucose, Bld: 125 mg/dL — ABNORMAL HIGH (ref 70–99)
Potassium: 4.6 mmol/L (ref 3.5–5.1)
Sodium: 138 mmol/L (ref 135–145)

## 2023-05-17 LAB — MAGNESIUM: Magnesium: 2 mg/dL (ref 1.7–2.4)

## 2023-05-17 LAB — VITAMIN D 25 HYDROXY (VIT D DEFICIENCY, FRACTURES): Vit D, 25-Hydroxy: 34.85 ng/mL (ref 30–100)

## 2023-05-17 MED ORDER — IPRATROPIUM-ALBUTEROL 0.5-2.5 (3) MG/3ML IN SOLN
3.0000 mL | RESPIRATORY_TRACT | Status: DC
  Administered 2023-05-17 (×3): 3 mL via RESPIRATORY_TRACT
  Filled 2023-05-17 (×3): qty 3

## 2023-05-17 MED ORDER — FUROSEMIDE 10 MG/ML IJ SOLN
60.0000 mg | Freq: Four times a day (QID) | INTRAMUSCULAR | Status: AC
  Administered 2023-05-17 (×2): 60 mg via INTRAVENOUS
  Filled 2023-05-17 (×2): qty 6

## 2023-05-17 NOTE — Progress Notes (Signed)
 Occupational Therapy Session Note  Patient Details  Name: BASIR NIVEN MRN: [id number] Date of Birth: 17-Feb-1955  {CHL IP REHAB OT TIME CALCULATIONS:[id number]}   Short Term Goals: Week 2:  OT Short Term Goal 1 (Week 2): pt will maintain sats using compensatory strategies as needed during 1 ADL task OT Short Term Goal 2 (Week 2): pt will tolerate standing for 30 secs while maintaining sats using compensatory strategies as needed  Skilled Therapeutic Interventions/Progress Updates:    Patient agreeable to participate in OT session. Reports *** pain level.   Patient participated in skilled OT session focusing on ***. Therapist facilitated/assessed/developed/educated/integrated/elicited *** in order to improve/facilitate/promote    Therapy Documentation Precautions:  Precautions Precautions: Fall, Other (comment) Recall of Precautions/Restrictions: Intact Precaution/Restrictions Comments: Monitor O2. Goal: 88% or greater on 6L Restrictions Weight Bearing Restrictions Per Provider Order: No  Therapy/Group: Individual Therapy  Carollee Circle, OTR/L,CBIS  Supplemental OT - [facility] and [facility] Secure Chat Preferred   05/17/2023, 10:23 PM

## 2023-05-17 NOTE — Progress Notes (Signed)
   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   Has had incr O2 requirements in rehab, desats with exertion, is slow to recoup.  There are concerns regarding home O2 options.  PCCM is consulted in this setting  Admits to 20 lb (!!) weight gain in past week and increased leg swelling  Pertinent  Medical History  Chronic hypoxic resp failure IPF Lung cancer Bronchiectasis COPD GERD MAI  PE  Chronic AC (xarelto)   Significant Hospital Events: Including procedures, antibiotic start and stop dates in addition to other pertinent events     Interim History / Subjective:  Fair UOP Tolerated CPAP Objective   Blood pressure 95/69, pulse (!) 112, temperature 98.1 F (36.7 C), temperature source Oral, resp. rate 18, height 5\' 7"  (1.702 m), weight 94.3 kg, SpO2 93%.        Intake/Output Summary (Last 24 hours) at 05/17/2023 1218 Last data filed at 05/17/2023 4098 Gross per 24 hour  Intake 649 ml  Output 1900 ml  Net -1251 ml   Filed Weights   05/14/23 0500 05/15/23 0500 05/17/23 0439  Weight: 93.4 kg 93 kg 94.3 kg   Physical Exam: General: Well-appearing, no acute distress HENT: [place], AT Eyes: EOMI, no scleral icterus Respiratory: Improved bibasilar crackles Cardiovascular: RRR, -M/R/G, no JVD Extremities:-Edema,-tenderness Neuro: AAO x4, CNII-XII grossly intact Psych: Normal mood, normal affect   BMET acceptable  Resolved Hospital Problem list   N/A  Assessment & Plan:   Acute on chronic cor pulmonale- has all  the hallmarks of RV failure and his echo shows it also although read as normal.  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  CT also suggestive of tracheobronchomalacia.  Cannot r/o group IV but already on AC.  Good UOP but decreased compared to prior days Remains on 10 L O2 Plan Continue BID diuresis. May need to consider increased lasix if UOP continues to drop off Replete potassium Continue Unna boots Continue CPAP nightly   Best Practice (right click and "Reselect all SmartList Selections" daily)   Care Time: 35 min  Genetta Kenning, M.D. [facility] Pulmonary/Critical Care Medicine 05/17/2023 12:18 PM   See Amion for personal pager For hours between 7 PM to 7 AM, please call Elink for urgent questions

## 2023-05-17 NOTE — Progress Notes (Signed)
 Orthopedic Tech Progress Note Patient Details:  Philip [name] [birth year] [id number]  Ortho Devices Type of Ortho Device: Ace wrap, Unna boot Ortho Device/Splint Location: Bilateral lower extremity Ortho Device/Splint Interventions: Ordered, Application, Adjustment   Post Interventions Patient Tolerated: Well Instructions Provided: Care of device  [name] 05/17/2023, 11:26 AM

## 2023-05-17 NOTE — Progress Notes (Signed)
 PROGRESS NOTE   Subjective/Complaints: Patient reports he tolerated CPAP better last couple nights.  Reports he is lost a lot of weight from diuretics.  LBM yesterday   ROS: Patient denies fever, rash, sore throat, blurred vision, dizziness, nausea, vomiting, diarrhea, cough,   chest pain,  headache, or mood change.  + shortness of breath-a little bit improved + anxiety  Constipation- improved   Objective:   No results found.  No results for input(s): "WBC", "HGB", "HCT", "PLT" in the last 72 hours.   Recent Labs    05/15/23 1307 05/17/23 0537  NA 136 138  K 4.0 4.6  CL 92* 93*  CO2 33* 34*  GLUCOSE 123* 125*  BUN 18 22  CREATININE 1.20 1.20  CALCIUM 9.1 9.1    Intake/Output Summary (Last 24 hours) at 05/17/2023 1721 Last data filed at 05/17/2023 1412 Gross per 24 hour  Intake 889 ml  Output 1850 ml  Net -961 ml        Physical Exam: Vital Signs Blood pressure 108/86, pulse (!) 115, temperature 97.9 F (36.6 C), resp. rate 18, height 5\' 7"  (1.702 m), weight 94.3 kg, SpO2 93%.   Constitutional: No distress . Vital signs reviewed.  Laying in bed HEENT: NCAT, EOMI, oral membranes moist  Neck: supple Cardiovascular: Tachycardia Respiratory/Chest: CTA Bilaterally, Normal effort  .[place] on 10L O2 [place] GI/Abdomen: BS +, non-tender, non-distended Ext: no clubbing, cyanosis, 1+ edema b/l LE  Psych: pleasant and cooperative  GU: dark urine in urinal Skin: Bruising noted on bilateral upper extremities, right upper extremity IV looks okay Neuro: Alert and oriented x 4, no apparent cognitive deficits, cranial nerves II through XII grossly intact other than hard of hearing-chronic   MOTOR: RUE: 5/5 Deltoid, 5/5 Biceps, 5/5 Triceps,5/5 Grip LUE: 5/5 Deltoid, 5/5 Biceps, 5/5 Triceps, 5/5 Grip RLE: HF 5/5, KE 5/5, ADF 5/5, APF 5/5 LLE: HF 5/5, KE 5/5, ADF 5/5, APF 5/5, stable 4/14     SENSORY: Normal to touch all  4 extremities although altered in distal lower extremities length dependent distribution Prior neuro assessment is c/w today's exam 05/17/2023.     Assessment/Plan: 1. Functional deficits which require 3+ hours per day of interdisciplinary therapy in a comprehensive inpatient rehab setting. Physiatrist is providing close team supervision and 24 hour management of active medical problems listed below. Physiatrist and rehab team continue to assess barriers to discharge/monitor patient progress toward functional and medical goals  Care Tool:  Bathing    Body parts bathed by patient: Right arm, Left arm, Chest, Abdomen, Front perineal area, Buttocks, Right upper leg, Left upper leg, Right lower leg, Left lower leg         Bathing assist Assist Level: Minimal Assistance - Patient > 75%     Upper Body Dressing/Undressing Upper body dressing   What is the patient wearing?: Pull over shirt    Upper body assist Assist Level: Set up assist    Lower Body Dressing/Undressing Lower body dressing      What is the patient wearing?: Underwear/pull up, Pants     Lower body assist Assist for lower body dressing: Maximal Assistance - Patient 25 - 49%  Toileting Toileting    Toileting assist Assist for toileting: Minimal Assistance - Patient > 75%     Transfers Chair/bed transfer  Transfers assist     Chair/bed transfer assist level: Supervision/Verbal cueing     Locomotion Ambulation   Ambulation assist   Ambulation activity did not occur: Safety/medical concerns  Assist level: Supervision/Verbal cueing Assistive device: Other (comment) (use of oxygen tank as AD) Max distance: 40   Walk 10 feet activity   Assist  Walk 10 feet activity did not occur: Safety/medical concerns  Assist level: Supervision/Verbal cueing Assistive device: Other (comment) (use of oxygen tank with close supervision and close WC follow)   Walk 50 feet activity   Assist Walk 50 feet  with 2 turns activity did not occur: Safety/medical concerns         Walk 150 feet activity   Assist Walk 150 feet activity did not occur: Safety/medical concerns         Walk 10 feet on uneven surface  activity   Assist Walk 10 feet on uneven surfaces activity did not occur: Safety/medical concerns         Wheelchair     Assist Is the patient using a wheelchair?: Yes Type of Wheelchair: Manual    Wheelchair assist level: Dependent - Patient 0%      Wheelchair 50 feet with 2 turns activity    Assist        Assist Level: Dependent - Patient 0%   Wheelchair 150 feet activity     Assist      Assist Level: Dependent - Patient 0%   Blood pressure 108/86, pulse (!) 115, temperature 97.9 F (36.6 C), resp. rate 18, height 5\' 7"  (1.702 m), weight 94.3 kg, SpO2 93%.  Medical Problem List and Plan: 1. Functional deficits secondary to acute respiratory distress/acute on chronic hypoxia/Pseudomonas pneumonia.  Prednisone taper.  Patient with chronic O2.             -Patient may shower             -ELOS/Goals: 10 to 12 days, PT OT mod I             -Continue CIR therapies including PT, OT   -15/7 Schedule   -Lasix 20mg  daily started- , Cr 1.09 recheck Monday -Hold lasix tomorrow, check labs, consider restating if need -Exp DC 4/17 -CXR stable 05/12/23  -Will ask nursing to keep close eye on him tonight, should help with his anxiety too   2.  Antithrombotics: -DVT/anticoagulation:  Pharmaceutical: Xarelto for history of pulmonary emboli             -antiplatelet therapy: N/A 3. Pain Management: Oxycodone as needed             -Hx of chronic lower back pain, PDMP indicates norco 5 BID PRN  - 4/5 pain controlled, not frequently using as needed  -4/8 Schedule hydrocodone 5mg  BID 7pm and 7am  -4/9 pt reports improvement with scheduled medication  -4/14 pain overall controlled, continue current medications, using oxycodone as needed about once a day 4.  Mood/Behavior/Sleep: Provide emotional support             -antipsychotic agents: N/A 5. Neuropsych/cognition: This patient is capable of making decisions on his own behalf. 6. Skin/Wound Care: Routine skin checks 7. Fluids/Electrolytes/Nutrition: Routine in and outs with follow-up chemistries 8.  History of recurrent non-small cell lung cancer.  Previously received chemotherapy and immunotherapy in 2020 and 2022.  Followed  outpatient by pulmonary team as well as oncology services Dr. Marlene Simas 9.  History of radiation-induced IPF.  Continue bronchodilators and steroid as directed 10.  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  Followed outpatient by urology services Dr. Freddi Jaeger.  Continue Flomax             -Reports dark urine at baseline  -4/7-8 continent, reports at baseline  -4/9 Discussed with urology, will check U/A culture. Dark/blood tinged urine may be related to his overdue for replacement ureteral stent. As pt mentioned likely limited on options for procedural options due to pulmonary status. Could consider Pyridium  -4/10 UA with bacteria, WBC 10-20, but no nitrites, he does have burning on urination but it sounds like this is more subacute/chronic.  Will continue to monitor cultures for now  4/11 insignificant growth on urine culture, consider Pyridium if discomfort worsens 11.  Tingling bilateral feet/neuropathy-chronic             -Potentially paraneoplastic, continue to monitor  12.  Leukocytosis  -4/3 WBC within normal limits today  4/7 resolved  Recheck CBC tomorrow 13. Constipation   -4/3 LBM 4/1, patient to try suppository today, continue MiraLAX and Senokot.  Add sorbitol as needed  -4/4 LBM today, large continue to monitor  -LBM 4/6, consider additional medication if NO bm today, denies feeling constipated  -4/9 order storbitol   4/10 LBM yesterday, Continue current regimen, discussed sorbitol PRN   LMB 4/13-improved 14. Lower ext edema:  -4/7  hyperkalemic (5.6) on labs today -ongoing  edema  Filed Weights   05/14/23 0500 05/15/23 0500 05/17/23 0439  Weight: 93.4 kg 93 kg 94.3 kg   -resume lasix 20mg  daily today, check labs tomorrow  -lokelma x 1 today  4/8 Cr. 1.15/BUN 43, continue current regimen. K+ down to 4.1  -Discussed leg elevated, compression stockings  -Improving with lasix  15. Acute on chronic cor pulmonale - Pulmonology consulted appreciate assistance.  continue Unna boots, IV diuretics, BiPAP at bedtime.  No review indicates they are try to get him in Oxymizer.  If organ dysfunction develops they may transfer for IR inotrope treatment. -4/14 tolerating CPAP better  16. Hyperkalemia/Hypokalemia  -BNP 200.8  -K+ 4.6 continue daily labs, currently on supplementation  17. Tachycardia: magnesium supplement added, increase to 500mg     05/17/2023    1:58 PM 05/17/2023   12:08 PM 05/17/2023    9:19 AM  Vitals with BMI  Systolic 108  95  Diastolic 86  69  Pulse 115 112 103    18. Screening for vitamin D deficiency: will check level on Monday  LOS: 12 days A FACE TO FACE EVALUATION WAS PERFORMED  Lylia Sand 05/17/2023, 5:21 PM

## 2023-05-17 NOTE — Progress Notes (Signed)
 Physical Therapy Session Note  Patient Details  Name: Philip [name] MRN: [id number] Date of Birth: 25-Feb-1955  Today's Date: 05/17/2023 PT Individual Time: 1335-1445 PT Individual Time Calculation (min): 70 min   Short Term Goals: Week 2:  PT Short Term Goal 1 (Week 2): STG=LTG 2/2 ELOS  Skilled Therapeutic Interventions/Progress Updates:      Pt seated in recliner upon arrival. Pt agreeable to therapy. Pt denies any pain.   Pt SpO2 at 85 on 10L O2. HR 117 at rest.   Pt SpO2 decreasing to 80 while talking with wife and therapist.   Pt reports improved sleep past few days with use of Bipap.   Discussed pt car options with pt wife as pt wasn't sure of estimated height of SUV with running board entry. Pt wife reports she is shorter than pt and able to sit on seat and bring legs into car with ease (without use of running board).   Pt and pt wife report materials have been purchased for ramp and it will be installed by D/C date scheduled for 4/17.   Pt confirms she will be at home 24/7 to assist pt.   Education provided for pt respiratory impairments and energy conservation techniques for hemodynamic stability and respiratory management. Pt and pt wife verbalize understanding and agreeable.   Pt wife reports home set up is wheelchair accessible with exception of the bathroom which requires pt to walk ~5 feet. Pt wife reports if needed she is able to dependently transport pt in [facility] for energy conservation.   Pt performed sit to stand x2, and stand pivot transfer recliner to [facility] supervision with SpO2 decreasing to as low as 80.   Pt self propelled WC room to west nursing station and back with B UE with continous monitoring with therapist and wife managing portable oxygen tanks--one for nasal cannula, and one for non-rebreather.   Pt SpO2 on 10L ranged from 76-95, unsure of accuracy of monitor reading 2/2 use of B UE for WC propulsion, and monitor at times reading HR and SpO2 at 0.  Therapist monitoring pt symptoms and taking seated rest breaks as needed for SOB. Pt wife massaging pt shoulders during pt rest breaks and pt demos overall improved relaxation and anxiety management with improved O2 saturation.   Pt requesting to stay in [facility] at end of session to allow pt wife to massage pt back and shoulders, therpaist switched O2 from portable tanks to oxygen, and plugged in continous O2 montioring. Notified pt and wife to call for assistance if needed with return to recliner (as pt wife checked off for stand pivot transfer in room). Notified nurse of pt position.          Therapy Documentation Precautions:  Precautions Precautions: Fall, Other (comment) Recall of Precautions/Restrictions: Intact Precaution/Restrictions Comments: Monitor O2. Goal: 88% or greater on 6L Restrictions Weight Bearing Restrictions Per Provider Order: No  Therapy/Group: Individual Therapy  [facility] Scissors, [place], DPT  05/17/2023, 7:59 AM

## 2023-05-18 ENCOUNTER — Inpatient Hospital Stay (HOSPITAL_COMMUNITY)

## 2023-05-18 DIAGNOSIS — R601 Generalized edema: Secondary | ICD-10-CM | POA: Diagnosis not present

## 2023-05-18 DIAGNOSIS — K5900 Constipation, unspecified: Secondary | ICD-10-CM | POA: Diagnosis not present

## 2023-05-18 DIAGNOSIS — J9601 Acute respiratory failure with hypoxia: Secondary | ICD-10-CM | POA: Diagnosis not present

## 2023-05-18 DIAGNOSIS — D72829 Elevated white blood cell count, unspecified: Secondary | ICD-10-CM | POA: Diagnosis not present

## 2023-05-18 LAB — CBC
HCT: 33.6 % — ABNORMAL LOW (ref 39.0–52.0)
Hemoglobin: 10.7 g/dL — ABNORMAL LOW (ref 13.0–17.0)
MCH: 29.3 pg (ref 26.0–34.0)
MCHC: 31.8 g/dL (ref 30.0–36.0)
MCV: 92.1 fL (ref 80.0–100.0)
Platelets: 255 10*3/uL (ref 150–400)
RBC: 3.65 MIL/uL — ABNORMAL LOW (ref 4.22–5.81)
RDW: 15 % (ref 11.5–15.5)
WBC: 6.9 10*3/uL (ref 4.0–10.5)
nRBC: 0 % (ref 0.0–0.2)

## 2023-05-18 LAB — BASIC METABOLIC PANEL WITH GFR
Anion gap: 10 (ref 5–15)
BUN: 18 mg/dL (ref 8–23)
CO2: 34 mmol/L — ABNORMAL HIGH (ref 22–32)
Calcium: 9.1 mg/dL (ref 8.9–10.3)
Chloride: 93 mmol/L — ABNORMAL LOW (ref 98–111)
Creatinine, Ser: 1.29 mg/dL — ABNORMAL HIGH (ref 0.61–1.24)
GFR, Estimated: >60 mL/min (ref >60)
Glucose, Bld: 115 mg/dL — ABNORMAL HIGH (ref 70–99)
Potassium: 4.2 mmol/L (ref 3.5–5.1)
Sodium: 137 mmol/L (ref 135–145)

## 2023-05-18 LAB — MAGNESIUM: Magnesium: 2.1 mg/dL (ref 1.7–2.4)

## 2023-05-18 MED ORDER — BUMETANIDE 0.25 MG/ML IJ SOLN
2.0000 mg | Freq: Once | INTRAMUSCULAR | Status: AC
  Administered 2023-05-18: 2 mg via INTRAVENOUS
  Filled 2023-05-18: qty 8

## 2023-05-18 MED ORDER — IPRATROPIUM-ALBUTEROL 0.5-2.5 (3) MG/3ML IN SOLN
3.0000 mL | Freq: Two times a day (BID) | RESPIRATORY_TRACT | Status: DC
  Administered 2023-05-18 – 2023-05-19 (×3): 3 mL via RESPIRATORY_TRACT
  Filled 2023-05-18 (×3): qty 3

## 2023-05-18 NOTE — Progress Notes (Signed)
 Physical Therapy Session Note  Patient Details  Name: Philip [name] MRN: [id number] Date of Birth: August 22, 1955  Today's Date: 05/18/2023 PT Individual Time: 0935-1003 PT Individual Time Calculation (min): 28 min   Short Term Goals: Week 2:  PT Short Term Goal 1 (Week 2): STG=LTG 2/2 ELOS  Skilled Therapeutic Interventions/Progress Updates: Patient supine in bed (HOB elevated) on entrance to room. Patient alert and agreeable to PT session.   Patient with no complaints of pain. Pt on 11L O2 (nasal cannula hi-flow) on entry to room at 86-89% SpO2. Pt performed sit to stand and transfers without AD and with close supervision for safety throughout session. Pt used acapella when self reporting desat in SpO2 throughout session, and only needed to use secondary tank with non-re-breather once following first round of therex noted bellow.  Desat to 78% and rebounded with secondary O2 (following first round of step to - pt performed 1.5 steps to pilate board and required seated rest) Desat to 74% and rebounded without secondary O2 (after seated rest following step to therex- Pt performed x 2 B LE)  Patient sitting in recliner at end of session with brakes locked, chair alarm set, and all needs within reach.      Therapy Documentation Precautions:  Precautions Precautions: Fall, Other (comment) Recall of Precautions/Restrictions: Intact Precaution/Restrictions Comments: Monitor O2. Goal: 88% or greater on 6L Restrictions Weight Bearing Restrictions Per Provider Order: No   Therapy/Group: Individual Therapy  Aiko Belko PTA 05/18/2023, 12:42 PM

## 2023-05-18 NOTE — Progress Notes (Signed)
   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   Has had incr O2 requirements in rehab, desats with exertion, is slow to recoup.  There are concerns regarding home O2 options.  PCCM is consulted in this setting  Admits to 20 lb (!!) weight gain in past week and increased leg swelling  Pertinent  Medical History  Chronic hypoxic resp failure IPF Lung cancer Bronchiectasis COPD GERD MAI  PE  Chronic AC (xarelto)   Significant Hospital Events: Including procedures, antibiotic start and stop dates in addition to other pertinent events     Interim History / Subjective:  UOP 950 cc, decreased compared to prior Remains on 10-11L Objective   Blood pressure 103/77, pulse (!) 112, temperature 98.2 F (36.8 C), temperature source Oral, resp. rate 19, height 5\' 7"  (1.702 m), weight 94.6 kg, SpO2 95%.    Pressure Support:  [10 cmH20] 10 cmH20   Intake/Output Summary (Last 24 hours) at 05/18/2023 1308 Last data filed at 05/18/2023 1257 Gross per 24 hour  Intake 720 ml  Output 950 ml  Net -230 ml   Filed Weights   05/15/23 0500 05/17/23 0439 05/18/23 0500  Weight: 93 kg 94.3 kg 94.6 kg   Physical Exam: General: Well-appearing, no acute distress HENT: [place], AT Eyes: EOMI, no scleral icterus Respiratory: Bibasilar crackles.  No crackles, wheezing or rales Cardiovascular: RRR, -M/R/G, no JVD Extremities: LE wrapped,-tenderness Neuro: AAO x4, CNII-XII grossly intact Psych: Normal mood, normal affect   Cr slightly increased  from 1.2 to 1.29  Resolved Hospital Problem list   N/A  Assessment & Plan:   Acute on chronic cor pulmonale- has all the hallmarks of RV failure and his echo shows it also although read as normal.  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  CT also suggestive of tracheobronchomalacia.  Cannot r/o group IV but already on AC.  Decreased UOP on lasix Remains on 10-11L O2 Plan Trial Bumex 2 mg once Replete potassium Continue Unna boots Continue CPAP nightly   Best Practice (right click and "Reselect all SmartList Selections" daily)   Care Time: 36 min  Genetta Kenning, M.D. [facility] Pulmonary/Critical Care Medicine 05/18/2023 1:08 PM   See Amion for personal pager For hours between 7 PM to 7 AM, please call Elink for urgent questions

## 2023-05-18 NOTE — Progress Notes (Signed)
 Occupational Therapy Session Note  Patient Details  Name: TOBE KERVIN MRN: [id number] Date of Birth: Jan 04, 1956  {CHL IP REHAB OT TIME CALCULATIONS:[id number]}   Short Term Goals: Week 2:  OT Short Term Goal 1 (Week 2): pt will maintain sats using compensatory strategies as needed during 1 ADL task OT Short Term Goal 2 (Week 2): pt will tolerate standing for 30 secs while maintaining sats using compensatory strategies as needed  Skilled Therapeutic Interventions/Progress Updates:    Patient agreeable to participate in OT session. Reports *** pain level.   Patient participated in skilled OT session focusing on ***. Therapist facilitated/assessed/developed/educated/integrated/elicited *** in order to improve/facilitate/promote    Therapy Documentation Precautions:  Precautions Precautions: Fall, Other (comment) Recall of Precautions/Restrictions: Intact Precaution/Restrictions Comments: Monitor O2. Goal: 88% or greater on 6L Restrictions Weight Bearing Restrictions Per Provider Order: No    Therapy/Group: Individual Therapy  Carollee Circle, OTR/L,CBIS  Supplemental OT - [facility] and [facility] Secure Chat Preferred   05/18/2023, 5:28 PM

## 2023-05-18 NOTE — Progress Notes (Addendum)
 PROGRESS NOTE   Subjective/Complaints: Continues to require high levels of O2 supplementation.  Reports that he was able to tolerate CPAP portion of the night, did notice that his O2 sats were doing better in the morning after using it.   ROS: Patient denies fever, rash, sore throat, blurred vision, dizziness, nausea, vomiting, diarrhea, cough,   chest pain,  headache, or mood change.  + shortness of breath-continue + anxiety  Constipation- improved   Objective:   No results found.  Recent Labs    05/18/23 0519  WBC 6.9  HGB 10.7*  HCT 33.6*  PLT 255     Recent Labs    05/17/23 0537 05/18/23 0519  NA 138 137  K 4.6 4.2  CL 93* 93*  CO2 34* 34*  GLUCOSE 125* 115*  BUN 22 18  CREATININE 1.20 1.29*  CALCIUM 9.1 9.1    Intake/Output Summary (Last 24 hours) at 05/18/2023 1306 Last data filed at 05/18/2023 1257 Gross per 24 hour  Intake 720 ml  Output 950 ml  Net -230 ml        Physical Exam: Vital Signs Blood pressure 103/77, pulse (!) 112, temperature 98.2 F (36.8 C), temperature source Oral, resp. rate 19, height 5\' 7"  (1.702 m), weight 94.6 kg, SpO2 95%.   Constitutional: No distress . Vital signs reviewed.  Laying in bed HEENT: NCAT, EOMI, oral membranes moist  Neck: supple Cardiovascular: Tachycardia Respiratory/Chest: CTA Bilaterally, Normal effort  .[place] on 11L O2 [place] GI/Abdomen: BS +, non-tender, non-distended Ext: no clubbing, cyanosis, trace edema b/l LE  Psych: pleasant and cooperative  GU: urine not seen today Skin: Bruising noted on bilateral upper extremities, right upper extremity IV looks okay Neuro: Alert and oriented x 4, no apparent cognitive deficits, cranial nerves II through XII grossly intact other than hard of hearing-chronic   MOTOR: RUE: 5/5 Deltoid, 5/5 Biceps, 5/5 Triceps,5/5 Grip LUE: 5/5 Deltoid, 5/5 Biceps, 5/5 Triceps, 5/5 Grip RLE: HF 5/5, KE 5/5, ADF 5/5, APF  5/5 LLE: HF 5/5, KE 5/5, ADF 5/5, APF 5/5     SENSORY: Normal to touch all 4 extremities although altered in distal lower extremities length dependent distribution Prior neuro assessment is c/w today's exam 05/18/2023.     Assessment/Plan: 1. Functional deficits which require 3+ hours per day of interdisciplinary therapy in a comprehensive inpatient rehab setting. Physiatrist is providing close team supervision and 24 hour management of active medical problems listed below. Physiatrist and rehab team continue to assess barriers to discharge/monitor patient progress toward functional and medical goals  Care Tool:  Bathing    Body parts bathed by patient: Right arm, Left arm, Chest, Abdomen, Front perineal area, Buttocks, Right upper leg, Left upper leg, Right lower leg, Left lower leg         Bathing assist Assist Level: Minimal Assistance - Patient > 75%     Upper Body Dressing/Undressing Upper body dressing   What is the patient wearing?: Pull over shirt    Upper body assist Assist Level: Set up assist    Lower Body Dressing/Undressing Lower body dressing      What is the patient wearing?: Underwear/pull up, Pants  Lower body assist Assist for lower body dressing: Maximal Assistance - Patient 25 - 49%     Toileting Toileting    Toileting assist Assist for toileting: Minimal Assistance - Patient > 75%     Transfers Chair/bed transfer  Transfers assist     Chair/bed transfer assist level: Supervision/Verbal cueing     Locomotion Ambulation   Ambulation assist   Ambulation activity did not occur: Safety/medical concerns  Assist level: Supervision/Verbal cueing Assistive device: Other (comment) (use of oxygen tank as AD) Max distance: 40   Walk 10 feet activity   Assist  Walk 10 feet activity did not occur: Safety/medical concerns  Assist level: Supervision/Verbal cueing Assistive device: Other (comment) (use of oxygen tank with close  supervision and close WC follow)   Walk 50 feet activity   Assist Walk 50 feet with 2 turns activity did not occur: Safety/medical concerns         Walk 150 feet activity   Assist Walk 150 feet activity did not occur: Safety/medical concerns         Walk 10 feet on uneven surface  activity   Assist Walk 10 feet on uneven surfaces activity did not occur: Safety/medical concerns         Wheelchair     Assist Is the patient using a wheelchair?: Yes Type of Wheelchair: Manual    Wheelchair assist level: Dependent - Patient 0%      Wheelchair 50 feet with 2 turns activity    Assist        Assist Level: Dependent - Patient 0%   Wheelchair 150 feet activity     Assist      Assist Level: Dependent - Patient 0%   Blood pressure 103/77, pulse (!) 112, temperature 98.2 F (36.8 C), temperature source Oral, resp. rate 19, height 5\' 7"  (1.702 m), weight 94.6 kg, SpO2 95%.  Medical Problem List and Plan: 1. Functional deficits secondary to acute respiratory distress/acute on chronic hypoxia/Pseudomonas pneumonia.  Prednisone taper.  Patient with chronic O2.             -Patient may shower             -ELOS/Goals: 10 to 12 days, PT OT mod I             -Continue CIR therapies including PT, OT   -15/7 Schedule   -Lasix 20mg  daily started- , Cr 1.09 recheck Monday -Hold lasix tomorrow, check labs, consider restating if need -Exp DC 4/17- may need to be delayed -CXR stable 05/12/23    2.  Antithrombotics: -DVT/anticoagulation:  Pharmaceutical: Xarelto for history of pulmonary emboli             -antiplatelet therapy: N/A 3. Pain Management: Oxycodone as needed             -Hx of chronic lower back pain, PDMP indicates norco 5 BID PRN  - 4/5 pain controlled, not frequently using as needed  -4/8 Schedule hydrocodone 5mg  BID 7pm and 7am  -4/9 pt reports improvement with scheduled medication  -4/14 pain overall controlled, continue current medications,  using oxycodone as needed about once a day  -4/15 pain controlled, did not use as needed oxycodone yesterday 4. Mood/Behavior/Sleep: Provide emotional support             -antipsychotic agents: N/A 5. Neuropsych/cognition: This patient is capable of making decisions on his own behalf. 6. Skin/Wound Care: Routine skin checks 7. Fluids/Electrolytes/Nutrition: Routine in and outs with follow-up  chemistries 8.  History of recurrent non-small cell lung cancer.  Previously received chemotherapy and immunotherapy in 2020 and 2022.  Followed outpatient by pulmonary team as well as oncology services Dr. Marlene Simas 9.  History of radiation-induced IPF.  Continue bronchodilators and steroid as directed 10.  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  Followed outpatient by urology services Dr. Freddi Jaeger.  Continue Flomax             -Reports dark urine at baseline  -4/7-8 continent, reports at baseline  -4/9 Discussed with urology, will check U/A culture. Dark/blood tinged urine may be related to his overdue for replacement ureteral stent. As pt mentioned likely limited on options for procedural options due to pulmonary status. Could consider Pyridium  -4/10 UA with bacteria, WBC 10-20, but no nitrites, he does have burning on urination but it sounds like this is more subacute/chronic.  Will continue to monitor cultures for now  4/11 insignificant growth on urine culture, consider Pyridium if discomfort worsens 11.  Tingling bilateral feet/neuropathy-chronic             -Potentially paraneoplastic, continue to monitor  12.  Leukocytosis  -4/3 WBC within normal limits today  4/7 resolved  4/15 WBC within normal limits at 6.9 13. Constipation   -4/3 LBM 4/1, patient to try suppository today, continue MiraLAX and Senokot.  Add sorbitol as needed  -4/4 LBM today, large continue to monitor  -LBM 4/6, consider additional medication if NO bm today, denies feeling constipated  -4/9 order storbitol    4/10 LBM yesterday, Continue current regimen, discussed sorbitol PRN   4/15 reports LBM yesterday, continue to monitor  14. Lower ext edema:  -4/7 hyperkalemic (5.6) on labs today -ongoing  edema  Filed Weights   05/15/23 0500 05/17/23 0439 05/18/23 0500  Weight: 93 kg 94.3 kg 94.6 kg   -resume lasix 20mg  daily today, check labs tomorrow  -lokelma x 1 today  4/8 Cr. 1.15/BUN 43, continue current regimen. K+ down to 4.1  -Discussed leg elevated, compression stockings  -Improved with IV lasix  -4/15 Much improved, appears he has had bumex ordered today by critical care. Cr a little higher at 1.29  15. Acute on chronic cor pulmonale - Pulmonology consulted appreciate assistance.  continue Unna boots, IV diuretics, BiPAP at bedtime.  No review indicates they are try to get him in Oxymizer.  If organ dysfunction develops they may transfer for IR inotrope treatment. -4/14 tolerating CPAP better -4/15 appears benefit with CPAP, CXR has been ordered by critical care team 16. Hyperkalemia/Hypokalemia  -BNP 200.8  -K+ 4.2, continue supplementation and daily labs  17. Tachycardia: magnesium supplement added, increase to 500mg     05/18/2023    9:29 AM 05/18/2023    5:29 AM 05/18/2023    5:00 AM  Vitals with BMI  Weight   208 lbs 9 oz  BMI   32.66  Systolic 103 119   Diastolic 77 84   Pulse 112 107     18. Screening for vitamin D deficiency: will check level on Monday  -Vit D -34.85  LOS: 13 days A FACE TO FACE EVALUATION WAS PERFORMED  Lylia Sand 05/18/2023, 1:06 PM

## 2023-05-18 NOTE — Progress Notes (Signed)
 Physical Therapy Session Note  Patient Details  Name: Philip [name] MRN: [id number] Date of Birth: 10/31/1955  Today's Date: 05/18/2023 PT Individual Time: 8657-8469 PT Individual Time Calculation (min): 57 min   Short Term Goals: Week 2:  PT Short Term Goal 1 (Week 2): STG=LTG 2/2 ELOS  Skilled Therapeutic Interventions/Progress Updates:      Pt receiving personal care from nurse on [facility] upon arrival. Pt HR as high as 146, SpO2 on 76 with use of 10L nasal cannula and 10 L non rebreather  Pt seated in recliner and performed seated rest break with non rebreather and SpO2 increased to 93. Pt then utilized 10L O2 and use of pep flutter valve to maintain SpO2 86 and above. HR decreased to 120.   Pt expressed concerns about discharging on 4/17 regarding pt medical/respiratory status. Education provided regarding team conference tomorrow with IDT to discuss CLOF, D/C date and barriers to D/C.   Discussed pt montioring HR and SpO2 at home. Pt reports he has his  pulse ox here from home. Pt reports he checks it regularly. Therapist assessed accuracy, and the HR and SpO2 on pulse ox and continous montioring are within 1 of one another while at rest. Recommended pt assess it regularly at home and take rest breaks if SpO2 decreases to 79 or below-with use of pursed lip breathing/pep flutter valve for recovery, and use of mask non re-breather as needed with more rigorous activities such as toileting/showering.   Discussed energy conservation techniques with more taxing activities (ambulation, toileting, showering).   Discussed importance of making sure to regularly check portable oxygen tanks when in use to ensure it isn't empty.   Pt stood and performed upper body stretch for ~30 seconds, prior to requiring seated rest break 2/2 to pt feeling as though he is "crashing" (continous monitoring reading 90) . Pt then asked for non rebreather, donned it, checked spO2 on pulse ox 70 (therpaist confirmed  with continuous monitoring), pt then used non re-breather until recovery to 92, and then removed non rebreather.   Pt requesting to get back in bed at end of session. Pt performed stand pivot transfer recliner to bed with supervision for monitoring SpO2. Pt hyperventilating, requesting non rebreather. Pt SpO2 decreased to as low as 78.   Pt supine in bed with all needs within reach and bed alarm on on 10L and SpO2 at 89.         Therapy Documentation Precautions:  Precautions Precautions: Fall, Other (comment) Recall of Precautions/Restrictions: Intact Precaution/Restrictions Comments: Monitor O2. Goal: 88% or greater on 6L Restrictions Weight Bearing Restrictions Per Provider Order: No  Therapy/Group: Individual Therapy  [facility] Jenney Modest, [place], DPT  05/18/2023, 2:38 PM

## 2023-05-19 ENCOUNTER — Other Ambulatory Visit: Payer: Self-pay

## 2023-05-19 ENCOUNTER — Inpatient Hospital Stay (HOSPITAL_COMMUNITY)
Admission: AD | Admit: 2023-05-19 | Discharge: 2023-05-28 | DRG: 175 | Disposition: A | Discharge status: Home / Self Care | Source: Intra-hospital | Attending: Student | Admitting: Student

## 2023-05-19 ENCOUNTER — Other Ambulatory Visit (HOSPITAL_COMMUNITY): Payer: Self-pay

## 2023-05-19 ENCOUNTER — Encounter: Payer: Self-pay | Admitting: Internal Medicine

## 2023-05-19 ENCOUNTER — Encounter (HOSPITAL_COMMUNITY): Payer: Self-pay | Admitting: Internal Medicine

## 2023-05-19 ENCOUNTER — Encounter: Payer: Self-pay | Admitting: Physician Assistant

## 2023-05-19 ENCOUNTER — Encounter (HOSPITAL_COMMUNITY): Payer: Self-pay

## 2023-05-19 DIAGNOSIS — Z79899 Other long term (current) drug therapy: Secondary | ICD-10-CM

## 2023-05-19 DIAGNOSIS — J440 Chronic obstructive pulmonary disease with acute lower respiratory infection: Secondary | ICD-10-CM | POA: Diagnosis not present

## 2023-05-19 DIAGNOSIS — Z6833 Body mass index (BMI) 33.0-33.9, adult: Secondary | ICD-10-CM | POA: Diagnosis not present

## 2023-05-19 DIAGNOSIS — M546 Pain in thoracic spine: Secondary | ICD-10-CM

## 2023-05-19 DIAGNOSIS — Z87891 Personal history of nicotine dependence: Secondary | ICD-10-CM | POA: Diagnosis not present

## 2023-05-19 DIAGNOSIS — J90 Pleural effusion, not elsewhere classified: Secondary | ICD-10-CM

## 2023-05-19 DIAGNOSIS — J701 Chronic and other pulmonary manifestations due to radiation: Secondary | ICD-10-CM | POA: Diagnosis present

## 2023-05-19 DIAGNOSIS — I50810 Right heart failure, unspecified: Secondary | ICD-10-CM | POA: Diagnosis present

## 2023-05-19 DIAGNOSIS — J810 Acute pulmonary edema: Secondary | ICD-10-CM

## 2023-05-19 DIAGNOSIS — R739 Hyperglycemia, unspecified: Secondary | ICD-10-CM | POA: Diagnosis present

## 2023-05-19 DIAGNOSIS — Y842 Radiological procedure and radiotherapy as the cause of abnormal reaction of the patient, or of later complication, without mention of misadventure at the time of the procedure: Secondary | ICD-10-CM | POA: Diagnosis present

## 2023-05-19 DIAGNOSIS — J9621 Acute and chronic respiratory failure with hypoxia: Secondary | ICD-10-CM | POA: Diagnosis present

## 2023-05-19 DIAGNOSIS — R609 Edema, unspecified: Secondary | ICD-10-CM

## 2023-05-19 DIAGNOSIS — J9601 Acute respiratory failure with hypoxia: Principal | ICD-10-CM | POA: Diagnosis present

## 2023-05-19 DIAGNOSIS — Z86711 Personal history of pulmonary embolism: Secondary | ICD-10-CM | POA: Diagnosis not present

## 2023-05-19 DIAGNOSIS — J449 Chronic obstructive pulmonary disease, unspecified: Secondary | ICD-10-CM | POA: Diagnosis present

## 2023-05-19 DIAGNOSIS — N1831 Chronic kidney disease, stage 3a: Secondary | ICD-10-CM | POA: Diagnosis present

## 2023-05-19 DIAGNOSIS — Z7951 Long term (current) use of inhaled steroids: Secondary | ICD-10-CM

## 2023-05-19 DIAGNOSIS — T380X5A Adverse effect of glucocorticoids and synthetic analogues, initial encounter: Secondary | ICD-10-CM | POA: Diagnosis present

## 2023-05-19 DIAGNOSIS — N400 Enlarged prostate without lower urinary tract symptoms: Secondary | ICD-10-CM | POA: Diagnosis present

## 2023-05-19 DIAGNOSIS — Z7952 Long term (current) use of systemic steroids: Secondary | ICD-10-CM | POA: Diagnosis not present

## 2023-05-19 DIAGNOSIS — J439 Emphysema, unspecified: Secondary | ICD-10-CM | POA: Diagnosis present

## 2023-05-19 DIAGNOSIS — C3491 Malignant neoplasm of unspecified part of right bronchus or lung: Secondary | ICD-10-CM | POA: Diagnosis present

## 2023-05-19 DIAGNOSIS — Z9221 Personal history of antineoplastic chemotherapy: Secondary | ICD-10-CM

## 2023-05-19 DIAGNOSIS — Z885 Allergy status to narcotic agent status: Secondary | ICD-10-CM

## 2023-05-19 DIAGNOSIS — I2609 Other pulmonary embolism with acute cor pulmonale: Principal | ICD-10-CM | POA: Diagnosis present

## 2023-05-19 DIAGNOSIS — Z8701 Personal history of pneumonia (recurrent): Secondary | ICD-10-CM

## 2023-05-19 DIAGNOSIS — D638 Anemia in other chronic diseases classified elsewhere: Secondary | ICD-10-CM | POA: Diagnosis present

## 2023-05-19 DIAGNOSIS — Z923 Personal history of irradiation: Secondary | ICD-10-CM | POA: Diagnosis not present

## 2023-05-19 DIAGNOSIS — I2781 Cor pulmonale (chronic): Secondary | ICD-10-CM | POA: Diagnosis not present

## 2023-05-19 DIAGNOSIS — Z7901 Long term (current) use of anticoagulants: Secondary | ICD-10-CM

## 2023-05-19 DIAGNOSIS — G43909 Migraine, unspecified, not intractable, without status migrainosus: Secondary | ICD-10-CM | POA: Diagnosis present

## 2023-05-19 DIAGNOSIS — K5900 Constipation, unspecified: Secondary | ICD-10-CM | POA: Diagnosis not present

## 2023-05-19 DIAGNOSIS — E785 Hyperlipidemia, unspecified: Secondary | ICD-10-CM | POA: Diagnosis present

## 2023-05-19 DIAGNOSIS — E66811 Obesity, class 1: Secondary | ICD-10-CM | POA: Diagnosis present

## 2023-05-19 DIAGNOSIS — K219 Gastro-esophageal reflux disease without esophagitis: Secondary | ICD-10-CM | POA: Diagnosis present

## 2023-05-19 DIAGNOSIS — Z888 Allergy status to other drugs, medicaments and biological substances status: Secondary | ICD-10-CM

## 2023-05-19 LAB — BASIC METABOLIC PANEL WITH GFR
Anion gap: 10 (ref 5–15)
BUN: 20 mg/dL (ref 8–23)
CO2: 33 mmol/L — ABNORMAL HIGH (ref 22–32)
Calcium: 9.3 mg/dL (ref 8.9–10.3)
Chloride: 94 mmol/L — ABNORMAL LOW (ref 98–111)
Creatinine, Ser: 1.26 mg/dL — ABNORMAL HIGH (ref 0.61–1.24)
GFR, Estimated: >60 mL/min (ref >60)
Glucose, Bld: 125 mg/dL — ABNORMAL HIGH (ref 70–99)
Potassium: 4.5 mmol/L (ref 3.5–5.1)
Sodium: 137 mmol/L (ref 135–145)

## 2023-05-19 MED ORDER — MAGNESIUM OXIDE 400 MG PO TABS
400.0000 mg | ORAL_TABLET | Freq: Every day | ORAL | 0 refills | Status: AC
  Filled 2023-05-19: qty 30, 30d supply, fill #0

## 2023-05-19 MED ORDER — HYDRALAZINE HCL 20 MG/ML IJ SOLN: 5.0000 mg | INTRAMUSCULAR | Status: DC | PRN

## 2023-05-19 MED ORDER — PREDNISONE 20 MG PO TABS: 60.0000 mg | ORAL_TABLET | Freq: Every day | ORAL | Status: DC

## 2023-05-19 MED ORDER — SENNOSIDES-DOCUSATE SODIUM 8.6-50 MG PO TABS: 1.0000 | ORAL_TABLET | Freq: Every day | ORAL | Status: DC

## 2023-05-19 MED ORDER — ALBUTEROL SULFATE HFA 108 (90 BASE) MCG/ACT IN AERS
2.0000 | INHALATION_SPRAY | RESPIRATORY_TRACT | 0 refills | Status: DC | PRN
  Filled 2023-05-19: qty 18, 17d supply, fill #0

## 2023-05-19 MED ORDER — HYDROCODONE-ACETAMINOPHEN 5-325 MG PO TABS
1.0000 | ORAL_TABLET | Freq: Two times a day (BID) | ORAL | 0 refills | Status: AC | PRN
  Filled 2023-05-19: qty 30, 15d supply, fill #0

## 2023-05-19 MED ORDER — GUAIFENESIN ER 600 MG PO TB12
600.0000 mg | ORAL_TABLET | Freq: Two times a day (BID) | ORAL | 0 refills | Status: AC
  Filled 2023-05-19: qty 60, 30d supply, fill #0

## 2023-05-19 MED ORDER — SODIUM CHLORIDE 0.9 % IV SOLN: 250.0000 mL | INTRAVENOUS | Status: AC | PRN

## 2023-05-19 MED ORDER — BUDESONIDE 0.5 MG/2ML IN SUSP
0.5000 mg | Freq: Two times a day (BID) | RESPIRATORY_TRACT | Status: DC
  Administered 2023-05-19 – 2023-05-28 (×18): 0.5 mg via RESPIRATORY_TRACT
  Filled 2023-05-19 (×18): qty 2

## 2023-05-19 MED ORDER — POTASSIUM CHLORIDE CRYS ER 20 MEQ PO TBCR: 40.0000 meq | EXTENDED_RELEASE_TABLET | Freq: Two times a day (BID) | ORAL | Status: DC

## 2023-05-19 MED ORDER — SORBITOL 70 % SOLN: 30.0000 mL | Freq: Every day | Status: DC | PRN

## 2023-05-19 MED ORDER — IPRATROPIUM-ALBUTEROL 0.5-2.5 (3) MG/3ML IN SOLN
3.0000 mL | RESPIRATORY_TRACT | Status: DC | PRN
  Administered 2023-05-19: 3 mL via RESPIRATORY_TRACT

## 2023-05-19 MED ORDER — ONDANSETRON HCL 4 MG PO TABS: 4.0000 mg | ORAL_TABLET | Freq: Four times a day (QID) | ORAL | Status: DC | PRN

## 2023-05-19 MED ORDER — IPRATROPIUM-ALBUTEROL 0.5-2.5 (3) MG/3ML IN SOLN
3.0000 mL | Freq: Two times a day (BID) | RESPIRATORY_TRACT | 0 refills | Status: DC
  Filled 2023-05-19: qty 360, 60d supply, fill #0

## 2023-05-19 MED ORDER — ALUM & MAG HYDROXIDE-SIMETH 200-200-20 MG/5ML PO SUSP
15.0000 mL | ORAL | Status: DC | PRN
  Administered 2023-05-22 – 2023-05-24 (×5): 15 mL via ORAL
  Filled 2023-05-19 (×5): qty 30

## 2023-05-19 MED ORDER — BUMETANIDE 0.25 MG/ML IJ SOLN
2.0000 mg | Freq: Once | INTRAMUSCULAR | Status: AC
  Administered 2023-05-19: 2 mg via INTRAVENOUS
  Filled 2023-05-19: qty 8

## 2023-05-19 MED ORDER — BISACODYL 10 MG RE SUPP: 10.0000 mg | Freq: Every day | RECTAL | Status: DC | PRN

## 2023-05-19 MED ORDER — BUDESONIDE 0.5 MG/2ML IN SUSP
0.5000 mg | Freq: Two times a day (BID) | RESPIRATORY_TRACT | 12 refills | Status: DC | PRN
  Filled 2023-05-19: qty 120, 30d supply, fill #0

## 2023-05-19 MED ORDER — POLYETHYLENE GLYCOL 3350 17 G PO PACK: 17.0000 g | PACK | Freq: Two times a day (BID) | ORAL | Status: DC

## 2023-05-19 MED ORDER — IBUPROFEN 200 MG PO TABS: 400.0000 mg | ORAL_TABLET | Freq: Four times a day (QID) | ORAL | Status: DC | PRN

## 2023-05-19 MED ORDER — ARFORMOTEROL TARTRATE 15 MCG/2ML IN NEBU
15.0000 ug | INHALATION_SOLUTION | Freq: Two times a day (BID) | RESPIRATORY_TRACT | Status: DC
  Administered 2023-05-19 – 2023-05-28 (×18): 15 ug via RESPIRATORY_TRACT
  Filled 2023-05-19 (×18): qty 2

## 2023-05-19 MED ORDER — RIVAROXABAN 20 MG PO TABS
20.0000 mg | ORAL_TABLET | Freq: Every day | ORAL | Status: DC
  Administered 2023-05-19 – 2023-05-27 (×9): 20 mg via ORAL
  Filled 2023-05-19 (×9): qty 1

## 2023-05-19 MED ORDER — RIVAROXABAN 20 MG PO TABS
20.0000 mg | ORAL_TABLET | Freq: Every day | ORAL | 0 refills | Status: AC
  Filled 2023-05-19: qty 30, 30d supply, fill #0

## 2023-05-19 MED ORDER — TAMSULOSIN HCL 0.4 MG PO CAPS
0.4000 mg | ORAL_CAPSULE | Freq: Every day | ORAL | 0 refills | Status: AC
  Filled 2023-05-19: qty 30, 30d supply, fill #0

## 2023-05-19 MED ORDER — BIOTENE DRY MOUTH MT LIQD: 15.0000 mL | OROMUCOSAL | Status: DC | PRN

## 2023-05-19 MED ORDER — HYDROCODONE-ACETAMINOPHEN 5-325 MG PO TABS: 1.0000 | ORAL_TABLET | Freq: Two times a day (BID) | ORAL | Status: DC

## 2023-05-19 MED ORDER — MENTHOL 3 MG MT LOZG: 1.0000 | LOZENGE | OROMUCOSAL | Status: DC | PRN

## 2023-05-19 MED ORDER — GUAIFENESIN-DM 100-10 MG/5ML PO SYRP
5.0000 mL | ORAL_SOLUTION | ORAL | Status: DC | PRN
  Filled 2023-05-19: qty 5

## 2023-05-19 MED ORDER — MAGNESIUM GLUCONATE 500 (27 MG) MG PO TABS: 500.0000 mg | ORAL_TABLET | Freq: Every day | ORAL | Status: DC

## 2023-05-19 MED ORDER — TAMSULOSIN HCL 0.4 MG PO CAPS
0.4000 mg | ORAL_CAPSULE | Freq: Every day | ORAL | Status: DC
  Administered 2023-05-19 – 2023-05-27 (×9): 0.4 mg via ORAL
  Filled 2023-05-19 (×9): qty 1

## 2023-05-19 MED ORDER — ONDANSETRON HCL 4 MG/2ML IJ SOLN: 4.0000 mg | Freq: Four times a day (QID) | INTRAMUSCULAR | Status: DC | PRN

## 2023-05-19 MED ORDER — SENNOSIDES-DOCUSATE SODIUM 8.6-50 MG PO TABS: 1.0000 | ORAL_TABLET | Freq: Every evening | ORAL | Status: DC | PRN

## 2023-05-19 MED ORDER — SODIUM CHLORIDE 0.45 % IV SOLN: INTRAVENOUS | Status: DC

## 2023-05-19 MED ORDER — OXYCODONE HCL 5 MG PO TABS: 7.5000 mg | ORAL_TABLET | ORAL | Status: DC | PRN

## 2023-05-19 MED ORDER — SODIUM CHLORIDE 0.9% FLUSH: 3.0000 mL | Freq: Two times a day (BID) | INTRAVENOUS | Status: DC

## 2023-05-19 MED ORDER — GUAIFENESIN ER 600 MG PO TB12: 600.0000 mg | ORAL_TABLET | Freq: Two times a day (BID) | ORAL | Status: DC

## 2023-05-19 MED ORDER — IPRATROPIUM-ALBUTEROL 0.5-2.5 (3) MG/3ML IN SOLN
3.0000 mL | Freq: Two times a day (BID) | RESPIRATORY_TRACT | Status: DC
  Filled 2023-05-19: qty 3

## 2023-05-19 MED ORDER — SODIUM CHLORIDE 0.9% FLUSH: 3.0000 mL | INTRAVENOUS | Status: DC | PRN

## 2023-05-19 MED ORDER — BREZTRI AEROSPHERE 160-9-4.8 MCG/ACT IN AERO
2.0000 | INHALATION_SPRAY | Freq: Two times a day (BID) | RESPIRATORY_TRACT | 0 refills | Status: DC
  Filled 2023-05-19: qty 10.7, 30d supply, fill #0

## 2023-05-19 NOTE — Progress Notes (Incomplete)
 Inpatient Rehabilitation Discharge Medication Review by a Pharmacist  A complete drug regimen review was completed for this patient to identify any potential clinically significant medication issues.  High Risk Drug Classes Is patient taking? Indication by Medication  Antipsychotic No   Anticoagulant Yes Xarelto - PE  Antibiotic No   Opioid Yes  Norco PRN- moderate pain   Antiplatelet No   Hypoglycemics/insulin No   Vasoactive Medication Yes Tamsulosin - BPH  Chemotherapy No   Other Yes Pulmicort PRN, Duo-nebs PRN, Breztri, albuterol PRN-COPD Guaifenesin- cough  Ibuprofen prn mild pain Mag-ox, MVI with minerals- supplement  Bisacodyl, Senokot-S- bowel regimen  Vicine ophthalmic solution- eye rritation     Type of Medication Issue Identified Description of Issue Recommendation(s)  Drug Interaction(s) (clinically significant)     Duplicate Therapy     Allergy     No Medication Administration End Date     Incorrect Dose     Additional Drug Therapy Needed     Significant med changes from prior encounter (inform family/care partners about these prior to discharge). STOP- morphine solution, Mucinex DM    Other       Clinically significant medication issues were identified that warrant physician communication and completion of prescribed/recommended actions by midnight of the next day:  No Patient started on potassium chloride tablets scheduled on 4/12. Last check 4.2 on 4/15- not continued at d/c- confirmed this was intended with Obed Bellows, PA   Name of provider notified for urgent issues identified:   Provider Method of Notification:     Pharmacist comments: None  Time spent performing this drug regimen review (minutes):  20 minutes  Chrystie Crass, PharmD Clinical Pharmacist  05/19/2023 8:47 AM

## 2023-05-19 NOTE — Progress Notes (Signed)
 PROGRESS NOTE   Subjective/Complaints: Continues to require high levels of O2 supplementation. Had trouble tolerating BIPAP last night. Good diuresis.    ROS: Patient denies fever, rash, sore throat, blurred vision, dizziness, nausea, vomiting, diarrhea, cough,   chest pain,  headache, or mood change.   + shortness of breath-continue + anxiety  Constipation- improved   Objective:   DG CHEST PORT 1 VIEW Result Date: 05/18/2023 CLINICAL DATA:  Hypoxia EXAM: PORTABLE CHEST 1 VIEW COMPARISON:  05/12/2023 FINDINGS: Postoperative changes and volume loss on the right. Elevation of the right hemidiaphragm. Small right pleural effusion. Chronic perihilar and right lower lobe opacities, unchanged. Heart and mediastinal contours are within normal limits. No acute bony abnormality. IMPRESSION: Stable small right pleural effusion with chronic changes throughout the lungs. No significant change. Electronically Signed   By: Janeece Mechanic M.D.   On: 05/18/2023 17:10    Recent Labs    05/18/23 0519  WBC 6.9  HGB 10.7*  HCT 33.6*  PLT 255     Recent Labs    05/17/23 0537 05/18/23 0519  NA 138 137  K 4.6 4.2  CL 93* 93*  CO2 34* 34*  GLUCOSE 125* 115*  BUN 22 18  CREATININE 1.20 1.29*  CALCIUM 9.1 9.1    Intake/Output Summary (Last 24 hours) at 05/19/2023 1251 Last data filed at 05/19/2023 0810 Gross per 24 hour  Intake 360 ml  Output 1530 ml  Net -1170 ml        Physical Exam: Vital Signs Blood pressure 106/74, pulse (!) 110, temperature 98.7 F (37.1 C), temperature source Oral, resp. rate 17, height 5\' 7"  (1.702 m), weight 90.5 kg, SpO2 90%.   Constitutional: No distress . Vital signs reviewed.  Sitting in bedside chair . Appears comfortable HEENT: NCAT, EOMI, oral membranes moist  Neck: supple Cardiovascular: Tachycardia Respiratory/Chest: CTA Bilaterally, Normal effort  .[place] on 11L O2   GI/Abdomen: BS +,  non-tender, non-distended Ext: no clubbing, cyanosis, trace edema b/l LE  Psych: pleasant and cooperative  Skin: Bruising noted on bilateral upper extremities, right upper extremity IV looks okay Neuro: Alert and oriented x 4, no apparent cognitive deficits, cranial nerves II through XII grossly intact other than hard of hearing-chronic   MOTOR: RUE: 5/5 Deltoid, 5/5 Biceps, 5/5 Triceps,5/5 Grip LUE: 5/5 Deltoid, 5/5 Biceps, 5/5 Triceps, 5/5 Grip RLE: HF 5/5, KE 5/5, ADF 5/5, APF 5/5 LLE: HF 5/5, KE 5/5, ADF 5/5, APF 5/5     SENSORY: Normal to touch all 4 extremities although altered in distal lower extremities length dependent distribution Prior neuro assessment is c/w today's exam 05/19/2023.     Assessment/Plan: 1. Functional deficits which require 3+ hours per day of interdisciplinary therapy in a comprehensive inpatient rehab setting. Physiatrist is providing close team supervision and 24 hour management of active medical problems listed below. Physiatrist and rehab team continue to assess barriers to discharge/monitor patient progress toward functional and medical goals  Care Tool:  Bathing    Body parts bathed by patient: Right arm, Left arm, Chest, Abdomen, Front perineal area, Buttocks, Right upper leg, Left upper leg, Right lower leg, Left lower leg  Bathing assist Assist Level: Minimal Assistance - Patient > 75%     Upper Body Dressing/Undressing Upper body dressing   What is the patient wearing?: Pull over shirt    Upper body assist Assist Level: Set up assist    Lower Body Dressing/Undressing Lower body dressing      What is the patient wearing?: Underwear/pull up, Pants     Lower body assist Assist for lower body dressing: Maximal Assistance - Patient 25 - 49%     Toileting Toileting    Toileting assist Assist for toileting: Minimal Assistance - Patient > 75%     Transfers Chair/bed transfer  Transfers assist     Chair/bed transfer  assist level: Supervision/Verbal cueing     Locomotion Ambulation   Ambulation assist   Ambulation activity did not occur: Safety/medical concerns  Assist level: Supervision/Verbal cueing Assistive device: Other (comment) (use of oxygen tank as AD) Max distance: 40   Walk 10 feet activity   Assist  Walk 10 feet activity did not occur: Safety/medical concerns  Assist level: Supervision/Verbal cueing Assistive device: Other (comment) (use of oxygen tank with close supervision and close WC follow)   Walk 50 feet activity   Assist Walk 50 feet with 2 turns activity did not occur: Safety/medical concerns         Walk 150 feet activity   Assist Walk 150 feet activity did not occur: Safety/medical concerns         Walk 10 feet on uneven surface  activity   Assist Walk 10 feet on uneven surfaces activity did not occur: Safety/medical concerns         Wheelchair     Assist Is the patient using a wheelchair?: Yes Type of Wheelchair: Manual    Wheelchair assist level: Dependent - Patient 0%      Wheelchair 50 feet with 2 turns activity    Assist        Assist Level: Dependent - Patient 0%   Wheelchair 150 feet activity     Assist      Assist Level: Dependent - Patient 0%   Blood pressure 106/74, pulse (!) 110, temperature 98.7 F (37.1 C), temperature source Oral, resp. rate 17, height 5\' 7"  (1.702 m), weight 90.5 kg, SpO2 90%.  Medical Problem List and Plan: 1. Functional deficits secondary to acute respiratory distress/acute on chronic hypoxia/Pseudomonas pneumonia.  Prednisone taper.  Patient with chronic O2.             -Patient may shower             -ELOS/Goals: 10 to 12 days, PT OT mod I             -Continue CIR therapies including PT, OT   -15/7 Schedule   -Lasix 20mg  daily started- , Cr 1.09 recheck Monday -CXR stable 05/12/23  -Discussed with pulmonary and hospitalist team, think he would benefit from transfer to acute  care team due to high O2 requirements and increased monitoring 2.  Antithrombotics: -DVT/anticoagulation:  Pharmaceutical: Xarelto for history of pulmonary emboli             -antiplatelet therapy: N/A 3. Pain Management: Oxycodone as needed             -Hx of chronic lower back pain, PDMP indicates norco 5 BID PRN  - 4/5 pain controlled, not frequently using as needed  -4/8 Schedule hydrocodone 5mg  BID 7pm and 7am  -4/9 pt reports improvement with scheduled medication  -4/14  pain overall controlled, continue current medications, using oxycodone as needed about once a day  -4/16 pain controlled, continue current regimen  -Team conference today please see physician documentation under team conference tab, met with team  to discuss problems,progress, and goals. Formulized individual treatment plan based on medical history, underlying problem and comorbidities.   4. Mood/Behavior/Sleep: Provide emotional support             -antipsychotic agents: N/A 5. Neuropsych/cognition: This patient is capable of making decisions on his own behalf. 6. Skin/Wound Care: Routine skin checks 7. Fluids/Electrolytes/Nutrition: Routine in and outs with follow-up chemistries 8.  History of recurrent non-small cell lung cancer.  Previously received chemotherapy and immunotherapy in 2020 and 2022.  Followed outpatient by pulmonary team as well as oncology services Dr. Si Gaul 9.  History of radiation-induced IPF.  Continue bronchodilators and steroid as directed 10.  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  Followed outpatient by urology services Dr. Cardell Peach.  Continue Flomax             -Reports dark urine at baseline  -4/7-8 continent, reports at baseline  -4/9 Discussed with urology, will check U/A culture. Dark/blood tinged urine may be related to his overdue for replacement ureteral stent. As pt mentioned likely limited on options for procedural options due to pulmonary status. Could consider  Pyridium  -4/10 UA with bacteria, WBC 10-20, but no nitrites, he does have burning on urination but it sounds like this is more subacute/chronic.  Will continue to monitor cultures for now  4/11 insignificant growth on urine culture, consider Pyridium if discomfort worsens 11.  Tingling bilateral feet/neuropathy-chronic             -Potentially paraneoplastic, continue to monitor  12.  Leukocytosis  -4/3 WBC within normal limits today  4/7 resolved  4/15 WBC within normal limits at 6.9 13. Constipation   -4/3 LBM 4/1, patient to try suppository today, continue MiraLAX and Senokot.  Add sorbitol as needed  -4/4 LBM today, large continue to monitor  -LBM 4/6, consider additional medication if NO bm today, denies feeling constipated  -4/9 order storbitol   4/10 LBM yesterday, Continue current regimen, discussed sorbitol PRN   4/16 LMB yesterday, improved 14. Lower ext edema:  -4/7 hyperkalemic (5.6) on labs today -ongoing  edema  Filed Weights   05/17/23 0439 05/18/23 0500 05/19/23 0445  Weight: 94.3 kg 94.6 kg 90.5 kg   -resume lasix 20mg  daily today, check labs tomorrow  -lokelma x 1 today  4/8 Cr. 1.15/BUN 43, continue current regimen. K+ down to 4.1  -Discussed leg elevated, compression stockings  -Improved with IV lasix  -4/15 Much improved, appears he has had bumex ordered today by critical care. Cr a little higher at 1.29  -4/16 good diuresis with bumex  15. Acute on chronic cor pulmonale - Pulmonology consulted appreciate assistance.  continue Unna boots, IV diuretics, BiPAP at bedtime.  No review indicates they are try to get him in Oxymizer.  If organ dysfunction develops they may transfer for IR inotrope treatment. -4/14 tolerating CPAP better -4/15 appears benefit with CPAP, CXR has been ordered by critical care team- stable -4/16 discussed with pulmonology, diuresing well, may benefit from oral steroids  16. Hyperkalemia/Hypokalemia  -BNP 200.8  -K+ 4.2, continue  supplementation and daily labs  17. Tachycardia: magnesium supplement added, increase to 500mg     05/19/2023   10:32 AM 05/19/2023    8:51 AM 05/19/2023    6:00  AM  Vitals with BMI  Systolic 106  108  Diastolic 74  63  Pulse 110 111 98    18. Screening for vitamin D deficiency: will check level on Monday  -Vit D -34.85  LOS: 14 days A FACE TO FACE EVALUATION WAS PERFORMED  Lylia Sand 05/19/2023, 12:51 PM

## 2023-05-19 NOTE — Progress Notes (Signed)
 Physical Therapy Session Note  Patient Details  Name: Philip [name] MRN: [id number] Date of Birth: Aug 05, 1955  Today's Date: 05/19/2023 PT Individual Time: 0900-0940 PT Individual Time Calculation (min): 40 min   Short Term Goals: Week 2:  PT Short Term Goal 1 (Week 2): STG=LTG 2/2 ELOS  Skilled Therapeutic Interventions/Progress Updates:      Pt supine in bed upon arrival. Pt agreeable to therapy. Pt receiving nebulizer treatment upon arrival with respiratory therapy.   Upon removal of nebulizer treatment, pt SpO2 on 88 on 12 L nasal cannula.   Supine to sit with mod I. O2 decreased to 78, recovered to 86 within 2 min.    Attemtped to reduce oxygen to 10 L, however pt desatting to 76 while sitting EOB only at rest.   Pt sitting EOB between 80 and 90 on 12 L nasal cannula while resting with use of pep flutter valve, dropped to as low as 76 with activity EOB and stand pivot transfer bed to recliner with no AD and supervision.  Pt seated EOB performed the following exercises to open chest/airway    1x10 seated scapular retraction  1x10 posterior shoulder rolls  Pt seated in recliner with all needs within reach.           Therapy Documentation Precautions:  Precautions Precautions: Fall, Other (comment) Recall of Precautions/Restrictions: Intact Precaution/Restrictions Comments: Monitor O2. Goal: 88% or greater on 6L Restrictions Weight Bearing Restrictions Per Provider Order: No  Therapy/Group: Individual Therapy  [facility] Jenney Modest, [place], DPT  05/19/2023, 8:58 AM

## 2023-05-19 NOTE — Progress Notes (Signed)
 Inpatient Rehabilitation Discharge Medication Review by a Pharmacist  A complete drug regimen review was completed for this patient to identify any potential clinically significant medication issues.  High Risk Drug Classes Is patient taking? Indication by Medication  Antipsychotic No   Anticoagulant Yes Xarelto - PE  Antibiotic No   Opioid Yes Norco- q12h and prn moderate pain Oxycodone - prn severe pain, SOB  Antiplatelet No   Hypoglycemics/insulin No   Vasoactive Medication Yes Tamsulosin - BPH  Chemotherapy No   Other Yes Brovana, Pulmicort, Duo-nebs, prednisone - ARF, COPD Mucinex - expectorant Magnesium/Potassium - supplement  PRNs: Duoneb prn wheezing, SOB Biotene prn dry mouth Cepacol prn sore throat Dulcolax/Miralax/Senokot S/Sorbitol prn constipation Ondansetron prn N/V Ibuprofen prn mild pain Maalox/Mylanta prn indigestion, heartburn Robitussin DM prn cough     Type of Medication Issue Identified Description of Issue Recommendation(s)  Drug Interaction(s) (clinically significant)     Duplicate Therapy     Allergy     No Medication Administration End Date     Incorrect Dose     Additional Drug Therapy Needed     Significant med changes from prior encounter (inform family/care partners about these prior to discharge).    Other       Clinically significant medication issues were identified that warrant physician communication and completion of prescribed/recommended actions by midnight of the next day:  No  Name of provider notified for urgent issues identified:   Provider Method of Notification:     Pharmacist comments: None  Time spent performing this drug regimen review (minutes):  20 minutes  Armanda Bern, PharmD, BCPS 05/19/2023 4:41 PM

## 2023-05-19 NOTE — IPAL (Signed)
  Interdisciplinary Goals of Care Family Meeting   Date carried out: 05/19/2023  Location of the meeting: Bedside  Member's involved: Physician  Durable Power of Attorney or acting medical decision maker: Patient    Discussion: We discussed goals of care for Philip [name] .  We discussed his chronic medical conditions and unchanged respiratory status. He reports he has a living will. He confirms that he is a DNR. After discussion he does state he would want a trial of intubation but would not want to be long term on a ventilator. He will discuss with his family his goals.   Code status:   Code Status: Do not attempt resuscitation (DNR) PRE-ARREST INTERVENTIONS DESIRED   Disposition: Continue current acute care  Time spent for the meeting: 15 min    Shanah Guimaraes Genetta Kenning, MD  05/19/2023, 2:56 PM

## 2023-05-19 NOTE — Progress Notes (Signed)
 Pt on 15L HFNC with increased WOB, SP02 in the low 80's.  Pt placed on HHFNC 40L/60% with improved WOB, Sp02 92-94%.  Pt tolerating well at this time.

## 2023-05-19 NOTE — Patient Care Conference (Signed)
 Inpatient RehabilitationTeam Conference and Plan of Care Update Date: 05/19/2023   Time: 1201 pm    Patient Name: Philip [name]      Medical Record Number: [id number]  Date of Birth: [birth year] Sex: Male         Room/Bed: 4M13C/4M13C-01 Payor Info: Payor: [employer] / Plan: [employer] / Product Type: *No Product type* /    Admit Date/Time:  05/05/2023  1:46 PM  Primary Diagnosis:  Acute respiratory failure [facility])  Hospital Problems: Principal Problem:   Acute respiratory failure (HCC) Active Problems:   Hypokalemia   Chronic low back pain   Leukocytosis   Constipation   Generalized edema    Expected Discharge Date: Expected Discharge Date: 05/19/23  Team Members Present: Physician leading conference: Dr. Lylia Sand Social Worker Present: Adrianna Albee, LCSW Nurse Present: Jerene Monks, RN PT Present: Jenney Modest, PT OT Present: Henrene Locust, OT PPS Coordinator present : Jestine Moron, SLP     Current Status/Progress Goal Weekly Team Focus  Bowel/Bladder   Patient is continent of bowel and bladder   Pt will maintain continence   Will assess q shift na d PRN    Swallow/Nutrition/ Hydration               ADL's   SBA overall for ADL and functional transfers using RW. Continues to be limited medically from SOB and vital change during activity. From a therapy standpoint, he's ready for discharge.   Mod I. Has shower chair. Rec BSC. HHOT   energy conservation, activity tolerance while completing self care tasks.    Mobility   mod I/supervision for bed mobility, sit to stand, stand pivot transfer, gait x25-40 feet with no AD and close WC follow 2/2 respiratory. From a therapy standpoint he is ready for discharge.   mod I/sup  barriers to discharge: ramp installation, medical status-pt does not feel safe discharging home at this time. Pt desatting to as low as 70 with activity on 10 L and utilizing non breatheather as needed.    Communication                 Safety/Cognition/ Behavioral Observations               Pain   Patient c/o pain in right Kidney area   Pain will be managed with PRN pain meds   Will assess q shift and PRN    Skin   Skin is intact   Will maintain skin interity  Will assess q shift and PRN      Discharge Planning:  HOme with wife who is able to assist, medical issues. If need BIpap unsre if can get prior to DC home. Pt does not feel ready to go home due to desats to 70's on 10 liters of O2    Team Discussion: Patient was admitted post acute respiratory distress/acute on chronic hypoxia/Pseudomonas pneumonia. Patient on chronic oxygen use. Patient limited by oxygen desaturation  to as low as 74% on 10L-12 L on ambulation and while laying on  bed and  talking, anxiety,  pain, activity intolerance , bilateral neuropathy and lower extremity edema.    Patient on target to meet rehab goals: yes, Patient met all his therapy goals.  *See Care Plan and progress notes for long and short-term goals.   Revisions to Treatment Plan:  Acute/ hospitalist care referral  Teaching Needs Safety, medications, home oxygen education, transfers, toileting., etc.    Current Barriers to Discharge: Decreased caregiver support  Possible Resolutions to Barriers: Family education  Home health follow -up DME: TTB     Medical Summary Current Status: Acute on chronic cor pulmonale, respiratory failure, constipation  Barriers to Discharge: Medical stability;Oxygen Requirement;Self-care education;Volume Overload;Cardiac Complications  Barriers to Discharge Comments: Acute on chronic cor pulmonale, respiratory failure, constipation Possible Resolutions to [employer] Focus: BIPAP, Discuss options with pulmonary, hypokalemia   Continued Need for Acute Rehabilitation Level of Care: The patient requires daily medical management by a physician with specialized training in physical medicine and rehabilitation for the  following reasons: Direction of a multidisciplinary physical rehabilitation program to maximize functional independence : Yes Medical management of patient stability for increased activity during participation in an intensive rehabilitation regime.: Yes Analysis of laboratory values and/or radiology reports with any subsequent need for medication adjustment and/or medical intervention. : Yes   I attest that I was present, lead the team conference, and concur with the assessment and plan of the team.   Jerene Monks 05/19/2023, 1201 pm

## 2023-05-19 NOTE — Plan of Care (Signed)

## 2023-05-19 NOTE — Plan of Care (Signed)
  Problem: RH BOWEL ELIMINATION Goal: RH STG MANAGE BOWEL WITH ASSISTANCE Description: STG Manage Bowel Mod I with Assistance with medications Outcome: Progressing   Problem: RH BLADDER ELIMINATION Goal: RH STG MANAGE BLADDER WITH ASSISTANCE Description: STG Manage Bladder With Mod I Assistance Outcome: Progressing   Problem: RH SKIN INTEGRITY Goal: RH STG SKIN FREE OF INFECTION/BREAKDOWN Outcome: Progressing   Problem: RH SAFETY Goal: RH STG ADHERE TO SAFETY PRECAUTIONS W/ASSISTANCE/DEVICE Description: STG Adhere to Safety Precautions With Mod I Assistance/Device. Outcome: Progressing   Problem: RH PAIN MANAGEMENT Goal: RH STG PAIN MANAGED AT OR BELOW PT'S PAIN GOAL Description: <4 w// prns Outcome: Progressing   Problem: RH KNOWLEDGE DEFICIT GENERAL Goal: RH STG INCREASE KNOWLEDGE OF SELF CARE AFTER HOSPITALIZATION Description: Manage increase knowledge of self care after hospitalization using educational materials provided Outcome: Progressing

## 2023-05-19 NOTE — Progress Notes (Signed)
 Patient ID: Philip [name], male   DOB: 17-Feb-1955, 68 y.o.   MRN: [id number]  According to MD pt is transferring to acute for his medical issues. He has met his rehab goals.

## 2023-05-19 NOTE — Progress Notes (Signed)
 MEWS Progress Note  Patient Details Name: Philip [name] MRN: [id number] DOB: Mar 21, 1955 Today's Date: 05/19/2023   MEWS Flowsheet Documentation:  Assess: MEWS Score Temp: 98.7 F (37.1 C) BP: 114/78 MAP (mmHg): 90 Pulse Rate: (!) 119 ECG Heart Rate: (!) 119 Resp: 20 Level of Consciousness: Alert SpO2: 91 % O2 Device: High Flow Nasal Cannula O2 Flow Rate (L/min): 12 L/min Assess: MEWS Score MEWS Temp: 0 MEWS Systolic: 0 MEWS Pulse: 2 MEWS RR: 0 MEWS LOC: 0 MEWS Score: 2 MEWS Score Color: Yellow Assess: SIRS CRITERIA SIRS Temperature : 0 SIRS Respirations : 0 SIRS Pulse: 1 SIRS WBC: 0 SIRS Score Sum : 1 SIRS Temperature : 0 SIRS Pulse: 1 SIRS Respirations : 0 SIRS WBC: 0 SIRS Score Sum : 1 Assess: if the MEWS score is Yellow or Red Were vital signs accurate and taken at a resting state?: Yes Does the patient meet 2 or more of the SIRS criteria?: No MEWS guidelines implemented : Yes, yellow Treat MEWS Interventions: Considered administering scheduled or prn medications/treatments as ordered Take Vital Signs Increase Vital Sign Frequency : Yellow: Q2hr x1, continue Q4hrs until patient remains green for 12hrs Escalate MEWS: Escalate: Yellow: Discuss with charge nurse and consider notifying provider and/or RRT Notify: Charge Nurse/RN Name of Charge Nurse/RN Notified: Purcell Bruce RN      Purcell Bruce B 05/19/2023, 4:41 PM

## 2023-05-19 NOTE — H&P (Signed)
 History and Physical    Patient: Philip [name] OZH:[id number] DOB: 04-19-1955 DOA: 05/19/2023 DOS: the patient was seen and examined on 05/19/2023 PCP: Philip Has, [name]  Patient coming from:  Rehab Chief complaint: No chief complaint on file.  HPI:  Philip [name] is a 68 y.o. 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 Jettie [name] coming rehab as direct admit for respiratory failure and progressively increased oxygen requirement.  Patient [name] been followed by [facility] for pulmonology.  Code status discussed with patient and he wishes to be FULL CODE.  Patient states that between [facility] and here he [name] been here for total of a month.  ED Course: Direct admit from rehab Pt in ed at bedside  is alert awake oriented afebrile O2 sat 91% on high flow nasal cannula at 12 L/min. Vital signs in the ED were notable for the following:  Vitals:   05/19/23 1639 05/19/23 1915  BP: 114/78   Pulse: (!) 119   Temp: 98.7 F (37.1 C)   Resp: 20   Height:  5\' 7"  (1.702 m)  SpO2: 91%   TempSrc: Oral Oral  Lab work today: -BMP shows AKI of 1.26. -CBC ordered and pending , anemia from yesterday at 10.7 - Chest x-ray showed stable right-sided pleural effusion. - EKG ordered and pending.  >>While in the ED patient received the following: Medications  hydrALAZINE (APRESOLINE) injection 5 mg ([name] no administration in time range)  0.9 %  sodium chloride infusion ([name] no administration in time range)  alum & mag hydroxide-simeth (MAALOX/MYLANTA) 200-200-20  MG/5ML suspension 15 mL ([name] no administration in time range)  antiseptic oral rinse (BIOTENE) solution 15 mL ([name] no administration in time range)  arformoterol (BROVANA) nebulizer solution 15 mcg ([name] no administration in time range)  budesonide (PULMICORT) nebulizer solution 0.5 mg ([name] no administration in time range)  guaiFENesin-dextromethorphan (ROBITUSSIN DM) 100-10 MG/5ML syrup 5 mL ([name] no administration in time range)  rivaroxaban (XARELTO) tablet 20 mg (20 mg Oral Given 05/19/23 1831)  tamsulosin (FLOMAX) capsule 0.4 mg ([name] no administration in time range)   Review of Systems  Unable to perform ROS: Acuity of condition  Respiratory:  Positive for shortness of breath.    Past Medical History:  Diagnosis Date   Chronic kidney disease    COPD (chronic obstructive pulmonary disease) (HCC)    Dyspnea    GERD (gastroesophageal reflux disease)    Headache    migraines   Hyperlipidemia    lung ca dx'd 12/2018   On home oxygen therapy    3 to 10 L   Pneumonia    Pulmonary embolus (HCC)    Tobacco abuse    Past Surgical History:  Procedure Laterality Date   COLONOSCOPY     CYSTOSCOPY W/ URETERAL STENT PLACEMENT Right 10/24/2021   Procedure: CYSTOSCOPY WITH RETROGRADE PYELOGRAM/URETERAL STENT PLACEMENT;  Surgeon: Philip [name];  Location: [facility];  Service: Urology;  Laterality: Right;   CYSTOSCOPY WITH RETROGRADE PYELOGRAM, URETEROSCOPY AND STENT PLACEMENT Right 10/12/2022   Procedure: CYSTOSCOPY WITH RIGHT  RETROGRADE PYELOGRAM, RIGHT STENT EXCHANGE;  Surgeon: Philip [name];  Location: [facility];  Service: Urology;  Laterality: Right;   HAND SURGERY     VASECTOMY     VIDEO BRONCHOSCOPY WITH ENDOBRONCHIAL ULTRASOUND N/A 12/22/2018   Procedure: VIDEO BRONCHOSCOPY WITH ENDOBRONCHIAL ULTRASOUND;  Surgeon: Prudy Brownie, DO;  Location: [facility];  Service: Thoracic;  Laterality: N/A;   VIDEO BRONCHOSCOPY WITH ENDOBRONCHIAL ULTRASOUND N/A 01/09/2019   Procedure: VIDEO  BRONCHOSCOPY WITH ENDOBRONCHIAL ULTRASOUND WITH FLUORO;  Surgeon: Prudy Brownie, DO;  Location: [facility];  Service: Thoracic;  Laterality: N/A;   VIDEO BRONCHOSCOPY WITH RADIAL ENDOBRONCHIAL ULTRASOUND N/A 12/22/2018   Procedure: RADIAL ENDOBRONCHIAL ULTRASOUND;  Surgeon: Prudy Brownie, DO;  Location: [facility] OR;  Service: Thoracic;  Laterality: N/A;    reports that he quit smoking about 17 months ago. His smoking use included cigarettes. He started smoking about 52 years ago. He [name] a 102.8 pack-year smoking history. He [name] quit using smokeless tobacco.  His smokeless tobacco use included chew. He reports current alcohol use. He reports that he does not currently use drugs. Allergies  Allergen Reactions   Carboplatin Anaphylaxis, Shortness Of Breath, Cough and Hypertension    On dose 8. Became short of breath. Symptom management was called. Received benadryl, solumedrol, Pepcid and fluids. Medication discontinued.    Klonopin [Clonazepam] Other (See Comments)    nervous   Norco [Hydrocodone-Acetaminophen] Other (See Comments)    Made pt feel jittery    Family History  Problem Relation Age of Onset   Cancer Father    Stomach cancer Father    Prior to Admission medications   Medication Sig Start Date End Date Taking? Authorizing Provider  albuterol (VENTOLIN HFA) 108 (90 Base) MCG/ACT inhaler Inhale 2 puffs into the lungs every 4 (four) hours as needed for wheezing or shortness of breath. 05/19/23   Philip [name], Philip Hockey, [name]  bisacodyl (DULCOLAX) 10 MG suppository Place 1 suppository (10 mg total) rectally daily as needed for moderate constipation. 05/03/23   Amin, Ankit C, [name]  budeson-glycopyrrolate-formoterol (BREZTRI AEROSPHERE) 160-9-4.8 MCG/ACT AERO inhaler Inhale 2 puffs into the lungs in the morning and at bedtime. 05/19/23   Philip [name], Philip Hockey, [name]  budesonide (PULMICORT) 0.5 MG/2ML nebulizer solution Take 2 mLs (0.5 mg total) by nebulization 2 (two) times daily as needed. 05/19/23    Philip [name], Philip Hockey, [name]  guaiFENesin (MUCINEX) 600 MG 12 hr tablet Take 1 tablet (600 mg total) by mouth 2 (two) times daily. 05/19/23   Philip [name], Philip J, [name]  HYDROcodone-acetaminophen (NORCO/VICODIN) 5-325 MG tablet Take 1 tablet by mouth every 12 (twelve) hours as needed for moderate pain (pain score 4-6). 05/19/23   Philip [name], Philip Hockey, [name]  ibuprofen (ADVIL) 400 MG tablet Take 1 tablet (400 mg total) by mouth every 6 (six) hours as needed for mild pain (pain score 1-3) (or Fever >/= 101). 05/12/23   Philip [name], Philip Hockey, [name]  ipratropium-albuterol (DUONEB) 0.5-2.5 (3) MG/3ML SOLN Take 3 mLs by nebulization 2 (two) times daily. 05/19/23   Philip [name], Philip Hockey, [name]  magnesium oxide (MAG-OX) 400 MG tablet Take 1 tablet (400 mg total) by mouth daily. 05/19/23   Philip [name], Philip Hockey, [name]  Multiple Vitamins-Minerals (MULTI ADULT GUMMIES) CHEW Chew 1 tablet by mouth daily. 09/02/21   Provider, Historical, [name]  naphazoline-pheniramine (VISINE) 0.025-0.3 % ophthalmic solution Place 1 drop into both eyes daily as needed for eye irritation.    Provider, Historical, [name]  polyethylene glycol (MIRALAX / GLYCOLAX) 17 g packet Take 17 g by mouth  daily. 05/12/23   Philip [name], Mcarthur Rossetti, [name]  predniSONE (DELTASONE) 20 MG tablet Take 3 tablets (60 mg total) by mouth daily with breakfast. 05/20/23   Philip [name], Mcarthur Rossetti, [name]  rivaroxaban (XARELTO) 20 MG TABS tablet Take 1 tablet (20 mg total) by mouth daily with supper. 05/19/23   Philip [name], Mcarthur Rossetti, [name]  senna-docusate (SENOKOT-S) 8.6-50 MG tablet Take 1 tablet by mouth at bedtime as needed for moderate constipation. 05/03/23   Amin, Ankit C, [name]  tamsulosin (FLOMAX) 0.4 MG CAPS capsule Take 1 capsule (0.4 mg total) by mouth at bedtime. 05/19/23   Charlton Amor, [name]                                                                                 Vitals:   05/19/23 1639 05/19/23 1915  BP: 114/78   Pulse: (!) 119   Resp: 20   Temp: 98.7 F (37.1 C)   TempSrc: Oral Oral   SpO2: 91%   Height:  5\' 7"  (1.702 m)   Physical Exam Vitals and nursing note reviewed.  Constitutional:      General: He is not in acute distress. HENT:     Head: Normocephalic and atraumatic.     Right Ear: Hearing normal.     Left Ear: Hearing normal.     Nose: Nose normal. No nasal deformity.     Mouth/Throat:     Lips: Pink.     Tongue: No lesions.     Pharynx: Oropharynx is clear.  Eyes:     General: Lids are normal.     Extraocular Movements: Extraocular movements intact.  Cardiovascular:     Rate and Rhythm: Normal rate and regular rhythm.     Heart sounds: Normal heart sounds.  Pulmonary:     Effort: Pulmonary effort is normal.     Breath sounds: Rhonchi and rales present.  Abdominal:     General: Bowel sounds are normal. There is no distension.     Palpations: Abdomen is soft. There is no mass.     Tenderness: There is no abdominal tenderness.  Musculoskeletal:     Right lower leg: No edema.     Left lower leg: No edema.  Skin:    General: Skin is warm.  Neurological:     General: No focal deficit present.     Mental Status: He is alert and oriented to person, place, and time.     Cranial Nerves: Cranial nerves 2-12 are intact.  Psychiatric:        Attention and Perception: Attention normal.        Mood and Affect: Mood normal.        Speech: Speech normal.        Behavior: Behavior normal. Behavior is cooperative.     Labs on Admission: I have personally reviewed following labs and imaging studies CBC: Recent Labs  Lab 05/18/23 0519  WBC 6.9  HGB 10.7*  HCT 33.6*  MCV 92.1  PLT 255   Basic Metabolic Panel: Recent Labs  Lab 05/14/23 0622 05/15/23 1307 05/17/23 0537 05/18/23 0519 05/19/23 1244  NA 137 136 138 137 137  K 3.3* 4.0 4.6 4.2 4.5  CL 93* 92* 93* 93* 94*  CO2 35* 33* 34* 34* 33*  GLUCOSE 110* 123* 125* 115* 125*  BUN 16 18 22 18 20   CREATININE 1.11 1.20 1.20 1.29* 1.26*  CALCIUM 8.7* 9.1 9.1 9.1 9.3  MG 1.8 2.0 2.0 2.1  --     GFR: Estimated Creatinine Clearance: 61.1 mL/min (A) (by C-G formula based on SCr of 1.26 mg/dL (H)). Liver Function Tests: No results for input(s): "AST", "ALT", "ALKPHOS", "BILITOT", "PROT", "ALBUMIN" in the last 168 hours. No results for input(s): "LIPASE", "AMYLASE" in the last 168 hours. No results for input(s): "AMMONIA" in the last 168 hours. Coagulation Profile: No results for input(s): "INR", "PROTIME" in the last 168 hours. Cardiac Enzymes: No results for input(s): "CKTOTAL", "CKMB", "CKMBINDEX", "TROPONINI" in the last 168 hours. BNP (last 3 results) No results for input(s): "PROBNP" in the last 8760 hours. HbA1C: No results for input(s): "HGBA1C" in the last 72 hours. CBG: No results for input(s): "GLUCAP" in the last 168 hours. Lipid Profile: No results for input(s): "CHOL", "HDL", "LDLCALC", "TRIG", "CHOLHDL", "LDLDIRECT" in the last 72 hours. Thyroid Function Tests: No results for input(s): "TSH", "T4TOTAL", "FREET4", "T3FREE", "THYROIDAB" in the last 72 hours. Anemia Panel: No results for input(s): "VITAMINB12", "FOLATE", "FERRITIN", "TIBC", "IRON", "RETICCTPCT" in the last 72 hours. Urine analysis:    Component Value Date/Time   COLORURINE AMBER (A) 05/12/2023 1200   APPEARANCEUR CLOUDY (A) 05/12/2023 1200   LABSPEC 1.010 05/12/2023 1200   PHURINE 6.0 05/12/2023 1200   GLUCOSEU NEGATIVE 05/12/2023 1200   HGBUR MODERATE (A) 05/12/2023 1200   BILIRUBINUR NEGATIVE 05/12/2023 1200   KETONESUR NEGATIVE 05/12/2023 1200   PROTEINUR 100 (A) 05/12/2023 1200   NITRITE NEGATIVE 05/12/2023 1200   LEUKOCYTESUR NEGATIVE 05/12/2023 1200   Radiological Exams on Admission: DG CHEST PORT 1 VIEW Result Date: 05/18/2023 CLINICAL DATA:  Hypoxia EXAM: PORTABLE CHEST 1 VIEW COMPARISON:  05/12/2023 FINDINGS: Postoperative changes and volume loss on the right. Elevation of the right hemidiaphragm. Small right pleural effusion. Chronic perihilar and right lower lobe opacities,  unchanged. Heart and mediastinal contours are within normal limits. No acute bony abnormality. IMPRESSION: Stable small right pleural effusion with chronic changes throughout the lungs. No significant change. Electronically Signed   By: Charlett Nose M.D.   On: 05/18/2023 17:10   Data Reviewed: Relevant notes from primary care and specialist visits, past discharge summaries as available in EHR, including Care Everywhere. Prior diagnostic testing as pertinent to current admission diagnoses, Updated medications and problem lists for reconciliation ED course, including vitals, labs, imaging, treatment and response to treatment,Triage notes, nursing and pharmacy notes and ED provider's notes Notable results as noted in HPI.Discussed case with EDMD/ ED APP/ or Specialty [name] on call and as needed.  Assessment & Plan  >> Acute on chronic respiratory failure with hypoxia: Patient unfortunately [name] diagnosis of right ventricular failure and cor pulmonale.  Patient also [name] a history of emphysema and radiation fibrosis. According to pulmonology note continue CPAP nightly continue the Bumex continue with the Unna boots.  Monitor electrolytes and kidney function. Supplemental oxygen as deemed appropriate to keep goal O2 sats above 90%.  >> History of pulmonary embolism will continue patient on Xarelto.  >> COPD: As needed albuterol and DuoNebs as needed.   >> Adenocarcinoma of the right lung stage III: Per patient his lung cancer is stable [name] not grown.  Patient is followed by pulmonary and oncology services.  >> CKD stage III A: Lab Results  Component Value Date   CREATININE 1.26 (H) 05/19/2023   CREATININE 1.29 (H) 05/18/2023   CREATININE 1.20 05/17/2023  Monitor and follow avoid contrast. Patient also [name] a history of BPH and chronic right ureteral stenosis that is followed by urology and will continue patient on his Flomax.   DVT prophylaxis:  Xarelto Consults:  Pulmonology Advance Care  Planning:    Code Status: Full Code   Family Communication:  None Disposition Plan:  To be determined Severity of Illness: The appropriate patient status for this patient is INPATIENT. Inpatient status is judged to be reasonable and necessary in order to provide the required intensity of service to ensure the patient's safety. The patient's presenting symptoms, physical exam findings, and initial radiographic and laboratory data in the context of their chronic comorbidities is felt to place them at high risk for further clinical deterioration. Furthermore, it is not anticipated that the patient will be medically stable for discharge from the hospital within 2 midnights of admission.   * I certify that at the point of admission it is my clinical judgment that the patient will require inpatient hospital care spanning beyond 2 midnights from the point of admission due to high intensity of service, high risk for further deterioration and high frequency of surveillance required.*  Unresulted Labs (From admission, onward)    None       Orders Placed This Encounter  Procedures   Full code   Admit to Inpatient (patient's expected length of stay will be greater than 2 midnights or inpatient only procedure)    Author: Lavanda Porter, [name] 12 pm -8 pm. 05/19/2023 7:43 PM >>Please note for any concern,or critical results after hours past 8pm please contact the [facility] hospitalist [facility] floor coverage provider from 7 PM- 7 AM. For on call review [email or web address], username [email or web address] and PW: your phone number<<

## 2023-05-19 NOTE — Plan of Care (Signed)
 Patient able to complete plan of care but is transferring to acute for medical problems

## 2023-05-19 NOTE — Progress Notes (Signed)
 Occupational Therapy Discharge Note  This patient was unable to complete the inpatient rehab program due to respiratory ; therefore did not meet their long term goals. Pt left the program at a supervision  assist level for their  functional ADLs. This patient is being discharged from OT services at this time.  BIMS at time of d/c  Pt unable to complete due to medical status  See CareTool for functional status details.  If the patient is able to return to inpatient rehabilitation within 3 midnights, this may be considered an interrupted stay and therapy services will resume as ordered. Modification and reinstatement of their goals will be made upon completion of therapy service reevaluations.

## 2023-05-19 NOTE — Progress Notes (Signed)
   NAME:  Philip [name], MRN:  [id number], DOB:  23-Sep-1955, LOS: 14 ADMISSION DATE:  05/05/2023, CONSULTATION DATE:  05/13/23 REFERRING MD:  Dania Dupre, CHIEF COMPLAINT:  DOE, hypoxia    History of Present Illness:  68 yo M PMH recurrent adenocarcinoma of the lung - stg III, radiation induced IPF, chronic hypoxic respiratory failure, PE on xarelto, COPD, pulm MAI, bronchiectasis who was admitted to [facility] 05/05/23 for physical deconditioning following hospitalization 3/16-4/2 for AoC hypoxia, AECOPD, Pseudomonas PNA.   Has had incr O2 requirements in rehab, desats with exertion, is slow to recoup.  There are concerns regarding home O2 options.  PCCM is consulted in this setting  Admits to 20 lb (!!) weight gain in past week and increased leg swelling  Pertinent  Medical History  Chronic hypoxic resp failure IPF Lung cancer Bronchiectasis COPD GERD MAI  PE  Chronic AC (xarelto)   Significant Hospital Events: Including procedures, antibiotic start and stop dates in addition to other pertinent events     Interim History / Subjective:  Good UOP on Bumex Remains unchanged with O2 requirement on 10L Objective   Blood pressure 106/74, pulse (!) 110, temperature 98.7 F (37.1 C), temperature source Oral, resp. rate 17, height 5\' 7"  (1.702 m), weight 90.5 kg, SpO2 90%.        Intake/Output Summary (Last 24 hours) at 05/19/2023 1507 Last data filed at 05/19/2023 1500 Gross per 24 hour  Intake 120 ml  Output 2380 ml  Net -2260 ml   Filed Weights   05/17/23 0439 05/18/23 0500 05/19/23 0445  Weight: 94.3 kg 94.6 kg 90.5 kg   Physical Exam: General: Well-appearing, no acute distress HENT: [place], AT Eyes: EOMI, no scleral icterus Respiratory: Bibasilar crackles Cardiovascular: RRR, -M/R/G, no JVD Extremities:-Edema,-tenderness Neuro: AAO x4, CNII-XII grossly intact Psych: Normal mood, normal affect  BMET pending  Resolved Hospital Problem list   N/A  Assessment & Plan:   Acute on  chronic cor pulmonale- has all the hallmarks of RV failure and his echo shows it also although read as normal.  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  CT also suggestive of tracheobronchomalacia.  Cannot r/o group IV but already on AC.  Unchanged O2 requirement. Remains on 10-11L O2 Good diuresis Plan Continue Bumex 2 mg Replete potassium Continue Unna boots Continue CPAP nightly   Best Practice (right click and "Reselect all SmartList Selections" daily)   Care Time: 36 min  Genetta Kenning, M.D. [facility] Pulmonary/Critical Care Medicine 05/19/2023 3:07 PM   See Amion for personal pager For hours between 7 PM to 7 AM, please call Elink for urgent questions

## 2023-05-19 NOTE — Progress Notes (Signed)
 Physical Therapy Note  Patient Details  Name: Philip [name] MRN: [id number] Date of Birth: [birth year] Today's Date: 05/19/2023    Physical Therapy Discharge Note  This patient was unable to complete the inpatient rehab program due to respiratory; therefore did not meet their long term goals. Pt left the program at a supervision assist level for their functional mobility/ transfers. This patient is being discharged from PT services at this time.  Pt's perception of pain in the last five days was occasionally.   See CareTool for functional status details  If the patient is able to return to inpatient rehabilitation within 3 midnights, this may be considered an interrupted stay and therapy services will resume as ordered. Modification and reinstatement of their goals will be made upon completion of therapy service reevaluations.     [facility] [place], [place], DPT  05/19/2023, 3:16 PM

## 2023-05-19 NOTE — Progress Notes (Signed)
 Inpatient Rehabilitation Care Coordinator Discharge Note   Patient Details  Name: Philip [name] MRN: [id number] Date of Birth: 1955-10-22   Discharge location: transferring to acute due to medical issues high need for O2  Length of Stay: 13 days  Discharge activity level: mod/i-supervision  Home/community participation: active  Patient response WG:NFAOZH Literacy - How often do you need to have someone help you when you read instructions, pamphlets, or other written material from your doctor or pharmacy?: Never  Patient response YQ:MVHQIO Isolation - How often do you feel lonely or isolated from those around you?: Never  Services provided included: MD, RD, PT, OT, RN, CM, Pharmacy, Neuropsych, SW  Financial Services:  Field seismologist Utilized: Training and development officer  Choices offered to/list presented to: pt  Follow-up services arranged:  Home Health Home Health Agency: [facility] PT OT RN         Patient response to transportation need: Is the patient able to respond to transportation needs?: Yes In the past 12 months, has lack of transportation kept you from medical appointments or from getting medications?: No In the past 12 months, has lack of transportation kept you from meetings, work, or from getting things needed for daily living?: No   Patient/Family verbalized understanding of follow-up arrangements:  Yes  Individual responsible for coordination of the follow-up plan: self  Confirmed correct DME delivered: Mardell Shade 05/19/2023    Comments (or additional information):high need for O2  Summary of Stay    Date/Time Discharge Planning CSW  05/19/23 9629 HOme with wife who is able to assist, medical issues. If need BIpap unsre if can get prior to DC home. Pt does not feel ready to go home due to desats to 70's on 10 liters of O2 RGD  05/12/23 1003 Home with wife who is able to provid assist, pt is doing well and needs to work  on activity tolerance and strengthening RGD       Sandralee Tarkington, Maralee Senate

## 2023-05-20 ENCOUNTER — Telehealth: Payer: Self-pay

## 2023-05-20 ENCOUNTER — Other Ambulatory Visit: Payer: Self-pay

## 2023-05-20 ENCOUNTER — Inpatient Hospital Stay (HOSPITAL_COMMUNITY)

## 2023-05-20 DIAGNOSIS — C3491 Malignant neoplasm of unspecified part of right bronchus or lung: Secondary | ICD-10-CM

## 2023-05-20 DIAGNOSIS — J440 Chronic obstructive pulmonary disease with acute lower respiratory infection: Secondary | ICD-10-CM

## 2023-05-20 DIAGNOSIS — J449 Chronic obstructive pulmonary disease, unspecified: Secondary | ICD-10-CM

## 2023-05-20 DIAGNOSIS — J9621 Acute and chronic respiratory failure with hypoxia: Secondary | ICD-10-CM

## 2023-05-20 DIAGNOSIS — K219 Gastro-esophageal reflux disease without esophagitis: Secondary | ICD-10-CM

## 2023-05-20 DIAGNOSIS — J9601 Acute respiratory failure with hypoxia: Secondary | ICD-10-CM | POA: Diagnosis not present

## 2023-05-20 MED ORDER — POLYETHYLENE GLYCOL 3350 17 G PO PACK
17.0000 g | PACK | Freq: Every day | ORAL | Status: DC
  Administered 2023-05-21 – 2023-05-28 (×5): 17 g via ORAL
  Filled 2023-05-20 (×9): qty 1

## 2023-05-20 MED ORDER — METHYLPREDNISOLONE SODIUM SUCC 125 MG IJ SOLR
60.0000 mg | Freq: Two times a day (BID) | INTRAMUSCULAR | Status: DC
  Administered 2023-05-20 – 2023-05-24 (×8): 60 mg via INTRAVENOUS
  Filled 2023-05-20 (×8): qty 2

## 2023-05-20 MED ORDER — IPRATROPIUM-ALBUTEROL 0.5-2.5 (3) MG/3ML IN SOLN
3.0000 mL | RESPIRATORY_TRACT | Status: DC | PRN
  Administered 2023-05-20 – 2023-05-21 (×2): 3 mL via RESPIRATORY_TRACT
  Filled 2023-05-20: qty 3

## 2023-05-20 MED ORDER — CHLORHEXIDINE GLUCONATE CLOTH 2 % EX PADS
6.0000 | MEDICATED_PAD | Freq: Every day | CUTANEOUS | Status: DC
  Administered 2023-05-20 – 2023-05-26 (×7): 6 via TOPICAL

## 2023-05-20 MED ORDER — PREDNISONE 20 MG PO TABS: 60.0000 mg | ORAL_TABLET | Freq: Every day | ORAL | Status: DC

## 2023-05-20 MED ORDER — NAPHAZOLINE-PHENIRAMINE 0.025-0.3 % OP SOLN: 1.0000 [drp] | Freq: Every day | OPHTHALMIC | Status: DC | PRN

## 2023-05-20 MED ORDER — REVEFENACIN 175 MCG/3ML IN SOLN
175.0000 ug | Freq: Every day | RESPIRATORY_TRACT | Status: DC
  Administered 2023-05-21 – 2023-05-28 (×8): 175 ug via RESPIRATORY_TRACT
  Filled 2023-05-20 (×8): qty 3

## 2023-05-20 MED ORDER — HYDROCODONE-ACETAMINOPHEN 5-325 MG PO TABS: 1.0000 | ORAL_TABLET | Freq: Once | ORAL | Status: DC

## 2023-05-20 MED ORDER — SENNOSIDES-DOCUSATE SODIUM 8.6-50 MG PO TABS
1.0000 | ORAL_TABLET | Freq: Every evening | ORAL | Status: DC | PRN
  Administered 2023-05-22: 1 via ORAL
  Filled 2023-05-20 (×2): qty 1

## 2023-05-20 MED ORDER — SODIUM CHLORIDE 0.9% FLUSH
10.0000 mL | Freq: Two times a day (BID) | INTRAVENOUS | Status: DC
  Administered 2023-05-20: 20 mL
  Administered 2023-05-21 (×2): 10 mL
  Administered 2023-05-22: 20 mL
  Administered 2023-05-22: 10 mL
  Administered 2023-05-23: 20 mL
  Administered 2023-05-23 – 2023-05-24 (×2): 10 mL
  Administered 2023-05-24: 20 mL
  Administered 2023-05-25 – 2023-05-27 (×5): 10 mL

## 2023-05-20 MED ORDER — SODIUM CHLORIDE 0.9% FLUSH: 10.0000 mL | INTRAVENOUS | Status: DC | PRN

## 2023-05-20 MED ORDER — HYDROCODONE-ACETAMINOPHEN 5-325 MG PO TABS
1.0000 | ORAL_TABLET | Freq: Two times a day (BID) | ORAL | Status: DC | PRN
  Administered 2023-05-20 – 2023-05-28 (×12): 1 via ORAL
  Filled 2023-05-20 (×12): qty 1

## 2023-05-20 NOTE — Plan of Care (Signed)
  Problem: Education: Goal: Knowledge of General Education information will improve Description: Including pain rating scale, medication(s)/side effects and non-pharmacologic comfort measures Outcome: Progressing   Problem: Clinical Measurements: Goal: Will remain free from infection Outcome: Progressing   Problem: Clinical Measurements: Goal: Respiratory complications will improve Outcome: Progressing   Problem: Clinical Measurements: Goal: Cardiovascular complication will be avoided Outcome: Progressing   Problem: Activity: Goal: Risk for activity intolerance will decrease Outcome: Progressing   

## 2023-05-20 NOTE — Progress Notes (Signed)
 PROGRESS NOTE  Philip [name] OZH:[id number] DOB: 05-13-1955   PCP: Farris Has, MD  Patient is from: CIR  DOA: 05/19/2023 LOS: 1  Chief complaints No chief complaint on file.    Brief Narrative / Interim history: 68 year old M with PMH of stage III lung cancer s/p chemoradiation and immunotherapy in 2020 and 2022, radiation-induced IPF, COPD/chronic hypoxia RF on 4 to 6 L by 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 Jettie Pagan admitted from CIR due to acute on chronic respiratory failure with hypoxemia felt to be due to acute on chronic cor pulmonale.  Patient was hospitalized from 3/16-4/2 for acute on chronic respiratory failure in the setting of Pseudomonas pneumonia and COPD exacerbation and discharged to [facility] for rehabilitation.  While in CIR, he had hide exertional desaturation and increased oxygen requirement, and PCCM consulted on 4/10.  He was started on diuretics but continued to require significant oxygen that prompted readmission to inpatient service.  On admission, he was requiring 12 L by HFNC.  PCCM diuresing with Bumex.    Subjective: Seen and examined earlier this morning.  Patient desaturated to low 80s on 15 L by [facility] last night.  He had increased work of breathing.  Oxygen titrated to 40 L / 60% by HHFNC with improved saturation and work of breathing.  No major complaints today other than feeling hungry.  Seems his breathing is better this morning.  He was n.p.o. Denies chest pain  Objective: Vitals:   05/20/23 0235 05/20/23 0533 05/20/23 0802 05/20/23 1036  BP:  (!) (P) 86/52 (!) 82/45   Pulse:  98 (!) 101   Resp: 20 18 20    Temp:  98.6 F (37 C) 98 F (36.7 C)   TempSrc:  Oral Axillary   SpO2: 93% 98% (!) 89%   Weight:    89.9 kg  Height:    5\' 6"  (1.676 m)    Examination:  GENERAL: No apparent distress.  Nontoxic. HEENT: MMM.  Vision and  hearing grossly intact.  NECK: Supple.  No apparent JVD.  RESP:  No IWOB.  Fair aeration bilaterally. CVS:  RRR. Heart sounds normal.  ABD/GI/GU: BS+. Abd soft, NTND.  MSK/EXT:  Moves extremities. No apparent deformity.  Unna boot bilaterally SKIN: no apparent skin lesion or wound NEURO: Awake, alert and oriented appropriately.  No apparent focal neuro deficit. PSYCH: Calm. Normal affect.   Consultants:  Pulmonology  Procedures: None  Microbiology summarized: None  Assessment and plan: Acute on chronic respiratory failure with hypoxia due to acute on chronic cor pulmonale: TTE reported with normal RVSF on 3/26.  Patient with known history of stage III lung cancer and radiation induced IPF.  He also carries history of chronic COPD.  Reportedly quit smoking about 17 months ago.   CXR on 4/15 with stable small right pleural effusion with chronic change throughout the lung.  Recently hospitalized for acute on chronic respiratory failure in the setting of Pseudomonas pneumonia and COPD exacerbation.  Now with  significantly increased oxygen requirement -Defer diuretics to pulmonology.   -Strict intake and output, daily weight, renal functions and electrolytes -Continue Brovana, Pulmicort -Add Yupelri and DuoNebs. -Encourage incentive spirometry -Wean oxygen as able. 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  Followed by Dr. Arbutus Ped. -Outpatient follow-up.  Recommended stopping on initial consultation on 4/10  History of PE - Continue home Xarelto  Physical deconditioning - PT/OT  BPH - Continue Flomax  Class I obesity Body mass index is 32.01 kg/m.           DVT prophylaxis:   rivaroxaban (XARELTO) tablet 20 mg  Code Status: Full code Family  Communication: None at best. Level of care: Progressive Status is: Inpatient Remains inpatient appropriate because: Acute on chronic respiratory failure/acute on chronic cor pulmonale   Final disposition: To be determined   55 minutes with more than 50% spent in reviewing records, counseling patient/family and coordinating care.   Sch Meds:  Scheduled Meds:  arformoterol  15 mcg Nebulization BID   budesonide (PULMICORT) nebulizer solution  0.5 mg Nebulization BID   HYDROcodone-acetaminophen  1 tablet Oral Once   polyethylene glycol  17 g Oral Daily   [START ON 05/21/2023] predniSONE  60 mg Oral Q breakfast   rivaroxaban  20 mg Oral Q supper   tamsulosin  0.4 mg Oral QHS   Continuous Infusions:  sodium chloride     PRN Meds:.sodium chloride, alum & mag hydroxide-simeth, antiseptic oral rinse, guaiFENesin-dextromethorphan, hydrALAZINE, naphazoline-pheniramine, senna-docusate  Antimicrobials: Anti-infectives (From admission, onward)    None        I have personally reviewed the following labs and images: CBC: Recent Labs  Lab 05/18/23 0519  WBC 6.9  HGB 10.7*  HCT 33.6*  MCV 92.1  PLT 255   BMP &GFR Recent Labs  Lab 05/14/23 0622 05/15/23 1307 05/17/23 0537 05/18/23 0519 05/19/23 1244  NA 137 136 138 137 137  K 3.3* 4.0 4.6 4.2 4.5  CL 93* 92* 93* 93* 94*  CO2 35* 33* 34* 34* 33*  GLUCOSE 110* 123* 125* 115* 125*  BUN 16 18 22 18 20   CREATININE 1.11 1.20 1.20 1.29* 1.26*  CALCIUM 8.7* 9.1 9.1 9.1 9.3  MG 1.8 2.0 2.0 2.1  --    Estimated Creatinine Clearance: 59.7 mL/min (A) (by C-G formula based on SCr of 1.26 mg/dL (H)). Liver & Pancreas: No results for input(s): "AST", "ALT", "ALKPHOS", "BILITOT", "PROT", "ALBUMIN" in the last 168 hours. No results for input(s): "LIPASE", "AMYLASE" in the last 168 hours. No results for input(s): "AMMONIA" in the last 168 hours. Diabetic: No results for input(s): "HGBA1C" in the last 72 hours. No results for  input(s): "GLUCAP" in the last 168 hours. Cardiac Enzymes: No results for input(s): "CKTOTAL", "CKMB", "CKMBINDEX", "TROPONINI" in the last 168 hours. No results for input(s): "PROBNP" in the last 8760 hours. Coagulation Profile: No results for input(s): "INR", "PROTIME" in the last 168 hours. Thyroid Function Tests: No results for input(s): "TSH", "T4TOTAL", "FREET4", "T3FREE", "THYROIDAB" in the last 72 hours. Lipid Profile: No results for input(s): "CHOL", "HDL", "LDLCALC", "TRIG", "CHOLHDL", "LDLDIRECT" in the last 72 hours. Anemia Panel: No results for input(s): "VITAMINB12", "FOLATE", "FERRITIN", "TIBC", "IRON", "RETICCTPCT" in the last 72 hours. Urine analysis:    Component Value Date/Time   COLORURINE AMBER (A) 05/12/2023 1200   APPEARANCEUR CLOUDY (A) 05/12/2023 1200   LABSPEC 1.010 05/12/2023 1200   PHURINE 6.0 05/12/2023 1200   GLUCOSEU NEGATIVE 05/12/2023 1200   HGBUR MODERATE (A) 05/12/2023 1200   BILIRUBINUR NEGATIVE 05/12/2023 1200   KETONESUR NEGATIVE 05/12/2023 1200   PROTEINUR 100 (A) 05/12/2023 1200   NITRITE NEGATIVE 05/12/2023 1200   LEUKOCYTESUR NEGATIVE 05/12/2023 1200   Sepsis  Labs: Invalid input(s): "PROCALCITONIN", "LACTICIDVEN"  Microbiology: Recent Results (from the past 240 hours)  Urine Culture (for pregnant, neutropenic or urologic patients or patients with an indwelling urinary catheter)     Status: Abnormal   Collection Time: 05/12/23 10:29 AM   Specimen: Urine, Clean Catch  Result Value Ref Range Status   Specimen Description URINE, CLEAN CATCH  Final   Special Requests NONE  Final   Culture (A)  Final    <10,000 COLONIES/mL INSIGNIFICANT GROWTH Performed at [facility], [address] [place], [place] [address]    Report Status 05/13/2023 FINAL  Final    Radiology Studies: No results found.    Ariabella Brien T. Nautica Hotz [facility]  If 7PM-7AM, please contact night-coverage [email or web address] 05/20/2023, 11:24 AM

## 2023-05-20 NOTE — Telephone Encounter (Signed)
 Copied from CRM [id number]. Topic: General - Other >> May 19, 2023  4:50 PM Eveleen Hinds B wrote: Reason for CRM:  Patient admitted to [facility]. Daughter would like to talk to Dr Marygrace Snellen to  be a part of care. They moved him to Critical care. Daughter don't think he's seen a pulmologist.  Spoke with patient regarding prior message. Advised patient Dr.Hunsucker is on vacation and will be out of the office .Advised patient is he wants to see one of [place] providers he has to speak with his attending provider at the Hospital to request to see [place] provider's . Patient's voice was understanding.Advised I will send this message over to Dr.Hunsucker as a FYI   Patient's voice was understanding .Nothing else further needed.

## 2023-05-20 NOTE — Progress Notes (Signed)
 NAME:  Philip [name], MRN:  [id number], DOB:  Jul 24, 1955, LOS: 1 ADMISSION DATE:  05/19/2023, CONSULTATION DATE:  05/13/23 REFERRING MD:  Sandria Manly, CHIEF COMPLAINT:  DOE, hypoxia    History of Present Illness:  68 yo M PMH recurrent adenocarcinoma of the lung - stg III, radiation induced IPF, chronic hypoxic respiratory failure, PE on xarelto, COPD, pulm MAI, bronchiectasis who was admitted to [facility] 05/05/23 for physical deconditioning following hospitalization 3/16-4/2 for AoC hypoxia, AECOPD, Pseudomonas PNA.   Has had incr O2 requirements in rehab, desats with exertion, is slow to recoup.  There are concerns regarding home O2 options.  PCCM is consulted in this setting  Admits to 20 lb (!!) weight gain in past week and increased leg swelling  Pertinent  Medical History  Chronic hypoxic resp failure IPF Lung cancer Bronchiectasis COPD GERD MAI  PE  Chronic AC (xarelto)   Significant Hospital Events: Including procedures, antibiotic start and stop dates in addition to other pertinent events   Previous admissions 3/16 > 4/02 admitted for acute on chronic hypoxic respiratory failure in the setting of insensitive Pseudomonas pneumonia.  Discharged to inpatient rehab on prednisone taper Cefepime 3/20 to 3/26 Azithromycin 3/16 to 3/17 Ceftriaxone from 3/16 to 3/19  4/02 >4/16 admitted for acute rehab after discharge from above hospital stay, received aggressive diuretics with pulmonary consult during admission with ~16 liters removed.  Transferred back to inpatient due to increased work of breathing in higher extreme requirements  Current admission 4/16 worsening hypoxia with increased oxygen requirement to 12 L resulted in admission back to inpatient 4/17 Now on 40L HHFN   Interim History / Subjective:  Seen resting in bed comfortable on HHFNC   Objective   Blood pressure (!) 82/45, pulse (!) 101, temperature 98 F (36.7 C), temperature source Axillary, resp. rate 20, height 5\' 6"   (1.676 m), weight 89.9 kg, SpO2 (!) 89%.    FiO2 (%):  [60 %] 60 %   Intake/Output Summary (Last 24 hours) at 05/20/2023 1207 Last data filed at 05/20/2023 1037 Gross per 24 hour  Intake 720 ml  Output 425 ml  Net 295 ml   Filed Weights   05/20/23 1036  Weight: 89.9 kg   Physical Exam: General: Acute on chronically ill appearing middle aged male lying in bed on HHFNC, in NAD HEENT: [place]/AT, MM pink/moist, PERRL,  Neuro: Alert and oriented x3, non-focal  CV: s1s2 regular rate and rhythm, no murmur, rubs, or gallops,  PULM:  Diminished bilaterally, no increased work of breathing, on 40 L HHFNC  GI: soft, bowel sounds active in all 4 quadrants, non-tender, non-distended, tolerating oral diet  Extremities: warm/dry, no edema  Skin: no rashes or lesions  Resolved Hospital Problem list   N/A  Assessment & Plan:  Acute on chronic hypoxic respiratory failure -On 3-4L [place] continuously at baseline, more with exertion  Chronic COPD -Steroid dependent, 10mg  po prednisone at baseline. On Breztri  History of radiation-induced IPF -CTA chest 04/18/23 with post treatment/post radiation change with dense right-greater-than-left perihilar fibrosis and volume loss with signs of likely pulmonary edema as well  Adenocarcinoma of the right lung stage IIIc Pulmonary MAI infection -Most recent positive sputum culture per chart was a pansensitive pseudomonas 04/20/2023 Pleural effusion Chronic cor pulmonale -Consistent with group 3 from radiation fibrosis and emphysema.  CT also suggestive of tracheobronchomalacia.  -During recent IR admission patient was diuresed ~16L from 4/2 to 4/16  History of provoked PE in the setting of malignancy  -  On Xarekto at baseline    Plan/discussion  Patient appears evolemic to slight dry on exam Resume IV solumedrol 60mg  q12 Continue HHFNC for sat goal > 90, wean as able  Place PICC to assess COOX to better asses RV dysfunction  Aspiration precautions  Consider  cardiology consul/heart cath  Scheduled BDs    Signature:  Etheline Geppert D. Harris, NP-C [facility] Personal contact information can be found on Amion  If no contact or response made please call [phone] 05/20/2023, 2:06 PM

## 2023-05-20 NOTE — Progress Notes (Signed)
 Peripherally Inserted Central Catheter Placement  The IV Nurse has discussed with the patient and/or persons authorized to consent for the patient, the purpose of this procedure and the potential benefits and risks involved with this procedure.  The benefits include less needle sticks, lab draws from the catheter, and the patient may be discharged home with the catheter. Risks include, but not limited to, infection, bleeding, blood clot (thrombus formation), and puncture of an artery; nerve damage and irregular heartbeat and possibility to perform a PICC exchange if needed/ordered by physician.  Alternatives to this procedure were also discussed.  Bard Power PICC patient education guide, fact sheet on infection prevention and patient information card has been provided to patient /or left at bedside.    PICC Placement Documentation  PICC Double Lumen 05/20/23 Right Basilic 40 cm 1 cm (Active)  Indication for Insertion or Continuance of Line Vasoactive infusions 05/20/23 1849  Exposed Catheter (cm) 1 cm 05/20/23 1849  Site Assessment Clean, Dry, Intact 05/20/23 1849  Lumen #1 Status Flushed;Saline locked;Blood return noted 05/20/23 1849  Lumen #2 Status Flushed;Saline locked;Blood return noted 05/20/23 1849  Dressing Type Transparent;Securing device 05/20/23 1849  Dressing Status Antimicrobial disc/dressing in place;Clean, Dry, Intact 05/20/23 1849  Line Care Connections checked and tightened 05/20/23 1849  Line Adjustment (NICU/IV Team Only) No 05/20/23 1849  Dressing Intervention New dressing;Adhesive placed at insertion site (IV team only) 05/20/23 1849  Dressing Change Due 05/27/23 05/20/23 1849       Unk Garb 05/20/2023, 6:50 PM

## 2023-05-21 DIAGNOSIS — J440 Chronic obstructive pulmonary disease with acute lower respiratory infection: Secondary | ICD-10-CM | POA: Diagnosis not present

## 2023-05-21 DIAGNOSIS — J90 Pleural effusion, not elsewhere classified: Secondary | ICD-10-CM

## 2023-05-21 DIAGNOSIS — J9621 Acute and chronic respiratory failure with hypoxia: Secondary | ICD-10-CM

## 2023-05-21 DIAGNOSIS — C3491 Malignant neoplasm of unspecified part of right bronchus or lung: Secondary | ICD-10-CM | POA: Diagnosis not present

## 2023-05-21 DIAGNOSIS — J701 Chronic and other pulmonary manifestations due to radiation: Secondary | ICD-10-CM

## 2023-05-21 DIAGNOSIS — J810 Acute pulmonary edema: Secondary | ICD-10-CM

## 2023-05-21 DIAGNOSIS — J9601 Acute respiratory failure with hypoxia: Secondary | ICD-10-CM | POA: Diagnosis not present

## 2023-05-21 LAB — CBC
HCT: 31.2 % — ABNORMAL LOW (ref 39.0–52.0)
Hemoglobin: 9.9 g/dL — ABNORMAL LOW (ref 13.0–17.0)
MCH: 28.4 pg (ref 26.0–34.0)
MCHC: 31.7 g/dL (ref 30.0–36.0)
MCV: 89.7 fL (ref 80.0–100.0)
Platelets: 330 10*3/uL (ref 150–400)
RBC: 3.48 MIL/uL — ABNORMAL LOW (ref 4.22–5.81)
RDW: 14.6 % (ref 11.5–15.5)
WBC: 10.7 10*3/uL — ABNORMAL HIGH (ref 4.0–10.5)
nRBC: 0 % (ref 0.0–0.2)

## 2023-05-21 LAB — COMPREHENSIVE METABOLIC PANEL WITH GFR
ALT: 25 U/L (ref 0–44)
AST: 21 U/L (ref 15–41)
Albumin: 2.7 g/dL — ABNORMAL LOW (ref 3.5–5.0)
Alkaline Phosphatase: 68 U/L (ref 38–126)
Anion gap: 10 (ref 5–15)
BUN: 18 mg/dL (ref 8–23)
CO2: 31 mmol/L (ref 22–32)
Calcium: 8.8 mg/dL — ABNORMAL LOW (ref 8.9–10.3)
Chloride: 94 mmol/L — ABNORMAL LOW (ref 98–111)
Creatinine, Ser: 0.9 mg/dL (ref 0.61–1.24)
GFR, Estimated: >60 mL/min (ref >60)
Glucose, Bld: 187 mg/dL — ABNORMAL HIGH (ref 70–99)
Potassium: 4.4 mmol/L (ref 3.5–5.1)
Sodium: 135 mmol/L (ref 135–145)
Total Bilirubin: 0.4 mg/dL (ref 0.0–1.2)
Total Protein: 5.8 g/dL — ABNORMAL LOW (ref 6.5–8.1)

## 2023-05-21 LAB — COOXEMETRY PANEL
Carboxyhemoglobin: 1.5 % (ref 0.5–1.5)
Methemoglobin: <0.7 % (ref 0.0–1.5)
O2 Saturation: 54.1 %
Total hemoglobin: 14 g/dL (ref 12.0–16.0)

## 2023-05-21 LAB — MAGNESIUM: Magnesium: 2.3 mg/dL (ref 1.7–2.4)

## 2023-05-21 LAB — BRAIN NATRIURETIC PEPTIDE: B Natriuretic Peptide: 214.6 pg/mL — ABNORMAL HIGH (ref 0.0–100.0)

## 2023-05-21 MED ORDER — FUROSEMIDE 10 MG/ML IJ SOLN
40.0000 mg | Freq: Once | INTRAMUSCULAR | Status: AC
  Administered 2023-05-21: 40 mg via INTRAVENOUS
  Filled 2023-05-21: qty 4

## 2023-05-21 NOTE — Progress Notes (Signed)
 NAME:  Philip [name], MRN:  [id number], DOB:  Jul 04, 1955, LOS: 2 ADMISSION DATE:  05/19/2023, CONSULTATION DATE:  05/13/23 REFERRING MD:  Dania Dupre, CHIEF COMPLAINT:  DOE, hypoxia    History of Present Illness:  68 yo M PMH recurrent adenocarcinoma of the lung - stg III, radiation induced IPF, chronic hypoxic respiratory failure, PE on xarelto , COPD, pulm MAI, bronchiectasis who was admitted to [facility] 05/05/23 for physical deconditioning following hospitalization 3/16-4/2 for AoC hypoxia, AECOPD, Pseudomonas PNA.   Has had incr O2 requirements in rehab, desats with exertion, is slow to recoup.  There are concerns regarding home O2 options.  PCCM is consulted in this setting  Admits to 20 lb (!!) weight gain in past week and increased leg swelling  Pertinent  Medical History  Chronic hypoxic resp failure IPF Lung cancer Bronchiectasis COPD GERD MAI  PE  Chronic AC (xarelto )   Significant Hospital Events: Including procedures, antibiotic start and stop dates in addition to other pertinent events   Previous admissions 3/16 > 4/02 admitted for acute on chronic hypoxic respiratory failure in the setting of insensitive Pseudomonas pneumonia.  Discharged to inpatient rehab on prednisone  taper Cefepime  3/20 to 3/26 Azithromycin  3/16 to 3/17 Ceftriaxone  from 3/16 to 3/19  4/02 >4/16 admitted for acute rehab after discharge from above hospital stay, received aggressive diuretics with pulmonary consult during admission with ~16 liters removed.  Transferred back to inpatient due to increased work of breathing in higher extreme requirements  Current admission 4/16 worsening hypoxia with increased oxygen requirement to 12 L resulted in admission back to inpatient 4/17 Now on 40L HHFN   Interim History / Subjective:  Weaning down on oxygen today, feels about the same.   Objective   Blood pressure (!) 100/50, pulse 95, temperature 98.2 F (36.8 C), temperature source Oral, resp. rate (!) 22,  height 5\' 6"  (1.676 m), weight 89.9 kg, SpO2 91%.    FiO2 (%):  [50 %-65 %] 50 %   Intake/Output Summary (Last 24 hours) at 05/21/2023 1612 Last data filed at 05/21/2023 1254 Gross per 24 hour  Intake 240 ml  Output 920 ml  Net -680 ml   Filed Weights   05/20/23 1036  Weight: 89.9 kg   Physical Exam: General: chronically ill appearing man lying in the recliner talking to family, in NAD HEENT: [place]/AT, eyes anicteric  Neuro: awake, alert, moving all extremities CV: S1S2, RRR  PULM:  faint rhales bilaterally, mild conversational dyspnea, but still able to speak in paragraphs GI: soft, NT Extremities:  no peripheral edema, no cyanosis Skin: warm, dry no rashes  CXR personally reviewed> R effusion, appears chronic since at least 12/2022 based on CT. Effusion slightly larger on 4/17 compared to 4/15. Emphysematous appearing upper lobes with more dense opacities on lower lobes.   Resolved Hospital Problem list   N/A  Assessment & Plan:  Acute on chronic hypoxic respiratory failure -On 3-4L [place] continuously at baseline, more with exertion  Chronic COPD-Steroid dependent, 10mg  po prednisone  at baseline. 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  CT also suggestive of tracheobronchomalacia.  Acute pulmonary edema-During recent IR admission patient was diuresed ~16L from 4/2 to 4/16  History of provoked PE in the setting of malignancy -On Xarelto  at  baseline    Plan/discussion  Unclear exactly what precipitated his acute worsening on 4/16. Aspiration could have precipitated this. Acute pulmonary edema is possible, but should respond to diuresis.   -PICC line in place, check  cooximetry from PICC -Lasix  due to increase in pleural effusion between the last 2 CXR; may have to consider thora at some point if enlarging or not resolving. The effusion does not fully explain his degree of hypoxia.  -con't higher dose steroids -repeat sputum culture -if not improving, may need repeat CT scan after diuresis to euvolemia -if not improving on steroids, need to consider alternative diagnoses of respiratory failure-- progressive NTM, other source of pneumonia -aspiration precautions -cont scheduled bronchodilators -con't DOAC -not currently on antibiotics; monitor for fevers or other indication of infection -needs accurate I/O  PCCM will be available over the weekend; please call if needed. Will plan to follow up on 4/21.  Patient and family updated at bedside during rounds.   Signature:   Joesph Mussel, DO 05/21/23 4:38 PM [facility]  For contact information, see Amion. If no response to pager, please call PCCM consult pager. After hours, 7PM- 7AM, please call Elink.

## 2023-05-21 NOTE — Progress Notes (Signed)
 PROGRESS NOTE  BOOKERT GUZZI OZH:[id number] DOB: 08/17/1955   PCP: [name], MD  Patient is from: CIR  DOA: 05/19/2023 LOS: 2  Chief complaints No chief complaint on file.    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 [name] admitted from CIR due to acute on chronic respiratory failure with hypoxemia felt to be due to acute on chronic cor pulmonale.  Patient was hospitalized from 3/16-4/2 for acute on chronic respiratory failure in the setting of Pseudomonas pneumonia and COPD exacerbation and discharged to CIR for rehabilitation.  While in CIR, he had exertional desaturation and increased oxygen requirement, and PCCM consulted on 4/10 and started diuretics.  However, worsening hypoxemia despite aggressive diuresis with net negative of 16 L that prompted readmission.  On admission, he was requiring 12 L by HFNC.   Currently on Solu-Medrol , scheduled and as needed nebulizers.  Pulmonology following.    Subjective: Seen and examined earlier this morning.  No major events overnight of this morning.  Reports feeling better from breathing standpoint.  Still on 40 L and 60% FiO2 but improved work of breathing.  Able to speak in full sentence.  Objective: Vitals:   05/21/23 0748 05/21/23 0828 05/21/23 1229 05/21/23 1259  BP: 106/82  (!) 100/50   Pulse: 91  95   Resp: (!) 22  (!) 22   Temp: 97.9 F (36.6 C)  98.2 F (36.8 C)   TempSrc: Oral  Oral   SpO2: 99% 95% 92% 91%  Weight:      Height:        Examination:  GENERAL: No apparent distress.  Nontoxic. HEENT: MMM.  Vision and hearing grossly intact.  NECK: Supple.  No apparent JVD.  RESP:  No IWOB.  Fair aeration  bilaterally. CVS:  RRR. Heart sounds normal.  ABD/GI/GU: BS+. Abd soft, NTND.  MSK/EXT:  Moves extremities. No apparent deformity.  Trace pedal edema. SKIN: no apparent skin lesion or wound NEURO: Awake, alert and oriented appropriately.  No apparent focal neuro deficit. PSYCH: Calm. Normal affect.   Consultants:  Pulmonology  Procedures: None  Microbiology summarized: None  Assessment and plan: Acute on chronic respiratory failure with hypoxia due to acute on chronic cor pulmonale: TTE reported with normal RVSF on 3/26.  History of stage III lung cancer and radiation induced IPF, chronic COPD and chronic RF on 4 to 6 L at baseline.  Reportedly quit smoking about 17 months ago. CXR on 4/15 with stable small right pleural effusion with chronic change throughout the lung.  BNP only 200.  Increased oxygen requirement despite aggressive diuresis in rehab.  Repeat CXR today with small right pleural effusion, partially loculated laterally, increasing since prior study; stable extensive chronic densities throughout the lungs, and cardiomegaly. -Appreciate help by pulmonology -Started IV Solu-Medrol  -Continue Brovana , Pulmicort , Yupelri  and as needed DuoNebs -Wean oxygen as able.  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  Followed by Dr. [name]. -Outpatient follow-up.  Recommended stopping on initial consultation on 4/10  Anemia of chronic disease: H&H relatively stable Recent Labs    04/27/23 0415 04/28/23 0419 04/29/23 0407 04/30/23 0340 05/01/23 0438 05/02/23 0350 05/06/23 0558 05/10/23 0509 05/18/23 0519 05/21/23 1000  HGB 11.8* 11.7* 11.3* 12.1* 11.1* 11.8* 10.5* 10.0* 10.7* 9.9*  -Continue monitoring  History of PE -  Continue home Xarelto   Physical deconditioning - PT/OT  BPH - Continue Flomax   Class I obesity Body mass index is 32.01 kg/m.           DVT prophylaxis:   rivaroxaban  (XARELTO ) tablet 20 mg  Code Status: Full code Family Communication: None at best. Level of care: Progressive Status is: Inpatient Remains inpatient appropriate because: Acute on chronic respiratory failure/acute on chronic cor pulmonale   Final disposition: To be determined   55 minutes with more than 50% spent in reviewing records, counseling patient/family and coordinating care.   Sch Meds:  Scheduled Meds:  arformoterol   15 mcg Nebulization BID   budesonide  (PULMICORT ) nebulizer solution  0.5 mg Nebulization BID   Chlorhexidine  Gluconate Cloth  6 each Topical Daily   HYDROcodone -acetaminophen   1 tablet Oral Once   methylPREDNISolone  (SOLU-MEDROL ) injection  60 mg Intravenous Q12H   polyethylene glycol  17 g Oral Daily   revefenacin   175 mcg Nebulization Daily   rivaroxaban   20 mg Oral Q supper   sodium chloride  flush  10-40 mL Intracatheter Q12H   tamsulosin   0.4 mg Oral QHS   Continuous Infusions:   PRN Meds:.alum & mag hydroxide-simeth, antiseptic oral rinse, guaiFENesin -dextromethorphan , hydrALAZINE , HYDROcodone -acetaminophen , ipratropium-albuterol , naphazoline-pheniramine, senna-docusate, sodium chloride  flush  Antimicrobials: Anti-infectives (From admission, onward)    None        I have personally reviewed the following labs and images: CBC: Recent Labs  Lab 05/18/23 0519 05/21/23 1000  WBC 6.9 10.7*  HGB 10.7* 9.9*  HCT 33.6* 31.2*  MCV 92.1 89.7  PLT 255 330   BMP &GFR Recent Labs  Lab 05/15/23 1307 05/17/23 0537 05/18/23 0519 05/19/23 1244 05/21/23 1000  NA 136 138 137 137 135  K 4.0 4.6 4.2 4.5 4.4  CL 92* 93* 93* 94* 94*  CO2 33* 34* 34* 33* 31  GLUCOSE 123* 125* 115* 125* 187*  BUN 18 22 18 20 18   CREATININE 1.20 1.20 1.29* 1.26* 0.90  CALCIUM  9.1 9.1  9.1 9.3 8.8*  MG 2.0 2.0 2.1  --  2.3   Estimated Creatinine Clearance: 83.6 mL/min (by C-G formula based on SCr of 0.9 mg/dL). Liver & Pancreas: Recent Labs  Lab 05/21/23 1000  AST 21  ALT 25  ALKPHOS 68  BILITOT 0.4  PROT 5.8*  ALBUMIN  2.7*   No results for input(s): "LIPASE", "AMYLASE" in the last 168 hours. No results for input(s): "AMMONIA" in the last 168 hours. Diabetic: No results for input(s): "HGBA1C" in the last 72 hours. No results for input(s): "GLUCAP" in the last 168 hours. Cardiac Enzymes: No results for input(s): "CKTOTAL", "CKMB", "CKMBINDEX", "TROPONINI" in the last 168 hours. No results for input(s): "PROBNP" in the last 8760 hours. Coagulation Profile: No results for input(s): "INR", "PROTIME" in the last 168 hours. Thyroid  Function Tests: No results for input(s): "TSH", "T4TOTAL", "FREET4", "T3FREE", "THYROIDAB" in the last 72 hours. Lipid Profile: No results for input(s): "CHOL", "HDL", "LDLCALC", "TRIG", "CHOLHDL", "LDLDIRECT" in the last 72 hours. Anemia Panel: No results for input(s): "VITAMINB12", "FOLATE", "FERRITIN", "TIBC", "IRON", "RETICCTPCT" in the last 72 hours. Urine analysis:    Component Value Date/Time   COLORURINE AMBER (A) 05/12/2023 1200  APPEARANCEUR CLOUDY (A) 05/12/2023 1200   LABSPEC 1.010 05/12/2023 1200   PHURINE 6.0 05/12/2023 1200   GLUCOSEU NEGATIVE 05/12/2023 1200   HGBUR MODERATE (A) 05/12/2023 1200   BILIRUBINUR NEGATIVE 05/12/2023 1200   KETONESUR NEGATIVE 05/12/2023 1200   PROTEINUR 100 (A) 05/12/2023 1200   NITRITE NEGATIVE 05/12/2023 1200   LEUKOCYTESUR NEGATIVE 05/12/2023 1200   Sepsis Labs: Invalid input(s): "PROCALCITONIN", "LACTICIDVEN"  Microbiology: Recent Results (from the past 240 hours)  Urine Culture (for pregnant, neutropenic or urologic patients or patients with an indwelling urinary catheter)     Status: Abnormal   Collection Time: 05/12/23 10:29 AM   Specimen: Urine, Clean Catch  Result Value  Ref Range Status   Specimen Description URINE, CLEAN CATCH  Final   Special Requests NONE  Final   Culture (A)  Final    <10,000 COLONIES/mL INSIGNIFICANT GROWTH Performed at [facility], [address], [place], [place] [address]    Report Status 05/13/2023 FINAL  Final    Radiology Studies: DG CHEST PORT 1 VIEW Result Date: 05/20/2023 CLINICAL DATA:  Hypoxia EXAM: PORTABLE CHEST 1 VIEW COMPARISON:  05/18/2023 FINDINGS: Cardiomegaly. Mediastinal contours within normal limits. Chronic densities throughout the lungs. Layering right pleural effusion, partially loculated laterally, slightly increased since prior study. IMPRESSION: Small right pleural effusion, partially loculated laterally, increasing since prior study. Stable extensive chronic densities throughout the lungs. Cardiomegaly. Electronically Signed   By: Janeece Mechanic M.D.   On: 05/20/2023 17:05      Dawit Tankard T. Alaric Gladwin [facility]  If 7PM-7AM, please contact night-coverage [email or web address] 05/21/2023, 1:13 PM

## 2023-05-22 DIAGNOSIS — J440 Chronic obstructive pulmonary disease with acute lower respiratory infection: Secondary | ICD-10-CM | POA: Diagnosis not present

## 2023-05-22 DIAGNOSIS — C3491 Malignant neoplasm of unspecified part of right bronchus or lung: Secondary | ICD-10-CM | POA: Diagnosis not present

## 2023-05-22 DIAGNOSIS — J9601 Acute respiratory failure with hypoxia: Secondary | ICD-10-CM | POA: Diagnosis not present

## 2023-05-22 DIAGNOSIS — J9621 Acute and chronic respiratory failure with hypoxia: Secondary | ICD-10-CM | POA: Diagnosis not present

## 2023-05-22 LAB — BASIC METABOLIC PANEL WITH GFR
Anion gap: 10 (ref 5–15)
BUN: 24 mg/dL — ABNORMAL HIGH (ref 8–23)
CO2: 31 mmol/L (ref 22–32)
Calcium: 8.4 mg/dL — ABNORMAL LOW (ref 8.9–10.3)
Chloride: 95 mmol/L — ABNORMAL LOW (ref 98–111)
Creatinine, Ser: 0.94 mg/dL (ref 0.61–1.24)
GFR, Estimated: >60 mL/min (ref >60)
Glucose, Bld: 154 mg/dL — ABNORMAL HIGH (ref 70–99)
Potassium: 4.1 mmol/L (ref 3.5–5.1)
Sodium: 136 mmol/L (ref 135–145)

## 2023-05-22 NOTE — Plan of Care (Signed)

## 2023-05-22 NOTE — Progress Notes (Signed)
 PROGRESS NOTE  Philip [name] ZOX:[id number] DOB: 1955/08/04   PCP: [name], MD  Patient is from: CIR  DOA: 05/19/2023 LOS: 3  Chief complaints No chief complaint on file.    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 [name] admitted from CIR due to acute on chronic respiratory failure with hypoxemia felt to be due to acute on chronic cor pulmonale.  Patient was hospitalized from 3/16-4/2 for acute on chronic respiratory failure in the setting of Pseudomonas pneumonia and COPD exacerbation and discharged to CIR for rehabilitation.  While in CIR, he had exertional desaturation and increased oxygen requirement, and PCCM consulted on 4/10 and started diuretics.  However, worsening hypoxemia despite aggressive diuresis with net negative of 16 L that prompted readmission.  On admission, he was requiring 12 L by HFNC.   Currently on Solu-Medrol , scheduled and as needed nebulizers.  Repeat CXR showed stable extensive chronic density throughout the lungs, cardiomegaly and small right pleural effusion partially loculated laterally and increased since prior study.  Pulmonology following and diuresing intermittently.    Subjective: Seen and examined earlier this morning.  No major events overnight of this morning.  No complaints.  Feels better from breathing standpoint.  Down to 10 L by HFNC.  Objective: Vitals:   05/22/23 0747 05/22/23 0844 05/22/23 1318 05/22/23 1323  BP: 91/62   111/82  Pulse: 98   (!) 104  Resp:  (!) 22 18 20   Temp: 97.8 F (36.6 C)   98 F (36.7 C)  TempSrc: Oral   Oral  SpO2: 92%   97%  Weight:      Height:        Examination:  GENERAL: No apparent  distress.  Nontoxic. HEENT: MMM.  Vision and hearing grossly intact.  NECK: Supple.  No apparent JVD.  RESP:  No IWOB.  Fair aeration bilaterally. CVS:  RRR. Heart sounds normal.  ABD/GI/GU: BS+. Abd soft, NTND.  MSK/EXT:  Moves extremities. No apparent deformity.  Trace pedal edema. SKIN: no apparent skin lesion or wound NEURO: Awake, alert and oriented appropriately.  No apparent focal neuro deficit. PSYCH: Calm. Normal affect.   Consultants:  Pulmonology  Procedures: None  Microbiology summarized: None  Assessment and plan: Acute on chronic respiratory failure with hypoxia due to acute on chronic cor pulmonale: TTE reported with normal RVSF on 3/26.  History of stage III lung cancer and radiation induced IPF, chronic COPD and chronic RF on 4 to 6 L at baseline.  Reportedly quit smoking about 17 months ago. CXR on 4/15 with stable small right pleural effusion with chronic change throughout the lung.  BNP only 200.  Increased oxygen requirement despite aggressive diuresis in rehab.  Repeat CXR with small right pleural effusion, partially loculated laterally, increasing since prior study; stable extensive chronic densities throughout the lungs, and cardiomegaly.  Now down to 10 L by HFNC. -Appreciate help by pulmonology -Continue Solu-Medrol , Brovana , Pulmicort , Yupelri  and DuoNeb as needed -PCCM intermittently diuresing. -Continue Brovana , Pulmicort , Yupelri  and as needed DuoNebs -Wean oxygen as able.  Encourage incentive spirometry. 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  Followed by Dr. [name]. -Outpatient follow-up.  Recommended stopping on initial consultation on 4/10  Anemia of chronic disease: H&H relatively stable Recent Labs    04/27/23 0415  04/28/23 0419 04/29/23 0407 04/30/23 0340 05/01/23 0438 05/02/23 0350 05/06/23 0558 05/10/23 0509 05/18/23 0519 05/21/23 1000  HGB 11.8* 11.7* 11.3* 12.1* 11.1* 11.8* 10.5* 10.0* 10.7* 9.9*  -Continue monitoring  History of PE -Continue home Xarelto   Physical deconditioning -PT/OT  BPH -Continue Flomax   Class I obesity Body mass index is 32.01 kg/m.           DVT prophylaxis:   rivaroxaban  (XARELTO ) tablet 20 mg  Code Status: Full code Family Communication: None at best. Level of care: Progressive Status is: Inpatient Remains inpatient appropriate because: Acute on chronic respiratory failure/acute on chronic cor pulmonale   Final disposition: To be determined   55 minutes with more than 50% spent in reviewing records, counseling patient/family and coordinating care.   Sch Meds:  Scheduled Meds:  arformoterol   15 mcg Nebulization BID   budesonide  (PULMICORT ) nebulizer solution  0.5 mg Nebulization BID   Chlorhexidine  Gluconate Cloth  6 each Topical Daily   methylPREDNISolone  (SOLU-MEDROL ) injection  60 mg Intravenous Q12H   polyethylene glycol  17 g Oral Daily   revefenacin   175 mcg Nebulization Daily   rivaroxaban   20 mg Oral Q supper   sodium chloride  flush  10-40 mL Intracatheter Q12H   tamsulosin   0.4 mg Oral QHS   Continuous Infusions:   PRN Meds:.alum & mag hydroxide-simeth, antiseptic oral rinse, guaiFENesin -dextromethorphan , hydrALAZINE , HYDROcodone -acetaminophen , ipratropium-albuterol , naphazoline-pheniramine, senna-docusate, sodium chloride  flush  Antimicrobials: Anti-infectives (From admission, onward)    None        I have personally reviewed the following labs and images: CBC: Recent Labs  Lab 05/18/23 0519 05/21/23 1000  WBC 6.9 10.7*  HGB 10.7* 9.9*  HCT 33.6* 31.2*  MCV 92.1 89.7  PLT 255 330   BMP &GFR Recent Labs  Lab 05/17/23 0537 05/18/23 0519 05/19/23 1244 05/21/23 1000 05/22/23 0430  NA 138 137 137 135  136  K 4.6 4.2 4.5 4.4 4.1  CL 93* 93* 94* 94* 95*  CO2 34* 34* 33* 31 31  GLUCOSE 125* 115* 125* 187* 154*  BUN 22 18 20 18  24*  CREATININE 1.20 1.29* 1.26* 0.90 0.94  CALCIUM  9.1 9.1 9.3 8.8* 8.4*  MG 2.0 2.1  --  2.3  --    Estimated Creatinine Clearance: 80 mL/min (by C-G formula based on SCr of 0.94 mg/dL). Liver & Pancreas: Recent Labs  Lab 05/21/23 1000  AST 21  ALT 25  ALKPHOS 68  BILITOT 0.4  PROT 5.8*  ALBUMIN  2.7*   No results for input(s): "LIPASE", "AMYLASE" in the last 168 hours. No results for input(s): "AMMONIA" in the last 168 hours. Diabetic: No results for input(s): "HGBA1C" in the last 72 hours. No results for input(s): "GLUCAP" in the last 168 hours. Cardiac Enzymes: No results for input(s): "CKTOTAL", "CKMB", "CKMBINDEX", "TROPONINI" in the last 168 hours. No results for input(s): "PROBNP" in the last 8760 hours. Coagulation Profile: No results for input(s): "INR", "PROTIME" in the last 168 hours. Thyroid  Function Tests: No results for input(s): "TSH", "T4TOTAL", "FREET4", "T3FREE", "THYROIDAB" in the last 72 hours. Lipid Profile: No results for input(s): "CHOL", "HDL", "LDLCALC", "TRIG", "CHOLHDL", "LDLDIRECT" in the last 72 hours. Anemia Panel: No results for input(s): "VITAMINB12", "FOLATE", "FERRITIN", "TIBC", "IRON", "  RETICCTPCT" in the last 72 hours. Urine analysis:    Component Value Date/Time   COLORURINE AMBER (A) 05/12/2023 1200   APPEARANCEUR CLOUDY (A) 05/12/2023 1200   LABSPEC 1.010 05/12/2023 1200   PHURINE 6.0 05/12/2023 1200   GLUCOSEU NEGATIVE 05/12/2023 1200   HGBUR MODERATE (A) 05/12/2023 1200   BILIRUBINUR NEGATIVE 05/12/2023 1200   KETONESUR NEGATIVE 05/12/2023 1200   PROTEINUR 100 (A) 05/12/2023 1200   NITRITE NEGATIVE 05/12/2023 1200   LEUKOCYTESUR NEGATIVE 05/12/2023 1200   Sepsis Labs: Invalid input(s): "PROCALCITONIN", "LACTICIDVEN"  Microbiology: No results found for this or any previous visit (from the past 240  hours).   Radiology Studies: No results found.     Shauntelle Jamerson T. Candies Palm [facility]  If 7PM-7AM, please contact night-coverage [email or web address] 05/22/2023, 3:08 PM

## 2023-05-22 NOTE — Evaluation (Addendum)
 Physical Therapy Evaluation Patient Details Name: Philip [name] MRN: [id number] DOB: 1955-02-07 Today's Date: 05/22/2023  History of Present Illness  Pt is 68 year old transferred to acute care at [facility] from inpatient rehab on  05/19/23 for acute on chronic respiratory failure with increasing O2 requirements on rehab. Pt placed on HHFNC. PMH - recurrent lung CA, radiation induced IPF, chronic resp failure, PE, copd.  Clinical Impression  Pt back on acute from inpatient rehab when resp status declined. Currently pt limited to standing and a few steps bedside due to his respiratory status. Pt moves well and doesn't require physical assist but with minimal activity his SpO2 level decr to high 70's/low 80's. Will continue to follow pt. Respiratory status will dictate when pt can return home.         If plan is discharge home, recommend the following: Assist for transportation;Help with stairs or ramp for entrance;Assistance with cooking/housework   Can travel by private vehicle        Equipment Recommendations Rolling walker (2 wheels);Wheelchair (measurements PT);Wheelchair cushion (measurements PT)  Recommendations for Other Services       Functional Status Assessment Patient has had a recent decline in their functional status and demonstrates the ability to make significant improvements in function in a reasonable and predictable amount of time.     Precautions / Restrictions Precautions Precautions: Other (comment) Recall of Precautions/Restrictions: Intact Precaution/Restrictions Comments: monitor O2 sats      Mobility  Bed Mobility Overal bed mobility: Modified Independent Bed Mobility: Supine to Sit, Sit to Supine     Supine to sit: Modified independent (Device/Increase time) Sit to supine: Modified independent (Device/Increase time)        Transfers Overall transfer level: Needs assistance Equipment used: None Transfers: Sit to/from Stand Sit to Stand:  Supervision           General transfer comment: supervision for lines    Ambulation/Gait Ambulation/Gait assistance: Supervision Gait Distance (Feet): 3 Feet Assistive device: None   Gait velocity: decr Gait velocity interpretation: <1.31 ft/sec, indicative of household ambulator   General Gait Details: Side stepped up side of bed  Stairs            Wheelchair Mobility     Tilt Bed    Modified Rankin (Stroke Patients Only)       Balance Overall balance assessment: Mild deficits observed, not formally tested                                           Pertinent Vitals/Pain Pain Assessment Pain Assessment: No/denies pain    Home Living Family/patient expects to be discharged to:: Private residence Living Arrangements: Spouse/significant other Available Help at Discharge: Family;Available 24 hours/day Type of Home: House Home Access: Stairs to enter Entrance Stairs-Rails: None Entrance Stairs-Number of Steps: 2   Home Layout: One level Home Equipment: Shower seat;Grab bars - tub/shower Additional Comments: Uses 6 L O2 at baseline. Has home oxygen concentrator plus large portable tanks.    Prior Function Prior Level of Function : Needs assist             Mobility Comments: Prior to this hospitalization pt ambulatory in home and short community distances pushing O2 tank. Limited by respiratory status. ADLs Comments: independent wtih adls; assist with iadls     Extremity/Trunk Assessment   Upper Extremity Assessment Upper Extremity Assessment: Defer  to OT evaluation    Lower Extremity Assessment Lower Extremity Assessment: Generalized weakness       Communication   Communication Communication: No apparent difficulties    Cognition Arousal: Alert Behavior During Therapy: WFL for tasks assessed/performed   PT - Cognitive impairments: No apparent impairments                         Following commands: Intact        Cueing       General Comments General comments (skin integrity, edema, etc.): Pt on 25L HHFNC. SpO2 92% initially at rest. With activity 76-82% and pt using nonrebreather to recover at times.    Exercises     Assessment/Plan    PT Assessment Patient needs continued PT services  PT Problem List Decreased strength;Decreased activity tolerance;Decreased mobility;Cardiopulmonary status limiting activity       PT Treatment Interventions DME instruction;Gait training;Stair training;Functional mobility training;Therapeutic activities;Therapeutic exercise;Patient/family education    PT Goals (Current goals can be found in the Care Plan section)  Acute Rehab PT Goals PT Goal Formulation: With patient Time For Goal Achievement: 06/05/23 Potential to Achieve Goals: Fair    Frequency Min 2X/week     Co-evaluation               AM-PAC PT "6 Clicks" Mobility  Outcome Measure Help needed turning from your back to your side while in a flat bed without using bedrails?: None Help needed moving from lying on your back to sitting on the side of a flat bed without using bedrails?: None Help needed moving to and from a bed to a chair (including a wheelchair)?: A Little Help needed standing up from a chair using your arms (e.g., wheelchair or bedside chair)?: A Little Help needed to walk in hospital room?: Total Help needed climbing 3-5 steps with a railing? : Total 6 Click Score: 16    End of Session Equipment Utilized During Treatment: Oxygen Activity Tolerance: Treatment limited secondary to medical complications (Comment) Patient left: in bed;with call bell/phone within reach   PT Visit Diagnosis: Difficulty in walking, not elsewhere classified (R26.2)    Time: 4696-2952 PT Time Calculation (min) (ACUTE ONLY): 22 min   Charges:   PT Evaluation $PT Eval Moderate Complexity: 1 Mod   PT General Charges $$ ACUTE PT VISIT: 1 Visit         [facility] PT Acute  Rehabilitation Services Office [phone]   Pura Browns [facility] 05/22/2023, 1:03 PM

## 2023-05-22 NOTE — Plan of Care (Signed)
  Problem: Clinical Measurements: Goal: Diagnostic test results will improve Outcome: Progressing Goal: Respiratory complications will improve Outcome: Progressing Goal: Cardiovascular complication will be avoided Outcome: Progressing   Problem: Coping: Goal: Level of anxiety will decrease Outcome: Progressing   

## 2023-05-22 NOTE — Evaluation (Signed)
 Occupational Therapy Evaluation Patient Details Name: Philip [name] MRN: [id number] DOB: 1955-08-29 Today's Date: 05/22/2023   History of Present Illness   Pt is 68 year old transferred to acute care at [facility] from inpatient rehab on  05/19/23 for acute on chronic respiratory failure with increasing O2 requirements on rehab. Pt placed on HHFNC. PMH - recurrent lung CA, radiation induced IPF, chronic resp failure, PE, copd.     Clinical Impressions Pt admitted with the above diagnoses and presents with below problem list. Pt will benefit from continued acute OT to address the below listed deficits and maximize independence with basic ADLs prior to d/c. At baseline, pt is independent with ADLs, assist with IADLs. Pt limited by respiratory status. Currently needs up to CGA with basic ADLs, only able to take a few steps EOB.      If plan is discharge home, recommend the following:   A little help with walking and/or transfers;A lot of help with bathing/dressing/bathroom;Assistance with cooking/housework;Direct supervision/assist for medications management;Assist for transportation;Help with stairs or ramp for entrance;Direct supervision/assist for financial management     Functional Status Assessment   Patient has had a recent decline in their functional status and demonstrates the ability to make significant improvements in function in a reasonable and predictable amount of time.     Equipment Recommendations   None recommended by OT     Recommendations for Other Services         Precautions/Restrictions   Precautions Precautions: Other (comment) Recall of Precautions/Restrictions: Intact Precaution/Restrictions Comments: monitor O2 sats Restrictions Weight Bearing Restrictions Per Provider Order: No     Mobility Bed Mobility                    Transfers Overall transfer level: Needs assistance                 General transfer comment: pt declined  d/t increased anxiety with mobility and about to eat lunch, up with PT earlier today.      Balance                                           ADL either performed or assessed with clinical judgement   ADL Overall ADL's : Needs assistance/impaired                                             Vision Baseline Vision/History: 1 Wears glasses       Perception         Praxis         Pertinent Vitals/Pain Pain Assessment Pain Assessment: No/denies pain     Extremity/Trunk Assessment Upper Extremity Assessment Upper Extremity Assessment: Overall WFL for tasks assessed;Generalized weakness   Lower Extremity Assessment Lower Extremity Assessment: Defer to PT evaluation       Communication Communication Communication: No apparent difficulties   Cognition Arousal: Alert Behavior During Therapy: WFL for tasks assessed/performed Cognition: No apparent impairments                               Following commands: Intact       Cueing  General Comments      9L HHFNC, SpO2 89-92  while in bed and talking, HR 114   Exercises     Shoulder Instructions      Home Living Family/patient expects to be discharged to:: Private residence Living Arrangements: Spouse/significant other Available Help at Discharge: Family;Available 24 hours/day Type of Home: House Home Access: Stairs to enter [employer] of Steps: 2 Entrance Stairs-Rails: None Home Layout: One level     Bathroom Shower/Tub: Chief Strategy Officer: Standard Bathroom Accessibility: Yes   Home Equipment: Shower seat;Grab bars - tub/shower   Additional Comments: Uses 6 L O2 at baseline. Has home oxygen concentrator plus large portable tanks.  Lives With: Spouse    Prior Functioning/Environment Prior Level of Function : Needs assist             Mobility Comments: Prior to this hospitalization pt ambulatory in home and short  community distances pushing O2 tank. Limited by respiratory status. ADLs Comments: independent wtih adls; assist with iadls    OT Problem List: Decreased strength;Cardiopulmonary status limiting activity;Decreased safety awareness;Decreased activity tolerance;Decreased knowledge of use of DME or AE   OT Treatment/Interventions: Self-care/ADL training;DME and/or AE instruction;Therapeutic activities;Balance training;Therapeutic exercise;Energy conservation;Patient/family education      OT Goals(Current goals can be found in the care plan section)   Acute Rehab OT Goals OT Goal Formulation: With patient Time For Goal Achievement: 06/05/23 Potential to Achieve Goals: Fair ADL Goals Pt Will Perform Upper Body Dressing: with modified independence;sitting Pt Will Perform Lower Body Dressing: with modified independence;sit to/from stand Pt Will Transfer to Toilet: with supervision;ambulating   OT Frequency:  Min 2X/week    Co-evaluation              AM-PAC OT "6 Clicks" Daily Activity     Outcome Measure Help from another person eating meals?: None Help from another person taking care of personal grooming?: A Little Help from another person toileting, which includes using toliet, bedpan, or urinal?: A Little Help from another person bathing (including washing, rinsing, drying)?: A Little Help from another person to put on and taking off regular upper body clothing?: A Little Help from another person to put on and taking off regular lower body clothing?: A Little 6 Click Score: 19   End of Session Equipment Utilized During Treatment: Oxygen Nurse Communication: Mobility status  Activity Tolerance:   Patient left: with call bell/phone within reach;in bed;Other (comment);with family/visitor present (eating lunch)  OT Visit Diagnosis: Unsteadiness on feet (R26.81);Other abnormalities of gait and mobility (R26.89);Pain                Time: 1428-1440 OT Time Calculation (min): 12  min Charges:  OT General Charges $OT Visit: 1 Visit OT Evaluation $OT Eval Moderate Complexity: 1 Mod  Lael Pierce, OT Acute Rehabilitation Services Office: [phone]   Philip [name] 05/22/2023, 3:46 PM

## 2023-05-23 DIAGNOSIS — J9621 Acute and chronic respiratory failure with hypoxia: Secondary | ICD-10-CM | POA: Diagnosis not present

## 2023-05-23 DIAGNOSIS — J9601 Acute respiratory failure with hypoxia: Secondary | ICD-10-CM | POA: Diagnosis not present

## 2023-05-23 DIAGNOSIS — C3491 Malignant neoplasm of unspecified part of right bronchus or lung: Secondary | ICD-10-CM | POA: Diagnosis not present

## 2023-05-23 DIAGNOSIS — J440 Chronic obstructive pulmonary disease with acute lower respiratory infection: Secondary | ICD-10-CM | POA: Diagnosis not present

## 2023-05-23 LAB — BASIC METABOLIC PANEL WITH GFR
Anion gap: 7 (ref 5–15)
BUN: 23 mg/dL (ref 8–23)
CO2: 33 mmol/L — ABNORMAL HIGH (ref 22–32)
Calcium: 8.9 mg/dL (ref 8.9–10.3)
Chloride: 99 mmol/L (ref 98–111)
Creatinine, Ser: 0.95 mg/dL (ref 0.61–1.24)
GFR, Estimated: >60 mL/min (ref >60)
Glucose, Bld: 196 mg/dL — ABNORMAL HIGH (ref 70–99)
Potassium: 4 mmol/L (ref 3.5–5.1)
Sodium: 139 mmol/L (ref 135–145)

## 2023-05-23 NOTE — Progress Notes (Signed)
 PROGRESS NOTE  Philip [name]:[id number] DOB: 05/07/1955   PCP: Philip Coho, MD  Patient is from: CIR  DOA: 05/19/2023 LOS: 4  Chief complaints No chief complaint on file.    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  Patient was hospitalized from 3/16-4/2 for acute on chronic respiratory failure in the setting of Pseudomonas pneumonia and COPD exacerbation and discharged to CIR for rehabilitation.  While in CIR, he had exertional desaturation and increased oxygen requirement, and PCCM consulted on 4/10 and started diuretics.  However, worsening hypoxemia despite aggressive diuresis with net negative of 16 L that prompted readmission.  On admission, he was requiring 12 L by HFNC.   Currently on Solu-Medrol , scheduled and as needed nebulizers.  Repeat CXR showed stable extensive chronic density throughout the lungs, cardiomegaly and small right pleural effusion partially loculated laterally and increased since prior study.  Pulmonology following and diuresing intermittently.    Subjective: Seen and examined earlier this morning.  No major events overnight of this morning.  No complaints.  Feels better from breathing standpoint.  Down to 8 L by HFNC since this morning.  Objective: Vitals:   05/23/23 0556 05/23/23 0927 05/23/23 1435 05/23/23 1439  BP: 112/84   123/80  Pulse: 93 92 (!) 105 (!) 103  Resp: 18 (!) 21 (!) 22   Temp: 98.1 F (36.7 C)   97.9 F (36.6 C)  TempSrc: Oral   Oral  SpO2: 95% 96% 92% 91%  Weight:      Height:         Examination:  GENERAL: No apparent distress.  Nontoxic. HEENT: MMM.  Vision and hearing grossly intact.  NECK: Supple.  No apparent JVD.  RESP:  No IWOB.  Fair aeration bilaterally. CVS:  RRR. Heart sounds normal.  ABD/GI/GU: BS+. Abd soft, NTND.  MSK/EXT:  Moves extremities. No apparent deformity.  Trace pedal edema. SKIN: no apparent skin lesion or wound NEURO: Awake, alert and oriented appropriately.  No apparent focal neuro deficit. PSYCH: Calm. Normal affect.   Consultants:  Pulmonology  Procedures: None  Microbiology summarized: None  Assessment and plan: Acute on chronic respiratory failure with hypoxia due to acute on chronic cor pulmonale: TTE reported with normal RVSF on 3/26.  History of stage III lung cancer and radiation induced IPF, chronic COPD and chronic RF on 4 to 6 L at baseline.  Reportedly quit smoking about 17 months ago. CXR on 4/15 with stable small right pleural effusion with chronic change throughout the lung.  BNP only 200.  Increased oxygen requirement despite aggressive diuresis in rehab.  Repeat CXR with small right pleural effusion, partially loculated laterally, increasing since prior study; stable extensive chronic densities throughout the lungs, and cardiomegaly.  Now on 8 L by HFNC. -Continue Solu-Medrol , Brovana , Pulmicort , Yupelri  and DuoNeb as needed -PCCM intermittently following and diuresing. -Continue Brovana , Pulmicort , Yupelri  and as needed DuoNebs -Wean oxygen as able.  Encourage incentive spirometry. 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  Followed by Philip [name]. -Outpatient follow-up.  Recommended stopping on initial consultation on 4/10  Anemia of chronic disease: H&H relatively stable Recent Labs     04/27/23 0415 04/28/23 0419 04/29/23 0407 04/30/23 0340 05/01/23 0438 05/02/23 0350 05/06/23 0558 05/10/23 0509 05/18/23 0519 05/21/23 1000  HGB 11.8* 11.7* 11.3* 12.1* 11.1* 11.8* 10.5* 10.0* 10.7* 9.9*  -Continue monitoring  History of PE -Continue home Xarelto   Physical deconditioning -PT/OT  BPH -Continue Flomax   Class I obesity Body mass index is 32.01 kg/m.           DVT prophylaxis:   rivaroxaban  (XARELTO ) tablet 20 mg  Code Status: Full code Family Communication: None at best. Level of care: Progressive Status is: Inpatient Remains inpatient appropriate because: Acute on chronic respiratory failure/acute on chronic cor pulmonale   Final disposition: To be determined   55 minutes with more than 50% spent in reviewing records, counseling patient/family and coordinating care.   Sch Meds:  Scheduled Meds:  arformoterol   15 mcg Nebulization BID   budesonide  (PULMICORT ) nebulizer solution  0.5 mg Nebulization BID   Chlorhexidine  Gluconate Cloth  6 each Topical Daily   methylPREDNISolone  (SOLU-MEDROL ) injection  60 mg Intravenous Q12H   polyethylene glycol  17 g Oral Daily   revefenacin   175 mcg Nebulization Daily   rivaroxaban   20 mg Oral Q supper   sodium chloride  flush  10-40 mL Intracatheter Q12H   tamsulosin   0.4 mg Oral QHS   Continuous Infusions:   PRN Meds:.alum & mag hydroxide-simeth, antiseptic oral rinse, guaiFENesin -dextromethorphan , hydrALAZINE , HYDROcodone -acetaminophen , ipratropium-albuterol , naphazoline-pheniramine, senna-docusate, sodium chloride  flush  Antimicrobials: Anti-infectives (From admission, onward)    None        I have personally reviewed the following labs and images: CBC: Recent Labs  Lab 05/18/23 0519 05/21/23 1000  WBC 6.9 10.7*  HGB 10.7* 9.9*  HCT 33.6* 31.2*  MCV 92.1 89.7  PLT 255 330   BMP &GFR Recent Labs  Lab 05/17/23 0537 05/18/23 0519 05/19/23 1244 05/21/23 1000 05/22/23 0430  05/23/23 0146  NA 138 137 137 135 136 139  K 4.6 4.2 4.5 4.4 4.1 4.0  CL 93* 93* 94* 94* 95* 99  CO2 34* 34* 33* 31 31 33*  GLUCOSE 125* 115* 125* 187* 154* 196*  BUN 22 18 20 18  24* 23  CREATININE 1.20 1.29* 1.26* 0.90 0.94 0.95  CALCIUM  9.1 9.1 9.3 8.8* 8.4* 8.9  MG 2.0 2.1  --  2.3  --   --    Estimated Creatinine Clearance: 79.2 mL/min (by C-G formula based on SCr of 0.95 mg/dL). Liver & Pancreas: Recent Labs  Lab 05/21/23 1000  AST 21  ALT 25  ALKPHOS 68  BILITOT 0.4  PROT 5.8*  ALBUMIN  2.7*   No results for input(s): "LIPASE", "AMYLASE" in the last 168 hours. No results for input(s): "AMMONIA" in the last 168 hours. Diabetic: No results for input(s): "HGBA1C" in the last 72 hours. No results for input(s): "GLUCAP" in the last 168 hours. Cardiac Enzymes: No results for input(s): "CKTOTAL", "CKMB", "CKMBINDEX", "TROPONINI" in the last 168 hours. No results for input(s): "PROBNP" in the last 8760 hours. Coagulation Profile: No results for input(s): "INR", "PROTIME" in the last 168 hours. Thyroid  Function Tests: No results for input(s): "TSH", "T4TOTAL", "FREET4", "T3FREE", "THYROIDAB" in the last 72 hours. Lipid Profile: No results for input(s): "CHOL", "HDL", "LDLCALC", "TRIG", "CHOLHDL", "LDLDIRECT" in  the last 72 hours. Anemia Panel: No results for input(s): "VITAMINB12", "FOLATE", "FERRITIN", "TIBC", "IRON", "RETICCTPCT" in the last 72 hours. Urine analysis:    Component Value Date/Time   COLORURINE AMBER (A) 05/12/2023 1200   APPEARANCEUR CLOUDY (A) 05/12/2023 1200   LABSPEC 1.010 05/12/2023 1200   PHURINE 6.0 05/12/2023 1200   GLUCOSEU NEGATIVE 05/12/2023 1200   HGBUR MODERATE (A) 05/12/2023 1200   BILIRUBINUR NEGATIVE 05/12/2023 1200   KETONESUR NEGATIVE 05/12/2023 1200   PROTEINUR 100 (A) 05/12/2023 1200   NITRITE NEGATIVE 05/12/2023 1200   LEUKOCYTESUR NEGATIVE 05/12/2023 1200   Sepsis Labs: Invalid input(s): "PROCALCITONIN",  "LACTICIDVEN"  Microbiology: No results found for this or any previous visit (from the past 240 hours).   Radiology Studies: No results found.     Cardelia Sassano T. Janijah Symons [facility]  If 7PM-7AM, please contact night-coverage [email or web address] 05/23/2023, 5:05 PM

## 2023-05-23 NOTE — Plan of Care (Signed)

## 2023-05-24 ENCOUNTER — Inpatient Hospital Stay (HOSPITAL_COMMUNITY)

## 2023-05-24 DIAGNOSIS — Z7952 Long term (current) use of systemic steroids: Secondary | ICD-10-CM

## 2023-05-24 DIAGNOSIS — J441 Chronic obstructive pulmonary disease with (acute) exacerbation: Secondary | ICD-10-CM

## 2023-05-24 LAB — COOXEMETRY PANEL
Carboxyhemoglobin: 1.6 % — ABNORMAL HIGH (ref 0.5–1.5)
Methemoglobin: <0.7 % (ref 0.0–1.5)
O2 Saturation: 60.5 %
Total hemoglobin: 10.5 g/dL — ABNORMAL LOW (ref 12.0–16.0)

## 2023-05-24 LAB — CBC
HCT: 32.2 % — ABNORMAL LOW (ref 39.0–52.0)
Hemoglobin: 10 g/dL — ABNORMAL LOW (ref 13.0–17.0)
MCH: 28.8 pg (ref 26.0–34.0)
MCHC: 31.1 g/dL (ref 30.0–36.0)
MCV: 92.8 fL (ref 80.0–100.0)
Platelets: 364 10*3/uL (ref 150–400)
RBC: 3.47 MIL/uL — ABNORMAL LOW (ref 4.22–5.81)
RDW: 14.6 % (ref 11.5–15.5)
WBC: 11.9 10*3/uL — ABNORMAL HIGH (ref 4.0–10.5)
nRBC: 0.3 % — ABNORMAL HIGH (ref 0.0–0.2)

## 2023-05-24 LAB — RENAL FUNCTION PANEL
Albumin: 2.6 g/dL — ABNORMAL LOW (ref 3.5–5.0)
Anion gap: 10 (ref 5–15)
BUN: 25 mg/dL — ABNORMAL HIGH (ref 8–23)
CO2: 34 mmol/L — ABNORMAL HIGH (ref 22–32)
Calcium: 8.8 mg/dL — ABNORMAL LOW (ref 8.9–10.3)
Chloride: 97 mmol/L — ABNORMAL LOW (ref 98–111)
Creatinine, Ser: 0.95 mg/dL (ref 0.61–1.24)
GFR, Estimated: >60 mL/min (ref >60)
Glucose, Bld: 209 mg/dL — ABNORMAL HIGH (ref 70–99)
Phosphorus: 3 mg/dL (ref 2.5–4.6)
Potassium: 4.6 mmol/L (ref 3.5–5.1)
Sodium: 141 mmol/L (ref 135–145)

## 2023-05-24 LAB — HEMOGLOBIN A1C
Hgb A1c MFr Bld: 5.6 % (ref 4.8–5.6)
Mean Plasma Glucose: 114.02 mg/dL

## 2023-05-24 LAB — MAGNESIUM: Magnesium: 2.3 mg/dL (ref 1.7–2.4)

## 2023-05-24 MED ORDER — METHYLPREDNISOLONE SODIUM SUCC 125 MG IJ SOLR
60.0000 mg | INTRAMUSCULAR | Status: DC
  Administered 2023-05-25: 60 mg via INTRAVENOUS
  Filled 2023-05-24: qty 2

## 2023-05-24 NOTE — Progress Notes (Signed)
 PROGRESS NOTE  Philip [name] ZOX:[id number] DOB: [birth year]   PCP: [name], MD  Patient is from: CIR  DOA: 05/19/2023 LOS: 5  Chief complaints No chief complaint on file.    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 [name] admitted from CIR due to acute on chronic respiratory failure with hypoxemia felt to be due to acute on chronic cor pulmonale.  Patient was hospitalized from 3/16-4/2 for acute on chronic respiratory failure in the setting of Pseudomonas pneumonia and COPD exacerbation and discharged to CIR for rehabilitation.  While in CIR, he had exertional desaturation and increased oxygen requirement, and PCCM consulted on 4/10 and started diuretics.  However, worsening hypoxemia despite aggressive diuresis with net negative of 16 L that prompted readmission.  On admission, he was requiring 12 L by HFNC.   Currently on Solu-Medrol , scheduled and as needed nebulizers.  Repeat CXR showed stable extensive chronic density throughout the lungs, cardiomegaly and small right pleural effusion partially loculated laterally and increased since prior study.  Patient required up to 45 L 65% FiO2 eventually improved.  Now on several liters by HFNC at rest but desaturates easily with minimal exertion.   Pulmonology following.    Subjective: Seen and examined earlier this morning.  No major events overnight of this morning.  No complaints.  He is napping after breakfast.  Currently saturating in low 90s on 7 L by HFNC at rest.  Objective: Vitals:   05/24/23 0403 05/24/23 0752 05/24/23 0758 05/24/23 1100  BP: 102/82   113/67  Pulse:  90  (!) 102  Resp: 20 (!) 22  20  Temp: 98 F  (36.7 C)   97.9 F (36.6 C)  TempSrc: Axillary   Oral  SpO2:  92% 99% 94%  Weight:      Height:        Examination:  GENERAL: No apparent distress.  Nontoxic. HEENT: MMM.  Vision and hearing grossly intact.  NECK: Supple.  No apparent JVD.  RESP:  No IWOB.  Fair aeration bilaterally. CVS:  RRR. Heart sounds normal.  ABD/GI/GU: BS+. Abd soft, NTND.  MSK/EXT:  Moves extremities. No apparent deformity.  Trace pedal edema. SKIN: no apparent skin lesion or wound NEURO: Awake, alert and oriented appropriately.  No apparent focal neuro deficit. PSYCH: Calm. Normal affect.   Consultants:  Pulmonology  Procedures: None  Microbiology summarized: None  Assessment and plan: Acute on chronic respiratory failure with hypoxia due to acute on chronic cor pulmonale: TTE reported with normal RVSF on 3/26.  History of stage III lung cancer and radiation induced IPF, chronic COPD and chronic RF on 4 to 6 L at baseline.  Reportedly quit smoking about 17 months ago. CXR on 4/15 with stable small right pleural effusion with chronic change throughout the lung.  BNP only 200.  Increased oxygen requirement despite aggressive diuresis in rehab.  Repeat CXR with small right pleural effusion, partially loculated laterally, increasing since prior study; stable extensive chronic densities throughout the lungs, and cardiomegaly.  Required up to 45 L with 65% FiO2.  Now on 7 L by HFNC.  However, he desaturates easily with minimal exertion. -Continue Solu-Medrol , Brovana , Pulmicort , Yupelri  and DuoNeb as needed -  PCCM intermittently following and diuresing. -Wean oxygen as able.  Encourage incentive spirometry.  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  Followed by Dr.  [name]. -Outpatient follow-up.  Recommended stopping on initial consultation on 4/10  Anemia of chronic disease: H&H relatively stable Recent Labs    04/28/23 0419 04/29/23 0407 04/30/23 0340 05/01/23 0438 05/02/23 0350 05/06/23 0558 05/10/23 0509 05/18/23 0519 05/21/23 1000 05/24/23 0240  HGB 11.7* 11.3* 12.1* 11.1* 11.8* 10.5* 10.0* 10.7* 9.9* 10.0*  -Continue monitoring  History of PE -Continue home Xarelto   Physical deconditioning -PT/OT  BPH -Continue Flomax   Class I obesity Body mass index is 32.01 kg/m.           DVT prophylaxis:   rivaroxaban  (XARELTO ) tablet 20 mg  Code Status: Full code Family Communication: None at best. Level of care: Progressive Status is: Inpatient Remains inpatient appropriate because: Acute on chronic respiratory failure/acute on chronic cor pulmonale   Final disposition: To be determined   55 minutes with more than 50% spent in reviewing records, counseling patient/family and coordinating care.   Sch Meds:  Scheduled Meds:  arformoterol   15 mcg Nebulization BID   budesonide  (PULMICORT ) nebulizer solution  0.5 mg Nebulization BID   Chlorhexidine  Gluconate Cloth  6 each Topical Daily   [START ON 05/25/2023] methylPREDNISolone  (SOLU-MEDROL ) injection  60 mg Intravenous Q24H   polyethylene glycol  17 g Oral Daily   revefenacin   175 mcg Nebulization Daily   rivaroxaban   20 mg Oral Q supper   sodium chloride  flush  10-40 mL Intracatheter Q12H   tamsulosin   0.4 mg Oral QHS   Continuous Infusions:   PRN Meds:.alum & mag hydroxide-simeth, antiseptic oral rinse, guaiFENesin -dextromethorphan , hydrALAZINE , HYDROcodone -acetaminophen , ipratropium-albuterol , naphazoline-pheniramine, senna-docusate, sodium chloride  flush  Antimicrobials: Anti-infectives (From admission, onward)    None        I have personally reviewed the following labs and images: CBC: Recent Labs  Lab 05/18/23 0519 05/21/23 1000 05/24/23 0240   WBC 6.9 10.7* 11.9*  HGB 10.7* 9.9* 10.0*  HCT 33.6* 31.2* 32.2*  MCV 92.1 89.7 92.8  PLT 255 330 364   BMP &GFR Recent Labs  Lab 05/18/23 0519 05/19/23 1244 05/21/23 1000 05/22/23 0430 05/23/23 0146 05/24/23 0240  NA 137 137 135 136 139 141  K 4.2 4.5 4.4 4.1 4.0 4.6  CL 93* 94* 94* 95* 99 97*  CO2 34* 33* 31 31 33* 34*  GLUCOSE 115* 125* 187* 154* 196* 209*  BUN 18 20 18  24* 23 25*  CREATININE 1.29* 1.26* 0.90 0.94 0.95 0.95  CALCIUM  9.1 9.3 8.8* 8.4* 8.9 8.8*  MG 2.1  --  2.3  --   --  2.3  PHOS  --   --   --   --   --  3.0   Estimated Creatinine Clearance: 79.2 mL/min (by C-G formula based on SCr of 0.95 mg/dL). Liver & Pancreas: Recent Labs  Lab 05/21/23 1000 05/24/23 0240  AST 21  --   ALT 25  --   ALKPHOS 68  --   BILITOT 0.4  --   PROT 5.8*  --   ALBUMIN  2.7* 2.6*   No results for input(s): "LIPASE", "AMYLASE" in the last 168 hours. No results for input(s): "AMMONIA" in the last 168 hours. Diabetic: Recent Labs    05/24/23  0240  HGBA1C 5.6   No results for input(s): "GLUCAP" in the last 168 hours. Cardiac Enzymes: No results for input(s): "CKTOTAL", "CKMB", "CKMBINDEX", "TROPONINI" in the last 168 hours. No results for input(s): "PROBNP" in the last 8760 hours. Coagulation Profile: No results for input(s): "INR", "PROTIME" in the last 168 hours. Thyroid  Function Tests: No results for input(s): "TSH", "T4TOTAL", "FREET4", "T3FREE", "THYROIDAB" in the last 72 hours. Lipid Profile: No results for input(s): "CHOL", "HDL", "LDLCALC", "TRIG", "CHOLHDL", "LDLDIRECT" in the last 72 hours. Anemia Panel: No results for input(s): "VITAMINB12", "FOLATE", "FERRITIN", "TIBC", "IRON", "RETICCTPCT" in the last 72 hours. Urine analysis:    Component Value Date/Time   COLORURINE AMBER (A) 05/12/2023 1200   APPEARANCEUR CLOUDY (A) 05/12/2023 1200   LABSPEC 1.010 05/12/2023 1200   PHURINE 6.0 05/12/2023 1200   GLUCOSEU NEGATIVE 05/12/2023 1200   HGBUR MODERATE  (A) 05/12/2023 1200   BILIRUBINUR NEGATIVE 05/12/2023 1200   KETONESUR NEGATIVE 05/12/2023 1200   PROTEINUR 100 (A) 05/12/2023 1200   NITRITE NEGATIVE 05/12/2023 1200   LEUKOCYTESUR NEGATIVE 05/12/2023 1200   Sepsis Labs: Invalid input(s): "PROCALCITONIN", "LACTICIDVEN"  Microbiology: No results found for this or any previous visit (from the past 240 hours).   Radiology Studies: No results found.     Dang Mathison T. Rage Beever [facility]  If 7PM-7AM, please contact night-coverage [email or web address] 05/24/2023, 2:07 PM

## 2023-05-24 NOTE — Plan of Care (Signed)
  Problem: Education: ?Goal: Knowledge of disease or condition will improve ?Outcome: Progressing ?Goal: Knowledge of the prescribed therapeutic regimen will improve ?Outcome: Progressing ?  ?Problem: Activity: ?Goal: Will verbalize the importance of balancing activity with adequate rest periods ?Outcome: Progressing ?  ?Problem: Respiratory: ?Goal: Ability to maintain a clear airway will improve ?Outcome: Progressing ?Goal: Levels of oxygenation will improve ?Outcome: Progressing ?Goal: Ability to maintain adequate ventilation will improve ?Outcome: Progressing ?  ?

## 2023-05-24 NOTE — Progress Notes (Signed)
 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   Has had incr O2 requirements in rehab, desats with exertion, is slow to recoup.  There are concerns regarding home O2 options.  PCCM is consulted in this setting  Admits to 20 lb (!!) weight gain in past week and increased leg swelling  Pertinent  Medical History  Chronic hypoxic resp failure IPF Lung cancer Bronchiectasis COPD GERD MAI  PE  Chronic AC (xarelto )   Significant Hospital Events: Including procedures, antibiotic start and stop dates in addition to other pertinent events   Previous admissions 3/16 > 4/02 admitted for acute on chronic hypoxic respiratory failure in the setting of insensitive Pseudomonas pneumonia.  Discharged to inpatient rehab on prednisone  taper Cefepime  3/20 to 3/26 Azithromycin  3/16 to 3/17 Ceftriaxone  from 3/16 to 3/19  4/02 >4/16 admitted for acute rehab after discharge from above hospital stay, received aggressive diuretics with pulmonary consult during admission with ~16 liters removed.  Transferred back to inpatient due to increased work of breathing in higher extreme requirements  Current admission 4/16 worsening hypoxia with increased oxygen requirement to 12 L resulted in admission back to inpatient 4/17 Now on 40L HHFN  4/21 oxygen requirements have improved nicely with diuresis and steroids. Now down to 7L nasal cannula.   Interim History / Subjective:  No complaints, feeling better.   Objective   Blood pressure 113/67, pulse  (!) 102, temperature 97.9 F (36.6 C), temperature source Oral, resp. rate 20, height 5\' 6"  (1.676 m), weight 89.9 kg, SpO2 94%.        Intake/Output Summary (Last 24 hours) at 05/24/2023 1149 Last data filed at 05/23/2023 2314 Gross per 24 hour  Intake 960 ml  Output 675 ml  Net 285 ml   Filed Weights   05/20/23 1036  Weight: 89.9 kg   Physical Exam: [name]: Middle aged appearing male in NAD. Sleeping. Easily arouses.  HEENT: [place]/AT, PERRL, no JVD Neuro:alert, oriented, non-focal CV: RRR, no MRG PULM:  Satting well on 7L [place]. No distress GI: soft, NT, ND Extremities:  no acute deformity or ROM limitation. No edema.  Skin: warm, dry no rashes  Coox 54 I/O documented as pos x 24 hours and 1.1L negative for the admission.  No weight documented since the 17th CXR pending   Resolved Hospital Problem list   N/A  Assessment & Plan:  Acute on chronic hypoxic respiratory failure -On 3-4L [place] continuously at baseline, more with exertion  Chronic COPD-Steroid dependent, 10mg  po prednisone  at baseline. 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  CT also suggestive of tracheobronchomalacia.  Acute pulmonary edema-During recent IR admission patient was diuresed ~16L from 4/2 to 4/16  History of provoked PE in the setting of malignancy -On Xarelto  at baseline    Plan/discussion  Unclear exactly what precipitated  his acute worsening on 4/16. Aspiration could have precipitated this. Acute pulmonary edema is possible, but should respond to diuresis. He has improved with steroids and diuretics. Seems to be more steroid responsive as he has not received much in the way of  diuretics over the past few days and continues to improve. Now down to 7L.   -PICC line in place, repeat Coox -Check weight -Last diuretic dose 4/17. Need daily weights and accurate I/O to determine if additional need.  -Will need a slow steroid taper back to baseline of prednisone  10mg /day. Start by tapering to solumedrol 60mg  daily today.  -With prolonged steroids we may need to consider PJP prophylaxis.  -Productive cough today, will send for culture.  -Aspiration precautions -Scheduled bronchodilators -DOAC   Signature:    Roz Cornelia, AGACNP-BC [facility]  See Amion for personal pager PCCM on call pager [phone] until 7pm. Please call Elink 7p-7a. [phone]  05/24/2023 12:32 PM

## 2023-05-24 NOTE — Progress Notes (Addendum)
 Inpatient Rehab Admissions Coordinator:   Per therapy recommendations, patient was screened for CIR candidacy by .Wandalee Gust, MS, CCC-SLP.  He recently transferred off of CIR due to respiratory status. He has met his rehab needs though continues to be limited by respiratory status. Recommend that pt. D/c home once stable from a respiratory status.    Wandalee Gust, MS, CCC-SLP Rehab Admissions Coordinator  [phone] (celll) [phone] (office)

## 2023-05-24 NOTE — TOC CM/SW Note (Signed)
 Transition of Care [facility]) - Inpatient Brief Assessment   Patient Details  Name: Philip [name] MRN: [id number] Date of Birth: 1955-10-09  [facility] CM/SW Contact:    Tom-Johnson, Angelique Ken, RN Phone Number: 05/24/2023, 5:06 PM   Clinical Narrative:  Patient presented to the ED from CIR d/t worsening Respiratory Failure. Patient was recently admitted with PNA and COPD, treated and discharged to [facility] for rehab. Patient was admitted on 12L O2, now on 7L O2. Patient is on 6L home O2 from [facility].    Currently on Solu-Medrol  and Neb tx.    Patient lives with wife, has three supportive daughters. On Short term disability from work.  CIR screened patient, recommends patient D/c home once Medically stable.  Home health recommended, patient states he is active with [facility]. CM called in resumption of care referral and Amy voiced acceptance, info on AVS.    Patient not Medically ready for discharge.  CM will continue to follow as patient progresses with care towards discharge.           Transition of Care Asessment: Insurance and Status: Insurance coverage has been reviewed Patient has primary care physician: Yes Home environment has been reviewed: From home with wife, recent CIR Prior level of function:: Modified Independent Prior/Current Home Services: Current home services Social Drivers of Health Review: SDOH reviewed no interventions necessary Readmission risk has been reviewed: Yes Transition of care needs: transition of care needs identified, TOC will continue to follow

## 2023-05-25 ENCOUNTER — Inpatient Hospital Stay

## 2023-05-25 ENCOUNTER — Inpatient Hospital Stay (HOSPITAL_COMMUNITY)

## 2023-05-25 ENCOUNTER — Telehealth: Payer: Self-pay | Admitting: Critical Care Medicine

## 2023-05-25 LAB — CBC
HCT: 32 % — ABNORMAL LOW (ref 39.0–52.0)
Hemoglobin: 9.8 g/dL — ABNORMAL LOW (ref 13.0–17.0)
MCH: 28.1 pg (ref 26.0–34.0)
MCHC: 30.6 g/dL (ref 30.0–36.0)
MCV: 91.7 fL (ref 80.0–100.0)
Platelets: 352 10*3/uL (ref 150–400)
RBC: 3.49 MIL/uL — ABNORMAL LOW (ref 4.22–5.81)
RDW: 14.6 % (ref 11.5–15.5)
WBC: 10 10*3/uL (ref 4.0–10.5)
nRBC: 0.4 % — ABNORMAL HIGH (ref 0.0–0.2)

## 2023-05-25 LAB — RENAL FUNCTION PANEL
Albumin: 2.5 g/dL — ABNORMAL LOW (ref 3.5–5.0)
Anion gap: 5 (ref 5–15)
BUN: 26 mg/dL — ABNORMAL HIGH (ref 8–23)
CO2: 32 mmol/L (ref 22–32)
Calcium: 8.1 mg/dL — ABNORMAL LOW (ref 8.9–10.3)
Chloride: 100 mmol/L (ref 98–111)
Creatinine, Ser: 0.9 mg/dL (ref 0.61–1.24)
GFR, Estimated: >60 mL/min (ref >60)
Glucose, Bld: 109 mg/dL — ABNORMAL HIGH (ref 70–99)
Phosphorus: 3 mg/dL (ref 2.5–4.6)
Potassium: 4.4 mmol/L (ref 3.5–5.1)
Sodium: 137 mmol/L (ref 135–145)

## 2023-05-25 LAB — MAGNESIUM: Magnesium: 2.4 mg/dL (ref 1.7–2.4)

## 2023-05-25 MED ORDER — PREDNISONE 20 MG PO TABS: 30.0000 mg | ORAL_TABLET | Freq: Every day | ORAL | Status: DC

## 2023-05-25 MED ORDER — PREDNISONE 20 MG PO TABS
60.0000 mg | ORAL_TABLET | Freq: Every day | ORAL | Status: AC
  Administered 2023-05-26 – 2023-05-27 (×2): 60 mg via ORAL
  Filled 2023-05-25 (×2): qty 3

## 2023-05-25 MED ORDER — PREDNISONE 10 MG PO TABS: 10.0000 mg | ORAL_TABLET | Freq: Every day | ORAL | Status: DC

## 2023-05-25 MED ORDER — PREDNISONE 20 MG PO TABS
40.0000 mg | ORAL_TABLET | Freq: Every day | ORAL | Status: DC
  Administered 2023-05-28: 40 mg via ORAL
  Filled 2023-05-25: qty 2

## 2023-05-25 MED ORDER — PREDNISONE 20 MG PO TABS: 20.0000 mg | ORAL_TABLET | Freq: Every day | ORAL | Status: DC

## 2023-05-25 NOTE — Progress Notes (Signed)
 Physical Therapy Treatment Patient Details Name: Philip [name] MRN: [id number] DOB: [birth year] Today's Date: 05/25/2023   History of Present Illness Pt is 68 year old transferred to acute care at [facility] from inpatient rehab on  05/19/23 for acute on chronic respiratory failure with increasing O2 requirements on rehab. Pt placed on HHFNC. PMH - recurrent lung CA, radiation induced IPF, chronic resp failure, PE, copd.    PT Comments  Patient progressing to in room ambulation.  Able to maintain SpO2 >90% on NRB with short distance ambulation though dropped significantly with trial on 6L HFNC.  Seems eager to progress to home, but knows he is not ready and does not want to d/c and come right back.  Encouraged to continue practicing mobility and he adjusts the O2 on his own, though understands cannot d/c on >6L.  PT will continue to follow.  Should progress to home with HHPT at d/c.      If plan is discharge home, recommend the following: Assist for transportation;Help with stairs or ramp for entrance;Assistance with cooking/housework   Can travel by private vehicle        Equipment Recommendations  Other (comment);Rollator (4 wheels) Armed forces technical officer)    Recommendations for Other Services       Precautions / Restrictions Precautions Recall of Precautions/Restrictions: Intact Precaution/Restrictions Comments: monitor O2 sats     Mobility  Bed Mobility Overal bed mobility: Modified Independent                  Transfers     Transfers: Sit to/from Stand Sit to Stand: Supervision                Ambulation/Gait Ambulation/Gait assistance: Contact guard assist Gait Distance (Feet): 20 Feet (& 15' x 2) Assistive device: None (pushing O2 tank) Gait Pattern/deviations: Step-through pattern, Decreased stride length, Trunk flexed       General Gait Details: S to CGA for balance, pt using O2 tank for support though no real LOB, mainly supported due to pausing for SOB;  first walk on 100% NRB with SpO2 98%; second walk on NRB tank at 10LPM SpO2 93%; third on 6L HFNC SpO2 drop to 77%, pt increased rate to 10L and back up to 85% in standing to walk back to bed and then up to 92% back on 6L.   Stairs             Wheelchair Mobility     Tilt Bed    Modified Rankin (Stroke Patients Only)       Balance Overall balance assessment: Needs assistance   Sitting balance-Leahy Scale: Good       Standing balance-Leahy Scale: Good                              Communication Communication Communication: Impaired Factors Affecting Communication: Hearing impaired  Cognition Arousal: Alert Behavior During Therapy: WFL for tasks assessed/performed   PT - Cognitive impairments: No apparent impairments                         Following commands: Intact      Cueing    Exercises Other Exercises Other Exercises: sit<>stand x 3 reps, no UE support pausing between reps on NRB    General Comments        Pertinent Vitals/Pain Pain Assessment Pain Assessment: No/denies pain    Home Living  Prior Function            PT Goals (current goals can now be found in the care plan section) Progress towards PT goals: Progressing toward goals    Frequency    Min 2X/week      PT Plan      Co-evaluation              AM-PAC PT "6 Clicks" Mobility   Outcome Measure  Help needed turning from your back to your side while in a flat bed without using bedrails?: None Help needed moving from lying on your back to sitting on the side of a flat bed without using bedrails?: None Help needed moving to and from a bed to a chair (including a wheelchair)?: A Little Help needed standing up from a chair using your arms (e.g., wheelchair or bedside chair)?: A Little Help needed to walk in hospital room?: A Little Help needed climbing 3-5 steps with a railing? : Total 6 Click Score: 18    End  of Session Equipment Utilized During Treatment: Oxygen;Gait belt Activity Tolerance: Treatment limited secondary to medical complications (Comment) Patient left: in bed;with call bell/phone within reach   PT Visit Diagnosis: Difficulty in walking, not elsewhere classified (R26.2)     Time: 0981-1914 PT Time Calculation (min) (ACUTE ONLY): 37 min  Charges:    $Gait Training: 8-22 mins $Therapeutic Activity: 8-22 mins PT General Charges $$ ACUTE PT VISIT: 1 Visit                     Abigail Hoff, PT Acute Rehabilitation Services Office:[phone] 05/25/2023    Marley Simmers 05/25/2023, 6:22 PM

## 2023-05-25 NOTE — Progress Notes (Signed)
 SATURATION QUALIFICATIONS:   Patient Saturations on 5 L at Rest = 89-92%%  Patient Saturations  while Transferring on 8L  = 78%  Patient Saturations on 8L Liters of oxygen while Ambulating = 78%  Pt then able to bring back up using flutter and deep diaphragm breathing on 8L via [place] to 92%. Pt then left on 6L with o2 at 92% sitting in chair.  Erving Heather OTR/L  Acute Rehab Services  [phone] office number

## 2023-05-25 NOTE — Progress Notes (Signed)
 Occupational Therapy Treatment Patient Details Name: Philip [name] MRN: [id number] DOB: [birth year] Today's Date: 05/25/2023   History of present illness Pt is 68 year old transferred to acute care at [facility] from inpatient rehab on  05/19/23 for acute on chronic respiratory failure with increasing O2 requirements on rehab. Pt placed on HHFNC. PMH - recurrent lung CA, radiation induced IPF, chronic resp failure, PE, copd.   OT comments  Pt at this time presented in bed and agreeable to session but took increase in time. However, with increase in time was able to complete long sitting and donned socks on BLE then was able to complete bed mobility with HOB elevated, He agreed to complete transfer OOB to chair with supervision to CGA with DME. Pt at this time reporting his BLE feel like they can not ambulate at this time. He was educated further on energy conservation methods at this time with the return to home. Pt at this time recommendation for [facility] services.   Pt's O2 at rest was 89-92% at 5L  Pt requested o2 changed to 8L prior for transfer and destated to 78% but was able to bring back up using flutter and deep diaphragm breathing. Pt then left on 6L with o2 at 92% sitting in chair.       If plan is discharge home, recommend the following:      Equipment Recommendations   (transport chair)    Recommendations for Other Services      Precautions / Restrictions Precautions Precautions: Other (comment) Recall of Precautions/Restrictions: Intact Precaution/Restrictions Comments: monitor O2 sats Restrictions Weight Bearing Restrictions Per Provider Order: No       Mobility Bed Mobility Overal bed mobility: Modified Independent Bed Mobility: Supine to Sit     Supine to sit: Supervision, HOB elevated          Transfers Overall transfer level: Needs assistance Equipment used: None Transfers: Sit to/from Stand Sit to Stand: Supervision           General transfer comment:  pt agreeable for OOB activity but fearful for mobility as not weakness but "the supply of o2 to legs"     Balance Overall balance assessment: Mild deficits observed, not formally tested Sitting-balance support: Feet supported Sitting balance-Leahy Scale: Good     Standing balance support: No upper extremity supported Standing balance-Leahy Scale: Fair                             ADL either performed or assessed with clinical judgement   ADL Overall ADL's : Needs assistance/impaired Eating/Feeding: Modified independent;Sitting   Grooming: Wash/dry face;Sitting Grooming Details (indicate cue type and reason): simulation Upper Body Bathing: Set up;Sitting   Lower Body Bathing: Set up;Bed level Lower Body Bathing Details (indicate cue type and reason): long sitting Upper Body Dressing : Set up;Sitting   Lower Body Dressing: Set up;Bed level Lower Body Dressing Details (indicate cue type and reason): long sitting Toilet Transfer: Contact guard assist Toilet Transfer Details (indicate cue type and reason): simulation over to chair                Extremity/Trunk Assessment Upper Extremity Assessment Upper Extremity Assessment: Overall WFL for tasks assessed   Lower Extremity Assessment Lower Extremity Assessment: Defer to PT evaluation        Vision   Vision Assessment?: Wears glasses for reading   Perception Perception Perception: Within Functional Limits   Praxis Praxis Praxis: [facility]  Communication Communication Communication: No apparent difficulties   Cognition Arousal: Alert Behavior During Therapy: WFL for tasks assessed/performed Cognition: No apparent impairments             OT - Cognition Comments: cooperative but frustrated about health                 Following commands: Intact        Cueing      Exercises      Shoulder Instructions       General Comments      Pertinent Vitals/ Pain       Pain Assessment Pain  Assessment: No/denies pain  Home Living                                          Prior Functioning/Environment              Frequency  Min 2X/week        Progress Toward Goals  OT Goals(current goals can now be found in the care plan section)  Progress towards OT goals: Progressing toward goals  Acute Rehab OT Goals OT Goal Formulation: With patient Time For Goal Achievement: 06/05/23 Potential to Achieve Goals: Fair ADL Goals Pt Will Perform Upper Body Dressing: with modified independence;sitting Pt Will Perform Lower Body Dressing: with modified independence;sit to/from stand Pt Will Transfer to Toilet: with supervision;ambulating Pt Will Perform Toileting - Clothing Manipulation and hygiene: with modified independence;sit to/from stand  Plan      Co-evaluation                 AM-PAC OT "6 Clicks" Daily Activity     Outcome Measure   Help from another person eating meals?: None Help from another person taking care of personal grooming?: A Little Help from another person toileting, which includes using toliet, bedpan, or urinal?: A Little Help from another person bathing (including washing, rinsing, drying)?: A Little Help from another person to put on and taking off regular upper body clothing?: A Little Help from another person to put on and taking off regular lower body clothing?: A Little 6 Click Score: 19    End of Session Equipment Utilized During Treatment: Oxygen  OT Visit Diagnosis: Unsteadiness on feet (R26.81);Other abnormalities of gait and mobility (R26.89);Pain   Activity Tolerance Other (comment) (o2)   Patient Left in chair;with call bell/phone within reach   Nurse Communication Mobility status;Other (comment) (o2)        Time: 1610-9604 OT Time Calculation (min): 55 min  Charges: OT General Charges $OT Visit: 1 Visit OT Treatments $Self Care/Home Management : 53-67 mins  Erving Heather OTR/L  Acute Rehab  Services  [phone] office number   Stevphen Elders 05/25/2023, 12:38 PM

## 2023-05-25 NOTE — Progress Notes (Signed)
 PROGRESS NOTE  ETHER WOLTERS EXB:[id number] DOB: [birth year]   PCP: [name], MD  Patient is from: [facility]  DOA: 05/19/2023 LOS: 6  Chief complaints No chief complaint on file.    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 [name] admitted from CIR due to acute on chronic respiratory failure with hypoxemia felt to be due to acute on chronic cor pulmonale.  Patient was hospitalized from 3/16-4/2 for acute on chronic respiratory failure in the setting of Pseudomonas pneumonia and COPD exacerbation and discharged to CIR for rehabilitation.  While in CIR, he had exertional desaturation and increased oxygen requirement, and PCCM consulted on 4/10 and started diuretics.  However, worsening hypoxemia despite aggressive diuresis with net negative of 16 L that prompted readmission.  On admission, he was requiring 12 L by HFNC.   Currently on Solu-Medrol , scheduled and as needed nebulizers.  Repeat CXR showed stable extensive chronic density throughout the lungs, cardiomegaly and small right pleural effusion partially loculated laterally and increased since prior study.  Patient required up to 45 L 65% FiO2 but eventually improved.  Now on 5 L by HFNC at rest but desaturated to 70s with transfer on a liter.  CT chest on 4/21 with multiple areas of abnormality but fairly stable compared to prior CT last month.  Pulmonology recommended prednisone  taper and outpatient follow-up    Subjective: Seen and examined earlier this morning.  No major events overnight of this morning.  No complaints.  Currently saturating in upper 80s to lower 90s on 5 L at rest.  Desaturated to 70s with exertion on 8  liter.  Objective: Vitals:   05/25/23 0054 05/25/23 0345 05/25/23 0751 05/25/23 0847  BP: 102/70   108/80  Pulse: 98  98 (!) 103  Resp: 20 20 (!) 22 20  Temp: 98 F (36.7 C)   97.8 F (36.6 C)  TempSrc: Oral   Oral  SpO2: 96%  97%   Weight:  86.9 kg    Height:        Examination:  GENERAL: No apparent distress.  Nontoxic. HEENT: MMM.  Vision and hearing grossly intact.  NECK: Supple.  No apparent JVD.  RESP:  No IWOB.  Fair aeration bilaterally. CVS:  RRR. Heart sounds normal.  ABD/GI/GU: BS+. Abd soft, NTND.  MSK/EXT:  Moves extremities. No apparent deformity.  Trace pedal edema. SKIN: no apparent skin lesion or wound NEURO: Awake, alert and oriented appropriately.  No apparent focal neuro deficit. PSYCH: Calm. Normal affect.   Consultants:  Pulmonology  Procedures: None  Microbiology summarized: None  Assessment and plan: Acute on chronic respiratory failure with hypoxia due to acute on chronic cor pulmonale: TTE reported with normal RVSF on 3/26.  History of stage III lung cancer and radiation induced IPF, chronic COPD and chronic RF on 4 to 6 L at baseline.  Reportedly quit smoking about 17 months ago. CXR on 4/15 with stable small right pleural effusion with chronic change throughout the lung.  BNP only 200.  Increased oxygen requirement despite aggressive diuresis in rehab.  Repeat CXR with small right pleural effusion, partially loculated laterally, increasing since prior study; stable extensive chronic densities throughout the lungs, and cardiomegaly.  Required up to 45 L with 65% FiO2.  Now on 5 L.  However, he desaturates easily with minimal exertion. -Pulmonology recommended prednisone  taper-ordered. -Continue  Brovana , Pulmicort , Yupelri  and DuoNeb as needed -Wean oxygen as able.  Encourage incentive spirometry.  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  Followed by Dr. [name]. -Outpatient follow-up.  Recommended stopping on initial consultation on 4/10  Anemia of chronic disease: H&H relatively stable Recent Labs    04/29/23 0407 04/30/23 0340 05/01/23 0438 05/02/23 0350 05/06/23 0558 05/10/23 0509 05/18/23 0519 05/21/23 1000 05/24/23 0240 05/25/23 0357  HGB 11.3* 12.1* 11.1* 11.8* 10.5* 10.0* 10.7* 9.9* 10.0* 9.8*  -Continue monitoring  History of PE -Continue home Xarelto   Physical deconditioning -PT/OT  BPH -Continue Flomax   Class I obesity Body mass index is 30.93 kg/m.           DVT prophylaxis:   rivaroxaban  (XARELTO ) tablet 20 mg  Code Status: Full code Family Communication: None at best. Level of care: Progressive Status is: Inpatient Remains inpatient appropriate because: Acute on chronic respiratory failure/acute on chronic cor pulmonale   Final disposition: To be determined   55 minutes with more than 50% spent in reviewing records, counseling patient/family and coordinating care.   Sch Meds:  Scheduled Meds:  arformoterol   15 mcg Nebulization BID   budesonide  (PULMICORT ) nebulizer solution  0.5 mg Nebulization BID   Chlorhexidine  Gluconate Cloth  6 each Topical Daily   methylPREDNISolone  (SOLU-MEDROL ) injection  60 mg Intravenous Q24H   polyethylene glycol  17 g Oral Daily   revefenacin   175 mcg Nebulization Daily   rivaroxaban   20 mg Oral Q supper   sodium chloride  flush  10-40 mL Intracatheter Q12H   tamsulosin   0.4 mg Oral QHS   Continuous Infusions:   PRN Meds:.alum & mag hydroxide-simeth, antiseptic oral rinse, guaiFENesin -dextromethorphan , hydrALAZINE , HYDROcodone -acetaminophen , ipratropium-albuterol , naphazoline-pheniramine, senna-docusate, sodium chloride  flush  Antimicrobials: Anti-infectives (From admission, onward)    None        I  have personally reviewed the following labs and images: CBC: Recent Labs  Lab 05/21/23 1000 05/24/23 0240 05/25/23 0357  WBC 10.7* 11.9* 10.0  HGB 9.9* 10.0* 9.8*  HCT 31.2* 32.2* 32.0*  MCV 89.7 92.8 91.7  PLT 330 364 352   BMP &GFR Recent Labs  Lab 05/21/23 1000 05/22/23 0430 05/23/23 0146 05/24/23 0240 05/25/23 0357  NA 135 136 139 141 137  K 4.4 4.1 4.0 4.6 4.4  CL 94* 95* 99 97* 100  CO2 31 31 33* 34* 32  GLUCOSE 187* 154* 196* 209* 109*  BUN 18 24* 23 25* 26*  CREATININE 0.90 0.94 0.95 0.95 0.90  CALCIUM  8.8* 8.4* 8.9 8.8* 8.1*  MG 2.3  --   --  2.3 2.4  PHOS  --   --   --  3.0 3.0   Estimated Creatinine Clearance: 82.2 mL/min (by C-G formula based on SCr of 0.9 mg/dL). Liver & Pancreas: Recent Labs  Lab 05/21/23 1000 05/24/23 0240 05/25/23 0357  AST 21  --   --   ALT 25  --   --   ALKPHOS 68  --   --   BILITOT 0.4  --   --   PROT 5.8*  --   --   ALBUMIN  2.7* 2.6* 2.5*   No results  for input(s): "LIPASE", "AMYLASE" in the last 168 hours. No results for input(s): "AMMONIA" in the last 168 hours. Diabetic: Recent Labs    05/24/23 0240  HGBA1C 5.6   No results for input(s): "GLUCAP" in the last 168 hours. Cardiac Enzymes: No results for input(s): "CKTOTAL", "CKMB", "CKMBINDEX", "TROPONINI" in the last 168 hours. No results for input(s): "PROBNP" in the last 8760 hours. Coagulation Profile: No results for input(s): "INR", "PROTIME" in the last 168 hours. Thyroid  Function Tests: No results for input(s): "TSH", "T4TOTAL", "FREET4", "T3FREE", "THYROIDAB" in the last 72 hours. Lipid Profile: No results for input(s): "CHOL", "HDL", "LDLCALC", "TRIG", "CHOLHDL", "LDLDIRECT" in the last 72 hours. Anemia Panel: No results for input(s): "VITAMINB12", "FOLATE", "FERRITIN", "TIBC", "IRON", "RETICCTPCT" in the last 72 hours. Urine analysis:    Component Value Date/Time   COLORURINE AMBER (A) 05/12/2023 1200   APPEARANCEUR CLOUDY (A) 05/12/2023 1200    LABSPEC 1.010 05/12/2023 1200   PHURINE 6.0 05/12/2023 1200   GLUCOSEU NEGATIVE 05/12/2023 1200   HGBUR MODERATE (A) 05/12/2023 1200   BILIRUBINUR NEGATIVE 05/12/2023 1200   KETONESUR NEGATIVE 05/12/2023 1200   PROTEINUR 100 (A) 05/12/2023 1200   NITRITE NEGATIVE 05/12/2023 1200   LEUKOCYTESUR NEGATIVE 05/12/2023 1200   Sepsis Labs: Invalid input(s): "PROCALCITONIN", "LACTICIDVEN"  Microbiology: No results found for this or any previous visit (from the past 240 hours).   Radiology Studies: CT CHEST WO CONTRAST Result Date: 05/25/2023 CLINICAL DATA:  Respiratory illness, nondiagnostic xray, hypoxia EXAM: CT CHEST WITHOUT CONTRAST TECHNIQUE: Multidetector CT imaging of the chest was performed following the standard protocol without IV contrast. RADIATION DOSE REDUCTION: This exam was performed according to the departmental dose-optimization program which includes automated exposure control, adjustment of the mA and/or kV according to patient size and/or use of iterative reconstruction technique. COMPARISON:  04/18/2023 FINDINGS: Cardiovascular: Unenhanced imaging of the heart is unremarkable without pericardial effusion. Normal caliber of the thoracic aorta. Atherosclerosis of the aorta and coronary vasculature. Assessment of the vascular lumen cannot be performed without intravenous contrast. Right-sided PICC, tip within the superior vena cava. Mediastinum/Nodes: No enlarged mediastinal or axillary lymph nodes. Thyroid  gland, trachea, and esophagus demonstrate no significant findings. Lungs/Pleura: Upper lobe predominant emphysema. Chronic bilateral perihilar scarring and fibrosis, right greater than left. Stable small right pleural effusion and right pleural thickening. No pneumothorax. Stable bronchiectasis. The central airways are patent. Upper Abdomen: No acute abnormality. Musculoskeletal: No acute or destructive bony abnormalities. Reconstructed images demonstrate no additional findings.  IMPRESSION: 1. Stable small right pleural effusion and right pleural thickening. 2. Stable bilateral perihilar scarring and fibrosis, likely post therapeutic. 3. No acute airspace disease. 4. Aortic Atherosclerosis (ICD10-I70.0) and Emphysema (ICD10-J43.9). Electronically Signed   By: Bobbye Burrow M.D.   On: 05/25/2023 08:54       Rachael Zapanta T. Arva Slaugh [facility]  If 7PM-7AM, please contact night-coverage [email or web address] 05/25/2023, 2:37 PM

## 2023-05-25 NOTE — Telephone Encounter (Signed)
 Post-hospital pulmonology follow up requested.  Joesph Mussel, DO 05/25/23 10:46 AM [facility]

## 2023-05-25 NOTE — Plan of Care (Signed)
  Problem: Education: Goal: Knowledge of disease or condition will improve Outcome: Progressing Goal: Knowledge of the prescribed therapeutic regimen will improve Outcome: Progressing   Problem: Activity: Goal: Ability to tolerate increased activity will improve Outcome: Progressing Goal: Will verbalize the importance of balancing activity with adequate rest periods Outcome: Progressing   Problem: Respiratory: Goal: Ability to maintain a clear airway will improve Outcome: Progressing Goal: Levels of oxygenation will improve Outcome: Progressing Goal: Ability to maintain adequate ventilation will improve Outcome: Progressing   

## 2023-05-25 NOTE — Progress Notes (Signed)
 Ct chest personally reviewed and compared to 3/16 CTA. Multiple areas of abnormality, but overall fairly similar to previous. Areas medially of radiation fibrosis in the R lung, not expanded. No new areas with air bronchograms. Stable pleural disease bilaterally. Significant emphysema. Possibly less GG compared to previous, but could be attributable to differences from contrast administration on previous CT scan. Planning for steriod taper as previously prescribed. Recommend OP follow up in 1 month.  Please call with questions.   Joesph Mussel, DO 05/25/23 10:45 AM [facility]  For contact information, see Amion. If no response to pager, please call PCCM consult pager. After hours, 7PM- 7AM, please call Elink.

## 2023-05-25 NOTE — Plan of Care (Signed)
  Problem: Nutrition: Goal: Adequate nutrition will be maintained Outcome: Progressing   Problem: Skin Integrity: Goal: Risk for impaired skin integrity will decrease Outcome: Progressing   

## 2023-05-25 NOTE — Telephone Encounter (Signed)
 Called patient and scheduled him with [facility] for 5/14.

## 2023-05-26 MED ORDER — FUROSEMIDE 40 MG PO TABS
40.0000 mg | ORAL_TABLET | Freq: Every day | ORAL | Status: DC
  Administered 2023-05-26 – 2023-05-28 (×3): 40 mg via ORAL
  Filled 2023-05-26 (×3): qty 1

## 2023-05-26 NOTE — Progress Notes (Signed)
 Physical Therapy Treatment Patient Details Name: Philip [name] MRN: [id number] DOB: [birth year] Today's Date: 05/26/2023   History of Present Illness Pt is 68 year old transferred to acute care at [facility] from inpatient rehab on  05/19/23 for acute on chronic respiratory failure with increasing O2 requirements on rehab. Pt placed on HHFNC. PMH - recurrent lung CA, radiation induced IPF, chronic resp failure, PE, copd.    PT Comments  Pt greeted supine in bed, pleasant and agreeable to PT session. He completed bed mobility with modI, transfers with supervision, and in-room ambulation using O2 tank for stability with CGA. Pt is highly motivated to regain his independence with functional mobility. His primary limiting factor is cardiopulmonary endurance as he is lacking respiratory reserve. Pt desaturated to 71% SpO2 during gait on 8L O2, required bump up to 15L to recover in standing. Pt desaturated to 83% SpO2 during sit<>stand reps on 8L O2, required prolonged seated rest ~75mins to recover to 88% SpO2 without increase in oxygen. RN notified of pt's response to activity. Will continue to follow acutely and advance appropriately.     If plan is discharge home, recommend the following: Assist for transportation;Help with stairs or ramp for entrance;Assistance with cooking/housework;A little help with walking and/or transfers   Can travel by private vehicle        Equipment Recommendations  Rollator (4 wheels);Other (comment) Armed forces technical officer)    Recommendations for Other Services       Precautions / Restrictions Precautions Precautions: Fall Recall of Precautions/Restrictions: Intact Precaution/Restrictions Comments: Monitor SpO2 Restrictions Weight Bearing Restrictions Per Provider Order: No     Mobility  Bed Mobility Overal bed mobility: Modified Independent Bed Mobility: Supine to Sit           General bed mobility comments: Pt sat up on L side of bed with HOB slightly  elevated. No physical assistance, use of bedrails, or VC for sequencing.    Transfers Overall transfer level: Needs assistance Equipment used: None Transfers: Sit to/from Stand, Bed to chair/wheelchair/BSC Sit to Stand: Supervision   Step pivot transfers: Supervision       General transfer comment: Pt stood from lowest bed height by pushing up with BUE support on knees. No physical assistance to power up and good eccentric control with sitting. Pt completed a total of 12 reps. He was able to perform a maximum of 3 reps in a row before requiring a prolonged seated rest break d/t fatigue and desaturation. Pt transferred from bed to recliner chair on left without an AD, taking short small steps.    Ambulation/Gait Ambulation/Gait assistance: Contact guard assist Gait Distance (Feet): 10 Feet Assistive device: None (pt pushing O2 tank) Gait Pattern/deviations: Step-through pattern, Decreased stride length, Trunk flexed Gait velocity: decreased Gait velocity interpretation: <1.31 ft/sec, indicative of household ambulator   General Gait Details: Pt ambulated from EOB to door and back while using O2 tank for support, CGA for safety/stability.  He took short slow steps and would pause frequently d/t DOE and SOB. Pt requested to use "pickle" during standing rest break to aid in recovery. He demonstrated good insight and self-awareness to his fatigue and desaturation with activity.   Stairs             Wheelchair Mobility     Tilt Bed    Modified Rankin (Stroke Patients Only)       Balance Overall balance assessment: Mild deficits observed, not formally tested  Communication Communication Communication: No apparent difficulties  Cognition Arousal: Alert Behavior During Therapy: WFL for tasks assessed/performed   PT - Cognitive impairments: No apparent impairments                         Following  commands: Intact      Cueing    Exercises      General Comments General comments (skin integrity, edema, etc.): Pt greeted on 6L HFNC. He utilized PLB technique throughout mobility and intermittently utilized "pickle". Pt desaturated to 85% SpO2 upon sitting EOB, increased to 8L and he recovered to 88% SpO2 within 30secs. Pt completed in-room ambulation and desaturated to 71% SpO2. He chose to remain standing during recovery, initially bumped up to 10L, but not sufficient as he recovered into the mid-80s, increased to 15L and recovered within ~48mins to 88% SpO2. Once seated EOB returned pt to 6L at rest. He participated in sit<>stand exercises on 8L and would desaturated to 83% SpO2, requiring ~54mins to recover to 88% SpO2. At end of session pt left in recliner chair on 6L HFNC with SpO2 89%.      Pertinent Vitals/Pain Pain Assessment Pain Assessment: No/denies pain    Home Living                          Prior Function            PT Goals (current goals can now be found in the care plan section) Acute Rehab PT Goals Patient Stated Goal: Increase endurance and respiratory reserve Progress towards PT goals: Progressing toward goals    Frequency    Min 2X/week      PT Plan      Co-evaluation              AM-PAC PT "6 Clicks" Mobility   Outcome Measure  Help needed turning from your back to your side while in a flat bed without using bedrails?: None Help needed moving from lying on your back to sitting on the side of a flat bed without using bedrails?: None Help needed moving to and from a bed to a chair (including a wheelchair)?: A Little Help needed standing up from a chair using your arms (e.g., wheelchair or bedside chair)?: A Little Help needed to walk in hospital room?: A Little Help needed climbing 3-5 steps with a railing? : Total 6 Click Score: 18    End of Session Equipment Utilized During Treatment: Oxygen;Gait belt Activity Tolerance:  Patient tolerated treatment well;Patient limited by fatigue Patient left: in chair;with call bell/phone within reach Nurse Communication: Mobility status;Other (comment) (desaturation with activity) PT Visit Diagnosis: Difficulty in walking, not elsewhere classified (R26.2)     Time: 1610-9604 PT Time Calculation (min) (ACUTE ONLY): 33 min  Charges:    $Gait Training: 8-22 mins $Therapeutic Activity: 8-22 mins PT General Charges $$ ACUTE PT VISIT: 1 Visit                     Glenford Lanes, PT, DPT Acute Rehabilitation Services Office: [phone] Secure Chat Preferred  Riva Chester 05/26/2023, 11:48 AM

## 2023-05-26 NOTE — Progress Notes (Signed)
 PROGRESS NOTE  Philip [name]:[id number] DOB: [birth year]   PCP: [name], MD  Patient is from: [facility]  DOA: 05/19/2023 LOS: 7  Chief complaints No chief complaint on file.    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 [name] admitted from CIR due to acute on chronic respiratory failure with hypoxemia felt to be due to acute on chronic cor pulmonale.  Patient was hospitalized from 3/16-4/2 for acute on chronic respiratory failure in the setting of Pseudomonas pneumonia and COPD exacerbation and discharged to CIR for rehabilitation.  While in CIR, he had exertional desaturation and increased oxygen requirement, and PCCM consulted on 4/10 and started diuretics.  However, worsening hypoxemia despite aggressive diuresis with net negative of 16 L that prompted readmission.  On admission, he was requiring 12 L by HFNC.   Currently on Solu-Medrol , scheduled and as needed nebulizers.  Repeat CXR showed stable extensive chronic density throughout the lungs, cardiomegaly and small right pleural effusion partially loculated laterally and increased since prior study.  Patient required up to 45 L 65% FiO2 but eventually improved.  Now on 5 L by HFNC at rest but desaturated to 70s with transfer on a liter.  CT chest on 4/21 with multiple areas of abnormality but fairly stable compared to prior CT last month.  Pulmonology recommended prednisone  taper and outpatient follow-up.  Still with significant desaturation with minimal activity.    Subjective: Seen and examined earlier this morning.  No major events overnight of this morning.  No complaints.  Desaturated to 83% with ambulation on 8 L and  recovered to 88% after about 3 minutes of rest.   Objective: Vitals:   05/26/23 0348 05/26/23 0819 05/26/23 0918 05/26/23 0921  BP: 112/66 112/80    Pulse: 95     Resp: 16 17    Temp: 98.1 F (36.7 C) 97.9 F (36.6 C)    TempSrc: Oral Oral    SpO2: 96% 91% 95% 97%  Weight: (P) 90.2 kg     Height:        Examination:  GENERAL: No apparent distress.  Nontoxic. HEENT: MMM.  Vision and hearing grossly intact.  NECK: Supple.  No apparent JVD.  RESP:  No IWOB.  Fair aeration bilaterally. CVS:  RRR. Heart sounds normal.  ABD/GI/GU: BS+. Abd soft, NTND.  MSK/EXT:  Moves extremities. No apparent deformity.  Trace pedal edema. SKIN: no apparent skin lesion or wound NEURO: Awake, alert and oriented appropriately.  No apparent focal neuro deficit. PSYCH: Calm. Normal affect.   Consultants:  Pulmonology  Procedures: None  Microbiology summarized: None  Assessment and plan: Acute on chronic respiratory failure with hypoxia due to acute on chronic cor pulmonale: TTE reported with normal RVSF on 3/26.  History of stage III lung cancer and radiation induced IPF, chronic COPD and chronic RF on 4 to 6 L at baseline.  Reportedly quit smoking about 17 months ago. CXR on 4/15 with stable small right pleural effusion with chronic change throughout the lung.  BNP only 200.  Increased oxygen requirement despite aggressive diuresis in rehab.  Repeat CXR with small right pleural effusion, partially loculated laterally, increasing since prior study; stable extensive chronic densities throughout the  lungs, and cardiomegaly.  Required up to 45 L with 65% FiO2.  Now on 5 L at rest.  However, he desaturates easily with minimal exertion. -Pulmonology recommended prednisone  taper-ordered. -Continue  Brovana , Pulmicort , Yupelri  and DuoNeb as needed -Added p.o. Lasix  40 mg daily. -Wean oxygen as able.  Encourage incentive spirometry.  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  Followed by Dr. [name]. -Outpatient follow-up.  Recommended stopping on initial consultation on 4/10  Anemia of chronic disease: H&H relatively stable Recent Labs    04/29/23 0407 04/30/23 0340 05/01/23 0438 05/02/23 0350 05/06/23 0558 05/10/23 0509 05/18/23 0519 05/21/23 1000 05/24/23 0240 05/25/23 0357  HGB 11.3* 12.1* 11.1* 11.8* 10.5* 10.0* 10.7* 9.9* 10.0* 9.8*  -Continue monitoring  Steroid-induced hyperglycemia: A1c 5.6%.  Hyperglycemia improved after decreasing steroids. -Monitor with basic labs  History of PE -Continue home Xarelto   Physical deconditioning -PT/OT  BPH -Continue Flomax   Class I obesity Body mass index is 32.1 kg/m (pended).           DVT prophylaxis:   rivaroxaban  (XARELTO ) tablet 20 mg  Code Status: Full code Family Communication: None at best. Level of care: Progressive Status is: Inpatient Remains inpatient appropriate because: Acute on chronic respiratory failure/acute on chronic cor pulmonale   Final disposition: Home with home health and DME.   55 minutes with more than 50% spent in reviewing records, counseling patient/family and coordinating care.   Sch Meds:  Scheduled Meds:  arformoterol   15 mcg Nebulization BID   budesonide  (PULMICORT ) nebulizer solution  0.5 mg Nebulization BID   Chlorhexidine  Gluconate Cloth  6 each Topical Daily   furosemide   40 mg Oral Daily   polyethylene glycol  17 g Oral Daily   predniSONE   60 mg Oral Q breakfast   Followed by   Cecily Cohen ON 05/28/2023] predniSONE   40 mg Oral Q breakfast   Followed by   Cecily Cohen ON 06/04/2023] predniSONE   30 mg Oral Q breakfast   Followed by   Cecily Cohen ON 06/11/2023] predniSONE   20 mg Oral Q breakfast   Followed by   Cecily Cohen ON 06/18/2023] predniSONE   10 mg Oral Q  breakfast   revefenacin   175 mcg Nebulization Daily   rivaroxaban   20 mg Oral Q supper   sodium chloride  flush  10-40 mL Intracatheter Q12H   tamsulosin   0.4 mg Oral QHS   Continuous Infusions:   PRN Meds:.alum & mag hydroxide-simeth, antiseptic oral rinse, guaiFENesin -dextromethorphan , hydrALAZINE , HYDROcodone -acetaminophen , ipratropium-albuterol , naphazoline-pheniramine, senna-docusate, sodium chloride  flush  Antimicrobials: Anti-infectives (From admission, onward)    None        I have personally reviewed the following labs and images: CBC: Recent Labs  Lab 05/21/23 1000 05/24/23 0240 05/25/23 0357  WBC 10.7* 11.9* 10.0  HGB 9.9* 10.0* 9.8*  HCT 31.2* 32.2* 32.0*  MCV 89.7 92.8 91.7  PLT 330 364 352   BMP &GFR Recent Labs  Lab 05/21/23 1000 05/22/23 0430 05/23/23 0146 05/24/23 0240 05/25/23 0357  NA 135 136 139 141 137  K 4.4 4.1 4.0 4.6 4.4  CL 94* 95* 99 97* 100  CO2 31 31 33* 34* 32  GLUCOSE 187* 154* 196* 209* 109*  BUN 18 24* 23 25* 26*  CREATININE 0.90 0.94 0.95 0.95 0.90  CALCIUM  8.8* 8.4* 8.9 8.8* 8.1*  MG 2.3  --   --  2.3 2.4  PHOS  --   --   --  3.0 3.0   Estimated Creatinine Clearance: 82.2 mL/min (by C-G formula based on SCr of 0.9 mg/dL). Liver & Pancreas: Recent Labs  Lab 05/21/23 1000 05/24/23 0240 05/25/23 0357  AST 21  --   --   ALT 25  --   --   ALKPHOS 68  --   --   BILITOT 0.4  --   --   PROT 5.8*  --   --   ALBUMIN  2.7* 2.6* 2.5*   No results for input(s): "LIPASE", "AMYLASE" in the last 168 hours. No results for input(s): "AMMONIA" in the last 168 hours. Diabetic: Recent Labs    05/24/23 0240  HGBA1C 5.6   No results for input(s): "GLUCAP" in the last 168 hours. Cardiac Enzymes: No results for input(s): "CKTOTAL", "CKMB", "CKMBINDEX", "TROPONINI" in the last 168 hours. No results for input(s): "PROBNP" in the last 8760 hours. Coagulation Profile: No results for input(s): "INR", "PROTIME" in the last 168  hours. Thyroid  Function Tests: No results for input(s): "TSH", "T4TOTAL", "FREET4", "T3FREE", "THYROIDAB" in the last 72 hours. Lipid Profile: No results for input(s): "CHOL", "HDL", "LDLCALC", "TRIG", "CHOLHDL", "LDLDIRECT" in the last 72 hours. Anemia Panel: No results for input(s): "VITAMINB12", "FOLATE", "FERRITIN", "TIBC", "IRON", "RETICCTPCT" in the last 72 hours. Urine analysis:    Component Value Date/Time   COLORURINE AMBER (A) 05/12/2023 1200   APPEARANCEUR CLOUDY (A) 05/12/2023 1200   LABSPEC 1.010 05/12/2023 1200   PHURINE 6.0 05/12/2023 1200   GLUCOSEU NEGATIVE 05/12/2023 1200   HGBUR MODERATE (A) 05/12/2023 1200   BILIRUBINUR NEGATIVE 05/12/2023 1200   KETONESUR NEGATIVE 05/12/2023 1200   PROTEINUR 100 (A) 05/12/2023 1200   NITRITE NEGATIVE 05/12/2023 1200   LEUKOCYTESUR NEGATIVE 05/12/2023 1200   Sepsis Labs: Invalid input(s): "PROCALCITONIN", "LACTICIDVEN"  Microbiology: No results found for this or any previous visit (from the past 240 hours).   Radiology Studies: No results found.      Inayah Woodin T. Vann Okerlund [facility]  If 7PM-7AM, please contact night-coverage [email or web address] 05/26/2023, 1:26 PM

## 2023-05-26 NOTE — Plan of Care (Signed)

## 2023-05-27 ENCOUNTER — Other Ambulatory Visit (HOSPITAL_COMMUNITY): Payer: Self-pay

## 2023-05-27 DIAGNOSIS — Z7952 Long term (current) use of systemic steroids: Secondary | ICD-10-CM

## 2023-05-27 DIAGNOSIS — J701 Chronic and other pulmonary manifestations due to radiation: Secondary | ICD-10-CM

## 2023-05-27 LAB — RENAL FUNCTION PANEL
Albumin: 2.8 g/dL — ABNORMAL LOW (ref 3.5–5.0)
Anion gap: 9 (ref 5–15)
BUN: 21 mg/dL (ref 8–23)
CO2: 33 mmol/L — ABNORMAL HIGH (ref 22–32)
Calcium: 8.2 mg/dL — ABNORMAL LOW (ref 8.9–10.3)
Chloride: 96 mmol/L — ABNORMAL LOW (ref 98–111)
Creatinine, Ser: 1.05 mg/dL (ref 0.61–1.24)
GFR, Estimated: >60 mL/min (ref >60)
Glucose, Bld: 186 mg/dL — ABNORMAL HIGH (ref 70–99)
Phosphorus: 3.3 mg/dL (ref 2.5–4.6)
Potassium: 3.6 mmol/L (ref 3.5–5.1)
Sodium: 138 mmol/L (ref 135–145)

## 2023-05-27 LAB — MAGNESIUM: Magnesium: 2.2 mg/dL (ref 1.7–2.4)

## 2023-05-27 MED ORDER — POTASSIUM CHLORIDE CRYS ER 20 MEQ PO TBCR
40.0000 meq | EXTENDED_RELEASE_TABLET | Freq: Once | ORAL | Status: AC
Start: 2023-05-27 — End: 2023-05-27
  Administered 2023-05-27: 40 meq via ORAL
  Filled 2023-05-27: qty 2

## 2023-05-27 NOTE — TOC Progression Note (Addendum)
 Transition of Care [facility]) - Progression Note    Patient Details  Name: Philip [name] MRN: [id number] Date of Birth: Jun 15, 1955  Transition of Care [facility]) CM/SW Contact  Graves-Bigelow, Jari Merles, RN Phone Number: 05/27/2023, 4:19 PM  Clinical Narrative: Patient presented for acute hypoxic respiratory failure. PTA patient states he was from home with spouse. Patient has oxygen via [employer]-10 liter and a 5 Liter concentrator. Rx is for 12 liters currently in the home. Patient was previously active with [facility]; Case Manager spoke with the office and the office will not be able to accept the patient back for services. Lennart Quitter feels that the patient needs a higher level of care due to diagnosis. Patient wants to return home with spouse and he is agreeable to look into other agencies in network. Patient asked Case Manager to call his spouse to discuss agencies that are in network. Case Manager left detailed voicemail for the spouse. Case Manager will continue to follow for updates with spouse for a new agency.      Checking with MD regarding if patient will benefit from NIV for home use. Case Manager awaiting MD response.  Expected Discharge Plan: Home w Home Health Services Barriers to Discharge: Continued Medical Work up  Expected Discharge Plan and Services   Discharge Planning Services: CM Consult Post Acute Care Choice: Home Health Living arrangements for the past 2 months: Single Family Home    Social Determinants of Health (SDOH) Interventions SDOH Screenings   Food Insecurity: No Food Insecurity (05/19/2023)  Housing: Low Risk  (05/19/2023)  Transportation Needs: No Transportation Needs (05/19/2023)  Utilities: Not At Risk (05/19/2023)  Social Connections: Moderately Integrated (05/19/2023)  Tobacco Use: Medium Risk (05/19/2023)    Readmission Risk Interventions    05/24/2023    5:05 PM 01/22/2022   11:05 AM 01/14/2022   12:03 PM  Readmission Risk Prevention Plan   Transportation Screening Complete Complete Complete  PCP or Specialist Appt within 5-7 Days  Complete Complete  PCP or Specialist Appt within 3-5 Days Complete    Home Care Screening  Complete Complete  Medication Review (RN CM)  Complete Complete  HRI or Home Care Consult Complete    Social Work Consult for Recovery Care Planning/Counseling Complete    Palliative Care Screening Not Applicable    Medication Review Oceanographer) Referral to Pharmacy

## 2023-05-27 NOTE — Progress Notes (Signed)
 PROGRESS NOTE  Philip [name] ZOX:[id number] DOB: 07-13-1955   PCP: [name], MD  Patient is from: [facility]  DOA: 05/19/2023 LOS: 8  Chief complaints No chief complaint on file.    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 [name] admitted from CIR due to acute on chronic respiratory failure with hypoxemia felt to be due to acute on chronic cor pulmonale.  Patient was hospitalized from 3/16-4/2 for acute on chronic respiratory failure in the setting of Pseudomonas pneumonia and COPD exacerbation and discharged to CIR for rehabilitation.  While in CIR, he had exertional desaturation and increased oxygen requirement, and PCCM consulted on 4/10 and started diuretics.  However, worsening hypoxemia despite aggressive diuresis with net negative of 16 L that prompted readmission.  On admission, he was requiring 12 L by HFNC.   Currently on Solu-Medrol , scheduled and as needed nebulizers.  Repeat CXR showed stable extensive chronic density throughout the lungs, cardiomegaly and small right pleural effusion partially loculated laterally and increased since prior study.  Patient required up to 45 L 65% FiO2 but eventually improved.  Now on 5 L by HFNC at rest but desaturated to 70s with transfer on a liter.  CT chest on 4/21 with multiple areas of abnormality but fairly stable compared to prior CT last month.  Pulmonology recommended prednisone  taper and outpatient follow-up.  Still with significant desaturation with minimal activity.    Subjective: Seen and examined earlier this morning.  No major events overnight of this morning.  Continues to have significant desaturation with minimal  exertion.  Maintains saturation in 90s on 6 L by HFNC at rest.  Objective: Vitals:   05/27/23 0841 05/27/23 0916 05/27/23 1033 05/27/23 1224  BP: 102/73   (!) 90/57  Pulse: (!) 109  (!) 117 (!) 109  Resp: 19   20  Temp: 98.1 F (36.7 C)   98.4 F (36.9 C)  TempSrc: Oral   Oral  SpO2: 92% 90% 92% 98%  Weight:      Height:        Examination:  GENERAL: No apparent distress.  Nontoxic. HEENT: MMM.  Vision and hearing grossly intact.  NECK: Supple.  No apparent JVD.  RESP:  No IWOB.  Fair aeration bilaterally. CVS:  RRR. Heart sounds normal.  ABD/GI/GU: BS+. Abd soft, NTND.  MSK/EXT:  Moves extremities. No apparent deformity.  Trace pedal edema. SKIN: no apparent skin lesion or wound NEURO: Awake, alert and oriented appropriately.  No apparent focal neuro deficit. PSYCH: Calm. Normal affect.   Consultants:  Pulmonology  Procedures: None  Microbiology summarized: None  Assessment and plan: Acute on chronic respiratory failure with hypoxia due to acute on chronic cor pulmonale: TTE reported with normal RVSF on 3/26.  History of stage III lung cancer and radiation induced IPF, chronic COPD and chronic RF on 4 to 6 L at baseline.  Reportedly quit smoking about 17 months ago. CXR on 4/15 with stable small right pleural effusion with chronic change throughout the lung.  BNP only 200.  Increased oxygen requirement despite aggressive diuresis in rehab.  Repeat CXR with small right pleural effusion, partially loculated laterally, increasing since prior study; stable extensive chronic densities throughout the  lungs, and cardiomegaly.  Required up to 45 L with 65% FiO2.  Now on 6 L by HFNC at rest.  However, he desaturates easily with minimal exertion. -Pulmonology recommended prednisone  taper-ordered. -Continue  Brovana , Pulmicort , Yupelri  and DuoNeb as needed -Continue p.o. Lasix  40 mg daily. -Wean oxygen as able.  Encourage incentive spirometry.  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  Followed by Dr. [name]. -Outpatient follow-up.  Recommended stopping on initial consultation on 4/10  Anemia of chronic disease: H&H relatively stable Recent Labs    04/29/23 0407 04/30/23 0340 05/01/23 0438 05/02/23 0350 05/06/23 0558 05/10/23 0509 05/18/23 0519 05/21/23 1000 05/24/23 0240 05/25/23 0357  HGB 11.3* 12.1* 11.1* 11.8* 10.5* 10.0* 10.7* 9.9* 10.0* 9.8*  -Continue monitoring  Steroid-induced hyperglycemia: A1c 5.6%.  Hyperglycemia improved after decreasing steroids. -Monitor with basic labs  History of PE -Continue home Xarelto   Physical deconditioning -PT/OT  BPH -Continue Flomax   Class I obesity Body mass index is 33.09 kg/m.           DVT prophylaxis:   rivaroxaban  (XARELTO ) tablet 20 mg  Code Status: Full code Family Communication: None at best. Level of care: Progressive Status is: Inpatient Remains inpatient appropriate because: Acute on chronic respiratory failure/acute on chronic cor pulmonale   Final disposition: Home with home health and DME.   35 minutes with more than 50% spent in reviewing records, counseling patient/family and coordinating care.   Sch Meds:  Scheduled Meds:  arformoterol   15 mcg Nebulization BID   budesonide  (PULMICORT ) nebulizer solution  0.5 mg Nebulization BID   Chlorhexidine  Gluconate Cloth  6 each Topical Daily   furosemide   40 mg Oral Daily   polyethylene glycol  17 g Oral Daily   [START ON 05/28/2023] predniSONE   40 mg Oral Q breakfast   Followed by   Cecily Cohen ON 06/04/2023] predniSONE   30 mg Oral Q breakfast   Followed by   Cecily Cohen ON 06/11/2023] predniSONE   20 mg Oral Q breakfast   Followed by   Cecily Cohen ON 06/18/2023] predniSONE   10 mg Oral Q breakfast   revefenacin    175 mcg Nebulization Daily   rivaroxaban   20 mg Oral Q supper   sodium chloride  flush  10-40 mL Intracatheter Q12H   tamsulosin   0.4 mg Oral QHS   Continuous Infusions:   PRN Meds:.alum & mag hydroxide-simeth, antiseptic oral rinse, guaiFENesin -dextromethorphan , HYDROcodone -acetaminophen , ipratropium-albuterol , naphazoline-pheniramine, senna-docusate, sodium chloride  flush  Antimicrobials: Anti-infectives (From admission, onward)    None        I have personally reviewed the following labs and images: CBC: Recent Labs  Lab 05/21/23 1000 05/24/23 0240 05/25/23 0357  WBC 10.7* 11.9* 10.0  HGB 9.9* 10.0* 9.8*  HCT 31.2* 32.2* 32.0*  MCV 89.7 92.8 91.7  PLT 330 364 352   BMP &GFR Recent Labs  Lab 05/21/23 1000 05/22/23 0430 05/23/23 0146 05/24/23 0240 05/25/23 0357 05/27/23 0835  NA 135 136 139 141 137 138  K 4.4 4.1 4.0 4.6 4.4 3.6  CL 94* 95* 99 97* 100 96*  CO2 31 31 33* 34* 32 33*  GLUCOSE 187* 154* 196* 209* 109* 186*  BUN 18 24* 23 25* 26* 21  CREATININE 0.90 0.94 0.95 0.95 0.90 1.05  CALCIUM  8.8* 8.4* 8.9 8.8* 8.1* 8.2*  MG 2.3  --   --  2.3 2.4 2.2  PHOS  --   --   --  3.0 3.0 3.3   Estimated Creatinine Clearance: 72.9 mL/min (by C-G formula based on SCr of 1.05 mg/dL). Liver & Pancreas: Recent Labs  Lab 05/21/23 1000 05/24/23 0240 05/25/23 0357 05/27/23 0835  AST 21  --   --   --   ALT 25  --   --   --   ALKPHOS 68  --   --   --   BILITOT 0.4  --   --   --   PROT 5.8*  --   --   --   ALBUMIN  2.7* 2.6* 2.5* 2.8*   No results for input(s): "LIPASE", "AMYLASE" in the last 168 hours. No results for input(s): "AMMONIA" in the last 168 hours. Diabetic: No results for input(s): "HGBA1C" in the last 72 hours.  No results for input(s): "GLUCAP" in the last 168 hours. Cardiac Enzymes: No results for input(s): "CKTOTAL", "CKMB", "CKMBINDEX", "TROPONINI" in the last 168 hours. No results for input(s): "PROBNP" in the last 8760 hours. Coagulation  Profile: No results for input(s): "INR", "PROTIME" in the last 168 hours. Thyroid  Function Tests: No results for input(s): "TSH", "T4TOTAL", "FREET4", "T3FREE", "THYROIDAB" in the last 72 hours. Lipid Profile: No results for input(s): "CHOL", "HDL", "LDLCALC", "TRIG", "CHOLHDL", "LDLDIRECT" in the last 72 hours. Anemia Panel: No results for input(s): "VITAMINB12", "FOLATE", "FERRITIN", "TIBC", "IRON", "RETICCTPCT" in the last 72 hours. Urine analysis:    Component Value Date/Time   COLORURINE AMBER (A) 05/12/2023 1200   APPEARANCEUR CLOUDY (A) 05/12/2023 1200   LABSPEC 1.010 05/12/2023 1200   PHURINE 6.0 05/12/2023 1200   GLUCOSEU NEGATIVE 05/12/2023 1200   HGBUR MODERATE (A) 05/12/2023 1200   BILIRUBINUR NEGATIVE 05/12/2023 1200   KETONESUR NEGATIVE 05/12/2023 1200   PROTEINUR 100 (A) 05/12/2023 1200   NITRITE NEGATIVE 05/12/2023 1200   LEUKOCYTESUR NEGATIVE 05/12/2023 1200   Sepsis Labs: Invalid input(s): "PROCALCITONIN", "LACTICIDVEN"  Microbiology: No results found for this or any previous visit (from the past 240 hours).   Radiology Studies: No results found.      Kalima Saylor T. Madex Seals [facility]  If 7PM-7AM, please contact night-coverage [email or web address] 05/27/2023, 2:03 PM

## 2023-05-27 NOTE — Care Management Important Message (Signed)
 Important Message  Patient Details  Name: Philip [name] MRN: [id number] Date of Birth: 28-Apr-1955   Important Message Given:  Yes - Medicare IM     Janith Melnick 05/27/2023, 11:19 AM

## 2023-05-27 NOTE — Progress Notes (Signed)
 Mobility Specialist Progress Note;   05/27/23 0910  Mobility  Activity Ambulated with assistance in room  Level of Assistance Standby assist, set-up cues, supervision of patient - no hands on  Assistive Device None  Distance Ambulated (ft) 15 ft  Activity Response Tolerated fair  Mobility Referral Yes  Mobility visit 1 Mobility  Mobility Specialist Start Time (ACUTE ONLY) 0910  Mobility Specialist Stop Time (ACUTE ONLY) [id number]  Mobility Specialist Time Calculation (min) (ACUTE ONLY) 11 min   Pt eager for mobility. On 5LO2 upon arrival. Required no physical assistance during ambulation, SV. Began ambulation on 8LO2, SPO2 desat to 80%. Increased pt up to 10LO2, able to maintain for short bit then desat again. Increased pt to 15LO2 to maintain SPO2 88%>. Dyspnea displayed throughout, however pt performed PLB while ambulating. Pt returned back to sitting on EOB, RT in room.   Janit Meline Mobility Specialist Please contact via SecureChat or [employer] [phone]

## 2023-05-27 NOTE — Plan of Care (Signed)
  Problem: Education: Goal: Knowledge of General Education information will improve Description: Including pain rating scale, medication(s)/side effects and non-pharmacologic comfort measures Outcome: Progressing   Problem: Clinical Measurements: Goal: Ability to maintain clinical measurements within normal limits will improve Outcome: Progressing Goal: Will remain free from infection Outcome: Progressing Goal: Diagnostic test results will improve Outcome: Progressing Goal: Cardiovascular complication will be avoided Outcome: Progressing   

## 2023-05-28 ENCOUNTER — Other Ambulatory Visit (HOSPITAL_COMMUNITY): Payer: Self-pay

## 2023-05-28 LAB — RENAL FUNCTION PANEL
Albumin: 2.4 g/dL — ABNORMAL LOW (ref 3.5–5.0)
Anion gap: 3 — ABNORMAL LOW (ref 5–15)
BUN: 20 mg/dL (ref 8–23)
CO2: 36 mmol/L — ABNORMAL HIGH (ref 22–32)
Calcium: 8 mg/dL — ABNORMAL LOW (ref 8.9–10.3)
Chloride: 99 mmol/L (ref 98–111)
Creatinine, Ser: 0.86 mg/dL (ref 0.61–1.24)
GFR, Estimated: >60 mL/min (ref >60)
Glucose, Bld: 148 mg/dL — ABNORMAL HIGH (ref 70–99)
Phosphorus: 3.3 mg/dL (ref 2.5–4.6)
Potassium: 4 mmol/L (ref 3.5–5.1)
Sodium: 138 mmol/L (ref 135–145)

## 2023-05-28 LAB — CBC
HCT: 33.6 % — ABNORMAL LOW (ref 39.0–52.0)
Hemoglobin: 10.2 g/dL — ABNORMAL LOW (ref 13.0–17.0)
MCH: 27.9 pg (ref 26.0–34.0)
MCHC: 30.4 g/dL (ref 30.0–36.0)
MCV: 91.8 fL (ref 80.0–100.0)
Platelets: 367 10*3/uL (ref 150–400)
RBC: 3.66 MIL/uL — ABNORMAL LOW (ref 4.22–5.81)
RDW: 14.7 % (ref 11.5–15.5)
WBC: 12.1 10*3/uL — ABNORMAL HIGH (ref 4.0–10.5)
nRBC: 0.2 % (ref 0.0–0.2)

## 2023-05-28 LAB — MAGNESIUM: Magnesium: 2.2 mg/dL (ref 1.7–2.4)

## 2023-05-28 MED ORDER — IPRATROPIUM-ALBUTEROL 0.5-2.5 (3) MG/3ML IN SOLN
3.0000 mL | RESPIRATORY_TRACT | 0 refills | Status: AC | PRN
Start: 2023-05-28 — End: ?
  Filled 2023-05-28: qty 360, 20d supply, fill #0

## 2023-05-28 MED ORDER — BUDESONIDE 0.5 MG/2ML IN SUSP
0.5000 mg | Freq: Two times a day (BID) | RESPIRATORY_TRACT | 12 refills | Status: AC
  Filled 2023-05-28: qty 120, 30d supply, fill #0

## 2023-05-28 MED ORDER — PANTOPRAZOLE SODIUM 40 MG PO TBEC
40.0000 mg | DELAYED_RELEASE_TABLET | Freq: Every day | ORAL | 0 refills | Status: DC
  Filled 2023-05-28: qty 90, 90d supply, fill #0

## 2023-05-28 MED ORDER — REVEFENACIN 175 MCG/3ML IN SOLN
175.0000 ug | Freq: Every day | RESPIRATORY_TRACT | 1 refills | Status: AC
  Filled 2023-05-28: qty 90, 30d supply, fill #0

## 2023-05-28 MED ORDER — TAMSULOSIN HCL 0.4 MG PO CAPS: 0.4000 mg | ORAL_CAPSULE | Freq: Every day | ORAL | Status: DC

## 2023-05-28 MED ORDER — PREDNISONE 10 MG PO TABS
ORAL_TABLET | ORAL | 0 refills | Status: DC
  Filled 2023-05-28: qty 125, 83d supply, fill #0

## 2023-05-28 MED ORDER — ARFORMOTEROL TARTRATE 15 MCG/2ML IN NEBU
15.0000 ug | INHALATION_SOLUTION | Freq: Two times a day (BID) | RESPIRATORY_TRACT | 1 refills | Status: AC
  Filled 2023-05-28: qty 120, 30d supply, fill #0

## 2023-05-28 MED ORDER — FUROSEMIDE 40 MG PO TABS
40.0000 mg | ORAL_TABLET | Freq: Every day | ORAL | 0 refills | Status: AC
  Filled 2023-05-28: qty 90, 90d supply, fill #0

## 2023-05-28 NOTE — Progress Notes (Signed)
 Physical Therapy Treatment Patient Details Name: Philip [name] MRN: [id number] DOB: Jun 12, 1955 Today's Date: 05/28/2023   History of Present Illness Pt is 68 year old transferred to acute care at [facility] from inpatient rehab on  05/19/23 for acute on chronic respiratory failure with increasing O2 requirements on rehab. Pt placed on HHFNC. PMH - recurrent lung CA, radiation induced IPF, chronic resp failure, PE, copd.    PT Comments  Pt is very eager to return home. He agreed to ambulate within the hallway with a chair follow. Pt pushed the O2 tank during gait and self-selected the distance he would go before taking a seated rest break. He ambulated a total of ~61ft with CGA. His longest bout was ~18ft. Pt continues to desaturate with functional mobility requiring seated rest breaks and cues for PLB technique. He ambulated on 10L fluctuating between 85-91% SpO2 with pleth reading unreliable at times. Will continue to follow acutely and advance appropriately.      If plan is discharge home, recommend the following: Assist for transportation;Help with stairs or ramp for entrance;Assistance with cooking/housework;A little help with walking and/or transfers   Can travel by private vehicle        Equipment Recommendations  Rollator (4 wheels);Other (comment) Armed forces technical officer)    Recommendations for Other Services       Precautions / Restrictions Precautions Precautions: Fall Recall of Precautions/Restrictions: Intact Precaution/Restrictions Comments: Monitor SpO2 Restrictions Weight Bearing Restrictions Per Provider Order: No     Mobility  Bed Mobility               General bed mobility comments: Not assessed. Pt greeted seated EOB.    Transfers Overall transfer level: Needs assistance Equipment used: None (O2 tank) Transfers: Sit to/from Stand, Bed to chair/wheelchair/BSC Sit to Stand: Supervision   Step pivot transfers: Supervision       General transfer comment: Pt  stood from lowest bed height by pushing up with BUE support. He transferred to recliner chair on his right. Good eccentric control with sitting. Pt stood from recliner chair pushing up with one hand on arm rest and other on handle of O2 tank.    Ambulation/Gait Ambulation/Gait assistance: Contact guard assist, +2 safety/equipment (Chair Follow) Gait Distance (Feet): 20 Feet (1x10, seated rest, 1x20, seated rest, 1x10) Assistive device: None (O2 tank) Gait Pattern/deviations: Step-through pattern, Decreased stride length, Trunk flexed Gait velocity: decreased Gait velocity interpretation: <1.31 ft/sec, indicative of household ambulator   General Gait Details: Brought pt out of room into hallway by pushing him in the recliner chair, so he would have a straight away to walk. Pt would self-select the distance he would ambulate prior to a seated rest based on SOB/DOE. He used the O2 tank for extra support and took slow even steps without LOB.   Stairs             Wheelchair Mobility     Tilt Bed    Modified Rankin (Stroke Patients Only)       Balance Overall balance assessment: Mild deficits observed, not formally tested                                          Communication Communication Communication: No apparent difficulties Factors Affecting Communication: Hearing impaired  Cognition Arousal: Alert Behavior During Therapy: WFL for tasks assessed/performed   PT - Cognitive impairments: No apparent impairments  Following commands: Intact      Cueing Cueing Techniques: Verbal cues  Exercises      General Comments General comments (skin integrity, edema, etc.): Pt greeted on 5L O2 via HFNC at rest. Increased pt to 8L prior to transfer to recliner chair and he desaturated to 82% SpO2. Pt was able to recover to 89% SpO2 on 8L with seated rest and cues for PLB technique. Increased pt to 10L prior to gait and pt  fluctuated between 85-91% SpO2. Pleth reading was unreliable at times and desaturation would be noted during his seated rest breaks. Increased to 15L as the O2 tank doesn't have 11-14L and pt did not desaturate with functional mobility remaining at 89% SpO2. Wife present and supportive during session.      Pertinent Vitals/Pain Pain Assessment Pain Assessment: No/denies pain    Home Living                          Prior Function            PT Goals (current goals can now be found in the care plan section) Acute Rehab PT Goals Patient Stated Goal: Return Home Progress towards PT goals: Progressing toward goals    Frequency    Min 2X/week      PT Plan      Co-evaluation              AM-PAC PT "6 Clicks" Mobility   Outcome Measure  Help needed turning from your back to your side while in a flat bed without using bedrails?: None Help needed moving from lying on your back to sitting on the side of a flat bed without using bedrails?: None Help needed moving to and from a bed to a chair (including a wheelchair)?: A Little Help needed standing up from a chair using your arms (e.g., wheelchair or bedside chair)?: A Little Help needed to walk in hospital room?: A Little Help needed climbing 3-5 steps with a railing? : Total 6 Click Score: 18    End of Session Equipment Utilized During Treatment: Gait belt;Oxygen Activity Tolerance: Patient tolerated treatment well Patient left: in chair;with call bell/phone within reach;with family/visitor present Nurse Communication: Mobility status;Other (comment) (Desaturation and high oxygen requirements) PT Visit Diagnosis: Difficulty in walking, not elsewhere classified (R26.2)     Time: 4098-1191 PT Time Calculation (min) (ACUTE ONLY): 15 min  Charges:    $Gait Training: 8-22 mins PT General Charges $$ ACUTE PT VISIT: 1 Visit                     Glenford Lanes, PT, DPT Acute Rehabilitation Services Office:  [phone] Secure Chat Preferred  Riva Chester 05/28/2023, 12:58 PM

## 2023-05-28 NOTE — TOC Transition Note (Addendum)
 Transition of Care [facility] - Discharge Note   Patient Details  Name: Philip [name] MRN: [id number] Date of Birth: 02/17/1955  Transition of [facility]) CM/SW Contact:  Cosimo Diones, RN Phone Number: 05/28/2023, 10:46 AM   Clinical Narrative:  Case Manager reviewed Medicare.gov choice with the family and the patient chose [facility]- agency will be able to accept the patient. Start of care to begin within 24-48 hours post transition home. DME transport chair ordered via [employer] and DME to be delivered to the room before home. Patient opted to get DME rollator from a medical supply store and Case Manager is unable to assist with electric scooter. PCP has to order. [facility] RT to discuss with patient regarding NIV and the office will arrange for delivery if the patient is agreeable. Patient states he is being followed by Oncology for pain management. Spouse at the bedside for transportation home.    05-28-23 1222 Jessica health care specialists with [employer] called the patient to explain the NIV- patient wants to proceed with the NIV to see if insurance will approve. Case Manager received order form via [employer] and will fax back for insurance approval. [facility] to follow up with the patient regarding insurance approval.   Final next level of care: Home w Home Health Services Barriers to Discharge: No Barriers Identified  Patient Goals and CMS Choice Patient states their goals for this hospitalization and ongoing recovery are:: Plan to return home once stable  Discharge Plan and Services Additional resources added to the After Visit Summary for     Discharge Planning Services: CM Consult Post Acute Care Choice: Home Health          DME Arranged: Lightweight manual wheelchair with seat cushion DME Agency: [employer] Date DME Agency Contacted: 05/28/23 Time DME Agency Contacted: 1046 Representative spoke with at DME Agency: Zula Hitch HH Arranged: RN, Disease  Management, PT HH Agency: [facility] Date HH Agency Contacted: 05/28/23 Time HH Agency Contacted: 1034 Representative spoke with at [facility] Agency: Renetta Carter  Social Drivers of Health (SDOH) Interventions SDOH Screenings   Food Insecurity: No Food Insecurity (05/19/2023)  Housing: Low Risk  (05/19/2023)  Transportation Needs: No Transportation Needs (05/19/2023)  Utilities: Not At Risk (05/19/2023)  Social Connections: Moderately Integrated (05/19/2023)  Tobacco Use: Medium Risk (05/19/2023)     Readmission Risk Interventions    05/24/2023    5:05 PM 01/22/2022   11:05 AM 01/14/2022   12:03 PM  Readmission Risk Prevention Plan  Transportation Screening Complete Complete Complete  PCP or Specialist Appt within 5-7 Days  Complete Complete  PCP or Specialist Appt within 3-5 Days Complete    Home Care Screening  Complete Complete  Medication Review (RN CM)  Complete Complete  HRI or Home Care Consult Complete    Social Work Consult for Recovery Care Planning/Counseling Complete    Palliative Care Screening Not Applicable    Medication Review Oceanographer) Referral to Pharmacy

## 2023-05-28 NOTE — Progress Notes (Signed)
 Occupational Therapy Treatment Patient Details Name: Philip [name] MRN: [id number] DOB: 03-10-1955 Today's Date: 05/28/2023   History of present illness Pt is 68 year old transferred to acute care at [facility] from inpatient rehab on  05/19/23 for acute on chronic respiratory failure with increasing O2 requirements on rehab. Pt placed on HHFNC. PMH - recurrent lung CA, radiation induced IPF, chronic resp failure, PE, copd.   OT comments  Pt. Seen for skilled OT treatment session with wife present.  Pt. Able to complete bed mobility with MOD I.  Initially required max cues and encouragement to limit conversation while trying to complete PLB to aide in o2 rebound after mobility.  Session focused on energy conservation and breathing strategies during ADLs/mobility.  5Ps handout provided for review.  Pt. And spouse receptive to education and were able to provide examples already implemented at home.  Eager for home when able.  Cont. With acute OT POC.        If plan is discharge home, recommend the following:  A little help with walking and/or transfers;A lot of help with bathing/dressing/bathroom;Assistance with cooking/housework;Direct supervision/assist for medications management;Assist for transportation;Help with stairs or ramp for entrance;Direct supervision/assist for financial management   Equipment Recommendations       Recommendations for Other Services      Precautions / Restrictions Precautions Recall of Precautions/Restrictions: Intact Precaution/Restrictions Comments: Monitor SpO2       Mobility Bed Mobility Overal bed mobility: Modified Independent Bed Mobility: Rolling, Sidelying to Sit           General bed mobility comments: pt. exits on L side. hob slightly elevated to mimic pillow placement at home.  wife reports she did buy a rail fo use if needed.  pt. able to demo good log roll.  required max cues for not talking and focus on breathing to aide in o2 rebound.  able  to sit un supported for duration of education without lob    Transfers                         Balance                                           ADL either performed or assessed with clinical judgement   ADL                                         General ADL Comments: provided 5Ps handout and reviewed at length. pt. and spouse receptive to education.  both able to provide examples of systems already in place to aide in energy conservation and breathing strategies.  aware of benefits of PLB and able to demo.  required cues throughout to manage amount and pace of talking as his o2 fluctuates with even min. conversation.  pt. hoovering around 88-91 throught with initial dip to mid/low 80s upon arrival after initial bed mobility    Extremity/Trunk Assessment              Vision       Perception     Praxis     Communication Communication Communication: No apparent difficulties Factors Affecting Communication: Hearing impaired   Cognition Arousal: Alert Behavior During Therapy: WFL for tasks assessed/performed Cognition: No apparent  impairments             OT - Cognition Comments: cooperative but frustrated about health                 Following commands: Intact        Cueing      Exercises Other Exercises Other Exercises: reviewed benefits of seated chair push ups. pt. able to complete 3 wtih partial lifting of buttocks off of the bed. required rest break after the 3 with notable implementing of PLB to aide in SOB.    Shoulder Instructions       General Comments  Loves working and wants to return to work.  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      Pertinent Vitals/ Pain       Pain Assessment Pain Assessment: No/denies pain  Home Living                                          Prior Functioning/Environment              Frequency  Min 2X/week         Progress Toward Goals  OT Goals(current goals can now be found in the care plan section)  Progress towards OT goals: Progressing toward goals     Plan      Co-evaluation                 AM-PAC OT "6 Clicks" Daily Activity     Outcome Measure   Help from another person eating meals?: None Help from another person taking care of personal grooming?: A Little Help from another person toileting, which includes using toliet, bedpan, or urinal?: A Little Help from another person bathing (including washing, rinsing, drying)?: A Little Help from another person to put on and taking off regular upper body clothing?: A Little Help from another person to put on and taking off regular lower body clothing?: A Little 6 Click Score: 19    End of Session Equipment Utilized During Treatment: Oxygen  OT Visit Diagnosis: Unsteadiness on feet (R26.81);Other abnormalities of gait and mobility (R26.89);Pain   Activity Tolerance Other (comment) (o2)   Patient Left with family/visitor present;with call bell/phone within reach;Other (comment) (seated eob)   Nurse Communication Other (comment) (rn states ok to work with pt. today)        Time: 8119-1478 OT Time Calculation (min): 32 min  Charges: OT General Charges $OT Visit: 1 Visit OT Treatments $Self Care/Home Management : 23-37 mins  Howell Macintosh, COTA/L Acute Rehabilitation [phone]   Leory Rands Lorraine-COTA/L  05/28/2023, 11:56 AM

## 2023-05-28 NOTE — Plan of Care (Signed)
   Problem: Education: Goal: Knowledge of General Education information will improve Description: Including pain rating scale, medication(s)/side effects and non-pharmacologic comfort measures Outcome: Progressing   Problem: Clinical Measurements: Goal: Ability to maintain clinical measurements within normal limits will improve Outcome: Progressing Goal: Will remain free from infection Outcome: Progressing

## 2023-05-28 NOTE — Progress Notes (Signed)
 Due to Philip [name] diagnosis of COPD, pulmonary fibrosis, CHF and acute on chronic respiratory failure with very high oxygen requirement, non-invasive ventilation is needed to assist with normalizing oxygen levels and to reduce the risk of repeat, acute episodes of respiratory failure resulting in longer inpatient hospital stays with more acute levels of care, such as ICU and mechanical ventilation. He has been hospitalized for Acute on Chronic respiratory failure. Patient has a diagnosis of right ventricular failure and cor-pulmonale. Patient also had a history of emphysema and radiation fibrosis. Interruption or failure to provide NIV would quickly lead to exacerbation of the patient's condition, lead to more hospitalizations and likely harm the patient or possibly death. Continued use of the NIV is preferred. Patient can maintain airway and clear secretions.

## 2023-05-28 NOTE — Discharge Summary (Signed)
 Physician Discharge Summary  CURLIE MACKEN OZH:[id number] DOB: 09/15/1955 DOA: 05/19/2023  PCP: [name], MD  Admit date: 05/19/2023 Discharge date: 05/28/23  Admitted From: CIR Disposition: Home Recommendations for Outpatient Follow-up:  Follow up with PCP in 1 to 2 weeks Pulmonology to arrange outpatient follow-up Check CMP and CBC in 1 to 2 weeks Please follow up on the following pending results: None  Home Health: [facility] PT/OT/RN Equipment/Devices: Rollator, transport chair and NIV.  Patient has home oxygen  Discharge Condition: Stable CODE STATUS: Full code  Follow-up Information     Health, [facility] Follow up.   Specialty: Home Health Services Why: Someone will call you to schedule resumption of care visit. Contact information: 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 [name] admitted from CIR due to acute on chronic respiratory failure with hypoxemia felt to be due to acute on chronic cor pulmonale.  Patient was hospitalized from 3/16-4/2 for acute on chronic respiratory failure in the setting of Pseudomonas pneumonia and COPD exacerbation and discharged to CIR for rehabilitation.  While in CIR, he had exertional desaturation and increased oxygen requirement, and PCCM consulted on 4/10 and started diuretics.  However, worsening hypoxemia despite aggressive diuresis with net negative of 16 L that prompted readmission.  On admission, he was requiring 12 L by HFNC.    Currently on Solu-Medrol , scheduled and as needed nebulizers.  Repeat CXR showed stable extensive chronic density throughout the lungs,  cardiomegaly and small right pleural effusion partially loculated laterally and increased since prior study.  Patient required up to 45 L 65% FiO2 but eventually improved.    CT chest on 4/21 with multiple areas of abnormality but fairly stable compared to prior CT last month.  Patient's oxygen requirement improved.  He maintained good saturation on 5 L at rest but desaturates with exertion.  However, he is able to recover with rest and increasing his oxygen.  He states that he has been dealing with this for a long time.  He says he has an oxygen tank at home that can go as high as 15 L if needed, and he is comfortable going home.  He has been cleared for discharge by pulmonology on prednisone  taper outpatient follow-up.  Per pulmonology, needs PJP prophylaxis if prednisone  taper is extended.  We have also added Lasix  40 mg daily for fluid and PPI for GI prophylaxis.  Will have to changed his Breztri  to budesonide , Brovana  and Yupelri .  He is on DuoNeb as needed.   HH PT/OT/RN/rollator and transport chair ordered.  TOC not able to assist with electric scooter and nurse to follow-up with PCP for this.  See individual problem list below for more.   Problems addressed during this hospitalization Acute on chronic respiratory failure with hypoxia due to acute on chronic cor pulmonale: TTE reported with normal RVSF on 3/26.  History of stage III lung cancer and radiation induced IPF, chronic COPD and chronic RF on 4 to 6 L at baseline.  Reportedly quit smoking about 17 months ago. CXR on 4/15 with stable small right pleural  effusion with chronic change throughout the lung.  BNP only 200.  Increased oxygen requirement despite aggressive diuresis in rehab.  Repeat CXR with small right pleural effusion, partially loculated laterally, increasing since prior study; stable extensive chronic densities throughout the lungs, and cardiomegaly.  Required up to 45 L with 65% FiO2.  Now on 5 L at rest.  -Pulmonology  recommended prednisone  taper-ordered. -Continue  Brovana , Pulmicort , Yupelri  and DuoNeb as needed -Continue p.o. Lasix  40 mg daily. -Patient has home oxygen.  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  Followed by Dr. [name]. -Outpatient follow-up.  Recommended stopping on initial consultation on 4/10   Anemia of chronic disease: H&H relatively stable -Recheck CBC in 1 week   Steroid-induced hyperglycemia: A1c 5.6%.  Hyperglycemia improved after decreasing steroids.  Blood glucose within acceptable range.   History of PE -Continue home Xarelto    Physical deconditioning - Home health and DME as above   BPH -Continue Flomax    Class I obesity Body mass index is 33.09 kg/m.               Time spent 35 minutes  Vital signs Vitals:   05/28/23 0716 05/28/23 0815 05/28/23 0818 05/28/23 0821  BP: 103/73     Pulse:  (!) 102    Temp:      Resp: 18 20    Height:      Weight:      SpO2: 93% 93% 93% 93%  TempSrc: Oral     BMI (Calculated):         Discharge exam  GENERAL: No apparent distress.  Nontoxic. HEENT: MMM.  Vision and hearing grossly intact.  NECK: Supple.  No apparent JVD.  RESP:  No IWOB.  Fair aeration bilaterally. CVS:  RRR. Heart sounds normal.  ABD/GI/GU: BS+. Abd soft, NTND.  MSK/EXT:  Moves extremities. No apparent deformity.  Trace BLE edema. SKIN: no apparent skin lesion or wound NEURO: Awake and alert. Oriented appropriately.  No apparent focal neuro deficit. PSYCH: Calm. Normal affect.   Discharge Instructions Discharge Instructions     Diet - low sodium heart healthy   Complete by: As directed    Discharge instructions   Complete by: As directed    It has been a pleasure taking care of you!  You were hospitalized due to shortness of breath and low oxygen likely  from underlying pulmonary fibrosis.  You have been treated with steroid, breathing treatments and fluid medications.  We are discharging you on prednisone  for inflammation and Lasix  for fluid.  We stopped your Breztri  and started you on new nebulizers.  Please review your new medication list and the directions on your medications before you take them.  Follow-up with your primary care doctor in 1 to 2 weeks or sooner if needed.  Follow-up with pulmonologist per their recommendation.   Take care,   Increase activity slowly   Complete by: As directed       Allergies as of 05/28/2023       Reactions   Carboplatin  Anaphylaxis, Shortness Of Breath, Cough, Hypertension   On dose 8. Became short of breath. Symptom management was called. Received benadryl , solumedrol, Pepcid  and fluids. Medication discontinued.    Klonopin [clonazepam] Other (See Comments)   nervous   Norco [hydrocodone -acetaminophen ] Other (See Comments)   Made pt feel jittery  Medication List     STOP taking these medications    Breztri  Aerosphere 160-9-4.8 MCG/ACT Aero inhaler Generic drug: budeson-glycopyrrolate -formoterol    ibuprofen  400 MG tablet Commonly known as: ADVIL    Ventolin  HFA 108 (90 Base) MCG/ACT inhaler Generic drug: albuterol        TAKE these medications    arformoterol  15 MCG/2ML Nebu Commonly known as: BROVANA  Take 2 mLs (15 mcg total) by nebulization 2 (two) times daily.   bisacodyl  10 MG suppository Commonly known as: DULCOLAX Place 1 suppository (10 mg total) rectally daily as needed for moderate constipation.   budesonide  0.5 MG/2ML nebulizer solution Commonly known as: PULMICORT  Take 2 mLs (0.5 mg total) by nebulization in the morning and at bedtime. What changed:  when to take this reasons to take this   furosemide  40 MG tablet Commonly known as: LASIX  Take 1 tablet (40 mg total) by mouth daily. Start taking on: May 29, 2023   guaiFENesin  600 MG 12 hr  tablet Commonly known as: MUCINEX  Take 1 tablet (600 mg total) by mouth 2 (two) times daily.   HYDROcodone -acetaminophen  5-325 MG tablet Commonly known as: NORCO/VICODIN Take 1 tablet by mouth every 12 (twelve) hours as needed for moderate pain (pain score 4-6).   ipratropium-albuterol  0.5-2.5 (3) MG/3ML Soln Commonly known as: DUONEB Take 3 mLs by nebulization every 4 (four) hours as needed (Shortness of breath, wheezing). What changed:  when to take this reasons to take this   magnesium  oxide 400 MG tablet Commonly known as: MAG-OX Take 1 tablet (400 mg total) by mouth daily.   Multi Adult Gummies Chew Chew 1 tablet by mouth daily.   pantoprazole  40 MG tablet Commonly known as: Protonix  Take 1 tablet (40 mg total) by mouth daily.   polyethylene glycol 17 g packet Commonly known as: MIRALAX  / GLYCOLAX  Take 17 g by mouth daily.   predniSONE  10 MG tablet Commonly known as: DELTASONE  Take 4 tablets (40 mg total) by mouth daily with breakfast for 7 days, THEN 3 tablets (30 mg total) daily with breakfast for 7 days, THEN 2 tablets (20 mg total) daily with breakfast for 7 days, THEN 1 tablet (10 mg total) daily with breakfast. Start taking on: May 29, 2023 What changed:  medication strength See the new instructions.   revefenacin  175 MCG/3ML nebulizer solution Commonly known as: YUPELRI  Take 3 mLs (175 mcg total) by nebulization daily. Start taking on: May 29, 2023   senna-docusate 8.6-50 MG tablet Commonly known as: Senokot-S Take 1 tablet by mouth at bedtime as needed for moderate constipation.   tamsulosin  0.4 MG Caps capsule Commonly known as: FLOMAX  Take 1 capsule (0.4 mg total) by mouth at bedtime.   Visine 0.025-0.3 % ophthalmic solution Generic drug: naphazoline-pheniramine Place 1 drop into both eyes daily as needed for eye irritation.   Xarelto  20 MG Tabs tablet Generic drug: rivaroxaban  Take 1 tablet (20 mg total) by mouth daily with supper.                Durable Medical Equipment  (From admission, onward)           Start     Ordered   05/26/23 1330  For home use only DME 4 wheeled rolling walker with seat  Once       Question:  Patient needs a walker to treat with the following condition  Answer:  Generalized weakness   05/26/23 1329   05/26/23 1330  For home use only DME Other see comment  Once       Comments: Transport chair  Question:  Length of Need  Answer:  Lifetime   05/26/23 1329   05/26/23 1329  For home use only DME Other see comment  Once       Comments: Electric scooter  Question:  Length of Need  Answer:  Lifetime   05/26/23 1329            Consultations: Pulmonology  Procedures/Studies:   CT CHEST WO CONTRAST Result Date: 05/25/2023 CLINICAL DATA:  Respiratory illness, nondiagnostic xray, hypoxia EXAM: CT CHEST WITHOUT CONTRAST TECHNIQUE: Multidetector CT imaging of the chest was performed following the standard protocol without IV contrast. RADIATION DOSE REDUCTION: This exam was performed according to the departmental dose-optimization program which includes automated exposure control, adjustment of the mA and/or kV according to patient size and/or use of iterative reconstruction technique. COMPARISON:  04/18/2023 FINDINGS: Cardiovascular: Unenhanced imaging of the heart is unremarkable without pericardial effusion. Normal caliber of the thoracic aorta. Atherosclerosis of the aorta and coronary vasculature. Assessment of the vascular lumen cannot be performed without intravenous contrast. Right-sided PICC, tip within the superior vena cava. Mediastinum/Nodes: No enlarged mediastinal or axillary lymph nodes. Thyroid  gland, trachea, and esophagus demonstrate no significant findings. Lungs/Pleura: Upper lobe predominant emphysema. Chronic bilateral perihilar scarring and fibrosis, right greater than left. Stable small right pleural effusion and right pleural thickening. No pneumothorax. Stable  bronchiectasis. The central airways are patent. Upper Abdomen: No acute abnormality. Musculoskeletal: No acute or destructive bony abnormalities. Reconstructed images demonstrate no additional findings. IMPRESSION: 1. Stable small right pleural effusion and right pleural thickening. 2. Stable bilateral perihilar scarring and fibrosis, likely post therapeutic. 3. No acute airspace disease. 4. Aortic Atherosclerosis (ICD10-I70.0) and Emphysema (ICD10-J43.9). Electronically Signed   By: Bobbye Burrow M.D.   On: 05/25/2023 08:54   DG CHEST PORT 1 VIEW Result Date: 05/24/2023 CLINICAL DATA:  Hypoxia EXAM: PORTABLE CHEST 1 VIEW COMPARISON:  05/20/2023, CT chest 04/18/2023, 01/19/2022, 03/25/2022 FINDINGS: Right upper extremity central venous catheter tip at the cavoatrial region. Emphysema.Similar perihilar bandlike densities and distortion. Similar circumferential right pleural effusion stable. Stable cardiomediastinal silhouette. No pneumothorax IMPRESSION: Similar perihilar distortion fibrosis and right pleural effusion compared to most recent prior Electronically Signed   By: Esmeralda Hedge M.D.   On: 05/24/2023 17:32   DG CHEST PORT 1 VIEW Result Date: 05/20/2023 CLINICAL DATA:  Hypoxia EXAM: PORTABLE CHEST 1 VIEW COMPARISON:  05/18/2023 FINDINGS: Cardiomegaly. Mediastinal contours within normal limits. Chronic densities throughout the lungs. Layering right pleural effusion, partially loculated laterally, slightly increased since prior study. IMPRESSION: Small right pleural effusion, partially loculated laterally, increasing since prior study. Stable extensive chronic densities throughout the lungs. Cardiomegaly. Electronically Signed   By: Janeece Mechanic M.D.   On: 05/20/2023 17:05   US  EKG SITE RITE Result Date: 05/20/2023 If Site Rite image not attached, placement could not be confirmed due to current cardiac rhythm.  DG CHEST PORT 1 VIEW Result Date: 05/18/2023 CLINICAL DATA:  Hypoxia EXAM: PORTABLE  CHEST 1 VIEW COMPARISON:  05/12/2023 FINDINGS: Postoperative changes and volume loss on the right. Elevation of the right hemidiaphragm. Small right pleural effusion. Chronic perihilar and right lower lobe opacities, unchanged. Heart and mediastinal contours are within normal limits. No acute bony abnormality. IMPRESSION: Stable small right pleural effusion with chronic changes throughout the lungs. No significant change. Electronically Signed   By: Janeece Mechanic M.D.   On: 05/18/2023 17:10   DG Chest Port 1 View Result Date: 05/12/2023  CLINICAL DATA:  Acute respiratory distress EXAM: PORTABLE CHEST 1 VIEW COMPARISON:  04/18/2023 FINDINGS: Elevation of the right hemidiaphragm. Heart and mediastinal contours are stable. Areas of linear scarring/fibrosis throughout the lungs. Small right pleural effusion is stable. No acute bony abnormality. IMPRESSION: Small right pleural effusion. Chronic changes throughout the lungs. No significant change since prior study. Electronically Signed   By: Janeece Mechanic M.D.   On: 05/12/2023 10:31   ECHOCARDIOGRAM COMPLETE Result Date: 04/28/2023    ECHOCARDIOGRAM REPORT   Patient Name:   Philip [name] Date of Exam: 04/28/2023 Medical Rec #:  [id number]        Height:       67.0 in Accession #:    [id number]       Weight:       200.0 lb Date of Birth:  14-Feb-1955        BSA:          2.022 m Patient Age:    67 years         BP:           106/83 mmHg Patient Gender: M                HR:           105 bpm. Exam Location:  Inpatient Procedure: 2D Echo, Cardiac Doppler and Color Doppler (Both Spectral and Color            Flow Doppler were utilized during procedure). Indications:    Pulmonary Hypertension  History:        Patient has prior history of Echocardiogram examinations, most                 recent 06/17/2019. COPD; Risk Factors:Dyslipidemia and Former                 Smoker.  Sonographer:    Reta Cassis Referring Phys: [id number] CHRISTOPHER P DANFORD IMPRESSIONS  1. Left  ventricular ejection fraction, by estimation, is 60 to 65%. The left ventricle has normal function. The left ventricle has no regional wall motion abnormalities. Left ventricular diastolic parameters are consistent with Grade I diastolic dysfunction (impaired relaxation).  2. Right ventricular systolic function is normal. The right ventricular size is normal.  3. Left atrial size was mildly dilated.  4. The mitral valve is normal in structure. No evidence of mitral valve regurgitation. No evidence of mitral stenosis.  5. The aortic valve is normal in structure. Aortic valve regurgitation is not visualized. No aortic stenosis is present.  6. The inferior vena cava is normal in size with greater than 50% respiratory variability, suggesting right atrial pressure of 3 mmHg. Comparison(s): Prior images reviewed side by side. FINDINGS  Left Ventricle: Left ventricular ejection fraction, by estimation, is 60 to 65%. The left ventricle has normal function. The left ventricle has no regional wall motion abnormalities. The left ventricular internal cavity size was normal in size. There is  no left ventricular hypertrophy. Left ventricular diastolic parameters are consistent with Grade I diastolic dysfunction (impaired relaxation). Right Ventricle: The right ventricular size is normal. No increase in right ventricular wall thickness. Right ventricular systolic function is normal. Left Atrium: Left atrial size was mildly dilated. Right Atrium: Right atrial size was normal in size. Pericardium: There is no evidence of pericardial effusion. Mitral Valve: The mitral valve is normal in structure. No evidence of mitral valve regurgitation. No evidence of mitral valve stenosis. Tricuspid Valve: The tricuspid valve is normal in  structure. Tricuspid valve regurgitation is not demonstrated. No evidence of tricuspid stenosis. Aortic Valve: The aortic valve is normal in structure. Aortic valve regurgitation is not visualized. No aortic  stenosis is present. Aortic valve mean gradient measures 3.0 mmHg. Aortic valve peak gradient measures 4.9 mmHg. Aortic valve area, by VTI measures 3.31 cm. Pulmonic Valve: The pulmonic valve was normal in structure. Pulmonic valve regurgitation is not visualized. No evidence of pulmonic stenosis. Aorta: The aortic root is normal in size and structure. Venous: The inferior vena cava is normal in size with greater than 50% respiratory variability, suggesting right atrial pressure of 3 mmHg. IAS/Shunts: No atrial level shunt detected by color flow Doppler.  LEFT VENTRICLE PLAX 2D LVIDd:         4.40 cm   Diastology LVIDs:         3.00 cm   LV e' medial:    6.85 cm/s LV PW:         1.00 cm   LV E/e' medial:  10.0 LV IVS:        0.90 cm   LV e' lateral:   7.62 cm/s LVOT diam:     2.20 cm   LV E/e' lateral: 9.0 LV SV:         51 LV SV Index:   25 LVOT Area:     3.80 cm  RIGHT VENTRICLE RV Basal diam:  3.90 cm LEFT ATRIUM           Index        RIGHT ATRIUM           Index LA diam:      4.30 cm 2.13 cm/m   RA Area:     11.80 cm LA Vol (A2C): 69.0 ml 34.12 ml/m  RA Volume:   22.20 ml  10.98 ml/m LA Vol (A4C): 51.8 ml 25.62 ml/m  AORTIC VALVE AV Area (Vmax):    3.14 cm AV Area (Vmean):   3.31 cm AV Area (VTI):     3.31 cm AV Vmax:           111.00 cm/s AV Vmean:          74.700 cm/s AV VTI:            0.155 m AV Peak Grad:      4.9 mmHg AV Mean Grad:      3.0 mmHg LVOT Vmax:         91.70 cm/s LVOT Vmean:        65.100 cm/s LVOT VTI:          0.135 m LVOT/AV VTI ratio: 0.87  AORTA Ao Root diam: 3.90 cm Ao Asc diam:  2.90 cm MITRAL VALVE MV Area (PHT): 5.02 cm    SHUNTS MV Decel Time: 151 msec    Systemic VTI:  0.14 m MV E velocity: 68.60 cm/s  Systemic Diam: 2.20 cm MV A velocity: 92.50 cm/s MV E/A ratio:  0.74 Dorothye Gathers MD Electronically signed by Dorothye Gathers MD Signature Date/Time: 04/28/2023/3:38:51 PM    Final        The results of significant diagnostics from this hospitalization (including imaging,  microbiology, ancillary and laboratory) are listed below for reference.     Microbiology: No results found for this or any previous visit (from the past 240 hours).   Labs:  CBC: Recent Labs  Lab 05/24/23 0240 05/25/23 0357 05/28/23 0540  WBC 11.9* 10.0 12.1*  HGB 10.0* 9.8* 10.2*  HCT 32.2* 32.0*  33.6*  MCV 92.8 91.7 91.8  PLT 364 352 367   BMP &GFR Recent Labs  Lab 05/23/23 0146 05/24/23 0240 05/25/23 0357 05/27/23 0835 05/28/23 0540  NA 139 141 137 138 138  K 4.0 4.6 4.4 3.6 4.0  CL 99 97* 100 96* 99  CO2 33* 34* 32 33* 36*  GLUCOSE 196* 209* 109* 186* 148*  BUN 23 25* 26* 21 20  CREATININE 0.95 0.95 0.90 1.05 0.86  CALCIUM  8.9 8.8* 8.1* 8.2* 8.0*  MG  --  2.3 2.4 2.2 2.2  PHOS  --  3.0 3.0 3.3 3.3   Estimated Creatinine Clearance: 89 mL/min (by C-G formula based on SCr of 0.86 mg/dL). Liver & Pancreas: Recent Labs  Lab 05/24/23 0240 05/25/23 0357 05/27/23 0835 05/28/23 0540  ALBUMIN  2.6* 2.5* 2.8* 2.4*   No results for input(s): "LIPASE", "AMYLASE" in the last 168 hours. No results for input(s): "AMMONIA" in the last 168 hours. Diabetic: No results for input(s): "HGBA1C" in the last 72 hours. No results for input(s): "GLUCAP" in the last 168 hours. Cardiac Enzymes: No results for input(s): "CKTOTAL", "CKMB", "CKMBINDEX", "TROPONINI" in the last 168 hours. No results for input(s): "PROBNP" in the last 8760 hours. Coagulation Profile: No results for input(s): "INR", "PROTIME" in the last 168 hours. Thyroid  Function Tests: No results for input(s): "TSH", "T4TOTAL", "FREET4", "T3FREE", "THYROIDAB" in the last 72 hours. Lipid Profile: No results for input(s): "CHOL", "HDL", "LDLCALC", "TRIG", "CHOLHDL", "LDLDIRECT" in the last 72 hours. Anemia Panel: No results for input(s): "VITAMINB12", "FOLATE", "FERRITIN", "TIBC", "IRON", "RETICCTPCT" in the last 72 hours. Urine analysis:    Component Value Date/Time   COLORURINE AMBER (A) 05/12/2023 1200    APPEARANCEUR CLOUDY (A) 05/12/2023 1200   LABSPEC 1.010 05/12/2023 1200   PHURINE 6.0 05/12/2023 1200   GLUCOSEU NEGATIVE 05/12/2023 1200   HGBUR MODERATE (A) 05/12/2023 1200   BILIRUBINUR NEGATIVE 05/12/2023 1200   KETONESUR NEGATIVE 05/12/2023 1200   PROTEINUR 100 (A) 05/12/2023 1200   NITRITE NEGATIVE 05/12/2023 1200   LEUKOCYTESUR NEGATIVE 05/12/2023 1200   Sepsis Labs: Invalid input(s): "PROCALCITONIN", "LACTICIDVEN"   SIGNED:  Theadore Finger, MD  [facility] 05/28/2023, 10:10 AM

## 2023-05-28 NOTE — Progress Notes (Signed)
 PICC line removed - per MD order for discharge. Return precautions reviewed, pressure dressing applied and pressure held until bleeding stopped. Instructed to remain in bed until 10am. No additional questions or concerns. Instructed to leave dressing in place for 48 hours. Additional dressing left in room in case site continues to ooze.   Lataunya Ruud R Vester Titsworth, RN

## 2023-05-28 NOTE — Progress Notes (Addendum)
 SATURATION QUALIFICATIONS: (This note is used to comply with regulatory documentation for home oxygen)  Patient Saturations on Room Air at Rest = NT   Patient Saturations on Room Air while Ambulating = NT  Patient Saturations on 15 Liters of oxygen while Ambulating = 89%  Please briefly explain why patient needs home oxygen: Pt greeted on 5L O2 via HFNC at rest. Did not remove pt to RA as he is known to desaturate while on supplemental oxygen both at rest and with activity. Increased pt to 8L prior to transfer to recliner chair and he desaturated to 82% SpO2. Increased pt to 10L prior to gait and pt's SpO2 fluctuated between 85-91% SpO2. Pleth reading was unreliable at times and desaturation would be noted during his seated rest. Increased to 15L as the O2 tank doesn't have 11-14L and pt did not desaturate with functional mobility. He currently requires supplemental oxygen to complete all tasks.   Glenford Lanes, PT, DPT Acute Rehabilitation Services Office: [phone] Secure Chat Preferred

## 2023-06-01 ENCOUNTER — Inpatient Hospital Stay: Admitting: Internal Medicine

## 2023-06-01 ENCOUNTER — Other Ambulatory Visit

## 2023-06-09 ENCOUNTER — Encounter: Payer: Self-pay | Admitting: Nurse Practitioner

## 2023-06-09 ENCOUNTER — Inpatient Hospital Stay (HOSPITAL_BASED_OUTPATIENT_CLINIC_OR_DEPARTMENT_OTHER): Admitting: Nurse Practitioner

## 2023-06-09 DIAGNOSIS — Z87891 Personal history of nicotine dependence: Secondary | ICD-10-CM | POA: Insufficient documentation

## 2023-06-09 DIAGNOSIS — R0602 Shortness of breath: Secondary | ICD-10-CM | POA: Insufficient documentation

## 2023-06-09 DIAGNOSIS — Z515 Encounter for palliative care: Secondary | ICD-10-CM

## 2023-06-09 DIAGNOSIS — G893 Neoplasm related pain (acute) (chronic): Secondary | ICD-10-CM | POA: Diagnosis not present

## 2023-06-09 DIAGNOSIS — C3411 Malignant neoplasm of upper lobe, right bronchus or lung: Secondary | ICD-10-CM | POA: Insufficient documentation

## 2023-06-09 DIAGNOSIS — R530 Neoplastic (malignant) related fatigue: Secondary | ICD-10-CM

## 2023-06-09 NOTE — Progress Notes (Signed)
 Palliative Medicine [facility]  [phone] [phone]   Name: Philip [name] Date: 06/09/2023 MRN: [id number]  DOB: 1955/05/05  Patient Care Team: [name], MD as PCP - General (Family Medicine) Jaye Mettle, RN as Oncology Nurse Navigator Lemuel Quaker, RN as Case Manager (General Practice) Pickenpack-Cousar, Giles Labrum, NP as Nurse Practitioner (Nurse Practitioner)   I connected with Philip [name] on 06/09/23 at  3:00 PM EDT by telephone and verified that I am speaking with the correct person using two identifiers.   I discussed the limitations, risks, security and privacy concerns of performing an evaluation and management service by telemedicine and the availability of in-person appointments. I also discussed with the patient that there may be a patient responsible charge related to this service. The patient expressed understanding and agreed to proceed.   Other persons participating in the visit and their role in the encounter: N/A   Patient's location: Home  Provider's location: [facility]   INTERVAL HISTORY: Philip [name] is a 68 y.o. male with oncologic medical history including recurrent non-small cell lung cancer (01/2019 then again 12/2020). Past medical history also includes COPD, BPH, tobacco use, PE, and GERD. Palliative ask to see for symptom management and goals of care.   SOCIAL HISTORY:     reports that he quit smoking about 18 months ago. His smoking use included cigarettes. He started smoking about 52 years ago. He has a 102.8 pack-year smoking history. He has quit using smokeless tobacco.  His smokeless tobacco use included chew. He reports current alcohol use. He reports that he does not currently use drugs.  ADVANCE DIRECTIVES:  None on file   CODE STATUS: Full code  PAST MEDICAL HISTORY: Past Medical History:  Diagnosis Date   Chronic kidney disease    COPD (chronic obstructive pulmonary disease) (HCC)     Dyspnea    GERD (gastroesophageal reflux disease)    Headache    migraines   Hyperlipidemia    lung ca dx'd 12/2018   On home oxygen therapy    3 to 10 L   Pneumonia    Pulmonary embolus (HCC)    Tobacco abuse     ALLERGIES:  is allergic to carboplatin , klonopin [clonazepam], and norco [hydrocodone -acetaminophen ].  MEDICATIONS:  Current Outpatient Medications  Medication Sig Dispense Refill   arformoterol  (BROVANA ) 15 MCG/2ML NEBU Take 2 mLs (15 mcg total) by nebulization 2 (two) times daily. 120 mL 1   bisacodyl  (DULCOLAX) 10 MG suppository Place 1 suppository (10 mg total) rectally daily as needed for moderate constipation.     budesonide  (PULMICORT ) 0.5 MG/2ML nebulizer solution Take 2 mLs (0.5 mg total) by nebulization in the morning and at bedtime. 120 mL 12   furosemide  (LASIX ) 40 MG tablet Take 1 tablet (40 mg total) by mouth daily. 90 tablet 0   guaiFENesin  (MUCINEX ) 600 MG 12 hr tablet Take 1 tablet (600 mg total) by mouth 2 (two) times daily. 60 tablet 0   HYDROcodone -acetaminophen  (NORCO/VICODIN) 5-325 MG tablet Take 1 tablet by mouth every 12 (twelve) hours as needed for moderate pain (pain score 4-6). 30 tablet 0   ipratropium-albuterol  (DUONEB) 0.5-2.5 (3) MG/3ML SOLN Take 3 mLs by nebulization every 4 (four) hours as needed (Shortness of breath, wheezing). 360 mL 0   magnesium  oxide (MAG-OX) 400 MG tablet Take 1 tablet (400 mg total) by mouth daily. 30 tablet 0   Multiple Vitamins-Minerals (MULTI ADULT GUMMIES) CHEW Chew 1 tablet by  mouth daily.     naphazoline-pheniramine (VISINE) 0.025-0.3 % ophthalmic solution Place 1 drop into both eyes daily as needed for eye irritation.     pantoprazole  (PROTONIX ) 40 MG tablet Take 1 tablet (40 mg total) by mouth daily. 90 tablet 0   polyethylene glycol (MIRALAX  / GLYCOLAX ) 17 g packet Take 17 g by mouth daily.     predniSONE  (DELTASONE ) 10 MG tablet Take 4 tablets (40 mg total) by mouth daily with breakfast for 7 days, THEN 3  tablets (30 mg total) daily with breakfast for 7 days, THEN 2 tablets (20 mg total) daily with breakfast for 7 days, THEN 1 tablet (10 mg total) daily with breakfast. 125 tablet 0   revefenacin  (YUPELRI ) 175 MCG/3ML nebulizer solution Take 3 mLs (175 mcg total) by nebulization daily. 90 mL 1   rivaroxaban  (XARELTO ) 20 MG TABS tablet Take 1 tablet (20 mg total) by mouth daily with supper. 30 tablet 0   senna-docusate (SENOKOT-S) 8.6-50 MG tablet Take 1 tablet by mouth at bedtime as needed for moderate constipation.     tamsulosin  (FLOMAX ) 0.4 MG CAPS capsule Take 1 capsule (0.4 mg total) by mouth at bedtime. 30 capsule 0   No current facility-administered medications for this visit.    VITAL SIGNS: There were no vitals taken for this visit. There were no vitals filed for this visit.  Estimated body mass index is 33.09 kg/m as calculated from the following:   Height as of 05/20/23: 5\' 6"  (1.676 m).   Weight as of 05/27/23: 205 lb 0.4 oz (93 kg).   PERFORMANCE STATUS (ECOG) : 1 - Symptomatic but completely ambulatory  IMPRESSION: Discussed the use of AI scribe software for clinical note transcription with the patient, who gave verbal consent to proceed.  History of Present Illness Philip [name] "Philip [name]" is a 68 year old male with chronic lung issues and kidney pain who I connected with by phone for follow-up. Patient was recently hospitalized for over 6 weeks due to respiratory failure with hypoxia and COPD exacerbation.  States he is feeling much better since returning home. He is managing his lung condition with steroids and Lasix  to balance fluid retention and lung function. He notes weight gain attributed to steroid use and is in the process of tapering down his steroid dosage from four to three pills a day, with plans to reduce further.  Patient expresses frustration due to not making contact with home health as discussed at discharge. He was discharged with plans for home health services,  including physical therapy, occupational therapy, and nursing care via [facility], but has not been contacted by the service providers. He has a an IV machine and a transport chair but is awaiting a rollator with a seat. He has difficulty in receiving follow-up care and communication from the home health service.  Discussed we will try to make attempts to contact agency on his behalf.  Mr. Lahaie describes ongoing kidney pain, which he manages with pain medication taken as needed, but does not exceed two to three doses per day. He needs to follow up with his kidney specialists to address the underlying cause of the pain.  Pain is well-managed with hydrocodone .  No adjustments to regimen at this time.  All questions answered and support provided.  I discussed the importance of continued conversation with family and their medical providers regarding overall plan of care and treatment options, ensuring decisions are within the context of the patients values and GOCs. Assessment &  Plan Pulmonary issues with steroid use Chronic pulmonary issues require steroids for lung function. Recent hospitalization showed no new growths on scans. Treatment involves balancing steroids and Lasix . Weaning steroids from four to three pills daily, then to one. Recent infectious sputum treated with antibiotics. Frustration with healthcare coordination noted. - Reduce steroids to three pills daily for one week, then to one pill daily per discharge instructions. - Continue Lasix  for fluid retention per discharge instructions. - Follow up with pulmonologist. - Ensure NIV usage as prescribed.  Pneumonia Recent hospitalization treated pneumonia with antibiotics. No current symptoms. - Monitor for respiratory infection signs. - Follow up with primary care or pulmonologist.  Weight gain due to steroid use Weight gain from steroids necessary for lung function. Concern about weight gain acknowledged. - Monitor weight and adjust  diet. - Continue balancing steroids with Lasix .  Kidney pain Intermittent kidney pain managed with medication. Occurs slightly sooner than every twelve hours. - Continue current pain regimen with hydrocodone  as needed. - Follow up with nephrology.  Goals of Care Frustration with healthcare system. Rejects hospice care, desires autonomy and proactive management. - Respect wish to avoid hospice care at this time.  Patient is realistic in his understanding of lung disease however is clear and expressed wishes to continue to treat the treatable allow him every opportunity to continue to thrive. - Support goal to return home and maintain independence.  I will plan to follow-up with patient in 6-8 weeks.  Sooner if needed.  Patient expressed understanding and was in agreement with this plan. He also understands that He can call the clinic at any time with any questions, concerns, or complaints.   Any controlled substances utilized were prescribed in the context of palliative care. PDMP has been reviewed.   Visit consisted of counseling and education dealing with the complex and emotionally intense issues of symptom management and palliative care in the setting of serious and potentially life-threatening illness.  Dellia Ferguson, AGPCNP-BC  Palliative Medicine Team/[facility]

## 2023-06-10 ENCOUNTER — Other Ambulatory Visit: Payer: Self-pay

## 2023-06-10 DIAGNOSIS — Z515 Encounter for palliative care: Secondary | ICD-10-CM

## 2023-06-10 DIAGNOSIS — C3490 Malignant neoplasm of unspecified part of unspecified bronchus or lung: Secondary | ICD-10-CM

## 2023-06-10 DIAGNOSIS — M792 Neuralgia and neuritis, unspecified: Secondary | ICD-10-CM

## 2023-06-10 DIAGNOSIS — R530 Neoplastic (malignant) related fatigue: Secondary | ICD-10-CM

## 2023-06-10 NOTE — Progress Notes (Signed)
 New HH orders faxed to [facility]

## 2023-06-11 LAB — ACID FAST CULTURE WITH REFLEXED SENSITIVITIES (MYCOBACTERIA): Acid Fast Culture: NEGATIVE

## 2023-06-16 ENCOUNTER — Ambulatory Visit (INDEPENDENT_AMBULATORY_CARE_PROVIDER_SITE_OTHER): Admitting: Pulmonary Disease

## 2023-06-16 ENCOUNTER — Encounter: Payer: Self-pay | Admitting: Pulmonary Disease

## 2023-06-16 VITALS — BP 138/91 | HR 128 | Ht 66.0 in | Wt 198.4 lb

## 2023-06-16 DIAGNOSIS — Z9981 Dependence on supplemental oxygen: Secondary | ICD-10-CM | POA: Diagnosis not present

## 2023-06-16 DIAGNOSIS — C3490 Malignant neoplasm of unspecified part of unspecified bronchus or lung: Secondary | ICD-10-CM | POA: Diagnosis not present

## 2023-06-16 DIAGNOSIS — J449 Chronic obstructive pulmonary disease, unspecified: Secondary | ICD-10-CM | POA: Diagnosis not present

## 2023-06-16 DIAGNOSIS — J9621 Acute and chronic respiratory failure with hypoxia: Secondary | ICD-10-CM | POA: Diagnosis not present

## 2023-06-16 DIAGNOSIS — Z87891 Personal history of nicotine dependence: Secondary | ICD-10-CM

## 2023-06-16 NOTE — Progress Notes (Signed)
 Synopsis: Referred in January 2020 for pulmonary MAC by [name], MD.  Previously seen by Dr. Bradly Cage.  Previously seen by Dr. Thelda Finney.  Subjective:   PATIENT ID: Philip [name] GENDER: male DOB: [birth year], MRN: [id number]  Chief Complaint  Patient presents with   Follow-up    Patient states breathing is getting better since last visit. Cpap mask is leaking.    Philip [name] is a 68 y.o.gentleman with a history of stage III lung adenocarcinoma, COPD with chronic hypoxic respiratory failure using 5 L at rest, more with exertion, whom we are seeing in hospital follow-up.  Discharge summary reviewed x 2.  Multiple pulmonary consultation notes reviewed.  Unfortunately was hospitalized for prolonged period of time in the interim.  Treated for pneumonia.  High oxygen requirement.  Sent to CIR for rehab as he could get better.  In rehab things got worse.  Readmitted.  Diuresed aggressively.  Eventually get back down to baseline.  Regimen was changed from Breztri  to triple inhaled therapy via nebulizers.  Taking his Lasix  as prescribed.       Past Medical History:  Diagnosis Date   Chronic kidney disease    COPD (chronic obstructive pulmonary disease) (HCC)    Dyspnea    GERD (gastroesophageal reflux disease)    Headache    migraines   Hyperlipidemia    lung ca dx'd 12/2018   On home oxygen therapy    3 to 10 L   Pneumonia    Pulmonary embolus (HCC)    Tobacco abuse      Family History  Problem Relation Age of Onset   Cancer Father    Stomach cancer Father      Past Surgical History:  Procedure Laterality Date   COLONOSCOPY     CYSTOSCOPY W/ URETERAL STENT PLACEMENT Right 10/24/2021   Procedure: CYSTOSCOPY WITH RETROGRADE PYELOGRAM/URETERAL STENT PLACEMENT;  Surgeon: Lahoma Pigg, MD;  Location: [facility];  Service: Urology;  Laterality: Right;   CYSTOSCOPY WITH RETROGRADE PYELOGRAM, URETEROSCOPY AND STENT PLACEMENT Right 10/12/2022   Procedure: CYSTOSCOPY WITH RIGHT   RETROGRADE PYELOGRAM, RIGHT STENT EXCHANGE;  Surgeon: Lahoma Pigg, MD;  Location: [facility];  Service: Urology;  Laterality: Right;   HAND SURGERY     VASECTOMY     VIDEO BRONCHOSCOPY WITH ENDOBRONCHIAL ULTRASOUND N/A 12/22/2018   Procedure: VIDEO BRONCHOSCOPY WITH ENDOBRONCHIAL ULTRASOUND;  Surgeon: Prudy Brownie, DO;  Location: [facility];  Service: Thoracic;  Laterality: N/A;   VIDEO BRONCHOSCOPY WITH ENDOBRONCHIAL ULTRASOUND N/A 01/09/2019   Procedure: VIDEO BRONCHOSCOPY WITH ENDOBRONCHIAL ULTRASOUND WITH FLUORO;  Surgeon: Prudy Brownie, DO;  Location: [facility];  Service: Thoracic;  Laterality: N/A;   VIDEO BRONCHOSCOPY WITH RADIAL ENDOBRONCHIAL ULTRASOUND N/A 12/22/2018   Procedure: RADIAL ENDOBRONCHIAL ULTRASOUND;  Surgeon: Prudy Brownie, DO;  Location: [facility];  Service: Thoracic;  Laterality: N/A;    Social History   Socioeconomic History   Marital status: Married    Spouse name: Not on file   Number of children: Not on file   Years of education: Not on file   Highest education level: Not on file  Occupational History   Not on file  Tobacco Use   Smoking status: Former    Current packs/day: 0.00    Average packs/day: 2.0 packs/day for 51.4 years (102.8 ttl pk-yrs)    Types: Cigarettes    Start date: 07/04/1970    Quit date: 12/2021    Years since quitting: 1.5   Smokeless tobacco:  Former    Types: Chew   Tobacco comments:    Smoking 2 packs of cigarettes a week. 10/14/2021 [place]    Smoking is on and off, will go a week without smoking then start up and smoke half a pack a day  Vaping Use   Vaping status: Never Used  Substance and Sexual Activity   Alcohol use: Yes    Comment: hasn't drank in one month   Drug use: Not Currently   Sexual activity: Not on file  Other Topics Concern   Not on file  Social History Narrative   Not on file   Social Drivers of Health   Financial Resource Strain: Not on file  Food Insecurity: No Food Insecurity (05/19/2023)   Hunger Vital  Sign    Worried About Running Out of Food in the Last Year: Never true    Ran Out of Food in the Last Year: Never true  Transportation Needs: No Transportation Needs (05/19/2023)   PRAPARE - Administrator, Civil Service (Medical): No    Lack of Transportation (Non-Medical): No  Physical Activity: Not on file  Stress: Not on file  Social Connections: Moderately Integrated (05/19/2023)   Social Connection and Isolation Panel [NHANES]    Frequency of Communication with Friends and Family: More than three times a week    Frequency of Social Gatherings with Friends and Family: More than three times a week    Attends Religious Services: Never    Database administrator or Organizations: Yes    Attends Engineer, structural: More than 4 times per year    Marital Status: Married  Catering manager Violence: Not At Risk (05/19/2023)   Humiliation, Afraid, Rape, and Kick questionnaire    Fear of Current or Ex-Partner: No    Emotionally Abused: No    Physically Abused: No    Sexually Abused: No     Allergies  Allergen Reactions   Carboplatin  Anaphylaxis, Shortness Of Breath, Cough and Hypertension    On dose 8. Became short of breath. Symptom management was called. Received benadryl , solumedrol, Pepcid  and fluids. Medication discontinued.    Klonopin [Clonazepam] Other (See Comments)    nervous   Norco [Hydrocodone -Acetaminophen ] Other (See Comments)    Made pt feel jittery      Immunization History  Administered Date(s) Administered   Fluad Trivalent(High Dose 65+) 09/30/2022   Influenza Split 11/20/2008   Influenza,inj,Quad PF,6+ Mos 10/24/2018, 11/08/2018, 01/15/2020   Influenza,inj,quad, With Preservative 03/15/2015   PFIZER(Purple Top)SARS-COV-2 Vaccination 04/27/2019, 05/11/2019, 12/07/2019, 12/26/2019   PNEUMOCOCCAL CONJUGATE-20 05/12/2021   Pneumococcal Polysaccharide-23 12/12/2007, 10/10/2019   Td 05/18/2014   Tdap 11/16/2006, 05/18/2014    Outpatient  Medications Prior to Visit  Medication Sig Dispense Refill   arformoterol  (BROVANA ) 15 MCG/2ML NEBU Take 2 mLs (15 mcg total) by nebulization 2 (two) times daily. 120 mL 1   bisacodyl  (DULCOLAX) 10 MG suppository Place 1 suppository (10 mg total) rectally daily as needed for moderate constipation.     budesonide  (PULMICORT ) 0.5 MG/2ML nebulizer solution Take 2 mLs (0.5 mg total) by nebulization in the morning and at bedtime. 120 mL 12   furosemide  (LASIX ) 40 MG tablet Take 1 tablet (40 mg total) by mouth daily. 90 tablet 0   guaiFENesin  (MUCINEX ) 600 MG 12 hr tablet Take 1 tablet (600 mg total) by mouth 2 (two) times daily. 60 tablet 0   HYDROcodone -acetaminophen  (NORCO/VICODIN) 5-325 MG tablet Take 1 tablet by mouth every 12 (twelve) hours  as needed for moderate pain (pain score 4-6). 30 tablet 0   ipratropium-albuterol  (DUONEB) 0.5-2.5 (3) MG/3ML SOLN Take 3 mLs by nebulization every 4 (four) hours as needed (Shortness of breath, wheezing). 360 mL 0   magnesium  oxide (MAG-OX) 400 MG tablet Take 1 tablet (400 mg total) by mouth daily. 30 tablet 0   Multiple Vitamins-Minerals (MULTI ADULT GUMMIES) CHEW Chew 1 tablet by mouth daily.     naphazoline-pheniramine (VISINE) 0.025-0.3 % ophthalmic solution Place 1 drop into both eyes daily as needed for eye irritation.     pantoprazole  (PROTONIX ) 40 MG tablet Take 1 tablet (40 mg total) by mouth daily. 90 tablet 0   polyethylene glycol (MIRALAX  / GLYCOLAX ) 17 g packet Take 17 g by mouth daily.     predniSONE  (DELTASONE ) 10 MG tablet Take 4 tablets (40 mg total) by mouth daily with breakfast for 7 days, THEN 3 tablets (30 mg total) daily with breakfast for 7 days, THEN 2 tablets (20 mg total) daily with breakfast for 7 days, THEN 1 tablet (10 mg total) daily with breakfast. 125 tablet 0   revefenacin  (YUPELRI ) 175 MCG/3ML nebulizer solution Take 3 mLs (175 mcg total) by nebulization daily. 90 mL 1   rivaroxaban  (XARELTO ) 20 MG TABS tablet Take 1 tablet (20  mg total) by mouth daily with supper. 30 tablet 0   senna-docusate (SENOKOT-S) 8.6-50 MG tablet Take 1 tablet by mouth at bedtime as needed for moderate constipation.     tamsulosin  (FLOMAX ) 0.4 MG CAPS capsule Take 1 capsule (0.4 mg total) by mouth at bedtime. 30 capsule 0   No facility-administered medications prior to visit.   Review of systems: N/AA   Objective:   Vitals:   06/16/23 1430  BP: (!) 138/91  Pulse: (!) 128  SpO2: 92%  Weight: 198 lb 6.4 oz (90 kg)  Height: 5\' 6"  (1.676 m)   92% on   RA BMI Readings from Last 3 Encounters:  06/16/23 32.02 kg/m  05/27/23 33.09 kg/m  05/19/23 31.25 kg/m   Wt Readings from Last 3 Encounters:  06/16/23 198 lb 6.4 oz (90 kg)  05/27/23 205 lb 0.4 oz (93 kg)  05/19/23 199 lb 8.3 oz (90.5 kg)   Physical exam General: Sitting in wheelchair, chronically ill-appearing Pulmonary: Relatively clear but distant, normal work of breathing on oxygen Neuro: No focal deficits, sensation appears intact Cardiovascular: edema to just above ankle bilaterally, warm Psych: Normal mood, full affect   CBC    Component Value Date/Time   WBC 12.1 (H) 05/28/2023 0540   RBC 3.66 (L) 05/28/2023 0540   HGB 10.2 (L) 05/28/2023 0540   HGB 13.8 03/26/2023 1503   HCT 33.6 (L) 05/28/2023 0540   PLT 367 05/28/2023 0540   PLT 236 03/26/2023 1503   MCV 91.8 05/28/2023 0540   MCH 27.9 05/28/2023 0540   MCHC 30.4 05/28/2023 0540   RDW 14.7 05/28/2023 0540   LYMPHSABS 0.5 (L) 05/06/2023 0558   MONOABS 0.5 05/06/2023 0558   EOSABS 0.0 05/06/2023 0558   BASOSABS 0.0 05/06/2023 0558    CHEMISTRY No results for input(s): "NA", "K", "CL", "CO2", "GLUCOSE", "BUN", "CREATININE", "CALCIUM ", "MG", "PHOS" in the last 168 hours.  Estimated Creatinine Clearance: 87.6 mL/min (by C-G formula based on SCr of 0.86 mg/dL).   Chest Imaging- films reviewed: Multiple CT scans reviewed  Micro: 01/09/2019 AFB: Component 1 mo ago  Organism ID [id number]     Comment: Mycobacterium avium complex  Amikacin Comment   Comment: 16.0  ug/mL Susceptible  Clarithromycin 2.0 ug/mL Susceptible   Linezolid Comment   Comment: 16.0 ug/mL Intermediate  Moxifloxacin 4.0 ug/mL Resistant   Streptomycin 64.0 ug/mL    01/09/2019 fungus culture-negative 01/09/2019 BAL-many PMN, negative culture 06/17/2019 respiratory culture-normal flora 04/20/23 respiratory culture Pseudomonas, pansensitive  Pulmonary Functions Testing Results:    Latest Ref Rng & Units 12/21/2018   12:38 PM  PFT Results  FVC-Pre L 3.07   FVC-Predicted Pre % 76   FVC-Post L 3.23   FVC-Predicted Post % 80   Pre FEV1/FVC % % 67   Post FEV1/FCV % % 71   FEV1-Pre L 2.07   FEV1-Predicted Pre % 68   FEV1-Post L 2.28   DLCO uncorrected ml/min/mmHg 15.47   DLCO UNC% % 64   DLVA Predicted % 79   TLC L 5.35   TLC % Predicted % 86   RV % Predicted % 104    2020- mild obstruction without significant bronchodilator reversibility.  No significant restriction, air trapping, or hyperinflation.  Mildly reduced diffusion.  Flow volume loop supports obstruction.  Pathology 01/09/2019:  RLL adenocarcinoma November 2020 bronch nondiagnostic     Assessment & Plan:     ICD-10-CM   1. COPD, moderate (HCC)  J44.9     2. Acute on chronic hypoxic respiratory failure (HCC)  J96.21     3. Recurrent non-small cell lung cancer (HCC)  C34.90       Pseudomonas pneumonia: Hospitalized 04/2023.  Treated appropriately.  Chronic hypoxemic respiratory failure: Intermittent in the past.  Persistent since hospitalization pneumonia 01/2022.  Gradually worsening over time.  He has no reserve with his significant emphysema and radiation fibrosis, lung cancer.  I do not anticipate this will get much better.  Discussed anticipated will eventually continue to worsen.  Fortunate, with some improvement in oxygenation with initiation of Lasix .   -- Goal O2 sats 88% to 90%  Lung adenocarcinoma- stage IIIc (T4N3M0).   Previously received carboplatin , paclitaxel , and radiation from Dec 2020-  mid March 2021, imfimzi 02 May 2019.  Unfortunate recurrence fall 2022 now on maintenance chemotherapy, carboplatin  stopped after second cycle given anaphylaxis. -Ongoing management per oncology.   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 Resistant to quinolones; intermediate susceptibility to linezolid.  Low suspicion for active disease. - Traditionally, would avoid ICS but given his ongoing symptoms of shortness of breath, continue Breztri  -PLEASE AVOID MONOTHERAPY WITH MACROLIDES DUE TO RISK OF INDUCIBLE RESISTANCE OF MAI.  Pulmonary embolus:  -Provoked in setting malignancy.  Xarelto  to continue indefinitely in the setting of malignancy.    Lower extremity swelling: -- Continue Lasix   RTC in 6 weeks with Dr. Marygrace Snellen.    Current Outpatient Medications:    arformoterol  (BROVANA ) 15 MCG/2ML NEBU, Take 2 mLs (15 mcg total) by nebulization 2 (two) times daily., Disp: 120 mL, Rfl: 1   bisacodyl  (DULCOLAX) 10 MG suppository, Place 1 suppository (10 mg total) rectally daily as needed for moderate constipation., Disp: , Rfl:    budesonide  (PULMICORT ) 0.5 MG/2ML nebulizer solution, Take 2 mLs (0.5 mg total) by nebulization in the morning and at bedtime., Disp: 120 mL, Rfl: 12   furosemide  (LASIX ) 40 MG tablet, Take 1 tablet (40 mg total) by mouth daily., Disp: 90 tablet, Rfl: 0   guaiFENesin  (MUCINEX ) 600 MG 12 hr tablet, Take 1 tablet (600 mg total) by mouth 2 (two) times daily., Disp: 60 tablet, Rfl:  0   HYDROcodone -acetaminophen  (NORCO/VICODIN) 5-325 MG tablet, Take 1 tablet by mouth every 12 (twelve) hours as needed for moderate pain (pain score 4-6)., Disp: 30 tablet, Rfl: 0   ipratropium-albuterol  (DUONEB) 0.5-2.5 (3) MG/3ML SOLN, Take 3 mLs by nebulization every 4 (four) hours as needed (Shortness of breath,  wheezing)., Disp: 360 mL, Rfl: 0   magnesium  oxide (MAG-OX) 400 MG tablet, Take 1 tablet (400 mg total) by mouth daily., Disp: 30 tablet, Rfl: 0   Multiple Vitamins-Minerals (MULTI ADULT GUMMIES) CHEW, Chew 1 tablet by mouth daily., Disp: , Rfl:    naphazoline-pheniramine (VISINE) 0.025-0.3 % ophthalmic solution, Place 1 drop into both eyes daily as needed for eye irritation., Disp: , Rfl:    pantoprazole  (PROTONIX ) 40 MG tablet, Take 1 tablet (40 mg total) by mouth daily., Disp: 90 tablet, Rfl: 0   polyethylene glycol (MIRALAX  / GLYCOLAX ) 17 g packet, Take 17 g by mouth daily., Disp: , Rfl:    predniSONE  (DELTASONE ) 10 MG tablet, Take 4 tablets (40 mg total) by mouth daily with breakfast for 7 days, THEN 3 tablets (30 mg total) daily with breakfast for 7 days, THEN 2 tablets (20 mg total) daily with breakfast for 7 days, THEN 1 tablet (10 mg total) daily with breakfast., Disp: 125 tablet, Rfl: 0   revefenacin  (YUPELRI ) 175 MCG/3ML nebulizer solution, Take 3 mLs (175 mcg total) by nebulization daily., Disp: 90 mL, Rfl: 1   rivaroxaban  (XARELTO ) 20 MG TABS tablet, Take 1 tablet (20 mg total) by mouth daily with supper., Disp: 30 tablet, Rfl: 0   senna-docusate (SENOKOT-S) 8.6-50 MG tablet, Take 1 tablet by mouth at bedtime as needed for moderate constipation., Disp: , Rfl:    tamsulosin  (FLOMAX ) 0.4 MG CAPS capsule, Take 1 capsule (0.4 mg total) by mouth at bedtime., Disp: 30 capsule, Rfl: 0  I spent 42 minutes care of patient including face-to-face visit, review of records, coordination of care.  Guerry Leek,  MD [facility] 06/16/2023 5:01 PM

## 2023-06-16 NOTE — Patient Instructions (Signed)
 No changes in medication, continue which you are currently on  Our fax number is [phone]  Letter to return to work on Monday, I will mention restrictions in the letter and in the FMLA paperwork  Return to clinic in 6 weeks or sooner as needed with Dr. Marygrace Snellen

## 2023-06-18 ENCOUNTER — Telehealth: Payer: Self-pay | Admitting: Pulmonary Disease

## 2023-06-18 ENCOUNTER — Telehealth: Payer: Self-pay

## 2023-06-18 NOTE — Telephone Encounter (Signed)
 FMLA form received 06/17/23 for patient from [employer].Patient was inormed of $29 service fee. Forms placed in folder inside Dr.Hunsucker's box 06/18/23.

## 2023-06-18 NOTE — Telephone Encounter (Signed)
 Verbal orders provided to Alroy Jericho of [facility] for patient to receive PT 2x/week for 4 weeks and 1x/week for 4 weeks. Orders given on behalf of Dellia Ferguson, NP. Verbal orders verbalized and confirmed.

## 2023-06-21 NOTE — Telephone Encounter (Signed)
 Forms have been placed in Dr. Cari Char folder.

## 2023-06-22 ENCOUNTER — Telehealth: Payer: Self-pay | Admitting: *Deleted

## 2023-06-22 NOTE — Telephone Encounter (Signed)
 Copied from CRM [id number]. Topic: General - Other >> Jun 18, 2023  9:52 AM Ambrose Junk wrote: Reason for CRM:  Alroy Jericho, physical therapist,  [phone] calling with questions regarding patient restrictions. Please call.  I called Heather and there was no answer- LMTCB

## 2023-06-24 NOTE — Telephone Encounter (Signed)
 Forms completed today. Thanks!

## 2023-06-25 ENCOUNTER — Telehealth: Payer: Self-pay | Admitting: Pulmonary Disease

## 2023-06-25 NOTE — Telephone Encounter (Signed)
 Incomplete FMLA paperwork fax'd per [place] office. Returned to Dr. Cari Char box to complete (Page 4 needed completion.)

## 2023-06-25 NOTE — Telephone Encounter (Signed)
 FMLA forms have been faxed as of 06/25/23.

## 2023-06-27 ENCOUNTER — Encounter (HOSPITAL_COMMUNITY): Payer: Self-pay

## 2023-06-28 ENCOUNTER — Other Ambulatory Visit: Payer: Self-pay | Admitting: Pulmonary Disease

## 2023-06-28 DIAGNOSIS — C3490 Malignant neoplasm of unspecified part of unspecified bronchus or lung: Secondary | ICD-10-CM

## 2023-06-28 DIAGNOSIS — R1013 Epigastric pain: Secondary | ICD-10-CM

## 2023-06-29 ENCOUNTER — Emergency Department (HOSPITAL_COMMUNITY)

## 2023-06-29 ENCOUNTER — Other Ambulatory Visit: Payer: Self-pay | Admitting: Internal Medicine

## 2023-06-29 ENCOUNTER — Ambulatory Visit (HOSPITAL_COMMUNITY)
Admission: RE | Admit: 2023-06-29 | Discharge: 2023-06-29 | Disposition: A | Discharge status: Home / Self Care | Source: Ambulatory Visit | Attending: Internal Medicine | Admitting: Internal Medicine

## 2023-06-29 ENCOUNTER — Inpatient Hospital Stay (HOSPITAL_BASED_OUTPATIENT_CLINIC_OR_DEPARTMENT_OTHER): Admitting: Physician Assistant

## 2023-06-29 ENCOUNTER — Telehealth: Payer: Self-pay | Admitting: Pulmonary Disease

## 2023-06-29 ENCOUNTER — Other Ambulatory Visit: Payer: Self-pay

## 2023-06-29 ENCOUNTER — Inpatient Hospital Stay (HOSPITAL_COMMUNITY)
Admission: EM | Admit: 2023-06-29 | Discharge: 2023-07-07 | DRG: 865 | Disposition: A | Discharge status: Other Acute Inpatient Hospital | Source: Ambulatory Visit | Attending: Family Medicine | Admitting: Family Medicine

## 2023-06-29 ENCOUNTER — Inpatient Hospital Stay

## 2023-06-29 ENCOUNTER — Encounter (HOSPITAL_COMMUNITY): Payer: Self-pay | Admitting: Emergency Medicine

## 2023-06-29 VITALS — BP 112/81 | HR 128 | Temp 97.7°F | Resp 24

## 2023-06-29 DIAGNOSIS — Z9221 Personal history of antineoplastic chemotherapy: Secondary | ICD-10-CM

## 2023-06-29 DIAGNOSIS — F419 Anxiety disorder, unspecified: Secondary | ICD-10-CM | POA: Diagnosis present

## 2023-06-29 DIAGNOSIS — J449 Chronic obstructive pulmonary disease, unspecified: Secondary | ICD-10-CM

## 2023-06-29 DIAGNOSIS — Z87891 Personal history of nicotine dependence: Secondary | ICD-10-CM

## 2023-06-29 DIAGNOSIS — R0602 Shortness of breath: Secondary | ICD-10-CM | POA: Diagnosis not present

## 2023-06-29 DIAGNOSIS — J439 Emphysema, unspecified: Secondary | ICD-10-CM | POA: Diagnosis present

## 2023-06-29 DIAGNOSIS — C3490 Malignant neoplasm of unspecified part of unspecified bronchus or lung: Secondary | ICD-10-CM

## 2023-06-29 DIAGNOSIS — J9809 Other diseases of bronchus, not elsewhere classified: Secondary | ICD-10-CM | POA: Diagnosis present

## 2023-06-29 DIAGNOSIS — I129 Hypertensive chronic kidney disease with stage 1 through stage 4 chronic kidney disease, or unspecified chronic kidney disease: Secondary | ICD-10-CM | POA: Diagnosis present

## 2023-06-29 DIAGNOSIS — Z515 Encounter for palliative care: Secondary | ICD-10-CM

## 2023-06-29 DIAGNOSIS — J8410 Pulmonary fibrosis, unspecified: Secondary | ICD-10-CM | POA: Diagnosis present

## 2023-06-29 DIAGNOSIS — I2781 Cor pulmonale (chronic): Secondary | ICD-10-CM | POA: Diagnosis present

## 2023-06-29 DIAGNOSIS — Z85118 Personal history of other malignant neoplasm of bronchus and lung: Secondary | ICD-10-CM

## 2023-06-29 DIAGNOSIS — I2782 Chronic pulmonary embolism: Secondary | ICD-10-CM | POA: Diagnosis present

## 2023-06-29 DIAGNOSIS — J90 Pleural effusion, not elsewhere classified: Secondary | ICD-10-CM | POA: Diagnosis present

## 2023-06-29 DIAGNOSIS — R000 Tachycardia, unspecified: Secondary | ICD-10-CM | POA: Diagnosis not present

## 2023-06-29 DIAGNOSIS — Z7901 Long term (current) use of anticoagulants: Secondary | ICD-10-CM

## 2023-06-29 DIAGNOSIS — Z79899 Other long term (current) drug therapy: Secondary | ICD-10-CM

## 2023-06-29 DIAGNOSIS — B348 Other viral infections of unspecified site: Secondary | ICD-10-CM | POA: Diagnosis not present

## 2023-06-29 DIAGNOSIS — R0600 Dyspnea, unspecified: Principal | ICD-10-CM

## 2023-06-29 DIAGNOSIS — Z7951 Long term (current) use of inhaled steroids: Secondary | ICD-10-CM

## 2023-06-29 DIAGNOSIS — E66811 Obesity, class 1: Secondary | ICD-10-CM | POA: Diagnosis present

## 2023-06-29 DIAGNOSIS — G8929 Other chronic pain: Secondary | ICD-10-CM | POA: Diagnosis present

## 2023-06-29 DIAGNOSIS — E669 Obesity, unspecified: Secondary | ICD-10-CM | POA: Diagnosis present

## 2023-06-29 DIAGNOSIS — E785 Hyperlipidemia, unspecified: Secondary | ICD-10-CM | POA: Diagnosis present

## 2023-06-29 DIAGNOSIS — J9601 Acute respiratory failure with hypoxia: Secondary | ICD-10-CM | POA: Diagnosis present

## 2023-06-29 DIAGNOSIS — Z9981 Dependence on supplemental oxygen: Secondary | ICD-10-CM

## 2023-06-29 DIAGNOSIS — J471 Bronchiectasis with (acute) exacerbation: Secondary | ICD-10-CM | POA: Diagnosis present

## 2023-06-29 DIAGNOSIS — Z923 Personal history of irradiation: Secondary | ICD-10-CM

## 2023-06-29 DIAGNOSIS — Z888 Allergy status to other drugs, medicaments and biological substances status: Secondary | ICD-10-CM

## 2023-06-29 DIAGNOSIS — Z79891 Long term (current) use of opiate analgesic: Secondary | ICD-10-CM

## 2023-06-29 DIAGNOSIS — Z6832 Body mass index (BMI) 32.0-32.9, adult: Secondary | ICD-10-CM

## 2023-06-29 DIAGNOSIS — J9621 Acute and chronic respiratory failure with hypoxia: Secondary | ICD-10-CM | POA: Diagnosis present

## 2023-06-29 DIAGNOSIS — Z800 Family history of malignant neoplasm of digestive organs: Secondary | ICD-10-CM

## 2023-06-29 DIAGNOSIS — B965 Pseudomonas (aeruginosa) (mallei) (pseudomallei) as the cause of diseases classified elsewhere: Secondary | ICD-10-CM | POA: Diagnosis present

## 2023-06-29 DIAGNOSIS — J984 Other disorders of lung: Secondary | ICD-10-CM | POA: Diagnosis present

## 2023-06-29 DIAGNOSIS — D649 Anemia, unspecified: Secondary | ICD-10-CM | POA: Diagnosis present

## 2023-06-29 DIAGNOSIS — Z7952 Long term (current) use of systemic steroids: Secondary | ICD-10-CM

## 2023-06-29 DIAGNOSIS — N400 Enlarged prostate without lower urinary tract symptoms: Secondary | ICD-10-CM | POA: Diagnosis present

## 2023-06-29 DIAGNOSIS — Z66 Do not resuscitate: Secondary | ICD-10-CM | POA: Diagnosis present

## 2023-06-29 LAB — CBC WITH DIFFERENTIAL (CANCER CENTER ONLY)
Abs Immature Granulocytes: 0.2 10*3/uL — ABNORMAL HIGH (ref 0.00–0.07)
Basophils Absolute: 0.1 10*3/uL (ref 0.0–0.1)
Basophils Relative: 1 %
Eosinophils Absolute: 0 10*3/uL (ref 0.0–0.5)
Eosinophils Relative: 0 %
HCT: 32.1 % — ABNORMAL LOW (ref 39.0–52.0)
Hemoglobin: 9.6 g/dL — ABNORMAL LOW (ref 13.0–17.0)
Immature Granulocytes: 2 %
Lymphocytes Relative: 4 %
Lymphs Abs: 0.3 10*3/uL — ABNORMAL LOW (ref 0.7–4.0)
MCH: 26 pg (ref 26.0–34.0)
MCHC: 29.9 g/dL — ABNORMAL LOW (ref 30.0–36.0)
MCV: 87 fL (ref 80.0–100.0)
Monocytes Absolute: 0.6 10*3/uL (ref 0.1–1.0)
Monocytes Relative: 6 %
Neutro Abs: 7.8 10*3/uL — ABNORMAL HIGH (ref 1.7–7.7)
Neutrophils Relative %: 87 %
Platelet Count: 331 10*3/uL (ref 150–400)
RBC: 3.69 MIL/uL — ABNORMAL LOW (ref 4.22–5.81)
RDW: 16 % — ABNORMAL HIGH (ref 11.5–15.5)
WBC Count: 9 10*3/uL (ref 4.0–10.5)
nRBC: 0 % (ref 0.0–0.2)

## 2023-06-29 LAB — CMP (CANCER CENTER ONLY)
ALT: 20 U/L (ref 0–44)
AST: 20 U/L (ref 15–41)
Albumin: 3.7 g/dL (ref 3.5–5.0)
Alkaline Phosphatase: 86 U/L (ref 38–126)
Anion gap: 5 (ref 5–15)
BUN: 18 mg/dL (ref 8–23)
CO2: 40 mmol/L — ABNORMAL HIGH (ref 22–32)
Calcium: 9.4 mg/dL (ref 8.9–10.3)
Chloride: 94 mmol/L — ABNORMAL LOW (ref 98–111)
Creatinine: 0.96 mg/dL (ref 0.61–1.24)
GFR, Estimated: >60 mL/min (ref >60)
Glucose, Bld: 136 mg/dL — ABNORMAL HIGH (ref 70–99)
Potassium: 4.8 mmol/L (ref 3.5–5.1)
Sodium: 139 mmol/L (ref 135–145)
Total Bilirubin: 0.4 mg/dL (ref 0.0–1.2)
Total Protein: 6.6 g/dL (ref 6.5–8.1)

## 2023-06-29 LAB — EXPECTORATED SPUTUM ASSESSMENT W GRAM STAIN, RFLX TO RESP C

## 2023-06-29 LAB — BLOOD GAS, ARTERIAL
Acid-Base Excess: 13.5 mmol/L — ABNORMAL HIGH (ref 0.0–2.0)
Bicarbonate: 39.8 mmol/L — ABNORMAL HIGH (ref 20.0–28.0)
Drawn by: 56037
O2 Content: 10 L/min
O2 Saturation: 88.3 %
Patient temperature: 36.6
pCO2 arterial: 55 mmHg — ABNORMAL HIGH (ref 32–48)
pH, Arterial: 7.47 — ABNORMAL HIGH (ref 7.35–7.45)
pO2, Arterial: 51 mmHg — ABNORMAL LOW (ref 83–108)

## 2023-06-29 LAB — MRSA NEXT GEN BY PCR, NASAL: MRSA by PCR Next Gen: NOT DETECTED

## 2023-06-29 MED ORDER — MORPHINE SULFATE (CONCENTRATE) 10 MG /0.5 ML PO SOLN
5.0000 mg | Freq: Four times a day (QID) | ORAL | Status: DC | PRN
  Administered 2023-06-29 – 2023-07-07 (×9): 5 mg via ORAL
  Filled 2023-06-29 (×9): qty 0.5

## 2023-06-29 MED ORDER — ARFORMOTEROL TARTRATE 15 MCG/2ML IN NEBU
15.0000 ug | INHALATION_SOLUTION | Freq: Two times a day (BID) | RESPIRATORY_TRACT | Status: DC
  Administered 2023-06-29 – 2023-07-07 (×16): 15 ug via RESPIRATORY_TRACT
  Filled 2023-06-29 (×15): qty 2

## 2023-06-29 MED ORDER — IOHEXOL 300 MG/ML  SOLN
100.0000 mL | Freq: Once | INTRAMUSCULAR | Status: AC | PRN
  Administered 2023-06-29: 100 mL via INTRAVENOUS

## 2023-06-29 MED ORDER — VANCOMYCIN HCL 1.25 G IV SOLR
1250.0000 mg | INTRAVENOUS | Status: DC
  Filled 2023-06-29: qty 25

## 2023-06-29 MED ORDER — ALPRAZOLAM 0.25 MG PO TABS
0.2500 mg | ORAL_TABLET | Freq: Once | ORAL | Status: AC
  Administered 2023-06-29: 0.25 mg via ORAL
  Filled 2023-06-29: qty 1

## 2023-06-29 MED ORDER — GUAIFENESIN ER 600 MG PO TB12: 600.0000 mg | ORAL_TABLET | Freq: Three times a day (TID) | ORAL | Status: DC | PRN

## 2023-06-29 MED ORDER — PANTOPRAZOLE SODIUM 40 MG PO TBEC
40.0000 mg | DELAYED_RELEASE_TABLET | Freq: Every day | ORAL | Status: DC
  Administered 2023-06-30 – 2023-07-07 (×8): 40 mg via ORAL
  Filled 2023-06-29 (×8): qty 1

## 2023-06-29 MED ORDER — IPRATROPIUM-ALBUTEROL 0.5-2.5 (3) MG/3ML IN SOLN
3.0000 mL | RESPIRATORY_TRACT | Status: DC | PRN
  Administered 2023-06-30 – 2023-07-04 (×3): 3 mL via RESPIRATORY_TRACT
  Filled 2023-06-29 (×3): qty 3

## 2023-06-29 MED ORDER — ALPRAZOLAM 0.25 MG PO TABS: 0.2500 mg | ORAL_TABLET | Freq: Three times a day (TID) | ORAL | Status: DC | PRN

## 2023-06-29 MED ORDER — BUDESONIDE 0.5 MG/2ML IN SUSP
0.5000 mg | Freq: Two times a day (BID) | RESPIRATORY_TRACT | Status: DC
  Administered 2023-06-29 – 2023-07-07 (×16): 0.5 mg via RESPIRATORY_TRACT
  Filled 2023-06-29 (×15): qty 2

## 2023-06-29 MED ORDER — TAMSULOSIN HCL 0.4 MG PO CAPS
0.4000 mg | ORAL_CAPSULE | Freq: Every day | ORAL | Status: DC
  Administered 2023-06-29 – 2023-07-06 (×8): 0.4 mg via ORAL
  Filled 2023-06-29 (×8): qty 1

## 2023-06-29 MED ORDER — PIPERACILLIN-TAZOBACTAM 3.375 G IVPB
3.3750 g | Freq: Three times a day (TID) | INTRAVENOUS | Status: DC
  Administered 2023-06-29 – 2023-06-30 (×3): 3.375 g via INTRAVENOUS
  Filled 2023-06-29 (×3): qty 50

## 2023-06-29 MED ORDER — TRAZODONE HCL 50 MG PO TABS: 25.0000 mg | ORAL_TABLET | Freq: Every evening | ORAL | Status: DC | PRN

## 2023-06-29 MED ORDER — FUROSEMIDE 40 MG PO TABS
40.0000 mg | ORAL_TABLET | Freq: Every day | ORAL | Status: DC
  Administered 2023-06-29 – 2023-07-03 (×5): 40 mg via ORAL
  Filled 2023-06-29 (×5): qty 1

## 2023-06-29 MED ORDER — FUROSEMIDE 10 MG/ML IJ SOLN
60.0000 mg | Freq: Once | INTRAMUSCULAR | Status: AC
  Administered 2023-06-29: 60 mg via INTRAVENOUS
  Filled 2023-06-29: qty 6

## 2023-06-29 MED ORDER — VANCOMYCIN HCL 1750 MG/350ML IV SOLN
1750.0000 mg | Freq: Once | INTRAVENOUS | Status: AC
  Administered 2023-06-29: 1750 mg via INTRAVENOUS
  Filled 2023-06-29: qty 350

## 2023-06-29 MED ORDER — ONDANSETRON HCL 4 MG/2ML IJ SOLN: 4.0000 mg | Freq: Four times a day (QID) | INTRAMUSCULAR | Status: DC | PRN

## 2023-06-29 MED ORDER — LORAZEPAM 0.5 MG PO TABS: 0.5000 mg | ORAL_TABLET | Freq: Four times a day (QID) | ORAL | Status: DC | PRN

## 2023-06-29 MED ORDER — HYDROCODONE-ACETAMINOPHEN 5-325 MG PO TABS
1.0000 | ORAL_TABLET | Freq: Four times a day (QID) | ORAL | Status: DC | PRN
  Administered 2023-06-29 – 2023-07-06 (×7): 1 via ORAL
  Filled 2023-06-29 (×7): qty 1

## 2023-06-29 MED ORDER — ALPRAZOLAM 0.25 MG PO TABS
0.2500 mg | ORAL_TABLET | Freq: Once | ORAL | Status: AC | PRN
  Administered 2023-06-29: 0.25 mg via ORAL
  Filled 2023-06-29: qty 1

## 2023-06-29 MED ORDER — MORPHINE SULFATE (CONCENTRATE) 10 MG /0.5 ML PO SOLN
5.0000 mg | Freq: Once | ORAL | Status: AC
  Administered 2023-06-29: 5 mg via ORAL
  Filled 2023-06-29: qty 0.5

## 2023-06-29 MED ORDER — REVEFENACIN 175 MCG/3ML IN SOLN
175.0000 ug | Freq: Every day | RESPIRATORY_TRACT | Status: DC
  Administered 2023-06-30 – 2023-07-07 (×8): 175 ug via RESPIRATORY_TRACT
  Filled 2023-06-29 (×10): qty 3

## 2023-06-29 MED ORDER — ACETAMINOPHEN 325 MG PO TABS: 650.0000 mg | ORAL_TABLET | Freq: Four times a day (QID) | ORAL | Status: DC | PRN

## 2023-06-29 MED ORDER — ONDANSETRON HCL 4 MG PO TABS: 4.0000 mg | ORAL_TABLET | Freq: Four times a day (QID) | ORAL | Status: DC | PRN

## 2023-06-29 MED ORDER — ACETAMINOPHEN 650 MG RE SUPP: 650.0000 mg | Freq: Four times a day (QID) | RECTAL | Status: DC | PRN

## 2023-06-29 MED ORDER — PREDNISONE 10 MG PO TABS
10.0000 mg | ORAL_TABLET | Freq: Every day | ORAL | Status: DC
  Administered 2023-06-30 – 2023-07-01 (×2): 10 mg via ORAL
  Filled 2023-06-29 (×2): qty 1

## 2023-06-29 MED ORDER — RIVAROXABAN 20 MG PO TABS
20.0000 mg | ORAL_TABLET | Freq: Every day | ORAL | Status: DC
  Administered 2023-06-29 – 2023-07-06 (×8): 20 mg via ORAL
  Filled 2023-06-29 (×8): qty 1

## 2023-06-29 MED ORDER — MAGNESIUM OXIDE -MG SUPPLEMENT 400 (240 MG) MG PO TABS
400.0000 mg | ORAL_TABLET | Freq: Every day | ORAL | Status: DC
  Administered 2023-06-29 – 2023-07-07 (×9): 400 mg via ORAL
  Filled 2023-06-29 (×9): qty 1

## 2023-06-29 NOTE — Telephone Encounter (Signed)
 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 Philip [name] would like to speak with someone, will wait for Monday. Please advise and call back.   I will call Philip [name] and resubmit ppwk to Dr. For completion.  Page 3, # 4, they can not accept just the word  Pulmonary. Also, if PT is going to have appt.'s and fu's in the future that needs to be noted as well. This needs to be noted at the bottom of page 3 under # 8. What are the "periods of incapacity"?

## 2023-06-29 NOTE — ED Triage Notes (Signed)
 Pt. Brought over from cancer center w/ c/o SOB and tachycardia. Pt. Originally tachy 120' s and O2 62% [place] after coming out of CT scan. Pt. On O2 @ home up to 10 liters. Pt. A&O x 4. Pt. Has suspected PNE.  Pt. On thinners.

## 2023-06-29 NOTE — ED Provider Notes (Signed)
 [place] EMERGENCY DEPARTMENT AT [facility] Provider Note   CSN: [id number] Arrival date & time: 06/29/23  1134     History  Chief Complaint  Patient presents with   Shortness of Breath    Philip [name] is a 68 y.o. male.  69 year old male with history of lung cancer and COPD on chronic oxygen therapy presents from cancer center due to shortness of breath and tachycardia.  Patient was having CT scan done today when he became more dyspneic.  Patient states that time at home he requires up to 10 L of oxygen.  Has noted persistent cough.  Suspect that patient may have pneumonia.  Patient was you treated for that a few weeks ago.  Denies any vomiting or diarrhea      Home Medications Prior to Admission medications   Medication Sig Start Date End Date Taking? Authorizing Provider  arformoterol  (BROVANA ) 15 MCG/2ML NEBU Take 2 mLs (15 mcg total) by nebulization 2 (two) times daily. 05/28/23   Gonfa, Taye T, MD  bisacodyl  (DULCOLAX) 10 MG suppository Place 1 suppository (10 mg total) rectally daily as needed for moderate constipation. 05/03/23   Amin, Ankit C, MD  budesonide  (PULMICORT ) 0.5 MG/2ML nebulizer solution Take 2 mLs (0.5 mg total) by nebulization in the morning and at bedtime. 05/28/23   Gonfa, Taye T, MD  furosemide  (LASIX ) 40 MG tablet Take 1 tablet (40 mg total) by mouth daily. 05/29/23   Gonfa, Taye T, MD  guaiFENesin  (MUCINEX ) 600 MG 12 hr tablet Take 1 tablet (600 mg total) by mouth 2 (two) times daily. 05/19/23   Angiulli, Everlyn Hockey, [name]  HYDROcodone -acetaminophen  (NORCO/VICODIN) 5-325 MG tablet Take 1 tablet by mouth every 12 (twelve) hours as needed for moderate pain (pain score 4-6). 05/19/23   Angiulli, Everlyn Hockey, [name]  ipratropium-albuterol  (DUONEB) 0.5-2.5 (3) MG/3ML SOLN Take 3 mLs by nebulization every 4 (four) hours as needed (Shortness of breath, wheezing). 05/28/23   Gonfa, Taye T, MD  magnesium  oxide (MAG-OX) 400 MG tablet Take 1 tablet (400 mg total)  by mouth daily. 05/19/23   Angiulli, Everlyn Hockey, [name]  Multiple Vitamins-Minerals (MULTI ADULT GUMMIES) CHEW Chew 1 tablet by mouth daily. 09/02/21   [provider]  naphazoline-pheniramine (VISINE) 0.025-0.3 % ophthalmic solution Place 1 drop into both eyes daily as needed for eye irritation.    [provider]  pantoprazole  (PROTONIX ) 40 MG tablet TAKE 1 TABLET BY MOUTH EVERY DAY 06/28/23   Hunsucker, Archer Kobs, MD  polyethylene glycol (MIRALAX  / GLYCOLAX ) 17 g packet Take 17 g by mouth daily. 05/12/23   Angiulli, Everlyn Hockey, [name]  predniSONE  (DELTASONE ) 10 MG tablet Take 4 tablets (40 mg total) by mouth daily with breakfast for 7 days, THEN 3 tablets (30 mg total) daily with breakfast for 7 days, THEN 2 tablets (20 mg total) daily with breakfast for 7 days, THEN 1 tablet (10 mg total) daily with breakfast. 05/29/23 08/20/23  Theadore Finger, MD  revefenacin  (YUPELRI ) 175 MCG/3ML nebulizer solution Take 3 mLs (175 mcg total) by nebulization daily. 05/29/23   Gonfa, Taye T, MD  rivaroxaban  (XARELTO ) 20 MG TABS tablet Take 1 tablet (20 mg total) by mouth daily with supper. 05/19/23   Angiulli, Everlyn Hockey, [name]  tamsulosin  (FLOMAX ) 0.4 MG CAPS capsule Take 1 capsule (0.4 mg total) by mouth at bedtime. 05/19/23   Angiulli, Everlyn Hockey, [name]      Allergies    Carboplatin , Klonopin [clonazepam], and Norco [hydrocodone -acetaminophen ]  Review of Systems   Review of Systems  All other systems reviewed and are negative.   Physical Exam Updated Vital Signs Ht 1.676 m (5\' 6" )   Wt 90 kg   SpO2 98% Comment: [place] @ 6 liters  BMI 32.02 kg/m  Physical Exam Vitals and nursing note reviewed.  Constitutional:      General: He is not in acute distress.    Appearance: Normal appearance. He is well-developed. He is not toxic-appearing.  HENT:     Head: Normocephalic and atraumatic.  Eyes:     General: Lids are normal.     Conjunctiva/sclera: Conjunctivae normal.     Pupils: Pupils are equal, round, and  reactive to light.  Neck:     Thyroid : No thyroid  mass.     Trachea: No tracheal deviation.  Cardiovascular:     Rate and Rhythm: Regular rhythm. Tachycardia present.     Heart sounds: Normal heart sounds. No murmur heard.    No gallop.  Pulmonary:     Effort: Tachypnea, prolonged expiration and respiratory distress present.     Breath sounds: No stridor. Examination of the right-upper field reveals decreased breath sounds. Examination of the left-upper field reveals decreased breath sounds. Decreased breath sounds present. No wheezing, rhonchi or rales.  Abdominal:     General: There is no distension.     Palpations: Abdomen is soft.     Tenderness: There is no abdominal tenderness. There is no rebound.  Musculoskeletal:        General: No tenderness. Normal range of motion.     Cervical back: Normal range of motion and neck supple.  Skin:    General: Skin is warm and dry.     Findings: No abrasion or rash.  Neurological:     Mental Status: He is alert and oriented to person, place, and time. Mental status is at baseline.     GCS: GCS eye subscore is 4. GCS verbal subscore is 5. GCS motor subscore is 6.     Cranial Nerves: No cranial nerve deficit.     Sensory: No sensory deficit.     Motor: Motor function is intact.  Psychiatric:        Attention and Perception: Attention normal.        Speech: Speech normal.        Behavior: Behavior normal.    ED Results / Procedures / Treatments   Labs (all labs ordered are listed, but only abnormal results are displayed) Labs Reviewed - No data to display  EKG None  Radiology No results found.  Procedures Procedures    Medications Ordered in ED Medications - No data to display  ED Course/ Medical Decision Making/ A&P                                 Medical Decision Making Amount and/or Complexity of Data Reviewed Radiology: ordered.   Patient's labs reviewed from today without really significant abnormality.  Chest  x-ray here shows right-sided pleural effusion.  Patient had outpatient CT chest abdomen pelvis which does show moderate right-sided pleural effusion.  Patient continues to have oxygen requirement above his baseline here.  Will require admission.  Discussed with hospitalist team and they will admit        Final Clinical Impression(s) / ED Diagnoses Final diagnoses:  None    Rx / DC Orders ED Discharge Orders     None  Lind Repine, MD 06/29/23 1435

## 2023-06-29 NOTE — Progress Notes (Signed)
 Symptom Management Consult Note [facility]    Patient Care Team: [name], MD as PCP - General (Family Medicine) Jaye Mettle, RN as Oncology Nurse Navigator Lemuel Quaker, RN as Case Manager (General Practice) Pickenpack-Cousar, Giles Labrum, NP as Nurse Practitioner (Nurse Practitioner)    Name / MRN / DOB: Philip [name]  [id number]  09-Sep-1955   Date of visit: 06/29/2023   Chief Complaint/Reason for visit: shortness of breath     ASSESSMENT AND PLAN Patient is a 68 y.o. male with oncologic history of recurrent non-small cell lung cancer initially diagnosed as stage IIIc followed by Dr. [name].  I have viewed most recent oncology note and lab work.  #recurrent non-small cell lung cancer initially diagnosed as stage IIIc  - Next appointment with oncologist is 07/06/23 - Patient is not on active treatment. Per oncologist not a candidate for further treatments  #Shortness of breath - Recent history of pneumonia 3/20 with complications leading to second admission. Most recent discharge date is 05/28/23. - CT CAP was performed PTA, radiologist's report pending.  RN did call the reading room to request a STAT read. - Reviewed labs from this morning, WBC WNL, stable anemia, elevated CO2 compared to prior labs x 1 month ago (36-->40)). - Patient will need further evaluation in the emergency department. Oncologist notified and he agrees. Patient transported to ED by RN.  HEME/ONC HISTORY Oncology History  Adenocarcinoma of right lung, stage 3 (HCC)  01/13/2019 Initial Diagnosis   Adenocarcinoma of right lung, stage 3 (HCC)   01/30/2019 - 03/06/2019 Chemotherapy   The patient had palonosetron  (ALOXI ) injection 0.25 mg, 0.25 mg, Intravenous,  Once, 6 of 7 cycles Administration: 0.25 mg (01/30/2019), 0.25 mg (02/06/2019), 0.25 mg (02/27/2019), 0.25 mg (03/06/2019), 0.25 mg (02/13/2019), 0.25 mg (02/20/2019) CARBOplatin  (PARAPLATIN ) 210 mg in sodium chloride  0.9 % 250 mL  chemo infusion, 210 mg (100 % of original dose 209.2 mg), Intravenous,  Once, 6 of 7 cycles Dose modification: 209.2 mg (original dose 209.2 mg, Cycle 1), 209.2 mg (original dose 209.2 mg, Cycle 5), 242.8 mg (original dose 209.2 mg, Cycle 5, Reason: Change in SCr/CrCl) Administration: 210 mg (01/30/2019), 210 mg (02/06/2019), 210 mg (02/27/2019), 210 mg (03/06/2019), 210 mg (02/13/2019), 210 mg (02/20/2019) PACLitaxel  (TAXOL ) 90 mg in sodium chloride  0.9 % 250 mL chemo infusion (</= 80mg /m2), 45 mg/m2 = 90 mg, Intravenous,  Once, 6 of 7 cycles Administration: 90 mg (01/30/2019), 90 mg (02/06/2019), 90 mg (02/27/2019), 90 mg (03/06/2019), 90 mg (02/13/2019), 90 mg (02/20/2019)  for chemotherapy treatment.    04/19/2019 - 04/18/2020 Chemotherapy   Patient is on Treatment Plan : LUNG NSCLC Durvalumab  q28d     03/21/2020 Cancer Staging   Staging form: Lung, AJCC 8th Edition - Clinical: Stage IIIC (cT4, cN3, cM0) - Signed by Marlene Simas, MD on 03/21/2020   01/01/2021 -  Chemotherapy   Patient is on Treatment Plan : LUNG Carboplatin  (5) + Pemetrexed  (500) + Pembrolizumab  (200) D1 q21d Induction x 4 cycles / Maintenance Pemetrexed  (500) + Pembrolizumab  (200) D1 q21d         INTERVAL HISTORY  Discussed the use of AI scribe software for clinical note transcription with the patient, who gave verbal consent to proceed.    Philip [name] is a 68 y.o. male with oncologic history as above presenting to [facility] today with chief complaint of shortness of breath. Accompanied to clinic today by spouse who provides additional history.   Patient here today  for CT CAP. He reported to lab staff he is worried he has pneumonia again. He has been experiencing respiratory symptoms similar to previous episodes of pneumonia, including increased sputum production, difficulty breathing, and a lack of appetite. His diminished taste affects his enjoyment of food, which is distressing as he identifies as a 'foodie.'  He is currently  taking Lasix  every other day and uses inhalers and pulmonary medications including budesonide , Brovana , and Yuperli. He is also on prednisone , which he increased from 10 mg to 20 mg daily due to worsening symptoms, though without significant improvement. Duo nebs are used as needed for breathing difficulties, he is unsure if they are helping.  He uses a nasal cannula for oxygen therapy, typically at six liters per minute, adjusting the flow based on activity level and symptoms. He will increase to 10L with activity. He reports occasional epistaxis, likely due to dryness, and uses saline rinses to manage this. His right nostril remains persistently clogged.     ROS  All other systems are reviewed and are negative for acute change except as noted in the HPI.    Allergies  Allergen Reactions   Carboplatin  Anaphylaxis, Shortness Of Breath, Cough and Hypertension    On dose 8. Became short of breath. Symptom management was called. Received benadryl , solumedrol, Pepcid  and fluids. Medication discontinued.    Klonopin [Clonazepam] Other (See Comments)    nervous   Norco [Hydrocodone -Acetaminophen ] Other (See Comments)    Made pt feel jittery      Past Medical History:  Diagnosis Date   Chronic kidney disease    COPD (chronic obstructive pulmonary disease) (HCC)    Dyspnea    GERD (gastroesophageal reflux disease)    Headache    migraines   Hyperlipidemia    lung ca dx'd 12/2018   On home oxygen therapy    3 to 10 L   Pneumonia    Pulmonary embolus (HCC)    Tobacco abuse      Past Surgical History:  Procedure Laterality Date   COLONOSCOPY     CYSTOSCOPY W/ URETERAL STENT PLACEMENT Right 10/24/2021   Procedure: CYSTOSCOPY WITH RETROGRADE PYELOGRAM/URETERAL STENT PLACEMENT;  Surgeon: Lahoma Pigg, MD;  Location: [facility];  Service: Urology;  Laterality: Right;   CYSTOSCOPY WITH RETROGRADE PYELOGRAM, URETEROSCOPY AND STENT PLACEMENT Right 10/12/2022   Procedure: CYSTOSCOPY WITH  RIGHT  RETROGRADE PYELOGRAM, RIGHT STENT EXCHANGE;  Surgeon: Lahoma Pigg, MD;  Location: [facility];  Service: Urology;  Laterality: Right;   HAND SURGERY     VASECTOMY     VIDEO BRONCHOSCOPY WITH ENDOBRONCHIAL ULTRASOUND N/A 12/22/2018   Procedure: VIDEO BRONCHOSCOPY WITH ENDOBRONCHIAL ULTRASOUND;  Surgeon: Prudy Brownie, DO;  Location: [facility];  Service: Thoracic;  Laterality: N/A;   VIDEO BRONCHOSCOPY WITH ENDOBRONCHIAL ULTRASOUND N/A 01/09/2019   Procedure: VIDEO BRONCHOSCOPY WITH ENDOBRONCHIAL ULTRASOUND WITH FLUORO;  Surgeon: Prudy Brownie, DO;  Location: [facility];  Service: Thoracic;  Laterality: N/A;   VIDEO BRONCHOSCOPY WITH RADIAL ENDOBRONCHIAL ULTRASOUND N/A 12/22/2018   Procedure: RADIAL ENDOBRONCHIAL ULTRASOUND;  Surgeon: Prudy Brownie, DO;  Location: [facility];  Service: Thoracic;  Laterality: N/A;    Social History   Socioeconomic History   Marital status: Married    Spouse name: Not on file   Number of children: Not on file   Years of education: Not on file   Highest education level: Not on file  Occupational History   Not on file  Tobacco Use   Smoking status:  Former    Current packs/day: 0.00    Average packs/day: 2.0 packs/day for 51.4 years (102.8 ttl pk-yrs)    Types: Cigarettes    Start date: 07/04/1970    Quit date: 12/2021    Years since quitting: 1.5   Smokeless tobacco: Former    Types: Chew   Tobacco comments:    Smoking 2 packs of cigarettes a week. 10/14/2021 Tay    Smoking is on and off, will go a week without smoking then start up and smoke half a pack a day  Vaping Use   Vaping status: Never Used  Substance and Sexual Activity   Alcohol use: Yes    Comment: hasn't drank in one month   Drug use: Not Currently   Sexual activity: Not on file  Other Topics Concern   Not on file  Social History Narrative   Not on file   Social Drivers of Health   Financial Resource Strain: Not on file  Food Insecurity: No Food Insecurity (05/19/2023)   Hunger  Vital Sign    Worried About Running Out of Food in the Last Year: Never true    Ran Out of Food in the Last Year: Never true  Transportation Needs: No Transportation Needs (05/19/2023)   PRAPARE - Administrator, Civil Service (Medical): No    Lack of Transportation (Non-Medical): No  Physical Activity: Not on file  Stress: Not on file  Social Connections: Moderately Integrated (05/19/2023)   Social Connection and Isolation Panel [NHANES]    Frequency of Communication with Friends and Family: More than three times a week    Frequency of Social Gatherings with Friends and Family: More than three times a week    Attends Religious Services: Never    Database administrator or Organizations: Yes    Attends Engineer, structural: More than 4 times per year    Marital Status: Married  Catering manager Violence: Not At Risk (05/19/2023)   Humiliation, Afraid, Rape, and Kick questionnaire    Fear of Current or Ex-Partner: No    Emotionally Abused: No    Physically Abused: No    Sexually Abused: No    Family History  Problem Relation Age of Onset   Cancer Father    Stomach cancer Father      Current Outpatient Medications:    arformoterol  (BROVANA ) 15 MCG/2ML NEBU, Take 2 mLs (15 mcg total) by nebulization 2 (two) times daily., Disp: 120 mL, Rfl: 1   bisacodyl  (DULCOLAX) 10 MG suppository, Place 1 suppository (10 mg total) rectally daily as needed for moderate constipation., Disp: , Rfl:    budesonide  (PULMICORT ) 0.5 MG/2ML nebulizer solution, Take 2 mLs (0.5 mg total) by nebulization in the morning and at bedtime., Disp: 120 mL, Rfl: 12   furosemide  (LASIX ) 40 MG tablet, Take 1 tablet (40 mg total) by mouth daily., Disp: 90 tablet, Rfl: 0   guaiFENesin  (MUCINEX ) 600 MG 12 hr tablet, Take 1 tablet (600 mg total) by mouth 2 (two) times daily., Disp: 60 tablet, Rfl: 0   HYDROcodone -acetaminophen  (NORCO/VICODIN) 5-325 MG tablet, Take 1 tablet by mouth every 12 (twelve) hours  as needed for moderate pain (pain score 4-6)., Disp: 30 tablet, Rfl: 0   ipratropium-albuterol  (DUONEB) 0.5-2.5 (3) MG/3ML SOLN, Take 3 mLs by nebulization every 4 (four) hours as needed (Shortness of breath, wheezing)., Disp: 360 mL, Rfl: 0   magnesium  oxide (MAG-OX) 400 MG tablet, Take 1 tablet (400 mg total) by mouth  daily., Disp: 30 tablet, Rfl: 0   Multiple Vitamins-Minerals (MULTI ADULT GUMMIES) CHEW, Chew 1 tablet by mouth daily., Disp: , Rfl:    naphazoline-pheniramine (VISINE) 0.025-0.3 % ophthalmic solution, Place 1 drop into both eyes daily as needed for eye irritation., Disp: , Rfl:    pantoprazole  (PROTONIX ) 40 MG tablet, TAKE 1 TABLET BY MOUTH EVERY DAY, Disp: 90 tablet, Rfl: 1   polyethylene glycol (MIRALAX  / GLYCOLAX ) 17 g packet, Take 17 g by mouth daily., Disp: , Rfl:    predniSONE  (DELTASONE ) 10 MG tablet, Take 4 tablets (40 mg total) by mouth daily with breakfast for 7 days, THEN 3 tablets (30 mg total) daily with breakfast for 7 days, THEN 2 tablets (20 mg total) daily with breakfast for 7 days, THEN 1 tablet (10 mg total) daily with breakfast., Disp: 125 tablet, Rfl: 0   revefenacin  (YUPELRI ) 175 MCG/3ML nebulizer solution, Take 3 mLs (175 mcg total) by nebulization daily., Disp: 90 mL, Rfl: 1   rivaroxaban  (XARELTO ) 20 MG TABS tablet, Take 1 tablet (20 mg total) by mouth daily with supper., Disp: 30 tablet, Rfl: 0   tamsulosin  (FLOMAX ) 0.4 MG CAPS capsule, Take 1 capsule (0.4 mg total) by mouth at bedtime., Disp: 30 capsule, Rfl: 0  PHYSICAL EXAM ECOG FS:2 - Symptomatic, <50% confined to bed    Vitals:   06/29/23 1000  BP: 112/81  Pulse: (!) 128  Resp: (!) 24  Temp: 97.7 F (36.5 C)  TempSrc: Temporal  SpO2: 94%   Physical Exam Vitals and nursing note reviewed.  Constitutional:      Appearance: He is ill-appearing. He is not toxic-appearing.  HENT:     Head: Normocephalic.     Mouth/Throat:     Mouth: Mucous membranes are dry.  Eyes:     Conjunctiva/sclera:  Conjunctivae normal.  Cardiovascular:     Rate and Rhythm: Regular rhythm. Tachycardia present.     Pulses: Normal pulses.     Heart sounds: Normal heart sounds.  Pulmonary:     Effort: Respiratory distress present.     Comments: Tachypneic diminished breath sounds in all lung field.  Wearing nasal cannula and face mask Abdominal:     General: There is no distension.  Musculoskeletal:     Cervical back: Normal range of motion.  Skin:    General: Skin is warm and dry.  Neurological:     Mental Status: He is alert.        LABORATORY DATA I have reviewed the data as listed    Latest Ref Rng & Units 06/29/2023    9:03 AM 05/28/2023    5:40 AM 05/25/2023    3:57 AM  CBC  WBC 4.0 - 10.5 K/uL 9.0  12.1  10.0   Hemoglobin 13.0 - 17.0 g/dL 9.6  47.8  9.8   Hematocrit 39.0 - 52.0 % 32.1  33.6  32.0   Platelets 150 - 400 K/uL 331  367  352         Latest Ref Rng & Units 06/29/2023    9:03 AM 05/28/2023    5:40 AM 05/27/2023    8:35 AM  CMP  Glucose 70 - 99 mg/dL 295  621  308   BUN 8 - 23 mg/dL 18  20  21    Creatinine 0.61 - 1.24 mg/dL 6.57  8.46  9.62   Sodium 135 - 145 mmol/L 139  138  138   Potassium 3.5 - 5.1 mmol/L 4.8  4.0  3.6  Chloride 98 - 111 mmol/L 94  99  96   CO2 22 - 32 mmol/L 40  36  33   Calcium  8.9 - 10.3 mg/dL 9.4  8.0  8.2   Total Protein 6.5 - 8.1 g/dL 6.6     Total Bilirubin 0.0 - 1.2 mg/dL 0.4     Alkaline Phos 38 - 126 U/L 86     AST 15 - 41 U/L 20     ALT 0 - 44 U/L 20          RADIOGRAPHIC STUDIES (from last 24 hours if applicable) I have personally reviewed the radiological images as listed and agreed with the findings in the report. DG Chest Port 1 View Result Date: 06/29/2023 CLINICAL DATA:  sob EXAM: PORTABLE CHEST - 1 VIEW COMPARISON:  05/24/2023 FINDINGS: Perihilar scarring right greater than left, slightly increased. Stable moderate right pleural effusion with lateral thickening or loculation. Heart size and within normal limits.  Visualized bones unremarkable. Interval PICC line removal. IMPRESSION: 1. Slight increase in perihilar scarring. 2. Stable right pleural effusion. Electronically Signed   By: Nicoletta Barrier M.D.   On: 06/29/2023 13:16   CT CHEST ABDOMEN PELVIS W CONTRAST Result Date: 06/29/2023 CLINICAL DATA:  Non-small cell lung carcinoma, staging post treatment. Cough, shortness of breath EXAM: CT CHEST, ABDOMEN, AND PELVIS WITH CONTRAST TECHNIQUE: Multidetector CT imaging of the chest, abdomen and pelvis was performed following the standard protocol during bolus administration of intravenous contrast. RADIATION DOSE REDUCTION: This exam was performed according to the departmental dose-optimization program which includes automated exposure control, adjustment of the mA and/or kV according to patient size and/or use of iterative reconstruction technique. CONTRAST:  OMNIPAQUE  IOHEXOL  300 MG/ML  SOLN COMPARISON:  05/25/2023 and previous FINDINGS: CT CHEST FINDINGS Cardiovascular: Heart size normal. Trace pericardial fluid. 3-vessel coronary calcifications. Minimal calcified aortic plaque. Mediastinum/Nodes: Subcentimeter anterior mediastinal and subcarinal lymph nodes. No hilar adenopathy. Lungs/Pleura: Persistent small moderate right pleural effusion with mild smooth pleural thickening at the base. No pneumothorax. Advanced emphysema. Stable pericardial scarring/fibrosis right worse than left. No new infiltrate. Musculoskeletal: Vertebral endplate spurring at multiple levels in the lower thoracic spine. CT ABDOMEN PELVIS FINDINGS Hepatobiliary: No focal liver abnormality is seen. No gallstones, gallbladder wall thickening, or biliary dilatation. Pancreas: Unremarkable. No pancreatic ductal dilatation or surrounding inflammatory changes. Spleen: Normal in size without focal abnormality. Adrenals/Urinary Tract: 3 cm left adrenal adenoma, previously 2.4 cm on 06/16/2019. Symmetric renal parenchymal enhancement, without focal  lesion. Right double-J ureteral stent in expected location, without hydronephrosis. Urinary bladder partially distended. Stomach/Bowel: Stomach is nondistended. Small bowel decompressed. Appendix not localized. The colon is partially distended, without acute finding. Vascular/Lymphatic: Scattered aortoiliac calcified atheromatous plaque. Portal vein patent. No abdominal or pelvic adenopathy. Reproductive: Mild prostate enlargement. Other: Pelvic phleboliths.  No ascites.  No free air. Musculoskeletal: Degenerative disc disease and facet DJD L5-S1. IMPRESSION: 1. Persistent small to moderate right pleural effusion with mild smooth pleural thickening at the base. 2. No new or progressive findings. 3. Right double-J ureteral stent in expected location, without hydronephrosis. 4. Aortic Atherosclerosis (ICD10-I70.0) and Emphysema (ICD10-J43.9). Electronically Signed   By: Nicoletta Barrier M.D.   On: 06/29/2023 13:15        Visit Diagnosis: 1. Recurrent non-small cell lung cancer (HCC)   2. Shortness of breath      No orders of the defined types were placed in this encounter.   All questions were answered. The patient knows to call the clinic  with any problems, questions or concerns. No barriers to learning was detected.  A total of more than 40 minutes were spent on this encounter with face-to-face time and non-face-to-face time, including preparing to see the patient, ordering tests and/or medications, counseling the patient and coordination of care as outlined above.    Thank you for allowing me to participate in the care of this patient.    Izola Teague E  Walisiewicz, [name] Department of Hematology/Oncology [facility] Phone: [phone]  [phone]    06/29/2023 1:32 PM

## 2023-06-29 NOTE — Progress Notes (Signed)
 Patient transferred to ED Room 19 for further evaluation for new productive cough, tachycardia, increased oxygen needs. Possible work-up for suspected pneumonia. Transferred patient via wheelchair with 6 L [place]. Sister, [name] present at time of transfer. Patient had 22 G PIV to LAC from CT scan prior this AM.  Bedside report given to [place], Charity fundraiser.

## 2023-06-29 NOTE — Consult Note (Addendum)
 NAME:  Philip [name], MRN:  [id number], DOB:  02-15-1955, LOS: 0 ADMISSION DATE:  06/29/2023, CONSULTATION DATE:  06/29/22 REFERRING MD:  Mir CHIEF COMPLAINT:  Dyspnea   History of Present Illness:  [name] is a 68 y.o. 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 He is followed by Dr. Marygrace Snellen in the clinic.  He was getting a routine surveillance CT of the abdomen and pelvis on 5/27 and while at the facility, he had significant dyspnea which prompted ED evaluation. In ED, he was evaluated and admitted by the hospital service and PCCM was called in consultation for assistance with his pulmonary care.  To the pulmonary critical care team the sister [name] and the patient expressed significant fear of dying, anxiety about dying, class IV dyspnea at baseline.  Requesting anxiolytic medications.  Also chronic pain on opioids.  They feel like they want relief from the symptoms in order to help her quality of life.  He is willing to try BiPAP but reports that in the past at [facility] when BiPAP was tried proper instructions were not given and is frustrated by that patient experience.  Pertinent  Medical History:  has Lung mass; S/P bronchoscopy; S/P bronchoscopy with biopsy; Pneumothorax;  Adenocarcinoma of right lung, stage 3 (HCC); Goals of care, counseling/discussion; Encounter for antineoplastic chemotherapy; Tobacco abuse counseling; Odynophagia; Encounter for antineoplastic immunotherapy; Chronic pulmonary embolism; COPD (chronic obstructive pulmonary disease) (HCC); Pneumonia; MAI (mycobacterium avium-intracellulare) infection (HCC); Acute on chronic hypoxic respiratory failure (HCC); Healthcare maintenance; Allergic rhinitis; Pulmonary embolism (HCC); Recurrent non-small cell lung cancer (HCC); Neutropenia (HCC); COPD, moderate (HCC); Preoperative clearance; CAP (community acquired pneumonia); Tobacco abuse; BPH (benign prostatic hyperplasia); GERD (gastroesophageal reflux disease); History of lung cancer; Radiation-induced pulmonary fibrosis (HCC); Chronic migraine without aura; Hyperlipidemia; COPD with acute exacerbation (HCC); COPD exacerbation (HCC); Class I obesity; Acute respiratory failure (HCC); Hypokalemia; Chronic low back pain; Leukocytosis; Constipation; Generalized edema; Acute hypoxemic respiratory failure (HCC); Acute pulmonary edema (HCC); Pleural effusion; Radiation fibrosis of lung (HCC); Acute on chronic respiratory failure with hypoxia (HCC); Current chronic use of systemic steroids; and Acute respiratory failure with hypoxia (HCC) on their problem list.       Latest Ref Rng & Units 12/21/2018   12:38 PM  PFT Results  FVC-Pre L 3.07   FVC-Predicted Pre % 76   FVC-Post L 3.23   FVC-Predicted Post % 80   Pre FEV1/FVC % % 67   Post FEV1/FCV % % 71   FEV1-Pre L 2.07   FEV1-Predicted Pre % 68   FEV1-Post L 2.28   DLCO uncorrected ml/min/mmHg 15.47   DLCO [facility]% % 64   DLVA Predicted % 79   TLC L 5.35   TLC % Predicted % 86   RV % Predicted % 104      Significant Hospital Events: Including procedures, antibiotic start and  stop dates in addition to other pertinent events   5/27 admit.  Interim History / Subjective:  06/29/2023 seen in bed [address]    Objective:  Blood pressure 111/80, pulse (!) 121, temperature (P) 97.9 F (36.6 C), temperature source (P) Oral, resp. rate (!) 25, height 5\' 6"  (1.676 m), weight 90 kg, SpO2 96%.       No intake or output data in the 24 hours ending 06/29/23 1605 Filed Weights   06/29/23 1149  Weight: 90 kg     Physical Exam: General: Nasal cannula oxygen present.  He is obese. Neuro: Alert and oriented x 3 but he is anxious HEENT: Oxygen present.  Mallampati class III Cardiovascular: Tachycardic Lungs: Barrel chested with some wheezing and mildly paradoxical.  Class IV dyspnea. Abdomen: Obese nontender Musculoskeletal: He seems to have 1-2+ edema that is worse than baseline Skin: Intact GU: x    LABS    PULMONARY No results for input(s): "PHART", "PCO2ART", "PO2ART", "HCO3", "TCO2", "O2SAT" in the last 168 hours.  Invalid input(s): "PCO2", "PO2"  CBC Recent Labs  Lab 06/29/23 0903  HGB 9.6*  HCT 32.1*  WBC 9.0  PLT 331    COAGULATION No results for input(s): "INR" in the last 168 hours.  CARDIAC  No results for input(s): "TROPONINI" in the last 168 hours. No results for input(s): "PROBNP" in the last 168 hours.   CHEMISTRY Recent Labs  Lab 06/29/23 0903  NA 139  K 4.8  CL 94*  CO2 40*  GLUCOSE 136*  BUN 18  CREATININE 0.96  CALCIUM  9.4   Estimated Creatinine Clearance: 78.5 mL/min (by C-G formula based on SCr of 0.96 mg/dL).   LIVER Recent Labs  Lab 06/29/23 0903  AST 20  ALT 20  ALKPHOS 86  BILITOT 0.4  PROT 6.6  ALBUMIN  3.7     INFECTIOUS No results for input(s): "LATICACIDVEN", "PROCALCITON" in the last 168 hours.   ENDOCRINE CBG (last 3)  No results for input(s): "GLUCAP" in the last 72 hours.       IMAGING x48h  - image(s) personally visualized  -   highlighted in bold DG Chest Port 1 View Result Date: 06/29/2023 CLINICAL DATA:  sob EXAM: PORTABLE CHEST - 1 VIEW COMPARISON:  05/24/2023 FINDINGS: Perihilar scarring right greater  than left, slightly increased. Stable moderate right pleural effusion with lateral thickening or loculation. Heart size and within normal limits. Visualized bones unremarkable. Interval PICC line removal. IMPRESSION: 1. Slight increase in perihilar scarring. 2. Stable right pleural effusion. Electronically Signed   By: Nicoletta Barrier M.D.   On: 06/29/2023 13:16   CT CHEST ABDOMEN PELVIS W CONTRAST Result Date: 06/29/2023 CLINICAL DATA:  Non-small cell lung carcinoma, staging post treatment. Cough, shortness of breath EXAM: CT CHEST, ABDOMEN, AND PELVIS WITH CONTRAST TECHNIQUE: Multidetector CT imaging of the chest, abdomen and pelvis was performed following the standard protocol during bolus administration of intravenous contrast. RADIATION DOSE REDUCTION: This exam was performed according to the departmental dose-optimization program which includes automated exposure control, adjustment of the mA and/or kV according to patient size and/or use of iterative reconstruction technique. CONTRAST:  OMNIPAQUE  IOHEXOL  300 MG/ML  SOLN COMPARISON:  05/25/2023 and previous FINDINGS: CT CHEST FINDINGS Cardiovascular: Heart size normal. Trace pericardial fluid. 3-vessel coronary calcifications. Minimal calcified aortic plaque. Mediastinum/Nodes: Subcentimeter anterior mediastinal and subcarinal lymph nodes. No hilar adenopathy. Lungs/Pleura: Persistent small moderate right pleural effusion with mild smooth pleural thickening at the base. No pneumothorax. Advanced emphysema. Stable pericardial  scarring/fibrosis right worse than left. No new infiltrate. Musculoskeletal: Vertebral endplate spurring at multiple levels in the lower thoracic spine. CT ABDOMEN PELVIS FINDINGS Hepatobiliary: No focal liver abnormality is seen. No gallstones, gallbladder wall thickening, or biliary dilatation. Pancreas: Unremarkable. No pancreatic ductal dilatation or surrounding inflammatory changes. Spleen: Normal in size without focal  abnormality. Adrenals/Urinary Tract: 3 cm left adrenal adenoma, previously 2.4 cm on 06/16/2019. Symmetric renal parenchymal enhancement, without focal lesion. Right double-J ureteral stent in expected location, without hydronephrosis. Urinary bladder partially distended. Stomach/Bowel: Stomach is nondistended. Small bowel decompressed. Appendix not localized. The colon is partially distended, without acute finding. Vascular/Lymphatic: Scattered aortoiliac calcified atheromatous plaque. Portal vein patent. No abdominal or pelvic adenopathy. Reproductive: Mild prostate enlargement. Other: Pelvic phleboliths.  No ascites.  No free air. Musculoskeletal: Degenerative disc disease and facet DJD L5-S1. IMPRESSION: 1. Persistent small to moderate right pleural effusion with mild smooth pleural thickening at the base. 2. No new or progressive findings. 3. Right double-J ureteral stent in expected location, without hydronephrosis. 4. Aortic Atherosclerosis (ICD10-I70.0) and Emphysema (ICD10-J43.9). Electronically Signed   By: Nicoletta Barrier M.D.   On: 06/29/2023 13:15    Allergies  Allergen Reactions   Carboplatin  Anaphylaxis, Shortness Of Breath, Cough and Hypertension    On dose 8. Became short of breath. Symptom management was called. Received benadryl , solumedrol, Pepcid  and fluids. Medication discontinued.    Klonopin [Clonazepam] Other (See Comments)    nervous   Norco [Hydrocodone -Acetaminophen ] Other (See Comments)    Made pt feel jittery      Labs/imaging personally reviewed:  CXR 5/27 > stable right effusion.  Assessment & Plan:   Acute on chronic hypoxic respiratory failure, On 5 L of oxygen at baseline.- multifactorial secondary to multiple pulmonary problems below.   Acute on chronic COPD/emphysema, steroid-dependent on 10 mg prednisone  daily. 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 Hx small cell lung cancer stage IIIc, in remission. Hx pulmonary MAI infection,  last evaluated in 2020 and not previously treated. Recent Pseudomonas pneumonia s/p treatment. Chronic small right pleural effusion. Chronic cor pulmonale, group 3. Tracheal bronchial malacia. Acute on chronic pulmonary edema. DNR/DNI.   Currently Anxiety Chronic Pain Acute on chronic respiratory failure with COPD exacerbation and likely diastolic failure  PLAN -  - LASIX  60mg  IV x 1 and then daily -> . Track BNP/Trop and consider ECHO (last March 2025) - Check ABG but - Start BiPAP at bedtime  - wants education - Continue supplemental oxygen to maintain SpO2 greater than 88%. - Check RVP, PCt, urine strep , urine legionella - Continue steroids. IVC - Triple therapy nebs in lieu of PTA Breztri . - Empiric antibiotics IV HCAP (given green sputum)   - vanc 5/27 (dc if MRSA PCR neg)  - zosyn  5/27  - Palliative relief of anxiety and dyspnea  - Morphine  syrup x 1 and then prn opiopids  - Xanax  x 1 and then prn benzo  - Consider bedside POCUS for pleural effusion on 5/28.- pssibly on. - PT/OT.     Best practice (evaluated daily):  Per primary team.    NPO except sips and meds but if patient is keen to eat,ok     ATTESTATION & SIGNATURE   The patient BRAXTIN BAMBA is critically ill with multiple organ systems failure and requires high complexity decision making for assessment and support, frequent evaluation and titration of therapies, application of advanced monitoring technologies and extensive interpretation of multiple databases and discussion with other appropriate health care  personnel such as bedside nurses, social workers, Sports coach, Pharmacologist, respiratory therapists, nutritionists, secretaries etc.,  Critical care time includes but is not restricted to just documentation time. Documentation can happen in parallel or sequential to care time depending on case mix urgency and priorities for the shift. So, overall critical Care Time devoted to patient care  services described in this note is  40  Minutes.   This time reflects time of care of this signee Dr Maire Scot which includ does not reflect procedure time, or teaching time or supervisory time of PA/NP/Med student/Med Resident etc but could involve care discussion time     Dr. Maire Scot, M.D., [facility].C.P Pulmonary and Critical Care Medicine Staff Physician, [facility] Pager: [phone], If no answer or between  15:00h - 7:00h: call [phone]  06/29/2023 6:01 PM

## 2023-06-29 NOTE — Progress Notes (Signed)
 Pharmacy Antibiotic Note  Philip [name] is a 68 y.o. male admitted on 06/29/2023 with pneumonia.  Pharmacy has been consulted for vanc/zosyn dosing.  Plan: Vanc 1750mg  IV x 1 then 1250mg  IV q24 - goal AUC 400-550 Zosyn 3.375g IV q8 (extended interval infusion) Daily SCr  Height: 5\' 6"  (167.6 cm) Weight: 90.1 kg (198 lb 10.2 oz) IBW/kg (Calculated) : 63.8  Temp (24hrs), Avg:97.9 F (36.6 C), Min:97.7 F (36.5 C), Max:97.9 F (36.6 C)  Recent Labs  Lab 06/29/23 0903  WBC 9.0  CREATININE 0.96    Estimated Creatinine Clearance: 78.5 mL/min (by C-G formula based on SCr of 0.96 mg/dL).    Allergies  Allergen Reactions   Carboplatin  Anaphylaxis, Shortness Of Breath, Cough and Hypertension    On dose 8. Became short of breath. Symptom management was called. Received benadryl , solumedrol, Pepcid  and fluids. Medication discontinued.    Klonopin [Clonazepam] Other (See Comments)    nervous   Norco [Hydrocodone -Acetaminophen ] Other (See Comments)    Made pt feel jittery      Thank you for allowing pharmacy to be a part of this patient's care.  Bernett Brill 06/29/2023 6:06 PM

## 2023-06-29 NOTE — H&P (Addendum)
 History and Physical  Philip [name] JYN:[id number] DOB: [birth year] DOA: 06/29/2023  PCP: [name], MD   Chief Complaint: shortness of breath   HPI: Philip [name] is a 68 y.o. 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  History is provided by the patient, as well as his sister who is at the bedside.  Chronically he is on 5 L nasal cannula oxygen, has titrated up as high as 10 L/min with any kind of exertion, even trying to sit up straight in a chair.  Been this way for many months.  He was on chemotherapy under the care of Dr. Liam Redhead, however carboplatin  was stopped after second cycle given anaphylaxis, and documentation indicates that no further chemotherapy is planned.  Patient denies any chest pain, fevers, chills, nausea or vomiting.  States that for a long time he did not have any cough, but for the last couple weeks he has had cough productive of thick greenish-yellow sputum.  Today he went to the cancer center for a follow-up appointment, while getting his repeat CT chest abdomen pelvis he became very dyspneic and tachycardic.  He was therefore brought to the ER for evaluation.  Review of Systems: Please see HPI for pertinent positives and negatives. A complete 10 system review of systems are otherwise negative.  Past Medical History:  Diagnosis Date   Chronic kidney disease    COPD (chronic obstructive pulmonary disease) (HCC)    Dyspnea    GERD (gastroesophageal reflux disease)    Headache    migraines   Hyperlipidemia    lung ca dx'd 12/2018   On home oxygen therapy    3 to 10 L   Pneumonia    Pulmonary embolus (HCC)    Tobacco abuse    Past Surgical History:  Procedure  Laterality Date   COLONOSCOPY     CYSTOSCOPY W/ URETERAL STENT PLACEMENT Right 10/24/2021   Procedure: CYSTOSCOPY WITH RETROGRADE PYELOGRAM/URETERAL STENT PLACEMENT;  Surgeon: Lahoma Pigg, MD;  Location: [facility];  Service: Urology;  Laterality: Right;   CYSTOSCOPY WITH RETROGRADE PYELOGRAM, URETEROSCOPY AND STENT PLACEMENT Right 10/12/2022   Procedure: CYSTOSCOPY WITH RIGHT  RETROGRADE PYELOGRAM, RIGHT STENT EXCHANGE;  Surgeon: Lahoma Pigg, MD;  Location: [facility];  Service: Urology;  Laterality: Right;   HAND SURGERY     VASECTOMY     VIDEO BRONCHOSCOPY WITH ENDOBRONCHIAL ULTRASOUND N/A 12/22/2018   Procedure: VIDEO BRONCHOSCOPY WITH ENDOBRONCHIAL ULTRASOUND;  Surgeon: Prudy Brownie, DO;  Location: [facility];  Service: Thoracic;  Laterality: N/A;   VIDEO BRONCHOSCOPY WITH ENDOBRONCHIAL ULTRASOUND N/A 01/09/2019   Procedure: VIDEO BRONCHOSCOPY WITH ENDOBRONCHIAL ULTRASOUND WITH FLUORO;  Surgeon: Prudy Brownie, DO;  Location: [facility];  Service: Thoracic;  Laterality: N/A;   VIDEO BRONCHOSCOPY WITH RADIAL ENDOBRONCHIAL ULTRASOUND N/A 12/22/2018   Procedure: RADIAL ENDOBRONCHIAL ULTRASOUND;  Surgeon: Prudy Brownie, DO;  Location: [facility];  Service: Thoracic;  Laterality: N/A;   Social History:  reports that he quit smoking about 18 months ago. His smoking use included cigarettes. He started smoking about 53 years ago. He has a 102.8 pack-year smoking history. He has quit using smokeless tobacco.  His smokeless tobacco use included chew. He reports current alcohol use. He reports that  he does not currently use drugs.  Allergies  Allergen Reactions   Carboplatin  Anaphylaxis, Shortness Of Breath, Cough and Hypertension    On dose 8. Became short of breath. Symptom management was called. Received benadryl , solumedrol, Pepcid  and fluids. Medication discontinued.    Klonopin [Clonazepam] Other (See Comments)    nervous   Norco [Hydrocodone -Acetaminophen ] Other (See Comments)    Made pt feel jittery      Family History  Problem Relation Age of Onset   Cancer Father    Stomach cancer Father      Prior to Admission medications   Medication Sig Start Date End Date Taking? Authorizing Provider  arformoterol  (BROVANA ) 15 MCG/2ML NEBU Take 2 mLs (15 mcg total) by nebulization 2 (two) times daily. 05/28/23  Yes Gonfa, Taye T, MD  budesonide  (PULMICORT ) 0.5 MG/2ML nebulizer solution Take 2 mLs (0.5 mg total) by nebulization in the morning and at bedtime. 05/28/23  Yes Gonfa, Taye T, MD  furosemide  (LASIX ) 40 MG tablet Take 1 tablet (40 mg total) by mouth daily. 05/29/23  Yes Gonfa, Taye T, MD  guaiFENesin  (MUCINEX ) 600 MG 12 hr tablet Take 1 tablet (600 mg total) by mouth 2 (two) times daily. Patient taking differently: Take 600 mg by mouth 3 (three) times daily as needed for to loosen phlegm or cough. 05/19/23  Yes Angiulli, Everlyn Hockey, [name]  HYDROcodone -acetaminophen  (NORCO/VICODIN) 5-325 MG tablet Take 1 tablet by mouth every 12 (twelve) hours as needed for moderate pain (pain score 4-6). Patient taking differently: Take 1 tablet by mouth in the morning, at noon, and at bedtime. 05/19/23  Yes Angiulli, Everlyn Hockey, [name]  ipratropium-albuterol  (DUONEB) 0.5-2.5 (3) MG/3ML SOLN Take 3 mLs by nebulization every 4 (four) hours as needed (Shortness of breath, wheezing). Patient taking differently: Take 3 mLs by nebulization in the morning and at bedtime. 05/28/23  Yes Gonfa, Taye T, MD  magnesium  oxide (MAG-OX) 400 MG tablet Take 1 tablet (400 mg total) by mouth daily. 05/19/23  Yes Angiulli, Everlyn Hockey, [name]  Multiple Vitamins-Minerals (MULTI ADULT GUMMIES) CHEW Chew 2 tablets by mouth daily. 09/02/21  Yes [provider]  naphazoline-pheniramine (VISINE) 0.025-0.3 % ophthalmic solution Place 1 drop into both eyes daily as needed for eye irritation.   Yes [provider]  pantoprazole  (PROTONIX ) 40 MG tablet TAKE 1 TABLET BY MOUTH EVERY DAY 06/28/23  Yes Hunsucker, Archer Kobs, MD  predniSONE   (DELTASONE ) 10 MG tablet Take 4 tablets (40 mg total) by mouth daily with breakfast for 7 days, THEN 3 tablets (30 mg total) daily with breakfast for 7 days, THEN 2 tablets (20 mg total) daily with breakfast for 7 days, THEN 1 tablet (10 mg total) daily with breakfast. Patient taking differently: Take 10 mg by mouth once daily  05/29/23 08/20/23 Yes Gonfa, Taye T, MD  revefenacin  (YUPELRI ) 175 MCG/3ML nebulizer solution Take 3 mLs (175 mcg total) by nebulization daily. Patient taking differently: Take 175 mcg by nebulization in the morning and at bedtime. 05/29/23  Yes Gonfa, Taye T, MD  rivaroxaban  (XARELTO ) 20 MG TABS tablet Take 1 tablet (20 mg total) by mouth daily with supper. 05/19/23  Yes Angiulli, Everlyn Hockey, [name]  tamsulosin  (FLOMAX ) 0.4 MG CAPS capsule Take 1 capsule (0.4 mg total) by mouth at bedtime. 05/19/23  Yes Sterling Eisenmenger, [name]    Physical Exam: BP 111/80   Pulse (!) 121   Temp (P) 97.9 F (36.6 C) (Oral)   Resp (!) 25   Ht 5\' 6"  (1.676  m)   Wt 90 kg   SpO2 96%   BMI 32.02 kg/m  General:  Alert, oriented, calm, in no acute distress, currently wearing 5 L nasal cannula oxygen.  His sister is at the bedside. Eyes: EOMI, clear conjuctivae, white sclerea Neck: supple, no masses, trachea mildline  Cardiovascular: RRR, no murmurs or rubs, no significant peripheral edema  Respiratory: Breath sounds are globally diminished, he has crackles at bilateral lung bases, some mild rhonchi.  Becomes tachypneic just from speaking a few words. Abdomen: soft, nontender, distended, normal bowel tones heard  Skin: dry, no rashes  Musculoskeletal: no joint effusions, normal range of motion  Psychiatric: appropriate affect, normal speech  Neurologic: extraocular muscles intact, clear speech, moving all extremities with intact sensorium         Labs on Admission:  Basic Metabolic Panel: Recent Labs  Lab 06/29/23 0903  NA 139  K 4.8  CL 94*  CO2 40*  GLUCOSE 136*  BUN 18  CREATININE  0.96  CALCIUM  9.4   Liver Function Tests: Recent Labs  Lab 06/29/23 0903  AST 20  ALT 20  ALKPHOS 86  BILITOT 0.4  PROT 6.6  ALBUMIN  3.7   No results for input(s): "LIPASE", "AMYLASE" in the last 168 hours. No results for input(s): "AMMONIA" in the last 168 hours. CBC: Recent Labs  Lab 06/29/23 0903  WBC 9.0  NEUTROABS 7.8*  HGB 9.6*  HCT 32.1*  MCV 87.0  PLT 331   Cardiac Enzymes: No results for input(s): "CKTOTAL", "CKMB", "CKMBINDEX", "TROPONINI" in the last 168 hours. BNP (last 3 results) Recent Labs    05/01/23 0438 05/13/23 1725 05/21/23 1000  BNP 91.8 200.8* 214.6*    ProBNP (last 3 results) No results for input(s): "PROBNP" in the last 8760 hours.  CBG: No results for input(s): "GLUCAP" in the last 168 hours.  Radiological Exams on Admission: DG Chest Port 1 View Result Date: 06/29/2023 CLINICAL DATA:  sob EXAM: PORTABLE CHEST - 1 VIEW COMPARISON:  05/24/2023 FINDINGS: Perihilar scarring right greater than left, slightly increased. Stable moderate right pleural effusion with lateral thickening or loculation. Heart size and within normal limits. Visualized bones unremarkable. Interval PICC line removal. IMPRESSION: 1. Slight increase in perihilar scarring. 2. Stable right pleural effusion. Electronically Signed   By: Nicoletta Barrier M.D.   On: 06/29/2023 13:16   CT CHEST ABDOMEN PELVIS W CONTRAST Result Date: 06/29/2023 CLINICAL DATA:  Non-small cell lung carcinoma, staging post treatment. Cough, shortness of breath EXAM: CT CHEST, ABDOMEN, AND PELVIS WITH CONTRAST TECHNIQUE: Multidetector CT imaging of the chest, abdomen and pelvis was performed following the standard protocol during bolus administration of intravenous contrast. RADIATION DOSE REDUCTION: This exam was performed according to the departmental dose-optimization program which includes automated exposure control, adjustment of the mA and/or kV according to patient size and/or use of iterative  reconstruction technique. CONTRAST:  OMNIPAQUE  IOHEXOL  300 MG/ML  SOLN COMPARISON:  05/25/2023 and previous FINDINGS: CT CHEST FINDINGS Cardiovascular: Heart size normal. Trace pericardial fluid. 3-vessel coronary calcifications. Minimal calcified aortic plaque. Mediastinum/Nodes: Subcentimeter anterior mediastinal and subcarinal lymph nodes. No hilar adenopathy. Lungs/Pleura: Persistent small moderate right pleural effusion with mild smooth pleural thickening at the base. No pneumothorax. Advanced emphysema. Stable pericardial scarring/fibrosis right worse than left. No new infiltrate. Musculoskeletal: Vertebral endplate spurring at multiple levels in the lower thoracic spine. CT ABDOMEN PELVIS FINDINGS Hepatobiliary: No focal liver abnormality is seen. No gallstones, gallbladder wall thickening, or biliary dilatation. Pancreas: Unremarkable. No pancreatic ductal  dilatation or surrounding inflammatory changes. Spleen: Normal in size without focal abnormality. Adrenals/Urinary Tract: 3 cm left adrenal adenoma, previously 2.4 cm on 06/16/2019. Symmetric renal parenchymal enhancement, without focal lesion. Right double-J ureteral stent in expected location, without hydronephrosis. Urinary bladder partially distended. Stomach/Bowel: Stomach is nondistended. Small bowel decompressed. Appendix not localized. The colon is partially distended, without acute finding. Vascular/Lymphatic: Scattered aortoiliac calcified atheromatous plaque. Portal vein patent. No abdominal or pelvic adenopathy. Reproductive: Mild prostate enlargement. Other: Pelvic phleboliths.  No ascites.  No free air. Musculoskeletal: Degenerative disc disease and facet DJD L5-S1. IMPRESSION: 1. Persistent small to moderate right pleural effusion with mild smooth pleural thickening at the base. 2. No new or progressive findings. 3. Right double-J ureteral stent in expected location, without hydronephrosis. 4. Aortic Atherosclerosis (ICD10-I70.0) and  Emphysema (ICD10-J43.9). Electronically Signed   By: Nicoletta Barrier M.D.   On: 06/29/2023 13:15   Assessment/Plan Philip [name] is a 68 y.o. 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  Acute on chronic hypoxic respiratory failure-he has COPD with chronic hypoxic respiratory failure as well as history of pulmonary MAI infection and known right sided pleural effusion.  He has increased sputum production and persistent cough which are atypical for him, but otherwise no significant change in his symptomatology. - Inpatient admission -Continue supplemental oxygen, with goal O2 saturation 88 to 90% -Continue nebulized LAMA, LABA, ICS, prednisone  10 mg p.o. daily -Will empirically diurese with Lasix  60 mg IV x 1 now -Check sputum culture, but will hold off on antibiotics for the time being -Requested pulmonology consultation for further recommendations  Stage III lung adenocarcinoma-under the care of Dr. [name], no longer on treatment -Norco as needed for pain  History of PE-continue Xarelto  indefinitely in the setting of malignancy  BPH-Flomax     Code Status: Limited: Do not attempt resuscitation (DNR) -DNR-LIMITED -Do Not Intubate/DNI  *Extensive discussion with the patient as well as his sister at the time of admission.  He is quite clear that he would never want to be intubated, because he knows he would be unlikely to ever be weaned off ventilatory support.  He also requests no CPR.  Consults called: Pulm  Admission status: The appropriate patient status for this patient is INPATIENT. Inpatient status is judged to be reasonable and necessary in order to provide the required intensity of service to ensure  the patient's safety. The patient's presenting symptoms, physical exam findings, and initial radiographic and laboratory data in the context of their chronic comorbidities is felt to place them at high risk for further clinical deterioration. Furthermore, it is not anticipated that the patient will be medically stable for discharge from the hospital within 2 midnights of admission.    I certify that at the point of admission it is my clinical judgment that the patient will require inpatient hospital care spanning beyond 2 midnights from the point of admission due to high intensity of service, high risk for further deterioration and high frequency of surveillance required  Due to a high probability of clinically significant, life threatening deterioration, the patient required my highest level of preparedness to intervene emergently and I personally spent this critical care time directly and personally managing the patient. This critical care time included  obtaining a history; examining the patient; reviewing vitals; ordering and review of studies; arranging urgent treatment with development of a management plan; evaluation of patient's response to treatment; frequent reassessment; and, discussions with other providers as well as available family.  Total critical care time: Approximately 60 minutes  Guenther Dunshee Rickey Charm MD [facility] Pager [phone]  If 7PM-7AM, please contact night-coverage [email or web address] Password [facility]  06/29/2023, 4:01 PM

## 2023-06-30 DIAGNOSIS — Z800 Family history of malignant neoplasm of digestive organs: Secondary | ICD-10-CM

## 2023-06-30 DIAGNOSIS — B348 Other viral infections of unspecified site: Principal | ICD-10-CM | POA: Diagnosis present

## 2023-06-30 DIAGNOSIS — J449 Chronic obstructive pulmonary disease, unspecified: Secondary | ICD-10-CM | POA: Diagnosis not present

## 2023-06-30 DIAGNOSIS — G894 Chronic pain syndrome: Secondary | ICD-10-CM | POA: Diagnosis not present

## 2023-06-30 DIAGNOSIS — Z7901 Long term (current) use of anticoagulants: Secondary | ICD-10-CM

## 2023-06-30 DIAGNOSIS — E785 Hyperlipidemia, unspecified: Secondary | ICD-10-CM | POA: Diagnosis present

## 2023-06-30 DIAGNOSIS — C3491 Malignant neoplasm of unspecified part of right bronchus or lung: Secondary | ICD-10-CM | POA: Diagnosis not present

## 2023-06-30 DIAGNOSIS — J8410 Pulmonary fibrosis, unspecified: Secondary | ICD-10-CM | POA: Diagnosis present

## 2023-06-30 DIAGNOSIS — B965 Pseudomonas (aeruginosa) (mallei) (pseudomallei) as the cause of diseases classified elsewhere: Secondary | ICD-10-CM | POA: Diagnosis present

## 2023-06-30 DIAGNOSIS — J439 Emphysema, unspecified: Secondary | ICD-10-CM | POA: Diagnosis present

## 2023-06-30 DIAGNOSIS — Z9221 Personal history of antineoplastic chemotherapy: Secondary | ICD-10-CM | POA: Diagnosis not present

## 2023-06-30 DIAGNOSIS — E669 Obesity, unspecified: Secondary | ICD-10-CM | POA: Diagnosis present

## 2023-06-30 DIAGNOSIS — Z923 Personal history of irradiation: Secondary | ICD-10-CM

## 2023-06-30 DIAGNOSIS — Z66 Do not resuscitate: Secondary | ICD-10-CM | POA: Diagnosis present

## 2023-06-30 DIAGNOSIS — Z6832 Body mass index (BMI) 32.0-32.9, adult: Secondary | ICD-10-CM

## 2023-06-30 DIAGNOSIS — I2781 Cor pulmonale (chronic): Secondary | ICD-10-CM | POA: Diagnosis present

## 2023-06-30 DIAGNOSIS — D649 Anemia, unspecified: Secondary | ICD-10-CM | POA: Diagnosis present

## 2023-06-30 DIAGNOSIS — Z79899 Other long term (current) drug therapy: Secondary | ICD-10-CM | POA: Diagnosis not present

## 2023-06-30 DIAGNOSIS — Z9981 Dependence on supplemental oxygen: Secondary | ICD-10-CM

## 2023-06-30 DIAGNOSIS — R000 Tachycardia, unspecified: Secondary | ICD-10-CM | POA: Diagnosis not present

## 2023-06-30 DIAGNOSIS — I2782 Chronic pulmonary embolism: Secondary | ICD-10-CM | POA: Diagnosis present

## 2023-06-30 DIAGNOSIS — N400 Enlarged prostate without lower urinary tract symptoms: Secondary | ICD-10-CM | POA: Diagnosis present

## 2023-06-30 DIAGNOSIS — J9621 Acute and chronic respiratory failure with hypoxia: Secondary | ICD-10-CM | POA: Diagnosis present

## 2023-06-30 DIAGNOSIS — J471 Bronchiectasis with (acute) exacerbation: Secondary | ICD-10-CM | POA: Diagnosis present

## 2023-06-30 DIAGNOSIS — Z515 Encounter for palliative care: Secondary | ICD-10-CM | POA: Diagnosis not present

## 2023-06-30 DIAGNOSIS — E66811 Obesity, class 1: Secondary | ICD-10-CM | POA: Diagnosis present

## 2023-06-30 DIAGNOSIS — F419 Anxiety disorder, unspecified: Secondary | ICD-10-CM | POA: Diagnosis present

## 2023-06-30 DIAGNOSIS — J441 Chronic obstructive pulmonary disease with (acute) exacerbation: Secondary | ICD-10-CM

## 2023-06-30 DIAGNOSIS — Z79891 Long term (current) use of opiate analgesic: Secondary | ICD-10-CM

## 2023-06-30 DIAGNOSIS — I2694 Multiple subsegmental pulmonary emboli without acute cor pulmonale: Secondary | ICD-10-CM | POA: Diagnosis not present

## 2023-06-30 DIAGNOSIS — Z85118 Personal history of other malignant neoplasm of bronchus and lung: Secondary | ICD-10-CM

## 2023-06-30 DIAGNOSIS — J984 Other disorders of lung: Secondary | ICD-10-CM | POA: Diagnosis present

## 2023-06-30 DIAGNOSIS — I129 Hypertensive chronic kidney disease with stage 1 through stage 4 chronic kidney disease, or unspecified chronic kidney disease: Secondary | ICD-10-CM | POA: Diagnosis present

## 2023-06-30 DIAGNOSIS — R0902 Hypoxemia: Secondary | ICD-10-CM | POA: Diagnosis not present

## 2023-06-30 DIAGNOSIS — R1011 Right upper quadrant pain: Secondary | ICD-10-CM | POA: Diagnosis not present

## 2023-06-30 DIAGNOSIS — Z7951 Long term (current) use of inhaled steroids: Secondary | ICD-10-CM

## 2023-06-30 DIAGNOSIS — J9809 Other diseases of bronchus, not elsewhere classified: Secondary | ICD-10-CM | POA: Diagnosis present

## 2023-06-30 DIAGNOSIS — G8929 Other chronic pain: Secondary | ICD-10-CM | POA: Diagnosis present

## 2023-06-30 DIAGNOSIS — T380X5A Adverse effect of glucocorticoids and synthetic analogues, initial encounter: Secondary | ICD-10-CM | POA: Diagnosis present

## 2023-06-30 DIAGNOSIS — R0602 Shortness of breath: Secondary | ICD-10-CM | POA: Diagnosis not present

## 2023-06-30 DIAGNOSIS — J9601 Acute respiratory failure with hypoxia: Secondary | ICD-10-CM | POA: Diagnosis present

## 2023-06-30 DIAGNOSIS — Z87891 Personal history of nicotine dependence: Secondary | ICD-10-CM

## 2023-06-30 DIAGNOSIS — J90 Pleural effusion, not elsewhere classified: Secondary | ICD-10-CM | POA: Diagnosis present

## 2023-06-30 DIAGNOSIS — R0600 Dyspnea, unspecified: Secondary | ICD-10-CM | POA: Diagnosis present

## 2023-06-30 DIAGNOSIS — Z888 Allergy status to other drugs, medicaments and biological substances status: Secondary | ICD-10-CM

## 2023-06-30 DIAGNOSIS — J122 Parainfluenza virus pneumonia: Secondary | ICD-10-CM | POA: Diagnosis not present

## 2023-06-30 DIAGNOSIS — Z7952 Long term (current) use of systemic steroids: Secondary | ICD-10-CM

## 2023-06-30 DIAGNOSIS — Z743 Need for continuous supervision: Secondary | ICD-10-CM | POA: Diagnosis not present

## 2023-06-30 LAB — RESPIRATORY PANEL BY PCR

## 2023-06-30 LAB — COMPREHENSIVE METABOLIC PANEL WITH GFR
ALT: 22 U/L (ref 0–44)
AST: 26 U/L (ref 15–41)
Albumin: 3.3 g/dL — ABNORMAL LOW (ref 3.5–5.0)
Alkaline Phosphatase: 87 U/L (ref 38–126)
Anion gap: 13 (ref 5–15)
BUN: 14 mg/dL (ref 8–23)
CO2: 40 mmol/L — ABNORMAL HIGH (ref 22–32)
Calcium: 8.5 mg/dL — ABNORMAL LOW (ref 8.9–10.3)
Chloride: 86 mmol/L — ABNORMAL LOW (ref 98–111)
Creatinine, Ser: 1.19 mg/dL (ref 0.61–1.24)
GFR, Estimated: >60 mL/min (ref >60)
Glucose, Bld: 131 mg/dL — ABNORMAL HIGH (ref 70–99)
Potassium: 3.5 mmol/L (ref 3.5–5.1)
Sodium: 139 mmol/L (ref 135–145)
Total Bilirubin: 1.1 mg/dL (ref 0.0–1.2)
Total Protein: 6.7 g/dL (ref 6.5–8.1)

## 2023-06-30 LAB — TROPONIN I (HIGH SENSITIVITY): Troponin I (High Sensitivity): 26 ng/L — ABNORMAL HIGH (ref <18)

## 2023-06-30 LAB — STREP PNEUMONIAE URINARY ANTIGEN: Strep Pneumo Urinary Antigen: NEGATIVE

## 2023-06-30 LAB — CBC
HCT: 35.8 % — ABNORMAL LOW (ref 39.0–52.0)
Hemoglobin: 10.2 g/dL — ABNORMAL LOW (ref 13.0–17.0)
MCH: 26.4 pg (ref 26.0–34.0)
MCHC: 28.5 g/dL — ABNORMAL LOW (ref 30.0–36.0)
MCV: 92.5 fL (ref 80.0–100.0)
Platelets: 315 10*3/uL (ref 150–400)
RBC: 3.87 MIL/uL — ABNORMAL LOW (ref 4.22–5.81)
RDW: 16.4 % — ABNORMAL HIGH (ref 11.5–15.5)
WBC: 7.6 10*3/uL (ref 4.0–10.5)
nRBC: 0.3 % — ABNORMAL HIGH (ref 0.0–0.2)

## 2023-06-30 LAB — PROCALCITONIN: Procalcitonin: <0.1 ng/mL

## 2023-06-30 LAB — BRAIN NATRIURETIC PEPTIDE: B Natriuretic Peptide: 170 pg/mL — ABNORMAL HIGH (ref 0.0–100.0)

## 2023-06-30 MED ORDER — ORAL CARE MOUTH RINSE: 15.0000 mL | OROMUCOSAL | Status: DC | PRN

## 2023-06-30 MED ORDER — CARMEX CLASSIC LIP BALM EX OINT
TOPICAL_OINTMENT | CUTANEOUS | Status: DC | PRN
  Filled 2023-06-30: qty 10

## 2023-06-30 MED ORDER — ALPRAZOLAM 0.5 MG PO TABS
0.5000 mg | ORAL_TABLET | Freq: Once | ORAL | Status: AC | PRN
  Administered 2023-06-30: 0.5 mg via ORAL
  Filled 2023-06-30: qty 1

## 2023-06-30 MED ORDER — CHLORHEXIDINE GLUCONATE CLOTH 2 % EX PADS
6.0000 | MEDICATED_PAD | Freq: Every day | CUTANEOUS | Status: DC
  Administered 2023-06-30 – 2023-07-06 (×6): 6 via TOPICAL

## 2023-06-30 MED ORDER — ORAL CARE MOUTH RINSE
15.0000 mL | OROMUCOSAL | Status: DC
  Administered 2023-06-30 (×3): 15 mL via OROMUCOSAL

## 2023-06-30 MED ORDER — HYDROXYZINE HCL 25 MG PO TABS
25.0000 mg | ORAL_TABLET | Freq: Three times a day (TID) | ORAL | Status: DC | PRN
  Administered 2023-06-30 – 2023-07-07 (×11): 25 mg via ORAL
  Filled 2023-06-30 (×12): qty 1

## 2023-06-30 NOTE — TOC Initial Note (Signed)
 Transition of Care [facility]) - Initial/Assessment Note    Patient Details  Name: Philip [name] MRN: [id number] Date of Birth: [birth year]  [facility] CM/SW Contact:    Tessie Fila, RN Phone Number: 06/30/2023, 10:24 AM  Clinical Narrative:                 Pt is from home with spouse. Pt POC is [name] [phone]. CM spoke with pt wife and she states pt uses 6L O2 via [place] at baseline. [facility] supplies oxygen needs.Pt is under the care of [facility] and receives SN/PT/OT services. Pt will need ROC orders at DC. Pt has rollator in the home. Pt currently in ICU, not medically stable for DC. TOC will continue to follow.   Expected Discharge Plan: Home w Hospice Care Barriers to Discharge: Continued Medical Work up   Patient Goals and CMS Choice Patient states their goals for this hospitalization and ongoing recovery are:: To return home CMS Medicare.gov Compare Post Acute Care list provided to:: Other (Comment Required) (NA) Choice offered to / list presented to : NA [place] ownership interest in [facility].provided to:: Parent NA    Expected Discharge Plan and Services In-house Referral: NA Discharge Planning Services: CM Consult Post Acute Care Choice: Durable Medical Equipment, Hospice Living arrangements for the past 2 months: Single Family Home   DME Arranged: Oxygen, Walker rolling with seat DME Agency: [facility]       HH Arranged: NA HH Agency: Other - See comment Photographer)        Prior Living Arrangements/Services Living arrangements for the past 2 months: Single Family Home Lives with:: Spouse Patient language and need for interpreter reviewed:: Yes Do you feel safe going back to the place where you live?: Yes      Need for Family Participation in Patient Care: No (Comment) Care giver support system in place?: Yes (comment) Current home services: DME, Hospice Criminal Activity/Legal Involvement  Pertinent to Current Situation/Hospitalization: No - Comment as needed  Activities of Daily Living      Permission Sought/Granted Permission sought to share information with : Family Supports Permission granted to share information with : Yes, Verbal Permission Granted  Share Information with NAME: [name],[name] (Spouse)  [phone]           Emotional Assessment Appearance:: Other (Comment Required (UTA) Attitude/Demeanor/Rapport: Unable to Assess Affect (typically observed): Unable to Assess Orientation: : Oriented to Self, Oriented to Place, Oriented to  Time, Oriented to Situation Alcohol / Substance Use: Not Applicable Psych Involvement: No (comment)  Admission diagnosis:  Acute respiratory failure with hypoxia (HCC) [J96.01] Dyspnea, unspecified type [R06.00] Patient Active Problem List   Diagnosis Date Noted   Acute respiratory failure with hypoxia (HCC) 06/29/2023   Current chronic use of systemic steroids 05/24/2023   Acute pulmonary edema (HCC) 05/21/2023   Pleural effusion 05/21/2023   Radiation fibrosis of lung (HCC) 05/21/2023   Acute on chronic respiratory failure with hypoxia (HCC) 05/21/2023   Acute hypoxemic respiratory failure (HCC) 05/19/2023   Generalized edema 05/18/2023   Hypokalemia 05/17/2023   Chronic low back pain 05/17/2023   Leukocytosis 05/17/2023   Constipation 05/17/2023   Acute respiratory failure (HCC) 05/05/2023   Class I obesity 04/22/2023   COPD exacerbation (HCC) 04/19/2023   History of lung cancer 04/18/2023   Radiation-induced pulmonary fibrosis (HCC) 04/18/2023   Chronic migraine without aura 04/18/2023   Hyperlipidemia 04/18/2023   COPD with acute exacerbation (HCC) 04/18/2023  CAP (community acquired pneumonia) 01/12/2022   Tobacco abuse 01/12/2022   BPH (benign prostatic hyperplasia) 01/12/2022   GERD (gastroesophageal reflux disease) 01/12/2022   COPD, moderate (HCC) 10/14/2021   Preoperative clearance 10/14/2021    Neutropenia (HCC) 01/29/2021   Recurrent non-small cell lung cancer (HCC) 01/01/2021   Pulmonary embolism (HCC) 03/14/2020   Healthcare maintenance 10/10/2019   Allergic rhinitis 10/10/2019   COPD (chronic obstructive pulmonary disease) (HCC) 06/19/2019   Pneumonia 06/19/2019   MAI (mycobacterium avium-intracellulare) infection (HCC) 06/19/2019   Acute on chronic hypoxic respiratory failure (HCC) 06/19/2019   Chronic pulmonary embolism 06/16/2019   Encounter for antineoplastic immunotherapy 04/26/2019   Odynophagia 03/13/2019   Adenocarcinoma of right lung, stage 3 (HCC) 01/13/2019   Goals of care, counseling/discussion 01/13/2019   Encounter for antineoplastic chemotherapy 01/13/2019   Tobacco abuse counseling 01/13/2019   Pneumothorax 01/09/2019   S/P bronchoscopy with biopsy    Lung mass 12/22/2018   S/P bronchoscopy    PCP:  [name], MD Pharmacy:   [facility] [phone] - [place], [place] - [address] [place] [address] Phone: [phone] Fax: [phone]  [id number] - [place], [place] - [address] AT [facility] [address] [place] [address] Phone: [phone] Fax: [phone]  [place] [facility] [address] [place] [address] Phone: [phone] Fax: [phone]  [facility] [address] [place] [address] Phone: [phone] Fax: [phone]     Social Drivers of Health (SDOH) Social History: SDOH Screenings   Food Insecurity: Patient Unable To Answer (06/30/2023)  Housing: Patient Unable To Answer (06/30/2023)  Transportation Needs: Patient Unable To Answer (06/30/2023)  Utilities: Patient Unable To Answer (06/30/2023)  Social Connections: Patient Unable To Answer (06/30/2023)  Tobacco Use: Medium Risk (06/29/2023)   SDOH Interventions: Food Insecurity  Interventions: Patient Unable to Answer Housing Interventions: Patient Unable to Answer Utilities Interventions: Patient Unable to Answer Social Connections Interventions: Patient Unable to Answer   Readmission Risk Interventions    05/24/2023    5:05 PM 01/22/2022   11:05 AM 01/14/2022   12:03 PM  Readmission Risk Prevention Plan  Transportation Screening Complete Complete Complete  PCP or Specialist Appt within 5-7 Days  Complete Complete  PCP or Specialist Appt within 3-5 Days Complete    Home Care Screening  Complete Complete  Medication Review (RN CM)  Complete Complete  HRI or Home Care Consult Complete    Social Work Consult for Recovery Care Planning/Counseling Complete    Palliative Care Screening Not Applicable    Medication Review Oceanographer) Referral to Pharmacy

## 2023-06-30 NOTE — Plan of Care (Signed)

## 2023-06-30 NOTE — Progress Notes (Signed)
 PT Cancellation Note  Patient Details Name: Philip [name] MRN: [id number] DOB: 10-14-1955   Cancelled Treatment:    Reason Eval/Treat Not Completed: Medical issues which prohibited therapy (per RN pt is desaturating to the 70s in bed and is not yet ready for PT, will follow.)   Daymon Evans PT 06/30/2023  Acute Rehabilitation Services  Office [phone]

## 2023-06-30 NOTE — Telephone Encounter (Signed)
 Does Dr. Marvina Slough still have this FMLA form. Given back to him yesterday?  Ins [employer] Needs it. Thanks.

## 2023-06-30 NOTE — Progress Notes (Signed)
   06/29/23 2200  BiPAP/CPAP/SIPAP  BiPAP/CPAP/SIPAP Pt Type Adult  BiPAP/CPAP/SIPAP V60  Mask Type Full face mask  Set Rate 18 breaths/min  Respiratory Rate 18 breaths/min  Flow Rate 0 lpm  Patient Home Machine No  Patient Home Mask No  Patient Home Tubing No  Auto Titrate No  Press High Alarm 35 cmH2O  Press Low Alarm 5 cmH2O  Device Plugged into RED Power Outlet Yes  Oxygen Percent 75 %  BiPAP/CPAP /SiPAP Vitals  Pulse Rate (!) 112  Resp (!) 26  BP 117/70  SpO2 98 %  MEWS Score/Color  MEWS Score 4  MEWS Score Color Red   Started Bipap with patient. Settings are 09/06/73% RR 18. He was educated on the uses of bipap and the modes with the mask fitting for a better understanding. On assessment he looked comfortable VS were stable and he had no complaints.

## 2023-06-30 NOTE — Progress Notes (Signed)
 Based upon MEWS score and comfort care status, I attempted to visit with Philip [name]. At time of visit, Philip [name] was sleeping or not alert and no family present.  Will attempt to offer support later. Please page a chaplain if desired.  Shantara Goosby L. Minetta Aly, M.Div [phone]

## 2023-06-30 NOTE — Progress Notes (Signed)
 PROGRESS NOTE    Philip [name]  GMW:[id number] DOB: May 15, 1955 DOA: 06/29/2023 PCP: [name], MD    Brief Narrative:  Philip [name] is a 68 y.o. 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  History is provided by the patient, as well as his sister who is at the bedside.  Chronically he is on 5 L nasal cannula oxygen, has titrated up as high as 10 L/min with any kind of exertion, even trying to sit up straight in a chair.  Been this way for many months.  He was on chemotherapy under the care of Dr. Liam Redhead, however carboplatin  was stopped after second cycle given anaphylaxis, and documentation indicates that no further chemotherapy is planned.     Assessment and Plan: Acute on chronic hypoxic respiratory failure-he has COPD with chronic hypoxic respiratory failure as well as history of pulmonary MAI infection and known right sided pleural effusion.  He has increased sputum production and persistent cough which are atypical for him, but otherwise no significant change in his symptomatology.---- found to have parainfluenza virus -Continue supplemental oxygen, with goal O2 saturation 88 to 90% -Continue nebulized LAMA, LABA, ICS, prednisone  10 mg p.o. daily - Lasix  60 mg IV x 1 and then PO -on zosyn/vanc- procal negative-- defer to pulm- sputum culture pending - pulmonology consultation for further recommendations -wean O2 as able-- RT to try HF [place]   Stage III lung adenocarcinoma-under the care of Dr. [name], no longer on treatment- added to treatment team -Norco as needed for pain   History of PE -continue Xarelto  indefinitely in the setting of malignancy   BPH -Flomax      DVT prophylaxis:   rivaroxaban  (XARELTO ) tablet 20 mg    Code Status: Limited: Do not attempt resuscitation (DNR) -DNR-LIMITED -Do Not Intubate/DNI  Family Communication:   Disposition Plan:  Level of care: Stepdown Status is: Inpatient   Consultants:  pccm   Subjective:   Objective: Vitals:   06/30/23 0935 06/30/23 1000 06/30/23 1100 06/30/23 1200  BP:  119/67 113/82 102/69  Pulse: (!) 121 (!) 119 (!) 119 (!) 117  Resp: (!) 24 (!) 21 (!) 28 (!) 35  Temp:    98.9 F (37.2 C)  TempSrc:    Oral  SpO2: 94% 95% 98% 98%  Weight:      Height:        Intake/Output Summary (Last 24 hours) at 06/30/2023 1225 Last data filed at 06/30/2023 1121 Gross per 24 hour  Intake 930 ml  Output 4050 ml  Net -3120 ml   Filed Weights   06/29/23 1149 06/29/23 1635  Weight: 90 kg 90.1 kg    Examination:   General: Appearance:    Obese male in no acute distress     Lungs:    respirations unlabored, diminished  Heart:    Tachycardic.    MS:   All extremities are intact.    Neurologic:   Awake, alert       Data Reviewed: I have personally reviewed following labs and imaging studies  CBC: Recent Labs  Lab 06/29/23 0903 06/30/23 0300  WBC 9.0 7.6  NEUTROABS 7.8*  --   HGB 9.6* 10.2*  HCT 32.1* 35.8*  MCV 87.0 92.5  PLT 331 315   Basic Metabolic Panel: Recent Labs  Lab 06/29/23 0903 06/30/23 0300  NA 139 139  K 4.8 3.5  CL 94* 86*  CO2 40* 40*  GLUCOSE 136* 131*  BUN 18 14  CREATININE 0.96 1.19  CALCIUM  9.4 8.5*   GFR: Estimated Creatinine Clearance: 63.3 mL/min (by C-G formula based on SCr of 1.19 mg/dL). Liver Function Tests: Recent Labs  Lab 06/29/23 0903 06/30/23 0300  AST 20 26  ALT 20 22  ALKPHOS 86 87  BILITOT 0.4 1.1  PROT 6.6 6.7  ALBUMIN  3.7 3.3*   No results for input(s): "LIPASE", "AMYLASE" in the last 168 hours. No results for input(s): "AMMONIA" in the last 168 hours. Coagulation Profile: No results for input(s): "INR", "PROTIME" in the last 168  hours. Cardiac Enzymes: No results for input(s): "CKTOTAL", "CKMB", "CKMBINDEX", "TROPONINI" in the last 168 hours. BNP (last 3 results) No results for input(s): "PROBNP" in the last 8760 hours. HbA1C: No results for input(s): "HGBA1C" in the last 72 hours. CBG: No results for input(s): "GLUCAP" in the last 168 hours. Lipid Profile: No results for input(s): "CHOL", "HDL", "LDLCALC", "TRIG", "CHOLHDL", "LDLDIRECT" in the last 72 hours. Thyroid  Function Tests: No results for input(s): "TSH", "T4TOTAL", "FREET4", "T3FREE", "THYROIDAB" in the last 72 hours. Anemia Panel: No results for input(s): "VITAMINB12", "FOLATE", "FERRITIN", "TIBC", "IRON", "RETICCTPCT" in the last 72 hours. Sepsis Labs: Recent Labs  Lab 06/30/23 0300  PROCALCITON <0.10    Recent Results (from the past 240 hours)  Expectorated Sputum Assessment w Gram Stain, Rflx to Resp Cult     Status: None   Collection Time: 06/29/23  4:34 PM   Specimen: Expectorated Sputum  Result Value Ref Range Status   Specimen Description EXPECTORATED SPUTUM  Final   Special Requests NONE  Final   Sputum evaluation   Final    THIS SPECIMEN IS ACCEPTABLE FOR SPUTUM CULTURE Performed at [facility], [address], [place], [place] [address]    Report Status 06/29/2023 FINAL  Final  Culture, Respiratory w Gram Stain     Status: None (Preliminary result)   Collection Time: 06/29/23  4:34 PM  Result Value Ref Range Status   Specimen Description   Final    EXPECTORATED SPUTUM Performed at [facility], [address], [place], [place] [address]    Special Requests   Final    NONE Reflexed from [id number] Performed at [facility], [address], [place], [place] [address]    Gram Stain   Final    ABUNDANT WBC PRESENT, PREDOMINANTLY PMN ABUNDANT GRAM NEGATIVE RODS RARE GRAM POSITIVE COCCI    Culture   Final    TOO YOUNG TO READ Performed at [facility], [address], [place], [place] [address]    Report Status PENDING  Incomplete  Respiratory (~20 pathogens) panel by PCR     Status: Abnormal   Collection Time: 06/29/23  8:40 PM   Specimen: Nasopharyngeal Swab; Respiratory  Result Value Ref Range Status   Adenovirus NOT DETECTED NOT DETECTED Final   Coronavirus 229E NOT DETECTED NOT DETECTED Final    Comment: (NOTE) The Coronavirus on the Respiratory Panel, DOES NOT test for the novel  Coronavirus (2019 nCoV)    Coronavirus HKU1 NOT DETECTED NOT DETECTED Final   Coronavirus NL63 NOT DETECTED NOT DETECTED Final   Coronavirus OC43 NOT DETECTED NOT DETECTED Final   Metapneumovirus NOT DETECTED NOT DETECTED Final  Rhinovirus / Enterovirus NOT DETECTED NOT DETECTED Final   Influenza A NOT DETECTED NOT DETECTED Final   Influenza B NOT DETECTED NOT DETECTED Final   Parainfluenza Virus 1 NOT DETECTED NOT DETECTED Final   Parainfluenza Virus 2 NOT DETECTED NOT DETECTED Final   Parainfluenza Virus 3 DETECTED (A) NOT DETECTED Final   Parainfluenza Virus 4 NOT DETECTED NOT DETECTED Final   Respiratory Syncytial Virus NOT DETECTED NOT DETECTED Final   Bordetella pertussis NOT DETECTED NOT DETECTED Final   Bordetella Parapertussis NOT DETECTED NOT DETECTED Final   Chlamydophila pneumoniae NOT DETECTED NOT DETECTED Final   Mycoplasma pneumoniae NOT DETECTED NOT DETECTED Final    Comment: Performed at [facility], [address] [place], [place] [address]  MRSA Next Gen by PCR, Nasal     Status: None   Collection Time: 06/29/23  8:40 PM   Specimen: Nasal Mucosa; Nasal Swab  Result Value Ref Range Status   MRSA by PCR Next Gen NOT DETECTED NOT DETECTED Final    Comment: (NOTE) The GeneXpert MRSA Assay (FDA approved for NASAL specimens only), is one component of a comprehensive MRSA colonization surveillance program. It is not intended to diagnose MRSA infection nor to guide or monitor treatment for MRSA infections. Test performance is not FDA  approved in patients less than 22 years old. Performed at [facility], [address] [place], [place] [address]          Radiology Studies: [facility] Chest Port 1 View Result Date: 06/29/2023 CLINICAL DATA:  sob EXAM: PORTABLE CHEST - 1 VIEW COMPARISON:  05/24/2023 FINDINGS: Perihilar scarring right greater than left, slightly increased. Stable moderate right pleural effusion with lateral thickening or loculation. Heart size and within normal limits. Visualized bones unremarkable. Interval PICC line removal. IMPRESSION: 1. Slight increase in perihilar scarring. 2. Stable right pleural effusion. Electronically Signed   By: Nicoletta Barrier M.D.   On: 06/29/2023 13:16   CT CHEST ABDOMEN PELVIS W CONTRAST Result Date: 06/29/2023 CLINICAL DATA:  Non-small cell lung carcinoma, staging post treatment. Cough, shortness of breath EXAM: CT CHEST, ABDOMEN, AND PELVIS WITH CONTRAST TECHNIQUE: Multidetector CT imaging of the chest, abdomen and pelvis was performed following the standard protocol during bolus administration of intravenous contrast. RADIATION DOSE REDUCTION: This exam was performed according to the departmental dose-optimization program which includes automated exposure control, adjustment of the mA and/or kV according to patient size and/or use of iterative reconstruction technique. CONTRAST:  OMNIPAQUE  IOHEXOL  300 MG/ML  SOLN COMPARISON:  05/25/2023 and previous FINDINGS: CT CHEST FINDINGS Cardiovascular: Heart size normal. Trace pericardial fluid. 3-vessel coronary calcifications. Minimal calcified aortic plaque. Mediastinum/Nodes: Subcentimeter anterior mediastinal and subcarinal lymph nodes. No hilar adenopathy. Lungs/Pleura: Persistent small moderate right pleural effusion with mild smooth pleural thickening at the base. No pneumothorax. Advanced emphysema. Stable pericardial scarring/fibrosis right worse than left. No new infiltrate. Musculoskeletal: Vertebral endplate  spurring at multiple levels in the lower thoracic spine. CT ABDOMEN PELVIS FINDINGS Hepatobiliary: No focal liver abnormality is seen. No gallstones, gallbladder wall thickening, or biliary dilatation. Pancreas: Unremarkable. No pancreatic ductal dilatation or surrounding inflammatory changes. Spleen: Normal in size without focal abnormality. Adrenals/Urinary Tract: 3 cm left adrenal adenoma, previously 2.4 cm on 06/16/2019. Symmetric renal parenchymal enhancement, without focal lesion. Right double-J ureteral stent in expected location, without hydronephrosis. Urinary bladder partially distended. Stomach/Bowel: Stomach is nondistended. Small bowel decompressed. Appendix not localized. The colon is partially distended, without acute finding. Vascular/Lymphatic: Scattered aortoiliac calcified atheromatous plaque.  Portal vein patent. No abdominal or pelvic adenopathy. Reproductive: Mild prostate enlargement. Other: Pelvic phleboliths.  No ascites.  No free air. Musculoskeletal: Degenerative disc disease and facet DJD L5-S1. IMPRESSION: 1. Persistent small to moderate right pleural effusion with mild smooth pleural thickening at the base. 2. No new or progressive findings. 3. Right double-J ureteral stent in expected location, without hydronephrosis. 4. Aortic Atherosclerosis (ICD10-I70.0) and Emphysema (ICD10-J43.9). Electronically Signed   By: Nicoletta Barrier M.D.   On: 06/29/2023 13:15        Scheduled Meds:  arformoterol   15 mcg Nebulization BID   budesonide   0.5 mg Nebulization BID   Chlorhexidine  Gluconate Cloth  6 each Topical Daily   furosemide   40 mg Oral Daily   magnesium  oxide  400 mg Oral Daily   mouth rinse  15 mL Mouth Rinse 4 times per day   pantoprazole   40 mg Oral Daily   predniSONE   10 mg Oral Q breakfast   revefenacin   175 mcg Nebulization Daily   rivaroxaban   20 mg Oral Q supper   tamsulosin   0.4 mg Oral QHS   Continuous Infusions:  piperacillin-tazobactam (ZOSYN)  IV 3.375 g  (06/30/23 1142)   vancomycin        LOS: 0 days    Time spent: 45 minutes spent on chart review, discussion with nursing staff, consultants, updating family and interview/physical exam; more than 50% of that time was spent in counseling and/or coordination of care.    Enrigue Harvard, DO [facility] Available via Epic secure chat 7am-7pm After these hours, please refer to coverage provider listed on [email or web address] 06/30/2023, 12:25 PM

## 2023-06-30 NOTE — Progress Notes (Signed)
     Patient Name: Philip [name]           DOB: May 25, 1955  MRN: [id number]      Admission Date: 06/29/2023  Attending Provider: Enrigue Harvard, DO  Primary Diagnosis: Acute respiratory failure with hypoxia (HCC)   Level of care: Stepdown   OVERNIGHT PROGRESS REPORT  Notified by bedside RN that patient is requesting CODE STATUS change.  Patient is currently DNR/DNI and is requesting to be Full code. His 3 daughters are at bedside.  At the family's and patient's request, I promptly arrived to speak with the patient regarding his code status.  Philip [name] is alert and oriented to person, place, time, and situation. He demonstrates a clear understanding of his current illness and overall health condition.  We engaged in a thorough conversation regarding the differences between Full Code and DNR/DNI. Philip [name] expressed understanding that while aggressive interventions-such as CPR, defibrillation, and intubation-can potentially prolong life in emergent situations, they also carry significant risks, including the possibility of adverse outcomes. He acknowledges that these interventions do not guarantee a return to baseline functional status or survival, and may negatively impact his quality of life.  Philip [name] stated, "I want to take a chance and do everything possible." He is aware that, due to his respiratory failure, lung cancer, and multiple comorbidities, there is a possibility he may not be able to be weaned off mechanical ventilation if intubated.  He has requested his code status be changed to FULL code.  His daughters are supportive of his decision and demonstrate a realistic understanding of his clinical condition. RN is aware of code status change.    Malvina Schadler, DNP, ACNPC- AG [facility] [place]

## 2023-06-30 NOTE — Telephone Encounter (Signed)
 Paperwork resubmitted for completion on 5/27. Waiting for Dr.'s signature.

## 2023-06-30 NOTE — Progress Notes (Addendum)
 Philip [name]   DOB:1956-01-19   NW#:[id number]      ASSESSMENT & PLAN:  Philip [name] is a 68 year old male patient with oncologic history significant for non-small cell lung cancer.  He is status post chemotherapy.  Currently on observation.  Admitted on 06/29/2023 with complaints of shortness of breath.  Non-small cell lung cancer -Initially diagnosed October 2020 with disease recurrence in October 2022 - Status post concurrent chemo radiation with carbo and paclitaxel , first dose 1228 2022 x 6 cycles.  Subsequently received immunotherapy with Imfinzi  x 13 cycles. - Due to disease progression in 2022 was given chemotherapy with carboplatin , Alimta , and Keytruda .  Treatment has subsequently been held since 04/02/2022 due to other comorbidities.  Patient has been on observation only.  Last seen in outpatient oncology office on 04/21/2023. - Medical oncology/Dr. [name] following closely  Shortness of breath Acute on chronic hypoxic respiratory failure COPD - Continue supportive care with oxygen as ordered - Status post diuresis with Lasix  - Appreciate critical care and pulmonary teams  Acute on chronic pulmonary embolism - Diagnosed February 2022 - Started on Xarelto  in 2022 at 20 mg p.o. daily  Anemia - Hemoglobin 10.2 - No transfusional intervention required at this time - Continue to monitor CBC with differential   Code Status DNR-limited  Subjective:  Patient seen awake and alert laying in bed.  Chronically ill-appearing.  High flow nasal cannula intact.  Denies pain.  Admits to ongoing shortness of breath.  No other acute complaints.  Objective:   Intake/Output Summary (Last 24 hours) at 06/30/2023 1004 Last data filed at 06/30/2023 6578 Gross per 24 hour  Intake 570 ml  Output 1900 ml  Net -1330 ml     PHYSICAL EXAMINATION: ECOG PERFORMANCE STATUS: 3 - Symptomatic, >50% confined to bed  Vitals:   06/30/23 0935 06/30/23 1000  BP:    Pulse: (!) 121 (!) 119   Resp: (!) 24 (!) 21  Temp:    SpO2: 94% 95%   Filed Weights   06/29/23 1149 06/29/23 1635  Weight: 198 lb 6.4 oz (90 kg) 198 lb 10.2 oz (90.1 kg)    GENERAL: alert, +mild distress +chronically ill-appearing SKIN: skin color, texture, turgor are normal, no rashes or significant lesions EYES: normal, conjunctiva are pink and non-injected, sclera clear OROPHARYNX: no exudate, no erythema and lips, buccal mucosa, and tongue normal  NECK: supple, thyroid  normal size, non-tender, without nodularity LYMPH: no palpable lymphadenopathy in the cervical, axillary or inguinal LUNGS: +Coarse to auscultation +HFNC intact  HEART: regular rate & rhythm and no murmurs and no lower extremity edema ABDOMEN: abdomen soft, non-tender and normal bowel sounds MUSCULOSKELETAL: no cyanosis of digits and no clubbing  PSYCH: alert & oriented x 3 with fluent speech NEURO: no focal motor/sensory deficits   All questions were answered. The patient knows to call the clinic with any problems, questions or concerns.   The total time spent in the appointment was 40 minutes encounter with patient including review of chart and various tests results, discussions about plan of care and coordination of care plan  Jacqualin Mate, NP 06/30/2023 10:04 AM    Labs Reviewed:  Lab Results  Component Value Date   WBC 7.6 06/30/2023   HGB 10.2 (L) 06/30/2023   HCT 35.8 (L) 06/30/2023   MCV 92.5 06/30/2023   PLT 315 06/30/2023   Recent Labs    05/21/23 1000 05/22/23 0430 05/28/23 0540 06/29/23 0903 06/30/23 0300  NA 135   < > 138  139 139  K 4.4   < > 4.0 4.8 3.5  CL 94*   < > 99 94* 86*  CO2 31   < > 36* 40* 40*  GLUCOSE 187*   < > 148* 136* 131*  BUN 18   < > 20 18 14   CREATININE 0.90   < > 0.86 0.96 1.19  CALCIUM  8.8*   < > 8.0* 9.4 8.5*  GFRNONAA >60   < > >60 >60 >60  PROT 5.8*  --   --  6.6 6.7  ALBUMIN  2.7*   < > 2.4* 3.7 3.3*  AST 21  --   --  20 26  ALT 25  --   --  20 22  ALKPHOS 68  --   --  86  87  BILITOT 0.4  --   --  0.4 1.1   < > = values in this interval not displayed.    Studies Reviewed:  DG Chest Port 1 View Result Date: 06/29/2023 CLINICAL DATA:  sob EXAM: PORTABLE CHEST - 1 VIEW COMPARISON:  05/24/2023 FINDINGS: Perihilar scarring right greater than left, slightly increased. Stable moderate right pleural effusion with lateral thickening or loculation. Heart size and within normal limits. Visualized bones unremarkable. Interval PICC line removal. IMPRESSION: 1. Slight increase in perihilar scarring. 2. Stable right pleural effusion. Electronically Signed   By: Nicoletta Barrier M.D.   On: 06/29/2023 13:16   CT CHEST ABDOMEN PELVIS W CONTRAST Result Date: 06/29/2023 CLINICAL DATA:  Non-small cell lung carcinoma, staging post treatment. Cough, shortness of breath EXAM: CT CHEST, ABDOMEN, AND PELVIS WITH CONTRAST TECHNIQUE: Multidetector CT imaging of the chest, abdomen and pelvis was performed following the standard protocol during bolus administration of intravenous contrast. RADIATION DOSE REDUCTION: This exam was performed according to the departmental dose-optimization program which includes automated exposure control, adjustment of the mA and/or kV according to patient size and/or use of iterative reconstruction technique. CONTRAST:  OMNIPAQUE  IOHEXOL  300 MG/ML  SOLN COMPARISON:  05/25/2023 and previous FINDINGS: CT CHEST FINDINGS Cardiovascular: Heart size normal. Trace pericardial fluid. 3-vessel coronary calcifications. Minimal calcified aortic plaque. Mediastinum/Nodes: Subcentimeter anterior mediastinal and subcarinal lymph nodes. No hilar adenopathy. Lungs/Pleura: Persistent small moderate right pleural effusion with mild smooth pleural thickening at the base. No pneumothorax. Advanced emphysema. Stable pericardial scarring/fibrosis right worse than left. No new infiltrate. Musculoskeletal: Vertebral endplate spurring at multiple levels in the lower thoracic spine. CT ABDOMEN  PELVIS FINDINGS Hepatobiliary: No focal liver abnormality is seen. No gallstones, gallbladder wall thickening, or biliary dilatation. Pancreas: Unremarkable. No pancreatic ductal dilatation or surrounding inflammatory changes. Spleen: Normal in size without focal abnormality. Adrenals/Urinary Tract: 3 cm left adrenal adenoma, previously 2.4 cm on 06/16/2019. Symmetric renal parenchymal enhancement, without focal lesion. Right double-J ureteral stent in expected location, without hydronephrosis. Urinary bladder partially distended. Stomach/Bowel: Stomach is nondistended. Small bowel decompressed. Appendix not localized. The colon is partially distended, without acute finding. Vascular/Lymphatic: Scattered aortoiliac calcified atheromatous plaque. Portal vein patent. No abdominal or pelvic adenopathy. Reproductive: Mild prostate enlargement. Other: Pelvic phleboliths.  No ascites.  No free air. Musculoskeletal: Degenerative disc disease and facet DJD L5-S1. IMPRESSION: 1. Persistent small to moderate right pleural effusion with mild smooth pleural thickening at the base. 2. No new or progressive findings. 3. Right double-J ureteral stent in expected location, without hydronephrosis. 4. Aortic Atherosclerosis (ICD10-I70.0) and Emphysema (ICD10-J43.9). Electronically Signed   By: Nicoletta Barrier M.D.   On: 06/29/2023 13:15   ADDENDUM: Hematology/Oncology Attending: The  patient is seen and examined today.  His 2 daughters were at the bedside.  I reviewed his record, lab, scan and agree with the above note.  This is a very pleasant 68 years old white male with recurrent non-small cell lung cancer that was initially diagnosed as stage IIIc adenocarcinoma in December 2020 and has evidence for disease recurrence in October 2022.  The patient also has a history of pulmonary embolism as well as significant and progressive COPD.  He was treated in the past with concurrent chemoradiation followed by consolidation immunotherapy  followed by systemic chemotherapy again on disease recurrence including carboplatin , Alimta  and Keytruda  for 4 cycles followed by maintenance treatment with single agent Alimta  until February 2024 when he has progressive decline of his general condition.  He has been on observation since that time and he is feeling fine except for the recurrent hospital admission with recurrent pneumonia.  He had repeat CT scan of the chest, abdomen and pelvis performed recently for restaging of his disease and that showed the persistent small to moderate right pleural effusion with no new or progressive findings.  The patient was complaining with worsening dyspnea and he was admitted to the hospital initially for suspicious pneumonia.  Respiratory virus panel was positive for parainfluenza virus 3.  The patient started initially treatment with Zosyn but this was discontinued.  He is currently on conservative management and supportive treatment for his viral infection. He is feeling a little bit better today but continues to have the baseline shortness of breath and requiring oxygen with nasal cannula.  I discussed with the patient his scan results and no evidence for disease progression.  I will arrange for him to have repeat CT scan of the chest, abdomen and pelvis in 3-4 months after discharge for restaging of his disease. Thank you for taking good care of Mr. Farquhar.  Please call if you have any questions. Disclaimer: This note was dictated with voice recognition software. Similar sounding words can inadvertently be transcribed and may be missed upon review. Aurelio Blower, MD

## 2023-06-30 NOTE — Evaluation (Signed)
 Physical Therapy Evaluation Patient Details Name: Philip [name] MRN: [id number] DOB: 1955/08/03 Today's Date: 06/30/2023  History of Present Illness  68 y.o. male being admitted to the hospital with acute on chronic hypoxic respiratory failure. 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  Clinical Impression  Pt admitted with above diagnosis. Pt sat at edge of bed x 7 minutes, SpO2 86%  on 50L HHFNC and HR 132 while sitting. Applied 15L NRB mask, SpO2 came up to 91%. Pt stood EOB and took 2 lateral steps towards head of bed. He was then fatigued and returned to bed. He reports he's been walking very short distances at home and using a WC primarily for mobility.  Pt currently with functional limitations due to the deficits listed below (see PT Problem List). Pt will benefit from acute skilled PT to increase their independence and safety with mobility to allow discharge.           If plan is discharge home, recommend the following: A little help with walking and/or transfers;A little help with bathing/dressing/bathroom;Assistance with cooking/housework;Assist for transportation;Help with stairs or ramp for entrance   Can travel by private vehicle        Equipment Recommendations None recommended by PT  Recommendations for Other Services       Functional Status Assessment Patient has had a recent decline in their functional status and demonstrates the ability to make significant improvements in function in a reasonable and predictable amount of time.     Precautions / Restrictions Precautions Precautions: Fall;Other (comment) Recall of Precautions/Restrictions: Intact Precaution/Restrictions Comments: monitor O2 Restrictions Weight Bearing Restrictions Per Provider Order: No       Mobility  Bed Mobility Overal bed mobility: Modified Independent             General bed mobility comments: HOB up, used rail for supine to sit, no assist needed for sit to supine. Pt sat EOB x 7 minutes    Transfers Overall transfer level: Needs assistance Equipment used: 1 person hand held assist Transfers: Sit to/from Stand Sit to Stand: Min assist           General transfer comment: stood at edge of bed and took 2 lateral steps towards HOB; SpO2 95% on 50L HHFNC at rest, 86% with supine to sit on 50L HHFNC, HR 132 sitting EOB; applied 15L non rebreather mask while sitting which brough SpO2 up to 91%    Ambulation/Gait Ambulation/Gait assistance: Contact guard assist Gait Distance (Feet): 2 Feet Assistive device: 1 person hand held assist Gait Pattern/deviations: Step-to pattern       General Gait Details: 2 side steps at edge of bed  Stairs            Wheelchair Mobility     Tilt Bed    Modified Rankin (Stroke Patients Only)       Balance Overall balance assessment: Needs assistance   Sitting balance-Leahy Scale: Good       Standing balance-Leahy Scale: Fair                               Pertinent Vitals/Pain Pain Assessment Pain Assessment: No/denies pain    Home Living Family/patient expects to be discharged to:: Private residence Living Arrangements: Spouse/significant  other Available Help at Discharge: Family;Available 24 hours/day Type of Home: House Home Access: Ramped entrance       Home Layout: One level Home Equipment: Shower seat;Grab bars - tub/shower;Rolling Walker (2 wheels);Wheelchair - manual Additional Comments: 5L at rest, 10L with activity at home    Prior Function               Mobility Comments: walked only short distances, uses WC, limited by respiratory status; denies falls in past 6 months       Extremity/Trunk Assessment   Upper Extremity Assessment Upper Extremity Assessment:  Overall WFL for tasks assessed    Lower Extremity Assessment Lower Extremity Assessment: Overall WFL for tasks assessed;LLE deficits/detail;RLE deficits/detail RLE Sensation: decreased light touch LLE Sensation: decreased light touch    Cervical / Trunk Assessment Cervical / Trunk Assessment: Normal  Communication   Communication Communication: No apparent difficulties    Cognition Arousal: Alert Behavior During Therapy: WFL for tasks assessed/performed   PT - Cognitive impairments: No apparent impairments                         Following commands: Intact       Cueing       General Comments      Exercises     Assessment/Plan    PT Assessment Patient needs continued PT services  PT Problem List Cardiopulmonary status limiting activity;Decreased activity tolerance;Decreased balance;Decreased mobility       PT Treatment Interventions Gait training;Therapeutic activities;Therapeutic exercise;Functional mobility training;Patient/family education    PT Goals (Current goals can be found in the Care Plan section)  Acute Rehab PT Goals Patient Stated Goal: to walk PT Goal Formulation: With patient Time For Goal Achievement: 07/14/23 Potential to Achieve Goals: Good    Frequency Min 3X/week     Co-evaluation               AM-PAC PT "6 Clicks" Mobility  Outcome Measure Help needed turning from your back to your side while in a flat bed without using bedrails?: None Help needed moving from lying on your back to sitting on the side of a flat bed without using bedrails?: A Little Help needed moving to and from a bed to a chair (including a wheelchair)?: A Little Help needed standing up from a chair using your arms (e.g., wheelchair or bedside chair)?: A Little Help needed to walk in hospital room?: A Lot Help needed climbing 3-5 steps with a railing? : Total 6 Click Score: 16    End of Session Equipment Utilized During Treatment: Oxygen Activity  Tolerance: Treatment limited secondary to medical complications (Comment) (elevated HR, hypoxia) Patient left: in bed;with call bell/phone within reach;with nursing/sitter in room Nurse Communication: Mobility status PT Visit Diagnosis: Difficulty in walking, not elsewhere classified (R26.2)    Time: 1610-9604 PT Time Calculation (min) (ACUTE ONLY): 14 min   Charges:   PT Evaluation $PT Eval Moderate Complexity: 1 Mod   PT General Charges $$ ACUTE PT VISIT: 1 Visit        Daymon Evans PT 06/30/2023  Acute Rehabilitation Services  Office [phone]

## 2023-06-30 NOTE — Progress Notes (Addendum)
 NAME:  Philip [name], MRN:  [id number], DOB:  [birth year], LOS: 0 ADMISSION DATE:  06/29/2023, CONSULTATION DATE:  06/29/2023 REFERRING MD: Jannette Mend - TR[facility], CHIEF COMPLAINT:  Dyspnea   History of Present Illness:  Philip [name] is a 68 y.o. 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 He is followed by Dr. Marygrace Snellen in the clinic.  He was getting a routine surveillance CT of the abdomen and pelvis on 5/27 and while at the facility, he had significant dyspnea which prompted ED evaluation. In ED, he was evaluated and admitted by the hospital service and PCCM was called in consultation for assistance with his pulmonary care.  To the pulmonary critical care team the sister [name] and the patient expressed significant fear of dying, anxiety about dying, class IV dyspnea at baseline.  Requesting anxiolytic medications.  Also chronic pain on opioids.  They feel like they want relief from the symptoms in order to help her quality of life.  He is willing to try BiPAP but reports that in the past at [facility] when BiPAP was tried proper instructions were not given and is frustrated by that patient experience.  Pertinent Medical History:   Past Medical History:  Diagnosis Date   Chronic kidney disease     COPD (chronic obstructive pulmonary disease) (HCC)    Dyspnea    GERD (gastroesophageal reflux disease)    Headache    migraines   Hyperlipidemia    lung ca dx'd 12/2018   On home oxygen therapy    3 to 10 L   Pneumonia    Pulmonary embolus (HCC)    Tobacco abuse    Significant Hospital Events: Including procedures, antibiotic start and stop dates in addition to other pertinent events   5/27 - Admitted.  Interim History / Subjective:  Tolerated BiPAP for the majority of the night, woke up once with some mask-associated anxiety, better with some medications Persistent dyspnea despite escalating O2 Currently on HHFNC 50L, FiO2 75% Persistent R effusion on CXR, bedside POCUS unfortunately did not reveal safe fluid pocket for tap  Objective:  Blood pressure (!) 132/111, pulse (!) 121, temperature 98.8 F (37.1 C), temperature source Axillary, resp. rate (!) 24, height 5\' 6"  (1.676 m), weight 90.1 kg, SpO2 94%.    FiO2 (%):  [60 %-75 %] 75 %   Intake/Output Summary (Last 24 hours) at 06/30/2023 0952 Last data filed at 06/30/2023 4098 Gross per 24 hour  Intake 570 ml  Output 1900 ml  Net -1330 ml   Filed Weights   06/29/23 1149 06/29/23 1635  Weight: 90 kg 90.1 kg   Physical Examination: General: Acute-on-chronically ill-appearing middle-aged man in NAD. Appears uncomfortable, dyspneic. HEENT: [place]/AT, anicteric sclera, PERRL, moist mucous membranes. Neuro: Awake, oriented x 4. Responds to verbal stimuli. Following commands consistently. Moves all 4 extremities spontaneously. Mild generalized weakness. CV: Tachycardic,  regular rhythm, no m/g/r. PULM: Breathing tachypneic to 20s-30s, and mildly labored on HHFNC 50L/FiO2 75%. Lung fields diminished throughout, R > L. GI: Soft, nontender, mildly distended. Hypoactive bowel sounds. Extremities: Bilateral symmetric 1+ pitting LE edema noted. Skin: Warm/dry, no rashes.  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, in remission Hx pulmonary MAI infection, last evaluated in 2020 and not previously treated Recent Pseudomonas pneumonia s/p treatment Chronic small right pleural effusion, bedside POCUS 5/28 without fluid amenable to tap Chronic cor pulmonale, group 3 Tracheobronchial malacia Acute on chronic pulmonary edema Chronic pain Anxiety DNR/DNI  - Supplemental O2 support to maintain sat > 88% - BiPAP QHS and PRN - Wean FiO2 for O2 sat > 90% - RVP +parainfluenza - Urine strep negative, legionella pending - Continue steroids - Triple therapy as ordered (Brovana /Yupelri , Pulmicort ) + DuoNebs PRN - Diuresis as tolerated, monitor I&Os and renal indices - Suspect trop leak 2/2 demand, BNP stable, don't feel this is HF - Pulmonary hygiene - Encourage mobilization/OOB versus bed exercises, PT/OT - Continue empiric HCAP coverage (Zosyn starting 5/27, can d/c vanc as MRSA PCR negative) - Anxiety/dyspnea palliation with morphine , benzos PRN - Palliative relief of anxiety and dyspnea - [name] remains DNR/DNI at present; if no significant improvement in the next 24-48H he may benefit from a PMT consultation as we are not making much headway in terms of O2 weaning and are in fact going up in terms of O2 support needs; would talk to [name]/his family about this prior to placing consult and revisit DNI status to ensure these are still his wishes  Best practice (evaluated daily):  Per Primary Team  Signature:   Star East, [name] [facility] 06/30/23 9:52 AM  Please see [email or web address] for pager details.  From  7A-7P if no response, please call [phone] After hours, please call ELink [phone]

## 2023-06-30 NOTE — Progress Notes (Signed)
 Philip [name] 1228 - [facility] Liaison Note                 This patient is enrolled in the [facility] program of [facility]. He is receiving SN/PT/OT services currently.  At discharge, please order resumption of care for these services if appropriate.    Hospital Liaison Team will follow for disposition.   Please reach out if there are questions or concerns.   Madelene Schanz, BSN, RN, [facility]  [phone]

## 2023-06-30 NOTE — Progress Notes (Signed)
 eLink Physician-Brief Progress Note Patient Name: KEYLOR RANDS DOB: Jan 30, 1956 MRN: [id number]   Date of Service  06/30/2023  HPI/Events of Note  Unable to tolerate BIPAP. Hard time catching breath. Really being driven by anxiety. Has already received PRN morphine  & 1x dose of xanax given.  O2 sats 70'son HFNC 12L  He was given morphine  5 mg (not due) and xanax 0.25 mg which allowed him to sleep for a good 5 hours  Seen restless on HF face mask Denies pain  eICU Interventions  Ordered Xanax 0.5 mg x 1 prn for anxiety Discussed with BSRN     Intervention Category Minor Interventions: Agitation / anxiety - evaluation and management  Bethann Brooklyn Clarabel Marion 06/30/2023, 2:59 AM

## 2023-07-01 ENCOUNTER — Inpatient Hospital Stay (HOSPITAL_COMMUNITY)

## 2023-07-01 DIAGNOSIS — J441 Chronic obstructive pulmonary disease with (acute) exacerbation: Secondary | ICD-10-CM | POA: Diagnosis not present

## 2023-07-01 DIAGNOSIS — J90 Pleural effusion, not elsewhere classified: Secondary | ICD-10-CM | POA: Diagnosis not present

## 2023-07-01 DIAGNOSIS — J9621 Acute and chronic respiratory failure with hypoxia: Secondary | ICD-10-CM | POA: Diagnosis not present

## 2023-07-01 DIAGNOSIS — J9601 Acute respiratory failure with hypoxia: Secondary | ICD-10-CM | POA: Diagnosis not present

## 2023-07-01 LAB — BASIC METABOLIC PANEL WITH GFR
Anion gap: 10 (ref 5–15)
BUN: 19 mg/dL (ref 8–23)
CO2: 42 mmol/L — ABNORMAL HIGH (ref 22–32)
Calcium: 8.5 mg/dL — ABNORMAL LOW (ref 8.9–10.3)
Chloride: 85 mmol/L — ABNORMAL LOW (ref 98–111)
Creatinine, Ser: 1.02 mg/dL (ref 0.61–1.24)
GFR, Estimated: >60 mL/min (ref >60)
Glucose, Bld: 112 mg/dL — ABNORMAL HIGH (ref 70–99)
Potassium: 3.6 mmol/L (ref 3.5–5.1)
Sodium: 137 mmol/L (ref 135–145)

## 2023-07-01 LAB — CBC
HCT: 33 % — ABNORMAL LOW (ref 39.0–52.0)
Hemoglobin: 9.4 g/dL — ABNORMAL LOW (ref 13.0–17.0)
MCH: 26 pg (ref 26.0–34.0)
MCHC: 28.5 g/dL — ABNORMAL LOW (ref 30.0–36.0)
MCV: 91.4 fL (ref 80.0–100.0)
Platelets: 278 10*3/uL (ref 150–400)
RBC: 3.61 MIL/uL — ABNORMAL LOW (ref 4.22–5.81)
RDW: 16.2 % — ABNORMAL HIGH (ref 11.5–15.5)
WBC: 8.3 10*3/uL (ref 4.0–10.5)
nRBC: 0 % (ref 0.0–0.2)

## 2023-07-01 MED ORDER — POTASSIUM CHLORIDE CRYS ER 20 MEQ PO TBCR
40.0000 meq | EXTENDED_RELEASE_TABLET | Freq: Once | ORAL | Status: AC
  Administered 2023-07-01: 40 meq via ORAL
  Filled 2023-07-01: qty 2

## 2023-07-01 MED ORDER — SALINE SPRAY 0.65 % NA SOLN: 2.0000 | NASAL | Status: DC | PRN

## 2023-07-01 MED ORDER — ENSURE PLUS HIGH PROTEIN PO LIQD
237.0000 mL | Freq: Two times a day (BID) | ORAL | Status: DC
  Administered 2023-07-01: 237 mL via ORAL

## 2023-07-01 MED ORDER — METHYLPREDNISOLONE SODIUM SUCC 40 MG IJ SOLR
40.0000 mg | Freq: Two times a day (BID) | INTRAMUSCULAR | Status: DC
  Administered 2023-07-01 – 2023-07-05 (×8): 40 mg via INTRAVENOUS
  Filled 2023-07-01 (×8): qty 1

## 2023-07-01 MED ORDER — SODIUM CHLORIDE 0.9 % IV SOLN
2.0000 g | Freq: Three times a day (TID) | INTRAVENOUS | Status: AC
  Administered 2023-07-01 – 2023-07-05 (×12): 2 g via INTRAVENOUS
  Filled 2023-07-01 (×12): qty 12.5

## 2023-07-01 NOTE — Plan of Care (Signed)
  Problem: Nutrition: Goal: Adequate nutrition will be maintained Outcome: Progressing   Problem: Coping: Goal: Level of anxiety will decrease Outcome: Progressing   Problem: Pain Managment: Goal: General experience of comfort will improve and/or be controlled Outcome: Progressing   Problem: Skin Integrity: Goal: Risk for impaired skin integrity will decrease Outcome: Progressing

## 2023-07-01 NOTE — Progress Notes (Signed)
 NAME:  Philip [name], MRN:  [id number], DOB:  Oct 05, 1955, LOS: 1 ADMISSION DATE:  06/29/2023, CONSULTATION DATE:  06/29/2023 REFERRING MD: Jannette Mend - TR[facility], CHIEF COMPLAINT:  Dyspnea   History of Present Illness:  Philip [name] is a 68 y.o. 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 He is followed by Dr. Marygrace Snellen in the clinic.  He was getting a routine surveillance CT of the abdomen and pelvis on 5/27 and while at the facility, he had significant dyspnea which prompted ED evaluation. In ED, he was evaluated and admitted by the hospital service and PCCM was called in consultation for assistance with his pulmonary care.  To the pulmonary critical care team the sister [name] and the patient expressed significant fear of dying, anxiety about dying, class IV dyspnea at baseline.  Requesting anxiolytic medications.  Also chronic pain on opioids.  They feel like they want relief from the symptoms in order to help her quality of life.  He is willing to try BiPAP but reports that in the past at [facility] when BiPAP was tried proper instructions were not given and is frustrated by that patient experience.  Pertinent Medical History:   Past Medical History:  Diagnosis Date   Chronic kidney disease     COPD (chronic obstructive pulmonary disease) (HCC)    Dyspnea    GERD (gastroesophageal reflux disease)    Headache    migraines   Hyperlipidemia    lung ca dx'd 12/2018   On home oxygen therapy    3 to 10 L   Pneumonia    Pulmonary embolus (HCC)    Tobacco abuse    Significant Hospital Events: Including procedures, antibiotic start and stop dates in addition to other pertinent events   5/27 - Admitted.  Interim History / Subjective:  Tolerated BiPAP much better overnight with Atarax/morphine  PRN Feels better rested today Mild epistaxis likely 2/2 nasal dryness from HHFNC (nasal saline added) Mild conversational dyspnea/desats to high 80s with conversation Remains on HHFNC 50L/FiO2 70% CXR with stable R pleural effusion Patient requested code status change to FULL 5/28PM, d/w NP Bailey Lesser and daughters (at bedside)  Objective:  Blood pressure 102/74, pulse (!) 101, temperature 98.4 F (36.9 C), temperature source Axillary, resp. rate (!) 24, height 5\' 6"  (1.676 m), weight 90.1 kg, SpO2 97%.    FiO2 (%):  [65 %-75 %] 70 %   Intake/Output Summary (Last 24 hours) at 07/01/2023 0749 Last data filed at 06/30/2023 1802 Gross per 24 hour  Intake 530 ml  Output 1625 ml  Net -1095 ml   Filed Weights   06/29/23 1149 06/29/23 1635  Weight: 90 kg 90.1 kg   Physical Examination: General: Acute-on-chronically ill-appearing middle-aged man in NAD. Appears more comfortable than prior. HEENT: [employer]/AT, anicteric sclera, PERRL, moist mucous membranes. Dry nasal passages, scant blood noted. Neuro: Awake, oriented  x 4. Responds to verbal stimuli. Following commands consistently. Moves all 4 extremities spontaneously. Mild generalized weakness.  CV: RRR, no m/g/r. PULM: Breathing mildly tachypneic to mid 20s, mildly labored on HHFNC (50L/FiO2 70%) Lung fields coarse in upper fields, diminished in lower fields R > L. GI: Soft, nontender, mildly distended. Normoactive bowel  sounds. Extremities: Bilateral symmetric 1+ LE edema noted. Skin: Warm/dry, no rashes.  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, in remission Hx pulmonary MAI infection, last evaluated in 2020 and not previously treated Recent Pseudomonas pneumonia s/p treatment Chronic small right pleural effusion, bedside POCUS 5/28 without fluid amenable to tap Chronic cor pulmonale, group 3 Tracheobronchial malacia Acute on chronic pulmonary edema Chronic pain Anxiety  - Supplemental O2 support to maintain sat > 88% - BiPAP QHS and PRN - Wean FiO2 for O2 sat  90% - RVP +parainfluenza, CXR stable - Urine strep negative, legionella pending - Continue steroids - Triple therapy (Brovana /Yupelri /Pulmicort ) + DuoNebs PRN - Diuresis as tolerated, monitor I&Os and renal indices - Pulmonary hygiene - Encourage mobilization/OOB versus bed exercises, PT/OT - Empiric HCAP coverage discontinued 5/28, as RVP + - Anxiety/dyspnea palliation with morphine , benzos PRN - Supportive care as above to assist patient through parainfluenza infection; nearing the maximum amount of oxygenation/ventilation support we can provide without intubation - Patient d/w family and primary team 5/28, made FULL CODE status; would like to avoid intubation if possible but is open to it as a last resort even if his ability to wean from the ventilator is very slim - Encourage PMT consult to ensure patient is supported in all aspects of his disease process, especially if clinical status worsens  Best practice (evaluated daily):  Per Primary  Team  Signature:   [facility] 07/01/23 7:49 AM  Please see [email or web address] for pager details.  From 7A-7P if no response, please call [phone] After hours, please call ELink [phone]

## 2023-07-01 NOTE — Progress Notes (Signed)
 PROGRESS NOTE    Philip [name]  WUJ:[id number] DOB: 19-Jul-1955 DOA: 06/29/2023 PCP: [name], MD    Brief Narrative:  Philip [name] is a 68 y.o. 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  History is provided by the patient, as well as his sister who is at the bedside.  Chronically he is on 5 L nasal cannula oxygen, has titrated up as high as 10 L/min with any kind of exertion, even trying to sit up straight in a chair.  Been this way for many months.  He was on chemotherapy under the care of Dr. Liam Redhead, however carboplatin  was stopped after second cycle given anaphylaxis, and documentation indicates that no further chemotherapy is planned.     Assessment and Plan: Acute on chronic hypoxic respiratory failure-he has COPD with chronic hypoxic respiratory failure as well as history of pulmonary MAI infection and known right sided pleural effusion.  He has increased sputum production and persistent cough which are atypical for him, but otherwise no significant change in his symptomatology.---- found to have parainfluenza virus -Continue supplemental oxygen, with goal O2 saturation 88 to 90% -Continue nebulized LAMA, LABA, ICS - Lasix  60 mg IV x 1 and then PO -IV steroids -on zosyn/vanc- procal negative-- d/c abx- sputum culture pending - pulmonology consultation for further recommendations -wean O2 as able-- RT to try HF Soham -open to hear about LTAC-- will place TOC consult   Stage III lung adenocarcinoma-under the care of Dr. [name], no longer on treatment- added to treatment team -Norco as needed for pain   History of PE -continue Xarelto  indefinitely in the setting of malignancy    BPH -Flomax      DVT prophylaxis:  rivaroxaban  (XARELTO ) tablet 20 mg    Code Status: Full Code- changed overnight and he is not interested in palliative care consult Family Communication: None at bedside  Disposition Plan:  Level of care: Stepdown Status is: Inpatient   Consultants:  pccm   Subjective: Slept well overnight with the Atarax  Objective: Vitals:   07/01/23 1100 07/01/23 1200 07/01/23 1300 07/01/23 1400  BP: (!) 85/66 90/62 95/76  103/75  Pulse: (!) 106 (!) 102 (!) 108 (!) 103  Resp: (!) 26 (!) 31 18 18   Temp:      TempSrc:      SpO2: 98% 95% 98% 100%  Weight:      Height:        Intake/Output Summary (Last 24 hours) at 07/01/2023 1411 Last data filed at 07/01/2023 0900 Gross per 24 hour  Intake 290 ml  Output 975 ml  Net -685 ml   Filed Weights   06/29/23 1149 06/29/23 1635  Weight: 90 kg 90.1 kg    Examination:   General: Appearance:    Obese male in no acute distress     Lungs:   Unlabored at rest, on high flow nasal cannula, diminished  Heart:    Tachycardic.    MS:   All extremities are intact.    Neurologic:   Awake, alert       Data Reviewed: I have personally reviewed following labs and imaging studies  CBC: Recent Labs  Lab 06/29/23 0903 06/30/23 0300 07/01/23 0309  WBC 9.0 7.6 8.3  NEUTROABS 7.8*  --   --   HGB 9.6* 10.2* 9.4*  HCT 32.1* 35.8* 33.0*  MCV 87.0 92.5 91.4  PLT 331 315 278   Basic Metabolic Panel: Recent Labs  Lab 06/29/23 0903 06/30/23 0300 07/01/23 0309  NA 139 139 137  K 4.8 3.5 3.6  CL 94* 86* 85*  CO2 40* 40* 42*  GLUCOSE 136* 131* 112*  BUN 18 14 19   CREATININE 0.96 1.19 1.02  CALCIUM  9.4 8.5* 8.5*   GFR: Estimated Creatinine Clearance: 73.9 mL/min (by C-G formula based on SCr of 1.02 mg/dL). Liver Function Tests: Recent Labs  Lab 06/29/23 0903 06/30/23 0300  AST 20 26  ALT 20 22  ALKPHOS 86 87  BILITOT 0.4 1.1  PROT 6.6 6.7  ALBUMIN  3.7 3.3*   No results for input(s):  "LIPASE", "AMYLASE" in the last 168 hours. No results for input(s): "AMMONIA" in the last 168 hours. Coagulation Profile: No results for input(s): "INR", "PROTIME" in the last 168 hours. Cardiac Enzymes: No results for input(s): "CKTOTAL", "CKMB", "CKMBINDEX", "TROPONINI" in the last 168 hours. BNP (last 3 results) No results for input(s): "PROBNP" in the last 8760 hours. HbA1C: No results for input(s): "HGBA1C" in the last 72 hours. CBG: No results for input(s): "GLUCAP" in the last 168 hours. Lipid Profile: No results for input(s): "CHOL", "HDL", "LDLCALC", "TRIG", "CHOLHDL", "LDLDIRECT" in the last 72 hours. Thyroid  Function Tests: No results for input(s): "TSH", "T4TOTAL", "FREET4", "T3FREE", "THYROIDAB" in the last 72 hours. Anemia Panel: No results for input(s): "VITAMINB12", "FOLATE", "FERRITIN", "TIBC", "IRON", "RETICCTPCT" in the last 72 hours. Sepsis Labs: Recent Labs  Lab 06/30/23 0300  PROCALCITON <0.10    Recent Results (from the past 240 hours)  Expectorated Sputum Assessment w Gram Stain, Rflx to Resp Cult     Status: None   Collection Time: 06/29/23  4:34 PM   Specimen: Expectorated Sputum  Result Value Ref Range Status   Specimen Description EXPECTORATED SPUTUM  Final   Special Requests NONE  Final   Sputum evaluation   Final    THIS SPECIMEN IS ACCEPTABLE FOR SPUTUM CULTURE Performed at [facility], [address], [place], [place] [address]    Report Status 06/29/2023 FINAL  Final  Culture, Respiratory w Gram Stain     Status: None (Preliminary result)   Collection Time: 06/29/23  4:34 PM  Result Value Ref Range Status   Specimen Description   Final    EXPECTORATED SPUTUM Performed at [facility], [address], [place], [place] [address]    Special Requests   Final    NONE Reflexed from [id number] Performed at [facility], [address], [place], [place] [address]    Gram Stain   Final     ABUNDANT WBC PRESENT, PREDOMINANTLY PMN ABUNDANT GRAM NEGATIVE RODS RARE GRAM POSITIVE COCCI    Culture   Final    FEW PSEUDOMONAS AERUGINOSA CULTURE REINCUBATED FOR BETTER GROWTH Performed at [facility], [address], [place], [place] [address]    Report Status PENDING  Incomplete  Respiratory (~20 pathogens) panel by PCR     Status: Abnormal   Collection Time: 06/29/23  8:40 PM   Specimen: Nasopharyngeal Swab; Respiratory  Result Value Ref Range Status   Adenovirus NOT DETECTED NOT DETECTED Final   Coronavirus 229E NOT DETECTED NOT DETECTED Final    Comment: (NOTE) The Coronavirus on the Respiratory Panel, DOES NOT test for the novel  Coronavirus (2019  nCoV)    Coronavirus HKU1 NOT DETECTED NOT DETECTED Final   Coronavirus NL63 NOT DETECTED NOT DETECTED Final   Coronavirus OC43 NOT DETECTED NOT DETECTED Final   Metapneumovirus NOT DETECTED NOT DETECTED Final   Rhinovirus / Enterovirus NOT DETECTED NOT DETECTED Final   Influenza A NOT DETECTED NOT DETECTED Final   Influenza B NOT DETECTED NOT DETECTED Final   Parainfluenza Virus 1 NOT DETECTED NOT DETECTED Final   Parainfluenza Virus 2 NOT DETECTED NOT DETECTED Final   Parainfluenza Virus 3 DETECTED (A) NOT DETECTED Final   Parainfluenza Virus 4 NOT DETECTED NOT DETECTED Final   Respiratory Syncytial Virus NOT DETECTED NOT DETECTED Final   Bordetella pertussis NOT DETECTED NOT DETECTED Final   Bordetella Parapertussis NOT DETECTED NOT DETECTED Final   Chlamydophila pneumoniae NOT DETECTED NOT DETECTED Final   Mycoplasma pneumoniae NOT DETECTED NOT DETECTED Final    Comment: Performed at [facility], [address], [place], [place] [address]  MRSA Next Gen by PCR, Nasal     Status: None   Collection Time: 06/29/23  8:40 PM   Specimen: Nasal Mucosa; Nasal Swab  Result Value Ref Range Status   MRSA by PCR Next Gen NOT DETECTED NOT DETECTED Final    Comment: (NOTE) The GeneXpert MRSA Assay (FDA approved for  NASAL specimens only), is one component of a comprehensive MRSA colonization surveillance program. It is not intended to diagnose MRSA infection nor to guide or monitor treatment for MRSA infections. Test performance is not FDA approved in patients less than 35 years old. Performed at [facility], [address] [place], [place] [address]          Radiology Studies: DG Chest Port 1 View Result Date: 07/01/2023 CLINICAL DATA:  Shortness of breath EXAM: PORTABLE CHEST 1 VIEW COMPARISON:  Chest x-ray performed Jun 29, 2023 FINDINGS: Similar appearance of perihilar scarring and blunting of the right costophrenic angle. Heart mediastinum are not significantly changed. IMPRESSION: 1. No significant interval change. 2. Stable right pleural effusion. 3. Similar appearance of perihilar scarring. Electronically Signed   By: Reagan Camera M.D.   On: 07/01/2023 09:01        Scheduled Meds:  arformoterol   15 mcg Nebulization BID   budesonide   0.5 mg Nebulization BID   Chlorhexidine  Gluconate Cloth  6 each Topical Daily   feeding supplement  237 mL Oral BID BM   furosemide   40 mg Oral Daily   magnesium  oxide  400 mg Oral Daily   methylPREDNISolone  (SOLU-MEDROL ) injection  40 mg Intravenous Q12H   pantoprazole   40 mg Oral Daily   revefenacin   175 mcg Nebulization Daily   rivaroxaban   20 mg Oral Q supper   tamsulosin   0.4 mg Oral QHS   Continuous Infusions:     LOS: 1 day    Time spent: 45 minutes spent on chart review, discussion with nursing staff, consultants, updating family and interview/physical exam; more than 50% of that time was spent in counseling and/or coordination of care.    Enrigue Harvard, DO [facility] Available via Epic secure chat 7am-7pm After these hours, please refer to coverage provider listed on [email or web address] 07/01/2023, 2:11 PM

## 2023-07-01 NOTE — Progress Notes (Signed)
   07/01/23 2255  BiPAP/CPAP/SIPAP  BiPAP/CPAP/SIPAP Pt Type Adult  BiPAP/CPAP/SIPAP V60  Mask Type Full face mask  Dentures removed? Not applicable  Mask Size Medium  Set Rate 18 breaths/min  Respiratory Rate 18 breaths/min  IPAP 12 cmH20  EPAP 5 cmH2O  FiO2 (%) 60 %  Flow Rate 0 lpm  Minute Ventilation 10.6  Leak 32  Peak Inspiratory Pressure (PIP) 12  Tidal Volume (Vt) 415  Patient Home Machine No  Patient Home Mask No  Patient Home Tubing No  Auto Titrate No  Press High Alarm 30 cmH2O  Press Low Alarm 5 cmH2O  Device Plugged into RED Power Outlet Yes  Oxygen Percent 60 %  BiPAP/CPAP /SiPAP Vitals  Pulse Rate (!) 107  Resp 18  BP 121/74  SpO2 98 %  Bilateral Breath Sounds Expiratory wheezes;Diminished  MEWS Score/Color  MEWS Score 1  MEWS Score Color Green

## 2023-07-01 NOTE — Plan of Care (Signed)
  Problem: Education: Goal: Knowledge of General Education information will improve Description: Including pain rating scale, medication(s)/side effects and non-pharmacologic comfort measures Outcome: Progressing   Problem: Clinical Measurements: Goal: Will remain free from infection Outcome: Progressing Goal: Diagnostic test results will improve Outcome: Progressing Goal: Cardiovascular complication will be avoided Outcome: Progressing   Problem: Nutrition: Goal: Adequate nutrition will be maintained Outcome: Progressing   Problem: Coping: Goal: Level of anxiety will decrease Outcome: Progressing   Problem: Elimination: Goal: Will not experience complications related to bowel motility Outcome: Progressing Goal: Will not experience complications related to urinary retention Outcome: Progressing   Problem: Clinical Measurements: Goal: Respiratory complications will improve Outcome: Not Progressing   Problem: Activity: Goal: Risk for activity intolerance will decrease Outcome: Not Progressing

## 2023-07-01 NOTE — TOC Progression Note (Signed)
 Transition of Care [facility]) - Progression Note    Patient Details  Name: Philip [name] MRN: [id number] Date of Birth: March 31, 1955  Transition of Care [facility]) CM/SW Contact  Tessie Fila, RN Phone Number: 07/01/2023, 3:54 PM  Clinical Narrative:    Pt referred for LTAC. Ciera from [facility] is following this pt. TOC continuing to follow for any DC needs.    Expected Discharge Plan: Home w Hospice Care Barriers to Discharge: Continued Medical Work up  Expected Discharge Plan and Services In-house Referral: NA Discharge Planning Services: CM Consult Post Acute Care Choice: Durable Medical Equipment, Hospice Living arrangements for the past 2 months: Single Family Home                 DME Arranged: Oxygen, Walker rolling with seat DME Agency: [facility]       HH Arranged: NA HH Agency: Other - See comment Photographer)         Social Determinants of Health (SDOH) Interventions SDOH Screenings   Food Insecurity: Patient Unable To Answer (06/30/2023)  Housing: Patient Unable To Answer (06/30/2023)  Transportation Needs: Patient Unable To Answer (06/30/2023)  Utilities: Patient Unable To Answer (06/30/2023)  Social Connections: Patient Unable To Answer (06/30/2023)  Tobacco Use: Medium Risk (06/29/2023)    Readmission Risk Interventions    05/24/2023    5:05 PM 01/22/2022   11:05 AM 01/14/2022   12:03 PM  Readmission Risk Prevention Plan  Transportation Screening Complete Complete Complete  PCP or Specialist Appt within 5-7 Days  Complete Complete  PCP or Specialist Appt within 3-5 Days Complete    Home Care Screening  Complete Complete  Medication Review (RN CM)  Complete Complete  HRI or Home Care Consult Complete    Social Work Consult for Recovery Care Planning/Counseling Complete    Palliative Care Screening Not Applicable    Medication Review Oceanographer) Referral to Pharmacy

## 2023-07-02 DIAGNOSIS — J449 Chronic obstructive pulmonary disease, unspecified: Secondary | ICD-10-CM | POA: Diagnosis not present

## 2023-07-02 DIAGNOSIS — J9601 Acute respiratory failure with hypoxia: Secondary | ICD-10-CM | POA: Diagnosis not present

## 2023-07-02 DIAGNOSIS — J122 Parainfluenza virus pneumonia: Secondary | ICD-10-CM | POA: Diagnosis not present

## 2023-07-02 LAB — BASIC METABOLIC PANEL WITH GFR
Anion gap: 10 (ref 5–15)
BUN: 22 mg/dL (ref 8–23)
CO2: 35 mmol/L — ABNORMAL HIGH (ref 22–32)
Calcium: 8.4 mg/dL — ABNORMAL LOW (ref 8.9–10.3)
Chloride: 90 mmol/L — ABNORMAL LOW (ref 98–111)
Creatinine, Ser: 0.7 mg/dL (ref 0.61–1.24)
GFR, Estimated: >60 mL/min (ref >60)
Glucose, Bld: 163 mg/dL — ABNORMAL HIGH (ref 70–99)
Potassium: 4.3 mmol/L (ref 3.5–5.1)
Sodium: 135 mmol/L (ref 135–145)

## 2023-07-02 LAB — PHOSPHORUS: Phosphorus: 2.8 mg/dL (ref 2.5–4.6)

## 2023-07-02 LAB — CBC
HCT: 31.4 % — ABNORMAL LOW (ref 39.0–52.0)
Hemoglobin: 9.2 g/dL — ABNORMAL LOW (ref 13.0–17.0)
MCH: 26.4 pg (ref 26.0–34.0)
MCHC: 29.3 g/dL — ABNORMAL LOW (ref 30.0–36.0)
MCV: 90 fL (ref 80.0–100.0)
Platelets: 295 10*3/uL (ref 150–400)
RBC: 3.49 MIL/uL — ABNORMAL LOW (ref 4.22–5.81)
RDW: 16.1 % — ABNORMAL HIGH (ref 11.5–15.5)
WBC: 8.3 10*3/uL (ref 4.0–10.5)
nRBC: 0 % (ref 0.0–0.2)

## 2023-07-02 LAB — CREATININE, SERUM
Creatinine, Ser: 0.76 mg/dL (ref 0.61–1.24)
GFR, Estimated: >60 mL/min (ref >60)

## 2023-07-02 LAB — MAGNESIUM: Magnesium: 2.2 mg/dL (ref 1.7–2.4)

## 2023-07-02 LAB — LEGIONELLA PNEUMOPHILA SEROGP 1 UR AG: L. pneumophila Serogp 1 Ur Ag: NEGATIVE

## 2023-07-02 LAB — GLUCOSE, CAPILLARY: Glucose-Capillary: 164 mg/dL — ABNORMAL HIGH (ref 70–99)

## 2023-07-02 NOTE — Plan of Care (Signed)
  Problem: Education: Goal: Knowledge of General Education information will improve Description: Including pain rating scale, medication(s)/side effects and non-pharmacologic comfort measures Outcome: Progressing   Problem: Clinical Measurements: Goal: Cardiovascular complication will be avoided Outcome: Progressing   Problem: Pain Managment: Goal: General experience of comfort will improve and/or be controlled Outcome: Progressing   Problem: Safety: Goal: Ability to remain free from injury will improve Outcome: Progressing

## 2023-07-02 NOTE — Progress Notes (Signed)
 Physical Therapy Treatment Patient Details Name: Philip [name] MRN: [id number] DOB: 06/07/1955 Today's Date: 07/02/2023   History of Present Illness Philip [name] is a 68 y.o. 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 He is followed by Dr. Marygrace Snellen in the clinic.  He was getting a routine surveillance CT of the abdomen and pelvis on 5/27 and while at the facility, he had significant dyspnea which prompted ED evaluation. In ED, he was evaluated and admitted by the hospital service and PCCM was called in consultation for assistance with his pulmonary care.    PT Comments  Pt seen in ICU RM # [id number] Pt in bed on HHFNC 50 L/Min, 60% FiO2 at 98% sats. Assisted OOB to recliner went well.  Pt self able to transition to EOB and stand without physical assistance.  Required assist with line management only.   Pt feeling "better".  Only c/o was "coughing up a lot of mucous" Pt lives home with Spouse.  Pt was on oxygen prior to admit of 5 lts at rest and 10 lts with activity.  Pt plans to return home when medically stable.   If plan is discharge home, recommend the following: A little help with walking and/or transfers;A little help with bathing/dressing/bathroom;Assistance with cooking/housework;Assist for  transportation;Help with stairs or ramp for entrance   Can travel by private vehicle        Equipment Recommendations  None recommended by PT    Recommendations for Other Services       Precautions / Restrictions Precautions Precautions: Fall Recall of Precautions/Restrictions: Intact Precaution/Restrictions Comments: monitor O2 Restrictions Weight Bearing Restrictions Per Provider Order: No     Mobility  Bed Mobility Overal bed mobility: Modified Independent             General bed mobility comments: self able with HOB elevated and use of rail.  Assist for multiple lines/leads/tubes.    Transfers Overall transfer level: Needs assistance Equipment used: 1 person hand held assist Transfers: Sit to/from Stand Sit to Stand: Supervision           General transfer comment: Pt self able to rise with good use of hands to support self only requiring asisst for multiple lines/leads/tubes    Ambulation/Gait               General Gait Details: transfers only Pt on [facility]   Stairs             Wheelchair Mobility     Tilt Bed    Modified Rankin (Stroke Patients Only)       Balance                                            Communication    Cognition  Arousal: Alert Behavior During Therapy: WFL for tasks assessed/performed   PT - Cognitive impairments: No apparent impairments                       PT - Cognition Comments: AxO x 3 pleasant and motived.  Still works full time and lives in 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        Pertinent Vitals/Pain Pain Assessment Pain Assessment: No/denies pain    Home Living                          Prior Function            PT Goals (current goals can now be found in the care plan section) Progress towards PT goals: Progressing toward goals    Frequency    Min 3X/week      PT Plan       Co-evaluation              AM-PAC PT "6 Clicks" Mobility   Outcome Measure  Help needed turning from your back to your side while in a flat bed without using bedrails?: None Help needed moving from lying on your back to sitting on the side of a flat bed without using bedrails?: None Help needed moving to and from a bed to a chair (including a wheelchair)?: None Help needed standing up from a chair using your arms (e.g., wheelchair or bedside chair)?: None Help needed to walk in hospital room?: A Little Help needed climbing 3-5 steps with a railing? : A Little 6 Click Score: 22    End of Session Equipment Utilized During Treatment: Oxygen Activity Tolerance: Treatment limited secondary to medical complications (Comment) Patient left: in chair;with call bell/phone within reach Nurse Communication: Mobility status PT Visit Diagnosis: Difficulty in walking, not elsewhere classified (R26.2)     Time: 1610-9604 PT Time Calculation (min) (ACUTE ONLY): 17 min  Charges:    $Therapeutic Activity: 8-22 mins PT General Charges $$ ACUTE PT VISIT: 1 Visit                     Bess Broody  PTA Acute  Rehabilitation Services Office M-F          [phone]

## 2023-07-02 NOTE — Progress Notes (Signed)
 PROGRESS NOTE    Philip [name]  ZOX:[id number] DOB: 1955/03/20 DOA: 06/29/2023 PCP: [name], MD    Brief Narrative:  Philip [name] is a 68 y.o. 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  History is provided by the patient, as well as his sister who is at the bedside.  Chronically he is on 5 L nasal cannula oxygen, has titrated up as high as 10 L/min with any kind of exertion, even trying to sit up straight in a chair.  Been this way for many months.  He was on chemotherapy under the care of Dr. Liam Redhead, however carboplatin  was stopped after second cycle given anaphylaxis, and documentation indicates that no further chemotherapy is planned.     Assessment and Plan: Acute on chronic hypoxic respiratory failure-he has COPD with chronic hypoxic respiratory failure as well as history of pulmonary MAI infection and known right sided pleural effusion.  He has increased sputum production and persistent cough which are atypical for him, but otherwise no significant change in his symptomatology.---- found to have parainfluenza virus -Continue supplemental oxygen, with goal O2 saturation 88 to 90% -Continue nebulized LAMA, LABA, ICS - Lasix  60 mg IV x 1 and then PO -IV steroids -on zosyn /vanc- procal negative-- d/c abx- sputum culture with few pseudomonas-- defer to PCCM for treatment - pulmonology consultation for further recommendations -wean O2 as able -LTAC consulted   Stage III lung adenocarcinoma-under the care of Dr. [name], no longer on treatment- added to treatment team -Norco as needed for pain   History of PE -continue Xarelto  indefinitely in the setting of malignancy   BPH -Flomax     OOB to chair  DVT prophylaxis:  rivaroxaban  (XARELTO ) tablet 20 mg    Code Status: Full Code- changed overnight and he is not interested in palliative care consult Family Communication: None at bedside  Disposition Plan:  Level of care: Stepdown Status is: Inpatient   Consultants:  pccm   Subjective: Ordering breakfast  Objective: Vitals:   07/02/23 1000 07/02/23 1109 07/02/23 1117 07/02/23 1318  BP: (!) 116/55     Pulse: (!) 112     Resp:      Temp:   97.7 F (36.5 C)   TempSrc:   Oral   SpO2: (!) 72% 92%  99%  Weight:      Height:        Intake/Output Summary (Last 24 hours) at 07/02/2023 1330 Last data filed at 07/02/2023 8119 Gross per 24 hour  Intake 299.84 ml  Output 1700 ml  Net -1400.16 ml   Filed Weights   06/29/23 1149 06/29/23 1635  Weight: 90 kg 90.1 kg    Examination:   General: Appearance:    Obese male in no acute distress     Lungs:   Unlabored at rest, on high flow nasal cannula, diminished  Heart:    Tachycardic.    MS:   All extremities are intact.    Neurologic:   Awake, alert       Data Reviewed: I have personally reviewed following labs and imaging studies  CBC: Recent Labs  Lab 06/29/23 0903 06/30/23 0300 07/01/23 0309 07/02/23 0308  WBC 9.0 7.6 8.3 8.3  NEUTROABS 7.8*  --   --   --  HGB 9.6* 10.2* 9.4* 9.2*  HCT 32.1* 35.8* 33.0* 31.4*  MCV 87.0 92.5 91.4 90.0  PLT 331 315 278 295   Basic Metabolic Panel: Recent Labs  Lab 06/29/23 0903 06/30/23 0300 07/01/23 0309 07/02/23 0308 07/02/23 0313  NA 139 139 137 135  --   K 4.8 3.5 3.6 4.3  --   CL 94* 86* 85* 90*  --   CO2 40* 40* 42* 35*  --   GLUCOSE 136* 131* 112* 163*  --   BUN 18 14 19 22   --   CREATININE 0.96 1.19 1.02 0.70 0.76  CALCIUM  9.4 8.5* 8.5* 8.4*  --   MG  --   --   --   --  2.2  PHOS  --   --   --   --  2.8   GFR: Estimated Creatinine Clearance: 94.2 mL/min (by C-G formula based on SCr of 0.76 mg/dL). Liver Function Tests: Recent  Labs  Lab 06/29/23 0903 06/30/23 0300  AST 20 26  ALT 20 22  ALKPHOS 86 87  BILITOT 0.4 1.1  PROT 6.6 6.7  ALBUMIN  3.7 3.3*   No results for input(s): "LIPASE", "AMYLASE" in the last 168 hours. No results for input(s): "AMMONIA" in the last 168 hours. Coagulation Profile: No results for input(s): "INR", "PROTIME" in the last 168 hours. Cardiac Enzymes: No results for input(s): "CKTOTAL", "CKMB", "CKMBINDEX", "TROPONINI" in the last 168 hours. BNP (last 3 results) No results for input(s): "PROBNP" in the last 8760 hours. HbA1C: No results for input(s): "HGBA1C" in the last 72 hours. CBG: Recent Labs  Lab 07/02/23 0821  GLUCAP 164*   Lipid Profile: No results for input(s): "CHOL", "HDL", "LDLCALC", "TRIG", "CHOLHDL", "LDLDIRECT" in the last 72 hours. Thyroid  Function Tests: No results for input(s): "TSH", "T4TOTAL", "FREET4", "T3FREE", "THYROIDAB" in the last 72 hours. Anemia Panel: No results for input(s): "VITAMINB12", "FOLATE", "FERRITIN", "TIBC", "IRON", "RETICCTPCT" in the last 72 hours. Sepsis Labs: Recent Labs  Lab 06/30/23 0300  PROCALCITON <0.10    Recent Results (from the past 240 hours)  Expectorated Sputum Assessment w Gram Stain, Rflx to Resp Cult     Status: None   Collection Time: 06/29/23  4:34 PM   Specimen: Expectorated Sputum  Result Value Ref Range Status   Specimen Description EXPECTORATED SPUTUM  Final   Special Requests NONE  Final   Sputum evaluation   Final    THIS SPECIMEN IS ACCEPTABLE FOR SPUTUM CULTURE Performed at [facility], [address], [place], [place] [address]    Report Status 06/29/2023 FINAL  Final  Culture, Respiratory w Gram Stain     Status: None (Preliminary result)   Collection Time: 06/29/23  4:34 PM  Result Value Ref Range Status   Specimen Description   Final    EXPECTORATED SPUTUM Performed at [facility], [address], [place], [place] [address]    Special Requests    Final    NONE Reflexed from [id number] Performed at [facility], [address], [place], [place] [address]    Gram Stain   Final    ABUNDANT WBC PRESENT, PREDOMINANTLY PMN ABUNDANT GRAM NEGATIVE RODS RARE GRAM POSITIVE COCCI    Culture   Final    FEW PSEUDOMONAS AERUGINOSA SUSCEPTIBILITIES TO FOLLOW Performed at [facility], [address], [place], [place] [address]    Report Status PENDING  Incomplete  Respiratory (~20 pathogens) panel by PCR     Status:  Abnormal   Collection Time: 06/29/23  8:40 PM   Specimen: Nasopharyngeal Swab; Respiratory  Result Value Ref Range Status   Adenovirus NOT DETECTED NOT DETECTED Final   Coronavirus 229E NOT DETECTED NOT DETECTED Final    Comment: (NOTE) The Coronavirus on the Respiratory Panel, DOES NOT test for the novel  Coronavirus (2019 nCoV)    Coronavirus HKU1 NOT DETECTED NOT DETECTED Final   Coronavirus NL63 NOT DETECTED NOT DETECTED Final   Coronavirus OC43 NOT DETECTED NOT DETECTED Final   Metapneumovirus NOT DETECTED NOT DETECTED Final   Rhinovirus / Enterovirus NOT DETECTED NOT DETECTED Final   Influenza A NOT DETECTED NOT DETECTED Final   Influenza B NOT DETECTED NOT DETECTED Final   Parainfluenza Virus 1 NOT DETECTED NOT DETECTED Final   Parainfluenza Virus 2 NOT DETECTED NOT DETECTED Final   Parainfluenza Virus 3 DETECTED (A) NOT DETECTED Final   Parainfluenza Virus 4 NOT DETECTED NOT DETECTED Final   Respiratory Syncytial Virus NOT DETECTED NOT DETECTED Final   Bordetella pertussis NOT DETECTED NOT DETECTED Final   Bordetella Parapertussis NOT DETECTED NOT DETECTED Final   Chlamydophila pneumoniae NOT DETECTED NOT DETECTED Final   Mycoplasma pneumoniae NOT DETECTED NOT DETECTED Final    Comment: Performed at [facility], [address], [place], [place] [address]  MRSA Next Gen by PCR, Nasal     Status: None   Collection Time: 06/29/23  8:40 PM   Specimen: Nasal Mucosa; Nasal Swab  Result  Value Ref Range Status   MRSA by PCR Next Gen NOT DETECTED NOT DETECTED Final    Comment: (NOTE) The GeneXpert MRSA Assay (FDA approved for NASAL specimens only), is one component of a comprehensive MRSA colonization surveillance program. It is not intended to diagnose MRSA infection nor to guide or monitor treatment for MRSA infections. Test performance is not FDA approved in patients less than 33 years old. Performed at [facility], [address], [place], [place] [address]          Radiology Studies: DG Chest Port 1 View Result Date: 07/01/2023 CLINICAL DATA:  Shortness of breath EXAM: PORTABLE CHEST 1 VIEW COMPARISON:  Chest x-ray performed Jun 29, 2023 FINDINGS: Similar appearance of perihilar scarring and blunting of the right costophrenic angle. Heart mediastinum are not significantly changed. IMPRESSION: 1. No significant interval change. 2. Stable right pleural effusion. 3. Similar appearance of perihilar scarring. Electronically Signed   By: Reagan Camera M.D.   On: 07/01/2023 09:01        Scheduled Meds:  arformoterol   15 mcg Nebulization BID   budesonide   0.5 mg Nebulization BID   Chlorhexidine  Gluconate Cloth  6 each Topical Daily   feeding supplement  237 mL Oral BID BM   furosemide   40 mg Oral Daily   magnesium  oxide  400 mg Oral Daily   methylPREDNISolone  (SOLU-MEDROL ) injection  40 mg Intravenous Q12H   pantoprazole   40 mg Oral Daily   revefenacin   175 mcg Nebulization Daily   rivaroxaban   20 mg Oral Q supper   tamsulosin   0.4 mg Oral QHS   Continuous Infusions:  ceFEPime  (MAXIPIME ) IV Stopped (07/02/23 0918)      LOS: 2 days    Time spent: 45 minutes spent on chart review, discussion with nursing staff, consultants, updating family and interview/physical exam; more than 50% of that time was spent in counseling and/or coordination of care.    Enrigue Harvard, DO [facility] Available via Epic secure  chat 7am-7pm After  these hours, please refer to coverage provider listed on [email or web address] 07/02/2023, 1:30 PM

## 2023-07-02 NOTE — Progress Notes (Signed)
 NAME:  Philip [name], MRN:  [id number], DOB:  05-Apr-1955, LOS: 2 ADMISSION DATE:  06/29/2023, CONSULTATION DATE:  06/29/2023 REFERRING MD: Jannette Mend - TRH, CHIEF COMPLAINT:  Dyspnea   History of Present Illness:  Philip [name] is a 68 y.o. 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 He is followed by Dr. Marygrace Snellen in the clinic.  He was getting a routine surveillance CT of the abdomen and pelvis on 5/27 and while at the facility, he had significant dyspnea which prompted ED evaluation. In ED, he was evaluated and admitted by the hospital service and PCCM was called in consultation for assistance with his pulmonary care.  To the pulmonary critical care team the sister [name] and the patient expressed significant fear of dying, anxiety about dying, class IV dyspnea at baseline.  Requesting anxiolytic medications.  Also chronic pain on opioids.  They feel like they want relief from the symptoms in order to help her quality of life.  He is willing to try BiPAP but reports that in the past at [facility] when BiPAP was tried proper instructions were not given and is frustrated by that patient experience.  Pertinent Medical History:   Past Medical History:  Diagnosis Date   Chronic kidney disease     COPD (chronic obstructive pulmonary disease) (HCC)    Dyspnea    GERD (gastroesophageal reflux disease)    Headache    migraines   Hyperlipidemia    lung ca dx'd 12/2018   On home oxygen therapy    3 to 10 L   Pneumonia    Pulmonary embolus (HCC)    Tobacco abuse    Significant Hospital Events: Including procedures, antibiotic start and stop dates in addition to other pertinent events   5/27 - Admitted. Requiring escalating amounts of O2 support, HHFNC. 5/28 - Tolerating BiPAP QHS. +Parainfluenza on RVP. 5/29 - Few PsA on Resp Cx, prior PsA infection versus colonization; empiric cefepime  coverage. Stable CXR. Patient made decision to be FULL CODE overnight.  Interim History / Subjective:  No significant events overnight Tolerated BiPAP without issues, slept well In better spirits this morning, more talkative, does desat some with conversation CXR stable 5/29 Slowly decreasing FiO2/flow requirements on HHFNC  Objective:  Blood pressure 102/71, pulse 83, temperature 97.6 F (36.4 C), temperature source Axillary, resp. rate 20, height 5\' 6"  (1.676 m), weight 90.1 kg, SpO2 98%.    FiO2 (%):  [60 %-71 %] 60 %   Intake/Output Summary (Last 24 hours) at 07/02/2023 0726 Last data filed at 07/02/2023 0329 Gross per 24 hour  Intake 439.86 ml  Output 1900 ml  Net -1460.14 ml   Filed Weights   06/29/23 1149 06/29/23 1635  Weight: 90 kg 90.1 kg   Physical Examination: General: Acute-on-chronically ill-appearing middle-aged man in NAD. Appears slightly improved from prior. HEENT: [place]/AT, anicteric sclera, PERRL, moist  mucous membranes. HHFNC in place. Neuro: Awake, oriented x 4. Responds to verbal stimuli. Following commands consistently. Moves all 4 extremities spontaneously.  CV: Mildly tachycardic to 100s, regular rhythm, no m/g/r. PULM: Breathing even and mildly labored on HHFNC (FiO2 70%, 50L). Lung fields diminished throughout, R >L. Faint crackles L base. GI: Soft, nontender,  nondistended. Normoactive bowel sounds. Extremities: Trace bilateral symmetric LE edema noted. Skin: Warm/dry, no rashes.  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, in remission Hx pulmonary MAI infection, last evaluated in 2020 and not previously treated Recent Pseudomonas pneumonia s/p treatment Chronic small right pleural effusion, bedside POCUS 5/28 without fluid amenable to tap Chronic cor pulmonale, group 3 Tracheobronchial malacia Acute on chronic pulmonary edema Chronic pain Anxiety  - Supplemental O2 support to maintain sat > 88% - Continue HHFNC for now - BiPAP QHS and PRN - Supportive care for +parainfluenza - Legionella pending - Continue steroids - Triple therapy (Brovana /Yupelri /Pulmicort ) + DuoNebs PRN - Diuresis as tolerated, monitor I&Os/renal indices - Pulmonary hygiene - Encourage mobilization/OOB vs. bed exercises, PT/OT - Empiric PsA coverage with cefepime  x 5-day course - Anxiety/dyspnea palliation with morphine , benzos PRN - Nearing the maximum amount of oxygenation/ventilation support we can provide without intubation - FULL CODE after d/w patient/family/primary team 5/28PM, open to intubation if necessary - Encourage PMT consult to ensure patient is supported in all aspects of his disease process  Best practice (evaluated daily):  Per Primary Team  Signature:   Star [place], [name] [facility] 07/02/23 7:26 AM  Please see [email or web address] for pager details.  From 7A-7P if no response, please call [phone] After hours, please call  ELink [phone]

## 2023-07-03 DIAGNOSIS — J122 Parainfluenza virus pneumonia: Secondary | ICD-10-CM | POA: Diagnosis not present

## 2023-07-03 DIAGNOSIS — J9601 Acute respiratory failure with hypoxia: Secondary | ICD-10-CM | POA: Diagnosis not present

## 2023-07-03 DIAGNOSIS — J8410 Pulmonary fibrosis, unspecified: Secondary | ICD-10-CM | POA: Diagnosis not present

## 2023-07-03 DIAGNOSIS — J9621 Acute and chronic respiratory failure with hypoxia: Secondary | ICD-10-CM | POA: Diagnosis not present

## 2023-07-03 DIAGNOSIS — J449 Chronic obstructive pulmonary disease, unspecified: Secondary | ICD-10-CM | POA: Diagnosis not present

## 2023-07-03 LAB — CBC WITH DIFFERENTIAL/PLATELET
Abs Immature Granulocytes: 0.22 10*3/uL — ABNORMAL HIGH (ref 0.00–0.07)
Basophils Absolute: 0 10*3/uL (ref 0.0–0.1)
Basophils Relative: 0 %
Eosinophils Absolute: 0 10*3/uL (ref 0.0–0.5)
Eosinophils Relative: 0 %
HCT: 29.6 % — ABNORMAL LOW (ref 39.0–52.0)
Hemoglobin: 8.2 g/dL — ABNORMAL LOW (ref 13.0–17.0)
Immature Granulocytes: 2 %
Lymphocytes Relative: 4 %
Lymphs Abs: 0.4 10*3/uL — ABNORMAL LOW (ref 0.7–4.0)
MCH: 25.5 pg — ABNORMAL LOW (ref 26.0–34.0)
MCHC: 27.7 g/dL — ABNORMAL LOW (ref 30.0–36.0)
MCV: 91.9 fL (ref 80.0–100.0)
Monocytes Absolute: 0.6 10*3/uL (ref 0.1–1.0)
Monocytes Relative: 5 %
Neutro Abs: 10.7 10*3/uL — ABNORMAL HIGH (ref 1.7–7.7)
Neutrophils Relative %: 89 %
Platelets: 304 10*3/uL (ref 150–400)
RBC: 3.22 MIL/uL — ABNORMAL LOW (ref 4.22–5.81)
RDW: 15.8 % — ABNORMAL HIGH (ref 11.5–15.5)
WBC: 11.9 10*3/uL — ABNORMAL HIGH (ref 4.0–10.5)
nRBC: 0.2 % (ref 0.0–0.2)

## 2023-07-03 LAB — BASIC METABOLIC PANEL WITH GFR
Anion gap: 6 (ref 5–15)
BUN: 26 mg/dL — ABNORMAL HIGH (ref 8–23)
CO2: 40 mmol/L — ABNORMAL HIGH (ref 22–32)
Calcium: 8.4 mg/dL — ABNORMAL LOW (ref 8.9–10.3)
Chloride: 91 mmol/L — ABNORMAL LOW (ref 98–111)
Creatinine, Ser: 0.95 mg/dL (ref 0.61–1.24)
GFR, Estimated: >60 mL/min (ref >60)
Glucose, Bld: 153 mg/dL — ABNORMAL HIGH (ref 70–99)
Potassium: 4.8 mmol/L (ref 3.5–5.1)
Sodium: 137 mmol/L (ref 135–145)

## 2023-07-03 LAB — MAGNESIUM: Magnesium: 2.3 mg/dL (ref 1.7–2.4)

## 2023-07-03 LAB — CULTURE, RESPIRATORY W GRAM STAIN

## 2023-07-03 LAB — PHOSPHORUS: Phosphorus: 3.1 mg/dL (ref 2.5–4.6)

## 2023-07-03 MED ORDER — POLYETHYLENE GLYCOL 3350 17 G PO PACK
17.0000 g | PACK | Freq: Every day | ORAL | Status: DC
  Administered 2023-07-03 – 2023-07-07 (×3): 17 g via ORAL
  Filled 2023-07-03 (×3): qty 1

## 2023-07-03 MED ORDER — SENNOSIDES-DOCUSATE SODIUM 8.6-50 MG PO TABS: 1.0000 | ORAL_TABLET | Freq: Every evening | ORAL | Status: DC | PRN

## 2023-07-03 MED ORDER — FUROSEMIDE 40 MG PO TABS
40.0000 mg | ORAL_TABLET | Freq: Every day | ORAL | Status: DC
  Administered 2023-07-06 – 2023-07-07 (×2): 40 mg via ORAL
  Filled 2023-07-03 (×2): qty 1

## 2023-07-03 NOTE — Plan of Care (Signed)
 ?  Problem: Clinical Measurements: ?Goal: Ability to maintain clinical measurements within normal limits will improve ?Outcome: Progressing ?Goal: Will remain free from infection ?Outcome: Progressing ?Goal: Diagnostic test results will improve ?Outcome: Progressing ?  ?

## 2023-07-03 NOTE — Progress Notes (Signed)
   07/03/23 1313  Oxygen Therapy/Pulse Ox  O2 Device HHFNC  Heated High Flow Nasal Cannula  Adult Medium  O2 Therapy Oxygen humidified  Heater temperature 93.2 F (34 C)  O2 Flow Rate (L/min) 50 L/min  FiO2 (%) (S)  55 % (Decreased to 55%.)  SpO2 95 %  Safety Instructions Yes (Comment)

## 2023-07-03 NOTE — Progress Notes (Signed)
 NAME:  Philip [name], MRN:  [id number], DOB:  1955-09-17, LOS: 3 ADMISSION DATE:  06/29/2023, CONSULTATION DATE:  06/29/2023 REFERRING MD: Philip [name] - TRH, CHIEF COMPLAINT:  Dyspnea   History of Present Illness:  Philip [name] is a 68 y.o. 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 He is followed by Philip [name] in the clinic.  He was getting a routine surveillance CT of the abdomen and pelvis on 5/27 and while at the facility, he had significant dyspnea which prompted ED evaluation. In ED, he was evaluated and admitted by the hospital service and PCCM was called in consultation for assistance with his pulmonary care.  To the pulmonary critical care team the sister Philip [name] and the patient expressed significant fear of dying, anxiety about dying, class IV dyspnea at baseline.  Requesting anxiolytic medications.  Also chronic pain on opioids.  They feel like they want relief from the symptoms in order to help her quality of life.  He is willing to try BiPAP but reports that in the past at [facility] when BiPAP was tried proper instructions were not given and is frustrated by that patient experience.  Pertinent Medical History:   Past Medical History:  Diagnosis Date   Chronic kidney disease     COPD (chronic obstructive pulmonary disease) (HCC)    Dyspnea    GERD (gastroesophageal reflux disease)    Headache    migraines   Hyperlipidemia    lung ca dx'd 12/2018   On home oxygen therapy    3 to 10 L   Pneumonia    Pulmonary embolus (HCC)    Tobacco abuse    Significant Hospital Events: Including procedures, antibiotic start and stop dates in addition to other pertinent events   5/27 - Admitted.  Interim History / Subjective:   Did have some difficulty last night Got his antianxiety medications but BiPAP was not placed for many hours until later Feels a little tired today On high flow nasal cannula, FiO2 decreased today  Patient requested code status change to FULL 5/28PM, d/w NP Philip [name] and daughters (at bedside)  Objective:  Blood pressure 104/67, pulse 94, temperature 97.9 F (36.6 C), temperature source Oral, resp. rate 18, height 5\' 6"  (1.676 m), weight 91.9 kg, SpO2 95%.    FiO2 (%):  [55 %-60 %] 55 %   Intake/Output Summary (Last 24 hours) at 07/03/2023 1344 Last data filed at 07/03/2023 1300 Gross per 24 hour  Intake 272.07 ml  Output 1225 ml  Net -952.93 ml   Filed Weights   06/29/23 1149 06/29/23 1635 07/03/23 0439  Weight: 90 kg 90.1 kg 91.9 kg   Physical Examination: General: Elderly, chronically ill-appearing HEENT: Moist oral mucosa Neuro: Awake alert interactive, just weak overall  CV: S1-S2 appreciated PULM: On high flow nasal cannula, coarse breath sounds  Labs/imaging personally reviewed:  CXR  5/27 > stable right effusion  Assessment & Plan:   Acute on chronic hypoxic respiratory failure Usually on 5 L of oxygen at baseline - Continue high flow nasal cannula  Acute on chronic obstructive lung disease - Steroid-dependent, continue prednisone  10 daily  Pulmonary fibrosis  History of pulmonary MAI infection Recent Pseudomonas treatment Chronic right pleural effusion -Not amenable to tap on POC US   5/28  Tracheobronchomalacia  Chronic pain Anxiety  Continue oxygen supplementation and wean as tolerated Completed empiric antibiotics for healthcare associated pneumonia Cautious diuresis  Palliative morphine  benzos  Continue other plans of care  Myer Artis, MD [place] [facility] Pager: See Tilford Foley

## 2023-07-03 NOTE — Progress Notes (Signed)
 PROGRESS NOTE    Philip [name]  ZOX:[id number] DOB: [birth year] DOA: 06/29/2023 PCP: [name], MD    Brief Narrative:  Philip [name] is a 68 y.o. 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  History is provided by the patient, as well as his sister who is at the bedside.  Chronically he is on 5 L nasal cannula oxygen, has titrated up as high as 10 L/min with any kind of exertion, even trying to sit up straight in a chair.  Been this way for many months.  He was on chemotherapy under the care of Dr. Liam Redhead, however carboplatin  was stopped after second cycle given anaphylaxis, and documentation indicates that no further chemotherapy is planned.     Assessment and Plan: Acute on chronic hypoxic respiratory failure-he has COPD with chronic hypoxic respiratory failure as well as history of pulmonary MAI infection and known right sided pleural effusion.  He has increased sputum production and persistent cough which are atypical for him, but otherwise no significant change in his symptomatology.---- found to have parainfluenza virus -Continue supplemental oxygen, with goal O2 saturation 88 to 90% -Continue nebulized LAMA, LABA, ICS - Lasix  60 mg IV x 1 and then PO -IV steroids -on zosyn /vanc- procal negative-- d/c abx- sputum culture with few pseudomonas-- defer to PCCM for treatment - pulmonology consultation for further recommendations -wean O2 as able -LTAC consulted   Stage III lung adenocarcinoma-under the care of Dr. [name], no longer on treatment- added to treatment team -Norco as needed for pain   History of PE -continue Xarelto  indefinitely in the setting of malignancy   BPH -Flomax     OOB to chair  DVT prophylaxis:  rivaroxaban  (XARELTO ) tablet 20 mg    Code Status: Full Code- changed overnight and he is not interested in palliative care consult Family Communication: None at bedside  Disposition Plan:  Level of care: Stepdown Status is: Inpatient   Consultants:  pccm   Subjective: Did not sleep well   Objective: Vitals:   07/03/23 0800 07/03/23 0828 07/03/23 0853 07/03/23 0900  BP: 109/88   114/75  Pulse: 91   (!) 112  Resp: 18     Temp:   97.8 F (36.6 C)   TempSrc:   Oral   SpO2: 90% 100%  90%  Weight:      Height:        Intake/Output Summary (Last 24 hours) at 07/03/2023 0956 Last data filed at 07/03/2023 0900 Gross per 24 hour  Intake 272.07 ml  Output 1025 ml  Net -752.93 ml   Filed Weights   06/29/23 1149 06/29/23 1635 07/03/23 0439  Weight: 90 kg 90.1 kg 91.9 kg    Examination:   General: Appearance:    Obese male in no acute distress     Lungs:      respirations unlabored  Heart:    Tachycardic  MS:   All extremities are intact.   Neurologic:  Sleeping-- will awaken and have appropriate conversation      Data Reviewed: I have personally reviewed following labs and imaging studies  CBC: Recent Labs  Lab 06/29/23 0903 06/30/23 0300 07/01/23 0309 07/02/23 0308 07/03/23 0313  WBC 9.0 7.6 8.3 8.3 11.9*  NEUTROABS  7.8*  --   --   --  10.7*  HGB 9.6* 10.2* 9.4* 9.2* 8.2*  HCT 32.1* 35.8* 33.0* 31.4* 29.6*  MCV 87.0 92.5 91.4 90.0 91.9  PLT 331 315 278 295 304   Basic Metabolic Panel: Recent Labs  Lab 06/29/23 0903 06/30/23 0300 07/01/23 0309 07/02/23 0308 07/02/23 0313 07/03/23 0313  NA 139 139 137 135  --  137  K 4.8 3.5 3.6 4.3  --  4.8  CL 94* 86* 85* 90*  --  91*  CO2 40* 40* 42* 35*  --  40*  GLUCOSE 136* 131* 112* 163*  --  153*  BUN 18 14 19 22   --  26*  CREATININE 0.96 1.19 1.02 0.70 0.76 0.95  CALCIUM  9.4 8.5* 8.5* 8.4*  --  8.4*  MG  --   --   --   --  2.2 2.3  PHOS  --   --   --   --  2.8 3.1    GFR: Estimated Creatinine Clearance: 80 mL/min (by C-G formula based on SCr of 0.95 mg/dL). Liver Function Tests: Recent Labs  Lab 06/29/23 0903 06/30/23 0300  AST 20 26  ALT 20 22  ALKPHOS 86 87  BILITOT 0.4 1.1  PROT 6.6 6.7  ALBUMIN  3.7 3.3*   No results for input(s): "LIPASE", "AMYLASE" in the last 168 hours. No results for input(s): "AMMONIA" in the last 168 hours. Coagulation Profile: No results for input(s): "INR", "PROTIME" in the last 168 hours. Cardiac Enzymes: No results for input(s): "CKTOTAL", "CKMB", "CKMBINDEX", "TROPONINI" in the last 168 hours. BNP (last 3 results) No results for input(s): "PROBNP" in the last 8760 hours. HbA1C: No results for input(s): "HGBA1C" in the last 72 hours. CBG: Recent Labs  Lab 07/02/23 0821  GLUCAP 164*   Lipid Profile: No results for input(s): "CHOL", "HDL", "LDLCALC", "TRIG", "CHOLHDL", "LDLDIRECT" in the last 72 hours. Thyroid  Function Tests: No results for input(s): "TSH", "T4TOTAL", "FREET4", "T3FREE", "THYROIDAB" in the last 72 hours. Anemia Panel: No results for input(s): "VITAMINB12", "FOLATE", "FERRITIN", "TIBC", "IRON", "RETICCTPCT" in the last 72 hours. Sepsis Labs: Recent Labs  Lab 06/30/23 0300  PROCALCITON <0.10    Recent Results (from the past 240 hours)  Expectorated Sputum Assessment w Gram Stain, Rflx to Resp Cult     Status: None   Collection Time: 06/29/23  4:34 PM   Specimen: Expectorated Sputum  Result Value Ref Range Status   Specimen Description EXPECTORATED SPUTUM  Final   Special Requests NONE  Final   Sputum evaluation   Final    THIS SPECIMEN IS ACCEPTABLE FOR SPUTUM CULTURE Performed at [facility], [address], [place], [place] [address]    Report Status 06/29/2023 FINAL  Final  Culture, Respiratory w Gram Stain     Status: None (Preliminary result)   Collection Time: 06/29/23  4:34 PM  Result Value Ref Range Status   Specimen Description   Final     EXPECTORATED SPUTUM Performed at [facility], [address], [place], [place] [address]    Special Requests   Final    NONE Reflexed from [id number] Performed at [facility], [address], [place], [place] [address]    Gram Stain   Final    ABUNDANT WBC PRESENT, PREDOMINANTLY PMN ABUNDANT GRAM NEGATIVE RODS RARE GRAM POSITIVE COCCI    Culture   Final    FEW PSEUDOMONAS AERUGINOSA SUSCEPTIBILITIES TO FOLLOW Performed at [facility], [address], [place], [place] [address]    Report Status PENDING  Incomplete  Respiratory (~20 pathogens) panel by PCR     Status: Abnormal   Collection Time: 06/29/23  8:40 PM   Specimen: Nasopharyngeal Swab; Respiratory  Result Value Ref Range Status   Adenovirus NOT DETECTED NOT DETECTED Final   Coronavirus 229E NOT DETECTED NOT DETECTED Final    Comment: (NOTE) The Coronavirus on the Respiratory Panel, DOES NOT test for the novel  Coronavirus (2019 nCoV)    Coronavirus HKU1 NOT DETECTED NOT DETECTED Final   Coronavirus NL63 NOT DETECTED NOT DETECTED Final   Coronavirus OC43 NOT DETECTED NOT DETECTED Final   Metapneumovirus NOT DETECTED NOT DETECTED Final   Rhinovirus / Enterovirus NOT DETECTED NOT DETECTED Final   Influenza A NOT DETECTED NOT DETECTED Final   Influenza B NOT DETECTED NOT DETECTED Final   Parainfluenza Virus 1 NOT DETECTED NOT DETECTED Final   Parainfluenza Virus 2 NOT DETECTED NOT DETECTED Final   Parainfluenza Virus 3 DETECTED (A) NOT DETECTED Final   Parainfluenza Virus 4 NOT DETECTED NOT DETECTED Final   Respiratory Syncytial Virus NOT DETECTED NOT DETECTED Final   Bordetella pertussis NOT DETECTED NOT DETECTED Final   Bordetella Parapertussis NOT DETECTED NOT DETECTED Final   Chlamydophila pneumoniae NOT DETECTED NOT DETECTED Final   Mycoplasma pneumoniae NOT DETECTED NOT DETECTED Final    Comment: Performed at [facility], [address], [place], [place] [address]   MRSA Next Gen by PCR, Nasal     Status: None   Collection Time: 06/29/23  8:40 PM   Specimen: Nasal Mucosa; Nasal Swab  Result Value Ref Range Status   MRSA by PCR Next Gen NOT DETECTED NOT DETECTED Final    Comment: (NOTE) The GeneXpert MRSA Assay (FDA approved for NASAL specimens only), is one component of a comprehensive MRSA colonization surveillance program. It is not intended to diagnose MRSA infection nor to guide or monitor treatment for MRSA infections. Test performance is not FDA approved in patients less than 49 years old. Performed at [facility], [address] [place], [place] [address]          Radiology Studies: No results found.       Scheduled Meds:  arformoterol   15 mcg Nebulization BID   budesonide   0.5 mg Nebulization BID   Chlorhexidine  Gluconate Cloth  6 each Topical Daily   feeding supplement  237 mL Oral BID BM   furosemide   40 mg Oral Daily   magnesium  oxide  400 mg Oral Daily   methylPREDNISolone  (SOLU-MEDROL ) injection  40 mg Intravenous Q12H   pantoprazole   40 mg Oral Daily   revefenacin   175 mcg Nebulization Daily   rivaroxaban   20 mg Oral Q supper   tamsulosin   0.4 mg Oral QHS   Continuous Infusions:  ceFEPime  (MAXIPIME ) IV Stopped (07/03/23 0851)      LOS: 3 days    Time spent: 45 minutes spent on chart review, discussion with nursing staff, consultants, updating family and interview/physical exam; more than 50% of that time was spent in counseling and/or coordination of care.    Enrigue Harvard, DO [facility] Available via Epic secure chat 7am-7pm After these hours, please refer to coverage provider listed on [email or web address] 07/03/2023, 9:56 AM

## 2023-07-04 DIAGNOSIS — J449 Chronic obstructive pulmonary disease, unspecified: Secondary | ICD-10-CM | POA: Diagnosis not present

## 2023-07-04 DIAGNOSIS — J8410 Pulmonary fibrosis, unspecified: Secondary | ICD-10-CM | POA: Diagnosis not present

## 2023-07-04 DIAGNOSIS — J9601 Acute respiratory failure with hypoxia: Secondary | ICD-10-CM | POA: Diagnosis not present

## 2023-07-04 DIAGNOSIS — J9621 Acute and chronic respiratory failure with hypoxia: Secondary | ICD-10-CM | POA: Diagnosis not present

## 2023-07-04 DIAGNOSIS — J122 Parainfluenza virus pneumonia: Secondary | ICD-10-CM | POA: Diagnosis not present

## 2023-07-04 LAB — BASIC METABOLIC PANEL WITH GFR
Anion gap: 8 (ref 5–15)
BUN: 21 mg/dL (ref 8–23)
CO2: 42 mmol/L — ABNORMAL HIGH (ref 22–32)
Calcium: 8.8 mg/dL — ABNORMAL LOW (ref 8.9–10.3)
Chloride: 90 mmol/L — ABNORMAL LOW (ref 98–111)
Creatinine, Ser: 0.89 mg/dL (ref 0.61–1.24)
GFR, Estimated: >60 mL/min (ref >60)
Glucose, Bld: 166 mg/dL — ABNORMAL HIGH (ref 70–99)
Potassium: 5.7 mmol/L — ABNORMAL HIGH (ref 3.5–5.1)
Sodium: 140 mmol/L (ref 135–145)

## 2023-07-04 LAB — CBC
HCT: 32 % — ABNORMAL LOW (ref 39.0–52.0)
Hemoglobin: 8.8 g/dL — ABNORMAL LOW (ref 13.0–17.0)
MCH: 25.4 pg — ABNORMAL LOW (ref 26.0–34.0)
MCHC: 27.5 g/dL — ABNORMAL LOW (ref 30.0–36.0)
MCV: 92.2 fL (ref 80.0–100.0)
Platelets: 323 10*3/uL (ref 150–400)
RBC: 3.47 MIL/uL — ABNORMAL LOW (ref 4.22–5.81)
RDW: 15.9 % — ABNORMAL HIGH (ref 11.5–15.5)
WBC: 11.1 10*3/uL — ABNORMAL HIGH (ref 4.0–10.5)
nRBC: 0.2 % (ref 0.0–0.2)

## 2023-07-04 MED ORDER — SODIUM ZIRCONIUM CYCLOSILICATE 5 G PO PACK
5.0000 g | PACK | Freq: Once | ORAL | Status: AC
  Administered 2023-07-04: 5 g via ORAL
  Filled 2023-07-04: qty 1

## 2023-07-04 NOTE — Progress Notes (Signed)
 PROGRESS NOTE    Philip [name]  GEX:[id number] DOB: July 21, 1955 DOA: 06/29/2023 PCP: [name], MD    Brief Narrative:  Philip [name] is a 68 y.o. 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  History is provided by the patient, as well as his sister who is at the bedside.  Chronically he is on 5 L nasal cannula oxygen, has titrated up as high as 10 L/min with any kind of exertion, even trying to sit up straight in a chair.  Been this way for many months.  He was on chemotherapy under the care of Dr. Liam Redhead, however carboplatin  was stopped after second cycle given anaphylaxis, and documentation indicates that no further chemotherapy is planned.   Slow to improve and plan for LTAC once insurance approved   Assessment and Plan: Acute on chronic hypoxic respiratory failure-he has COPD with chronic hypoxic respiratory failure as well as history of pulmonary MAI infection and known right sided pleural effusion.  He has increased sputum production and persistent cough which are atypical for him, but otherwise no significant change in his symptomatology.---- found to have parainfluenza virus -Continue supplemental oxygen, with goal O2 saturation 88 to 90% -Continue nebulized LAMA, LABA, ICS - Lasix  60 mg IV x 1 and then PO -IV steroids-wean to home dose 10 mg -on zosyn /vanc- procal negative-- d/c abx- sputum culture with few pseudomonas-- defer to PCCM for treatment - pulmonology consultation for further recommendations -wean O2 as able -LTAC consulted   Stage III lung adenocarcinoma-under the care of Dr. [name], no longer on treatment- added to treatment team -Norco as needed for pain   History of  PE -continue Xarelto  indefinitely in the setting of malignancy   BPH -Flomax    OOB to chair  DVT prophylaxis:  rivaroxaban  (XARELTO ) tablet 20 mg    Code Status: Full Code- changed overnight and he is not interested in palliative care consult Family Communication: None at bedside  Disposition Plan:  Level of care: Stepdown Status is: Inpatient   Consultants:  pccm   Subjective: Continues to complain of shortness of breath  Objective: Vitals:   07/04/23 0800 07/04/23 0808 07/04/23 0832 07/04/23 0900  BP: 112/80   127/84  Pulse: 81  81 98  Resp: 18  19 (!) 26  Temp:  97.9 F (36.6 C)    TempSrc:  Axillary    SpO2: 93%  94% (!) 85%  Weight:      Height:        Intake/Output Summary (Last 24 hours) at 07/04/2023 1011 Last data filed at 07/04/2023 0500 Gross per 24 hour  Intake 200.59 ml  Output 2175 ml  Net -1974.41 ml   Filed Weights   06/29/23 1149 06/29/23 1635 07/03/23 0439  Weight: 90 kg 90.1 kg 91.9 kg    Examination:   General: Appearance:    Obese male in no acute distress     Lungs:      respirations unlabored  Heart:    Normal heart rate  MS:   All extremities are intact.   Neurologic:  A+Ox3      Data Reviewed: I have personally reviewed following labs and imaging studies  CBC: Recent Labs  Lab 06/29/23 0903 06/30/23 0300 07/01/23  0309 07/02/23 0308 07/03/23 0313 07/04/23 0900  WBC 9.0 7.6 8.3 8.3 11.9* 11.1*  NEUTROABS 7.8*  --   --   --  10.7*  --   HGB 9.6* 10.2* 9.4* 9.2* 8.2* 8.8*  HCT 32.1* 35.8* 33.0* 31.4* 29.6* 32.0*  MCV 87.0 92.5 91.4 90.0 91.9 92.2  PLT 331 315 278 295 304 323   Basic Metabolic Panel: Recent Labs  Lab 06/30/23 0300 07/01/23 0309 07/02/23 0308 07/02/23 0313 07/03/23 0313 07/04/23 0900  NA 139 137 135  --  137 140  K 3.5 3.6 4.3  --  4.8 5.7*  CL 86* 85* 90*  --  91* 90*  CO2 40* 42* 35*  --  40* 42*  GLUCOSE 131* 112* 163*  --  153* 166*  BUN 14 19 22   --  26* 21  CREATININE 1.19 1.02 0.70  0.76 0.95 0.89  CALCIUM  8.5* 8.5* 8.4*  --  8.4* 8.8*  MG  --   --   --  2.2 2.3  --   PHOS  --   --   --  2.8 3.1  --    GFR: Estimated Creatinine Clearance: 85.4 mL/min (by C-G formula based on SCr of 0.89 mg/dL). Liver Function Tests: Recent Labs  Lab 06/29/23 0903 06/30/23 0300  AST 20 26  ALT 20 22  ALKPHOS 86 87  BILITOT 0.4 1.1  PROT 6.6 6.7  ALBUMIN  3.7 3.3*   No results for input(s): "LIPASE", "AMYLASE" in the last 168 hours. No results for input(s): "AMMONIA" in the last 168 hours. Coagulation Profile: No results for input(s): "INR", "PROTIME" in the last 168 hours. Cardiac Enzymes: No results for input(s): "CKTOTAL", "CKMB", "CKMBINDEX", "TROPONINI" in the last 168 hours. BNP (last 3 results) No results for input(s): "PROBNP" in the last 8760 hours. HbA1C: No results for input(s): "HGBA1C" in the last 72 hours. CBG: Recent Labs  Lab 07/02/23 0821  GLUCAP 164*   Lipid Profile: No results for input(s): "CHOL", "HDL", "LDLCALC", "TRIG", "CHOLHDL", "LDLDIRECT" in the last 72 hours. Thyroid  Function Tests: No results for input(s): "TSH", "T4TOTAL", "FREET4", "T3FREE", "THYROIDAB" in the last 72 hours. Anemia Panel: No results for input(s): "VITAMINB12", "FOLATE", "FERRITIN", "TIBC", "IRON", "RETICCTPCT" in the last 72 hours. Sepsis Labs: Recent Labs  Lab 06/30/23 0300  PROCALCITON <0.10    Recent Results (from the past 240 hours)  Expectorated Sputum Assessment w Gram Stain, Rflx to Resp Cult     Status: None   Collection Time: 06/29/23  4:34 PM   Specimen: Expectorated Sputum  Result Value Ref Range Status   Specimen Description EXPECTORATED SPUTUM  Final   Special Requests NONE  Final   Sputum evaluation   Final    THIS SPECIMEN IS ACCEPTABLE FOR SPUTUM CULTURE Performed at [facility], [address], [place], [place] [address]    Report Status 06/29/2023 FINAL  Final  Culture, Respiratory w Gram Stain     Status: None    Collection Time: 06/29/23  4:34 PM  Result Value Ref Range Status   Specimen Description   Final    EXPECTORATED SPUTUM Performed at [facility], [address], [place], [place] [address]    Special Requests   Final    NONE Reflexed from [id number] Performed at [facility], [address] [place], [place] [address]    Gram Stain   Final    ABUNDANT WBC PRESENT, PREDOMINANTLY PMN ABUNDANT GRAM NEGATIVE RODS RARE GRAM POSITIVE  COCCI Performed at [facility], [address], [place], [place] [address]    Culture FEW PSEUDOMONAS AERUGINOSA  Final   Report Status 07/03/2023 FINAL  Final   Organism ID, Bacteria PSEUDOMONAS AERUGINOSA  Final      Susceptibility   Pseudomonas aeruginosa - MIC*    CEFTAZIDIME 2 SENSITIVE Sensitive     CIPROFLOXACIN  <=0.25 SENSITIVE Sensitive     GENTAMICIN <=1 SENSITIVE Sensitive     IMIPENEM 1 SENSITIVE Sensitive     PIP/TAZO <=4 SENSITIVE Sensitive ug/mL    CEFEPIME  0.5 SENSITIVE Sensitive     * FEW PSEUDOMONAS AERUGINOSA  Respiratory (~20 pathogens) panel by PCR     Status: Abnormal   Collection Time: 06/29/23  8:40 PM   Specimen: Nasopharyngeal Swab; Respiratory  Result Value Ref Range Status   Adenovirus NOT DETECTED NOT DETECTED Final   Coronavirus 229E NOT DETECTED NOT DETECTED Final    Comment: (NOTE) The Coronavirus on the Respiratory Panel, DOES NOT test for the novel  Coronavirus (2019 nCoV)    Coronavirus HKU1 NOT DETECTED NOT DETECTED Final   Coronavirus NL63 NOT DETECTED NOT DETECTED Final   Coronavirus OC43 NOT DETECTED NOT DETECTED Final   Metapneumovirus NOT DETECTED NOT DETECTED Final   Rhinovirus / Enterovirus NOT DETECTED NOT DETECTED Final   Influenza A NOT DETECTED NOT DETECTED Final   Influenza B NOT DETECTED NOT DETECTED Final   Parainfluenza Virus 1 NOT DETECTED NOT DETECTED Final   Parainfluenza Virus 2 NOT DETECTED NOT DETECTED Final   Parainfluenza Virus 3 DETECTED (A) NOT  DETECTED Final   Parainfluenza Virus 4 NOT DETECTED NOT DETECTED Final   Respiratory Syncytial Virus NOT DETECTED NOT DETECTED Final   Bordetella pertussis NOT DETECTED NOT DETECTED Final   Bordetella Parapertussis NOT DETECTED NOT DETECTED Final   Chlamydophila pneumoniae NOT DETECTED NOT DETECTED Final   Mycoplasma pneumoniae NOT DETECTED NOT DETECTED Final    Comment: Performed at [facility], [address] [place], [place] [address]  MRSA Next Gen by PCR, Nasal     Status: None   Collection Time: 06/29/23  8:40 PM   Specimen: Nasal Mucosa; Nasal Swab  Result Value Ref Range Status   MRSA by PCR Next Gen NOT DETECTED NOT DETECTED Final    Comment: (NOTE) The GeneXpert MRSA Assay (FDA approved for NASAL specimens only), is one component of a comprehensive MRSA colonization surveillance program. It is not intended to diagnose MRSA infection nor to guide or monitor treatment for MRSA infections. Test performance is not FDA approved in patients less than 28 years old. Performed at [facility], [address] [place], [place] [address]          Radiology Studies: No results found.       Scheduled Meds:  arformoterol   15 mcg Nebulization BID   budesonide   0.5 mg Nebulization BID   Chlorhexidine  Gluconate Cloth  6 each Topical Daily   feeding supplement  237 mL Oral BID BM   [START ON 07/06/2023] furosemide   40 mg Oral Daily   magnesium  oxide  400 mg Oral Daily   methylPREDNISolone  (SOLU-MEDROL ) injection  40 mg Intravenous Q12H   pantoprazole   40 mg Oral Daily   polyethylene glycol  17 g Oral Daily   revefenacin   175 mcg Nebulization Daily   rivaroxaban   20 mg Oral Q supper   tamsulosin   0.4 mg Oral QHS   Continuous Infusions:  ceFEPime  (MAXIPIME ) IV 2 g (07/04/23  0911)      LOS: 4 days    Time spent: 45 minutes spent on chart review, discussion with nursing staff, consultants, updating family and interview/physical exam; more than  50% of that time was spent in counseling and/or coordination of care.    Enrigue Harvard, DO [facility] Available via Epic secure chat 7am-7pm After these hours, please refer to coverage provider listed on [email or web address] 07/04/2023, 10:11 AM

## 2023-07-04 NOTE — Plan of Care (Signed)
  Problem: Education: Goal: Knowledge of General Education information will improve Description: Including pain rating scale, medication(s)/side effects and non-pharmacologic comfort measures Outcome: Progressing   Problem: Clinical Measurements: Goal: Ability to maintain clinical measurements within normal limits will improve Outcome: Progressing Goal: Will remain free from infection Outcome: Progressing   Problem: Coping: Goal: Level of anxiety will decrease Outcome: Progressing   Problem: Clinical Measurements: Goal: Respiratory complications will improve Outcome: Not Progressing   Problem: Activity: Goal: Risk for activity intolerance will decrease Outcome: Not Progressing

## 2023-07-04 NOTE — Progress Notes (Signed)
 NAME:  Philip [name], MRN:  [id number], DOB:  1955/07/01, LOS: 4 ADMISSION DATE:  06/29/2023, CONSULTATION DATE:  06/29/2023 REFERRING MD: Jannette Mend - [facility], CHIEF COMPLAINT:  Dyspnea   History of Present Illness:  Philip [name] is a 68 y.o. 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 He is followed by Dr. Marygrace Snellen in the clinic.  He was getting a routine surveillance CT of the abdomen and pelvis on 5/27 and while at the facility, he had significant dyspnea which prompted ED evaluation. In ED, he was evaluated and admitted by the hospital service and PCCM was called in consultation for assistance with his pulmonary care.  To the pulmonary critical care team the sister Philip [name] and the patient expressed significant fear of dying, anxiety about dying, class IV dyspnea at baseline.  Requesting anxiolytic medications.  Also chronic pain on opioids.  They feel like they want relief from the symptoms in order to help her quality of life.  He is willing to try BiPAP but reports that in the past at [facility] when BiPAP was tried proper instructions were not given and is frustrated by that patient experience.  Pertinent Medical History:   Past Medical History:  Diagnosis Date   Chronic kidney disease     COPD (chronic obstructive pulmonary disease) (HCC)    Dyspnea    GERD (gastroesophageal reflux disease)    Headache    migraines   Hyperlipidemia    lung ca dx'd 12/2018   On home oxygen therapy    3 to 10 L   Pneumonia    Pulmonary embolus (HCC)    Tobacco abuse    Significant Hospital Events: Including procedures, antibiotic start and stop dates in addition to other pertinent events   5/27 - Admitted.  Interim History / Subjective:   Did have some difficulty last night Got his antianxiety medications but BiPAP was not placed for many hours until later Feels a little tired today On high flow nasal cannula, FiO2 decreased today  Patient requested code status change to FULL 5/28PM, d/w NP Bailey Lesser and daughters (at bedside)  Objective:  Blood pressure 110/89, pulse 84, temperature (!) 96.8 F (36 C), temperature source Axillary, resp. rate (!) 21, height 5\' 6"  (1.676 m), weight 91.9 kg, SpO2 96%.    FiO2 (%):  [55 %-60 %] 60 %   Intake/Output Summary (Last 24 hours) at 07/04/2023 0736 Last data filed at 07/04/2023 0500 Gross per 24 hour  Intake 300.59 ml  Output 2575 ml  Net -2274.41 ml   Filed Weights   06/29/23 1149 06/29/23 1635 07/03/23 0439  Weight: 90 kg 90.1 kg 91.9 kg   Physical Examination: General: Elderly, does not appear to be in acute distress HEENT: Moist oral mucosa Neuro: Awake alert interactive CV: S1-S2 appreciated PULM: Coarse breath sounds, remains on high flow nasal cannula  Labs/imaging personally reviewed:  CXR 5/27 > stable right effusion  Assessment & Plan:   Acute on chronic hypoxic respiratory failure Patient usually on 5 L of oxygen at baseline - Continue high flow nasal cannula - Wean to saturations greater than 89%  Acute on chronic obstructive lung disease - Is steroid-dependent at baseline - Currently on prednisone  10 mg daily  Pulmonary fibrosis  History of MAI infection Recent Pseudomonas treatment Chronic right  pleural effusion that was not unamenable to thoracentesis, POCUS did not show effusion amenable to thoracentesis  Tracheobronchomalacia  Chronic pain Anxiety  Completed empiric antibiotics for pneumonia  Continue morphine , benzodiazepines for palliation  Continue other lines of care  Myer Artis, MD [place] [facility] Pager: See Tilford Foley

## 2023-07-05 DIAGNOSIS — J9621 Acute and chronic respiratory failure with hypoxia: Secondary | ICD-10-CM | POA: Diagnosis not present

## 2023-07-05 DIAGNOSIS — J9601 Acute respiratory failure with hypoxia: Secondary | ICD-10-CM | POA: Diagnosis not present

## 2023-07-05 DIAGNOSIS — J449 Chronic obstructive pulmonary disease, unspecified: Secondary | ICD-10-CM | POA: Diagnosis not present

## 2023-07-05 DIAGNOSIS — J8410 Pulmonary fibrosis, unspecified: Secondary | ICD-10-CM | POA: Diagnosis not present

## 2023-07-05 DIAGNOSIS — J122 Parainfluenza virus pneumonia: Secondary | ICD-10-CM | POA: Diagnosis not present

## 2023-07-05 LAB — CBC
HCT: 29 % — ABNORMAL LOW (ref 39.0–52.0)
Hemoglobin: 8.3 g/dL — ABNORMAL LOW (ref 13.0–17.0)
MCH: 25.6 pg — ABNORMAL LOW (ref 26.0–34.0)
MCHC: 28.6 g/dL — ABNORMAL LOW (ref 30.0–36.0)
MCV: 89.5 fL (ref 80.0–100.0)
Platelets: 310 10*3/uL (ref 150–400)
RBC: 3.24 MIL/uL — ABNORMAL LOW (ref 4.22–5.81)
RDW: 15.7 % — ABNORMAL HIGH (ref 11.5–15.5)
WBC: 11 10*3/uL — ABNORMAL HIGH (ref 4.0–10.5)
nRBC: 0.3 % — ABNORMAL HIGH (ref 0.0–0.2)

## 2023-07-05 LAB — BASIC METABOLIC PANEL WITH GFR
Anion gap: 8 (ref 5–15)
BUN: 21 mg/dL (ref 8–23)
CO2: 37 mmol/L — ABNORMAL HIGH (ref 22–32)
Calcium: 8.6 mg/dL — ABNORMAL LOW (ref 8.9–10.3)
Chloride: 93 mmol/L — ABNORMAL LOW (ref 98–111)
Creatinine, Ser: 0.8 mg/dL (ref 0.61–1.24)
GFR, Estimated: >60 mL/min (ref >60)
Glucose, Bld: 166 mg/dL — ABNORMAL HIGH (ref 70–99)
Potassium: 4.6 mmol/L (ref 3.5–5.1)
Sodium: 138 mmol/L (ref 135–145)

## 2023-07-05 MED ORDER — GUAIFENESIN ER 600 MG PO TB12
600.0000 mg | ORAL_TABLET | Freq: Two times a day (BID) | ORAL | Status: DC
  Administered 2023-07-05 – 2023-07-07 (×5): 600 mg via ORAL
  Filled 2023-07-05 (×5): qty 1

## 2023-07-05 MED ORDER — METHYLPREDNISOLONE SODIUM SUCC 40 MG IJ SOLR
40.0000 mg | INTRAMUSCULAR | Status: DC
  Administered 2023-07-06 – 2023-07-07 (×2): 40 mg via INTRAVENOUS
  Filled 2023-07-05 (×2): qty 1

## 2023-07-05 NOTE — Telephone Encounter (Signed)
 [facility]- Did Dr. Marygrace Snellen complete this?

## 2023-07-05 NOTE — Plan of Care (Signed)
  Problem: Clinical Measurements: Goal: Ability to maintain clinical measurements within normal limits will improve Outcome: Progressing Goal: Diagnostic test results will improve Outcome: Progressing Goal: Respiratory complications will improve Outcome: Progressing Goal: Cardiovascular complication will be avoided Outcome: Progressing   Problem: Activity: Goal: Risk for activity intolerance will decrease Outcome: Progressing

## 2023-07-05 NOTE — Progress Notes (Signed)
 NAME:  Philip [name], MRN:  [id number], DOB:  06-Jul-1955, LOS: 5 ADMISSION DATE:  06/29/2023, CONSULTATION DATE:  06/29/2023 REFERRING MD: Jannette Mend - [facility], CHIEF COMPLAINT:  Dyspnea   History of Present Illness:  Philip [name] is a 68 y.o. 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 He is followed by Dr. Marygrace Snellen in the clinic.  He was getting a routine surveillance CT of the abdomen and pelvis on 5/27 and while at the facility, he had significant dyspnea which prompted ED evaluation. In ED, he was evaluated and admitted by the hospital service and PCCM was called in consultation for assistance with his pulmonary care.  To the pulmonary critical care team the sister [name] and the patient expressed significant fear of dying, anxiety about dying, class IV dyspnea at baseline.  Requesting anxiolytic medications.  Also chronic pain on opioids.  They feel like they want relief from the symptoms in order to help her quality of life.  He is willing to try BiPAP but reports that in the past at [facility] when BiPAP was tried proper instructions were not given and is frustrated by that patient experience.  Pertinent Medical History:   Past Medical History:  Diagnosis Date   Chronic kidney disease     COPD (chronic obstructive pulmonary disease) (HCC)    Dyspnea    GERD (gastroesophageal reflux disease)    Headache    migraines   Hyperlipidemia    lung ca dx'd 12/2018   On home oxygen therapy    3 to 10 L   Pneumonia    Pulmonary embolus (HCC)    Tobacco abuse    Significant Hospital Events: Including procedures, antibiotic start and stop dates in addition to other pertinent events   5/27 - Admitted.  Interim History / Subjective:   Slept well last night   Objective:  Blood pressure (!) 117/95, pulse 84, temperature 97.9 F (36.6 C), temperature source Axillary, resp. rate 17, height 5\' 6"  (1.676 m), weight 91.9 kg, SpO2 97%.    FiO2 (%):  [55 %-60 %] 60 %   Intake/Output Summary (Last 24 hours) at 07/05/2023 1119 Last data filed at 07/05/2023 0900 Gross per 24 hour  Intake 560.8 ml  Output 850 ml  Net -289.2 ml   Filed Weights   06/29/23 1149 06/29/23 1635 07/03/23 0439  Weight: 90 kg 90.1 kg 91.9 kg   Physical Examination: General: Chronically ill appearing M NAD  HEENT: HHFNC in place. NCAT  Neuro: AAOx4  CV: cap refill brisk  PULM: Coarse. Even and unlabored   Labs/imaging personally reviewed:  CXR 5/27 > stable right effusion  Assessment & Plan:    AoC hypoxic resp failure Pulm fibrosis Obstructive lung dz  Hx MAI Recent pseudomonas infection Chronic R pleural effusion not amenable to thora  Tracheobronchomalacia  -baseline 5L -completed course of cefepime   P -cont HFNC during day and BiPAP at night -needs anxiolytics to tolerate BiPAP -cont pred -delirium precautions, promote sleep hygiene      CCT na    Eston Hence MSN, AGACNP-BC [place] [facility] Amion for pager  07/05/2023, 11:19 AM

## 2023-07-05 NOTE — Progress Notes (Signed)
 PROGRESS NOTE    Philip [name]  AVW:[id number] DOB: 1955-10-23 DOA: 06/29/2023 PCP: [name], MD    Brief Narrative:  Philip [name] is a 68 y.o. 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  History is provided by the patient, as well as his sister who is at the bedside.  Chronically he is on 5 L nasal cannula oxygen, has titrated up as high as 10 L/min with any kind of exertion, even trying to sit up straight in a chair.  Been this way for many months.  He was on chemotherapy under the care of Dr. Liam Redhead, however carboplatin  was stopped after second cycle given anaphylaxis, and documentation indicates that no further chemotherapy is planned.   Slow to improve and plan for LTAC once insurance approved   Assessment and Plan: Acute on chronic hypoxic respiratory failure-he has COPD with chronic hypoxic respiratory failure as well as history of pulmonary MAI infection and known right sided pleural effusion.  He has increased sputum production and persistent cough which are atypical for him, but otherwise no significant change in his symptomatology.---- found to have parainfluenza virus -Continue supplemental oxygen, with goal O2 saturation 88 to 90% -Continue nebulized LAMA, LABA, ICS - Lasix  60 mg IV x 1 and then PO -IV steroids-wean to home dose 10 mg -on zosyn /vanc- procal negative-- d/c abx- sputum culture with few pseudomonas-- defer to PCCM for treatment - pulmonology consultation for further recommendations -wean O2 as able -LTAC consulted and continue to wait for insurance approval   Stage III lung adenocarcinoma-under the care of Dr. [name], no longer on treatment- added to treatment  team -Norco as needed for pain   History of PE -continue Xarelto  indefinitely in the setting of malignancy   BPH -Flomax    OOB to chair  DVT prophylaxis:  rivaroxaban  (XARELTO ) tablet 20 mg    Code Status: Full Code- changed from DNR and he is not interested in palliative care consult Family Communication: None at bedside  Disposition Plan:  Level of care: Stepdown Status is: Inpatient   Consultants:  pccm   Subjective: Continues to complain of shortness of breath  Objective: Vitals:   07/05/23 0428 07/05/23 0430 07/05/23 0622 07/05/23 0800  BP:      Pulse: 84     Resp: 17     Temp:    97.9 F (36.6 C)  TempSrc:    Axillary  SpO2: 97% 98% 97%   Weight:      Height:        Intake/Output Summary (Last 24 hours) at 07/05/2023 1201 Last data filed at 07/05/2023 0900 Gross per 24 hour  Intake 560.8 ml  Output 850 ml  Net -289.2 ml   Filed Weights   06/29/23 1149 06/29/23 1635 07/03/23 0439  Weight: 90 kg 90.1 kg 91.9 kg    Examination:   General: Appearance:    Obese male in no acute distress     Lungs:      respirations mildly labored as patient having cough  Heart:    Normal heart rate  MS:   All extremities are intact.   Neurologic:  A+Ox3      Data Reviewed: I have personally reviewed following labs and imaging studies  CBC: Recent Labs  Lab 06/29/23 0903 06/30/23 0300 07/01/23 0309 07/02/23 0308 07/03/23 0313 07/04/23 0900 07/05/23 0528  WBC 9.0   < > 8.3 8.3 11.9* 11.1* 11.0*  NEUTROABS 7.8*  --   --   --  10.7*  --   --   HGB 9.6*   < > 9.4* 9.2* 8.2* 8.8* 8.3*  HCT 32.1*   < > 33.0* 31.4* 29.6* 32.0* 29.0*  MCV 87.0   < > 91.4 90.0 91.9 92.2 89.5  PLT 331   < > 278 295 304 323 310   < > = values in this interval not displayed.   Basic Metabolic Panel: Recent Labs  Lab 07/01/23 0309 07/02/23 0308 07/02/23 0313 07/03/23 0313 07/04/23 0900 07/05/23 0528  NA 137 135  --  137 140 138  K 3.6 4.3  --  4.8 5.7* 4.6  CL 85* 90*   --  91* 90* 93*  CO2 42* 35*  --  40* 42* 37*  GLUCOSE 112* 163*  --  153* 166* 166*  BUN 19 22  --  26* 21 21  CREATININE 1.02 0.70 0.76 0.95 0.89 0.80  CALCIUM  8.5* 8.4*  --  8.4* 8.8* 8.6*  MG  --   --  2.2 2.3  --   --   PHOS  --   --  2.8 3.1  --   --    GFR: Estimated Creatinine Clearance: 95.1 mL/min (by C-G formula based on SCr of 0.8 mg/dL). Liver Function Tests: Recent Labs  Lab 06/29/23 0903 06/30/23 0300  AST 20 26  ALT 20 22  ALKPHOS 86 87  BILITOT 0.4 1.1  PROT 6.6 6.7  ALBUMIN  3.7 3.3*   No results for input(s): "LIPASE", "AMYLASE" in the last 168 hours. No results for input(s): "AMMONIA" in the last 168 hours. Coagulation Profile: No results for input(s): "INR", "PROTIME" in the last 168 hours. Cardiac Enzymes: No results for input(s): "CKTOTAL", "CKMB", "CKMBINDEX", "TROPONINI" in the last 168 hours. BNP (last 3 results) No results for input(s): "PROBNP" in the last 8760 hours. HbA1C: No results for input(s): "HGBA1C" in the last 72 hours. CBG: Recent Labs  Lab 07/02/23 0821  GLUCAP 164*   Lipid Profile: No results for input(s): "CHOL", "HDL", "LDLCALC", "TRIG", "CHOLHDL", "LDLDIRECT" in the last 72 hours. Thyroid  Function Tests: No results for input(s): "TSH", "T4TOTAL", "FREET4", "T3FREE", "THYROIDAB" in the last 72 hours. Anemia Panel: No results for input(s): "VITAMINB12", "FOLATE", "FERRITIN", "TIBC", "IRON", "RETICCTPCT" in the last 72 hours. Sepsis Labs: Recent Labs  Lab 06/30/23 0300  PROCALCITON <0.10    Recent Results (from the past 240 hours)  Expectorated Sputum Assessment w Gram Stain, Rflx to Resp Cult     Status: None   Collection Time: 06/29/23  4:34 PM   Specimen: Expectorated Sputum  Result Value Ref Range Status   Specimen Description EXPECTORATED SPUTUM  Final   Special Requests NONE  Final   Sputum evaluation   Final    THIS SPECIMEN IS ACCEPTABLE FOR SPUTUM CULTURE Performed at [facility], [address], [place], [place] [address]    Report Status 06/29/2023 FINAL  Final  Culture, Respiratory w Gram Stain     Status: None   Collection Time: 06/29/23  4:34 PM  Result Value Ref Range Status   Specimen Description   Final    EXPECTORATED SPUTUM Performed at [facility], [address], [place], [place] [address]    Special Requests  Final    NONE Reflexed from [id number] Performed at [facility], [address], [place], [place] [address]    Gram Stain   Final    ABUNDANT WBC PRESENT, PREDOMINANTLY PMN ABUNDANT GRAM NEGATIVE RODS RARE GRAM POSITIVE COCCI Performed at [facility], [address], [place], [place] [address]    Culture FEW PSEUDOMONAS AERUGINOSA  Final   Report Status 07/03/2023 FINAL  Final   Organism ID, Bacteria PSEUDOMONAS AERUGINOSA  Final      Susceptibility   Pseudomonas aeruginosa - MIC*    CEFTAZIDIME 2 SENSITIVE Sensitive     CIPROFLOXACIN  <=0.25 SENSITIVE Sensitive     GENTAMICIN <=1 SENSITIVE Sensitive     IMIPENEM 1 SENSITIVE Sensitive     PIP/TAZO <=4 SENSITIVE Sensitive ug/mL    CEFEPIME  0.5 SENSITIVE Sensitive     * FEW PSEUDOMONAS AERUGINOSA  Respiratory (~20 pathogens) panel by PCR     Status: Abnormal   Collection Time: 06/29/23  8:40 PM   Specimen: Nasopharyngeal Swab; Respiratory  Result Value Ref Range Status   Adenovirus NOT DETECTED NOT DETECTED Final   Coronavirus 229E NOT DETECTED NOT DETECTED Final    Comment: (NOTE) The Coronavirus on the Respiratory Panel, DOES NOT test for the novel  Coronavirus (2019 nCoV)    Coronavirus HKU1 NOT DETECTED NOT DETECTED Final   Coronavirus NL63 NOT DETECTED NOT DETECTED Final   Coronavirus OC43 NOT DETECTED NOT DETECTED Final   Metapneumovirus NOT DETECTED NOT DETECTED Final   Rhinovirus / Enterovirus NOT DETECTED NOT DETECTED Final   Influenza A NOT DETECTED NOT DETECTED Final   Influenza B NOT DETECTED NOT DETECTED Final   Parainfluenza  Virus 1 NOT DETECTED NOT DETECTED Final   Parainfluenza Virus 2 NOT DETECTED NOT DETECTED Final   Parainfluenza Virus 3 DETECTED (A) NOT DETECTED Final   Parainfluenza Virus 4 NOT DETECTED NOT DETECTED Final   Respiratory Syncytial Virus NOT DETECTED NOT DETECTED Final   Bordetella pertussis NOT DETECTED NOT DETECTED Final   Bordetella Parapertussis NOT DETECTED NOT DETECTED Final   Chlamydophila pneumoniae NOT DETECTED NOT DETECTED Final   Mycoplasma pneumoniae NOT DETECTED NOT DETECTED Final    Comment: Performed at [facility], [address] [place], [place] [address]  MRSA Next Gen by PCR, Nasal     Status: None   Collection Time: 06/29/23  8:40 PM   Specimen: Nasal Mucosa; Nasal Swab  Result Value Ref Range Status   MRSA by PCR Next Gen NOT DETECTED NOT DETECTED Final    Comment: (NOTE) The GeneXpert MRSA Assay (FDA approved for NASAL specimens only), is one component of a comprehensive MRSA colonization surveillance program. It is not intended to diagnose MRSA infection nor to guide or monitor treatment for MRSA infections. Test performance is not FDA approved in patients less than 59 years old. Performed at [facility], [address] [place], [place] [address]          Radiology Studies: No results found.       Scheduled Meds:  arformoterol   15 mcg Nebulization BID   budesonide   0.5 mg Nebulization BID   Chlorhexidine  Gluconate Cloth  6 each Topical Daily   feeding supplement  237 mL Oral BID BM   [START ON 07/06/2023] furosemide   40 mg Oral Daily   guaiFENesin   600 mg Oral BID   magnesium  oxide  400 mg Oral Daily   methylPREDNISolone  (SOLU-MEDROL ) injection  40 mg Intravenous Q12H  pantoprazole   40 mg Oral Daily   polyethylene glycol  17 g Oral Daily   revefenacin   175 mcg Nebulization Daily   rivaroxaban   20 mg Oral Q supper   tamsulosin   0.4 mg Oral QHS   Continuous Infusions:      LOS: 5 days    Time spent: 45  minutes spent on chart review, discussion with nursing staff, consultants, updating family and interview/physical exam; more than 50% of that time was spent in counseling and/or coordination of care.    Enrigue Harvard, DO [facility] Available via Epic secure chat 7am-7pm After these hours, please refer to coverage provider listed on [email or web address] 07/05/2023, 12:01 PM

## 2023-07-06 ENCOUNTER — Inpatient Hospital Stay

## 2023-07-06 ENCOUNTER — Inpatient Hospital Stay: Attending: Internal Medicine | Admitting: Internal Medicine

## 2023-07-06 DIAGNOSIS — J8410 Pulmonary fibrosis, unspecified: Secondary | ICD-10-CM | POA: Diagnosis not present

## 2023-07-06 DIAGNOSIS — J449 Chronic obstructive pulmonary disease, unspecified: Secondary | ICD-10-CM | POA: Diagnosis not present

## 2023-07-06 DIAGNOSIS — J122 Parainfluenza virus pneumonia: Secondary | ICD-10-CM | POA: Diagnosis not present

## 2023-07-06 DIAGNOSIS — J9601 Acute respiratory failure with hypoxia: Secondary | ICD-10-CM | POA: Diagnosis not present

## 2023-07-06 DIAGNOSIS — J9621 Acute and chronic respiratory failure with hypoxia: Secondary | ICD-10-CM | POA: Diagnosis not present

## 2023-07-06 LAB — CREATININE, SERUM
Creatinine, Ser: 0.61 mg/dL (ref 0.61–1.24)
GFR, Estimated: >60 mL/min (ref >60)

## 2023-07-06 NOTE — Progress Notes (Signed)
 Physical Therapy Treatment Patient Details Name: Philip [name] MRN: [id number] DOB: Jun 05, 1955 Today's Date: 07/06/2023   History of Present Illness Philip [name] is a 68 y.o. 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 He is followed by Dr. Marygrace Snellen in the clinic.  He was getting a routine surveillance CT of the abdomen and pelvis on 5/27 and while at the facility, he had significant dyspnea which prompted ED evaluation. In ED, he was evaluated and admitted by the hospital service and PCCM was called in consultation for assistance with his pulmonary care.    PT Comments  Pt seen in ICU RM# [id number] AxO x 3 pleasant and motived.  Still works full time and lives in [place] Assisted OOB went well.  Pt self able to transfer to EOB with assist for lines/tubes. Pt able to self stand.  General Gait Details: Pre gait marching in place due to [facility] and limited activity level.  Pt stood THREE times and performed marching (40 sec, 40 sec, 50sec) using walker with HR increasing to 130's and sats decreasing to mid 80% all while on 50 L/min and 60% FiO2.  Activity limited by dyspnea. Then he took a few steps to recliner.  Progress is slow.  Per chart review, Pt awaiting Insurance approval for LTAC.      If plan  is discharge home, recommend the following: A little help with walking and/or transfers;A little help with bathing/dressing/bathroom;Assistance with cooking/housework;Assist for transportation;Help with stairs or ramp for entrance   Can travel by private vehicle        Equipment Recommendations  None recommended by PT    Recommendations for Other Services       Precautions / Restrictions Precautions Precautions: Fall Precaution/Restrictions Comments: monitor O2 Restrictions Weight Bearing Restrictions Per Provider Order: No     Mobility  Bed Mobility Overal bed mobility: Modified Independent             General bed mobility comments: self able with HOB elevated and use of rail.  Assist for multiple lines/leads/tubes.    Transfers Overall transfer level: Needs assistance Equipment used: 1 person hand held assist Transfers: Sit to/from Stand Sit to Stand: Supervision           General transfer comment: Pt self able to rise with good use of hands to support self only requiring asisst for multiple lines/leads/tubes    Ambulation/Gait               General Gait Details: Pre gait marching in place due to [facility] and limited activity level.  Pt stood THREE times and performed marching (40 sec, 40 sec, 50sec) using walker with HR increasing to 130's and sats decreasing to mid 80% all while on 50 L/min and 60% FiO2.  Activity limited by dyspnea.   Stairs  Wheelchair Mobility     Tilt Bed    Modified Rankin (Stroke Patients Only)       Balance                                            Communication Communication Communication: No apparent difficulties Factors Affecting Communication: Hearing impaired  Cognition   Behavior During Therapy: WFL for tasks assessed/performed   PT - Cognitive impairments: No apparent impairments                       PT - Cognition Comments: AxO x 3 pleasant and motived.   Still works full time and lives in 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        Pertinent Vitals/Pain Pain Assessment Pain Assessment: Faces Faces Pain Scale: Hurts a little bit Pain Location: Back (chronic) Pain Descriptors / Indicators: Discomfort Pain Intervention(s): Monitored during session    Home Living                          Prior Function            PT Goals (current goals can now be found in the care plan section) Progress towards PT goals: Progressing toward goals    Frequency    Min 3X/week      PT Plan      Co-evaluation              AM-PAC PT "6 Clicks" Mobility   Outcome Measure  Help needed turning from your back to your side while in a flat bed without using bedrails?: None Help needed moving from lying on your back to sitting on the side of a flat bed without using bedrails?: None Help needed moving to and from a bed to a chair (including a wheelchair)?: None Help needed standing up from a chair using your arms (e.g., wheelchair or bedside chair)?: None Help needed to walk in hospital room?: A Lot Help needed climbing 3-5 steps with a railing? : Total 6 Click Score: 19    End of Session Equipment Utilized During Treatment: Gait belt;Oxygen Activity Tolerance: Treatment limited secondary to medical complications (Comment) Patient left: in chair;with call bell/phone within reach Nurse Communication: Mobility status PT Visit Diagnosis: Difficulty in walking, not elsewhere classified (R26.2)     Time: 4696-2952 PT Time Calculation (min) (ACUTE ONLY): 25 min  Charges:    $Therapeutic Activity: 23-37 mins PT General Charges $$ ACUTE PT VISIT: 1 Visit                     Bess Broody  PTA Acute  Rehabilitation Services Office M-F          [phone]

## 2023-07-06 NOTE — Progress Notes (Signed)
 PROGRESS NOTE    Philip [name]  WUX:[id number] DOB: 1956/01/18 DOA: 06/29/2023 PCP: [name], MD    Brief Narrative:  Philip [name] is a 68 y.o. 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  History is provided by the patient, as well as his sister who is at the bedside.  Chronically he is on 5 L nasal cannula oxygen, has titrated up as high as 10 L/min with any kind of exertion, even trying to sit up straight in a chair.  Been this way for many months.  He was on chemotherapy under the care of Dr. Liam Redhead, however carboplatin  was stopped after second cycle given anaphylaxis, and documentation indicates that no further chemotherapy is planned.  Respiratory failure suspected due to virus at this time.  Patient slow to improve and plan for LTAC once insurance approved.   Assessment and Plan: Acute on chronic hypoxic respiratory failure-he has COPD with chronic hypoxic respiratory failure as well as history of pulmonary MAI infection and known right sided pleural effusion.  He has increased sputum production and persistent cough which are atypical for him, but otherwise no significant change in his symptomatology.---- found to have parainfluenza virus -Continue supplemental oxygen, with goal O2 saturation 88 to 90% -Continue nebulized LAMA, LABA, ICS - Lasix  60 mg IV x 1 and then PO -IV steroids-wean to home dose 10 mg over next week or so -on zosyn /vanc- procal negative--sputum culture with few pseudomonas--treated - pulmonology consultation for further recommendations appreciated- signed off -wean O2 as able-- has been very slow to improve -LTAC consulted and continue to wait for insurance approval    Stage III lung adenocarcinoma-under the care of Dr. [name], no longer on treatment- added to treatment team -Norco as needed for pain   History of PE -continue Xarelto  indefinitely in the setting of malignancy   BPH -Flomax    OOB to chair  DVT prophylaxis:  rivaroxaban  (XARELTO ) tablet 20 mg    Code Status: Full Code- changed from DNR and he is not interested in palliative care consult Family Communication: None at bedside  Disposition Plan:  Level of care: Stepdown Status is: Inpatient   Consultants:  pccm  palliative care consult: Refused   Subjective: Did not sleep well overnight  Objective: Vitals:   07/06/23 0500 07/06/23 0643 07/06/23 0800 07/06/23 0900  BP: 114/81  110/76 104/75  Pulse: 92  87 (!) 110  Resp: (!) 25  (!) 22 14  Temp:   98 F (36.7 C)   TempSrc:   Axillary   SpO2: 99% 93% 95% 95%  Weight:      Height:        Intake/Output Summary (Last 24 hours) at 07/06/2023 1358 Last data filed at 07/06/2023 0845 Gross per 24 hour  Intake 840 ml  Output 400 ml  Net 440 ml   Filed Weights   06/29/23 1149 06/29/23 1635 07/03/23 0439  Weight: 90 kg 90.1 kg 91.9 kg    Examination:   General: Appearance:    Obese male in no acute distress     Lungs:      respirations mildly labored as patient having cough  Heart:    Tachycardic  MS:   All extremities are intact.  Neurologic:  A+Ox3      Data Reviewed: I have personally reviewed following labs and imaging studies  CBC: Recent Labs  Lab 07/01/23 0309 07/02/23 0308 07/03/23 0313 07/04/23 0900 07/05/23 0528  WBC 8.3 8.3 11.9* 11.1* 11.0*  NEUTROABS  --   --  10.7*  --   --   HGB 9.4* 9.2* 8.2* 8.8* 8.3*  HCT 33.0* 31.4* 29.6* 32.0* 29.0*  MCV 91.4 90.0 91.9 92.2 89.5  PLT 278 295 304 323 310   Basic Metabolic Panel: Recent Labs  Lab 07/01/23 0309 07/02/23 0308 07/02/23 0313 07/03/23 0313 07/04/23 0900 07/05/23 0528 07/06/23 0312  NA 137 135  --  137 140 138  --   K 3.6 4.3   --  4.8 5.7* 4.6  --   CL 85* 90*  --  91* 90* 93*  --   CO2 42* 35*  --  40* 42* 37*  --   GLUCOSE 112* 163*  --  153* 166* 166*  --   BUN 19 22  --  26* 21 21  --   CREATININE 1.02 0.70 0.76 0.95 0.89 0.80 0.61  CALCIUM  8.5* 8.4*  --  8.4* 8.8* 8.6*  --   MG  --   --  2.2 2.3  --   --   --   PHOS  --   --  2.8 3.1  --   --   --    GFR: Estimated Creatinine Clearance: 95.1 mL/min (by C-G formula based on SCr of 0.61 mg/dL). Liver Function Tests: Recent Labs  Lab 06/30/23 0300  AST 26  ALT 22  ALKPHOS 87  BILITOT 1.1  PROT 6.7  ALBUMIN  3.3*   No results for input(s): "LIPASE", "AMYLASE" in the last 168 hours. No results for input(s): "AMMONIA" in the last 168 hours. Coagulation Profile: No results for input(s): "INR", "PROTIME" in the last 168 hours. Cardiac Enzymes: No results for input(s): "CKTOTAL", "CKMB", "CKMBINDEX", "TROPONINI" in the last 168 hours. BNP (last 3 results) No results for input(s): "PROBNP" in the last 8760 hours. HbA1C: No results for input(s): "HGBA1C" in the last 72 hours. CBG: Recent Labs  Lab 07/02/23 0821  GLUCAP 164*   Lipid Profile: No results for input(s): "CHOL", "HDL", "LDLCALC", "TRIG", "CHOLHDL", "LDLDIRECT" in the last 72 hours. Thyroid  Function Tests: No results for input(s): "TSH", "T4TOTAL", "FREET4", "T3FREE", "THYROIDAB" in the last 72 hours. Anemia Panel: No results for input(s): "VITAMINB12", "FOLATE", "FERRITIN", "TIBC", "IRON", "RETICCTPCT" in the last 72 hours. Sepsis Labs: Recent Labs  Lab 06/30/23 0300  PROCALCITON <0.10    Recent Results (from the past 240 hours)  Expectorated Sputum Assessment w Gram Stain, Rflx to Resp Cult     Status: None   Collection Time: 06/29/23  4:34 PM   Specimen: Expectorated Sputum  Result Value Ref Range Status   Specimen Description EXPECTORATED SPUTUM  Final   Special Requests NONE  Final   Sputum evaluation   Final    THIS SPECIMEN IS ACCEPTABLE FOR SPUTUM CULTURE Performed at  [facility], [address] [place], [place] [address]    Report Status 06/29/2023 FINAL  Final  Culture, Respiratory w Gram Stain     Status: None   Collection Time: 06/29/23  4:34 PM  Result Value Ref Range Status   Specimen Description   Final    EXPECTORATED SPUTUM Performed at [facility], [address], [place], [place] [address]    Special Requests  Final    NONE Reflexed from [id number] Performed at [facility], [address], [place], [place] [address]    Gram Stain   Final    ABUNDANT WBC PRESENT, PREDOMINANTLY PMN ABUNDANT GRAM NEGATIVE RODS RARE GRAM POSITIVE COCCI Performed at [facility], [address] [place], [place] [address]    Culture FEW PSEUDOMONAS AERUGINOSA  Final   Report Status 07/03/2023 FINAL  Final   Organism ID, Bacteria PSEUDOMONAS AERUGINOSA  Final      Susceptibility   Pseudomonas aeruginosa - MIC*    CEFTAZIDIME 2 SENSITIVE Sensitive     CIPROFLOXACIN  <=0.25 SENSITIVE Sensitive     GENTAMICIN <=1 SENSITIVE Sensitive     IMIPENEM 1 SENSITIVE Sensitive     PIP/TAZO <=4 SENSITIVE Sensitive ug/mL    CEFEPIME  0.5 SENSITIVE Sensitive     * FEW PSEUDOMONAS AERUGINOSA  Respiratory (~20 pathogens) panel by PCR     Status: Abnormal   Collection Time: 06/29/23  8:40 PM   Specimen: Nasopharyngeal Swab; Respiratory  Result Value Ref Range Status   Adenovirus NOT DETECTED NOT DETECTED Final   Coronavirus 229E NOT DETECTED NOT DETECTED Final    Comment: (NOTE) The Coronavirus on the Respiratory Panel, DOES NOT test for the novel  Coronavirus (2019 nCoV)    Coronavirus HKU1 NOT DETECTED NOT DETECTED Final   Coronavirus NL63 NOT DETECTED NOT DETECTED Final   Coronavirus OC43 NOT DETECTED NOT DETECTED Final   Metapneumovirus NOT DETECTED NOT DETECTED Final   Rhinovirus / Enterovirus NOT DETECTED NOT DETECTED Final   Influenza A NOT DETECTED NOT DETECTED Final   Influenza B NOT DETECTED  NOT DETECTED Final   Parainfluenza Virus 1 NOT DETECTED NOT DETECTED Final   Parainfluenza Virus 2 NOT DETECTED NOT DETECTED Final   Parainfluenza Virus 3 DETECTED (A) NOT DETECTED Final   Parainfluenza Virus 4 NOT DETECTED NOT DETECTED Final   Respiratory Syncytial Virus NOT DETECTED NOT DETECTED Final   Bordetella pertussis NOT DETECTED NOT DETECTED Final   Bordetella Parapertussis NOT DETECTED NOT DETECTED Final   Chlamydophila pneumoniae NOT DETECTED NOT DETECTED Final   Mycoplasma pneumoniae NOT DETECTED NOT DETECTED Final    Comment: Performed at [facility], [address] [place], [place] [address]  MRSA Next Gen by PCR, Nasal     Status: None   Collection Time: 06/29/23  8:40 PM   Specimen: Nasal Mucosa; Nasal Swab  Result Value Ref Range Status   MRSA by PCR Next Gen NOT DETECTED NOT DETECTED Final    Comment: (NOTE) The GeneXpert MRSA Assay (FDA approved for NASAL specimens only), is one component of a comprehensive MRSA colonization surveillance program. It is not intended to diagnose MRSA infection nor to guide or monitor treatment for MRSA infections. Test performance is not FDA approved in patients less than 75 years old. Performed at [facility], [address] [place], [place] [address]          Radiology Studies: No results found.       Scheduled Meds:  arformoterol   15 mcg Nebulization BID   budesonide   0.5 mg Nebulization BID   Chlorhexidine  Gluconate Cloth  6 each Topical Daily   feeding supplement  237 mL Oral BID BM   furosemide   40 mg Oral Daily   guaiFENesin   600 mg Oral BID   magnesium  oxide  400 mg Oral Daily   methylPREDNISolone  (SOLU-MEDROL ) injection  40 mg Intravenous Q24H   pantoprazole   40 mg Oral Daily   polyethylene glycol  17 g Oral Daily   revefenacin   175 mcg Nebulization Daily   rivaroxaban   20 mg Oral Q supper   tamsulosin   0.4 mg Oral QHS   Continuous Infusions:      LOS: 6 days    Time  spent: 45 minutes spent on chart review, discussion with nursing staff, consultants, updating family and interview/physical exam; more than 50% of that time was spent in counseling and/or coordination of care.    Enrigue Harvard, DO [facility] Available via Epic secure chat 7am-7pm After these hours, please refer to coverage provider listed on [email or web address] 07/06/2023, 1:58 PM

## 2023-07-06 NOTE — TOC Progression Note (Signed)
 Transition of Care [facility]) - Progression Note    Patient Details  Name: Philip [name] MRN: [id number] Date of Birth: 03/17/1955  Transition of Care [facility]) CM/SW Contact  Tessie Fila, RN Phone Number: 07/06/2023, 2:17 PM  Clinical Narrative:    NCM spoke with [name] with [facility], he states insurance [name] is still pending. Awaiting approval. TOC continuing to follow.   Expected Discharge Plan: Home w Hospice Care Barriers to Discharge: Continued Medical Work up  Expected Discharge Plan and Services In-house Referral: NA Discharge Planning Services: CM Consult Post Acute Care Choice: Durable Medical Equipment, Hospice Living arrangements for the past 2 months: Single Family Home                 DME Arranged: Oxygen, Walker rolling with seat DME Agency: [employer]       HH Arranged: NA HH Agency: Other - See comment Photographer)         Social Determinants of Health (SDOH) Interventions SDOH Screenings   Food Insecurity: Patient Unable To Answer (06/30/2023)  Housing: Patient Unable To Answer (06/30/2023)  Transportation Needs: Patient Unable To Answer (06/30/2023)  Utilities: Patient Unable To Answer (06/30/2023)  Social Connections: Patient Unable To Answer (06/30/2023)  Tobacco Use: Medium Risk (06/29/2023)    Readmission Risk Interventions    05/24/2023    5:05 PM 01/22/2022   11:05 AM 01/14/2022   12:03 PM  Readmission Risk Prevention Plan  Transportation Screening Complete Complete Complete  PCP or Specialist Appt within 5-7 Days  Complete Complete  PCP or Specialist Appt within 3-5 Days Complete    Home Care Screening  Complete Complete  Medication Review (RN CM)  Complete Complete  HRI or Home Care Consult Complete    Social Work Consult for Recovery Care Planning/Counseling Complete    Palliative Care Screening Not Applicable    Medication Review Oceanographer) Referral to Pharmacy

## 2023-07-06 NOTE — Plan of Care (Signed)

## 2023-07-06 NOTE — Progress Notes (Addendum)
 NAME:  Philip YBANEZ, MRN:  [id number], DOB:  10-May-1955, LOS: 6 ADMISSION DATE:  06/29/2023, CONSULTATION DATE:  06/29/2023 REFERRING MD: Jannette Mend - TRH, CHIEF COMPLAINT:  Dyspnea   History of Present Illness:  Philip [name] is a 68 y.o. 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 He is followed by Dr. Marygrace Snellen in the clinic.  He was getting a routine surveillance CT of the abdomen and pelvis on 5/27 and while at the facility, he had significant dyspnea which prompted ED evaluation. In ED, he was evaluated and admitted by the hospital service and PCCM was called in consultation for assistance with his pulmonary care.  To the pulmonary critical care team the sister [name] and the patient expressed significant fear of dying, anxiety about dying, class IV dyspnea at baseline.  Requesting anxiolytic medications.  Also chronic pain on opioids.  They feel like they want relief from the symptoms in order to help her quality of life.  He is willing to try BiPAP but reports that in the past at [facility] campus when BiPAP was tried proper instructions were not given and is frustrated by that patient experience.  Pertinent Medical History:   Past Medical History:  Diagnosis Date   Chronic kidney disease     COPD (chronic obstructive pulmonary disease) (HCC)    Dyspnea    GERD (gastroesophageal reflux disease)    Headache    migraines   Hyperlipidemia    lung ca dx'd 12/2018   On home oxygen therapy    3 to 10 L   Pneumonia    Pulmonary embolus (HCC)    Tobacco abuse    Significant Hospital Events: Including procedures, antibiotic start and stop dates in addition to other pertinent events   5/27 - Admitted.  Interim History / Subjective:   Slept well again Working w mobility team this morning   Objective:  Blood pressure 104/75, pulse (!) 110, temperature 98 F (36.7 C), temperature source Axillary, resp. rate 14, height 5\' 6"  (1.676 m), weight 91.9 kg, SpO2 95%.    FiO2 (%):  [60 %] 60 %   Intake/Output Summary (Last 24 hours) at 07/06/2023 8295 Last data filed at 07/06/2023 0845 Gross per 24 hour  Intake 940 ml  Output 400 ml  Net 540 ml   Filed Weights   06/29/23 1149 06/29/23 1635 07/03/23 0439  Weight: 90 kg 90.1 kg 91.9 kg   Physical Examination: General: Chronically ill older adult M  HEENT: NCAT HHFNC  Neuro: AAOx4 following commands  CV: rr brisk cap refill  PULM: Coarse GI: round soft MSK: no obvious joint deformity   Labs/imaging personally reviewed:  CXR 5/27 > stable right effusion  Assessment & Plan:    AoC hypoxic resp failure Pulm fibrosis Obstructive lung disease Hx MAI Recent PsA PNA Chronic R pleural effusion, unfortunately not amenable to thora  Tracheobronchomalacia  -baseline 5L -completed course of cefepime   P -cont HFNC during day and BiPAP at night -needs anxiolytics to tolerate BiPAP -pulm hygiene, mobility  -hasn't been steroid responsive, agree w plan to taper. (May also help his anxiety to decr stroids)  -PPI -diuresing -delirium precautions, promote sleep hygiene  -I think he would benefit from LTAC -- complex resp needs that will require prolonged weaning.    D/w primary-- PCCM will sign off. Please re-engage if we can  be of further assistance or if pt clinical status  changes      CCT n/a   Eston Hence MSN, AGACNP-BC [place] Amion for pager  07/06/2023, 9:22 AM

## 2023-07-06 NOTE — Progress Notes (Signed)
   07/06/23 2259  BiPAP/CPAP/SIPAP  BiPAP/CPAP/SIPAP Pt Type Adult  BiPAP/CPAP/SIPAP V60  Mask Type Full face mask  Dentures removed? Not applicable  Mask Size Medium  Set Rate 18 breaths/min  Respiratory Rate 22 breaths/min  IPAP 12 cmH20  EPAP 5 cmH2O  FiO2 (%) 60 %  Minute Ventilation 10.1  Leak 10  Peak Inspiratory Pressure (PIP) 16  Tidal Volume (Vt) 625  Patient Home Machine No  Patient Home Mask No  Patient Home Tubing No  Auto Titrate No  Press High Alarm 45 cmH2O  Press Low Alarm 10 cmH2O  Nasal massage performed Yes  CPAP/SIPAP surface wiped down Yes  Device Plugged into RED Power Outlet Yes  Oxygen Percent 60 %

## 2023-07-07 ENCOUNTER — Institutional Professional Consult (permissible substitution) (HOSPITAL_COMMUNITY): Payer: Self-pay

## 2023-07-07 ENCOUNTER — Institutional Professional Consult (permissible substitution)
Admission: EM | Admit: 2023-07-07 | Discharge: 2023-08-11 | Disposition: A | Discharge status: Home / Self Care | Payer: Self-pay | Source: Ambulatory Visit | Attending: Internal Medicine | Admitting: Internal Medicine

## 2023-07-07 DIAGNOSIS — Z743 Need for continuous supervision: Secondary | ICD-10-CM | POA: Diagnosis not present

## 2023-07-07 DIAGNOSIS — R0602 Shortness of breath: Secondary | ICD-10-CM | POA: Diagnosis not present

## 2023-07-07 DIAGNOSIS — R0902 Hypoxemia: Secondary | ICD-10-CM | POA: Diagnosis not present

## 2023-07-07 DIAGNOSIS — J9601 Acute respiratory failure with hypoxia: Secondary | ICD-10-CM | POA: Diagnosis not present

## 2023-07-07 DIAGNOSIS — R000 Tachycardia, unspecified: Secondary | ICD-10-CM | POA: Diagnosis not present

## 2023-07-07 DIAGNOSIS — R0600 Dyspnea, unspecified: Secondary | ICD-10-CM | POA: Diagnosis present

## 2023-07-07 DIAGNOSIS — R1011 Right upper quadrant pain: Secondary | ICD-10-CM | POA: Diagnosis not present

## 2023-07-07 LAB — CREATININE, SERUM
Creatinine, Ser: 0.65 mg/dL (ref 0.61–1.24)
GFR, Estimated: >60 mL/min (ref >60)

## 2023-07-07 MED ORDER — SENNOSIDES-DOCUSATE SODIUM 8.6-50 MG PO TABS: 1.0000 | ORAL_TABLET | Freq: Every evening | ORAL | Status: AC | PRN

## 2023-07-07 MED ORDER — ONDANSETRON HCL 4 MG PO TABS: 4.0000 mg | ORAL_TABLET | Freq: Four times a day (QID) | ORAL | Status: AC | PRN

## 2023-07-07 MED ORDER — MORPHINE SULFATE (CONCENTRATE) 10 MG /0.5 ML PO SOLN: 5.0000 mg | Freq: Four times a day (QID) | ORAL | Status: AC | PRN

## 2023-07-07 MED ORDER — POLYETHYLENE GLYCOL 3350 17 G PO PACK: 17.0000 g | PACK | Freq: Every day | ORAL | Status: AC

## 2023-07-07 MED ORDER — HYDROXYZINE HCL 25 MG PO TABS: 25.0000 mg | ORAL_TABLET | Freq: Three times a day (TID) | ORAL | Status: AC | PRN

## 2023-07-07 MED ORDER — ENSURE PLUS HIGH PROTEIN PO LIQD: 237.0000 mL | Freq: Two times a day (BID) | ORAL | Status: AC

## 2023-07-07 MED ORDER — PREDNISONE 10 MG PO TABS: ORAL_TABLET | ORAL | Status: DC

## 2023-07-07 NOTE — TOC Transition Note (Signed)
 Transition of Care [facility]) - Discharge Note   Patient Details  Name: Philip [name] MRN: [id number] Date of Birth: 08-04-1955  Transition of Care [facility]) CM/SW Contact:  Tessie Fila, RN Phone Number: 07/07/2023, 11:40 AM   Clinical Narrative:    Pt will discharge to [facility]). Pt will be transported via [facility]. Pt and pt  spouse Taggert Bozzi notified of pt discharge plan and she is in agreement. Discharge packet placed at RN station and number to call report is [phone] to room 2. Floor RN made aware of number to call report. There are no other TOC needs at this time.    Final next level of care: Long Term Acute Care (LTAC) Barriers to Discharge: No Barriers Identified   Patient Goals and CMS Choice Patient states their goals for this hospitalization and ongoing recovery are:: To go to [facility] CMS Medicare.gov Compare Post Acute Care list provided to:: Other (Comment Required) (NA) Choice offered to / list presented to : NA Reliance ownership interest in [facility]provided to:: Parent NA    Discharge Placement              Patient chooses bed at: Other - please specify in the comment section below: [facility]) Patient to be transferred to facility by: [facility] Name of family member notified: Dimitrius, Steedman (Spouse)  [phone] Patient and family notified of of transfer: 07/07/23  Discharge Plan and Services Additional resources added to the After Visit Summary for   In-house Referral: NA Discharge Planning Services: CM Consult Post Acute Care Choice: Durable Medical Equipment, Hospice          DME Arranged: N/A DME Agency: NA       HH Arranged: NA HH Agency: NA        Social Drivers of Health (SDOH) Interventions SDOH Screenings   Food Insecurity: Patient Unable To Answer (06/30/2023)  Housing: Patient Unable To Answer (06/30/2023)  Transportation Needs: Patient Unable To Answer (06/30/2023)   Utilities: Patient Unable To Answer (06/30/2023)  Social Connections: Patient Unable To Answer (06/30/2023)  Tobacco Use: Medium Risk (06/29/2023)     Readmission Risk Interventions    05/24/2023    5:05 PM 01/22/2022   11:05 AM 01/14/2022   12:03 PM  Readmission Risk Prevention Plan  Transportation Screening Complete Complete Complete  PCP or Specialist Appt within 5-7 Days  Complete Complete  PCP or Specialist Appt within 3-5 Days Complete    Home Care Screening  Complete Complete  Medication Review (RN CM)  Complete Complete  HRI or Home Care Consult Complete    Social Work Consult for Recovery Care Planning/Counseling Complete    Palliative Care Screening Not Applicable    Medication Review Oceanographer) Referral to Pharmacy

## 2023-07-07 NOTE — Discharge Summary (Signed)
 Physician Discharge Summary   Patient: Philip [name] MRN: [id number] DOB: 1956/01/30  Admit date:     06/29/2023  Discharge date: 07/07/23  Discharge Physician: Ozell Blunt   PCP: [name], MD   Recommendations at discharge:  Patient to discharge to [facility]  Discharge Diagnoses: Principal Problem:   Acute respiratory failure with hypoxia [facility])    Hospital Course:  68 y.o. 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  History is provided by the patient, as well as his sister who is at the bedside.  Chronically he is on 5 L nasal cannula oxygen, has titrated up as high as 10 L/min with any kind of exertion, even trying to sit up straight in a chair.  Been this way for many months.  He was on chemotherapy under the care of Dr. Liam Redhead, however carboplatin  was stopped after second cycle given anaphylaxis, and documentation indicates that no further chemotherapy is planned.  Respiratory failure suspected due to virus at this time.   Assessment and Plan:  Acute on chronic hypoxic respiratory failure-he has COPD with chronic hypoxic respiratory failure as well as history of pulmonary MAI infection and known right sided pleural effusion.  He has increased sputum production and persistent cough which are atypical for him, but otherwise no significant change in his symptomatology.---- found to have parainfluenza virus -Continue supplemental oxygen, with goal O2 saturation 88 to 90% -Continue nebulized Stefannie Defeo, LABA, ICS - Continue p.o. Lasix  40 mg daily -IV steroids will be switched to prednisone  taper as directed -He was on IV Vanco and Zosyn , which has been stopped as per pulmonology -  pulmonology consultation for further recommendations appreciated- signed off -wean O2 as able-- has been very slow to improve -LTAC consulted and will be transferred to [facility] hospital today. -Continue morphine  as needed for severe dyspnea and pain - Continue BiPAP at bedtime  History of Pseudomonas infection-treated with antibiotics in the hospital as above   Stage III lung adenocarcinoma-under the care of Dr. [name], no longer on treatment- added to treatment team -Norco as needed for pain   History of PE -continue Xarelto  indefinitely in the setting of malignancy   BPH -Flomax         Consultants:  Procedures performed:  Disposition: Long term care facility Diet recommendation:  Discharge Diet Orders (From admission, onward)     Start     Ordered   07/07/23 0000  Diet - low sodium heart healthy        07/07/23 1029           Regular diet DISCHARGE MEDICATION: Allergies as of 07/07/2023       Reactions   Carboplatin  Anaphylaxis, Shortness Of Breath, Cough, Hypertension   On dose 8. Became short of breath. Symptom management was called. Received benadryl , solumedrol, Pepcid  and fluids. Medication discontinued.    Klonopin [clonazepam] Other (See Comments)   nervous        Medication List     TAKE these medications    arformoterol  15 MCG/2ML Nebu Commonly known as: BROVANA  Take 2 mLs (15 mcg total) by nebulization 2 (two) times daily.   budesonide  0.5 MG/2ML nebulizer solution Commonly known as: PULMICORT  Take 2 mLs (0.5 mg total) by  nebulization in the morning and at bedtime.   feeding supplement Liqd Take 237 mLs by mouth 2 (two) times daily between meals.   furosemide  40 MG tablet Commonly known as: LASIX  Take 1 tablet (40 mg total) by mouth daily.   guaiFENesin  600 MG 12 hr tablet Commonly known as: MUCINEX  Take 1 tablet (600 mg total) by mouth 2 (two) times daily. What changed:  when to take this reasons to take this    HYDROcodone -acetaminophen  5-325 MG tablet Commonly known as: NORCO/VICODIN Take 1 tablet by mouth every 12 (twelve) hours as needed for moderate pain (pain score 4-6). What changed: when to take this   hydrOXYzine  25 MG tablet Commonly known as: ATARAX  Take 1 tablet (25 mg total) by mouth 3 (three) times daily as needed for anxiety.   ipratropium-albuterol  0.5-2.5 (3) MG/3ML Soln Commonly known as: DUONEB Take 3 mLs by nebulization every 4 (four) hours as needed (Shortness of breath, wheezing). What changed: when to take this   magnesium  oxide 400 MG tablet Commonly known as: MAG-OX Take 1 tablet (400 mg total) by mouth daily.   morphine  CONCENTRATE 10 mg / 0.5 ml concentrated solution Take 0.25 mLs (5 mg total) by mouth every 6 (six) hours as needed for severe pain (pain score 7-10).   Multi Adult Gummies Chew Chew 2 tablets by mouth daily.   ondansetron  4 MG tablet Commonly known as: ZOFRAN  Take 1 tablet (4 mg total) by mouth every 6 (six) hours as needed for nausea.   pantoprazole  40 MG tablet Commonly known as: PROTONIX  TAKE 1 TABLET BY MOUTH EVERY DAY   polyethylene glycol 17 g packet Commonly known as: MIRALAX  / GLYCOLAX  Take 17 g by mouth daily. Start taking on: July 08, 2023   predniSONE  10 MG tablet Commonly known as: DELTASONE  Prednisone  40 mg po daily x 3 day then Prednisone  30 mg po daily x 3 day then Prednisone  20 mg po daily x 3 day then Continue Prednisone  10 mg daily What changed: See the new instructions.   senna-docusate 8.6-50 MG tablet Commonly known as: Senokot-S Take 1 tablet by mouth at bedtime as needed for mild constipation.   tamsulosin  0.4 MG Caps capsule Commonly known as: FLOMAX  Take 1 capsule (0.4 mg total) by mouth at bedtime.   Visine 0.025-0.3 % ophthalmic solution Generic drug: naphazoline-pheniramine Place 1 drop into both eyes daily as needed for eye irritation.   Xarelto  20 MG Tabs tablet Generic drug: rivaroxaban  Take 1  tablet (20 mg total) by mouth daily with supper.   Yupelri  175 MCG/3ML nebulizer solution Generic drug: revefenacin  Take 3 mLs (175 mcg total) by nebulization daily. What changed: when to take this        Discharge Exam: Filed Weights   06/29/23 1149 06/29/23 1635 07/03/23 0439  Weight: 90 kg 90.1 kg 91.9 kg   General-appears in no acute distress Heart-S1-S2, regular, no murmur auscultated Lungs-clear to auscultation bilaterally, no wheezing or crackles auscultated Abdomen-soft, nontender, no organomegaly Extremities-no edema in the lower extremities Neuro-alert, oriented x3, no focal deficit noted  Condition at discharge: good  The results of significant diagnostics from this hospitalization (including imaging, microbiology, ancillary and laboratory) are listed below for reference.   Imaging Studies: DG Chest Port 1 View Result Date: 07/01/2023 CLINICAL DATA:  Shortness of breath EXAM: PORTABLE CHEST 1 VIEW COMPARISON:  Chest x-ray performed Jun 29, 2023 FINDINGS: Similar appearance of perihilar scarring and blunting of the right costophrenic angle. Heart mediastinum are not significantly changed.  IMPRESSION: 1. No significant interval change. 2. Stable right pleural effusion. 3. Similar appearance of perihilar scarring. Electronically Signed   By: Reagan Camera M.D.   On: 07/01/2023 09:01   DG Chest Port 1 View Result Date: 06/29/2023 CLINICAL DATA:  sob EXAM: PORTABLE CHEST - 1 VIEW COMPARISON:  05/24/2023 FINDINGS: Perihilar scarring right greater than left, slightly increased. Stable moderate right pleural effusion with lateral thickening or loculation. Heart size and within normal limits. Visualized bones unremarkable. Interval PICC line removal. IMPRESSION: 1. Slight increase in perihilar scarring. 2. Stable right pleural effusion. Electronically Signed   By: Nicoletta Barrier M.D.   On: 06/29/2023 13:16   CT CHEST ABDOMEN PELVIS W CONTRAST Result Date: 06/29/2023 CLINICAL DATA:   Non-small cell lung carcinoma, staging post treatment. Cough, shortness of breath EXAM: CT CHEST, ABDOMEN, AND PELVIS WITH CONTRAST TECHNIQUE: Multidetector CT imaging of the chest, abdomen and pelvis was performed following the standard protocol during bolus administration of intravenous contrast. RADIATION DOSE REDUCTION: This exam was performed according to the departmental dose-optimization program which includes automated exposure control, adjustment of the mA and/or kV according to patient size and/or use of iterative reconstruction technique. CONTRAST:  OMNIPAQUE  IOHEXOL  300 MG/ML  SOLN COMPARISON:  05/25/2023 and previous FINDINGS: CT CHEST FINDINGS Cardiovascular: Heart size normal. Trace pericardial fluid. 3-vessel coronary calcifications. Minimal calcified aortic plaque. Mediastinum/Nodes: Subcentimeter anterior mediastinal and subcarinal lymph nodes. No hilar adenopathy. Lungs/Pleura: Persistent small moderate right pleural effusion with mild smooth pleural thickening at the base. No pneumothorax. Advanced emphysema. Stable pericardial scarring/fibrosis right worse than left. No new infiltrate. Musculoskeletal: Vertebral endplate spurring at multiple levels in the lower thoracic spine. CT ABDOMEN PELVIS FINDINGS Hepatobiliary: No focal liver abnormality is seen. No gallstones, gallbladder wall thickening, or biliary dilatation. Pancreas: Unremarkable. No pancreatic ductal dilatation or surrounding inflammatory changes. Spleen: Normal in size without focal abnormality. Adrenals/Urinary Tract: 3 cm left adrenal adenoma, previously 2.4 cm on 06/16/2019. Symmetric renal parenchymal enhancement, without focal lesion. Right double-J ureteral stent in expected location, without hydronephrosis. Urinary bladder partially distended. Stomach/Bowel: Stomach is nondistended. Small bowel decompressed. Appendix not localized. The colon is partially distended, without acute finding. Vascular/Lymphatic: Scattered  aortoiliac calcified atheromatous plaque. Portal vein patent. No abdominal or pelvic adenopathy. Reproductive: Mild prostate enlargement. Other: Pelvic phleboliths.  No ascites.  No free air. Musculoskeletal: Degenerative disc disease and facet DJD L5-S1. IMPRESSION: 1. Persistent small to moderate right pleural effusion with mild smooth pleural thickening at the base. 2. No new or progressive findings. 3. Right double-J ureteral stent in expected location, without hydronephrosis. 4. Aortic Atherosclerosis (ICD10-I70.0) and Emphysema (ICD10-J43.9). Electronically Signed   By: Nicoletta Barrier M.D.   On: 06/29/2023 13:15    Microbiology: Results for orders placed or performed during the hospital encounter of 06/29/23  Expectorated Sputum Assessment w Gram Stain, Rflx to Resp Cult     Status: None   Collection Time: 06/29/23  4:34 PM   Specimen: Expectorated Sputum  Result Value Ref Range Status   Specimen Description EXPECTORATED SPUTUM  Final   Special Requests NONE  Final   Sputum evaluation   Final    THIS SPECIMEN IS ACCEPTABLE FOR SPUTUM CULTURE Performed at [facility], [address] [place], [place] [address]    Report Status 06/29/2023 FINAL  Final  Culture, Respiratory w Gram Stain     Status: None   Collection Time: 06/29/23  4:34 PM  Result Value Ref Range Status   Specimen Description  Final    EXPECTORATED SPUTUM Performed at [facility], [address], [place], [place] [address]    Special Requests   Final    NONE Reflexed from [id number] Performed at [facility], [address] [place], [place] [address]    Gram Stain   Final    ABUNDANT WBC PRESENT, PREDOMINANTLY PMN ABUNDANT GRAM NEGATIVE RODS RARE GRAM POSITIVE COCCI Performed at [facility], [address], [place], [place] [address]    Culture FEW PSEUDOMONAS AERUGINOSA  Final   Report Status 07/03/2023 FINAL  Final   Organism ID, Bacteria PSEUDOMONAS  AERUGINOSA  Final      Susceptibility   Pseudomonas aeruginosa - MIC*    CEFTAZIDIME 2 SENSITIVE Sensitive     CIPROFLOXACIN  <=0.25 SENSITIVE Sensitive     GENTAMICIN <=1 SENSITIVE Sensitive     IMIPENEM 1 SENSITIVE Sensitive     PIP/TAZO <=4 SENSITIVE Sensitive ug/mL    CEFEPIME  0.5 SENSITIVE Sensitive     * FEW PSEUDOMONAS AERUGINOSA  Respiratory (~20 pathogens) panel by PCR     Status: Abnormal   Collection Time: 06/29/23  8:40 PM   Specimen: Nasopharyngeal Swab; Respiratory  Result Value Ref Range Status   Adenovirus NOT DETECTED NOT DETECTED Final   Coronavirus 229E NOT DETECTED NOT DETECTED Final    Comment: (NOTE) The Coronavirus on the Respiratory Panel, DOES NOT test for the novel  Coronavirus (2019 nCoV)    Coronavirus HKU1 NOT DETECTED NOT DETECTED Final   Coronavirus NL63 NOT DETECTED NOT DETECTED Final   Coronavirus OC43 NOT DETECTED NOT DETECTED Final   Metapneumovirus NOT DETECTED NOT DETECTED Final   Rhinovirus / Enterovirus NOT DETECTED NOT DETECTED Final   Influenza A NOT DETECTED NOT DETECTED Final   Influenza B NOT DETECTED NOT DETECTED Final   Parainfluenza Virus 1 NOT DETECTED NOT DETECTED Final   Parainfluenza Virus 2 NOT DETECTED NOT DETECTED Final   Parainfluenza Virus 3 DETECTED (A) NOT DETECTED Final   Parainfluenza Virus 4 NOT DETECTED NOT DETECTED Final   Respiratory Syncytial Virus NOT DETECTED NOT DETECTED Final   Bordetella pertussis NOT DETECTED NOT DETECTED Final   Bordetella Parapertussis NOT DETECTED NOT DETECTED Final   Chlamydophila pneumoniae NOT DETECTED NOT DETECTED Final   Mycoplasma pneumoniae NOT DETECTED NOT DETECTED Final    Comment: Performed at [facility], [address] [place], [place] [address]  MRSA Next Gen by PCR, Nasal     Status: None   Collection Time: 06/29/23  8:40 PM   Specimen: Nasal Mucosa; Nasal Swab  Result Value Ref Range Status   MRSA by PCR Next Gen NOT DETECTED NOT DETECTED Final    Comment:  (NOTE) The GeneXpert MRSA Assay (FDA approved for NASAL specimens only), is one component of a comprehensive MRSA colonization surveillance program. It is not intended to diagnose MRSA infection nor to guide or monitor treatment for MRSA infections. Test performance is not FDA approved in patients less than 23 years old. Performed at [facility], [address], [place], [place] [address]     Labs: CBC: Recent Labs  Lab 07/01/23 0309 07/02/23 0308 07/03/23 0313 07/04/23 0900 07/05/23 0528  WBC 8.3 8.3 11.9* 11.1* 11.0*  NEUTROABS  --   --  10.7*  --   --   HGB 9.4* 9.2* 8.2* 8.8* 8.3*  HCT 33.0* 31.4* 29.6* 32.0* 29.0*  MCV 91.4 90.0 91.9 92.2 89.5  PLT 278 295 304 323  310   Basic Metabolic Panel: Recent Labs  Lab 07/01/23 0309 07/02/23 0308 07/02/23 0313 07/03/23 0313 07/04/23 0900 07/05/23 0528 07/06/23 0312 07/07/23 0316  NA 137 135  --  137 140 138  --   --   K 3.6 4.3  --  4.8 5.7* 4.6  --   --   CL 85* 90*  --  91* 90* 93*  --   --   CO2 42* 35*  --  40* 42* 37*  --   --   GLUCOSE 112* 163*  --  153* 166* 166*  --   --   BUN 19 22  --  26* 21 21  --   --   CREATININE 1.02 0.70 0.76 0.95 0.89 0.80 0.61 0.65  CALCIUM  8.5* 8.4*  --  8.4* 8.8* 8.6*  --   --   MG  --   --  2.2 2.3  --   --   --   --   PHOS  --   --  2.8 3.1  --   --   --   --    Liver Function Tests: No results for input(s): "AST", "ALT", "ALKPHOS", "BILITOT", "PROT", "ALBUMIN " in the last 168 hours. CBG: Recent Labs  Lab 07/02/23 0821  GLUCAP 164*    Discharge time spent: greater than 30 minutes.  Signed: Ozell Blunt, MD [facility] 07/07/2023

## 2023-07-08 DIAGNOSIS — J449 Chronic obstructive pulmonary disease, unspecified: Secondary | ICD-10-CM

## 2023-07-08 DIAGNOSIS — I2694 Multiple subsegmental pulmonary emboli without acute cor pulmonale: Secondary | ICD-10-CM

## 2023-07-08 DIAGNOSIS — J9621 Acute and chronic respiratory failure with hypoxia: Secondary | ICD-10-CM

## 2023-07-08 DIAGNOSIS — C3491 Malignant neoplasm of unspecified part of right bronchus or lung: Secondary | ICD-10-CM

## 2023-07-08 DIAGNOSIS — G894 Chronic pain syndrome: Secondary | ICD-10-CM

## 2023-07-08 LAB — COMPREHENSIVE METABOLIC PANEL WITH GFR
ALT: 28 U/L (ref 0–44)
AST: 21 U/L (ref 15–41)
Albumin: 2.9 g/dL — ABNORMAL LOW (ref 3.5–5.0)
Alkaline Phosphatase: 61 U/L (ref 38–126)
Anion gap: 8 (ref 5–15)
BUN: 20 mg/dL (ref 8–23)
CO2: 38 mmol/L — ABNORMAL HIGH (ref 22–32)
Calcium: 8.8 mg/dL — ABNORMAL LOW (ref 8.9–10.3)
Chloride: 94 mmol/L — ABNORMAL LOW (ref 98–111)
Creatinine, Ser: 0.88 mg/dL (ref 0.61–1.24)
GFR, Estimated: >60 mL/min (ref >60)
Glucose, Bld: 121 mg/dL — ABNORMAL HIGH (ref 70–99)
Potassium: 4.5 mmol/L (ref 3.5–5.1)
Sodium: 140 mmol/L (ref 135–145)
Total Bilirubin: 0.7 mg/dL (ref 0.0–1.2)
Total Protein: 5.9 g/dL — ABNORMAL LOW (ref 6.5–8.1)

## 2023-07-08 LAB — CBC WITH DIFFERENTIAL/PLATELET
Abs Immature Granulocytes: 0.48 K/uL — ABNORMAL HIGH (ref 0.00–0.07)
Basophils Absolute: 0 K/uL (ref 0.0–0.1)
Basophils Relative: 0 %
Eosinophils Absolute: 0 K/uL (ref 0.0–0.5)
Eosinophils Relative: 0 %
HCT: 32.2 % — ABNORMAL LOW (ref 39.0–52.0)
Hemoglobin: 9.5 g/dL — ABNORMAL LOW (ref 13.0–17.0)
Immature Granulocytes: 4 %
Lymphocytes Relative: 5 %
Lymphs Abs: 0.6 K/uL — ABNORMAL LOW (ref 0.7–4.0)
MCH: 25.5 pg — ABNORMAL LOW (ref 26.0–34.0)
MCHC: 29.5 g/dL — ABNORMAL LOW (ref 30.0–36.0)
MCV: 86.3 fL (ref 80.0–100.0)
Monocytes Absolute: 0.9 K/uL (ref 0.1–1.0)
Monocytes Relative: 8 %
Neutro Abs: 9.5 K/uL — ABNORMAL HIGH (ref 1.7–7.7)
Neutrophils Relative %: 83 %
Platelets: 374 K/uL (ref 150–400)
RBC: 3.73 MIL/uL — ABNORMAL LOW (ref 4.22–5.81)
RDW: 16.1 % — ABNORMAL HIGH (ref 11.5–15.5)
WBC: 11.6 K/uL — ABNORMAL HIGH (ref 4.0–10.5)
nRBC: 0.3 % — ABNORMAL HIGH (ref 0.0–0.2)

## 2023-07-08 LAB — PROTIME-INR
INR: 1.2 (ref 0.8–1.2)
Prothrombin Time: 15.2 s (ref 11.4–15.2)

## 2023-07-08 LAB — MAGNESIUM: Magnesium: 2.2 mg/dL (ref 1.7–2.4)

## 2023-07-09 DIAGNOSIS — G894 Chronic pain syndrome: Secondary | ICD-10-CM

## 2023-07-09 DIAGNOSIS — I2694 Multiple subsegmental pulmonary emboli without acute cor pulmonale: Secondary | ICD-10-CM

## 2023-07-09 DIAGNOSIS — J9621 Acute and chronic respiratory failure with hypoxia: Secondary | ICD-10-CM

## 2023-07-09 DIAGNOSIS — C3491 Malignant neoplasm of unspecified part of right bronchus or lung: Secondary | ICD-10-CM

## 2023-07-09 DIAGNOSIS — J449 Chronic obstructive pulmonary disease, unspecified: Secondary | ICD-10-CM

## 2023-07-10 DIAGNOSIS — J449 Chronic obstructive pulmonary disease, unspecified: Secondary | ICD-10-CM

## 2023-07-10 DIAGNOSIS — C3491 Malignant neoplasm of unspecified part of right bronchus or lung: Secondary | ICD-10-CM

## 2023-07-10 DIAGNOSIS — J9621 Acute and chronic respiratory failure with hypoxia: Secondary | ICD-10-CM

## 2023-07-10 DIAGNOSIS — G894 Chronic pain syndrome: Secondary | ICD-10-CM

## 2023-07-10 DIAGNOSIS — I2694 Multiple subsegmental pulmonary emboli without acute cor pulmonale: Secondary | ICD-10-CM

## 2023-07-11 DIAGNOSIS — J449 Chronic obstructive pulmonary disease, unspecified: Secondary | ICD-10-CM

## 2023-07-11 DIAGNOSIS — I2694 Multiple subsegmental pulmonary emboli without acute cor pulmonale: Secondary | ICD-10-CM

## 2023-07-11 DIAGNOSIS — J9621 Acute and chronic respiratory failure with hypoxia: Secondary | ICD-10-CM

## 2023-07-11 DIAGNOSIS — C3491 Malignant neoplasm of unspecified part of right bronchus or lung: Secondary | ICD-10-CM

## 2023-07-11 DIAGNOSIS — G894 Chronic pain syndrome: Secondary | ICD-10-CM

## 2023-07-12 DIAGNOSIS — J9621 Acute and chronic respiratory failure with hypoxia: Secondary | ICD-10-CM

## 2023-07-12 DIAGNOSIS — J449 Chronic obstructive pulmonary disease, unspecified: Secondary | ICD-10-CM

## 2023-07-12 DIAGNOSIS — G894 Chronic pain syndrome: Secondary | ICD-10-CM

## 2023-07-12 DIAGNOSIS — C3491 Malignant neoplasm of unspecified part of right bronchus or lung: Secondary | ICD-10-CM

## 2023-07-12 DIAGNOSIS — I2694 Multiple subsegmental pulmonary emboli without acute cor pulmonale: Secondary | ICD-10-CM

## 2023-07-13 DIAGNOSIS — J449 Chronic obstructive pulmonary disease, unspecified: Secondary | ICD-10-CM

## 2023-07-13 DIAGNOSIS — I2694 Multiple subsegmental pulmonary emboli without acute cor pulmonale: Secondary | ICD-10-CM

## 2023-07-13 DIAGNOSIS — G894 Chronic pain syndrome: Secondary | ICD-10-CM

## 2023-07-13 DIAGNOSIS — J9621 Acute and chronic respiratory failure with hypoxia: Secondary | ICD-10-CM

## 2023-07-13 DIAGNOSIS — C3491 Malignant neoplasm of unspecified part of right bronchus or lung: Secondary | ICD-10-CM

## 2023-07-14 DIAGNOSIS — J449 Chronic obstructive pulmonary disease, unspecified: Secondary | ICD-10-CM

## 2023-07-14 DIAGNOSIS — C3491 Malignant neoplasm of unspecified part of right bronchus or lung: Secondary | ICD-10-CM

## 2023-07-14 DIAGNOSIS — J9621 Acute and chronic respiratory failure with hypoxia: Secondary | ICD-10-CM

## 2023-07-14 DIAGNOSIS — I2694 Multiple subsegmental pulmonary emboli without acute cor pulmonale: Secondary | ICD-10-CM

## 2023-07-14 DIAGNOSIS — G894 Chronic pain syndrome: Secondary | ICD-10-CM

## 2023-07-15 DIAGNOSIS — J9621 Acute and chronic respiratory failure with hypoxia: Secondary | ICD-10-CM

## 2023-07-15 DIAGNOSIS — J449 Chronic obstructive pulmonary disease, unspecified: Secondary | ICD-10-CM

## 2023-07-15 DIAGNOSIS — G894 Chronic pain syndrome: Secondary | ICD-10-CM

## 2023-07-15 DIAGNOSIS — C3491 Malignant neoplasm of unspecified part of right bronchus or lung: Secondary | ICD-10-CM

## 2023-07-15 DIAGNOSIS — I2694 Multiple subsegmental pulmonary emboli without acute cor pulmonale: Secondary | ICD-10-CM

## 2023-07-16 ENCOUNTER — Institutional Professional Consult (permissible substitution) (HOSPITAL_COMMUNITY): Payer: Self-pay

## 2023-07-16 DIAGNOSIS — C3491 Malignant neoplasm of unspecified part of right bronchus or lung: Secondary | ICD-10-CM

## 2023-07-16 DIAGNOSIS — I2694 Multiple subsegmental pulmonary emboli without acute cor pulmonale: Secondary | ICD-10-CM

## 2023-07-16 DIAGNOSIS — J449 Chronic obstructive pulmonary disease, unspecified: Secondary | ICD-10-CM

## 2023-07-16 DIAGNOSIS — J9621 Acute and chronic respiratory failure with hypoxia: Secondary | ICD-10-CM

## 2023-07-16 DIAGNOSIS — G894 Chronic pain syndrome: Secondary | ICD-10-CM

## 2023-07-16 LAB — BASIC METABOLIC PANEL WITH GFR
Anion gap: 9 (ref 5–15)
BUN: 21 mg/dL (ref 8–23)
CO2: 34 mmol/L — ABNORMAL HIGH (ref 22–32)
Calcium: 8.4 mg/dL — ABNORMAL LOW (ref 8.9–10.3)
Chloride: 96 mmol/L — ABNORMAL LOW (ref 98–111)
Creatinine, Ser: 0.84 mg/dL (ref 0.61–1.24)
GFR, Estimated: >60 mL/min (ref >60)
Glucose, Bld: 187 mg/dL — ABNORMAL HIGH (ref 70–99)
Potassium: 4.3 mmol/L (ref 3.5–5.1)
Sodium: 139 mmol/L (ref 135–145)

## 2023-07-16 LAB — CBC
HCT: 32.5 % — ABNORMAL LOW (ref 39.0–52.0)
Hemoglobin: 9.4 g/dL — ABNORMAL LOW (ref 13.0–17.0)
MCH: 24.9 pg — ABNORMAL LOW (ref 26.0–34.0)
MCHC: 28.9 g/dL — ABNORMAL LOW (ref 30.0–36.0)
MCV: 86 fL (ref 80.0–100.0)
Platelets: 326 10*3/uL (ref 150–400)
RBC: 3.78 MIL/uL — ABNORMAL LOW (ref 4.22–5.81)
RDW: 16.8 % — ABNORMAL HIGH (ref 11.5–15.5)
WBC: 19.2 10*3/uL — ABNORMAL HIGH (ref 4.0–10.5)
nRBC: 0 % (ref 0.0–0.2)

## 2023-07-16 LAB — MAGNESIUM: Magnesium: 2.2 mg/dL (ref 1.7–2.4)

## 2023-07-16 LAB — BRAIN NATRIURETIC PEPTIDE: B Natriuretic Peptide: 259 pg/mL — ABNORMAL HIGH (ref 0.0–100.0)

## 2023-07-17 DIAGNOSIS — J9621 Acute and chronic respiratory failure with hypoxia: Secondary | ICD-10-CM

## 2023-07-17 DIAGNOSIS — J449 Chronic obstructive pulmonary disease, unspecified: Secondary | ICD-10-CM

## 2023-07-17 DIAGNOSIS — I2694 Multiple subsegmental pulmonary emboli without acute cor pulmonale: Secondary | ICD-10-CM

## 2023-07-17 DIAGNOSIS — G894 Chronic pain syndrome: Secondary | ICD-10-CM

## 2023-07-17 DIAGNOSIS — C3491 Malignant neoplasm of unspecified part of right bronchus or lung: Secondary | ICD-10-CM

## 2023-07-18 DIAGNOSIS — C3491 Malignant neoplasm of unspecified part of right bronchus or lung: Secondary | ICD-10-CM

## 2023-07-18 DIAGNOSIS — G894 Chronic pain syndrome: Secondary | ICD-10-CM

## 2023-07-18 DIAGNOSIS — J9621 Acute and chronic respiratory failure with hypoxia: Secondary | ICD-10-CM

## 2023-07-18 DIAGNOSIS — I2694 Multiple subsegmental pulmonary emboli without acute cor pulmonale: Secondary | ICD-10-CM

## 2023-07-18 DIAGNOSIS — J449 Chronic obstructive pulmonary disease, unspecified: Secondary | ICD-10-CM

## 2023-07-19 DIAGNOSIS — J9621 Acute and chronic respiratory failure with hypoxia: Secondary | ICD-10-CM

## 2023-07-19 DIAGNOSIS — J449 Chronic obstructive pulmonary disease, unspecified: Secondary | ICD-10-CM

## 2023-07-19 DIAGNOSIS — G894 Chronic pain syndrome: Secondary | ICD-10-CM

## 2023-07-19 DIAGNOSIS — C3491 Malignant neoplasm of unspecified part of right bronchus or lung: Secondary | ICD-10-CM

## 2023-07-19 DIAGNOSIS — I2694 Multiple subsegmental pulmonary emboli without acute cor pulmonale: Secondary | ICD-10-CM

## 2023-07-19 LAB — CBC
HCT: 31.3 % — ABNORMAL LOW (ref 39.0–52.0)
Hemoglobin: 9.1 g/dL — ABNORMAL LOW (ref 13.0–17.0)
MCH: 24.7 pg — ABNORMAL LOW (ref 26.0–34.0)
MCHC: 29.1 g/dL — ABNORMAL LOW (ref 30.0–36.0)
MCV: 84.8 fL (ref 80.0–100.0)
Platelets: 244 10*3/uL (ref 150–400)
RBC: 3.69 MIL/uL — ABNORMAL LOW (ref 4.22–5.81)
RDW: 16.7 % — ABNORMAL HIGH (ref 11.5–15.5)
WBC: 13.4 10*3/uL — ABNORMAL HIGH (ref 4.0–10.5)
nRBC: 0.2 % (ref 0.0–0.2)

## 2023-07-19 LAB — COMPREHENSIVE METABOLIC PANEL WITH GFR
ALT: 29 U/L (ref 0–44)
AST: 20 U/L (ref 15–41)
Albumin: 2.7 g/dL — ABNORMAL LOW (ref 3.5–5.0)
Alkaline Phosphatase: 62 U/L (ref 38–126)
Anion gap: 10 (ref 5–15)
BUN: 17 mg/dL (ref 8–23)
CO2: 34 mmol/L — ABNORMAL HIGH (ref 22–32)
Calcium: 8.3 mg/dL — ABNORMAL LOW (ref 8.9–10.3)
Chloride: 93 mmol/L — ABNORMAL LOW (ref 98–111)
Creatinine, Ser: 0.72 mg/dL (ref 0.61–1.24)
GFR, Estimated: >60 mL/min (ref >60)
Glucose, Bld: 185 mg/dL — ABNORMAL HIGH (ref 70–99)
Potassium: 4.5 mmol/L (ref 3.5–5.1)
Sodium: 137 mmol/L (ref 135–145)
Total Bilirubin: 0.6 mg/dL (ref 0.0–1.2)
Total Protein: 5.2 g/dL — ABNORMAL LOW (ref 6.5–8.1)

## 2023-07-19 NOTE — Telephone Encounter (Signed)
 Front, please advise if updated FMLA forms were received?

## 2023-07-19 NOTE — Telephone Encounter (Signed)
 In looking at the chain the FMLA was submitted. There was some questions not answered  on the form so it was resubmitted and fax'd to the sender. Now we have a completely new, unfinished form that needs to be filled out again. I will put it in his color coded envelope as soon as it is returned to the front with CMN's that should have been signed. The front desk has been to the pod several times and can not find this folder.

## 2023-07-20 DIAGNOSIS — C3491 Malignant neoplasm of unspecified part of right bronchus or lung: Secondary | ICD-10-CM

## 2023-07-20 DIAGNOSIS — I2694 Multiple subsegmental pulmonary emboli without acute cor pulmonale: Secondary | ICD-10-CM

## 2023-07-20 DIAGNOSIS — J449 Chronic obstructive pulmonary disease, unspecified: Secondary | ICD-10-CM

## 2023-07-20 DIAGNOSIS — J9621 Acute and chronic respiratory failure with hypoxia: Secondary | ICD-10-CM

## 2023-07-20 DIAGNOSIS — G894 Chronic pain syndrome: Secondary | ICD-10-CM

## 2023-07-21 DIAGNOSIS — J449 Chronic obstructive pulmonary disease, unspecified: Secondary | ICD-10-CM

## 2023-07-21 DIAGNOSIS — J9621 Acute and chronic respiratory failure with hypoxia: Secondary | ICD-10-CM

## 2023-07-21 DIAGNOSIS — G894 Chronic pain syndrome: Secondary | ICD-10-CM

## 2023-07-21 DIAGNOSIS — C3491 Malignant neoplasm of unspecified part of right bronchus or lung: Secondary | ICD-10-CM

## 2023-07-21 NOTE — Telephone Encounter (Signed)
 I have spoken with Velta Gianotti, and she looked for new form and does not have this up front. She stated that she has already sent back to Dr Marygrace Snellen to fill this out again. I went to C pod and searched Dr Hunsucker's cabinet and do not see the forms at all. I never seen the form, so not sure how it differs from the one that was already filled out and scanned under media. Margie, did you ever see the forms at all?

## 2023-07-22 ENCOUNTER — Encounter: Payer: Self-pay | Admitting: Physician Assistant

## 2023-07-22 ENCOUNTER — Encounter: Payer: Self-pay | Admitting: Internal Medicine

## 2023-07-22 DIAGNOSIS — J9621 Acute and chronic respiratory failure with hypoxia: Secondary | ICD-10-CM

## 2023-07-22 DIAGNOSIS — C3491 Malignant neoplasm of unspecified part of right bronchus or lung: Secondary | ICD-10-CM

## 2023-07-22 DIAGNOSIS — J449 Chronic obstructive pulmonary disease, unspecified: Secondary | ICD-10-CM

## 2023-07-22 DIAGNOSIS — G894 Chronic pain syndrome: Secondary | ICD-10-CM

## 2023-07-22 NOTE — Telephone Encounter (Signed)
 I'm unable to locate forms.  I have spoken to patient. He stated that he is currently admitted. He has a copy of FMLA form and he will attempt to upload copy via mychart.

## 2023-07-23 ENCOUNTER — Institutional Professional Consult (permissible substitution) (HOSPITAL_COMMUNITY): Payer: Self-pay

## 2023-07-23 DIAGNOSIS — J449 Chronic obstructive pulmonary disease, unspecified: Secondary | ICD-10-CM

## 2023-07-23 DIAGNOSIS — C3491 Malignant neoplasm of unspecified part of right bronchus or lung: Secondary | ICD-10-CM

## 2023-07-23 DIAGNOSIS — G894 Chronic pain syndrome: Secondary | ICD-10-CM

## 2023-07-23 DIAGNOSIS — J9621 Acute and chronic respiratory failure with hypoxia: Secondary | ICD-10-CM

## 2023-07-23 DIAGNOSIS — I2694 Multiple subsegmental pulmonary emboli without acute cor pulmonale: Secondary | ICD-10-CM

## 2023-07-24 DIAGNOSIS — G894 Chronic pain syndrome: Secondary | ICD-10-CM

## 2023-07-24 DIAGNOSIS — I2694 Multiple subsegmental pulmonary emboli without acute cor pulmonale: Secondary | ICD-10-CM

## 2023-07-24 DIAGNOSIS — J9621 Acute and chronic respiratory failure with hypoxia: Secondary | ICD-10-CM

## 2023-07-24 DIAGNOSIS — C3491 Malignant neoplasm of unspecified part of right bronchus or lung: Secondary | ICD-10-CM

## 2023-07-24 DIAGNOSIS — J449 Chronic obstructive pulmonary disease, unspecified: Secondary | ICD-10-CM

## 2023-07-25 DIAGNOSIS — G894 Chronic pain syndrome: Secondary | ICD-10-CM

## 2023-07-25 DIAGNOSIS — J9621 Acute and chronic respiratory failure with hypoxia: Secondary | ICD-10-CM

## 2023-07-25 DIAGNOSIS — I2694 Multiple subsegmental pulmonary emboli without acute cor pulmonale: Secondary | ICD-10-CM

## 2023-07-25 DIAGNOSIS — J449 Chronic obstructive pulmonary disease, unspecified: Secondary | ICD-10-CM

## 2023-07-25 DIAGNOSIS — C3491 Malignant neoplasm of unspecified part of right bronchus or lung: Secondary | ICD-10-CM

## 2023-07-26 ENCOUNTER — Institutional Professional Consult (permissible substitution) (HOSPITAL_COMMUNITY): Payer: Self-pay

## 2023-07-26 DIAGNOSIS — C3491 Malignant neoplasm of unspecified part of right bronchus or lung: Secondary | ICD-10-CM

## 2023-07-26 DIAGNOSIS — J9621 Acute and chronic respiratory failure with hypoxia: Secondary | ICD-10-CM

## 2023-07-26 DIAGNOSIS — I2694 Multiple subsegmental pulmonary emboli without acute cor pulmonale: Secondary | ICD-10-CM

## 2023-07-26 DIAGNOSIS — J449 Chronic obstructive pulmonary disease, unspecified: Secondary | ICD-10-CM

## 2023-07-26 DIAGNOSIS — G894 Chronic pain syndrome: Secondary | ICD-10-CM

## 2023-07-26 LAB — BODY FLUID CELL COUNT WITH DIFFERENTIAL
Eos, Fluid: 1 %
Lymphs, Fluid: 46 %
Monocyte-Macrophage-Serous Fluid: 3 % — ABNORMAL LOW (ref 50–90)
Neutrophil Count, Fluid: 50 % — ABNORMAL HIGH (ref 0–25)
Total Nucleated Cell Count, Fluid: 410 uL (ref 0–1000)

## 2023-07-26 LAB — COMPREHENSIVE METABOLIC PANEL WITH GFR
ALT: 32 U/L (ref 0–44)
AST: 25 U/L (ref 15–41)
Albumin: 2.7 g/dL — ABNORMAL LOW (ref 3.5–5.0)
Alkaline Phosphatase: 57 U/L (ref 38–126)
Anion gap: 6 (ref 5–15)
BUN: 18 mg/dL (ref 8–23)
CO2: 35 mmol/L — ABNORMAL HIGH (ref 22–32)
Calcium: 8.1 mg/dL — ABNORMAL LOW (ref 8.9–10.3)
Chloride: 96 mmol/L — ABNORMAL LOW (ref 98–111)
Creatinine, Ser: 0.81 mg/dL (ref 0.61–1.24)
GFR, Estimated: >60 mL/min (ref >60)
Glucose, Bld: 138 mg/dL — ABNORMAL HIGH (ref 70–99)
Potassium: 4.8 mmol/L (ref 3.5–5.1)
Sodium: 137 mmol/L (ref 135–145)
Total Bilirubin: 0.5 mg/dL (ref 0.0–1.2)
Total Protein: 5.3 g/dL — ABNORMAL LOW (ref 6.5–8.1)

## 2023-07-26 LAB — CBC
HCT: 33.5 % — ABNORMAL LOW (ref 39.0–52.0)
Hemoglobin: 9.5 g/dL — ABNORMAL LOW (ref 13.0–17.0)
MCH: 24.5 pg — ABNORMAL LOW (ref 26.0–34.0)
MCHC: 28.4 g/dL — ABNORMAL LOW (ref 30.0–36.0)
MCV: 86.3 fL (ref 80.0–100.0)
Platelets: 202 10*3/uL (ref 150–400)
RBC: 3.88 MIL/uL — ABNORMAL LOW (ref 4.22–5.81)
RDW: 17.4 % — ABNORMAL HIGH (ref 11.5–15.5)
WBC: 10.5 10*3/uL (ref 4.0–10.5)
nRBC: 0.3 % — ABNORMAL HIGH (ref 0.0–0.2)

## 2023-07-26 LAB — PROTEIN, PLEURAL OR PERITONEAL FLUID: Total protein, fluid: <3 g/dL

## 2023-07-26 LAB — LACTATE DEHYDROGENASE, PLEURAL OR PERITONEAL FLUID: LD, Fluid: 254 U/L — ABNORMAL HIGH (ref 3–23)

## 2023-07-26 LAB — LACTATE DEHYDROGENASE: LDH: 375 U/L — ABNORMAL HIGH (ref 98–192)

## 2023-07-26 LAB — GLUCOSE, PLEURAL OR PERITONEAL FLUID: Glucose, Fluid: 138 mg/dL

## 2023-07-26 MED ORDER — LIDOCAINE HCL (PF) 1 % IJ SOLN
10.0000 mL | Freq: Once | INTRAMUSCULAR | Status: AC
  Administered 2023-07-26: 10 mL

## 2023-07-26 NOTE — Procedures (Addendum)
 PROCEDURE SUMMARY:  Successful image-guided right thoracentesis. Yielded .050 liters of clear, red/ blood-laden pleural fluid. Patient tolerated procedure well. EBL: Zero No immediate complications.  Specimen was sent for labs. Post procedure CXR is pending  Please see imaging section of Epic for full dictation.  [facility] LABOR Sina Lucchesi [name] 07/26/2023 11:05 AM

## 2023-07-27 DIAGNOSIS — I2694 Multiple subsegmental pulmonary emboli without acute cor pulmonale: Secondary | ICD-10-CM

## 2023-07-27 DIAGNOSIS — J449 Chronic obstructive pulmonary disease, unspecified: Secondary | ICD-10-CM

## 2023-07-27 DIAGNOSIS — G894 Chronic pain syndrome: Secondary | ICD-10-CM

## 2023-07-27 DIAGNOSIS — J9621 Acute and chronic respiratory failure with hypoxia: Secondary | ICD-10-CM

## 2023-07-27 DIAGNOSIS — C3491 Malignant neoplasm of unspecified part of right bronchus or lung: Secondary | ICD-10-CM

## 2023-07-27 LAB — PATHOLOGIST SMEAR REVIEW

## 2023-07-28 DIAGNOSIS — J449 Chronic obstructive pulmonary disease, unspecified: Secondary | ICD-10-CM

## 2023-07-28 DIAGNOSIS — G894 Chronic pain syndrome: Secondary | ICD-10-CM

## 2023-07-28 DIAGNOSIS — C3491 Malignant neoplasm of unspecified part of right bronchus or lung: Secondary | ICD-10-CM

## 2023-07-28 DIAGNOSIS — J9621 Acute and chronic respiratory failure with hypoxia: Secondary | ICD-10-CM

## 2023-07-28 DIAGNOSIS — I2694 Multiple subsegmental pulmonary emboli without acute cor pulmonale: Secondary | ICD-10-CM

## 2023-07-28 NOTE — Telephone Encounter (Signed)
 Forms have been placed in Dr. Leatha folder to be completed.

## 2023-07-29 DIAGNOSIS — G894 Chronic pain syndrome: Secondary | ICD-10-CM

## 2023-07-29 DIAGNOSIS — C3491 Malignant neoplasm of unspecified part of right bronchus or lung: Secondary | ICD-10-CM

## 2023-07-29 DIAGNOSIS — J9621 Acute and chronic respiratory failure with hypoxia: Secondary | ICD-10-CM

## 2023-07-29 DIAGNOSIS — J449 Chronic obstructive pulmonary disease, unspecified: Secondary | ICD-10-CM

## 2023-07-29 LAB — LACTATE DEHYDROGENASE: LDH: 374 U/L — ABNORMAL HIGH (ref 98–192)

## 2023-07-30 DIAGNOSIS — G894 Chronic pain syndrome: Secondary | ICD-10-CM

## 2023-07-30 DIAGNOSIS — C3491 Malignant neoplasm of unspecified part of right bronchus or lung: Secondary | ICD-10-CM

## 2023-07-30 DIAGNOSIS — J9621 Acute and chronic respiratory failure with hypoxia: Secondary | ICD-10-CM

## 2023-07-30 DIAGNOSIS — J449 Chronic obstructive pulmonary disease, unspecified: Secondary | ICD-10-CM

## 2023-07-30 LAB — BODY FLUID CULTURE W GRAM STAIN: Culture: NO GROWTH

## 2023-07-31 ENCOUNTER — Other Ambulatory Visit: Payer: Self-pay | Admitting: Pulmonary Disease

## 2023-07-31 DIAGNOSIS — J449 Chronic obstructive pulmonary disease, unspecified: Secondary | ICD-10-CM

## 2023-07-31 DIAGNOSIS — G894 Chronic pain syndrome: Secondary | ICD-10-CM

## 2023-07-31 DIAGNOSIS — C3491 Malignant neoplasm of unspecified part of right bronchus or lung: Secondary | ICD-10-CM

## 2023-07-31 DIAGNOSIS — J9621 Acute and chronic respiratory failure with hypoxia: Secondary | ICD-10-CM

## 2023-07-31 LAB — CBC
HCT: 32.2 % — ABNORMAL LOW (ref 39.0–52.0)
Hemoglobin: 9.4 g/dL — ABNORMAL LOW (ref 13.0–17.0)
MCH: 24.6 pg — ABNORMAL LOW (ref 26.0–34.0)
MCHC: 29.2 g/dL — ABNORMAL LOW (ref 30.0–36.0)
MCV: 84.3 fL (ref 80.0–100.0)
Platelets: 181 10*3/uL (ref 150–400)
RBC: 3.82 MIL/uL — ABNORMAL LOW (ref 4.22–5.81)
RDW: 17.8 % — ABNORMAL HIGH (ref 11.5–15.5)
WBC: 7.3 10*3/uL (ref 4.0–10.5)
nRBC: 0 % (ref 0.0–0.2)

## 2023-07-31 LAB — BASIC METABOLIC PANEL WITH GFR
Anion gap: 5 (ref 5–15)
BUN: 20 mg/dL (ref 8–23)
CO2: 41 mmol/L — ABNORMAL HIGH (ref 22–32)
Calcium: 8.5 mg/dL — ABNORMAL LOW (ref 8.9–10.3)
Chloride: 93 mmol/L — ABNORMAL LOW (ref 98–111)
Creatinine, Ser: 0.73 mg/dL (ref 0.61–1.24)
GFR, Estimated: >60 mL/min (ref >60)
Glucose, Bld: 107 mg/dL — ABNORMAL HIGH (ref 70–99)
Potassium: 4.1 mmol/L (ref 3.5–5.1)
Sodium: 139 mmol/L (ref 135–145)

## 2023-07-31 LAB — MAGNESIUM: Magnesium: 2 mg/dL (ref 1.7–2.4)

## 2023-08-01 DIAGNOSIS — J449 Chronic obstructive pulmonary disease, unspecified: Secondary | ICD-10-CM

## 2023-08-01 DIAGNOSIS — G894 Chronic pain syndrome: Secondary | ICD-10-CM

## 2023-08-01 DIAGNOSIS — J9621 Acute and chronic respiratory failure with hypoxia: Secondary | ICD-10-CM

## 2023-08-01 DIAGNOSIS — C3491 Malignant neoplasm of unspecified part of right bronchus or lung: Secondary | ICD-10-CM

## 2023-08-02 DIAGNOSIS — J9621 Acute and chronic respiratory failure with hypoxia: Secondary | ICD-10-CM

## 2023-08-02 DIAGNOSIS — J449 Chronic obstructive pulmonary disease, unspecified: Secondary | ICD-10-CM

## 2023-08-02 DIAGNOSIS — C3491 Malignant neoplasm of unspecified part of right bronchus or lung: Secondary | ICD-10-CM

## 2023-08-02 DIAGNOSIS — G894 Chronic pain syndrome: Secondary | ICD-10-CM

## 2023-08-02 LAB — CBC
HCT: 31.2 % — ABNORMAL LOW (ref 39.0–52.0)
Hemoglobin: 8.9 g/dL — ABNORMAL LOW (ref 13.0–17.0)
MCH: 24.3 pg — ABNORMAL LOW (ref 26.0–34.0)
MCHC: 28.5 g/dL — ABNORMAL LOW (ref 30.0–36.0)
MCV: 85 fL (ref 80.0–100.0)
Platelets: 188 10*3/uL (ref 150–400)
RBC: 3.67 MIL/uL — ABNORMAL LOW (ref 4.22–5.81)
RDW: 17.9 % — ABNORMAL HIGH (ref 11.5–15.5)
WBC: 7.1 10*3/uL (ref 4.0–10.5)
nRBC: 0 % (ref 0.0–0.2)

## 2023-08-02 LAB — COMPREHENSIVE METABOLIC PANEL WITH GFR
ALT: 24 U/L (ref 0–44)
AST: 19 U/L (ref 15–41)
Albumin: 2.6 g/dL — ABNORMAL LOW (ref 3.5–5.0)
Alkaline Phosphatase: 55 U/L (ref 38–126)
Anion gap: 3 — ABNORMAL LOW (ref 5–15)
BUN: 16 mg/dL (ref 8–23)
CO2: 41 mmol/L — ABNORMAL HIGH (ref 22–32)
Calcium: 8.6 mg/dL — ABNORMAL LOW (ref 8.9–10.3)
Chloride: 95 mmol/L — ABNORMAL LOW (ref 98–111)
Creatinine, Ser: 0.82 mg/dL (ref 0.61–1.24)
GFR, Estimated: >60 mL/min (ref >60)
Glucose, Bld: 109 mg/dL — ABNORMAL HIGH (ref 70–99)
Potassium: 3.9 mmol/L (ref 3.5–5.1)
Sodium: 139 mmol/L (ref 135–145)
Total Bilirubin: 0.5 mg/dL (ref 0.0–1.2)
Total Protein: 5.3 g/dL — ABNORMAL LOW (ref 6.5–8.1)

## 2023-08-03 ENCOUNTER — Institutional Professional Consult (permissible substitution) (HOSPITAL_COMMUNITY): Payer: Self-pay

## 2023-08-03 DIAGNOSIS — C3491 Malignant neoplasm of unspecified part of right bronchus or lung: Secondary | ICD-10-CM

## 2023-08-03 DIAGNOSIS — G894 Chronic pain syndrome: Secondary | ICD-10-CM

## 2023-08-03 DIAGNOSIS — J9621 Acute and chronic respiratory failure with hypoxia: Secondary | ICD-10-CM

## 2023-08-03 DIAGNOSIS — J449 Chronic obstructive pulmonary disease, unspecified: Secondary | ICD-10-CM

## 2023-08-03 NOTE — Telephone Encounter (Signed)
 Forms received by front staff and have been mailed to patient.

## 2023-08-04 ENCOUNTER — Institutional Professional Consult (permissible substitution) (HOSPITAL_COMMUNITY): Payer: Self-pay

## 2023-08-04 DIAGNOSIS — C3491 Malignant neoplasm of unspecified part of right bronchus or lung: Secondary | ICD-10-CM

## 2023-08-04 DIAGNOSIS — J9621 Acute and chronic respiratory failure with hypoxia: Secondary | ICD-10-CM

## 2023-08-04 DIAGNOSIS — G894 Chronic pain syndrome: Secondary | ICD-10-CM

## 2023-08-04 DIAGNOSIS — J449 Chronic obstructive pulmonary disease, unspecified: Secondary | ICD-10-CM

## 2023-08-08 LAB — COMPREHENSIVE METABOLIC PANEL WITH GFR
ALT: 21 U/L (ref 0–44)
AST: 18 U/L (ref 15–41)
Albumin: 2.5 g/dL — ABNORMAL LOW (ref 3.5–5.0)
Alkaline Phosphatase: 59 U/L (ref 38–126)
Anion gap: 11 (ref 5–15)
BUN: 15 mg/dL (ref 8–23)
CO2: 36 mmol/L — ABNORMAL HIGH (ref 22–32)
Calcium: 8.7 mg/dL — ABNORMAL LOW (ref 8.9–10.3)
Chloride: 91 mmol/L — ABNORMAL LOW (ref 98–111)
Creatinine, Ser: 0.85 mg/dL (ref 0.61–1.24)
GFR, Estimated: >60 mL/min (ref >60)
Glucose, Bld: 104 mg/dL — ABNORMAL HIGH (ref 70–99)
Potassium: 3.8 mmol/L (ref 3.5–5.1)
Sodium: 138 mmol/L (ref 135–145)
Total Bilirubin: 0.7 mg/dL (ref 0.0–1.2)
Total Protein: 5.2 g/dL — ABNORMAL LOW (ref 6.5–8.1)

## 2023-08-08 LAB — CBC
HCT: 30.1 % — ABNORMAL LOW (ref 39.0–52.0)
Hemoglobin: 9.1 g/dL — ABNORMAL LOW (ref 13.0–17.0)
MCH: 25.4 pg — ABNORMAL LOW (ref 26.0–34.0)
MCHC: 30.2 g/dL (ref 30.0–36.0)
MCV: 84.1 fL (ref 80.0–100.0)
Platelets: 223 K/uL (ref 150–400)
RBC: 3.58 MIL/uL — ABNORMAL LOW (ref 4.22–5.81)
RDW: 17.9 % — ABNORMAL HIGH (ref 11.5–15.5)
WBC: 4.8 K/uL (ref 4.0–10.5)
nRBC: 0 % (ref 0.0–0.2)

## 2023-08-09 DIAGNOSIS — J449 Chronic obstructive pulmonary disease, unspecified: Secondary | ICD-10-CM

## 2023-08-09 DIAGNOSIS — G894 Chronic pain syndrome: Secondary | ICD-10-CM

## 2023-08-09 DIAGNOSIS — J9621 Acute and chronic respiratory failure with hypoxia: Secondary | ICD-10-CM

## 2023-08-09 DIAGNOSIS — C3491 Malignant neoplasm of unspecified part of right bronchus or lung: Secondary | ICD-10-CM

## 2023-08-09 LAB — COMPREHENSIVE METABOLIC PANEL WITH GFR
ALT: 20 U/L (ref 0–44)
AST: 17 U/L (ref 15–41)
Albumin: 2.6 g/dL — ABNORMAL LOW (ref 3.5–5.0)
Alkaline Phosphatase: 63 U/L (ref 38–126)
Anion gap: 8 (ref 5–15)
BUN: 13 mg/dL (ref 8–23)
CO2: 36 mmol/L — ABNORMAL HIGH (ref 22–32)
Calcium: 8.7 mg/dL — ABNORMAL LOW (ref 8.9–10.3)
Chloride: 96 mmol/L — ABNORMAL LOW (ref 98–111)
Creatinine, Ser: 0.74 mg/dL (ref 0.61–1.24)
GFR, Estimated: >60 mL/min (ref >60)
Glucose, Bld: 114 mg/dL — ABNORMAL HIGH (ref 70–99)
Potassium: 3.7 mmol/L (ref 3.5–5.1)
Sodium: 140 mmol/L (ref 135–145)
Total Bilirubin: 0.6 mg/dL (ref 0.0–1.2)
Total Protein: 5.4 g/dL — ABNORMAL LOW (ref 6.5–8.1)

## 2023-08-09 LAB — CBC
HCT: 31.2 % — ABNORMAL LOW (ref 39.0–52.0)
Hemoglobin: 9 g/dL — ABNORMAL LOW (ref 13.0–17.0)
MCH: 24.5 pg — ABNORMAL LOW (ref 26.0–34.0)
MCHC: 28.8 g/dL — ABNORMAL LOW (ref 30.0–36.0)
MCV: 85 fL (ref 80.0–100.0)
Platelets: 239 K/uL (ref 150–400)
RBC: 3.67 MIL/uL — ABNORMAL LOW (ref 4.22–5.81)
RDW: 17.9 % — ABNORMAL HIGH (ref 11.5–15.5)
WBC: 5 K/uL (ref 4.0–10.5)
nRBC: 0 % (ref 0.0–0.2)

## 2023-08-10 DIAGNOSIS — G894 Chronic pain syndrome: Secondary | ICD-10-CM

## 2023-08-10 DIAGNOSIS — C3491 Malignant neoplasm of unspecified part of right bronchus or lung: Secondary | ICD-10-CM

## 2023-08-10 DIAGNOSIS — J449 Chronic obstructive pulmonary disease, unspecified: Secondary | ICD-10-CM

## 2023-08-10 DIAGNOSIS — J9621 Acute and chronic respiratory failure with hypoxia: Secondary | ICD-10-CM

## 2023-09-13 ENCOUNTER — Encounter: Payer: Self-pay | Admitting: Internal Medicine

## 2023-09-16 ENCOUNTER — Telehealth: Payer: Self-pay | Admitting: Medical Oncology

## 2023-09-16 NOTE — Telephone Encounter (Signed)
 Expressed our condolences to [place] .

## 2023-09-23 ENCOUNTER — Ambulatory Visit: Admitting: Pulmonary Disease

## 2023-10-04 DEATH — deceased

## 2023-10-22 ENCOUNTER — Encounter: Payer: Self-pay | Admitting: Internal Medicine

## 2023-10-22 ENCOUNTER — Encounter: Payer: Self-pay | Admitting: Physician Assistant
